# Patient Record
Sex: Male | Born: 1960 | Race: Black or African American | Hispanic: No | Marital: Single | Smoking: Never smoker
Health system: Southern US, Community
[De-identification: ages and names within clinical notes are randomized; demographics above are authoritative.]

## PROBLEM LIST (undated history)

## (undated) DIAGNOSIS — M545 Low back pain, unspecified: Secondary | ICD-10-CM

## (undated) DIAGNOSIS — I219 Acute myocardial infarction, unspecified: Secondary | ICD-10-CM

## (undated) DIAGNOSIS — R059 Cough, unspecified: Secondary | ICD-10-CM

## (undated) DIAGNOSIS — E785 Hyperlipidemia, unspecified: Secondary | ICD-10-CM

## (undated) DIAGNOSIS — K259 Gastric ulcer, unspecified as acute or chronic, without hemorrhage or perforation: Secondary | ICD-10-CM

## (undated) DIAGNOSIS — F32A Depression, unspecified: Secondary | ICD-10-CM

## (undated) DIAGNOSIS — R05 Cough: Secondary | ICD-10-CM

## (undated) DIAGNOSIS — E119 Type 2 diabetes mellitus without complications: Secondary | ICD-10-CM

## (undated) DIAGNOSIS — Z87898 Personal history of other specified conditions: Secondary | ICD-10-CM

## (undated) DIAGNOSIS — I509 Heart failure, unspecified: Secondary | ICD-10-CM

## (undated) DIAGNOSIS — Z973 Presence of spectacles and contact lenses: Secondary | ICD-10-CM

## (undated) DIAGNOSIS — I10 Essential (primary) hypertension: Secondary | ICD-10-CM

## (undated) DIAGNOSIS — I5030 Unspecified diastolic (congestive) heart failure: Secondary | ICD-10-CM

## (undated) DIAGNOSIS — I82409 Acute embolism and thrombosis of unspecified deep veins of unspecified lower extremity: Secondary | ICD-10-CM

## (undated) DIAGNOSIS — J45909 Unspecified asthma, uncomplicated: Secondary | ICD-10-CM

## (undated) DIAGNOSIS — F419 Anxiety disorder, unspecified: Secondary | ICD-10-CM

## (undated) DIAGNOSIS — E7800 Pure hypercholesterolemia, unspecified: Secondary | ICD-10-CM

## (undated) DIAGNOSIS — M069 Rheumatoid arthritis, unspecified: Secondary | ICD-10-CM

## (undated) DIAGNOSIS — G629 Polyneuropathy, unspecified: Secondary | ICD-10-CM

## (undated) DIAGNOSIS — T39395A Adverse effect of other nonsteroidal anti-inflammatory drugs [NSAID], initial encounter: Secondary | ICD-10-CM

## (undated) DIAGNOSIS — K279 Peptic ulcer, site unspecified, unspecified as acute or chronic, without hemorrhage or perforation: Secondary | ICD-10-CM

## (undated) DIAGNOSIS — G40409 Other generalized epilepsy and epileptic syndromes, not intractable, without status epilepticus: Secondary | ICD-10-CM

## (undated) DIAGNOSIS — K219 Gastro-esophageal reflux disease without esophagitis: Secondary | ICD-10-CM

## (undated) DIAGNOSIS — D649 Anemia, unspecified: Secondary | ICD-10-CM

## (undated) DIAGNOSIS — J449 Chronic obstructive pulmonary disease, unspecified: Secondary | ICD-10-CM

## (undated) DIAGNOSIS — M549 Dorsalgia, unspecified: Secondary | ICD-10-CM

## (undated) DIAGNOSIS — J3501 Chronic tonsillitis: Secondary | ICD-10-CM

## (undated) DIAGNOSIS — G8929 Other chronic pain: Secondary | ICD-10-CM

## (undated) DIAGNOSIS — G709 Myoneural disorder, unspecified: Secondary | ICD-10-CM

## (undated) DIAGNOSIS — R51 Headache: Secondary | ICD-10-CM

## (undated) DIAGNOSIS — M5020 Other cervical disc displacement, unspecified cervical region: Secondary | ICD-10-CM

## (undated) DIAGNOSIS — R350 Frequency of micturition: Secondary | ICD-10-CM

## (undated) DIAGNOSIS — F329 Major depressive disorder, single episode, unspecified: Secondary | ICD-10-CM

## (undated) DIAGNOSIS — S61411A Laceration without foreign body of right hand, initial encounter: Secondary | ICD-10-CM

## (undated) DIAGNOSIS — S61519A Laceration without foreign body of unspecified wrist, initial encounter: Secondary | ICD-10-CM

## (undated) DIAGNOSIS — R0602 Shortness of breath: Secondary | ICD-10-CM

## (undated) DIAGNOSIS — F319 Bipolar disorder, unspecified: Secondary | ICD-10-CM

## (undated) DIAGNOSIS — I2510 Atherosclerotic heart disease of native coronary artery without angina pectoris: Secondary | ICD-10-CM

## (undated) HISTORY — DX: Acute myocardial infarction, unspecified: I21.9

## (undated) HISTORY — DX: Laceration without foreign body of right hand, initial encounter: S61.411A

## (undated) HISTORY — PX: CARPAL TUNNEL RELEASE: SHX101

## (undated) HISTORY — DX: Laceration without foreign body of unspecified wrist, initial encounter: S61.519A

## (undated) HISTORY — DX: Peptic ulcer, site unspecified, unspecified as acute or chronic, without hemorrhage or perforation: K27.9

## (undated) HISTORY — DX: Other chronic pain: G89.29

## (undated) HISTORY — PX: MULTIPLE TOOTH EXTRACTIONS: SHX2053

## (undated) HISTORY — PX: FRACTURE SURGERY: SHX138

## (undated) HISTORY — PX: BACK SURGERY: SHX140

## (undated) HISTORY — PX: LUMBAR DISC SURGERY: SHX700

## (undated) HISTORY — DX: Dorsalgia, unspecified: M54.9

## (undated) HISTORY — DX: Chronic tonsillitis: J35.01

## (undated) HISTORY — PX: CARDIAC CATHETERIZATION: SHX172

---
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
## 1997-07-25 ENCOUNTER — Ambulatory Visit (HOSPITAL_COMMUNITY): Admission: RE | Admit: 1997-07-25 | Discharge: 1997-07-25 | Payer: Self-pay | Admitting: Neurosurgery

## 1997-08-08 ENCOUNTER — Emergency Department (HOSPITAL_COMMUNITY): Admission: EM | Admit: 1997-08-08 | Discharge: 1997-08-08 | Payer: Self-pay | Admitting: Emergency Medicine

## 1997-08-08 ENCOUNTER — Encounter: Admission: RE | Admit: 1997-08-08 | Discharge: 1997-11-06 | Payer: Self-pay | Admitting: Anesthesiology

## 1997-08-16 ENCOUNTER — Emergency Department (HOSPITAL_COMMUNITY): Admission: EM | Admit: 1997-08-16 | Discharge: 1997-08-16 | Payer: Self-pay | Admitting: Internal Medicine

## 1997-08-23 ENCOUNTER — Encounter (HOSPITAL_COMMUNITY): Admission: RE | Admit: 1997-08-23 | Discharge: 1997-08-23 | Payer: Self-pay | Admitting: Internal Medicine

## 2000-02-23 DIAGNOSIS — I2510 Atherosclerotic heart disease of native coronary artery without angina pectoris: Secondary | ICD-10-CM

## 2000-02-23 HISTORY — PX: CORONARY ARTERY BYPASS GRAFT: SHX141

## 2000-02-23 HISTORY — DX: Atherosclerotic heart disease of native coronary artery without angina pectoris: I25.10

## 2000-08-22 ENCOUNTER — Emergency Department (HOSPITAL_COMMUNITY): Admission: EM | Admit: 2000-08-22 | Discharge: 2000-08-23 | Payer: Self-pay | Admitting: *Deleted

## 2001-08-16 ENCOUNTER — Encounter: Payer: Self-pay | Admitting: Emergency Medicine

## 2001-08-16 ENCOUNTER — Emergency Department (HOSPITAL_COMMUNITY): Admission: EM | Admit: 2001-08-16 | Discharge: 2001-08-16 | Payer: Self-pay | Admitting: Emergency Medicine

## 2001-08-18 ENCOUNTER — Emergency Department (HOSPITAL_COMMUNITY): Admission: EM | Admit: 2001-08-18 | Discharge: 2001-08-19 | Payer: Self-pay | Admitting: Emergency Medicine

## 2001-08-27 ENCOUNTER — Inpatient Hospital Stay (HOSPITAL_COMMUNITY): Admission: EM | Admit: 2001-08-27 | Discharge: 2001-09-11 | Payer: Self-pay | Admitting: Emergency Medicine

## 2001-08-27 ENCOUNTER — Encounter: Payer: Self-pay | Admitting: Emergency Medicine

## 2001-08-28 ENCOUNTER — Encounter: Payer: Self-pay | Admitting: *Deleted

## 2001-08-31 ENCOUNTER — Encounter: Payer: Self-pay | Admitting: Cardiology

## 2001-09-01 ENCOUNTER — Encounter: Payer: Self-pay | Admitting: *Deleted

## 2001-09-06 ENCOUNTER — Encounter: Payer: Self-pay | Admitting: Thoracic Surgery (Cardiothoracic Vascular Surgery)

## 2001-09-07 ENCOUNTER — Encounter: Payer: Self-pay | Admitting: Thoracic Surgery (Cardiothoracic Vascular Surgery)

## 2001-09-08 ENCOUNTER — Encounter: Payer: Self-pay | Admitting: Thoracic Surgery (Cardiothoracic Vascular Surgery)

## 2001-09-22 ENCOUNTER — Encounter: Admission: RE | Admit: 2001-09-22 | Discharge: 2001-11-21 | Payer: Self-pay

## 2001-10-26 ENCOUNTER — Encounter: Payer: Self-pay | Admitting: Emergency Medicine

## 2001-10-26 ENCOUNTER — Inpatient Hospital Stay (HOSPITAL_COMMUNITY): Admission: EM | Admit: 2001-10-26 | Discharge: 2001-10-27 | Payer: Self-pay | Admitting: Emergency Medicine

## 2001-10-27 ENCOUNTER — Inpatient Hospital Stay (HOSPITAL_COMMUNITY): Admission: EM | Admit: 2001-10-27 | Discharge: 2001-11-14 | Payer: Self-pay | Admitting: Psychiatry

## 2001-10-31 ENCOUNTER — Encounter: Payer: Self-pay | Admitting: Emergency Medicine

## 2001-10-31 ENCOUNTER — Emergency Department (HOSPITAL_COMMUNITY): Admission: EM | Admit: 2001-10-31 | Discharge: 2001-10-31 | Payer: Self-pay | Admitting: Emergency Medicine

## 2001-11-22 ENCOUNTER — Encounter: Admission: RE | Admit: 2001-11-22 | Discharge: 2002-02-20 | Payer: Self-pay

## 2002-01-15 ENCOUNTER — Inpatient Hospital Stay (HOSPITAL_COMMUNITY): Admission: EM | Admit: 2002-01-15 | Discharge: 2002-01-16 | Payer: Self-pay | Admitting: Cardiology

## 2002-01-16 ENCOUNTER — Encounter: Payer: Self-pay | Admitting: Cardiology

## 2002-02-09 ENCOUNTER — Ambulatory Visit (HOSPITAL_COMMUNITY): Admission: RE | Admit: 2002-02-09 | Discharge: 2002-02-09 | Payer: Self-pay | Admitting: Neurosurgery

## 2002-02-09 ENCOUNTER — Encounter: Payer: Self-pay | Admitting: Neurosurgery

## 2002-04-11 ENCOUNTER — Encounter: Payer: Self-pay | Admitting: Neurosurgery

## 2002-04-16 ENCOUNTER — Encounter: Payer: Self-pay | Admitting: Neurosurgery

## 2002-04-16 ENCOUNTER — Inpatient Hospital Stay (HOSPITAL_COMMUNITY): Admission: RE | Admit: 2002-04-16 | Discharge: 2002-04-19 | Payer: Self-pay | Admitting: Neurosurgery

## 2002-05-02 ENCOUNTER — Emergency Department (HOSPITAL_COMMUNITY): Admission: EM | Admit: 2002-05-02 | Discharge: 2002-05-03 | Payer: Self-pay | Admitting: Emergency Medicine

## 2002-05-02 ENCOUNTER — Encounter: Payer: Self-pay | Admitting: Emergency Medicine

## 2002-05-02 ENCOUNTER — Emergency Department (HOSPITAL_COMMUNITY): Admission: EM | Admit: 2002-05-02 | Discharge: 2002-05-02 | Payer: Self-pay | Admitting: Emergency Medicine

## 2002-09-26 ENCOUNTER — Encounter: Payer: Self-pay | Admitting: Emergency Medicine

## 2002-09-26 ENCOUNTER — Emergency Department (HOSPITAL_COMMUNITY): Admission: EM | Admit: 2002-09-26 | Discharge: 2002-09-27 | Payer: Self-pay | Admitting: Emergency Medicine

## 2002-09-26 ENCOUNTER — Emergency Department (HOSPITAL_COMMUNITY): Admission: EM | Admit: 2002-09-26 | Discharge: 2002-09-26 | Payer: Self-pay | Admitting: Emergency Medicine

## 2002-09-28 ENCOUNTER — Inpatient Hospital Stay (HOSPITAL_COMMUNITY): Admission: EM | Admit: 2002-09-28 | Discharge: 2002-10-10 | Payer: Self-pay | Admitting: Psychiatry

## 2002-10-11 ENCOUNTER — Emergency Department (HOSPITAL_COMMUNITY): Admission: EM | Admit: 2002-10-11 | Discharge: 2002-10-11 | Payer: Self-pay | Admitting: Emergency Medicine

## 2002-10-13 ENCOUNTER — Emergency Department (HOSPITAL_COMMUNITY): Admission: EM | Admit: 2002-10-13 | Discharge: 2002-10-13 | Payer: Self-pay | Admitting: Emergency Medicine

## 2002-11-11 ENCOUNTER — Encounter: Payer: Self-pay | Admitting: Emergency Medicine

## 2002-11-12 ENCOUNTER — Inpatient Hospital Stay (HOSPITAL_COMMUNITY): Admission: EM | Admit: 2002-11-12 | Discharge: 2002-11-22 | Payer: Self-pay | Admitting: Psychiatry

## 2002-11-24 ENCOUNTER — Emergency Department (HOSPITAL_COMMUNITY): Admission: EM | Admit: 2002-11-24 | Discharge: 2002-11-25 | Payer: Self-pay | Admitting: Emergency Medicine

## 2002-11-25 ENCOUNTER — Encounter: Payer: Self-pay | Admitting: *Deleted

## 2002-11-25 ENCOUNTER — Emergency Department (HOSPITAL_COMMUNITY): Admission: EM | Admit: 2002-11-25 | Discharge: 2002-11-25 | Payer: Self-pay | Admitting: Emergency Medicine

## 2003-05-31 ENCOUNTER — Emergency Department (HOSPITAL_COMMUNITY): Admission: EM | Admit: 2003-05-31 | Discharge: 2003-05-31 | Payer: Self-pay | Admitting: Emergency Medicine

## 2003-06-22 ENCOUNTER — Emergency Department (HOSPITAL_COMMUNITY): Admission: EM | Admit: 2003-06-22 | Discharge: 2003-06-22 | Payer: Self-pay | Admitting: *Deleted

## 2003-06-27 ENCOUNTER — Ambulatory Visit (HOSPITAL_COMMUNITY): Admission: RE | Admit: 2003-06-27 | Discharge: 2003-06-27 | Payer: Self-pay | Admitting: Orthopaedic Surgery

## 2003-07-22 ENCOUNTER — Encounter (HOSPITAL_COMMUNITY): Admission: RE | Admit: 2003-07-22 | Discharge: 2003-08-21 | Payer: Self-pay | Admitting: Cardiology

## 2003-08-28 ENCOUNTER — Emergency Department (HOSPITAL_COMMUNITY): Admission: EM | Admit: 2003-08-28 | Discharge: 2003-08-28 | Payer: Self-pay | Admitting: *Deleted

## 2003-09-04 ENCOUNTER — Ambulatory Visit (HOSPITAL_COMMUNITY): Admission: RE | Admit: 2003-09-04 | Discharge: 2003-09-04 | Payer: Self-pay | Admitting: Cardiology

## 2004-01-04 ENCOUNTER — Inpatient Hospital Stay (HOSPITAL_COMMUNITY): Admission: EM | Admit: 2004-01-04 | Discharge: 2004-01-07 | Payer: Self-pay | Admitting: Emergency Medicine

## 2004-01-09 ENCOUNTER — Ambulatory Visit: Payer: Self-pay | Admitting: *Deleted

## 2004-01-09 ENCOUNTER — Inpatient Hospital Stay (HOSPITAL_COMMUNITY): Admission: EM | Admit: 2004-01-09 | Discharge: 2004-01-13 | Payer: Self-pay | Admitting: Emergency Medicine

## 2004-01-25 ENCOUNTER — Inpatient Hospital Stay (HOSPITAL_COMMUNITY): Admission: EM | Admit: 2004-01-25 | Discharge: 2004-01-27 | Payer: Self-pay | Admitting: Emergency Medicine

## 2004-02-02 ENCOUNTER — Emergency Department (HOSPITAL_COMMUNITY): Admission: EM | Admit: 2004-02-02 | Discharge: 2004-02-02 | Payer: Self-pay | Admitting: Emergency Medicine

## 2004-02-02 ENCOUNTER — Inpatient Hospital Stay (HOSPITAL_COMMUNITY): Admission: EM | Admit: 2004-02-02 | Discharge: 2004-02-05 | Payer: Self-pay | Admitting: Emergency Medicine

## 2004-02-02 ENCOUNTER — Ambulatory Visit: Payer: Self-pay | Admitting: Internal Medicine

## 2004-02-08 ENCOUNTER — Emergency Department (HOSPITAL_COMMUNITY): Admission: EM | Admit: 2004-02-08 | Discharge: 2004-02-08 | Payer: Self-pay | Admitting: Emergency Medicine

## 2004-02-11 ENCOUNTER — Encounter: Payer: Self-pay | Admitting: *Deleted

## 2004-02-11 ENCOUNTER — Emergency Department (HOSPITAL_COMMUNITY): Admission: EM | Admit: 2004-02-11 | Discharge: 2004-02-11 | Payer: Self-pay | Admitting: Emergency Medicine

## 2004-02-12 ENCOUNTER — Inpatient Hospital Stay (HOSPITAL_COMMUNITY): Admission: EM | Admit: 2004-02-12 | Discharge: 2004-02-15 | Payer: Self-pay | Admitting: Emergency Medicine

## 2004-02-14 ENCOUNTER — Ambulatory Visit: Payer: Self-pay | Admitting: Physical Medicine & Rehabilitation

## 2005-01-28 ENCOUNTER — Emergency Department (HOSPITAL_COMMUNITY): Admission: EM | Admit: 2005-01-28 | Discharge: 2005-01-28 | Payer: Self-pay | Admitting: Emergency Medicine

## 2005-09-17 ENCOUNTER — Inpatient Hospital Stay (HOSPITAL_COMMUNITY): Admission: EM | Admit: 2005-09-17 | Discharge: 2005-10-12 | Payer: Self-pay | Admitting: Psychiatry

## 2005-09-17 ENCOUNTER — Encounter: Payer: Self-pay | Admitting: Emergency Medicine

## 2005-09-18 ENCOUNTER — Ambulatory Visit: Payer: Self-pay | Admitting: Psychiatry

## 2005-09-30 ENCOUNTER — Ambulatory Visit: Payer: Self-pay | Admitting: Internal Medicine

## 2005-09-30 ENCOUNTER — Inpatient Hospital Stay (HOSPITAL_COMMUNITY): Admission: EM | Admit: 2005-09-30 | Discharge: 2005-10-02 | Payer: Self-pay | Admitting: Emergency Medicine

## 2005-10-18 ENCOUNTER — Emergency Department (HOSPITAL_COMMUNITY): Admission: EM | Admit: 2005-10-18 | Discharge: 2005-10-18 | Payer: Self-pay | Admitting: Emergency Medicine

## 2006-06-03 ENCOUNTER — Ambulatory Visit (HOSPITAL_COMMUNITY): Admission: RE | Admit: 2006-06-03 | Discharge: 2006-06-03 | Payer: Self-pay | Admitting: Family Medicine

## 2006-12-28 ENCOUNTER — Ambulatory Visit: Payer: Self-pay | Admitting: Cardiology

## 2007-01-25 ENCOUNTER — Inpatient Hospital Stay: Payer: Self-pay | Admitting: Unknown Physician Specialty

## 2007-02-03 ENCOUNTER — Other Ambulatory Visit: Payer: Self-pay

## 2007-02-08 ENCOUNTER — Other Ambulatory Visit: Payer: Self-pay

## 2007-03-02 ENCOUNTER — Ambulatory Visit: Payer: Self-pay | Admitting: Unknown Physician Specialty

## 2007-03-28 ENCOUNTER — Other Ambulatory Visit: Payer: Self-pay

## 2007-03-28 ENCOUNTER — Inpatient Hospital Stay: Payer: Self-pay | Admitting: Unknown Physician Specialty

## 2007-07-30 ENCOUNTER — Emergency Department: Payer: Self-pay | Admitting: Emergency Medicine

## 2007-08-25 ENCOUNTER — Other Ambulatory Visit: Payer: Self-pay

## 2007-08-25 ENCOUNTER — Inpatient Hospital Stay: Payer: Self-pay | Admitting: Psychiatry

## 2007-09-06 ENCOUNTER — Encounter: Payer: Self-pay | Admitting: Emergency Medicine

## 2007-09-06 ENCOUNTER — Ambulatory Visit: Payer: Self-pay | Admitting: Psychiatry

## 2007-09-07 ENCOUNTER — Inpatient Hospital Stay (HOSPITAL_COMMUNITY): Admission: RE | Admit: 2007-09-07 | Discharge: 2007-09-25 | Payer: Self-pay | Admitting: Psychiatry

## 2007-09-26 ENCOUNTER — Inpatient Hospital Stay (HOSPITAL_COMMUNITY): Admission: EM | Admit: 2007-09-26 | Discharge: 2007-09-27 | Payer: Self-pay | Admitting: Emergency Medicine

## 2007-09-26 ENCOUNTER — Ambulatory Visit: Payer: Self-pay | Admitting: Cardiology

## 2007-10-29 ENCOUNTER — Ambulatory Visit: Payer: Self-pay | Admitting: Psychiatry

## 2007-10-29 ENCOUNTER — Other Ambulatory Visit: Payer: Self-pay

## 2007-10-29 ENCOUNTER — Inpatient Hospital Stay (HOSPITAL_COMMUNITY): Admission: EM | Admit: 2007-10-29 | Discharge: 2007-11-23 | Payer: Self-pay | Admitting: Psychiatry

## 2007-11-24 ENCOUNTER — Observation Stay (HOSPITAL_COMMUNITY): Admission: EM | Admit: 2007-11-24 | Discharge: 2007-11-27 | Payer: Self-pay | Admitting: Emergency Medicine

## 2007-11-27 ENCOUNTER — Inpatient Hospital Stay (HOSPITAL_COMMUNITY): Admission: EM | Admit: 2007-11-27 | Discharge: 2007-12-13 | Payer: Self-pay | Admitting: *Deleted

## 2007-11-27 ENCOUNTER — Ambulatory Visit: Payer: Self-pay | Admitting: *Deleted

## 2007-12-01 ENCOUNTER — Encounter: Payer: Self-pay | Admitting: *Deleted

## 2007-12-27 ENCOUNTER — Ambulatory Visit: Payer: Self-pay | Admitting: Family Medicine

## 2007-12-27 ENCOUNTER — Inpatient Hospital Stay (HOSPITAL_COMMUNITY): Admission: EM | Admit: 2007-12-27 | Discharge: 2007-12-28 | Payer: Self-pay | Admitting: Emergency Medicine

## 2007-12-28 ENCOUNTER — Ambulatory Visit: Payer: Self-pay | Admitting: Psychiatry

## 2007-12-28 ENCOUNTER — Inpatient Hospital Stay (HOSPITAL_COMMUNITY): Admission: RE | Admit: 2007-12-28 | Discharge: 2008-01-03 | Payer: Self-pay | Admitting: Psychiatry

## 2008-06-20 ENCOUNTER — Ambulatory Visit (HOSPITAL_COMMUNITY): Admission: RE | Admit: 2008-06-20 | Discharge: 2008-06-20 | Payer: Self-pay | Admitting: Family Medicine

## 2008-09-23 ENCOUNTER — Ambulatory Visit (HOSPITAL_COMMUNITY): Admission: RE | Admit: 2008-09-23 | Discharge: 2008-09-23 | Payer: Self-pay | Admitting: Family Medicine

## 2008-10-23 ENCOUNTER — Ambulatory Visit: Payer: Self-pay | Admitting: Cardiology

## 2008-10-23 ENCOUNTER — Inpatient Hospital Stay (HOSPITAL_COMMUNITY): Admission: AD | Admit: 2008-10-23 | Discharge: 2008-10-25 | Payer: Self-pay | Admitting: Cardiology

## 2008-11-08 ENCOUNTER — Other Ambulatory Visit: Payer: Self-pay | Admitting: Emergency Medicine

## 2008-11-08 ENCOUNTER — Ambulatory Visit: Payer: Self-pay | Admitting: Psychiatry

## 2008-11-09 ENCOUNTER — Inpatient Hospital Stay (HOSPITAL_COMMUNITY): Admission: RE | Admit: 2008-11-09 | Discharge: 2008-12-02 | Payer: Self-pay | Admitting: Psychiatry

## 2009-02-28 ENCOUNTER — Encounter: Payer: Self-pay | Admitting: Orthopedic Surgery

## 2009-02-28 ENCOUNTER — Ambulatory Visit (HOSPITAL_COMMUNITY): Admission: RE | Admit: 2009-02-28 | Discharge: 2009-02-28 | Payer: Self-pay | Admitting: Family Medicine

## 2009-05-05 ENCOUNTER — Ambulatory Visit: Payer: Self-pay | Admitting: Orthopedic Surgery

## 2009-05-05 DIAGNOSIS — M7580 Other shoulder lesions, unspecified shoulder: Secondary | ICD-10-CM

## 2009-05-14 ENCOUNTER — Encounter: Payer: Self-pay | Admitting: Orthopedic Surgery

## 2009-05-14 ENCOUNTER — Encounter (HOSPITAL_COMMUNITY): Admission: RE | Admit: 2009-05-14 | Discharge: 2009-06-20 | Payer: Self-pay | Admitting: Orthopedic Surgery

## 2009-06-16 ENCOUNTER — Encounter: Payer: Self-pay | Admitting: Orthopedic Surgery

## 2009-06-23 ENCOUNTER — Encounter (HOSPITAL_COMMUNITY): Admission: RE | Admit: 2009-06-23 | Discharge: 2009-07-23 | Payer: Self-pay | Admitting: Orthopedic Surgery

## 2009-07-14 ENCOUNTER — Ambulatory Visit: Payer: Self-pay | Admitting: Orthopedic Surgery

## 2009-07-14 DIAGNOSIS — G5600 Carpal tunnel syndrome, unspecified upper limb: Secondary | ICD-10-CM

## 2009-07-16 ENCOUNTER — Telehealth: Payer: Self-pay | Admitting: Orthopedic Surgery

## 2009-07-16 ENCOUNTER — Ambulatory Visit (HOSPITAL_COMMUNITY): Admission: RE | Admit: 2009-07-16 | Discharge: 2009-07-16 | Payer: Self-pay | Admitting: Orthopedic Surgery

## 2009-07-23 ENCOUNTER — Telehealth: Payer: Self-pay | Admitting: Orthopedic Surgery

## 2009-08-04 ENCOUNTER — Encounter: Payer: Self-pay | Admitting: Orthopedic Surgery

## 2009-08-06 ENCOUNTER — Telehealth: Payer: Self-pay | Admitting: Orthopedic Surgery

## 2009-08-12 ENCOUNTER — Encounter: Payer: Self-pay | Admitting: Orthopedic Surgery

## 2009-08-28 ENCOUNTER — Ambulatory Visit: Payer: Self-pay | Admitting: Orthopedic Surgery

## 2009-11-24 ENCOUNTER — Ambulatory Visit: Payer: Self-pay | Admitting: Orthopedic Surgery

## 2009-12-03 ENCOUNTER — Encounter (HOSPITAL_COMMUNITY)
Admission: RE | Admit: 2009-12-03 | Discharge: 2010-01-02 | Payer: Self-pay | Source: Home / Self Care | Admitting: Orthopedic Surgery

## 2009-12-10 ENCOUNTER — Encounter: Payer: Self-pay | Admitting: Orthopedic Surgery

## 2009-12-25 ENCOUNTER — Telehealth: Payer: Self-pay | Admitting: Orthopedic Surgery

## 2009-12-29 ENCOUNTER — Telehealth: Payer: Self-pay | Admitting: Orthopedic Surgery

## 2009-12-30 ENCOUNTER — Encounter: Payer: Self-pay | Admitting: Orthopedic Surgery

## 2010-01-01 ENCOUNTER — Telehealth: Payer: Self-pay | Admitting: Orthopedic Surgery

## 2010-01-02 ENCOUNTER — Ambulatory Visit (HOSPITAL_COMMUNITY)
Admission: RE | Admit: 2010-01-02 | Discharge: 2010-01-02 | Payer: Self-pay | Admitting: Physical Medicine and Rehabilitation

## 2010-02-09 ENCOUNTER — Telehealth: Payer: Self-pay | Admitting: Orthopedic Surgery

## 2010-03-26 NOTE — Progress Notes (Signed)
Summary: wants referral to pain management clinic  Phone Note Call from Patient   Summary of Call: Dalton [name] (02/26/2060) left a message that he is in rehab for his shoulder, but ask if you will refer him to a pain management clinic. His # [phone] Initial call taken by: Jacklynn Ganong,  December 25, 2009 2:47 PM  Follow-up for Phone Call        refer to pain management Dr Eduard Clos Follow-up by: Fuller Canada MD,  December 29, 2009 7:42 AM  Additional Follow-up for Phone Call Additional follow up Details #1::        Routed to [facility] for referral Additional Follow-up by: Jacklynn Ganong,  December 29, 2009 9:17 AM

## 2010-03-26 NOTE — Progress Notes (Signed)
Summary: getting Narcs from 2 different dr's  Phone Note Other Incoming Call back at [employer]   Summary of Call: pt getting Hydrocodone 7.5 from [place], getting Hydrocodone 7.5/500 from [facility] at the same time, also has Norco 5 rx open also  just FYI Initial call taken by: Ether Griffins,  August 06, 2009 10:57 AM  Follow-up for Phone Call        when he gets the ncs I will see him in follow up;  no meds or appts until then    Follow-up by: Fuller Canada MD,  August 06, 2009 11:10 AM  Additional Follow-up for Phone Call Additional follow up Details #1::        ok Additional Follow-up by: Ether Griffins,  August 06, 2009 11:18 AM    Additional Follow-up for Phone Call Additional follow up Details #2::    ok, thank you. Follow-up by: Cammie Sickle,  August 06, 2009 12:19 PM

## 2010-03-26 NOTE — Assessment & Plan Note (Signed)
Summary: NCS RESULTS   Visit Type:  Follow-up Referring Provider:  Dr. Janna Arch Primary Provider:  Dr. Janna Arch  CC:  ncs results uppers.  History of Present Illness: I saw Dalton [name] in the office today for a 2 month followup visit.  He is a 50 years old man with the complaint of:  left shoulder pain   Recheck after PT and injection.  could not do therapy   IMAGING: XRAYS NO ABNORMALITIES   Meds: HCTZ, Protonix, Lipitor, Ventolin inhaler, Amlodipine, Metoprolol, Leoetiracetams, Isosorbide, Plavix, Norco 7.5 for pain, helped some, not much for the pain.  NCS results for review from 08/04/09 by Dr. Gerilyn Pilgrim.  07/06/09 MRI left shoulder  IMPRESSION:   1.  No significant findings for bony impingement. 2.  Mild to moderate rotator cuff tendinopathy/tendinosis.  Shallow articular surface tear at the attachment region of the infraspinatus tendon without full-thickness tear    Allergies: 1)  ! Ibuprofen 2)  ! Neurontin 3)  ! * Ambien 4)  ! Naprosyn  Past History:  Past medical, surgical, family and social histories (including risk factors) reviewed, and no changes noted (except as noted below).  Past Medical History: Reviewed history from 05/05/2009 and no changes required. htn reflux cholesterol asthma congestive heart arthritis  Past Surgical History: Reviewed history from 05/05/2009 and no changes required. heart l spine knee hand  Family History: Reviewed history from 05/05/2009 and no changes required. Family History of Diabetes Family History Coronary Heart Disease male < 29 Family History of Arthritis  Social History: Reviewed history from 05/05/2009 and no changes required. Patient is single.  disabled no smoking no alcohol no caffeine   Impression & Recommendations:  Problem # 1:  CARPAL TUNNEL SYNDROME (ICD-354.0) Assessment New  The patient has no surgical lesion at this time he has some mild carpal tunnel findings on the nerve  conduction study and a sensory neuropathy which is best handled medically.  Currently recommend gabapentin titrated up to 400 mg t.i.d. if needed  His LEFT shoulder has tendinopathy and tendinitis with no surgical tear recommend oral pain management  Patient is returned to your care for further management.  Certainly if he injured his shoulder or his pain becomes worse would recommend reevaluation  Orders: Est. 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 Patient Level III (98119)  Medications Added to Medication List This Visit: 1)  Neurontin 100 Mg Caps (Gabapentin) .[name] Kitchen.. 1 by mouth three times a day 2)  Norco 7.5-325 Mg Tabs (Hydrocodone-acetaminophen) .[name] Kitchen.. 1 by mouth q 4 prn pain  Patient Instructions: 1)  You have 2 problems  2)  1. nerve pincehed left arm and  3)  2. Tendonitis in left shoulder  4)  Neither problem needs surgery  5)  Start gabapentin 100 mg three times a day  6)  norco 7.5 mg ev 4 hrs for pain in your shoulder after this prescription Dr Delbert Harness will write the rest  7)  Please schedule a follow-up appointment as needed. Prescriptions: NORCO 7.5-325 MG TABS (HYDROCODONE-ACETAMINOPHEN) 1 by mouth q 4 prn pain  #84 x 5   Entered and Authorized by:   Fuller Canada MD   Signed by:   Fuller Canada MD on 08/28/2009   Method used:   Print then Give to Patient   RxID:   [id number] NEURONTIN 100 MG CAPS (GABAPENTIN) 1 by mouth three times a day  #42 x 5   Entered  and Authorized by:   Fuller Canada MD   Signed by:   Fuller Canada MD on 08/28/2009   Method used:   Print then Give to Patient   RxID:   [id number]

## 2010-03-26 NOTE — Miscellaneous (Signed)
Summary: OT clinical evaluation  OT clinical evaluation   Imported By: Jacklynn Ganong 12/10/2009 13:43:05  _____________________________________________________________________  External Attachment:    Type:   Image     Comment:   External Document

## 2010-03-26 NOTE — Progress Notes (Signed)
Summary: patient requests refill on pain medication  Phone Note Call from Patient   Caller: Patient Summary of Call: Patient stopped in to request refill on Norco/Hydocodone 7.5, said Rx "runs out today."  States has seen Dr Scot Jun, as per Dr Mort Sawyers referral and due back there later in December. (We have not received Dr Ozzie Hoyle report from November consultation - called to request copy.)  Patient's pharmacy is [facility] in [place].  His ph#'s are Home [phone] or Cell [phone] Initial call taken by: Cammie Sickle,  February 09, 2010 9:29 AM  Follow-up for Phone Call        Dr. Eduard Clos will have to fill rx, since he is in with their office for pain management. Follow-up by: Ether Griffins,  February 09, 2010 11:55 AM  Additional Follow-up for Phone Call Additional follow up Details #1::        Advised patient.    Additional Follow-up by: Cammie Sickle,  February 09, 2010 1:10 PM

## 2010-03-26 NOTE — Progress Notes (Signed)
Summary: Referral to Dr. Eduard Clos for pain management.  Phone Note Outgoing Call   Call placed by: Waldon Reining,  December 29, 2009 10:45 AM Call placed to: Specialist Action Taken: Information Sent Summary of Call: I faxed a referral for this patient to Dr. Eduard Clos for pain management, as requested by the patient.

## 2010-03-26 NOTE — Medication Information (Signed)
Summary: Tax adviser   Imported By: Cammie Sickle 12/13/2009 19:12:31  _____________________________________________________________________  External Attachment:    Type:   Image     Comment:   External Document

## 2010-03-26 NOTE — Assessment & Plan Note (Signed)
Summary: left shoulder still hurting/mcr/mcd/dondiego/bsf   Visit Type:  Follow-up Referring Provider:  Dr. Janna Arch Primary Provider:  Dr. Janna Arch  CC:  left shoulder pain.  History of Present Illness: I saw Dalton [name] in the office today for a 2 month followup visit.  He is a 50 years old man with the complaint of:  left shoulder pain   he was previously treated with physical therapy and a subacromial injection although he didn't go to therapy because of increased pain His plain film showed no abnormalities he eventually had an MRI on May 15 of this year which showed no findings of bony impingement, mild to moderate rotator cuff tendinopathy with shallow articular surface tear in the region of the infraspinatus tendon.  We gave him some gabapentin 100 mg t.i.d. and Lorcet 7.5 mg which he ran out of 10 and his primary care physician gave him 10 mg of Percocet which he says helps him.  Meds: HCTZ, Protonix, Lipitor, Ventolin inhaler, Amlodipine, Metoprolol, Leoetiracetams, Isosorbide, Plavix, Norco 7.5 for pain, helped some, Percocet 10 given from PCP helped, ran out.  NCS results for review from 08/04/09 by Dr. Gerilyn Pilgrim  Has pain with popping sensation, no new injuries.     Allergies: 1)  ! Ibuprofen 2)  ! Neurontin 3)  ! * Ambien 4)  ! Naprosyn  Past History:  Past Medical History: Last updated: 05/05/2009 htn reflux cholesterol asthma congestive heart arthritis  Past Surgical History: Last updated: 05/05/2009 heart l spine knee hand  Family History: Last updated: 05/05/2009 Family History of Diabetes Family History Coronary Heart Disease male < 20 Family History of Arthritis  Social History: Last updated: 05/05/2009 Patient is single.  disabled no smoking no alcohol no caffeine  Review of Systems       musculoskeletal he complains of weakness in the LEFT upper extremity.  He had some numbness in his upper extremity had a nerve conduction  study which showed some mild carpal tunnel syndrome symptoms but that doesn't seem to be bothering him  Physical Exam  Additional Exam:  exam his overall appearance is normal although he appears older than his stated age  Has normal radial and ulnar pulse and no peripheral edema in his LEFT upper extremity are no axillary lymph nodes or cervical lymph nodes on the LEFT side  His skin is clean dry and intact no rash or ulceration in the LEFT upper extremity or neck area  Sensory-wise is normal soft touch sensation around the LEFT shoulder in a C5 distribution.  He is awake alert and oriented x3 his mood and affect is normal  As his shoulder goes he has normal passive range of motion with some crepitance.  There is no palpable tenderness over the a.c. joint posterior subacromial space or anterolateral acromion or deltoid.  He's has some weakness in the supraspinatus tendon.  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  He doesn't have a bone spur.  He doesn't have a full-thickness rotator cuff tear.  He is also not completed physical therapy.  It is unlikely that surgery will improve his situation.  He needs to rehabilitation the shoulder.  I took him through range of motion which he should be able to tolerate physical therapy.  Recommend continue Norco for pain do not take  Percocet 10 mg as a long-term solution for his shoulder pain.  I have gone over this with him at length and told him there is no surgical lesion for me to treat so he has to rehabilitation.  Orders: Physical Therapy Referral (PT) Est. Patient Level III (81191)  Patient Instructions: 1)  I have asked him to start physical therapy again.  He should come back as needed.  He can get Percocet 10 mg for Dr. Janna Arch if he wants but I've  advised him to take Norco 7.5 and gave him a prescription for one tablet q.4 #84 RIGHT 5 refills which should last him 3 months

## 2010-03-26 NOTE — Letter (Signed)
Summary: History form  History form   Imported By: Jacklynn Ganong 05/07/2009 08:21:09  _____________________________________________________________________  External Attachment:    Type:   Image     Comment:   External Document

## 2010-03-26 NOTE — Miscellaneous (Signed)
Summary: Rehab discharge  Rehab discharge   Imported By: Jacklynn Ganong 08/19/2009 11:51:58  _____________________________________________________________________  External Attachment:    Type:   Image     Comment:   External Document

## 2010-03-26 NOTE — Miscellaneous (Signed)
Summary: OT Progress note  OT Progress note   Imported By: Jacklynn Ganong 06/24/2009 12:06:12  _____________________________________________________________________  External Attachment:    Type:   Image     Comment:   External Document

## 2010-03-26 NOTE — Assessment & Plan Note (Signed)
Summary: LT SHOULDER/HAD XR [facility]/REF [place]/MEDICARE,MEDICAI/CAF   Vital Signs:  Patient profile:   50 year old male Height:      71 inches Weight:      277 pounds Pulse rate:   78 / minute Resp:     18 per minute  Vitals Entered By: Fuller Canada MD (May 05, 2009 4:27 PM)  Visit Type:  Initial Consult Referring Provider:  Dr. Janna Arch Primary Provider:  Dr. Janna Arch  CC:  left shoulder pain.  History of Present Illness: 50 year old male with LEFT shoulder pain for one year with no injury.  He reports he does not get relief with Relafen.  He has taken some Vicodin which has seemed to help.  He describes his pain as a 10 out of 10, sharp dull throbbing stabbing burning constant morning night and after exercise.  His symptoms started gradually is better with rest and worse with lifting.  He reports some bruising some numbness some swelling some locking some tingling in the LEFT upper arm.  Symptoms are primarily over the shoulder chest and upper part of his arm above the elbow.  He was referred to [place] by Dr. Delbert Harness  Xrays [facility] 02/28/09 left shoulder. review of these x-rays show that there is no obvious abnormality.  He reports indicate no acute abnormalities.  Meds: HCTZ, Protonix, Lipitor, Ventolin inhaler, Amlodipine, Metoprolol, Leoetiracetams, Relafen, Isosorbide, Plavix.    Allergies (verified): 1)  ! Ibuprofen 2)  ! Neurontin 3)  ! * Ambien 4)  ! Naprosyn  Past History:  Past Medical History: htn reflux cholesterol asthma congestive heart arthritis  Past Surgical History: heart l spine knee hand  Family History: Family History of Diabetes Family History Coronary Heart Disease male < 32 Family History of Arthritis  Social History: Patient is single.  disabled no smoking no alcohol no caffeine  Review of Systems Constitutional:  Complains of weight gain, fever, and chills; denies weight loss and fatigue. Cardiovascular:  Complains of chest  pain; denies palpitations, fainting, and murmurs. Respiratory:  Complains of short of breath, wheezing, couch, and tightness; denies pain on inspiration and snoring . Gastrointestinal:  Complains of heartburn; denies vomiting, diarrhea, constipation, and blood in your stools. Genitourinary:  Denies frequency, urgency, difficulty urinating, painful urination, flank pain, and bleeding in urine. Neurologic:  Complains of numbness, tingling, dizziness, and seizure; denies unsteady gait and tremors. Musculoskeletal:  Complains of joint pain, swelling, and muscle pain; denies instability, stiffness, redness, and heat. Endocrine:  Denies excessive thirst, exessive urination, and heat or cold intolerance. Psychiatric:  Complains of nervousness, depression, and anxiety; denies hallucinations. Skin:  Complains of itching; denies changes in the skin, poor healing, rash, and redness. HEENT:  Complains of blurred or double vision, eye pain, and redness; denies watering; headache. Immunology:  Complains of seasonal allergies; denies sinus problems and allergic to bee stings. Hemoatologic:  Denies easy bleeding and brusing.  Physical Exam  Cervical Nodes:  no significant adenopathy Axillary Nodes:  no significant adenopathy Psych:  alert and cooperative; normal mood and affect; normal attention span and concentration   Shoulder/Elbow Exam  General:    Well-developed, well-nourished, normal body habitus; no deformities, normal grooming.    Vital signs are as recorded  Skin:    skin of his LEFT arm shoulder chest back cervical and thoracic spine is normal  Vascular:    The pulses and perfusion were normal with normal color, temperature  and no swelling in the upper extremities  Sensory:  Gross sensation intact in the upper extremities.    Motor:    he does have some weakness in the supraspinatus tendon of the internal and external rotators the LEFT shoulder normal, his shoulder is otherwise  stable  His RIGHT upper extremity is normal in terms of inspection no swelling, range of motion full, motor exam normal, shoulder joint is stable.  Reflexes:    Normal reflexes in the upper extremities.    Shoulder Exam:    he does have a positive impingement sign with Hawkins maneuver and Neer test.     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 There were no complications  Orders: Physical Therapy Referral (PT) New Patient Level IV (16109) Depo- Medrol 40mg  (J1030) Joint Aspirate / Injection, Large (20610)  Patient Instructions: 1)  You have received an injection of cortisone today. You may experience increased pain at the injection site. Apply ice pack to the area for 20 minutes every 2 hours and take 2 xtra strength tylenol every 8 hours. This increased pain will usually resolve in 24 hours. The injection will take effect in 3-10 days.  2)  Please schedule a follow-up appointment in 2 months.

## 2010-03-26 NOTE — Progress Notes (Signed)
Summary: Referral to Dr. Gerilyn Pilgrim for a NCS.  Phone Note Outgoing Call   Call placed by: Waldon Reining,  Jul 16, 2009 4:29 PM Call placed to: Specialist Action Taken: Information Sent Summary of Call: I faxed a referral for this patient to Dr. Gerilyn Pilgrim for a NCS of upper extremities for CTS.

## 2010-03-26 NOTE — Letter (Signed)
Summary: *Orthopedic Consult Note  [facility]  [address] [place] [address]  [place], [place] [address]   Phone: [phone]  Fax: [phone]    Re:    WHITNEY BINGAMAN DOB:    [birth year]   Dear: Dr don Cecelia Byars    Thank you for requesting that we see the above patient for consultation.  A copy of the detailed office note will be sent under separate cover, for your review.  We reevaluated the patient today and looked at his MRI again which shows tendinitis and tendinopathy without full-thickness tear, in fact there is a small shallow articular surface defect which may be consistent with a partial tear.  This should be treated with physical therapy first.  I am of the opinion that surgery would not help this problem especially in the setting of his disability, severe pain despite minimal MRI findings and imaging studies which do not show a full-thickness tear or even a severe partial thickness tear.  We did read prescribed his Norco 7.5 mg for pain I would not go higher than this.  We re ordered his physical therapy.      Thank you for this opportunity to look after your patient.  Sincerely,   Terrance Mass. MD.

## 2010-03-26 NOTE — Medication Information (Signed)
Summary: Tax adviser   Imported By: Cammie Sickle 06/14/2009 11:02:45  _____________________________________________________________________  External Attachment:    Type:   Image     Comment:   External Document

## 2010-03-26 NOTE — Progress Notes (Signed)
Summary: Appointment with Dr. Eduard Clos.  Phone Note From Other Clinic   Caller: Referral Coordinator Reason for Call: Schedule Patient Appt Summary of Call: Patient has an appointment with Dr. Eduard Clos on 12-31-09 at 9:00. Patient is aware of this appointment. Initial call taken by: Waldon Reining,  January 01, 2010 12:33 PM

## 2010-03-26 NOTE — Assessment & Plan Note (Signed)
Summary: 2 M RE-CK LT SHOULDER FOL'G PT/MEDICARE,MEDICAID/CAF   Visit Type:  Follow-up Referring Provider:  Dr. Janna Arch Primary Provider:  Dr. Janna Arch  CC:  left shoulder pain.  History of Present Illness: I saw Dalton [name] in the office today for a 2 month followup visit.  He is a 50 years old man with the complaint of:  left shoulder.  Recheck after PT and injection.  could not do therapy   IMAGING: XRAYS NO ABNORMALITIES   Meds: HCTZ, Protonix, Lipitor, Ventolin inhaler, Amlodipine, Metoprolol, Leoetiracetams, Relafen, Isosorbide, Plavix.  The patient has not been able to tolerate physical therapy he says when he places his arm in extension he has pain in the front of his chest wall near the coracoid also has pain with forward elevation and weakness  Recommend MRI and nerve conduction study to address his symptoms of weakness in his LEFT hand and inability to hold onto small objects  Allergies: 1)  ! Ibuprofen 2)  ! Neurontin 3)  ! * Ambien 4)  ! 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 Patient Level II (16109)  Problem # 2:  CARPAL TUNNEL SYNDROME (ICD-354.0) Assessment: New  Orders: Est. Patient Level II (60454)  Medications Added to Medication List This Visit: 1)  Norco 7.5-325 Mg Tabs (Hydrocodone-acetaminophen) .[name] Kitchen.. 1 q 4 as needed pain  Patient Instructions: 1)  MRI LT SHOULDER  2)  NCS DONE ON UPPER EXTREMITIES FOR CTS  Prescriptions: NORCO 7.5-325 MG TABS (HYDROCODONE-ACETAMINOPHEN) 1 q 4 as needed pain  #60 x 2   Entered and Authorized by:   Fuller Canada MD   Signed by:   Fuller Canada MD on 07/14/2009   Method used:   Print then Give to Patient   RxID:   [id number]

## 2010-03-26 NOTE — Progress Notes (Signed)
Summary: no show for ncs  Phone Note Other Incoming   Summary of Call: Dr. Ronal Fear office left a message that Dalton [name] was a no show for Nerve Conduction Study scheduled for today. Initial call taken by: Jacklynn Ganong,  July 23, 2009 10:51 AM

## 2010-03-26 NOTE — Miscellaneous (Signed)
Summary: OT discharge summary  OT discharge summary   Imported By: Jacklynn Ganong 12/30/2009 16:16:04  _____________________________________________________________________  External Attachment:    Type:   Image     Comment:   External Document

## 2010-03-26 NOTE — Miscellaneous (Signed)
Summary: OT Clinical evaluation  OT Clinical evaluation   Imported By: Jacklynn Ganong 05/29/2009 08:15:34  _____________________________________________________________________  External Attachment:    Type:   Image     Comment:   External Document

## 2010-04-07 ENCOUNTER — Other Ambulatory Visit: Payer: Self-pay | Admitting: Neurosurgery

## 2010-04-07 DIAGNOSIS — M545 Low back pain, unspecified: Secondary | ICD-10-CM

## 2010-04-15 ENCOUNTER — Ambulatory Visit
Admission: RE | Admit: 2010-04-15 | Discharge: 2010-04-15 | Disposition: A | Discharge status: Home / Self Care | Payer: Medicare Other | Source: Ambulatory Visit | Attending: Neurosurgery | Admitting: Neurosurgery

## 2010-04-15 DIAGNOSIS — M545 Low back pain, unspecified: Secondary | ICD-10-CM

## 2010-04-17 ENCOUNTER — Emergency Department (HOSPITAL_COMMUNITY): Payer: Medicare Other

## 2010-04-17 ENCOUNTER — Inpatient Hospital Stay (HOSPITAL_COMMUNITY)
Admission: EM | Admit: 2010-04-17 | Discharge: 2010-04-18 | DRG: 683 | Disposition: A | Discharge status: Home / Self Care | Payer: Medicare Other | Attending: Family Medicine | Admitting: Family Medicine

## 2010-04-17 DIAGNOSIS — G40909 Epilepsy, unspecified, not intractable, without status epilepticus: Secondary | ICD-10-CM | POA: Diagnosis present

## 2010-04-17 DIAGNOSIS — I5022 Chronic systolic (congestive) heart failure: Secondary | ICD-10-CM | POA: Diagnosis present

## 2010-04-17 DIAGNOSIS — R0789 Other chest pain: Secondary | ICD-10-CM | POA: Diagnosis present

## 2010-04-17 DIAGNOSIS — N179 Acute kidney failure, unspecified: Principal | ICD-10-CM | POA: Diagnosis present

## 2010-04-17 DIAGNOSIS — I509 Heart failure, unspecified: Secondary | ICD-10-CM | POA: Diagnosis present

## 2010-04-17 DIAGNOSIS — E860 Dehydration: Secondary | ICD-10-CM | POA: Diagnosis present

## 2010-04-17 DIAGNOSIS — A088 Other specified intestinal infections: Secondary | ICD-10-CM | POA: Diagnosis present

## 2010-04-17 DIAGNOSIS — F1410 Cocaine abuse, uncomplicated: Secondary | ICD-10-CM | POA: Diagnosis present

## 2010-04-17 DIAGNOSIS — I2510 Atherosclerotic heart disease of native coronary artery without angina pectoris: Secondary | ICD-10-CM | POA: Diagnosis present

## 2010-04-17 DIAGNOSIS — Z951 Presence of aortocoronary bypass graft: Secondary | ICD-10-CM

## 2010-04-17 LAB — URINALYSIS, ROUTINE W REFLEX MICROSCOPIC
Bilirubin Urine: NEGATIVE
Hgb urine dipstick: NEGATIVE
Ketones, ur: NEGATIVE mg/dL
Protein, ur: NEGATIVE mg/dL
Specific Gravity, Urine: 1.01 (ref 1.005–1.030)
Urobilinogen, UA: 0.2 mg/dL (ref 0.0–1.0)

## 2010-04-17 LAB — BASIC METABOLIC PANEL
BUN: 42 mg/dL — ABNORMAL HIGH (ref 6–23)
CO2: 26 mEq/L (ref 19–32)
Calcium: 9.9 mg/dL (ref 8.4–10.5)
Chloride: 98 mEq/L (ref 96–112)
Creatinine, Ser: 3.17 mg/dL — ABNORMAL HIGH (ref 0.4–1.5)
Glucose, Bld: 123 mg/dL — ABNORMAL HIGH (ref 70–99)

## 2010-04-17 LAB — DIFFERENTIAL
Basophils Relative: 0 % (ref 0–1)
Lymphs Abs: 1.9 10*3/uL (ref 0.7–4.0)
Monocytes Absolute: 0.6 10*3/uL (ref 0.1–1.0)
Monocytes Relative: 5 % (ref 3–12)
Neutro Abs: 10.2 10*3/uL — ABNORMAL HIGH (ref 1.7–7.7)
Neutrophils Relative %: 80 % — ABNORMAL HIGH (ref 43–77)

## 2010-04-17 LAB — CBC
Hemoglobin: 16.3 g/dL (ref 13.0–17.0)
MCH: 30.5 pg (ref 26.0–34.0)
MCHC: 36 g/dL (ref 30.0–36.0)
MCV: 84.8 fL (ref 78.0–100.0)
RBC: 5.34 MIL/uL (ref 4.22–5.81)

## 2010-04-17 LAB — TROPONIN I: Troponin I: 0.04 ng/mL (ref 0.00–0.06)

## 2010-04-17 LAB — LIPID PANEL
HDL: 44 mg/dL (ref >39)
VLDL: 15 mg/dL (ref 0–40)

## 2010-04-17 LAB — CARDIAC PANEL(CRET KIN+CKTOT+MB+TROPI)
Total CK: 447 U/L — ABNORMAL HIGH (ref 7–232)
Total CK: 457 U/L — ABNORMAL HIGH (ref 7–232)
Troponin I: 0.02 ng/mL (ref 0.00–0.06)

## 2010-04-17 LAB — HEPATIC FUNCTION PANEL
Albumin: 4.6 g/dL (ref 3.5–5.2)
Alkaline Phosphatase: 72 U/L (ref 39–117)
Bilirubin, Direct: 0.2 mg/dL (ref 0.0–0.3)
Total Bilirubin: 1.5 mg/dL — ABNORMAL HIGH (ref 0.3–1.2)

## 2010-04-17 LAB — CREATININE, SERUM
Creatinine, Ser: 2.55 mg/dL — ABNORMAL HIGH (ref 0.4–1.5)
GFR calc non Af Amer: 27 mL/min — ABNORMAL LOW (ref >60)

## 2010-04-17 LAB — POCT CARDIAC MARKERS
CKMB, poc: 5.2 ng/mL (ref 1.0–8.0)
Myoglobin, poc: >500 ng/mL (ref 12–200)
Myoglobin, poc: >500 ng/mL (ref 12–200)
Troponin i, poc: <0.05 ng/mL (ref 0.00–0.09)

## 2010-04-17 LAB — LIPASE, BLOOD: Lipase: 47 U/L (ref 11–59)

## 2010-04-17 LAB — RAPID URINE DRUG SCREEN, HOSP PERFORMED
Amphetamines: NOT DETECTED
Tetrahydrocannabinol: NOT DETECTED

## 2010-04-17 LAB — TSH: TSH: 1.569 u[IU]/mL (ref 0.350–4.500)

## 2010-04-17 LAB — SODIUM: Sodium: 138 mEq/L (ref 135–145)

## 2010-04-18 LAB — CBC
HCT: 37.2 % — ABNORMAL LOW (ref 39.0–52.0)
Hemoglobin: 12.9 g/dL — ABNORMAL LOW (ref 13.0–17.0)
MCH: 29.9 pg (ref 26.0–34.0)
MCHC: 34.7 g/dL (ref 30.0–36.0)
MCV: 86.1 fL (ref 78.0–100.0)
RBC: 4.32 MIL/uL (ref 4.22–5.81)

## 2010-04-18 LAB — COMPREHENSIVE METABOLIC PANEL
AST: 24 U/L (ref 0–37)
CO2: 28 mEq/L (ref 19–32)
Calcium: 8.6 mg/dL (ref 8.4–10.5)
Chloride: 105 mEq/L (ref 96–112)
Creatinine, Ser: 1.32 mg/dL (ref 0.4–1.5)
GFR calc non Af Amer: 58 mL/min — ABNORMAL LOW (ref >60)
Glucose, Bld: 118 mg/dL — ABNORMAL HIGH (ref 70–99)
Total Bilirubin: 0.5 mg/dL (ref 0.3–1.2)

## 2010-04-18 LAB — DIFFERENTIAL
Lymphocytes Relative: 49 % — ABNORMAL HIGH (ref 12–46)
Lymphs Abs: 2.3 10*3/uL (ref 0.7–4.0)
Monocytes Absolute: 0.3 10*3/uL (ref 0.1–1.0)
Monocytes Relative: 7 % (ref 3–12)
Neutro Abs: 2 10*3/uL (ref 1.7–7.7)
Neutrophils Relative %: 41 % — ABNORMAL LOW (ref 43–77)

## 2010-04-18 LAB — MAGNESIUM: Magnesium: 2.2 mg/dL (ref 1.5–2.5)

## 2010-04-20 NOTE — Discharge Summary (Signed)
  NAME:  Dalton [name], Dalton [name] NO.:  [id number]  MEDICAL RECORD NO.:  [id number]           PATIENT TYPE:  I  LOCATION:  A211                          FACILITY:  [facility]  PHYSICIAN:  Tarry Kos, MD       DATE OF BIRTH:  01-30-1961  DATE OF ADMISSION:  04/17/2010 DATE OF DISCHARGE:  02/25/2012LH                              DISCHARGE SUMMARY   DISCHARGE DIAGNOSES: 1. Atypical chest pain. 2. Acute gastroenteritis. 3. Acute renal failure, resolved. 4. Cocaine abuse. 5. History of seizure disorder.  SUMMARY OF HOSPITAL COURSE:  Dalton [name] is a 50 year old male who presented to the emergency room with atypical chest pain, some diarrhea, was found to be very dehydrated and in renal failure.  His admission BUN and creatinine were 42 and 3.1.  He was admitted, had serial cardiac enzymes which were all negative.  He had EKG with no specific ST segment changes.  He was cocaine positive.  He does admit to trying cocaine that night.  He also had been having a lot of diarrhea which probably also contributed to his dehydration and renal failure.  He was given IV fluids and his renal failure resolved.  He was chest pain-free throughout his hospitalization.  His chest x-ray was negative.  He had a CT of his head which also was negative.  His BUN and creatinine today is 15 and 1.3.  He is being discharged home to follow up with his primary care physician 1 week at which point he will need further outpatient cardiac stress testing.  I have instructed Dalton [name] the dangers of cocaine abuse.  He understands this and says that he is going to do again.  Apparently, he had quit doing cocaine years ago and this was the first time he had done in years.  The patient being discharged home, again follow up with primary care physician for further outpatient cardiac stress testing.  PHYSICAL EXAMINATION:  VITAL SIGNS;  He has been afebrile.  Vital signs stable. GENERAL:  Alert and oriented  x4.  No apparent distress, cooperative and friendly. COR:  Regular rate and rhythm without murmur or gallops. CHEST:  Clear to auscultation bilaterally.  No wheeze, rhonchi, or rales. ABDOMEN:  Soft, nontender, and nondistended.  Positive bowel sounds.  No hepatosplenomegaly. EXTREMITIES:  No clubbing, cyanosis, or edema. PSYCHIATRIC:  Normal mood and affect. NEUROLOGIC:  No focal neurologic deficits. SKIN:  No rashes.  Please see discharge med rec sheet for full details.  He is to resume his home medications, and there have been no changes.                                           ______________________________ Tarry Kos, MD     RD/MEDQ  D:  04/18/2010  T:  04/19/2010  Job:  [id number]  Electronically Signed by Eldridge Dace MD on 04/20/2010 11:55:55 AM

## 2010-04-20 NOTE — H&P (Signed)
NAME:  Dalton [name], Dalton [name] NO.:  [id number]  MEDICAL RECORD NO.:  [id number]           PATIENT TYPE:  E  LOCATION:  APED                          FACILITY:  [facility]  PHYSICIAN:  Eduard Clos, MDDATE OF BIRTH:  1960-11-11  DATE OF ADMISSION:  04/17/2010 DATE OF DISCHARGE:  LH                             HISTORY & PHYSICAL   PRIMARY CARE PHYSICIAN:  The patient's primary care physician used to be Dr. Janna Arch, which has been changed now.  The patient goes to [facility], the name has to be confirmed.  CHIEF COMPLAINT:  Chest pain.  HISTORY OF PRESENT ILLNESS:  A 50 year old male with known history of CAD, status post CABG, status post stenting, history of systolic left ventricular dysfunction with last EF measured in 07-26-08 showed an EF of 55% through a cardiac cath, history of seizure disorder, and polysubstance abuse, has experienced some chest pain yesterday.  The patient states that for the last 2 days he has been having multiple episodes of diarrhea, no abdominal pain and he went to his friend's home wherein he had some alcohol, cigarette smoking, and cocaine.  He went back home.  On the way back home, he started developing discomfort underneath the chest, radiating to the left arm, and he sat on a bench and he passed out.  He did not have any tongue bite or incontinence of urine, after which, he called EMS and he was brought to the ER.  After placing one nitroglycerin patch, the patient's chest pain has resolved. At this time, the patient is admitted for further workup.  At this time, the patient had mild nausea and also brief episode of abdominal pain which has resolved now.  Denies any headache or visual symptoms or focal deficit.  Denies any dysuria or discharges.  In addition, the patient is found to be in acute renal failure.  The patient says that he has been having severe diarrhea for the last 2 days.  PAST MEDICAL HISTORY:  CAD, status  post CABG, status post stenting in 2001/07/26 in [place]; history of left systolic heart failure, last EF measured in July 26, 2008 was 55%; seizure disorder; polysubstance abuse; COPD; gastric ulcer; and chronic pain.  PAST SURGICAL HISTORY:  CABG, low-back surgery, left knee surgery, and carpal tunnel surgery.  MEDICATIONS PRIOR TO ADMISSION:  The patient needs to verify, it includes: 1. Lisinopril/HCTZ. 2. Plavix 75 mg p.o. daily. 3. Aspirin 81 mg p.o. daily. 4. Keppra 500 mg p.o. b.i.d.  ALLERGIES:  GABAPENTIN, IBUPROFEN, ZOLPIDEM, and HYDROMORPHONE.  FAMILY HISTORY:  Significant for premature coronary disease.  Father died due to complication of MI at age 50, his dad also had hypertension. Sister died of MI in Jul 26, 2005.  SOCIAL HISTORY:  The patient lives with his son.  He states he does not smoke or drink.  He abuses cocaine very often, but yesterday he had used all of them after a long time.  He does have previous history of using them.  REVIEW OF SYSTEMS:  As per the history of present illness, nothing else significant.  PHYSICAL EXAMINATION:  GENERAL:  The patient was examined  at bedside, not in acute distress. VITAL SIGNS:  Blood pressure is 122/80, pulse 88 per minute, temperature 98.9, respirations 18 per minute, and O2 sat 99%. HEENT:  Anicteric.  No pallor.  No discharge from ears, eyes, nose, or mouth. CHEST:  Bilateral air entry present.  No rhonchi.  No crepitation. HEART:  S1 and S2 heard. ABDOMEN:  Soft and nontender.  Bowel sounds heard. CNS:  The patient is alert, awake, and oriented to time, place, and person.  Moves upper and lower extremities 5/5. EXTREMITIES:  Peripheral pulses felt.  No edema.  LABORATORY DATA:  EKG shows normal sinus rhythm with sinus tachycardia, beats around 104 beats per minute with nonspecific ST changes.  Chest x- ray shows low lung volumes and minimal bibasilar atelectasis.  CBC:  WBC 12.7, hemoglobin 16.3, hematocrit is 45.3, platelets  224, and neutrophils 80%.  Basic metabolic panel:  Sodium 138, potassium 3.4, chloride 98, carbon dioxide 26, glucose 123, BUN 42, creatinine 3.1, calcium 9.9.  CK-MB 5.2, troponin-I less than 0.05, myoglobin more than 500.  Drug screen positive for cocaine.  ASSESSMENT: 1. Chest pain with history of coronary artery disease status post     coronary artery bypass graft, history of stenting at this time to     rule out acute coronary syndrome. 2. Polysubstance abuse with cocaine use this night. 3. Acute renal failure. 4. Diarrhea. 5. History of left ventricular dysfunction, last EF measured was 55%     in 2010. 6. Seizure disorder. 7. Chronic back pain. 8. Hyperlipidemia.  PLAN: 1. At this time, we will admit the patient to telemetry as the patient     is chest pain-free at this time. 2. For his chest pain, we will be cycling cardiac markers.  The     patient will be on aspirin and Plavix.  If CT of head is negative     for any bleed, we will be getting 2-D echo as well as a cardiology     consult.  The patient will be continued on nitroglycerin paste. 3. Syncope.  At this time, we are going to get a CT of head. 4. Acute renal failure probably from severe diarrhea he has been     having last 2 days and added on that, the patient is also taking     ACE inhibitors and HCTZ.  At this time, we are going to hold     lisinopril and HCTZ, hydrate the patient, and careful not to     overload.  We will get urine sodium and creatinine.  If the serum     creatinine does not improve, we need to get his renal sonogram.  At     this time, I think it is from dehydration or probably severe     diarrhea. 5. Severe diarrhea probably from the viral etiology.  At this time, we     are going to check stool studies for C. diff and culture and     sensitivity, hydrate the patient, and also going to check LFTs and     lipase. 6. The patient will need substance abuse counseling. 7. For his seizure, he  has been seizure-free for last 7 months.  The     last time he had seizure was when he did not     take his medication.  We will continue his Keppra. 8. Most importantly, we need to verify his home medications. 9. Further recommendation as clinical course evolves.  Eduard Clos, MD     ANK/MEDQ  D:  04/17/2010  T:  04/17/2010  Job:  [id number]  Electronically Signed by Midge Minium MD on 04/20/2010 05:06:37 PM

## 2010-05-28 LAB — BASIC METABOLIC PANEL
BUN: 3 mg/dL — ABNORMAL LOW (ref 6–23)
CO2: 26 mEq/L (ref 19–32)
CO2: 27 mEq/L (ref 19–32)
Calcium: 9.4 mg/dL (ref 8.4–10.5)
Calcium: 9.5 mg/dL (ref 8.4–10.5)
Creatinine, Ser: 0.97 mg/dL (ref 0.4–1.5)
GFR calc Af Amer: >60 mL/min (ref >60)
GFR calc non Af Amer: 51 mL/min — ABNORMAL LOW (ref >60)
Glucose, Bld: 121 mg/dL — ABNORMAL HIGH (ref 70–99)
Glucose, Bld: 131 mg/dL — ABNORMAL HIGH (ref 70–99)
Potassium: 3.7 mEq/L (ref 3.5–5.1)
Sodium: 139 mEq/L (ref 135–145)

## 2010-05-28 LAB — DIFFERENTIAL
Eosinophils Relative: 1 % (ref 0–5)
Lymphocytes Relative: 19 % (ref 12–46)
Monocytes Absolute: 0.6 10*3/uL (ref 0.1–1.0)
Monocytes Relative: 7 % (ref 3–12)
Neutro Abs: 6.4 10*3/uL (ref 1.7–7.7)

## 2010-05-28 LAB — RAPID URINE DRUG SCREEN, HOSP PERFORMED
Amphetamines: NOT DETECTED
Barbiturates: NOT DETECTED
Opiates: POSITIVE — AB
Tetrahydrocannabinol: NOT DETECTED

## 2010-05-28 LAB — CBC
HCT: 34.9 % — ABNORMAL LOW (ref 39.0–52.0)
Hemoglobin: 12.1 g/dL — ABNORMAL LOW (ref 13.0–17.0)
RBC: 3.87 MIL/uL — ABNORMAL LOW (ref 4.22–5.81)
RDW: 13.6 % (ref 11.5–15.5)

## 2010-05-28 LAB — POCT CARDIAC MARKERS: CKMB, poc: 1.4 ng/mL (ref 1.0–8.0)

## 2010-05-29 LAB — DIFFERENTIAL
Basophils Absolute: 0 10*3/uL (ref 0.0–0.1)
Basophils Absolute: 0 10*3/uL (ref 0.0–0.1)
Basophils Absolute: 0 10*3/uL (ref 0.0–0.1)
Basophils Relative: 0 % (ref 0–1)
Eosinophils Absolute: 0.2 10*3/uL (ref 0.0–0.7)
Eosinophils Relative: 1 % (ref 0–5)
Eosinophils Relative: 0 % (ref 0–5)
Eosinophils Relative: 3 % (ref 0–5)
Lymphocytes Relative: 25 % (ref 12–46)
Lymphocytes Relative: 33 % (ref 12–46)
Lymphocytes Relative: 39 % (ref 12–46)
Lymphs Abs: 1.9 10*3/uL (ref 0.7–4.0)
Monocytes Absolute: 0.2 10*3/uL (ref 0.1–1.0)
Monocytes Absolute: 0.2 10*3/uL (ref 0.1–1.0)
Monocytes Relative: 4 % (ref 3–12)
Monocytes Relative: 3 % (ref 3–12)
Monocytes Relative: 6 % (ref 3–12)
Neutro Abs: 1.9 10*3/uL (ref 1.7–7.7)
Neutro Abs: 6.9 10*3/uL (ref 1.7–7.7)
Neutrophils Relative %: 49 % (ref 43–77)

## 2010-05-29 LAB — COMPREHENSIVE METABOLIC PANEL
ALT: 17 U/L (ref 0–53)
AST: 24 U/L (ref 0–37)
AST: 24 U/L (ref 0–37)
Albumin: 4.6 g/dL (ref 3.5–5.2)
Albumin: 4.3 g/dL (ref 3.5–5.2)
Alkaline Phosphatase: 50 U/L (ref 39–117)
BUN: 10 mg/dL (ref 6–23)
CO2: 28 mEq/L (ref 19–32)
Calcium: 10 mg/dL (ref 8.4–10.5)
Chloride: 107 mEq/L (ref 96–112)
Creatinine, Ser: 1.46 mg/dL (ref 0.4–1.5)
GFR calc Af Amer: >60 mL/min (ref >60)
GFR calc Af Amer: 56 mL/min — ABNORMAL LOW (ref >60)
GFR calc non Af Amer: 52 mL/min — ABNORMAL LOW (ref >60)
Potassium: 2.8 mEq/L — ABNORMAL LOW (ref 3.5–5.1)
Sodium: 138 mEq/L (ref 135–145)
Total Bilirubin: 1.5 mg/dL — ABNORMAL HIGH (ref 0.3–1.2)
Total Protein: 8 g/dL (ref 6.0–8.3)

## 2010-05-29 LAB — POCT CARDIAC MARKERS
CKMB, poc: 1.4 ng/mL (ref 1.0–8.0)
CKMB, poc: 1.8 ng/mL (ref 1.0–8.0)
Myoglobin, poc: 375 ng/mL (ref 12–200)
Troponin i, poc: <0.05 ng/mL (ref 0.00–0.09)
Troponin i, poc: <0.05 ng/mL (ref 0.00–0.09)
Troponin i, poc: <0.05 ng/mL (ref 0.00–0.09)

## 2010-05-29 LAB — BASIC METABOLIC PANEL
Calcium: 8 mg/dL — ABNORMAL LOW (ref 8.4–10.5)
Calcium: 8.7 mg/dL (ref 8.4–10.5)
GFR calc Af Amer: >60 mL/min (ref >60)
GFR calc Af Amer: >60 mL/min (ref >60)
GFR calc non Af Amer: >60 mL/min (ref >60)
GFR calc non Af Amer: >60 mL/min (ref >60)
Glucose, Bld: 108 mg/dL — ABNORMAL HIGH (ref 70–99)
Sodium: 134 mEq/L — ABNORMAL LOW (ref 135–145)
Sodium: 140 mEq/L (ref 135–145)

## 2010-05-29 LAB — BRAIN NATRIURETIC PEPTIDE: Pro B Natriuretic peptide (BNP): <30 pg/mL (ref 0.0–100.0)

## 2010-05-29 LAB — CBC
HCT: 35.5 % — ABNORMAL LOW (ref 39.0–52.0)
Hemoglobin: 11.2 g/dL — ABNORMAL LOW (ref 13.0–17.0)
Hemoglobin: 11.6 g/dL — ABNORMAL LOW (ref 13.0–17.0)
MCHC: 34.7 g/dL (ref 30.0–36.0)
MCV: 91.6 fL (ref 78.0–100.0)
Platelets: 231 10*3/uL (ref 150–400)
Platelets: 217 10*3/uL (ref 150–400)
RBC: 3.45 MIL/uL — ABNORMAL LOW (ref 4.22–5.81)
RDW: 15.1 % (ref 11.5–15.5)
RDW: 15.2 % (ref 11.5–15.5)
WBC: 5.7 10*3/uL (ref 4.0–10.5)
WBC: 8.3 10*3/uL (ref 4.0–10.5)

## 2010-05-29 LAB — CK TOTAL AND CKMB (NOT AT ARMC)
CK, MB: 4.1 ng/mL — ABNORMAL HIGH (ref 0.3–4.0)
CK, MB: 4.6 ng/mL — ABNORMAL HIGH (ref 0.3–4.0)
Relative Index: 0.6 (ref 0.0–2.5)
Relative Index: 0.7 (ref 0.0–2.5)
Relative Index: 0.8 (ref 0.0–2.5)
Total CK: 334 U/L — ABNORMAL HIGH (ref 7–232)
Total CK: 567 U/L — ABNORMAL HIGH (ref 7–232)
Total CK: 584 U/L — ABNORMAL HIGH (ref 7–232)

## 2010-05-29 LAB — RAPID URINE DRUG SCREEN, HOSP PERFORMED
Barbiturates: NOT DETECTED
Benzodiazepines: NOT DETECTED
Benzodiazepines: NOT DETECTED
Cocaine: POSITIVE — AB
Opiates: POSITIVE — AB
Tetrahydrocannabinol: NOT DETECTED

## 2010-05-29 LAB — ACETAMINOPHEN LEVEL: Acetaminophen (Tylenol), Serum: <10 ug/mL — ABNORMAL LOW (ref 10–30)

## 2010-05-29 LAB — GLUCOSE, CAPILLARY
Glucose-Capillary: 103 mg/dL — ABNORMAL HIGH (ref 70–99)
Glucose-Capillary: 106 mg/dL — ABNORMAL HIGH (ref 70–99)

## 2010-05-29 LAB — LIPID PANEL
LDL Cholesterol: 116 mg/dL — ABNORMAL HIGH (ref 0–99)
VLDL: 23 mg/dL (ref 0–40)

## 2010-05-29 LAB — TSH: TSH: 2.546 u[IU]/mL (ref 0.350–4.500)

## 2010-05-29 LAB — TROPONIN I
Troponin I: 0.03 ng/mL (ref 0.00–0.06)
Troponin I: 0.03 ng/mL (ref 0.00–0.06)

## 2010-05-29 LAB — HEPARIN LEVEL (UNFRACTIONATED): Heparin Unfractionated: 0.14 IU/mL — ABNORMAL LOW (ref 0.30–0.70)

## 2010-05-29 LAB — MAGNESIUM: Magnesium: 2.4 mg/dL (ref 1.5–2.5)

## 2010-05-29 LAB — D-DIMER, QUANTITATIVE: D-Dimer, Quant: 0.34 ug/mL-FEU (ref 0.00–0.48)

## 2010-05-29 LAB — HEMOGLOBIN A1C: Hgb A1c MFr Bld: 5.9 % (ref 4.6–6.1)

## 2010-06-05 ENCOUNTER — Emergency Department (HOSPITAL_COMMUNITY)
Admission: EM | Admit: 2010-06-05 | Discharge: 2010-06-05 | Disposition: A | Discharge status: Home / Self Care | Payer: Medicare Other | Attending: Emergency Medicine | Admitting: Emergency Medicine

## 2010-06-05 DIAGNOSIS — I10 Essential (primary) hypertension: Secondary | ICD-10-CM | POA: Insufficient documentation

## 2010-06-05 DIAGNOSIS — M545 Low back pain, unspecified: Secondary | ICD-10-CM | POA: Insufficient documentation

## 2010-06-05 DIAGNOSIS — M5430 Sciatica, unspecified side: Secondary | ICD-10-CM | POA: Insufficient documentation

## 2010-06-05 DIAGNOSIS — M79609 Pain in unspecified limb: Secondary | ICD-10-CM | POA: Insufficient documentation

## 2010-06-05 DIAGNOSIS — I252 Old myocardial infarction: Secondary | ICD-10-CM | POA: Insufficient documentation

## 2010-06-05 DIAGNOSIS — Z951 Presence of aortocoronary bypass graft: Secondary | ICD-10-CM | POA: Insufficient documentation

## 2010-06-05 DIAGNOSIS — R209 Unspecified disturbances of skin sensation: Secondary | ICD-10-CM | POA: Insufficient documentation

## 2010-06-05 DIAGNOSIS — Z79899 Other long term (current) drug therapy: Secondary | ICD-10-CM | POA: Insufficient documentation

## 2010-06-05 DIAGNOSIS — G40909 Epilepsy, unspecified, not intractable, without status epilepticus: Secondary | ICD-10-CM | POA: Insufficient documentation

## 2010-07-07 NOTE — H&P (Signed)
NAME:  Dalton [name], Dalton [name] NO.:  [id number]   MEDICAL RECORD NO.:  [id number]          PATIENT TYPE:  OBV   LOCATION:  A308                          FACILITY:  [facility]   PHYSICIAN:  Osvaldo Shipper, MD     DATE OF BIRTH:  02-Dec-1960   DATE OF ADMISSION:  09/26/2007  DATE OF DISCHARGE:  LH                              HISTORY & PHYSICAL   PRIMARY MEDICAL DOCTOR:  Unassigned.  He has been seen by [facility] in the past.   ADMISSION DIAGNOSES:  1. Chest pain with nonspecific EKG changes.  2. History of coronary artery disease status post CABG.  3. Cocaine abuse.  4. Suicidal ideation.  5. History of psychosis.   CHIEF COMPLAINT:  Chest pain.   HISTORY OF PRESENT ILLNESS:  The patient is a 50 year old African  American male who has a history of coronary artery disease status post  CABG in the year 2002 who actually [facility] today after he was admitted for suicidal ideation.  The patient  went to his son's home, started feeling depressed again.  Again, had  suicidal ideation and so he subsequently went on to smoke some crack  cocaine.  He smoked from 2:00 p.m. through the evening and night and  then started developing chest pain.  The chest pain was located on the  left side described as a sharp pain, 10/10 in intensity, radiating to  the left arm.  He also admits to having some shortness of breath and  palpitations.  Denies any dizziness or lightheadedness.  He admits to  having some lower extremity edema for a long time.  Denies any nausea or  vomiting, abdominal pain.  Denies any cough, fever or chills.  Precipitating factor is cocaine abuse.  No other aggravating or  alleviating factors identified.   HOME MEDICATIONS:  Unfortunately, the patient has not brought in his  medication list, but he tells me he is on:  1. Aspirin 81 mg daily.  2. Plavix 75 mg daily.  3. Cymbalta, probably 60 mg daily.  4. Haldol unknown  dose.  5. Trazodone unknown dose.  6. Vicodin unknown dose.  7. Xanax unknown dose.  8. Dilantin t.i.d., either 100 or 200 mg tablet, unknown dose again.   I have asked him to call his son to bring in all of his medication  bottles so that we can resume his medications in the hospital.   ALLERGIES:  IBUPROFEN, NEURONTIN, AMBIEN, NAPROXEN.   PAST MEDICAL HISTORY:  1. Coronary artery disease status post CABG in the year 2003.      Subsequently had cardiac catheterization in July 2003 as well which      showed 50% stenosis in the left anterior descending.  Subsequent to      that, I do not see any further cardiac catheterization reports in      the computer.  He did have a stress test in the year 2007 which did      not show any evidence of ischemia.  EF was 47%.  2. Seizure disorder.  3.  Polysubstance drug abuse.  4. Depression.  5. Psychosis.   PAST SURGICAL HISTORY:  1. Knee surgeries.  2. Back surgeries.   SOCIAL HISTORY:  Usually lives in the [address] with [employer]  in [place], but he is homeless.  Today he was visiting his son in  [place] after his discharge from [facility].  Denies smoking  cigarettes.  Denies alcohol use.  He only smokes cocaine.   FAMILY HISTORY:  Significant for coronary artery disease and diabetes.   REVIEW OF SYSTEMS:  GENERAL:  Positive for weakness, malaise.  HEENT:  Unremarkable.  CARDIOVASCULAR:  As in HPI.  RESPIRATORY:  As in HPI.  GI:  Unremarkable.  GU:  Unremarkable.  Other systems unremarkable.   PHYSICAL EXAMINATION:  VITAL SIGNS:  Temperature 98.3, blood pressure  135/80, heart rate in the 90s, respiratory rate 20, saturation 97% on  room air.  GENERAL:  This is an overweight male in no distress.  Has a flat affect.  HEENT:  There is no pallor.  No icterus.  Oral mucosa membranes are  moist.  No oral lesions are noted.  NECK:  Soft and supple.  No thyromegaly is appreciated.  LUNGS:  Clear to auscultation  bilaterally.  No wheezes, rales or  rhonchi.  CARDIOVASCULAR:  S1, S2 normal, regular.  No murmurs appreciated.  No  rubs, no bruits, no JVD is noted.  ABDOMEN:  Soft, nontender, nondistended.  Bowel sounds are present.  No  mass or organomegaly is appreciated.  EXTREMITIES:  Mild edema bilaterally.  No peripheral pulses are  palpable.  NEUROLOGICAL:  He is alert and oriented x3.  No focal neurological  deficits are present.   LABORATORY DATA:  CBC is unremarkable.  B-met is unremarkable.  Cardiac  markers are negative.  Urine drug screen positive for benzodiazepines  and cocaine.  UA was unremarkable.  Alcohol level less than 5.  Chest x-  ray showed low volume chest.  No other acute findings.  EKG showed sinus  rhythm with a normal axis.  Intervals appear to be in the normal range.  He had P wave inversion in lead 3 and nonspecific P wave changes in  lateral leads including V4, V5 and V6 compared to a previous EKG from  July 2009.  These changes are new.   ASSESSMENT:  This is a 50 year old African American male who presents  with chest pain.  He has numerous medical issues as discussed above  including a history of CAD status post CABG in 2003.  He does have some  EKG changes, likely secondary to ischemia or secondary to vasospasm from  cocaine abuse.  The patient, however, merits observation in the  hospital.   PLAN:  1. Chest pain secondary to cocaine use with EKG abnormalities.  Will      repeat EKG on the floor.  We will rule him out for acute coronary      syndrome.  Will also consult [facility] to take a look at      him.  Continue with aspirin and Plavix.  No beta blockers at this      time.  Fasting lipid profile will be checked.  2. History of psych disorder.  He does have active suicidal ideation.      Sitter will be utilized while he is inpatient.  ACT team will be      consulted when he is medically stable and cleared by Cardiology.      We will await  his  medication bottles from home before restarting      his home medications.  3. History of seizure disorder.  I will start him on 100 mg t.i.d. of      Dilantin until we obtain medication bottles from home, then we can      change the dosage as needed.  4. I will put him on full dose Lovenox for now.  This can be changed      over to DVT prophylactic dose if he rules out.  5. This will be an observation.  Hopefully, he should be able to      transfer to [facility] when his acute medical needs have      been dealt with.      Osvaldo Shipper, MD  Electronically Signed     GK/MEDQ  D:  09/26/2007  T:  09/26/2007  Job:  [id number]   cc:   [facility]

## 2010-07-07 NOTE — Discharge Summary (Signed)
NAME:  Dalton [name], Dalton [name] NO.:  [id number]   MEDICAL RECORD NO.:  [id number]          PATIENT TYPE:  IPS   LOCATION:  0507                          FACILITY:  [facility]   PHYSICIAN:  Geoffery Lyons, M.D.      DATE OF BIRTH:  1961-01-07   DATE OF ADMISSION:  12/28/2007  DATE OF DISCHARGE:  01/03/2008                               DISCHARGE SUMMARY   CHIEF COMPLAINT AND PRESENT ILLNESS:  This was 1 of multiple extensive  admissions to [facility] for this 50 year old male with  cocaine dependence and a mood disorder.  He was discharged from our unit  a couple of weeks prior to this admission and returned again after  having relapsed on cocaine following his receipt of his 1st of the month  check.  He has been involved in this pattern of behavior for the last  several months, had been consistently on our unit every month since July  with a similar pattern.  Initially presents to cardiology where he had  chest pain, voiced suicidal thoughts and referred back to inpatient  psychiatry where he would continue to allege suicidal thoughts with no  place to go, no outpatient followup, as he is being admitted over and  over again.   PAST PSYCHIATRIC HISTORY:  As already stated, multiple admissions.  This  is the 5th admission since July.  Multiple medications.  We had  recommended a prolonged rehab program but he had refused and now it is  being suggested again.   MEDICAL HISTORY:  1. Coronary artery disease, triple bypass 2002.  2. Seizure disorder.   MEDICATIONS:  1. Aspirin 81 mg per day.  2. Colace 100 mg per day.  3. MiraLax 17 grams per day.  4. Keppra 500 twice a day.  5. Vytorin 10-20 mg daily.  6. Geodon 60 mg twice a day.  7. Remeron 45 mg per day.   PHYSICAL EXAMINATION:  Failed to show any acute findings.   LABORATORY WORKUP:  Not available in the chart.   MENTAL STATUS EXAM:  Reveals alert cooperative male.  Pretty dysphoric,  depressed.  Affect  broad.  Endorses he feels awful. Thought process  logical, coherent and relevant.  Some positive suicidal thoughts.  No  homicidal ideas.  No delusions.  No hallucinations.  Cognition well-  preserved.   ADMISSION DIAGNOSES:  Axis I:  Major depressive disorder.  Cocaine  dependence.  Axis II:  No diagnosis.  Axis III:  Chest pain secondary to cocaine abuse.  Seizure disorder.  Coronary artery disease.  Axis IV:  Moderate.  Axis V:  GAF on admission 35-40.  Highest GAF in the last year 60.   COURSE IN THE HOSPITAL:  He was admitted, placed back on his  medications.  We pursued further work on getting him to [facility] this  time around.  He was agreeable.  He was accepted, so we discharged to  [facility] program, where he should stay for 90 days to  work on his chronic relapsing pattern.   DISCHARGE DIAGNOSES:  Axis I:  Major depressive disorder.  Cocaine  dependence, mood disorder, not otherwise specified.  Axis II:  No diagnosis.  Axis III:  Chest pain secondary to cocaine.  Seizure disorder.  Coronary  artery disease.  Axis IV:  Moderate.  Axis V:  GAF upon discharge 50.   DISCHARGE MEDICATIONS:  1. Aspirin 81 mg per day.  2. Colace 100 mg per day.  3. Keppra 500 mg twice a day.  4. Geodon 60 mg twice a day.  5. Remeron 45 mg at bedtime.  6. Zetia 10 mg per day.  7. Zocor 20 mg per day.  8. Haldol 10 mg 3 times a day.  9. Cogentin 1 mg twice a day.   FOLLOW UP:  [facility] in [place], [place].      Geoffery Lyons, M.D.  Electronically Signed     IL/MEDQ  D:  01/30/2008  T:  01/31/2008  Job:  [id number]

## 2010-07-07 NOTE — Consult Note (Signed)
NAME:  Dalton [name], Dalton [name] NO.:  [id number]   MEDICAL RECORD NO.:  [id number]          PATIENT TYPE:  OBV   LOCATION:  A308                          FACILITY:  [facility]   PHYSICIAN:  Maisie Fus C. Wall, MD, FACCDATE OF BIRTH:  September 17, 1960   DATE OF CONSULTATION:  09/26/2007  DATE OF DISCHARGE:                                 CONSULTATION   We were asked by the [facility], specifically Dr. Rito Ehrlich, to  consult and evaluate Willeen Cass with chest pain and known coronary  artery disease.   HISTORY OF PRESENT ILLNESS:  Mr. Riggi is a 50 year old African  American male.  He has had coronary artery bypass grafting in 2002.  He  was discharged from [facility] yesterday after being admitted for  suicidal ideation.  He subsequently smoked crack cocaine yesterday  afternoon.  He came to the emergency room with chest discomfort.   He describes this as a pressure and tightness.  He has some shortness of  breath and palpitations.  He also has chronic back pain.   Denied any nausea, vomiting, or abdominal pain.   His EKG shows some nonspecific ST-segment changes in the lateral leads.  He was admitted for observation and rule out MI.   He is currently suppose to be on aspirin 81 mg a day, Plavix 75 mg a  day, Cymbalta 60 mg a day, Haldol unknown dose, trazodone unknown dose,  Vicodin unknown dose, Xanax unknown dose, and Dilantin 100 or 200 mg  t.i.d. is not clear.   He is intolerant of IBUPROFEN, NEURONTIN, AMBIEN, and NAPROXEN.   PAST SURGICAL HISTORY:  Knee surgeries and back surgery.   Other diagnoses include depression, psychosis, seizure disorder, and  polysubstance abuse.   His last objective assessment is coronary artery disease, as best I can  tell is in 2007, with a nuclear imaging study showing no ischemia; EF  47%.   His review of systems other than the HPI is negative.  Please refer to  the H&P by Dr. Rito Ehrlich as well.   PHYSICAL EXAMINATION:   VITAL SIGNS:  His blood pressure was 135/80, his  pulse is 90 in sinus rhythm, his respiratory rate is 20, and sats 97% on  room air.  His temperature is 98.3.  GENERAL APPEARANCE:  He is an overweight male in no acute distress.  He  has a very flat affect.  He is alert and oriented x3.  He is not sure  how he got here, however.  HEENT:  Normocephalic and atraumatic.  PERRLA.  Extraocular movements  are intact.  Sclerae clear.  NECK:  Thick bull neck, difficult to assess JVD.  Carotids upstrokes are  equal bilaterally without bruits.  Thyroid is not grossly enlarged.  LUNGS:  Clear to auscultation anteriorly.  PMI is poorly appreciated.  He has normal S1and S2.  No murmurs.  ABDOMEN:  Soft, good bowel sounds.  He is obese.  Organomegaly could not  be adequately assessed.  EXTREMITIES:  No cyanosis, clubbing, or edema.  Pulses are present.  NEUROLOGIC:  Grossly intact.  SKIN:  Unremarkable.   His  chest x-ray shows low volumes; no airspace disease; status post  median sternotomy; no abnormal cardiomediastinal contours.   His first point-of-care markers are negative.  His urine drug screen is  positive for cocaine.   ASSESSMENT:  Chest pressure in the setting of cocaine use with known  coronary artery disease.  He has nonspecific ST-segment changes, which  could be from vasospasm.   RECOMMENDATIONS:  1. Continue home meds.  2. Check cardiac markers.  If they are negative x3, he can be      discharged without further cardiac evaluation.  Call with any      questions.  3. No beta-blockers in the setting of cocaine use or in the future      because of high probability of recurrent use.   Thank you very much for the consultation.      Thomas C. Daleen Squibb, MD, [facility]  Electronically Signed     TCW/MEDQ  D:  09/26/2007  T:  09/26/2007  Job:  [id number]

## 2010-07-07 NOTE — Discharge Summary (Signed)
NAME:  Dalton [name], HASLER NO.:  [id number]   MEDICAL RECORD NO.:  [id number]          PATIENT TYPE:  IPS   LOCATION:  0303                          [facility]ACILITY:  [facility]   PHYSICIAN:  Anselm Jungling, MD  DATE OF BIRTH:  [birth year]   DATE OF ADMISSION:  09/06/2007  DATE OF DISCHARGE:  09/25/2007                               DISCHARGE SUMMARY   IDENTIFYING DATA AND REASON FOR ADMISSION:  The patient is a 50 year old  single African-American male, homeless, with a history of psychosis and  multiple medical issues.  He was brought to the hospital by his son.  He  had been having increasing suicidal ideation and psychotic symptoms.  He  had been off his psychotropic medications for several weeks.  He had  been abusing cocaine as recently as the day prior to admission.  Please  refer to the admission note for further details pertaining to the  symptoms, circumstances and history that led to his hospitalization.  He  was given an initial Axis I diagnosis of psychosis NOS and cocaine  abuse.   MEDICAL AND LABORATORY:  The patient had a history of myocardial  infarction and seizure disorder.  He was medically and physically  assessed by the psychiatric nurse practitioner.  He was continued on a  regimen that included Plavix, Robaxin, Ultram, Remeron, metoprolol, and  aspirin.  At the time of discharge, he was referred to our family  practice for ongoing medical care.  In addition, he was referred to  [facility] orthopedics for follow-up care.   HOSPITAL COURSE:  The patient was admitted to the adult inpatient  psychiatric service.  He presented as a well-nourished, normally-  developed male who was tired, dejected, but awake and fully oriented.  There were no signs or symptoms of psychosis, thought disorder or  delusionality in the initial interview.   He was started on a regimen of Haldol to address underlying psychosis.  This was well tolerated and adjusted throughout his  stay.   The patient was also treated with Dilantin for seizure disorder, and had  no seizures during his inpatient stay.  He participated in various  therapeutic groups and activities; he was a reasonably good participant  in treatment program.  He worked closely with the case manager towards  finding a long-term rehabilitation program.  They worked towards his  being placed in the progressive program in the state of [place].   Ultimately, the patient was discharged to his family.  At the time of  discharge, he was absent of suicidal ideation, and his mental status  appeared to be stable with respect to psychotic symptoms.  He was to  follow up for medical care with [facility] clinics, and later with our  family practice.   AFTERCARE:  The patient was to follow up with the [facility] program;  should begin there on 09/27/2007.  He was also to follow up at the  [facility].   DISCHARGE MEDICATIONS:  1. Aspirin 81 mg daily.  2. Cogentin 1 mg at bedtime.  3. Dilantin 200 mg t.i.d.  4. Plavix 75 mg daily.  5. Haldol 15 mg q.h.s.  6. Robaxin 750 mg q.h.s.  7. Metoprolol 12.5 mg b.i.d.  8. Remeron 45 mg q.h.s.  9. Protonix 40 mg daily.  10.Symmetrel 100 mg b.i.d.  11.Ultram 1-2 tablets up to three times daily for pain.   DISCHARGE DIAGNOSES:  AXIS I:  Depressive disorder NOS, history of  polysubstance abuse.  AXIS II:  Deferred.  AXIS III:  History of myocardial infarction, atherosclerotic vascular  disease, chronic pain, hypertension.  AXIS IV:  Stressors severe.  AXIS V:  GAF on discharge 55.      Anselm Jungling, MD  Electronically Signed     SPB/MEDQ  D:  10/10/2007  T:  10/10/2007  Job:  [phone]

## 2010-07-07 NOTE — Discharge Summary (Signed)
NAME:  Dalton [name], Dalton [name] NO.:  [id number]   MEDICAL RECORD NO.:  [id number]          PATIENT TYPE:  INP   LOCATION:  A308                          FACILITY:  [facility]   PHYSICIAN:  Osvaldo Shipper, MD     DATE OF BIRTH:  12/03/1960   DATE OF ADMISSION:  09/26/2007  DATE OF DISCHARGE:  08/05/2009LH                               DISCHARGE SUMMARY   The patient does not have a PMD.   DISCHARGE DIAGNOSES:  1. Cocaine induced chest pain, improved.  2. History of coronary artery disease, status post coronary artery      bypass grafting.  3. Suicidal ideation.  4. History of psychosis.   BRIEF HOSPITAL COURSE:  Briefly, this is a 50 year old African American  male who was actually at [facility] in [place] until the day  before yesterday when he was allowed to go home.  He was being managed  for his suicidal ideation and depression.  The patient went home,  started getting suicidal thoughts again and then he decided to use  cocaine.  This was followed by onset of left-sided chest pain.  The  patient had nonspecific EKG changes which prompted admission to the  hospital.  The patient was seen by Dr. Daleen Squibb from [facility], and  he did not think that the patient needed additional testing.  The  patient has ruled out for acute coronary syndrome with serial cardiac  enzymes.  The rest of his blood work was unremarkable.  His urine did  test positive for cocaine and benzodiazepine.   So, I explained to the patient that he needs to stop using cocaine,  although there is a very high likelihood that he will continue to do so.  All the possible complications of cocaine use have been explained to the  patient.   His other medical issues include coronary artery disease, status post  CABG, which is stable as discussed above.  He also has history of  seizure disorder for which he is on Dilantin.  He has a history of psych  disorders, including psychosis and depression  for which he is on  psychotropic medications.  I had told him yesterday to have his son  bring his medication list as the patient was not sure what he was taking  at home.  Unfortunately, this is still not available.   On the day of discharge, his chest pain has almost resolved.  No other  symptoms at present.  Vital signs were all stable.  Examination  unremarkable.   We are awaiting ACT to evaluate him for possible transfer to a  psychiatric facility.  He is medically stable at this time.   DISCHARGE MEDICATIONS:  He may continue all of his home medications.  No  new medications have been added.  Since his son did not bring in the  list, I am not sure what exactly he takes at home, but apparently is on  the following   1. Aspirin 81 mg daily.  2. Plavix 75 mg daily.  3. Cymbalta, unknown dose.  4. Haldol, unknown.  5. Trazodone, unknown  dose  6. Vicodin, unknown dose.  7. Xanax, unknown dose.  8. Dilantin t.i.d. 100 or 200 mg.   DIET:  Heart-healthy.   PHYSICAL ACTIVITY:  No restrictions   He had a chest x-ray which did not show any acute cardiopulmonary  process.   So, he is pending ACT evaluation after which his disposition will be  decided.  Total time of this discharge encounter was 40 minutes.      Osvaldo Shipper, MD  Electronically Signed     GK/MEDQ  D:  09/27/2007  T:  09/27/2007  Job:  [id number]

## 2010-07-07 NOTE — Discharge Summary (Signed)
NAME:  TARRIN, LEBOW NO.:  [id number]   MEDICAL RECORD NO.:  [id number]          PATIENT TYPE:  INP   LOCATION:  4732                         FACILITY:  [facility]   PHYSICIAN:  Pearlean Brownie, M.D.DATE OF BIRTH:  Jan 11, 1961   DATE OF ADMISSION:  12/26/2007  DATE OF DISCHARGE:  12/28/2007                               DISCHARGE SUMMARY   He does not have a primary care Kennis Wissmann, but he used to go to [facility]  Cardiology and was seen there several years ago.   DISCHARGE DIAGNOSES:  1. Chest pain with cocaine involvement.  2. Coronary artery disease.  3. Major depression with suicidal ideation.  4. Seizure disorder.   DISCHARGE MEDICATIONS:  1. Aspirin 81 mg p.o. daily.  2. Colace 100 mg p.o. daily.  3. MiraLax 17 g of powder and 8 ounces of water daily.  4. Keppra 500 mg p.o. b.i.d.  5. Vytorin 10/20 p.o. daily.  6. Geodon 60 mg p.o. b.i.d.  7. Remeron 45 mg p.o. nightly.   CONSULTS:  Psychiatry was consulted and Dr. Jeanie Sewer came to do the  consultation on December 28, 2007.  The patient on admission complained  of chest pain and did have an EKG that showed sinus tachycardia with a  rate of 116.  No ST depression or elevation.  The patient also had a  chest x-ray that showed low lung volumes with a possible component of  interstitial edema.   SIGNIFICANT LABS DURING THIS HOSPITALIZATION:  Urine drug screen that  was positive for cocaine.  Initial point of care cardiac enzymes that  was negative.  A CBC that showed mild anemia with hemoglobin of 12.3 and  a basic metabolic panel that showed a creatinine that was elevated at  1.62 with a glucose of 142.  He also had a Dilantin level of less than  2.5 indicating that he was likely not taking this medication as an  outpatient.  He had 3 more sets of cardiac enzymes on November 24, 2007,  November 25, 2007, as well as later in the evening on November 25, 2007, and  another drawn on November 26, 2007, that were all  troponin I normal.  However, he did have an elevated total creatinine kinase that initially  was elevated to 3285 and on discharge had come down to 2663.  The  patient on December 26, 2007, had a B natriuretic peptide of 58 and on  the day prior to discharge on December 27, 2007, had a potassium of 3.2  which was repleted with 40 mEq of potassium.   BRIEF HOSPITAL COURSE:  This is a 50 year old male with coronary artery  disease, cocaine abuse, and major depressive disorder that was admitted  for chest pain and severe depression with suicidal ideation.  1. Chest pain.  The patient's chest pain is likely induced by the      cocaine use.  He did have 3 sets of negative cardiac enzymes but      had an elevated CK and CK-MB that continued to be elevated      throughout the hospital course, although his  relative index was      normal.  The patient is well known to Cardiology and had seen in      [facility] in the past.  Their recommendations had been to      draw cardiac enzymes and do supportive care.  This recommendation      was followed throughout this hospitalization, although they were      not reconsulted.  We had low level of threshold for reconsulting      them if the patient had further chest pain or if it changed in any      way.  2. Coronary artery disease.  The patient is status post triple bypass      surgery in 2002.  He has a history of coronary artery disease.  The      patient is currently taking aspirin and will continue on aspirin      upon discharge.  3. Major depressive disorder.  The patient has had multiple admissions      to the [facility].  He was restarted on his Remeron      and Geodon during this hospitalization.  Psychiatry was consulted.      Dr. Jeanie Sewer came and left his followup recommendations indicating      that the patient had decreased judgment that he had a cocaine abuse      as well as mood disorder with high relapse risk.  He  was positive      risk for suicide.  The patient was admitted to Psychiatry and was      transferred to [facility] through the [facility].  He      was transferred there for inpatient workup in that facility.  4. Seizure disorder.  The patient had a seizure disorder since      childhood, we chose to continue his Keppra which was prescribed      upon discharge from the [facility] the last time      he was discharged, although he had not been taking it in the last 2      weeks prior to this hospitalization.  The patient was continued on      Protonix and heparin as prophylaxis while he was in the hospital.      He was discharged to [facility] on December 28, 2007.  The      patient was in stable medical condition for transfer.      Jamie Brookes, MD  Electronically Signed      Pearlean Brownie, M.D.  Electronically Signed    AS/MEDQ  D:  01/29/2008  T:  01/30/2008  Job:  [id number]

## 2010-07-07 NOTE — Discharge Summary (Signed)
NAME:  Dalton [name], Dalton [name] NO.:  [id number]   MEDICAL RECORD NO.:  [id number]         PATIENT TYPE:  BIPS   LOCATION:                                [facility]ILITY[facility]   PHYSICIAN:  Jasmine Pang, M.D. DATE OF BIRTH:  05-Mar-1960   DATE OF ADMISSION:  11/27/2007  DATE OF DISCHARGE:  12/13/2007                               DISCHARGE SUMMARY   IDENTIFYING INFORMATION:  This is a 50 year old African American male  who was admitted on a voluntary basis on November 26, 2007.   HISTORY OF PRESENT ILLNESS:  The patient has a history of depression  with suicidal ideation.  He states he smoked enough crack to  blow my  heart up.  He was admitted to the medical unit for chest pain.  He was  then transferred to [place] for further psychiatric stabilization.  He  reports conflict with his mother.  He has other stressors including  financial and stressors with his children.  He reports relapsing on  cocaine.   PAST PSYCHIATRIC HISTORY:  There were 3 back-to-back admissions, last  discharge was November 23, 2007, after 3-week stay in the hospital  voluntarily for suicidal ideation, cocaine abuse.   FAMILY HISTORY:  Not available.   ALCOHOL AND DRUG HISTORY:  As above.  He denies IV drug use.   MEDICAL PROBLEMS:  Coronary artery disease, hypertension, seizures.   MEDICATIONS:  1. Lithium 300 mg b.i.d.  2. Cogentin 1 mg at bedtime.  3. Geodon 60 mg b.i.d.  4. Amantadine 100 mg p.o. b.i.d.  5. Remeron.  6. Aspirin 81 mg daily.  7. Diclofenac ?dose.   DRUG ALLERGIES:  1. IBUPROFEN.  2. NEURONTIN.  3. AMBIEN.   PHYSICAL FINDINGS:  Physical exam was reviewed.  It was done in the  hospital prior to his transfer to [place].  There were no acute physical or  medical problems noted.   ADMISSION LABORATORIES:  Triglyceride was 178, cholesterol was 158,  glucose was 101.  Hemoglobin of 11.1, hematocrit of 32.3.  UDS was  positive for cocaine.   HOSPITAL COURSE:  Upon admission, the  patient was started on hydrocodone  APAP 7.5/500 mg p.o. b.i.d. p.r.n. pain.  He was also restarted on  Cogentin 1 mg p.o. q.h.s., Geodon 60 mg p.o. b.i.d., mirtazapine 45 mg  p.o. q.h.s., Doc-Q-Lace 100 mg daily, polyethylene glycol 17 g daily,  aspirin 81 mg daily.  On November 27, 2007, due to severe constipation, he  was given magnesium citrate 1 bottle every other day p.r.n.  constipation.  On November 27, 2007, hydrocodone was discontinued, instead  he was started on ibuprofen 800 mg t.i.d. for back pain for 14 days.  He  was also started on Ultram 50 mg 1-2 p.o. q.6 hours p.r.n. pain.  On  November 28, 2007, ibuprofen was discontinued.  He was also started on  Ensure q.i.d. p.r.n. because he was not eating well.  In individual  sessions with me, the patient was cooperative though he stayed in bed  much of the time.  He did not want to participate in unit therapeutic  groups and activities initially.  This improved as hospitalization  progressed.  He states he gave up and wanted to kill himself.  He states  he smoked about $1300 worth of crack cocaine to blow up my heart.  He  had a seizure and was brought to the ED.  He denied he was currently  suicidal.  He continued to feel depressed and anxious with poor sleep  and poor appetite and positive suicidal ideation.  He was having some  auditory hallucinations, voices telling him to hurt himself.  On November 30, 2007, due to cough, he was started on Robitussin 10 mL p.o. q.6 hours  p.r.n. cough.  On November 30, 2007, he was given nitroglycerin 0.04  sublingual x1 now for chest pain.  He was also given a chest x-ray to  evaluate an infectious process of cough.  His chest x-ray was negative,  but he continued to have a cough.  The patient developed flu-like  symptoms and was quarantined to his room on droplet precautions.  Sleep  was still difficult.  He was becoming less depressed and less anxious.  He wanted to go to [facility] program after  discharge from here, but was  disappointed to hear that there were no openings at this present time.  As hospitalization progressed, his flu-like symptoms resolved.  He was  able to come out of the room again.  He was less depressed, less  anxious.  He talked with his children and they were supportive.  He  wanted them to take over his finances.  On December 07, 2007, the patient  was started on Protonix 40 mg daily for GERD-like symptoms.  He was also  restarted on his Keppra 500 mg p.o. b.i.d., which had not been started  at the beginning of his admission.  For his cold and flu-like symptoms,  he was given DuoNeb t.i.d. and also ordered multivitamin daily.  The  patient continued to have some difficulty sleeping.  His son visited and  he felt support from him.  His flu-like symptoms were resolving.  He was  released from droplet precautions on December 08, 2007.  He was still  wanting long-term rehab.  On December 09, 2007, the patient was less  depressed and less anxious.  He was worried about his son who had a  seizure the night before.  He was not having any auditory or visual  hallucinations and no suicidal ideation.  He was started on trazodone 50  mg p.o. q.h.s. may repeat x1 p.r.n.  On December 09, 2007, trazodone was  increased to 300 mg p.o. q.h.s. since he states he normally takes 600 mg  p.o. q.h.s.  He was also started on Vistaril 50 mg p.o. t.i.d. p.r.n.  anxiety.  The DuoNeb treatments were discontinued and changed to b.i.d.  p.r.n.  On December 11, 2007, mental status had improved markedly from  admission status.  The patient was continuing to have some difficulty  sleeping due to a stuffy nose.  Appetite was improving.  Mood was less  depressed, less anxious.  Affect consistent with mood.  There was no  suicidal or homicidal ideation.  No thoughts of self-injurious behavior.  No auditory or visual hallucinations.  No paranoia or delusions.  Thoughts were logical and  goal-directed, thought content.  No  predominant theme.  Cognitive was grossly intact.  Insight good,  judgment good, impulse control good.  He stated I think I am going to  be all right.  His son's plan to pick him up today.   DISCHARGE DIAGNOSES:  Axis I:  Mood disorder not otherwise specified.  Cocaine dependence.  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  Axis III:  Coronary artery disease, status post coronary artery bypass  and graft.  Axis IV:  Severe (problems with primary support group, problems related  to social environment, financial problems, medical problems, chronic  substance abuse).  Axis V:  Global assessment of functioning was 50 upon discharge.  GAF  was 35 upon admission.  GAF highest past year was 65.   DISCHARGE PLANS:  There was no specific activity level or dietary  restrictions.   POSTHOSPITAL CARE PLANS:  The patient will continue to try to get in to  the [facility] program.  He will call daily from home to attempts to get a  bed.  He will be seen at [facility] for followup med management  until he gets into [facility].   DISCHARGE MEDICATIONS:  1. Benztropine 1 mg at bedtime.  2. Geodon 60 mg b.i.d.  3. Mirtazapine 45 mg p.o. q.h.s.  4. Colace 100 mg daily.  5. MiraLax 17 g daily and 8 ounces of water.  6. Aspirin 81 mg daily.  7. Protonix 40 mg daily.  8. Keppra 500 mg b.i.d.  9. Multivitamin daily.  10.Trazodone 300 mg p.o. q.h.s.  11.Albuterol inhaler b.i.d. p.r.n. shortness of breath.  12.Vistaril 50 mg p.o. t.i.d. p.r.n. anxiety.      Jasmine Pang, M.D.  Electronically Signed     BHS/MEDQ  D:  12/11/2007  T:  12/11/2007  Job:  [id number]

## 2010-07-07 NOTE — Consult Note (Signed)
NAME:  RAYQUAN, AMRHEIN NO.:  [id number]   MEDICAL RECORD NO.:  [id number]          PATIENT TYPE:  INP   LOCATION:  4732                         FACILITY:  [facility]   PHYSICIAN:  Antonietta Breach, M.D.  DATE OF BIRTH:  1960-10-16   DATE OF CONSULTATION:  12/28/2007  DATE OF DISCHARGE:                                 CONSULTATION   REQUESTING PHYSICIAN:  Denny Levy, M.D.   REASON FOR CONSULTATION:  Cocaine relapse, depression, and suicidal  thoughts with planned overdose.   HISTORY OF PRESENT ILLNESS:  Mr. Endrit Gittins is a 50 year old male  admitted to the [facility] on December 26, 2007, with chest pain.   Mr. Delbene does have a history of an MI in 2002.  He has had a triple  bypass and he did present with chest pain.  He has undergone a general  medical workup and is on unit 4700 of the [facility].   Mr. Duque was recently admitted to a psychiatric ward and discharged  on October 23; however he was not able to obtain his psychotropic  medication.  He has continued with over 7 days of depressed mood, poor  energy, difficulty concentrating, anhedonia, suicidal thoughts.  He also  acknowledges that if he is outside of the hospital he will be at high  risk for cocaine relapse due to its anti-acute depressing effects.   In addition to the above the general medical team had noted that he had  been having some racing thoughts during his general medical care and  support.  His mental symptoms above have not improved.  The ongoing  factor involved in his depression includes unsafe housing and the fact  the he cannot visit his children.   PAST PSYCHIATRIC HISTORY:  In review of the past medical record, Mr.  Journey does have a long-term history of psychiatric symptoms.   In August 2009, Mr. Huesman was in a general medical hospitalization  and he was having depression and suicidal ideation at that time.  He had  been smoking crack cocaine in the  context of his coronary artery  disease.   He was on Cymbalta 60 mg daily at that time, Haldol in an unknown dose,  trazodone.  He also had been taking some Xanax.   In a general hospitalization in July 2009, Mr. Desantis did report  depression and suicidal ideation.  He had made a suicide attempt in  trying to overdose on cocaine.  He was experiencing major depression at  that time with some auditory hallucinations.   Mr. Sliter was taking Cymbalta 30 mg daily,  Geodon 80 mg daily, and  160 mg of Geodon nightly.   He has been tried on other psychotropic medications as mentioned above.   He has undergone a number of psychiatric admissions to the [facility] including a psychiatric admission in July as  well as one recently in September.  He was also in the [facility] and just discharged on October 23.   In August 2007, the patient was also experiencing a depression episode  with suicidal ideation involving cocaine use.   FAMILY PSYCHIATRIC HISTORY:  None known.   SOCIAL HISTORY:  Mr. Lemen has been living in housing but he states  that is not safe.  He also is very troubled by the fact that he cannot  visit his children.  He is unemployed and medically disabled, substance  abuse see above.   PAST MEDICAL HISTORY:  1. Hypertension.  2. Chronic back pain.  3. CAD with a history of MI in 2002 and triple bypass surgery.  4. He has had 3 back surgeries.   MEDICATIONS:  MAR is reviewed.  He is on:  1. Remeron 45 mg nightly.  2. Geodon 60 mg b.i.d.   ALLERGIES:  Include:  1. NEURONTIN.  2. IBUPROFEN.  3. AMBIEN.  4. DILAUDID.  5. NAPROXEN.   LABORATORY DATA:  WBC 10.1, hemoglobin 11.7, platelet count 267, sodium  140, BUN 11, creatinine 1.15, glucose 91, SGOT 53, SGPT 19.  EKG, QTc on November 3, 431 milliseconds.  He was in sinus tachycardia  at that time.   REVIEW OF SYSTEMS:  CONSTITUTIONAL, HEAD, EYES, EARS, NOSE, THROAT,   MOUTH, NEUROLOGIC, PSYCHIATRIC, CARDIOVASCULAR, RESPIRATORY,  GASTROINTESTINAL, GENITOURINARY, MUSCULOSKELETAL, HEMATOLOGIC,  LYMPHATIC, ENDOCRINE, METABOLIC all unremarkable.   PHYSICAL EXAMINATION:  VITAL SIGNS:  Temperature 97.7.  Pulse 78.  Respiratory rate 18.  Blood pressure 116/77.  O2 saturation on room air  100%.  GENERAL APPEARANCE:  Mr. Arrants is a middle-aged male sitting up in  his hospital bed with no abnormal involuntary movements.  His facies are  very downcast.  He has intermittent sobbing   MENTAL STATUS EXAM:  Mr. Partch has poor eye contact with downcast  facies as mentioned above.  His attention span is mildly decreased,  concentration mildly decreased.  Affect is very constricted with  intermittent sobbing.  Mood is depressed.  He is oriented to all  spheres.  His memory is intact to immediate recent and remote.  Speech  is soft with no dysarthria.  Thought process is logical, coherent, goal-  directed.  No looseness of associations.  Thought content, he is having  hopelessness and helplessness, suicidal ideation.  There are no  hallucinations or delusions present.  Insight is partially intact.  Judgment is impaired for self-care outside the hospital.   ASSESSMENT:  Axis I:  293.83 mood disorder not otherwise specified  (idiopathic and substance abuse factors as well as some general medical  factors), depressed.  Cocaine dependence.  Axis II:  Deferred.  Axis III:  See past medical history.  Axis IV:  General medical primary support group.  Axis V:  30.   Mr. Gary is at risk to harm himself.  Also he cannot perform  activities of daily living, but he is ambulatory and can function with  redirection as well as having intact memory function.  He is appropriate  for psychiatric ward in a dual diagnosis program.   He also was at high risk for cocaine relapse.   The undersigned reinforced education with the patient.   RECOMMENDATIONS:  1. Would admit  to an inpatient psychiatric unit for a dual diagnosis      track as soon as possible.  2. Will not change his Remeron 45 mg nightly for anti-depression at      this time given that he was not taking his psychotropic      medications.  3. Will not change his Geodon 60 mg b.i.d. given that he does have a  history of psychosis and Geodon can augment Remeron and anti-      depression.  notes medical transcription please at this to the      laboratory data section I will continue with recommendations.   RECOMMENDATIONS:  We will continue to monitor the patient for stiffness  or other extrapyramidal side effects of the Geodon.   Given that his QTc was over 400 milliseconds, if the Geodon is increased  would recheck a QTc to ensure that it is not prolonged.   RECOMMENDATION:  1. Low stimulation ego support with a reinforcement of the 12-step      method as the patient becomes able.  2. Would continue suicide precautions.      Antonietta Breach, M.D.  Electronically Signed     JW/MEDQ  D:  12/28/2007  T:  12/28/2007  Job:  [id number]

## 2010-07-07 NOTE — Discharge Summary (Signed)
NAME:  Dalton [name], Dalton [name] NO.:  [id number]   MEDICAL RECORD NO.:  [id number]          PATIENT TYPE:  IPS   LOCATION:  0305                          [facility]  [facility]   PHYSICIAN:  Jasmine Pang, M.D. DATE OF BIRTH:  05-26-1960   DATE OF ADMISSION:  11/27/2007  DATE OF DISCHARGE:  12/13/2007                               DISCHARGE SUMMARY   ADDENDUM:  The patient was unable to go home on December 11, 2007, due to lack of  transportation.  There was no change in his condition or treatment plan.  He was able to arrange transportation on December 12, 2097, and was  discharged that day instead.  Discharge medications and plans remained  the same.      Jasmine Pang, M.D.  Electronically Signed     BHS/MEDQ  D:  12/13/2007  T:  12/14/2007  Job:  [id number]

## 2010-07-07 NOTE — Cardiovascular Report (Signed)
NAME:  Dalton [name], Dalton [name] NO.:  [id number]   MEDICAL RECORD NO.:  [id number]          PATIENT TYPE:  INP   LOCATION:  2027                         FACILITY:  [facility]   PHYSICIAN:  Bruce R. Juanda Chance, MD, FACCDATE OF BIRTH:  24-May-1960   DATE OF PROCEDURE:  10/24/2008  DATE OF DISCHARGE:                            CARDIAC CATHETERIZATION   CLINICAL HISTORY:  Dalton [name] is a 50 year old and had previous bypass  surgery in 2003 with a single LIMA graft to the ostial LAD lesion.  He  has had multiple admissions to Psych over the past several years, and  has also had a history of cocaine use.  He presented last night to [facility] with chest pain that was concerning for unstable angina.  He did admit to recent cocaine use, and his drug screen was positive.  We made a decision to evaluate him with angiography.   PROCEDURE:  Left heart catheterization was performed percutaneously via  the femoral artery using arterial sheath and 5-French Preformed coronary  catheters.  A front wall arterial puncture was performed, and Omnipaque  contrast was used.  The patient tolerated the procedure well and left  the laboratory in satisfactory condition.   RESULTS:  The left main coronary artery:  The left main coronary artery  was free of significant disease.   The left anterior descending artery:  The left anterior descending  artery gave rise to a moderate-sized diagonal branch, a moderate-sized  septal perforator, and then several smaller diagonal branches and septal  perforators.  There was mild systolic compression of the LAD in the  midvessel with compression to about 40%.  There is also a 30% ostial  lesion in the LAD.   The circumflex artery:  The circumflex artery gave rise to a small  marginal branch, second large marginal branch, and AV branch, which was  terminated in the posterolateral branch.  These vessels were irregular,  but there was no significant  obstruction.   The right coronary artery:  The right coronary artery was a small to  moderate sized vessel that gave rise to a conus branch, right  ventricular branch, small posterior descending, and 2 small  posterolateral branches.  There were some irregularities in this vessel,  but no significant obstruction.   Subclavian injection:  Subclavian injection showed a widely patent stent  in the subclavian artery.   The LIMA graft:  The LIMA graft to the LAD was completely occluded in  its proximal portion.   The left ventriculogram:  The left ventriculogram performed in the RAO  projection showed small area of apical hypokinesis.  The overall wall  motion was good, and the estimated ejection fraction was 55%.   Hemodynamic data:  The aortic pressure was 113/74 with mean of 91 and  left ventricular pressure is 113/24.   CONCLUSION:  1. Coronary artery disease status post prior bypass surgery in 2003.  2. Nonobstructive native coronary artery disease with 30% narrowing in      the ostium of the LAD, 40% systolic compression in the mid LAD,      irregularities  in the circumflex and right coronary artery.  3. Occluded LIMA graft to the LAD.  4. Mild apical wall hypokinesis with overall preserved LV function      with the estimated ejection fraction of 55%.   RECOMMENDATIONS:  The patient has occluded the LIMA graft, but the  native LAD is nonobstructive and the other two vessels have only minimal  irregularities.  In view of these findings, I suspect his recent chest  pain may be related to his cocaine use and possible spasm.  We will  counsel him on this.  We will probably keep him today and plan to  discharge tomorrow.      Bruce Elvera Lennox Juanda Chance, MD, [facility]  Electronically Signed     BRB/MEDQ  D:  10/24/2008  T:  10/25/2008  Job:  [id number]   cc:   Melvyn Novas, MD

## 2010-07-07 NOTE — H&P (Signed)
NAME:  Dalton [name], Dalton [name] NO.:  [id number]   MEDICAL RECORD NO.:  [id number]          PATIENT TYPE:  IPS   LOCATION:  0507                          [facility]ACILITY:  [facility]   PHYSICIAN:  Anselm Jungling, MD  DATE OF BIRTH:  [birth year]   DATE OF ADMISSION:  12/28/2007  DATE OF DISCHARGE:                       PSYCHIATRIC ADMISSION ASSESSMENT   IDENTIFICATION:  A 50 year old African American male.  This is a  voluntary admission.   HISTORY OF PRESENT ILLNESS:  One of multiple admissions for this 47-year-  old with chronic cocaine abuse and dependence and mood disorder, NOS.  He was discharged from our unit a couple of weeks ago and then returns  again after having relapsed on cocaine following receipt of his first of  the month support check.  He has been consistently on our unit every  month since  July of this year with a similar pattern.  Presented  initially, to cardiology where he had chest pain, voiced some suicidal  thoughts and he was referred to inpatient psychiatry.  He reports he did  not keep his mental health appointments did not take medications  prescribed him at his last discharge on October 21.   PAST PSYCHIATRIC HISTORY:  Multiple inpatient psychiatric admissions for  suicidal thoughts and cocaine dependence.  This is his fifth of  admission since July.  Last here October 5 to the 21, 2009.  Discharge  at that time on Geodon 60 mg p.o. b.i.d. to stabilize his mood.  He  reports he has not been taking it.  He does have a history of up to 3  years abstinent from cocaine, but in the course of the past 1 and 1/2  years has been unable to string together any significant abstinence.  Previously referred to [facility] in [place].  He refused to go  there on the last admission even though they had accepted him.  He has  no prior history of suicide attempts.   SOCIAL HISTORY:  Single African American male, lives in [place].  He  has a son and family up  there, but generally is homeless.  Unemployed.  No income.  No known active legal charges.  Basic education.   MEDICAL PROBLEMS/:  1. Chest pain in the setting of cocaine abuse, coronary artery disease      with history of a triple bypass in 2002.  2. History of seizure disorder since childhood.   CURRENT MEDICATIONS:  On transfer from the medical unit are:  1. Aspirin 81 mg daily.  2. Colace 100 mg p.o. daily.  3. MiraLax 17 grams daily.  4. Keppra 500 mg p.o. b.i.d.  5. Vytorin 10/20 mg daily.  6. Geodon 60 mg p.o. b.i.d.  7. Remeron 45 mg daily.   DRUG ALLERGIES:  GABAPENTIN, IBUPROFEN, ZOLPIDEM, HYDROMORPHONE,  NAPROXEN.   He had previously taken Plavix, but that was not restarted on the  medical unit.  He is currently on aspirin daily.   Physical exam was done on the medical unit as noted in the record.   Diagnostic studies were remarkable for negative cardiac enzymes and  urine  drug screen positive for cocaine.   ADMITTING OF MENTAL STATUS EXAM:  Revealed a fully alert gentleman  oriented x4, large build, dysphoric and depressed in mood.  No signs of  internal distraction or homicidal thought, endorsing some passive  suicidal thoughts with no plan for suicide stating that he feels  awful.  His cognition is fully preserved.  He is in no acute distress  today.   AXIS I:  Substance-induced mood disorder, cocaine dependence.  AXIS II:  Deferred.  AXIS III:  1.  Chest pain in the setting of cocaine abuse.  2.  Seizure  disorder.  3.  Coronary artery disease.  AXIS IV:  Severe homelessness.  AXIS V:  Current 48.  Past year not known.   PLAN:  To admit him to our dual diagnosis program.  He has agreed to go  to [facility] and we are going to make those arrangements and we  will continue his routine medications at this time.      Margaret A. Lorin Picket, N.P.      Anselm Jungling, MD  Electronically Signed    MAS/MEDQ  D:  12/29/2007  T:  12/29/2007  Job:   [id number]

## 2010-07-07 NOTE — H&P (Signed)
NAME:  Dalton [name], Dalton [name] NO.:  [id number]   MEDICAL RECORD NO.:  [id number]          PATIENT TYPE:  OBV   LOCATION:  A321                          FACILITY:  [facility]   PHYSICIAN:  Skeet Latch, DO    DATE OF BIRTH:  1960-07-18   DATE OF ADMISSION:  11/24/2007  DATE OF DISCHARGE:  LH                              HISTORY & PHYSICAL   CHIEF COMPLAINT:  Chest pain.   PRIMARY MEDICAL DOCTOR:  The patient is unassigned.   HISTORY OF PRESENT ILLNESS:  This is a 50 year old African American male  who has a history of coronary artery disease, who had a CABG in 2002,  who is being seen by Behavioral Health for psychiatric issues, including  suicidal ideation.  The patient has been feeling depressed.  He has a  history of crack cocaine abuse and states that last evening he was  having some chest pain, felt depressed and decided to do crack cocaine.  He continued to have chest pain and also continued to do crack cocaine.  The patient had a previous episode and was admitted to [facility] back on September 26, 2007, and discharged September 27, 2007, with  similar complaints.  The patient's chest pain was on his left side with  radiation to his shoulder and left side of his neck.  He does have some  shortness of breath, dizziness and light headedness.  The patient denies  any abdominal pain, fever, chills, cough, and states that the pain was  prior to using cocaine, but continued all night and decided to come to  the emergency room today.   HOME MEDICATIONS:  Aspirin daily.  All his meds have no frequency in  dose.  I believe the patient has been out of medications for over 1-  month, and include Cymbalta, Geodon, Plavix, trazodone, Vicodin, Xanax,  haloperidol, dilantin.   PAST MEDICAL HISTORY:  1. Coronary artery disease, status post CABG.  He had a heart cath in      2003, this showed 50% stenosis in the left anterior descending      artery.  The patient did have a  stress test in the year 2000, that      did not show any ischemia, ejection fraction was 47%.  2. History of polysubstance abuse.  3. Depression.  4. Psychosis.  5. Seizure disorder.   DRUG ALLERGIES:  1. IBUPROFEN.  2. NEURONTIN.  3. AMBIEN.  4. NAPROXEN.   PAST SURGICAL HISTORY:  Multiple back surgeries and knee surgeries.   SOCIAL HISTORY:  The patient was living in [facility] with [employer] in [place].  I am not sure if the patient is homeless.  The  patient has been seen previously and admitted to [facility].  The  patient does have a history of cocaine abuse.  Denies alcohol or smoking  cigarettes.   FAMILY HISTORY:  Father with coronary artery disease and diabetes.   REVIEW OF SYSTEMS:  GENERAL:  Weakness.  HEENT:  Unremarkable.  CARDIOVASCULAR:  Positive for chest pain.  No palpitations.  RESPIRATORY:  Unremarkable.  GI/GU:  Unremarkable.  Other systems unremarkable.   PHYSICAL EXAMINATION:  VITAL SIGNS:  Temperature 97.9, respiratory rate  28, pulse 93, blood pressure 116/59.  GENERAL:  He seems to be in no acute distress.  The patient seems  depressed.  He is well-developed and well-nourished, well-hydrated.  HEENT:  Head is atraumatic, normocephalic.  No scleral icterus.  Oral  mucosa moist.  NECK:  Supple.  Positive for some left sided neck pain.  LUNGS:  Clear bilaterally.  No rales, rhonchi or wheezing.  CARDIOVASCULAR:  Regular rate and rhythm.  No murmurs appreciated and no  bruits, rubs or gallops.  ABDOMEN:  Nontender, nondistended.  Positive bowel sounds.  No rigidity  or guarding.  EXTREMITIES:  No clubbing, cyanosis or edema.  NEUROLOGIC:  He is alert and oriented x3.  Cranial nerves II through XII  are grossly intact.   LABORATORY DATA:  CK-MB is 36.1, troponin less than 0.05.  White count  is 8.0, hemoglobin 12.3, hematocrit 34.6, platelet count 209, sodium  138, potassium 4.3, chloride 106, CO2 is 24, glucose 142, BUN 16,   creatinine 1.6, calcium 9.9.  Dilantin level is less than 2.5.  His  urine drug screen was positive for cocaine.  Chest x-ray; limited study  by poor inspiration, status post median sternotomy, bilateral basilar  atelectasis, no acute infiltrate or edema.  EKG showed no specific ST-T  changes, normal axis, no Q-wave changes, normal rhythm, 98 BPMs.   ASSESSMENT:  1. Cocaine induced chest pain.  2. History of polysubstance abuse.  3. Acute renal failure.  4. History of psychiatric disorder.   PLAN:  1. The patient will be admitted to the service of [facility].  2. Observation in telemetry unit.  3. For his cocaine induced chest pain, will get a repeat EKG, will get      a lipid panel profile in the morning.  ER physician has spoken with      [facility].  The patient will be a rule out with no      invasive procedures needed at this time.  The patient will be      placed on his home medications previously.  4. For his history of psychiatric disorder, will place the patient on      his psychiatric medications once we have the doses available.  The      patient does not admit to having suicidal ideation at this time.  I      do not believe he needs a sitter at this time.  Will consult ACT      team at this time.  5. The patient has a history of seizures.  Will start him on dilantin      200 mg three times a day.  6. The patient will be on deep venous thrombosis, as well as      gastrointestinal prophylaxis.  7. The patient is stable, will be on observation.  Rule out for      coronary syndrome seems to be unremarkable, and the patient seen by      the ACT team.  The patient will be discharged.      Skeet Latch, DO  Electronically Signed     SM/MEDQ  D:  11/24/2007  T:  11/24/2007  Job:  [id number]

## 2010-07-07 NOTE — Consult Note (Signed)
NAME:  DENI, Dalton [name] NO.:  [id number]   MEDICAL RECORD NO.:  [id number]          PATIENT TYPE:  EMS   LOCATION:  MAJO                         FACILITY:  [facility]   PHYSICIAN:  Antonietta Breach, M.D.  DATE OF BIRTH:  May 29, 1960   DATE OF CONSULTATION:  09/06/2007  DATE OF DISCHARGE:                                 CONSULTATION   REFERRING PHYSICIAN:  Zadie Rhine, MD.   REASON FOR CONSULTATION:  Depression and suicidal ideation.   HISTORY OF PRESENT ILLNESS:  [name] is a 50 year old male  presenting with severe depression and suicide attempt.  The patient  overdosed on cocaine prior to his presentation in order to kill himself.  He developed severe chest pain and was brought to the emergency room.   The patient has several days of depressed mood, anhedonia, thoughts of  hopelessness and helplessness and suicidal thoughts.  He has also been  having some auditory hallucinations.  He states that he just wants to go  see his father and his father is deceased.   The patient is cooperative with healthcare staff.  He is not combative.  His facies are very downcast.   PAST PSYCHIATRIC HISTORY:  The patient has undergone at least four  psychiatric admissions in the past.  In review of the past medical  record, there are four behavioral health admissions at [facility].   In August 2007, the patient was admitted for depression and suicidal  ideation.  He had also been using cocaine at that time.  He was  diagnosed with psychotic disorder, not otherwise specified and  polysubstance dependence.  He was discharged on Cymbalta 30 mg daily and  Geodon 80 mg in the morning and 160 mg nightly.   The patient's past psychotropic trials include Cymbalta, Geodon, Xanax  and trazodone.   FAMILY PSYCHIATRIC HISTORY:  None known.   SOCIAL HISTORY:  The patient is homeless.  He is from [place], [place].  He has a child.  He is unemployed.  He has  a history of  cocaine abuse.   PAST MEDICAL HISTORY:  1. Coronary artery disease.  2. Hypertension.  3. History of myocardial infarction.  4. Seizure disorder.   MEDICATIONS:  His MAR is reviewed.  Currently, the patient is on an  aspirin daily.   ALLERGIES:  1. NEURONTIN.  2. IBUPROFEN.  3. AMBIEN.  4. HYDROMORPHONE.  5. NAPROXEN.   LABORATORY DATA:  CK-MB normal.  Troponin-[name] less than 0.05.  Alcohol  negative.  Sodium 141, BUN 9, creatinine 1.51, glucose 104, SGOT 26,  SGPT 15.  Urine drug screen positive for cocaine.   WBC 6.8, hemoglobin 14.4, platelet count 239.   REVIEW OF SYSTEMS:  Constitutional, head, eyes, ears, nose and throat,  mouth, neurologic, psychiatric, cardiovascular, respiratory,  gastrointestinal, genitourinary, skin, musculoskeletal, hematologic,  lymphatic, endocrine and metabolic all unremarkable.   PHYSICAL EXAMINATION:  VITAL SIGNS:  Temperature 97.2, pulse 84,  respiratory rate 20, blood pressure 102/54.  O2 saturation on 2 liters  98%.  GENERAL APPEARANCE:  [name] is a middle-aged male lying in  a  supine position in his hospital bed with no abnormal involuntary  movements.   MENTAL STATUS EXAM:  [name] is alert.  His eye contact is good.  He is oriented to all spheres.  His attention span is mildly decreased,  concentration is mildly decreased.  His affect is constricted.  His mood  is depressed.  His facies are very downcast.  His memory is intact to  immediate, recent and remote.  He is oriented to all spheres.  His fund  of knowledge and intelligent or within normal limits.  His speech is  soft with normal rate and prosody.  Thought process logical, coherent  and goal-directed.  No looseness of associations.  Thought content,  please see the history of present illness.  Insight is intact to the  treatment.  Judgment is intact for the need of psychiatric treatment.   ASSESSMENT:  [name]:  1. 293.83, mood disorder, not  otherwise specified, depressed.  2. 293.82, psychotic disorder, not otherwise specified.  3. Cocaine dependence.  AXIS II:  Deferred.  AXIS III:  See past medical history.  AXIS IV:  Global Assessment of Functioning 30.   [name] is at risk for suicide.   RECOMMENDATIONS:  1. Would continue suicide precautions.  2. Would admit to a psychiatric inpatient unit as soon as possible.  3. Psychotropic medication deferred.      Antonietta Breach, M.D.  Electronically Signed     JW/MEDQ  D:  09/06/2007  T:  09/06/2007  Job:  [id number]

## 2010-07-07 NOTE — Discharge Summary (Signed)
NAME:  Dalton [name], Dalton [name] NO.:  [id number]   MEDICAL RECORD NO.:  [id number]           PATIENT TYPE:   LOCATION:                                 FACILITY:   PHYSICIAN:  Pearlean Brownie, M.D.DATE OF BIRTH:  [birth year]   DATE OF ADMISSION:  DATE OF DISCHARGE:                               DISCHARGE SUMMARY   PRIMARY CARE Dalton [name]:  This patient has no primary care Dalton [name].   DISCHARGE DIAGNOSES:  1. Chest pain with three sets of negative cardiac enzymes.  2. Major depressive disorder with psychotic features.  The patient is      currently having suicidal ideation.  3. Coronary artery disease.  The patient has a long history of      coronary artery disease with triple bypass surgery in 2002.  4. Seizure disorder.  He has had seizure disorder since childhood and      has had no seizures during hospitalization.   DISCHARGE MEDICATIONS:  1. Aspirin 81 mg p.o. daily.  2. Colace 100 mg p.o. daily.  3. MiraLax 17 grams in 8 ounces of water p.o. day.  4. Keppra 500 mg p.o. b.i.d.  5. Vytorin 10/20 p.o. daily.  6. Geodon 60 mg p.o. b.i.d.  7. Remeron 45 mg p.o. q.h.s.   CONSULTATIONS:  Dr. Jeanie Sewer was consulted and did come on December 28, 2007.   PROCEDURES:  1. Chest x-ray on December 26, 2007, that showed low lung volumes and      possible component of interstitial edema.  2. EKG on December 26, 2007, that showed sinus tachycardia and      nonspecific ST and T-wave abnormalities.   LABORATORY DATA:  On December 26, 2006, CBC showed a white blood cell  count of 10.1, hemoglobin of 11.7 and platelets of 267 with neutrophils  at 81%.  On December 26, 2007, he had a had a sodium of 141, a potassium  of 3.8, chloride of 105, carbon dioxide of 27, BUN of 13, creatinine of  1.5.  He had a glucose of 110, total bilirubin of 1.3,  an alkaline  phosphatase of 57, AST of 53, ALT of 19, total protein 8.4, albumin in  the blood at 4.2 and calcium of 9.9.  He had a  urinalysis on December 26, 2007, that showed 15 ketones.  The rest of the urinalysis was completely  normal.  He had a urine drug screen on December 26, 2007, that was  positive for cocaine.  He had a point of care cardiac markers that  showed a CK-MB of 12.6, troponin I of less than 0.05, and a myoglobin of  greater than 500.  He had a repeat cardiac panel that showed a  creatinine kinase of 3285 as well as a CK-MB of 8.5 with a relative  index 0.3.  He had troponin I of 0.01.  He had a beta natruretic peptide  that was 58, on November 4 he had a cardiac panel that showed a total  creatinine kinase was 3154, CK-MB of 7.30, relative index of 0.2 and a  troponin I 0.02.  He  had on the day before discharge basic metabolic  panel that was completely normal except for potassium of 3.2 and his  final cardiac panel showed that he had a creatine kinase of 2663 with a  CK-MB of 5.5 and relative index 0.2.  Troponin I of less than 0.01   BRIEF HOSPITAL COURSE:  1. This is a 50 year old male that comes into the hospital complaining      of chest pain for about one day.  The chest pain was left-sided.      It was chest pain that turned into a chest throbbing sending      weakness and pain down his left arm.  The patient also had some      depression and had a very flat affect on admission.  The patient      also said that he had been on none of his medications for the last      2 weeks because he could not afford them, and he ran out of his      samples.  The patient has been previously diagnosed with major      depressive disorder with psychotic features.  He was recently      discharged from the [facility], it looks like on      October 23.  The patient did endorse suicidal thoughts and      ideations and did seem to have a plan to take his old medications      out of the bottles and take them all at one time.  For the chest      pain, the patient had three cardiac enzymes that ruled  out as      negative.  His CK and CK-MB were both elevated during his      hospitalization but the relative index was normal.  2. Had major depressive symptoms.  He has had multiple medical      admissions to the Behavioral Health facilities in this area      including [address], [address] and [place].  Initially we held      the Remeron and Geodon concern of cardiac causes.  However on the      second day of admission, his Remeron and Geodon were restarted, and      we got a psych consult which did diagnose him with major depressive      disorder and felt that he would benefit from a transfer to      [facility].  3. Coronary artery disease.  The patient was continued on aspirin as      he has had a triple bypass surgery in 2002.  4. Seizure disorder.  The patient had had this seizure disorder since      childhood.  He was continued on Keppra which was the medication he      was supposed to go home on from the [facility].  We      continued his prior dose.  5. Prophylaxis.  The patient was kept on Protonix and heparin.   DISPOSITION:  The patient will be discharged in stable medical condition  to the [facility] for follow-up on his psych issues.  The  patient was discharged on November 27, 2007 to [facility] in  stable medical condition.      Jamie Brookes, MD  Electronically Signed      Pearlean Brownie, M.D.  Electronically Signed  AS/MEDQ  D:  12/28/2007  T:  12/28/2007  Job:  [id number]

## 2010-07-07 NOTE — H&P (Signed)
NAME:  ROBERTS, BON NO.:  [id number]   MEDICAL RECORD NO.:  [id number]          PATIENT TYPE:  IPS   LOCATION:  0406                          FACILITY:  [facility]   PHYSICIAN:  Anselm Jungling, MD  DATE OF BIRTH:  02-Apr-1960   DATE OF ADMISSION:  09/06/2007  DATE OF DISCHARGE:                       PSYCHIATRIC ADMISSION ASSESSMENT   TIME:  10:15 a.m.   IDENTIFICATION:  A 50 year old [place] American male, single.  This is a  voluntary admission.   HISTORY OF PRESENT ILLNESS:  This is the seventh [facility] admission for this  50 year old gentleman who presented in the emergency room with acute  chest pain.  At that time, he also complained of some suicidal thoughts,  thinking that he would go ahead and overdose on cocaine.  He denies any  prior history of suicide attempts.  He says that he was abstinent from  alcohol for close to a year before relapsing about 2 months ago.  He has  been using cocaine most every day, was smoking rock and last used on the  night prior to admission.  He has been drinking significance amounts of  alcohol saying he was drinking as much as I could get.  Had thoughts  of either overdosing on cocaine or possibly jumping off of a bridge that  he had identified.  This was compounded by the fact that he was having  financial stressors after having spent his money on drugs and his  housing was at risk.  He also endorses some grief over the death of his  father earlier this year.  Urine drug screen was positive for cocaine.  No homicidal thoughts.  Please note that he had complained of some  auditory hallucinations with some commands that he should just kill  myself by jumping off of a bridge.   [facility]  October 02, [facility] through October 12, 2005.  He has a history of  psychosis, NOS including some auditory hallucinations.  A long history  of cocaine abuse.  History of suicidal gestures  including some wrist  cutting and he is not currently under any outpatient treatment.  This  past period was his longest period ever completely abstinent from  substances.   SOCIAL HISTORY:  Completed high school.  Currently unemployed.  He is  currently homeless after being unable to afford his rent and hopes to  qualify for section 8 housing.  He is on disability for multiple medical  problems including a history of coronary artery disease with previous  bypass.  He has Medicaid and Medicare to help pay for services.  He has  49 year old and 43 year old sons with his eldest son just having  graduated from [employer].   FAMILY HISTORY:  Not available.   MEDICAL HISTORY:  No current outpatient care.  Current medical problems  are chest pain with a negative evaluation in the emergency room.  He has  a history of CAD with previous cardiac bypass.  Previous myocardial  infarct.  History of chronic constipation.  History of seizures with  withdrawal.  Chronic back pain with 3 prior back surgeries.   CURRENT MEDICATIONS:  No medications at all in at least 1 month.  Prior  to that he was taking:  1. Aspirin 81 mg daily.  2. Geodon 60 mg in the morning, 120 mg in the evening.  3. Cymbalta 60 mg daily.  4. Plavix 75 mg p.o. daily.  5. Trazodone 300 mg p.o. q.h.s.  6. Vicodin 10/325 p.o. q.6 h. p.r.n. for back pain.  7. Dilantin 100 mg t.i.d.  8. Xanax 1 mg p.o. 1 in the morning and 1 at bedtime.   ALLERGIES:  1. AMBIEN.  2. NEURONTIN.  3. IBUPROFEN.  4. NAPROXEN.   PHYSICAL EXAMINATION:  GENERAL:  Physical exam was done in the emergency  room as noted in the record.  This is a fully alert gentleman who is in  no acute distress.  Has not actually displayed any withdrawal symptoms  at this point.  Was cooperative in the emergency room.  VITAL SIGNS:  5 feet 10 inches tall, 244 pounds, temperature 97.8, pulse  67, respirations 20, blood pressure 139/91.   LABORATORY  DATA:  Cardiac enzymes revealed a troponin-I  level of less  than 0.01.  Cardiac markers were otherwise unremarkable.  His alcohol  level in the emergency room was less than 5.  Comprehensive metabolic  panel:  Sodium 141, potassium 4.5, chloride 99, carbon dioxide 31, BUN  9, creatinine 1.51 and random glucose 104.  Urine drug screen was  positive for cocaine, negative for all other substances.  CBC:  WBC 6.8,  hemoglobin 14.4, hematocrit 42.4, platelets 239,000, MCV 88.5.  Liver  enzymes:  SGOT 26, SGPT 15, alkaline phosphatase 74 and total bilirubin  is 0.8.   MENTAL STATUS EXAM:  Fully alert African American male, rather  reclusive.  Normal speech, resistant to interview though.  Offers very  little.  Lies in the bed with his face turned into the pillow.  Speech  is normal.  Affect is a little bit irritable.  Mood depressed and  irritable.  He is upset about his relapse.  Was proud of the fact that  he could maintain a lengthy period of abstinence for him.  Regrets the  relapse.  Thought process is logical.  Denying any hallucinations today  or active suicidal thoughts.  Feeling a little bit agitated, but speech  is normal.  Thinking is linear, goal-directed.  No delusional statements  made.  No guarding or paranoia other than the resistance to interview.  Cognitively, he is fully intact.   AXIS I:  Psychosis, no other symptoms.  Alcohol abuse, rule out  dependence.  Cocaine abuse, rule out dependence.  AXIS II:  Deferred.  AXIS III:  History of chronic constipation.  Acute chest pain with  negative evaluation in the ED.  Coronary artery disease with previous  bypass.  Chronic back pain with 3 prior surgeries.  History of seizure  with withdrawal.  AXIS IV:  Moderate.  Chronic financial problems and housing problems,  now homeless.  AXIS V:  Current 42, past year not known.   PLAN:  Voluntarily admit him.  We are going to start him on Symmetrel  100 mg p.o. b.i.d.  Will resume  his aspirin and his trazodone.  We have enrolled in our  intensive care unit for starters and will participate in dual diagnosis  programming.      Margaret A. Lorin Picket, N.P.      Anselm Jungling, MD  Electronically Signed    MAS/MEDQ  D:  09/15/2007  T:  09/15/2007  Job:  [id number]
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
## 2010-07-07 NOTE — H&P (Signed)
NAME:  Dalton [name], BORN NO.:  [id number]   MEDICAL RECORD NO.:  [id number]          PATIENT TYPE:  INP   LOCATION:  4732                         FACILITY:  [facility]   PHYSICIAN:  Nestor Ramp, MD        DATE OF BIRTH:  1960-08-16   DATE OF ADMISSION:  12/26/2007  DATE OF DISCHARGE:                              HISTORY & PHYSICAL   CHIEF COMPLAINT:  Chest pain and suicidal ideation.   HISTORY OF PRESENT ILLNESS:  The patient has been feeling chest pain  since last night.  It has gotten worse, and at about 11 a.m. this  morning it was pretty bad.  The patient says that it was left-sided pain  that was going down his left arm.  He had some shortness of breath and a  little bit of nausea, but no vomiting.  He took some ginger ale to make  it feel better but no medications.  He did smoke some crack cocaine at  1:30 p.m. this afternoon.  Nothing made him feel better.  In fact he  says the cocaine made his chest pain feel worse.  It was a sharp pain  that was on the left side near his shoulder.  It then became a throbbing  pain that went down his left arm.  It began to make his arm weak with  numbness and tingling.  He was sitting in his chair when the pain and  numbness and tingling came on.  He says that the pain is not associated  with exercise.  Does did not make it better or worse.  He called the  EMS.  He got 1 nitroglycerin tablet in the ambulance which did not  relieve his pain, although he says in the past he has taken up to 4  nitroglycerin to relieve his pain.  He has about 2 of these chest pain  type episodes a year per patient.   PAST MEDICAL HISTORY:  Significant for hypertension and chronic back  pain.  He had a myocardial infarction in 07/28/00 status post a triple  bypass.  He has major depressive disorder with psychotic features, and  he also has coronary artery disease.   PAST SURGICAL HISTORY:  The patient has had 3 back surgeries in the past  and a  coronary artery bypass in July 28, 2000.   FAMILY HISTORY:  Significant for a father that died of an MI in 2002-07-29.  He was 49 years old.  He did have hypertension.  Mother that died of an  MI at age 15 in Jul 28, 2001 who also had hypertension.  A sister who also died  of an MI in 2005/07/28.  He could not remember if her age was 90 or 42.  He  also has a brother and some nephews with coronary artery disease.   SOCIAL HISTORY:  The patient denies alcohol, drugs or smoking.  However,  he was positive for cocaine on his urine drug screen.  The patient says  that his housing is not safe, that he cannot visit with his children.  He is depressed.  He  has had multiple admissions to [facility].  He claims that recently he has been having increased weight  loss, that he is not sleeping well, but that he is in the bed more.  He  has increased crying.  His thoughts are just racing.  He does have some  suicidal thoughts.  He considered taking some bottles of his old pills,  but he was able to contract for safety at the time of admission.  The  patient was discharged.  Of note from the [facility] on  October 23rd is supposed to be on multiple medications for which he is  not taking any of his medications at this time.   PHYSICAL EXAMINATION:  The patient had a temperature of 98.2, his pulse  was 91, his blood pressure was 102/61, respirations of 18, and an oxygen  saturation of 100% on 2 liters.  GENERAL:  The patient is obese.  He has a flat affect.  He is depressed-  appearing.  He is in no acute distress.  HEENT:  Showed a normocephalic, atraumatic male with anisocoria on his  pupils.  His right pupil is larger than his left pupil, but patient  states that this is his baseline after a blow to the head during a  boxing match.  Both pupils are reactive to light.  His tympanic  membranes are gray and nonbulging.  NECK:  Supple.  He has no JVD.  He has no bruits.  The patient has no tenderness  to palpation in his chest.  The chest pain  is nonreproducible. LUNGS:  Clear to auscultation bilaterally.  No  wheezes or crackles.  ABDOMEN:  Large.  He is obese.  He is nontender and nondistended.  However, he has positive bowel sounds.  No organomegaly.  EXTREMITIES:  Show no clubbing, cyanosis.  He does have good pulses on  his dorsalis pedis and posterior tibial pulses.  The patient has intact cranial nerves II-XII.  His strength is 5/5 in  the upper extremities and lower extremities.  PSYCHIATRIC:  He has a depressed affect.  He has a rather flat affect,  but he showed no signs of delusions or hallucinations.  The patient did  voice some suicidal tendencies, but was able to contract for safety at  the time.   LABORATORIES:  BMET with sodium of 141, a potassium of 3.8, chloride of  105, bicarb of 27, BUN of 13, and a creatinine of 1.5 with a blood  glucose of 110.  The CBC showed a white blood cell count of 10.1,  hemoglobin of 11.7, hematocrit of 34.4, and platelets of 267.  He had an  81% neutrophil.  A1c of 8.1.  His total protein was 3.4.  The patient's  albumin was 4.2.  His AST was 53, ALT 19.  T bili was 1.3.  His UA was  negative except for 15 ketones.  His urine drug screen was positive for  cocaine.  His point of care enzymes showed a myoglobin of greater than  500, CK-MB of 12.6, his troponin was less than 0.05.  On repeat cardiac  enzymes, his creatinine kinase was 3285, CK-MB was 3.8, his troponins  were 0.01.  His BNP was 58.   His EKG showed sinus tachycardia with no ST depression or elevation.  His chest x-ray showed low lung volumes which is a possible component of  interstitial edema.   ASSESSMENT/PLAN:  This is a 50 year old [place] American male with  coronary artery disease, cocaine abuse, and major depression that has  had multiple admissions for chest pain and for depression.   1. Chest pain.  This is likely related to his depression and cocaine       use as it seems to worsen with his cocaine use.  However, it did      have an onset before he used the cocaine.  His troponins have been      negative x2, but his CK and CK-MB are elevated with a normal ratio.      This is likely due to his cocaine use and some mild dehydration.      The patient did have an EKG with nonspecific ST changes.  He had no      ST-T wave depression or elevation.  Plan is to get cardiac enzymes      x2 on him.  Will provide the patient with nitroglycerin sublingual      tablets as his blood pressure tolerates.  If the nitroglycerin is      ineffective, we will try morphine.  The patient did not receive      morphine in the ED.  He will continue on his oxygen as needed.  The      patient was previously on Plavix, but will be continued in-house on      aspirin 81 mg daily.  The patient will be sent to a telemetry bed,      and will receive an EKG in the morning.  We will avoid beta      blockers as this has been the recommendation by [facility]      in the past, and they have been consulted in his same situation of      cocaine use.  2. Coronary artery disease.  At the time of admission we did restart      his Plavix and statin. However, with further consideration we will      discontinue his Plavix at this time.  His fasting lipid panel was      on October 3, and it showed a total cholesterol of 226,      triglycerides 178, HDL of 32, and LDL of 158.  3. Major depression/suicidal thoughts.  This patient has been to      multiple behavioral health centers at [facility] and [address] and      [place].  His recent discharge from the [facility] on October 23rd stated a similar situation to the one he is      in now.  The patient was able to contract for safety, and did      promise to call the nurse if he had any suicidal thoughts.  At this      time it is not thought that a sitter is needed.  However, a sitter      will be ordered for  the patient if he has any further suicidal      thoughts.  The patient has been admitted to [facility] in      the past.  Will likely need an additional psychiatric consult      during that hospitalization, but one was not ordered yet.  The      patient had previously been discharged from the [facility] on Remeron and Geodon, but these were not restarted at this      time.  We may consider restarting these medications in the morning      after he has been ruled out for a myocardial infarction.  4. Seizure disorder.  The patient claims to have a seizure disorder      since childhood.  He was previously on Dilantin.  He is currently      on Keppra.  We will continue him on Keppra.  5. Prophylaxis.  The patient will be put on Protonix and heparin      during his hospitalization to prevent gastric ulcer and deep venous      thrombosis.  6. Disposition.  This patient will likely go home quickly.  He is      pending cardiac rule out and stabilization of his psychiatric      baseline right now.  Also pending his renal function returning to      baseline at 1.17.  The patient is able to drink fluids so we will      encourage p.o. fluids and get a blood pressure and BMET in the      morning.      Jamie Brookes, MD  Electronically Signed      Nestor Ramp, MD  Electronically Signed    AS/MEDQ  D:  12/27/2007  T:  12/27/2007  Job:  [id number]

## 2010-07-07 NOTE — H&P (Signed)
NAME:  Dalton [name], Dalton [name] NO.:  [id number]   MEDICAL RECORD NO.:  [id number]          PATIENT TYPE:  INP   LOCATION:  2908                         FACILITY:  [facility]   PHYSICIAN:  Darryl D. Prime, MD    DATE OF BIRTH:  1960/07/29   DATE OF ADMISSION:  10/23/2008  DATE OF DISCHARGE:                              HISTORY & PHYSICAL   The patient is a full code.   PRIMARY CARE PHYSICIAN:  Melvyn Novas, M.D., of [place].   He has no cardiologist.   CHIEF COMPLAINT:  Chest pain.   HISTORY OF PRESENT ILLNESS:  Dalton [name] is a 50 year old male with a  history of coronary artery disease status post single-vessel bypass  surgery in 2003 for a LIMA to an LAD for an ostial LAD lesion.  The  patient has a history of cocaine use, chronic with recent use, history  of depression with suicidal attempts in the past, who presents with  chest pain.  The patient notes he was in [place] around October 2009  and had chest pain and apparently underwent percutaneous coronary  intervention at that time.  Placed on Plavix.  The patient recently over  the last week has run out of a lot of his medications, including Plavix  and aspirin for the last 6 days.  The patient notes cocaine, crack use  within the last 24 hours.  The patient notes chest pain only with  exertion every other week or so.  He plays basketball with his son.  It  occurs after walking, particularly on an incline.  The patient notes  over the last week and half, on-and-off chest pain more frequent and  severe.  He describes chest pain today when walking from across town  around 8:30 a.m.  He would stop multiple times before getting home  eventually.  He got home this afternoon.  The pain is described as a  tightness on the left side of the chest with associated shortness,  radiation to bilateral neck, left arm with associated sweats, shortness  of breath, and nausea.  The patient had 10/10 chest pain today  when he  got home.  He tried to relieve the pain by just staying still.  Laying  down did not relieve the pain.  He took an inhaler, which he takes  chronically, but this also did not help the pain or shortness of breath.  He then called 911.  EMS gave him aspirin 324.  He was brought to the ED  at [facility] and given morphine 2 mg IV, nitro paste, then a nitro  drip, Zofran IV, heparin bolus 4000 at 1821, and then a drip.  The  patient was given fentanyl en route here, 50 mg IV, but still had  persistent chest pain at the time of his interview, a 5/10.  He was then  given Ativan IV as well as nitro sublingual which dropped the pain to  3/10.  The patient's cardiac markers have been unremarkable there  initially.  EKG showed no acute changes.   PAST MEDICAL AND SURGICAL HISTORY:  Is as  above.  He has a history of  depression with suicidal thoughts, the last bout in November 2009.  The  patient has a history of coronary artery disease, as above.  He notes  multiple MIs before 20-Jul-2000.  He has an LV systolic dysfunction history.  No echo available here but cath in July, 2003.  His EF was preserved.  There is a mention in his chart of mild LV systolic dysfunction.  He has  a history of psychosis, not otherwise specified, history of seizure  disorder, history of knee surgery on the left, history of  COPD/bronchitis chronically, history of carpal tunnel surgery on the  right, history of a gastric ulcer in Jul 20, 2001 related to ibuprofen use.   ALLERGIES:  He is allergic to GABAPENTIN, IBUPROFEN, as above, ZOLPIDEM,  HYDROMORPHINE.   MEDICATIONS:  1. Vytorin 10/20 daily.  2. Levetiracetam 500 mg twice a day.  3. Isosorbide mononitrate 30 mg ER daily.  4. Hydrocodone/acetaminophen, he is out of this, 5/500 as needed.  5. Nabumetone 750 mg b.i.d. p.r.n.  6. Naprosyn 500 mg p.r.n. b.i.d.  7. Metoprolol 25 mg twice a day.  8. Tizanidine 4 mg every 8 hours as needed.  9. Lyrica 75 mg samples as  needed.  10.Depakote ER 750 mg daily.  He ran out of this here recently.  11.Plavix 75 mg daily.  Out of Plavix for the last 6 days.  12.Aspirin 81 mg.  Out of aspirin for the last 6 days.   FAMILY HISTORY:  Positive for premature coronary artery disease.  He  notes his father died due to complications of a myocardial infarction at  the age of 47 but had coronary artery disease pre-dating  this at least  10 years.  Had a history of hypertension as well.  He notes having a  mother who died due to complications of an MI at the age of 47.  Sister  died of an MI in 07/20/2005.  She was in her late 62s.  He notes a brother  with coronary artery disease and nephew also with coronary artery  disease.   SOCIAL HISTORY:  Is currently living with his son.  Denies any alcohol  or tobacco abuse.  He notes cocaine use recently, as above.   The patient notes his review of systems is positive for suicidal  thoughts with no plans.  No homicidal ideation.  No hallucinations.  Other 14-point review of systems negative unless stated above.   PHYSICAL EXAMINATION:  VITAL SIGNS:  He is afebrile.  His pulse is 87,  respiratory rate is 16.  His blood pressure is 110/72, sats of 98% on  room air.  GENERAL:  He is a male, mildly obese, sitting flat in bed in no acute  distress.  HEENT: Normocephalic, atraumatic.  Pupils equal, round and reactive to  light.  Extraocular movements intact.  The oropharynx reveals no  posterior pharyngeal lesions.  NECK: Supple with no lymphadenopathy or thyromegaly.  No carotid bruits.  No jugular venous distention.  Lungs are clear to auscultation bilaterally.  CARDIOVASCULAR: There is regular rhythm and rate with no murmurs, rubs  or gallops.  Normal S1 and S2.  No S3 or S4.  ABDOMEN:  Obese, soft,  nontender, nondistended with no splenomegaly.  EXTREMITIES:  Show no clubbing, cyanosis.  He has trace lower extremity  edema.  NEUROLOGIC:  He is alert and oriented x4.  Cranial  nerves II-XII are  grossly intact.  Strength and sensation are grossly  intact.  PSYCHIATRIC:  Again, alert and oriented x4.  Mood and affect  appropriate.  SKIN:  Shows no rashes or ulcers.  MUSCULOSKELETAL:  Free range of motion with no evidence of synovitis.   His chest x-ray showed no acute cardiopulmonary disease.  Cardiomegaly  is noted.  Post sternotomy.  No major change compared to November, 2009  chest x-ray, although the technique is different.   EKG showed sinus tachycardia at a vent rate of 106 beats per minute, P-R  interval 152, QRS 82, QT corrected at 491.  No evidence of Q-waves or ST-  T changes.  No major change compared to EKG of November 2009.   LABORATORY DATA:  White count of 8.3 with hemoglobin of 12.4, hematocrit  35.5, platelets 217.  Sodium is 138, potassium 2.8, chloride 107, bicarb  21, BUN 9, creatinine 1.6, which is elevated compared to creatinine of  1.15 in November, 2009.  Glucose elevated at 157.  T bili 1.5, AST 24,  CO2 17, total bilirubin 1.5, total protein 8, albumin 4.3.  Sodium is  138.  He had normal differential on the CBC.  GFR 56.  His cardiac  markers:  Point-of-care at 1830 and at 1625 were unremarkable.  His  myoglobin was greater than 500.  The patient's UDS is positive for  cocaine and opiates.  Fasting lipid panel in October 2009 showed a  cholesterol of 226, triglycerides 178, HDL 32, LDL of 168.   ASSESSMENT/PLAN:  This is a 50 year old male with a history of coronary  artery disease, status post bypass surgery, single-vessel, history of  cocaine, who has had recent cocaine use and now with possible unstable  angina.  He has elevated creatinine concerning for renal failure,  profound hypokalemia with a long QT elevated glucose.  He has been off  aspirin and Plavix.  At this time, we will admit him to the CCU, rule  out PTE, aortic dissection with a D-dimer.  Will give him 300 of Plavix  now and then daily.  He will be on aspirin and  heparin drip,  nitroglycerin for his benzodiazepines as well for his chest pain, given  recent cocaine use.  1. For recent cocaine use, elevated creatinine, will get a urinalysis      and will aggressively hydrate.  Will make sure this is not a      rhabdomyolysis by checking CK and also blood RBCs and urinalysis.  2. For his elevated glucose, will check a hemoglobin A1c.  3. For his elevated creatinine, as above, will hold ACE inhibitors for      now.  4. He has had significant hyperlipidemia in the setting of coronary      artery disease.  Will increase his simvastatin for now.  Check      lipid panel in the morning.  5. For his suicidal ideations, he has no plan and will not need a      sitter for this.  May consider a psychiatry consult during this      hospitalization.  Will advise against the use of cocaine.  6. Hypokalemia.  Will replete.  Will follow closely, given his      elevated creatinine level.  7. Gastrointestinal and deep venous thrombosis prophylaxis will be      ordered.  8. For his possible acute coronary syndrome, may need a cardiac      ischemic evaluation.      Darryl D. Prime, MD  Electronically Signed  DDP/MEDQ  D:  10/23/2008  T:  10/23/2008  Job:  [id number]
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
## 2010-07-10 NOTE — Group Therapy Note (Signed)
NAME:  Dalton, [name] NO.:  [id number]   MEDICAL RECORD NO.:  [id number]          PATIENT TYPE:  INP   LOCATION:  IC04                          FACILITY:  [facility]   PHYSICIAN:  Margaretmary Dys, M.D.DATE OF BIRTH:  02/24/1960   DATE OF PROCEDURE:  01/26/2004  DATE OF DISCHARGE:                                   PROGRESS NOTE   SUBJECTIVE:  Dalton [name] is well known to me from his prior  admission. The patient has had a history of polysubstance abuse and during  his last admission actually tried to commit suicide. Attempts were made to  commit him, but this was not successful as patient left the hospital AMA.   The patient apparently was found down about midnight on January 25, 2004.   The patient has had multiple admissions over the last month.   Overnight, the patient was said to be very agitated and was trying to get  out of bed. It appeared that patient was withdrawing from some of his  substances.   He was sedated with Ativan, and the patient seems to be doing better. He is  very calm. He is not very responsive.   REVIEW OF SYSTEMS:  Not obtainable.   OBJECTIVE:  GENERAL:  The patient is sedated but appears comfortable. He is  easily arousable but does not follow commands.  VITAL SIGNS:  Blood pressure is 102/67, pulse 102, respirations 16,  temperature 100.6 degrees Fahrenheit, oxygen saturations are 100% on room  air.  HEENT:  Normocephalic, atraumatic. Oral mucosa is dry. No exudates were  noted.  NECK:  Supple. No JVD. No lymphadenopathy.  LUNGS:  Reduced air entry bilaterally. There were crackles, wheezing, or  rhonchi heard.  ABDOMEN:  Soft and nontender. Bowel sounds were positive.  HEART:  S1 and S2, regular. No S3, S4, gallops, or rubs.  CENTRAL NERVOUS SYSTEM:  The patient is sedated, poorly responsive, does not  follow commands. Pupils are equal, round, and reactive to light and  accommodation. The patient significantly arouses to  sternal rub.   LABORATORY/DIAGNOSTIC STUDIES:  White blood cell count 7.5, hemoglobin 12.6,  hematocrit 36.5, platelet count 217. No significant left shift. Sodium 137,  potassium 3.5, chloride 107, CO2 24, glucose 108, BUN 2, creatinine 1.1,  calcium 8.5.   ASSESSMENT/PLAN:  Dalton [name] is well known to me from previous  hospitalization with history of polysubstance abuse. The patient continues  to use drugs and was found unresponsive this latest admission. It was  unclear if patient had a seizure.   The patient overnight apparently awoke and became very combative and  agitated. He requested to be discharged. He received Ativan which seem to  calm him down. The patient is currently still sedated but arousable on deep  sternal pressure.   Plan will be to continue to watch him closely in intensive care unit. Will  continue IV fluids at 125 cc an hour with potassium replacement.   We will evaluate with psychiatric consult once patient is awake. Will  continue one on one for now.   The patient currently has a low grade  fever of 100.6. The reason for this is  unclear. Will continue to monitor him closely. Institute aspiration  precautions in him. If fever persists, will request a chest x-ray.   DISPOSITION:  The patient's mental status precludes any medical clearance at  this time.     Ayor   AM/MEDQ  D:  01/26/2004  T:  01/26/2004  Job:  [id number]

## 2010-07-10 NOTE — Op Note (Signed)
NAME:  Dalton [name], Dalton [name] NO.:  [id number]   MEDICAL RECORD NO.:  [id number]                   PATIENT TYPE:  OIB   LOCATION:  3009                                 FACILITY:  [facility]   PHYSICIAN:  Kathaleen Maser. Pool, M.D.                 DATE OF BIRTH:  [birth year]   DATE OF PROCEDURE:  DATE OF DISCHARGE:                                 OPERATIVE REPORT   PREOPERATIVE DIAGNOSIS:  Right L4-L5 herniated nucleus pulposus with  radiculopathy.   POSTOPERATIVE DIAGNOSIS:  Right L4-L5 herniated nucleus pulposus with  radiculopathy.   PROCEDURE NAME:  Right L4-L5 laminotomy with microdiskectomy.   SURGEON:  Kathaleen Maser. Pool, M.D.   ASSISTANT:  Reinaldo Meeker, M.D.   ANESTHESIA:  General endotracheal.   INDICATIONS:  The patient is a 50 year old black male with history of  bilateral lower extremity pain which has been longstanding and failing all  conservative measures, the patient is status post previous left-sided L5-S1  laminotomy and microdiskectomy with some relief of the symptoms.  The  patient presents now with worsening right lower extremity symptoms, most  consistent with a right side L5 radiculopathy.  MRI scanning demonstrates  significant stenosis at the L4-L5 level with a superimposed rightward L4-L5  disk herniation causing compression of the right-sided L5 nerve root.  We  discussed option as well as management including the possibility of  undergoing a right-sided L4-L5 laminotomy and microdiskectomy for help in  improving his symptoms.  The patient understands the risks and benefits and  wishes to proceed.   OPERATIVE NOTE:  The patient was brought to the operating room and placed on  the operating table in the supine position.  After an adequate level of  anesthesia was achieved, the patient was placed prone on the Wilson frame  and appropriately padded.  The patient's lumbar region was prepped and  draped sterilely.  A semicircular skin  incision overlying the L4-L5  interspace was made.  This was carried down sharply in the midline.  Subperiosteal dissection was performed exposing the laminae and facet joints  of L4 and L5 on the right.  Deep self-retaining retractor was placed.  X-  rays were taken.  The laminectomies were performed using the high-speed  drill and Kerrison rongeurs to remove the inferior one third of the lamina  at L4 and the medial edge of the L4-L5 facet joint and superior rim of the  L5 lamina.  The ligamentum flavum was then elevated and dissected in  piecemeal fashion using Kerrison rongeurs.  The thecal sac was identified.  The microscope was brought into the field and using microdissection of the  right-sided L5 nerve root, underlying epidural venous plexus was coagulated  and cut.  Thecal sac and L5 nerve root were mobilized in the midline.  The  disk space was readily apparent as was the overlying disk herniation.  This  was incised  with a #15 blade in a rectangular fashion.  A wide disk space  was then achieved using pituitary rongeurs, __________ rongeurs, and Epstein  curettes.  __________ away from the interspace.  All elements of the disk  herniation were completely recent.  At this point, a very thorough  diskectomy had been performed.  There were no elements of any residual  compression.  The wound was then irrigated with antibiotic solution.  Gelfoam was placed topically, and hemostasis was found to be good.  The  microscope and retractors were removed.  Hemostasis in the  muscle was achieved with electrocautery.  The wound was then closed in  layers with Vicryl sutures.  Steri-Strips and sterile dressing were applied.  There were no apparent complications.  The patient tolerated the procedure  well, and he returns to the recovery room.                                               Henry A. Pool, M.D.    HAP/MEDQ  D:  04/16/2002  T:  04/16/2002  Job:  [id number]

## 2010-09-23 ENCOUNTER — Emergency Department (HOSPITAL_COMMUNITY)
Admission: EM | Admit: 2010-09-23 | Discharge: 2010-09-23 | Disposition: A | Discharge status: Home / Self Care | Payer: Medicare Other | Attending: Emergency Medicine | Admitting: Emergency Medicine

## 2010-09-23 ENCOUNTER — Encounter: Payer: Self-pay | Admitting: *Deleted

## 2010-09-23 DIAGNOSIS — Z7982 Long term (current) use of aspirin: Secondary | ICD-10-CM | POA: Insufficient documentation

## 2010-09-23 DIAGNOSIS — I10 Essential (primary) hypertension: Secondary | ICD-10-CM | POA: Insufficient documentation

## 2010-09-23 DIAGNOSIS — G8929 Other chronic pain: Secondary | ICD-10-CM | POA: Insufficient documentation

## 2010-09-23 DIAGNOSIS — R300 Dysuria: Secondary | ICD-10-CM | POA: Insufficient documentation

## 2010-09-23 DIAGNOSIS — M5430 Sciatica, unspecified side: Secondary | ICD-10-CM

## 2010-09-23 DIAGNOSIS — E7800 Pure hypercholesterolemia, unspecified: Secondary | ICD-10-CM | POA: Insufficient documentation

## 2010-09-23 DIAGNOSIS — R350 Frequency of micturition: Secondary | ICD-10-CM | POA: Insufficient documentation

## 2010-09-23 HISTORY — DX: Essential (primary) hypertension: I10

## 2010-09-23 HISTORY — DX: Pure hypercholesterolemia, unspecified: E78.00

## 2010-09-23 LAB — URINALYSIS, ROUTINE W REFLEX MICROSCOPIC
Glucose, UA: NEGATIVE mg/dL
Hgb urine dipstick: NEGATIVE
Protein, ur: NEGATIVE mg/dL
pH: 6 (ref 5.0–8.0)

## 2010-09-23 MED ORDER — OXYCODONE-ACETAMINOPHEN 5-325 MG PO TABS
1.0000 | ORAL_TABLET | Freq: Once | ORAL | Status: AC
  Administered 2010-09-23: 1 via ORAL

## 2010-09-23 MED ORDER — OXYCODONE-ACETAMINOPHEN 5-325 MG PO TABS: 1.0000 | ORAL_TABLET | ORAL | Status: AC | PRN

## 2010-09-23 MED ORDER — PREDNISONE 20 MG PO TABS
60.0000 mg | ORAL_TABLET | Freq: Once | ORAL | Status: AC
  Administered 2010-09-23: 60 mg via ORAL

## 2010-09-23 MED ORDER — PREDNISONE 20 MG PO TABS: 60.0000 mg | ORAL_TABLET | Freq: Every day | ORAL | Status: AC

## 2010-09-23 NOTE — ED Notes (Signed)
Pt c/o prostate problems. Pt states he was given pain meds and antibiotics from PCP, but the pain is worse. Pt states he has also been doing warm soaks.

## 2010-09-23 NOTE — ED Notes (Signed)
Pt c/o groin pain (prostate problems) lower back, bilateral flank pain radiating down both legs.

## 2010-09-23 NOTE — ED Provider Notes (Signed)
History     Chief Complaint  Patient presents with  . Groin Pain  . Back Pain  . Flank Pain  . Leg Pain   Patient is a 50 y.o. male presenting with back pain. The history is provided by the patient.  Back Pain  This is a chronic (Has history of chronic low back pain, with multiple prior lumbar disk surgeries.  He is also being treated for a prostate infection.  Ran out of oxycodone 2 days ago,  now pain is worse.) problem. The current episode started yesterday. The problem occurs constantly. The problem has not changed since onset.The pain is associated with no known injury. The quality of the pain is described as aching. The pain radiates to the left thigh and right thigh. The pain is at a severity of 10/10. The pain is severe. The symptoms are aggravated by certain positions. The pain is the same all the time. Associated symptoms include dysuria. Pertinent negatives include no chest pain, no fever, no numbness, no headaches, no abdominal pain, no perianal numbness, no paresthesias, no tingling and no weakness. He has tried heat (narcotics,  antibiotics) for the symptoms. The treatment provided mild relief.    Past Medical History  Diagnosis Date  . Hypertension   . High cholesterol     History reviewed. No pertinent past surgical history.  Family History  Problem Relation Age of Onset  . Diabetes Mother   . Hypertension Mother   . Hypertension Father   . Diabetes Father     History  Substance Use Topics  . Smoking status: Never Smoker   . Smokeless tobacco: Not on file  . Alcohol Use: No      Review of Systems  Constitutional: Negative for fever.  HENT: Negative for congestion, sore throat and neck pain.   Eyes: Negative.   Respiratory: Negative for chest tightness and shortness of breath.   Cardiovascular: Negative for chest pain.  Gastrointestinal: Negative for nausea and abdominal pain.  Genitourinary: Positive for dysuria, frequency and decreased urine volume.    Musculoskeletal: Positive for back pain. Negative for joint swelling and arthralgias.  Skin: Negative.  Negative for rash and wound.  Neurological: Negative for dizziness, tingling, weakness, light-headedness, numbness, headaches and paresthesias.  Hematological: Negative.   Psychiatric/Behavioral: Negative.     Physical Exam  BP 125/85  Pulse 81  Temp 97.6 F (36.4 C)  Resp 20  Physical Exam  Constitutional: He appears well-developed and well-nourished. No distress.  HENT:  Head: Normocephalic and atraumatic.  Eyes: Conjunctivae are normal.  Neck: Normal range of motion. Neck supple.  Cardiovascular: Normal rate, regular rhythm, normal heart sounds and intact distal pulses.   Pulmonary/Chest: Effort normal and breath sounds normal. No respiratory distress. He has no wheezes.  Abdominal: Soft. He exhibits no distension. There is no tenderness. There is no rebound and no guarding.  Musculoskeletal:       Lumbar back: He exhibits tenderness and pain. He exhibits no swelling, no edema and no deformity.    ED Course  Procedures  MDM Chronic back pain and recent prostatitis actively being treated with abx.  Ran out of pain meds.  No neuro deficits on exam.  Medical screening examination/treatment/procedure(s) were performed by non-physician practitioner and as supervising physician I was immediately available for consultation/collaboration.     Candis Musa, PA 09/23/10 1307  Shelda Jakes, MD 10/09/10 [phone]

## 2010-10-04 ENCOUNTER — Emergency Department (HOSPITAL_COMMUNITY)
Admission: EM | Admit: 2010-10-04 | Discharge: 2010-10-04 | Disposition: A | Discharge status: Home / Self Care | Payer: Medicare Other | Attending: Emergency Medicine | Admitting: Emergency Medicine

## 2010-10-04 ENCOUNTER — Encounter (HOSPITAL_COMMUNITY): Payer: Self-pay

## 2010-10-04 DIAGNOSIS — I10 Essential (primary) hypertension: Secondary | ICD-10-CM | POA: Insufficient documentation

## 2010-10-04 DIAGNOSIS — G8929 Other chronic pain: Secondary | ICD-10-CM | POA: Insufficient documentation

## 2010-10-04 DIAGNOSIS — Z951 Presence of aortocoronary bypass graft: Secondary | ICD-10-CM | POA: Insufficient documentation

## 2010-10-04 DIAGNOSIS — E7800 Pure hypercholesterolemia, unspecified: Secondary | ICD-10-CM | POA: Insufficient documentation

## 2010-10-04 DIAGNOSIS — X500XXA Overexertion from strenuous movement or load, initial encounter: Secondary | ICD-10-CM | POA: Insufficient documentation

## 2010-10-04 DIAGNOSIS — M549 Dorsalgia, unspecified: Secondary | ICD-10-CM | POA: Insufficient documentation

## 2010-10-04 DIAGNOSIS — I509 Heart failure, unspecified: Secondary | ICD-10-CM | POA: Insufficient documentation

## 2010-10-04 HISTORY — DX: Heart failure, unspecified: I50.9

## 2010-10-04 MED ORDER — METHOCARBAMOL 500 MG PO TABS: ORAL_TABLET | ORAL | Status: DC

## 2010-10-04 MED ORDER — OXYCODONE-ACETAMINOPHEN 5-325 MG PO TABS: 1.0000 | ORAL_TABLET | Freq: Four times a day (QID) | ORAL | Status: AC | PRN

## 2010-10-04 NOTE — ED Provider Notes (Signed)
Medical screening examination/treatment/procedure(s) were performed by non-physician practitioner and as supervising physician I was immediately available for consultation/collaboration.  Glynn Octave, MD 10/04/10 [phone]

## 2010-10-04 NOTE — ED Notes (Signed)
Pt reports a hx of back problems.  Pt states that he was cleaning over the weekend and reports constant lower back and leg pain.  nad noted

## 2010-10-04 NOTE — ED Provider Notes (Signed)
History     CSN: [id number] Arrival date & time: 10/04/2010  8:03 AM  Chief Complaint  Patient presents with  . Back Pain   Patient is a 50 y.o. male presenting with back pain. The history is provided by the patient.  Back Pain  This is a chronic problem. The problem occurs daily. The problem has been gradually worsening. The pain is associated with lifting heavy objects. The pain is present in the lumbar spine. The quality of the pain is described as aching. The pain radiates to the left thigh. The pain is at a severity of 10/10. The pain is severe. The pain is the same all the time. Pertinent negatives include no chest pain, no abdominal pain and no dysuria. He has tried analgesics for the symptoms.    Past Medical History  Diagnosis Date  . Hypertension   . High cholesterol   . Congestive heart failure (CHF)     Past Surgical History  Procedure Date  . Coronary artery bypass graft     Family History  Problem Relation Age of Onset  . Diabetes Mother   . Hypertension Mother   . Hypertension Father   . Diabetes Father     History  Substance Use Topics  . Smoking status: Never Smoker   . Smokeless tobacco: Not on file  . Alcohol Use: No      Review of Systems  Constitutional: Negative for activity change.       All ROS Neg except as noted in HPI  HENT: Negative for nosebleeds and neck pain.   Eyes: Negative for photophobia and discharge.  Respiratory: Negative for cough, shortness of breath and wheezing.   Cardiovascular: Negative for chest pain and palpitations.  Gastrointestinal: Negative for abdominal pain and blood in stool.  Genitourinary: Negative for dysuria, frequency and hematuria.  Musculoskeletal: Positive for back pain. Negative for arthralgias.  Skin: Negative.   Neurological: Negative for dizziness, seizures and speech difficulty.  Psychiatric/Behavioral: Negative for hallucinations and confusion.    Physical Exam  BP 126/87  Pulse 90   Temp(Src) 98.4 F (36.9 C) (Oral)  Resp 19  Ht 5\' 10"  (1.778 m)  Wt 242 lb (109.77 kg)  BMI 34.72 kg/m2  SpO2 100%  Physical Exam  Nursing note and vitals reviewed. Constitutional: He is oriented to person, place, and time. He appears well-developed and well-nourished.  Non-toxic appearance.  HENT:  Head: Normocephalic.  Right Ear: Tympanic membrane and external ear normal.  Left Ear: Tympanic membrane and external ear normal.  Eyes: EOM and lids are normal. Pupils are equal, round, and reactive to light.  Neck: Normal range of motion. Neck supple. Carotid bruit is not present.  Cardiovascular: Normal rate, regular rhythm, normal heart sounds, intact distal pulses and normal pulses.   Pulmonary/Chest: Breath sounds normal. No respiratory distress.  Abdominal: Soft. Bowel sounds are normal. There is no tenderness. There is no guarding.  Musculoskeletal: He exhibits tenderness.       Lumbar back: He exhibits decreased range of motion, tenderness, pain and spasm.  Lymphadenopathy:       Head (right side): No submandibular adenopathy present.       Head (left side): No submandibular adenopathy present.    He has no cervical adenopathy.  Neurological: He is alert and oriented to person, place, and time. He has normal strength. No cranial nerve deficit or sensory deficit.  Skin: Skin is warm and dry.  Psychiatric: He has a normal mood and affect. His  speech is normal.    ED Course  Procedures  MDM I have reviewed nursing notes, vital signs, and all appropriate lab and imaging results for this patient.      Kathie Dike, [place] 10/04/10 [phone]

## 2010-10-28 ENCOUNTER — Ambulatory Visit (HOSPITAL_COMMUNITY): Payer: Medicare Other | Admitting: Physical Therapy

## 2010-11-05 ENCOUNTER — Ambulatory Visit (HOSPITAL_COMMUNITY): Payer: Medicare Other | Admitting: Physical Therapy

## 2010-11-05 DIAGNOSIS — M79609 Pain in unspecified limb: Secondary | ICD-10-CM | POA: Insufficient documentation

## 2010-11-05 DIAGNOSIS — M545 Low back pain, unspecified: Secondary | ICD-10-CM | POA: Insufficient documentation

## 2010-11-05 DIAGNOSIS — IMO0001 Reserved for inherently not codable concepts without codable children: Secondary | ICD-10-CM | POA: Insufficient documentation

## 2010-11-05 DIAGNOSIS — I10 Essential (primary) hypertension: Secondary | ICD-10-CM | POA: Insufficient documentation

## 2010-11-08 ENCOUNTER — Emergency Department (HOSPITAL_COMMUNITY): Payer: Medicare Other

## 2010-11-08 ENCOUNTER — Encounter (HOSPITAL_COMMUNITY): Payer: Self-pay | Admitting: *Deleted

## 2010-11-08 ENCOUNTER — Emergency Department (HOSPITAL_COMMUNITY)
Admission: EM | Admit: 2010-11-08 | Discharge: 2010-11-09 | Disposition: A | Discharge status: Home / Self Care | Payer: Medicare Other | Attending: Emergency Medicine | Admitting: Emergency Medicine

## 2010-11-08 DIAGNOSIS — S61509A Unspecified open wound of unspecified wrist, initial encounter: Secondary | ICD-10-CM | POA: Insufficient documentation

## 2010-11-08 DIAGNOSIS — Z79899 Other long term (current) drug therapy: Secondary | ICD-10-CM | POA: Insufficient documentation

## 2010-11-08 DIAGNOSIS — Z1881 Retained glass fragments: Secondary | ICD-10-CM | POA: Insufficient documentation

## 2010-11-08 DIAGNOSIS — Y92009 Unspecified place in unspecified non-institutional (private) residence as the place of occurrence of the external cause: Secondary | ICD-10-CM | POA: Insufficient documentation

## 2010-11-08 DIAGNOSIS — E7800 Pure hypercholesterolemia, unspecified: Secondary | ICD-10-CM | POA: Insufficient documentation

## 2010-11-08 DIAGNOSIS — W268XXA Contact with other sharp object(s), not elsewhere classified, initial encounter: Secondary | ICD-10-CM | POA: Insufficient documentation

## 2010-11-08 DIAGNOSIS — I10 Essential (primary) hypertension: Secondary | ICD-10-CM | POA: Insufficient documentation

## 2010-11-08 DIAGNOSIS — W01119A Fall on same level from slipping, tripping and stumbling with subsequent striking against unspecified sharp object, initial encounter: Secondary | ICD-10-CM | POA: Insufficient documentation

## 2010-11-08 DIAGNOSIS — T148XXA Other injury of unspecified body region, initial encounter: Secondary | ICD-10-CM

## 2010-11-08 MED ORDER — LIDOCAINE HCL 2 % IJ SOLN
20.0000 mL | Freq: Once | INTRAMUSCULAR | Status: DC
  Filled 2010-11-08: qty 20

## 2010-11-08 MED ORDER — OXYCODONE-ACETAMINOPHEN 5-325 MG PO TABS
2.0000 | ORAL_TABLET | Freq: Once | ORAL | Status: AC
  Administered 2010-11-08: 2 via ORAL
  Filled 2010-11-08: qty 2

## 2010-11-08 MED ORDER — BACITRACIN ZINC 500 UNIT/GM EX OINT
TOPICAL_OINTMENT | CUTANEOUS | Status: AC
  Filled 2010-11-08: qty 3.6

## 2010-11-08 MED ORDER — CEPHALEXIN 500 MG PO CAPS
500.0000 mg | ORAL_CAPSULE | Freq: Once | ORAL | Status: AC
  Administered 2010-11-08: 500 mg via ORAL
  Filled 2010-11-08: qty 1

## 2010-11-08 NOTE — ED Notes (Signed)
Patient states that he fell through a window on Thursday, patient with wound to right hand/wrist

## 2010-11-08 NOTE — ED Provider Notes (Addendum)
Scribed for Dayton Bailiff, MD, the patient was seen in room APA17/APA17 . This chart was scribed by Ellie Lunch. This patient's care was started at 9:55 PM.   CSN: [id number] Arrival date & time: 11/08/2010  8:46 PM   Chief Complaint  Patient presents with  . Wound Check     (Include location/radiation/quality/duration/timing/severity/associated sxs/prior treatment) HPI Dalton [name] is a 50 y.o. male who presents to the Emergency Department complaining of laceration to his right wrist. Pt reports pain at wound location has become progressively worse.   Pt reports he was fixing a window and fell through the window injuring his right wrist three days ago. Pt has been treating wound with peroxide and Vaseline.  Last tetanus 09. HPI ELEMENTS:  Location: right wrist Onset: 3 days ago Timing: constant Quality: throbbing    Context: as above  Associated symptoms: pain   Past Medical History  Diagnosis Date  . Hypertension   . High cholesterol   . Congestive heart failure (CHF)     Past Surgical History  Procedure Date  . Coronary artery bypass graft   . Back surgery     Family History  Problem Relation Age of Onset  . Diabetes Mother   . Hypertension Mother   . Hypertension Father   . Diabetes Father     History  Substance Use Topics  . Smoking status: Never Smoker   . Smokeless tobacco: Not on file  . Alcohol Use: No    Review of Systems 10 Systems reviewed and are negative for acute change except as noted in the HPI.   Allergies  Gabapentin; Ibuprofen; Naproxen; and Zolpidem tartrate  Home Medications   Current Outpatient Rx  Name Route Sig Dispense Refill  . ALBUTEROL SULFATE HFA 108 (90 BASE) MCG/ACT IN AERS Inhalation Inhale 2 puffs into the lungs every 6 (six) hours as needed. Shortness of breath     . ASPIRIN 325 MG PO TBEC Oral Take 325 mg by mouth daily.      . ATORVASTATIN CALCIUM 40 MG PO TABS Oral Take 40 mg by mouth daily.      [name] Kitchen CLOPIDOGREL  BISULFATE 75 MG PO TABS Oral Take 75 mg by mouth daily.      . FUROSEMIDE 40 MG PO TABS Oral Take 40 mg by mouth daily.      [name] Kitchen METHOCARBAMOL 500 MG PO TABS  2 po tid for spam 30 tablet 0  . METOPROLOL TARTRATE 25 MG PO TABS Oral Take 25 mg by mouth 2 (two) times daily.      . OFLOXACIN 400 MG PO TABS Oral Take 400 mg by mouth 2 (two) times daily.      [name] Kitchen OMEPRAZOLE 20 MG PO CPDR Oral Take 20 mg by mouth daily.        Physical Exam    BP 105/66  Pulse 80  Temp(Src) 98.6 F (37 C) (Oral)  Resp 16  Ht 5\' 10"  (1.778 m)  Wt 252 lb (114.306 kg)  BMI 36.16 kg/m2  SpO2 99%  Physical Exam  Nursing note and vitals reviewed. Constitutional: He is oriented to person, place, and time. He appears well-developed and well-nourished.  HENT:  Head: Normocephalic and atraumatic.  Eyes: Conjunctivae and EOM are normal. Pupils are equal, round, and reactive to light.  Neck: Normal range of motion. Neck supple.  Cardiovascular: Normal rate, regular rhythm and normal heart sounds.   Pulmonary/Chest: Effort normal and breath sounds normal.  Abdominal: Soft. There is no  tenderness.  Musculoskeletal: Normal range of motion.  Neurological: He is alert and oriented to person, place, and time.  Skin: Skin is warm and dry.       Large avulsion of right dorsal proximal thumb and distal wrist. Flap of skin stuck down to central portion of the wound not able to mobilize. Small punctate lesion to right middle finger. Full ROM all digits right hand . No sign of infections.   Psychiatric: He has a normal mood and affect.   LACERATION REPAIR Date/Time: 11/09/2010 12:11 AM Performed by: Dayton Bailiff Authorized by: Dayton Bailiff Consent: Verbal consent obtained. Risks and benefits: risks, benefits and alternatives were discussed Consent given by: patient Patient understanding: patient states understanding of the procedure being performed Patient identity confirmed: verbally with patient Time out: Immediately  prior to procedure a "time out" was called to verify the correct patient, procedure, equipment, support staff and site/side marked as required. Body area: upper extremity Location details: right hand Contamination: The wound is contaminated. Foreign bodies: glass Tendon involvement: none Nerve involvement: none Vascular damage: no Anesthesia: local infiltration Local anesthetic: lidocaine 2% without epinephrine Anesthetic total: 8 ml Patient sedated: no Preparation: Patient was prepped and draped in the usual sterile fashion. Irrigation solution: saline Irrigation method: syringe Amount of cleaning: extensive Debridement: none Skin closure: 4-0 Prolene Number of sutures: 3 Technique: simple Approximation: close Approximation difficulty: simple Dressing: 4x4 sterile gauze, antibiotic ointment and non-adhesive packing strip Patient tolerance: Patient tolerated the procedure well with no immediate complications. Comments: This was a skin avulsion rather than a laceration. A skin flap was stretched out and 3 sutures were placed to tack it down. There was still a significant amount of the wound left exposed.    OTHER DATA REVIEWED: Nursing notes, vital signs, and past medical records reviewed.  DIAGNOSTIC STUDIES: Oxygen Saturation is 99% on room air, normal by my interpretation.    LABS / RADIOLOGY: Imaging showed a small linear FB   ED COURSE / COORDINATION OF CARE: 21:55 Discussed with Pt plan to treat with abx. Not able suture at this point. Discussed need for f/u with PCP. No stitches in wound after 24 hours because of high risk of infection.   22:02 EDP at pt bedside to clean wound. Xray hand for glass. Treat with abx. EDP ordered Orders Placed This Encounter  Procedures  . DG Hand Complete Right   Medications  bacitracin 500 UNIT/GM ointment   lidocaine (XYLOCAINE) 2 % (with pres) injection 400 mg   oxyCODONE-acetaminophen (PERCOCET) 10-325 MG per tablet   cephALEXin  (KEFLEX) capsule 500 mg (500 mg Oral Given 11/08/10 2215)  oxyCODONE-acetaminophen (PERCOCET) 5-325 MG per tablet 2 tablet (2 tablet Oral Given 11/08/10 2216)   20:08 Consult with Dr. Patria Mane for wound treatment options.   23:27 EDP at PT bedside to repair and dress wound.  Skin avulsion  MDM: The patient is 2 days status post injury. There is high risk for infection at this time. There is not much remaining skin which can be utilized to cover the wound. I did anesthetize the area and stretch out the only skin flap available which covered approximately 1/5 of the wound. A sterile dressing was applied and the patient will be sent home with some extra supplies. His tetanus shot was up-to-date. I explained to the patient that this wound has a high risk for infection and local wound care is extremely important. I explained that he will require followup frequently to ensure wound healing. I  will place him on antibiotics to cover for the possibility of infection. He received his first dose numerous department will be discharged home with clindamycin. Patient expresses understanding. I instructed him to call his primary care physician to establish followup this week. He is provided signs and symptoms for which to return to the emergency department. The linear foreign body identified on the imaging was identified and removed with irrigation during the procedure. I also provided a referral for wound care  SCRIBE ATTESTATION: I personally performed the services described in this documentation, which was scribed in my presence. The recorded information has been reviewed and considered.   The laceration was a large avulsion injury with part of the skin flap missing and part annealed to surface.  The wound in its entirety was approx 10x4 cm      Dayton Bailiff, MD 11/09/10 1610  Dayton Bailiff, MD 11/09/10 [id number]  Dayton Bailiff, MD 04/07/11 1758  Dayton Bailiff, MD 04/07/11 [phone]

## 2010-11-09 MED ORDER — CLINDAMYCIN HCL 150 MG PO CAPS: 300.0000 mg | ORAL_CAPSULE | Freq: Four times a day (QID) | ORAL | Status: AC

## 2010-11-09 MED ORDER — OXYCODONE-ACETAMINOPHEN 5-325 MG PO TABS: 2.0000 | ORAL_TABLET | ORAL | Status: AC | PRN

## 2010-11-17 ENCOUNTER — Ambulatory Visit (HOSPITAL_COMMUNITY)
Admission: RE | Admit: 2010-11-17 | Discharge: 2010-11-17 | Disposition: A | Discharge status: Home / Self Care | Payer: Medicare Other | Source: Ambulatory Visit | Attending: Anesthesiology | Admitting: Anesthesiology

## 2010-11-17 DIAGNOSIS — M545 Low back pain, unspecified: Secondary | ICD-10-CM | POA: Insufficient documentation

## 2010-11-17 NOTE — Patient Instructions (Signed)
HEp 
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
## 2010-11-20 ENCOUNTER — Ambulatory Visit (HOSPITAL_COMMUNITY)
Admission: RE | Admit: 2010-11-20 | Discharge: 2010-11-20 | Disposition: A | Discharge status: Home / Self Care | Payer: Medicare Other | Source: Ambulatory Visit | Attending: Physical Therapy | Admitting: Physical Therapy

## 2010-11-20 DIAGNOSIS — M79605 Pain in left leg: Secondary | ICD-10-CM

## 2010-11-20 LAB — DIFFERENTIAL
Basophils Absolute: 0
Basophils Absolute: 0
Basophils Relative: 0
Eosinophils Absolute: 0.1
Eosinophils Absolute: 0
Eosinophils Relative: 1
Lymphocytes Relative: 13
Monocytes Absolute: 0.4
Neutro Abs: 4.3
Neutrophils Relative %: 63

## 2010-11-20 LAB — BASIC METABOLIC PANEL
BUN: 11
CO2: 31
CO2: 30
CO2: 34 — ABNORMAL HIGH
Chloride: 104
Chloride: 97
Chloride: 99
Creatinine, Ser: 0.91
Creatinine, Ser: 0.88
GFR calc Af Amer: >60
GFR calc Af Amer: >60
GFR calc non Af Amer: >60
Glucose, Bld: 114 — ABNORMAL HIGH
Potassium: 4
Potassium: 4.4
Sodium: 137

## 2010-11-20 LAB — CBC
HCT: 42.4
HCT: 40.7
Hemoglobin: 14.4
Hemoglobin: 13.8
MCV: 88
Platelets: 224
RBC: 4.79
RDW: 14.3
WBC: 6.8

## 2010-11-20 LAB — COMPREHENSIVE METABOLIC PANEL
ALT: 15
Alkaline Phosphatase: 74
BUN: 9
CO2: 31
Chloride: 99
Glucose, Bld: 104 — ABNORMAL HIGH
Potassium: 4.5
Total Bilirubin: 0.8

## 2010-11-20 LAB — CARDIAC PANEL(CRET KIN+CKTOT+MB+TROPI)
CK, MB: 0.8
CK, MB: 1
Relative Index: 0.7
Relative Index: 0.7
Relative Index: 0.7
Total CK: 122
Troponin I: 0.01

## 2010-11-20 LAB — RAPID URINE DRUG SCREEN, HOSP PERFORMED
Amphetamines: NOT DETECTED
Barbiturates: NOT DETECTED
Benzodiazepines: POSITIVE — AB
Cocaine: POSITIVE — AB
Opiates: NOT DETECTED

## 2010-11-20 LAB — PROTIME-INR
INR: 1
Prothrombin Time: 13.4

## 2010-11-20 LAB — HEPATIC FUNCTION PANEL
ALT: 21
Albumin: 3.7
Alkaline Phosphatase: 106
Total Bilirubin: 0.5
Total Protein: 7.4

## 2010-11-20 LAB — LIPID PANEL
Cholesterol: 306 — ABNORMAL HIGH
LDL Cholesterol: UNDETERMINED
Triglycerides: 411 — ABNORMAL HIGH

## 2010-11-20 LAB — POCT CARDIAC MARKERS
CKMB, poc: 1.5
CKMB, poc: 1.9
Operator id: 277751
Operator id: 277751
Troponin i, poc: 0.18 — ABNORMAL HIGH
Troponin i, poc: <0.05
Troponin i, poc: <0.05

## 2010-11-20 LAB — ETHANOL: Alcohol, Ethyl (B): <5

## 2010-11-20 LAB — URINALYSIS, ROUTINE W REFLEX MICROSCOPIC
Bilirubin Urine: NEGATIVE
Glucose, UA: NEGATIVE
Hgb urine dipstick: NEGATIVE
Ketones, ur: NEGATIVE
Protein, ur: NEGATIVE
pH: 6

## 2010-11-20 LAB — PHENYTOIN LEVEL, TOTAL: Phenytoin Lvl: 4.8 — ABNORMAL LOW

## 2010-11-20 NOTE — Patient Instructions (Signed)
HEp 

## 2010-11-20 NOTE — Progress Notes (Signed)
Physical Therapy Treatment Patient Details  Name: CATO LIBURD MRN: [id number] Date of Birth: 08/01/1960  Today's Date: 11/20/2010 Time: 1191-4782 Time Calculation (min): 42 min Visit#: 2  of 8   Re-eval: 12/17/10  charges:  There ex 42  Subjective: Symptoms/Limitations Symptoms: I tried to do a coulple of exercises. Pain Assessment Currently in Pain?: Yes Pain Score:   9 Pain Location: Back Pain Orientation: Right;Left Pain Type: Chronic pain   Exercise/Treatments Stretches Active Hamstring Stretch: 3 reps;30 seconds Single Knee to Chest Stretch: 3 reps;30 seconds Lumbar Exercises   Stability: Supine: Clam: 15 reps Bridge: 15 reps Bent Knee Raise: 10 reps Isometric Hip Flexion: 10 reps Toe Tap: 10 reps Straight Leg Raise: 10 reps;Supine Prone: Heel Squeeze: 15 reps Single leg lift B x10 reps Machine Exercises Tread Mill: 1.39mph x 5'     Physical Therapy Assessment and Plan PT Assessment and Plan Clinical Impression Statement: decreased stability; pt did well with new exercises. Rehab Potential: Good Clinical Impairments Affecting Rehab Potential: weak core mm PT Plan: Pt to begin SL abduction, Prone SAR/opposite arm/leg raise next treatment.    Goals    Problem List Patient Active Problem List  Diagnoses  . CARPAL TUNNEL SYNDROME  . SHOULDER IMPINGEMENT SYNDROME, LEFT  . Lumbar pain with radiation down left leg    PT - End of Session Activity Tolerance: Patient tolerated treatment well General Behavior During Session: [facility] for tasks performed  RUSSELL,CINDY 11/20/2010, 4:01 PM

## 2010-11-23 LAB — B-NATRIURETIC PEPTIDE (CONVERTED LAB): Pro B Natriuretic peptide (BNP): <30

## 2010-11-23 LAB — BASIC METABOLIC PANEL
BUN: 11
BUN: 16
CO2: 30
Chloride: 107
Creatinine, Ser: 1.17
Creatinine, Ser: 1.62 — ABNORMAL HIGH
GFR calc non Af Amer: 46 — ABNORMAL LOW
Potassium: 4.3

## 2010-11-23 LAB — RAPID URINE DRUG SCREEN, HOSP PERFORMED
Amphetamines: NOT DETECTED
Opiates: NOT DETECTED
Tetrahydrocannabinol: NOT DETECTED

## 2010-11-23 LAB — CBC
HCT: 32.3 — ABNORMAL LOW
HCT: 34.6 — ABNORMAL LOW
MCV: 89.3
Platelets: 209
RBC: 3.62 — ABNORMAL LOW
WBC: 3.7 — ABNORMAL LOW
WBC: 8

## 2010-11-23 LAB — DIFFERENTIAL
Basophils Relative: 0
Eosinophils Relative: 6 — ABNORMAL HIGH
Lymphocytes Relative: 14
Lymphs Abs: 1.1
Monocytes Absolute: 0.4
Monocytes Relative: 10
Neutro Abs: 6.3
Neutrophils Relative %: 53
Neutrophils Relative %: 80 — ABNORMAL HIGH

## 2010-11-23 LAB — PHENYTOIN LEVEL, TOTAL: Phenytoin Lvl: <2.5 — ABNORMAL LOW

## 2010-11-23 LAB — POCT CARDIAC MARKERS
CKMB, poc: 36.1
Myoglobin, poc: >500
Troponin i, poc: <0.05

## 2010-11-23 LAB — LIPID PANEL
Cholesterol: 226 — ABNORMAL HIGH
LDL Cholesterol: 158 — ABNORMAL HIGH
Total CHOL/HDL Ratio: 7.1

## 2010-11-23 LAB — CARDIAC PANEL(CRET KIN+CKTOT+MB+TROPI)
Relative Index: 0.6
Troponin I: 0.02

## 2010-11-23 LAB — PROTIME-INR: INR: 1.1

## 2010-11-24 ENCOUNTER — Ambulatory Visit (HOSPITAL_COMMUNITY): Payer: Medicare Other | Admitting: *Deleted

## 2010-11-24 ENCOUNTER — Telehealth (HOSPITAL_COMMUNITY): Payer: Self-pay

## 2010-11-24 LAB — BASIC METABOLIC PANEL
Calcium: 8.8
GFR calc Af Amer: >60
GFR calc non Af Amer: >60
Glucose, Bld: 91
Potassium: 3.2 — ABNORMAL LOW
Sodium: 140

## 2010-11-24 LAB — CBC
HCT: 34.4 — ABNORMAL LOW
Hemoglobin: 11.7 — ABNORMAL LOW
MCHC: 34
MCV: 89.1
RBC: 3.87 — ABNORMAL LOW
RDW: 14.9

## 2010-11-24 LAB — URINALYSIS, ROUTINE W REFLEX MICROSCOPIC
Ketones, ur: 15 — AB
Nitrite: NEGATIVE
Specific Gravity, Urine: 1.017
Urobilinogen, UA: 0.2
pH: 5.5

## 2010-11-24 LAB — COMPREHENSIVE METABOLIC PANEL
BUN: 13
CO2: 27
Calcium: 9.9
Creatinine, Ser: 1.5
GFR calc non Af Amer: 50 — ABNORMAL LOW
Glucose, Bld: 110 — ABNORMAL HIGH
Sodium: 141
Total Protein: 8.4 — ABNORMAL HIGH

## 2010-11-24 LAB — DIFFERENTIAL
Eosinophils Absolute: 0
Lymphocytes Relative: 15
Lymphs Abs: 1.5
Monocytes Relative: 4
Neutro Abs: 8.1 — ABNORMAL HIGH
Neutrophils Relative %: 81 — ABNORMAL HIGH

## 2010-11-24 LAB — RAPID URINE DRUG SCREEN, HOSP PERFORMED
Amphetamines: NOT DETECTED
Tetrahydrocannabinol: NOT DETECTED

## 2010-11-24 LAB — POCT CARDIAC MARKERS
CKMB, poc: 12.6
Troponin i, poc: <0.05

## 2010-11-24 LAB — CK TOTAL AND CKMB (NOT AT ARMC): Relative Index: 0.3

## 2010-11-24 LAB — CARDIAC PANEL(CRET KIN+CKTOT+MB+TROPI)
CK, MB: 5.5 — ABNORMAL HIGH
CK, MB: 7.3 — ABNORMAL HIGH
Relative Index: 0.2
Total CK: 3124 — ABNORMAL HIGH

## 2010-11-25 LAB — ETHANOL: Alcohol, Ethyl (B): <5

## 2010-11-25 LAB — BASIC METABOLIC PANEL
BUN: 4 — ABNORMAL LOW
Calcium: 9
Creatinine, Ser: 1.02
GFR calc non Af Amer: >60
Glucose, Bld: 93
Potassium: 3.3 — ABNORMAL LOW

## 2010-11-25 LAB — HEPATIC FUNCTION PANEL
Albumin: 3.1 — ABNORMAL LOW
Alkaline Phosphatase: 69
Indirect Bilirubin: 0.5
Total Protein: 6.9

## 2010-11-25 LAB — TSH: TSH: 2.37

## 2010-11-25 LAB — URINALYSIS, ROUTINE W REFLEX MICROSCOPIC
Glucose, UA: NEGATIVE
Ketones, ur: NEGATIVE
Nitrite: NEGATIVE
Protein, ur: NEGATIVE
pH: 6

## 2010-11-25 LAB — CBC
Hemoglobin: 11.7 — ABNORMAL LOW
MCHC: 34
Platelets: 223
RDW: 15.5

## 2010-11-25 LAB — RAPID URINE DRUG SCREEN, HOSP PERFORMED
Amphetamines: NOT DETECTED
Benzodiazepines: NOT DETECTED
Cocaine: POSITIVE — AB
Opiates: NOT DETECTED
Tetrahydrocannabinol: NOT DETECTED

## 2010-11-25 LAB — PHENYTOIN LEVEL, TOTAL: Phenytoin Lvl: <2.5 — ABNORMAL LOW

## 2010-11-26 ENCOUNTER — Inpatient Hospital Stay (HOSPITAL_COMMUNITY): Admission: RE | Admit: 2010-11-26 | Payer: Medicare Other | Source: Ambulatory Visit | Admitting: Physical Therapy

## 2010-11-27 DIAGNOSIS — S61411A Laceration without foreign body of right hand, initial encounter: Secondary | ICD-10-CM

## 2010-11-27 HISTORY — DX: Laceration without foreign body of right hand, initial encounter: S61.411A

## 2010-12-01 ENCOUNTER — Ambulatory Visit (HOSPITAL_COMMUNITY): Payer: Medicare Other | Admitting: Physical Therapy

## 2010-12-01 DIAGNOSIS — I10 Essential (primary) hypertension: Secondary | ICD-10-CM | POA: Insufficient documentation

## 2010-12-01 DIAGNOSIS — M545 Low back pain, unspecified: Secondary | ICD-10-CM | POA: Insufficient documentation

## 2010-12-01 DIAGNOSIS — IMO0001 Reserved for inherently not codable concepts without codable children: Secondary | ICD-10-CM | POA: Insufficient documentation

## 2010-12-01 DIAGNOSIS — M79609 Pain in unspecified limb: Secondary | ICD-10-CM | POA: Insufficient documentation

## 2010-12-03 ENCOUNTER — Ambulatory Visit (HOSPITAL_COMMUNITY)
Admission: RE | Admit: 2010-12-03 | Discharge: 2010-12-03 | Disposition: A | Discharge status: Home / Self Care | Payer: Medicare Other | Source: Ambulatory Visit | Attending: Anesthesiology | Admitting: Anesthesiology

## 2010-12-03 NOTE — Progress Notes (Signed)
Physical Therapy Treatment Patient Details  Name: Dalton [name] MRN: [id number] Date of Birth: [birth year]  Today's Date: 12/03/2010 Time: 8119-1478 Time Calculation (min): 40 min Visit#: 3  of 8   Re-eval: 12/17/10 Charges:  therex 36'  Subjective: Symptoms/Limitations Symptoms: Pt.  has not been at therapy (since 11/20/10) due to an infection in his R hand; pt cut his hand on glass and is now infected; informed pt that we offer woundcare here at our facility.  Pt. reports he has 10/10 back pain but denies need to visit ED. Pain Assessment Pain Score: 10-Worst pain ever Pain Location: Back Pain Orientation: Lower;Mid Pain Radiating Towards: L foot   Exercise/Treatments Stretches Active Hamstring Stretch:  (time) Single Knee to Chest Stretch:  (time) Stability Clam: 15 reps Bridge: 15 reps Bent Knee Raise: 10 reps Isometric Hip Flexion: 10 reps;5 seconds Straight Leg Raise: 10 reps Hip Abduction: Side-lying;10 reps;Other (comment) (bilateral) Heel Squeeze: Prone;15 reps;5 seconds Single Arm Raise: Prone;5 reps Leg Raise: Prone;10 reps Opposite Arm/Leg Raise: Prone;5 seconds Machine Exercises Tread Mill: 1.4mph x 5'     Physical Therapy Assessment and Plan PT Assessment and Plan Clinical Impression Statement: Pt. able to complete all therex without complaints.  Required VC's to stab and correct form with therex.  Added prone therex without difficulty.   PT Treatment/Interventions: Therapeutic exercise PT Plan: Continue to progress reps to increase stabilization of back.  Pt to inquire regarding woundcare.     Problem List Patient Active Problem List  Diagnoses  . CARPAL TUNNEL SYNDROME  . SHOULDER IMPINGEMENT SYNDROME, LEFT  . Lumbar pain with radiation down left leg    PT - End of Session Activity Tolerance: Patient tolerated treatment well General Behavior During Session: [facility] for tasks performed Cognition: [facility] for tasks performed  Dalton [name]  B 12/03/2010, 3:10 PM

## 2010-12-07 ENCOUNTER — Encounter: Payer: Self-pay | Admitting: Gastroenterology

## 2010-12-07 ENCOUNTER — Ambulatory Visit (INDEPENDENT_AMBULATORY_CARE_PROVIDER_SITE_OTHER): Payer: Medicare Other | Admitting: Gastroenterology

## 2010-12-07 VITALS — BP 127/78 | HR 70 | Temp 97.4°F | Ht 70.0 in | Wt 250.2 lb

## 2010-12-07 DIAGNOSIS — R1310 Dysphagia, unspecified: Secondary | ICD-10-CM

## 2010-12-07 DIAGNOSIS — K219 Gastro-esophageal reflux disease without esophagitis: Secondary | ICD-10-CM | POA: Insufficient documentation

## 2010-12-07 DIAGNOSIS — J387 Other diseases of larynx: Secondary | ICD-10-CM

## 2010-12-07 DIAGNOSIS — K5900 Constipation, unspecified: Secondary | ICD-10-CM

## 2010-12-07 DIAGNOSIS — D649 Anemia, unspecified: Secondary | ICD-10-CM | POA: Insufficient documentation

## 2010-12-07 DIAGNOSIS — R1314 Dysphagia, pharyngoesophageal phase: Secondary | ICD-10-CM

## 2010-12-07 NOTE — Assessment & Plan Note (Signed)
No prior TCS. 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 TCS in near future with deep sedation in OR due to chronic narcotics.  I have discussed the risks, alternatives, benefits with regards to but not limited to the risk of reaction to medication, bleeding, infection, perforation and the patient is agreeable to proceed. Written consent to be obtained.

## 2010-12-07 NOTE — Assessment & Plan Note (Signed)
UGI symptoms consist of typical heartburn well-controlled on omeprazole but chronic dry cough, hoarsenss/soreness, dysphagia to solids. ?esophageal web/ring/stricture/esophagitis. ?LPR. EGD+/-ED in near future. Procedure to be done with deep sedation in OR given h/o chronic narcotic use. Patient denies ongoing cocaine use. Questioned multiple times, adamantly denies.

## 2010-12-07 NOTE — Patient Instructions (Signed)
We have scheduled you for an upper endoscopy and colonoscopy. See separate instructions. Please have your blood work done.  Diet for GERD or PUD Nutrition therapy can help ease the discomfort of gastroesophageal reflux disease (GERD) and peptic ulcer disease (PUD).  HOME CARE INSTRUCTIONS  Eat your meals slowly, in a relaxed setting.   Eat 5 to 6 small meals per day.   If a food causes distress, stop eating it for a period of time.  FOODS TO AVOID:  Coffee, regular or decaffeinated.  Cola beverages, regular or low calorie.   Tea, regular or decaffeinated.   Pepper.   Cocoa.   High fat foods including meats.   Butter, margarine, hydrogenated oil (trans fats).  Peppermint or spearmint (if you have GERD).   Fruits and vegetables as tolerated.   Alcoholic beverages.   Nicotine (smoking or chewing). This is one of the most potent stimulants to acid production in the gastrointestinal tract.   Any food that seems to aggravate your condition.   If you have questions regarding your diet, call your caregiver's office or a registered dietitian. OTHER TIPS IF YOU HAVE GERD:  Lying flat may make symptoms worse. Keep the head of your bed raised 6 to 9 inches by using a foam wedge or blocks under the legs of the bed.   Do not lay down until 3 hours after eating a meal.   Daily physical activity may help reduce symptoms.  MAKE SURE YOU:   Understand these instructions.   Will watch your condition.   Will get help right away if you are not doing well or get worse.  Document Released: 02/08/2005 Document Re-Released: 06/27/2008 [facility] Patient Information 2011 [place], [place].

## 2010-12-07 NOTE — Assessment & Plan Note (Signed)
Recheck CBC. 

## 2010-12-07 NOTE — Progress Notes (Signed)
Primary Care Physician:  Reynolds Bowl, MD  Primary Gastroenterologist:  Jonette Eva, MD   Chief Complaint  Patient presents with  . Gastrophageal Reflux    HPI:  Dalton [name] is a 50 y.o. male here for further evaluation of gerd symptoms/?LPR. C/O dry cough, throat irritation. Wakes up in middle of night with it. Going on for several months. Happening during night and day. Try to drink hot liquid or cold liquid may help at times. Throat clearing with meals. No heartburn on omeprazole. Never tried PPI BID. No abdominal pain. Epigastric region feels real tight, happens with and without meal. No n/v. BM, some intermittent constipation. Sometimes has to use mag citrate, takes 3-4 times per month. Symptoms worse with pain medications. Used to use Miralax but not as effective as mag citrate. Appetite poor. No significant weight loss. No melena, brbpr. Difficulty swallowing solid foods.   Mild anemia during hospitalization earlier this year. Recent laceration of his hand.   Current Outpatient Prescriptions  Medication Sig Dispense Refill  . albuterol (PROVENTIL HFA;VENTOLIN HFA) 108 (90 BASE) MCG/ACT inhaler Inhale 2 puffs into the lungs every 6 (six) hours as needed. Shortness of breath       . aspirin 325 MG EC tablet Take 325 mg by mouth daily.        [name] Kitchen atorvastatin (LIPITOR) 40 MG tablet Take 40 mg by mouth daily.        . cetirizine (ZYRTEC) 10 MG tablet Take 10 mg by mouth daily.        . clindamycin (CLEOCIN) 150 MG capsule Take 150 mg by mouth 3 (three) times daily.        . clopidogrel (PLAVIX) 75 MG tablet Take 75 mg by mouth daily.        . furosemide (LASIX) 40 MG tablet Take 40 mg by mouth daily.        [name] Kitchen lisinopril (PRINIVIL,ZESTRIL) 10 MG tablet Take 10 mg by mouth daily.        . metoprolol tartrate (LOPRESSOR) 25 MG tablet Take 25 mg by mouth 2 (two) times daily.        . mometasone (NASONEX) 50 MCG/ACT nasal spray Place 2 sprays into the nose daily.        [name] Kitchen omeprazole  (PRILOSEC) 20 MG capsule Take 20 mg by mouth daily.        [name] Kitchen oxyCODONE-acetaminophen (PERCOCET) 10-325 MG per tablet Take 1 tablet by mouth every 4 (four) hours as needed. pain       . Tamsulosin HCl (FLOMAX) 0.4 MG CAPS Take by mouth.        [name] Kitchen UNKNOWN TO PATIENT Medication for seizures.       . methocarbamol (ROBAXIN) 500 MG tablet 2 po tid for spam  30 tablet  0  . ofloxacin (FLOXIN) 400 MG tablet Take 400 mg by mouth 2 (two) times daily.          Allergies as of 12/07/2010 - Review Complete 12/07/2010  Allergen Reaction Noted  . Gabapentin Hives   . Ibuprofen Other (See Comments)   . Zolpidem tartrate    . Naproxen Rash     Past Medical History  Diagnosis Date  . Hypertension   . High cholesterol   . Congestive heart failure (CHF)   . Arthritis   . MI (myocardial infarction)     8, last one was in 2011  . Seizures     entire life, last seizure in 2011  . PUD (peptic ulcer  disease)     in 1990s, secondary to medication  . Laceration of right hand 11/27/10  . Chronic back pain     Pain Clinic in [place]  . Chronic bronchitis     Past Surgical History  Procedure Date  . Coronary artery bypass graft 2002  . Back surgery     3  . Carpal tunnel release     bilateral  . Knee surgery     left    Family History  Problem Relation Age of Onset  . Diabetes Mother   . Hypertension Mother   . Hypertension Father   . Diabetes Father   . Heart attack      mother, father, brother, sister all deceased due to MI  . Colon cancer Neg Hx   . Liver disease Neg Hx     History   Social History  . Marital Status: Single    Spouse Name: N/A    Number of Children: 2  . Years of Education: N/A   Occupational History  .     Social History Main Topics  . Smoking status: Never Smoker   . Smokeless tobacco: Not on file  . Alcohol Use: No  . Drug Use: No     history of cocaine, etoh, marijuana but denies any the last several years  . Sexually Active: Not on file    Other Topics Concern  . Not on file   Social History Narrative  . No narrative on file      ROS:  General: Negative for anorexia, weight loss, fever, chills, fatigue, weakness. Eyes: Negative for vision changes.  ENT: Negative for hoarseness, difficulty swallowing. Treatment for sinusitis did not help cough. CV: Negative for chest pain, angina, palpitations, dyspnea on exertion, peripheral edema.  Respiratory: Negative for dyspnea at rest, dyspnea on exertion, cough, sputum, wheezing.  GI: See history of present illness. GU:  Negative for dysuria, hematuria, urinary incontinence, urinary frequency, nocturnal urination.  MS: Negative for joint pai. Chronic low back pain.  Derm: Negative for rash or itching.  Neuro: Negative for weakness, abnormal sensation, seizure, frequent headaches, memory loss, confusion.  Psych: Negative for anxiety, depression, suicidal ideation, hallucinations.  Endo: Negative for unusual weight change.  Heme: Negative for bruising or bleeding. Allergy: Negative for rash or hives.    Physical Examination:  BP 127/78  Pulse 70  Temp(Src) 97.4 F (36.3 C) (Temporal)  Ht 5\' 10"  (1.778 m)  Wt 250 lb 3.2 oz (113.49 kg)  BMI 35.90 kg/m2   General: Well-nourished, well-developed in no acute distress.  Head: Normocephalic, atraumatic.   Eyes: Conjunctiva pink, no icterus. Mouth: Oropharyngeal mucosa moist and pink , no lesions erythema or exudate. Neck: Supple without thyromegaly, masses, or lymphadenopathy.  Lungs: Clear to auscultation bilaterally.  Heart: Regular rate and rhythm, no murmurs rubs or gallops.  Abdomen: Bowel sounds are normal, nontender, nondistended, no hepatosplenomegaly or masses, no abdominal bruits or    hernia , no rebound or guarding.   Rectal: Defer to time of TCS.  Extremities: No lower extremity edema. No clubbing or deformities. Right hand bandaged. Neuro: Alert and oriented x 4 , grossly normal neurologically.  Skin: Warm  and dry, no rash or jaundice.   Psych: Alert and cooperative, normal mood and affect.  Labs: Lab Results  Component Value Date   WBC 4.7 04/18/2010   HGB 12.9* 04/18/2010   HCT 37.2* 04/18/2010   MCV 86.1 04/18/2010   PLT 165 04/18/2010   No  results found for this basename: IRON,  TIBC,  FERRITIN     Imaging Studies: Dg Hand Complete Right  11-11-10  *RADIOLOGY REPORT*  Clinical Data: Laceration to hand.  RIGHT HAND - COMPLETE 3+ VIEW  Comparison: None  Findings: There is no evidence of fracture, subluxation or dislocation. A tiny linear foreign body is identified overlying the soft tissues lateral to the first metacarpal. No bony abnormalities are present.  IMPRESSION: Tiny linear foreign body overlying the soft tissues lateral to the first metacarpal  Original Report Authenticated By: Rosendo Gros, M.D.

## 2010-12-08 ENCOUNTER — Ambulatory Visit (HOSPITAL_COMMUNITY)
Admission: RE | Admit: 2010-12-08 | Discharge: 2010-12-08 | Disposition: A | Discharge status: Home / Self Care | Payer: Medicare Other | Source: Ambulatory Visit | Attending: *Deleted | Admitting: *Deleted

## 2010-12-08 NOTE — Progress Notes (Signed)
REVIEWED. AGREE. TCS/EGD/?ED on Plavix w/ MAC.

## 2010-12-08 NOTE — Progress Notes (Signed)
Physical Therapy Treatment Patient Details  Name: Dalton [name] MRN: [id number] Date of Birth: [birth year]  Today's Date: 12/08/2010 Time: 4098-1191 Time Calculation (min): 44 min Visit#: 4  of 8   Re-eval: 12/17/10 Charges: Therex x 40'  Subjective: Symptoms/Limitations Symptoms: Pt reports he is having 10/10 pain in his back but denies need for visit to ED. Pt reports he is going to MD about the wound on his hand tomorrow. Pain Assessment Currently in Pain?: Yes Pain Score: 10-Worst pain ever Pain Location: Back Pain Orientation: Lower;Mid   Exercise/Treatments Stretches Active Hamstring Stretch: 3 reps;30 seconds Single Knee to Chest Stretch: 3 reps;30 seconds Lumbar Exercises Stability Clam: 20 reps;Supine Bridge: 20 reps Bent Knee Raise: 15 reps Isometric Hip Flexion: 15 reps;5 seconds Straight Leg Raise: 10 reps Hip Abduction: 15 reps;Side-lying;10 reps (15 on R; 10 on L) Heel Squeeze: Prone;15 reps;5 seconds Leg Raise: 10 reps;Right;Left;Prone Opposite Arm/Leg Raise: 5 reps;Prone;Right arm/Left leg;Left arm/Right leg  Physical Therapy Assessment and Plan PT Assessment and Plan Clinical Impression Statement: Pt displays decreased quad strength with active ham stretch. TM held today secondary to 10/10 pain. Pt completes therex with minimal need for cueing. Pt reports no change in pain at the end of session. PT Treatment/Interventions: Therapeutic exercise PT Plan: Progress clams to SL next session.     Problem List Patient Active Problem List  Diagnoses  . CARPAL TUNNEL SYNDROME  . SHOULDER IMPINGEMENT SYNDROME, LEFT  . Lumbar pain with radiation down left leg  . GERD (gastroesophageal reflux disease)  . Esophageal dysphagia  . Constipation  . Laryngopharyngeal reflux (LPR)  . Anemia    PT - End of Session Activity Tolerance: Patient tolerated treatment well General Behavior During Session: [facility] for tasks performed Cognition: [facility] for tasks  performed  [name] 12/08/2010, 2:24 PM

## 2010-12-09 NOTE — Progress Notes (Signed)
Cc to PCP 

## 2010-12-10 ENCOUNTER — Inpatient Hospital Stay (HOSPITAL_COMMUNITY): Admission: RE | Admit: 2010-12-10 | Payer: Medicare Other | Source: Ambulatory Visit | Admitting: Physical Therapy

## 2010-12-15 ENCOUNTER — Telehealth (HOSPITAL_COMMUNITY): Payer: Self-pay

## 2010-12-15 ENCOUNTER — Inpatient Hospital Stay (HOSPITAL_COMMUNITY): Admission: RE | Admit: 2010-12-15 | Payer: Medicare Other | Source: Ambulatory Visit | Admitting: Physical Therapy

## 2010-12-17 ENCOUNTER — Ambulatory Visit (HOSPITAL_COMMUNITY)
Admission: RE | Admit: 2010-12-17 | Discharge: 2010-12-17 | Disposition: A | Discharge status: Home / Self Care | Payer: Medicare Other | Source: Ambulatory Visit | Attending: Family Medicine | Admitting: Family Medicine

## 2010-12-17 NOTE — Progress Notes (Signed)
Physical Therapy Treatment Patient Details  Name: QUENTION MCNEILL MRN: [id number] Date of Birth: 1960/04/07  Today's Date: 12/17/2010 Time: 1102-1150 Time Calculation (min): 48 min Visit#: 5  of 8   Re-eval: 12/29/10  Charge: therex 30 min MMT 1 unit ROM Measurement 1 unit  Subjective: Symptoms/Limitations Symptoms: Pt reports that he is having 9/10 pain.  During reassessment questions pt stated he noticed a decrease in pain scale by 2 points, and able to more with increased ease. Pain Assessment Currently in Pain?: Yes Pain Score:   9 Pain Location: Back Pain Orientation: Lower  Objective: B LE strength 5/5 WNL SB bilateral WNL Rotation bilateral decrease 10% Lumbar flexion decreased with 16 in from finger tips to floor  Exercise/Treatments Stretches Active Hamstring Stretch: 3 reps;30 seconds Single Knee to Chest Stretch: 3 reps;30 seconds Stability Clam: Side-lying;15 reps Bridge: 20 reps Bent Knee Raise: 15 reps Ab Set: 10 reps;5 seconds Heel Squeeze: 10 reps;5 reps;Prone Single Arm Raise: Prone;15 reps Leg Raise: Prone;15 reps Opposite Arm/Leg Raise: Prone;10 reps  Physical Therapy Assessment and Plan PT Assessment and Plan Clinical Impression Statement: Reassessment complete per 30 day protocol.  Pt presented with increased strength, all WNL.  For ROM sidebending bilateral WNL, rotation decreased 10% bilateral, lumbar flexion decreased with 16 in from finger tips to floor.  Pt stated he noticed a decrease in pain scale by 2 points, and able to more with increased ease.  Following discussion with PT found appropriate to continue for 3 more session to address stability and pain reduction.  Pt able to complete all therex with minimal cueing required for stability. PT Plan: Continue PT for 3 more sessions.    Goals PT Short Term Goals Time to Complete Short Term Goals: 2 weeks PT Short Term Goal 1: Pt to have full ROM to be able to pick items off the floor PT  Short Term Goal 1 - Progress: Progressing toward goal PT Short Term Goal 2: Pt Pain level to be decreased by 2 PT Short Term Goal 2 - Progress: Met PT Short Term Goal 3: Pt to have pain only to knee level  PT Short Term Goal 3 - Progress: Not met PT Long Term Goals Time to Complete Long Term Goals: 4 weeks PT Long Term Goal 1: Pt pain decreased by 5 PT Long Term Goal 1 - Progress: Met PT Long Term Goal 2: Pt not to have radicular pain for 1 week PT Long Term Goal 2 - Progress: Not met Long Term Goal 3: Pt to begin working again. Long Term Goal 3 Progress: Not met  Problem List Patient Active Problem List  Diagnoses  . CARPAL TUNNEL SYNDROME  . SHOULDER IMPINGEMENT SYNDROME, LEFT  . Lumbar pain with radiation down left leg  . GERD (gastroesophageal reflux disease)  . Esophageal dysphagia  . Constipation  . Laryngopharyngeal reflux (LPR)  . Anemia    PT - End of Session Activity Tolerance: Patient tolerated treatment well General Behavior During Session: [facility] for tasks performed Cognition: [facility] for tasks performed  Juel Burrow 12/17/2010, 12:14 PM

## 2010-12-22 ENCOUNTER — Telehealth (HOSPITAL_COMMUNITY): Payer: Self-pay

## 2010-12-22 ENCOUNTER — Encounter (HOSPITAL_COMMUNITY)
Admission: RE | Admit: 2010-12-22 | Discharge: 2010-12-22 | Disposition: A | Discharge status: Home / Self Care | Payer: Medicare Other | Source: Ambulatory Visit | Attending: Gastroenterology | Admitting: Gastroenterology

## 2010-12-22 ENCOUNTER — Encounter (HOSPITAL_COMMUNITY): Payer: Self-pay

## 2010-12-22 ENCOUNTER — Other Ambulatory Visit: Payer: Self-pay

## 2010-12-22 ENCOUNTER — Ambulatory Visit (HOSPITAL_COMMUNITY): Payer: Medicare Other | Admitting: *Deleted

## 2010-12-22 HISTORY — DX: Heart failure, unspecified: I50.9

## 2010-12-22 HISTORY — DX: Gastro-esophageal reflux disease without esophagitis: K21.9

## 2010-12-22 HISTORY — DX: Anemia, unspecified: D64.9

## 2010-12-22 HISTORY — DX: Frequency of micturition: R35.0

## 2010-12-22 LAB — BASIC METABOLIC PANEL
BUN: 5 mg/dL — ABNORMAL LOW (ref 6–23)
CO2: 29 mEq/L (ref 19–32)
Chloride: 106 mEq/L (ref 96–112)
GFR calc Af Amer: 87 mL/min — ABNORMAL LOW (ref >90)
Potassium: 3.4 mEq/L — ABNORMAL LOW (ref 3.5–5.1)

## 2010-12-22 LAB — CBC
HCT: 38.8 % — ABNORMAL LOW (ref 39.0–52.0)
Hemoglobin: 13.5 g/dL (ref 13.0–17.0)
RBC: 4.48 MIL/uL (ref 4.22–5.81)
RDW: 13.1 % (ref 11.5–15.5)
WBC: 4.3 10*3/uL (ref 4.0–10.5)

## 2010-12-22 NOTE — Patient Instructions (Addendum)
[address]  12/22/2010   Your procedure is scheduled on:  12/28/2010  Report to [facility] at  700  AM.  Call this number if you have problems the morning of surgery: [phone]   Remember:   Do not eat food:After Midnight.  Do not drink clear liquids: After Midnight.  Take these medicines the morning of surgery with A SIP OF WATER: flomax,zyrtec,keppra,lisinopril,lopressor,prilosec,percocet. Take albuterol and nasonex before you come.   Do not wear jewelry, make-up or nail polish.  Do not wear lotions, powders, or perfumes. You may wear deodorant.  Do not shave 48 hours prior to surgery.  Do not bring valuables to the hospital.  Contacts, dentures or bridgework may not be worn into surgery.  Leave suitcase in the car. After surgery it may be brought to your room.  For patients admitted to the hospital, checkout time is 11:00 AM the day of discharge.   Patients discharged the day of surgery will not be allowed to drive home.  Name and phone number of your driver: family  Special Instructions: N/A   Please read over the following fact sheets that you were given: Pain Booklet, Surgical Site Infection Prevention, Anesthesia Post-op Instructions and Care and Recovery After Surgery Colonoscopy A colonoscopy is an exam to evaluate your entire colon. In this exam, your colon is cleansed. A long fiberoptic tube is inserted through your rectum and into your colon. The fiberoptic scope (endoscope) is a long bundle of enclosed and very flexible fibers. These fibers transmit light to the area examined and send images from that area to your caregiver. Discomfort is usually minimal. You may be given a drug to help you sleep (sedative) during or prior to the procedure. This exam helps to detect lumps (tumors), polyps, inflammation, and areas of bleeding. Your caregiver may also take a small piece of tissue (biopsy) that will be examined under a microscope. LET YOUR CAREGIVER KNOW ABOUT:    Allergies to food or medicine.   Medicines taken, including vitamins, herbs, eyedrops, over-the-counter medicines, and creams.   Use of steroids (by mouth or creams).   Previous problems with anesthetics or numbing medicines.   History of bleeding problems or blood clots.   Previous surgery.   Other health problems, including diabetes and kidney problems.   Possibility of pregnancy, if this applies.  BEFORE THE PROCEDURE   A clear liquid diet may be required for 2 days before the exam.   Ask your caregiver about changing or stopping your regular medications.   Liquid injections (enemas) or laxatives may be required.   A large amount of electrolyte solution may be given to you to drink over a short period of time. This solution is used to clean out your colon.   You should be present 60 minutes prior to your procedure or as directed by your caregiver.  AFTER THE PROCEDURE   If you received a sedative or pain relieving medication, you will need to arrange for someone to drive you home.   Occasionally, there is a little blood passed with the first bowel movement. Do not be concerned.  FINDING OUT THE RESULTS OF YOUR TEST Not all test results are available during your visit. If your test results are not back during the visit, make an appointment with your caregiver to find out the results. Do not assume everything is normal if you have not heard from your caregiver or the medical facility. It is important for you to follow up on  all of your test results. HOME CARE INSTRUCTIONS   It is not unusual to pass moderate amounts of gas and experience mild abdominal cramping following the procedure. This is due to air being used to inflate your colon during the exam. Walking or a warm pack on your belly (abdomen) may help.   You may resume all normal meals and activities after sedatives and medicines have worn off.   Only take over-the-counter or prescription medicines for pain,  discomfort, or fever as directed by your caregiver. Do not use aspirin or blood thinners if a biopsy was taken. Consult your caregiver for medicine usage if biopsies were taken.  SEEK IMMEDIATE MEDICAL CARE IF:   You have a fever.   You pass large blood clots or fill a toilet with blood following the procedure. This may also occur 10 to 14 days following the procedure. This is more likely if a biopsy was taken.   You develop abdominal pain that keeps getting worse and cannot be relieved with medicine.  Document Released: 02/06/2000 Document Revised: 10/21/2010 Document Reviewed: 09/21/2007 [facility] Patient Information 2012 [place], [place].Esophagogastroduodenoscopy This is an endoscopic procedure (a procedure that uses a device like a flexible telescope) that allows your caregiver to view the upper stomach and small bowel. This test allows your caregiver to look at the esophagus. The esophagus carries food from your mouth to your stomach. They can also look at your duodenum. This is the first part of the small intestine that attaches to the stomach. This test is used to detect problems in the bowel such as ulcers and inflammation. PREPARATION FOR TEST Nothing to eat after midnight the day before the test. NORMAL FINDINGS Normal esophagus, stomach, and duodenum. Ranges for normal findings may vary among different laboratories and hospitals. You should always check with your doctor after having lab work or other tests done to discuss the meaning of your test results and whether your values are considered within normal limits. MEANING OF TEST  Your caregiver will go over the test results with you and discuss the importance and meaning of your results, as well as treatment options and the need for additional tests if necessary. OBTAINING THE TEST RESULTS It is your responsibility to obtain your test results. Ask the lab or department performing the test when and how you will get your  results. Document Released: 06/11/2004 Document Revised: 10/21/2010 Document Reviewed: 01/19/2008 [facility] Patient Information 2012 [place], [place].PATIENT INSTRUCTIONS POST-ANESTHESIA  IMMEDIATELY FOLLOWING SURGERY:  Do not drive or operate machinery for the first twenty four hours after surgery.  Do not make any important decisions for twenty four hours after surgery or while taking narcotic pain medications or sedatives.  If you develop intractable nausea and vomiting or a severe headache please notify your doctor immediately.  FOLLOW-UP:  Please make an appointment with your surgeon as instructed. You do not need to follow up with anesthesia unless specifically instructed to do so.  WOUND CARE INSTRUCTIONS (if applicable):  Keep a dry clean dressing on the anesthesia/puncture wound site if there is drainage.  Once the wound has quit draining you may leave it open to air.  Generally you should leave the bandage intact for twenty four hours unless there is drainage.  If the epidural site drains for more than 36-48 hours please call the anesthesia department.  QUESTIONS?:  Please feel free to call your physician or the hospital operator if you have any questions, and they will be happy to assist you.  [facility] Anesthesia Department [address] [place] [phone]

## 2010-12-23 LAB — CBC WITH DIFFERENTIAL/PLATELET
Basophils Absolute: 0 10*3/uL (ref 0.0–0.1)
Basophils Relative: 0 % (ref 0–1)
Hemoglobin: 13.7 g/dL (ref 13.0–17.0)
Lymphocytes Relative: 43 % (ref 12–46)
MCHC: 34.3 g/dL (ref 30.0–36.0)
Neutro Abs: 2.1 10*3/uL (ref 1.7–7.7)
Neutrophils Relative %: 48 % (ref 43–77)
RDW: 13.3 % (ref 11.5–15.5)
WBC: 4.4 10*3/uL (ref 4.0–10.5)

## 2010-12-23 NOTE — Progress Notes (Signed)
Quick Note:  Unable to reach pt at either number. Mailing a letter. ______

## 2010-12-23 NOTE — Progress Notes (Signed)
Quick Note:  Pt called and was informed. I did not have to mail a letter. ______

## 2010-12-23 NOTE — Progress Notes (Signed)
Quick Note:  CBC good. Procedures as planned. ______

## 2010-12-26 NOTE — H&P (Signed)
Reason for Visit     Gastrophageal Reflux        Vitals - Last Recorded       BP Pulse Temp(Src) Ht Wt BMI    127/78  70  97.4 F (36.3 C) (Temporal)  5\' 10"  (1.778 m)  250 lb 3.2 oz (113.49 kg)  35.90 kg/m2       Progress Notes     Tana Coast, PA  12/07/2010  9:17 PM  Signed Primary Care Physician:  Reynolds Bowl, MD   Primary Gastroenterologist:  Jonette Eva, MD      Chief Complaint   Patient presents with   .  Gastrophageal Reflux      HPI:  Dalton [name] is a 50 y.o. male here for further evaluation of gerd symptoms/?LPR. C/O dry cough, throat irritation. Wakes up in middle of night with it. Going on for several months. Happening during night and day. Try to drink hot liquid or cold liquid may help at times. Throat clearing with meals. No heartburn on omeprazole. Never tried PPI BID. No abdominal pain. Epigastric region feels real tight, happens with and without meal. No n/v. BM, some intermittent constipation. Sometimes has to use mag citrate, takes 3-4 times per month. Symptoms worse with pain medications. Used to use Miralax but not as effective as mag citrate. Appetite poor. No significant weight loss. No melena, brbpr. Difficulty swallowing solid foods.    Mild anemia during hospitalization earlier this year. Recent laceration of his hand.     Current Outpatient Prescriptions   Medication  Sig  Dispense  Refill   .  albuterol (PROVENTIL HFA;VENTOLIN HFA) 108 (90 BASE) MCG/ACT inhaler  Inhale 2 puffs into the lungs every 6 (six) hours as needed. Shortness of breath          .  aspirin 325 MG EC tablet  Take 325 mg by mouth daily.           [name] Kitchen  atorvastatin (LIPITOR) 40 MG tablet  Take 40 mg by mouth daily.           .  cetirizine (ZYRTEC) 10 MG tablet  Take 10 mg by mouth daily.           .  clindamycin (CLEOCIN) 150 MG capsule  Take 150 mg by mouth 3 (three) times daily.           .  clopidogrel (PLAVIX) 75 MG tablet  Take 75 mg by mouth daily.            .  furosemide (LASIX) 40 MG tablet  Take 40 mg by mouth daily.           [name] Kitchen  lisinopril (PRINIVIL,ZESTRIL) 10 MG tablet  Take 10 mg by mouth daily.           .  metoprolol tartrate (LOPRESSOR) 25 MG tablet  Take 25 mg by mouth 2 (two) times daily.           .  mometasone (NASONEX) 50 MCG/ACT nasal spray  Place 2 sprays into the nose daily.           [name] Kitchen  omeprazole (PRILOSEC) 20 MG capsule  Take 20 mg by mouth daily.           [name] Kitchen  oxyCODONE-acetaminophen (PERCOCET) 10-325 MG per tablet  Take 1 tablet by mouth every 4 (four) hours as needed. pain          .  Tamsulosin HCl (  FLOMAX) 0.4 MG CAPS  Take by mouth.           [name] Kitchen  UNKNOWN TO PATIENT  Medication for seizures.          .  methocarbamol (ROBAXIN) 500 MG tablet  2 po tid for spam   30 tablet   0   .  ofloxacin (FLOXIN) 400 MG tablet  Take 400 mg by mouth 2 (two) times daily.               Allergies as of 12/07/2010 - Review Complete 12/07/2010   Allergen  Reaction  Noted   .  Gabapentin  Hives     .  Ibuprofen  Other (See Comments)     .  Zolpidem tartrate       .  Naproxen  Rash         Past Medical History   Diagnosis  Date   .  Hypertension     .  High cholesterol     .  Congestive heart failure (CHF)     .  Arthritis     .  MI (myocardial infarction)         7, last one was in 2011   .  Seizures         entire life, last seizure in 2011   .  PUD (peptic ulcer disease)         in 1990s, secondary to medication   .  Laceration of right hand  11/27/10   .  Chronic back pain         [facility] in [place]   .  Chronic bronchitis         Past Surgical History   Procedure  Date   .  Coronary artery bypass graft  2002   .  Back surgery         4   .  Carpal tunnel release         bilateral   .  Knee surgery         left       Family History   Problem  Relation  Age of Onset   .  Diabetes  Mother     .  Hypertension  Mother     .  Hypertension  Father     .  Diabetes  Father     .  Heart attack            mother, father, brother, sister all deceased due to MI   .  Colon cancer  Neg Hx     .  Liver disease  Neg Hx         History       Social History   .  Marital Status:  Single       Spouse Name:  N/A       Number of Children:  2   .  Years of Education:  N/A       Occupational History   .           Social History Main Topics   .  Smoking status:  Never Smoker    .  Smokeless tobacco:  Not on file   .  Alcohol Use:  No   .  Drug Use:  No         history of cocaine, etoh, marijuana but denies any the last several years   .  Sexually Active:  Not  on file       Other Topics  Concern   .  Not on file       Social History Narrative   .  No narrative on file        ROS:   General: Negative for anorexia, weight loss, fever, chills, fatigue, weakness. Eyes: Negative for vision changes.   ENT: Negative for hoarseness, difficulty swallowing. Treatment for sinusitis did not help cough. CV: Negative for chest pain, angina, palpitations, dyspnea on exertion, peripheral edema.   Respiratory: Negative for dyspnea at rest, dyspnea on exertion, cough, sputum, wheezing.   GI: See history of present illness. GU:  Negative for dysuria, hematuria, urinary incontinence, urinary frequency, nocturnal urination.   MS: Negative for joint pai. Chronic low back pain.   Derm: Negative for rash or itching.   Neuro: Negative for weakness, abnormal sensation, seizure, frequent headaches, memory loss, confusion.   Psych: Negative for anxiety, depression, suicidal ideation, hallucinations.   Endo: Negative for unusual weight change.   Heme: Negative for bruising or bleeding. Allergy: Negative for rash or hives.     Physical Examination:   BP 127/78  Pulse 70  Temp(Src) 97.4 F (36.3 C) (Temporal)  Ht 5\' 10"  (1.778 m)  Wt 250 lb 3.2 oz (113.49 kg)  BMI 35.90 kg/m2    General: Well-nourished, well-developed in no acute distress.   Head: Normocephalic, atraumatic.    Eyes:  Conjunctiva pink, no icterus. Mouth: Oropharyngeal mucosa moist and pink , no lesions erythema or exudate. Neck: Supple without thyromegaly, masses, or lymphadenopathy.   Lungs: Clear to auscultation bilaterally.   Heart: Regular rate and rhythm, no murmurs rubs or gallops.   Abdomen: Bowel sounds are normal, nontender, nondistended, no hepatosplenomegaly or masses, no abdominal bruits or    hernia , no rebound or guarding.    Rectal: Defer to time of TCS.   Extremities: No lower extremity edema. No clubbing or deformities. Right hand bandaged. Neuro: Alert and oriented x 4 , grossly normal neurologically.   Skin: Warm and dry, no rash or jaundice.    Psych: Alert and cooperative, normal mood and affect.   Labs: Lab Results   Component  Value  Date     WBC  4.7  04/18/2010     HGB  12.9*  04/18/2010     HCT  37.2*  04/18/2010     MCV  86.1  04/18/2010     PLT  165  04/18/2010    No results found for this basename: IRON,  TIBC,  FERRITIN        Imaging Studies: Dg Hand Complete Right   11/08/2010  *RADIOLOGY REPORT*  Clinical Data: Laceration to hand.  RIGHT HAND - COMPLETE 3+ VIEW  Comparison: None  Findings: There is no evidence of fracture, subluxation or dislocation. A tiny linear foreign body is identified overlying the soft tissues lateral to the first metacarpal. No bony abnormalities are present.  IMPRESSION: Tiny linear foreign body overlying the soft tissues lateral to the first metacarpal  Original Report Authenticated By: Rosendo Gros, M.D.            Jonette Eva, MD  12/08/2010  9:28 AM  Signed REVIEWED. AGREE. TCS/EGD/?ED on Plavix w/ MAC.  Glendora Score  12/09/2010  3:42 PM  Signed Cc to PCP  Tana Coast, PA  12/23/2010  8:04 AM  Signed Quick Note:   CBC good. Procedures as planned. ______  Cloria Spring, LPN, LPN  16/11/9602 10:36  AM  Signed Quick Note:   Unable to reach pt at either number. Mailing a letter. ______  Cloria Spring, LPN, LPN   21/30/8657 10:39 AM  Signed Quick Note:   Pt called and was informed. I did not have to mail a letter. ______     Esophageal dysphagia - Tana Coast, PA  12/07/2010  9:11 PM  Signed UGI symptoms consist of typical heartburn well-controlled on omeprazole but chronic dry cough, hoarsenss/soreness, dysphagia to solids. ?esophageal web/ring/stricture/esophagitis. ?LPR. EGD+/-ED in near future. Procedure to be done with deep sedation in OR given h/o chronic narcotic use. Patient denies ongoing cocaine use. Questioned multiple times, adamantly denies.    Constipation - Tana Coast, PA  12/07/2010  9:12 PM  Signed No prior TCS. 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 TCS in near future with deep sedation in OR due to chronic narcotics.  I have discussed the risks, alternatives, benefits with regards to but not limited to the risk of reaction to medication, bleeding, infection, perforation and the patient is agreeable to proceed. Written consent to be obtained.     Anemia - Tana Coast, PA  12/07/2010  9:13 PM  Signed Recheck CBC.

## 2010-12-28 ENCOUNTER — Other Ambulatory Visit: Payer: Self-pay | Admitting: Gastroenterology

## 2010-12-28 ENCOUNTER — Ambulatory Visit (HOSPITAL_COMMUNITY)
Admission: RE | Admit: 2010-12-28 | Discharge: 2010-12-28 | Disposition: A | Discharge status: Home / Self Care | Payer: Medicare Other | Source: Ambulatory Visit | Attending: Gastroenterology | Admitting: Gastroenterology

## 2010-12-28 ENCOUNTER — Ambulatory Visit (HOSPITAL_COMMUNITY): Payer: Medicare Other | Admitting: Anesthesiology

## 2010-12-28 ENCOUNTER — Encounter (HOSPITAL_COMMUNITY): Payer: Self-pay | Admitting: Anesthesiology

## 2010-12-28 ENCOUNTER — Encounter (HOSPITAL_COMMUNITY)
Admission: RE | Disposition: A | Discharge status: Home / Self Care | Payer: Self-pay | Source: Ambulatory Visit | Attending: Gastroenterology

## 2010-12-28 ENCOUNTER — Encounter (HOSPITAL_COMMUNITY): Payer: Self-pay

## 2010-12-28 DIAGNOSIS — R1310 Dysphagia, unspecified: Secondary | ICD-10-CM | POA: Insufficient documentation

## 2010-12-28 DIAGNOSIS — K648 Other hemorrhoids: Secondary | ICD-10-CM | POA: Insufficient documentation

## 2010-12-28 DIAGNOSIS — Q391 Atresia of esophagus with tracheo-esophageal fistula: Secondary | ICD-10-CM | POA: Insufficient documentation

## 2010-12-28 DIAGNOSIS — R933 Abnormal findings on diagnostic imaging of other parts of digestive tract: Secondary | ICD-10-CM

## 2010-12-28 DIAGNOSIS — Z1211 Encounter for screening for malignant neoplasm of colon: Secondary | ICD-10-CM

## 2010-12-28 DIAGNOSIS — D126 Benign neoplasm of colon, unspecified: Secondary | ICD-10-CM

## 2010-12-28 DIAGNOSIS — Z7982 Long term (current) use of aspirin: Secondary | ICD-10-CM | POA: Insufficient documentation

## 2010-12-28 DIAGNOSIS — Z01810 Encounter for preprocedural cardiovascular examination: Secondary | ICD-10-CM | POA: Insufficient documentation

## 2010-12-28 DIAGNOSIS — Z01812 Encounter for preprocedural laboratory examination: Secondary | ICD-10-CM | POA: Insufficient documentation

## 2010-12-28 DIAGNOSIS — K5909 Other constipation: Secondary | ICD-10-CM | POA: Insufficient documentation

## 2010-12-28 DIAGNOSIS — R1013 Epigastric pain: Secondary | ICD-10-CM | POA: Insufficient documentation

## 2010-12-28 DIAGNOSIS — K219 Gastro-esophageal reflux disease without esophagitis: Secondary | ICD-10-CM

## 2010-12-28 DIAGNOSIS — I10 Essential (primary) hypertension: Secondary | ICD-10-CM | POA: Insufficient documentation

## 2010-12-28 DIAGNOSIS — Q393 Congenital stenosis and stricture of esophagus: Secondary | ICD-10-CM | POA: Insufficient documentation

## 2010-12-28 HISTORY — PX: SAVORY DILATION: SHX5439

## 2010-12-28 HISTORY — PX: COLONOSCOPY: SHX174

## 2010-12-28 SURGERY — COLONOSCOPY WITH PROPOFOL
Anesthesia: Monitor Anesthesia Care | Site: Throat

## 2010-12-28 MED ORDER — LACTATED RINGERS IV SOLN
INTRAVENOUS | Status: DC
  Administered 2010-12-28: 09:00:00 via INTRAVENOUS

## 2010-12-28 MED ORDER — STERILE WATER FOR IRRIGATION IR SOLN
Status: DC | PRN
  Administered 2010-12-28: 09:00:00

## 2010-12-28 MED ORDER — GLYCOPYRROLATE 0.2 MG/ML IJ SOLN
0.2000 mg | Freq: Once | INTRAMUSCULAR | Status: AC
  Administered 2010-12-28: 0.2 mg via INTRAVENOUS

## 2010-12-28 MED ORDER — BUTAMBEN-TETRACAINE-BENZOCAINE 2-2-14 % EX AERO
INHALATION_SPRAY | CUTANEOUS | Status: DC | PRN
  Administered 2010-12-28: 1 via TOPICAL

## 2010-12-28 MED ORDER — LIDOCAINE HCL (PF) 1 % IJ SOLN
INTRAMUSCULAR | Status: AC
  Filled 2010-12-28: qty 5

## 2010-12-28 MED ORDER — ONDANSETRON HCL 4 MG/2ML IJ SOLN
INTRAMUSCULAR | Status: AC
  Administered 2010-12-28: 4 mg via INTRAVENOUS
  Filled 2010-12-28: qty 2

## 2010-12-28 MED ORDER — PROPOFOL 10 MG/ML IV EMUL
INTRAVENOUS | Status: AC
  Filled 2010-12-28: qty 20

## 2010-12-28 MED ORDER — FENTANYL CITRATE 0.05 MG/ML IJ SOLN
INTRAMUSCULAR | Status: AC
  Filled 2010-12-28: qty 2

## 2010-12-28 MED ORDER — GLYCOPYRROLATE 0.2 MG/ML IJ SOLN
INTRAMUSCULAR | Status: AC
  Administered 2010-12-28: 0.2 mg via INTRAVENOUS
  Filled 2010-12-28: qty 1

## 2010-12-28 MED ORDER — OMEPRAZOLE 20 MG PO CPDR: DELAYED_RELEASE_CAPSULE | ORAL | Status: DC

## 2010-12-28 MED ORDER — BUTAMBEN-TETRACAINE-BENZOCAINE 2-2-14 % EX AERO
1.0000 | INHALATION_SPRAY | Freq: Once | CUTANEOUS | Status: DC
  Filled 2010-12-28: qty 56

## 2010-12-28 MED ORDER — FENTANYL CITRATE 0.05 MG/ML IJ SOLN
INTRAMUSCULAR | Status: DC | PRN
  Administered 2010-12-28: 50 ug via INTRAVENOUS
  Administered 2010-12-28 (×2): 25 ug via INTRAVENOUS

## 2010-12-28 MED ORDER — WATER FOR IRRIGATION, STERILE IR SOLN
Status: DC | PRN
  Administered 2010-12-28: 50 mL

## 2010-12-28 MED ORDER — PROPOFOL 10 MG/ML IV EMUL
INTRAVENOUS | Status: DC | PRN
  Administered 2010-12-28: 100 ug/kg/min via INTRAVENOUS
  Administered 2010-12-28: 09:00:00 via INTRAVENOUS

## 2010-12-28 MED ORDER — MIDAZOLAM HCL 2 MG/2ML IJ SOLN
INTRAMUSCULAR | Status: AC
  Administered 2010-12-28: 2 mg via INTRAVENOUS
  Filled 2010-12-28: qty 2

## 2010-12-28 MED ORDER — MIDAZOLAM HCL 2 MG/2ML IJ SOLN
1.0000 mg | INTRAMUSCULAR | Status: DC | PRN
  Administered 2010-12-28: 2 mg via INTRAVENOUS

## 2010-12-28 MED ORDER — ONDANSETRON HCL 4 MG/2ML IJ SOLN
4.0000 mg | Freq: Once | INTRAMUSCULAR | Status: AC
  Administered 2010-12-28: 4 mg via INTRAVENOUS

## 2010-12-28 MED ORDER — LIDOCAINE HCL 1 % IJ SOLN
INTRAMUSCULAR | Status: DC | PRN
  Administered 2010-12-28: 10 mg via INTRADERMAL

## 2010-12-28 SURGICAL SUPPLY — 26 items
BLOCK BITE 60FR ADLT L/F BLUE (MISCELLANEOUS) ×4 IMPLANT
ELECT REM PT RETURN 9FT ADLT (ELECTROSURGICAL)
ELECTRODE REM PT RTRN 9FT ADLT (ELECTROSURGICAL) IMPLANT
FCP BXJMBJMB 240X2.8X (CUTTING FORCEPS)
FLOOR PAD 36X40 (MISCELLANEOUS) ×4
FORCEP RJ3 GP 1.8X160 W-NEEDLE (CUTTING FORCEPS) IMPLANT
FORCEPS BIOP RAD 4 LRG CAP 4 (CUTTING FORCEPS) ×2 IMPLANT
FORCEPS BIOP RJ4 240 W/NDL (CUTTING FORCEPS)
FORCEPS BXJMBJMB 240X2.8X (CUTTING FORCEPS) IMPLANT
INJECTOR/SNARE I SNARE (MISCELLANEOUS) IMPLANT
LUBRICANT JELLY 4.5OZ STERILE (MISCELLANEOUS) ×2 IMPLANT
MANIFOLD NEPTUNE II (INSTRUMENTS) ×2 IMPLANT
NDL SCLEROTHERAPY 25GX240 (NEEDLE) IMPLANT
NEEDLE SCLEROTHERAPY 25GX240 (NEEDLE) IMPLANT
PAD FLOOR 36X40 (MISCELLANEOUS) ×3 IMPLANT
PROBE APC STR FIRE (PROBE) IMPLANT
PROBE INJECTION GOLD (MISCELLANEOUS)
PROBE INJECTION GOLD 7FR (MISCELLANEOUS) IMPLANT
SENSATION SHORT THROW ×2 IMPLANT
SNARE ROTATE MED OVAL 20MM (MISCELLANEOUS) IMPLANT
SNARE SHORT THROW 13M SML OVAL (MISCELLANEOUS) IMPLANT
SYR 50ML LL SCALE MARK (SYRINGE) ×2 IMPLANT
TRAP SPECIMEN MUCOUS 40CC (MISCELLANEOUS) IMPLANT
TUBING ENDO SMARTCAP PENTAX (MISCELLANEOUS) ×8 IMPLANT
TUBING IRRIGATION ENDOGATOR (MISCELLANEOUS) ×4 IMPLANT
WATER STERILE IRR 1000ML POUR (IV SOLUTION) ×2 IMPLANT

## 2010-12-28 NOTE — Anesthesia Postprocedure Evaluation (Signed)
Anesthesia Post Note  Patient: Dalton [name]  Procedure(s) Performed:  COLONOSCOPY WITH PROPOFOL - in cecum @ 0900   withdrawal time .; ESOPHAGOGASTRODUODENOSCOPY (EGD) WITH PROPOFOL; SAVORY DILATION - dilated with 16mm  Anesthesia type: MAC  Patient location: PACU  Post pain: Pain level controlled  Post assessment: Post-op Vital signs reviewed, Patient's Cardiovascular Status Stable, Respiratory Function Stable, Patent Airway, No signs of Nausea or vomiting and Pain level controlled  Last Vitals:  Filed Vitals:   12/28/10 0946  BP: 98/65  Pulse:   Temp: 36.4 C  Resp: 20    Post vital signs: Reviewed and stable  Level of consciousness: awake and alert   Complications: No apparent anesthesia complications

## 2010-12-28 NOTE — Interval H&P Note (Signed)
History and Physical Interval Note:   12/28/2010   8:29 AM   Dalton [name]  has presented today for surgery, with the diagnosis of constipation,dysphagia, ANEMIA  The various methods of treatment have been discussed with the patient and family. After consideration of risks, benefits and other options for treatment, the patient has consented to  Procedure(s): COLONOSCOPY WITH PROPOFOL ESOPHAGOGASTRODUODENOSCOPY (EGD) WITH PROPOFOL SAVORY DILATION MALONEY DILATION as a surgical intervention .  The patients' history has been reviewed, patient examined, no change in status, stable for surgery.  I have reviewed the patients' chart and labs.  Questions were answered to the patient's satisfaction.     Jonette Eva  MD   THE PATIENT WAS EXAMINED AND THERE IS NO CHANGE IN THE PATIENT'S CONDITION SINCE THE ORIGINAL H&P WAS COMPLETED.

## 2010-12-28 NOTE — Anesthesia Procedure Notes (Signed)
Procedure Name: MAC Date/Time: 12/28/2010 8:45 AM Performed by: Minerva Areola Pre-anesthesia Checklist: Suction available, Timeout performed, Emergency Drugs available and Patient being monitored Patient Re-evaluated:Patient Re-evaluated prior to inductionOxygen Delivery Method: Simple face mask

## 2010-12-28 NOTE — Anesthesia Preprocedure Evaluation (Addendum)
Anesthesia Evaluation  Patient identified by MRN, date of birth, ID band Patient awake    Reviewed: Allergy & Precautions, H&P , NPO status , Patient's Chart, lab work & pertinent test results, reviewed documented beta blocker date and time   History of Anesthesia Complications Negative for: history of anesthetic complications  Airway Mallampati: III  Neck ROM: Full    Dental  (+) Edentulous Upper and Edentulous Lower   Pulmonary neg pulmonary ROS,    Pulmonary exam normal       Cardiovascular hypertension, Pt. on medications - angina+ CAD (no angina recently), + Past MI, + Cardiac Stents, + CABG and +CHF Regular Normal    Neuro/Psych    GI/Hepatic PUD, GERD-  Medicated and Controlled,  Endo/Other    Renal/GU      Musculoskeletal  (+) Arthritis - (chronic LBP),   Abdominal   Peds  Hematology   Anesthesia Other Findings   Reproductive/Obstetrics                           Anesthesia Physical Anesthesia Plan  ASA: III  Anesthesia Plan: MAC   Post-op Pain Management:    Induction:   Airway Management Planned: Simple Face Mask  Additional Equipment:   Intra-op Plan:   Post-operative Plan:   Informed Consent: I have reviewed the patients History and Physical, chart, labs and discussed the procedure including the risks, benefits and alternatives for the proposed anesthesia with the patient or authorized representative who has indicated his/her understanding and acceptance.     Plan Discussed with:   Anesthesia Plan Comments:         Anesthesia Quick Evaluation

## 2010-12-28 NOTE — Transfer of Care (Signed)
Immediate Anesthesia Transfer of Care Note  Patient: Dalton [name]  Procedure(s) Performed:  COLONOSCOPY WITH PROPOFOL - in cecum @ 0900   withdrawal time .; ESOPHAGOGASTRODUODENOSCOPY (EGD) WITH PROPOFOL; SAVORY DILATION - dilated with 16mm  Patient Location: PACU  Anesthesia Type: MAC  Level of Consciousness: awake  Airway & Oxygen Therapy: Patient Spontanous Breathing. Nasal cannula  Post-op Assessment: Report given to PACU RN, Post -op Vital signs reviewed and stable and Patient moving all extremities  Post vital signs: Reviewed and stable  Complications: No apparent anesthesia complications

## 2011-01-01 ENCOUNTER — Encounter (HOSPITAL_COMMUNITY): Payer: Self-pay | Admitting: Gastroenterology

## 2011-01-01 ENCOUNTER — Telehealth: Payer: Self-pay | Admitting: Gastroenterology

## 2011-01-01 NOTE — Telephone Encounter (Signed)
Please call pt. 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 His esophagus biopsies are normal. TCS in 10 years. High fiber diet.

## 2011-01-04 NOTE — Telephone Encounter (Signed)
Tried to call pt. Home number of [phone] was not a working number. I called the number [phone] and got a younger Nick Stults, who said the one I was trying to reach was his Dad. He said that he does not know where he lives or how to get in touch with him but he will try to find out and have him call me.

## 2011-01-04 NOTE — Telephone Encounter (Signed)
Reminder in epic to have tcs in 10 years 

## 2011-01-04 NOTE — Telephone Encounter (Signed)
Pt left VM for me to call him at the number [phone].  I called and had to leave VM to call.

## 2011-01-05 NOTE — Telephone Encounter (Signed)
Results Cc to PCP  

## 2011-01-11 NOTE — Telephone Encounter (Signed)
Letter of results mailed to pt. 

## 2011-03-18 ENCOUNTER — Ambulatory Visit (INDEPENDENT_AMBULATORY_CARE_PROVIDER_SITE_OTHER): Payer: Medicare Other | Admitting: Gastroenterology

## 2011-03-18 ENCOUNTER — Encounter: Payer: Self-pay | Admitting: Gastroenterology

## 2011-03-18 ENCOUNTER — Other Ambulatory Visit: Payer: Self-pay | Admitting: Gastroenterology

## 2011-03-18 VITALS — BP 127/80 | HR 88 | Temp 98.2°F | Ht 70.0 in | Wt 247.4 lb

## 2011-03-18 DIAGNOSIS — R1314 Dysphagia, pharyngoesophageal phase: Secondary | ICD-10-CM

## 2011-03-18 DIAGNOSIS — R1310 Dysphagia, unspecified: Secondary | ICD-10-CM

## 2011-03-18 DIAGNOSIS — K219 Gastro-esophageal reflux disease without esophagitis: Secondary | ICD-10-CM

## 2011-03-18 NOTE — Assessment & Plan Note (Signed)
Sx improved.  TAKE PRILOSEC 30 MINUTES PRIOR TO BREAKFAST AND SUPPER. LOSE 10 LBS. FOLLOW A LOW FAT DIET. SEE INFO BELOW. FOLLOW UP IN 4 MOS.

## 2011-03-18 NOTE — Progress Notes (Signed)
Reminder in epic to follow up in 4 months °

## 2011-03-18 NOTE — Progress Notes (Signed)
Subjective:    Patient ID: Dalton [name], male    DOB: 1960/09/04, 51 y.o.   MRN: [id number]  PCP: [facility]  HPI LAST SEEN nov 2012 FOR DYSPHAGIA. EMPIRIC DILATION PERFORMED. Pt really did not get any relief. Swallowing problems: every day, solids > liquids. Heartburn: a little. prilosec BID has helped heartburn. No nausea or vomiting. Appetite: varies. Bms:  Use regular but may need Mg Citrate 1 or 2 times a week. Lost 3 lbs. No etOH or cigs, snuff or tobacco. DisAbled from heart & back problems. Using Nasonex and Zyrtec daily. Has posterior nasal discharge: 3-4 a week. In the Am dry cough, blows nose but it's dry. Has sinus pressure qod. DOESN'T LIVE WITH A SMOKER.  Past Medical History  Diagnosis Date  . Hypertension   . High cholesterol   . Congestive heart failure (CHF)   . Arthritis   . MI (myocardial infarction)     7, last one was in 2011  . PUD (peptic ulcer disease)     in 1990s, secondary to medication  . Laceration of right hand 11/27/10  . Chronic back pain     [facility] [place]  . Chronic bronchitis   . CHF (congestive heart failure)   . Bronchitis, chronic   . Anemia   . GERD (gastroesophageal reflux disease)   . Frequency of urination   . Seizures     entire life, last seizure in 2011;unknown etiology-pt sts heriditary    Past Surgical History  Procedure Date  . Carpal tunnel release     bilateral  . Knee surgery     left-plate to left knee cap from accident  . Coronary artery bypass graft 2002    3 vessels  . Back surgery     3-  . Savory dilation 12/28/2010    Procedure: SAVORY DILATION;  Surgeon: Arlyce Harman, MD;  Location: [facility];  Service: Endoscopy;  Laterality: N/A;  dilated with 16mm   Allergies  Allergen Reactions  . Gabapentin Hives  . Ibuprofen Other (See Comments)    Stomach upset  . Naproxen     unknown  . Zolpidem Tartrate     Hallucinations     Current Outpatient Prescriptions  Medication Sig Dispense Refill  .  albuterol (PROVENTIL HFA;VENTOLIN HFA) 108 (90 BASE) MCG/ACT inhaler Inhale 2 puffs into the lungs every 6 (six) hours as needed. Shortness of breath       . aspirin 325 MG EC tablet Take 325 mg by mouth daily.        [name] Kitchen atorvastatin (LIPITOR) 10 MG tablet Take 10 mg by mouth daily.        . cetirizine (ZYRTEC) 10 MG tablet Take 10 mg by mouth daily.       . clopidogrel (PLAVIX) 75 MG tablet Take 75 mg by mouth daily.        . furosemide (LASIX) 40 MG tablet Take 40 mg by mouth daily.        [name] Kitchen levETIRAcetam (KEPPRA) 500 MG tablet Take 500 mg by mouth every 12 (twelve) hours.        [name] Kitchen lisinopril (PRINIVIL,ZESTRIL) 10 MG tablet Take 5 mg by mouth daily. Take half of a tablet    . methocarbamol (ROBAXIN) 500 MG tablet 2 po tid for spam    . metoprolol tartrate (LOPRESSOR) 25 MG tablet Take 25 mg by mouth 2 (two) times daily.      . mometasone (NASONEX) 50 MCG/ACT nasal spray Place  2 sprays into the nose daily as needed. For congestion    . omeprazole (PRILOSEC) 20 MG capsule 1 po 30 minutes prior to meals twice a day     . oxyCODONE-acetaminophen (PERCOCET) 10-325 MG per tablet Take 1 tablet by mouth 4 (four) times daily as needed. pain      . Tamsulosin HCl (FLOMAX) 0.4 MG CAPS Take 0.4 mg by mouth at bedtime.        Family History  Problem Relation Age of Onset  . Diabetes Mother   . Hypertension Mother   . Hypertension Father   . Diabetes Father   . Heart attack      mother, father, brother, sister all deceased due to MI  . Colon cancer Neg Hx   . Liver disease Neg Hx   . Anesthesia problems Neg Hx   . Hypotension Neg Hx   . Malignant hyperthermia Neg Hx   . Pseudochol deficiency Neg Hx        Review of Systems     Objective:   Physical Exam  Vitals reviewed. Constitutional: He is oriented to person, place, and time. He appears well-developed. No distress.  HENT:  Head: Normocephalic and atraumatic.  Mouth/Throat: Oropharynx is clear and moist. No oropharyngeal exudate.    Eyes: Pupils are equal, round, and reactive to light. No scleral icterus.  Neck: Normal range of motion. Neck supple.  Cardiovascular: Normal rate, regular rhythm and normal heart sounds.   Pulmonary/Chest: Effort normal and breath sounds normal. No respiratory distress.  Abdominal: Soft. Bowel sounds are normal. He exhibits no distension. There is no tenderness. There is no rebound and no guarding.  Musculoskeletal: He exhibits no edema.  Lymphadenopathy:    He has no cervical adenopathy.  Neurological: He is alert and oriented to person, place, and time.       NO FOCAL DEFICITS   Psychiatric:       FLAT AFFECT          Assessment & Plan:

## 2011-03-18 NOTE — Assessment & Plan Note (Addendum)
MOST LIKELY DUE TO eso motlity disorder,  less likely atypical  GERD, or non-ulcer dyspepsia.  TAKE PRILOSEC 30 MINUTES PRIOR TO BREAKFAST AND SUPPER. COMPLETE SWALLOWING STUDY. If no eso motility d/o, consider repeat egd w/ bravo. LOSE 10 LBS. FOLLOW A LOW FAT DIET. SEE INFO BELOW. Continue NASONEX & ZYRTEC. FOLLOW UP IN 4 MOS.

## 2011-03-18 NOTE — Patient Instructions (Addendum)
TAKE PRILOSEC 30 MINUTES PRIOR TO BREAKFAST AND SUPPER. COMPLETE YOUR SWALLOWING STUDY. LOSE 10 LBS. FOLLOW A LOW FAT DIET. SEE INFO BELOW. FOLLOW UP IN 4 MOS.  Low-Fat Diet BREADS, CEREALS, PASTA, RICE, DRIED PEAS, AND BEANS These products are high in carbohydrates and most are low in fat. Therefore, they can be increased in the diet as substitutes for fatty foods. They too, however, contain calories and should not be eaten in excess. Cereals can be eaten for snacks as well as for breakfast.  Include foods that contain fiber (fruits, vegetables, whole grains, and legumes). Research shows that fiber may lower blood cholesterol levels, especially the water-soluble fiber found in fruits, vegetables, oat products, and legumes. FRUITS AND VEGETABLES It is good to eat fruits and vegetables. Besides being sources of fiber, both are rich in vitamins and some minerals. They help you get the daily allowances of these nutrients. Fruits and vegetables can be used for snacks and desserts. MEATS Limit lean meat, chicken, [place], and fish to no more than 6 ounces per day. Beef, Pork, and Lamb Use lean cuts of beef, pork, and lamb. Lean cuts include:  Extra-lean ground beef.  Arm roast.  Sirloin tip.  Center-cut ham.  Round steak.  Loin chops.  Rump roast.  Tenderloin.  Trim all fat off the outside of meats before cooking. It is not necessary to severely decrease the intake of red meat, but lean choices should be made. Lean meat is rich in protein and contains a highly absorbable form of iron. Premenopausal women, in particular, should avoid reducing lean red meat because this could increase the risk for low red blood cells (iron-deficiency anemia).  Chicken and [place] These are good sources of protein. The fat of poultry can be reduced by removing the skin and underlying fat layers before cooking. Chicken and [place] can be substituted for lean red meat in the diet. Poultry should not be fried or covered  with high-fat sauces. Fish and Shellfish Fish is a good source of protein. Shellfish contain cholesterol, but they usually are low in saturated fatty acids. The preparation of fish is important. Like chicken and [place], they should not be fried or covered with high-fat sauces. EGGS Egg whites contain no fat or cholesterol. They can be eaten often. Try 1 to 2 egg whites instead of whole eggs in recipes or use egg substitutes that do not contain yolk.  MILK AND DAIRY PRODUCTS Use skim or 1% milk instead of 2% or whole milk. Decrease whole milk, natural, and processed cheeses. Use nonfat or low-fat (2%) cottage cheese or low-fat cheeses made from vegetable oils. Choose nonfat or low-fat (1 to 2%) yogurt. Experiment with evaporated skim milk in recipes that call for heavy cream. Substitute low-fat yogurt or low-fat cottage cheese for sour cream in dips and salad dressings. Have at least 2 servings of low-fat dairy products, such as 2 glasses of skim (or 1%) milk each day to help get your daily calcium intake.  FATS AND OILS Butterfat, lard, and beef fats are high in saturated fat and cholesterol. These should be avoided.Vegetable fats do not contain cholesterol. AVOID coconut oil, palm oil, and palm kernel oil, WHICH are very high in saturated fats. These should be limited. These fats are often used in bakery goods, processed foods, popcorn, oils, and nondairy creamers. Vegetable shortenings and some peanut butters contain hydrogenated oils, which are also saturated fats. Read the labels on these foods and check for saturated vegetable oils.  Desirable  liquid vegetable oils are corn oil, cottonseed oil, olive oil, canola oil, safflower oil, soybean oil, and sunflower oil. Peanut oil is not as good, but small amounts are acceptable. Buy a heart-healthy tub margarine that has no partially hydrogenated oils in the ingredients. AVOID Mayonnaise and salad dressings often are made from unsaturated fats.  OTHER  EATING TIPS Snacks  Most sweets should be limited as snacks. They tend to be rich in calories and fats, and their caloric content outweighs their nutritional value. Some good choices in snacks are graham crackers, melba toast, soda crackers, bagels (no egg), English muffins, fruits, and vegetables. These snacks are preferable to snack crackers, [place] fries, and chips. Popcorn should be air-popped or cooked in small amounts of liquid vegetable oil.  Desserts Eat fruit, low-fat yogurt, and fruit ices instead of pastries, cake, and cookies. Sherbet, angel food cake, gelatin dessert, frozen low-fat yogurt, or other frozen products that do not contain saturated fat (pure fruit juice bars, frozen ice pops) are also acceptable.   COOKING METHODS Choose those methods that use little or no fat. They include: Poaching.  Braising.  Steaming.  Grilling.  Baking.  Stir-frying.  Broiling.  Microwaving.  Foods can be cooked in a nonstick pan without added fat, or use a nonfat cooking spray in regular cookware. Limit fried foods and avoid frying in saturated fat. Add moisture to lean meats by using water, broth, cooking wines, and other nonfat or low-fat sauces along with the cooking methods mentioned above. Soups and stews should be chilled after cooking. The fat that forms on top after a few hours in the refrigerator should be skimmed off. When preparing meals, avoid using excess salt. Salt can contribute to raising blood pressure in some people.  EATING AWAY FROM HOME Order entres, potatoes, and vegetables without sauces or butter. When meat exceeds the size of a deck of cards (3 to 4 ounces), the rest can be taken home for another meal. Choose vegetable or fruit salads and ask for low-calorie salad dressings to be served on the side. Use dressings sparingly. Limit high-fat toppings, such as bacon, crumbled eggs, cheese, sunflower seeds, and olives. Ask for heart-healthy tub margarine instead of butter.

## 2011-03-18 NOTE — Progress Notes (Signed)
Faxed to PCP

## 2011-03-22 ENCOUNTER — Ambulatory Visit (HOSPITAL_COMMUNITY)
Admission: RE | Admit: 2011-03-22 | Discharge: 2011-03-22 | Disposition: A | Discharge status: Home / Self Care | Payer: Medicare Other | Source: Ambulatory Visit | Attending: Gastroenterology | Admitting: Gastroenterology

## 2011-03-22 ENCOUNTER — Telehealth: Payer: Self-pay | Admitting: Gastroenterology

## 2011-03-22 DIAGNOSIS — R1310 Dysphagia, unspecified: Secondary | ICD-10-CM | POA: Insufficient documentation

## 2011-03-22 DIAGNOSIS — K219 Gastro-esophageal reflux disease without esophagitis: Secondary | ICD-10-CM | POA: Insufficient documentation

## 2011-03-22 NOTE — Telephone Encounter (Signed)
PLEASE CALL PT. HIS SWALLOWING STUDY SHOWS HE HAS REFLUX & HIS ESOPHAGUS MOVES NORMALLY. IF HIS REFLUX IS NOT WELL CONTROLLED, HE WILL HAVE DIFFICULTY SWALLOWING. HE NEEDS TO LOSE 10 LBS BY HIS NEXT VISIT & STRICTLY FOLLOW A LOW FAT DIET. OPV IN 4 MOS.

## 2011-03-22 NOTE — Telephone Encounter (Signed)
Pt is aware of OV on 2/5 at 0900 with [facility] and has reminder in epic to follow up in 4 months

## 2011-03-22 NOTE — Telephone Encounter (Signed)
Pt informed

## 2011-03-30 ENCOUNTER — Encounter: Payer: Self-pay | Admitting: Gastroenterology

## 2011-03-30 ENCOUNTER — Ambulatory Visit (INDEPENDENT_AMBULATORY_CARE_PROVIDER_SITE_OTHER): Payer: Medicare Other | Admitting: Gastroenterology

## 2011-03-30 DIAGNOSIS — K219 Gastro-esophageal reflux disease without esophagitis: Secondary | ICD-10-CM

## 2011-03-30 DIAGNOSIS — R1314 Dysphagia, pharyngoesophageal phase: Secondary | ICD-10-CM

## 2011-03-30 NOTE — Progress Notes (Signed)
Patient presented to office today for inadvertent duplicate appointment. Last seen 03/18/11 by Dr. [name]. Discussed with patient. Needs to continue management of reflux per recommendations from 03/22/11. OPV 4 months as planned or sooner if symptoms change.

## 2011-03-31 NOTE — Progress Notes (Signed)
REVIEWED.  

## 2011-04-05 ENCOUNTER — Telehealth: Payer: Self-pay | Admitting: Gastroenterology

## 2011-04-05 NOTE — Telephone Encounter (Signed)
Pt called asking about referral to an ENT. He can be reached at [phone]

## 2011-04-12 NOTE — Telephone Encounter (Signed)
Sent to Dr [name] for further instructions- could not find anything in pts chart about referral

## 2011-04-21 NOTE — Telephone Encounter (Signed)
REFER PT TO ENT FOR PND & SINUS PROBLEMS.

## 2011-05-18 ENCOUNTER — Emergency Department (HOSPITAL_COMMUNITY): Payer: Medicare Other

## 2011-05-18 ENCOUNTER — Encounter (HOSPITAL_COMMUNITY): Payer: Self-pay | Admitting: *Deleted

## 2011-05-18 ENCOUNTER — Other Ambulatory Visit: Payer: Self-pay

## 2011-05-18 ENCOUNTER — Emergency Department (HOSPITAL_COMMUNITY)
Admission: EM | Admit: 2011-05-18 | Discharge: 2011-05-18 | Disposition: A | Discharge status: Home / Self Care | Payer: Medicare Other | Attending: Emergency Medicine | Admitting: Emergency Medicine

## 2011-05-18 DIAGNOSIS — I10 Essential (primary) hypertension: Secondary | ICD-10-CM | POA: Insufficient documentation

## 2011-05-18 DIAGNOSIS — I509 Heart failure, unspecified: Secondary | ICD-10-CM | POA: Insufficient documentation

## 2011-05-18 DIAGNOSIS — M549 Dorsalgia, unspecified: Secondary | ICD-10-CM | POA: Insufficient documentation

## 2011-05-18 DIAGNOSIS — Z8711 Personal history of peptic ulcer disease: Secondary | ICD-10-CM | POA: Insufficient documentation

## 2011-05-18 DIAGNOSIS — R0789 Other chest pain: Secondary | ICD-10-CM | POA: Insufficient documentation

## 2011-05-18 DIAGNOSIS — G8929 Other chronic pain: Secondary | ICD-10-CM | POA: Insufficient documentation

## 2011-05-18 DIAGNOSIS — Z79899 Other long term (current) drug therapy: Secondary | ICD-10-CM | POA: Insufficient documentation

## 2011-05-18 DIAGNOSIS — F10929 Alcohol use, unspecified with intoxication, unspecified: Secondary | ICD-10-CM

## 2011-05-18 DIAGNOSIS — R1110 Vomiting, unspecified: Secondary | ICD-10-CM

## 2011-05-18 DIAGNOSIS — I252 Old myocardial infarction: Secondary | ICD-10-CM | POA: Insufficient documentation

## 2011-05-18 DIAGNOSIS — E860 Dehydration: Secondary | ICD-10-CM

## 2011-05-18 DIAGNOSIS — G40909 Epilepsy, unspecified, not intractable, without status epilepticus: Secondary | ICD-10-CM | POA: Insufficient documentation

## 2011-05-18 DIAGNOSIS — F1010 Alcohol abuse, uncomplicated: Secondary | ICD-10-CM | POA: Insufficient documentation

## 2011-05-18 DIAGNOSIS — E7800 Pure hypercholesterolemia, unspecified: Secondary | ICD-10-CM | POA: Insufficient documentation

## 2011-05-18 DIAGNOSIS — R109 Unspecified abdominal pain: Secondary | ICD-10-CM | POA: Insufficient documentation

## 2011-05-18 DIAGNOSIS — Z7982 Long term (current) use of aspirin: Secondary | ICD-10-CM | POA: Insufficient documentation

## 2011-05-18 DIAGNOSIS — E876 Hypokalemia: Secondary | ICD-10-CM

## 2011-05-18 DIAGNOSIS — M129 Arthropathy, unspecified: Secondary | ICD-10-CM | POA: Insufficient documentation

## 2011-05-18 DIAGNOSIS — M25519 Pain in unspecified shoulder: Secondary | ICD-10-CM | POA: Insufficient documentation

## 2011-05-18 DIAGNOSIS — R112 Nausea with vomiting, unspecified: Secondary | ICD-10-CM | POA: Insufficient documentation

## 2011-05-18 DIAGNOSIS — K219 Gastro-esophageal reflux disease without esophagitis: Secondary | ICD-10-CM | POA: Insufficient documentation

## 2011-05-18 LAB — DIFFERENTIAL
Basophils Absolute: 0 10*3/uL (ref 0.0–0.1)
Basophils Relative: 0 % (ref 0–1)
Eosinophils Absolute: 0 10*3/uL (ref 0.0–0.7)
Neutrophils Relative %: 73 % (ref 43–77)

## 2011-05-18 LAB — COMPREHENSIVE METABOLIC PANEL
ALT: 23 U/L (ref 0–53)
Albumin: 4.3 g/dL (ref 3.5–5.2)
Alkaline Phosphatase: 73 U/L (ref 39–117)
Potassium: 2.7 mEq/L — CL (ref 3.5–5.1)
Sodium: 143 mEq/L (ref 135–145)
Total Protein: 8.2 g/dL (ref 6.0–8.3)

## 2011-05-18 LAB — RAPID URINE DRUG SCREEN, HOSP PERFORMED
Barbiturates: NOT DETECTED
Benzodiazepines: NOT DETECTED
Cocaine: NOT DETECTED
Tetrahydrocannabinol: NOT DETECTED

## 2011-05-18 LAB — CBC
MCH: 29.3 pg (ref 26.0–34.0)
MCHC: 34.9 g/dL (ref 30.0–36.0)
Platelets: 253 10*3/uL (ref 150–400)
RBC: 4.68 MIL/uL (ref 4.22–5.81)

## 2011-05-18 MED ORDER — OXYCODONE-ACETAMINOPHEN 5-325 MG PO TABS
2.0000 | ORAL_TABLET | Freq: Once | ORAL | Status: AC
  Administered 2011-05-18: 2 via ORAL
  Filled 2011-05-18: qty 2

## 2011-05-18 MED ORDER — POTASSIUM CHLORIDE 10 MEQ/100ML IV SOLN
10.0000 meq | INTRAVENOUS | Status: AC
  Administered 2011-05-18 (×2): 10 meq via INTRAVENOUS
  Filled 2011-05-18 (×2): qty 100

## 2011-05-18 MED ORDER — SODIUM CHLORIDE 0.9 % IV BOLUS (SEPSIS)
1000.0000 mL | Freq: Once | INTRAVENOUS | Status: AC
  Administered 2011-05-18: 1000 mL via INTRAVENOUS

## 2011-05-18 MED ORDER — POTASSIUM CHLORIDE 20 MEQ PO PACK: 20.0000 meq | PACK | Freq: Two times a day (BID) | ORAL | Status: DC

## 2011-05-18 MED ORDER — FAMOTIDINE IN NACL 20-0.9 MG/50ML-% IV SOLN
20.0000 mg | Freq: Once | INTRAVENOUS | Status: AC
  Administered 2011-05-18: 20 mg via INTRAVENOUS
  Filled 2011-05-18: qty 50

## 2011-05-18 MED ORDER — SODIUM CHLORIDE 0.9 % IV SOLN
INTRAVENOUS | Status: DC
  Administered 2011-05-18: 07:00:00 via INTRAVENOUS

## 2011-05-18 MED ORDER — PROMETHAZINE HCL 25 MG RE SUPP: 25.0000 mg | Freq: Four times a day (QID) | RECTAL | Status: DC | PRN

## 2011-05-18 MED ORDER — POTASSIUM CHLORIDE 20 MEQ/15ML (10%) PO LIQD
40.0000 meq | Freq: Once | ORAL | Status: AC
  Administered 2011-05-18: 40 meq via ORAL
  Filled 2011-05-18: qty 30

## 2011-05-18 MED ORDER — ONDANSETRON HCL 4 MG/2ML IJ SOLN
4.0000 mg | INTRAMUSCULAR | Status: DC | PRN
  Administered 2011-05-18: 4 mg via INTRAVENOUS
  Filled 2011-05-18: qty 2

## 2011-05-18 MED ORDER — POTASSIUM CHLORIDE CRYS ER 20 MEQ PO TBCR: 40.0000 meq | EXTENDED_RELEASE_TABLET | Freq: Once | ORAL | Status: DC

## 2011-05-18 NOTE — ED Notes (Addendum)
Pt arrived to department via EMS.  Per report, pt did have some CP beginning about 9 pm. Pt also reports he drank "a fifth and a pint" last night.  Pt denies any nausea or SOB at present time, although reports he did vomit several times last night.  Per EMS, pt was given 325 mg ASA prior to arrival

## 2011-05-18 NOTE — ED Provider Notes (Signed)
History   This chart was scribed for Ward Givens, MD by Brooks Sailors. The patient was seen in room APA14/AP[facility]14. Patient's care was started at [phone].   CSN: [id number]  Arrival date & time 05/18/11  1191   None     Chief Complaint  Patient presents with  . Chest Pain    (Consider location/radiation/quality/duration/timing/severity/associated sxs/prior treatment) HPI  Dalton [name] is a 51 y.o. male who presents to the Emergency Department complaining of constant moderate chest pain onset last evening about 6:30 pm and currently improving with associated nausea, vomiting x 4 , but no diarrhea. Patient describes the chest pain as achy and radiating to the left shoulder. Pain started after drinking brandy last night. Patient had been off alcohol for the past 10 years before episode. Was given 324mg  Asprin by EMS during travel to ER. Relates he still has nausea and his chest pain is better but still present. Nothing makes it worse or better.    Denies swelling of the lower extremities and diarrhea. H/o of CABG, HTN, high cholesterol, CHF, MI, PUD, anemia, GERD.  [facility] Cardiologist Dr Shon Baton   Past Medical History  Diagnosis Date  . Hypertension   . High cholesterol   . Congestive heart failure (CHF)   . Arthritis   . MI (myocardial infarction)     7, last one was in 2011  . PUD (peptic ulcer disease)     in 1990s, secondary to medication  . Laceration of right hand 11/27/10  . Chronic back pain     [facility] in [place]  . Chronic bronchitis   . CHF (congestive heart failure)   . Bronchitis, chronic   . Anemia   . GERD (gastroesophageal reflux disease)   . Frequency of urination   . Seizures     entire life, last seizure in 2011;unknown etiology-pt sts heriditary    Past Surgical History  Procedure Date  . Carpal tunnel release     bilateral  . Knee surgery     left-plate to left knee cap from accident  . Coronary artery bypass graft 2002      3 vessels  . Back surgery     3-  . Savory dilation 12/28/2010    Procedure: SAVORY DILATION;  Surgeon: Arlyce Harman, MD;  Location: [facility];  Service: Endoscopy;  Laterality: N/A;  dilated with 16mm    Family History  Problem Relation Age of Onset  . Diabetes Mother   . Hypertension Mother   . Hypertension Father   . Diabetes Father   . Heart attack      mother, father, brother, sister all deceased due to MI  . Colon cancer Neg Hx   . Liver disease Neg Hx   . Anesthesia problems Neg Hx   . Hypotension Neg Hx   . Malignant hyperthermia Neg Hx   . Pseudochol deficiency Neg Hx     History  Substance Use Topics  . Smoking status: Never Smoker   . Smokeless tobacco: Not on file  . Alcohol Use: Yes  disability Denies cocaine use for years    Review of Systems 10 Systems reviewed and are negative for acute change except as noted in the HPI.  Allergies  Gabapentin; Ibuprofen; Naproxen; and Zolpidem tartrate  Home Medications   Current Outpatient Rx  Name Route Sig Dispense Refill  . HYDROCHLOROTHIAZIDE 25 MG PO TABS Oral Take 25 mg by mouth daily.    [name] Kitchen  ALBUTEROL SULFATE HFA 108 (90 BASE) MCG/ACT IN AERS Inhalation Inhale 2 puffs into the lungs every 6 (six) hours as needed. Shortness of breath     . ASPIRIN 325 MG PO TBEC Oral Take 325 mg by mouth daily.      . ATORVASTATIN CALCIUM 10 MG PO TABS Oral Take 10 mg by mouth daily.      [name] Kitchen CETIRIZINE HCL 10 MG PO TABS Oral Take 10 mg by mouth daily.     [name] Kitchen CLOPIDOGREL BISULFATE 75 MG PO TABS Oral Take 75 mg by mouth daily.      . FUROSEMIDE 40 MG PO TABS Oral Take 40 mg by mouth daily.      [name] Kitchen LEVETIRACETAM 500 MG PO TABS Oral Take 500 mg by mouth every 12 (twelve) hours.      [name] Kitchen LISINOPRIL 10 MG PO TABS Oral Take 5 mg by mouth daily. Take half of a tablet    . METHOCARBAMOL 500 MG PO TABS  2 po tid for spam 30 tablet 0  . METOPROLOL TARTRATE 25 MG PO TABS Oral Take 25 mg by mouth 2 (two) times daily.      . MOMETASONE  FUROATE 50 MCG/ACT NA SUSP Nasal Place 2 sprays into the nose daily as needed. For congestion    . OMEPRAZOLE 20 MG PO CPDR  1 po 30 minutes prior to meals twice a day for 3 mos 60 capsule 11  . OXYCODONE-ACETAMINOPHEN 10-325 MG PO TABS Oral Take 1 tablet by mouth 4 (four) times daily as needed. pain    . TAMSULOSIN HCL 0.4 MG PO CAPS Oral Take 0.4 mg by mouth at bedtime.       BP 118/71  Pulse 76  Temp(Src) 97.7 F (36.5 C) (Oral)  Resp 16  Ht 5\' 9"  (1.753 m)  Wt 254 lb (115.214 kg)  BMI 37.51 kg/m2  SpO2 89%  Vital signs normal, repeat pulse ox was 100%, the 89 may be an error in documentation   Physical Exam  Nursing note and vitals reviewed. Constitutional: He is oriented to person, place, and time. He appears well-developed and well-nourished. No distress.        Alcohol on breath, speaks in a whisper, hard to understand  HENT:  Head: Normocephalic and atraumatic.  Right Ear: External ear normal.  Left Ear: External ear normal.  Nose: Nose normal.       Tongue dry with dry cracked lips  Eyes: Conjunctivae and EOM are normal. Pupils are equal, round, and reactive to light.  Neck: Normal range of motion. Neck supple. No tracheal deviation present.  Cardiovascular: Normal rate, regular rhythm, normal heart sounds and intact distal pulses.   Pulmonary/Chest: Effort normal and breath sounds normal. No respiratory distress.       Tenderness in left upper lateral chest   Abdominal: Soft. Bowel sounds are normal. He exhibits no distension and no mass. There is tenderness. There is no rebound and no guarding.       Mild diffuse abdominal tenderness  Musculoskeletal: Normal range of motion. He exhibits no edema.  Neurological: He is alert and oriented to person, place, and time. No sensory deficit.  Skin: Skin is warm and dry.  Psychiatric: His behavior is normal.       Flat affect    ED Course  Procedures (including critical care time)   Medications  0.9 %  sodium chloride  infusion (  Intravenous New Bag/Given 05/18/11 0723)  ondansetron (ZOFRAN) injection 4 mg (  4 mg Intravenous Given 05/18/11 0824)  sodium chloride 0.9 % bolus 1,000 mL (1000 mL Intravenous Given 05/18/11 0723)  famotidine (PEPCID) IVPB 20 mg (0 mg Intravenous Stopped 05/18/11 0901)  potassium chloride 10 mEq in 100 mL IVPB (10 mEq Intravenous New Bag/Given 05/18/11 0918) two runs  potassium chloride 20 MEQ/15ML (10%) liquid 40 mEq (40 mEq Oral Given 05/18/11 0824)  oxyCODONE-acetaminophen (PERCOCET) 5-325 MG per tablet 2 tablet (2 tablet Oral Given 05/18/11 0918)    DIAGNOSTIC STUDIES: COORDINATION OF CARE:   7:00 AM Patient informed of current plan for treatment and evaluation and agrees with plan at this time.  8:48 AM-  Informed patient of low potassium. Patient is starting to sip liquids.  10:30 AM Informed patient of labs and imaging. Pt drinking fluids and is feeling better. Feels ready to go home.   Results for orders placed during the hospital encounter of 05/18/11  CBC      Component Value Range   WBC 10.0  4.0 - 10.5 (K/uL)   RBC 4.68  4.22 - 5.81 (MIL/uL)   Hemoglobin 13.7  13.0 - 17.0 (g/dL)   HCT 64.3  32.9 - 51.8 (%)   MCV 84.0  78.0 - 100.0 (fL)   MCH 29.3  26.0 - 34.0 (pg)   MCHC 34.9  30.0 - 36.0 (g/dL)   RDW 84.1  66.0 - 63.0 (%)   Platelets 253  150 - 400 (K/uL)  DIFFERENTIAL      Component Value Range   Neutrophils Relative 73  43 - 77 (%)   Neutro Abs 7.4  1.7 - 7.7 (K/uL)   Lymphocytes Relative 21  12 - 46 (%)   Lymphs Abs 2.1  0.7 - 4.0 (K/uL)   Monocytes Relative 5  3 - 12 (%)   Monocytes Absolute 0.5  0.1 - 1.0 (K/uL)   Eosinophils Relative 0  0 - 5 (%)   Eosinophils Absolute 0.0  0.0 - 0.7 (K/uL)   Basophils Relative 0  0 - 1 (%)   Basophils Absolute 0.0  0.0 - 0.1 (K/uL)  COMPREHENSIVE METABOLIC PANEL      Component Value Range   Sodium 143  135 - 145 (mEq/L)   Potassium 2.7 (*) 3.5 - 5.1 (mEq/L)   Chloride 99  96 - 112 (mEq/L)   CO2 23  19 - 32  (mEq/L)   Glucose, Bld 95  70 - 99 (mg/dL)   BUN 12  6 - 23 (mg/dL)   Creatinine, Ser 1.60  0.50 - 1.35 (mg/dL)   Calcium 9.9  8.4 - 10.9 (mg/dL)   Total Protein 8.2  6.0 - 8.3 (g/dL)   Albumin 4.3  3.5 - 5.2 (g/dL)   AST 20  0 - 37 (U/L)   ALT 23  0 - 53 (U/L)   Alkaline Phosphatase 73  39 - 117 (U/L)   Total Bilirubin 1.1  0.3 - 1.2 (mg/dL)   GFR calc non Af Amer 65 (*) >90 (mL/min)   GFR calc Af Amer 75 (*) >90 (mL/min)  LIPASE, BLOOD      Component Value Range   Lipase 37  11 - 59 (U/L)  ETHANOL      Component Value Range   Alcohol, Ethyl (B) 138 (*) 0 - 11 (mg/dL)  URINE RAPID DRUG SCREEN (HOSP PERFORMED)      Component Value Range   Opiates NONE DETECTED  NONE DETECTED    Cocaine NONE DETECTED  NONE DETECTED    Benzodiazepines NONE  DETECTED  NONE DETECTED    Amphetamines NONE DETECTED  NONE DETECTED    Tetrahydrocannabinol NONE DETECTED  NONE DETECTED    Barbiturates NONE DETECTED  NONE DETECTED    Laboratory interpretation all normal except hypokalemia, all intoxication   Dg Chest Port 1 View  05/18/2011  *RADIOLOGY REPORT*  Clinical Data: Chest pain, shortness of breath  PORTABLE CHEST - 1 VIEW  Comparison: 04/17/2010  Findings: Lungs are essentially clear.  Mild right perihilar opacity is presumed to reflect pulmonary vascular congestion.  No pleural effusion or pneumothorax.  The heart is normal in size.  Postsurgical changes related to prior CABG.  Vascular stent overlying the left upper mediastinum.  IMPRESSION: No evidence of acute cardiopulmonary disease.  Original Report Authenticated By: Charline Bills, M.D.     Date: 05/18/2011  Rate: 69  Rhythm: normal sinus rhythm  QRS Axis: normal  Intervals: normal  ST/T Wave abnormalities: nonspecific ST changes  Conduction Disutrbances:none  Narrative Interpretation:   Old EKG Reviewed: none available    1. Dehydration   2. Vomiting   3. Chest pain, atypical   4. Hypokalemia   5. Alcohol intoxication      New Prescriptions   POTASSIUM CHLORIDE (KLOR-CON) 20 MEQ PACKET    Take 20 mEq by mouth 2 (two) times daily.   PROMETHAZINE (PHENERGAN) 25 MG SUPPOSITORY    Place 1 suppository (25 mg total) rectally every 6 (six) hours as needed for nausea.  Omeptrazole twice a day for 2 weeks   Plan discharge Devoria Albe, MD, FACEP   MDM   I personally performed the services described in this documentation, which was scribed in my presence. The recorded information has been reviewed and considered. Devoria Albe, MD, FACEP         Ward Givens, MD 05/18/11 1049

## 2011-05-18 NOTE — ED Notes (Signed)
CRITICAL VALUE ALERT  Critical value received: Potassium 2.7  Date of notification: 05/18/2011  Time of notification:  0758  Critical value read back:yes  Nurse who received alert: L. Elmer Picker RN  MD notified (1st page):  Dr. Lynelle Doctor  Time of first page:  [phone] MD notified (2nd page):  Time of second page:  Responding MD: Dr. Lynelle Doctor  Time MD responded:  [phone]

## 2011-05-18 NOTE — Discharge Instructions (Signed)
Drink plenty of fluids (clear liquids) the next 12-24 hours then start the BRAT diet. . Use the phenergan for nausea or vomiting. Take the potassium until gone. Take your prilosec twice a day for the next 2 weeks. Keep your appointment next week with Dr Francesco Runner.  Recheck if you get worse.

## 2011-07-27 ENCOUNTER — Encounter (HOSPITAL_COMMUNITY): Admission: RE | Payer: Self-pay | Source: Ambulatory Visit

## 2011-07-27 ENCOUNTER — Ambulatory Visit (HOSPITAL_COMMUNITY): Admission: RE | Admit: 2011-07-27 | Payer: Medicare Other | Source: Ambulatory Visit | Admitting: Cardiology

## 2011-07-27 SURGERY — LEFT HEART CATHETERIZATION WITH CORONARY ANGIOGRAM
Anesthesia: LOCAL

## 2011-08-31 ENCOUNTER — Ambulatory Visit (HOSPITAL_COMMUNITY)
Admission: RE | Admit: 2011-08-31 | Discharge: 2011-08-31 | Disposition: A | Discharge status: Home / Self Care | Payer: Medicare Other | Source: Ambulatory Visit | Attending: Cardiology | Admitting: Cardiology

## 2011-08-31 ENCOUNTER — Encounter (HOSPITAL_COMMUNITY)
Admission: RE | Disposition: A | Discharge status: Home / Self Care | Payer: Self-pay | Source: Ambulatory Visit | Attending: Cardiology

## 2011-08-31 ENCOUNTER — Encounter (HOSPITAL_COMMUNITY): Payer: Self-pay | Admitting: Cardiology

## 2011-08-31 DIAGNOSIS — R0989 Other specified symptoms and signs involving the circulatory and respiratory systems: Secondary | ICD-10-CM | POA: Insufficient documentation

## 2011-08-31 DIAGNOSIS — E7800 Pure hypercholesterolemia, unspecified: Secondary | ICD-10-CM | POA: Diagnosis present

## 2011-08-31 DIAGNOSIS — I739 Peripheral vascular disease, unspecified: Secondary | ICD-10-CM | POA: Diagnosis present

## 2011-08-31 DIAGNOSIS — I10 Essential (primary) hypertension: Secondary | ICD-10-CM | POA: Diagnosis present

## 2011-08-31 DIAGNOSIS — R0609 Other forms of dyspnea: Secondary | ICD-10-CM | POA: Insufficient documentation

## 2011-08-31 DIAGNOSIS — Z9861 Coronary angioplasty status: Secondary | ICD-10-CM | POA: Insufficient documentation

## 2011-08-31 DIAGNOSIS — I2510 Atherosclerotic heart disease of native coronary artery without angina pectoris: Secondary | ICD-10-CM | POA: Diagnosis present

## 2011-08-31 DIAGNOSIS — Z951 Presence of aortocoronary bypass graft: Secondary | ICD-10-CM

## 2011-08-31 DIAGNOSIS — I252 Old myocardial infarction: Secondary | ICD-10-CM | POA: Insufficient documentation

## 2011-08-31 DIAGNOSIS — R079 Chest pain, unspecified: Secondary | ICD-10-CM | POA: Insufficient documentation

## 2011-08-31 DIAGNOSIS — I509 Heart failure, unspecified: Secondary | ICD-10-CM | POA: Insufficient documentation

## 2011-08-31 HISTORY — PX: LEFT HEART CATHETERIZATION WITH CORONARY ANGIOGRAM: SHX5451

## 2011-08-31 LAB — CBC
Hemoglobin: 13 g/dL (ref 13.0–17.0)
MCH: 30 pg (ref 26.0–34.0)
MCV: 85.7 fL (ref 78.0–100.0)
Platelets: 186 10*3/uL (ref 150–400)
RBC: 4.33 MIL/uL (ref 4.22–5.81)
WBC: 5.5 10*3/uL (ref 4.0–10.5)

## 2011-08-31 LAB — BASIC METABOLIC PANEL
CO2: 31 mEq/L (ref 19–32)
Calcium: 9.3 mg/dL (ref 8.4–10.5)
Chloride: 101 mEq/L (ref 96–112)
Creatinine, Ser: 1 mg/dL (ref 0.50–1.35)
Glucose, Bld: 97 mg/dL (ref 70–99)
Sodium: 140 mEq/L (ref 135–145)

## 2011-08-31 LAB — PROTIME-INR
INR: 0.97 (ref 0.00–1.49)
Prothrombin Time: 13.1 seconds (ref 11.6–15.2)

## 2011-08-31 SURGERY — LEFT HEART CATHETERIZATION WITH CORONARY ANGIOGRAM
Anesthesia: LOCAL

## 2011-08-31 MED ORDER — MIDAZOLAM HCL 2 MG/2ML IJ SOLN
INTRAMUSCULAR | Status: AC
  Filled 2011-08-31: qty 2

## 2011-08-31 MED ORDER — SODIUM CHLORIDE 0.9 % IV SOLN
INTRAVENOUS | Status: DC
  Administered 2011-08-31: 06:00:00 via INTRAVENOUS

## 2011-08-31 MED ORDER — SODIUM CHLORIDE 0.9 % IV SOLN: INTRAVENOUS | Status: AC

## 2011-08-31 MED ORDER — ONDANSETRON HCL 4 MG/2ML IJ SOLN: 4.0000 mg | Freq: Four times a day (QID) | INTRAMUSCULAR | Status: DC | PRN

## 2011-08-31 MED ORDER — SODIUM CHLORIDE 0.9 % IV SOLN: 250.0000 mL | INTRAVENOUS | Status: DC | PRN

## 2011-08-31 MED ORDER — LIDOCAINE HCL (PF) 1 % IJ SOLN
INTRAMUSCULAR | Status: AC
  Filled 2011-08-31: qty 30

## 2011-08-31 MED ORDER — SODIUM CHLORIDE 0.9 % IJ SOLN: 3.0000 mL | Freq: Two times a day (BID) | INTRAMUSCULAR | Status: DC

## 2011-08-31 MED ORDER — ASPIRIN 81 MG PO CHEW
324.0000 mg | CHEWABLE_TABLET | ORAL | Status: AC
  Administered 2011-08-31: 324 mg via ORAL
  Filled 2011-08-31: qty 4

## 2011-08-31 MED ORDER — FENTANYL CITRATE 0.05 MG/ML IJ SOLN
INTRAMUSCULAR | Status: AC
  Filled 2011-08-31: qty 2

## 2011-08-31 MED ORDER — NITROGLYCERIN 0.2 MG/ML ON CALL CATH LAB
INTRAVENOUS | Status: AC
  Filled 2011-08-31: qty 1

## 2011-08-31 MED ORDER — HEPARIN (PORCINE) IN NACL 2-0.9 UNIT/ML-% IJ SOLN
INTRAMUSCULAR | Status: AC
  Filled 2011-08-31: qty 2000

## 2011-08-31 MED ORDER — VERAPAMIL HCL 2.5 MG/ML IV SOLN
INTRAVENOUS | Status: AC
  Filled 2011-08-31: qty 2

## 2011-08-31 MED ORDER — HYDROMORPHONE HCL PF 2 MG/ML IJ SOLN
INTRAMUSCULAR | Status: AC
  Filled 2011-08-31: qty 1

## 2011-08-31 MED ORDER — ACETAMINOPHEN 325 MG PO TABS: 650.0000 mg | ORAL_TABLET | ORAL | Status: DC | PRN

## 2011-08-31 MED ORDER — SODIUM CHLORIDE 0.9 % IJ SOLN: 3.0000 mL | INTRAMUSCULAR | Status: DC | PRN

## 2011-08-31 NOTE — Interval H&P Note (Signed)
History and Physical Interval Note:  08/31/2011 7:48 AM  Dalton [name]  has presented today for surgery, with the diagnosis of Chest pain  The various methods of treatment have been discussed with the patient and family. After consideration of risks, benefits and other options for treatment, the patient has consented to  Procedure(s) (LRB): LEFT HEART CATHETERIZATION WITH CORONARY ANGIOGRAM (N/A) and possible angioplasty as a surgical intervention .  The patient's history has been reviewed, patient examined, no change in status, stable for surgery.  I have reviewed the patients' chart and labs.  Questions were answered to the patient's satisfaction.     [name]

## 2011-08-31 NOTE — CV Procedure (Signed)
Procedures performed: Femoral access Left heart catheterization including left ventriculography, selective right and left coronary arteriography. Left subclavian arteriogram Right femoral arteriogram and closure of the right femoral arterial access with Perclose.  Indication: Chest pain , dyspnea , HTN and PAD with H/O Left subclavian stent in the remote past.  HEMODYNAMIC DATA: The left ventricle pressure was 130/3 with  EDP19. Aortic pressure was  123/73 with a mean of 96 mmHg.  There was no pressure gradient across the aortic valve   Right coronary artery: Right coronary is a large caliber vessel and a  dominant vessel. It is tortuous. It is smooth and normal.   Left main coronary artery: Left main coronary artery is a large caliber  vessel, which is smooth and normal.   Circumflex: Circumflex is a moderate caliber vessel, giving origin to a  small obtuse marginal 1. Smooth and normal.   Ramus intermediate: NA.   LAD: LAD is a large caliber vessel, giving origin to several small  diagonals. It is smooth and normal.   Left ventriculogram: Left ventriculography revealed ejection fraction  of 55-60%. There was no wall motion abnormality.   Left subclavian arteriogram: The left subdural artery stent is widely patent. Left internal mammary artery is atretic.  IMPRESSIONS:  1. Normal coronary arteries, right dominant circulation. Patient states that he's had coronary stent in the past. I suspect this is probably a subclavian stent. LIMA to LAD is atretic. There is no significant coronary artery disease. LVEF: Normal 2. Patent left subclavian artery stent.  TECHNICAL PROCEDURE: Under sterile precautions using a 6-French right femoral access, a 6 [place] multipurpose B2 catheter was advanced via right femoral arterial access. The catheter was advanced into the left ventricle and left ventriculography was performed in the RAO projection. Prior to performing left ventriculogram, the left  subdural artery was selectively cannulated and angiography was performed.  The catheter was then pulled out of the body. A 5 French Judkins left 4 and a 6 French Judkins right 4 diagnostic catheters were utilized to engage left and right coronary arteries respectively and angiography was performed. The catheter was then pulled out of the body over J-wire.  Right femoral arteriogram was performed to evaluate the arterial access and arterial access was closed with Perclose with good hemostasis. Because of persistent ooze manual pressure for 10 minutes was held. There was no hematoma post procedure.  Right radial access was initially obtained, but because of inability to cross the radial artery to place the sheath due to significant spasm, femoral access obtained.  No immediate complications.

## 2011-08-31 NOTE — H&P (Signed)
Dalton [name] is an 51 y.o. male.   Chief Complaint: Chest pain  HOPI:  Patient presents for f/u ofchest pain. Chest pain started 2-3 month ago.  It occurs with exertion or when stressed.  Chest discomfort lasts 1-3 hours.  It is described as sharp. It is located in the left chest region. It does radiates to the left arm and neck.  Chest pain is present with activity and also with sleeping certain ways.  Chest discomfort is  associated with dyspnea, nausea, vomiting, diaphoresis, palpitations,  dizziness, occasional near syncope.   There is a nocternal component.  Currently having 1-3 episodes a week.  Feels similar to his heart attack pain, but does feel similar to way chest pain symptoms began years ago.  Last time he saw a cardiologist was 1 1/2 years ago, his son is  Dalton [name], [name]. who is my patient. Has had prior stenting some years ago in [place]. CABG 2003 x 1 vessels at [facility].    Past Medical History  Diagnosis Date  . Hypertension   . High cholesterol   . Congestive heart failure (CHF)   . Arthritis   . MI (myocardial infarction)     7, last one was in 2011  . PUD (peptic ulcer disease)     in 1990s, secondary to medication  . Laceration of right hand 11/27/10  . Chronic back pain     Pain Clinic in [place]  . Chronic bronchitis   . CHF (congestive heart failure)   . Bronchitis, chronic   . Anemia   . GERD (gastroesophageal reflux disease)   . Frequency of urination   . Seizures     entire life, last seizure in 2011;unknown etiology-pt sts heriditary    Past Surgical History  Procedure Date  . Carpal tunnel release     bilateral  . Knee surgery     left-plate to left knee cap from accident  . Coronary artery bypass graft 2002    3 vessels  . Back surgery     3-  . Savory dilation 12/28/2010    Procedure: SAVORY DILATION;  Surgeon: Arlyce Harman, MD;  Location: [facility];  Service: Endoscopy;  Laterality: N/A;  dilated with 16mm    Family  History  Problem Relation Age of Onset  . Diabetes Mother   . Hypertension Mother   . Hypertension Father   . Diabetes Father   . Heart attack      mother, father, brother, sister all deceased due to MI  . Colon cancer Neg Hx   . Liver disease Neg Hx   . Anesthesia problems Neg Hx   . Hypotension Neg Hx   . Malignant hyperthermia Neg Hx   . Pseudochol deficiency Neg Hx    Social History:  reports that he has never smoked. He does not have any smokeless tobacco history on file. He reports that he drinks alcohol. He reports that he does not use illicit drugs.  Allergies:  Allergies  Allergen Reactions  . Gabapentin Hives  . Ibuprofen Other (See Comments)    Stomach upset  . Naproxen     unknown  . Zolpidem Tartrate     Hallucinations     Medications Prior to Admission  Medication Sig Dispense Refill  . albuterol (PROVENTIL HFA;VENTOLIN HFA) 108 (90 BASE) MCG/ACT inhaler Inhale 2 puffs into the lungs every 6 (six) hours as needed. Shortness of breath       . alprazolam Prudy Feeler)  2 MG tablet Take 2 mg by mouth Twice daily.      . ANDROGEL PUMP 1.25 GM/ACT (1%) GEL Apply 1 application topically daily.      [name] Kitchen aspirin 325 MG EC tablet Take 325 mg by mouth daily.        [name] Kitchen atorvastatin (LIPITOR) 10 MG tablet Take 10 mg by mouth daily.        . cetirizine (ZYRTEC) 10 MG tablet Take 10 mg by mouth daily.       . clopidogrel (PLAVIX) 75 MG tablet Take 75 mg by mouth daily.        . furosemide (LASIX) 40 MG tablet Take 40 mg by mouth daily.        . hydrochlorothiazide (HYDRODIURIL) 25 MG tablet Take 25 mg by mouth daily.      [name] Kitchen levETIRAcetam (KEPPRA) 500 MG tablet Take 500 mg by mouth every 12 (twelve) hours.        [name] Kitchen lisinopril (PRINIVIL,ZESTRIL) 10 MG tablet Take 5 mg by mouth daily. Take half of a tablet      . metoprolol tartrate (LOPRESSOR) 25 MG tablet Take 25 mg by mouth 2 (two) times daily.        . mometasone (NASONEX) 50 MCG/ACT nasal spray Place 2 sprays into the nose daily  as needed. For congestion      . omeprazole (PRILOSEC) 20 MG capsule 1 po 30 minutes prior to meals twice a day for 3 mos  60 capsule  11  . oxyCODONE-acetaminophen (PERCOCET) 10-325 MG per tablet Take 1 tablet by mouth 4 (four) times daily as needed. pain      . potassium chloride (KLOR-CON) 20 MEQ packet Take 20 mEq by mouth 2 (two) times daily.  8 packet  0  . sertraline (ZOLOFT) 100 MG tablet Take 100 mg by mouth Every morning.      . Tamsulosin HCl (FLOMAX) 0.4 MG CAPS Take 0.4 mg by mouth at bedtime.       . methocarbamol (ROBAXIN) 500 MG tablet 2 po tid for spam  30 tablet  0  . SUMAtriptan (IMITREX) 50 MG tablet Take 50 mg by mouth daily as needed. migraine        Results for orders placed during the hospital encounter of 08/31/11 (from the past 48 hour(s))  CBC     Status: Abnormal   Collection Time   08/31/11  6:22 AM      Component Value Range Comment   WBC 5.5  4.0 - 10.5 K/uL    RBC 4.33  4.22 - 5.81 MIL/uL    Hemoglobin 13.0  13.0 - 17.0 g/dL    HCT 16.1 (*) 09.6 - 52.0 %    MCV 85.7  78.0 - 100.0 fL    MCH 30.0  26.0 - 34.0 pg    MCHC 35.0  30.0 - 36.0 g/dL    RDW 04.5  40.9 - 81.1 %    Platelets 186  150 - 400 K/uL   BASIC METABOLIC PANEL     Status: Abnormal   Collection Time   08/31/11  6:22 AM      Component Value Range Comment   Sodium 140  135 - 145 mEq/L    Potassium 3.3 (*) 3.5 - 5.1 mEq/L    Chloride 101  96 - 112 mEq/L    CO2 31  19 - 32 mEq/L    Glucose, Bld 97  70 - 99 mg/dL    BUN 7  6 -  23 mg/dL    Creatinine, Ser 1.61  0.50 - 1.35 mg/dL    Calcium 9.3  8.4 - 09.6 mg/dL    GFR calc non Af Amer 86 (*) >90 mL/min    GFR calc Af Amer >90  >90 mL/min   PROTIME-INR     Status: Normal   Collection Time   08/31/11  6:22 AM      Component Value Range Comment   Prothrombin Time 13.1  11.6 - 15.2 seconds    INR 0.97  0.00 - 1.49     ROS: Arthritis-yes, legs and hands, life-style limiting-somewhat;  Cramping of the legs and feet at night or with activity-yes,  several times a month; Diabetes-no;  Hypothyroidism-no;  Previous Stroke-no, Previous GI Bleed-h/o stomach ulcers in 2005 ,  Recent weight change-no, but he's trying to lose weight, Symptoms to suggest sleep apnea like loud snoring, daytime sleepiness-negative for sleep apnea in 2012; doesn't sleep well at night; was told he snores sometimes, Other systems negative.    Blood pressure 134/83, pulse 53, temperature 97.1 F (36.2 C), temperature source Oral, resp. rate 18, height 5\' 10"  (1.778 m), weight 110.678 kg (244 lb), SpO2 99.00%.  GENERAL: Moderately built and obese  body habitus.  Alert and oriented  x 3,  in no acute distress.   Appears stated age.  There is no cyanosis.    HEENT: normal limits.    NECK:  Supple.  No JVD noted.    CARDIAC EXAM: S1 S2 normal, no gallop or murmur.    CHEST EXAM: No tenderness of chest wall.  No obvious deformity.  Healed sternal thoracotomy scar.  ABDOMEN: No hepatosplenomegaly.  BS normal in all 4 quadrants. Abdomen is non-tender.  Obese.  MUSCULOSKELETAL EXAM: Intact with full range of motion in all 4 extremities.    NEUROLOGIC EXAM: Grossly intact without any focal deficits. Alert O x 3.     VASCULAR EXAM: Carotids  are normal without bruit.  Radial, femoral, popliteal, and pedal pulse normal and without bruit.  No prominent pulse felt in the abdomen.  No renal bruits auscultated.  No varicose veins.  No skin breakdown.  Assessment/Plan 1. Atypical chest pain cannot completely exclude angina pectoris.   ECG 07/09/11 SB@56 , normal intervals, no ischemia. 2. CAD/ASHD  H/O Heart cath 10/24/08: LIMA (CABG LIMA to LAD in 2007) occluded. Ostial LAD 30%, Mid LAD bridging, mild disease. Apical hypo. EF 55: No angina, dyspnea or CHF on exam. He also states he has a coronary stent and MI in [place] late in 2010.  3. Hyperlipidemia.   4. Obesity moderate,  BMI 35. No obstructive sleep apnea symptoms . Negative sleep study in the past. 5. Polysubstance  abuse in the past clean for the last 3 years. Now compliant with medical care. 6. Hypertension at goal.  Plan:  Patient symptoms are very similar to his prior angina and they're getting worse over the last one month.  Patient also states that he's been clean from use of cocaine and crack.  Last use was greater than 3 years ago.  Schedule for cardiac catheterization. We discussed regarding risks, benefits, alternatives to this including stress testing, CTA and continued medical therapy. Patient wants to proceed. Understands <1-2% risk of death, stroke, MI, urgent CABG, bleeding, infection, renal failure but not limited to these.     Patient is in pain clinic, and his urine is always tested for any illicit use of drugs.  He has been clean and has  not used it for the last greater than 3 years.  [name], MD 08/31/2011, 7:42 AM

## 2011-09-25 ENCOUNTER — Emergency Department (HOSPITAL_COMMUNITY): Payer: Medicare Other

## 2011-09-25 ENCOUNTER — Emergency Department (HOSPITAL_COMMUNITY)
Admission: EM | Admit: 2011-09-25 | Discharge: 2011-09-25 | Disposition: A | Discharge status: Home / Self Care | Payer: Medicare Other | Attending: Emergency Medicine | Admitting: Emergency Medicine

## 2011-09-25 ENCOUNTER — Encounter (HOSPITAL_COMMUNITY): Payer: Self-pay | Admitting: *Deleted

## 2011-09-25 DIAGNOSIS — Z951 Presence of aortocoronary bypass graft: Secondary | ICD-10-CM | POA: Insufficient documentation

## 2011-09-25 DIAGNOSIS — Z79899 Other long term (current) drug therapy: Secondary | ICD-10-CM | POA: Insufficient documentation

## 2011-09-25 DIAGNOSIS — K219 Gastro-esophageal reflux disease without esophagitis: Secondary | ICD-10-CM | POA: Insufficient documentation

## 2011-09-25 DIAGNOSIS — M129 Arthropathy, unspecified: Secondary | ICD-10-CM | POA: Insufficient documentation

## 2011-09-25 DIAGNOSIS — R350 Frequency of micturition: Secondary | ICD-10-CM | POA: Insufficient documentation

## 2011-09-25 DIAGNOSIS — E785 Hyperlipidemia, unspecified: Secondary | ICD-10-CM | POA: Insufficient documentation

## 2011-09-25 DIAGNOSIS — I10 Essential (primary) hypertension: Secondary | ICD-10-CM | POA: Insufficient documentation

## 2011-09-25 DIAGNOSIS — I509 Heart failure, unspecified: Secondary | ICD-10-CM | POA: Insufficient documentation

## 2011-09-25 DIAGNOSIS — J020 Streptococcal pharyngitis: Secondary | ICD-10-CM | POA: Insufficient documentation

## 2011-09-25 LAB — URINALYSIS, ROUTINE W REFLEX MICROSCOPIC
Bilirubin Urine: NEGATIVE
Glucose, UA: NEGATIVE mg/dL
Hgb urine dipstick: NEGATIVE
Ketones, ur: NEGATIVE mg/dL
Leukocytes, UA: NEGATIVE
pH: 6.5 (ref 5.0–8.0)

## 2011-09-25 MED ORDER — PENICILLIN G BENZATHINE 1200000 UNIT/2ML IM SUSP
1.2000 10*6.[IU] | Freq: Once | INTRAMUSCULAR | Status: AC
  Administered 2011-09-25: 1.2 10*6.[IU] via INTRAMUSCULAR
  Filled 2011-09-25: qty 2

## 2011-09-25 MED ORDER — ACETAMINOPHEN 500 MG PO TABS
1000.0000 mg | ORAL_TABLET | Freq: Once | ORAL | Status: AC
  Administered 2011-09-25: 1000 mg via ORAL
  Filled 2011-09-25: qty 2

## 2011-09-25 NOTE — ED Provider Notes (Signed)
Medical screening examination/treatment/procedure(s) were performed by non-physician practitioner and as supervising physician I was immediately available for consultation/collaboration.  Iker Nuttall R. Flavius Repsher, MD 09/25/11 1811 

## 2011-09-25 NOTE — ED Notes (Signed)
Pt c/o weakness and aching all over for a couple days. Pt states that he passed out this am x 2. Pt alert and oriented x 3.

## 2011-09-25 NOTE — ED Notes (Signed)
Lab has not drawn blood yet and patient states that he doesn't want to wait. States that he has an appointment with his PCP on Tuesday and will get his potassium checked. Leona Singleton, PA notified and order cancelled.

## 2011-09-25 NOTE — ED Notes (Signed)
Pt c/o aching all over, sore throat, fever and feeling weak for a couple days. Pt states that he passed out x 2 this am. Alert and oriented x 3. Skin warm and dry. Color pink. Pt showing NSR on cardiac monitor.

## 2011-09-25 NOTE — ED Provider Notes (Signed)
History     CSN: [id number]  Arrival date & time 09/25/11  1431   First MD Initiated Contact with Patient 09/25/11 1448      Chief Complaint  Patient presents with  . Fatigue    (Consider location/radiation/quality/duration/timing/severity/associated sxs/prior treatment) HPI Comments: Dalton [name] presents with a 2 day history of generalized fatigue, aching all over and sore throat.  He reports feeling lightheaded when he stands too quickly and actually passed out twice late last night when standing up. He denies chest pain and shortness of breath,  But has had a nonproductive cough.  His throat is very sore,  Worse with attempts to swallow.  He has taken nothing for his throat pain.  He has tried to maintain increased fluid intake.  He denies nausea, vomiting and diarrhea, his appetite has been somewhat reduced.  He does report painful urination,  But denies hematuria and increased frequency of urination.  The history is provided by the patient.    Past Medical History  Diagnosis Date  . Hypertension   . High cholesterol   . Congestive heart failure (CHF)   . Arthritis   . MI (myocardial infarction)     7, last one was in 2011  . PUD (peptic ulcer disease)     in 1990s, secondary to medication  . Laceration of right hand 11/27/10  . Chronic back pain     Pain Clinic in [place]  . Chronic bronchitis   . CHF (congestive heart failure)   . Bronchitis, chronic   . Anemia   . GERD (gastroesophageal reflux disease)   . Frequency of urination   . Seizures     entire life, last seizure in 2011;unknown etiology-pt sts heriditary    Past Surgical History  Procedure Date  . Carpal tunnel release     bilateral  . Knee surgery     left-plate to left knee cap from accident  . Coronary artery bypass graft 2002    3 vessels  . Back surgery     3-  . Savory dilation 12/28/2010    Procedure: SAVORY DILATION;  Surgeon: Arlyce Harman, MD;  Location: [facility];  Service:  Endoscopy;  Laterality: N/A;  dilated with 16mm    Family History  Problem Relation Age of Onset  . Diabetes Mother   . Hypertension Mother   . Hypertension Father   . Diabetes Father   . Heart attack      mother, father, brother, sister all deceased due to MI  . Colon cancer Neg Hx   . Liver disease Neg Hx   . Anesthesia problems Neg Hx   . Hypotension Neg Hx   . Malignant hyperthermia Neg Hx   . Pseudochol deficiency Neg Hx     History  Substance Use Topics  . Smoking status: Never Smoker   . Smokeless tobacco: Not on file  . Alcohol Use: No      Review of Systems  Constitutional: Positive for appetite change and fatigue. Negative for fever and diaphoresis.  HENT: Positive for sore throat. Negative for congestion, trouble swallowing, neck pain and neck stiffness.   Eyes: Negative.   Respiratory: Positive for cough. Negative for chest tightness and shortness of breath.   Cardiovascular: Negative for chest pain.  Gastrointestinal: Negative for nausea, vomiting, abdominal pain, diarrhea and constipation.  Genitourinary: Positive for dysuria. Negative for frequency, hematuria, flank pain and decreased urine volume.  Musculoskeletal: Positive for myalgias. Negative for joint swelling and arthralgias.  Skin: Negative.  Negative for rash and wound.  Neurological: Negative for dizziness, weakness, light-headedness, numbness and headaches.  Hematological: Negative.   Psychiatric/Behavioral: Negative.     Allergies  Gabapentin; Ibuprofen; Naproxen; and Zolpidem tartrate  Home Medications   Current Outpatient Rx  Name Route Sig Dispense Refill  . ALBUTEROL SULFATE HFA 108 (90 BASE) MCG/ACT IN AERS Inhalation Inhale 2 puffs into the lungs every 6 (six) hours as needed. Shortness of breath     . ALPRAZOLAM 2 MG PO TABS Oral Take 2 mg by mouth Twice daily.    . ANDROGEL PUMP 1.25 GM/ACT (1%) TD GEL Topical Apply 4 application topically daily. *4 pumps applied to either  shoulder daily*      has blood work done every month  . ASPIRIN 325 MG PO TBEC Oral Take 325 mg by mouth daily.      . ATORVASTATIN CALCIUM 10 MG PO TABS Oral Take 10 mg by mouth daily.      [name] Kitchen CETIRIZINE HCL 10 MG PO TABS Oral Take 10 mg by mouth daily as needed.     . FUROSEMIDE 40 MG PO TABS Oral Take 40 mg by mouth daily.      [name] Kitchen HYDROCHLOROTHIAZIDE 25 MG PO TABS Oral Take 25 mg by mouth daily.    [name] Kitchen LEVETIRACETAM 500 MG PO TABS Oral Take 500 mg by mouth every 12 (twelve) hours.      [name] Kitchen LISINOPRIL 10 MG PO TABS Oral Take 5 mg by mouth daily. Take half of a tablet    . METOPROLOL TARTRATE 25 MG PO TABS Oral Take 25 mg by mouth 2 (two) times daily.      . MOMETASONE FUROATE 50 MCG/ACT NA SUSP Nasal Place 2 sprays into the nose daily as needed. For congestion    . OMEPRAZOLE 20 MG PO CPDR  1 po 30 minutes prior to meals twice a day for 3 mos 60 capsule 11  . OXYCODONE-ACETAMINOPHEN 10-325 MG PO TABS Oral Take 1 tablet by mouth 4 (four) times daily as needed. pain    . POTASSIUM CHLORIDE 20 MEQ PO PACK Oral Take 20 mEq by mouth 2 (two) times daily. 8 packet 0  . SERTRALINE HCL 100 MG PO TABS Oral Take 100 mg by mouth Every morning.    [name] Kitchen TAMSULOSIN HCL 0.4 MG PO CAPS Oral Take 0.4 mg by mouth at bedtime.       BP 154/95  Pulse 92  Temp 101.4 F (38.6 C) (Oral)  Resp 18  Ht 5\' 10"  (1.778 m)  Wt 242 lb (109.77 kg)  BMI 34.72 kg/m2  SpO2 98%  Physical Exam  Nursing note and vitals reviewed. Constitutional: He appears well-developed and well-nourished.  HENT:  Head: Normocephalic and atraumatic.  Right Ear: Tympanic membrane and external ear normal.  Left Ear: Tympanic membrane normal.  Nose: Nose normal.  Mouth/Throat: Posterior oropharyngeal erythema present. No oropharyngeal exudate, posterior oropharyngeal edema or tonsillar abscesses.  Eyes: Conjunctivae are normal.  Neck: Normal range of motion.  Cardiovascular: Normal rate, regular rhythm, normal heart sounds and intact distal  pulses.   Pulmonary/Chest: Effort normal and breath sounds normal. He has no decreased breath sounds. He has no wheezes. He has no rales.  Abdominal: Soft. Bowel sounds are normal. There is no hepatosplenomegaly. There is tenderness in the suprapubic area. There is no rigidity, no rebound, no guarding and no CVA tenderness.       Slight suprapubic discomfort with deep palpation.  Musculoskeletal: Normal range  of motion.  Neurological: He is alert.  Skin: Skin is warm and dry.  Psychiatric: He has a normal mood and affect.    ED Course  Procedures (including critical care time)  Labs Reviewed  RAPID STREP SCREEN - Abnormal; Notable for the following:    Streptococcus, Group A Screen (Direct) POSITIVE (*)     All other components within normal limits  URINALYSIS, ROUTINE W REFLEX MICROSCOPIC   Dg Chest 2 View  09/25/2011  *RADIOLOGY REPORT*  Clinical Data: Fever and cough.  Weakness.  CHEST - 2 VIEW  Comparison: Chest x-ray 05/18/2011.  Findings: Lung volumes are low.  No acute consolidative airspace disease.  Linear opacity at the left base may represent an area of scarring or subsegmental atelectasis.  No pleural effusions.  No pneumothorax.  Pulmonary vasculature is normal.  Heart size is normal.  Mediastinal contours are unremarkable.  Vascular stent in place projecting in the expected location of the left common carotid artery or proximal left subclavian artery.  Status post median sternotomy.  IMPRESSION: 1.  No radiographic evidence of acute cardiopulmonary disease. 2.  The appearance of the chest is essentially unchanged compared to prior study 05/18/2011, as above.  Original Report Authenticated By: Florencia Reasons, M.D.     1. Strep throat    Bicillin LA 1.2 mil units IM given prior to dc home.     MDM  Strep pharyngitis.  Pt with no other source of fever found,  Labs,  xrays reviewed.  Fever improved with tylenol given.  Orthostatic VS reviewed and normal.      Date:  09/25/2011  Rate: 91  Rhythm: normal sinus rhythm  QRS Axis: normal  Intervals: normal  ST/T Wave abnormalities: normal  Conduction Disutrbances:none  Narrative Interpretation:   Old EKG Reviewed: unchanged     Burgess Amor, PA 09/25/11 1641

## 2012-01-13 ENCOUNTER — Other Ambulatory Visit: Payer: Self-pay | Admitting: Pain Medicine

## 2012-01-13 DIAGNOSIS — M545 Low back pain: Secondary | ICD-10-CM

## 2012-01-31 ENCOUNTER — Ambulatory Visit
Admission: RE | Admit: 2012-01-31 | Discharge: 2012-01-31 | Disposition: A | Discharge status: Home / Self Care | Payer: Medicare Other | Source: Ambulatory Visit | Attending: Pain Medicine | Admitting: Pain Medicine

## 2012-01-31 DIAGNOSIS — M545 Low back pain: Secondary | ICD-10-CM

## 2012-01-31 MED ORDER — GADOBENATE DIMEGLUMINE 529 MG/ML IV SOLN
20.0000 mL | Freq: Once | INTRAVENOUS | Status: AC | PRN
  Administered 2012-01-31: 20 mL via INTRAVENOUS

## 2012-04-03 ENCOUNTER — Emergency Department (HOSPITAL_COMMUNITY)
Admission: EM | Admit: 2012-04-03 | Discharge: 2012-04-03 | Disposition: A | Discharge status: Home / Self Care | Payer: PRIVATE HEALTH INSURANCE | Attending: Emergency Medicine | Admitting: Emergency Medicine

## 2012-04-03 ENCOUNTER — Encounter (HOSPITAL_COMMUNITY): Payer: Self-pay | Admitting: *Deleted

## 2012-04-03 ENCOUNTER — Emergency Department (HOSPITAL_COMMUNITY): Payer: PRIVATE HEALTH INSURANCE

## 2012-04-03 DIAGNOSIS — S0990XA Unspecified injury of head, initial encounter: Secondary | ICD-10-CM | POA: Insufficient documentation

## 2012-04-03 DIAGNOSIS — E7800 Pure hypercholesterolemia, unspecified: Secondary | ICD-10-CM | POA: Insufficient documentation

## 2012-04-03 DIAGNOSIS — Z7982 Long term (current) use of aspirin: Secondary | ICD-10-CM | POA: Insufficient documentation

## 2012-04-03 DIAGNOSIS — R05 Cough: Secondary | ICD-10-CM | POA: Insufficient documentation

## 2012-04-03 DIAGNOSIS — J40 Bronchitis, not specified as acute or chronic: Secondary | ICD-10-CM

## 2012-04-03 DIAGNOSIS — R0789 Other chest pain: Secondary | ICD-10-CM | POA: Insufficient documentation

## 2012-04-03 DIAGNOSIS — R059 Cough, unspecified: Secondary | ICD-10-CM | POA: Insufficient documentation

## 2012-04-03 DIAGNOSIS — Y929 Unspecified place or not applicable: Secondary | ICD-10-CM | POA: Insufficient documentation

## 2012-04-03 DIAGNOSIS — I252 Old myocardial infarction: Secondary | ICD-10-CM | POA: Insufficient documentation

## 2012-04-03 DIAGNOSIS — I509 Heart failure, unspecified: Secondary | ICD-10-CM | POA: Insufficient documentation

## 2012-04-03 DIAGNOSIS — Z79899 Other long term (current) drug therapy: Secondary | ICD-10-CM | POA: Insufficient documentation

## 2012-04-03 DIAGNOSIS — I10 Essential (primary) hypertension: Secondary | ICD-10-CM | POA: Insufficient documentation

## 2012-04-03 DIAGNOSIS — W1809XA Striking against other object with subsequent fall, initial encounter: Secondary | ICD-10-CM | POA: Insufficient documentation

## 2012-04-03 DIAGNOSIS — Z862 Personal history of diseases of the blood and blood-forming organs and certain disorders involving the immune mechanism: Secondary | ICD-10-CM | POA: Insufficient documentation

## 2012-04-03 DIAGNOSIS — R0602 Shortness of breath: Secondary | ICD-10-CM | POA: Insufficient documentation

## 2012-04-03 DIAGNOSIS — Z87828 Personal history of other (healed) physical injury and trauma: Secondary | ICD-10-CM | POA: Insufficient documentation

## 2012-04-03 DIAGNOSIS — IMO0002 Reserved for concepts with insufficient information to code with codable children: Secondary | ICD-10-CM | POA: Insufficient documentation

## 2012-04-03 DIAGNOSIS — G40909 Epilepsy, unspecified, not intractable, without status epilepticus: Secondary | ICD-10-CM | POA: Insufficient documentation

## 2012-04-03 DIAGNOSIS — R51 Headache: Secondary | ICD-10-CM

## 2012-04-03 DIAGNOSIS — J209 Acute bronchitis, unspecified: Secondary | ICD-10-CM | POA: Insufficient documentation

## 2012-04-03 DIAGNOSIS — Z8739 Personal history of other diseases of the musculoskeletal system and connective tissue: Secondary | ICD-10-CM | POA: Insufficient documentation

## 2012-04-03 DIAGNOSIS — K219 Gastro-esophageal reflux disease without esophagitis: Secondary | ICD-10-CM | POA: Insufficient documentation

## 2012-04-03 DIAGNOSIS — Y939 Activity, unspecified: Secondary | ICD-10-CM | POA: Insufficient documentation

## 2012-04-03 DIAGNOSIS — Z8711 Personal history of peptic ulcer disease: Secondary | ICD-10-CM | POA: Insufficient documentation

## 2012-04-03 LAB — CBC WITH DIFFERENTIAL/PLATELET
Eosinophils Absolute: 0.1 10*3/uL (ref 0.0–0.7)
HCT: 37.5 % — ABNORMAL LOW (ref 39.0–52.0)
Hemoglobin: 13.2 g/dL (ref 13.0–17.0)
Lymphs Abs: 2.4 10*3/uL (ref 0.7–4.0)
MCH: 30.7 pg (ref 26.0–34.0)
Monocytes Absolute: 0.5 10*3/uL (ref 0.1–1.0)
Monocytes Relative: 6 % (ref 3–12)
Neutrophils Relative %: 65 % (ref 43–77)
RBC: 4.3 MIL/uL (ref 4.22–5.81)

## 2012-04-03 LAB — BASIC METABOLIC PANEL
BUN: 9 mg/dL (ref 6–23)
Chloride: 100 mEq/L (ref 96–112)
GFR calc non Af Amer: >90 mL/min (ref >90)
Glucose, Bld: 100 mg/dL — ABNORMAL HIGH (ref 70–99)
Potassium: 3.3 mEq/L — ABNORMAL LOW (ref 3.5–5.1)

## 2012-04-03 MED ORDER — PREDNISONE 20 MG PO TABS: ORAL_TABLET | ORAL | Status: DC

## 2012-04-03 MED ORDER — DIPHENHYDRAMINE HCL 50 MG/ML IJ SOLN
25.0000 mg | Freq: Once | INTRAMUSCULAR | Status: AC
  Administered 2012-04-03: 21:00:00 via INTRAVENOUS
  Filled 2012-04-03: qty 1

## 2012-04-03 MED ORDER — METOCLOPRAMIDE HCL 5 MG/ML IJ SOLN
10.0000 mg | Freq: Once | INTRAMUSCULAR | Status: AC
  Administered 2012-04-03: 10 mg via INTRAVENOUS
  Filled 2012-04-03: qty 2

## 2012-04-03 MED ORDER — AZITHROMYCIN 250 MG PO TABS: ORAL_TABLET | ORAL | Status: DC

## 2012-04-03 NOTE — ED Notes (Signed)
Pt in CT.

## 2012-04-03 NOTE — ED Provider Notes (Signed)
History     CSN: [id number]  Arrival date & time 04/03/12  1722   First MD Initiated Contact with Patient 04/03/12 1801      Chief Complaint  Patient presents with  . Headache    (Consider location/radiation/quality/duration/timing/severity/associated sxs/prior treatment) HPI Comments: Patient presents to the ER for evaluation of headache. Patient reports that he has had a headache for 2 weeks since the fall. Patient reports that he received an epidural injection in his back at his pain clinic and afterwards had a syncopal episode. He fell backwards and hit the back of his head on the ground. He has not been seen since the fall. Patient reports he has had persistent headache, causing him difficulty sleeping at night. His Percocet is not helping. Patient has not had any change in his back pain.  Patient also reports shortness of breath. He has a history of bronchitis. He uses a nebulizer at home but has not been getting relief. He has had increased tightness in his chest, difficulty breathing with dry nonproductive cough. He has not had a fever.  Patient is a 52 y.o. male presenting with headaches.  Headache Associated symptoms: back pain and cough     Past Medical History  Diagnosis Date  . Hypertension   . High cholesterol   . Congestive heart failure (CHF)   . Arthritis   . MI (myocardial infarction)     7, last one was in 2011  . PUD (peptic ulcer disease)     in 1990s, secondary to medication  . Laceration of right hand 11/27/10  . Chronic back pain     Pain Clinic in [place]  . Chronic bronchitis   . CHF (congestive heart failure)   . Bronchitis, chronic   . Anemia   . GERD (gastroesophageal reflux disease)   . Frequency of urination   . Seizures     entire life, last seizure in 2011;unknown etiology-pt sts heriditary    Past Surgical History  Procedure Laterality Date  . Carpal tunnel release      bilateral  . Knee surgery      left-plate to left knee cap  from accident  . Coronary artery bypass graft  2002    3 vessels  . Back surgery      3-  . Savory dilation  12/28/2010    Procedure: SAVORY DILATION;  Surgeon: Arlyce Harman, MD;  Location: [facility];  Service: Endoscopy;  Laterality: N/A;  dilated with 16mm    Family History  Problem Relation Age of Onset  . Diabetes Mother   . Hypertension Mother   . Hypertension Father   . Diabetes Father   . Heart attack      mother, father, brother, sister all deceased due to MI  . Colon cancer Neg Hx   . Liver disease Neg Hx   . Anesthesia problems Neg Hx   . Hypotension Neg Hx   . Malignant hyperthermia Neg Hx   . Pseudochol deficiency Neg Hx     History  Substance Use Topics  . Smoking status: Never Smoker   . Smokeless tobacco: Not on file  . Alcohol Use: No      Review of Systems  Respiratory: Positive for cough, chest tightness and shortness of breath.   Musculoskeletal: Positive for back pain.  Neurological: Positive for headaches.  All other systems reviewed and are negative.    Allergies  Gabapentin; Ibuprofen; Naproxen; and Zolpidem tartrate  Home Medications   Current  Outpatient Rx  Name  Route  Sig  Dispense  Refill  . albuterol (PROVENTIL HFA;VENTOLIN HFA) 108 (90 BASE) MCG/ACT inhaler   Inhalation   Inhale 2 puffs into the lungs every 6 (six) hours as needed. Shortness of breath          . alprazolam (XANAX) 2 MG tablet   Oral   Take 2 mg by mouth Twice daily.         . ANDROGEL PUMP 1.25 GM/ACT (1%) GEL   Topical   Apply 4 application topically daily. *4 pumps applied to either shoulder daily*           has blood work done every month   . aspirin 325 MG EC tablet   Oral   Take 325 mg by mouth daily.           [name] Kitchen atorvastatin (LIPITOR) 10 MG tablet   Oral   Take 10 mg by mouth daily.           . cetirizine (ZYRTEC) 10 MG tablet   Oral   Take 10 mg by mouth daily as needed.          . furosemide (LASIX) 40 MG tablet   Oral   Take  40 mg by mouth daily.           . hydrochlorothiazide (HYDRODIURIL) 25 MG tablet   Oral   Take 25 mg by mouth daily.         [name] Kitchen levETIRAcetam (KEPPRA) 500 MG tablet   Oral   Take 500 mg by mouth every 12 (twelve) hours.           [name] Kitchen lisinopril (PRINIVIL,ZESTRIL) 10 MG tablet   Oral   Take 5 mg by mouth daily. Take half of a tablet         . metoprolol tartrate (LOPRESSOR) 25 MG tablet   Oral   Take 25 mg by mouth 2 (two) times daily.           . mometasone (NASONEX) 50 MCG/ACT nasal spray   Nasal   Place 2 sprays into the nose daily as needed. For congestion         . omeprazole (PRILOSEC) 20 MG capsule      1 po 30 minutes prior to meals twice a day for 3 mos   60 capsule   11   . oxyCODONE-acetaminophen (PERCOCET) 10-325 MG per tablet   Oral   Take 1 tablet by mouth 4 (four) times daily as needed. pain         . potassium chloride (KLOR-CON) 20 MEQ packet   Oral   Take 20 mEq by mouth 2 (two) times daily.   8 packet   0   . sertraline (ZOLOFT) 100 MG tablet   Oral   Take 100 mg by mouth Every morning.         . Tamsulosin HCl (FLOMAX) 0.4 MG CAPS   Oral   Take 0.4 mg by mouth at bedtime.            BP 119/74  Pulse 60  Temp(Src) 98.2 F (36.8 C) (Oral)  Resp 20  Ht 5\' 10"  (1.778 m)  Wt 250 lb (113.399 kg)  BMI 35.87 kg/m2  SpO2 100%  Physical Exam  Constitutional: He is oriented to person, place, and time. He appears well-developed and well-nourished. No distress.  HENT:  Head: Normocephalic and atraumatic.  Right Ear: Hearing normal.  Nose: Nose normal.  Mouth/Throat:  Oropharynx is clear and moist and mucous membranes are normal.  Eyes: Conjunctivae and EOM are normal. Pupils are equal, round, and reactive to light.  Neck: Normal range of motion. Neck supple.  Cardiovascular: Normal rate, regular rhythm, S1 normal and S2 normal.  Exam reveals no gallop and no friction rub.   No murmur heard. Pulmonary/Chest: Effort normal and  breath sounds normal. No respiratory distress. He exhibits no tenderness.  Abdominal: Soft. Normal appearance and bowel sounds are normal. There is no hepatosplenomegaly. There is no tenderness. There is no rebound, no guarding, no tenderness at McBurney's point and negative Murphy's sign. No hernia.  Musculoskeletal: Normal range of motion.  Neurological: He is alert and oriented to person, place, and time. He has normal strength. No cranial nerve deficit or sensory deficit. Coordination normal. GCS eye subscore is 4. GCS verbal subscore is 5. GCS motor subscore is 6.  Skin: Skin is warm, dry and intact. No rash noted. No cyanosis.  Psychiatric: He has a normal mood and affect. His speech is normal and behavior is normal. Thought content normal.    ED Course  Procedures (including critical care time)   Date: 04/03/2012  Rate: 58  Rhythm: normal sinus rhythm  QRS Axis: normal  Intervals: normal  ST/T Wave abnormalities: normal  Conduction Disutrbances:none  Narrative Interpretation:   Old EKG Reviewed: unchanged    Labs Reviewed  CBC WITH DIFFERENTIAL - Abnormal; Notable for the following:    HCT 37.5 (*)    All other components within normal limits  BASIC METABOLIC PANEL - Abnormal; Notable for the following:    Potassium 3.3 (*)    Glucose, Bld 100 (*)    All other components within normal limits  TROPONIN I  PRO B NATRIURETIC PEPTIDE   Dg Chest 2 View  04/03/2012  *RADIOLOGY REPORT*  Clinical Data: Loss of consciousness.  Shortness of breath.  CHEST - 2 VIEW  Comparison: 09/25/2011  Findings: There is been previous median sternotomy and CABG. Left subclavian stent is present.  Heart size is at the upper limits of normal.  The vascularity is normal.  Lungs are clear.  No effusions.  No acute bony findings.  IMPRESSION: Previous CABG.  Left subclavian stent.  No active disease evident.   Original Report Authenticated By: Paulina Fusi, M.D.    Ct Head Wo Contrast  04/03/2012   *RADIOLOGY REPORT*  Clinical Data: Fall, headache.  CT HEAD WITHOUT CONTRAST  Technique:  Contiguous axial images were obtained from the base of the skull through the vertex without contrast.  Comparison: 04/17/2010  Findings: No acute intracranial abnormality.  Specifically, no hemorrhage, hydrocephalus, mass lesion, acute infarction, or significant intracranial injury.  No acute calvarial abnormality. Visualized paranasal sinuses and mastoids clear.  Orbital soft tissues unremarkable.  Dural calcifications noted along the falx.  IMPRESSION: No acute intracranial abnormality.   Original Report Authenticated By: Charlett Nose, M.D.      Diagnosis: 1. Headache 2. Bronchitis    MDM  Patient presents to ER with complaints of headache. Patient reports that he fell 2 weeks ago. He reportedly had a syncopal episode at his pain clinic. Patient had just received an epidural injection prior to the syncopal episode. This is most likely a vasovagal episode. Patient did not have any cardiac symptoms. Patient has had persistent headaches since the fall, however. CT of head was unremarkable, no evidence of injury.  Patient also complaining of shortness of breath. Patient reports history of chronic bronchitis, does use  albuterol intermittently. Has been using it once or twice a day. There has not been any relief. Patient does report a previous history of this heart failure. Workup today, however, is negative for any sign of congestive heart failure. Chest x-ray did not show any evidence of pneumonia. Patient's oxygenation is normal. Patient will have prednisone and Zithromax added to his regimen. Increase bronchodilator usage.        Gilda Crease, MD 04/03/12 2034

## 2012-04-03 NOTE — ED Notes (Signed)
Headache for 2 weeks,  Says he "passed out", 2 weeks ago at pain clinic and struck back of his head.  Also having problem with his bronchitis, having to take neb treatments.

## 2012-04-19 ENCOUNTER — Encounter: Payer: Self-pay | Admitting: Gastroenterology

## 2012-04-20 ENCOUNTER — Encounter: Payer: Self-pay | Admitting: Urgent Care

## 2012-04-20 ENCOUNTER — Ambulatory Visit (INDEPENDENT_AMBULATORY_CARE_PROVIDER_SITE_OTHER): Payer: Medicare Other | Admitting: Urgent Care

## 2012-04-20 VITALS — BP 131/80 | HR 71 | Temp 97.2°F | Ht 70.0 in | Wt 245.4 lb

## 2012-04-20 DIAGNOSIS — K219 Gastro-esophageal reflux disease without esophagitis: Secondary | ICD-10-CM

## 2012-04-20 NOTE — Patient Instructions (Addendum)
Try Nexium 40 mg before breakfast and dinner Stop omeprazole Please call me in 10 days with a progress report If you are no better, you will need an EGD (upper endoscopy) with Bravo study with Dr. [name] Diet for Gastroesophageal Reflux Disease, Adult Reflux (acid reflux) is when acid from your stomach flows up into the esophagus. When acid comes in contact with the esophagus, the acid causes irritation and soreness (inflammation) in the esophagus. When reflux happens often or so severely that it causes damage to the esophagus, it is called gastroesophageal reflux disease (GERD). Nutrition therapy can help ease the discomfort of GERD. FOODS OR DRINKS TO AVOID OR LIMIT  Smoking or chewing tobacco. Nicotine is one of the most potent stimulants to acid production in the gastrointestinal tract.  Caffeinated and decaffeinated coffee and black tea.  Regular or low-calorie carbonated beverages or energy drinks (caffeine-free carbonated beverages are allowed).   Strong spices, such as black pepper, white pepper, red pepper, cayenne, curry powder, and chili powder.  Peppermint or spearmint.  Chocolate.  High-fat foods, including meats and fried foods. Extra added fats including oils, butter, salad dressings, and nuts. Limit these to less than 8 tsp per day.  Fruits and vegetables if they are not tolerated, such as citrus fruits or tomatoes.  Alcohol.  Any food that seems to aggravate your condition. If you have questions regarding your diet, call your caregiver or a registered dietitian. OTHER THINGS THAT MAY HELP GERD INCLUDE:   Eating your meals slowly, in a relaxed setting.  Eating 5 to 6 small meals per day instead of 3 large meals.  Eliminating food for a period of time if it causes distress.  Not lying down until 3 hours after eating a meal.  Keeping the head of your bed raised 6 to 9 inches (15 to 23 cm) by using a foam wedge or blocks under the legs of the bed. Lying flat may  make symptoms worse.  Being physically active. Weight loss may be helpful in reducing reflux in overweight or obese adults.  Wear loose fitting clothing EXAMPLE MEAL PLAN This meal plan is approximately 2,000 calories based on https://www.[facility].org/ meal planning guidelines. Breakfast   cup cooked oatmeal.  1 cup strawberries.  1 cup low-fat milk.  1 oz almonds. Snack  1 cup cucumber slices.  6 oz yogurt (made from low-fat or fat-free milk). Lunch  2 slice whole-wheat bread.  2 oz sliced [place].  2 tsp mayonnaise.  1 cup blueberries.  1 cup snap peas. Snack  6 whole-wheat crackers.  1 oz string cheese. Dinner   cup brown rice.  1 cup mixed veggies.  1 tsp olive oil.  3 oz grilled fish. Document Released: 02/08/2005 Document Revised: 05/03/2011 Document Reviewed: 12/25/2010 [facility] Patient Information 2013 [place], [place].

## 2012-04-20 NOTE — Progress Notes (Signed)
Referring Provider: Reynolds Bowl, MD Primary Care Physician:  Dorrene German, MD Primary Gastroenterologist:  Dr. Jonette Eva  Chief Complaint  Patient presents with  . Gastrophageal Reflux    HPI:  Dalton [name] is a 52 y.o. male here for follow up for refractory GERD.  Last seen 03/30/11 by Tana Coast, PAC.  On 03/18/11 he had a BPE-Small amount of gastroesophageal reflux. Otherwise unremarkable study.  He has been on omeprazole 20mg  BID.  He has nocturnal refractory symptoms.  His heartburn is chronic & he has chronic bronchitis w/ nebs BID.  Uses Inhaler PRN.  Hx dysphagia that has improved s/p last dilation in 2012.    Denies melena or rectal bleeding.  Admits to no weight loss as recommended by Dr [name].  Vitals - 1 value per visit 04/20/2012 04/03/2012 09/25/2011 08/31/2011 05/18/2011  Weight (lb) 245.4 250 242 244 254   Vitals - 1 value per visit 03/30/2011  Weight (lb) 242    Past Medical History  Diagnosis Date  . Hypertension   . High cholesterol   . Congestive heart failure (CHF)   . Arthritis   . MI (myocardial infarction)     7, last one was in 2011  . PUD (peptic ulcer disease)     in 1990s, secondary to medication  . Laceration of right hand 11/27/10  . Chronic back pain     Pain Clinic in [place]  . Chronic bronchitis   . CHF (congestive heart failure)   . Bronchitis, chronic   . Anemia   . GERD (gastroesophageal reflux disease)   . Frequency of urination   . Seizures     entire life, last seizure in 2011;unknown etiology-pt sts heriditary  . Tonsillitis, chronic     Dr. [name] in [place]    Past Surgical History  Procedure Laterality Date  . Carpal tunnel release      bilateral  . Knee surgery      left-plate to left knee cap from accident  . Coronary artery bypass graft  2002    3 vessels  . Back surgery      3-  . Savory dilation  12/28/2010    SLF:([facility])J-shaped stomach/nodular mocosa in the distal esophagus/empiric dilation 16mm  .  Colonoscopy  12/28/10    SLF: ([facility])Internal hemorrhoids/four small colon polyps    Current Outpatient Prescriptions  Medication Sig Dispense Refill  . albuterol (PROAIR HFA) 108 (90 BASE) MCG/ACT inhaler Inhale 2 puffs into the lungs every 6 (six) hours as needed for wheezing (for shortness of breath).      [name] Kitchen albuterol (PROVENTIL) (2.5 MG/3ML) 0.083% nebulizer solution Take 2.5 mg by nebulization 2 (two) times daily.      [name] Kitchen alprazolam (XANAX) 2 MG tablet Take 2 mg by mouth Twice daily.      . ANDROGEL PUMP 1.25 GM/ACT (1%) GEL Apply 4 application topically daily. *4 pumps applied to either shoulder daily*      . aspirin 325 MG EC tablet Take 325 mg by mouth daily.        [name] Kitchen atorvastatin (LIPITOR) 10 MG tablet Take 10 mg by mouth every morning.       . beclomethasone (QVAR) 80 MCG/ACT inhaler Inhale 2 puffs into the lungs 2 (two) times daily.      . cetirizine (ZYRTEC) 10 MG tablet Take 10 mg by mouth daily as needed.       . furosemide (LASIX) 40 MG tablet Take 40 mg by mouth daily.        [name] Kitchen  hydrochlorothiazide (HYDRODIURIL) 25 MG tablet Take 25 mg by mouth daily.      [name] Kitchen ipratropium (ATROVENT) 0.02 % nebulizer solution Take 500 mcg by nebulization 2 (two) times daily.      [name] Kitchen levETIRAcetam (KEPPRA) 500 MG tablet Take 500 mg by mouth every 12 (twelve) hours.        [name] Kitchen lisinopril (PRINIVIL,ZESTRIL) 10 MG tablet Take 5 mg by mouth daily. Take half of a tablet      . metoprolol tartrate (LOPRESSOR) 25 MG tablet Take 25 mg by mouth 2 (two) times daily.        . mometasone (NASONEX) 50 MCG/ACT nasal spray Place 2 sprays into the nose daily as needed. For congestion      . omeprazole (PRILOSEC) 20 MG capsule Take 20 mg by mouth 2 (two) times daily. 1 po 30 minutes prior to meals twice a day for 3 mos      . oxyCODONE-acetaminophen (PERCOCET) 10-325 MG per tablet Take 1 tablet by mouth 4 (four) times daily as needed. pain      . potassium chloride (KLOR-CON) 20 MEQ packet Take 20 mEq by mouth 2 (two)  times daily.  8 packet  0  . sertraline (ZOLOFT) 100 MG tablet Take 100 mg by mouth Every morning.      . Tamsulosin HCl (FLOMAX) 0.4 MG CAPS Take 0.4 mg by mouth at bedtime.        No current facility-administered medications for this visit.    Allergies as of 04/20/2012 - Review Complete 04/20/2012  Allergen Reaction Noted  . Gabapentin Hives   . Ibuprofen Other (See Comments)   . Naproxen    . Zolpidem tartrate      Review of Systems: Gen: Denies any fever, chills, sweats, anorexia, fatigue, weakness, malaise, weight loss, and sleep disorder CV: Denies chest pain, angina, palpitations, syncope, orthopnea, PND, peripheral edema, and claudication. Resp: Denies dyspnea at rest, dyspnea with exercise, cough, sputum, wheezing, coughing up blood, and pleurisy. GI: Denies vomiting blood, jaundice, and fecal incontinence.  Derm: Denies rash, itching, dry skin, hives, moles, warts, or unhealing ulcers.  Psych: Denies depression, anxiety, memory loss, suicidal ideation, hallucinations, paranoia, and confusion. Heme: Denies bruising, bleeding, and enlarged lymph nodes.  Physical Exam: BP 131/80  Pulse 71  Temp(Src) 97.2 F (36.2 C) (Oral)  Ht 5\' 10"  (1.778 m)  Wt 245 lb 6.4 oz (111.313 kg)  BMI 35.21 kg/m2 General:   Alert,  Well-developed, obese, pleasant and cooperative in NAD Eyes:  Sclera clear, no icterus.   Conjunctiva pink. Mouth:  No deformity or lesions, oropharynx pink and moist. Neck:  Supple; no masses or thyromegaly. Heart:  Regular rate and rhythm; no murmurs, clicks, rubs,  or gallops. Abdomen:  Protuberant.  Normal bowel sounds.  No bruits.  Soft, non-tender and non-distended without masses, hepatosplenomegaly or hernias noted.  No guarding or rebound tenderness.  Exam limited given body habitus. Rectal:  Deferred.  Msk:  Symmetrical without gross deformities.  Pulses:  Normal pulses noted. Extremities:  No clubbing or edema. Neurologic:  Alert and oriented x4;   grossly normal neurologically. Skin:  Intact without significant lesions or rashes.

## 2012-04-22 ENCOUNTER — Encounter: Payer: Self-pay | Admitting: Urgent Care

## 2012-04-22 NOTE — Assessment & Plan Note (Signed)
Refractory GERD despite BID omeprazole 20mg .  Nocturnal symptoms are worst.  Lack of dietary discretion, motivation to lose weight & lifestyle modifications contributing to symptoms.  Trial different PPI, if no relief, he will need EGD with Bravo study with Dr [name].  Try Nexium 40 mg before breakfast and dinner Stop omeprazole Please call me in 10 days with a progress report

## 2012-04-24 NOTE — Progress Notes (Signed)
Faxed to PCP and referring Suhey Radford 

## 2012-05-11 ENCOUNTER — Telehealth: Payer: Self-pay | Admitting: *Deleted

## 2012-05-11 NOTE — Telephone Encounter (Signed)
Ok, let's proceed with EGD with possible BRAVO with Dr [name] in OR w/ propofol given multiple psychoactive meds, refractory GERD. Thanks

## 2012-05-11 NOTE — Telephone Encounter (Signed)
Dalton [name] called today with an update for Dalton [name]. Please return his call. Thank you.

## 2012-05-11 NOTE — Telephone Encounter (Signed)
Called and spoke to pt. He said the Nexium bid is helping some, but not a lot. He saw the ENT doctor about his tonsils and said good news was no cancer. He is ready to proceed with EGD/ next step . ( He said that the ENT doctor in [place] was going to send some reports here.   He uses [facility] in [place]. Wants to know should he continue the Nexium or wait til after procedure.  He only got a few and will be running out.   Please advise!

## 2012-05-12 ENCOUNTER — Other Ambulatory Visit: Payer: Self-pay | Admitting: Gastroenterology

## 2012-05-12 NOTE — Telephone Encounter (Signed)
Patient is scheduled for EGD w/Poss BRAVO w/SLF in the OR on 04/08 at 8:15 and I have mailed him the instructions

## 2012-05-15 ENCOUNTER — Encounter (HOSPITAL_COMMUNITY): Payer: Self-pay | Admitting: Pharmacy Technician

## 2012-05-15 MED ORDER — ESOMEPRAZOLE MAGNESIUM 40 MG PO CPDR: 40.0000 mg | DELAYED_RELEASE_CAPSULE | Freq: Every day | ORAL | Status: DC

## 2012-05-15 NOTE — Addendum Note (Signed)
Addended by: Nira Retort on: 05/15/2012 04:38 PM   Modules accepted: Orders

## 2012-05-15 NOTE — Telephone Encounter (Signed)
Pt is aware of the EGD. Dalton [name] he will get the instructions in the mail. He would like Rx sent to [facility] for the Nexium.

## 2012-05-15 NOTE — Telephone Encounter (Signed)
Nexium BID sent to pharmacy.

## 2012-05-16 NOTE — Telephone Encounter (Signed)
Tried to call pt to let know Rx was sent to the pharmacy for Nexium. Could not leave a message on either phone.

## 2012-05-16 NOTE — Telephone Encounter (Signed)
Called and informed pt.  

## 2012-05-23 ENCOUNTER — Encounter (HOSPITAL_COMMUNITY): Payer: Self-pay

## 2012-05-23 ENCOUNTER — Encounter (HOSPITAL_COMMUNITY)
Admission: RE | Admit: 2012-05-23 | Discharge: 2012-05-23 | Disposition: A | Discharge status: Home / Self Care | Payer: PRIVATE HEALTH INSURANCE | Source: Ambulatory Visit | Attending: Gastroenterology | Admitting: Gastroenterology

## 2012-05-23 HISTORY — DX: Shortness of breath: R06.02

## 2012-05-23 HISTORY — DX: Depression, unspecified: F32.A

## 2012-05-23 HISTORY — DX: Major depressive disorder, single episode, unspecified: F32.9

## 2012-05-23 HISTORY — DX: Headache: R51

## 2012-05-23 LAB — BASIC METABOLIC PANEL
BUN: 11 mg/dL (ref 6–23)
Creatinine, Ser: 1.32 mg/dL (ref 0.50–1.35)
GFR calc non Af Amer: 61 mL/min — ABNORMAL LOW (ref >90)
Glucose, Bld: 78 mg/dL (ref 70–99)
Potassium: 4.7 mEq/L (ref 3.5–5.1)

## 2012-05-23 NOTE — Patient Instructions (Addendum)
Dalton [name]  05/23/2012   Your procedure is scheduled on:   05/30/2012  Report to [facility] at  700  AM.  Call this number if you have problems the morning of surgery: [phone]   Remember:   Do not eat food or drink liquids after midnight.   Take these medicines the morning of surgery with A SIP OF WATER: flomax,xanax,nexium,hydrodiuril,keppra,lisinopril,metoprolol,prilosec,oxycodone,zoloft. Take proair and proventil inhalers before you come and atrovent nebulizer before you come.   Do not wear jewelry, make-up or nail polish.  Do not wear lotions, powders, or perfumes.   Do not shave 48 hours prior to surgery. Men may shave face and neck.  Do not bring valuables to the hospital.  Contacts, dentures or bridgework may not be worn into surgery.  Leave suitcase in the car. After surgery it may be brought to your room.  For patients admitted to the hospital, checkout time is 11:00 AM the day of discharge.   Patients discharged the day of surgery will not be allowed to drive  home.  Name and phone number of your driver: family  Special Instructions: N/A   Please read over the following fact sheets that you were given: Pain Booklet, Coughing and Deep Breathing, Surgical Site Infection Prevention, Anesthesia Post-op Instructions and Care and Recovery After Surgery Esophagogastroduodenoscopy This is an endoscopic procedure (a procedure that uses a device like a flexible telescope) that allows your caregiver to view the upper stomach and small bowel. This test allows your caregiver to look at the esophagus. The esophagus carries food from your mouth to your stomach. They can also look at your duodenum. This is the first part of the small intestine that attaches to the stomach. This test is used to detect problems in the bowel such as ulcers and inflammation. PREPARATION FOR TEST Nothing to eat after midnight the day before the test. NORMAL FINDINGS Normal esophagus, stomach, and  duodenum. Ranges for normal findings may vary among different laboratories and hospitals. You should always check with your doctor after having lab work or other tests done to discuss the meaning of your test results and whether your values are considered within normal limits. MEANING OF TEST  Your caregiver will go over the test results with you and discuss the importance and meaning of your results, as well as treatment options and the need for additional tests if necessary. OBTAINING THE TEST RESULTS It is your responsibility to obtain your test results. Ask the lab or department performing the test when and how you will get your results. Document Released: 06/11/2004 Document Revised: 05/03/2011 Document Reviewed: 01/19/2008 [facility] Patient Information 2013 [place], [place]. PATIENT INSTRUCTIONS POST-ANESTHESIA  IMMEDIATELY FOLLOWING SURGERY:  Do not drive or operate machinery for the first twenty four hours after surgery.  Do not make any important decisions for twenty four hours after surgery or while taking narcotic pain medications or sedatives.  If you develop intractable nausea and vomiting or a severe headache please notify your doctor immediately.  FOLLOW-UP:  Please make an appointment with your surgeon as instructed. You do not need to follow up with anesthesia unless specifically instructed to do so.  WOUND CARE INSTRUCTIONS (if applicable):  Keep a dry clean dressing on the anesthesia/puncture wound site if there is drainage.  Once the wound has quit draining you may leave it open to air.  Generally you should leave the bandage intact for twenty four hours unless there is drainage.  If the epidural  site drains for more than 36-48 hours please call the anesthesia department.  QUESTIONS?:  Please feel free to call your physician or the hospital operator if you have any questions, and they will be happy to assist you.

## 2012-05-30 ENCOUNTER — Ambulatory Visit (HOSPITAL_COMMUNITY)
Admission: RE | Admit: 2012-05-30 | Discharge: 2012-05-30 | Disposition: A | Discharge status: Home / Self Care | Payer: PRIVATE HEALTH INSURANCE | Source: Ambulatory Visit | Attending: Gastroenterology | Admitting: Gastroenterology

## 2012-05-30 ENCOUNTER — Encounter (HOSPITAL_COMMUNITY): Payer: Self-pay | Admitting: Anesthesiology

## 2012-05-30 ENCOUNTER — Encounter (HOSPITAL_COMMUNITY)
Admission: RE | Disposition: A | Discharge status: Home / Self Care | Payer: Self-pay | Source: Ambulatory Visit | Attending: Gastroenterology

## 2012-05-30 ENCOUNTER — Ambulatory Visit (HOSPITAL_COMMUNITY): Payer: PRIVATE HEALTH INSURANCE | Admitting: Anesthesiology

## 2012-05-30 ENCOUNTER — Encounter (HOSPITAL_COMMUNITY): Payer: Self-pay | Admitting: *Deleted

## 2012-05-30 DIAGNOSIS — K2990 Gastroduodenitis, unspecified, without bleeding: Secondary | ICD-10-CM

## 2012-05-30 DIAGNOSIS — K2970 Gastritis, unspecified, without bleeding: Secondary | ICD-10-CM

## 2012-05-30 DIAGNOSIS — K2940 Chronic atrophic gastritis without bleeding: Secondary | ICD-10-CM | POA: Insufficient documentation

## 2012-05-30 DIAGNOSIS — I10 Essential (primary) hypertension: Secondary | ICD-10-CM | POA: Insufficient documentation

## 2012-05-30 DIAGNOSIS — A048 Other specified bacterial intestinal infections: Secondary | ICD-10-CM | POA: Insufficient documentation

## 2012-05-30 DIAGNOSIS — K219 Gastro-esophageal reflux disease without esophagitis: Secondary | ICD-10-CM | POA: Insufficient documentation

## 2012-05-30 HISTORY — PX: BIOPSY: SHX5522

## 2012-05-30 HISTORY — PX: ESOPHAGOGASTRODUODENOSCOPY: SHX5428

## 2012-05-30 SURGERY — EGD (ESOPHAGOGASTRODUODENOSCOPY)
Anesthesia: Monitor Anesthesia Care

## 2012-05-30 MED ORDER — MIDAZOLAM HCL 2 MG/2ML IJ SOLN
INTRAMUSCULAR | Status: AC
  Filled 2012-05-30: qty 2

## 2012-05-30 MED ORDER — ESOMEPRAZOLE MAGNESIUM 40 MG PO CPDR: DELAYED_RELEASE_CAPSULE | ORAL | Status: DC

## 2012-05-30 MED ORDER — PROPOFOL INFUSION 10 MG/ML OPTIME
INTRAVENOUS | Status: DC | PRN
  Administered 2012-05-30: 100 ug/kg/min via INTRAVENOUS

## 2012-05-30 MED ORDER — PROPOFOL 10 MG/ML IV EMUL
INTRAVENOUS | Status: AC
  Filled 2012-05-30: qty 20

## 2012-05-30 MED ORDER — FENTANYL CITRATE 0.05 MG/ML IJ SOLN: 25.0000 ug | INTRAMUSCULAR | Status: DC | PRN

## 2012-05-30 MED ORDER — STERILE WATER FOR IRRIGATION IR SOLN
Status: DC | PRN
  Administered 2012-05-30: 08:00:00

## 2012-05-30 MED ORDER — FENTANYL CITRATE 0.05 MG/ML IJ SOLN
INTRAMUSCULAR | Status: DC | PRN
  Administered 2012-05-30 (×3): 25 ug via INTRAVENOUS

## 2012-05-30 MED ORDER — FENTANYL CITRATE 0.05 MG/ML IJ SOLN
25.0000 ug | INTRAMUSCULAR | Status: DC | PRN
  Administered 2012-05-30: 25 ug via INTRAVENOUS

## 2012-05-30 MED ORDER — ONDANSETRON HCL 4 MG/2ML IJ SOLN: 4.0000 mg | Freq: Once | INTRAMUSCULAR | Status: DC | PRN

## 2012-05-30 MED ORDER — LACTATED RINGERS IV SOLN
INTRAVENOUS | Status: DC
  Administered 2012-05-30: 07:00:00 via INTRAVENOUS

## 2012-05-30 MED ORDER — LIDOCAINE HCL (PF) 1 % IJ SOLN
INTRAMUSCULAR | Status: AC
  Filled 2012-05-30: qty 5

## 2012-05-30 MED ORDER — FENTANYL CITRATE 0.05 MG/ML IJ SOLN
INTRAMUSCULAR | Status: AC
  Filled 2012-05-30: qty 2

## 2012-05-30 MED ORDER — MIDAZOLAM HCL 2 MG/2ML IJ SOLN
1.0000 mg | INTRAMUSCULAR | Status: DC | PRN
  Administered 2012-05-30: 2 mg via INTRAVENOUS

## 2012-05-30 MED ORDER — STERILE WATER FOR IRRIGATION IR SOLN
Status: DC | PRN
  Administered 2012-05-30: 1000 mL

## 2012-05-30 SURGICAL SUPPLY — 17 items
BLOCK BITE 60FR ADLT L/F BLUE (MISCELLANEOUS) ×2 IMPLANT
ELECT REM PT RETURN 9FT ADLT (ELECTROSURGICAL)
ELECTRODE REM PT RTRN 9FT ADLT (ELECTROSURGICAL) IMPLANT
FLOOR PAD 36X40 (MISCELLANEOUS) ×2
FORCEP RJ3 GP 1.8X160 W-NEEDLE (CUTTING FORCEPS) IMPLANT
FORCEPS BIOP RAD 4 LRG CAP 4 (CUTTING FORCEPS) ×1 IMPLANT
NDL SCLEROTHERAPY 25GX240 (NEEDLE) IMPLANT
NEEDLE SCLEROTHERAPY 25GX240 (NEEDLE) ×2 IMPLANT
PAD FLOOR 36X40 (MISCELLANEOUS) ×1 IMPLANT
PROBE APC STR FIRE (PROBE) IMPLANT
PROBE INJECTION GOLD (MISCELLANEOUS)
PROBE INJECTION GOLD 7FR (MISCELLANEOUS) IMPLANT
SNARE SHORT THROW 13M SML OVAL (MISCELLANEOUS) IMPLANT
SYR 50ML LL SCALE MARK (SYRINGE) ×1 IMPLANT
TUBING ENDO SMARTCAP PENTAX (MISCELLANEOUS) ×4 IMPLANT
TUBING IRRIGATION ENDOGATOR (MISCELLANEOUS) ×2 IMPLANT
WATER STERILE IRR 1000ML POUR (IV SOLUTION) ×2 IMPLANT
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
## 2012-05-30 NOTE — Anesthesia Postprocedure Evaluation (Signed)
Anesthesia Post Note  Patient: Dalton [name]  Procedure(s) Performed: Procedure(s) (LRB): ESOPHAGOGASTRODUODENOSCOPY (EGD) (N/A) BRAVO PH STUDY (N/A) BIOPSY (N/A)  Anesthesia type: MAC  Patient location: PACU  Post pain: Pain level controlled  Post assessment: Post-op Vital signs reviewed, Patient's Cardiovascular Status Stable, Respiratory Function Stable, Patent Airway, No signs of Nausea or vomiting and Pain level controlled  Last Vitals:  Filed Vitals:   05/30/12 0807  BP: 95/57  Pulse: 66  Temp: 36.4 C  Resp: 20    Post vital signs: Reviewed and stable  Level of consciousness: awake and alert   Complications: No apparent anesthesia complications

## 2012-05-30 NOTE — Transfer of Care (Signed)
Immediate Anesthesia Transfer of Care Note  Patient: Dalton [name]  Procedure(s) Performed: Procedure(s) (LRB): ESOPHAGOGASTRODUODENOSCOPY (EGD) (N/A) BRAVO PH STUDY (N/A) BIOPSY (N/A)  Patient Location: PACU  Anesthesia Type: MAC  Level of Consciousness: awake  Airway & Oxygen Therapy: Patient Spontanous Breathing.   Post-op Assessment: Report given to PACU RN, Post -op Vital signs reviewed and stable and Patient moving all extremities  Post vital signs: Reviewed and stable  Complications: No apparent anesthesia complications

## 2012-05-30 NOTE — Anesthesia Procedure Notes (Signed)
Procedure Name: MAC Date/Time: 05/30/2012 7:45 AM Performed by: Franco Nones Pre-anesthesia Checklist: Patient identified, Emergency Drugs available, Suction available, Timeout performed and Patient being monitored Patient Re-evaluated:Patient Re-evaluated prior to inductionOxygen Delivery Method: Non-rebreather mask

## 2012-05-30 NOTE — Anesthesia Preprocedure Evaluation (Signed)
Anesthesia Evaluation  Patient identified by MRN, date of birth, ID band Patient awake    Reviewed: Allergy & Precautions, H&P , NPO status , Patient's Chart, lab work & pertinent test results, reviewed documented beta blocker date and time   History of Anesthesia Complications Negative for: history of anesthetic complications  Airway Mallampati: III  Neck ROM: Full    Dental  (+) Edentulous Upper and Edentulous Lower   Pulmonary neg pulmonary ROS, shortness of breath,    Pulmonary exam normal       Cardiovascular hypertension, Pt. on medications - angina+ CAD (no angina recently), + Past MI, + Cardiac Stents, + CABG and +CHF Rhythm:Regular Rate:Normal     Neuro/Psych  Headaches, Seizures -, Well Controlled,  PSYCHIATRIC DISORDERS Depression    GI/Hepatic PUD, GERD-  Medicated and Controlled,  Endo/Other    Renal/GU      Musculoskeletal  (+) Arthritis - (chronic LBP),   Abdominal   Peds  Hematology   Anesthesia Other Findings   Reproductive/Obstetrics                           Anesthesia Physical Anesthesia Plan  ASA: III  Anesthesia Plan: MAC   Post-op Pain Management:    Induction: Intravenous  Airway Management Planned: Simple Face Mask  Additional Equipment:   Intra-op Plan:   Post-operative Plan:   Informed Consent: I have reviewed the patients History and Physical, chart, labs and discussed the procedure including the risks, benefits and alternatives for the proposed anesthesia with the patient or authorized representative who has indicated his/her understanding and acceptance.     Plan Discussed with:   Anesthesia Plan Comments:         Anesthesia Quick Evaluation
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
## 2012-06-01 ENCOUNTER — Encounter (HOSPITAL_COMMUNITY): Payer: Self-pay | Admitting: Gastroenterology

## 2012-06-01 ENCOUNTER — Telehealth: Payer: Self-pay | Admitting: Gastroenterology

## 2012-06-12 MED ORDER — AMOXICILLIN 500 MG PO TABS: ORAL_TABLET | ORAL | Status: DC

## 2012-06-12 MED ORDER — CLARITHROMYCIN 500 MG PO TABS: ORAL_TABLET | ORAL | Status: DC

## 2012-06-12 NOTE — Telephone Encounter (Signed)
DISCUSSED WITH PT. WHEN HE WAS HAVING URINARY PROBLEMS, BEEN ON IT FOR A WHILE BUT FEELS HE DOESN'T HAVE TO USE IT. NOW USING AS NEEDED.  Called patient TO DISCUSS RESULTS. HIS stomach Bx showed H. Pylori infection. He needs AMOXICILLIN 500 mg 2 po BID for 10 days and Biaxin 500 mg po bid for 10 days.. CONTINUE NEXIUM BID DAILY. Med side effects include NVD, abd pain, and metallic taste. DO NOT TAKE ABX WITH LIPITOR OR FLOMAX. TAKE 1/2 DOSE OF ZOLOFT. PT VOICED UNDERSTANDING. OPV JUL 2014 E 30 DYSPEPSIA.

## 2012-06-12 NOTE — Telephone Encounter (Signed)
REVIEWED MED INTERACTIONS WITH PHARMACY([facility]). BIAXIN MAJOR-FLOMAX RISK OF ADVERSE, ZOLOFT-MAY INCREASE LEVELS.

## 2012-06-12 NOTE — Telephone Encounter (Signed)
Cc PCP 

## 2012-06-14 NOTE — Telephone Encounter (Signed)
Reminder in epic °

## 2012-08-12 NOTE — Progress Notes (Signed)
REVIEWED.  EGD APR 2014 H PYLORI-ABN BID FOR 10 DAYS

## 2012-08-12 NOTE — Progress Notes (Signed)
REVIEWED.  EGD APR 2014 H PYLORI-ABO BID FOR 10 DAYS

## 2012-09-07 ENCOUNTER — Emergency Department (HOSPITAL_COMMUNITY)
Admission: EM | Admit: 2012-09-07 | Discharge: 2012-09-07 | Disposition: A | Discharge status: Home / Self Care | Payer: PRIVATE HEALTH INSURANCE | Attending: Emergency Medicine | Admitting: Emergency Medicine

## 2012-09-07 ENCOUNTER — Emergency Department (HOSPITAL_COMMUNITY): Payer: PRIVATE HEALTH INSURANCE

## 2012-09-07 ENCOUNTER — Encounter (HOSPITAL_COMMUNITY): Payer: Self-pay

## 2012-09-07 DIAGNOSIS — K219 Gastro-esophageal reflux disease without esophagitis: Secondary | ICD-10-CM | POA: Insufficient documentation

## 2012-09-07 DIAGNOSIS — Z8711 Personal history of peptic ulcer disease: Secondary | ICD-10-CM | POA: Insufficient documentation

## 2012-09-07 DIAGNOSIS — E7800 Pure hypercholesterolemia, unspecified: Secondary | ICD-10-CM | POA: Insufficient documentation

## 2012-09-07 DIAGNOSIS — R5383 Other fatigue: Secondary | ICD-10-CM | POA: Insufficient documentation

## 2012-09-07 DIAGNOSIS — I252 Old myocardial infarction: Secondary | ICD-10-CM | POA: Insufficient documentation

## 2012-09-07 DIAGNOSIS — Z79899 Other long term (current) drug therapy: Secondary | ICD-10-CM | POA: Insufficient documentation

## 2012-09-07 DIAGNOSIS — Z7982 Long term (current) use of aspirin: Secondary | ICD-10-CM | POA: Insufficient documentation

## 2012-09-07 DIAGNOSIS — R5381 Other malaise: Secondary | ICD-10-CM | POA: Insufficient documentation

## 2012-09-07 DIAGNOSIS — F3289 Other specified depressive episodes: Secondary | ICD-10-CM | POA: Insufficient documentation

## 2012-09-07 DIAGNOSIS — Z87448 Personal history of other diseases of urinary system: Secondary | ICD-10-CM | POA: Insufficient documentation

## 2012-09-07 DIAGNOSIS — Z87828 Personal history of other (healed) physical injury and trauma: Secondary | ICD-10-CM | POA: Insufficient documentation

## 2012-09-07 DIAGNOSIS — G40909 Epilepsy, unspecified, not intractable, without status epilepticus: Secondary | ICD-10-CM | POA: Insufficient documentation

## 2012-09-07 DIAGNOSIS — E876 Hypokalemia: Secondary | ICD-10-CM | POA: Insufficient documentation

## 2012-09-07 DIAGNOSIS — I509 Heart failure, unspecified: Secondary | ICD-10-CM | POA: Insufficient documentation

## 2012-09-07 DIAGNOSIS — Z8709 Personal history of other diseases of the respiratory system: Secondary | ICD-10-CM | POA: Insufficient documentation

## 2012-09-07 DIAGNOSIS — I10 Essential (primary) hypertension: Secondary | ICD-10-CM | POA: Insufficient documentation

## 2012-09-07 DIAGNOSIS — Z951 Presence of aortocoronary bypass graft: Secondary | ICD-10-CM | POA: Insufficient documentation

## 2012-09-07 DIAGNOSIS — M791 Myalgia, unspecified site: Secondary | ICD-10-CM

## 2012-09-07 DIAGNOSIS — Z862 Personal history of diseases of the blood and blood-forming organs and certain disorders involving the immune mechanism: Secondary | ICD-10-CM | POA: Insufficient documentation

## 2012-09-07 DIAGNOSIS — R51 Headache: Secondary | ICD-10-CM | POA: Insufficient documentation

## 2012-09-07 DIAGNOSIS — IMO0002 Reserved for concepts with insufficient information to code with codable children: Secondary | ICD-10-CM | POA: Insufficient documentation

## 2012-09-07 DIAGNOSIS — R059 Cough, unspecified: Secondary | ICD-10-CM | POA: Insufficient documentation

## 2012-09-07 DIAGNOSIS — G8929 Other chronic pain: Secondary | ICD-10-CM | POA: Insufficient documentation

## 2012-09-07 DIAGNOSIS — M129 Arthropathy, unspecified: Secondary | ICD-10-CM | POA: Insufficient documentation

## 2012-09-07 DIAGNOSIS — R05 Cough: Secondary | ICD-10-CM | POA: Insufficient documentation

## 2012-09-07 DIAGNOSIS — IMO0001 Reserved for inherently not codable concepts without codable children: Secondary | ICD-10-CM | POA: Insufficient documentation

## 2012-09-07 DIAGNOSIS — F329 Major depressive disorder, single episode, unspecified: Secondary | ICD-10-CM | POA: Insufficient documentation

## 2012-09-07 LAB — COMPREHENSIVE METABOLIC PANEL
Alkaline Phosphatase: 70 U/L (ref 39–117)
BUN: 11 mg/dL (ref 6–23)
Calcium: 9.8 mg/dL (ref 8.4–10.5)
GFR calc Af Amer: 77 mL/min — ABNORMAL LOW (ref >90)
Glucose, Bld: 90 mg/dL (ref 70–99)
Potassium: 3 mEq/L — ABNORMAL LOW (ref 3.5–5.1)
Total Protein: 8.3 g/dL (ref 6.0–8.3)

## 2012-09-07 LAB — CBC WITH DIFFERENTIAL/PLATELET
Basophils Absolute: 0 10*3/uL (ref 0.0–0.1)
HCT: 41.7 % (ref 39.0–52.0)
Lymphocytes Relative: 29 % (ref 12–46)
Monocytes Absolute: 0.4 10*3/uL (ref 0.1–1.0)
Neutro Abs: 4.3 10*3/uL (ref 1.7–7.7)
Neutrophils Relative %: 64 % (ref 43–77)
RDW: 13.2 % (ref 11.5–15.5)
WBC: 6.7 10*3/uL (ref 4.0–10.5)

## 2012-09-07 LAB — TROPONIN I: Troponin I: <0.3 ng/mL (ref <0.30)

## 2012-09-07 MED ORDER — OXYCODONE-ACETAMINOPHEN 5-325 MG PO TABS: 2.0000 | ORAL_TABLET | ORAL | Status: DC | PRN

## 2012-09-07 MED ORDER — POTASSIUM CHLORIDE CRYS ER 20 MEQ PO TBCR
40.0000 meq | EXTENDED_RELEASE_TABLET | Freq: Once | ORAL | Status: AC
  Administered 2012-09-07: 40 meq via ORAL
  Filled 2012-09-07: qty 2

## 2012-09-07 MED ORDER — KETOROLAC TROMETHAMINE 30 MG/ML IJ SOLN
30.0000 mg | Freq: Once | INTRAMUSCULAR | Status: AC
  Administered 2012-09-07: 30 mg via INTRAVENOUS
  Filled 2012-09-07: qty 1

## 2012-09-07 MED ORDER — POTASSIUM CHLORIDE ER 10 MEQ PO TBCR: 20.0000 meq | EXTENDED_RELEASE_TABLET | Freq: Every day | ORAL | Status: DC

## 2012-09-07 NOTE — ED Notes (Signed)
Pt alert & oriented x4, stable gait. Patient given discharge instructions, paperwork & prescription(s). Patient  instructed to stop at the registration desk to finish any additional paperwork. Patient verbalized understanding. Pt left department w/ no further questions. 

## 2012-09-07 NOTE — ED Provider Notes (Signed)
History    This chart was scribed for Vida Roller, MD by Quintella Reichert, ED scribe.  This patient was seen in room [facility] and the patient's care was started at 8:50 PM.  CSN: [id number]  Arrival date & time 09/07/12  2010    Chief Complaint  Patient presents with  . Chest Pain    The history is provided by the patient. No language interpreter was used.    HPI Comments: Dalton [name] is a 52 y.o. male with h/o CHF, MI, chronic bronchitis, HTN, and high cholesterol who presents to the Emergency Department complaining of a near-syncopal episode that occurred this morning.  Pt also complains of 2 days of left-sided CP, cough, generalized body aches, and fatigue.  He reports that when he got out of bed to use the bathroom at 2:30 AM this morning he suddenly became shaky and lightheaded and felt as if he were going to faint.  He did not lose consciousness and this sensation subsided when he lay back in bed.  He denies urinary or bowel symptoms.  Pt has h/o coronary artery bypass.  He states he has been taking all of his medications as instructed.  He describes having diffuse myalgias over the course of the day, states that he hurts all over including a headache, this is persistent, nothing makes this better or worse, states that he has not been taking his potassium supplementation. He has not felt lightheaded or near syncopal since early this morning when this happened.    Past Medical History  Diagnosis Date  . Hypertension   . High cholesterol   . Congestive heart failure (CHF)   . Arthritis   . MI (myocardial infarction)     7, last one was in 2011  . PUD (peptic ulcer disease)     in 1990s, secondary to medication  . Laceration of right hand 11/27/10  . Chronic back pain     Pain Clinic in [place]  . Chronic bronchitis   . CHF (congestive heart failure)   . Bronchitis, chronic   . Anemia   . GERD (gastroesophageal reflux disease)   . Frequency of urination   .  Seizures     entire life, last seizure in 2011;unknown etiology-pt sts heriditary  . Tonsillitis, chronic     Dr. [name] in [place]  . Shortness of breath     with exertion  . Depression   . NWGNFAOZ(308.6)     Past Surgical History  Procedure Laterality Date  . Carpal tunnel release      bilateral  . Knee surgery      left-plate to left knee cap from accident  . Coronary artery bypass graft  2002    3 vessels  . Back surgery      3-  . Savory dilation  12/28/2010    SLF:(MAC)J-shaped stomach/nodular mocosa in the distal esophagus/empiric dilation 16mm  . Colonoscopy  12/28/10    SLF: (MAC)Internal hemorrhoids/four small colon polyps  . Esophagogastroduodenoscopy N/A 05/30/2012    Procedure: ESOPHAGOGASTRODUODENOSCOPY (EGD);  Surgeon: West Bali, MD;  Location: [facility];  Service: Endoscopy;  Laterality: N/A;  7:30  . Esophageal biopsy N/A 05/30/2012    Procedure: BIOPSY;  Surgeon: West Bali, MD;  Location: [facility];  Service: Endoscopy;  Laterality: N/A;    Family History  Problem Relation Age of Onset  . Diabetes Mother   . Hypertension Mother   . Hypertension Father   . Diabetes Father   .  Heart attack      mother, father, brother, sister all deceased due to MI  . Colon cancer Neg Hx   . Liver disease Neg Hx   . Anesthesia problems Neg Hx   . Hypotension Neg Hx   . Malignant hyperthermia Neg Hx   . Pseudochol deficiency Neg Hx     History  Substance Use Topics  . Smoking status: Never Smoker   . Smokeless tobacco: Not on file  . Alcohol Use: No     Review of Systems A complete 10 system review of systems was obtained and all systems are negative except as noted in the HPI and PMH.     Allergies  Gabapentin; Ibuprofen; Naproxen; and Zolpidem tartrate  Home Medications   Current Outpatient Rx  Name  Route  Sig  Dispense  Refill  . albuterol (PROAIR HFA) 108 (90 BASE) MCG/ACT inhaler   Inhalation   Inhale 2 puffs into the lungs every 6 (six)  hours as needed for wheezing (for shortness of breath).         [name] Kitchen albuterol (PROVENTIL) (2.5 MG/3ML) 0.083% nebulizer solution   Nebulization   Take 2.5 mg by nebulization 2 (two) times daily.         [name] Kitchen alprazolam (XANAX) 2 MG tablet   Oral   Take 2 mg by mouth Twice daily.         . ANDROGEL PUMP 1.25 GM/ACT (1%) GEL   Topical   Apply 4 application topically daily. *4 pumps applied to either shoulder daily*           has blood work done every month   . aspirin 325 MG EC tablet   Oral   Take 325 mg by mouth daily.           [name] Kitchen atorvastatin (LIPITOR) 10 MG tablet   Oral   Take 10 mg by mouth every morning.          . beclomethasone (QVAR) 80 MCG/ACT inhaler   Inhalation   Inhale 2 puffs into the lungs 2 (two) times daily.         . cetirizine (ZYRTEC) 10 MG tablet   Oral   Take 10 mg by mouth daily as needed for allergies or rhinitis.          [name] Kitchen esomeprazole (NEXIUM) 40 MG capsule   Oral   Take 40 mg by mouth 2 (two) times daily before lunch and supper.         . furosemide (LASIX) 40 MG tablet   Oral   Take 40 mg by mouth daily.           . hydrochlorothiazide (HYDRODIURIL) 25 MG tablet   Oral   Take 25 mg by mouth daily.         [name] Kitchen ipratropium (ATROVENT) 0.02 % nebulizer solution   Nebulization   Take 500 mcg by nebulization 2 (two) times daily.         [name] Kitchen levETIRAcetam (KEPPRA) 500 MG tablet   Oral   Take 500 mg by mouth every 12 (twelve) hours.           [name] Kitchen lisinopril (PRINIVIL,ZESTRIL) 10 MG tablet   Oral   Take 5 mg by mouth daily. Take half of a tablet         . metoprolol tartrate (LOPRESSOR) 25 MG tablet   Oral   Take 25 mg by mouth 2 (two) times daily.           [name] Kitchen  oxyCODONE-acetaminophen (PERCOCET) 10-325 MG per tablet   Oral   Take 1 tablet by mouth 4 (four) times daily as needed. pain         . sertraline (ZOLOFT) 100 MG tablet   Oral   Take 100 mg by mouth Every morning.         . Tamsulosin HCl (FLOMAX) 0.4  MG CAPS   Oral   Take 0.4 mg by mouth at bedtime.          [name] Kitchen oxyCODONE-acetaminophen (PERCOCET/ROXICET) 5-325 MG per tablet   Oral   Take 2 tablets by mouth every 4 (four) hours as needed for pain.   10 tablet   0   . potassium chloride (K-DUR) 10 MEQ tablet   Oral   Take 2 tablets (20 mEq total) by mouth daily.   5 tablet   0   . EXPIRED: potassium chloride (KLOR-CON) 20 MEQ packet   Oral   Take 20 mEq by mouth 2 (two) times daily.   8 packet   0    BP 133/84  Pulse 70  Temp(Src) 98.1 F (36.7 C) (Oral)  Resp 20  Ht 5\' 10"  (1.778 m)  Wt 250 lb (113.399 kg)  BMI 35.87 kg/m2  SpO2 98%  Physical Exam  Nursing note and vitals reviewed. Constitutional: He appears well-developed and well-nourished. No distress.  HENT:  Head: Normocephalic and atraumatic.  Mouth/Throat: Oropharynx is clear and moist. No oropharyngeal exudate.  Eyes: Conjunctivae and EOM are normal. Pupils are equal, round, and reactive to light. Right eye exhibits no discharge. Left eye exhibits no discharge. No scleral icterus.  Neck: Normal range of motion. Neck supple. No JVD present. No thyromegaly present.  Cardiovascular: Normal rate, regular rhythm, normal heart sounds and intact distal pulses.  Exam reveals no gallop and no friction rub.   No murmur heard. Pulmonary/Chest: Effort normal and breath sounds normal. No respiratory distress. He has no wheezes. He has no rales.  Abdominal: Soft. Bowel sounds are normal. He exhibits no distension and no mass. There is no tenderness.  Musculoskeletal: Normal range of motion. He exhibits no edema and no tenderness.  Lymphadenopathy:    He has no cervical adenopathy.  Neurological: He is alert. Coordination normal.  Skin: Skin is warm and dry. No rash noted. No erythema.  Psychiatric: He has a normal mood and affect. His behavior is normal.    ED Course  Procedures (including critical care time)  DIAGNOSTIC STUDIES: Oxygen Saturation is 98% on room  air, normal by my interpretation.    COORDINATION OF CARE: 8:56 PM-Discussed treatment plan which includes pain medication, CXR, EKG and labs with pt at bedside and pt agreed to plan.     Labs Reviewed  COMPREHENSIVE METABOLIC PANEL - Abnormal; Notable for the following:    Potassium 3.0 (*)    Chloride 95 (*)    GFR calc non Af Amer 66 (*)    GFR calc Af Amer 77 (*)    All other components within normal limits  CBC WITH DIFFERENTIAL  TROPONIN I    Dg Chest 2 View  09/07/2012   *RADIOLOGY REPORT*  Clinical Data: Chest pain, shortness of breath  CHEST - 2 VIEW  Comparison: April 03, 2012.  Findings: Sternotomy wires are noted.  Cardiomediastinal silhouette appears normal.  No acute pulmonary disease is noted.  Bony thorax is intact.  No pleural effusion or pneumothorax is noted.  IMPRESSION: No acute cardiopulmonary abnormality seen.   Original Report Authenticated  By: Lupita Raider.,  M.D.    1. Hypokalemia   2. Myalgia      MDM  The patient is having diffuse bodyaches, headache and really has no focal abnormalities on his exam. His vitals are normal, his EKG is normal and shows no signs of ischemia.   Will obtain a chest x-ray, laboratory workup, Toradol, reevaluate.  Pt is improved after toradol - he has no acute findings on his lab w/u and his CXR - ECG is non ischemic.  VS normal, pt stable for d/c. - now states not using his K, out of his Percocet 10mg  meds.    He appears stable for d/c.   ED ECG REPORT  I personally interpreted this EKG   Date: 09/07/2012   Rate: 75  Rhythm: normal sinus rhythm  QRS Axis: normal  Intervals: normal  ST/T Wave abnormalities: normal  Conduction Disutrbances:none  Narrative Interpretation:   Old EKG Reviewed: Compared with 04/03/2012, no significant changes   Meds given in ED:  Medications  ketorolac (TORADOL) 30 MG/ML injection 30 mg (30 mg Intravenous Given 09/07/12 2112)  potassium chloride SA (K-DUR,KLOR-CON) CR tablet 40  mEq (40 mEq Oral Given 09/07/12 2315)    New Prescriptions   OXYCODONE-ACETAMINOPHEN (PERCOCET/ROXICET) 5-325 MG PER TABLET    Take 2 tablets by mouth every 4 (four) hours as needed for pain.   POTASSIUM CHLORIDE (K-DUR) 10 MEQ TABLET    Take 2 tablets (20 mEq total) by mouth daily.      I personally performed the services described in this documentation, which was scribed in my presence. The recorded information has been reviewed and is accurate.        Vida Roller, MD 09/07/12 [phone]

## 2012-09-07 NOTE — ED Notes (Signed)
Left sided chest pain onset 2 days ago. Now still has left CP and also total body aches

## 2012-10-04 ENCOUNTER — Encounter: Payer: Self-pay | Admitting: Gastroenterology

## 2012-10-05 ENCOUNTER — Ambulatory Visit (INDEPENDENT_AMBULATORY_CARE_PROVIDER_SITE_OTHER): Payer: PRIVATE HEALTH INSURANCE | Admitting: Gastroenterology

## 2012-10-05 ENCOUNTER — Encounter: Payer: Self-pay | Admitting: Gastroenterology

## 2012-10-05 VITALS — BP 131/89 | HR 92 | Temp 97.4°F | Ht 70.0 in | Wt 247.0 lb

## 2012-10-05 DIAGNOSIS — K219 Gastro-esophageal reflux disease without esophagitis: Secondary | ICD-10-CM

## 2012-10-05 DIAGNOSIS — R1314 Dysphagia, pharyngoesophageal phase: Secondary | ICD-10-CM

## 2012-10-05 DIAGNOSIS — A048 Other specified bacterial intestinal infections: Secondary | ICD-10-CM

## 2012-10-05 DIAGNOSIS — K2970 Gastritis, unspecified, without bleeding: Secondary | ICD-10-CM

## 2012-10-05 DIAGNOSIS — R1310 Dysphagia, unspecified: Secondary | ICD-10-CM

## 2012-10-05 NOTE — Progress Notes (Signed)
CC'd to PCP 

## 2012-10-05 NOTE — Assessment & Plan Note (Signed)
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  LOSE 20 LBS LOW FAT DIET NEXIUM BID OPV IN 6 MO. CONSIDER BARIUM PILL ESOPHAGRAM.

## 2012-10-05 NOTE — Assessment & Plan Note (Signed)
SX NOT IDEALLY CONTROLLED. BMI REMAINS > 30.  LOSE 20 LBS LOW FAT DIET NEXIUM BID OPV IN 6 MO.

## 2012-10-05 NOTE — Progress Notes (Signed)
Subjective:    Patient ID: Dalton [name], male    DOB: 11/14/1960, 52 y.o.   MRN: [id number]  Dorrene German, MD  HPI SWALLOWING IS BETTER. MAY FEEL LIKE HE HAS A LUMP IN HIS THROAT. IF HE DRINKS SLOW, FEELS LIKE A LUMP BUILDS UP. HEARTBURN CONTROLLED MOST OF THE TIME. SX: NEXIUM BID-IN BETWEEN GETS KIND OF HEARTBURN-1-3X/WEEK. GAINED 2 LBS SINCE LAST VISIT BUT NOW HAS ON 2 ORTHO BOOTS. CAN'T GET REAL HOT AND START SWEATING. BEEN HAVING TROUBLE WITH KCL. BMs: USU. ONCE A DAY, BUT CAN HAVE CONSTIPATION. USE MG CITRATE. DRINKS GRAPEFRUIT, CRANBERRY, AND OJ.   PT DENIES FEVER, CHILLS, BRBPR, nausea, vomiting, melena, diarrhea, abd pain, OR problems with sedation.  Past Medical History  Diagnosis Date  . Hypertension   . High cholesterol   . Congestive heart failure (CHF)   . Arthritis   . MI (myocardial infarction)     7, last one was in 2011  . PUD (peptic ulcer disease)     in 1990s, secondary to medication  . Laceration of right hand 11/27/10  . Chronic back pain     [facility] in [place]  . Chronic bronchitis   . CHF (congestive heart failure)   . Bronchitis, chronic   . Anemia   . GERD (gastroesophageal reflux disease)   . Frequency of urination   . Seizures     entire life, last seizure in 2011;unknown etiology-pt sts heriditary  . Tonsillitis, chronic     Dr. [name] in [place]  . Shortness of breath     with exertion  . Depression   . [id number](956.2)    Past Surgical History  Procedure Laterality Date  . Carpal tunnel release      bilateral  . Knee surgery      left-plate to left knee cap from accident  . Coronary artery bypass graft  2002    3 vessels  . Back surgery      3-  . Savory dilation  12/28/2010    SLF:(MAC)J-shaped stomach/nodular mocosa in the distal esophagus/empiric dilation 16mm  . Colonoscopy  12/28/10    SLF: (MAC)Internal hemorrhoids/four small colon polyps  . Esophagogastroduodenoscopy N/A 05/30/2012    SLF: UNCONTROLLED GERD  DUE TO LIFESTYLE CHOICE/WEIGHT GAIN/MILD Non-erosive gastritis  . Esophageal biopsy N/A 05/30/2012    Procedure: BIOPSY;  Surgeon: West Bali, MD;  Location: [facility];  Service: Endoscopy;  Laterality: N/A;   Allergies  Allergen Reactions  . Gabapentin Hives  . Ibuprofen Other (See Comments)    REACTION:Stomach  upset  . Naproxen     REACTION: unknown  . Zolpidem Tartrate     REACTION: Hallucinations     Current Outpatient Prescriptions  Medication Sig Dispense Refill  . albuterol (PROAIR HFA) 108 (90 BASE) MCG/ACT inhaler Inhale 2 puffs into the lungs every 6 (six) hours as needed for wheezing (for shortness of breath).      [name] Kitchen albuterol (PROVENTIL) (2.5 MG/3ML) 0.083% nebulizer solution Take 2.5 mg by nebulization 2 (two) times daily.      [name] Kitchen alprazolam (XANAX) 2 MG tablet Take 2 mg by mouth Twice daily.      . ANDROGEL PUMP 1.25 GM/ACT (1%) GEL Apply 4 application topically daily. *4 pumps applied to either shoulder daily*      . aspirin 325 MG EC tablet Take 325 mg by mouth daily.        [name] Kitchen atorvastatin (LIPITOR) 10 MG tablet Take 10 mg by mouth every  morning.       . beclomethasone (QVAR) 80 MCG/ACT inhaler Inhale 2 puffs into the lungs 2 (two) times daily.      . cetirizine (ZYRTEC) 10 MG tablet Take 10 mg by mouth daily as needed for allergies or rhinitis.       [name] Kitchen esomeprazole (NEXIUM) 40 MG capsule Take 40 mg by mouth 2 (two) times daily before lunch and supper.      . furosemide (LASIX) 40 MG tablet Take 40 mg by mouth daily.        . hydrochlorothiazide (HYDRODIURIL) 25 MG tablet Take 25 mg by mouth daily.      [name] Kitchen ipratropium (ATROVENT) 0.02 % nebulizer solution Take 500 mcg by nebulization 2 (two) times daily.      [name] Kitchen levETIRAcetam (KEPPRA) 500 MG tablet Take 500 mg by mouth every 12 (twelve) hours.        [name] Kitchen lisinopril (PRINIVIL,ZESTRIL) 10 MG tablet Take 5 mg by mouth daily. Take half of a tablet      . metoprolol tartrate (LOPRESSOR) 25 MG tablet Take 25 mg by mouth 2 (two)  times daily.        [name] Kitchen oxyCODONE-acetaminophen (PERCOCET) 10-325 MG per tablet Take 1 tablet by mouth 4 (four) times daily as needed. pain      . potassium chloride (K-DUR) 10 MEQ tablet Take 2 tablets (20 mEq total) by mouth daily.    . sertraline (ZOLOFT) 100 MG tablet Take 100 mg by mouth Every morning.    . Tamsulosin HCl (FLOMAX) 0.4 MG CAPS Take 0.4 mg by mouth at bedtime.     .           Review of Systems     Objective:   Physical Exam  Vitals reviewed. Constitutional: He is oriented to person, place, and time. He appears well-nourished. No distress.  HENT:  Head: Normocephalic and atraumatic.  Mouth/Throat: Oropharynx is clear and moist. No oropharyngeal exudate.  Eyes: Pupils are equal, round, and reactive to light. No scleral icterus.  Neck: Normal range of motion. Neck supple.  Cardiovascular: Normal rate, regular rhythm and normal heart sounds.   Pulmonary/Chest: Effort normal and breath sounds normal. No respiratory distress.  Abdominal: Soft. Bowel sounds are normal. He exhibits no distension. There is no tenderness.  Musculoskeletal:  BILATERAL ORTHO BOOTS IN PLACE  Lymphadenopathy:    He has no cervical adenopathy.  Neurological: He is alert and oriented to person, place, and time.  NO  NEW FOCAL DEFICITS   Psychiatric:  FLAT AFFECT, NL MOOD           Assessment & Plan:

## 2012-10-05 NOTE — Assessment & Plan Note (Signed)
ABX COMPLETE.  NEXIUM BID OPV IN 6 MOS

## 2012-10-05 NOTE — Patient Instructions (Signed)
CONTINUE YOUR WEIGHT LOSS EFFORTS. YOU STILL NEED TO LOSE 20 LBS. DO NOT DRINK LIQUID WITH CALORIES.  FOLLOW A HIGH FIBER/LOW FAT DIET. SEE INFO BELOW.  USE FIBER POWDER OR 1 PACKET ONCE DAILY FOR 3 DAYS THEN TWICE DAILY FOR 3 DAYS THEN THREE TIMES A DAY. AVOID HIGHER DOSES IF IT CAUSES BLOATING & GAS.  CONTINUE NEXIUM. TAKE 30 MINUTES BEFORE MEALS TWICE DAILY.  DRINK WATER TO KEEP YOUR URINE LIGHT YELLOW.  FOLLOW UP IN 6 MOS. MERRY CHRISTMAS AND HAPPY NEW YEAR!  Low-Fat Diet BREADS, CEREALS, PASTA, RICE, DRIED PEAS, AND BEANS These products are high in carbohydrates and most are low in fat. Therefore, they can be increased in the diet as substitutes for fatty foods. They too, however, contain calories and should not be eaten in excess. Cereals can be eaten for snacks as well as for breakfast.   FRUITS AND VEGETABLES It is good to eat fruits and vegetables. Besides being sources of fiber, both are rich in vitamins and some minerals. They help you get the daily allowances of these nutrients. Fruits and vegetables can be used for snacks and desserts.  MEATS Limit lean meat, chicken, [place], and fish to no more than 6 ounces per day. Beef, Pork, and Lamb Use lean cuts of beef, pork, and lamb. Lean cuts include:  Extra-lean ground beef.  Arm roast.  Sirloin tip.  Center-cut ham.  Round steak.  Loin chops.  Rump roast.  Tenderloin.  Trim all fat off the outside of meats before cooking. It is not necessary to severely decrease the intake of red meat, but lean choices should be made. Lean meat is rich in protein and contains a highly absorbable form of iron. Premenopausal women, in particular, should avoid reducing lean red meat because this could increase the risk for low red blood cells (iron-deficiency anemia).  Chicken and [place] These are good sources of protein. The fat of poultry can be reduced by removing the skin and underlying fat layers before cooking. Chicken and [place] can be  substituted for lean red meat in the diet. Poultry should not be fried or covered with high-fat sauces. Fish and Shellfish Fish is a good source of protein. Shellfish contain cholesterol, but they usually are low in saturated fatty acids. The preparation of fish is important. Like chicken and [place], they should not be fried or covered with high-fat sauces. EGGS Egg whites contain no fat or cholesterol. They can be eaten often. Try 1 to 2 egg whites instead of whole eggs in recipes or use egg substitutes that do not contain yolk. MILK AND DAIRY PRODUCTS Use skim or 1% milk instead of 2% or whole milk. Decrease whole milk, natural, and processed cheeses. Use nonfat or low-fat (2%) cottage cheese or low-fat cheeses made from vegetable oils. Choose nonfat or low-fat (1 to 2%) yogurt. Experiment with evaporated skim milk in recipes that call for heavy cream. Substitute low-fat yogurt or low-fat cottage cheese for sour cream in dips and salad dressings. Have at least 2 servings of low-fat dairy products, such as 2 glasses of skim (or 1%) milk each day to help get your daily calcium intake. FATS AND OILS Reduce the total intake of fats, especially saturated fat. Butterfat, lard, and beef fats are high in saturated fat and cholesterol. These should be avoided as much as possible. Vegetable fats do not contain cholesterol, but certain vegetable fats, such as coconut oil, palm oil, and palm kernel oil are very high in saturated  fats. These should be limited. These fats are often used in bakery goods, processed foods, popcorn, oils, and nondairy creamers. Vegetable shortenings and some peanut butters contain hydrogenated oils, which are also saturated fats. Read the labels on these foods and check for saturated vegetable oils. Unsaturated vegetable oils and fats do not raise blood cholesterol. However, they should be limited because they are fats and are high in calories. Total fat should still be limited to 30% of  your daily caloric intake. Desirable liquid vegetable oils are corn oil, cottonseed oil, olive oil, canola oil, safflower oil, soybean oil, and sunflower oil. Peanut oil is not as good, but small amounts are acceptable. Buy a heart-healthy tub margarine that has no partially hydrogenated oils in the ingredients. Mayonnaise and salad dressings often are made from unsaturated fats, but they should also be limited because of their high calorie and fat content. Seeds, nuts, peanut butter, olives, and avocados are high in fat, but the fat is mainly the unsaturated type. These foods should be limited mainly to avoid excess calories and fat. OTHER EATING TIPS Snacks  Most sweets should be limited as snacks. They tend to be rich in calories and fats, and their caloric content outweighs their nutritional value. Some good choices in snacks are graham crackers, melba toast, soda crackers, bagels (no egg), English muffins, fruits, and vegetables. These snacks are preferable to snack crackers, [place] fries, TORTILLA CHIPS, and POTATO chips. Popcorn should be air-popped or cooked in small amounts of liquid vegetable oil. Desserts Eat fruit, low-fat yogurt, and fruit ices instead of pastries, cake, and cookies. Sherbet, angel food cake, gelatin dessert, frozen low-fat yogurt, or other frozen products that do not contain saturated fat (pure fruit juice bars, frozen ice pops) are also acceptable.  COOKING METHODS Choose those methods that use little or no fat. They include: Poaching.  Braising.  Steaming.  Grilling.  Baking.  Stir-frying.  Broiling.  Microwaving.  Foods can be cooked in a nonstick pan without added fat, or use a nonfat cooking spray in regular cookware. Limit fried foods and avoid frying in saturated fat. Add moisture to lean meats by using water, broth, cooking wines, and other nonfat or low-fat sauces along with the cooking methods mentioned above. Soups and stews should be chilled after cooking.  The fat that forms on top after a few hours in the refrigerator should be skimmed off. When preparing meals, avoid using excess salt. Salt can contribute to raising blood pressure in some people.  EATING AWAY FROM HOME Order entres, potatoes, and vegetables without sauces or butter. When meat exceeds the size of a deck of cards (3 to 4 ounces), the rest can be taken home for another meal. Choose vegetable or fruit salads and ask for low-calorie salad dressings to be served on the side. Use dressings sparingly. Limit high-fat toppings, such as bacon, crumbled eggs, cheese, sunflower seeds, and olives. Ask for heart-healthy tub margarine instead of butter.  High-Fiber Diet A high-fiber diet changes your normal diet to include more whole grains, legumes, fruits, and vegetables. Changes in the diet involve replacing refined carbohydrates with unrefined foods. The calorie level of the diet is essentially unchanged. The Dietary Reference Intake (recommended amount) for adult males is 38 grams per day. For adult females, it is 25 grams per day. Pregnant and lactating women should consume 28 grams of fiber per day. Fiber is the intact part of a plant that is not broken down during digestion. Functional fiber is fiber  that has been isolated from the plant to provide a beneficial effect in the body. PURPOSE  Increase stool bulk.   Ease and regulate bowel movements.   Lower cholesterol.  INDICATIONS THAT YOU NEED MORE FIBER  Constipation and hemorrhoids.   Uncomplicated diverticulosis (intestine condition) and irritable bowel syndrome.   Weight management.   As a protective measure against hardening of the arteries (atherosclerosis), diabetes, and cancer.   GUIDELINES FOR INCREASING FIBER IN THE DIET  Start adding fiber to the diet slowly. A gradual increase of about 5 more grams (2 slices of whole-wheat bread, 2 servings of most fruits or vegetables, or 1 bowl of high-fiber cereal) per day is  best. Too rapid an increase in fiber may result in constipation, flatulence, and bloating.   Drink enough water and fluids to keep your urine clear or pale yellow. Water, juice, or caffeine-free drinks are recommended. Not drinking enough fluid may cause constipation.   Eat a variety of high-fiber foods rather than one type of fiber.   Try to increase your intake of fiber through using high-fiber foods rather than fiber pills or supplements that contain small amounts of fiber.   The goal is to change the types of food eaten. Do not supplement your present diet with high-fiber foods, but replace foods in your present diet.  INCLUDE A VARIETY OF FIBER SOURCES  Replace refined and processed grains with whole grains, canned fruits with fresh fruits, and incorporate other fiber sources. White rice, white breads, and most bakery goods contain little or no fiber.   Brown whole-grain rice, buckwheat oats, and many fruits and vegetables are all good sources of fiber. These include: broccoli, Brussels sprouts, cabbage, cauliflower, beets, sweet potatoes, white potatoes (skin on), carrots, tomatoes, eggplant, squash, berries, fresh fruits, and dried fruits.   Cereals appear to be the richest source of fiber. Cereal fiber is found in whole grains and bran. Bran is the fiber-rich outer coat of cereal grain, which is largely removed in refining. In whole-grain cereals, the bran remains. In breakfast cereals, the largest amount of fiber is found in those with "bran" in their names. The fiber content is sometimes indicated on the label.   You may need to include additional fruits and vegetables each day.   In baking, for 1 cup white flour, you may use the following substitutions:   1 cup whole-wheat flour minus 2 tablespoons.   1/2 cup white flour plus 1/2 cup whole-wheat flour.

## 2012-10-05 NOTE — Progress Notes (Signed)
Reminder in epic °

## 2013-02-06 ENCOUNTER — Emergency Department (HOSPITAL_COMMUNITY)
Admission: EM | Admit: 2013-02-06 | Discharge: 2013-02-06 | Discharge status: Against Medical Advice | Payer: PRIVATE HEALTH INSURANCE | Attending: Emergency Medicine | Admitting: Emergency Medicine

## 2013-02-06 ENCOUNTER — Encounter (HOSPITAL_COMMUNITY): Payer: Self-pay | Admitting: Emergency Medicine

## 2013-02-06 DIAGNOSIS — R52 Pain, unspecified: Secondary | ICD-10-CM | POA: Insufficient documentation

## 2013-02-06 DIAGNOSIS — I10 Essential (primary) hypertension: Secondary | ICD-10-CM | POA: Insufficient documentation

## 2013-02-06 DIAGNOSIS — R51 Headache: Secondary | ICD-10-CM | POA: Insufficient documentation

## 2013-02-06 DIAGNOSIS — R1110 Vomiting, unspecified: Secondary | ICD-10-CM | POA: Insufficient documentation

## 2013-02-06 DIAGNOSIS — I509 Heart failure, unspecified: Secondary | ICD-10-CM | POA: Insufficient documentation

## 2013-02-06 DIAGNOSIS — R109 Unspecified abdominal pain: Secondary | ICD-10-CM | POA: Insufficient documentation

## 2013-02-06 NOTE — ED Notes (Signed)
Pt c/o abd pain/n/v/cough/body aches x 3 days.

## 2013-05-05 ENCOUNTER — Other Ambulatory Visit: Payer: Self-pay | Admitting: Gastroenterology

## 2013-07-19 ENCOUNTER — Other Ambulatory Visit: Payer: Self-pay | Admitting: Internal Medicine

## 2013-07-19 DIAGNOSIS — R51 Headache: Secondary | ICD-10-CM

## 2013-07-19 DIAGNOSIS — R0609 Other forms of dyspnea: Secondary | ICD-10-CM

## 2013-07-19 DIAGNOSIS — R0989 Other specified symptoms and signs involving the circulatory and respiratory systems: Secondary | ICD-10-CM

## 2013-07-26 ENCOUNTER — Ambulatory Visit
Admission: RE | Admit: 2013-07-26 | Discharge: 2013-07-26 | Disposition: A | Discharge status: Home / Self Care | Payer: PRIVATE HEALTH INSURANCE | Source: Ambulatory Visit | Attending: Internal Medicine | Admitting: Internal Medicine

## 2013-07-26 DIAGNOSIS — R0989 Other specified symptoms and signs involving the circulatory and respiratory systems: Secondary | ICD-10-CM

## 2013-07-26 DIAGNOSIS — R0609 Other forms of dyspnea: Secondary | ICD-10-CM

## 2013-07-26 DIAGNOSIS — R51 Headache: Secondary | ICD-10-CM

## 2013-07-26 MED ORDER — IOHEXOL 300 MG/ML  SOLN
75.0000 mL | Freq: Once | INTRAMUSCULAR | Status: AC | PRN
  Administered 2013-07-26: 75 mL via INTRAVENOUS

## 2013-08-02 ENCOUNTER — Telehealth: Payer: Self-pay

## 2013-08-02 NOTE — Telephone Encounter (Signed)
Pt called- he would like an office visit scheduled. He is having some problems with his swallowing again. Per slf last ov note pt was to return in 6 months. I dont see where he was scheduled for that, looks like he was sent a letter but never responded.   Manuela Schwartz, please schedule.

## 2013-08-05 ENCOUNTER — Encounter: Payer: Self-pay | Admitting: Gastroenterology

## 2013-08-05 NOTE — Telephone Encounter (Signed)
Pt is aware of OV with [facility] on 7/23 at 9am and appt card mailed

## 2013-08-10 ENCOUNTER — Emergency Department (HOSPITAL_COMMUNITY)
Admission: EM | Admit: 2013-08-10 | Discharge: 2013-08-10 | Disposition: A | Discharge status: Home / Self Care | Payer: PRIVATE HEALTH INSURANCE | Attending: Emergency Medicine | Admitting: Emergency Medicine

## 2013-08-10 ENCOUNTER — Emergency Department (HOSPITAL_COMMUNITY): Payer: PRIVATE HEALTH INSURANCE

## 2013-08-10 ENCOUNTER — Encounter (HOSPITAL_COMMUNITY): Payer: Self-pay | Admitting: Emergency Medicine

## 2013-08-10 DIAGNOSIS — M129 Arthropathy, unspecified: Secondary | ICD-10-CM | POA: Diagnosis not present

## 2013-08-10 DIAGNOSIS — Z8709 Personal history of other diseases of the respiratory system: Secondary | ICD-10-CM | POA: Diagnosis not present

## 2013-08-10 DIAGNOSIS — Z87828 Personal history of other (healed) physical injury and trauma: Secondary | ICD-10-CM | POA: Insufficient documentation

## 2013-08-10 DIAGNOSIS — Z79899 Other long term (current) drug therapy: Secondary | ICD-10-CM | POA: Insufficient documentation

## 2013-08-10 DIAGNOSIS — F329 Major depressive disorder, single episode, unspecified: Secondary | ICD-10-CM | POA: Insufficient documentation

## 2013-08-10 DIAGNOSIS — I252 Old myocardial infarction: Secondary | ICD-10-CM | POA: Diagnosis not present

## 2013-08-10 DIAGNOSIS — Z7982 Long term (current) use of aspirin: Secondary | ICD-10-CM | POA: Diagnosis not present

## 2013-08-10 DIAGNOSIS — L03039 Cellulitis of unspecified toe: Principal | ICD-10-CM | POA: Insufficient documentation

## 2013-08-10 DIAGNOSIS — K219 Gastro-esophageal reflux disease without esophagitis: Secondary | ICD-10-CM | POA: Diagnosis not present

## 2013-08-10 DIAGNOSIS — I10 Essential (primary) hypertension: Secondary | ICD-10-CM | POA: Insufficient documentation

## 2013-08-10 DIAGNOSIS — G8929 Other chronic pain: Secondary | ICD-10-CM | POA: Diagnosis not present

## 2013-08-10 DIAGNOSIS — IMO0002 Reserved for concepts with insufficient information to code with codable children: Secondary | ICD-10-CM | POA: Diagnosis not present

## 2013-08-10 DIAGNOSIS — G40909 Epilepsy, unspecified, not intractable, without status epilepticus: Secondary | ICD-10-CM | POA: Diagnosis not present

## 2013-08-10 DIAGNOSIS — L089 Local infection of the skin and subcutaneous tissue, unspecified: Secondary | ICD-10-CM

## 2013-08-10 DIAGNOSIS — I509 Heart failure, unspecified: Secondary | ICD-10-CM | POA: Insufficient documentation

## 2013-08-10 DIAGNOSIS — Z862 Personal history of diseases of the blood and blood-forming organs and certain disorders involving the immune mechanism: Secondary | ICD-10-CM | POA: Insufficient documentation

## 2013-08-10 DIAGNOSIS — Z951 Presence of aortocoronary bypass graft: Secondary | ICD-10-CM | POA: Insufficient documentation

## 2013-08-10 DIAGNOSIS — L02619 Cutaneous abscess of unspecified foot: Secondary | ICD-10-CM | POA: Insufficient documentation

## 2013-08-10 DIAGNOSIS — F3289 Other specified depressive episodes: Secondary | ICD-10-CM | POA: Insufficient documentation

## 2013-08-10 DIAGNOSIS — Z87448 Personal history of other diseases of urinary system: Secondary | ICD-10-CM | POA: Diagnosis not present

## 2013-08-10 DIAGNOSIS — Z4800 Encounter for change or removal of nonsurgical wound dressing: Secondary | ICD-10-CM | POA: Diagnosis present

## 2013-08-10 MED ORDER — OXYCODONE-ACETAMINOPHEN 5-325 MG PO TABS: 1.0000 | ORAL_TABLET | ORAL | Status: DC | PRN

## 2013-08-10 MED ORDER — OXYCODONE-ACETAMINOPHEN 5-325 MG PO TABS
1.0000 | ORAL_TABLET | Freq: Once | ORAL | Status: AC
  Administered 2013-08-10: 1 via ORAL
  Filled 2013-08-10: qty 1

## 2013-08-10 MED ORDER — CEPHALEXIN 500 MG PO CAPS: 500.0000 mg | ORAL_CAPSULE | Freq: Four times a day (QID) | ORAL | Status: DC

## 2013-08-10 NOTE — ED Notes (Signed)
Pt reports had toenail removed last Wednesday, pt reports thinks may be infected.

## 2013-08-10 NOTE — Discharge Instructions (Signed)
Take the medication as directed. Do not take the narcotic if you are driving as it will make you sleepy.

## 2013-08-10 NOTE — ED Notes (Signed)
Rt great toenail removed 9 days ago, by MD in [place]. Area is painful and swollen, Pt is concerned about possible infection.

## 2013-08-10 NOTE — ED Provider Notes (Signed)
CSN: [id number]     Arrival date & time 08/10/13  1351 History   First MD Initiated Contact with Patient 08/10/13 1418     Chief Complaint  Patient presents with  . Wound Check     (Consider location/radiation/quality/duration/timing/severity/associated sxs/prior Treatment) Patient is a 53 y.o. male presenting with toe pain. The history is provided by the patient.  Toe Pain This is a new problem. The current episode started in the past 7 days. The problem occurs constantly. The problem has been gradually worsening. The symptoms are aggravated by walking. He has tried nothing for the symptoms.   Dalton [name] is a 53 y.o. male who presents to the ED with right great toe pain. He had his toe nail removed by the "foot doctor" in [place] 9 days ago. He was doing well until a few days ago when he noted swelling, pain and redness. The symptoms have gotten worse. He is scheduled to see his doctor for follow up next week.   Past Medical History  Diagnosis Date  . Hypertension   . High cholesterol   . Congestive heart failure (CHF)   . Arthritis   . MI (myocardial infarction)     7, last one was in 2011  . PUD (peptic ulcer disease)     in 1990s, secondary to medication  . Laceration of right hand 11/27/10  . Chronic back pain     [facility] in [place]  . Chronic bronchitis   . CHF (congestive heart failure)   . Bronchitis, chronic   . Anemia   . GERD (gastroesophageal reflux disease)   . Frequency of urination   . Seizures     entire life, last seizure in 2011;unknown etiology-pt sts heriditary  . Tonsillitis, chronic     Dr. Vicki Mallet in [place]  . Shortness of breath     with exertion  . Depression   . Headache(784.0)   . Laceration of wrist 2007 BIL FOREARMS   Past Surgical History  Procedure Laterality Date  . Carpal tunnel release      bilateral  . Knee surgery      left-plate to left knee cap from accident  . Coronary artery bypass graft  2002    3  vessels  . Back surgery      3-  . Savory dilation  12/28/2010    SLF:(MAC)J-shaped stomach/nodular mocosa in the distal esophagus/empiric dilation 72mm  . Colonoscopy  12/28/10    SLF: (MAC)Internal hemorrhoids/four small colon polyps  . Esophagogastroduodenoscopy N/A 05/30/2012    SLF: UNCONTROLLED GERD DUE TO LIFESTYLE CHOICE/WEIGHT GAIN/MILD Non-erosive gastritis  . Esophageal biopsy N/A 05/30/2012    Procedure: BIOPSY;  Surgeon: Danie Binder, MD;  Location: [facility];  Service: Endoscopy;  Laterality: N/A;   Family History  Problem Relation Age of Onset  . Diabetes Mother   . Hypertension Mother   . Hypertension Father   . Diabetes Father   . Heart attack      mother, father, brother, sister all deceased due to MI  . Colon cancer Neg Hx   . Liver disease Neg Hx   . Anesthesia problems Neg Hx   . Hypotension Neg Hx   . Malignant hyperthermia Neg Hx   . Pseudochol deficiency Neg Hx   . Colon polyps Neg Hx    History  Substance Use Topics  . Smoking status: Never Smoker   . Smokeless tobacco: Not on file  . Alcohol Use: No  Review of Systems Negative except as stated in HPI   Allergies  Gabapentin; Ibuprofen; Naproxen; and Zolpidem tartrate  Home Medications   Prior to Admission medications   Medication Sig Start Date End Date Taking? Authorizing Provider  albuterol (PROAIR HFA) 108 (90 BASE) MCG/ACT inhaler Inhale 2 puffs into the lungs every 6 (six) hours as needed for wheezing (for shortness of breath).    Historical Provider, MD  albuterol (PROVENTIL) (2.5 MG/3ML) 0.083% nebulizer solution Take 2.5 mg by nebulization 2 (two) times daily.    Historical Provider, MD  alprazolam Duanne Moron) 2 MG tablet Take 2 mg by mouth Twice daily. 03/15/11   Historical Provider, MD  ANDROGEL PUMP 1.25 GM/ACT (1%) GEL Apply 4 application topically daily. *4 pumps applied to either shoulder daily* 03/24/11   Historical Provider, MD  aspirin 325 MG EC tablet Take 325 mg by mouth daily.       Historical Provider, MD  atorvastatin (LIPITOR) 10 MG tablet Take 10 mg by mouth every morning.     Historical Provider, MD  beclomethasone (QVAR) 80 MCG/ACT inhaler Inhale 2 puffs into the lungs 2 (two) times daily.    Historical Provider, MD  cetirizine (ZYRTEC) 10 MG tablet Take 10 mg by mouth daily as needed for allergies or rhinitis.     Historical Provider, MD  furosemide (LASIX) 40 MG tablet Take 40 mg by mouth daily.      Historical Provider, MD  hydrochlorothiazide (HYDRODIURIL) 25 MG tablet Take 25 mg by mouth daily.    Historical Provider, MD  ipratropium (ATROVENT) 0.02 % nebulizer solution Take 500 mcg by nebulization 2 (two) times daily.    Historical Provider, MD  levETIRAcetam (KEPPRA) 500 MG tablet Take 500 mg by mouth every 12 (twelve) hours.      Historical Provider, MD  lisinopril (PRINIVIL,ZESTRIL) 10 MG tablet Take 5 mg by mouth daily. Take half of a tablet    Historical Provider, MD  metoprolol tartrate (LOPRESSOR) 25 MG tablet Take 25 mg by mouth 2 (two) times daily.      Historical Provider, MD  NEXIUM 40 MG capsule take 1 capsule 30 MINS PRIOR TO BREAKFAST AND SUPPER.    Orvil Feil, NP  oxyCODONE-acetaminophen (PERCOCET) 10-325 MG per tablet Take 1 tablet by mouth 4 (four) times daily as needed. pain    Historical Provider, MD  potassium chloride (K-DUR) 10 MEQ tablet Take 2 tablets (20 mEq total) by mouth daily. 09/07/12   Johnna Acosta, MD  potassium chloride (KLOR-CON) 20 MEQ packet Take 20 mEq by mouth 2 (two) times daily. 05/18/11 05/17/12  Janice Norrie, MD  sertraline (ZOLOFT) 100 MG tablet Take 100 mg by mouth Every morning. 03/15/11   Historical Provider, MD  Tamsulosin HCl (FLOMAX) 0.4 MG CAPS Take 0.4 mg by mouth at bedtime.     Historical Provider, MD   BP 147/93  Pulse 67  Temp(Src) 98 F (36.7 C) (Oral)  Resp 20  Ht 5\' 10"  (1.778 m)  Wt 225 lb (102.059 kg)  BMI 32.28 kg/m2  SpO2 98% Physical Exam  Nursing note and vitals reviewed. Constitutional: He is  oriented to person, place, and time. He appears well-developed and well-nourished. No distress.  HENT:  Head: Normocephalic.  Eyes: EOM are normal.  Neck: Neck supple.  Cardiovascular: Normal rate.   Pulmonary/Chest: Effort normal.  Abdominal: Soft.  Musculoskeletal: Normal range of motion.       Right foot: He exhibits tenderness and swelling.  Feet:  Swelling, erythema and tenderness of the right great toe. Nail has been surgically removed.   Neurological: He is alert and oriented to person, place, and time. No cranial nerve deficit.  Skin: Skin is warm and dry.  Psychiatric: He has a normal mood and affect. His behavior is normal.    ED Course: Dr. Lacinda Axon in to examine the patient.   Procedures Dg Toe Great Right  08/10/2013   CLINICAL DATA:  Inflammatory changes at the site of the great toe nail bed; history of diabetes  EXAM: RIGHT GREAT TOE  COMPARISON:  None.  FINDINGS: The bones of the great toe or adequately mineralized. There is no lytic or blastic lesion. There is faint radiodensity within the soft tissues of the medial aspect of the distal phalanx which may be related to the nail bed. There is no soft tissue gas.  IMPRESSION: There is no evidence of osteomyelitis. There is mild soft tissue swelling.   Electronically Signed   By: David  [place]   On: 08/10/2013 14:57    MDM  53 y.o. male with swelling and pain to the right great toe s/p surgical removal of the toe nail 9 days ago. Will treat for infection and pain. Patient to follow up with his surgeon early next week or return here as needed for worsening symptoms. Discussed with the patient and all questioned fully answered.   Medication List    TAKE these medications       cephALEXin 500 MG capsule  Commonly known as:  KEFLEX  Take 1 capsule (500 mg total) by mouth 4 (four) times daily.      ASK your doctor about these medications       alprazolam 2 MG tablet  Commonly known as:  XANAX  Take 2 mg by mouth Twice  daily.     ANDROGEL PUMP 12.5 MG/ACT (1%) Gel  Generic drug:  Testosterone  Apply 4 application topically daily. *4 pumps applied to either shoulder daily*     aspirin 325 MG EC tablet  Take 325 mg by mouth daily.     atorvastatin 10 MG tablet  Commonly known as:  LIPITOR  Take 10 mg by mouth every morning.     beclomethasone 80 MCG/ACT inhaler  Commonly known as:  QVAR  Inhale 2 puffs into the lungs 2 (two) times daily.     cetirizine 10 MG tablet  Commonly known as:  ZYRTEC  Take 10 mg by mouth daily as needed for allergies or rhinitis.     furosemide 40 MG tablet  Commonly known as:  LASIX  Take 40 mg by mouth daily.     hydrochlorothiazide 25 MG tablet  Commonly known as:  HYDRODIURIL  Take 25 mg by mouth daily.     ipratropium 0.02 % nebulizer solution  Commonly known as:  ATROVENT  Take 500 mcg by nebulization 2 (two) times daily.     levETIRAcetam 500 MG tablet  Commonly known as:  KEPPRA  Take 500 mg by mouth every 12 (twelve) hours.     lisinopril 10 MG tablet  Commonly known as:  PRINIVIL,ZESTRIL  Take 5 mg by mouth daily. Take half of a tablet     metoprolol tartrate 25 MG tablet  Commonly known as:  LOPRESSOR  Take 25 mg by mouth 2 (two) times daily.     NEXIUM 40 MG capsule  Generic drug:  esomeprazole  take 1 capsule 30 MINS PRIOR TO BREAKFAST AND SUPPER.  oxyCODONE-acetaminophen 10-325 MG per tablet  Commonly known as:  PERCOCET  Take 1 tablet by mouth 4 (four) times daily as needed. pain  Ask about: Which instructions should I use?     oxyCODONE-acetaminophen 5-325 MG per tablet  Commonly known as:  ROXICET  Take 1 tablet by mouth every 4 (four) hours as needed for severe pain.  Ask about: Which instructions should I use?     potassium chloride 20 MEQ packet  Commonly known as:  KLOR-CON  Take 20 mEq by mouth 2 (two) times daily.     potassium chloride 10 MEQ tablet  Commonly known as:  K-DUR  Take 2 tablets (20 mEq total) by mouth  daily.     PROAIR HFA 108 (90 BASE) MCG/ACT inhaler  Generic drug:  albuterol  Inhale 2 puffs into the lungs every 6 (six) hours as needed for wheezing (for shortness of breath).     albuterol (2.5 MG/3ML) 0.083% nebulizer solution  Commonly known as:  PROVENTIL  Take 2.5 mg by nebulization 2 (two) times daily.     sertraline 100 MG tablet  Commonly known as:  ZOLOFT  Take 100 mg by mouth Every morning.     tamsulosin 0.4 MG Caps capsule  Commonly known as:  FLOMAX  Take 0.4 mg by mouth at bedtime.          [place], [place] 08/10/13 [phone]

## 2013-08-11 NOTE — ED Provider Notes (Signed)
Medical screening examination/treatment/procedure(s) were conducted as a shared visit with non-physician practitioner(s) and myself.  I personally evaluated the patient during the encounter.   EKG Interpretation None     Status post nail removal right great toe recently. Now with minor cellulitis surrounding the surgical site. X-ray shows no osteomyelitis.  Rx Keflex  Nat Christen, MD 08/11/13 [phone]

## 2013-09-03 ENCOUNTER — Emergency Department (HOSPITAL_COMMUNITY): Payer: PRIVATE HEALTH INSURANCE

## 2013-09-03 ENCOUNTER — Emergency Department (HOSPITAL_COMMUNITY)
Admission: EM | Admit: 2013-09-03 | Discharge: 2013-09-03 | Disposition: A | Discharge status: Home / Self Care | Payer: PRIVATE HEALTH INSURANCE | Attending: Emergency Medicine | Admitting: Emergency Medicine

## 2013-09-03 ENCOUNTER — Encounter (HOSPITAL_COMMUNITY): Payer: Self-pay | Admitting: Emergency Medicine

## 2013-09-03 DIAGNOSIS — G40909 Epilepsy, unspecified, not intractable, without status epilepticus: Secondary | ICD-10-CM | POA: Diagnosis not present

## 2013-09-03 DIAGNOSIS — R600 Localized edema: Secondary | ICD-10-CM

## 2013-09-03 DIAGNOSIS — I509 Heart failure, unspecified: Secondary | ICD-10-CM | POA: Insufficient documentation

## 2013-09-03 DIAGNOSIS — M129 Arthropathy, unspecified: Secondary | ICD-10-CM | POA: Diagnosis not present

## 2013-09-03 DIAGNOSIS — Z7982 Long term (current) use of aspirin: Secondary | ICD-10-CM | POA: Insufficient documentation

## 2013-09-03 DIAGNOSIS — Z951 Presence of aortocoronary bypass graft: Secondary | ICD-10-CM | POA: Insufficient documentation

## 2013-09-03 DIAGNOSIS — IMO0002 Reserved for concepts with insufficient information to code with codable children: Secondary | ICD-10-CM | POA: Diagnosis not present

## 2013-09-03 DIAGNOSIS — R609 Edema, unspecified: Secondary | ICD-10-CM | POA: Insufficient documentation

## 2013-09-03 DIAGNOSIS — Z79899 Other long term (current) drug therapy: Secondary | ICD-10-CM | POA: Diagnosis not present

## 2013-09-03 DIAGNOSIS — I10 Essential (primary) hypertension: Secondary | ICD-10-CM | POA: Diagnosis not present

## 2013-09-03 DIAGNOSIS — I252 Old myocardial infarction: Secondary | ICD-10-CM | POA: Insufficient documentation

## 2013-09-03 DIAGNOSIS — G8929 Other chronic pain: Secondary | ICD-10-CM | POA: Diagnosis not present

## 2013-09-03 DIAGNOSIS — Z862 Personal history of diseases of the blood and blood-forming organs and certain disorders involving the immune mechanism: Secondary | ICD-10-CM | POA: Diagnosis not present

## 2013-09-03 DIAGNOSIS — Z8719 Personal history of other diseases of the digestive system: Secondary | ICD-10-CM | POA: Diagnosis not present

## 2013-09-03 DIAGNOSIS — Z8709 Personal history of other diseases of the respiratory system: Secondary | ICD-10-CM | POA: Insufficient documentation

## 2013-09-03 DIAGNOSIS — I2510 Atherosclerotic heart disease of native coronary artery without angina pectoris: Secondary | ICD-10-CM | POA: Insufficient documentation

## 2013-09-03 HISTORY — DX: Atherosclerotic heart disease of native coronary artery without angina pectoris: I25.10

## 2013-09-03 LAB — CBC WITH DIFFERENTIAL/PLATELET
BASOS ABS: 0 10*3/uL (ref 0.0–0.1)
BASOS PCT: 0 % (ref 0–1)
EOS ABS: 0.1 10*3/uL (ref 0.0–0.7)
EOS PCT: 1 % (ref 0–5)
HEMATOCRIT: 37.8 % — AB (ref 39.0–52.0)
Hemoglobin: 13.1 g/dL (ref 13.0–17.0)
Lymphocytes Relative: 34 % (ref 12–46)
Lymphs Abs: 2.1 10*3/uL (ref 0.7–4.0)
MCH: 30.7 pg (ref 26.0–34.0)
MCHC: 34.7 g/dL (ref 30.0–36.0)
MCV: 88.5 fL (ref 78.0–100.0)
MONO ABS: 0.5 10*3/uL (ref 0.1–1.0)
Monocytes Relative: 8 % (ref 3–12)
Neutro Abs: 3.6 10*3/uL (ref 1.7–7.7)
Neutrophils Relative %: 57 % (ref 43–77)
PLATELETS: 182 10*3/uL (ref 150–400)
RBC: 4.27 MIL/uL (ref 4.22–5.81)
RDW: 14.4 % (ref 11.5–15.5)
WBC: 6.3 10*3/uL (ref 4.0–10.5)

## 2013-09-03 LAB — COMPREHENSIVE METABOLIC PANEL
ALBUMIN: 3.8 g/dL (ref 3.5–5.2)
ALT: 25 U/L (ref 0–53)
AST: 23 U/L (ref 0–37)
Alkaline Phosphatase: 60 U/L (ref 39–117)
Anion gap: 10 (ref 5–15)
BUN: 8 mg/dL (ref 6–23)
CALCIUM: 9.2 mg/dL (ref 8.4–10.5)
CO2: 29 mEq/L (ref 19–32)
CREATININE: 1.03 mg/dL (ref 0.50–1.35)
Chloride: 105 mEq/L (ref 96–112)
GFR calc Af Amer: >90 mL/min (ref >90)
GFR, EST NON AFRICAN AMERICAN: 82 mL/min — AB (ref >90)
Glucose, Bld: 86 mg/dL (ref 70–99)
Potassium: 3.9 mEq/L (ref 3.7–5.3)
Sodium: 144 mEq/L (ref 137–147)
TOTAL PROTEIN: 7.3 g/dL (ref 6.0–8.3)
Total Bilirubin: 1 mg/dL (ref 0.3–1.2)

## 2013-09-03 MED ORDER — CLINDAMYCIN HCL 150 MG PO CAPS: ORAL_CAPSULE | ORAL | Status: DC

## 2013-09-03 NOTE — Discharge Instructions (Signed)
°Emergency Department Resource Guide °1) Find a Doctor and Pay Out of Pocket °Although you won't have to find out who is covered by your insurance plan, it is a good idea to ask around and get recommendations. You will then need to call the office and see if the doctor you have chosen will accept you as a new patient and what types of options they offer for patients who are self-pay. Some doctors offer discounts or will set up payment plans for their patients who do not have insurance, but you will need to ask so you aren't surprised when you get to your appointment. ° °2) Contact Your Local Health Department °Not all health departments have doctors that can see patients for sick visits, but many do, so it is worth a call to see if yours does. If you don't know where your local health department is, you can check in your phone book. The CDC also has a tool to help you locate your state's health department, and many state websites also have listings of all of their local health departments. ° °3) Find a Walk-in Clinic °If your illness is not likely to be very severe or complicated, you may want to try a walk in clinic. These are popping up all over the country in pharmacies, drugstores, and shopping centers. They're usually staffed by nurse practitioners or physician assistants that have been trained to treat common illnesses and complaints. They're usually fairly quick and inexpensive. However, if you have serious medical issues or chronic medical problems, these are probably not your best option. ° °No Primary Care Doctor: °- Call [facility] at  832-8000 - they can help you locate a primary care doctor that  accepts your insurance, provides certain services, etc. °- Physician Referral Service- 1-800-533-3463 ° °Chronic Pain Problems: °Organization         Address  Phone   Notes  °[facility]  (336) 297-2271 Patients need to be referred by their primary care doctor.  ° °Medication  Assistance: °Organization         Address  Phone   Notes  °[place] [facility] [address] °[place], [place] [address] (336) 641-8030 --Must be a resident of [place] °-- Must have NO insurance coverage whatsoever (no Medicaid/ Medicare, etc.) °-- The pt. MUST have a primary care doctor that directs their care regularly and follows them in the community °  °[facility]  (866) 331-1348   °[employer]  (888) 892-1162   ° °Agencies that provide inexpensive medical care: °Organization         Address  Phone   Notes  °[facility]  (336) 832-8035   °[facility]    (336) 832-7272   °[facility] [address] °[place], [place] [address] (336) 832-4777   °[facility] [address], °[place] (336) 271-4999   °[facility]    (336) 373-0678   °[facility]    (336) 272-1050   °[facility] ° [address], [place] Phone:  (336) 832-4444, Fax:  (336) 832-4440 Hours of Operation:  9 am - 6 pm, M-F.  Also accepts Medicaid/Medicare and self-pay.  °[facility] ° [address], [place] Phone: (336) 832-3150, Fax: (336) 832-3151. Hours of Operation:  8:30 am - 5:30 pm, M-F.  Also accepts Medicaid and self-pay.  °[facility] [place] [address], [place] Phone: (336) 878-6027   °[facility] [address], [place], [place] (336)723-1848, Ext. 123 Mondays & Thursdays: 7-9 AM.  First 15 patients are seen on a first come, first serve basis. °  ° °Medicaid-accepting [place] Providers: ° °Organization         Address  Phone   Notes  °[facility] [address], [place] (336) 641-2100 Also accepts self-pay patients.  °[facility] [address], [place] ° (336) 856-9996   °[facility] [address], [place]  (336) 288-8857   °[facility] [address] [place] [address], [place] (336) 299-7000   °Veita Bland [address], [place]  ° (336) 373-1557 Only accepts [place] Access Medicaid patients after they have their name applied to their card.  ° °Self-Pay (no insurance) in [place]: ° °Organization         Address  Phone   Notes  °Sickle Cell Patients, [facility] [address], [place] (336) 832-1970   °[facility] [address], [place] (336) 832-4400   ° [facility] ° [address], [place] (336) 992-4800   °[facility]/Dr. Osei-Bonsu ° [address] [place] [address], [place] or [address], [place] (336) 841-8500 Phone number for both [place] and [place] locations is the same.  °[facility] [address], [place] (336) 299-0000   °[facility] [address] [place] [address], [place] or [address] (336) 852-7530 °(336) 878-2260   °[facility] [address], [place] (336) 350-1642, phone; (336) 294-5005, fax Sees patients 1st and 3rd Saturday of every month.  Must not qualify for public or private insurance (i.e. Medicaid, Medicare, [place] Health Choice, Veterans' Benefits) • Household income should be no more than 200% of the poverty level •The clinic cannot treat you if you are pregnant or think you are pregnant • Sexually transmitted diseases are not treated at the clinic.  ° ° °Dental Care: °Organization         Address  Phone  Notes  °[place] [facility] [address], [place] (336) 641-6152 Accepts children up to age 21 who are enrolled in Medicaid or [place] Health Choice; pregnant women with a Medicaid card; and children who have applied for Medicaid or [place] Health Choice, but were declined, whose parents can pay a reduced fee at time of service.  °[place]  [facility] [place]  [address], [place] (336) 641-7733 Accepts children up to age 21 who are enrolled in Medicaid or [place] Health Choice; pregnant women with a Medicaid card; and children who have applied for Medicaid or [place] Health Choice, but were declined, whose parents can pay a reduced fee at time of service.  °[facility] ° [address],  (336) 641-4533 Patients are seen by appointment only. Walk-ins are not accepted. [facility] will see patients 18 years of age and older. °Monday - Tuesday (8am-5pm) °Most Wednesdays (8:30-5pm) °$30 per visit, cash only  °[facility] ° [address], [place] (336) 641-4533 Patients are seen by appointment only. Walk-ins are not accepted. [facility] will see patients 18 years of age and older. °One   Wednesday Evening (Monthly: Volunteer Based).  $30 per visit, cash only  °[facility]  (919) 537-3737 for adults; Children under age 4, call Graduate Pediatric Dentistry at ([phone]) [phone]. Children aged 4-14, please call ([phone]) [phone] to request a pediatric application. ° Dental services are provided in all areas of dental care including fillings, crowns and bridges, complete and partial dentures, implants, gum treatment, root canals, and extractions. Preventive care is also provided. Treatment is provided to both adults and children. °Patients are selected via a lottery and there is often a waiting list. °  °[facility] [address], °[place] ° (336) 763-8833 [email or web address] °  °[facility] [address], [place], [place] (336)723-1848, Ext. 123 Second and Fourth Thursday of each month, opens at 6:30 AM; Clinic ends at 9 AM.  Patients are seen on a first-come first-served basis, and a limited number are seen during each clinic.  ° °[facility] ° [address], [place], [place] (336) 723-7904    Eligibility Requirements °You must have lived in [place], or [place] counties for at least the last three months. °  You cannot be eligible for state or federal sponsored healthcare insurance, including Veterans Administration, Medicaid, or Medicare. °  You generally cannot be eligible for healthcare insurance through your employer.  °  How to apply: °Eligibility screenings are held every Tuesday and Wednesday afternoon from 1:00 pm until 4:00 pm. You do not need an appointment for the interview!  °[facility] [address], [place], [place] 336-631-2330   °[facility]  336-342-8273   °[facility]  336-703-3100   °[facility]  336-570-6415   ° °Behavioral Health Resources in the Community: °Intensive Outpatient Programs °Organization         Address  Phone  Notes  °[facility] [address], [place], [place] 336-878-6098   °[facility] [address], [place], [place] 336-832-9800   °[facility] [address], [place], [place] ° 336-882-2125   °[facility] [address],  °[place], [place] 1-800-853-5163 or 336-641-4981   °Substance Abuse Resources °Organization         Address  Phone  Notes  °[facility]  336-882-2125   °[facility]  336-784-9470   °[facility]  336-285-9073   °[facility]  336-845-3988   °Residential & Outpatient Substance Abuse Program  1-800-659-3381   °Psychological Services °Organization         Address  Phone  Notes  °[facility]  336- 832-9600   °[facility]  336- 378-7881   °[facility] [address], [place] 1-800-853-5163 or 336-641-4981   ° °Mobile Crisis Teams °Organization         Address  Phone  Notes  °[facility], Mobile Crisis Care Unit  1-877-626-1772   °Assertive °[facility] ° [address]  [place], [place] 336-834-9664   °Sharon DeEsch [address] °[place] 336-554-5454   ° °Self-Help/Support Groups °Organization         Address  Phone             Notes  °[facility] [place] - variety of support groups  336- 373-1402 Call for more information  °Narcotics Anonymous (NA), [facility] [address], °[place]  2 meetings at this location  ° °  Residential Treatment Programs Organization         Address  Phone  Notes  [facility] [address],    [place]  1-613-606-8042   [facility]  [address] [place] [address], [place], [place]   [place], [place] 620-455-4669 Admissions: 8am-3pm M-F  [facility] [place] [address],    [place], [place] 062-376-2831   [facility] [address] [place], [place], [place]   [facility] [address],  [place], [place]   [facility] - Intensive [address]patient [address], [place], [place]   [facility] [place]  [place], [place] 1-657-087-0732 or 956-663-2938   [facility] [address], [place], [place] Accepts Medicaid  [facility] [address]  [place] 1-(408)696-6257 Substance Abuse/Addiction Treatment   [facility]         Address  Phone  Notes  [facility]  6141956933   Domenic Schwab, PhD [address] [place], [place]   337-481-2839 or 909-234-9327   [facility] [place], [place] (437) 730-0903   [facility] [address] [place], [place] 918-624-9392 Insurance/Medicaid/sponsorship through [facility] and Families [address]                                    [place], [place] 504-494-4438 [place] [address] [place], [place] (616) [phone]    Dr. Adele Schilder  (217) [phone]   [facility] Dept. 1) [address], [place] 2) [place] 3)  [address], [place] [phone] (215) [phone]  856-068-7145   [place] 586-642-6639 or [phone] (After Hours)       Take the prescription as directed. Elevate your right foot as much as possible. Call your regular Podiatrist tomorrow morning to schedule a follow up appointment within the next 2 days.  Return to the Emergency Department immediately sooner if worsening.

## 2013-09-03 NOTE — ED Notes (Signed)
Pain rt foot with swelling, Had toenail removed by Md in [place], seen here since then for infection.  Today, foot is swollen .  Lt foot swollen also but less than rt.

## 2013-09-03 NOTE — ED Provider Notes (Signed)
CSN: [id number]     Arrival date & time 09/03/13  1418 History   First MD Initiated Contact with Patient 09/03/13 1645     Chief Complaint  Patient presents with  . Foot Swelling      HPI Pt was seen at 1650.  Per pt, c/o gradual onset and persistence of constant right foot "swelling" for the past 3 weeks. Pt states the swelling began shortly after he had his right great toenail removed 4 weeks ago. Pt was seen in the ED 3 weeks ago for same symptoms, dx cellulitis, rx abx, and instructed to f/u with his Podiatrist. Pt did not f/u with his Podiatrist. Pt states his right foot continues to "be swollen." Pt states 2 weeks ago he "stepped on glass" on his medial right foot. Endorses he "pulled it all out" immediately afterwards. Denies fevers, no rash, no focal motor weakness, no tingling/numbness in extremities, no back pain, no abd pain, no CP/SOB.    Past Medical History  Diagnosis Date  . Hypertension   . High cholesterol   . Congestive heart failure (CHF)   . Arthritis   . MI (myocardial infarction)     7, last one was in 2011  . PUD (peptic ulcer disease)     in 1990s, secondary to medication  . Laceration of right hand 11/27/10  . Chronic back pain     Pain Clinic in [place]  . Chronic bronchitis   . CHF (congestive heart failure)   . Bronchitis, chronic   . Anemia   . GERD (gastroesophageal reflux disease)   . Frequency of urination   . Seizures     entire life, last seizure in 2011;unknown etiology-pt sts heriditary  . Tonsillitis, chronic     Dr. Vicki Mallet in [place]  . Shortness of breath     with exertion  . Depression   . Headache(784.0)   . Laceration of wrist 2007 BIL FOREARMS  . Coronary artery disease    Past Surgical History  Procedure Laterality Date  . Carpal tunnel release      bilateral  . Knee surgery      left-plate to left knee cap from accident  . Coronary artery bypass graft  2002    3 vessels  . Back surgery      3-  . [place]  dilation  12/28/2010    SLF:([facility])J-shaped stomach/nodular mocosa in the distal esophagus/empiric dilation 50mm  . Colonoscopy  12/28/10    SLF: (MAC)Internal hemorrhoids/four small colon polyps  . Esophagogastroduodenoscopy N/A 05/30/2012    SLF: UNCONTROLLED GERD DUE TO LIFESTYLE CHOICE/WEIGHT GAIN/MILD Non-erosive gastritis  . Esophageal biopsy N/A 05/30/2012    Procedure: BIOPSY;  Surgeon: Danie Binder, MD;  Location: [facility];  Service: Endoscopy;  Laterality: N/A;   Family History  Problem Relation Age of Onset  . Diabetes Mother   . Hypertension Mother   . Hypertension Father   . Diabetes Father   . Heart attack      mother, father, brother, sister all deceased due to MI  . Colon cancer Neg Hx   . Liver disease Neg Hx   . Anesthesia problems Neg Hx   . Hypotension Neg Hx   . Malignant hyperthermia Neg Hx   . Pseudochol deficiency Neg Hx   . Colon polyps Neg Hx    History  Substance Use Topics  . Smoking status: Never Smoker   . Smokeless tobacco: Not on file  . Alcohol Use: No  Review of Systems ROS: Statement: All systems negative except as marked or noted in the HPI; Constitutional: Negative for fever and chills. ; ; Eyes: Negative for eye pain, redness and discharge. ; ; ENMT: Negative for ear pain, hoarseness, nasal congestion, sinus pressure and sore throat. ; ; Cardiovascular: Negative for chest pain, palpitations, diaphoresis, dyspnea and peripheral edema. ; ; Respiratory: Negative for cough, wheezing and stridor. ; ; Gastrointestinal: Negative for nausea, vomiting, diarrhea, abdominal pain, blood in stool, hematemesis, jaundice and rectal bleeding. . ; ; Genitourinary: Negative for dysuria, flank pain and hematuria. ; ; Musculoskeletal: +right foot swelling. Negative for back pain and neck pain. Negative for deformity.; ; Skin: Negative for pruritus, rash, abrasions, blisters, bruising and skin lesion.; ; Neuro: Negative for headache, lightheadedness and neck stiffness.  Negative for weakness, altered level of consciousness , altered mental status, extremity weakness, paresthesias, involuntary movement, seizure and syncope.      Allergies  Gabapentin; Ibuprofen; Naproxen; and Zolpidem tartrate  Home Medications   Prior to Admission medications   Medication Sig Start Date End Date Taking? Authorizing Provider  albuterol (PROAIR HFA) 108 (90 BASE) MCG/ACT inhaler Inhale 2 puffs into the lungs every 6 (six) hours as needed for wheezing (for shortness of breath).   Yes Historical Provider, MD  albuterol (PROVENTIL) (2.5 MG/3ML) 0.083% nebulizer solution Take 2.5 mg by nebulization 2 (two) times daily.   Yes Historical Provider, MD  alprazolam Duanne Moron) 2 MG tablet Take 2 mg by mouth Twice daily. 03/15/11  Yes Historical Provider, MD  aspirin 325 MG EC tablet Take 325 mg by mouth daily.     Yes Historical Provider, MD  atorvastatin (LIPITOR) 10 MG tablet Take 10 mg by mouth every morning.    Yes Historical Provider, MD  beclomethasone (QVAR) 80 MCG/ACT inhaler Inhale 2 puffs into the lungs 2 (two) times daily.   Yes Historical Provider, MD  cephALEXin (KEFLEX) 500 MG capsule Take 500 mg by mouth 4 (four) times daily.   Yes Historical Provider, MD  cetirizine (ZYRTEC) 10 MG tablet Take 10 mg by mouth daily as needed for allergies or rhinitis.    Yes Historical Provider, MD  furosemide (LASIX) 40 MG tablet Take 40 mg by mouth daily.     Yes Historical Provider, MD  hydrochlorothiazide (HYDRODIURIL) 25 MG tablet Take 25 mg by mouth daily.   Yes Historical Provider, MD  ipratropium (ATROVENT) 0.02 % nebulizer solution Take 500 mcg by nebulization 2 (two) times daily.   Yes Historical Provider, MD  levETIRAcetam (KEPPRA) 500 MG tablet Take 500 mg by mouth every 12 (twelve) hours.     Yes Historical Provider, MD  lisinopril (PRINIVIL,ZESTRIL) 10 MG tablet Take 5 mg by mouth daily. Take half of a tablet   Yes Historical Provider, MD  metoprolol tartrate (LOPRESSOR) 25 MG  tablet Take 25 mg by mouth 2 (two) times daily.     Yes Historical Provider, MD  NEXIUM 40 MG capsule take 1 capsule 30 MINS PRIOR TO BREAKFAST AND SUPPER.   Yes Orvil Feil, NP  oxyCODONE-acetaminophen (PERCOCET) 10-325 MG per tablet Take 1 tablet by mouth 4 (four) times daily as needed. pain   Yes Historical Provider, MD  potassium chloride (K-DUR) 10 MEQ tablet Take 2 tablets (20 mEq total) by mouth daily. 09/07/12  Yes Johnna Acosta, MD  sertraline (ZOLOFT) 100 MG tablet Take 100 mg by mouth Every morning. 03/15/11  Yes Historical Provider, MD  Tamsulosin HCl (FLOMAX) 0.4 MG CAPS Take 0.4  mg by mouth daily after breakfast.    Yes Historical Provider, MD  clindamycin (CLEOCIN) 150 MG capsule 3 tabs PO TID x 10 days 09/03/13   Alfonzo Feller, DO  potassium chloride (KLOR-CON) 20 MEQ packet Take 20 mEq by mouth 2 (two) times daily. 05/18/11 05/17/12  Janice Norrie, MD   BP 129/74  Pulse 58  Temp(Src) 98 F (36.7 C) (Oral)  Resp 18  Ht 5\' 10"  (1.778 m)  Wt 232 lb (105.235 kg)  BMI 33.29 kg/m2  SpO2 100% Physical Exam 1655: Physical examination:  Nursing notes reviewed; Vital signs and O2 SAT reviewed;  Constitutional: Well developed, Well nourished, Well hydrated, In no acute distress; Head:  Normocephalic, atraumatic; Eyes: EOMI, PERRL, No scleral icterus; ENMT: Mouth and pharynx normal, Mucous membranes moist; Neck: Supple, Full range of motion, No lymphadenopathy; Cardiovascular: Regular rate and rhythm, No gallop; Respiratory: Breath sounds clear & equal bilaterally, No rales, rhonchi, wheezes.  Speaking full sentences with ease, Normal respiratory effort/excursion; Chest: Nontender, Movement normal; Abdomen: Soft, Nontender, Nondistended, Normal bowel sounds; Genitourinary: No CVA tenderness; Extremities: Pulses normal, No tenderness, +right foot edema without erythema or ecchymosis. Healing wound right great toe nailbed without erythema, ecchymosis, warmth fluctuance, drainage. Healing wound  right medial plantar foot without erythema, ecchymosis, warmth, drainage, fluctuance. No palp FB. No calf edema or asymmetry.; Neuro: AA&Ox3, Major CN grossly intact.  Speech clear. No gross focal motor or sensory deficits in extremities.; Skin: Color normal, Warm, Dry.    ED Course  Procedures     MDM  MDM Reviewed: previous chart, nursing note and vitals Reviewed previous: labs Interpretation: x-ray, ultrasound, MRI and labs   Results for orders placed during the hospital encounter of 09/03/13  CBC WITH DIFFERENTIAL      Result Value Ref Range   WBC 6.3  4.0 - 10.5 K/uL   RBC 4.27  4.22 - 5.81 MIL/uL   Hemoglobin 13.1  13.0 - 17.0 g/dL   HCT 37.8 (*) 39.0 - 52.0 %   MCV 88.5  78.0 - 100.0 fL   MCH 30.7  26.0 - 34.0 pg   MCHC 34.7  30.0 - 36.0 g/dL   RDW 14.4  11.5 - 15.5 %   Platelets 182  150 - 400 K/uL   Neutrophils Relative % 57  43 - 77 %   Neutro Abs 3.6  1.7 - 7.7 K/uL   Lymphocytes Relative 34  12 - 46 %   Lymphs Abs 2.1  0.7 - 4.0 K/uL   Monocytes Relative 8  3 - 12 %   Monocytes Absolute 0.5  0.1 - 1.0 K/uL   Eosinophils Relative 1  0 - 5 %   Eosinophils Absolute 0.1  0.0 - 0.7 K/uL   Basophils Relative 0  0 - 1 %   Basophils Absolute 0.0  0.0 - 0.1 K/uL  COMPREHENSIVE METABOLIC PANEL      Result Value Ref Range   Sodium 144  137 - 147 mEq/L   Potassium 3.9  3.7 - 5.3 mEq/L   Chloride 105  96 - 112 mEq/L   CO2 29  19 - 32 mEq/L   Glucose, Bld 86  70 - 99 mg/dL   BUN 8  6 - 23 mg/dL   Creatinine, Ser 1.03  0.50 - 1.35 mg/dL   Calcium 9.2  8.4 - 10.5 mg/dL   Total Protein 7.3  6.0 - 8.3 g/dL   Albumin 3.8  3.5 - 5.2 g/dL  AST 23  0 - 37 U/L   ALT 25  0 - 53 U/L   Alkaline Phosphatase 60  39 - 117 U/L   Total Bilirubin 1.0  0.3 - 1.2 mg/dL   GFR calc non Af Amer 82 (*) >90 mL/min   GFR calc Af Amer >90  >90 mL/min   Anion gap 10  5 - 15   Mr Foot Right Wo Contrast 09/03/2013   CLINICAL DATA:  Pain and swelling, tone and new from first digit, wound at  the first metatarsal  EXAM: MRI OF THE RIGHT FOREFOOT WITHOUT CONTRAST  TECHNIQUE: Multiplanar, multisequence MR imaging was performed. No intravenous contrast was administered.  COMPARISON:  None.  FINDINGS: There is no focal marrow signal abnormality. There is mild soft tissue edema along the dorsal aspect of the midfoot. There is no focal fluid collection. There is no hematoma.  There is no acute fracture or dislocation. There is mild osteoarthritis of the first MTP joint. There is no joint effusion. The visualized flexor and extensor tendons are intact. The visualized peroneus brevis tendon is intact.  IMPRESSION: 1. No MRI evidence of osteomyelitis of the right foot. 2. Mild soft tissue edema along the dorsal aspect of the midfoot which may be reactive versus secondary to cellulitis. No drainable fluid collections.   Electronically Signed   By: Kathreen Devoid   On: 09/03/2013 18:13   US Venous Img Lower Unilateral Right 09/03/2013   CLINICAL DATA:  Lower extremity edema  EXAM: RIGHT LOWER EXTREMITY VENOUS DUPLEX ULTRASOUND  TECHNIQUE: Gray-scale sonography with graded compression, as well as color Doppler and duplex ultrasound were performed to evaluate the lower extremity deep venous systems from the level of the common femoral vein and including the common femoral, femoral, profunda femoral, popliteal and calf veins including the posterior tibial, peroneal and gastrocnemius veins when visible. The superficial great saphenous vein was also interrogated. Spectral Doppler was utilized to evaluate flow at rest and with distal augmentation maneuvers in the common femoral, femoral and popliteal veins.  COMPARISON:  None.  FINDINGS: Flow in the venous structures of the right lower extremity shows spontaneous and phasic flow in all venous segments. There is normal compression and augmentation in the venous structures of the right lower extremity. Venous Doppler signal is normal in all regions. There is no thrombus in  the deep or visualized superficial venous structures on the right. There is no deep venous incompetence on the right. There is lower extremity soft tissue edema.  IMPRESSION: No evidence of right lower extremity deep venous thrombosis. There is soft tissue edema distally.   Electronically Signed   By: Lowella Grip M.D.   On: 09/03/2013 19:03   Dg Foot Complete Right 09/03/2013   CLINICAL DATA:  Right foot pain and swelling.  EXAM: RIGHT FOOT COMPLETE - 3+ VIEW  COMPARISON:  None.  FINDINGS: Lucency noted along the medial sesamoid of the right great toe. This may represent a bipartite sesamoid. Medial right great toe sesamoid fracture cannot be excluded. No acute bony abnormality otherwise noted. No radiopaque foreign body  IMPRESSION: Lucency is noted along the medial sesamoid right great toe. This is most likely a bipartite sesamoid. Sesamoid fracture could present in this fashion. Clinical correlation suggested. No other abnormality identified. No radiopaque foreign body.   Electronically Signed   By: Marcello Moores  Register   On: 09/03/2013 17:19    1945:  Workup reassuring. Will tx with abx for possible cellulitis on MRI. Pt strongly  encourage to f/u with his Podiatrist. Dx and testing d/w pt and family.  Questions answered.  Verb understanding, agreeable to d/c home with outpt f/u.       Alfonzo Feller, DO 09/06/13 [phone]

## 2013-09-03 NOTE — ED Notes (Signed)
Pt left without receiving discharge papers and prescriptions.

## 2013-09-13 ENCOUNTER — Telehealth: Payer: Self-pay | Admitting: Gastroenterology

## 2013-09-13 ENCOUNTER — Encounter (INDEPENDENT_AMBULATORY_CARE_PROVIDER_SITE_OTHER): Payer: PRIVATE HEALTH INSURANCE | Admitting: Gastroenterology

## 2013-09-13 NOTE — Telephone Encounter (Signed)
Pt was a no show

## 2013-09-13 NOTE — Progress Notes (Signed)
   Subjective:    Patient ID: Dalton [name], male    DOB: [birth year], 53 y.o.   MRN: [id number]  HPI  Past Medical History  Diagnosis Date  . Hypertension   . High cholesterol   . Congestive heart failure (CHF)   . Arthritis   . MI (myocardial infarction)     7, last one was in 2011  . PUD (peptic ulcer disease)     in 1990s, secondary to medication  . Laceration of right hand 11/27/10  . Chronic back pain     Pain Clinic in [place]  . Chronic bronchitis   . CHF (congestive heart failure)   . Bronchitis, chronic   . Anemia   . GERD (gastroesophageal reflux disease)   . Frequency of urination   . Seizures     entire life, last seizure in 2011;unknown etiology-pt sts heriditary  . Tonsillitis, chronic     Dr. Vicki Mallet in [place]  . Shortness of breath     with exertion  . Depression   . Headache(784.0)   . Laceration of wrist 2007 BIL FOREARMS  . Coronary artery disease      Review of Systems     Objective:   Physical Exam        Assessment & Plan:

## 2013-09-17 ENCOUNTER — Other Ambulatory Visit: Payer: Self-pay | Admitting: Gastroenterology

## 2013-10-07 ENCOUNTER — Ambulatory Visit (HOSPITAL_BASED_OUTPATIENT_CLINIC_OR_DEPARTMENT_OTHER): Payer: PRIVATE HEALTH INSURANCE

## 2013-11-30 ENCOUNTER — Ambulatory Visit (HOSPITAL_BASED_OUTPATIENT_CLINIC_OR_DEPARTMENT_OTHER): Payer: PRIVATE HEALTH INSURANCE | Attending: Internal Medicine

## 2013-11-30 VITALS — Ht 70.0 in | Wt 250.0 lb

## 2013-11-30 DIAGNOSIS — G4730 Sleep apnea, unspecified: Secondary | ICD-10-CM | POA: Diagnosis present

## 2013-11-30 DIAGNOSIS — G4733 Obstructive sleep apnea (adult) (pediatric): Secondary | ICD-10-CM | POA: Diagnosis not present

## 2013-12-08 DIAGNOSIS — G4733 Obstructive sleep apnea (adult) (pediatric): Secondary | ICD-10-CM

## 2013-12-08 NOTE — Sleep Study (Signed)
   NAME: Dalton [name] DATE OF BIRTH:  [birth year] MEDICAL RECORD NUMBER [id number]  LOCATION: [facility]  PHYSICIAN: YOUNG,CLINTON D  DATE OF STUDY: 11/30/2013  SLEEP STUDY TYPE: Nocturnal Polysomnogram               REFERRING PHYSICIAN: Philis Fendt, MD  INDICATION FOR STUDY: Insomnia with sleep apnea  EPWORTH SLEEPINESS SCORE:   6/24 HEIGHT: 5\' 10"  (177.8 cm)  WEIGHT: 250 lb (113.399 kg)    Body mass index is 35.87 kg/(m^2).  NECK SIZE: 16 in.  MEDICATIONS: Charted for review  SLEEP ARCHITECTURE: Total sleep time 331.5 minutes with sleep efficiency 91.4%. Stage I was 5%, stage II 56.9%, stage III absent, REM 38.2% of total sleep time. Sleep latency 6 minutes, REM latency 48 minutes, awake after sleep onset 25 minutes, arousal index 1.6, bedtime medication: None  RESPIRATORY DATA: Apnea hypopneas index (AHI) 15.9 per hour. 88 total events scored including 19 obstructive apneas, 2 central apneas, 67 hypopneas. Events were more common while supine. REM AHI 26.1 per hour. There were not enough early events to meet protocol requirements for split CPAP titration on this study.  OXYGEN DATA: Loud snoring with oxygen desaturation to a nadir of 85% and mean saturation 94.7% on room air.  CARDIAC DATA: Sinus rhythm with PVCs  MOVEMENT/PARASOMNIA: No significant motor disturbance, bathroom x1  IMPRESSION/ RECOMMENDATION:   1) Moderate obstructive sleep apnea/hypopneas syndrome, AHI 15.9 per hour. Events more common while supine. REM AHI 26.1 per hour. Loud snoring with oxygen desaturation to a nadir of 85% and mean saturation 94.7% on room air. 2) There were not enough early events to permit application of split CPAP titration. This patient can return for a dedicated CPAP titration study if appropriate.   Deneise Lever Diplomate, American Board of Sleep Medicine  ELECTRONICALLY SIGNED ON:  12/08/2013, 9:13 AM [place] PH: [phone]  [phone]   FX: [phone] [place]

## 2013-12-24 NOTE — Telephone Encounter (Signed)
REVIEWED-NO ADDITIONAL RECOMMENDATIONS. 

## 2014-01-22 ENCOUNTER — Other Ambulatory Visit: Payer: Self-pay | Admitting: Gastroenterology

## 2014-01-31 ENCOUNTER — Encounter (HOSPITAL_COMMUNITY): Payer: Self-pay | Admitting: Cardiology

## 2014-02-22 DIAGNOSIS — G894 Chronic pain syndrome: Secondary | ICD-10-CM | POA: Diagnosis not present

## 2014-02-23 DIAGNOSIS — G894 Chronic pain syndrome: Secondary | ICD-10-CM | POA: Diagnosis not present

## 2014-02-24 DIAGNOSIS — G894 Chronic pain syndrome: Secondary | ICD-10-CM | POA: Diagnosis not present

## 2014-02-25 DIAGNOSIS — G894 Chronic pain syndrome: Secondary | ICD-10-CM | POA: Diagnosis not present

## 2014-02-26 DIAGNOSIS — G894 Chronic pain syndrome: Secondary | ICD-10-CM | POA: Diagnosis not present

## 2014-02-27 DIAGNOSIS — G894 Chronic pain syndrome: Secondary | ICD-10-CM | POA: Diagnosis not present

## 2014-02-28 DIAGNOSIS — G894 Chronic pain syndrome: Secondary | ICD-10-CM | POA: Diagnosis not present

## 2014-03-01 DIAGNOSIS — G894 Chronic pain syndrome: Secondary | ICD-10-CM | POA: Diagnosis not present

## 2014-03-02 DIAGNOSIS — G894 Chronic pain syndrome: Secondary | ICD-10-CM | POA: Diagnosis not present

## 2014-03-03 DIAGNOSIS — G894 Chronic pain syndrome: Secondary | ICD-10-CM | POA: Diagnosis not present

## 2014-03-04 DIAGNOSIS — J410 Simple chronic bronchitis: Secondary | ICD-10-CM | POA: Diagnosis not present

## 2014-03-04 DIAGNOSIS — G894 Chronic pain syndrome: Secondary | ICD-10-CM | POA: Diagnosis not present

## 2014-03-05 DIAGNOSIS — G894 Chronic pain syndrome: Secondary | ICD-10-CM | POA: Diagnosis not present

## 2014-03-06 DIAGNOSIS — G894 Chronic pain syndrome: Secondary | ICD-10-CM | POA: Diagnosis not present

## 2014-03-07 DIAGNOSIS — G894 Chronic pain syndrome: Secondary | ICD-10-CM | POA: Diagnosis not present

## 2014-03-08 DIAGNOSIS — G894 Chronic pain syndrome: Secondary | ICD-10-CM | POA: Diagnosis not present

## 2014-03-09 DIAGNOSIS — G894 Chronic pain syndrome: Secondary | ICD-10-CM | POA: Diagnosis not present

## 2014-03-10 DIAGNOSIS — G894 Chronic pain syndrome: Secondary | ICD-10-CM | POA: Diagnosis not present

## 2014-03-11 DIAGNOSIS — G894 Chronic pain syndrome: Secondary | ICD-10-CM | POA: Diagnosis not present

## 2014-03-12 DIAGNOSIS — G894 Chronic pain syndrome: Secondary | ICD-10-CM | POA: Diagnosis not present

## 2014-03-13 DIAGNOSIS — G894 Chronic pain syndrome: Secondary | ICD-10-CM | POA: Diagnosis not present

## 2014-03-14 DIAGNOSIS — G894 Chronic pain syndrome: Secondary | ICD-10-CM | POA: Diagnosis not present

## 2014-03-15 DIAGNOSIS — G894 Chronic pain syndrome: Secondary | ICD-10-CM | POA: Diagnosis not present

## 2014-03-16 DIAGNOSIS — G894 Chronic pain syndrome: Secondary | ICD-10-CM | POA: Diagnosis not present

## 2014-03-17 DIAGNOSIS — G894 Chronic pain syndrome: Secondary | ICD-10-CM | POA: Diagnosis not present

## 2014-03-18 DIAGNOSIS — G894 Chronic pain syndrome: Secondary | ICD-10-CM | POA: Diagnosis not present

## 2014-03-19 DIAGNOSIS — G894 Chronic pain syndrome: Secondary | ICD-10-CM | POA: Diagnosis not present

## 2014-03-20 DIAGNOSIS — G894 Chronic pain syndrome: Secondary | ICD-10-CM | POA: Diagnosis not present

## 2014-03-21 DIAGNOSIS — G894 Chronic pain syndrome: Secondary | ICD-10-CM | POA: Diagnosis not present

## 2014-03-25 DIAGNOSIS — G894 Chronic pain syndrome: Secondary | ICD-10-CM | POA: Diagnosis not present

## 2014-03-26 DIAGNOSIS — G894 Chronic pain syndrome: Secondary | ICD-10-CM | POA: Diagnosis not present

## 2014-03-27 DIAGNOSIS — G894 Chronic pain syndrome: Secondary | ICD-10-CM | POA: Diagnosis not present

## 2014-03-28 DIAGNOSIS — G894 Chronic pain syndrome: Secondary | ICD-10-CM | POA: Diagnosis not present

## 2014-03-29 DIAGNOSIS — G894 Chronic pain syndrome: Secondary | ICD-10-CM | POA: Diagnosis not present

## 2014-03-30 DIAGNOSIS — G894 Chronic pain syndrome: Secondary | ICD-10-CM | POA: Diagnosis not present

## 2014-03-31 DIAGNOSIS — G894 Chronic pain syndrome: Secondary | ICD-10-CM | POA: Diagnosis not present

## 2014-04-01 ENCOUNTER — Ambulatory Visit (HOSPITAL_BASED_OUTPATIENT_CLINIC_OR_DEPARTMENT_OTHER): Payer: Medicaid Other | Attending: Internal Medicine

## 2014-04-01 DIAGNOSIS — G894 Chronic pain syndrome: Secondary | ICD-10-CM | POA: Diagnosis not present

## 2014-04-02 DIAGNOSIS — G894 Chronic pain syndrome: Secondary | ICD-10-CM | POA: Diagnosis not present

## 2014-04-03 DIAGNOSIS — G894 Chronic pain syndrome: Secondary | ICD-10-CM | POA: Diagnosis not present

## 2014-04-04 DIAGNOSIS — G894 Chronic pain syndrome: Secondary | ICD-10-CM | POA: Diagnosis not present

## 2014-04-05 DIAGNOSIS — G894 Chronic pain syndrome: Secondary | ICD-10-CM | POA: Diagnosis not present

## 2014-04-06 DIAGNOSIS — G894 Chronic pain syndrome: Secondary | ICD-10-CM | POA: Diagnosis not present

## 2014-04-07 DIAGNOSIS — G894 Chronic pain syndrome: Secondary | ICD-10-CM | POA: Diagnosis not present

## 2014-04-08 DIAGNOSIS — G894 Chronic pain syndrome: Secondary | ICD-10-CM | POA: Diagnosis not present

## 2014-04-09 DIAGNOSIS — G894 Chronic pain syndrome: Secondary | ICD-10-CM | POA: Diagnosis not present

## 2014-04-10 DIAGNOSIS — G894 Chronic pain syndrome: Secondary | ICD-10-CM | POA: Diagnosis not present

## 2014-04-11 DIAGNOSIS — G894 Chronic pain syndrome: Secondary | ICD-10-CM | POA: Diagnosis not present

## 2014-04-11 DIAGNOSIS — E1140 Type 2 diabetes mellitus with diabetic neuropathy, unspecified: Secondary | ICD-10-CM | POA: Diagnosis not present

## 2014-04-11 DIAGNOSIS — J4520 Mild intermittent asthma, uncomplicated: Secondary | ICD-10-CM | POA: Diagnosis not present

## 2014-04-11 DIAGNOSIS — E784 Other hyperlipidemia: Secondary | ICD-10-CM | POA: Diagnosis not present

## 2014-04-11 DIAGNOSIS — I10 Essential (primary) hypertension: Secondary | ICD-10-CM | POA: Diagnosis not present

## 2014-04-12 DIAGNOSIS — G894 Chronic pain syndrome: Secondary | ICD-10-CM | POA: Diagnosis not present

## 2014-04-13 DIAGNOSIS — G894 Chronic pain syndrome: Secondary | ICD-10-CM | POA: Diagnosis not present

## 2014-04-14 DIAGNOSIS — G894 Chronic pain syndrome: Secondary | ICD-10-CM | POA: Diagnosis not present

## 2014-04-15 DIAGNOSIS — G894 Chronic pain syndrome: Secondary | ICD-10-CM | POA: Diagnosis not present

## 2014-04-16 DIAGNOSIS — G894 Chronic pain syndrome: Secondary | ICD-10-CM | POA: Diagnosis not present

## 2014-04-17 DIAGNOSIS — G894 Chronic pain syndrome: Secondary | ICD-10-CM | POA: Diagnosis not present

## 2014-04-18 DIAGNOSIS — M545 Low back pain: Secondary | ICD-10-CM | POA: Diagnosis not present

## 2014-04-18 DIAGNOSIS — G894 Chronic pain syndrome: Secondary | ICD-10-CM | POA: Diagnosis not present

## 2014-04-18 DIAGNOSIS — M5417 Radiculopathy, lumbosacral region: Secondary | ICD-10-CM | POA: Diagnosis not present

## 2014-04-18 DIAGNOSIS — E0842 Diabetes mellitus due to underlying condition with diabetic polyneuropathy: Secondary | ICD-10-CM | POA: Diagnosis not present

## 2014-04-18 DIAGNOSIS — M62838 Other muscle spasm: Secondary | ICD-10-CM | POA: Diagnosis not present

## 2014-04-19 DIAGNOSIS — J410 Simple chronic bronchitis: Secondary | ICD-10-CM | POA: Diagnosis not present

## 2014-04-19 DIAGNOSIS — G894 Chronic pain syndrome: Secondary | ICD-10-CM | POA: Diagnosis not present

## 2014-04-20 DIAGNOSIS — G894 Chronic pain syndrome: Secondary | ICD-10-CM | POA: Diagnosis not present

## 2014-04-21 DIAGNOSIS — G894 Chronic pain syndrome: Secondary | ICD-10-CM | POA: Diagnosis not present

## 2014-04-22 DIAGNOSIS — G894 Chronic pain syndrome: Secondary | ICD-10-CM | POA: Diagnosis not present

## 2014-04-23 DIAGNOSIS — G894 Chronic pain syndrome: Secondary | ICD-10-CM | POA: Diagnosis not present

## 2014-04-23 DIAGNOSIS — Z5181 Encounter for therapeutic drug level monitoring: Secondary | ICD-10-CM | POA: Diagnosis not present

## 2014-04-23 DIAGNOSIS — F1920 Other psychoactive substance dependence, uncomplicated: Secondary | ICD-10-CM | POA: Diagnosis not present

## 2014-04-24 DIAGNOSIS — G894 Chronic pain syndrome: Secondary | ICD-10-CM | POA: Diagnosis not present

## 2014-04-25 DIAGNOSIS — G894 Chronic pain syndrome: Secondary | ICD-10-CM | POA: Diagnosis not present

## 2014-04-26 DIAGNOSIS — G894 Chronic pain syndrome: Secondary | ICD-10-CM | POA: Diagnosis not present

## 2014-04-27 DIAGNOSIS — G894 Chronic pain syndrome: Secondary | ICD-10-CM | POA: Diagnosis not present

## 2014-04-28 DIAGNOSIS — G894 Chronic pain syndrome: Secondary | ICD-10-CM | POA: Diagnosis not present

## 2014-04-29 DIAGNOSIS — G894 Chronic pain syndrome: Secondary | ICD-10-CM | POA: Diagnosis not present

## 2014-04-30 DIAGNOSIS — G894 Chronic pain syndrome: Secondary | ICD-10-CM | POA: Diagnosis not present

## 2014-05-01 DIAGNOSIS — G894 Chronic pain syndrome: Secondary | ICD-10-CM | POA: Diagnosis not present

## 2014-05-02 DIAGNOSIS — G894 Chronic pain syndrome: Secondary | ICD-10-CM | POA: Diagnosis not present

## 2014-05-03 DIAGNOSIS — G894 Chronic pain syndrome: Secondary | ICD-10-CM | POA: Diagnosis not present

## 2014-05-04 DIAGNOSIS — G894 Chronic pain syndrome: Secondary | ICD-10-CM | POA: Diagnosis not present

## 2014-05-05 DIAGNOSIS — G894 Chronic pain syndrome: Secondary | ICD-10-CM | POA: Diagnosis not present

## 2014-05-06 DIAGNOSIS — G894 Chronic pain syndrome: Secondary | ICD-10-CM | POA: Diagnosis not present

## 2014-05-07 DIAGNOSIS — G894 Chronic pain syndrome: Secondary | ICD-10-CM | POA: Diagnosis not present

## 2014-05-08 DIAGNOSIS — E291 Testicular hypofunction: Secondary | ICD-10-CM | POA: Diagnosis not present

## 2014-05-08 DIAGNOSIS — I2510 Atherosclerotic heart disease of native coronary artery without angina pectoris: Secondary | ICD-10-CM | POA: Diagnosis not present

## 2014-05-08 DIAGNOSIS — Z7902 Long term (current) use of antithrombotics/antiplatelets: Secondary | ICD-10-CM | POA: Diagnosis not present

## 2014-05-08 DIAGNOSIS — M1990 Unspecified osteoarthritis, unspecified site: Secondary | ICD-10-CM | POA: Diagnosis not present

## 2014-05-08 DIAGNOSIS — M5416 Radiculopathy, lumbar region: Secondary | ICD-10-CM | POA: Diagnosis not present

## 2014-05-08 DIAGNOSIS — G894 Chronic pain syndrome: Secondary | ICD-10-CM | POA: Diagnosis not present

## 2014-05-08 DIAGNOSIS — E1142 Type 2 diabetes mellitus with diabetic polyneuropathy: Secondary | ICD-10-CM | POA: Diagnosis not present

## 2014-05-08 DIAGNOSIS — J45909 Unspecified asthma, uncomplicated: Secondary | ICD-10-CM | POA: Diagnosis not present

## 2014-05-08 DIAGNOSIS — Z951 Presence of aortocoronary bypass graft: Secondary | ICD-10-CM | POA: Diagnosis not present

## 2014-05-08 DIAGNOSIS — M5417 Radiculopathy, lumbosacral region: Secondary | ICD-10-CM | POA: Diagnosis not present

## 2014-05-08 DIAGNOSIS — Z79899 Other long term (current) drug therapy: Secondary | ICD-10-CM | POA: Diagnosis not present

## 2014-05-08 DIAGNOSIS — I10 Essential (primary) hypertension: Secondary | ICD-10-CM | POA: Diagnosis not present

## 2014-05-08 DIAGNOSIS — Z7951 Long term (current) use of inhaled steroids: Secondary | ICD-10-CM | POA: Diagnosis not present

## 2014-05-08 DIAGNOSIS — Z888 Allergy status to other drugs, medicaments and biological substances status: Secondary | ICD-10-CM | POA: Diagnosis not present

## 2014-05-09 DIAGNOSIS — G894 Chronic pain syndrome: Secondary | ICD-10-CM | POA: Diagnosis not present

## 2014-05-10 DIAGNOSIS — G894 Chronic pain syndrome: Secondary | ICD-10-CM | POA: Diagnosis not present

## 2014-05-11 DIAGNOSIS — G894 Chronic pain syndrome: Secondary | ICD-10-CM | POA: Diagnosis not present

## 2014-05-12 DIAGNOSIS — G894 Chronic pain syndrome: Secondary | ICD-10-CM | POA: Diagnosis not present

## 2014-05-13 DIAGNOSIS — G894 Chronic pain syndrome: Secondary | ICD-10-CM | POA: Diagnosis not present

## 2014-05-14 DIAGNOSIS — J410 Simple chronic bronchitis: Secondary | ICD-10-CM | POA: Diagnosis not present

## 2014-05-14 DIAGNOSIS — G894 Chronic pain syndrome: Secondary | ICD-10-CM | POA: Diagnosis not present

## 2014-05-15 DIAGNOSIS — G894 Chronic pain syndrome: Secondary | ICD-10-CM | POA: Diagnosis not present

## 2014-05-16 DIAGNOSIS — G894 Chronic pain syndrome: Secondary | ICD-10-CM | POA: Diagnosis not present

## 2014-05-17 DIAGNOSIS — G894 Chronic pain syndrome: Secondary | ICD-10-CM | POA: Diagnosis not present

## 2014-05-18 DIAGNOSIS — G894 Chronic pain syndrome: Secondary | ICD-10-CM | POA: Diagnosis not present

## 2014-05-19 DIAGNOSIS — G894 Chronic pain syndrome: Secondary | ICD-10-CM | POA: Diagnosis not present

## 2014-05-20 DIAGNOSIS — G894 Chronic pain syndrome: Secondary | ICD-10-CM | POA: Diagnosis not present

## 2014-05-21 DIAGNOSIS — G894 Chronic pain syndrome: Secondary | ICD-10-CM | POA: Diagnosis not present

## 2014-05-22 DIAGNOSIS — G894 Chronic pain syndrome: Secondary | ICD-10-CM | POA: Diagnosis not present

## 2014-05-24 DIAGNOSIS — G894 Chronic pain syndrome: Secondary | ICD-10-CM | POA: Diagnosis not present

## 2014-05-25 DIAGNOSIS — G894 Chronic pain syndrome: Secondary | ICD-10-CM | POA: Diagnosis not present

## 2014-05-26 DIAGNOSIS — G894 Chronic pain syndrome: Secondary | ICD-10-CM | POA: Diagnosis not present

## 2014-05-27 DIAGNOSIS — G894 Chronic pain syndrome: Secondary | ICD-10-CM | POA: Diagnosis not present

## 2014-05-28 DIAGNOSIS — G894 Chronic pain syndrome: Secondary | ICD-10-CM | POA: Diagnosis not present

## 2014-05-29 DIAGNOSIS — G894 Chronic pain syndrome: Secondary | ICD-10-CM | POA: Diagnosis not present

## 2014-05-30 DIAGNOSIS — G894 Chronic pain syndrome: Secondary | ICD-10-CM | POA: Diagnosis not present

## 2014-05-31 DIAGNOSIS — G894 Chronic pain syndrome: Secondary | ICD-10-CM | POA: Diagnosis not present

## 2014-06-01 DIAGNOSIS — G894 Chronic pain syndrome: Secondary | ICD-10-CM | POA: Diagnosis not present

## 2014-06-02 DIAGNOSIS — G894 Chronic pain syndrome: Secondary | ICD-10-CM | POA: Diagnosis not present

## 2014-06-03 DIAGNOSIS — G894 Chronic pain syndrome: Secondary | ICD-10-CM | POA: Diagnosis not present

## 2014-06-04 ENCOUNTER — Other Ambulatory Visit: Payer: Self-pay | Admitting: Gastroenterology

## 2014-06-04 DIAGNOSIS — G894 Chronic pain syndrome: Secondary | ICD-10-CM | POA: Diagnosis not present

## 2014-06-04 NOTE — Telephone Encounter (Signed)
Needs ov for further refills. Refill X 1.

## 2014-06-05 DIAGNOSIS — G894 Chronic pain syndrome: Secondary | ICD-10-CM | POA: Diagnosis not present

## 2014-06-06 DIAGNOSIS — G894 Chronic pain syndrome: Secondary | ICD-10-CM | POA: Diagnosis not present

## 2014-06-07 DIAGNOSIS — G894 Chronic pain syndrome: Secondary | ICD-10-CM | POA: Diagnosis not present

## 2014-06-07 DIAGNOSIS — J410 Simple chronic bronchitis: Secondary | ICD-10-CM | POA: Diagnosis not present

## 2014-06-08 DIAGNOSIS — G894 Chronic pain syndrome: Secondary | ICD-10-CM | POA: Diagnosis not present

## 2014-06-09 DIAGNOSIS — G894 Chronic pain syndrome: Secondary | ICD-10-CM | POA: Diagnosis not present

## 2014-06-10 DIAGNOSIS — H2511 Age-related nuclear cataract, right eye: Secondary | ICD-10-CM | POA: Diagnosis not present

## 2014-06-10 DIAGNOSIS — H2512 Age-related nuclear cataract, left eye: Secondary | ICD-10-CM | POA: Diagnosis not present

## 2014-06-10 DIAGNOSIS — G894 Chronic pain syndrome: Secondary | ICD-10-CM | POA: Diagnosis not present

## 2014-06-10 DIAGNOSIS — E119 Type 2 diabetes mellitus without complications: Secondary | ICD-10-CM | POA: Diagnosis not present

## 2014-06-11 DIAGNOSIS — G894 Chronic pain syndrome: Secondary | ICD-10-CM | POA: Diagnosis not present

## 2014-06-11 DIAGNOSIS — M545 Low back pain: Secondary | ICD-10-CM | POA: Diagnosis not present

## 2014-06-12 DIAGNOSIS — G894 Chronic pain syndrome: Secondary | ICD-10-CM | POA: Diagnosis not present

## 2014-06-13 DIAGNOSIS — G894 Chronic pain syndrome: Secondary | ICD-10-CM | POA: Diagnosis not present

## 2014-06-14 DIAGNOSIS — G894 Chronic pain syndrome: Secondary | ICD-10-CM | POA: Diagnosis not present

## 2014-06-15 DIAGNOSIS — G894 Chronic pain syndrome: Secondary | ICD-10-CM | POA: Diagnosis not present

## 2014-06-16 DIAGNOSIS — G894 Chronic pain syndrome: Secondary | ICD-10-CM | POA: Diagnosis not present

## 2014-06-17 ENCOUNTER — Other Ambulatory Visit: Payer: Self-pay | Admitting: Adult Health Nurse Practitioner

## 2014-06-17 DIAGNOSIS — G894 Chronic pain syndrome: Secondary | ICD-10-CM | POA: Diagnosis not present

## 2014-06-17 DIAGNOSIS — M545 Low back pain: Secondary | ICD-10-CM

## 2014-06-18 DIAGNOSIS — G894 Chronic pain syndrome: Secondary | ICD-10-CM | POA: Diagnosis not present

## 2014-06-19 DIAGNOSIS — G894 Chronic pain syndrome: Secondary | ICD-10-CM | POA: Diagnosis not present

## 2014-06-20 ENCOUNTER — Ambulatory Visit: Payer: Medicaid Other | Admitting: Nurse Practitioner

## 2014-06-20 DIAGNOSIS — G894 Chronic pain syndrome: Secondary | ICD-10-CM | POA: Diagnosis not present

## 2014-06-21 DIAGNOSIS — G894 Chronic pain syndrome: Secondary | ICD-10-CM | POA: Diagnosis not present

## 2014-06-22 DIAGNOSIS — G894 Chronic pain syndrome: Secondary | ICD-10-CM | POA: Diagnosis not present

## 2014-06-23 DIAGNOSIS — G894 Chronic pain syndrome: Secondary | ICD-10-CM | POA: Diagnosis not present

## 2014-06-24 DIAGNOSIS — G894 Chronic pain syndrome: Secondary | ICD-10-CM | POA: Diagnosis not present

## 2014-06-25 DIAGNOSIS — G894 Chronic pain syndrome: Secondary | ICD-10-CM | POA: Diagnosis not present

## 2014-06-26 DIAGNOSIS — G894 Chronic pain syndrome: Secondary | ICD-10-CM | POA: Diagnosis not present

## 2014-06-27 DIAGNOSIS — G894 Chronic pain syndrome: Secondary | ICD-10-CM | POA: Diagnosis not present

## 2014-06-28 DIAGNOSIS — G894 Chronic pain syndrome: Secondary | ICD-10-CM | POA: Diagnosis not present

## 2014-06-29 DIAGNOSIS — G894 Chronic pain syndrome: Secondary | ICD-10-CM | POA: Diagnosis not present

## 2014-06-30 DIAGNOSIS — G894 Chronic pain syndrome: Secondary | ICD-10-CM | POA: Diagnosis not present

## 2014-07-01 DIAGNOSIS — G894 Chronic pain syndrome: Secondary | ICD-10-CM | POA: Diagnosis not present

## 2014-07-02 DIAGNOSIS — G894 Chronic pain syndrome: Secondary | ICD-10-CM | POA: Diagnosis not present

## 2014-07-03 DIAGNOSIS — G894 Chronic pain syndrome: Secondary | ICD-10-CM | POA: Diagnosis not present

## 2014-07-04 DIAGNOSIS — G894 Chronic pain syndrome: Secondary | ICD-10-CM | POA: Diagnosis not present

## 2014-07-05 DIAGNOSIS — G894 Chronic pain syndrome: Secondary | ICD-10-CM | POA: Diagnosis not present

## 2014-07-06 DIAGNOSIS — G894 Chronic pain syndrome: Secondary | ICD-10-CM | POA: Diagnosis not present

## 2014-07-07 DIAGNOSIS — G894 Chronic pain syndrome: Secondary | ICD-10-CM | POA: Diagnosis not present

## 2014-07-08 DIAGNOSIS — G894 Chronic pain syndrome: Secondary | ICD-10-CM | POA: Diagnosis not present

## 2014-07-08 DIAGNOSIS — J410 Simple chronic bronchitis: Secondary | ICD-10-CM | POA: Diagnosis not present

## 2014-07-09 DIAGNOSIS — G894 Chronic pain syndrome: Secondary | ICD-10-CM | POA: Diagnosis not present

## 2014-07-10 DIAGNOSIS — M25562 Pain in left knee: Secondary | ICD-10-CM | POA: Diagnosis not present

## 2014-07-10 DIAGNOSIS — M150 Primary generalized (osteo)arthritis: Secondary | ICD-10-CM | POA: Diagnosis not present

## 2014-07-10 DIAGNOSIS — G8929 Other chronic pain: Secondary | ICD-10-CM | POA: Diagnosis not present

## 2014-07-10 DIAGNOSIS — Z79899 Other long term (current) drug therapy: Secondary | ICD-10-CM | POA: Diagnosis not present

## 2014-07-10 DIAGNOSIS — G894 Chronic pain syndrome: Secondary | ICD-10-CM | POA: Diagnosis not present

## 2014-07-10 DIAGNOSIS — Z5181 Encounter for therapeutic drug level monitoring: Secondary | ICD-10-CM | POA: Diagnosis not present

## 2014-07-11 DIAGNOSIS — G894 Chronic pain syndrome: Secondary | ICD-10-CM | POA: Diagnosis not present

## 2014-07-12 DIAGNOSIS — G894 Chronic pain syndrome: Secondary | ICD-10-CM | POA: Diagnosis not present

## 2014-07-13 DIAGNOSIS — G894 Chronic pain syndrome: Secondary | ICD-10-CM | POA: Diagnosis not present

## 2014-07-14 DIAGNOSIS — G894 Chronic pain syndrome: Secondary | ICD-10-CM | POA: Diagnosis not present

## 2014-07-15 DIAGNOSIS — G894 Chronic pain syndrome: Secondary | ICD-10-CM | POA: Diagnosis not present

## 2014-07-16 DIAGNOSIS — G894 Chronic pain syndrome: Secondary | ICD-10-CM | POA: Diagnosis not present

## 2014-07-17 DIAGNOSIS — G894 Chronic pain syndrome: Secondary | ICD-10-CM | POA: Diagnosis not present

## 2014-07-18 DIAGNOSIS — G894 Chronic pain syndrome: Secondary | ICD-10-CM | POA: Diagnosis not present

## 2014-07-19 ENCOUNTER — Inpatient Hospital Stay: Admission: RE | Admit: 2014-07-19 | Payer: Medicaid Other | Source: Ambulatory Visit

## 2014-07-19 DIAGNOSIS — G894 Chronic pain syndrome: Secondary | ICD-10-CM | POA: Diagnosis not present

## 2014-07-20 DIAGNOSIS — G894 Chronic pain syndrome: Secondary | ICD-10-CM | POA: Diagnosis not present

## 2014-07-21 DIAGNOSIS — G894 Chronic pain syndrome: Secondary | ICD-10-CM | POA: Diagnosis not present

## 2014-07-22 DIAGNOSIS — G894 Chronic pain syndrome: Secondary | ICD-10-CM | POA: Diagnosis not present

## 2014-07-23 DIAGNOSIS — G894 Chronic pain syndrome: Secondary | ICD-10-CM | POA: Diagnosis not present

## 2014-07-24 DIAGNOSIS — G894 Chronic pain syndrome: Secondary | ICD-10-CM | POA: Diagnosis not present

## 2014-07-25 DIAGNOSIS — G894 Chronic pain syndrome: Secondary | ICD-10-CM | POA: Diagnosis not present

## 2014-07-26 DIAGNOSIS — M47817 Spondylosis without myelopathy or radiculopathy, lumbosacral region: Secondary | ICD-10-CM | POA: Diagnosis not present

## 2014-07-26 DIAGNOSIS — M47816 Spondylosis without myelopathy or radiculopathy, lumbar region: Secondary | ICD-10-CM | POA: Diagnosis not present

## 2014-07-26 DIAGNOSIS — G894 Chronic pain syndrome: Secondary | ICD-10-CM | POA: Diagnosis not present

## 2014-07-26 DIAGNOSIS — M4806 Spinal stenosis, lumbar region: Secondary | ICD-10-CM | POA: Diagnosis not present

## 2014-07-27 DIAGNOSIS — G894 Chronic pain syndrome: Secondary | ICD-10-CM | POA: Diagnosis not present

## 2014-07-28 DIAGNOSIS — G894 Chronic pain syndrome: Secondary | ICD-10-CM | POA: Diagnosis not present

## 2014-07-29 DIAGNOSIS — G894 Chronic pain syndrome: Secondary | ICD-10-CM | POA: Diagnosis not present

## 2014-07-30 DIAGNOSIS — G894 Chronic pain syndrome: Secondary | ICD-10-CM | POA: Diagnosis not present

## 2014-07-31 DIAGNOSIS — G894 Chronic pain syndrome: Secondary | ICD-10-CM | POA: Diagnosis not present

## 2014-08-01 DIAGNOSIS — G894 Chronic pain syndrome: Secondary | ICD-10-CM | POA: Diagnosis not present

## 2014-08-02 DIAGNOSIS — G894 Chronic pain syndrome: Secondary | ICD-10-CM | POA: Diagnosis not present

## 2014-08-03 DIAGNOSIS — G894 Chronic pain syndrome: Secondary | ICD-10-CM | POA: Diagnosis not present

## 2014-08-04 DIAGNOSIS — G894 Chronic pain syndrome: Secondary | ICD-10-CM | POA: Diagnosis not present

## 2014-08-05 DIAGNOSIS — G894 Chronic pain syndrome: Secondary | ICD-10-CM | POA: Diagnosis not present

## 2014-08-06 DIAGNOSIS — J410 Simple chronic bronchitis: Secondary | ICD-10-CM | POA: Diagnosis not present

## 2014-08-06 DIAGNOSIS — G894 Chronic pain syndrome: Secondary | ICD-10-CM | POA: Diagnosis not present

## 2014-08-07 DIAGNOSIS — G894 Chronic pain syndrome: Secondary | ICD-10-CM | POA: Diagnosis not present

## 2014-08-08 DIAGNOSIS — G894 Chronic pain syndrome: Secondary | ICD-10-CM | POA: Diagnosis not present

## 2014-08-09 DIAGNOSIS — G894 Chronic pain syndrome: Secondary | ICD-10-CM | POA: Diagnosis not present

## 2014-08-10 DIAGNOSIS — G894 Chronic pain syndrome: Secondary | ICD-10-CM | POA: Diagnosis not present

## 2014-08-11 DIAGNOSIS — G894 Chronic pain syndrome: Secondary | ICD-10-CM | POA: Diagnosis not present

## 2014-08-19 DIAGNOSIS — G894 Chronic pain syndrome: Secondary | ICD-10-CM | POA: Diagnosis not present

## 2014-08-20 DIAGNOSIS — G894 Chronic pain syndrome: Secondary | ICD-10-CM | POA: Diagnosis not present

## 2014-08-21 DIAGNOSIS — G894 Chronic pain syndrome: Secondary | ICD-10-CM | POA: Diagnosis not present

## 2014-08-22 DIAGNOSIS — G894 Chronic pain syndrome: Secondary | ICD-10-CM | POA: Diagnosis not present

## 2014-08-23 DIAGNOSIS — G894 Chronic pain syndrome: Secondary | ICD-10-CM | POA: Diagnosis not present

## 2014-08-24 DIAGNOSIS — G894 Chronic pain syndrome: Secondary | ICD-10-CM | POA: Diagnosis not present

## 2014-08-25 DIAGNOSIS — G894 Chronic pain syndrome: Secondary | ICD-10-CM | POA: Diagnosis not present

## 2014-08-26 DIAGNOSIS — G894 Chronic pain syndrome: Secondary | ICD-10-CM | POA: Diagnosis not present

## 2014-08-27 DIAGNOSIS — G894 Chronic pain syndrome: Secondary | ICD-10-CM | POA: Diagnosis not present

## 2014-08-28 DIAGNOSIS — G894 Chronic pain syndrome: Secondary | ICD-10-CM | POA: Diagnosis not present

## 2014-08-29 DIAGNOSIS — G894 Chronic pain syndrome: Secondary | ICD-10-CM | POA: Diagnosis not present

## 2014-08-30 DIAGNOSIS — G894 Chronic pain syndrome: Secondary | ICD-10-CM | POA: Diagnosis not present

## 2014-08-31 DIAGNOSIS — G894 Chronic pain syndrome: Secondary | ICD-10-CM | POA: Diagnosis not present

## 2014-09-01 DIAGNOSIS — G894 Chronic pain syndrome: Secondary | ICD-10-CM | POA: Diagnosis not present

## 2014-09-02 DIAGNOSIS — G894 Chronic pain syndrome: Secondary | ICD-10-CM | POA: Diagnosis not present

## 2014-09-02 DIAGNOSIS — J410 Simple chronic bronchitis: Secondary | ICD-10-CM | POA: Diagnosis not present

## 2014-09-03 DIAGNOSIS — G894 Chronic pain syndrome: Secondary | ICD-10-CM | POA: Diagnosis not present

## 2014-09-04 DIAGNOSIS — G894 Chronic pain syndrome: Secondary | ICD-10-CM | POA: Diagnosis not present

## 2014-09-05 DIAGNOSIS — G894 Chronic pain syndrome: Secondary | ICD-10-CM | POA: Diagnosis not present

## 2014-09-06 DIAGNOSIS — G894 Chronic pain syndrome: Secondary | ICD-10-CM | POA: Diagnosis not present

## 2014-09-07 DIAGNOSIS — G894 Chronic pain syndrome: Secondary | ICD-10-CM | POA: Diagnosis not present

## 2014-09-08 DIAGNOSIS — G894 Chronic pain syndrome: Secondary | ICD-10-CM | POA: Diagnosis not present

## 2014-09-09 DIAGNOSIS — M545 Low back pain: Secondary | ICD-10-CM | POA: Diagnosis not present

## 2014-09-09 DIAGNOSIS — M150 Primary generalized (osteo)arthritis: Secondary | ICD-10-CM | POA: Diagnosis not present

## 2014-09-09 DIAGNOSIS — M4696 Unspecified inflammatory spondylopathy, lumbar region: Secondary | ICD-10-CM | POA: Diagnosis not present

## 2014-09-09 DIAGNOSIS — G894 Chronic pain syndrome: Secondary | ICD-10-CM | POA: Diagnosis not present

## 2014-09-09 DIAGNOSIS — Z5181 Encounter for therapeutic drug level monitoring: Secondary | ICD-10-CM | POA: Diagnosis not present

## 2014-09-09 DIAGNOSIS — Z79899 Other long term (current) drug therapy: Secondary | ICD-10-CM | POA: Diagnosis not present

## 2014-09-10 DIAGNOSIS — G894 Chronic pain syndrome: Secondary | ICD-10-CM | POA: Diagnosis not present

## 2014-09-11 DIAGNOSIS — G894 Chronic pain syndrome: Secondary | ICD-10-CM | POA: Diagnosis not present

## 2014-09-12 DIAGNOSIS — G894 Chronic pain syndrome: Secondary | ICD-10-CM | POA: Diagnosis not present

## 2014-09-13 DIAGNOSIS — G894 Chronic pain syndrome: Secondary | ICD-10-CM | POA: Diagnosis not present

## 2014-09-14 DIAGNOSIS — G894 Chronic pain syndrome: Secondary | ICD-10-CM | POA: Diagnosis not present

## 2014-09-15 DIAGNOSIS — G894 Chronic pain syndrome: Secondary | ICD-10-CM | POA: Diagnosis not present

## 2014-09-16 DIAGNOSIS — G894 Chronic pain syndrome: Secondary | ICD-10-CM | POA: Diagnosis not present

## 2014-09-17 DIAGNOSIS — G894 Chronic pain syndrome: Secondary | ICD-10-CM | POA: Diagnosis not present

## 2014-09-18 DIAGNOSIS — G894 Chronic pain syndrome: Secondary | ICD-10-CM | POA: Diagnosis not present

## 2014-09-19 DIAGNOSIS — G894 Chronic pain syndrome: Secondary | ICD-10-CM | POA: Diagnosis not present

## 2014-09-20 DIAGNOSIS — G894 Chronic pain syndrome: Secondary | ICD-10-CM | POA: Diagnosis not present

## 2014-09-21 DIAGNOSIS — G894 Chronic pain syndrome: Secondary | ICD-10-CM | POA: Diagnosis not present

## 2014-09-22 DIAGNOSIS — G894 Chronic pain syndrome: Secondary | ICD-10-CM | POA: Diagnosis not present

## 2014-09-23 DIAGNOSIS — G894 Chronic pain syndrome: Secondary | ICD-10-CM | POA: Diagnosis not present

## 2014-09-24 DIAGNOSIS — G894 Chronic pain syndrome: Secondary | ICD-10-CM | POA: Diagnosis not present

## 2014-09-25 DIAGNOSIS — G894 Chronic pain syndrome: Secondary | ICD-10-CM | POA: Diagnosis not present

## 2014-09-26 DIAGNOSIS — G894 Chronic pain syndrome: Secondary | ICD-10-CM | POA: Diagnosis not present

## 2014-09-27 DIAGNOSIS — G894 Chronic pain syndrome: Secondary | ICD-10-CM | POA: Diagnosis not present

## 2014-09-27 DIAGNOSIS — J410 Simple chronic bronchitis: Secondary | ICD-10-CM | POA: Diagnosis not present

## 2014-09-28 DIAGNOSIS — G894 Chronic pain syndrome: Secondary | ICD-10-CM | POA: Diagnosis not present

## 2014-09-29 DIAGNOSIS — G894 Chronic pain syndrome: Secondary | ICD-10-CM | POA: Diagnosis not present

## 2014-09-30 DIAGNOSIS — G894 Chronic pain syndrome: Secondary | ICD-10-CM | POA: Diagnosis not present

## 2014-10-01 ENCOUNTER — Encounter: Payer: Self-pay | Admitting: Nurse Practitioner

## 2014-10-01 ENCOUNTER — Ambulatory Visit (INDEPENDENT_AMBULATORY_CARE_PROVIDER_SITE_OTHER): Payer: Medicare Other | Admitting: Nurse Practitioner

## 2014-10-01 VITALS — BP 125/82 | HR 57 | Temp 98.5°F | Ht 70.0 in | Wt 246.6 lb

## 2014-10-01 DIAGNOSIS — K5900 Constipation, unspecified: Secondary | ICD-10-CM | POA: Diagnosis not present

## 2014-10-01 DIAGNOSIS — K219 Gastro-esophageal reflux disease without esophagitis: Secondary | ICD-10-CM

## 2014-10-01 DIAGNOSIS — G894 Chronic pain syndrome: Secondary | ICD-10-CM | POA: Diagnosis not present

## 2014-10-01 MED ORDER — ESOMEPRAZOLE MAGNESIUM 40 MG PO CPDR: 40.0000 mg | DELAYED_RELEASE_CAPSULE | Freq: Two times a day (BID) | ORAL | Status: DC

## 2014-10-01 NOTE — Patient Instructions (Signed)
1. I send in a refill of Nexium and to your pharmacy. 2. I am giving you samples of MiraLAX. This will help with her constipation. He can take it once a day. 3. Return for follow-up in 3 months.

## 2014-10-01 NOTE — Progress Notes (Signed)
Referring Provider: Nolene Ebbs, MD Primary Care Physician:  Philis Fendt, MD Primary GI:  Dr. Oneida Alar  Chief Complaint  Patient presents with  . Dysphagia    Been out of Nexium    HPI:   54 year old male presents for office visit for medication refills for Nexium. Patient has a history of peptic ulcer disease with last EGD on 05/30/2012 which found mild nonerosive gastritis and biopsy showing positive H. pylori. He was successfully treated post procedure.  Today he states he's completely out of Nexium, ran out about 3 months ago,. He subsequently has had esogoageal burning and tightness. Denies N/V, hematochezia, and melena. Also has occasional epigastric to mid abdominal pain. Has a bowel movement about every 2 days which are usually formed/soft, occasionally hard stools. Was using Miralax which worked for a while but he ran out. Denies dyspnea, dizziness, lightheadedness, syncope, near syncope. Denies any other upper or lower GI symptoms.   Past Medical History  Diagnosis Date  . Hypertension   . High cholesterol   . Congestive heart failure (CHF)   . Arthritis   . MI (myocardial infarction)     7, last one was in 2011  . PUD (peptic ulcer disease)     in 1990s, secondary to medication  . Laceration of right hand 11/27/10  . Chronic back pain     Pain Clinic in [place]  . Chronic bronchitis   . CHF (congestive heart failure)   . Bronchitis, chronic   . Anemia   . GERD (gastroesophageal reflux disease)   . Frequency of urination   . Seizures     entire life, last seizure in 2011;unknown etiology-pt sts heriditary  . Tonsillitis, chronic     Dr. Vicki Mallet in [place]  . Shortness of breath     with exertion  . Depression   . Headache(784.0)   . Laceration of wrist 2007 BIL FOREARMS  . Coronary artery disease     Past Surgical History  Procedure Laterality Date  . Carpal tunnel release      bilateral  . Knee surgery      left-plate to left knee cap  from accident  . Coronary artery bypass graft  2002    3 vessels  . Back surgery      3-  . Savory dilation  12/28/2010    SLF:(MAC)J-shaped stomach/nodular mocosa in the distal esophagus/empiric dilation 80mm  . Colonoscopy  12/28/10    SLF: (MAC)Internal hemorrhoids/four small colon polyps  . Esophagogastroduodenoscopy N/A 05/30/2012    SLF: UNCONTROLLED GERD DUE TO LIFESTYLE CHOICE/WEIGHT GAIN/MILD Non-erosive gastritis  . Esophageal biopsy N/A 05/30/2012    Procedure: BIOPSY;  Surgeon: Danie Binder, MD;  Location: [facility];  Service: Endoscopy;  Laterality: N/A;  . Left heart catheterization with coronary angiogram N/A 08/31/2011    Procedure: LEFT HEART CATHETERIZATION WITH CORONARY ANGIOGRAM;  Surgeon: Laverda Page, MD;  Location: [facility] CATH LAB;  Service: Cardiovascular;  Laterality: N/A;    Current Outpatient Prescriptions  Medication Sig Dispense Refill  . albuterol (PROAIR HFA) 108 (90 BASE) MCG/ACT inhaler Inhale 2 puffs into the lungs every 6 (six) hours as needed for wheezing (for shortness of breath).    [name] Kitchen albuterol (PROVENTIL) (2.5 MG/3ML) 0.083% nebulizer solution Take 2.5 mg by nebulization 2 (two) times daily.    [name] Kitchen alprazolam (XANAX) 2 MG tablet Take 2 mg by mouth Twice daily.    [name] Kitchen aspirin 325 MG EC tablet Take 325 mg by mouth daily.      [name] Kitchen  atorvastatin (LIPITOR) 10 MG tablet Take 10 mg by mouth every morning.     . beclomethasone (QVAR) 80 MCG/ACT inhaler Inhale 2 puffs into the lungs 2 (two) times daily.    . cetirizine (ZYRTEC) 10 MG tablet Take 10 mg by mouth daily as needed for allergies or rhinitis.     [name] Kitchen diclofenac sodium (VOLTAREN) 1 % GEL Apply topically 4 (four) times daily. As directed    . furosemide (LASIX) 40 MG tablet Take 40 mg by mouth daily.      . hydrochlorothiazide (HYDRODIURIL) 25 MG tablet Take 25 mg by mouth daily.    [name] Kitchen ipratropium (ATROVENT) 0.02 % nebulizer solution Take 500 mcg by nebulization 2 (two) times daily.    [name] Kitchen lamoTRIgine (LAMICTAL) 200 MG  tablet Take 200 mg by mouth daily.    [name] Kitchen levETIRAcetam (KEPPRA) 500 MG tablet Take 500 mg by mouth every 12 (twelve) hours.      [name] Kitchen lisinopril (PRINIVIL,ZESTRIL) 10 MG tablet Take 5 mg by mouth daily. Take half of a tablet    . metFORMIN (GLUCOPHAGE) 1000 MG tablet Take 1,000 mg by mouth daily with breakfast.    . metoprolol tartrate (LOPRESSOR) 25 MG tablet Take 25 mg by mouth 2 (two) times daily.      [name] Kitchen oxyCODONE-acetaminophen (PERCOCET) 10-325 MG per tablet Take 1 tablet by mouth 4 (four) times daily as needed. pain    . pregabalin (LYRICA) 100 MG capsule Take 100 mg by mouth 2 (two) times daily.    . sertraline (ZOLOFT) 100 MG tablet Take 100 mg by mouth Every morning.    . Tamsulosin HCl (FLOMAX) 0.4 MG CAPS Take 0.4 mg by mouth daily after breakfast.     . cephALEXin (KEFLEX) 500 MG capsule Take 500 mg by mouth 4 (four) times daily.    . clindamycin (CLEOCIN) 150 MG capsule 3 tabs PO TID x 10 days (Patient not taking: Reported on 10/01/2014) 90 capsule 0  . NEXIUM 40 MG capsule take 1 capsule by mouth twice a day (Patient not taking: Reported on 10/01/2014) 62 capsule 0  . potassium chloride (K-DUR) 10 MEQ tablet Take 2 tablets (20 mEq total) by mouth daily. (Patient not taking: Reported on 10/01/2014) 5 tablet 0  . potassium chloride (KLOR-CON) 20 MEQ packet Take 20 mEq by mouth 2 (two) times daily. 8 packet 0   No current facility-administered medications for this visit.    Allergies as of 10/01/2014 - Review Complete 10/01/2014  Allergen Reaction Noted  . Gabapentin Hives   . Ibuprofen Other (See Comments)   . Naproxen    . Zolpidem tartrate      Family History  Problem Relation Age of Onset  . Diabetes Mother   . Hypertension Mother   . Hypertension Father   . Diabetes Father   . Heart attack      mother, father, brother, sister all deceased due to MI  . Colon cancer Neg Hx   . Liver disease Neg Hx   . Anesthesia problems Neg Hx   . Hypotension Neg Hx   . Malignant  hyperthermia Neg Hx   . Pseudochol deficiency Neg Hx   . Colon polyps Neg Hx     History   Social History  . Marital Status: Single    Spouse Name: N/A  . Number of Children: 2  . Years of Education: N/A   Occupational History  . disabled    Social History Main Topics  . Smoking status: Never Smoker   .  Smokeless tobacco: Not on file     Comment: Never Smoked  . Alcohol Use: No  . Drug Use: No     Comment: history of cocaine, etoh, marijuana but denies any the last several years-12 yrs ago  . Sexual Activity: Not on file   Other Topics Concern  . None   Social History Narrative   Lives w/ son-23/22    Review of Systems: 10 point ROS negative except as per HPI.   Physical Exam: BP 125/82 mmHg  Pulse 57  Temp(Src) 98.5 F (36.9 C) (Oral)  Ht 5\' 10"  (1.778 m)  Wt 246 lb 9.6 oz (111.857 kg)  BMI 35.38 kg/m2 General:   Alert and oriented. Pleasant and cooperative. Well-nourished and well-developed.  Head:  Normocephalic and atraumatic. Eyes:  Without icterus, sclera clear and conjunctiva pink.  Cardiovascular:  S1, S2 present without murmurs appreciated. Normal pulses noted. Extremities without clubbing or edema. Respiratory:  Clear to auscultation bilaterally. No wheezes, rales, or rhonchi. No distress.  Gastrointestinal:  +BS, rounded, soft, non-tender and non-distended. No HSM noted. No guarding or rebound. No masses appreciated.  Rectal:  Deferred  Skin:  Intact without significant lesions or rashes. Neurologic:  Alert and oriented x4;  grossly normal neurologically. Psych:  Alert and cooperative. Normal mood and affect.    10/01/2014 8:27 AM

## 2014-10-01 NOTE — Assessment & Plan Note (Signed)
Patient complains of intermittent constipation which is generally responsive to MiraLAX. However he has been unable to get some because "I fallen on hard times." Constipation is likely due to pain medications related to chronic pain and orthopedic issues. I will provide him with samples of MiraLAX to last him until he can obtain additional supply.

## 2014-10-01 NOTE — Assessment & Plan Note (Signed)
Patient with a history of GERD and peptic ulcer disease. Currently his symptoms are not well controlled as he ran out of his Nexium 3 months ago and was unable to get into our office for a refill. I will provide a refill on his PPI and have him return in 3 months to evaluate his symptoms on therapy.

## 2014-10-01 NOTE — Progress Notes (Signed)
cc'ed to pcp °

## 2014-10-02 DIAGNOSIS — G894 Chronic pain syndrome: Secondary | ICD-10-CM | POA: Diagnosis not present

## 2014-10-03 DIAGNOSIS — G894 Chronic pain syndrome: Secondary | ICD-10-CM | POA: Diagnosis not present

## 2014-10-04 DIAGNOSIS — G894 Chronic pain syndrome: Secondary | ICD-10-CM | POA: Diagnosis not present

## 2014-10-05 DIAGNOSIS — G894 Chronic pain syndrome: Secondary | ICD-10-CM | POA: Diagnosis not present

## 2014-10-06 DIAGNOSIS — G894 Chronic pain syndrome: Secondary | ICD-10-CM | POA: Diagnosis not present

## 2014-10-07 DIAGNOSIS — G894 Chronic pain syndrome: Secondary | ICD-10-CM | POA: Diagnosis not present

## 2014-10-08 DIAGNOSIS — M545 Low back pain: Secondary | ICD-10-CM | POA: Diagnosis not present

## 2014-10-08 DIAGNOSIS — M5417 Radiculopathy, lumbosacral region: Secondary | ICD-10-CM | POA: Diagnosis not present

## 2014-10-08 DIAGNOSIS — G894 Chronic pain syndrome: Secondary | ICD-10-CM | POA: Diagnosis not present

## 2014-10-08 DIAGNOSIS — Z5181 Encounter for therapeutic drug level monitoring: Secondary | ICD-10-CM | POA: Diagnosis not present

## 2014-10-08 DIAGNOSIS — Z79899 Other long term (current) drug therapy: Secondary | ICD-10-CM | POA: Diagnosis not present

## 2014-10-08 DIAGNOSIS — M150 Primary generalized (osteo)arthritis: Secondary | ICD-10-CM | POA: Diagnosis not present

## 2014-10-09 DIAGNOSIS — I10 Essential (primary) hypertension: Secondary | ICD-10-CM | POA: Diagnosis not present

## 2014-10-09 DIAGNOSIS — Z951 Presence of aortocoronary bypass graft: Secondary | ICD-10-CM | POA: Diagnosis not present

## 2014-10-09 DIAGNOSIS — M5417 Radiculopathy, lumbosacral region: Secondary | ICD-10-CM | POA: Diagnosis not present

## 2014-10-09 DIAGNOSIS — I25810 Atherosclerosis of coronary artery bypass graft(s) without angina pectoris: Secondary | ICD-10-CM | POA: Diagnosis not present

## 2014-10-09 DIAGNOSIS — Z888 Allergy status to other drugs, medicaments and biological substances status: Secondary | ICD-10-CM | POA: Diagnosis not present

## 2014-10-09 DIAGNOSIS — G894 Chronic pain syndrome: Secondary | ICD-10-CM | POA: Diagnosis not present

## 2014-10-09 DIAGNOSIS — M1990 Unspecified osteoarthritis, unspecified site: Secondary | ICD-10-CM | POA: Diagnosis not present

## 2014-10-09 DIAGNOSIS — Z886 Allergy status to analgesic agent status: Secondary | ICD-10-CM | POA: Diagnosis not present

## 2014-10-09 DIAGNOSIS — E1140 Type 2 diabetes mellitus with diabetic neuropathy, unspecified: Secondary | ICD-10-CM | POA: Diagnosis not present

## 2014-10-10 DIAGNOSIS — I2510 Atherosclerotic heart disease of native coronary artery without angina pectoris: Secondary | ICD-10-CM | POA: Diagnosis not present

## 2014-10-10 DIAGNOSIS — E784 Other hyperlipidemia: Secondary | ICD-10-CM | POA: Diagnosis not present

## 2014-10-10 DIAGNOSIS — I10 Essential (primary) hypertension: Secondary | ICD-10-CM | POA: Diagnosis not present

## 2014-10-10 DIAGNOSIS — G894 Chronic pain syndrome: Secondary | ICD-10-CM | POA: Diagnosis not present

## 2014-10-10 DIAGNOSIS — J209 Acute bronchitis, unspecified: Secondary | ICD-10-CM | POA: Diagnosis not present

## 2014-10-10 DIAGNOSIS — E1140 Type 2 diabetes mellitus with diabetic neuropathy, unspecified: Secondary | ICD-10-CM | POA: Diagnosis not present

## 2014-10-10 DIAGNOSIS — Z125 Encounter for screening for malignant neoplasm of prostate: Secondary | ICD-10-CM | POA: Diagnosis not present

## 2014-10-11 DIAGNOSIS — G894 Chronic pain syndrome: Secondary | ICD-10-CM | POA: Diagnosis not present

## 2014-10-12 DIAGNOSIS — G894 Chronic pain syndrome: Secondary | ICD-10-CM | POA: Diagnosis not present

## 2014-10-13 DIAGNOSIS — G894 Chronic pain syndrome: Secondary | ICD-10-CM | POA: Diagnosis not present

## 2014-10-14 DIAGNOSIS — G894 Chronic pain syndrome: Secondary | ICD-10-CM | POA: Diagnosis not present

## 2014-10-15 DIAGNOSIS — G894 Chronic pain syndrome: Secondary | ICD-10-CM | POA: Diagnosis not present

## 2014-10-16 DIAGNOSIS — G894 Chronic pain syndrome: Secondary | ICD-10-CM | POA: Diagnosis not present

## 2014-10-17 DIAGNOSIS — G894 Chronic pain syndrome: Secondary | ICD-10-CM | POA: Diagnosis not present

## 2014-10-18 DIAGNOSIS — G894 Chronic pain syndrome: Secondary | ICD-10-CM | POA: Diagnosis not present

## 2014-10-19 DIAGNOSIS — G894 Chronic pain syndrome: Secondary | ICD-10-CM | POA: Diagnosis not present

## 2014-10-20 DIAGNOSIS — G894 Chronic pain syndrome: Secondary | ICD-10-CM | POA: Diagnosis not present

## 2014-10-21 DIAGNOSIS — G894 Chronic pain syndrome: Secondary | ICD-10-CM | POA: Diagnosis not present

## 2014-10-22 DIAGNOSIS — G894 Chronic pain syndrome: Secondary | ICD-10-CM | POA: Diagnosis not present

## 2014-10-23 DIAGNOSIS — G894 Chronic pain syndrome: Secondary | ICD-10-CM | POA: Diagnosis not present

## 2014-10-24 DIAGNOSIS — G894 Chronic pain syndrome: Secondary | ICD-10-CM | POA: Diagnosis not present

## 2014-10-25 DIAGNOSIS — G894 Chronic pain syndrome: Secondary | ICD-10-CM | POA: Diagnosis not present

## 2014-10-26 DIAGNOSIS — G894 Chronic pain syndrome: Secondary | ICD-10-CM | POA: Diagnosis not present

## 2014-10-27 DIAGNOSIS — G894 Chronic pain syndrome: Secondary | ICD-10-CM | POA: Diagnosis not present

## 2014-10-28 DIAGNOSIS — G894 Chronic pain syndrome: Secondary | ICD-10-CM | POA: Diagnosis not present

## 2014-10-29 DIAGNOSIS — G894 Chronic pain syndrome: Secondary | ICD-10-CM | POA: Diagnosis not present

## 2014-10-30 DIAGNOSIS — G894 Chronic pain syndrome: Secondary | ICD-10-CM | POA: Diagnosis not present

## 2014-10-31 ENCOUNTER — Encounter: Payer: Self-pay | Admitting: Nurse Practitioner

## 2014-10-31 DIAGNOSIS — G894 Chronic pain syndrome: Secondary | ICD-10-CM | POA: Diagnosis not present

## 2014-11-01 DIAGNOSIS — G894 Chronic pain syndrome: Secondary | ICD-10-CM | POA: Diagnosis not present

## 2014-11-02 DIAGNOSIS — G894 Chronic pain syndrome: Secondary | ICD-10-CM | POA: Diagnosis not present

## 2014-11-03 DIAGNOSIS — G894 Chronic pain syndrome: Secondary | ICD-10-CM | POA: Diagnosis not present

## 2014-11-04 DIAGNOSIS — Z5181 Encounter for therapeutic drug level monitoring: Secondary | ICD-10-CM | POA: Diagnosis not present

## 2014-11-04 DIAGNOSIS — M5417 Radiculopathy, lumbosacral region: Secondary | ICD-10-CM | POA: Diagnosis not present

## 2014-11-04 DIAGNOSIS — Z79899 Other long term (current) drug therapy: Secondary | ICD-10-CM | POA: Diagnosis not present

## 2014-11-04 DIAGNOSIS — M150 Primary generalized (osteo)arthritis: Secondary | ICD-10-CM | POA: Diagnosis not present

## 2014-11-04 DIAGNOSIS — G894 Chronic pain syndrome: Secondary | ICD-10-CM | POA: Diagnosis not present

## 2014-11-04 DIAGNOSIS — M545 Low back pain: Secondary | ICD-10-CM | POA: Diagnosis not present

## 2014-11-05 DIAGNOSIS — G894 Chronic pain syndrome: Secondary | ICD-10-CM | POA: Diagnosis not present

## 2014-11-06 DIAGNOSIS — G894 Chronic pain syndrome: Secondary | ICD-10-CM | POA: Diagnosis not present

## 2014-11-07 DIAGNOSIS — G894 Chronic pain syndrome: Secondary | ICD-10-CM | POA: Diagnosis not present

## 2014-11-08 DIAGNOSIS — G894 Chronic pain syndrome: Secondary | ICD-10-CM | POA: Diagnosis not present

## 2014-11-09 DIAGNOSIS — G894 Chronic pain syndrome: Secondary | ICD-10-CM | POA: Diagnosis not present

## 2014-11-10 DIAGNOSIS — G894 Chronic pain syndrome: Secondary | ICD-10-CM | POA: Diagnosis not present

## 2014-11-11 DIAGNOSIS — G894 Chronic pain syndrome: Secondary | ICD-10-CM | POA: Diagnosis not present

## 2014-11-11 DIAGNOSIS — J410 Simple chronic bronchitis: Secondary | ICD-10-CM | POA: Diagnosis not present

## 2014-11-12 DIAGNOSIS — G894 Chronic pain syndrome: Secondary | ICD-10-CM | POA: Diagnosis not present

## 2014-11-13 DIAGNOSIS — G894 Chronic pain syndrome: Secondary | ICD-10-CM | POA: Diagnosis not present

## 2014-11-14 DIAGNOSIS — G894 Chronic pain syndrome: Secondary | ICD-10-CM | POA: Diagnosis not present

## 2014-11-15 DIAGNOSIS — G894 Chronic pain syndrome: Secondary | ICD-10-CM | POA: Diagnosis not present

## 2014-11-16 DIAGNOSIS — G894 Chronic pain syndrome: Secondary | ICD-10-CM | POA: Diagnosis not present

## 2014-11-17 DIAGNOSIS — G894 Chronic pain syndrome: Secondary | ICD-10-CM | POA: Diagnosis not present

## 2014-11-18 DIAGNOSIS — G894 Chronic pain syndrome: Secondary | ICD-10-CM | POA: Diagnosis not present

## 2014-11-19 DIAGNOSIS — G894 Chronic pain syndrome: Secondary | ICD-10-CM | POA: Diagnosis not present

## 2014-11-20 DIAGNOSIS — G894 Chronic pain syndrome: Secondary | ICD-10-CM | POA: Diagnosis not present

## 2014-11-21 DIAGNOSIS — G894 Chronic pain syndrome: Secondary | ICD-10-CM | POA: Diagnosis not present

## 2014-11-22 DIAGNOSIS — G894 Chronic pain syndrome: Secondary | ICD-10-CM | POA: Diagnosis not present

## 2014-11-23 DIAGNOSIS — G894 Chronic pain syndrome: Secondary | ICD-10-CM | POA: Diagnosis not present

## 2014-11-24 DIAGNOSIS — G894 Chronic pain syndrome: Secondary | ICD-10-CM | POA: Diagnosis not present

## 2014-11-25 DIAGNOSIS — G894 Chronic pain syndrome: Secondary | ICD-10-CM | POA: Diagnosis not present

## 2014-11-26 DIAGNOSIS — G894 Chronic pain syndrome: Secondary | ICD-10-CM | POA: Diagnosis not present

## 2014-11-27 DIAGNOSIS — M5417 Radiculopathy, lumbosacral region: Secondary | ICD-10-CM | POA: Diagnosis not present

## 2014-11-27 DIAGNOSIS — G894 Chronic pain syndrome: Secondary | ICD-10-CM | POA: Diagnosis not present

## 2014-11-27 DIAGNOSIS — Z951 Presence of aortocoronary bypass graft: Secondary | ICD-10-CM | POA: Diagnosis not present

## 2014-11-27 DIAGNOSIS — Z886 Allergy status to analgesic agent status: Secondary | ICD-10-CM | POA: Diagnosis not present

## 2014-11-27 DIAGNOSIS — M1990 Unspecified osteoarthritis, unspecified site: Secondary | ICD-10-CM | POA: Diagnosis not present

## 2014-11-27 DIAGNOSIS — I25810 Atherosclerosis of coronary artery bypass graft(s) without angina pectoris: Secondary | ICD-10-CM | POA: Diagnosis not present

## 2014-11-27 DIAGNOSIS — Z888 Allergy status to other drugs, medicaments and biological substances status: Secondary | ICD-10-CM | POA: Diagnosis not present

## 2014-11-27 DIAGNOSIS — E119 Type 2 diabetes mellitus without complications: Secondary | ICD-10-CM | POA: Diagnosis not present

## 2014-11-27 DIAGNOSIS — I10 Essential (primary) hypertension: Secondary | ICD-10-CM | POA: Diagnosis not present

## 2014-11-28 DIAGNOSIS — G894 Chronic pain syndrome: Secondary | ICD-10-CM | POA: Diagnosis not present

## 2014-11-29 DIAGNOSIS — G894 Chronic pain syndrome: Secondary | ICD-10-CM | POA: Diagnosis not present

## 2014-11-30 DIAGNOSIS — G894 Chronic pain syndrome: Secondary | ICD-10-CM | POA: Diagnosis not present

## 2014-12-01 DIAGNOSIS — G894 Chronic pain syndrome: Secondary | ICD-10-CM | POA: Diagnosis not present

## 2014-12-02 DIAGNOSIS — G894 Chronic pain syndrome: Secondary | ICD-10-CM | POA: Diagnosis not present

## 2014-12-03 DIAGNOSIS — G894 Chronic pain syndrome: Secondary | ICD-10-CM | POA: Diagnosis not present

## 2014-12-04 DIAGNOSIS — G894 Chronic pain syndrome: Secondary | ICD-10-CM | POA: Diagnosis not present

## 2014-12-05 DIAGNOSIS — G894 Chronic pain syndrome: Secondary | ICD-10-CM | POA: Diagnosis not present

## 2014-12-06 DIAGNOSIS — J410 Simple chronic bronchitis: Secondary | ICD-10-CM | POA: Diagnosis not present

## 2014-12-06 DIAGNOSIS — G894 Chronic pain syndrome: Secondary | ICD-10-CM | POA: Diagnosis not present

## 2014-12-07 DIAGNOSIS — G894 Chronic pain syndrome: Secondary | ICD-10-CM | POA: Diagnosis not present

## 2014-12-08 DIAGNOSIS — G894 Chronic pain syndrome: Secondary | ICD-10-CM | POA: Diagnosis not present

## 2014-12-09 DIAGNOSIS — G894 Chronic pain syndrome: Secondary | ICD-10-CM | POA: Diagnosis not present

## 2014-12-10 DIAGNOSIS — G894 Chronic pain syndrome: Secondary | ICD-10-CM | POA: Diagnosis not present

## 2014-12-11 DIAGNOSIS — G894 Chronic pain syndrome: Secondary | ICD-10-CM | POA: Diagnosis not present

## 2014-12-12 DIAGNOSIS — G894 Chronic pain syndrome: Secondary | ICD-10-CM | POA: Diagnosis not present

## 2014-12-13 DIAGNOSIS — G894 Chronic pain syndrome: Secondary | ICD-10-CM | POA: Diagnosis not present

## 2014-12-14 DIAGNOSIS — G894 Chronic pain syndrome: Secondary | ICD-10-CM | POA: Diagnosis not present

## 2014-12-15 DIAGNOSIS — G894 Chronic pain syndrome: Secondary | ICD-10-CM | POA: Diagnosis not present

## 2014-12-16 DIAGNOSIS — G894 Chronic pain syndrome: Secondary | ICD-10-CM | POA: Diagnosis not present

## 2014-12-17 DIAGNOSIS — G894 Chronic pain syndrome: Secondary | ICD-10-CM | POA: Diagnosis not present

## 2014-12-18 DIAGNOSIS — G894 Chronic pain syndrome: Secondary | ICD-10-CM | POA: Diagnosis not present

## 2014-12-19 DIAGNOSIS — G894 Chronic pain syndrome: Secondary | ICD-10-CM | POA: Diagnosis not present

## 2014-12-20 DIAGNOSIS — Z5181 Encounter for therapeutic drug level monitoring: Secondary | ICD-10-CM | POA: Diagnosis not present

## 2014-12-20 DIAGNOSIS — Z9114 Patient's other noncompliance with medication regimen: Secondary | ICD-10-CM | POA: Diagnosis not present

## 2014-12-20 DIAGNOSIS — Z79899 Other long term (current) drug therapy: Secondary | ICD-10-CM | POA: Diagnosis not present

## 2014-12-20 DIAGNOSIS — M5417 Radiculopathy, lumbosacral region: Secondary | ICD-10-CM | POA: Diagnosis not present

## 2014-12-20 DIAGNOSIS — M545 Low back pain: Secondary | ICD-10-CM | POA: Diagnosis not present

## 2014-12-20 DIAGNOSIS — G894 Chronic pain syndrome: Secondary | ICD-10-CM | POA: Diagnosis not present

## 2014-12-21 DIAGNOSIS — G894 Chronic pain syndrome: Secondary | ICD-10-CM | POA: Diagnosis not present

## 2014-12-22 DIAGNOSIS — G894 Chronic pain syndrome: Secondary | ICD-10-CM | POA: Diagnosis not present

## 2014-12-23 DIAGNOSIS — G894 Chronic pain syndrome: Secondary | ICD-10-CM | POA: Diagnosis not present

## 2014-12-24 DIAGNOSIS — G894 Chronic pain syndrome: Secondary | ICD-10-CM | POA: Diagnosis not present

## 2014-12-25 DIAGNOSIS — G894 Chronic pain syndrome: Secondary | ICD-10-CM | POA: Diagnosis not present

## 2014-12-26 DIAGNOSIS — G894 Chronic pain syndrome: Secondary | ICD-10-CM | POA: Diagnosis not present

## 2014-12-27 DIAGNOSIS — G894 Chronic pain syndrome: Secondary | ICD-10-CM | POA: Diagnosis not present

## 2014-12-28 DIAGNOSIS — G894 Chronic pain syndrome: Secondary | ICD-10-CM | POA: Diagnosis not present

## 2014-12-29 DIAGNOSIS — G894 Chronic pain syndrome: Secondary | ICD-10-CM | POA: Diagnosis not present

## 2014-12-30 DIAGNOSIS — G894 Chronic pain syndrome: Secondary | ICD-10-CM | POA: Diagnosis not present

## 2014-12-31 DIAGNOSIS — G894 Chronic pain syndrome: Secondary | ICD-10-CM | POA: Diagnosis not present

## 2015-01-01 ENCOUNTER — Ambulatory Visit: Payer: Medicare Other | Admitting: Nurse Practitioner

## 2015-01-01 DIAGNOSIS — G894 Chronic pain syndrome: Secondary | ICD-10-CM | POA: Diagnosis not present

## 2015-01-01 DIAGNOSIS — J410 Simple chronic bronchitis: Secondary | ICD-10-CM | POA: Diagnosis not present

## 2015-01-02 DIAGNOSIS — G894 Chronic pain syndrome: Secondary | ICD-10-CM | POA: Diagnosis not present

## 2015-01-03 DIAGNOSIS — G894 Chronic pain syndrome: Secondary | ICD-10-CM | POA: Diagnosis not present

## 2015-01-04 DIAGNOSIS — G894 Chronic pain syndrome: Secondary | ICD-10-CM | POA: Diagnosis not present

## 2015-01-05 DIAGNOSIS — G894 Chronic pain syndrome: Secondary | ICD-10-CM | POA: Diagnosis not present

## 2015-01-06 DIAGNOSIS — G894 Chronic pain syndrome: Secondary | ICD-10-CM | POA: Diagnosis not present

## 2015-01-07 DIAGNOSIS — G894 Chronic pain syndrome: Secondary | ICD-10-CM | POA: Diagnosis not present

## 2015-01-08 DIAGNOSIS — G894 Chronic pain syndrome: Secondary | ICD-10-CM | POA: Diagnosis not present

## 2015-01-09 DIAGNOSIS — G894 Chronic pain syndrome: Secondary | ICD-10-CM | POA: Diagnosis not present

## 2015-01-09 DIAGNOSIS — K219 Gastro-esophageal reflux disease without esophagitis: Secondary | ICD-10-CM | POA: Diagnosis not present

## 2015-01-09 DIAGNOSIS — G5610 Other lesions of median nerve, unspecified upper limb: Secondary | ICD-10-CM | POA: Diagnosis not present

## 2015-01-09 DIAGNOSIS — E784 Other hyperlipidemia: Secondary | ICD-10-CM | POA: Diagnosis not present

## 2015-01-09 DIAGNOSIS — E1140 Type 2 diabetes mellitus with diabetic neuropathy, unspecified: Secondary | ICD-10-CM | POA: Diagnosis not present

## 2015-01-09 DIAGNOSIS — I10 Essential (primary) hypertension: Secondary | ICD-10-CM | POA: Diagnosis not present

## 2015-01-10 DIAGNOSIS — G894 Chronic pain syndrome: Secondary | ICD-10-CM | POA: Diagnosis not present

## 2015-01-11 DIAGNOSIS — G894 Chronic pain syndrome: Secondary | ICD-10-CM | POA: Diagnosis not present

## 2015-01-12 DIAGNOSIS — G894 Chronic pain syndrome: Secondary | ICD-10-CM | POA: Diagnosis not present

## 2015-01-21 DIAGNOSIS — M5417 Radiculopathy, lumbosacral region: Secondary | ICD-10-CM | POA: Diagnosis not present

## 2015-01-21 DIAGNOSIS — M545 Low back pain: Secondary | ICD-10-CM | POA: Diagnosis not present

## 2015-01-21 DIAGNOSIS — Z9114 Patient's other noncompliance with medication regimen: Secondary | ICD-10-CM | POA: Diagnosis not present

## 2015-01-21 DIAGNOSIS — G894 Chronic pain syndrome: Secondary | ICD-10-CM | POA: Diagnosis not present

## 2015-01-24 ENCOUNTER — Inpatient Hospital Stay (HOSPITAL_COMMUNITY)
Admission: EM | Admit: 2015-01-24 | Discharge: 2015-01-25 | DRG: 918 | Disposition: A | Discharge status: Home / Self Care | Payer: Medicare Other | Attending: Cardiology | Admitting: Cardiology

## 2015-01-24 ENCOUNTER — Telehealth: Payer: Self-pay | Admitting: Gastroenterology

## 2015-01-24 ENCOUNTER — Encounter: Payer: Self-pay | Admitting: Gastroenterology

## 2015-01-24 ENCOUNTER — Encounter (HOSPITAL_COMMUNITY): Payer: Self-pay | Admitting: Emergency Medicine

## 2015-01-24 ENCOUNTER — Ambulatory Visit: Payer: Medicare Other | Admitting: Gastroenterology

## 2015-01-24 ENCOUNTER — Emergency Department (HOSPITAL_COMMUNITY): Payer: Medicare Other

## 2015-01-24 DIAGNOSIS — F329 Major depressive disorder, single episode, unspecified: Secondary | ICD-10-CM | POA: Diagnosis present

## 2015-01-24 DIAGNOSIS — R0789 Other chest pain: Secondary | ICD-10-CM | POA: Diagnosis present

## 2015-01-24 DIAGNOSIS — Z7982 Long term (current) use of aspirin: Secondary | ICD-10-CM

## 2015-01-24 DIAGNOSIS — Z951 Presence of aortocoronary bypass graft: Secondary | ICD-10-CM

## 2015-01-24 DIAGNOSIS — E7800 Pure hypercholesterolemia, unspecified: Secondary | ICD-10-CM | POA: Diagnosis not present

## 2015-01-24 DIAGNOSIS — I200 Unstable angina: Secondary | ICD-10-CM

## 2015-01-24 DIAGNOSIS — K219 Gastro-esophageal reflux disease without esophagitis: Secondary | ICD-10-CM | POA: Diagnosis not present

## 2015-01-24 DIAGNOSIS — I110 Hypertensive heart disease with heart failure: Secondary | ICD-10-CM | POA: Diagnosis present

## 2015-01-24 DIAGNOSIS — I2510 Atherosclerotic heart disease of native coronary artery without angina pectoris: Secondary | ICD-10-CM | POA: Diagnosis not present

## 2015-01-24 DIAGNOSIS — M1990 Unspecified osteoarthritis, unspecified site: Secondary | ICD-10-CM | POA: Diagnosis not present

## 2015-01-24 DIAGNOSIS — M549 Dorsalgia, unspecified: Secondary | ICD-10-CM | POA: Diagnosis present

## 2015-01-24 DIAGNOSIS — Z79899 Other long term (current) drug therapy: Secondary | ICD-10-CM

## 2015-01-24 DIAGNOSIS — I509 Heart failure, unspecified: Secondary | ICD-10-CM | POA: Diagnosis present

## 2015-01-24 DIAGNOSIS — I252 Old myocardial infarction: Secondary | ICD-10-CM | POA: Diagnosis not present

## 2015-01-24 DIAGNOSIS — F1410 Cocaine abuse, uncomplicated: Secondary | ICD-10-CM | POA: Diagnosis not present

## 2015-01-24 DIAGNOSIS — I471 Supraventricular tachycardia: Secondary | ICD-10-CM | POA: Diagnosis not present

## 2015-01-24 DIAGNOSIS — T405X1A Poisoning by cocaine, accidental (unintentional), initial encounter: Principal | ICD-10-CM | POA: Diagnosis present

## 2015-01-24 DIAGNOSIS — G8929 Other chronic pain: Secondary | ICD-10-CM | POA: Diagnosis present

## 2015-01-24 DIAGNOSIS — Z7984 Long term (current) use of oral hypoglycemic drugs: Secondary | ICD-10-CM | POA: Diagnosis not present

## 2015-01-24 DIAGNOSIS — E119 Type 2 diabetes mellitus without complications: Secondary | ICD-10-CM | POA: Diagnosis not present

## 2015-01-24 DIAGNOSIS — Z9114 Patient's other noncompliance with medication regimen: Secondary | ICD-10-CM | POA: Diagnosis not present

## 2015-01-24 DIAGNOSIS — E876 Hypokalemia: Secondary | ICD-10-CM | POA: Diagnosis not present

## 2015-01-24 DIAGNOSIS — R079 Chest pain, unspecified: Secondary | ICD-10-CM | POA: Diagnosis not present

## 2015-01-24 LAB — I-STAT TROPONIN, ED: Troponin i, poc: 0 ng/mL (ref 0.00–0.08)

## 2015-01-24 LAB — I-STAT CHEM 8, ED
BUN: 15 mg/dL (ref 6–20)
CHLORIDE: 102 mmol/L (ref 101–111)
Calcium, Ion: 1.17 mmol/L (ref 1.12–1.23)
Creatinine, Ser: 1.3 mg/dL — ABNORMAL HIGH (ref 0.61–1.24)
GLUCOSE: 89 mg/dL (ref 65–99)
HCT: 40 % (ref 39.0–52.0)
Hemoglobin: 13.6 g/dL (ref 13.0–17.0)
Potassium: 3.3 mmol/L — ABNORMAL LOW (ref 3.5–5.1)
Sodium: 144 mmol/L (ref 135–145)
TCO2: 27 mmol/L (ref 0–100)

## 2015-01-24 LAB — PROTIME-INR
INR: 1.14 (ref 0.00–1.49)
Prothrombin Time: 14.8 seconds (ref 11.6–15.2)

## 2015-01-24 LAB — CBC WITH DIFFERENTIAL/PLATELET
Basophils Absolute: 0 10*3/uL (ref 0.0–0.1)
Basophils Relative: 0 %
Eosinophils Absolute: 0.1 10*3/uL (ref 0.0–0.7)
Eosinophils Relative: 1 %
HCT: 37.4 % — ABNORMAL LOW (ref 39.0–52.0)
Hemoglobin: 12.8 g/dL — ABNORMAL LOW (ref 13.0–17.0)
LYMPHS PCT: 30 %
Lymphs Abs: 2.2 10*3/uL (ref 0.7–4.0)
MCH: 29.6 pg (ref 26.0–34.0)
MCHC: 34.2 g/dL (ref 30.0–36.0)
MCV: 86.6 fL (ref 78.0–100.0)
MONO ABS: 0.3 10*3/uL (ref 0.1–1.0)
Monocytes Relative: 5 %
Neutro Abs: 4.9 10*3/uL (ref 1.7–7.7)
Neutrophils Relative %: 64 %
PLATELETS: 192 10*3/uL (ref 150–400)
RBC: 4.32 MIL/uL (ref 4.22–5.81)
RDW: 13.9 % (ref 11.5–15.5)
WBC: 7.5 10*3/uL (ref 4.0–10.5)

## 2015-01-24 MED ORDER — NITROGLYCERIN 0.4 MG SL SUBL
0.4000 mg | SUBLINGUAL_TABLET | SUBLINGUAL | Status: AC | PRN
  Administered 2015-01-24 (×3): 0.4 mg via SUBLINGUAL
  Filled 2015-01-24: qty 1

## 2015-01-24 NOTE — ED Notes (Signed)
Pt states acute onset of left sided chest pain that started about five days ago. Pt admits to cocaine use yesterday.

## 2015-01-24 NOTE — Telephone Encounter (Signed)
PATIENT WAS A NO SHOW AND LETTER SENT  °

## 2015-01-24 NOTE — ED Provider Notes (Signed)
CSN: [id number]     Arrival date & time 01/24/15  2042 History   First MD Initiated Contact with Patient 01/24/15 2221     Chief Complaint  Patient presents with  . Chest Pain     (Consider location/radiation/quality/duration/timing/severity/associated sxs/prior Treatment) HPI Complete of anterior chest pain radiated to his left arm typical heart pain which he has had for the past 5 or 6 days pain waxes and wanes. Associated symptoms include shortness of breath. He was treated with 4 baby aspirins prior to coming here. No other associated symptoms. Pain is presently 7 on a scale 1-10. Patient admits to smoking cocaine yesterday. Past Medical History  Diagnosis Date  . Hypertension   . High cholesterol   . Congestive heart failure (CHF) ([place])   . Arthritis   . MI (myocardial infarction) ([place])     7, last one was in 2011  . PUD (peptic ulcer disease)     in 1990s, secondary to medication  . Laceration of right hand 11/27/10  . Chronic back pain     Pain Clinic in [place]  . Chronic bronchitis   . CHF (congestive heart failure) ([place])   . Bronchitis, chronic ([place])   . Anemia   . GERD (gastroesophageal reflux disease)   . Frequency of urination   . Seizures ([place])     entire life, last seizure in 2011;unknown etiology-pt sts heriditary  . Tonsillitis, chronic     Dr. Vicki Mallet in [place]  . Shortness of breath     with exertion  . Depression   . Headache(784.0)   . Laceration of wrist 2007 BIL FOREARMS  . Coronary artery disease    Past Surgical History  Procedure Laterality Date  . Carpal tunnel release      bilateral  . Knee surgery      left-plate to left knee cap from accident  . Coronary artery bypass graft  2002    3 vessels  . Back surgery      3-  . Savory dilation  12/28/2010    SLF:(MAC)J-shaped stomach/nodular mocosa in the distal esophagus/empiric dilation 48mm  . Colonoscopy  12/28/10    SLF: (MAC)Internal hemorrhoids/four small colon polyps  .  Esophagogastroduodenoscopy N/A 05/30/2012    SLF: UNCONTROLLED GERD DUE TO LIFESTYLE CHOICE/WEIGHT GAIN/MILD Non-erosive gastritis  . Esophageal biopsy N/A 05/30/2012    Procedure: BIOPSY;  Surgeon: Danie Binder, MD;  Location: [facility];  Service: Endoscopy;  Laterality: N/A;  . Left heart catheterization with coronary angiogram N/A 08/31/2011    Procedure: LEFT HEART CATHETERIZATION WITH CORONARY ANGIOGRAM;  Surgeon: Laverda Page, MD;  Location: [facility] CATH LAB;  Service: Cardiovascular;  Laterality: N/A;   Family History  Problem Relation Age of Onset  . Diabetes Mother   . Hypertension Mother   . Hypertension Father   . Diabetes Father   . Heart attack      mother, father, brother, sister all deceased due to MI  . Colon cancer Neg Hx   . Liver disease Neg Hx   . Anesthesia problems Neg Hx   . Hypotension Neg Hx   . Malignant hyperthermia Neg Hx   . Pseudochol deficiency Neg Hx   . Colon polyps Neg Hx    Social History  Substance Use Topics  . Smoking status: Never Smoker   . Smokeless tobacco: None     Comment: Never Smoked  . Alcohol Use: No    Review of Systems  Constitutional: Negative.  HENT: Negative.   Respiratory: Positive for shortness of breath.   Cardiovascular: Positive for chest pain.  Gastrointestinal: Negative.   Musculoskeletal: Negative.   Skin: Negative.   Allergic/Immunologic: Positive for immunocompromised state.       Diabetic  Neurological: Negative.   Psychiatric/Behavioral: Negative.   All other systems reviewed and are negative.     Allergies  Gabapentin; Ibuprofen; Naproxen; and Zolpidem tartrate  Home Medications   Prior to Admission medications   Medication Sig Start Date End Date Taking? Authorizing Provider  albuterol (PROAIR HFA) 108 (90 BASE) MCG/ACT inhaler Inhale 2 puffs into the lungs every 6 (six) hours as needed for wheezing (for shortness of breath).   Yes Historical Provider, MD  albuterol (PROVENTIL) (2.5 MG/3ML) 0.083%  nebulizer solution Take 2.5 mg by nebulization 2 (two) times daily.   Yes Historical Provider, MD  alprazolam Duanne Moron) 2 MG tablet Take 2 mg by mouth Twice daily. 03/15/11  Yes Historical Provider, MD  aspirin 325 MG EC tablet Take 325 mg by mouth daily.     Yes Historical Provider, MD  atorvastatin (LIPITOR) 10 MG tablet Take 10 mg by mouth every morning.    Yes Historical Provider, MD  beclomethasone (QVAR) 80 MCG/ACT inhaler Inhale 2 puffs into the lungs 2 (two) times daily.   Yes Historical Provider, MD  cetirizine (ZYRTEC) 10 MG tablet Take 10 mg by mouth daily as needed for allergies or rhinitis.    Yes Historical Provider, MD  esomeprazole (NEXIUM) 40 MG capsule Take 1 capsule (40 mg total) by mouth 2 (two) times daily. 10/01/14  Yes Carlis Stable, NP  furosemide (LASIX) 40 MG tablet Take 40 mg by mouth daily.     Yes Historical Provider, MD  hydrochlorothiazide (HYDRODIURIL) 25 MG tablet Take 25 mg by mouth daily.   Yes Historical Provider, MD  ipratropium (ATROVENT) 0.02 % nebulizer solution Take 500 mcg by nebulization 2 (two) times daily.   Yes Historical Provider, MD  lamoTRIgine (LAMICTAL) 200 MG tablet Take 200 mg by mouth daily.   Yes Historical Provider, MD  levETIRAcetam (KEPPRA) 500 MG tablet Take 500 mg by mouth every 12 (twelve) hours.     Yes Historical Provider, MD  lisinopril (PRINIVIL,ZESTRIL) 10 MG tablet Take 5 mg by mouth daily. Take half of a tablet   Yes Historical Provider, MD  metFORMIN (GLUCOPHAGE) 1000 MG tablet Take 1,000 mg by mouth daily with breakfast.   Yes Historical Provider, MD  metoprolol tartrate (LOPRESSOR) 25 MG tablet Take 25 mg by mouth 2 (two) times daily.     Yes Historical Provider, MD  oxyCODONE-acetaminophen (PERCOCET) 10-325 MG per tablet Take 1 tablet by mouth 4 (four) times daily as needed. pain   Yes Historical Provider, MD  potassium chloride (KLOR-CON) 20 MEQ packet Take 20 mEq by mouth 2 (two) times daily. 05/18/11 01/24/15 Yes Rolland Porter, MD   pregabalin (LYRICA) 100 MG capsule Take 100 mg by mouth 2 (two) times daily.   Yes Historical Provider, MD  sertraline (ZOLOFT) 100 MG tablet Take 100 mg by mouth Every morning. 03/15/11  Yes Historical Provider, MD  Tamsulosin HCl (FLOMAX) 0.4 MG CAPS Take 0.4 mg by mouth daily after breakfast.    Yes Historical Provider, MD  potassium chloride (K-DUR) 10 MEQ tablet Take 2 tablets (20 mEq total) by mouth daily. Patient not taking: Reported on 10/01/2014 09/07/12   Noemi Chapel, MD   BP 116/94 mmHg  Pulse 80  Resp 23  SpO2 99% Physical Exam  Constitutional: He is oriented to person, place, and time. He appears well-developed and well-nourished. He appears distressed.  Appears mildly uncomfortable  HENT:  Head: Normocephalic and atraumatic.  Eyes: Conjunctivae are normal. Pupils are equal, round, and reactive to light.  Neck: Neck supple. No tracheal deviation present. No thyromegaly present.  Cardiovascular: Normal rate.   No murmur heard. Irregularly irregular  Pulmonary/Chest: Effort normal and breath sounds normal.  Abdominal: Soft. Bowel sounds are normal. He exhibits no distension. There is no tenderness.  Obese  Musculoskeletal: Normal range of motion. He exhibits no edema or tenderness.  Neurological: He is alert and oriented to person, place, and time. No cranial nerve deficit. Coordination normal.  Skin: Skin is warm and dry. No rash noted.  Psychiatric: He has a normal mood and affect.  Nursing note and vitals reviewed.   ED Course  Procedures (including critical care time) Labs Review Labs Reviewed  CBC WITH DIFFERENTIAL/PLATELET  PROTIME-INR  I-STAT TROPOININ, ED  I-STAT CHEM 8, ED    Imaging Review No results found. I have personally reviewed and evaluated these images and lab results as part of my medical decision-making.   EKG Interpretation   Date/Time:  Friday January 24 2015 21:56:50 EST Ventricular Rate:  135 PR Interval:    QRS Duration: 84 QT  Interval:  360 QTC Calculation: 540 R Axis:   74 Text Interpretation:  Atrial flutter/fibrillation Borderline T wave  abnormalities Confirmed by Winfred Leeds  MD, Charo Philipp ([id number]) on 01/24/2015  10:44:06 PM     12 midnight pain is improved from 9 on a scale of 1-10.5 on scale 1-10 after treatment with 370 nitroglycerin. Intravenous nitroglycerin drip ordered. Chest xray viewed by me. Results for orders placed or performed during the hospital encounter of 01/24/15  CBC with Differential/Platelet  Result Value Ref Range   WBC 7.5 4.0 - 10.5 K/uL   RBC 4.32 4.22 - 5.81 MIL/uL   Hemoglobin 12.8 (L) 13.0 - 17.0 g/dL   HCT 37.4 (L) 39.0 - 52.0 %   MCV 86.6 78.0 - 100.0 fL   MCH 29.6 26.0 - 34.0 pg   MCHC 34.2 30.0 - 36.0 g/dL   RDW 13.9 11.5 - 15.5 %   Platelets 192 150 - 400 K/uL   Neutrophils Relative % 64 %   Neutro Abs 4.9 1.7 - 7.7 K/uL   Lymphocytes Relative 30 %   Lymphs Abs 2.2 0.7 - 4.0 K/uL   Monocytes Relative 5 %   Monocytes Absolute 0.3 0.1 - 1.0 K/uL   Eosinophils Relative 1 %   Eosinophils Absolute 0.1 0.0 - 0.7 K/uL   Basophils Relative 0 %   Basophils Absolute 0.0 0.0 - 0.1 K/uL  Protime-INR  Result Value Ref Range   Prothrombin Time 14.8 11.6 - 15.2 seconds   INR 1.14 0.00 - 1.49  I-stat troponin, ED  Result Value Ref Range   Troponin i, poc 0.00 0.00 - 0.08 ng/mL   Comment 3          I-stat chem 8, ed  Result Value Ref Range   Sodium 144 135 - 145 mmol/L   Potassium 3.3 (L) 3.5 - 5.1 mmol/L   Chloride 102 101 - 111 mmol/L   BUN 15 6 - 20 mg/dL   Creatinine, Ser 1.30 (H) 0.61 - 1.24 mg/dL   Glucose, Bld 89 65 - 99 mg/dL   Calcium, Ion 1.17 1.12 - 1.23 mmol/L   TCO2 27 0 - 100 mmol/L   Hemoglobin 13.6 13.0 -  17.0 g/dL   HCT 40.0 39.0 - 52.0 %   Dg Chest Port 1 View  01/24/2015  CLINICAL DATA:  Left chest pain radiating into the left arm for 1 week. EXAM: PORTABLE CHEST 1 VIEW COMPARISON:  09/07/2012 FINDINGS: A single AP portable view of the chest demonstrates  no focal airspace consolidation or alveolar edema. The lungs are grossly clear. There is no large effusion or pneumothorax. Cardiac and mediastinal contours appear unremarkable. IMPRESSION: No active disease. Electronically Signed   By: Andreas Newport M.D.   On: 01/24/2015 23:19   1235 am pain improving on iv nitroglycerin drip MDM  Symptoms are consistent with unstable angina. I consulted with Dr. Nadyne Coombes request Lovenox per pharmacy protocol. Dr. Nadyne Coombes will see pt on the floor. Note pt reports he is in warfarin and may be missing doseds Final diagnoses:  None   plan admit to stepdown unit Dx #1 unstable angina #2 hypokalemia #3 cocaine abuse #4 renal insufficiency #5 medication non cvompliance CRITICAL CARE Performed by: Orlie Dakin Total critical care time: 50 minutes Critical care time was exclusive of separately billable procedures and treating other patients. Critical care was necessary to treat or prevent imminent or life-threatening deterioration. Critical care was time spent personally by me on the following activities: development of treatment plan with patient and/or surrogate as well as nursing, discussions with consultants, evaluation of patient's response to treatment, examination of patient, obtaining history from patient or surrogate, ordering and performing treatments and interventions, ordering and review of laboratory studies, ordering and review of radiographic studies, pulse oximetry and re-evaluation of patient's condition.    Orlie Dakin, MD 01/25/15 [phone]

## 2015-01-25 ENCOUNTER — Emergency Department (HOSPITAL_COMMUNITY)
Admission: EM | Admit: 2015-01-25 | Discharge: 2015-01-26 | Disposition: A | Discharge status: Other Acute Inpatient Hospital | Payer: Medicare Other | Attending: Emergency Medicine | Admitting: Emergency Medicine

## 2015-01-25 ENCOUNTER — Other Ambulatory Visit: Payer: Self-pay

## 2015-01-25 ENCOUNTER — Encounter (HOSPITAL_COMMUNITY): Payer: Self-pay | Admitting: Emergency Medicine

## 2015-01-25 DIAGNOSIS — Z862 Personal history of diseases of the blood and blood-forming organs and certain disorders involving the immune mechanism: Secondary | ICD-10-CM | POA: Diagnosis not present

## 2015-01-25 DIAGNOSIS — I2510 Atherosclerotic heart disease of native coronary artery without angina pectoris: Secondary | ICD-10-CM | POA: Diagnosis not present

## 2015-01-25 DIAGNOSIS — Z951 Presence of aortocoronary bypass graft: Secondary | ICD-10-CM | POA: Insufficient documentation

## 2015-01-25 DIAGNOSIS — R0789 Other chest pain: Secondary | ICD-10-CM | POA: Diagnosis not present

## 2015-01-25 DIAGNOSIS — I110 Hypertensive heart disease with heart failure: Secondary | ICD-10-CM | POA: Diagnosis not present

## 2015-01-25 DIAGNOSIS — Z7951 Long term (current) use of inhaled steroids: Secondary | ICD-10-CM | POA: Diagnosis not present

## 2015-01-25 DIAGNOSIS — I200 Unstable angina: Secondary | ICD-10-CM | POA: Diagnosis present

## 2015-01-25 DIAGNOSIS — E7800 Pure hypercholesterolemia, unspecified: Secondary | ICD-10-CM | POA: Insufficient documentation

## 2015-01-25 DIAGNOSIS — F1414 Cocaine abuse with cocaine-induced mood disorder: Secondary | ICD-10-CM | POA: Insufficient documentation

## 2015-01-25 DIAGNOSIS — M1990 Unspecified osteoarthritis, unspecified site: Secondary | ICD-10-CM | POA: Insufficient documentation

## 2015-01-25 DIAGNOSIS — F1494 Cocaine use, unspecified with cocaine-induced mood disorder: Secondary | ICD-10-CM | POA: Diagnosis not present

## 2015-01-25 DIAGNOSIS — Z79899 Other long term (current) drug therapy: Secondary | ICD-10-CM | POA: Insufficient documentation

## 2015-01-25 DIAGNOSIS — I509 Heart failure, unspecified: Secondary | ICD-10-CM | POA: Diagnosis not present

## 2015-01-25 DIAGNOSIS — I10 Essential (primary) hypertension: Secondary | ICD-10-CM | POA: Insufficient documentation

## 2015-01-25 DIAGNOSIS — T405X1A Poisoning by cocaine, accidental (unintentional), initial encounter: Secondary | ICD-10-CM | POA: Diagnosis not present

## 2015-01-25 DIAGNOSIS — Z8711 Personal history of peptic ulcer disease: Secondary | ICD-10-CM | POA: Diagnosis not present

## 2015-01-25 DIAGNOSIS — Z9889 Other specified postprocedural states: Secondary | ICD-10-CM | POA: Diagnosis not present

## 2015-01-25 DIAGNOSIS — G8929 Other chronic pain: Secondary | ICD-10-CM | POA: Diagnosis not present

## 2015-01-25 DIAGNOSIS — F1410 Cocaine abuse, uncomplicated: Secondary | ICD-10-CM | POA: Diagnosis present

## 2015-01-25 DIAGNOSIS — K219 Gastro-esophageal reflux disease without esophagitis: Secondary | ICD-10-CM | POA: Diagnosis not present

## 2015-01-25 DIAGNOSIS — I252 Old myocardial infarction: Secondary | ICD-10-CM | POA: Diagnosis not present

## 2015-01-25 DIAGNOSIS — R079 Chest pain, unspecified: Secondary | ICD-10-CM | POA: Diagnosis present

## 2015-01-25 DIAGNOSIS — R45851 Suicidal ideations: Secondary | ICD-10-CM | POA: Diagnosis not present

## 2015-01-25 DIAGNOSIS — F329 Major depressive disorder, single episode, unspecified: Secondary | ICD-10-CM | POA: Diagnosis not present

## 2015-01-25 DIAGNOSIS — Z87828 Personal history of other (healed) physical injury and trauma: Secondary | ICD-10-CM | POA: Insufficient documentation

## 2015-01-25 DIAGNOSIS — I471 Supraventricular tachycardia: Secondary | ICD-10-CM | POA: Diagnosis not present

## 2015-01-25 DIAGNOSIS — M549 Dorsalgia, unspecified: Secondary | ICD-10-CM | POA: Diagnosis present

## 2015-01-25 DIAGNOSIS — Z008 Encounter for other general examination: Secondary | ICD-10-CM | POA: Diagnosis present

## 2015-01-25 DIAGNOSIS — E119 Type 2 diabetes mellitus without complications: Secondary | ICD-10-CM | POA: Diagnosis not present

## 2015-01-25 DIAGNOSIS — Z7982 Long term (current) use of aspirin: Secondary | ICD-10-CM | POA: Insufficient documentation

## 2015-01-25 DIAGNOSIS — Z7984 Long term (current) use of oral hypoglycemic drugs: Secondary | ICD-10-CM | POA: Diagnosis not present

## 2015-01-25 LAB — CBC
HEMATOCRIT: 38.1 % — AB (ref 39.0–52.0)
HEMOGLOBIN: 12.9 g/dL — AB (ref 13.0–17.0)
MCH: 29.9 pg (ref 26.0–34.0)
MCHC: 33.9 g/dL (ref 30.0–36.0)
MCV: 88.2 fL (ref 78.0–100.0)
Platelets: 215 10*3/uL (ref 150–400)
RBC: 4.32 MIL/uL (ref 4.22–5.81)
RDW: 13.7 % (ref 11.5–15.5)
WBC: 5.4 10*3/uL (ref 4.0–10.5)

## 2015-01-25 LAB — COMPREHENSIVE METABOLIC PANEL
ALBUMIN: 4.2 g/dL (ref 3.5–5.0)
ALT: 26 U/L (ref 17–63)
ANION GAP: 9 (ref 5–15)
AST: 49 U/L — ABNORMAL HIGH (ref 15–41)
Alkaline Phosphatase: 64 U/L (ref 38–126)
BUN: 15 mg/dL (ref 6–20)
CHLORIDE: 103 mmol/L (ref 101–111)
CO2: 28 mmol/L (ref 22–32)
Calcium: 9.5 mg/dL (ref 8.9–10.3)
Creatinine, Ser: 1.21 mg/dL (ref 0.61–1.24)
GFR calc Af Amer: >60 mL/min (ref >60)
GFR calc non Af Amer: >60 mL/min (ref >60)
GLUCOSE: 89 mg/dL (ref 65–99)
Potassium: 3.8 mmol/L (ref 3.5–5.1)
SODIUM: 140 mmol/L (ref 135–145)
TOTAL PROTEIN: 7.8 g/dL (ref 6.5–8.1)
Total Bilirubin: 0.9 mg/dL (ref 0.3–1.2)

## 2015-01-25 LAB — GLUCOSE, CAPILLARY
GLUCOSE-CAPILLARY: 86 mg/dL (ref 65–99)
GLUCOSE-CAPILLARY: 85 mg/dL (ref 65–99)
Glucose-Capillary: 105 mg/dL — ABNORMAL HIGH (ref 65–99)

## 2015-01-25 LAB — TROPONIN I: Troponin I: <0.03 ng/mL (ref <0.031)

## 2015-01-25 LAB — ACETAMINOPHEN LEVEL

## 2015-01-25 LAB — MRSA PCR SCREENING: MRSA by PCR: NEGATIVE

## 2015-01-25 LAB — SALICYLATE LEVEL: Salicylate Lvl: <4 mg/dL (ref 2.8–30.0)

## 2015-01-25 LAB — RAPID URINE DRUG SCREEN, HOSP PERFORMED
Amphetamines: NOT DETECTED
BARBITURATES: NOT DETECTED
BENZODIAZEPINES: NOT DETECTED
COCAINE: POSITIVE — AB
Opiates: NOT DETECTED
Tetrahydrocannabinol: NOT DETECTED

## 2015-01-25 LAB — ETHANOL: Alcohol, Ethyl (B): <5 mg/dL (ref <5)

## 2015-01-25 MED ORDER — METFORMIN HCL 500 MG PO TABS
1000.0000 mg | ORAL_TABLET | Freq: Every day | ORAL | Status: DC
  Administered 2015-01-26: 1000 mg via ORAL
  Filled 2015-01-25 (×2): qty 2

## 2015-01-25 MED ORDER — PANTOPRAZOLE SODIUM 40 MG PO TBEC
40.0000 mg | DELAYED_RELEASE_TABLET | Freq: Every day | ORAL | Status: DC
  Administered 2015-01-25 – 2015-01-26 (×2): 40 mg via ORAL
  Filled 2015-01-25 (×2): qty 1

## 2015-01-25 MED ORDER — NITROGLYCERIN IN D5W 200-5 MCG/ML-% IV SOLN
0.0000 ug/min | INTRAVENOUS | Status: DC
  Administered 2015-01-25: 20 ug/min via INTRAVENOUS

## 2015-01-25 MED ORDER — ENOXAPARIN SODIUM 120 MG/0.8ML ~~LOC~~ SOLN: 110.0000 mg | Freq: Two times a day (BID) | SUBCUTANEOUS | Status: DC

## 2015-01-25 MED ORDER — POTASSIUM CHLORIDE CRYS ER 20 MEQ PO TBCR
40.0000 meq | EXTENDED_RELEASE_TABLET | Freq: Once | ORAL | Status: AC
  Administered 2015-01-25: 40 meq via ORAL
  Filled 2015-01-25: qty 2

## 2015-01-25 MED ORDER — BECLOMETHASONE DIPROPIONATE 40 MCG/ACT IN AERS
4.0000 | INHALATION_SPRAY | Freq: Two times a day (BID) | RESPIRATORY_TRACT | Status: DC
  Administered 2015-01-26: 4 via RESPIRATORY_TRACT
  Filled 2015-01-25: qty 8.7

## 2015-01-25 MED ORDER — ENOXAPARIN SODIUM 120 MG/0.8ML ~~LOC~~ SOLN
110.0000 mg | SUBCUTANEOUS | Status: AC
  Administered 2015-01-25: 110 mg via SUBCUTANEOUS
  Filled 2015-01-25: qty 0.8

## 2015-01-25 MED ORDER — FUROSEMIDE 40 MG PO TABS
40.0000 mg | ORAL_TABLET | Freq: Every day | ORAL | Status: DC
  Administered 2015-01-25 – 2015-01-26 (×2): 40 mg via ORAL
  Filled 2015-01-25 (×2): qty 1

## 2015-01-25 MED ORDER — FENTANYL CITRATE (PF) 100 MCG/2ML IJ SOLN: 50.0000 ug | Freq: Four times a day (QID) | INTRAMUSCULAR | Status: DC | PRN

## 2015-01-25 MED ORDER — TAMSULOSIN HCL 0.4 MG PO CAPS
0.4000 mg | ORAL_CAPSULE | Freq: Every day | ORAL | Status: DC
  Administered 2015-01-26: 0.4 mg via ORAL
  Filled 2015-01-25 (×2): qty 1

## 2015-01-25 MED ORDER — LISINOPRIL 5 MG PO TABS
5.0000 mg | ORAL_TABLET | Freq: Every day | ORAL | Status: DC
  Administered 2015-01-25 – 2015-01-26 (×2): 5 mg via ORAL
  Filled 2015-01-25 (×2): qty 1

## 2015-01-25 MED ORDER — ONDANSETRON HCL 4 MG PO TABS: 4.0000 mg | ORAL_TABLET | Freq: Three times a day (TID) | ORAL | Status: DC | PRN

## 2015-01-25 MED ORDER — METOPROLOL TARTRATE 25 MG PO TABS
25.0000 mg | ORAL_TABLET | Freq: Two times a day (BID) | ORAL | Status: DC
  Administered 2015-01-25 – 2015-01-26 (×2): 25 mg via ORAL
  Filled 2015-01-25 (×2): qty 1

## 2015-01-25 MED ORDER — SERTRALINE HCL 50 MG PO TABS
100.0000 mg | ORAL_TABLET | Freq: Every day | ORAL | Status: DC
  Administered 2015-01-25 – 2015-01-26 (×2): 100 mg via ORAL
  Filled 2015-01-25 (×2): qty 2

## 2015-01-25 MED ORDER — NITROGLYCERIN IN D5W 200-5 MCG/ML-% IV SOLN
5.0000 ug/min | INTRAVENOUS | Status: DC
  Administered 2015-01-25: 5 ug/min via INTRAVENOUS
  Filled 2015-01-25: qty 250

## 2015-01-25 MED ORDER — IPRATROPIUM BROMIDE 0.02 % IN SOLN: 500.0000 ug | Freq: Two times a day (BID) | RESPIRATORY_TRACT | Status: DC

## 2015-01-25 MED ORDER — ATORVASTATIN CALCIUM 10 MG PO TABS
10.0000 mg | ORAL_TABLET | Freq: Every day | ORAL | Status: DC
  Administered 2015-01-26: 10 mg via ORAL
  Filled 2015-01-25: qty 1

## 2015-01-25 MED ORDER — HYDROCHLOROTHIAZIDE 25 MG PO TABS
25.0000 mg | ORAL_TABLET | Freq: Every day | ORAL | Status: DC
  Administered 2015-01-25 – 2015-01-26 (×2): 25 mg via ORAL
  Filled 2015-01-25 (×2): qty 1

## 2015-01-25 MED ORDER — LEVETIRACETAM 500 MG PO TABS
500.0000 mg | ORAL_TABLET | Freq: Two times a day (BID) | ORAL | Status: DC
  Administered 2015-01-25 – 2015-01-26 (×2): 500 mg via ORAL
  Filled 2015-01-25 (×3): qty 1

## 2015-01-25 MED ORDER — LAMOTRIGINE 200 MG PO TABS
200.0000 mg | ORAL_TABLET | Freq: Every day | ORAL | Status: DC
  Administered 2015-01-25 – 2015-01-26 (×2): 200 mg via ORAL
  Filled 2015-01-25 (×2): qty 1

## 2015-01-25 MED ORDER — PREGABALIN 50 MG PO CAPS
100.0000 mg | ORAL_CAPSULE | Freq: Two times a day (BID) | ORAL | Status: DC
  Administered 2015-01-25 – 2015-01-26 (×2): 100 mg via ORAL
  Filled 2015-01-25 (×2): qty 2

## 2015-01-25 MED ORDER — NICOTINE 21 MG/24HR TD PT24: 21.0000 mg | MEDICATED_PATCH | Freq: Every day | TRANSDERMAL | Status: DC

## 2015-01-25 MED ORDER — ASPIRIN EC 325 MG PO TBEC
325.0000 mg | DELAYED_RELEASE_TABLET | Freq: Every day | ORAL | Status: DC
  Administered 2015-01-25 – 2015-01-26 (×2): 325 mg via ORAL
  Filled 2015-01-25 (×2): qty 1

## 2015-01-25 NOTE — Progress Notes (Signed)
Pt arrived from ed. [facility] admissions and Dr. Einar Gip paged. Dr. Einar Gip made aware pt was still having 5/10 cp, BP 97/68, Hr in the 80's SR. New order received to increase pt's nitro drip to 57mcg/min and IV fentanyl prn. Will cont to monitor pt.

## 2015-01-25 NOTE — ED Notes (Signed)
Pt c/o suicidal ideations and request to detox from cocaine. He reports he was discharged from [facility] this a.m. For cardiology due to using cocaine. He reports  He hasn't used cocaine in "a while".  He reports he  has been really stressed over multiple things  (losing house and health).  He denies any certain plan to harm self.  He denies wanting to harm self.

## 2015-01-25 NOTE — ED Notes (Signed)
Patient arrived to room; no s/s of distress noted on assessment. Pt denies SI or plans to harm himself at this time. Pt given sandwich and drink, and blankets as well. No complaints voiced.

## 2015-01-25 NOTE — ED Provider Notes (Signed)
CSN: [id number]     Arrival date & time 01/25/15  1923 History   First MD Initiated Contact with Patient 01/25/15 2101     Chief Complaint  Patient presents with  . Medical Clearance     (Consider location/radiation/quality/duration/timing/severity/associated sxs/prior Treatment) HPI Comments: Patient here complaining of suicidal ideations which began yesterday. Was just discharged from hospital today after an admission for cocaine-induced chest pain. After he went home, he became more suicidal. He does have a history of suicide attempt in the past. States that he would ingest medications as his method to harm himself. Denies any command hallucinations. No auditory or visual halluicinations. Denies any current chest pain. Symptoms persistent and nothing makes them better. No treatment for this use prior to arrival  The history is provided by the patient.    Past Medical History  Diagnosis Date  . Hypertension   . High cholesterol   . Congestive heart failure (CHF) ([place])   . Arthritis   . MI (myocardial infarction) ([place])     7, last one was in 2011  . PUD (peptic ulcer disease)     in 1990s, secondary to medication  . Laceration of right hand 11/27/10  . Chronic back pain     Pain Clinic in [place]  . Chronic bronchitis   . CHF (congestive heart failure) ([place])   . Bronchitis, chronic ([place])   . Anemia   . GERD (gastroesophageal reflux disease)   . Frequency of urination   . Seizures ([place])     entire life, last seizure in 2011;unknown etiology-pt sts heriditary  . Tonsillitis, chronic     Dr. Vicki Mallet in [place]  . Shortness of breath     with exertion  . Depression   . Headache(784.0)   . Laceration of wrist 2007 BIL FOREARMS  . Coronary artery disease    Past Surgical History  Procedure Laterality Date  . Carpal tunnel release      bilateral  . Knee surgery      left-plate to left knee cap from accident  . Coronary artery bypass graft  2002    3 vessels  .  Back surgery      3-  . Savory dilation  12/28/2010    SLF:(MAC)J-shaped stomach/nodular mocosa in the distal esophagus/empiric dilation 63mm  . Colonoscopy  12/28/10    SLF: (MAC)Internal hemorrhoids/four small colon polyps  . Esophagogastroduodenoscopy N/A 05/30/2012    SLF: UNCONTROLLED GERD DUE TO LIFESTYLE CHOICE/WEIGHT GAIN/MILD Non-erosive gastritis  . Esophageal biopsy N/A 05/30/2012    Procedure: BIOPSY;  Surgeon: Danie Binder, MD;  Location: [facility];  Service: Endoscopy;  Laterality: N/A;  . Left heart catheterization with coronary angiogram N/A 08/31/2011    Procedure: LEFT HEART CATHETERIZATION WITH CORONARY ANGIOGRAM;  Surgeon: Laverda Page, MD;  Location: [facility] CATH LAB;  Service: Cardiovascular;  Laterality: N/A;   Family History  Problem Relation Age of Onset  . Diabetes Mother   . Hypertension Mother   . Hypertension Father   . Diabetes Father   . Heart attack      mother, father, brother, sister all deceased due to MI  . Colon cancer Neg Hx   . Liver disease Neg Hx   . Anesthesia problems Neg Hx   . Hypotension Neg Hx   . Malignant hyperthermia Neg Hx   . Pseudochol deficiency Neg Hx   . Colon polyps Neg Hx    Social History  Substance Use Topics  . Smoking status:  Never Smoker   . Smokeless tobacco: None     Comment: Never Smoked  . Alcohol Use: No    Review of Systems  All other systems reviewed and are negative.     Allergies  Gabapentin; Ibuprofen; Naproxen; and Zolpidem tartrate  Home Medications   Prior to Admission medications   Medication Sig Start Date End Date Taking? Authorizing Provider  albuterol (PROAIR HFA) 108 (90 BASE) MCG/ACT inhaler Inhale 2 puffs into the lungs every 6 (six) hours as needed for wheezing (for shortness of breath).   Yes Historical Provider, MD  albuterol (PROVENTIL) (2.5 MG/3ML) 0.083% nebulizer solution Take 2.5 mg by nebulization 2 (two) times daily.   Yes Historical Provider, MD  alprazolam Duanne Moron) 2 MG tablet  Take 2 mg by mouth Twice daily. 03/15/11  Yes Historical Provider, MD  aspirin 325 MG EC tablet Take 325 mg by mouth daily.     Yes Historical Provider, MD  atorvastatin (LIPITOR) 10 MG tablet Take 10 mg by mouth every morning.    Yes Historical Provider, MD  beclomethasone (QVAR) 80 MCG/ACT inhaler Inhale 2 puffs into the lungs 2 (two) times daily.   Yes Historical Provider, MD  cetirizine (ZYRTEC) 10 MG tablet Take 10 mg by mouth daily as needed for allergies or rhinitis.    Yes Historical Provider, MD  esomeprazole (NEXIUM) 40 MG capsule Take 1 capsule (40 mg total) by mouth 2 (two) times daily. 10/01/14  Yes Carlis Stable, NP  furosemide (LASIX) 40 MG tablet Take 40 mg by mouth daily.     Yes Historical Provider, MD  hydrochlorothiazide (HYDRODIURIL) 25 MG tablet Take 25 mg by mouth daily.   Yes Historical Provider, MD  ipratropium (ATROVENT) 0.02 % nebulizer solution Take 500 mcg by nebulization 2 (two) times daily.   Yes Historical Provider, MD  lamoTRIgine (LAMICTAL) 200 MG tablet Take 200 mg by mouth daily.   Yes Historical Provider, MD  levETIRAcetam (KEPPRA) 500 MG tablet Take 500 mg by mouth every 12 (twelve) hours.     Yes Historical Provider, MD  lisinopril (PRINIVIL,ZESTRIL) 10 MG tablet Take 5 mg by mouth daily. Take half of a tablet   Yes Historical Provider, MD  metFORMIN (GLUCOPHAGE) 1000 MG tablet Take 1,000 mg by mouth daily with breakfast.   Yes Historical Provider, MD  metoprolol tartrate (LOPRESSOR) 25 MG tablet Take 25 mg by mouth 2 (two) times daily.     Yes Historical Provider, MD  oxyCODONE-acetaminophen (PERCOCET) 10-325 MG per tablet Take 1 tablet by mouth 4 (four) times daily as needed. pain   Yes Historical Provider, MD  pregabalin (LYRICA) 100 MG capsule Take 100 mg by mouth 2 (two) times daily.   Yes Historical Provider, MD  sertraline (ZOLOFT) 100 MG tablet Take 100 mg by mouth Every morning. 03/15/11  Yes Historical Provider, MD  Tamsulosin HCl (FLOMAX) 0.4 MG CAPS Take  0.4 mg by mouth daily after breakfast.    Yes Historical Provider, MD   BP 113/85 mmHg  Pulse 77  Temp(Src) 98.4 F (36.9 C) (Oral)  Resp 16  SpO2 96% Physical Exam  Constitutional: He is oriented to person, place, and time. He appears well-developed and well-nourished.  Non-toxic appearance. No distress.  HENT:  Head: Normocephalic and atraumatic.  Eyes: Conjunctivae, EOM and lids are normal. Pupils are equal, round, and reactive to light.  Neck: Normal range of motion. Neck supple. No tracheal deviation present. No thyroid mass present.  Cardiovascular: Normal rate, regular rhythm and normal  heart sounds.  Exam reveals no gallop.   No murmur heard. Pulmonary/Chest: Effort normal and breath sounds normal. No stridor. No respiratory distress. He has no decreased breath sounds. He has no wheezes. He has no rhonchi. He has no rales.  Abdominal: Soft. Normal appearance and bowel sounds are normal. He exhibits no distension. There is no tenderness. There is no rebound and no CVA tenderness.  Musculoskeletal: Normal range of motion. He exhibits no edema or tenderness.  Neurological: He is alert and oriented to person, place, and time. He has normal strength. No cranial nerve deficit or sensory deficit. GCS eye subscore is 4. GCS verbal subscore is 5. GCS motor subscore is 6.  Skin: Skin is warm and dry. No abrasion and no rash noted.  Psychiatric: His affect is blunt. His speech is delayed. He is slowed.  Nursing note and vitals reviewed.   ED Course  Procedures (including critical care time) Labs Review Labs Reviewed  COMPREHENSIVE METABOLIC PANEL - Abnormal; Notable for the following:    AST 49 (*)    All other components within normal limits  CBC - Abnormal; Notable for the following:    Hemoglobin 12.9 (*)    HCT 38.1 (*)    All other components within normal limits  URINE RAPID DRUG SCREEN, HOSP PERFORMED - Abnormal; Notable for the following:    Cocaine POSITIVE (*)    All  other components within normal limits  ETHANOL  SALICYLATE LEVEL  ACETAMINOPHEN LEVEL    Imaging Review Dg Chest Port 1 View  01/24/2015  CLINICAL DATA:  Left chest pain radiating into the left arm for 1 week. EXAM: PORTABLE CHEST 1 VIEW COMPARISON:  09/07/2012 FINDINGS: A single AP portable view of the chest demonstrates no focal airspace consolidation or alveolar edema. The lungs are grossly clear. There is no large effusion or pneumothorax. Cardiac and mediastinal contours appear unremarkable. IMPRESSION: No active disease. Electronically Signed   By: Andreas Newport M.D.   On: 01/24/2015 23:19   I have personally reviewed and evaluated these images and lab results as part of my medical decision-making.   EKG Interpretation None      MDM   Final diagnoses:  None    Patient will be medically cleared and then evaluated by the psychiatry service    Lacretia Leigh, MD 01/25/15 2132

## 2015-01-25 NOTE — Discharge Summary (Signed)
Please see admission note for discharge summary, patient admitted with pleuritic chest pain after cocaine use. Patient will follow up with his PCP.    Medication List    TAKE these medications        alprazolam 2 MG tablet  Commonly known as:  XANAX  Take 2 mg by mouth Twice daily.     aspirin 325 MG EC tablet  Take 325 mg by mouth daily.     atorvastatin 10 MG tablet  Commonly known as:  LIPITOR  Take 10 mg by mouth every morning.     beclomethasone 80 MCG/ACT inhaler  Commonly known as:  QVAR  Inhale 2 puffs into the lungs 2 (two) times daily.     cetirizine 10 MG tablet  Commonly known as:  ZYRTEC  Take 10 mg by mouth daily as needed for allergies or rhinitis.     esomeprazole 40 MG capsule  Commonly known as:  NEXIUM  Take 1 capsule (40 mg total) by mouth 2 (two) times daily.     furosemide 40 MG tablet  Commonly known as:  LASIX  Take 40 mg by mouth daily.     hydrochlorothiazide 25 MG tablet  Commonly known as:  HYDRODIURIL  Take 25 mg by mouth daily.     ipratropium 0.02 % nebulizer solution  Commonly known as:  ATROVENT  Take 500 mcg by nebulization 2 (two) times daily.     lamoTRIgine 200 MG tablet  Commonly known as:  LAMICTAL  Take 200 mg by mouth daily.     levETIRAcetam 500 MG tablet  Commonly known as:  KEPPRA  Take 500 mg by mouth every 12 (twelve) hours.     lisinopril 10 MG tablet  Commonly known as:  PRINIVIL,ZESTRIL  Take 5 mg by mouth daily. Take half of a tablet     metFORMIN 1000 MG tablet  Commonly known as:  GLUCOPHAGE  Take 1,000 mg by mouth daily with breakfast.     metoprolol tartrate 25 MG tablet  Commonly known as:  LOPRESSOR  Take 25 mg by mouth 2 (two) times daily.     oxyCODONE-acetaminophen 10-325 MG tablet  Commonly known as:  PERCOCET  Take 1 tablet by mouth 4 (four) times daily as needed. pain     potassium chloride 20 MEQ packet  Commonly known as:  KLOR-CON  Take 20 mEq by mouth 2 (two) times daily.     pregabalin 100 MG capsule  Commonly known as:  LYRICA  Take 100 mg by mouth 2 (two) times daily.     PROAIR HFA 108 (90 BASE) MCG/ACT inhaler  Generic drug:  albuterol  Inhale 2 puffs into the lungs every 6 (six) hours as needed for wheezing (for shortness of breath).     albuterol (2.5 MG/3ML) 0.083% nebulizer solution  Commonly known as:  PROVENTIL  Take 2.5 mg by nebulization 2 (two) times daily.     sertraline 100 MG tablet  Commonly known as:  ZOLOFT  Take 100 mg by mouth Every morning.     tamsulosin 0.4 MG Caps capsule  Commonly known as:  FLOMAX  Take 0.4 mg by mouth daily after breakfast.

## 2015-01-25 NOTE — ED Notes (Signed)
Pt wanded by security and belongings searched by same.

## 2015-01-25 NOTE — Progress Notes (Signed)
Pt d/c home in no acute distress.  States family member will pick him up.  Advised to f/u with PCP. D/C paperwork reviewed.  Pt in agreement.

## 2015-01-25 NOTE — H&P (Signed)
Dalton [name] is an 54 y.o. male.   Chief Complaint: Chest pain and palpitations HPI: Dalton [name]  is a 54 y.o. male  With study of polysubstance abuse in the past, last cocaine use one year ago, again used cocaine 2 days ago, stayed up all night and all day yesterday, in the evening started to have sharp chest pain and palpitations and presented to the emergency room. He has history of CABG with LIMA to LAD, but coronary angiography in 2013 had revealed occluded LIMA to LAD, native LAD had a 30% stenosis. He has a left subclavian artery stent that was widely patent.  This morning he feels well, has no recurrence of chest pain. Still feels tired and fatigued. He complains of back pain that is chronic. Denies any symptoms of neurologic deficits. No nausea or vomiting. No headache. I had not seen him since 2014.  Past Medical History  Diagnosis Date  . Hypertension   . High cholesterol   . Congestive heart failure (CHF) ([place])   . Arthritis   . MI (myocardial infarction) ([place])     7, last one was in 2011  . PUD (peptic ulcer disease)     in 1990s, secondary to medication  . Laceration of right hand 11/27/10  . Chronic back pain     Pain Clinic in [place]  . Chronic bronchitis   . CHF (congestive heart failure) ([place])   . Bronchitis, chronic ([place])   . Anemia   . GERD (gastroesophageal reflux disease)   . Frequency of urination   . Seizures ([place])     entire life, last seizure in 2011;unknown etiology-pt sts heriditary  . Tonsillitis, chronic     Dr. Vicki Mallet in [place]  . Shortness of breath     with exertion  . Depression   . Headache(784.0)   . Laceration of wrist 2007 BIL FOREARMS  . Coronary artery disease     Past Surgical History  Procedure Laterality Date  . Carpal tunnel release      bilateral  . Knee surgery      left-plate to left knee cap from accident  . Coronary artery bypass graft  2002    3 vessels  . Back surgery      3-  . Savory dilation   12/28/2010    SLF:(MAC)J-shaped stomach/nodular mocosa in the distal esophagus/empiric dilation 33mm  . Colonoscopy  12/28/10    SLF: (MAC)Internal hemorrhoids/four small colon polyps  . Esophagogastroduodenoscopy N/A 05/30/2012    SLF: UNCONTROLLED GERD DUE TO LIFESTYLE CHOICE/WEIGHT GAIN/MILD Non-erosive gastritis  . Esophageal biopsy N/A 05/30/2012    Procedure: BIOPSY;  Surgeon: Danie Binder, MD;  Location: [facility];  Service: Endoscopy;  Laterality: N/A;  . Left heart catheterization with coronary angiogram N/A 08/31/2011    Procedure: LEFT HEART CATHETERIZATION WITH CORONARY ANGIOGRAM;  Surgeon: Laverda Page, MD;  Location: [facility] CATH LAB;  Service: Cardiovascular;  Laterality: N/A;    Family History  Problem Relation Age of Onset  . Diabetes Mother   . Hypertension Mother   . Hypertension Father   . Diabetes Father   . Heart attack      mother, father, brother, sister all deceased due to MI  . Colon cancer Neg Hx   . Liver disease Neg Hx   . Anesthesia problems Neg Hx   . Hypotension Neg Hx   . Malignant hyperthermia Neg Hx   . Pseudochol deficiency Neg Hx   . Colon polyps  Neg Hx    Social History:  reports that he has never smoked. He does not have any smokeless tobacco history on file. He reports that he does not drink alcohol or use illicit drugs.  Allergies:  Allergies  Allergen Reactions  . Gabapentin Hives  . Ibuprofen Other (See Comments)    REACTION:Stomach  upset  . Naproxen     REACTION: unknown  . Zolpidem Tartrate     REACTION: Hallucinations     Review of Systems - Negative except Chronic back pain, mild dyspnea on exertion that is chronic.  Blood pressure 104/67, pulse 69, temperature 98.4 F (36.9 C), temperature source Oral, resp. rate 17, height 5\' 10"  (1.778 m), weight 110.088 kg (242 lb 11.2 oz), SpO2 97 %. General appearance: alert, cooperative, appears stated age, no distress and mildly obese Eyes: negative findings: lids and lashes normal Neck:  no adenopathy, no carotid bruit, no JVD, supple, symmetrical, trachea midline and thyroid not enlarged, symmetric, no tenderness/mass/nodules Neck: JVP - normal, carotids 2+= without bruits Resp: clear to auscultation bilaterally Chest wall: no tenderness Cardio: regular rate and rhythm, S1, S2 normal, no murmur, click, rub or gallop GI: soft, non-tender; bowel sounds normal; no masses,  no organomegaly and Obese. Extremities: extremities normal, atraumatic, no cyanosis or edema Pulses: 2+ and symmetric Skin: Skin color, texture, turgor normal. No rashes or lesions Neurologic: Grossly normal  Results for orders placed or performed during the hospital encounter of 01/24/15 (from the past 48 hour(s))  CBC with Differential/Platelet     Status: Abnormal   Collection Time: 01/24/15 10:46 PM  Result Value Ref Range   WBC 7.5 4.0 - 10.5 K/uL   RBC 4.32 4.22 - 5.81 MIL/uL   Hemoglobin 12.8 (L) 13.0 - 17.0 g/dL   HCT 37.4 (L) 39.0 - 52.0 %   MCV 86.6 78.0 - 100.0 fL   MCH 29.6 26.0 - 34.0 pg   MCHC 34.2 30.0 - 36.0 g/dL   RDW 13.9 11.5 - 15.5 %   Platelets 192 150 - 400 K/uL   Neutrophils Relative % 64 %   Neutro Abs 4.9 1.7 - 7.7 K/uL   Lymphocytes Relative 30 %   Lymphs Abs 2.2 0.7 - 4.0 K/uL   Monocytes Relative 5 %   Monocytes Absolute 0.3 0.1 - 1.0 K/uL   Eosinophils Relative 1 %   Eosinophils Absolute 0.1 0.0 - 0.7 K/uL   Basophils Relative 0 %   Basophils Absolute 0.0 0.0 - 0.1 K/uL  Protime-INR     Status: None   Collection Time: 01/24/15 10:46 PM  Result Value Ref Range   Prothrombin Time 14.8 11.6 - 15.2 seconds   INR 1.14 0.00 - 1.49  I-stat troponin, ED     Status: None   Collection Time: 01/24/15 10:51 PM  Result Value Ref Range   Troponin i, poc 0.00 0.00 - 0.08 ng/mL   Comment 3            Comment: Due to the release kinetics of cTnI, a negative result within the first hours of the onset of symptoms does not rule out myocardial infarction with certainty. If  myocardial infarction is still suspected, repeat the test at appropriate intervals.   I-stat chem 8, ed     Status: Abnormal   Collection Time: 01/24/15 10:52 PM  Result Value Ref Range   Sodium 144 135 - 145 mmol/L   Potassium 3.3 (L) 3.5 - 5.1 mmol/L   Chloride 102 101 - 111  mmol/L   BUN 15 6 - 20 mg/dL   Creatinine, Ser 1.30 (H) 0.61 - 1.24 mg/dL   Glucose, Bld 89 65 - 99 mg/dL   Calcium, Ion 1.17 1.12 - 1.23 mmol/L   TCO2 27 0 - 100 mmol/L   Hemoglobin 13.6 13.0 - 17.0 g/dL   HCT 40.0 39.0 - 52.0 %  Glucose, capillary     Status: Abnormal   Collection Time: 01/25/15  1:54 AM  Result Value Ref Range   Glucose-Capillary 105 (H) 65 - 99 mg/dL  MRSA PCR Screening     Status: None   Collection Time: 01/25/15  1:59 AM  Result Value Ref Range   MRSA by PCR NEGATIVE NEGATIVE    Comment:        The GeneXpert MRSA Assay (FDA approved for NASAL specimens only), is one component of a comprehensive MRSA colonization surveillance program. It is not intended to diagnose MRSA infection nor to guide or monitor treatment for MRSA infections.   Troponin I     Status: None   Collection Time: 01/25/15  3:03 AM  Result Value Ref Range   Troponin I <0.03 <0.031 ng/mL    Comment:        NO INDICATION OF MYOCARDIAL INJURY.   Troponin I     Status: None   Collection Time: 01/25/15  8:08 AM  Result Value Ref Range   Troponin I <0.03 <0.031 ng/mL    Comment:        NO INDICATION OF MYOCARDIAL INJURY.   Glucose, capillary     Status: None   Collection Time: 01/25/15  8:09 AM  Result Value Ref Range   Glucose-Capillary 86 65 - 99 mg/dL   Dg Chest Port 1 View  01/24/2015  CLINICAL DATA:  Left chest pain radiating into the left arm for 1 week. EXAM: PORTABLE CHEST 1 VIEW COMPARISON:  09/07/2012 FINDINGS: A single AP portable view of the chest demonstrates no focal airspace consolidation or alveolar edema. The lungs are grossly clear. There is no large effusion or pneumothorax. Cardiac  and mediastinal contours appear unremarkable. IMPRESSION: No active disease. Electronically Signed   By: Andreas Newport M.D.   On: 01/24/2015 23:19    Labs:   Lab Results  Component Value Date   WBC 7.5 01/24/2015   HGB 13.6 01/24/2015   HCT 40.0 01/24/2015   MCV 86.6 01/24/2015   PLT 192 01/24/2015    Recent Labs Lab 01/24/15 2252  NA 144  K 3.3*  CL 102  BUN 15  CREATININE 1.30*  GLUCOSE 89   HEMOGLOBIN A1C Lab Results  Component Value Date   HGBA1C  10/24/2008    5.9 (NOTE) The ADA recommends the following therapeutic goal for glycemic control related to Hgb A1c measurement: Goal of therapy: <6.5 Hgb A1c  Reference: American Diabetes Association: Clinical Practice Recommendations 2010, Diabetes Care, 2010, 33: (Suppl  1).   MPG 123 10/24/2008    Cardiac Panel (last 3 results)  Recent Labs  01/25/15 0303 01/25/15 0808  TROPONINI <0.03 <0.03    Lab Results  Component Value Date   CKTOTAL 457* 04/17/2010   CKMB 4.4* 04/17/2010   TROPONINI <0.03 01/25/2015     TSH No results for input(s): TSH in the last 8760 hours.  EKG: 01/24/2015: Atrial tachycardia, nonspecific T abnormality. Telemetry 01/25/2015: Normal sinus rhythm. EKG was/04/2014: Normal sinus rhythm without evidence of ischemia.  Medications Prior to Admission  Medication Sig Dispense Refill  . albuterol (PROAIR HFA)  108 (90 BASE) MCG/ACT inhaler Inhale 2 puffs into the lungs every 6 (six) hours as needed for wheezing (for shortness of breath).    [name] Kitchen albuterol (PROVENTIL) (2.5 MG/3ML) 0.083% nebulizer solution Take 2.5 mg by nebulization 2 (two) times daily.    [name] Kitchen alprazolam (XANAX) 2 MG tablet Take 2 mg by mouth Twice daily.    [name] Kitchen aspirin 325 MG EC tablet Take 325 mg by mouth daily.      [name] Kitchen atorvastatin (LIPITOR) 10 MG tablet Take 10 mg by mouth every morning.     . beclomethasone (QVAR) 80 MCG/ACT inhaler Inhale 2 puffs into the lungs 2 (two) times daily.    . cetirizine (ZYRTEC) 10 MG tablet Take  10 mg by mouth daily as needed for allergies or rhinitis.     [name] Kitchen esomeprazole (NEXIUM) 40 MG capsule Take 1 capsule (40 mg total) by mouth 2 (two) times daily. 62 capsule 5  . furosemide (LASIX) 40 MG tablet Take 40 mg by mouth daily.      . hydrochlorothiazide (HYDRODIURIL) 25 MG tablet Take 25 mg by mouth daily.    [name] Kitchen ipratropium (ATROVENT) 0.02 % nebulizer solution Take 500 mcg by nebulization 2 (two) times daily.    [name] Kitchen lamoTRIgine (LAMICTAL) 200 MG tablet Take 200 mg by mouth daily.    [name] Kitchen levETIRAcetam (KEPPRA) 500 MG tablet Take 500 mg by mouth every 12 (twelve) hours.      [name] Kitchen lisinopril (PRINIVIL,ZESTRIL) 10 MG tablet Take 5 mg by mouth daily. Take half of a tablet    . metFORMIN (GLUCOPHAGE) 1000 MG tablet Take 1,000 mg by mouth daily with breakfast.    . metoprolol tartrate (LOPRESSOR) 25 MG tablet Take 25 mg by mouth 2 (two) times daily.      [name] Kitchen oxyCODONE-acetaminophen (PERCOCET) 10-325 MG per tablet Take 1 tablet by mouth 4 (four) times daily as needed. pain    . potassium chloride (KLOR-CON) 20 MEQ packet Take 20 mEq by mouth 2 (two) times daily. 8 packet 0  . pregabalin (LYRICA) 100 MG capsule Take 100 mg by mouth 2 (two) times daily.    . sertraline (ZOLOFT) 100 MG tablet Take 100 mg by mouth Every morning.    . Tamsulosin HCl (FLOMAX) 0.4 MG CAPS Take 0.4 mg by mouth daily after breakfast.     . potassium chloride (K-DUR) 10 MEQ tablet Take 2 tablets (20 mEq total) by mouth daily. (Patient not taking: Reported on 10/01/2014) 5 tablet 0     Current facility-administered medications:  .  enoxaparin (LOVENOX) injection 110 mg, 110 mg, Subcutaneous, Q12H, Franky Macho, RPH .  fentaNYL (SUBLIMAZE) injection 50 mcg, 50 mcg, Intravenous, QID PRN, Adrian Prows, MD  Assessment/Plan 1. Musculoskeletal chest pain following cocaine use, cardiac markers negative for myocardial injury 2. Palpitations, episode of atrial tachycardia, probably induced by cocaine. 3. History of CABG, no significant  coronary artery disease by coronary angiography in 2013, occluded LIMA to LAD, LAD has a 30% proximal stenosis. Left subclavian artery stent. 4. Hypertension 5. Hyperlipidemia 6. Diabetes mellitus type 2  Recommendation: Patient can be discharged home today with outpatient follow-up with his PCP. I have had a long discussion with the patient regarding complete abstinence from cocaine use. From cardiac standpoint he remains stable.  Adrian Prows, MD 01/25/2015, 10:17 AM [facility]. [place] Pager: [phone] Office: [phone] If no answer: Cell:  [phone]

## 2015-01-25 NOTE — BH Assessment (Signed)
[facility] at capacity. Faxed clinical information to the following facilities for placement:  [facility], [facility], [facility], [facility] Triage Specialist [phone]

## 2015-01-25 NOTE — Progress Notes (Signed)
ANTICOAGULATION CONSULT NOTE - Initial Consult  Pharmacy Consult for Lovenox Indication: chest pain/ACS  Allergies  Allergen Reactions  . Gabapentin Hives  . Ibuprofen Other (See Comments)    REACTION:Stomach  upset  . Naproxen     REACTION: unknown  . Zolpidem Tartrate     REACTION: Hallucinations     Patient Measurements:    Ht: 70 in   Wt: ~110 kg  Vital Signs: Temp: 97.7 F (36.5 C) (12/02 2247) Temp Source: Oral (12/02 2247) BP: 92/69 mmHg (12/03 0045) Pulse Rate: 193 (12/03 0045)  Labs:  Recent Labs  01/24/15 2246 01/24/15 2252  HGB 12.8* 13.6  HCT 37.4* 40.0  PLT 192  --   LABPROT 14.8  --   INR 1.14  --   CREATININE  --  1.30*    CrCl cannot be calculated (Unknown ideal weight.).   Medical History: Past Medical History  Diagnosis Date  . Hypertension   . High cholesterol   . Congestive heart failure (CHF) ([place])   . Arthritis   . MI (myocardial infarction) ([place])     7, last one was in 2011  . PUD (peptic ulcer disease)     in 1990s, secondary to medication  . Laceration of right hand 11/27/10  . Chronic back pain     Pain Clinic in [place]  . Chronic bronchitis   . CHF (congestive heart failure) ([place])   . Bronchitis, chronic ([place])   . Anemia   . GERD (gastroesophageal reflux disease)   . Frequency of urination   . Seizures ([place])     entire life, last seizure in 2011;unknown etiology-pt sts heriditary  . Tonsillitis, chronic     Dr. Vicki Mallet in [place]  . Shortness of breath     with exertion  . Depression   . Headache(784.0)   . Laceration of wrist 2007 BIL FOREARMS  . Coronary artery disease     Medications:  See electronic med rec  Assessment: 54 y.o. M presents with CP. To begin Lovenox for r/o ACS. CBC stable on admission. SCr 1.3, est CrCl 68 ml/min.  Goal of Therapy:  Anti-Xa level 0.6-1 units/ml 4hrs after LMWH dose given Monitor platelets by anticoagulation protocol: Yes   Plan:  Lovenox 110mg  SQ now then  every q12h CBC q72h while on Lovenox  Sherlon Handing, PharmD, BCPS Clinical pharmacist, pager [phone] 01/25/2015,1:01 AM
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
## 2015-01-25 NOTE — ED Notes (Signed)
Pt wanded by security. 

## 2015-01-26 ENCOUNTER — Encounter (HOSPITAL_COMMUNITY): Payer: Self-pay | Admitting: *Deleted

## 2015-01-26 ENCOUNTER — Observation Stay (HOSPITAL_COMMUNITY)
Admission: AD | Admit: 2015-01-26 | Discharge: 2015-01-27 | Disposition: A | Discharge status: Home / Self Care | Payer: Medicare Other | Source: Intra-hospital | Attending: Psychiatry | Admitting: Psychiatry

## 2015-01-26 DIAGNOSIS — E7800 Pure hypercholesterolemia, unspecified: Secondary | ICD-10-CM | POA: Diagnosis not present

## 2015-01-26 DIAGNOSIS — I252 Old myocardial infarction: Secondary | ICD-10-CM | POA: Insufficient documentation

## 2015-01-26 DIAGNOSIS — F1414 Cocaine abuse with cocaine-induced mood disorder: Principal | ICD-10-CM | POA: Insufficient documentation

## 2015-01-26 DIAGNOSIS — F1410 Cocaine abuse, uncomplicated: Secondary | ICD-10-CM | POA: Diagnosis present

## 2015-01-26 DIAGNOSIS — F329 Major depressive disorder, single episode, unspecified: Secondary | ICD-10-CM | POA: Insufficient documentation

## 2015-01-26 DIAGNOSIS — Z951 Presence of aortocoronary bypass graft: Secondary | ICD-10-CM | POA: Diagnosis not present

## 2015-01-26 DIAGNOSIS — Z888 Allergy status to other drugs, medicaments and biological substances status: Secondary | ICD-10-CM | POA: Insufficient documentation

## 2015-01-26 DIAGNOSIS — M549 Dorsalgia, unspecified: Secondary | ICD-10-CM | POA: Insufficient documentation

## 2015-01-26 DIAGNOSIS — I739 Peripheral vascular disease, unspecified: Secondary | ICD-10-CM | POA: Insufficient documentation

## 2015-01-26 DIAGNOSIS — K219 Gastro-esophageal reflux disease without esophagitis: Secondary | ICD-10-CM | POA: Diagnosis not present

## 2015-01-26 DIAGNOSIS — E119 Type 2 diabetes mellitus without complications: Secondary | ICD-10-CM | POA: Diagnosis not present

## 2015-01-26 DIAGNOSIS — J42 Unspecified chronic bronchitis: Secondary | ICD-10-CM | POA: Insufficient documentation

## 2015-01-26 DIAGNOSIS — F1494 Cocaine use, unspecified with cocaine-induced mood disorder: Secondary | ICD-10-CM | POA: Diagnosis present

## 2015-01-26 DIAGNOSIS — G8929 Other chronic pain: Secondary | ICD-10-CM | POA: Diagnosis not present

## 2015-01-26 DIAGNOSIS — I5020 Unspecified systolic (congestive) heart failure: Secondary | ICD-10-CM | POA: Diagnosis not present

## 2015-01-26 DIAGNOSIS — I2510 Atherosclerotic heart disease of native coronary artery without angina pectoris: Secondary | ICD-10-CM | POA: Insufficient documentation

## 2015-01-26 DIAGNOSIS — Z79899 Other long term (current) drug therapy: Secondary | ICD-10-CM | POA: Diagnosis not present

## 2015-01-26 DIAGNOSIS — R45851 Suicidal ideations: Secondary | ICD-10-CM | POA: Insufficient documentation

## 2015-01-26 DIAGNOSIS — I110 Hypertensive heart disease with heart failure: Secondary | ICD-10-CM | POA: Insufficient documentation

## 2015-01-26 DIAGNOSIS — Z8711 Personal history of peptic ulcer disease: Secondary | ICD-10-CM | POA: Diagnosis not present

## 2015-01-26 DIAGNOSIS — Z886 Allergy status to analgesic agent status: Secondary | ICD-10-CM | POA: Insufficient documentation

## 2015-01-26 MED ORDER — METOPROLOL TARTRATE 25 MG PO TABS
25.0000 mg | ORAL_TABLET | Freq: Two times a day (BID) | ORAL | Status: DC
  Administered 2015-01-26: 25 mg via ORAL
  Filled 2015-01-26 (×2): qty 1

## 2015-01-26 MED ORDER — METFORMIN HCL 500 MG PO TABS
1000.0000 mg | ORAL_TABLET | Freq: Every day | ORAL | Status: DC
  Filled 2015-01-26: qty 2

## 2015-01-26 MED ORDER — LISINOPRIL 5 MG PO TABS
5.0000 mg | ORAL_TABLET | Freq: Every day | ORAL | Status: DC
  Filled 2015-01-26: qty 1

## 2015-01-26 MED ORDER — HYDROCHLOROTHIAZIDE 25 MG PO TABS
25.0000 mg | ORAL_TABLET | Freq: Every day | ORAL | Status: DC
  Filled 2015-01-26: qty 1

## 2015-01-26 MED ORDER — ATORVASTATIN CALCIUM 10 MG PO TABS: 10.0000 mg | ORAL_TABLET | Freq: Every day | ORAL | Status: DC

## 2015-01-26 MED ORDER — FUROSEMIDE 20 MG PO TABS
40.0000 mg | ORAL_TABLET | Freq: Every day | ORAL | Status: DC
  Filled 2015-01-26: qty 2

## 2015-01-26 MED ORDER — SERTRALINE HCL 100 MG PO TABS
100.0000 mg | ORAL_TABLET | Freq: Every day | ORAL | Status: DC
  Filled 2015-01-26: qty 1

## 2015-01-26 MED ORDER — ALUM & MAG HYDROXIDE-SIMETH 200-200-20 MG/5ML PO SUSP: 30.0000 mL | ORAL | Status: DC | PRN

## 2015-01-26 MED ORDER — IPRATROPIUM BROMIDE 0.02 % IN SOLN: 500.0000 ug | Freq: Two times a day (BID) | RESPIRATORY_TRACT | Status: DC

## 2015-01-26 MED ORDER — LEVETIRACETAM 500 MG PO TABS
500.0000 mg | ORAL_TABLET | Freq: Two times a day (BID) | ORAL | Status: DC
  Administered 2015-01-26: 500 mg via ORAL
  Filled 2015-01-26 (×2): qty 1

## 2015-01-26 MED ORDER — ACETAMINOPHEN 325 MG PO TABS
650.0000 mg | ORAL_TABLET | Freq: Four times a day (QID) | ORAL | Status: DC | PRN
  Administered 2015-01-26: 650 mg via ORAL
  Filled 2015-01-26: qty 2

## 2015-01-26 MED ORDER — LAMOTRIGINE 100 MG PO TABS
200.0000 mg | ORAL_TABLET | Freq: Every day | ORAL | Status: DC
  Filled 2015-01-26: qty 2

## 2015-01-26 MED ORDER — MAGNESIUM HYDROXIDE 400 MG/5ML PO SUSP: 30.0000 mL | Freq: Every day | ORAL | Status: DC | PRN

## 2015-01-26 MED ORDER — BECLOMETHASONE DIPROPIONATE 40 MCG/ACT IN AERS
4.0000 | INHALATION_SPRAY | Freq: Two times a day (BID) | RESPIRATORY_TRACT | Status: DC
  Filled 2015-01-26: qty 8.7

## 2015-01-26 MED ORDER — TAMSULOSIN HCL 0.4 MG PO CAPS
0.4000 mg | ORAL_CAPSULE | Freq: Every day | ORAL | Status: DC
  Filled 2015-01-26 (×3): qty 1

## 2015-01-26 MED ORDER — IPRATROPIUM BROMIDE 0.02 % IN SOLN
0.5000 mg | Freq: Two times a day (BID) | RESPIRATORY_TRACT | Status: DC
  Administered 2015-01-26: 0.5 mg via RESPIRATORY_TRACT
  Filled 2015-01-26 (×2): qty 2.5

## 2015-01-26 MED ORDER — ONDANSETRON HCL 4 MG PO TABS: 4.0000 mg | ORAL_TABLET | Freq: Three times a day (TID) | ORAL | Status: DC | PRN

## 2015-01-26 MED ORDER — PANTOPRAZOLE SODIUM 40 MG PO TBEC
40.0000 mg | DELAYED_RELEASE_TABLET | Freq: Every day | ORAL | Status: DC
  Filled 2015-01-26: qty 1

## 2015-01-26 NOTE — ED Notes (Signed)
Patient to be transferred to OBS unit after 730.
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
## 2015-01-26 NOTE — Progress Notes (Signed)
[place] INPATIENT:  Family/Significant Other Suicide Prevention Education  Suicide Prevention Education:  Patient Refusal for Family/Significant Other Suicide Prevention Education: The patient Dalton [name] has refused to provide written consent for family/significant other to be provided Family/Significant Other Suicide Prevention Education during admission and/or prior to discharge.  Physician notified.  Loyal Gambler B 01/26/2015, 10:23 PM

## 2015-01-27 DIAGNOSIS — F1494 Cocaine use, unspecified with cocaine-induced mood disorder: Secondary | ICD-10-CM | POA: Diagnosis not present

## 2015-01-27 DIAGNOSIS — I739 Peripheral vascular disease, unspecified: Secondary | ICD-10-CM | POA: Diagnosis not present

## 2015-01-27 DIAGNOSIS — I110 Hypertensive heart disease with heart failure: Secondary | ICD-10-CM | POA: Diagnosis not present

## 2015-01-27 DIAGNOSIS — Z888 Allergy status to other drugs, medicaments and biological substances status: Secondary | ICD-10-CM | POA: Diagnosis not present

## 2015-01-27 DIAGNOSIS — M549 Dorsalgia, unspecified: Secondary | ICD-10-CM | POA: Diagnosis not present

## 2015-01-27 DIAGNOSIS — F1414 Cocaine abuse with cocaine-induced mood disorder: Secondary | ICD-10-CM | POA: Diagnosis not present

## 2015-01-27 DIAGNOSIS — Z8711 Personal history of peptic ulcer disease: Secondary | ICD-10-CM | POA: Diagnosis not present

## 2015-01-27 DIAGNOSIS — G8929 Other chronic pain: Secondary | ICD-10-CM | POA: Diagnosis not present

## 2015-01-27 DIAGNOSIS — R45851 Suicidal ideations: Secondary | ICD-10-CM

## 2015-01-27 DIAGNOSIS — Z79899 Other long term (current) drug therapy: Secondary | ICD-10-CM | POA: Diagnosis not present

## 2015-01-27 DIAGNOSIS — E7800 Pure hypercholesterolemia, unspecified: Secondary | ICD-10-CM | POA: Diagnosis not present

## 2015-01-27 DIAGNOSIS — I5020 Unspecified systolic (congestive) heart failure: Secondary | ICD-10-CM | POA: Diagnosis not present

## 2015-01-27 DIAGNOSIS — Z951 Presence of aortocoronary bypass graft: Secondary | ICD-10-CM | POA: Diagnosis not present

## 2015-01-27 DIAGNOSIS — I2510 Atherosclerotic heart disease of native coronary artery without angina pectoris: Secondary | ICD-10-CM | POA: Diagnosis not present

## 2015-01-27 DIAGNOSIS — J42 Unspecified chronic bronchitis: Secondary | ICD-10-CM | POA: Diagnosis not present

## 2015-01-27 DIAGNOSIS — E119 Type 2 diabetes mellitus without complications: Secondary | ICD-10-CM | POA: Diagnosis not present

## 2015-01-27 DIAGNOSIS — F329 Major depressive disorder, single episode, unspecified: Secondary | ICD-10-CM | POA: Diagnosis not present

## 2015-01-27 DIAGNOSIS — Z886 Allergy status to analgesic agent status: Secondary | ICD-10-CM | POA: Diagnosis not present

## 2015-01-27 DIAGNOSIS — I252 Old myocardial infarction: Secondary | ICD-10-CM | POA: Diagnosis not present

## 2015-01-27 DIAGNOSIS — K219 Gastro-esophageal reflux disease without esophagitis: Secondary | ICD-10-CM | POA: Diagnosis not present

## 2015-01-27 MED ORDER — ATORVASTATIN CALCIUM 10 MG PO TABS: 10.0000 mg | ORAL_TABLET | Freq: Every morning | ORAL | Status: DC

## 2015-01-27 MED ORDER — LISINOPRIL 10 MG PO TABS: 5.0000 mg | ORAL_TABLET | Freq: Every day | ORAL | Status: DC

## 2015-01-27 MED ORDER — METFORMIN HCL 1000 MG PO TABS: 1000.0000 mg | ORAL_TABLET | Freq: Every day | ORAL | Status: DC

## 2015-01-27 MED ORDER — LEVETIRACETAM 500 MG PO TABS: 500.0000 mg | ORAL_TABLET | Freq: Two times a day (BID) | ORAL | Status: DC

## 2015-01-27 MED ORDER — SERTRALINE HCL 100 MG PO TABS: 100.0000 mg | ORAL_TABLET | Freq: Every day | ORAL | Status: DC

## 2015-01-27 MED ORDER — METOPROLOL TARTRATE 25 MG PO TABS: 25.0000 mg | ORAL_TABLET | Freq: Two times a day (BID) | ORAL | Status: DC

## 2015-01-27 MED ORDER — HYDROCHLOROTHIAZIDE 25 MG PO TABS: 25.0000 mg | ORAL_TABLET | Freq: Every day | ORAL | Status: DC

## 2015-01-27 MED ORDER — LAMOTRIGINE 200 MG PO TABS: 200.0000 mg | ORAL_TABLET | Freq: Every day | ORAL | Status: DC

## 2015-01-27 MED ORDER — BECLOMETHASONE DIPROPIONATE 80 MCG/ACT IN AERS: 2.0000 | INHALATION_SPRAY | Freq: Two times a day (BID) | RESPIRATORY_TRACT | Status: DC

## 2015-01-27 MED ORDER — ALBUTEROL SULFATE (2.5 MG/3ML) 0.083% IN NEBU: 2.5000 mg | INHALATION_SOLUTION | Freq: Two times a day (BID) | RESPIRATORY_TRACT | Status: DC

## 2015-01-27 MED ORDER — FUROSEMIDE 40 MG PO TABS: 40.0000 mg | ORAL_TABLET | Freq: Every day | ORAL | Status: DC

## 2015-01-27 MED ORDER — TAMSULOSIN HCL 0.4 MG PO CAPS: 0.4000 mg | ORAL_CAPSULE | Freq: Every day | ORAL | Status: DC

## 2015-01-27 MED ORDER — ESOMEPRAZOLE MAGNESIUM 40 MG PO CPDR
40.0000 mg | DELAYED_RELEASE_CAPSULE | Freq: Two times a day (BID) | ORAL | Status: DC
Start: 2015-01-27 — End: 2015-12-09
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
## 2015-01-27 NOTE — Progress Notes (Signed)
Patient ID: Dalton [name], male   DOB: 05-24-1960, 54 y.o.   MRN: [id number] Patient refused medications and breathing treatment today. Patient has slept most of this AM.  Eating well. Making multiple phone calls. Denies SI and states he is ready for discharge.

## 2015-01-27 NOTE — BH Assessment (Signed)
[place] Assessment Progress Note  Patient will be discharged this date and follow up with his primary care physician at [facility] who informed patient that they will assist him with a  referral  to a Pain Management Center. This Probation officer discussed at length with patient in reference to pain management  and what is available in the immediate area to assist patient with alternative pain management resources to include therapy and address substance abuse issues. Patient will also follow up with his therapist Duwaine Maxin for individual therapy to address Mental Health issues and explore medication management issues. Patient and this Probation officer discussed relapse prevention skills and patient was given SA information to prevent relapse.

## 2015-01-27 NOTE — Discharge Instructions (Signed)
Patient will be discharged this date and follow up with his primary care physician at [facility] who informed patient that they will assist him with a  referral  to a Pain Management Center. This Probation officer discussed at length with patient in reference to pain management  and what is available in the immediate area to assist patient with alternative pain management resources to include therapy and address substance abuse issues. Patient will also follow up with his therapist Duwaine Maxin for individual therapy to address Mental Health issues and explore medication management issues. Patient and this Probation officer discussed relapse prevention skills and patient was given SA information to prevent relapse. At the time of discharge [facility] returned this writer's call and stated they would make a referral to [facility] in [facility] [phone].

## 2015-01-27 NOTE — Plan of Care (Signed)
[place] Observation Crisis Plan  Reason for Crisis Plan:    Plan of Care:  Referral for Inpatient Hospitalization  Family Support:   SISTERS   Current Living Environment:   Insurance:   Hospital Account    Name Acct ID Class Status Primary Coverage   Dalton [name], Dalton [name] [id number] [place]        Guarantor Account (for Hospital Account [id number])    Name Relation to Pt Service Area Active? Acct Type   Dalton [name] Self [facility] Yes [facility]   Address Phone       [place], [place] [address] [phone](H)          Coverage Information (for Hospital Account [id number])    F/O Payor/Plan Precert #   [place] #   Dalton [name], Dalton [name] [id number]   Address Phone   [place] [address] [phone]      Legal Guardian:   self  Primary Care Provider:  Philis Fendt, MD  Current Outpatient Providers:  Dalton [name]  Psychiatrist:   none  Counselor/Therapist:   none   Compliant with Medications:  Yes  Additional Information:   Dalton [name] 12/5/20162:13 AM

## 2015-01-27 NOTE — Progress Notes (Signed)
Patient ID: Dalton [name], male   DOB: [birth year], 54 y.o.   MRN: [id number] Patient discharged per MD orders. Patient given education regarding follow-up appointments and medications. Patient denies any questions or concerns about these instructions. Patient was escorted to locker and given belongings before discharge to hospital lobby. Patient currently denies SI/HI and auditory and visual hallucinations on discharge.
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
## 2015-02-06 DIAGNOSIS — J410 Simple chronic bronchitis: Secondary | ICD-10-CM | POA: Diagnosis not present

## 2015-04-16 ENCOUNTER — Emergency Department (HOSPITAL_COMMUNITY): Payer: Medicare Other

## 2015-04-16 ENCOUNTER — Encounter (HOSPITAL_COMMUNITY): Payer: Self-pay | Admitting: Cardiology

## 2015-04-16 ENCOUNTER — Emergency Department (HOSPITAL_COMMUNITY)
Admission: EM | Admit: 2015-04-16 | Discharge: 2015-04-16 | Disposition: A | Discharge status: Home / Self Care | Payer: Medicare Other | Attending: Emergency Medicine | Admitting: Emergency Medicine

## 2015-04-16 DIAGNOSIS — M1990 Unspecified osteoarthritis, unspecified site: Secondary | ICD-10-CM | POA: Diagnosis not present

## 2015-04-16 DIAGNOSIS — F329 Major depressive disorder, single episode, unspecified: Secondary | ICD-10-CM | POA: Insufficient documentation

## 2015-04-16 DIAGNOSIS — Z7982 Long term (current) use of aspirin: Secondary | ICD-10-CM | POA: Diagnosis not present

## 2015-04-16 DIAGNOSIS — R05 Cough: Secondary | ICD-10-CM | POA: Diagnosis present

## 2015-04-16 DIAGNOSIS — Z8711 Personal history of peptic ulcer disease: Secondary | ICD-10-CM | POA: Diagnosis not present

## 2015-04-16 DIAGNOSIS — I2510 Atherosclerotic heart disease of native coronary artery without angina pectoris: Secondary | ICD-10-CM | POA: Insufficient documentation

## 2015-04-16 DIAGNOSIS — I252 Old myocardial infarction: Secondary | ICD-10-CM | POA: Diagnosis not present

## 2015-04-16 DIAGNOSIS — G8929 Other chronic pain: Secondary | ICD-10-CM | POA: Diagnosis not present

## 2015-04-16 DIAGNOSIS — Z951 Presence of aortocoronary bypass graft: Secondary | ICD-10-CM | POA: Diagnosis not present

## 2015-04-16 DIAGNOSIS — I509 Heart failure, unspecified: Secondary | ICD-10-CM | POA: Insufficient documentation

## 2015-04-16 DIAGNOSIS — Z79899 Other long term (current) drug therapy: Secondary | ICD-10-CM | POA: Insufficient documentation

## 2015-04-16 DIAGNOSIS — Z9889 Other specified postprocedural states: Secondary | ICD-10-CM | POA: Insufficient documentation

## 2015-04-16 DIAGNOSIS — I10 Essential (primary) hypertension: Secondary | ICD-10-CM | POA: Insufficient documentation

## 2015-04-16 DIAGNOSIS — Z862 Personal history of diseases of the blood and blood-forming organs and certain disorders involving the immune mechanism: Secondary | ICD-10-CM | POA: Diagnosis not present

## 2015-04-16 DIAGNOSIS — Z7951 Long term (current) use of inhaled steroids: Secondary | ICD-10-CM | POA: Diagnosis not present

## 2015-04-16 DIAGNOSIS — E7800 Pure hypercholesterolemia, unspecified: Secondary | ICD-10-CM | POA: Diagnosis not present

## 2015-04-16 DIAGNOSIS — J069 Acute upper respiratory infection, unspecified: Secondary | ICD-10-CM | POA: Diagnosis not present

## 2015-04-16 DIAGNOSIS — Z7984 Long term (current) use of oral hypoglycemic drugs: Secondary | ICD-10-CM | POA: Insufficient documentation

## 2015-04-16 DIAGNOSIS — Z87828 Personal history of other (healed) physical injury and trauma: Secondary | ICD-10-CM | POA: Diagnosis not present

## 2015-04-16 DIAGNOSIS — Z7902 Long term (current) use of antithrombotics/antiplatelets: Secondary | ICD-10-CM | POA: Insufficient documentation

## 2015-04-16 DIAGNOSIS — K219 Gastro-esophageal reflux disease without esophagitis: Secondary | ICD-10-CM | POA: Insufficient documentation

## 2015-04-16 LAB — COMPREHENSIVE METABOLIC PANEL
ALT: 19 U/L (ref 17–63)
AST: 24 U/L (ref 15–41)
Albumin: 3.8 g/dL (ref 3.5–5.0)
Alkaline Phosphatase: 86 U/L (ref 38–126)
Anion gap: 8 (ref 5–15)
BUN: 8 mg/dL (ref 6–20)
CHLORIDE: 106 mmol/L (ref 101–111)
CO2: 27 mmol/L (ref 22–32)
CREATININE: 1 mg/dL (ref 0.61–1.24)
Calcium: 9.2 mg/dL (ref 8.9–10.3)
GFR calc Af Amer: >60 mL/min (ref >60)
Glucose, Bld: 101 mg/dL — ABNORMAL HIGH (ref 65–99)
Potassium: 3.5 mmol/L (ref 3.5–5.1)
SODIUM: 141 mmol/L (ref 135–145)
Total Bilirubin: 0.6 mg/dL (ref 0.3–1.2)
Total Protein: 7.1 g/dL (ref 6.5–8.1)

## 2015-04-16 LAB — URINALYSIS, ROUTINE W REFLEX MICROSCOPIC
BILIRUBIN URINE: NEGATIVE
Glucose, UA: NEGATIVE mg/dL
HGB URINE DIPSTICK: NEGATIVE
KETONES UR: NEGATIVE mg/dL
Leukocytes, UA: NEGATIVE
Nitrite: NEGATIVE
PROTEIN: NEGATIVE mg/dL
Specific Gravity, Urine: 1.015 (ref 1.005–1.030)
pH: 5.5 (ref 5.0–8.0)

## 2015-04-16 LAB — CBC
HCT: 38.3 % — ABNORMAL LOW (ref 39.0–52.0)
Hemoglobin: 13.1 g/dL (ref 13.0–17.0)
MCH: 28.9 pg (ref 26.0–34.0)
MCHC: 34.2 g/dL (ref 30.0–36.0)
MCV: 84.4 fL (ref 78.0–100.0)
PLATELETS: 148 10*3/uL — AB (ref 150–400)
RBC: 4.54 MIL/uL (ref 4.22–5.81)
RDW: 13.5 % (ref 11.5–15.5)
WBC: 4.5 10*3/uL (ref 4.0–10.5)

## 2015-04-16 LAB — LIPASE, BLOOD: LIPASE: 34 U/L (ref 11–51)

## 2015-04-16 LAB — CBG MONITORING, ED: GLUCOSE-CAPILLARY: 80 mg/dL (ref 65–99)

## 2015-04-16 MED ORDER — HYDROXYZINE HCL 25 MG PO TABS: 25.0000 mg | ORAL_TABLET | Freq: Four times a day (QID) | ORAL | Status: DC

## 2015-04-16 MED ORDER — OXYCODONE-ACETAMINOPHEN 5-325 MG PO TABS
2.0000 | ORAL_TABLET | Freq: Once | ORAL | Status: AC
  Administered 2015-04-16: 2 via ORAL
  Filled 2015-04-16: qty 2

## 2015-04-16 NOTE — ED Notes (Signed)
Pt CBG, 81. Nurse was notified.

## 2015-04-16 NOTE — ED Notes (Signed)
Warm blanket given to pt. Pt. Son given sprite and a bagged lunch

## 2015-04-16 NOTE — ED Provider Notes (Signed)
CSN: [id number]     Arrival date & time 04/16/15  1201 History   First MD Initiated Contact with Patient 04/16/15 1808     Chief Complaint  Patient presents with  . Muscle Pain  . Emesis     (Consider location/radiation/quality/duration/timing/severity/associated sxs/prior Treatment) Patient is a 55 y.o. male presenting with musculoskeletal pain and vomiting. The history is provided by the patient. No language interpreter was used.  Muscle Pain This is a chronic problem. The current episode started more than 1 year ago. The problem occurs constantly. The problem has been unchanged. Associated symptoms include vomiting. Nothing aggravates the symptoms. He has tried nothing for the symptoms. The treatment provided moderate relief.  Emesis Pt vomitted once yesterday.  Pt has chronic pain from back injurys.  Pt reports he has not been taking anything for pain for 2 months. Pt is trying to get into pain management.  Pt complains of a cough and congestion.  Pt thinks abdominal pain is from coughing  Past Medical History  Diagnosis Date  . Hypertension   . High cholesterol   . Congestive heart failure (CHF) ([place])   . Arthritis   . MI (myocardial infarction) ([place])     7, last one was in 2011  . PUD (peptic ulcer disease)     in 1990s, secondary to medication  . Laceration of right hand 11/27/10  . Chronic back pain     [facility] in [place]  . Chronic bronchitis   . CHF (congestive heart failure) ([place])   . Bronchitis, chronic ([place])   . Anemia   . GERD (gastroesophageal reflux disease)   . Frequency of urination   . Seizures ([place])     entire life, last seizure in 2011;unknown etiology-pt sts heriditary  . Tonsillitis, chronic     Dr. Vicki Mallet in [place]  . Shortness of breath     with exertion  . Depression   . Headache(784.0)   . Laceration of wrist 2007 BIL FOREARMS  . Coronary artery disease    Past Surgical History  Procedure Laterality Date  . Carpal tunnel  release      bilateral  . Knee surgery      left-plate to left knee cap from accident  . Coronary artery bypass graft  2002    3 vessels  . Back surgery      3-  . Savory dilation  12/28/2010    SLF:([facility])J-shaped stomach/nodular mocosa in the distal esophagus/empiric dilation 65mm  . Colonoscopy  12/28/10    SLF: ([facility])Internal hemorrhoids/four small colon polyps  . Esophagogastroduodenoscopy N/A 05/30/2012    SLF: UNCONTROLLED GERD DUE TO LIFESTYLE CHOICE/WEIGHT GAIN/MILD Non-erosive gastritis  . Biopsy N/A 05/30/2012    Procedure: BIOPSY;  Surgeon: Danie Binder, MD;  Location: [facility];  Service: Endoscopy;  Laterality: N/A;  . Left heart catheterization with coronary angiogram N/A 08/31/2011    Procedure: LEFT HEART CATHETERIZATION WITH CORONARY ANGIOGRAM;  Surgeon: Laverda Page, MD;  Location: [facility] CATH LAB;  Service: Cardiovascular;  Laterality: N/A;   Family History  Problem Relation Age of Onset  . Diabetes Mother   . Hypertension Mother   . Hypertension Father   . Diabetes Father   . Heart attack      mother, father, brother, sister all deceased due to MI  . Colon cancer Neg Hx   . Liver disease Neg Hx   . Anesthesia problems Neg Hx   . Hypotension Neg Hx   . Malignant  hyperthermia Neg Hx   . Pseudochol deficiency Neg Hx   . Colon polyps Neg Hx    Social History  Substance Use Topics  . Smoking status: Never Smoker   . Smokeless tobacco: None     Comment: Never Smoked  . Alcohol Use: No    Review of Systems  Gastrointestinal: Positive for vomiting.  All other systems reviewed and are negative.     Allergies  Gabapentin; Ibuprofen; Zolpidem tartrate; and Naproxen  Home Medications   Prior to Admission medications   Medication Sig Start Date End Date Taking? Authorizing Provider  albuterol (PROAIR HFA) 108 (90 BASE) MCG/ACT inhaler Inhale 2 puffs into the lungs every 6 (six) hours as needed for wheezing (for shortness of breath).   Yes Historical Provider, MD   albuterol (PROVENTIL) (2.5 MG/3ML) 0.083% nebulizer solution Take 3 mLs (2.5 mg total) by nebulization 2 (two) times daily. 01/27/15  Yes Benjamine Mola, FNP  alprazolam Duanne Moron) 2 MG tablet Take 2 mg by mouth 2 (two) times daily.   Yes Historical Provider, MD  aspirin 325 MG tablet Take 325 mg by mouth daily.   Yes Historical Provider, MD  atorvastatin (LIPITOR) 10 MG tablet Take 1 tablet (10 mg total) by mouth every morning. 01/27/15  Yes Benjamine Mola, FNP  beclomethasone (QVAR) 80 MCG/ACT inhaler Inhale 2 puffs into the lungs 2 (two) times daily. 01/27/15  Yes Benjamine Mola, FNP  cetirizine (ZYRTEC) 10 MG tablet Take 10 mg by mouth daily as needed for allergies or rhinitis. Reported on 04/16/2015   Yes Historical Provider, MD  clopidogrel (PLAVIX) 75 MG tablet Take 75 mg by mouth daily before breakfast. 04/07/15  Yes Historical Provider, MD  diclofenac sodium (VOLTAREN) 1 % GEL Place 1 application onto the skin 4 (four) times daily as needed. 10/08/14  Yes Historical Provider, MD  esomeprazole (NEXIUM) 40 MG capsule Take 1 capsule (40 mg total) by mouth 2 (two) times daily. 01/27/15  Yes Benjamine Mola, FNP  fluticasone (FLONASE) 50 MCG/ACT nasal spray Place 2 sprays into both nostrils daily as needed. 03/17/15  Yes Historical Provider, MD  furosemide (LASIX) 40 MG tablet Take 1 tablet (40 mg total) by mouth daily. 01/27/15  Yes Benjamine Mola, FNP  hydrochlorothiazide (HYDRODIURIL) 25 MG tablet Take 1 tablet (25 mg total) by mouth daily. 01/27/15  Yes Benjamine Mola, FNP  ipratropium (ATROVENT) 0.02 % nebulizer solution Take 0.5 mg by nebulization every 6 (six) hours as needed for wheezing or shortness of breath.   Yes Historical Provider, MD  lamoTRIgine (LAMICTAL) 200 MG tablet Take 1 tablet (200 mg total) by mouth daily. 01/27/15  Yes Benjamine Mola, FNP  levETIRAcetam (KEPPRA) 500 MG tablet Take 1 tablet (500 mg total) by mouth every 12 (twelve) hours. 01/27/15  Yes Benjamine Mola, FNP  lisinopril  (PRINIVIL,ZESTRIL) 10 MG tablet Take 0.5 tablets (5 mg total) by mouth daily. Take half of a tablet 01/27/15  Yes Benjamine Mola, FNP  metFORMIN (GLUCOPHAGE) 1000 MG tablet Take 1 tablet (1,000 mg total) by mouth daily with breakfast. 01/27/15  Yes Benjamine Mola, FNP  metoprolol tartrate (LOPRESSOR) 25 MG tablet Take 1 tablet (25 mg total) by mouth 2 (two) times daily. 01/27/15  Yes Benjamine Mola, FNP  potassium chloride SA (K-DUR,KLOR-CON) 20 MEQ tablet Take 20 mEq by mouth 2 (two) times daily.   Yes Historical Provider, MD  pregabalin (LYRICA) 100 MG capsule Take 100-200 mg by mouth 4 (four)  times daily - after meals and at bedtime. Takes 1 cap 3 times daily and 2 caps at bedtime 12/20/14  Yes Historical Provider, MD  sertraline (ZOLOFT) 100 MG tablet Take 1 tablet (100 mg total) by mouth daily. 01/27/15  Yes Benjamine Mola, FNP  tamsulosin (FLOMAX) 0.4 MG CAPS capsule Take 1 capsule (0.4 mg total) by mouth daily after breakfast. 01/27/15  Yes Elyse Jarvis Withrow, FNP   BP 128/78 mmHg  Pulse 64  Temp(Src) 98.6 F (37 C) (Oral)  Resp 15  SpO2 100% Physical Exam  Constitutional: He is oriented to person, place, and time. He appears well-developed and well-nourished.  HENT:  Head: Normocephalic and atraumatic.  Right Ear: External ear normal.  Left Ear: External ear normal.  Nose: Nose normal.  Mouth/Throat: Oropharynx is clear and moist.  Eyes: Conjunctivae and EOM are normal. Pupils are equal, round, and reactive to light.  Neck: Normal range of motion.  Cardiovascular: Normal rate and normal heart sounds.   Pulmonary/Chest: Effort normal.  Abdominal: Soft. He exhibits no distension.  Musculoskeletal: Normal range of motion.  Neurological: He is alert and oriented to person, place, and time.  Skin: Skin is warm.  Psychiatric: He has a normal mood and affect.  Nursing note and vitals reviewed.   ED Course  Procedures (including critical care time) Labs Review Labs Reviewed   COMPREHENSIVE METABOLIC PANEL - Abnormal; Notable for the following:    Glucose, Bld 101 (*)    All other components within normal limits  CBC - Abnormal; Notable for the following:    HCT 38.3 (*)    Platelets 148 (*)    All other components within normal limits  LIPASE, BLOOD  URINALYSIS, ROUTINE W REFLEX MICROSCOPIC (NOT AT [facility])  CBG MONITORING, ED    Imaging Review Dg Chest 2 View  04/16/2015  CLINICAL DATA:  Nausea vomiting and productive cough for 6 days. Fever and slight shortness of breath. EXAM: CHEST  2 VIEW COMPARISON:  01/24/2015 FINDINGS: The lungs are clear wiithout focal pneumonia, edema, pneumothorax or pleural effusion. Low volume film. The cardio pericardial silhouette is enlarged. Patient is status post median sternotomy. The visualized bony structures of the thorax are intact. IMPRESSION: Low volume film with mild enlargement of the cardiopericardial silhouette but no acute cardiopulmonary findings. Electronically Signed   By: Misty Stanley M.D.   On: 04/16/2015 18:38   I have personally reviewed and evaluated these images and lab results as part of my medical decision-making.   EKG Interpretation None      MDM   Final diagnoses:  URI (upper respiratory infection)  Chronic pain    An After Visit Summary was printed and given to the patient.  Meds ordered this encounter  Medications  . alprazolam (XANAX) 2 MG tablet    Sig: Take 2 mg by mouth 2 (two) times daily.  . potassium chloride SA (K-DUR,KLOR-CON) 20 MEQ tablet    Sig: Take 20 mEq by mouth 2 (two) times daily.  [name] Kitchen ipratropium (ATROVENT) 0.02 % nebulizer solution    Sig: Take 0.5 mg by nebulization every 6 (six) hours as needed for wheezing or shortness of breath.  . DISCONTD: albuterol (PROVENTIL) (2.5 MG/3ML) 0.083% nebulizer solution    Sig: Take 2.5 mg by nebulization every 6 (six) hours as needed for wheezing or shortness of breath. Reported on 04/16/2015  . aspirin 325 MG tablet    Sig: Take  325 mg by mouth daily.  . pregabalin (LYRICA) 100 MG  capsule    Sig: Take 100-200 mg by mouth 4 (four) times daily - after meals and at bedtime. Takes 1 cap 3 times daily and 2 caps at bedtime  . clopidogrel (PLAVIX) 75 MG tablet    Sig: Take 75 mg by mouth daily before breakfast.  . diclofenac sodium (VOLTAREN) 1 % GEL    Sig: Place 1 application onto the skin 4 (four) times daily as needed.  . fluticasone (FLONASE) 50 MCG/ACT nasal spray    Sig: Place 2 sprays into both nostrils daily as needed.  [name] Kitchen oxyCODONE-acetaminophen (PERCOCET/ROXICET) 5-325 MG per tablet 2 tablet    Sig:   . hydrOXYzine (ATARAX/VISTARIL) 25 MG tablet    Sig: Take 1 tablet (25 mg total) by mouth every 6 (six) hours.    Dispense:  12 tablet    Refill:  0    Order Specific Question:  Supervising Provider    Answer:  Noemi [place], PA-C 04/16/15 2258  Varney Biles, MD 04/18/15 0040

## 2015-04-16 NOTE — ED Notes (Signed)
Pt reports generalized abd pain for the past couple of days. States he has been unable to keep anything down.

## 2015-04-16 NOTE — ED Notes (Signed)
Patient left at this time with all belongings. 

## 2015-04-16 NOTE — ED Notes (Signed)
Pt. Reports having a sore throat, neck pain, back pain, body aches , chills and sweats.  Pt. Vomited X1 early this am. Has not since then

## 2015-04-16 NOTE — Discharge Instructions (Signed)
Chronic Back Pain  When back pain lasts longer than 3 months, it is called chronic back pain.People with chronic back pain often go through certain periods that are more intense (flare-ups).  CAUSES Chronic back pain can be caused by wear and tear (degeneration) on different structures in your back. These structures include:  The bones of your spine (vertebrae) and the joints surrounding your spinal cord and nerve roots (facets).  The strong, fibrous tissues that connect your vertebrae (ligaments). Degeneration of these structures may result in pressure on your nerves. This can lead to constant pain. HOME CARE INSTRUCTIONS  Avoid bending, heavy lifting, prolonged sitting, and activities which make the problem worse.  Take brief periods of rest throughout the day to reduce your pain. Lying down or standing usually is better than sitting while you are resting.  Take over-the-counter or prescription medicines only as directed by your caregiver. SEEK IMMEDIATE MEDICAL CARE IF:   You have weakness or numbness in one of your legs or feet.  You have trouble controlling your bladder or bowels.  You have nausea, vomiting, abdominal pain, shortness of breath, or fainting.   This information is not intended to replace advice given to you by your health care provider. Make sure you discuss any questions you have with your health care provider.   Document Released: 03/18/2004 Document Revised: 05/03/2011 Document Reviewed: 07/29/2014 Elsevier Interactive Patient Education 2016 Else[employer] Upper Respiratory Infection, Adult Most upper respiratory infections (URIs) are a viral infection of the air passages leading to the lungs. A URI affects the nose, throat, and upper air passages. The most common type of URI is nasopharyngitis and is typically referred to as "the common cold." URIs run their course and usually go away on their own. Most of the time, a URI does not require medical attention, but  sometimes a bacterial infection in the upper airways can follow a viral infection. This is called a secondary infection. Sinus and middle ear infections are common types of secondary upper respiratory infections. Bacterial pneumonia can also complicate a URI. A URI can worsen asthma and chronic obstructive pulmonary disease (COPD). Sometimes, these complications can require emergency medical care and may be life threatening.  CAUSES Almost all URIs are caused by viruses. A virus is a type of germ and can spread from one person to another.  RISKS FACTORS You may be at risk for a URI if:   You smoke.   You have chronic heart or lung disease.  You have a weakened defense (immune) system.   You are very young or very old.   You have nasal allergies or asthma.  You work in crowded or poorly ventilated areas.  You work in health care facilities or schools. SIGNS AND SYMPTOMS  Symptoms typically develop 2-3 days after you come in contact with a cold virus. Most viral URIs last 7-10 days. However, viral URIs from the influenza virus (flu virus) can last 14-18 days and are typically more severe. Symptoms may include:   Runny or stuffy (congested) nose.   Sneezing.   Cough.   Sore throat.   Headache.   Fatigue.   Fever.   Loss of appetite.   Pain in your forehead, behind your eyes, and over your cheekbones (sinus pain).  Muscle aches.  DIAGNOSIS  Your health care provider may diagnose a URI by:  Physical exam.  Tests to check that your symptoms are not due to another condition such as:  Strep throat.  Sinusitis.  Pneumonia.  Asthma. TREATMENT  A URI goes away on its own with time. It cannot be cured with medicines, but medicines may be prescribed or recommended to relieve symptoms. Medicines may help:  Reduce your fever.  Reduce your cough.  Relieve nasal congestion. HOME CARE INSTRUCTIONS   Take medicines only as directed by your health care  provider.   Gargle warm saltwater or take cough drops to comfort your throat as directed by your health care provider.  Use a warm mist humidifier or inhale steam from a shower to increase air moisture. This may make it easier to breathe.  Drink enough fluid to keep your urine clear or pale yellow.   Eat soups and other clear broths and maintain good nutrition.   Rest as needed.   Return to work when your temperature has returned to normal or as your health care provider advises. You may need to stay home longer to avoid infecting others. You can also use a face mask and careful hand washing to prevent spread of the virus.  Increase the usage of your inhaler if you have asthma.   Do not use any tobacco products, including cigarettes, chewing tobacco, or electronic cigarettes. If you need help quitting, ask your health care provider. PREVENTION  The best way to protect yourself from getting a cold is to practice good hygiene.   Avoid oral or hand contact with people with cold symptoms.   Wash your hands often if contact occurs.  There is no clear evidence that vitamin C, vitamin E, echinacea, or exercise reduces the chance of developing a cold. However, it is always recommended to get plenty of rest, exercise, and practice good nutrition.  SEEK MEDICAL CARE IF:   You are getting worse rather than better.   Your symptoms are not controlled by medicine.   You have chills.  You have worsening shortness of breath.  You have brown or red mucus.  You have yellow or brown nasal discharge.  You have pain in your face, especially when you bend forward.  You have a fever.  You have swollen neck glands.  You have pain while swallowing.  You have white areas in the back of your throat. SEEK IMMEDIATE MEDICAL CARE IF:   You have severe or persistent:  Headache.  Ear pain.  Sinus pain.  Chest pain.  You have chronic lung disease and any of the  following:  Wheezing.  Prolonged cough.  Coughing up blood.  A change in your usual mucus.  You have a stiff neck.  You have changes in your:  Vision.  Hearing.  Thinking.  Mood. MAKE SURE YOU:   Understand these instructions.  Will watch your condition.  Will get help right away if you are not doing well or get worse.   This information is not intended to replace advice given to you by your health care provider. Make sure you discuss any questions you have with your health care provider.   Document Released: 08/04/2000 Document Revised: 06/25/2014 Document Reviewed: 05/16/2013 Elsevier Interactive Patient Education [employer].

## 2015-04-16 NOTE — ED Notes (Signed)
Pt unable to void at this time. 

## 2015-04-30 ENCOUNTER — Other Ambulatory Visit: Payer: Self-pay | Admitting: Neurosurgery

## 2015-04-30 DIAGNOSIS — M5416 Radiculopathy, lumbar region: Secondary | ICD-10-CM

## 2015-05-08 ENCOUNTER — Ambulatory Visit: Payer: Self-pay | Admitting: Gastroenterology

## 2015-05-08 ENCOUNTER — Telehealth: Payer: Self-pay | Admitting: Gastroenterology

## 2015-05-08 ENCOUNTER — Encounter: Payer: Self-pay | Admitting: Gastroenterology

## 2015-05-08 NOTE — Telephone Encounter (Signed)
PATIENT WAS A NO SHOW AND LETTER SENT  °

## 2015-05-09 ENCOUNTER — Ambulatory Visit
Admission: RE | Admit: 2015-05-09 | Discharge: 2015-05-09 | Disposition: A | Discharge status: Home / Self Care | Payer: Medicare Other | Source: Ambulatory Visit | Attending: Neurosurgery | Admitting: Neurosurgery

## 2015-05-09 DIAGNOSIS — M5416 Radiculopathy, lumbar region: Secondary | ICD-10-CM

## 2015-05-09 MED ORDER — GADOBENATE DIMEGLUMINE 529 MG/ML IV SOLN
20.0000 mL | Freq: Once | INTRAVENOUS | Status: AC | PRN
  Administered 2015-05-09: 20 mL via INTRAVENOUS

## 2015-05-27 ENCOUNTER — Encounter: Payer: Self-pay | Admitting: Gastroenterology

## 2015-05-27 ENCOUNTER — Ambulatory Visit: Payer: Self-pay | Admitting: Gastroenterology

## 2015-05-27 ENCOUNTER — Telehealth: Payer: Self-pay | Admitting: Gastroenterology

## 2015-05-27 NOTE — Telephone Encounter (Signed)
Pt was a no show

## 2015-05-27 NOTE — Telephone Encounter (Signed)
Second no-show. Please send letter regarding no show policy.

## 2015-05-27 NOTE — Telephone Encounter (Signed)
Letter mailed

## 2015-06-23 ENCOUNTER — Telehealth: Payer: Self-pay | Admitting: Gastroenterology

## 2015-06-23 ENCOUNTER — Ambulatory Visit: Payer: Self-pay | Admitting: Gastroenterology

## 2015-06-23 ENCOUNTER — Encounter: Payer: Self-pay | Admitting: Gastroenterology

## 2015-06-23 NOTE — Telephone Encounter (Signed)
PATIENT WAS A NO SHOW AND LETTER SENT  °

## 2015-07-24 NOTE — Progress Notes (Signed)
No show

## 2015-07-25 ENCOUNTER — Ambulatory Visit (INDEPENDENT_AMBULATORY_CARE_PROVIDER_SITE_OTHER): Payer: Self-pay | Admitting: Interventional Cardiology

## 2015-07-25 DIAGNOSIS — Z951 Presence of aortocoronary bypass graft: Secondary | ICD-10-CM

## 2015-07-25 DIAGNOSIS — I10 Essential (primary) hypertension: Secondary | ICD-10-CM

## 2015-07-25 DIAGNOSIS — I739 Peripheral vascular disease, unspecified: Secondary | ICD-10-CM

## 2015-07-30 ENCOUNTER — Encounter: Payer: Self-pay | Admitting: Interventional Cardiology

## 2015-08-07 ENCOUNTER — Encounter: Payer: Self-pay | Admitting: Interventional Cardiology

## 2015-10-10 ENCOUNTER — Emergency Department (HOSPITAL_COMMUNITY): Payer: Medicare Other

## 2015-10-10 ENCOUNTER — Emergency Department (HOSPITAL_COMMUNITY)
Admission: EM | Admit: 2015-10-10 | Discharge: 2015-10-10 | Disposition: A | Discharge status: Home / Self Care | Payer: Medicare Other | Attending: Emergency Medicine | Admitting: Emergency Medicine

## 2015-10-10 ENCOUNTER — Encounter (HOSPITAL_COMMUNITY): Payer: Self-pay

## 2015-10-10 DIAGNOSIS — M542 Cervicalgia: Secondary | ICD-10-CM | POA: Insufficient documentation

## 2015-10-10 DIAGNOSIS — Z79899 Other long term (current) drug therapy: Secondary | ICD-10-CM | POA: Insufficient documentation

## 2015-10-10 DIAGNOSIS — R358 Other polyuria: Secondary | ICD-10-CM | POA: Insufficient documentation

## 2015-10-10 DIAGNOSIS — I2510 Atherosclerotic heart disease of native coronary artery without angina pectoris: Secondary | ICD-10-CM | POA: Diagnosis not present

## 2015-10-10 DIAGNOSIS — R51 Headache: Secondary | ICD-10-CM | POA: Insufficient documentation

## 2015-10-10 DIAGNOSIS — Z7982 Long term (current) use of aspirin: Secondary | ICD-10-CM | POA: Diagnosis not present

## 2015-10-10 DIAGNOSIS — R3589 Other polyuria: Secondary | ICD-10-CM

## 2015-10-10 DIAGNOSIS — M7989 Other specified soft tissue disorders: Secondary | ICD-10-CM | POA: Diagnosis not present

## 2015-10-10 DIAGNOSIS — I509 Heart failure, unspecified: Secondary | ICD-10-CM | POA: Diagnosis not present

## 2015-10-10 DIAGNOSIS — I110 Hypertensive heart disease with heart failure: Secondary | ICD-10-CM | POA: Diagnosis not present

## 2015-10-10 DIAGNOSIS — R0602 Shortness of breath: Secondary | ICD-10-CM | POA: Diagnosis not present

## 2015-10-10 DIAGNOSIS — R0789 Other chest pain: Secondary | ICD-10-CM | POA: Diagnosis not present

## 2015-10-10 DIAGNOSIS — Z7984 Long term (current) use of oral hypoglycemic drugs: Secondary | ICD-10-CM | POA: Diagnosis not present

## 2015-10-10 DIAGNOSIS — R509 Fever, unspecified: Secondary | ICD-10-CM | POA: Diagnosis not present

## 2015-10-10 DIAGNOSIS — R197 Diarrhea, unspecified: Secondary | ICD-10-CM | POA: Diagnosis not present

## 2015-10-10 LAB — URINALYSIS, ROUTINE W REFLEX MICROSCOPIC
BILIRUBIN URINE: NEGATIVE
GLUCOSE, UA: NEGATIVE mg/dL
HGB URINE DIPSTICK: NEGATIVE
Ketones, ur: NEGATIVE mg/dL
Leukocytes, UA: NEGATIVE
Nitrite: NEGATIVE
PH: 6 (ref 5.0–8.0)
Protein, ur: NEGATIVE mg/dL

## 2015-10-10 LAB — I-STAT TROPONIN, ED: TROPONIN I, POC: 0 ng/mL (ref 0.00–0.08)

## 2015-10-10 LAB — CBC
HCT: 37.7 % — ABNORMAL LOW (ref 39.0–52.0)
HEMOGLOBIN: 13 g/dL (ref 13.0–17.0)
MCH: 29.5 pg (ref 26.0–34.0)
MCHC: 34.5 g/dL (ref 30.0–36.0)
MCV: 85.7 fL (ref 78.0–100.0)
PLATELETS: 169 10*3/uL (ref 150–400)
RBC: 4.4 MIL/uL (ref 4.22–5.81)
RDW: 13.9 % (ref 11.5–15.5)
WBC: 4.6 10*3/uL (ref 4.0–10.5)

## 2015-10-10 LAB — BASIC METABOLIC PANEL
ANION GAP: 3 — AB (ref 5–15)
BUN: 6 mg/dL (ref 6–20)
CALCIUM: 8.6 mg/dL — AB (ref 8.9–10.3)
CO2: 27 mmol/L (ref 22–32)
CREATININE: 1.05 mg/dL (ref 0.61–1.24)
Chloride: 104 mmol/L (ref 101–111)
GFR calc Af Amer: >60 mL/min (ref >60)
GLUCOSE: 90 mg/dL (ref 65–99)
Potassium: 2.9 mmol/L — ABNORMAL LOW (ref 3.5–5.1)
Sodium: 134 mmol/L — ABNORMAL LOW (ref 135–145)

## 2015-10-10 LAB — SEDIMENTATION RATE: SED RATE: 25 mm/h — AB (ref 0–16)

## 2015-10-10 LAB — I-STAT CG4 LACTIC ACID, ED: Lactic Acid, Venous: 0.74 mmol/L (ref 0.5–1.9)

## 2015-10-10 MED ORDER — NITROGLYCERIN 0.4 MG SL SUBL
SUBLINGUAL_TABLET | SUBLINGUAL | Status: AC
  Filled 2015-10-10: qty 1

## 2015-10-10 MED ORDER — POTASSIUM CHLORIDE CRYS ER 20 MEQ PO TBCR
60.0000 meq | EXTENDED_RELEASE_TABLET | Freq: Once | ORAL | Status: AC
  Administered 2015-10-10: 60 meq via ORAL
  Filled 2015-10-10: qty 3

## 2015-10-10 MED ORDER — NITROGLYCERIN 0.4 MG SL SUBL
0.4000 mg | SUBLINGUAL_TABLET | Freq: Once | SUBLINGUAL | Status: DC
  Filled 2015-10-10: qty 1

## 2015-10-10 MED ORDER — ACETAMINOPHEN 500 MG PO TABS
1000.0000 mg | ORAL_TABLET | Freq: Once | ORAL | Status: AC
  Administered 2015-10-10: 1000 mg via ORAL
  Filled 2015-10-10: qty 2

## 2015-10-10 MED ORDER — SODIUM CHLORIDE 0.9 % IV SOLN
1000.0000 mL | Freq: Once | INTRAVENOUS | Status: AC
  Administered 2015-10-10: 1000 mL via INTRAVENOUS

## 2015-10-10 MED ORDER — KETOROLAC TROMETHAMINE 30 MG/ML IJ SOLN
15.0000 mg | Freq: Once | INTRAMUSCULAR | Status: AC
  Administered 2015-10-10: 15 mg via INTRAVENOUS
  Filled 2015-10-10: qty 1

## 2015-10-10 MED ORDER — DIPHENHYDRAMINE HCL 50 MG/ML IJ SOLN
25.0000 mg | Freq: Once | INTRAMUSCULAR | Status: AC
  Administered 2015-10-10: 25 mg via INTRAVENOUS
  Filled 2015-10-10: qty 1

## 2015-10-10 NOTE — ED Notes (Signed)
New draw for green top for lactic acid order (last over 30 minutes old)

## 2015-10-10 NOTE — ED Notes (Addendum)
Lactic acid result 0.74 for blood drawn at 2130

## 2015-10-10 NOTE — ED Notes (Signed)
Pt blanketed by Dr Inda Castle -

## 2015-10-10 NOTE — ED Notes (Signed)
Physician eval complete, pt to radiology via stretcher

## 2015-10-10 NOTE — ED Notes (Signed)
Repeat EKG after discussion with Dr Jeanell Sparrow regarding pt pulse going from 84-88 to 150-156- Strips provided for Dr Jeanell Sparrow

## 2015-10-10 NOTE — ED Notes (Signed)
Call to lab- per Roselyn Reef, 10 more minutes for urine results

## 2015-10-10 NOTE — ED Triage Notes (Signed)
Started having chest pain a couple of days ago, feeling dizzy and weak, having nausea.  My head has been pounding for the past several days.

## 2015-10-10 NOTE — Discharge Instructions (Signed)
If your headache or fever worsens, or you develop neck pain or stiffness, or confusion, return to the ER.  You can take Tylenol and ibuprofen/naproxen for your headache.  Do not take ibuprofen or naproxen for long since you have have ulcers in the past, but for 2-3 days it is ok.  If your chest pain worsens or you have shortness of breath please return to the ER.

## 2015-10-10 NOTE — ED Notes (Signed)
Ginger ale for son who is with pt

## 2015-10-10 NOTE — ED Provider Notes (Signed)
[facility] DEPT Provider Note   CSN: [id number] Arrival date & time: 10/10/15  1940     History   Chief Complaint Chief Complaint  Patient presents with  . Chest Pain    HPI Dalton [name] is a 55 y.o. male with history of CAD (s/p remote CABG), HL, and HTN who presents with 3 days of headache and chest pain.  He first noticed the headache, which is pounding, frontal, and 9/10 in intensity at its worst.  No photo/phonophobia.  He has also had sharp, non-exertional chest pain dissimilar to his prior cardiac pain.  Radiates to both arms, not positional.  Worsening blurry vision over the last couple of days, along with bilateral shoulder pain and weakness. No jaw claudication.  Borderline low-grade fevers at home.  Remote personal history of migraines.  Polyuria without dysuria or hematuria in last few days.  HPI  Past Medical History:  Diagnosis Date  . Anemia   . Arthritis   . Bronchitis, chronic ([place])   . CHF (congestive heart failure) ([place])   . Chronic back pain    [facility] in [place]  . Chronic bronchitis   . Congestive heart failure (CHF) ([place])   . Coronary artery disease   . Depression   . Frequency of urination   . GERD (gastroesophageal reflux disease)   . Headache(784.0)   . High cholesterol   . Hypertension   . Laceration of right hand 11/27/10  . Laceration of wrist 2007 BIL FOREARMS  . MI (myocardial infarction) ([place])    7, last one was in 2011  . PUD (peptic ulcer disease)    in 1990s, secondary to medication  . Seizures ([place])    entire life, last seizure in 2011;unknown etiology-pt sts heriditary  . Shortness of breath    with exertion  . Tonsillitis, chronic    Dr. Vicki Mallet in [place]    Patient Active Problem List   Diagnosis Date Noted  . Cocaine abuse 01/26/2015  . 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 Helicobacter pylori gastritis 05/30/2012  . Pure hypercholesterolemia 08/31/2011  . Essential hypertension,  benign 08/31/2011  . Postsurgical aortocoronary bypass status 08/31/2011  . PAD (peripheral artery disease) ([place]) 08/31/2011  . GERD (gastroesophageal reflux disease) 12/07/2010  . Esophageal dysphagia 12/07/2010  . Constipation 12/07/2010  . Laryngopharyngeal reflux (LPR) 12/07/2010  . Lumbar pain with radiation down left leg 11/17/2010  . CARPAL TUNNEL SYNDROME 07/14/2009  . SHOULDER IMPINGEMENT SYNDROME, LEFT 05/05/2009    Past Surgical History:  Procedure Laterality Date  . BACK SURGERY     3-  . BIOPSY N/A 05/30/2012   Procedure: BIOPSY;  Surgeon: Danie Binder, MD;  Location: [facility];  Service: Endoscopy;  Laterality: N/A;  . CARPAL TUNNEL RELEASE     bilateral  . COLONOSCOPY  12/28/10   SLF: (MAC)Internal hemorrhoids/four small colon polyps  . CORONARY ARTERY BYPASS GRAFT  2002   3 vessels  . ESOPHAGOGASTRODUODENOSCOPY N/A 05/30/2012   SLF: UNCONTROLLED GERD DUE TO LIFESTYLE CHOICE/WEIGHT GAIN/MILD Non-erosive gastritis  . KNEE SURGERY     left-plate to left knee cap from accident  . LEFT HEART CATHETERIZATION WITH CORONARY ANGIOGRAM N/A 08/31/2011   Procedure: LEFT HEART CATHETERIZATION WITH CORONARY ANGIOGRAM;  Surgeon: Laverda Page, MD;  Location: [facility] CATH LAB;  Service: Cardiovascular;  Laterality: N/A;  . SAVORY DILATION  12/28/2010   SLF:(MAC)J-shaped stomach/nodular mocosa in the distal esophagus/empiric dilation 1mm       Home Medications  Prior to Admission medications   Medication Sig Start Date End Date Taking? Authorizing Provider  albuterol (PROAIR HFA) 108 (90 BASE) MCG/ACT inhaler Inhale 2 puffs into the lungs every 6 (six) hours as needed for wheezing (for shortness of breath).    Historical Provider, MD  albuterol (PROVENTIL) (2.5 MG/3ML) 0.083% nebulizer solution Take 3 mLs (2.5 mg total) by nebulization 2 (two) times daily. 01/27/15   Benjamine Mola, FNP  alprazolam Duanne Moron) 2 MG tablet Take 2 mg by mouth 2 (two) times daily.    Historical Provider,  MD  aspirin 325 MG tablet Take 325 mg by mouth daily.    Historical Provider, MD  atorvastatin (LIPITOR) 10 MG tablet Take 1 tablet (10 mg total) by mouth every morning. 01/27/15   Benjamine Mola, FNP  beclomethasone (QVAR) 80 MCG/ACT inhaler Inhale 2 puffs into the lungs 2 (two) times daily. 01/27/15   Benjamine Mola, FNP  cetirizine (ZYRTEC) 10 MG tablet Take 10 mg by mouth daily as needed for allergies or rhinitis. Reported on 04/16/2015    Historical Provider, MD  clopidogrel (PLAVIX) 75 MG tablet Take 75 mg by mouth daily before breakfast. 04/07/15   Historical Provider, MD  diclofenac sodium (VOLTAREN) 1 % GEL Place 1 application onto the skin 4 (four) times daily as needed. 10/08/14   Historical Provider, MD  esomeprazole (NEXIUM) 40 MG capsule Take 1 capsule (40 mg total) by mouth 2 (two) times daily. 01/27/15   Benjamine Mola, FNP  fluticasone (FLONASE) 50 MCG/ACT nasal spray Place 2 sprays into both nostrils daily as needed. 03/17/15   Historical Provider, MD  furosemide (LASIX) 40 MG tablet Take 1 tablet (40 mg total) by mouth daily. 01/27/15   Benjamine Mola, FNP  hydrochlorothiazide (HYDRODIURIL) 25 MG tablet Take 1 tablet (25 mg total) by mouth daily. 01/27/15   Benjamine Mola, FNP  hydrOXYzine (ATARAX/VISTARIL) 25 MG tablet Take 1 tablet (25 mg total) by mouth every 6 (six) hours. 04/16/15   Fransico Meadow, PA-C  ipratropium (ATROVENT) 0.02 % nebulizer solution Take 0.5 mg by nebulization every 6 (six) hours as needed for wheezing or shortness of breath.    Historical Provider, MD  lamoTRIgine (LAMICTAL) 200 MG tablet Take 1 tablet (200 mg total) by mouth daily. 01/27/15   Benjamine Mola, FNP  levETIRAcetam (KEPPRA) 500 MG tablet Take 1 tablet (500 mg total) by mouth every 12 (twelve) hours. 01/27/15   Benjamine Mola, FNP  lisinopril (PRINIVIL,ZESTRIL) 10 MG tablet Take 0.5 tablets (5 mg total) by mouth daily. Take half of a tablet 01/27/15   Benjamine Mola, FNP  metFORMIN (GLUCOPHAGE) 1000 MG  tablet Take 1 tablet (1,000 mg total) by mouth daily with breakfast. 01/27/15   Benjamine Mola, FNP  metoprolol tartrate (LOPRESSOR) 25 MG tablet Take 1 tablet (25 mg total) by mouth 2 (two) times daily. 01/27/15   Benjamine Mola, FNP  potassium chloride SA (K-DUR,KLOR-CON) 20 MEQ tablet Take 20 mEq by mouth 2 (two) times daily.    Historical Provider, MD  pregabalin (LYRICA) 100 MG capsule Take 100-200 mg by mouth 4 (four) times daily - after meals and at bedtime. Takes 1 cap 3 times daily and 2 caps at bedtime 12/20/14   Historical Provider, MD  sertraline (ZOLOFT) 100 MG tablet Take 1 tablet (100 mg total) by mouth daily. 01/27/15   Benjamine Mola, FNP  tamsulosin (FLOMAX) 0.4 MG CAPS capsule Take 1 capsule (0.4 mg total) by  mouth daily after breakfast. 01/27/15   Benjamine Mola, FNP    Family History Family History  Problem Relation Age of Onset  . Diabetes Mother   . Hypertension Mother   . Heart attack Mother   . Hypertension Father   . Diabetes Father   . Heart attack Father   . Heart attack      mother, father, brother, sister all deceased due to MI  . Heart attack Sister   . Heart attack Brother   . Seizures Brother   . Heart failure Other   . Colon cancer Neg Hx   . Liver disease Neg Hx   . Anesthesia problems Neg Hx   . Hypotension Neg Hx   . Malignant hyperthermia Neg Hx   . Pseudochol deficiency Neg Hx   . Colon polyps Neg Hx     Social History Social History  Substance Use Topics  . Smoking status: Never Smoker  . Smokeless tobacco: Never Used     Comment: Never Smoked  . Alcohol use No     Allergies   Gabapentin; Ibuprofen; Zolpidem tartrate; and Naproxen   Review of Systems Review of Systems  Constitutional: Positive for fatigue and fever.  HENT: Negative for congestion, hearing loss and trouble swallowing.   Eyes: Negative for photophobia, pain and visual disturbance.       Worsening blurry vision  Respiratory: Positive for shortness of breath.  Negative for cough, choking and chest tightness.   Cardiovascular: Positive for chest pain and leg swelling. Negative for palpitations.  Gastrointestinal: Positive for diarrhea. Negative for abdominal distention, abdominal pain, blood in stool, nausea and vomiting.  Endocrine: Positive for polyuria. Negative for polyphagia.  Genitourinary: Positive for frequency. Negative for difficulty urinating and hematuria.  Musculoskeletal: Positive for neck pain and neck stiffness.       Bilateral shoulder pain, stiffness  Neurological: Positive for headaches. Negative for numbness.       Bilateral arm weakness     Physical Exam Updated Vital Signs BP 122/74 (BP Location: Left Arm)   Pulse 96   Temp 101.8 F (38.8 C) (Oral)   Resp 18   Ht 5\' 10"  (1.778 m)   Wt 106.6 kg   SpO2 96%   BMI 33.72 kg/m   Physical Exam  Constitutional: He is oriented to person, place, and time.  HENT:  Head: Normocephalic and atraumatic.  Mouth/Throat: Oropharynx is clear and moist. No oropharyngeal exudate.  Tenderness to palpation over R temporal scalp  Eyes:  Pupils sluggish and minimally reactive OU. Anisocoria (R 4 mm, L 1 mm) in both in light and dark. Moderate conjunctival injection OU  Neck: Normal range of motion. Neck supple.  No meningismus  Cardiovascular: Normal rate, regular rhythm, normal heart sounds and intact distal pulses.   No murmur heard. Pulmonary/Chest: Effort normal and breath sounds normal. No respiratory distress. He has no wheezes.  Abdominal: Soft. He exhibits no distension. There is no tenderness.  Genitourinary: Prostate normal.  Musculoskeletal: He exhibits no edema.  Tenderness to palpation over bilateral shoulders  Neurological: He is alert and oriented to person, place, and time. No cranial nerve deficit.  Strength 5/5 symmetric in UEs and LEs  Skin: Skin is warm and dry. No rash noted.  Psychiatric: He has a normal mood and affect. His behavior is normal.     ED  Treatments / Results  Labs (all labs ordered are listed, but only abnormal results are displayed) Labs Reviewed  BASIC METABOLIC PANEL -  Abnormal; Notable for the following:       Result Value   Sodium 134 (*)    Potassium 2.9 (*)    Calcium 8.6 (*)    Anion gap 3 (*)    All other components within normal limits  CBC - Abnormal; Notable for the following:    HCT 37.7 (*)    All other components within normal limits  SEDIMENTATION RATE  URINALYSIS, ROUTINE W REFLEX MICROSCOPIC (NOT AT [facility])  I-STAT TROPOININ, ED    EKG  EKG Interpretation None       Radiology No results found.  Procedures Procedures (including critical care time)  Medications Ordered in ED Medications  nitroGLYCERIN (NITROSTAT) SL tablet 0.4 mg (not administered)  diphenhydrAMINE (BENADRYL) injection 25 mg (not administered)  acetaminophen (TYLENOL) tablet 1,000 mg (not administered)  0.9 %  sodium chloride infusion (not administered)     Initial Impression / Assessment and Plan / ED Course  I have reviewed the triage vital signs and the nursing notes.  Pertinent labs & imaging results that were available during my care of the patient were reviewed by me and considered in my medical decision making (see chart for details).  Patient with multisystemic complaints including headache, chest pain, shoulder and scalp pain, visual change, and dysuria.  Wide   His chest pain and history of CAD are concerning for ACS, though his pain is atypical (non-exertional, sharp, no dyspnea).  His headache, fever, and shoulder pain are concerning for PMR/GCA.  Headache could represent migraine, though with his fever meningitis is also a remote consideration.  Alternatively, his diffuse pain/tenderness and headache could be secondary to his fever.  With his polyuria, UTI or prostatitis could be source for infection.  Clinical Course  Comment By Time  Troponin undetectable, EKG with no acute ischemic changes. Minus Liberty, MD 08/18 2101  Headache continuing after tylenol and benadryl.  Add toradol. Minus Liberty, MD 08/18 2135  UA clean and no prostatic tenderness to suggest GU source for fever. Minus Liberty, MD 08/18 2234     Final Clinical Impressions(s) / ED Diagnoses   Final diagnoses:  Fever, unspecified fever cause  Atypical chest pain  Polyuria   Patient is stable and symptoms improved.  Discussed option of performing LP with possibility of meningitis with headache and fever.  With stable, 3 day course, no menigismus, no encephalopathy, and response to headache cocktail, suspcion of acute bacterial meningitis is low and will defer LP.  Patient stay with son close to hospital with good access to care, and understands importance of returning to ED if condition changes.  New Prescriptions New Prescriptions   No medications on file     Minus Liberty, MD 10/10/15 2252    Elnora Morrison, MD 10/18/15 [phone]

## 2015-10-10 NOTE — ED Notes (Signed)
Dr Inda Castle at the bedside

## 2015-10-10 NOTE — ED Notes (Signed)
Pt reports 3 day history of intermittent CP, fever and SOB- He is on disabillity for CHF- his PCP is in [place]. He also states he has lost 40 pounds without trying recently

## 2015-10-10 NOTE — ED Notes (Signed)
Repeated EKG, Dr Jeanell Sparrow in to reassess and speak with pt- pt to be discharged

## 2015-10-10 NOTE — ED Notes (Signed)
Dr Inda Castle in to reassess

## 2015-10-13 LAB — URINE CULTURE: Culture: NO GROWTH

## 2015-10-30 DIAGNOSIS — M961 Postlaminectomy syndrome, not elsewhere classified: Secondary | ICD-10-CM | POA: Insufficient documentation

## 2015-11-12 ENCOUNTER — Observation Stay (HOSPITAL_COMMUNITY)
Admission: EM | Admit: 2015-11-12 | Discharge: 2015-11-20 | Disposition: A | Discharge status: Home / Self Care | Payer: Medicare Other | Attending: Internal Medicine | Admitting: Internal Medicine

## 2015-11-12 ENCOUNTER — Encounter (HOSPITAL_COMMUNITY): Payer: Self-pay | Admitting: *Deleted

## 2015-11-12 ENCOUNTER — Emergency Department (HOSPITAL_COMMUNITY): Payer: Medicare Other

## 2015-11-12 DIAGNOSIS — I2510 Atherosclerotic heart disease of native coronary artery without angina pectoris: Secondary | ICD-10-CM | POA: Diagnosis not present

## 2015-11-12 DIAGNOSIS — I252 Old myocardial infarction: Secondary | ICD-10-CM | POA: Diagnosis not present

## 2015-11-12 DIAGNOSIS — I110 Hypertensive heart disease with heart failure: Secondary | ICD-10-CM | POA: Diagnosis not present

## 2015-11-12 DIAGNOSIS — I509 Heart failure, unspecified: Secondary | ICD-10-CM | POA: Insufficient documentation

## 2015-11-12 DIAGNOSIS — R509 Fever, unspecified: Secondary | ICD-10-CM | POA: Diagnosis not present

## 2015-11-12 DIAGNOSIS — Z87898 Personal history of other specified conditions: Secondary | ICD-10-CM

## 2015-11-12 DIAGNOSIS — R45851 Suicidal ideations: Secondary | ICD-10-CM | POA: Insufficient documentation

## 2015-11-12 DIAGNOSIS — F329 Major depressive disorder, single episode, unspecified: Secondary | ICD-10-CM | POA: Diagnosis not present

## 2015-11-12 DIAGNOSIS — M1990 Unspecified osteoarthritis, unspecified site: Secondary | ICD-10-CM | POA: Diagnosis not present

## 2015-11-12 DIAGNOSIS — F1414 Cocaine abuse with cocaine-induced mood disorder: Secondary | ICD-10-CM | POA: Diagnosis not present

## 2015-11-12 DIAGNOSIS — Z8711 Personal history of peptic ulcer disease: Secondary | ICD-10-CM | POA: Insufficient documentation

## 2015-11-12 DIAGNOSIS — R569 Unspecified convulsions: Secondary | ICD-10-CM | POA: Insufficient documentation

## 2015-11-12 DIAGNOSIS — Z886 Allergy status to analgesic agent status: Secondary | ICD-10-CM | POA: Diagnosis not present

## 2015-11-12 DIAGNOSIS — K219 Gastro-esophageal reflux disease without esophagitis: Secondary | ICD-10-CM | POA: Diagnosis not present

## 2015-11-12 DIAGNOSIS — E7800 Pure hypercholesterolemia, unspecified: Secondary | ICD-10-CM | POA: Diagnosis not present

## 2015-11-12 DIAGNOSIS — F1494 Cocaine use, unspecified with cocaine-induced mood disorder: Secondary | ICD-10-CM | POA: Diagnosis present

## 2015-11-12 DIAGNOSIS — F1410 Cocaine abuse, uncomplicated: Secondary | ICD-10-CM | POA: Diagnosis not present

## 2015-11-12 DIAGNOSIS — Z7902 Long term (current) use of antithrombotics/antiplatelets: Secondary | ICD-10-CM | POA: Insufficient documentation

## 2015-11-12 DIAGNOSIS — D649 Anemia, unspecified: Secondary | ICD-10-CM | POA: Insufficient documentation

## 2015-11-12 DIAGNOSIS — R0602 Shortness of breath: Secondary | ICD-10-CM | POA: Diagnosis not present

## 2015-11-12 DIAGNOSIS — Z7984 Long term (current) use of oral hypoglycemic drugs: Secondary | ICD-10-CM | POA: Diagnosis not present

## 2015-11-12 DIAGNOSIS — Z888 Allergy status to other drugs, medicaments and biological substances status: Secondary | ICD-10-CM | POA: Insufficient documentation

## 2015-11-12 DIAGNOSIS — R000 Tachycardia, unspecified: Secondary | ICD-10-CM | POA: Diagnosis not present

## 2015-11-12 DIAGNOSIS — E1159 Type 2 diabetes mellitus with other circulatory complications: Secondary | ICD-10-CM | POA: Diagnosis present

## 2015-11-12 DIAGNOSIS — N179 Acute kidney failure, unspecified: Secondary | ICD-10-CM | POA: Insufficient documentation

## 2015-11-12 DIAGNOSIS — E1151 Type 2 diabetes mellitus with diabetic peripheral angiopathy without gangrene: Secondary | ICD-10-CM | POA: Insufficient documentation

## 2015-11-12 DIAGNOSIS — I309 Acute pericarditis, unspecified: Secondary | ICD-10-CM

## 2015-11-12 DIAGNOSIS — Z7982 Long term (current) use of aspirin: Secondary | ICD-10-CM | POA: Diagnosis not present

## 2015-11-12 DIAGNOSIS — R079 Chest pain, unspecified: Principal | ICD-10-CM | POA: Diagnosis present

## 2015-11-12 DIAGNOSIS — I088 Other rheumatic multiple valve diseases: Secondary | ICD-10-CM | POA: Insufficient documentation

## 2015-11-12 DIAGNOSIS — Z951 Presence of aortocoronary bypass graft: Secondary | ICD-10-CM | POA: Diagnosis not present

## 2015-11-12 DIAGNOSIS — I10 Essential (primary) hypertension: Secondary | ICD-10-CM | POA: Diagnosis not present

## 2015-11-12 DIAGNOSIS — E785 Hyperlipidemia, unspecified: Secondary | ICD-10-CM

## 2015-11-12 DIAGNOSIS — R0789 Other chest pain: Secondary | ICD-10-CM

## 2015-11-12 DIAGNOSIS — Z8249 Family history of ischemic heart disease and other diseases of the circulatory system: Secondary | ICD-10-CM | POA: Insufficient documentation

## 2015-11-12 LAB — CBC WITH DIFFERENTIAL/PLATELET
BASOS ABS: 0 10*3/uL (ref 0.0–0.1)
BASOS PCT: 0 %
EOS ABS: 0 10*3/uL (ref 0.0–0.7)
Eosinophils Relative: 0 %
HEMATOCRIT: 37.2 % — AB (ref 39.0–52.0)
HEMOGLOBIN: 12.4 g/dL — AB (ref 13.0–17.0)
Lymphocytes Relative: 13 %
Lymphs Abs: 1.5 10*3/uL (ref 0.7–4.0)
MCH: 29 pg (ref 26.0–34.0)
MCHC: 33.3 g/dL (ref 30.0–36.0)
MCV: 87.1 fL (ref 78.0–100.0)
MONO ABS: 0.5 10*3/uL (ref 0.1–1.0)
MONOS PCT: 5 %
NEUTROS ABS: 8.8 10*3/uL — AB (ref 1.7–7.7)
NEUTROS PCT: 82 %
Platelets: 197 10*3/uL (ref 150–400)
RBC: 4.27 MIL/uL (ref 4.22–5.81)
RDW: 15 % (ref 11.5–15.5)
WBC: 10.8 10*3/uL — ABNORMAL HIGH (ref 4.0–10.5)

## 2015-11-12 LAB — RAPID URINE DRUG SCREEN, HOSP PERFORMED
Amphetamines: NOT DETECTED
Barbiturates: NOT DETECTED
Benzodiazepines: NOT DETECTED
Cocaine: POSITIVE — AB
OPIATES: POSITIVE — AB
Tetrahydrocannabinol: NOT DETECTED

## 2015-11-12 LAB — BASIC METABOLIC PANEL
ANION GAP: 10 (ref 5–15)
BUN: 14 mg/dL (ref 6–20)
CALCIUM: 10.2 mg/dL (ref 8.9–10.3)
CO2: 25 mmol/L (ref 22–32)
CREATININE: 1.46 mg/dL — AB (ref 0.61–1.24)
Chloride: 110 mmol/L (ref 101–111)
GFR calc non Af Amer: 53 mL/min — ABNORMAL LOW (ref >60)
Glucose, Bld: 109 mg/dL — ABNORMAL HIGH (ref 65–99)
Potassium: 3.8 mmol/L (ref 3.5–5.1)
SODIUM: 145 mmol/L (ref 135–145)

## 2015-11-12 LAB — I-STAT TROPONIN, ED: TROPONIN I, POC: 0.03 ng/mL (ref 0.00–0.08)

## 2015-11-12 MED ORDER — PANTOPRAZOLE SODIUM 40 MG PO TBEC
40.0000 mg | DELAYED_RELEASE_TABLET | Freq: Every day | ORAL | Status: DC
  Administered 2015-11-13 – 2015-11-20 (×8): 40 mg via ORAL
  Filled 2015-11-12 (×8): qty 1

## 2015-11-12 MED ORDER — SERTRALINE HCL 50 MG PO TABS
100.0000 mg | ORAL_TABLET | Freq: Every day | ORAL | Status: DC
  Administered 2015-11-13 – 2015-11-20 (×8): 100 mg via ORAL
  Filled 2015-11-12: qty 4
  Filled 2015-11-12 (×7): qty 2

## 2015-11-12 MED ORDER — FLUTICASONE PROPIONATE 50 MCG/ACT NA SUSP: 2.0000 | Freq: Every day | NASAL | Status: DC | PRN

## 2015-11-12 MED ORDER — MORPHINE SULFATE (PF) 4 MG/ML IV SOLN
4.0000 mg | Freq: Once | INTRAVENOUS | Status: AC
  Administered 2015-11-12: 4 mg via INTRAVENOUS
  Filled 2015-11-12: qty 1

## 2015-11-12 MED ORDER — BUDESONIDE 0.5 MG/2ML IN SUSP
0.5000 mg | Freq: Two times a day (BID) | RESPIRATORY_TRACT | Status: DC
  Administered 2015-11-13 – 2015-11-20 (×16): 0.5 mg via RESPIRATORY_TRACT
  Filled 2015-11-12 (×16): qty 2

## 2015-11-12 MED ORDER — ONDANSETRON HCL 4 MG PO TABS: 4.0000 mg | ORAL_TABLET | Freq: Four times a day (QID) | ORAL | Status: DC | PRN

## 2015-11-12 MED ORDER — METOPROLOL TARTRATE 25 MG PO TABS
25.0000 mg | ORAL_TABLET | Freq: Two times a day (BID) | ORAL | Status: DC
  Administered 2015-11-12 – 2015-11-16 (×9): 25 mg via ORAL
  Filled 2015-11-12 (×9): qty 1

## 2015-11-12 MED ORDER — OXYCODONE-ACETAMINOPHEN 5-325 MG PO TABS
1.0000 | ORAL_TABLET | Freq: Four times a day (QID) | ORAL | Status: DC | PRN
  Administered 2015-11-12 – 2015-11-20 (×10): 1 via ORAL
  Filled 2015-11-12 (×10): qty 1

## 2015-11-12 MED ORDER — ALBUTEROL SULFATE (2.5 MG/3ML) 0.083% IN NEBU
2.5000 mg | INHALATION_SOLUTION | Freq: Three times a day (TID) | RESPIRATORY_TRACT | Status: DC
  Administered 2015-11-13 – 2015-11-15 (×8): 2.5 mg via RESPIRATORY_TRACT
  Filled 2015-11-12 (×8): qty 3

## 2015-11-12 MED ORDER — OXYCODONE HCL 5 MG PO TABS
5.0000 mg | ORAL_TABLET | Freq: Four times a day (QID) | ORAL | Status: DC | PRN
  Administered 2015-11-17: 5 mg via ORAL
  Filled 2015-11-12: qty 1

## 2015-11-12 MED ORDER — LORATADINE 10 MG PO TABS
10.0000 mg | ORAL_TABLET | Freq: Every day | ORAL | Status: DC
  Administered 2015-11-13 – 2015-11-20 (×8): 10 mg via ORAL
  Filled 2015-11-12 (×8): qty 1

## 2015-11-12 MED ORDER — ALBUTEROL SULFATE (2.5 MG/3ML) 0.083% IN NEBU: 2.5000 mg | INHALATION_SOLUTION | Freq: Four times a day (QID) | RESPIRATORY_TRACT | Status: DC | PRN

## 2015-11-12 MED ORDER — LISINOPRIL 10 MG PO TABS
5.0000 mg | ORAL_TABLET | Freq: Every day | ORAL | Status: DC
  Administered 2015-11-13 – 2015-11-20 (×8): 5 mg via ORAL
  Filled 2015-11-12 (×8): qty 1

## 2015-11-12 MED ORDER — ACETAMINOPHEN 650 MG RE SUPP: 650.0000 mg | Freq: Four times a day (QID) | RECTAL | Status: DC | PRN

## 2015-11-12 MED ORDER — ACETAMINOPHEN 325 MG PO TABS: 650.0000 mg | ORAL_TABLET | Freq: Four times a day (QID) | ORAL | Status: DC | PRN

## 2015-11-12 MED ORDER — ATORVASTATIN CALCIUM 10 MG PO TABS
10.0000 mg | ORAL_TABLET | Freq: Every morning | ORAL | Status: DC
  Administered 2015-11-13 – 2015-11-20 (×8): 10 mg via ORAL
  Filled 2015-11-12 (×8): qty 1

## 2015-11-12 MED ORDER — ONDANSETRON HCL 4 MG/2ML IJ SOLN: 4.0000 mg | Freq: Four times a day (QID) | INTRAMUSCULAR | Status: DC | PRN

## 2015-11-12 MED ORDER — SODIUM CHLORIDE 0.9 % IV SOLN
INTRAVENOUS | Status: AC
  Administered 2015-11-12 – 2015-11-13 (×2): via INTRAVENOUS

## 2015-11-12 MED ORDER — INSULIN ASPART 100 UNIT/ML ~~LOC~~ SOLN
0.0000 [IU] | Freq: Three times a day (TID) | SUBCUTANEOUS | Status: DC
  Administered 2015-11-13 – 2015-11-16 (×2): 1 [IU] via SUBCUTANEOUS

## 2015-11-12 MED ORDER — LEVETIRACETAM 500 MG PO TABS
500.0000 mg | ORAL_TABLET | Freq: Two times a day (BID) | ORAL | Status: DC
  Administered 2015-11-12 – 2015-11-20 (×16): 500 mg via ORAL
  Filled 2015-11-12 (×16): qty 1

## 2015-11-12 MED ORDER — ALPRAZOLAM 0.5 MG PO TABS
2.0000 mg | ORAL_TABLET | Freq: Two times a day (BID) | ORAL | Status: DC
  Administered 2015-11-12 – 2015-11-20 (×16): 2 mg via ORAL
  Filled 2015-11-12 (×16): qty 4

## 2015-11-12 MED ORDER — CLOPIDOGREL BISULFATE 75 MG PO TABS
75.0000 mg | ORAL_TABLET | Freq: Every day | ORAL | Status: DC
  Administered 2015-11-13 – 2015-11-20 (×8): 75 mg via ORAL
  Filled 2015-11-12 (×8): qty 1

## 2015-11-12 MED ORDER — IPRATROPIUM BROMIDE 0.02 % IN SOLN: 0.5000 mg | Freq: Four times a day (QID) | RESPIRATORY_TRACT | Status: DC | PRN

## 2015-11-12 MED ORDER — PREGABALIN 50 MG PO CAPS: 100.0000 mg | ORAL_CAPSULE | Freq: Three times a day (TID) | ORAL | Status: DC | PRN

## 2015-11-12 MED ORDER — ASPIRIN 325 MG PO TABS
325.0000 mg | ORAL_TABLET | Freq: Every day | ORAL | Status: DC
  Administered 2015-11-13 – 2015-11-20 (×8): 325 mg via ORAL
  Filled 2015-11-12 (×8): qty 1

## 2015-11-12 MED ORDER — COLCHICINE 0.6 MG PO TABS
0.6000 mg | ORAL_TABLET | Freq: Two times a day (BID) | ORAL | Status: DC
  Administered 2015-11-12 – 2015-11-16 (×8): 0.6 mg via ORAL
  Filled 2015-11-12 (×8): qty 1

## 2015-11-12 MED ORDER — HYDROXYZINE HCL 25 MG PO TABS
25.0000 mg | ORAL_TABLET | Freq: Four times a day (QID) | ORAL | Status: DC
  Administered 2015-11-12 – 2015-11-20 (×30): 25 mg via ORAL
  Filled 2015-11-12 (×29): qty 1

## 2015-11-12 MED ORDER — TAMSULOSIN HCL 0.4 MG PO CAPS
0.4000 mg | ORAL_CAPSULE | Freq: Every day | ORAL | Status: DC
  Administered 2015-11-13 – 2015-11-20 (×8): 0.4 mg via ORAL
  Filled 2015-11-12 (×8): qty 1

## 2015-11-12 MED ORDER — LAMOTRIGINE 100 MG PO TABS
200.0000 mg | ORAL_TABLET | Freq: Every day | ORAL | Status: DC
  Administered 2015-11-13 – 2015-11-20 (×8): 200 mg via ORAL
  Filled 2015-11-12 (×8): qty 2

## 2015-11-12 MED ORDER — OXYCODONE-ACETAMINOPHEN 10-325 MG PO TABS: 1.0000 | ORAL_TABLET | Freq: Four times a day (QID) | ORAL | Status: DC | PRN

## 2015-11-12 MED ORDER — IBUPROFEN 200 MG PO TABS
200.0000 mg | ORAL_TABLET | Freq: Three times a day (TID) | ORAL | Status: DC
  Administered 2015-11-13: 200 mg via ORAL
  Filled 2015-11-12 (×6): qty 1

## 2015-11-12 NOTE — ED Triage Notes (Signed)
After assessment PT reported  He walked to [facility] to but an AX to kill himself. Pt reports He has been sick for a long time and just wants to stop feeling ad.

## 2015-11-12 NOTE — ED Notes (Signed)
Dr. Kakrakandy at bedside. 

## 2015-11-12 NOTE — ED Notes (Signed)
Attempted report 

## 2015-11-12 NOTE — Consult Note (Signed)
Patient ID: Dalton [name] MRN: [id number], DOB/AGE: [birth year]   Admit date: 11/12/2015   Primary Physician: Philis Fendt, MD Primary Cardiologist: previously Dr Einar Gip, now new patient to [place]  Pt. Profile:  CABG in 2002, new chest pain  Problem List  Past Medical History:  Diagnosis Date  . Anemia   . Arthritis   . Bronchitis, chronic ([place])   . CHF (congestive heart failure) ([place])   . Chronic back pain    Pain Clinic in [place]  . Chronic bronchitis   . Congestive heart failure (CHF) ([place])   . Coronary artery disease   . Depression   . Frequency of urination   . GERD (gastroesophageal reflux disease)   . Headache(784.0)   . High cholesterol   . Hypertension   . Laceration of right hand 11/27/10  . Laceration of wrist 2007 BIL FOREARMS  . MI (myocardial infarction) ([place])    7, last one was in 2011  . PUD (peptic ulcer disease)    in 1990s, secondary to medication  . Seizures ([place])    entire life, last seizure in 2011;unknown etiology-pt sts heriditary  . Shortness of breath    with exertion  . Tonsillitis, chronic    Dr. Vicki Mallet in [place]    Past Surgical History:  Procedure Laterality Date  . BACK SURGERY     3-  . BIOPSY N/A 05/30/2012   Procedure: BIOPSY;  Surgeon: Danie Binder, MD;  Location: [facility];  Service: Endoscopy;  Laterality: N/A;  . CARPAL TUNNEL RELEASE     bilateral  . COLONOSCOPY  12/28/10   SLF: (MAC)Internal hemorrhoids/four small colon polyps  . CORONARY ARTERY BYPASS GRAFT  2002   3 vessels  . ESOPHAGOGASTRODUODENOSCOPY N/A 05/30/2012   SLF: UNCONTROLLED GERD DUE TO LIFESTYLE CHOICE/WEIGHT GAIN/MILD Non-erosive gastritis  . KNEE SURGERY     left-plate to left knee cap from accident  . LEFT HEART CATHETERIZATION WITH CORONARY ANGIOGRAM N/A 08/31/2011   Procedure: LEFT HEART CATHETERIZATION WITH CORONARY ANGIOGRAM;  Surgeon: Laverda Page, MD;  Location: [facility] CATH LAB;  Service: Cardiovascular;  Laterality: N/A;  .  SAVORY DILATION  12/28/2010   SLF:(MAC)J-shaped stomach/nodular mocosa in the distal esophagus/empiric dilation 69mm    Allergies  Allergies  Allergen Reactions  . Gabapentin Hives  . Ibuprofen Other (See Comments)    REACTION:Stomach  upset  . Zolpidem Tartrate Other (See Comments)    REACTION: Hallucinations   . Naproxen Other (See Comments)    REACTION: unknown   HPI  Patient is a 55 y.o. male with a PMHx of polysubstance abuse, frequent cocaine use, the last admission sec to cocaine use in 01/2015,  H/o CAD, s/p CABG with LIMA to LAD, but coronary angiography in 2013 had revealed occluded LIMA to LAD, native LAD had a 30% stenosis. He has a left subclavian artery stent that was widely patent. He used to follow with Dr. Einar Gip in the past but he states that he got in fight with him and doesn't want him to be his cardiologist anymore. He had an appointment scheduled with Dr. Daneen Schick in June 2017 but he didn't show up for his appointment. When reviewing his records seems that he comes to the ER frequently after he uses cocaine. It is unclear from oversedation with him if he is fully compliant with his medical regimen. The patient states that about a month ago he was having high fevers at 1-2 different ERs and was told that  this might be related to a tick bite or potentially meningitis but no further workup was performed. In the last 2 weeks he has noticed 2 different kind of chest pain one is exertional and feels like significant heaviness on his chest associated with shortness of breath. The other pain is positional and worse when he lays in bed he is unable to sleep, it improves when he sits up and again is worse when he moves or takes a deep breath. He is currently afebrile and chest pain-free. He denies any lower extremity edema no palpitations or recent syncope.  He states that his family history is significantly positive for premature coronary artery disease, his father and 4 uncles  died suddenly of myocardial infarction so did his sister and his 44s and his brother in his 64s.  Home Medications  Prior to Admission medications   Medication Sig Start Date End Date Taking? Authorizing Provider  albuterol (PROAIR HFA) 108 (90 BASE) MCG/ACT inhaler Inhale 2 puffs into the lungs every 6 (six) hours as needed for wheezing (for shortness of breath).    Historical Provider, MD  albuterol (PROVENTIL) (2.5 MG/3ML) 0.083% nebulizer solution Take 3 mLs (2.5 mg total) by nebulization 2 (two) times daily. 01/27/15   Benjamine Mola, FNP  alprazolam Duanne Moron) 2 MG tablet Take 2 mg by mouth 2 (two) times daily.    Historical Provider, MD  aspirin 325 MG tablet Take 325 mg by mouth daily.    Historical Provider, MD  atorvastatin (LIPITOR) 10 MG tablet Take 1 tablet (10 mg total) by mouth every morning. 01/27/15   Benjamine Mola, FNP  beclomethasone (QVAR) 80 MCG/ACT inhaler Inhale 2 puffs into the lungs 2 (two) times daily. 01/27/15   Benjamine Mola, FNP  cetirizine (ZYRTEC) 10 MG tablet Take 10 mg by mouth daily as needed for allergies or rhinitis. Reported on 04/16/2015    Historical Provider, MD  clopidogrel (PLAVIX) 75 MG tablet Take 75 mg by mouth daily before breakfast. 04/07/15   Historical Provider, MD  diclofenac sodium (VOLTAREN) 1 % GEL Place 1 application onto the skin 4 (four) times daily as needed. 10/08/14   Historical Provider, MD  esomeprazole (NEXIUM) 40 MG capsule Take 1 capsule (40 mg total) by mouth 2 (two) times daily. 01/27/15   Benjamine Mola, FNP  fluticasone (FLONASE) 50 MCG/ACT nasal spray Place 2 sprays into both nostrils daily as needed. 03/17/15   Historical Provider, MD  furosemide (LASIX) 40 MG tablet Take 1 tablet (40 mg total) by mouth daily. 01/27/15   Benjamine Mola, FNP  hydrochlorothiazide (HYDRODIURIL) 25 MG tablet Take 1 tablet (25 mg total) by mouth daily. 01/27/15   Benjamine Mola, FNP  hydrOXYzine (ATARAX/VISTARIL) 25 MG tablet Take 1 tablet (25 mg total) by  mouth every 6 (six) hours. 04/16/15   Fransico Meadow, PA-C  ipratropium (ATROVENT) 0.02 % nebulizer solution Take 0.5 mg by nebulization every 6 (six) hours as needed for wheezing or shortness of breath.    Historical Provider, MD  lamoTRIgine (LAMICTAL) 200 MG tablet Take 1 tablet (200 mg total) by mouth daily. 01/27/15   Benjamine Mola, FNP  levETIRAcetam (KEPPRA) 500 MG tablet Take 1 tablet (500 mg total) by mouth every 12 (twelve) hours. 01/27/15   Benjamine Mola, FNP  lisinopril (PRINIVIL,ZESTRIL) 10 MG tablet Take 0.5 tablets (5 mg total) by mouth daily. Take half of a tablet 01/27/15   Benjamine Mola, FNP  metFORMIN (GLUCOPHAGE) 1000 MG  tablet Take 1 tablet (1,000 mg total) by mouth daily with breakfast. 01/27/15   Benjamine Mola, FNP  metoprolol tartrate (LOPRESSOR) 25 MG tablet Take 1 tablet (25 mg total) by mouth 2 (two) times daily. 01/27/15   Benjamine Mola, FNP  potassium chloride SA (K-DUR,KLOR-CON) 20 MEQ tablet Take 20 mEq by mouth 2 (two) times daily.    Historical Provider, MD  pregabalin (LYRICA) 100 MG capsule Take 100-200 mg by mouth 4 (four) times daily - after meals and at bedtime. Takes 1 cap 3 times daily and 2 caps at bedtime 12/20/14   Historical Provider, MD  sertraline (ZOLOFT) 100 MG tablet Take 1 tablet (100 mg total) by mouth daily. 01/27/15   Benjamine Mola, FNP  tamsulosin (FLOMAX) 0.4 MG CAPS capsule Take 1 capsule (0.4 mg total) by mouth daily after breakfast. 01/27/15   Benjamine Mola, FNP   Family History  Family History  Problem Relation Age of Onset  . Diabetes Mother   . Hypertension Mother   . Heart attack Mother   . Hypertension Father   . Diabetes Father   . Heart attack Father   . Heart attack      mother, father, brother, sister all deceased due to MI  . Heart attack Sister   . Heart attack Brother   . Seizures Brother   . Heart failure Other   . Colon cancer Neg Hx   . Liver disease Neg Hx   . Anesthesia problems Neg Hx   . Hypotension Neg Hx   .  Malignant hyperthermia Neg Hx   . Pseudochol deficiency Neg Hx   . Colon polyps Neg Hx    Social History  Social History   Social History  . Marital status: Single    Spouse name: N/A  . Number of children: 2  . Years of education: N/A   Occupational History  . disabled Not Employed   Social History Main Topics  . Smoking status: Never Smoker  . Smokeless tobacco: Never Used     Comment: Never Smoked  . Alcohol use No  . Drug use: No     Comment: history of cocaine, etoh, marijuana but denies any the last several years-12 yrs ago  . Sexual activity: Not on file   Other Topics Concern  . Not on file   Social History Narrative   Lives w/ son-23/22    Review of Systems General:  No chills, fever, night sweats or weight changes.  Cardiovascular:  No chest pain, dyspnea on exertion, edema, orthopnea, palpitations, paroxysmal nocturnal dyspnea. Dermatological: No rash, lesions/masses Respiratory: No cough, dyspnea Urologic: No hematuria, dysuria Abdominal:   No nausea, vomiting, diarrhea, bright red blood per rectum, melena, or hematemesis Neurologic:  No visual changes, wkns, changes in mental status. All other systems reviewed and are otherwise negative except as noted above.  Physical Exam  Blood pressure 126/76, pulse 104, temperature 99.1 F (37.3 C), temperature source Oral, resp. rate 18, SpO2 98 %.  General: Pleasant, NAD Psych: Normal affect. Neuro: Alert and oriented X 3. Moves all extremities spontaneously. HEENT: Normal  Neck: Supple without bruits or JVD. Lungs:  Resp regular and unlabored, CTA. Heart: RRR no s3, s4, or murmurs. Abdomen: Soft, non-tender, non-distended, BS + x 4.  Extremities: No clubbing, cyanosis or edema. DP/PT/Radials 2+ and equal bilaterally.  Labs  No results for input(s): CKTOTAL, CKMB, TROPONINI in the last 72 hours. Lab Results  Component Value Date   WBC 10.8 (  H) 11/12/2015   HGB 12.4 (L) 11/12/2015   HCT 37.2 (L)  11/12/2015   MCV 87.1 11/12/2015   PLT 197 11/12/2015    Recent Labs Lab 11/12/15 1809  NA 145  K 3.8  CL 110  CO2 25  BUN 14  CREATININE 1.46*  CALCIUM 10.2  GLUCOSE 109*   Lab Results  Component Value Date   CHOL  04/17/2010    185        ATP III CLASSIFICATION:  <200     mg/dL   Desirable  200-239  mg/dL   Borderline High  >=240    mg/dL   High          HDL 44 04/17/2010   LDLCALC (H) 04/17/2010    126        Total Cholesterol/HDL:CHD Risk Coronary Heart Disease Risk Table                     Men   Women  1/2 Average Risk   3.4   3.3  Average Risk       5.0   4.4  2 X Average Risk   9.6   7.1  3 X Average Risk  23.4   11.0        Use the calculated Patient Ratio above and the CHD Risk Table to determine the patient's CHD Risk.        ATP III CLASSIFICATION (LDL):  <100     mg/dL   Optimal  100-129  mg/dL   Near or Above                    Optimal  130-159  mg/dL   Borderline  160-189  mg/dL   High  >190     mg/dL   Very High   TRIG 75 04/17/2010    Radiology/Studies  Dg Chest 2 View  Result Date: 11/12/2015 CLINICAL DATA:  Chest pain and shortness of breath for several weeks. History of hypertension, CHF. EXAM: CHEST  2 VIEW COMPARISON:  Chest radiograph October 17, 2015 FINDINGS: Cardiomediastinal silhouette is normal. Status post median sternotomy for CABG. Fractured most caudal median sternotomy wire. LEFT subclavian stent. No pleural effusions or focal consolidations. Trachea projects midline and there is no pneumothorax. Soft tissue planes and included osseous structures are non-suspicious. IMPRESSION: No acute cardiopulmonary process. Electronically Signed   By: Elon Alas M.D.   On: 11/12/2015 18:48   Echocardiogram - none  ECG: Sinus tachycardia otherwise normal EKG    ASSESSMENT AND PLAN  1. Chest pain wit some typical - exertional symptoms and some atypical symptoms - suspicious for a possible pericarditis, especially in the settings  of a recent febrile illness. I will start empiric treatment for pericarditis with Colchicine and NSAIDS.  I would start with ordering echo evaluating his pericardium, LVEF and wall motion abnormalities. I have also order urine drug screen to rule out cocaine. Considering patient regularly use cocaine and has frequent visits to the ER for that I would not rush into stress testing or cardiac catheterization unless significantly elevated troponin or ongoing exertional chest pain if cocaine is negative.  2. CAD, s/p CABG with LIMA to LAD, cath in 2013 showed occluded LIMA to LAD, native LAD had a 30% stenosis. For now continue aspirin, Plavix, atorvastatin and metoprolol.  3. Essential hypertension - well controlled on current regimen  4. Hyperlipidemia on atorvastatin.  5. Psychiatric problems, psychiatric consult called by ER physician.  Signed, Ena Dawley, MD, [facility] 11/12/2015, 8:46 PM

## 2015-11-12 NOTE — ED Provider Notes (Signed)
[facility] DEPT Provider Note   CSN: [id number] Arrival date & time: 11/12/15  1741     History   Chief Complaint Chief Complaint  Patient presents with  . Chest Pain    HPI Dalton [name] is a 55 y.o. male.  Patient with a history of CABG and MI presents today with chest pain.  He reports that the pain has been occurring intermittently daily over the past month.  He states that the pain worsened today when walking to [place].  Pain located left anterior chest and radiating down the left arm.  He was given 324 ASA and 2 NG SL en route to the ED, which helped somewhat.  However, he is continuing to have pain.  He reports associated diaphoresis and SOB.  He denies nausea, vomiting, dizziness, or syncope.  Denies cough or fever.  He does have a history of Cocaine use, but denies using in the past week.      Past Medical History:  Diagnosis Date  . Anemia   . Arthritis   . Bronchitis, chronic ([place])   . CHF (congestive heart failure) ([place])   . Chronic back pain    Pain Clinic in [place]  . Chronic bronchitis   . Congestive heart failure (CHF) ([place])   . Coronary artery disease   . Depression   . Frequency of urination   . GERD (gastroesophageal reflux disease)   . Headache(784.0)   . High cholesterol   . Hypertension   . Laceration of right hand 11/27/10  . Laceration of wrist 2007 BIL FOREARMS  . MI (myocardial infarction) ([place])    7, last one was in 2011  . PUD (peptic ulcer disease)    in 1990s, secondary to medication  . Seizures ([place])    entire life, last seizure in 2011;unknown etiology-pt sts heriditary  . Shortness of breath    with exertion  . Tonsillitis, chronic    Dr. Vicki Mallet in [place]    Patient Active Problem List   Diagnosis Date Noted  . Cocaine abuse 01/26/2015  . Cocaine-induced mood disorder ([place]) 01/26/2015  . Helicobacter pylori gastritis 05/30/2012  . Pure hypercholesterolemia 08/31/2011  . Essential hypertension, benign  08/31/2011  . Postsurgical aortocoronary bypass status 08/31/2011  . PAD (peripheral artery disease) ([place]) 08/31/2011  . GERD (gastroesophageal reflux disease) 12/07/2010  . Esophageal dysphagia 12/07/2010  . Constipation 12/07/2010  . Laryngopharyngeal reflux (LPR) 12/07/2010  . Lumbar pain with radiation down left leg 11/17/2010  . CARPAL TUNNEL SYNDROME 07/14/2009  . SHOULDER IMPINGEMENT SYNDROME, LEFT 05/05/2009    Past Surgical History:  Procedure Laterality Date  . BACK SURGERY     3-  . BIOPSY N/A 05/30/2012   Procedure: BIOPSY;  Surgeon: Danie Binder, MD;  Location: [facility];  Service: Endoscopy;  Laterality: N/A;  . CARPAL TUNNEL RELEASE     bilateral  . COLONOSCOPY  12/28/10   SLF: (MAC)Internal hemorrhoids/four small colon polyps  . CORONARY ARTERY BYPASS GRAFT  2002   3 vessels  . ESOPHAGOGASTRODUODENOSCOPY N/A 05/30/2012   SLF: UNCONTROLLED GERD DUE TO LIFESTYLE CHOICE/WEIGHT GAIN/MILD Non-erosive gastritis  . KNEE SURGERY     left-plate to left knee cap from accident  . LEFT HEART CATHETERIZATION WITH CORONARY ANGIOGRAM N/A 08/31/2011   Procedure: LEFT HEART CATHETERIZATION WITH CORONARY ANGIOGRAM;  Surgeon: Laverda Page, MD;  Location: [facility] CATH LAB;  Service: Cardiovascular;  Laterality: N/A;  . SAVORY DILATION  12/28/2010   SLF:(MAC)J-shaped stomach/nodular mocosa in the distal  esophagus/empiric dilation 61mm       Home Medications    Prior to Admission medications   Medication Sig Start Date End Date Taking? Authorizing Provider  albuterol (PROAIR HFA) 108 (90 BASE) MCG/ACT inhaler Inhale 2 puffs into the lungs every 6 (six) hours as needed for wheezing (for shortness of breath).    Historical Provider, MD  albuterol (PROVENTIL) (2.5 MG/3ML) 0.083% nebulizer solution Take 3 mLs (2.5 mg total) by nebulization 2 (two) times daily. 01/27/15   Benjamine Mola, FNP  alprazolam Duanne Moron) 2 MG tablet Take 2 mg by mouth 2 (two) times daily.    Historical Provider, MD    aspirin 325 MG tablet Take 325 mg by mouth daily.    Historical Provider, MD  atorvastatin (LIPITOR) 10 MG tablet Take 1 tablet (10 mg total) by mouth every morning. 01/27/15   Benjamine Mola, FNP  beclomethasone (QVAR) 80 MCG/ACT inhaler Inhale 2 puffs into the lungs 2 (two) times daily. 01/27/15   Benjamine Mola, FNP  cetirizine (ZYRTEC) 10 MG tablet Take 10 mg by mouth daily as needed for allergies or rhinitis. Reported on 04/16/2015    Historical Provider, MD  clopidogrel (PLAVIX) 75 MG tablet Take 75 mg by mouth daily before breakfast. 04/07/15   Historical Provider, MD  diclofenac sodium (VOLTAREN) 1 % GEL Place 1 application onto the skin 4 (four) times daily as needed. 10/08/14   Historical Provider, MD  esomeprazole (NEXIUM) 40 MG capsule Take 1 capsule (40 mg total) by mouth 2 (two) times daily. 01/27/15   Benjamine Mola, FNP  fluticasone (FLONASE) 50 MCG/ACT nasal spray Place 2 sprays into both nostrils daily as needed. 03/17/15   Historical Provider, MD  furosemide (LASIX) 40 MG tablet Take 1 tablet (40 mg total) by mouth daily. 01/27/15   Benjamine Mola, FNP  hydrochlorothiazide (HYDRODIURIL) 25 MG tablet Take 1 tablet (25 mg total) by mouth daily. 01/27/15   Benjamine Mola, FNP  hydrOXYzine (ATARAX/VISTARIL) 25 MG tablet Take 1 tablet (25 mg total) by mouth every 6 (six) hours. 04/16/15   Fransico Meadow, PA-C  ipratropium (ATROVENT) 0.02 % nebulizer solution Take 0.5 mg by nebulization every 6 (six) hours as needed for wheezing or shortness of breath.    Historical Provider, MD  lamoTRIgine (LAMICTAL) 200 MG tablet Take 1 tablet (200 mg total) by mouth daily. 01/27/15   Benjamine Mola, FNP  levETIRAcetam (KEPPRA) 500 MG tablet Take 1 tablet (500 mg total) by mouth every 12 (twelve) hours. 01/27/15   Benjamine Mola, FNP  lisinopril (PRINIVIL,ZESTRIL) 10 MG tablet Take 0.5 tablets (5 mg total) by mouth daily. Take half of a tablet 01/27/15   Benjamine Mola, FNP  metFORMIN (GLUCOPHAGE) 1000 MG tablet  Take 1 tablet (1,000 mg total) by mouth daily with breakfast. 01/27/15   Benjamine Mola, FNP  metoprolol tartrate (LOPRESSOR) 25 MG tablet Take 1 tablet (25 mg total) by mouth 2 (two) times daily. 01/27/15   Benjamine Mola, FNP  potassium chloride SA (K-DUR,KLOR-CON) 20 MEQ tablet Take 20 mEq by mouth 2 (two) times daily.    Historical Provider, MD  pregabalin (LYRICA) 100 MG capsule Take 100-200 mg by mouth 4 (four) times daily - after meals and at bedtime. Takes 1 cap 3 times daily and 2 caps at bedtime 12/20/14   Historical Provider, MD  sertraline (ZOLOFT) 100 MG tablet Take 1 tablet (100 mg total) by mouth daily. 01/27/15   Benjamine Mola,  FNP  tamsulosin (FLOMAX) 0.4 MG CAPS capsule Take 1 capsule (0.4 mg total) by mouth daily after breakfast. 01/27/15   Benjamine Mola, FNP    Family History Family History  Problem Relation Age of Onset  . Diabetes Mother   . Hypertension Mother   . Heart attack Mother   . Hypertension Father   . Diabetes Father   . Heart attack Father   . Heart attack      mother, father, brother, sister all deceased due to MI  . Heart attack Sister   . Heart attack Brother   . Seizures Brother   . Heart failure Other   . Colon cancer Neg Hx   . Liver disease Neg Hx   . Anesthesia problems Neg Hx   . Hypotension Neg Hx   . Malignant hyperthermia Neg Hx   . Pseudochol deficiency Neg Hx   . Colon polyps Neg Hx     Social History Social History  Substance Use Topics  . Smoking status: Never Smoker  . Smokeless tobacco: Never Used     Comment: Never Smoked  . Alcohol use No     Allergies   Gabapentin; Ibuprofen; Zolpidem tartrate; and Naproxen   Review of Systems Review of Systems  All other systems reviewed and are negative.    Physical Exam Updated Vital Signs Pulse 104   Temp 99.1 F (37.3 C) (Oral)   Resp 18   SpO2 98%   Physical Exam  Constitutional: He appears well-developed and well-nourished.  HENT:  Head: Normocephalic and  atraumatic.  Neck: Normal range of motion. Neck supple.  Cardiovascular: Normal rate, regular rhythm and normal heart sounds.   Pulses:      Dorsalis pedis pulses are 2+ on the right side, and 2+ on the left side.  Pulmonary/Chest: Effort normal and breath sounds normal.  Musculoskeletal: Normal range of motion.  No LE edema or erythema  Neurological: He is alert.  Skin: Skin is warm and dry.  Psychiatric: He has a normal mood and affect.  Nursing note and vitals reviewed.    ED Treatments / Results  Labs (all labs ordered are listed, but only abnormal results are displayed) Labs Reviewed - No data to display  EKG  EKG Interpretation  Date/Time:  Wednesday November 12 2015 17:46:23 EDT Ventricular Rate:  104 PR Interval:    QRS Duration: 79 QT Interval:  341 QTC Calculation: 449 R Axis:   80 Text Interpretation:  Fast sinus arrhythmia Baseline wander in lead(s) V1 Since previous tracing rate faster, arrythmia more evident Confirmed by [name]  MD, MARTHA [phone]) on 11/12/2015 7:20:15 PM       Radiology No results found.  Procedures Procedures (including critical care time)  Medications Ordered in ED Medications - No data to display   Initial Impression / Assessment and Plan / ED Course  I have reviewed the triage vital signs and the nursing notes.  Pertinent labs & imaging results that were available during my care of the patient were reviewed by me and considered in my medical decision making (see chart for details).  Clinical Course   7:44 PM Discussed with Cardiology.  She reports that she will come see the patient in the ED.  Final Clinical Impressions(s) / ED Diagnoses   Final diagnoses:  None   Patient with a history of CABG and MI presents today with chest pain.  He reports that the pain has been occurring intermittently over the past month, but worsened  today when he was walking to [place].  Pain associated with SOB and diaphoresis.  No ischemic  changes on EKG.  Initial troponin is negative.  He has a history of Cocaine abuse, but denies any recent use.  After evaluation or the chest pain, patient mentioned to RN that he was suicidal and that he was heading to [place] to buy an ax to kill himself with.  Patient did not mention this during my initial evaluation.  Psych consult made.  Cardiology consulted regarding the chest pain and agreed to see the patient in the ED.  Plan is for the patient to be admitted by either Cardiology or hospitalist.     New Prescriptions New Prescriptions   No medications on file     Hyman Bible, PA-C 11/15/15 [place], MD 11/18/15 [phone]

## 2015-11-12 NOTE — H&P (Signed)
History and Physical    TRACEY WITTEMAN [id number] DOB: Jan 06, 1961 DOA: 11/12/2015  PCP: Philis Fendt, MD  Patient coming from: Home.  Chief Complaint: Chest pain.  HPI: Dalton [name] is a 55 y.o. male with CAD status post CABG, diabetes mellitus type 2, history of seizures, previous history of drug abuse presents to the ER because of chest pain. Patient states he has been experiencing subjective feeling of fever chills last one month. He has been to in ER recently and was prescribed doxycycline for possible tick-related disease which patient is yet to start taking presents to the ER because of chest pain. Patient has been experiencing chest pain last 2 weeks which increases on lying down. Denies any associated shortness of breath nausea vomiting diarrhea. The patient is mildly febrile. Chest x-ray and EKG unremarkable. On-call cardiologist was consulted and felt that patient may be having pericarditis and admitted for further observation and management.   ED Course: Patient was started on ibuprofen and colchicine for possible pericarditis.  Review of Systems: As per HPI, rest all negative.   Past Medical History:  Diagnosis Date  . Anemia   . Arthritis   . Bronchitis, chronic ([place])   . CHF (congestive heart failure) ([place])   . Chronic back pain    [facility] in [place]  . Chronic bronchitis   . Congestive heart failure (CHF) ([place])   . Coronary artery disease   . Depression   . Frequency of urination   . GERD (gastroesophageal reflux disease)   . Headache(784.0)   . High cholesterol   . Hypertension   . Laceration of right hand 11/27/10  . Laceration of wrist 2007 BIL FOREARMS  . MI (myocardial infarction) ([place])    7, last one was in 2011  . PUD (peptic ulcer disease)    in 1990s, secondary to medication  . Seizures ([place])    entire life, last seizure in 2011;unknown etiology-pt sts heriditary  . Shortness of breath    with exertion  . Tonsillitis, chronic     Dr. Vicki Mallet in [place]    Past Surgical History:  Procedure Laterality Date  . BACK SURGERY     3-  . BIOPSY N/A 05/30/2012   Procedure: BIOPSY;  Surgeon: Danie Binder, MD;  Location: [facility];  Service: Endoscopy;  Laterality: N/A;  . CARPAL TUNNEL RELEASE     bilateral  . COLONOSCOPY  12/28/10   SLF: (MAC)Internal hemorrhoids/four small colon polyps  . CORONARY ARTERY BYPASS GRAFT  2002   3 vessels  . ESOPHAGOGASTRODUODENOSCOPY N/A 05/30/2012   SLF: UNCONTROLLED GERD DUE TO LIFESTYLE CHOICE/WEIGHT GAIN/MILD Non-erosive gastritis  . KNEE SURGERY     left-plate to left knee cap from accident  . LEFT HEART CATHETERIZATION WITH CORONARY ANGIOGRAM N/A 08/31/2011   Procedure: LEFT HEART CATHETERIZATION WITH CORONARY ANGIOGRAM;  Surgeon: Laverda Page, MD;  Location: [facility] CATH LAB;  Service: Cardiovascular;  Laterality: N/A;  . SAVORY DILATION  12/28/2010   SLF:(MAC)J-shaped stomach/nodular mocosa in the distal esophagus/empiric dilation 75mm     reports that he has never smoked. He has never used smokeless tobacco. He reports that he drinks alcohol. He reports that he does not use drugs.  Allergies  Allergen Reactions  . Gabapentin Hives  . Ibuprofen Other (See Comments)    REACTION:Stomach  upset  . Zolpidem Tartrate Other (See Comments)    REACTION: Hallucinations   . Naproxen Other (See Comments)    REACTION: unknown  Family History  Problem Relation Age of Onset  . Diabetes Mother   . Hypertension Mother   . Heart attack Mother   . Hypertension Father   . Diabetes Father   . Heart attack Father   . Heart attack      mother, father, brother, sister all deceased due to MI  . Heart attack Sister   . Heart attack Brother   . Seizures Brother   . Heart failure Other   . Colon cancer Neg Hx   . Liver disease Neg Hx   . Anesthesia problems Neg Hx   . Hypotension Neg Hx   . Malignant hyperthermia Neg Hx   . Pseudochol deficiency Neg Hx   . Colon polyps Neg Hx       Prior to Admission medications   Medication Sig Start Date End Date Taking? Authorizing Provider  albuterol (PROAIR HFA) 108 (90 BASE) MCG/ACT inhaler Inhale 2 puffs into the lungs every 6 (six) hours as needed for wheezing (for shortness of breath).   Yes Historical Provider, MD  albuterol (PROVENTIL) (2.5 MG/3ML) 0.083% nebulizer solution Take 3 mLs (2.5 mg total) by nebulization 2 (two) times daily. Patient taking differently: Take 2.5 mg by nebulization 3 (three) times daily.  01/27/15  Yes Benjamine Mola, FNP  alprazolam Duanne Moron) 2 MG tablet Take 2 mg by mouth 2 (two) times daily.   Yes Historical Provider, MD  aspirin 325 MG tablet Take 325 mg by mouth daily.   Yes Historical Provider, MD  atorvastatin (LIPITOR) 10 MG tablet Take 1 tablet (10 mg total) by mouth every morning. 01/27/15  Yes Benjamine Mola, FNP  beclomethasone (QVAR) 80 MCG/ACT inhaler Inhale 2 puffs into the lungs 2 (two) times daily. Patient taking differently: Inhale 2 puffs into the lungs 2 (two) times daily as needed.  01/27/15  Yes Benjamine Mola, FNP  cetirizine (ZYRTEC) 10 MG tablet Take 10 mg by mouth daily as needed for allergies or rhinitis. Reported on 04/16/2015   Yes Historical Provider, MD  clopidogrel (PLAVIX) 75 MG tablet Take 75 mg by mouth daily before breakfast. 04/07/15  Yes Historical Provider, MD  diclofenac sodium (VOLTAREN) 1 % GEL Place 1 application onto the skin 4 (four) times daily as needed. 10/08/14  Yes Historical Provider, MD  esomeprazole (NEXIUM) 40 MG capsule Take 1 capsule (40 mg total) by mouth 2 (two) times daily. 01/27/15  Yes Benjamine Mola, FNP  fluticasone (FLONASE) 50 MCG/ACT nasal spray Place 2 sprays into both nostrils daily as needed for allergies.  03/17/15  Yes Historical Provider, MD  furosemide (LASIX) 40 MG tablet Take 1 tablet (40 mg total) by mouth daily. 01/27/15  Yes Benjamine Mola, FNP  hydrochlorothiazide (HYDRODIURIL) 25 MG tablet Take 1 tablet (25 mg total) by mouth daily.  01/27/15  Yes Benjamine Mola, FNP  hydrOXYzine (ATARAX/VISTARIL) 25 MG tablet Take 1 tablet (25 mg total) by mouth every 6 (six) hours. 04/16/15  Yes Hollace Kinnier Sofia, PA-C  ipratropium (ATROVENT) 0.02 % nebulizer solution Take 0.5 mg by nebulization every 6 (six) hours as needed for wheezing or shortness of breath.   Yes Historical Provider, MD  lamoTRIgine (LAMICTAL) 200 MG tablet Take 1 tablet (200 mg total) by mouth daily. 01/27/15  Yes Benjamine Mola, FNP  levETIRAcetam (KEPPRA) 500 MG tablet Take 1 tablet (500 mg total) by mouth every 12 (twelve) hours. 01/27/15  Yes Benjamine Mola, FNP  lisinopril (PRINIVIL,ZESTRIL) 10 MG tablet Take 0.5 tablets (  5 mg total) by mouth daily. Take half of a tablet Patient taking differently: Take 5-10 mg by mouth daily.  01/27/15  Yes Benjamine Mola, FNP  metFORMIN (GLUCOPHAGE) 1000 MG tablet Take 1 tablet (1,000 mg total) by mouth daily with breakfast. 01/27/15  Yes Benjamine Mola, FNP  metoprolol tartrate (LOPRESSOR) 25 MG tablet Take 1 tablet (25 mg total) by mouth 2 (two) times daily. 01/27/15  Yes Benjamine Mola, FNP  oxyCODONE-acetaminophen (PERCOCET) 10-325 MG tablet Take 1 tablet by mouth every 6 (six) hours as needed for pain.   Yes Historical Provider, MD  potassium chloride SA (K-DUR,KLOR-CON) 20 MEQ tablet Take 20 mEq by mouth 2 (two) times daily.   Yes Historical Provider, MD  pregabalin (LYRICA) 100 MG capsule Take 100-200 mg by mouth 3 (three) times daily as needed (for shooting pains in feet).  12/20/14  Yes Historical Provider, MD  sertraline (ZOLOFT) 100 MG tablet Take 1 tablet (100 mg total) by mouth daily. 01/27/15  Yes Benjamine Mola, FNP  tamsulosin (FLOMAX) 0.4 MG CAPS capsule Take 1 capsule (0.4 mg total) by mouth daily after breakfast. 01/27/15  Yes Benjamine Mola, FNP    Physical Exam: Vitals:   11/12/15 1753 11/12/15 1800 11/12/15 2000  BP:  126/76 127/72  Pulse: 104  104  Resp: 18  18  Temp: 99.1 F (37.3 C)    TempSrc: Oral    SpO2:  98%  98%      Constitutional: Not in distress. Vitals:   11/12/15 1753 11/12/15 1800 11/12/15 2000  BP:  126/76 127/72  Pulse: 104  104  Resp: 18  18  Temp: 99.1 F (37.3 C)    TempSrc: Oral    SpO2: 98%  98%   Eyes: Anicteric. No pallor. ENMT: No discharge from the ears eyes nose or mouth. Neck: No JVD appreciated no mass felt. Respiratory: No rhonchi or crepitations. Cardiovascular: S1 and S2 heard. Abdomen: Soft nontender bowel sounds present. No guarding or rigidity. Musculoskeletal: No edema. Skin: No rash. Neurologic: Alert awake oriented to time place and person. Moves all extremities. Psychiatric: Depression.   Labs on Admission: I have personally reviewed following labs and imaging studies  CBC:  Recent Labs Lab 11/12/15 1809  WBC 10.8*  NEUTROABS 8.8*  HGB 12.4*  HCT 37.2*  MCV 87.1  PLT [id number]   Basic Metabolic Panel:  Recent Labs Lab 11/12/15 1809  NA 145  K 3.8  CL 110  CO2 25  GLUCOSE 109*  BUN 14  CREATININE 1.46*  CALCIUM 10.2   GFR: CrCl cannot be calculated (Unknown ideal weight.). Liver Function Tests: No results for input(s): AST, ALT, ALKPHOS, BILITOT, PROT, ALBUMIN in the last 168 hours. No results for input(s): LIPASE, AMYLASE in the last 168 hours. No results for input(s): AMMONIA in the last 168 hours. Coagulation Profile: No results for input(s): INR, PROTIME in the last 168 hours. Cardiac Enzymes: No results for input(s): CKTOTAL, CKMB, CKMBINDEX, TROPONINI in the last 168 hours. BNP (last 3 results) No results for input(s): PROBNP in the last 8760 hours. HbA1C: No results for input(s): HGBA1C in the last 72 hours. CBG: No results for input(s): GLUCAP in the last 168 hours. Lipid Profile: No results for input(s): CHOL, HDL, LDLCALC, TRIG, CHOLHDL, LDLDIRECT in the last 72 hours. Thyroid Function Tests: No results for input(s): TSH, T4TOTAL, FREET4, T3FREE, THYROIDAB in the last 72 hours. Anemia Panel: No results for  input(s): VITAMINB12, FOLATE, FERRITIN, TIBC, IRON, RETICCTPCT in  the last 72 hours. Urine analysis:    Component Value Date/Time   COLORURINE YELLOW 10/10/2015 2140   APPEARANCEUR CLEAR 10/10/2015 2140   LABSPEC <1.005 (L) 10/10/2015 2140   PHURINE 6.0 10/10/2015 2140   GLUCOSEU NEGATIVE 10/10/2015 2140   HGBUR NEGATIVE 10/10/2015 2140   BILIRUBINUR NEGATIVE 10/10/2015 2140   KETONESUR NEGATIVE 10/10/2015 2140   PROTEINUR NEGATIVE 10/10/2015 2140   UROBILINOGEN 1.0 09/25/2011 1513   NITRITE NEGATIVE 10/10/2015 2140   LEUKOCYTESUR NEGATIVE 10/10/2015 2140   Sepsis Labs: @LABRCNTIP (procalcitonin:4,lacticidven:4) )No results found for this or any previous visit (from the past 240 hour(s)).   Radiological Exams on Admission: Dg Chest 2 View  Result Date: 11/12/2015 CLINICAL DATA:  Chest pain and shortness of breath for several weeks. History of hypertension, CHF. EXAM: CHEST  2 VIEW COMPARISON:  Chest radiograph October 17, 2015 FINDINGS: Cardiomediastinal silhouette is normal. Status post median sternotomy for CABG. Fractured most caudal median sternotomy wire. LEFT subclavian stent. No pleural effusions or focal consolidations. Trachea projects midline and there is no pneumothorax. Soft tissue planes and included osseous structures are non-suspicious. IMPRESSION: No acute cardiopulmonary process. Electronically Signed   By: Elon Alas M.D.   On: 11/12/2015 18:48    EKG: Independently reviewed. Normal sinus rhythm.  Assessment/Plan Principal Problem:   Chest pain Active Problems:   Essential hypertension, benign   Type 2 diabetes mellitus with vascular disease ([place])   History of seizure    1. Chest pain - patient'S chest pain increases on lying down concerning for possible pericarditis given that patient also recently had febrile illness. Appreciate cardiology consult and patient is on colchicine and NSAIDs. Check 2-D echo, cycle cardiac markers. Check urine drug screen.  Patient is on statins, metoprolol and aspirin and Plavix. 2. Febrile illness - patient has been experiencing fever and chills for last 1 month. Check blood cultures, RMSF titer and Lyme titer since patient was told he may be having tick born disease. For now I have placed patient on doxycycline. 3. History of seizures on Lamictal and Keppra. Has not had any seizures recently. 4. History of polysubstance abuse - patient states he has not taken cocaine for many months. Advised to quit smoking. 5. Diabetes mellitus type 2 - will keep patient on sliding scale coverage. 6. Anemia - follow CBC. 7. Hypertension - We will hold lisinopril due to mildly elevated creatinine. When necessary IV hydralazine for systolic blood pressure more than 160.  8. Depression - patient had endorsed suicidal ideas on arrival to the ER. Consult psychiatry in a.m.   DVT prophylaxis: SCDs. Code Status: Full code.  Family Communication: Discussed with patient.  Disposition Plan: Home.  Consults called: Cardiology.  Admission status: Observation.    Rise Patience MD [facility] Pager [phone].  If 7PM-7AM, please contact night-coverage [email or web address] Password [email or web address]  11/12/2015, 10:45 PM

## 2015-11-12 NOTE — ED Triage Notes (Signed)
Information provided by EMS . 13  Was called because Pt was having CP while  at [facility] today . Pt reported he had Lt sided CP that radiated into Lt arm. Pt was given 324 of ASA and 2 NGT SL Pt CP went from 8/10 to 6/10 . Pt also reports an ongoing fever. Temp 99.1 when vitals were checked on arrival to room. B/P 134/77, Pulse 113, R 18, O2 % on RA 94%. CBG 110. Pt A/O on arrival to Room.

## 2015-11-13 DIAGNOSIS — R45851 Suicidal ideations: Secondary | ICD-10-CM

## 2015-11-13 DIAGNOSIS — Z79899 Other long term (current) drug therapy: Secondary | ICD-10-CM | POA: Diagnosis not present

## 2015-11-13 DIAGNOSIS — Z8489 Family history of other specified conditions: Secondary | ICD-10-CM

## 2015-11-13 DIAGNOSIS — F332 Major depressive disorder, recurrent severe without psychotic features: Secondary | ICD-10-CM

## 2015-11-13 DIAGNOSIS — Z808 Family history of malignant neoplasm of other organs or systems: Secondary | ICD-10-CM

## 2015-11-13 DIAGNOSIS — R079 Chest pain, unspecified: Secondary | ICD-10-CM | POA: Diagnosis not present

## 2015-11-13 DIAGNOSIS — F1410 Cocaine abuse, uncomplicated: Secondary | ICD-10-CM | POA: Diagnosis not present

## 2015-11-13 DIAGNOSIS — F1494 Cocaine use, unspecified with cocaine-induced mood disorder: Secondary | ICD-10-CM | POA: Diagnosis not present

## 2015-11-13 LAB — COMPREHENSIVE METABOLIC PANEL
ALBUMIN: 3.2 g/dL — AB (ref 3.5–5.0)
ALT: 13 U/L — ABNORMAL LOW (ref 17–63)
ANION GAP: 6 (ref 5–15)
AST: 26 U/L (ref 15–41)
Alkaline Phosphatase: 41 U/L (ref 38–126)
BILIRUBIN TOTAL: 1.3 mg/dL — AB (ref 0.3–1.2)
BUN: 10 mg/dL (ref 6–20)
CO2: 27 mmol/L (ref 22–32)
Calcium: 8.7 mg/dL — ABNORMAL LOW (ref 8.9–10.3)
Chloride: 107 mmol/L (ref 101–111)
Creatinine, Ser: 1.15 mg/dL (ref 0.61–1.24)
GFR calc non Af Amer: >60 mL/min (ref >60)
GLUCOSE: 93 mg/dL (ref 65–99)
POTASSIUM: 3.1 mmol/L — AB (ref 3.5–5.1)
SODIUM: 140 mmol/L (ref 135–145)
TOTAL PROTEIN: 6 g/dL — AB (ref 6.5–8.1)

## 2015-11-13 LAB — CBC WITH DIFFERENTIAL/PLATELET
Basophils Absolute: 0 10*3/uL (ref 0.0–0.1)
Basophils Relative: 0 %
EOS ABS: 0.2 10*3/uL (ref 0.0–0.7)
EOS PCT: 3 %
HCT: 32.2 % — ABNORMAL LOW (ref 39.0–52.0)
Hemoglobin: 10.6 g/dL — ABNORMAL LOW (ref 13.0–17.0)
LYMPHS ABS: 2.2 10*3/uL (ref 0.7–4.0)
LYMPHS PCT: 35 %
MCH: 28.6 pg (ref 26.0–34.0)
MCHC: 32.9 g/dL (ref 30.0–36.0)
MCV: 87 fL (ref 78.0–100.0)
MONOS PCT: 7 %
Monocytes Absolute: 0.5 10*3/uL (ref 0.1–1.0)
NEUTROS PCT: 55 %
Neutro Abs: 3.4 10*3/uL (ref 1.7–7.7)
PLATELETS: 145 10*3/uL — AB (ref 150–400)
RBC: 3.7 MIL/uL — AB (ref 4.22–5.81)
RDW: 15 % (ref 11.5–15.5)
WBC: 6.2 10*3/uL (ref 4.0–10.5)

## 2015-11-13 LAB — GLUCOSE, CAPILLARY
GLUCOSE-CAPILLARY: 91 mg/dL (ref 65–99)
GLUCOSE-CAPILLARY: 107 mg/dL — AB (ref 65–99)
Glucose-Capillary: 129 mg/dL — ABNORMAL HIGH (ref 65–99)
Glucose-Capillary: 110 mg/dL — ABNORMAL HIGH (ref 65–99)

## 2015-11-13 LAB — TROPONIN I
TROPONIN I: 0.04 ng/mL — AB (ref <0.03)
TROPONIN I: 0.03 ng/mL — AB (ref <0.03)
Troponin I: <0.03 ng/mL (ref <0.03)

## 2015-11-13 MED ORDER — POTASSIUM CHLORIDE CRYS ER 20 MEQ PO TBCR
40.0000 meq | EXTENDED_RELEASE_TABLET | Freq: Once | ORAL | Status: AC
  Administered 2015-11-13: 40 meq via ORAL
  Filled 2015-11-13: qty 2

## 2015-11-13 MED ORDER — DOXYCYCLINE HYCLATE 100 MG IV SOLR
100.0000 mg | Freq: Two times a day (BID) | INTRAVENOUS | Status: DC
  Administered 2015-11-13 – 2015-11-16 (×7): 100 mg via INTRAVENOUS
  Filled 2015-11-13 (×8): qty 100

## 2015-11-13 NOTE — Care Management Obs Status (Signed)
[facility] NOTIFICATION   Patient Details  Name: SHADON TOUPIN MRN: [id number] Date of Birth: 1960/12/11   Medicare Observation Status Notification Given:  Yes    Dawayne Patricia, RN 11/13/2015, 4:01 PM

## 2015-11-13 NOTE — Consult Note (Signed)
[facility] Consult   Reason for Consult:  Depression, suicide ideation and also has cocaine abuse Referring Physician:  Dr. Erlinda Hong Patient Identification: CLAIBORNE STROBLE MRN:  [id number] Principal Diagnosis: Cocaine-induced mood disorder [facility]) Diagnosis:   Patient Active Problem List   Diagnosis Date Noted  . Chest pain [R07.9] 11/12/2015  . Type 2 diabetes mellitus with vascular disease ([place]) [E11.59] 11/12/2015  . History of seizure [Z86.69] 11/12/2015  . Cocaine abuse [F14.10] 01/26/2015  . Cocaine-induced mood disorder ([place]) [F14.94] 01/26/2015  . Helicobacter pylori gastritis [B96.81] 05/30/2012  . Pure hypercholesterolemia [E78.00] 08/31/2011  . Essential hypertension, benign [I10] 08/31/2011  . 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 PAD (peripheral artery disease) ([place]) [I73.9] 08/31/2011  . GERD (gastroesophageal reflux disease) [K21.9] 12/07/2010  . Esophageal dysphagia [R13.14] 12/07/2010  . Constipation [K59.00] 12/07/2010  . Laryngopharyngeal reflux (LPR) [J38.7] 12/07/2010  . Lumbar pain with radiation down left leg [M54.5] 11/17/2010  . CARPAL TUNNEL SYNDROME [G56.00] 07/14/2009  . SHOULDER IMPINGEMENT SYNDROME, LEFT [M75.80] 05/05/2009    Total Time spent with patient: 1 hour  Subjective:   Dalton [name] is a 55 y.o. male patient admitted with chest pain.  HPI:  Dalton [name] is a 55 y.o. male seen, chart reviewed for this face-to-face psychiatry consultation and evaluation of suicidal ideation. Patient stated that he [place] get hunting knife to cut himself but he could not go to the [place] because of shortness of breath and chest pain. Patient endorses symptoms of depression, sadness, unhappy, feeling lonely, inadequate resources and see the world being dark and stated he has no will to live on this world. Patient also reported multiple family members died and his children were defined to him and does  not care for him. Patient also depressed about not able to see his grandchildren. Patient stated that he does not have any family members that care for him and is not having enough financial resources to leave his life and take care of his chronic multiple medical problems. Patient was pretty much upset and angry when discussed about drug of abuse especially cocaine. Patient reported he uses cocaine twice a month to have sexual activity and denied abuse or dependence. Patient endorses ongoing suicidal ideation, intention or plans of cutting himself or drinking chemicals to kill himself. Patient also reportedly has a multiple unsuccessfulsuicidal attempts in the past. Patients seems to be confrontational  and repeatedly asked to put him sleep by giving a cocktail of injections or injecting a bubble in his blood vessels. When informed to his physician is here to help him control his emotional problems and patient stated no one can help him and is not going to change his mind from chronic ongoing recurrent suicidal thoughts. Patient urine drug screen is positive for opiates and cocaine reportedly patient has been prescribed for opiates for his multiple chronic pain syndrome including 3 back surgeries, left knee pain and shoulder pain.  Medical history: Patient with CAD status post CABG, diabetes mellitus type 2, history of seizures, previous history of drug abuse presents to the ER because of chest pain. Patient states he has been experiencing subjective feeling of fever chills last one month. He has been to in ER recently and was prescribed doxycycline for possible tick-related disease which patient is yet to start taking presents to the ER because of chest pain. Patient has been experiencing chest pain last 2 weeks which increases on lying down. Denies any associated shortness  of breath nausea vomiting diarrhea. The patient is mildly febrile. Chest x-ray and EKG unremarkable. On-call cardiologist was consulted and  felt that patient may be having pericarditis and admitted for further observation and management. UDS is positive for opioids and cocaine.  Past Psychiatric History: Significant for cocaine abuse and substance induced mood disorder. Patient has a multiple acute psychiatric hospitalization at behavioral health [facility] since 2003 and his recent hospitalization was in December 2016, at behavioral health [facility] observation unit.  Risk to Self: Is patient at risk for suicide?: No Risk to Others:   Prior Inpatient Therapy:   Prior Outpatient Therapy:    Past Medical History:  Past Medical History:  Diagnosis Date  . Anemia   . Arthritis   . Bronchitis, chronic ([place])   . CHF (congestive heart failure) ([place])   . Chronic back pain    [facility] Clinic in [place]  . Chronic bronchitis   . Congestive heart failure (CHF) ([place])   . Coronary artery disease   . Depression   . Frequency of urination   . GERD (gastroesophageal reflux disease)   . Headache(784.0)   . High cholesterol   . Hypertension   . Laceration of right hand 11/27/10  . Laceration of wrist 2007 BIL FOREARMS  . MI (myocardial infarction) ([place])    7, last one was in 2011  . PUD (peptic ulcer disease)    in 1990s, secondary to medication  . Seizures ([place])    entire life, last seizure in 2011;unknown etiology-pt sts heriditary  . Shortness of breath    with exertion  . Tonsillitis, chronic    Dr. Vicki Mallet in [place]    Past Surgical History:  Procedure Laterality Date  . BACK SURGERY     3-  . BIOPSY N/A 05/30/2012   Procedure: BIOPSY;  Surgeon: Danie Binder, MD;  Location: [facility];  Service: Endoscopy;  Laterality: N/A;  . CARPAL TUNNEL RELEASE     bilateral  . COLONOSCOPY  12/28/10   SLF: ([facility])Internal hemorrhoids/four small colon polyps  . CORONARY ARTERY BYPASS GRAFT  2002   3 vessels  . ESOPHAGOGASTRODUODENOSCOPY N/A 05/30/2012   SLF: UNCONTROLLED GERD DUE TO LIFESTYLE CHOICE/WEIGHT GAIN/MILD Non-erosive  gastritis  . KNEE SURGERY     left-plate to left knee cap from accident  . LEFT HEART CATHETERIZATION WITH CORONARY ANGIOGRAM N/A 08/31/2011   Procedure: LEFT HEART CATHETERIZATION WITH CORONARY ANGIOGRAM;  Surgeon: Laverda Page, MD;  Location: [facility] CATH LAB;  Service: Cardiovascular;  Laterality: N/A;  . SAVORY DILATION  12/28/2010   SLF:(MAC)J-shaped stomach/nodular mocosa in the distal esophagus/empiric dilation 81m   Family History:  Family History  Problem Relation Age of Onset  . Diabetes Mother   . Hypertension Mother   . Heart attack Mother   . Hypertension Father   . Diabetes Father   . Heart attack Father   . Heart attack      mother, father, brother, sister all deceased due to MI  . Heart attack Sister   . Heart attack Brother   . Seizures Brother   . Heart failure Other   . Colon cancer Neg Hx   . Liver disease Neg Hx   . Anesthesia problems Neg Hx   . Hypotension Neg Hx   . Malignant hyperthermia Neg Hx   . Pseudochol deficiency Neg Hx   . Colon polyps Neg Hx    Family Psychiatric  History: He has disabled son. Social History:  History  Alcohol Use  .  0.0 oz/week    Comment: Occasions.     History  Drug Use No    Comment: history of cocaine, etoh, marijuana but denies any the last several years-12 yrs ago    Social History   Social History  . Marital status: Single    Spouse name: N/A  . Number of children: 2  . Years of education: N/A   Occupational History  . disabled Not Employed   Social History Main Topics  . Smoking status: Never Smoker  . Smokeless tobacco: Never Used     Comment: Never Smoked  . Alcohol use 0.0 oz/week     Comment: Occasions.  . Drug use: No     Comment: history of cocaine, etoh, marijuana but denies any the last several years-12 yrs ago  . Sexual activity: Not Asked   Other Topics Concern  . None   Social History Narrative   Lives w/ son-23/22   Additional Social History:    Allergies:   Allergies   Allergen Reactions  . Gabapentin Hives  . Ibuprofen Other (See Comments)    REACTION:Stomach  upset  . Zolpidem Tartrate Other (See Comments)    REACTION: Hallucinations   . Naproxen Other (See Comments)    REACTION: unknown    Labs:  Results for orders placed or performed during the hospital encounter of 11/12/15 (from the past 48 hour(s))  CBC with Differential/Platelet     Status: Abnormal   Collection Time: 11/12/15  6:09 PM  Result Value Ref Range   WBC 10.8 (H) 4.0 - 10.5 K/uL   RBC 4.27 4.22 - 5.81 MIL/uL   Hemoglobin 12.4 (L) 13.0 - 17.0 g/dL   HCT 86.9 (L) 57.9 - 23.8 %   MCV 87.1 78.0 - 100.0 fL   MCH 29.0 26.0 - 34.0 pg   MCHC 33.3 30.0 - 36.0 g/dL   RDW 25.0 92.8 - 80.1 %   Platelets 197 150 - 400 K/uL   Neutrophils Relative % 82 %   Neutro Abs 8.8 (H) 1.7 - 7.7 K/uL   Lymphocytes Relative 13 %   Lymphs Abs 1.5 0.7 - 4.0 K/uL   Monocytes Relative 5 %   Monocytes Absolute 0.5 0.1 - 1.0 K/uL   Eosinophils Relative 0 %   Eosinophils Absolute 0.0 0.0 - 0.7 K/uL   Basophils Relative 0 %   Basophils Absolute 0.0 0.0 - 0.1 K/uL  Basic metabolic panel     Status: Abnormal   Collection Time: 11/12/15  6:09 PM  Result Value Ref Range   Sodium 145 135 - 145 mmol/L   Potassium 3.8 3.5 - 5.1 mmol/L   Chloride 110 101 - 111 mmol/L   CO2 25 22 - 32 mmol/L   Glucose, Bld 109 (H) 65 - 99 mg/dL   BUN 14 6 - 20 mg/dL   Creatinine, Ser 3.27 (H) 0.61 - 1.24 mg/dL   Calcium 20.5 8.9 - 08.4 mg/dL   GFR calc non Af Amer 53 (L) >60 mL/min   GFR calc Af Amer >60 >60 mL/min    Comment: (NOTE) The eGFR has been calculated using the CKD EPI equation. This calculation has not been validated in all clinical situations. eGFR's persistently <60 mL/min signify possible Chronic Kidney Disease.    Anion gap 10 5 - 15  I-stat troponin, ED     Status: None   Collection Time: 11/12/15  6:25 PM  Result Value Ref Range   Troponin i, poc 0.03 0.00 - 0.08 ng/mL  Comment 3             Comment: Due to the release kinetics of cTnI, a negative result within the first hours of the onset of symptoms does not rule out myocardial infarction with certainty. If myocardial infarction is still suspected, repeat the test at appropriate intervals.   Urine rapid drug screen (hosp performed)     Status: Abnormal   Collection Time: 11/12/15  9:02 PM  Result Value Ref Range   Opiates POSITIVE (A) NONE DETECTED   Cocaine POSITIVE (A) NONE DETECTED   Benzodiazepines NONE DETECTED NONE DETECTED   Amphetamines NONE DETECTED NONE DETECTED   Tetrahydrocannabinol NONE DETECTED NONE DETECTED   Barbiturates NONE DETECTED NONE DETECTED    Comment:        DRUG SCREEN FOR MEDICAL PURPOSES ONLY.  IF CONFIRMATION IS NEEDED FOR ANY PURPOSE, NOTIFY LAB WITHIN 5 DAYS.        LOWEST DETECTABLE LIMITS FOR URINE DRUG SCREEN Drug Class       Cutoff (ng/mL) Amphetamine      1000 Barbiturate      200 Benzodiazepine   397 Tricyclics       673 Opiates          300 Cocaine          300 THC              50   Troponin I (q 6hr x 3)     Status: Abnormal   Collection Time: 11/12/15 10:41 PM  Result Value Ref Range   Troponin I 0.04 (HH) <0.03 ng/mL    Comment: CRITICAL RESULT CALLED TO, READ BACK BY AND VERIFIED WITH: OSAGIE,I RN 11/13/2015 0027 JORDANS   Troponin I (q 6hr x 3)     Status: Abnormal   Collection Time: 11/13/15  4:25 AM  Result Value Ref Range   Troponin I 0.03 (HH) <0.03 ng/mL    Comment: CRITICAL VALUE NOTED.  VALUE IS CONSISTENT WITH PREVIOUSLY REPORTED AND CALLED VALUE.  Comprehensive metabolic panel     Status: Abnormal   Collection Time: 11/13/15  4:25 AM  Result Value Ref Range   Sodium 140 135 - 145 mmol/L   Potassium 3.1 (L) 3.5 - 5.1 mmol/L    Comment: DELTA CHECK NOTED   Chloride 107 101 - 111 mmol/L   CO2 27 22 - 32 mmol/L   Glucose, Bld 93 65 - 99 mg/dL   BUN 10 6 - 20 mg/dL   Creatinine, Ser 1.15 0.61 - 1.24 mg/dL   Calcium 8.7 (L) 8.9 - 10.3 mg/dL   Total  Protein 6.0 (L) 6.5 - 8.1 g/dL   Albumin 3.2 (L) 3.5 - 5.0 g/dL   AST 26 15 - 41 U/L   ALT 13 (L) 17 - 63 U/L   Alkaline Phosphatase 41 38 - 126 U/L   Total Bilirubin 1.3 (H) 0.3 - 1.2 mg/dL   GFR calc non Af Amer >60 >60 mL/min   GFR calc Af Amer >60 >60 mL/min    Comment: (NOTE) The eGFR has been calculated using the CKD EPI equation. This calculation has not been validated in all clinical situations. eGFR's persistently <60 mL/min signify possible Chronic Kidney Disease.    Anion gap 6 5 - 15  CBC WITH DIFFERENTIAL     Status: Abnormal   Collection Time: 11/13/15  4:25 AM  Result Value Ref Range   WBC 6.2 4.0 - 10.5 K/uL   RBC 3.70 (L) 4.22 - 5.81  MIL/uL   Hemoglobin 10.6 (L) 13.0 - 17.0 g/dL   HCT 32.2 (L) 39.0 - 52.0 %   MCV 87.0 78.0 - 100.0 fL   MCH 28.6 26.0 - 34.0 pg   MCHC 32.9 30.0 - 36.0 g/dL   RDW 15.0 11.5 - 15.5 %   Platelets 145 (L) 150 - 400 K/uL   Neutrophils Relative % 55 %   Neutro Abs 3.4 1.7 - 7.7 K/uL   Lymphocytes Relative 35 %   Lymphs Abs 2.2 0.7 - 4.0 K/uL   Monocytes Relative 7 %   Monocytes Absolute 0.5 0.1 - 1.0 K/uL   Eosinophils Relative 3 %   Eosinophils Absolute 0.2 0.0 - 0.7 K/uL   Basophils Relative 0 %   Basophils Absolute 0.0 0.0 - 0.1 K/uL  Glucose, capillary     Status: None   Collection Time: 11/13/15  6:21 AM  Result Value Ref Range   Glucose-Capillary 91 65 - 99 mg/dL   Comment 1 Notify RN   Troponin I (q 6hr x 3)     Status: None   Collection Time: 11/13/15  9:13 AM  Result Value Ref Range   Troponin I <0.03 <0.03 ng/mL  Glucose, capillary     Status: Abnormal   Collection Time: 11/13/15 11:04 AM  Result Value Ref Range   Glucose-Capillary 129 (H) 65 - 99 mg/dL   Comment 1 Notify RN    Comment 2 Document in Chart     Current Facility-Administered Medications  Medication Dose Route Frequency Provider Last Rate Last Dose  . 0.9 %  sodium chloride infusion   Intravenous Continuous Rise Patience, MD 100 mL/hr at  11/12/15 2321    . acetaminophen (TYLENOL) tablet 650 mg  650 mg Oral Q6H PRN Rise Patience, MD       Or  . acetaminophen (TYLENOL) suppository 650 mg  650 mg Rectal Q6H PRN Rise Patience, MD      . albuterol (PROVENTIL) (2.5 MG/3ML) 0.083% nebulizer solution 2.5 mg  2.5 mg Nebulization TID Rise Patience, MD   2.5 mg at 11/13/15 0849  . albuterol (PROVENTIL) (2.5 MG/3ML) 0.083% nebulizer solution 2.5 mg  2.5 mg Inhalation Q6H PRN Rise Patience, MD      . ALPRAZolam Duanne Moron) tablet 2 mg  2 mg Oral BID Rise Patience, MD   2 mg at 11/13/15 0939  . aspirin tablet 325 mg  325 mg Oral Daily Rise Patience, MD   325 mg at 11/13/15 0935  . atorvastatin (LIPITOR) tablet 10 mg  10 mg Oral q morning - 10a Rise Patience, MD   10 mg at 11/13/15 0937  . budesonide (PULMICORT) nebulizer solution 0.5 mg  0.5 mg Inhalation BID Rise Patience, MD   0.5 mg at 11/13/15 0849  . clopidogrel (PLAVIX) tablet 75 mg  75 mg Oral QAC breakfast Rise Patience, MD   75 mg at 11/13/15 0824  . colchicine tablet 0.6 mg  0.6 mg Oral BID Dorothy Spark, MD   0.6 mg at 11/13/15 0940  . doxycycline (VIBRAMYCIN) 100 mg in dextrose 5 % 250 mL IVPB  100 mg Intravenous BID Rise Patience, MD   100 mg at 11/13/15 0940  . fluticasone (FLONASE) 50 MCG/ACT nasal spray 2 spray  2 spray Each Nare Daily PRN Rise Patience, MD      . hydrOXYzine (ATARAX/VISTARIL) tablet 25 mg  25 mg Oral Q6H Rise Patience, MD  25 mg at 11/13/15 1228  . ibuprofen (ADVIL,MOTRIN) tablet 200 mg  200 mg Oral TID Dorothy Spark, MD   200 mg at 11/13/15 0936  . insulin aspart (novoLOG) injection 0-9 Units  0-9 Units Subcutaneous TID WC Rise Patience, MD   1 Units at 11/13/15 1228  . ipratropium (ATROVENT) nebulizer solution 0.5 mg  0.5 mg Nebulization Q6H PRN Rise Patience, MD      . lamoTRIgine (LAMICTAL) tablet 200 mg  200 mg Oral Daily Rise Patience, MD   200 mg at 11/13/15  0939  . levETIRAcetam (KEPPRA) tablet 500 mg  500 mg Oral Q12H Rise Patience, MD   500 mg at 11/13/15 5643  . lisinopril (PRINIVIL,ZESTRIL) tablet 5 mg  5 mg Oral Daily Rise Patience, MD   5 mg at 11/13/15 0940  . loratadine (CLARITIN) tablet 10 mg  10 mg Oral Daily Rise Patience, MD   10 mg at 11/13/15 0936  . metoprolol tartrate (LOPRESSOR) tablet 25 mg  25 mg Oral BID Rise Patience, MD   25 mg at 11/13/15 3295  . ondansetron (ZOFRAN) tablet 4 mg  4 mg Oral Q6H PRN Rise Patience, MD       Or  . ondansetron [facility]) injection 4 mg  4 mg Intravenous Q6H PRN Rise Patience, MD      . oxyCODONE-acetaminophen (PERCOCET/ROXICET) 5-325 MG per tablet 1 tablet  1 tablet Oral Q6H PRN Rise Patience, MD   1 tablet at 11/12/15 2340   And  . oxyCODONE (Oxy IR/ROXICODONE) immediate release tablet 5 mg  5 mg Oral Q6H PRN Rise Patience, MD      . pantoprazole (PROTONIX) EC tablet 40 mg  40 mg Oral Daily Rise Patience, MD   40 mg at 11/13/15 0936  . pregabalin (LYRICA) capsule 100-200 mg  100-200 mg Oral TID PRN Rise Patience, MD      . sertraline (ZOLOFT) tablet 100 mg  100 mg Oral Daily Rise Patience, MD   100 mg at 11/13/15 0939  . tamsulosin (FLOMAX) capsule 0.4 mg  0.4 mg Oral QPC breakfast Rise Patience, MD   0.4 mg at 11/13/15 1884    Musculoskeletal: Strength & Muscle Tone: decreased Gait & Station: unable to stand Patient leans: N/A  Psychiatric Specialty Exam: Physical Exam as per history and physical  ROS patient complaining of generalized weakness,depression, lack of energy and ongoing suicidal thoughts, recent cocaine intoxication and chest pain along with shortness of breath. Patient denied current chest pain and shortness of breath No Fever-chills, No Headache, No changes with Vision or hearing, reports vertigo No problems swallowing food or Liquids, No Chest pain, Cough or Shortness of Breath, No Abdominal pain, No  Nausea or Vommitting, Bowel movements are regular, No Blood in stool or Urine, No dysuria, No new skin rashes or bruises, No new joints pains-aches,  No new weakness, tingling, numbness in any extremity, No recent weight gain or loss, No polyuria, polydypsia or polyphagia,  A full 10 point Review of Systems was done, except as stated above, all other Review of Systems were negative.  Blood pressure (!) 93/57, pulse 62, temperature 97.4 F (36.3 C), temperature source Oral, resp. rate 16, height '5\' 10"'$  (1.778 m), weight 103.5 kg (228 lb 2.8 oz), SpO2 98 %.Body mass index is 32.74 kg/m.  General Appearance: Guarded  Eye Contact:  Good  Speech:  Clear and Coherent  Volume:  Normal  Mood:  Depressed, Hopeless, Irritable and Worthless  Affect:  Depressed  Thought Process:  Coherent and Goal Directed  Orientation:  Full (Time, Place, and Person)  Thought Content:  Rumination  Suicidal Thoughts:  Yes.  with intent/plan  Homicidal Thoughts:  No  Memory:  Immediate;   Good Recent;   Fair Remote;   Fair  Judgement:  Impaired  Insight:  Fair  Psychomotor Activity:  Decreased  Concentration:  Concentration: Fair and Attention Span: Fair  Recall:  Good  Fund of Knowledge:  Good  Language:  Good  Akathisia:  Negative  Handed:  Right  AIMS (if indicated):     Assets:  Communication Skills Desire for Improvement Financial Resources/Insurance Housing Leisure Time Resilience  ADL's:  Impaired  Cognition:  WNL  Sleep:        Treatment Plan Summary: This is a 55 years old single male, disabled with multiple medical problems including chronic pain syndrome, substance abuse and depression admitted to the hospital with chest pain secondary to cocaine intoxication. Patient endorses current symptoms of depression, suicidal ideation, intention or plan.  Safety concerns: [employer]  Continue sertraline 100 mg daily for depression Patient was offered substance abuse treatment  which he denies saying using cocaine is not problem to him Patient meets criteria for acute psychiatric hospitilization when medically stable Referred to the unit social service to contact behavioral [facility] for appropriate psychiatric placement  Appreciate psychiatric consultation and follow up as clinically required Please contact [phone] or [phone] if needs further assistance  Disposition: Recommend psychiatric Inpatient admission when medically cleared. Supportive therapy provided about ongoing stressors.  Ambrose Finland, MD 11/13/2015 1:47 PM

## 2015-11-13 NOTE — Progress Notes (Signed)
PROGRESS NOTE  Dalton [name] [id number] DOB: May 22, 1960 DOA: 11/12/2015 PCP: Philis Fendt, MD  HPI/Recap of past 24 hours:  Continued suicidal ideation, sitter in room  Assessment/Plan: Principal Problem:   Chest pain Active Problems:   Essential hypertension, benign   Type 2 diabetes mellitus with vascular disease ([place])   History of seizure  1. Chest pain - patient'S chest pain increases on lying down concerning for possible pericarditis given that patient also recently had febrile illness. Appreciate cardiology consult and patient is on colchicine and NSAIDs. Check 2-D echo, cardiac markers negative. UDS + cocaine. Patient is on statins, metoprolol and aspirin and Plavix. 2. Febrile illness - patient has been experiencing fever and chills for last 1 month. Check blood cultures, RMSF titer and Lyme titer since patient was told he may be having tick born disease. Doxycycline started on admission 3. History of seizures on Lamictal and Keppra. Has not had any seizures recently. 4. History of polysubstance abuse - patient states he has not taken cocaine for many months. Advised to quit smoking. 5. Diabetes mellitus type 2 - will keep patient on sliding scale coverage. 6. Anemia - follow CBC. 7. Hypertension - We will hold lisinopril due to mildly elevated creatinine. When necessary IV hydralazine for systolic blood pressure more than 160.  8. Depression - patient had endorsed suicidal ideas on arrival to the ER. psychiatry consulted   DVT prophylaxis: SCDs. Code Status: Full code.  Family Communication: Discussed with patient.  Disposition Plan: possible inpatient psych Consults called: Cardiology. psych    Procedures:  none  Antibiotics:  doxycycline   Objective: BP (!) 96/47 (BP Location: Right Arm)   Pulse 63   Temp 97.5 F (36.4 C) (Oral)   Resp 17   Ht 5\' 10"  (1.778 m)   Wt 103.5 kg (228 lb 2.8 oz)   SpO2 100%   BMI 32.74 kg/m   Intake/Output  Summary (Last 24 hours) at 11/13/15 0841 Last data filed at 11/13/15 0803  Gross per 24 hour  Intake              720 ml  Output              400 ml  Net              320 ml   Filed Weights   11/12/15 2305  Weight: 103.5 kg (228 lb 2.8 oz)    Exam:   General:  NAD  Cardiovascular: RRR  Respiratory: CTABL  Abdomen: Soft/ND/NT, positive BS  Musculoskeletal: No Edema  Neuro: aaox3  Data Reviewed: Basic Metabolic Panel:  Recent Labs Lab 11/12/15 1809 11/13/15 0425  NA 145 140  K 3.8 3.1*  CL 110 107  CO2 25 27  GLUCOSE 109* 93  BUN 14 10  CREATININE 1.46* 1.15  CALCIUM 10.2 8.7*   Liver Function Tests:  Recent Labs Lab 11/13/15 0425  AST 26  ALT 13*  ALKPHOS 41  BILITOT 1.3*  PROT 6.0*  ALBUMIN 3.2*   No results for input(s): LIPASE, AMYLASE in the last 168 hours. No results for input(s): AMMONIA in the last 168 hours. CBC:  Recent Labs Lab 11/12/15 1809 11/13/15 0425  WBC 10.8* 6.2  NEUTROABS 8.8* 3.4  HGB 12.4* 10.6*  HCT 37.2* 32.2*  MCV 87.1 87.0  PLT 197 145*   Cardiac Enzymes:    Recent Labs Lab 11/12/15 2241 11/13/15 0425  TROPONINI 0.04* 0.03*   BNP (last 3 results) No results  for input(s): BNP in the last 8760 hours.  ProBNP (last 3 results) No results for input(s): PROBNP in the last 8760 hours.  CBG:  Recent Labs Lab 11/13/15 0621  GLUCAP 91    No results found for this or any previous visit (from the past 240 hour(s)).   Studies: Dg Chest 2 View  Result Date: 11/12/2015 CLINICAL DATA:  Chest pain and shortness of breath for several weeks. History of hypertension, CHF. EXAM: CHEST  2 VIEW COMPARISON:  Chest radiograph October 17, 2015 FINDINGS: Cardiomediastinal silhouette is normal. Status post median sternotomy for CABG. Fractured most caudal median sternotomy wire. LEFT subclavian stent. No pleural effusions or focal consolidations. Trachea projects midline and there is no pneumothorax. Soft tissue planes and  included osseous structures are non-suspicious. IMPRESSION: No acute cardiopulmonary process. Electronically Signed   By: Elon Alas M.D.   On: 11/12/2015 18:48    Scheduled Meds: . albuterol  2.5 mg Nebulization TID  . alprazolam  2 mg Oral BID  . aspirin  325 mg Oral Daily  . atorvastatin  10 mg Oral q morning - 10a  . budesonide  0.5 mg Inhalation BID  . clopidogrel  75 mg Oral QAC breakfast  . colchicine  0.6 mg Oral BID  . doxycycline (VIBRAMYCIN) IV  100 mg Intravenous BID  . hydrOXYzine  25 mg Oral Q6H  . ibuprofen  200 mg Oral TID  . insulin aspart  0-9 Units Subcutaneous TID WC  . lamoTRIgine  200 mg Oral Daily  . levETIRAcetam  500 mg Oral Q12H  . lisinopril  5 mg Oral Daily  . loratadine  10 mg Oral Daily  . metoprolol tartrate  25 mg Oral BID  . pantoprazole  40 mg Oral Daily  . potassium chloride  40 mEq Oral Once  . sertraline  100 mg Oral Daily  . tamsulosin  0.4 mg Oral QPC breakfast    Continuous Infusions: . sodium chloride 100 mL/hr at 11/12/15 2321     Time spent: 28mins  Chrystle Murillo MD, PhD  [facility] Pager [phone]. If 7PM-7AM, please contact night-coverage at [email or web address], password [facility] 11/13/2015, 8:41 AM  LOS: 0 days

## 2015-11-14 ENCOUNTER — Observation Stay (HOSPITAL_BASED_OUTPATIENT_CLINIC_OR_DEPARTMENT_OTHER): Payer: Medicare Other

## 2015-11-14 ENCOUNTER — Other Ambulatory Visit (HOSPITAL_COMMUNITY): Payer: Self-pay

## 2015-11-14 DIAGNOSIS — R079 Chest pain, unspecified: Secondary | ICD-10-CM | POA: Diagnosis not present

## 2015-11-14 DIAGNOSIS — F1494 Cocaine use, unspecified with cocaine-induced mood disorder: Secondary | ICD-10-CM | POA: Diagnosis not present

## 2015-11-14 LAB — CBC
HCT: 33.8 % — ABNORMAL LOW (ref 39.0–52.0)
Hemoglobin: 11 g/dL — ABNORMAL LOW (ref 13.0–17.0)
MCH: 28.6 pg (ref 26.0–34.0)
MCHC: 32.5 g/dL (ref 30.0–36.0)
MCV: 88 fL (ref 78.0–100.0)
PLATELETS: 136 10*3/uL — AB (ref 150–400)
RBC: 3.84 MIL/uL — ABNORMAL LOW (ref 4.22–5.81)
RDW: 15 % (ref 11.5–15.5)
WBC: 3.7 10*3/uL — ABNORMAL LOW (ref 4.0–10.5)

## 2015-11-14 LAB — COMPREHENSIVE METABOLIC PANEL
ALBUMIN: 3.2 g/dL — AB (ref 3.5–5.0)
ALT: 14 U/L — ABNORMAL LOW (ref 17–63)
ANION GAP: 6 (ref 5–15)
AST: 27 U/L (ref 15–41)
Alkaline Phosphatase: 55 U/L (ref 38–126)
BUN: 9 mg/dL (ref 6–20)
CO2: 25 mmol/L (ref 22–32)
Calcium: 8.4 mg/dL — ABNORMAL LOW (ref 8.9–10.3)
Chloride: 107 mmol/L (ref 101–111)
Creatinine, Ser: 1.01 mg/dL (ref 0.61–1.24)
GFR calc non Af Amer: >60 mL/min (ref >60)
GLUCOSE: 98 mg/dL (ref 65–99)
POTASSIUM: 4 mmol/L (ref 3.5–5.1)
SODIUM: 138 mmol/L (ref 135–145)
Total Bilirubin: 0.6 mg/dL (ref 0.3–1.2)
Total Protein: 5.7 g/dL — ABNORMAL LOW (ref 6.5–8.1)

## 2015-11-14 LAB — GLUCOSE, CAPILLARY
GLUCOSE-CAPILLARY: 105 mg/dL — AB (ref 65–99)
GLUCOSE-CAPILLARY: 102 mg/dL — AB (ref 65–99)
GLUCOSE-CAPILLARY: 119 mg/dL — AB (ref 65–99)
Glucose-Capillary: 93 mg/dL (ref 65–99)

## 2015-11-14 LAB — MAGNESIUM: Magnesium: 1.9 mg/dL (ref 1.7–2.4)

## 2015-11-14 LAB — B. BURGDORFI ANTIBODIES

## 2015-11-14 LAB — HIV ANTIBODY (ROUTINE TESTING W REFLEX): HIV SCREEN 4TH GENERATION: NONREACTIVE

## 2015-11-14 NOTE — Progress Notes (Signed)
PROGRESS NOTE  Dalton [name] [id number] DOB: 06/04/1960 DOA: 11/12/2015 PCP: Philis Fendt, MD  HPI/Recap of past 24 hours:  Continued suicidal ideation, sitter in room  Patient states he did not remember seeing me yesterday, he did not remember echocardiogram was being done this morning, otherwise NAD  Assessment/Plan: Principal Problem:   Cocaine-induced mood disorder ([place]) Active Problems:   Essential hypertension, benign   Cocaine abuse   Chest pain   Type 2 diabetes mellitus with vascular disease ([place])   History of seizure  1. Chest pain - patient'S chest pain increases on lying down concerning for possible pericarditis given that patient also recently had febrile illness. Appreciate cardiology consult and patient is on colchicine and NSAIDs. 2-D echo result pending cardiac markers negative. UDS + cocaine. Patient is on statins, metoprolol and aspirin and Plavix. 2. Febrile illness - patient has been experiencing fever and chills for last 1 month. Check blood cultures, RMSF titer and Lyme titer since patient was told he may be having tick born disease. Doxycycline started on admission 3. History of seizures on Lamictal and Keppra. Has not had any seizures recently. 4. History of polysubstance abuse - patient states he has not taken cocaine for many months. Advised to quit smoking. 5. Diabetes mellitus type 2 - a1c pending, will keep patient on sliding scale coverage. 6. Anemia - follow CBC. 7. Hypertension - continue lopressor and lisinopril.  8. Depression - patient had endorsed suicidal ideas on arrival to the ER. psychiatry consulted 9. AKI; cr normalized   DVT prophylaxis: SCDs. Code Status: Full code.  Family Communication: Discussed with patient.  Disposition Plan:  inpatient psych  Consults called: Cardiology. psych    Procedures:  none  Antibiotics:  doxycycline   Objective: BP (!) 106/55   Pulse 78   Temp 97.4 F (36.3 C) (Oral)   Resp  18   Ht 5\' 10"  (1.778 m)   Wt 103.5 kg (228 lb 2.8 oz)   SpO2 97%   BMI 32.74 kg/m   Intake/Output Summary (Last 24 hours) at 11/14/15 1339 Last data filed at 11/14/15 0900  Gross per 24 hour  Intake              480 ml  Output              475 ml  Net                5 ml   Filed Weights   11/12/15 2305  Weight: 103.5 kg (228 lb 2.8 oz)    Exam:   General:  NAD  Cardiovascular: RRR  Respiratory: CTABL  Abdomen: Soft/ND/NT, positive BS  Musculoskeletal: No Edema  Neuro: aaox3  Data Reviewed: Basic Metabolic Panel:  Recent Labs Lab 11/12/15 1809 11/13/15 0425 11/14/15 0231  NA 145 140 138  K 3.8 3.1* 4.0  CL 110 107 107  CO2 25 27 25   GLUCOSE 109* 93 98  BUN 14 10 9   CREATININE 1.46* 1.15 1.01  CALCIUM 10.2 8.7* 8.4*  MG  --   --  1.9   Liver Function Tests:  Recent Labs Lab 11/13/15 0425 11/14/15 0231  AST 26 27  ALT 13* 14*  ALKPHOS 41 55  BILITOT 1.3* 0.6  PROT 6.0* 5.7*  ALBUMIN 3.2* 3.2*   No results for input(s): LIPASE, AMYLASE in the last 168 hours. No results for input(s): AMMONIA in the last 168 hours. CBC:  Recent Labs Lab 11/12/15 1809 11/13/15 0425 11/14/15  0231  WBC 10.8* 6.2 3.7*  NEUTROABS 8.8* 3.4  --   HGB 12.4* 10.6* 11.0*  HCT 37.2* 32.2* 33.8*  MCV 87.1 87.0 88.0  PLT 197 145* 136*   Cardiac Enzymes:    Recent Labs Lab 11/12/15 2241 11/13/15 0425 11/13/15 0913  TROPONINI 0.04* 0.03* <0.03   BNP (last 3 results) No results for input(s): BNP in the last 8760 hours.  ProBNP (last 3 results) No results for input(s): PROBNP in the last 8760 hours.  CBG:  Recent Labs Lab 11/13/15 1104 11/13/15 1702 11/13/15 2100 11/14/15 0622 11/14/15 1106  GLUCAP 129* 110* 107* 93 105*    Recent Results (from the past 240 hour(s))  Culture, blood (routine x 2)     Status: None (Preliminary result)   Collection Time: 11/12/15 10:15 PM  Result Value Ref Range Status   Specimen Description BLOOD RIGHT ARM  Final    Special Requests BOTTLES DRAWN AEROBIC AND ANAEROBIC 5CC  Final   Culture NO GROWTH < 24 HOURS  Final   Report Status PENDING  Incomplete  Culture, blood (routine x 2)     Status: None (Preliminary result)   Collection Time: 11/12/15 10:28 PM  Result Value Ref Range Status   Specimen Description BLOOD LEFT ARM  Final   Special Requests IN PEDIATRIC BOTTLE 4CC  Final   Culture NO GROWTH < 24 HOURS  Final   Report Status PENDING  Incomplete     Studies: No results found.  Scheduled Meds: . albuterol  2.5 mg Nebulization TID  . alprazolam  2 mg Oral BID  . aspirin  325 mg Oral Daily  . atorvastatin  10 mg Oral q morning - 10a  . budesonide  0.5 mg Inhalation BID  . clopidogrel  75 mg Oral QAC breakfast  . colchicine  0.6 mg Oral BID  . doxycycline (VIBRAMYCIN) IV  100 mg Intravenous BID  . hydrOXYzine  25 mg Oral Q6H  . ibuprofen  200 mg Oral TID  . insulin aspart  0-9 Units Subcutaneous TID WC  . lamoTRIgine  200 mg Oral Daily  . levETIRAcetam  500 mg Oral Q12H  . lisinopril  5 mg Oral Daily  . loratadine  10 mg Oral Daily  . metoprolol tartrate  25 mg Oral BID  . pantoprazole  40 mg Oral Daily  . sertraline  100 mg Oral Daily  . tamsulosin  0.4 mg Oral QPC breakfast    Continuous Infusions:     Time spent: 24mins  Vue Pavon MD, PhD  [facility] Pager [phone]. If 7PM-7AM, please contact night-coverage at [email or web address], password [facility] 11/14/2015, 1:39 PM  LOS: 0 days

## 2015-11-14 NOTE — Progress Notes (Signed)
  Echocardiogram 2D Echocardiogram has been performed.  Donata Clay 11/14/2015, 12:36 PM

## 2015-11-15 DIAGNOSIS — F1494 Cocaine use, unspecified with cocaine-induced mood disorder: Secondary | ICD-10-CM | POA: Diagnosis not present

## 2015-11-15 DIAGNOSIS — R079 Chest pain, unspecified: Secondary | ICD-10-CM | POA: Diagnosis not present

## 2015-11-15 LAB — GLUCOSE, CAPILLARY
GLUCOSE-CAPILLARY: 95 mg/dL (ref 65–99)
GLUCOSE-CAPILLARY: 92 mg/dL (ref 65–99)
Glucose-Capillary: 98 mg/dL (ref 65–99)

## 2015-11-15 LAB — CBC
HCT: 35.5 % — ABNORMAL LOW (ref 39.0–52.0)
Hemoglobin: 11.5 g/dL — ABNORMAL LOW (ref 13.0–17.0)
MCH: 28.6 pg (ref 26.0–34.0)
MCHC: 32.4 g/dL (ref 30.0–36.0)
MCV: 88.3 fL (ref 78.0–100.0)
PLATELETS: 151 10*3/uL (ref 150–400)
RBC: 4.02 MIL/uL — ABNORMAL LOW (ref 4.22–5.81)
RDW: 14.7 % (ref 11.5–15.5)
WBC: 4 10*3/uL (ref 4.0–10.5)

## 2015-11-15 LAB — BASIC METABOLIC PANEL
ANION GAP: 5 (ref 5–15)
BUN: 7 mg/dL (ref 6–20)
CALCIUM: 8.8 mg/dL — AB (ref 8.9–10.3)
CO2: 26 mmol/L (ref 22–32)
CREATININE: 0.91 mg/dL (ref 0.61–1.24)
Chloride: 107 mmol/L (ref 101–111)
Glucose, Bld: 99 mg/dL (ref 65–99)
Potassium: 3.5 mmol/L (ref 3.5–5.1)
SODIUM: 138 mmol/L (ref 135–145)

## 2015-11-15 LAB — HEPATITIS PANEL, ACUTE
HEP A IGM: NEGATIVE
HEP B S AG: NEGATIVE
Hep B C IgM: NEGATIVE

## 2015-11-15 LAB — HEMOGLOBIN A1C
HEMOGLOBIN A1C: 5.3 % (ref 4.8–5.6)
Mean Plasma Glucose: 105 mg/dL

## 2015-11-15 LAB — ECHOCARDIOGRAM COMPLETE
Height: 70 in
Weight: 3650.82 oz

## 2015-11-15 MED ORDER — POTASSIUM CHLORIDE CRYS ER 20 MEQ PO TBCR
40.0000 meq | EXTENDED_RELEASE_TABLET | Freq: Once | ORAL | Status: AC
  Administered 2015-11-15: 40 meq via ORAL
  Filled 2015-11-15: qty 2

## 2015-11-15 NOTE — Progress Notes (Signed)
PROGRESS NOTE  Dalton [name] [id number] DOB: 12-Mar-1960 DOA: 11/12/2015 PCP: Philis Fendt, MD  HPI/Recap of past 24 hours:  Flat affect, does not want to talk much, NAD, sitter in room    Assessment/Plan: Principal Problem:   Cocaine-induced mood disorder ([place]) Active Problems:   Essential hypertension, benign   Cocaine abuse   Chest pain   Type 2 diabetes mellitus with vascular disease ([place])   History of seizure  1. Chest pain - patient'S chest pain increases on lying down concerning for possible pericarditis given that patient also recently had febrile illness. Appreciate cardiology consult and patient is on colchicine and NSAIDs. 2-D echo result pending cardiac markers negative. UDS + cocaine. Patient is on statins, metoprolol and aspirin and Plavix. 2. Febrile illness - patient has been experiencing fever and chills for last 1 month.  blood cultures no growth RMSF titer  Pending, Lyme titer negative,  Doxycycline started on admission 3. History of seizures on Lamictal and Keppra. Has not had any seizures recently. 4. History of polysubstance abuse - patient states he has not taken cocaine for many months. Though uds + cocaine,  5. noninsulin dependent Diabetes mellitus type 2 - a1c 5.3, home meds metformin held, will keep patient on sliding scale coverage. 6. Anemia - follow CBC. Mild, hgb stable at baseline around 11. 7. Hypertension - continue lopressor and lisinopril.  8. Depression - patient had endorsed suicidal ideas on arrival to the ER. psychiatry consulted 9. AKI; cr normalized   DVT prophylaxis: SCDs. Code Status: Full code.  Family Communication: Discussed with patient.  Disposition Plan:  inpatient psych  Consults called: Cardiology. psych    Procedures:  none  Antibiotics:  doxycycline   Objective: BP 115/70 (BP Location: Left Arm)   Pulse (!) 52   Temp 98 F (36.7 C) (Oral)   Resp 16   Ht 5\' 10"  (1.778 m)   Wt 103.5 kg (228 lb 2.8  oz)   SpO2 96%   BMI 32.74 kg/m   Intake/Output Summary (Last 24 hours) at 11/15/15 0845 Last data filed at 11/15/15 0757  Gross per 24 hour  Intake             1920 ml  Output                0 ml  Net             1920 ml   Filed Weights   11/12/15 2305  Weight: 103.5 kg (228 lb 2.8 oz)    Exam:   General:  NAD, flat affect  Cardiovascular: RRR  Respiratory: CTABL  Abdomen: Soft/ND/NT, positive BS  Musculoskeletal: No Edema  Neuro: aaox3  Data Reviewed: Basic Metabolic Panel:  Recent Labs Lab 11/12/15 1809 11/13/15 0425 11/14/15 0231 11/15/15 0311  NA 145 140 138 138  K 3.8 3.1* 4.0 3.5  CL 110 107 107 107  CO2 25 27 25 26   GLUCOSE 109* 93 98 99  BUN 14 10 9 7   CREATININE 1.46* 1.15 1.01 0.91  CALCIUM 10.2 8.7* 8.4* 8.8*  MG  --   --  1.9  --    Liver Function Tests:  Recent Labs Lab 11/13/15 0425 11/14/15 0231  AST 26 27  ALT 13* 14*  ALKPHOS 41 55  BILITOT 1.3* 0.6  PROT 6.0* 5.7*  ALBUMIN 3.2* 3.2*   No results for input(s): LIPASE, AMYLASE in the last 168 hours. No results for input(s): AMMONIA in the last 168  hours. CBC:  Recent Labs Lab 11/12/15 1809 11/13/15 0425 11/14/15 0231 11/15/15 0311  WBC 10.8* 6.2 3.7* 4.0  NEUTROABS 8.8* 3.4  --   --   HGB 12.4* 10.6* 11.0* 11.5*  HCT 37.2* 32.2* 33.8* 35.5*  MCV 87.1 87.0 88.0 88.3  PLT 197 145* 136* 151   Cardiac Enzymes:    Recent Labs Lab 11/12/15 2241 11/13/15 0425 11/13/15 0913  TROPONINI 0.04* 0.03* <0.03   BNP (last 3 results) No results for input(s): BNP in the last 8760 hours.  ProBNP (last 3 results) No results for input(s): PROBNP in the last 8760 hours.  CBG:  Recent Labs Lab 11/14/15 0622 11/14/15 1106 11/14/15 1715 11/14/15 2059 11/15/15 0610  GLUCAP 93 105* 102* 119* 98    Recent Results (from the past 240 hour(s))  Culture, blood (routine x 2)     Status: None (Preliminary result)   Collection Time: 11/12/15 10:15 PM  Result Value Ref Range  Status   Specimen Description BLOOD RIGHT ARM  Final   Special Requests BOTTLES DRAWN AEROBIC AND ANAEROBIC 5CC  Final   Culture NO GROWTH 2 DAYS  Final   Report Status PENDING  Incomplete  Culture, blood (routine x 2)     Status: None (Preliminary result)   Collection Time: 11/12/15 10:28 PM  Result Value Ref Range Status   Specimen Description BLOOD LEFT ARM  Final   Special Requests IN PEDIATRIC BOTTLE 4CC  Final   Culture NO GROWTH 2 DAYS  Final   Report Status PENDING  Incomplete     Studies: No results found.  Scheduled Meds: . albuterol  2.5 mg Nebulization TID  . alprazolam  2 mg Oral BID  . aspirin  325 mg Oral Daily  . atorvastatin  10 mg Oral q morning - 10a  . budesonide  0.5 mg Inhalation BID  . clopidogrel  75 mg Oral QAC breakfast  . colchicine  0.6 mg Oral BID  . doxycycline (VIBRAMYCIN) IV  100 mg Intravenous BID  . hydrOXYzine  25 mg Oral Q6H  . ibuprofen  200 mg Oral TID  . insulin aspart  0-9 Units Subcutaneous TID WC  . lamoTRIgine  200 mg Oral Daily  . levETIRAcetam  500 mg Oral Q12H  . lisinopril  5 mg Oral Daily  . loratadine  10 mg Oral Daily  . metoprolol tartrate  25 mg Oral BID  . pantoprazole  40 mg Oral Daily  . potassium chloride  40 mEq Oral Once  . sertraline  100 mg Oral Daily  . tamsulosin  0.4 mg Oral QPC breakfast    Continuous Infusions:     Time spent: 81mins  Travaughn Vue MD, PhD  [facility] Pager [phone]. If 7PM-7AM, please contact night-coverage at [email or web address], password [facility] 11/15/2015, 8:45 AM  LOS: 0 days

## 2015-11-16 DIAGNOSIS — F1494 Cocaine use, unspecified with cocaine-induced mood disorder: Secondary | ICD-10-CM | POA: Diagnosis not present

## 2015-11-16 DIAGNOSIS — R079 Chest pain, unspecified: Secondary | ICD-10-CM | POA: Diagnosis not present

## 2015-11-16 LAB — BASIC METABOLIC PANEL
Anion gap: 7 (ref 5–15)
BUN: 9 mg/dL (ref 6–20)
CALCIUM: 9.1 mg/dL (ref 8.9–10.3)
CO2: 28 mmol/L (ref 22–32)
CREATININE: 0.97 mg/dL (ref 0.61–1.24)
Chloride: 104 mmol/L (ref 101–111)
GFR calc non Af Amer: >60 mL/min (ref >60)
Glucose, Bld: 86 mg/dL (ref 65–99)
Potassium: 4.1 mmol/L (ref 3.5–5.1)
SODIUM: 139 mmol/L (ref 135–145)

## 2015-11-16 LAB — TSH: TSH: 1.539 u[IU]/mL (ref 0.350–4.500)

## 2015-11-16 LAB — CBC
HCT: 35.9 % — ABNORMAL LOW (ref 39.0–52.0)
Hemoglobin: 11.9 g/dL — ABNORMAL LOW (ref 13.0–17.0)
MCH: 28.6 pg (ref 26.0–34.0)
MCHC: 33.1 g/dL (ref 30.0–36.0)
MCV: 86.3 fL (ref 78.0–100.0)
Platelets: 181 10*3/uL (ref 150–400)
RBC: 4.16 MIL/uL — ABNORMAL LOW (ref 4.22–5.81)
RDW: 14.4 % (ref 11.5–15.5)
WBC: 3.3 10*3/uL — ABNORMAL LOW (ref 4.0–10.5)

## 2015-11-16 LAB — GLUCOSE, CAPILLARY
GLUCOSE-CAPILLARY: 99 mg/dL (ref 65–99)
GLUCOSE-CAPILLARY: 140 mg/dL — AB (ref 65–99)
GLUCOSE-CAPILLARY: 123 mg/dL — AB (ref 65–99)
Glucose-Capillary: 106 mg/dL — ABNORMAL HIGH (ref 65–99)

## 2015-11-16 LAB — RPR: RPR Ser Ql: NONREACTIVE

## 2015-11-16 LAB — URIC ACID: Uric Acid, Serum: 4.2 mg/dL — ABNORMAL LOW (ref 4.4–7.6)

## 2015-11-16 LAB — MAGNESIUM: MAGNESIUM: 1.8 mg/dL (ref 1.7–2.4)

## 2015-11-16 MED ORDER — METOPROLOL TARTRATE 12.5 MG HALF TABLET
12.5000 mg | ORAL_TABLET | Freq: Two times a day (BID) | ORAL | Status: DC
  Administered 2015-11-17 – 2015-11-20 (×4): 12.5 mg via ORAL
  Filled 2015-11-16 (×6): qty 1

## 2015-11-16 MED ORDER — DOXYCYCLINE HYCLATE 100 MG PO TABS
100.0000 mg | ORAL_TABLET | Freq: Two times a day (BID) | ORAL | Status: DC
  Administered 2015-11-16 – 2015-11-20 (×8): 100 mg via ORAL
  Filled 2015-11-16 (×8): qty 1

## 2015-11-16 NOTE — Progress Notes (Signed)
PROGRESS NOTE  Dalton [name] [id number] DOB: 01-02-1961 DOA: 11/12/2015 PCP: Philis Fendt, MD  HPI/Recap of past 24 hours:  Flat affect, does not want to talk much, NAD, sitter in room    Assessment/Plan: Principal Problem:   Cocaine-induced mood disorder ([place]) Active Problems:   Essential hypertension, benign   Cocaine abuse   Chest pain   Type 2 diabetes mellitus with vascular disease ([place])   History of seizure  1. Chest pain - patient'S chest pain increases on lying down concerning for possible pericarditis given that patient also recently had febrile illness. Appreciate cardiology consult and patient is on colchicine and NSAIDs from admission which is stopped on 9/24 given echo pericardium unremarkable. 2-D echo lvef 55-60%, no wall motion abnormality, +grade II dysfucnition, pericardium unremarkable. cardiac markers negative. UDS + cocaine. Patient is on statins, metoprolol and aspirin and Plavix. 2. Febrile illness - patient has been experiencing fever and chills for last 1 month.  blood cultures no growth RMSF titer  Pending, Lyme titer negative, IV Doxycycline started on admission, no fever since being admitted, will change to oral doxycycline 3. History of seizures on Lamictal and Keppra. Has not had any seizures recently. 4. History of polysubstance abuse - patient states he has not taken cocaine for many months. Though uds + cocaine,  5. noninsulin dependent Diabetes mellitus type 2 - a1c 5.3, home meds metformin held, will keep patient on sliding scale coverage. 6. Anemia - follow CBC. Mild, hgb stable at baseline around 11. 7. Hypertension - continue lopressor and lisinopril.  8. Depression - patient had endorsed suicidal ideas on arrival to the ER. psychiatry consulted 9. AKI; cr normalized  patient is medically cleared to be discharged to inpatient psych   DVT prophylaxis: SCDs. Code Status: Full code.  Family Communication: Discussed with patient.    Disposition Plan:  inpatient psych  Consults called: Cardiology. psych    Procedures:  none  Antibiotics:  doxycycline   Objective: BP 114/66   Pulse 69   Temp 98 F (36.7 C) (Oral)   Resp 18   Ht 5\' 10"  (1.778 m)   Wt 103.5 kg (228 lb 2.8 oz)   SpO2 98%   BMI 32.74 kg/m   Intake/Output Summary (Last 24 hours) at 11/16/15 1203 Last data filed at 11/16/15 1008  Gross per 24 hour  Intake             1080 ml  Output                0 ml  Net             1080 ml   Filed Weights   11/12/15 2305  Weight: 103.5 kg (228 lb 2.8 oz)    Exam:   General:  NAD, flat affect  Cardiovascular: RRR  Respiratory: CTABL  Abdomen: Soft/ND/NT, positive BS  Musculoskeletal: No Edema  Neuro: aaox3  Data Reviewed: Basic Metabolic Panel:  Recent Labs Lab 11/12/15 1809 11/13/15 0425 11/14/15 0231 11/15/15 0311 11/16/15 0316  NA 145 140 138 138 139  K 3.8 3.1* 4.0 3.5 4.1  CL 110 107 107 107 104  CO2 25 27 25 26 28   GLUCOSE 109* 93 98 99 86  BUN 14 10 9 7 9   CREATININE 1.46* 1.15 1.01 0.91 0.97  CALCIUM 10.2 8.7* 8.4* 8.8* 9.1  MG  --   --  1.9  --  1.8   Liver Function Tests:  Recent Labs Lab 11/13/15 0425  11/14/15 0231  AST 26 27  ALT 13* 14*  ALKPHOS 41 55  BILITOT 1.3* 0.6  PROT 6.0* 5.7*  ALBUMIN 3.2* 3.2*   No results for input(s): LIPASE, AMYLASE in the last 168 hours. No results for input(s): AMMONIA in the last 168 hours. CBC:  Recent Labs Lab 11/12/15 1809 11/13/15 0425 11/14/15 0231 11/15/15 0311 11/16/15 0316  WBC 10.8* 6.2 3.7* 4.0 3.3*  NEUTROABS 8.8* 3.4  --   --   --   HGB 12.4* 10.6* 11.0* 11.5* 11.9*  HCT 37.2* 32.2* 33.8* 35.5* 35.9*  MCV 87.1 87.0 88.0 88.3 86.3  PLT 197 145* 136* 151 181   Cardiac Enzymes:    Recent Labs Lab 11/12/15 2241 11/13/15 0425 11/13/15 0913  TROPONINI 0.04* 0.03* <0.03   BNP (last 3 results) No results for input(s): BNP in the last 8760 hours.  ProBNP (last 3 results) No results for  input(s): PROBNP in the last 8760 hours.  CBG:  Recent Labs Lab 11/15/15 0610 11/15/15 1121 11/15/15 2039 11/16/15 0635 11/16/15 1135  GLUCAP 98 95 92 99 140*    Recent Results (from the past 240 hour(s))  Culture, blood (routine x 2)     Status: None (Preliminary result)   Collection Time: 11/12/15 10:15 PM  Result Value Ref Range Status   Specimen Description BLOOD RIGHT ARM  Final   Special Requests BOTTLES DRAWN AEROBIC AND ANAEROBIC 5CC  Final   Culture NO GROWTH 3 DAYS  Final   Report Status PENDING  Incomplete  Culture, blood (routine x 2)     Status: None (Preliminary result)   Collection Time: 11/12/15 10:28 PM  Result Value Ref Range Status   Specimen Description BLOOD LEFT ARM  Final   Special Requests IN PEDIATRIC BOTTLE 4CC  Final   Culture NO GROWTH 3 DAYS  Final   Report Status PENDING  Incomplete     Studies: No results found.  Scheduled Meds: . alprazolam  2 mg Oral BID  . aspirin  325 mg Oral Daily  . atorvastatin  10 mg Oral q morning - 10a  . budesonide  0.5 mg Inhalation BID  . clopidogrel  75 mg Oral QAC breakfast  . doxycycline  100 mg Oral Q12H  . hydrOXYzine  25 mg Oral Q6H  . insulin aspart  0-9 Units Subcutaneous TID WC  . lamoTRIgine  200 mg Oral Daily  . levETIRAcetam  500 mg Oral Q12H  . lisinopril  5 mg Oral Daily  . loratadine  10 mg Oral Daily  . metoprolol tartrate  25 mg Oral BID  . pantoprazole  40 mg Oral Daily  . sertraline  100 mg Oral Daily  . tamsulosin  0.4 mg Oral QPC breakfast    Continuous Infusions:     Time spent: 14mins  Jabori Henegar MD, PhD  [facility] Pager [phone]. If 7PM-7AM, please contact night-coverage at [email or web address], password [facility] 11/16/2015, 12:03 PM  LOS: 0 days

## 2015-11-16 NOTE — [facility] Counselor (Signed)
Per notes, Pt is now medically cleared.  Advised RN [facility] who will check for bed availability at [facility] on 11/17/15.

## 2015-11-17 DIAGNOSIS — F1494 Cocaine use, unspecified with cocaine-induced mood disorder: Secondary | ICD-10-CM | POA: Diagnosis not present

## 2015-11-17 DIAGNOSIS — R079 Chest pain, unspecified: Secondary | ICD-10-CM | POA: Diagnosis not present

## 2015-11-17 LAB — CBC
HCT: 35.8 % — ABNORMAL LOW (ref 39.0–52.0)
HEMOGLOBIN: 11.7 g/dL — AB (ref 13.0–17.0)
MCH: 28.6 pg (ref 26.0–34.0)
MCHC: 32.7 g/dL (ref 30.0–36.0)
MCV: 87.5 fL (ref 78.0–100.0)
PLATELETS: 182 10*3/uL (ref 150–400)
RBC: 4.09 MIL/uL — ABNORMAL LOW (ref 4.22–5.81)
RDW: 14.3 % (ref 11.5–15.5)
WBC: 4 10*3/uL (ref 4.0–10.5)

## 2015-11-17 LAB — BASIC METABOLIC PANEL
Anion gap: 5 (ref 5–15)
BUN: 14 mg/dL (ref 6–20)
CALCIUM: 8.8 mg/dL — AB (ref 8.9–10.3)
CHLORIDE: 104 mmol/L (ref 101–111)
CO2: 29 mmol/L (ref 22–32)
CREATININE: 1.12 mg/dL (ref 0.61–1.24)
Glucose, Bld: 121 mg/dL — ABNORMAL HIGH (ref 65–99)
Potassium: 3.4 mmol/L — ABNORMAL LOW (ref 3.5–5.1)
SODIUM: 138 mmol/L (ref 135–145)

## 2015-11-17 LAB — CULTURE, BLOOD (ROUTINE X 2)
Culture: NO GROWTH
Culture: NO GROWTH

## 2015-11-17 LAB — GLUCOSE, CAPILLARY
GLUCOSE-CAPILLARY: 81 mg/dL (ref 65–99)
GLUCOSE-CAPILLARY: 82 mg/dL (ref 65–99)
GLUCOSE-CAPILLARY: 105 mg/dL — AB (ref 65–99)
Glucose-Capillary: 89 mg/dL (ref 65–99)
Glucose-Capillary: 76 mg/dL (ref 65–99)

## 2015-11-17 LAB — RMSF, IGG, IFA: RMSF, IGG, IFA: <1:64 {titer}

## 2015-11-17 LAB — ROCKY MTN SPOTTED FVR ABS PNL(IGG+IGM)
RMSF IGG: POSITIVE — AB
RMSF IGM: 0.67 {index} (ref 0.00–0.89)

## 2015-11-17 MED ORDER — POTASSIUM CHLORIDE CRYS ER 20 MEQ PO TBCR
40.0000 meq | EXTENDED_RELEASE_TABLET | Freq: Once | ORAL | Status: AC
  Administered 2015-11-17: 40 meq via ORAL
  Filled 2015-11-17: qty 2

## 2015-11-17 MED ORDER — FUROSEMIDE 40 MG PO TABS: 40.0000 mg | ORAL_TABLET | Freq: Every day | ORAL | 0 refills | Status: DC | PRN

## 2015-11-17 MED ORDER — DOXYCYCLINE HYCLATE 100 MG PO TABS: 100.0000 mg | ORAL_TABLET | Freq: Two times a day (BID) | ORAL | 0 refills | Status: DC

## 2015-11-17 MED ORDER — METFORMIN HCL 1000 MG PO TABS: 500.0000 mg | ORAL_TABLET | Freq: Every day | ORAL | Status: DC

## 2015-11-17 MED ORDER — POTASSIUM CHLORIDE CRYS ER 20 MEQ PO TBCR: 20.0000 meq | EXTENDED_RELEASE_TABLET | Freq: Every day | ORAL | 0 refills | Status: DC

## 2015-11-17 MED ORDER — METOPROLOL TARTRATE 25 MG PO TABS: 12.5000 mg | ORAL_TABLET | Freq: Two times a day (BID) | ORAL | Status: DC

## 2015-11-17 MED ORDER — ASPIRIN EC 81 MG PO TBEC: 81.0000 mg | DELAYED_RELEASE_TABLET | Freq: Every day | ORAL | 0 refills | Status: DC

## 2015-11-17 MED ORDER — LISINOPRIL 2.5 MG PO TABS: 2.5000 mg | ORAL_TABLET | Freq: Every day | ORAL | 0 refills | Status: DC

## 2015-11-17 NOTE — Discharge Summary (Signed)
Discharge Summary  Dalton [name] [id number] DOB: 08/23/1960  PCP: Dalton Fendt, MD  Admit date: 11/12/2015 Discharge date: 11/17/2015  Time spent: <31mins   Patient is to discharged to inpatient psych facility  Recommendations for Outpatient Follow-up:  1. F/u with PMD within a week  for hospital discharge follow up, repeat cbc/bmp at follow up 2. F/u with cardiology, s/o CABG in 2002  Discharge Diagnoses:  Active Hospital Problems   Diagnosis Date Noted  . Cocaine-induced mood disorder ([place]) 01/26/2015  . Chest pain 11/12/2015  . Type 2 diabetes mellitus with vascular disease ([place]) 11/12/2015  . History of seizure 11/12/2015  . Cocaine abuse 01/26/2015  . Essential hypertension, benign 08/31/2011    Resolved Hospital Problems   Diagnosis Date Noted Date Resolved  No resolved problems to display.    Discharge Condition: stable  Diet recommendation: heart healthy/carb modified  Filed Weights   11/12/15 2305  Weight: 103.5 kg (228 lb 2.8 oz)    History of present illness:  Chief Complaint: Chest pain.  HPI: Dalton [name] is a 55 y.o. male with CAD status post CABG, diabetes mellitus type 2, history of seizures, previous history of drug abuse presents to the ER because of chest pain. Patient states he has been experiencing subjective feeling of fever chills last one month. He has been to in ER recently and was prescribed doxycycline for possible tick-related disease which patient is yet to start taking presents to the ER because of chest pain. Patient has been experiencing chest pain last 2 weeks which increases on lying down. Denies any associated shortness of breath nausea vomiting diarrhea. The patient is mildly febrile. Chest x-ray and EKG unremarkable. On-call cardiologist was consulted and felt that patient may be having pericarditis and admitted for further observation and management.   ED Course: Patient was started on ibuprofen and colchicine for  possible pericarditis.  Hospital Course:  Principal Problem:   Cocaine-induced mood disorder ([place]) Active Problems:   Essential hypertension, benign   Cocaine abuse   Chest pain   Type 2 diabetes mellitus with vascular disease ([place])   History of seizure  1. Chest pain- patient'Schest pain increases on lying down initially concerning for possible pericarditis given that patient also recently had febrile illness. Appreciate cardiology consult and patient was started on colchicine and NSAIDs from admission which is stopped on 9/24 given echo pericardium unremarkable. 2-D echo lvef 55-60%, no wall motion abnormality, +grade II dysfucnition, pericardium unremarkable. cardiac markers negative. UDS + cocaine. Patient is on statins, metoprolol and aspirin and Plavix. 2. Febrile illness- patient reports has been experiencing fever and chills for last 1 month.  blood cultures no growth RMSFtiter  Pending, Lyme titer negative, IV Doxycycline started on admission, no fever since being admitted, will change to oral doxycycline to finish total 14days treatment for presumed tick born disease. Patient does not have fever in the hospital 3. History of seizureson Lamictal and Keppra. Has not had any seizures recently. 4. History of polysubstance abuse- patient states he has not taken cocaine for many months. Though uds + cocaine,  5. noninsulin dependent Diabetes mellitus type 2 -a1c 5.3, home meds metformin held in the hospital, restarted at a lower dose at discharge. 6. Anemia- follow CBC. Mild, hgb stable at baseline around 11. 7. Hypertension-  lopressor and lisinopril dose reduced, lasix changed to prn for edema, no edema at discharge. 8. Depression- patient hadendorsed suicidal ideas on arrival to the ER. psychiatry consulted who recommended inpatient psych  placement 9. AKI; cr normalized 10. CAD s/p CABG in 2002, continue follow up with cardiology  patient is medically cleared to be  discharged to inpatient psych   DVT prophylaxis:SCDs. Code Status:Full code. Family Communication:Discussed with patient. Disposition Plan: inpatient psych  Consults called:Cardiology. psych   Procedures:  none  Antibiotics:  doxycycline   Discharge Exam: BP 110/66 (BP Location: Right Arm)   Pulse (!) 55   Temp 98 F (36.7 C) (Oral)   Resp (!) 21   Ht 5\' 10"  (1.778 m)   Wt 103.5 kg (228 lb 2.8 oz)   SpO2 100%   BMI 32.74 kg/m     General:  NAD, flat affect  Cardiovascular: RRR  Respiratory: CTABL  Abdomen: Soft/ND/NT, positive BS  Musculoskeletal: No Edema  Neuro: aaox3   Discharge Instructions You were cared for by a hospitalist during your hospital stay. If you have any questions about your discharge medications or the care you received while you were in the hospital after you are discharged, you can call the unit and asked to speak with the hospitalist on call if the hospitalist that took care of you is not available. Once you are discharged, your primary care physician will handle any further medical issues. Please note that NO REFILLS for any discharge medications will be authorized once you are discharged, as it is imperative that you return to your primary care physician (or establish a relationship with a primary care physician if you do not have one) for your aftercare needs so that they can reassess your need for medications and monitor your lab values.  Discharge Instructions    Diet - low sodium heart healthy    Complete by:  As directed    Carb modified   Increase activity slowly    Complete by:  As directed        Medication List    STOP taking these medications   aspirin 325 MG tablet Replaced by:  aspirin EC 81 MG tablet   hydrochlorothiazide 25 MG tablet Commonly known as:  HYDRODIURIL     TAKE these medications   alprazolam 2 MG tablet Commonly known as:  XANAX Take 2 mg by mouth 2 (two) times daily.   aspirin EC  81 MG tablet Take 1 tablet (81 mg total) by mouth daily. Replaces:  aspirin 325 MG tablet   atorvastatin 10 MG tablet Commonly known as:  LIPITOR Take 1 tablet (10 mg total) by mouth every morning.   beclomethasone 80 MCG/ACT inhaler Commonly known as:  QVAR Inhale 2 puffs into the lungs 2 (two) times daily. What changed:  when to take this  reasons to take this   cetirizine 10 MG tablet Commonly known as:  ZYRTEC Take 10 mg by mouth daily as needed for allergies or rhinitis. Reported on 04/16/2015   clopidogrel 75 MG tablet Commonly known as:  PLAVIX Take 75 mg by mouth daily before breakfast.   diclofenac sodium 1 % Gel Commonly known as:  VOLTAREN Place 1 application onto the skin 4 (four) times daily as needed.   doxycycline 100 MG tablet Commonly known as:  VIBRA-TABS Take 1 tablet (100 mg total) by mouth every 12 (twelve) hours.   esomeprazole 40 MG capsule Commonly known as:  NEXIUM Take 1 capsule (40 mg total) by mouth 2 (two) times daily.   fluticasone 50 MCG/ACT nasal spray Commonly known as:  FLONASE Place 2 sprays into both nostrils daily as needed for allergies.   furosemide 40  MG tablet Commonly known as:  LASIX Take 1 tablet (40 mg total) by mouth daily as needed for fluid or edema. What changed:  when to take this  reasons to take this   hydrOXYzine 25 MG tablet Commonly known as:  ATARAX/VISTARIL Take 1 tablet (25 mg total) by mouth every 6 (six) hours.   ipratropium 0.02 % nebulizer solution Commonly known as:  ATROVENT Take 0.5 mg by nebulization every 6 (six) hours as needed for wheezing or shortness of breath.   lamoTRIgine 200 MG tablet Commonly known as:  LAMICTAL Take 1 tablet (200 mg total) by mouth daily.   levETIRAcetam 500 MG tablet Commonly known as:  KEPPRA Take 1 tablet (500 mg total) by mouth every 12 (twelve) hours.   lisinopril 2.5 MG tablet Commonly known as:  ZESTRIL Take 1 tablet (2.5 mg total) by mouth  daily. What changed:  medication strength  how much to take  additional instructions   LYRICA 100 MG capsule Generic drug:  pregabalin Take 100-200 mg by mouth 3 (three) times daily as needed (for shooting pains in feet).   metFORMIN 1000 MG tablet Commonly known as:  GLUCOPHAGE Take 0.5 tablets (500 mg total) by mouth daily with breakfast. What changed:  how much to take   metoprolol tartrate 25 MG tablet Commonly known as:  LOPRESSOR Take 0.5 tablets (12.5 mg total) by mouth 2 (two) times daily. What changed:  how much to take   oxyCODONE-acetaminophen 10-325 MG tablet Commonly known as:  PERCOCET Take 1 tablet by mouth every 6 (six) hours as needed for pain.   potassium chloride SA 20 MEQ tablet Commonly known as:  K-DUR,KLOR-CON Take 1 tablet (20 mEq total) by mouth daily. What changed:  when to take this   albuterol (2.5 MG/3ML) 0.083% nebulizer solution Commonly known as:  PROVENTIL Take 3 mLs (2.5 mg total) by nebulization 2 (two) times daily. What changed:  when to take this   PROAIR HFA 108 (90 Base) MCG/ACT inhaler Generic drug:  albuterol Inhale 2 puffs into the lungs every 6 (six) hours as needed for wheezing (for shortness of breath). What changed:  Another medication with the same name was changed. Make sure you understand how and when to take each.   sertraline 100 MG tablet Commonly known as:  ZOLOFT Take 1 tablet (100 mg total) by mouth daily.   tamsulosin 0.4 MG Caps capsule Commonly known as:  FLOMAX Take 1 capsule (0.4 mg total) by mouth daily after breakfast.      Allergies  Allergen Reactions  . Gabapentin Hives  . Ibuprofen Other (See Comments)    REACTION:Stomach  upset  . Zolpidem Tartrate Other (See Comments)    REACTION: Hallucinations   . Naproxen Other (See Comments)    REACTION: unknown   Follow-up Information    Dalton Fendt, MD Follow up in 1 month(s).   Specialty:  Internal Medicine Contact information: [place] [address] [place] III, MD Follow up in 1 month(s).   Specialty:  Cardiology Why:  h/o CABG Contact information: [id number] [address] [place] [address] [phone]            The results of significant diagnostics from this hospitalization (including imaging, microbiology, ancillary and laboratory) are listed below for reference.    Significant Diagnostic Studies: Dg Chest 2 View  Result Date: 11/12/2015 CLINICAL DATA:  Chest pain and shortness of  breath for several weeks. History of hypertension, CHF. EXAM: CHEST  2 VIEW COMPARISON:  Chest radiograph October 17, 2015 FINDINGS: Cardiomediastinal silhouette is normal. Status post median sternotomy for CABG. Fractured most caudal median sternotomy wire. LEFT subclavian stent. No pleural effusions or focal consolidations. Trachea projects midline and there is no pneumothorax. Soft tissue planes and included osseous structures are non-suspicious. IMPRESSION: No acute cardiopulmonary process. Electronically Signed   By: Elon Alas M.D.   On: 11/12/2015 18:48    Microbiology: Recent Results (from the past 240 hour(s))  Culture, blood (routine x 2)     Status: None (Preliminary result)   Collection Time: 11/12/15 10:15 PM  Result Value Ref Range Status   Specimen Description BLOOD RIGHT ARM  Final   Special Requests BOTTLES DRAWN AEROBIC AND ANAEROBIC 5CC  Final   Culture NO GROWTH 4 DAYS  Final   Report Status PENDING  Incomplete  Culture, blood (routine x 2)     Status: None (Preliminary result)   Collection Time: 11/12/15 10:28 PM  Result Value Ref Range Status   Specimen Description BLOOD LEFT ARM  Final   Special Requests IN PEDIATRIC BOTTLE 4CC  Final   Culture NO GROWTH 4 DAYS  Final   Report Status PENDING  Incomplete     Labs: Basic Metabolic Panel:  Recent Labs Lab 11/13/15 0425 11/14/15 0231 11/15/15 0311 11/16/15 0316 11/17/15 0241   NA 140 138 138 139 138  K 3.1* 4.0 3.5 4.1 3.4*  CL 107 107 107 104 104  CO2 27 25 26 28 29   GLUCOSE 93 98 99 86 121*  BUN 10 9 7 9 14   CREATININE 1.15 1.01 0.91 0.97 1.12  CALCIUM 8.7* 8.4* 8.8* 9.1 8.8*  MG  --  1.9  --  1.8  --    Liver Function Tests:  Recent Labs Lab 11/13/15 0425 11/14/15 0231  AST 26 27  ALT 13* 14*  ALKPHOS 41 55  BILITOT 1.3* 0.6  PROT 6.0* 5.7*  ALBUMIN 3.2* 3.2*   No results for input(s): LIPASE, AMYLASE in the last 168 hours. No results for input(s): AMMONIA in the last 168 hours. CBC:  Recent Labs Lab 11/12/15 1809 11/13/15 0425 11/14/15 0231 11/15/15 0311 11/16/15 0316 11/17/15 0241  WBC 10.8* 6.2 3.7* 4.0 3.3* 4.0  NEUTROABS 8.8* 3.4  --   --   --   --   HGB 12.4* 10.6* 11.0* 11.5* 11.9* 11.7*  HCT 37.2* 32.2* 33.8* 35.5* 35.9* 35.8*  MCV 87.1 87.0 88.0 88.3 86.3 87.5  PLT 197 145* 136* 151 181 182   Cardiac Enzymes:  Recent Labs Lab 11/12/15 2241 11/13/15 0425 11/13/15 0913  TROPONINI 0.04* 0.03* <0.03   BNP: BNP (last 3 results) No results for input(s): BNP in the last 8760 hours.  ProBNP (last 3 results) No results for input(s): PROBNP in the last 8760 hours.  CBG:  Recent Labs Lab 11/16/15 1135 11/16/15 1615 11/16/15 2113 11/17/15 0656 11/17/15 1108  GLUCAP 140* 106* 123* 82 89       Signed:  Alexzander Dolinger MD, PhD  [facility] 11/17/2015, 12:03 PM

## 2015-11-17 NOTE — Progress Notes (Signed)
PROGRESS NOTE  Dalton [name] [id number] DOB: Sep 22, 1960 DOA: 11/12/2015 PCP: Philis Fendt, MD  HPI/Recap of past 24 hours:  Flat affect, does not want to talk much, NAD, sitter in room Medically cleared to be discharged to psych facility, awaiting for bed    Assessment/Plan: Principal Problem:   Cocaine-induced mood disorder ([place]) Active Problems:   Essential hypertension, benign   Cocaine abuse   Chest pain   Type 2 diabetes mellitus with vascular disease ([place])   History of seizure   1. Chest pain- patient'Schest pain increases on lying down initially concerning for possible pericarditis given that patient also recently had febrile illness. Appreciate cardiology consult and patient was started on colchicine and NSAIDs from admission which is stopped on 9/24 given echo pericardium unremarkable. 2-D echo lvef 55-60%, no wall motion abnormality, +grade II dysfucnition, pericardium unremarkable. cardiac markers negative. UDS + cocaine. Patient is on statins, metoprolol and aspirin and Plavix. 2. Febrile illness- patient reports has been experiencing fever and chills for last 1 month. blood cultures no growth RMSFtiter Pending, Lyme titer negative, IVDoxycycline started on admission, no fever since being admitted, will change to oral doxycycline to finish total 14days treatment for presumed tick born disease. Patient does not have fever in the hospital 3. History of seizureson Lamictal and Keppra. Has not had any seizures recently. 4. History of polysubstance abuse- patient states he has not taken cocaine for many months. Though uds + cocaine,  5. noninsulin dependent Diabetes mellitus type 2 -a1c 5.3, home meds metformin held in the hospital, plan to restarted at a lower dose at discharge. 6. Anemia- follow CBC. Mild, hgb stable at baseline around 11. 7. Hypertension-  lopressor and lisinopril dose reduced, lasix changed to prn for edema, no edema at  discharge. 8. Depression- patient hadendorsed suicidal ideas on arrival to the ER. psychiatry consulted who recommended inpatient psych placement 9. AKI; cr normalized                                10.  CAD s/p CABG in 2002, continue follow up with cardiology  patient is medically cleared to be discharged to inpatient psych   DVT prophylaxis: SCDs. Code Status: Full code.  Family Communication: Discussed with patient.  Disposition Plan:  inpatient psych  Consults called: Cardiology. psych    Procedures:  none  Antibiotics:  doxycycline   Objective: BP 112/62 (BP Location: Right Arm)   Pulse (!) 54   Temp 97.4 F (36.3 C) (Oral)   Resp 17   Ht 5\' 10"  (1.778 m)   Wt 103.5 kg (228 lb 2.8 oz)   SpO2 97%   BMI 32.74 kg/m   Intake/Output Summary (Last 24 hours) at 11/17/15 1749 Last data filed at 11/17/15 0800  Gross per 24 hour  Intake              120 ml  Output              300 ml  Net             -180 ml   Filed Weights   11/12/15 2305  Weight: 103.5 kg (228 lb 2.8 oz)    Exam:   General:  NAD, flat affect  Cardiovascular: RRR  Respiratory: CTABL  Abdomen: Soft/ND/NT, positive BS  Musculoskeletal: No Edema  Neuro: aaox3  Data Reviewed: Basic Metabolic Panel:  Recent Labs Lab 11/13/15 0425 11/14/15 0231  11/15/15 0311 11/16/15 0316 11/17/15 0241  NA 140 138 138 139 138  K 3.1* 4.0 3.5 4.1 3.4*  CL 107 107 107 104 104  CO2 27 25 26 28 29   GLUCOSE 93 98 99 86 121*  BUN 10 9 7 9 14   CREATININE 1.15 1.01 0.91 0.97 1.12  CALCIUM 8.7* 8.4* 8.8* 9.1 8.8*  MG  --  1.9  --  1.8  --    Liver Function Tests:  Recent Labs Lab 11/13/15 0425 11/14/15 0231  AST 26 27  ALT 13* 14*  ALKPHOS 41 55  BILITOT 1.3* 0.6  PROT 6.0* 5.7*  ALBUMIN 3.2* 3.2*   No results for input(s): LIPASE, AMYLASE in the last 168 hours. No results for input(s): AMMONIA in the last 168 hours. CBC:  Recent Labs Lab 11/12/15 1809 11/13/15 0425 11/14/15 0231  11/15/15 0311 11/16/15 0316 11/17/15 0241  WBC 10.8* 6.2 3.7* 4.0 3.3* 4.0  NEUTROABS 8.8* 3.4  --   --   --   --   HGB 12.4* 10.6* 11.0* 11.5* 11.9* 11.7*  HCT 37.2* 32.2* 33.8* 35.5* 35.9* 35.8*  MCV 87.1 87.0 88.0 88.3 86.3 87.5  PLT 197 145* 136* 151 181 182   Cardiac Enzymes:    Recent Labs Lab 11/12/15 2241 11/13/15 0425 11/13/15 0913  TROPONINI 0.04* 0.03* <0.03   BNP (last 3 results) No results for input(s): BNP in the last 8760 hours.  ProBNP (last 3 results) No results for input(s): PROBNP in the last 8760 hours.  CBG:  Recent Labs Lab 11/16/15 1615 11/16/15 2113 11/17/15 0656 11/17/15 1108 11/17/15 1647  GLUCAP 106* 123* 82 89 76    Recent Results (from the past 240 hour(s))  Culture, blood (routine x 2)     Status: None   Collection Time: 11/12/15 10:15 PM  Result Value Ref Range Status   Specimen Description BLOOD RIGHT ARM  Final   Special Requests BOTTLES DRAWN AEROBIC AND ANAEROBIC 5CC  Final   Culture NO GROWTH 5 DAYS  Final   Report Status 11/17/2015 FINAL  Final  Culture, blood (routine x 2)     Status: None   Collection Time: 11/12/15 10:28 PM  Result Value Ref Range Status   Specimen Description BLOOD LEFT ARM  Final   Special Requests IN PEDIATRIC BOTTLE 4CC  Final   Culture NO GROWTH 5 DAYS  Final   Report Status 11/17/2015 FINAL  Final     Studies: No results found.  Scheduled Meds: . alprazolam  2 mg Oral BID  . aspirin  325 mg Oral Daily  . atorvastatin  10 mg Oral q morning - 10a  . budesonide  0.5 mg Inhalation BID  . clopidogrel  75 mg Oral QAC breakfast  . doxycycline  100 mg Oral Q12H  . hydrOXYzine  25 mg Oral Q6H  . insulin aspart  0-9 Units Subcutaneous TID WC  . lamoTRIgine  200 mg Oral Daily  . levETIRAcetam  500 mg Oral Q12H  . lisinopril  5 mg Oral Daily  . loratadine  10 mg Oral Daily  . metoprolol tartrate  12.5 mg Oral BID  . pantoprazole  40 mg Oral Daily  . sertraline  100 mg Oral Daily  . tamsulosin   0.4 mg Oral QPC breakfast    Continuous Infusions:     Time spent: 40mins  Vernal Hritz MD, PhD  [facility] Pager [phone]. If 7PM-7AM, please contact night-coverage at [email or web address], password [facility] 11/17/2015, 5:49 PM  LOS:  0 days

## 2015-11-18 ENCOUNTER — Encounter (HOSPITAL_COMMUNITY): Payer: Self-pay | Admitting: General Practice

## 2015-11-18 DIAGNOSIS — R079 Chest pain, unspecified: Secondary | ICD-10-CM | POA: Diagnosis not present

## 2015-11-18 LAB — GLUCOSE, CAPILLARY
GLUCOSE-CAPILLARY: 80 mg/dL (ref 65–99)
GLUCOSE-CAPILLARY: 76 mg/dL (ref 65–99)
Glucose-Capillary: 101 mg/dL — ABNORMAL HIGH (ref 65–99)
Glucose-Capillary: 98 mg/dL (ref 65–99)

## 2015-11-18 NOTE — Clinical Social Work Note (Signed)
CSW called [facility] and [facility] for inpatient psychiatric placement. CSW also faxed [facility]. CSW continuing to follow for discharge needs.   [employer], LCSW [phone]

## 2015-11-18 NOTE — Progress Notes (Signed)
Patient is medically cleared to inpatient psych units, orders in , d/c summary done, pended in incomplete note session.

## 2015-11-19 DIAGNOSIS — F1494 Cocaine use, unspecified with cocaine-induced mood disorder: Secondary | ICD-10-CM | POA: Diagnosis not present

## 2015-11-19 DIAGNOSIS — R079 Chest pain, unspecified: Secondary | ICD-10-CM | POA: Diagnosis not present

## 2015-11-19 LAB — GLUCOSE, CAPILLARY
GLUCOSE-CAPILLARY: 86 mg/dL (ref 65–99)
GLUCOSE-CAPILLARY: 109 mg/dL — AB (ref 65–99)
GLUCOSE-CAPILLARY: 110 mg/dL — AB (ref 65–99)
GLUCOSE-CAPILLARY: 126 mg/dL — AB (ref 65–99)

## 2015-11-19 MED ORDER — ENSURE ENLIVE PO LIQD
237.0000 mL | Freq: Two times a day (BID) | ORAL | Status: DC
  Administered 2015-11-20: 237 mL via ORAL

## 2015-11-19 NOTE — Clinical Social Work Note (Signed)
Patient has been recommended for inpatient psychiatric hospitalization.  Patient has been referred out to facilities with no success at this time.  CSW spoke with [place] at [facility] who states she will review patient for appropriateness.  CSW also requested re-consult.  Per chart review, patient was last seen by psychiatry on 11/13/2015 and will need a new evaluation prior to placement.  Re-consult for determination of continued need for inpatient psychiatric hospitalization.    Nonnie Done, MSW, LCSW  ([id number]) [phone]  Licensed Clinical Social Worker

## 2015-11-19 NOTE — Progress Notes (Signed)
PROGRESS NOTE  Dalton [name] [id number] DOB: 20-Nov-1960 DOA: 11/12/2015 PCP: Philis Fendt, MD  HPI/Recap of past 24 hours: No new complaints. Report having BM.      Assessment/Plan: Principal Problem:   Cocaine-induced mood disorder (HCC) Active Problems:   Essential hypertension, benign   Cocaine abuse   Chest pain   Type 2 diabetes mellitus with vascular disease (HCC)   History of seizure   Chest pain- patient'Schest pain increases on lying down initially concerning for possible pericarditis given that patient also recently had febrile illness. Appreciate cardiology consult and patient was started on colchicine and NSAIDs from admission which is stopped on 9/24 given echo pericardium unremarkable. 2-D echo lvef 55-60%, no wall motion abnormality, +grade II dysfucnition, pericardium unremarkable. cardiac markers negative. UDS + cocaine. Patient is on statins, metoprolol and aspirin and Plavix.  Febrile illness- patient reports has been experiencing fever and chills for last 1 month. blood cultures no growth  Lyme titer negative, RMSF IgG positive , titer less 1.64.   IVDoxycycline started on admission, no fever since being admitted,  change to oral doxycycline to finish total 14days treatment for presumed tick born disease. Patient does not have fever in the hospital  History of seizureson Lamictal and Keppra. Has not had any seizures recently.  History of polysubstance abuse- patient states he has not taken cocaine for many months. Though uds + cocaine,   noninsulin dependent Diabetes mellitus type 2 -a1c 5.3, home meds metformin held in the hospital, plan to restarted at a lower dose at discharge.  Anemia- follow CBC. Mild, hgb stable at baseline around 11.   Hypertension-  lopressor and lisinopril dose reduced, lasix changed to prn for edema, no edema at discharge.  Depression- patient hadendorsed suicidal ideas on arrival to the ER. psychiatry  consulted who recommended inpatient psych placement Psych to re-evaluate 9-28. Awaiting placement.   AKI; cr normalized 10.  CAD s/p CABG in 2002, continue follow up with cardiology  patient is medically cleared to be discharged to inpatient psych   DVT prophylaxis: SCDs. Code Status: Full code.  Family Communication: Discussed with patient.  Disposition Plan:  inpatient psych  Consults called: Cardiology. psych    Procedures:  none  Antibiotics:  doxycycline   Objective: BP 115/70 (BP Location: Left Arm)   Pulse (!) 55   Temp 97.6 F (36.4 C) (Oral)   Resp 18   Ht 5\' 10"  (1.778 m)   Wt 103.5 kg (228 lb 2.8 oz)   SpO2 98%   BMI 32.74 kg/m   Intake/Output Summary (Last 24 hours) at 11/19/15 1522 Last data filed at 11/19/15 1500  Gross per 24 hour  Intake              960 ml  Output                0 ml  Net              960 ml   Filed Weights   11/12/15 2305  Weight: 103.5 kg (228 lb 2.8 oz)    Exam:   General:  NAD, flat affect  Cardiovascular: RRR  Respiratory: CTABL  Abdomen: Soft/ND/NT, positive BS  Musculoskeletal: No Edema  Neuro: aaox3  Data Reviewed: Basic Metabolic Panel:  Recent Labs Lab 11/13/15 0425 11/14/15 0231 11/15/15 0311 11/16/15 0316 11/17/15 0241  NA 140 138 138 139 138  K 3.1* 4.0 3.5 4.1 3.4*  CL 107 107 107 104 104  CO2  27 25 26 28 29   GLUCOSE 93 98 99 86 121*  BUN 10 9 7 9 14   CREATININE 1.15 1.01 0.91 0.97 1.12  CALCIUM 8.7* 8.4* 8.8* 9.1 8.8*  MG  --  1.9  --  1.8  --    Liver Function Tests:  Recent Labs Lab 11/13/15 0425 11/14/15 0231  AST 26 27  ALT 13* 14*  ALKPHOS 41 55  BILITOT 1.3* 0.6  PROT 6.0* 5.7*  ALBUMIN 3.2* 3.2*   No results for input(s): LIPASE, AMYLASE in the last 168 hours. No results for input(s): AMMONIA in the last 168 hours. CBC:  Recent Labs Lab 11/12/15 1809 11/13/15 0425 11/14/15 0231 11/15/15 0311 11/16/15 0316 11/17/15 0241  WBC 10.8* 6.2 3.7* 4.0 3.3* 4.0    NEUTROABS 8.8* 3.4  --   --   --   --   HGB 12.4* 10.6* 11.0* 11.5* 11.9* 11.7*  HCT 37.2* 32.2* 33.8* 35.5* 35.9* 35.8*  MCV 87.1 87.0 88.0 88.3 86.3 87.5  PLT 197 145* 136* 151 181 182   Cardiac Enzymes:    Recent Labs Lab 11/12/15 2241 11/13/15 0425 11/13/15 0913  TROPONINI 0.04* 0.03* <0.03   BNP (last 3 results) No results for input(s): BNP in the last 8760 hours.  ProBNP (last 3 results) No results for input(s): PROBNP in the last 8760 hours.  CBG:  Recent Labs Lab 11/18/15 1107 11/18/15 1634 11/18/15 2118 11/19/15 0638 11/19/15 1132  GLUCAP 80 76 98 86 109*    Recent Results (from the past 240 hour(s))  Culture, blood (routine x 2)     Status: None   Collection Time: 11/12/15 10:15 PM  Result Value Ref Range Status   Specimen Description BLOOD RIGHT ARM  Final   Special Requests BOTTLES DRAWN AEROBIC AND ANAEROBIC 5CC  Final   Culture NO GROWTH 5 DAYS  Final   Report Status 11/17/2015 FINAL  Final  Culture, blood (routine x 2)     Status: None   Collection Time: 11/12/15 10:28 PM  Result Value Ref Range Status   Specimen Description BLOOD LEFT ARM  Final   Special Requests IN PEDIATRIC BOTTLE 4CC  Final   Culture NO GROWTH 5 DAYS  Final   Report Status 11/17/2015 FINAL  Final     Studies: No results found.  Scheduled Meds: . alprazolam  2 mg Oral BID  . aspirin  325 mg Oral Daily  . atorvastatin  10 mg Oral q morning - 10a  . budesonide  0.5 mg Inhalation BID  . clopidogrel  75 mg Oral QAC breakfast  . doxycycline  100 mg Oral Q12H  . hydrOXYzine  25 mg Oral Q6H  . insulin aspart  0-9 Units Subcutaneous TID WC  . lamoTRIgine  200 mg Oral Daily  . levETIRAcetam  500 mg Oral Q12H  . lisinopril  5 mg Oral Daily  . loratadine  10 mg Oral Daily  . metoprolol tartrate  12.5 mg Oral BID  . pantoprazole  40 mg Oral Daily  . sertraline  100 mg Oral Daily  . tamsulosin  0.4 mg Oral QPC breakfast    Continuous Infusions:     Time spent:  74mins  Elmarie Shiley MD [facility] Pager [phone]. If 7PM-7AM, please contact night-coverage at [email or web address], password [email or web address] 11/19/2015, 3:22 PM  LOS: 0 days

## 2015-11-19 NOTE — Progress Notes (Signed)
Patient States continues to have suicidal thoughts with a plan. States no one will care if he is gone. Patient would not disclose plan at this time, but assures he has one. SI Sitter at bedside. Will continue to monitor.  Domingo Dimes RN

## 2015-11-20 DIAGNOSIS — Z8371 Family history of colonic polyps: Secondary | ICD-10-CM

## 2015-11-20 DIAGNOSIS — Z833 Family history of diabetes mellitus: Secondary | ICD-10-CM

## 2015-11-20 DIAGNOSIS — Z8249 Family history of ischemic heart disease and other diseases of the circulatory system: Secondary | ICD-10-CM | POA: Diagnosis not present

## 2015-11-20 DIAGNOSIS — Z79899 Other long term (current) drug therapy: Secondary | ICD-10-CM

## 2015-11-20 DIAGNOSIS — F1494 Cocaine use, unspecified with cocaine-induced mood disorder: Secondary | ICD-10-CM

## 2015-11-20 DIAGNOSIS — R079 Chest pain, unspecified: Secondary | ICD-10-CM | POA: Diagnosis not present

## 2015-11-20 DIAGNOSIS — Z8379 Family history of other diseases of the digestive system: Secondary | ICD-10-CM

## 2015-11-20 DIAGNOSIS — Z791 Long term (current) use of non-steroidal anti-inflammatories (NSAID): Secondary | ICD-10-CM

## 2015-11-20 DIAGNOSIS — Z800 Family history of malignant neoplasm of digestive organs: Secondary | ICD-10-CM

## 2015-11-20 LAB — GLUCOSE, CAPILLARY
GLUCOSE-CAPILLARY: 92 mg/dL (ref 65–99)
GLUCOSE-CAPILLARY: 115 mg/dL — AB (ref 65–99)

## 2015-11-20 NOTE — Discharge Summary (Signed)
Physician Discharge Summary  Dalton [name] [id number] DOB: Oct 24, 1960 DOA: 11/12/2015  PCP: Philis Fendt, MD  Admit date: 11/12/2015 Discharge date: 11/20/2015  Admitted From: Home  Disposition: home  Recommendations for Outpatient Follow-up:  1. Follow up with PCP in 1-2 weeks 2. Please obtain BMP/CBC in one week 3. Please follow up on the following pending results:    Discharge Condition: Stable.  CODE STATUS: Full code.  Diet recommendation: Heart Healthy   Brief/Interim Summary: Chest pain- patient'Schest pain increases on lying down initially concerning for possible pericarditis given that patient also recently had febrile illness. Appreciate cardiology consult and patient was started on colchicine and NSAIDs from admission which is stopped on 9/24 given echo pericardium unremarkable. 2-D echo lvef 55-60%, no wall motion abnormality, +grade II dysfucnition, pericardium unremarkable. cardiac markers negative. UDS + cocaine. Patient is on statins, metoprolol and aspirin and Plavix.  Febrile illness- patient reports has been experiencing fever and chills for last 1 month. blood cultures no growth  Lyme titer negative, RMSF IgG positive , titer less 1.64.   IVDoxycycline started on admission, no fever since being admitted,  change to oral doxycycline to finish total 14days treatment for presumed tick born disease. Patient does not have fever in the hospital   History of seizureson Lamictal and Keppra. Has not had any seizures recently.  History of polysubstance abuse- patient states he has not taken cocaine for many months. Though uds + cocaine,   noninsulin dependent Diabetes mellitus type 2 -a1c 5.3, home meds metformin held in the hospital, plan to restarted at a lower dose at discharge.  Anemia- follow CBC. Mild, hgb stable at baseline around 11.   Hypertension- lopressor and lisinopril dose reduced, lasix changed to prn for edema, no edema at  discharge.  Depression- patient hadendorsed suicidal ideas on arrival to the ER. psychiatry consulted who recommended inpatient psych placement Psych to re-evaluate 9-28. Patient was clear by psychiatrist. Patient can be discharge home with outpatient follow up with Psych.    AKI; cr normalized 10.  CAD s/p CABG in 2002, continue follow up with cardiology   Principal Problem:   Cocaine-induced mood disorder ([place]) Active Problems:   Essential hypertension, benign   Cocaine abuse   Chest pain   Type 2 diabetes mellitus with vascular disease ([place])   History of seizure    Discharge Instructions  Discharge Instructions    Diet - low sodium heart healthy    Complete by:  As directed    Carb modified   Diet - low sodium heart healthy    Complete by:  As directed    Increase activity slowly    Complete by:  As directed    Increase activity slowly    Complete by:  As directed        Medication List    STOP taking these medications   aspirin 325 MG tablet Replaced by:  aspirin EC 81 MG tablet   hydrochlorothiazide 25 MG tablet Commonly known as:  HYDRODIURIL     TAKE these medications   alprazolam 2 MG tablet Commonly known as:  XANAX Take 2 mg by mouth 2 (two) times daily.   aspirin EC 81 MG tablet Take 1 tablet (81 mg total) by mouth daily. Replaces:  aspirin 325 MG tablet   atorvastatin 10 MG tablet Commonly known as:  LIPITOR Take 1 tablet (10 mg total) by mouth every morning.   beclomethasone 80 MCG/ACT inhaler Commonly known as:  QVAR Inhale 2  puffs into the lungs 2 (two) times daily. What changed:  when to take this  reasons to take this   cetirizine 10 MG tablet Commonly known as:  ZYRTEC Take 10 mg by mouth daily as needed for allergies or rhinitis. Reported on 04/16/2015   clopidogrel 75 MG tablet Commonly known as:  PLAVIX Take 75 mg by mouth daily before breakfast.   diclofenac sodium 1 % Gel Commonly known as:  VOLTAREN Place 1  application onto the skin 4 (four) times daily as needed.   doxycycline 100 MG tablet Commonly known as:  VIBRA-TABS Take 1 tablet (100 mg total) by mouth every 12 (twelve) hours.   esomeprazole 40 MG capsule Commonly known as:  NEXIUM Take 1 capsule (40 mg total) by mouth 2 (two) times daily.   fluticasone 50 MCG/ACT nasal spray Commonly known as:  FLONASE Place 2 sprays into both nostrils daily as needed for allergies.   furosemide 40 MG tablet Commonly known as:  LASIX Take 1 tablet (40 mg total) by mouth daily as needed for fluid or edema. What changed:  when to take this  reasons to take this   hydrOXYzine 25 MG tablet Commonly known as:  ATARAX/VISTARIL Take 1 tablet (25 mg total) by mouth every 6 (six) hours.   ipratropium 0.02 % nebulizer solution Commonly known as:  ATROVENT Take 0.5 mg by nebulization every 6 (six) hours as needed for wheezing or shortness of breath.   lamoTRIgine 200 MG tablet Commonly known as:  LAMICTAL Take 1 tablet (200 mg total) by mouth daily.   levETIRAcetam 500 MG tablet Commonly known as:  KEPPRA Take 1 tablet (500 mg total) by mouth every 12 (twelve) hours.   lisinopril 2.5 MG tablet Commonly known as:  ZESTRIL Take 1 tablet (2.5 mg total) by mouth daily. What changed:  medication strength  how much to take  additional instructions   LYRICA 100 MG capsule Generic drug:  pregabalin Take 100-200 mg by mouth 3 (three) times daily as needed (for shooting pains in feet).   metFORMIN 1000 MG tablet Commonly known as:  GLUCOPHAGE Take 0.5 tablets (500 mg total) by mouth daily with breakfast. What changed:  how much to take   metoprolol tartrate 25 MG tablet Commonly known as:  LOPRESSOR Take 0.5 tablets (12.5 mg total) by mouth 2 (two) times daily. What changed:  how much to take   oxyCODONE-acetaminophen 10-325 MG tablet Commonly known as:  PERCOCET Take 1 tablet by mouth every 6 (six) hours as needed for pain.    potassium chloride SA 20 MEQ tablet Commonly known as:  K-DUR,KLOR-CON Take 1 tablet (20 mEq total) by mouth daily. What changed:  when to take this   albuterol (2.5 MG/3ML) 0.083% nebulizer solution Commonly known as:  PROVENTIL Take 3 mLs (2.5 mg total) by nebulization 2 (two) times daily. What changed:  when to take this   PROAIR HFA 108 (90 Base) MCG/ACT inhaler Generic drug:  albuterol Inhale 2 puffs into the lungs every 6 (six) hours as needed for wheezing (for shortness of breath). What changed:  Another medication with the same name was changed. Make sure you understand how and when to take each.   sertraline 100 MG tablet Commonly known as:  ZOLOFT Take 1 tablet (100 mg total) by mouth daily.   tamsulosin 0.4 MG Caps capsule Commonly known as:  FLOMAX Take 1 capsule (0.4 mg total) by mouth daily after breakfast.      Follow-up Information  Philis Fendt, MD Follow up in 1 month(s).   Specialty:  Internal Medicine Contact information: [place] [address] Tyler III, MD Follow up in 1 month(s).   Specialty:  Cardiology Why:  h/o CABG Contact information: [id number]  [place] [address] [phone]          Allergies  Allergen Reactions  . Gabapentin Hives  . Ibuprofen Other (See Comments)    REACTION:Stomach  upset  . Zolpidem Tartrate Other (See Comments)    REACTION: Hallucinations   . Naproxen Other (See Comments)    REACTION: unknown    Consultations:  Psych    Procedures/Studies: Dg Chest 2 View  Result Date: 11/12/2015 CLINICAL DATA:  Chest pain and shortness of breath for several weeks. History of hypertension, CHF. EXAM: CHEST  2 VIEW COMPARISON:  Chest radiograph October 17, 2015 FINDINGS: Cardiomediastinal silhouette is normal. Status post median sternotomy for CABG. Fractured most caudal median sternotomy wire. LEFT subclavian stent. No pleural effusions or  focal consolidations. Trachea projects midline and there is no pneumothorax. Soft tissue planes and included osseous structures are non-suspicious. IMPRESSION: No acute cardiopulmonary process. Electronically Signed   By: Elon Alas M.D.   On: 11/12/2015 18:48       Subjective: No new complaints.   Discharge Exam: Vitals:   11/20/15 1327 11/20/15 1331  BP: 114/66   Pulse: 60 (!) 58  Resp: 18   Temp: 98.2 F (36.8 C)    Vitals:   11/19/15 2104 11/20/15 0451 11/20/15 1327 11/20/15 1331  BP: (!) 111/52 (!) 137/47 114/66   Pulse: 60 67 60 (!) 58  Resp: 18 18 18    Temp: 97.3 F (36.3 C) 98.2 F (36.8 C) 98.2 F (36.8 C)   TempSrc: Oral Oral Oral   SpO2: 98% 96% 100%   Weight:      Height:        General: Pt is alert, awake, not in acute distress Cardiovascular: RRR, S1/S2 +, no rubs, no gallops Respiratory: CTA bilaterally, no wheezing, no rhonchi Abdominal: Soft, NT, ND, bowel sounds + Extremities: no edema, no cyanosis    The results of significant diagnostics from this hospitalization (including imaging, microbiology, ancillary and laboratory) are listed below for reference.     Microbiology: Recent Results (from the past 240 hour(s))  Culture, blood (routine x 2)     Status: None   Collection Time: 11/12/15 10:15 PM  Result Value Ref Range Status   Specimen Description BLOOD RIGHT ARM  Final   Special Requests BOTTLES DRAWN AEROBIC AND ANAEROBIC 5CC  Final   Culture NO GROWTH 5 DAYS  Final   Report Status 11/17/2015 FINAL  Final  Culture, blood (routine x 2)     Status: None   Collection Time: 11/12/15 10:28 PM  Result Value Ref Range Status   Specimen Description BLOOD LEFT ARM  Final   Special Requests IN PEDIATRIC BOTTLE 4CC  Final   Culture NO GROWTH 5 DAYS  Final   Report Status 11/17/2015 FINAL  Final     Labs: BNP (last 3 results) No results for input(s): BNP in the last 8760 hours. Basic Metabolic Panel:  Recent Labs Lab 11/14/15 0231  11/15/15 0311 11/16/15 0316 11/17/15 0241  NA 138 138 139 138  K 4.0 3.5 4.1 3.4*  CL 107 107 104 104  CO2 25 26 28 29   GLUCOSE 98 99 86 121*  BUN 9 7 9 14   CREATININE 1.01 0.91 0.97 1.12  CALCIUM 8.4* 8.8* 9.1 8.8*  MG 1.9  --  1.8  --    Liver Function Tests:  Recent Labs Lab 11/14/15 0231  AST 27  ALT 14*  ALKPHOS 55  BILITOT 0.6  PROT 5.7*  ALBUMIN 3.2*   No results for input(s): LIPASE, AMYLASE in the last 168 hours. No results for input(s): AMMONIA in the last 168 hours. CBC:  Recent Labs Lab 11/14/15 0231 11/15/15 0311 11/16/15 0316 11/17/15 0241  WBC 3.7* 4.0 3.3* 4.0  HGB 11.0* 11.5* 11.9* 11.7*  HCT 33.8* 35.5* 35.9* 35.8*  MCV 88.0 88.3 86.3 87.5  PLT 136* 151 181 182   Cardiac Enzymes: No results for input(s): CKTOTAL, CKMB, CKMBINDEX, TROPONINI in the last 168 hours. BNP: Invalid input(s): POCBNP CBG:  Recent Labs Lab 11/19/15 1132 11/19/15 1605 11/19/15 2100 11/20/15 0626 11/20/15 1111  GLUCAP 109* 110* 126* 92 115*   D-Dimer No results for input(s): DDIMER in the last 72 hours. Hgb A1c No results for input(s): HGBA1C in the last 72 hours. Lipid Profile No results for input(s): CHOL, HDL, LDLCALC, TRIG, CHOLHDL, LDLDIRECT in the last 72 hours. Thyroid function studies No results for input(s): TSH, T4TOTAL, T3FREE, THYROIDAB in the last 72 hours.  Invalid input(s): FREET3 Anemia work up No results for input(s): VITAMINB12, FOLATE, FERRITIN, TIBC, IRON, RETICCTPCT in the last 72 hours. Urinalysis    Component Value Date/Time   COLORURINE YELLOW 10/10/2015 2140   APPEARANCEUR CLEAR 10/10/2015 2140   LABSPEC <1.005 (L) 10/10/2015 2140   PHURINE 6.0 10/10/2015 2140   GLUCOSEU NEGATIVE 10/10/2015 2140   HGBUR NEGATIVE 10/10/2015 2140   BILIRUBINUR NEGATIVE 10/10/2015 2140   KETONESUR NEGATIVE 10/10/2015 2140   PROTEINUR NEGATIVE 10/10/2015 2140   UROBILINOGEN 1.0 09/25/2011 1513   NITRITE NEGATIVE 10/10/2015 2140    LEUKOCYTESUR NEGATIVE 10/10/2015 2140   Sepsis Labs Invalid input(s): PROCALCITONIN,  WBC,  LACTICIDVEN Microbiology Recent Results (from the past 240 hour(s))  Culture, blood (routine x 2)     Status: None   Collection Time: 11/12/15 10:15 PM  Result Value Ref Range Status   Specimen Description BLOOD RIGHT ARM  Final   Special Requests BOTTLES DRAWN AEROBIC AND ANAEROBIC 5CC  Final   Culture NO GROWTH 5 DAYS  Final   Report Status 11/17/2015 FINAL  Final  Culture, blood (routine x 2)     Status: None   Collection Time: 11/12/15 10:28 PM  Result Value Ref Range Status   Specimen Description BLOOD LEFT ARM  Final   Special Requests IN PEDIATRIC BOTTLE 4CC  Final   Culture NO GROWTH 5 DAYS  Final   Report Status 11/17/2015 FINAL  Final     Time coordinating discharge: Over 30 minutes  SIGNED:   Elmarie Shiley, MD  [facility] 11/20/2015, 1:33 PM Pager [phone]  If 7PM-7AM, please contact night-coverage [email or web address] Password [email or web address]

## 2015-11-20 NOTE — Progress Notes (Signed)
Pt belongings held in safe at security returned, pt signed off on receiving.  Discharge instructions reviewed with pt.  He states understanding and states that he is able to obtain ordered medications.  Discharged off unit with three bus passes via w/c, transported by [employer] crew.

## 2015-11-20 NOTE — Progress Notes (Signed)
Per pt request, called his home care service, [facility], to notify his nurse, Theotis Barrio, that he has been inpatient with [place] since 9/20.  Donaciano Eva, RN took message and stated she will let Ms. Henderson know.  Provided my direct number in case they need to contact me for any reason.  [employer] [phone]

## 2015-11-20 NOTE — Progress Notes (Signed)
Patient cleared by psych to discharge to home.  Orders to discharge received.  Social work has provided pt with three bus passes, as he states he does not have any other form of transportation to home.  IV and tele removed, CCMD notified.  Pt follow up appointment with Dr. Reece Levy, MD (his regular out patient psychiatrist).  Pt getting dressed and ready for discharge.

## 2015-11-20 NOTE — Consult Note (Addendum)
Dalton [name] Psychiatry Consult   Reason for Consult:  Follow up  Referring Physician:   Dr. Tyrell Antonio Patient Identification: Dalton [name] MRN:  [id number] Principal Diagnosis: Cocaine-induced mood disorder [facility]) Diagnosis:   Patient Active Problem List   Diagnosis Date Noted  . Chest pain [R07.9] 11/12/2015  . Type 2 diabetes mellitus with vascular disease ([place]) [E11.59] 11/12/2015  . History of seizure [Z86.69] 11/12/2015  . Cocaine abuse [F14.10] 01/26/2015  . Cocaine-induced mood disorder ([place]) [F14.94] 01/26/2015  . Helicobacter pylori gastritis [B96.81] 05/30/2012  . Pure hypercholesterolemia [E78.00] 08/31/2011  . Essential hypertension, benign [I10] 08/31/2011  . 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 PAD (peripheral artery disease) ([place]) [I73.9] 08/31/2011  . GERD (gastroesophageal reflux disease) [K21.9] 12/07/2010  . Esophageal dysphagia [R13.14] 12/07/2010  . Constipation [K59.00] 12/07/2010  . Laryngopharyngeal reflux (LPR) [J38.7] 12/07/2010  . Lumbar pain with radiation down left leg [M54.5] 11/17/2010  . CARPAL TUNNEL SYNDROME [G56.00] 07/14/2009  . SHOULDER IMPINGEMENT SYNDROME, LEFT [M75.80] 05/05/2009    Total Time spent with patient: 30 minutes   Subjective:   Dalton [name] is a 55 y.o. male patient admitted with chest pain .  HPI: Please refer to initial psychiatric consultation by Dr. Louretta Shorten, dated 9/21. Patient is a 55 year old male, who has a history of  CAD, MIs in the past. Presented with chest pain - pericarditis is being considered, based on presentation and recent febrile illness . Patient has a history of depression and a history of substance abuse- cocaine is identified as substance of choice . When seen by Dr. Louretta Shorten on 9/21 patient endorsed depression and suicidal ideations .  At this time patient reports some ongoing depression but states he is feeling better, with improving mood and improving  energy level. He states he has had no further suicidal ideations . He admits he had been considering suicide prior to admission, but that he " thought about it" and has decided " I want to live", and identifies family, particularly grandchildren as protective factor against suicide. He states he has no intention of further illicit drug use, and expresses awareness of the negative impact cocaine could have on his health. He states he has not used cocaine in a period of several weeks to months , but admission  UDS was positive for cocaine and for opiates ( and negative for BZD , which he was being prescribed )  He does continue to present with a constricted affect, and endorses some ongoing depression, but states he does feel Zoloft is helping . At this time he is future oriented, and states he plans to follow up with Dr. Reece Levy , his outpatient psychiatrist, for ongoing treatment, and that he needs a referral to cardiologist, because he no longer wants to follow up with the specialist he had been seeing prior to admission . 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    Past Psychiatric History: history of depression, no history of mania or hypomania endorsed, no history of psychosis, history of suicide attempt about 10 years ago by cutting forearms. (+) history of substance abuse ( cocaine primarily )   Risk to Self: Is patient at risk for suicide?: No Risk to Others:   Prior Inpatient Therapy:   Prior Outpatient Therapy:    Past Medical History:  Past Medical History:  Diagnosis Date  .  Anemia   . Arthritis   . Bronchitis, chronic ([place])   . CHF (congestive heart failure) ([place])   . Chronic back pain    Pain Clinic in [place]  . Chronic bronchitis   . Congestive heart failure (CHF) ([place])   . Coronary artery disease   . Depression   . Diabetes mellitus without complication ([place])     TYPE 2  . Frequency of urination   . GERD (gastroesophageal reflux disease)   . Headache(784.0)   . High cholesterol   . Hypertension   . Laceration of right hand 11/27/10  . Laceration of wrist 2007 BIL FOREARMS  . MI (myocardial infarction) ([place])    7, last one was in 2011  . PUD (peptic ulcer disease)    in 1990s, secondary to medication  . Seizures ([place])    entire life, last seizure in 2011;unknown etiology-pt sts heriditary  . Shortness of breath    with exertion  . Tonsillitis, chronic    Dr. Vicki Mallet in [place]    Past Surgical History:  Procedure Laterality Date  . BACK SURGERY     3-  . BIOPSY N/A 05/30/2012   Procedure: BIOPSY;  Surgeon: Danie Binder, MD;  Location: [facility];  Service: Endoscopy;  Laterality: N/A;  . CARPAL TUNNEL RELEASE     bilateral  . COLONOSCOPY  12/28/10   SLF: (MAC)Internal hemorrhoids/four small colon polyps  . CORONARY ARTERY BYPASS GRAFT  2002   3 vessels  . ESOPHAGOGASTRODUODENOSCOPY N/A 05/30/2012   SLF: UNCONTROLLED GERD DUE TO LIFESTYLE CHOICE/WEIGHT GAIN/MILD Non-erosive gastritis  . KNEE SURGERY     left-plate to left knee cap from accident  . LEFT HEART CATHETERIZATION WITH CORONARY ANGIOGRAM N/A 08/31/2011   Procedure: LEFT HEART CATHETERIZATION WITH CORONARY ANGIOGRAM;  Surgeon: Laverda Page, MD;  Location: [facility] CATH LAB;  Service: Cardiovascular;  Laterality: N/A;  . SAVORY DILATION  12/28/2010   SLF:(MAC)J-shaped stomach/nodular mocosa in the distal esophagus/empiric dilation 2mm   Family History:  Family History  Problem Relation Age of Onset  . Diabetes Mother   . Hypertension Mother   . Heart attack Mother   . Hypertension Father   . Diabetes Father   . Heart attack Father   . Heart attack      mother, father, brother, sister all deceased due to MI  . Heart attack Sister   . Heart attack Brother   . Seizures Brother   . Heart failure Other   . Colon cancer Neg Hx   . Liver disease Neg Hx   . Anesthesia  problems Neg Hx   . Hypotension Neg Hx   . Malignant hyperthermia Neg Hx   . Pseudochol deficiency Neg Hx   . Colon polyps Neg Hx    Family Psychiatric  History: non contributory  Social History:  History  Alcohol Use  . 0.0 oz/week    Comment: Occasions.     History  Drug Use  . Types: "Crack" cocaine    Comment: history of cocaine, etoh, marijuana but denies any the last several years-12 yrs ago    Social History   Social History  . Marital status: Single    Spouse name: N/A  . Number of children: 2  . Years of education: N/A   Occupational History  . disabled Not Employed   Social History Main Topics  . Smoking status: Never Smoker  . Smokeless tobacco: Never Used     Comment: Never Smoked  . Alcohol  use 0.0 oz/week     Comment: Occasions.  . Drug use:     Types: "Crack" cocaine     Comment: history of cocaine, etoh, marijuana but denies any the last several years-12 yrs ago  . Sexual activity: Not Asked   Other Topics Concern  . None   Social History Narrative   Lives w/ son-23/22   Additional Social History:    Allergies:   Allergies  Allergen Reactions  . Gabapentin Hives  . Ibuprofen Other (See Comments)    REACTION:Stomach  upset  . Zolpidem Tartrate Other (See Comments)    REACTION: Hallucinations   . Naproxen Other (See Comments)    REACTION: unknown    Labs:  Results for orders placed or performed during the hospital encounter of 11/12/15 (from the past 48 hour(s))  Glucose, capillary     Status: None   Collection Time: 11/18/15  4:34 PM  Result Value Ref Range   Glucose-Capillary 76 65 - 99 mg/dL   Comment 1 Notify RN    Comment 2 Document in Chart   Glucose, capillary     Status: None   Collection Time: 11/18/15  9:18 PM  Result Value Ref Range   Glucose-Capillary 98 65 - 99 mg/dL  Glucose, capillary     Status: None   Collection Time: 11/19/15  6:38 AM  Result Value Ref Range   Glucose-Capillary 86 65 - 99 mg/dL  Glucose,  capillary     Status: Abnormal   Collection Time: 11/19/15 11:32 AM  Result Value Ref Range   Glucose-Capillary 109 (H) 65 - 99 mg/dL  Glucose, capillary     Status: Abnormal   Collection Time: 11/19/15  4:05 PM  Result Value Ref Range   Glucose-Capillary 110 (H) 65 - 99 mg/dL   Comment 1 Notify RN    Comment 2 Document in Chart   Glucose, capillary     Status: Abnormal   Collection Time: 11/19/15  9:00 PM  Result Value Ref Range   Glucose-Capillary 126 (H) 65 - 99 mg/dL  Glucose, capillary     Status: None   Collection Time: 11/20/15  6:26 AM  Result Value Ref Range   Glucose-Capillary 92 65 - 99 mg/dL  Glucose, capillary     Status: Abnormal   Collection Time: 11/20/15 11:11 AM  Result Value Ref Range   Glucose-Capillary 115 (H) 65 - 99 mg/dL    Current Facility-Administered Medications  Medication Dose Route Frequency Provider Last Rate Last Dose  . acetaminophen (TYLENOL) tablet 650 mg  650 mg Oral Q6H PRN Rise Patience, MD       Or  . acetaminophen (TYLENOL) suppository 650 mg  650 mg Rectal Q6H PRN Rise Patience, MD      . albuterol (PROVENTIL) (2.5 MG/3ML) 0.083% nebulizer solution 2.5 mg  2.5 mg Inhalation Q6H PRN Rise Patience, MD      . ALPRAZolam Duanne Moron) tablet 2 mg  2 mg Oral BID Rise Patience, MD   2 mg at 11/20/15 1051  . aspirin tablet 325 mg  325 mg Oral Daily Rise Patience, MD   325 mg at 11/20/15 1050  . atorvastatin (LIPITOR) tablet 10 mg  10 mg Oral q morning - 10a Rise Patience, MD   10 mg at 11/20/15 1051  . budesonide (PULMICORT) nebulizer solution 0.5 mg  0.5 mg Inhalation BID Rise Patience, MD   0.5 mg at 11/20/15 0925  . clopidogrel (PLAVIX) tablet 75  mg  75 mg Oral QAC breakfast Rise Patience, MD   75 mg at 11/20/15 1050  . doxycycline (VIBRA-TABS) tablet 100 mg  100 mg Oral Q12H Florencia Reasons, MD   100 mg at 11/20/15 1051  . feeding supplement (ENSURE ENLIVE) (ENSURE ENLIVE) liquid 237 mL  237 mL Oral BID BM  Belkys A Regalado, MD   237 mL at 11/20/15 1050  . fluticasone (FLONASE) 50 MCG/ACT nasal spray 2 spray  2 spray Each Nare Daily PRN Rise Patience, MD      . hydrOXYzine (ATARAX/VISTARIL) tablet 25 mg  25 mg Oral Q6H Rise Patience, MD   25 mg at 11/20/15 1151  . insulin aspart (novoLOG) injection 0-9 Units  0-9 Units Subcutaneous TID WC Rise Patience, MD   1 Units at 11/16/15 1218  . ipratropium (ATROVENT) nebulizer solution 0.5 mg  0.5 mg Nebulization Q6H PRN Rise Patience, MD      . lamoTRIgine (LAMICTAL) tablet 200 mg  200 mg Oral Daily Rise Patience, MD   200 mg at 11/20/15 1050  . levETIRAcetam (KEPPRA) tablet 500 mg  500 mg Oral Q12H Rise Patience, MD   500 mg at 11/20/15 1050  . lisinopril (PRINIVIL,ZESTRIL) tablet 5 mg  5 mg Oral Daily Rise Patience, MD   5 mg at 11/20/15 1051  . loratadine (CLARITIN) tablet 10 mg  10 mg Oral Daily Rise Patience, MD   10 mg at 11/20/15 1050  . metoprolol tartrate (LOPRESSOR) tablet 12.5 mg  12.5 mg Oral BID Florencia Reasons, MD   12.5 mg at 11/20/15 1050  . ondansetron (ZOFRAN) tablet 4 mg  4 mg Oral Q6H PRN Rise Patience, MD       Or  . ondansetron [facility]) injection 4 mg  4 mg Intravenous Q6H PRN Rise Patience, MD      . oxyCODONE-acetaminophen (PERCOCET/ROXICET) 5-325 MG per tablet 1 tablet  1 tablet Oral Q6H PRN Rise Patience, MD   1 tablet at 11/20/15 0357   And  . oxyCODONE (Oxy IR/ROXICODONE) immediate release tablet 5 mg  5 mg Oral Q6H PRN Rise Patience, MD   5 mg at 11/17/15 1004  . pantoprazole (PROTONIX) EC tablet 40 mg  40 mg Oral Daily Rise Patience, MD   40 mg at 11/20/15 1051  . pregabalin (LYRICA) capsule 100-200 mg  100-200 mg Oral TID PRN Rise Patience, MD      . sertraline (ZOLOFT) tablet 100 mg  100 mg Oral Daily Rise Patience, MD   100 mg at 11/20/15 1051  . tamsulosin (FLOMAX) capsule 0.4 mg  0.4 mg Oral QPC breakfast Rise Patience, MD   0.4 mg at  11/20/15 1051    Musculoskeletal: Strength & Muscle Tone: within normal limits Gait & Station: normal Patient leans: N/A  Psychiatric Specialty Exam: Physical Exam  ROS at this time denies chest pain, denies shortness of breath, no vomiting  Blood pressure (!) 137/47, pulse 67, temperature 98.2 F (36.8 C), temperature source Oral, resp. rate 18, height 5\' 10"  (1.778 m), weight 228 lb 2.8 oz (103.5 kg), SpO2 96 %.Body mass index is 32.74 kg/m.  General Appearance: Fairly Groomed  Eye Contact:  Good  Speech:  Normal Rate  Volume:  Decreased  Mood:  Depressed, but states he is feeling better than on admission  Affect:  blunted, restricted   Thought Process:  Linear  Orientation:  Full (Time, Place, and  Person)  Thought Content:  denies hallucinations, no delusions, not internally preoccupied   Suicidal Thoughts:  No- at this time denies current self injurious or  suicidal ideations, denies any homicidal or violent ideations   Homicidal Thoughts:  No  Memory:  recent and remote grossly intact   Judgement:  Fair  Insight:  improving   Psychomotor Activity:  Decreased  Concentration:  Concentration: Good and Attention Span: Good  Recall:  Good  Fund of Knowledge:  Good  Language:  Good  Akathisia:  Negative  Handed:  Right  AIMS (if indicated):     Assets:  Desire for Improvement Resilience  ADL's:  Improving   Cognition:  WNL  Sleep:       Assessment -  patient remains depressed, constricted in affect,  but describes some gradual improvement of mood, feels better than on admission, and denies any ongoing suicidal plans or intention. States he did have some SI on admission but that at this time  has decided against suicide and has decided that  he " wants to live ". Presents future oriented, focusing on outpatient psychiatric and cardiology follow ups , and identifies family, grandchildren as a protective factor against suicide.  He does feel Zoloft is helping . Denies side  effects.  He expresses awareness of negative impact of substance abuse on his health and a desire to maintain sobriety on discharge.    Treatment Plan Summary: as below   Disposition: No evidence of imminent risk to self or others at present.   Based on presentation and assessment as above ,there are no current grounds for involuntary commitment or current  involuntary admission to a psychiatric unit .  I have encouraged him to consider a voluntary inpatient psychiatric admission to optimize antidepressant medication further, but he declines, states he would rather continue outpatient treatment and plans to  follow up with Dr. Reece Levy .  Have reviewed importance of sobriety, abstinence as part of treatment plan, patient acknowledges - 12 step meeting attendance and  participation encouraged.   Would continue Zoloft at  current dose , consider recommending gradual BZD taper, based on history of substance abuse .  Please include referral for outpatient psychiatric and psychotherapy services in discharge instructions- patient follows up with Dr. Reece Levy .     Neita Garnet, MD 11/20/2015 1:25 PM

## 2015-11-20 NOTE — Clinical Social Work Note (Signed)
Patient cleared by psychiatrist. Patient will discharge home. CSW signing off.  [employer], LCSW [phone]

## 2015-11-21 ENCOUNTER — Encounter (HOSPITAL_COMMUNITY): Payer: Self-pay | Admitting: Nurse Practitioner

## 2015-11-21 ENCOUNTER — Emergency Department (HOSPITAL_COMMUNITY)
Admission: EM | Admit: 2015-11-21 | Discharge: 2015-11-22 | Disposition: A | Discharge status: Other Acute Inpatient Hospital | Payer: Medicare Other | Attending: Emergency Medicine | Admitting: Emergency Medicine

## 2015-11-21 DIAGNOSIS — Z7982 Long term (current) use of aspirin: Secondary | ICD-10-CM | POA: Insufficient documentation

## 2015-11-21 DIAGNOSIS — I2510 Atherosclerotic heart disease of native coronary artery without angina pectoris: Secondary | ICD-10-CM | POA: Diagnosis not present

## 2015-11-21 DIAGNOSIS — I110 Hypertensive heart disease with heart failure: Secondary | ICD-10-CM | POA: Insufficient documentation

## 2015-11-21 DIAGNOSIS — I252 Old myocardial infarction: Secondary | ICD-10-CM | POA: Insufficient documentation

## 2015-11-21 DIAGNOSIS — I509 Heart failure, unspecified: Secondary | ICD-10-CM | POA: Insufficient documentation

## 2015-11-21 DIAGNOSIS — R45851 Suicidal ideations: Secondary | ICD-10-CM | POA: Insufficient documentation

## 2015-11-21 DIAGNOSIS — Z955 Presence of coronary angioplasty implant and graft: Secondary | ICD-10-CM | POA: Diagnosis not present

## 2015-11-21 DIAGNOSIS — E119 Type 2 diabetes mellitus without complications: Secondary | ICD-10-CM | POA: Diagnosis not present

## 2015-11-21 DIAGNOSIS — Z951 Presence of aortocoronary bypass graft: Secondary | ICD-10-CM | POA: Diagnosis not present

## 2015-11-21 DIAGNOSIS — Z7984 Long term (current) use of oral hypoglycemic drugs: Secondary | ICD-10-CM | POA: Diagnosis not present

## 2015-11-21 DIAGNOSIS — Z79899 Other long term (current) drug therapy: Secondary | ICD-10-CM | POA: Insufficient documentation

## 2015-11-21 DIAGNOSIS — F329 Major depressive disorder, single episode, unspecified: Secondary | ICD-10-CM | POA: Insufficient documentation

## 2015-11-21 LAB — URINALYSIS, ROUTINE W REFLEX MICROSCOPIC
Bilirubin Urine: NEGATIVE
GLUCOSE, UA: NEGATIVE mg/dL
HGB URINE DIPSTICK: NEGATIVE
Ketones, ur: NEGATIVE mg/dL
Leukocytes, UA: NEGATIVE
NITRITE: NEGATIVE
Protein, ur: NEGATIVE mg/dL
Specific Gravity, Urine: 1.019 (ref 1.005–1.030)
pH: 7 (ref 5.0–8.0)

## 2015-11-21 LAB — RAPID URINE DRUG SCREEN, HOSP PERFORMED
Amphetamines: NOT DETECTED
Barbiturates: NOT DETECTED
Benzodiazepines: POSITIVE — AB
Cocaine: POSITIVE — AB
Opiates: NOT DETECTED
Tetrahydrocannabinol: NOT DETECTED

## 2015-11-21 LAB — CBC WITH DIFFERENTIAL/PLATELET
BASOS PCT: 0 %
Basophils Absolute: 0 10*3/uL (ref 0.0–0.1)
EOS ABS: 0.2 10*3/uL (ref 0.0–0.7)
Eosinophils Relative: 4 %
HEMATOCRIT: 38.6 % — AB (ref 39.0–52.0)
HEMOGLOBIN: 13.1 g/dL (ref 13.0–17.0)
Lymphocytes Relative: 38 %
Lymphs Abs: 2.1 10*3/uL (ref 0.7–4.0)
MCH: 29.3 pg (ref 26.0–34.0)
MCHC: 33.9 g/dL (ref 30.0–36.0)
MCV: 86.4 fL (ref 78.0–100.0)
Monocytes Absolute: 0.5 10*3/uL (ref 0.1–1.0)
Monocytes Relative: 9 %
NEUTROS ABS: 2.8 10*3/uL (ref 1.7–7.7)
NEUTROS PCT: 49 %
Platelets: 217 10*3/uL (ref 150–400)
RBC: 4.47 MIL/uL (ref 4.22–5.81)
RDW: 14.3 % (ref 11.5–15.5)
WBC: 5.7 10*3/uL (ref 4.0–10.5)

## 2015-11-21 LAB — ETHANOL

## 2015-11-21 LAB — SALICYLATE LEVEL: Salicylate Lvl: <4 mg/dL (ref 2.8–30.0)

## 2015-11-21 LAB — BASIC METABOLIC PANEL
Anion gap: 7 (ref 5–15)
BUN: 8 mg/dL (ref 6–20)
CHLORIDE: 105 mmol/L (ref 101–111)
CO2: 26 mmol/L (ref 22–32)
CREATININE: 1.04 mg/dL (ref 0.61–1.24)
Calcium: 9.3 mg/dL (ref 8.9–10.3)
GFR calc non Af Amer: >60 mL/min (ref >60)
Glucose, Bld: 86 mg/dL (ref 65–99)
Potassium: 3.8 mmol/L (ref 3.5–5.1)
SODIUM: 138 mmol/L (ref 135–145)

## 2015-11-21 LAB — ACETAMINOPHEN LEVEL

## 2015-11-21 MED ORDER — BUDESONIDE 0.5 MG/2ML IN SUSP
0.2500 mg | Freq: Two times a day (BID) | RESPIRATORY_TRACT | Status: DC
  Administered 2015-11-21: 0.25 mg via RESPIRATORY_TRACT
  Filled 2015-11-21: qty 2

## 2015-11-21 MED ORDER — DOXYCYCLINE HYCLATE 100 MG PO TABS
100.0000 mg | ORAL_TABLET | Freq: Two times a day (BID) | ORAL | Status: DC
  Administered 2015-11-21: 100 mg via ORAL
  Filled 2015-11-21: qty 1

## 2015-11-21 MED ORDER — ALPRAZOLAM 0.5 MG PO TABS
2.0000 mg | ORAL_TABLET | Freq: Two times a day (BID) | ORAL | Status: DC
  Administered 2015-11-21: 2 mg via ORAL
  Filled 2015-11-21: qty 4

## 2015-11-21 MED ORDER — LISINOPRIL 2.5 MG PO TABS
2.5000 mg | ORAL_TABLET | Freq: Every day | ORAL | Status: DC
  Filled 2015-11-21: qty 1

## 2015-11-21 MED ORDER — ACETAMINOPHEN 500 MG PO TABS
500.0000 mg | ORAL_TABLET | Freq: Once | ORAL | Status: AC
  Administered 2015-11-21: 500 mg via ORAL
  Filled 2015-11-21: qty 1

## 2015-11-21 MED ORDER — LAMOTRIGINE 100 MG PO TABS
200.0000 mg | ORAL_TABLET | Freq: Every day | ORAL | Status: DC
  Administered 2015-11-21: 200 mg via ORAL
  Filled 2015-11-21: qty 2

## 2015-11-21 MED ORDER — DOXYCYCLINE HYCLATE 100 MG PO TABS: 100.0000 mg | ORAL_TABLET | Freq: Two times a day (BID) | ORAL | Status: DC

## 2015-11-21 MED ORDER — LEVETIRACETAM 500 MG PO TABS
500.0000 mg | ORAL_TABLET | Freq: Two times a day (BID) | ORAL | Status: DC
  Administered 2015-11-21: 500 mg via ORAL
  Filled 2015-11-21: qty 1

## 2015-11-21 MED ORDER — METOPROLOL TARTRATE 25 MG PO TABS
12.5000 mg | ORAL_TABLET | Freq: Two times a day (BID) | ORAL | Status: DC
  Administered 2015-11-21: 12.5 mg via ORAL
  Filled 2015-11-21: qty 1

## 2015-11-21 MED ORDER — BECLOMETHASONE DIPROPIONATE 80 MCG/ACT IN AERS
2.0000 | INHALATION_SPRAY | Freq: Two times a day (BID) | RESPIRATORY_TRACT | Status: DC
  Filled 2015-11-21: qty 8.7

## 2015-11-21 MED ORDER — CLOPIDOGREL BISULFATE 75 MG PO TABS: 75.0000 mg | ORAL_TABLET | Freq: Every day | ORAL | Status: DC

## 2015-11-21 MED ORDER — SERTRALINE HCL 50 MG PO TABS
100.0000 mg | ORAL_TABLET | Freq: Every day | ORAL | Status: DC
  Administered 2015-11-21: 100 mg via ORAL
  Filled 2015-11-21: qty 2

## 2015-11-21 NOTE — ED Notes (Signed)
Security has been contacted and informed that pt needs to be wanded.

## 2015-11-21 NOTE — ED Notes (Signed)
Pt has been wanded by security, sitter at bedside.

## 2015-11-21 NOTE — ED Provider Notes (Signed)
[facility] DEPT Provider Note   CSN: [id number] Arrival date & time: 11/21/15  1158     History   Chief Complaint Chief Complaint  Patient presents with  . Depression    HPI Dalton [name] is a 55 y.o. male.  The history is provided by the patient.  Mental Health Problem  Presenting symptoms: depression and suicidal thoughts   Degree of incapacity (severity):  Moderate Onset quality:  Gradual Duration:  1 week Timing:  Constant Progression:  Worsening Chronicity:  Recurrent Context: drug abuse and stressful life event (admission to hospital, family trouble)   Treatment compliance:  All of the time Relieved by:  Nothing Worsened by:  Nothing Ineffective treatments:  None tried Risk factors: hx of mental illness     Past Medical History:  Diagnosis Date  . Anemia   . Arthritis   . Bronchitis, chronic ([place])   . CHF (congestive heart failure) ([place])   . Chronic back pain    Pain Clinic in [place]  . Chronic bronchitis   . Congestive heart failure (CHF) ([place])   . Coronary artery disease   . Depression   . Diabetes mellitus without complication ([place])    TYPE 2  . Frequency of urination   . GERD (gastroesophageal reflux disease)   . Headache(784.0)   . High cholesterol   . Hypertension   . Laceration of right hand 11/27/10  . Laceration of wrist 2007 BIL FOREARMS  . MI (myocardial infarction) ([place])    7, last one was in 2011  . PUD (peptic ulcer disease)    in 1990s, secondary to medication  . Seizures ([place])    entire life, last seizure in 2011;unknown etiology-pt sts heriditary  . Shortness of breath    with exertion  . Tonsillitis, chronic    Dr. Vicki Mallet in [place]    Patient Active Problem List   Diagnosis Date Noted  . Chest pain 11/12/2015  . Type 2 diabetes mellitus with vascular disease ([place]) 11/12/2015  . History of seizure 11/12/2015  . Cocaine abuse 01/26/2015  . Cocaine-induced mood disorder ([place]) 01/26/2015  . Helicobacter  pylori gastritis 05/30/2012  . Pure hypercholesterolemia 08/31/2011  . Essential hypertension, benign 08/31/2011  . Postsurgical aortocoronary bypass status 08/31/2011  . PAD (peripheral artery disease) ([place]) 08/31/2011  . GERD (gastroesophageal reflux disease) 12/07/2010  . Esophageal dysphagia 12/07/2010  . Constipation 12/07/2010  . Laryngopharyngeal reflux (LPR) 12/07/2010  . Lumbar pain with radiation down left leg 11/17/2010  . CARPAL TUNNEL SYNDROME 07/14/2009  . SHOULDER IMPINGEMENT SYNDROME, LEFT 05/05/2009    Past Surgical History:  Procedure Laterality Date  . BACK SURGERY     3-  . BIOPSY N/A 05/30/2012   Procedure: BIOPSY;  Surgeon: Danie Binder, MD;  Location: [facility];  Service: Endoscopy;  Laterality: N/A;  . CARPAL TUNNEL RELEASE     bilateral  . COLONOSCOPY  12/28/10   SLF: (MAC)Internal hemorrhoids/four small colon polyps  . CORONARY ARTERY BYPASS GRAFT  2002   3 vessels  . ESOPHAGOGASTRODUODENOSCOPY N/A 05/30/2012   SLF: UNCONTROLLED GERD DUE TO LIFESTYLE CHOICE/WEIGHT GAIN/MILD Non-erosive gastritis  . KNEE SURGERY     left-plate to left knee cap from accident  . LEFT HEART CATHETERIZATION WITH CORONARY ANGIOGRAM N/A 08/31/2011   Procedure: LEFT HEART CATHETERIZATION WITH CORONARY ANGIOGRAM;  Surgeon: Laverda Page, MD;  Location: [facility] CATH LAB;  Service: Cardiovascular;  Laterality: N/A;  . SAVORY DILATION  12/28/2010   SLF:(MAC)J-shaped stomach/nodular mocosa in the  distal esophagus/empiric dilation 13mm       Home Medications    Prior to Admission medications   Medication Sig Start Date End Date Taking? Authorizing Provider  albuterol (PROAIR HFA) 108 (90 BASE) MCG/ACT inhaler Inhale 2 puffs into the lungs every 6 (six) hours as needed for wheezing (for shortness of breath).    Historical Provider, MD  albuterol (PROVENTIL) (2.5 MG/3ML) 0.083% nebulizer solution Take 3 mLs (2.5 mg total) by nebulization 2 (two) times daily. Patient taking differently:  Take 2.5 mg by nebulization 3 (three) times daily.  01/27/15   Benjamine Mola, FNP  alprazolam Duanne Moron) 2 MG tablet Take 2 mg by mouth 2 (two) times daily.    Historical Provider, MD  aspirin EC 81 MG tablet Take 1 tablet (81 mg total) by mouth daily. 11/17/15   Florencia Reasons, MD  atorvastatin (LIPITOR) 10 MG tablet Take 1 tablet (10 mg total) by mouth every morning. 01/27/15   Benjamine Mola, FNP  beclomethasone (QVAR) 80 MCG/ACT inhaler Inhale 2 puffs into the lungs 2 (two) times daily. Patient taking differently: Inhale 2 puffs into the lungs 2 (two) times daily as needed.  01/27/15   Benjamine Mola, FNP  cetirizine (ZYRTEC) 10 MG tablet Take 10 mg by mouth daily as needed for allergies or rhinitis. Reported on 04/16/2015    Historical Provider, MD  clopidogrel (PLAVIX) 75 MG tablet Take 75 mg by mouth daily before breakfast. 04/07/15   Historical Provider, MD  diclofenac sodium (VOLTAREN) 1 % GEL Place 1 application onto the skin 4 (four) times daily as needed. 10/08/14   Historical Provider, MD  doxycycline (VIBRA-TABS) 100 MG tablet Take 1 tablet (100 mg total) by mouth every 12 (twelve) hours. 11/17/15   Florencia Reasons, MD  esomeprazole (NEXIUM) 40 MG capsule Take 1 capsule (40 mg total) by mouth 2 (two) times daily. 01/27/15   Benjamine Mola, FNP  fluticasone (FLONASE) 50 MCG/ACT nasal spray Place 2 sprays into both nostrils daily as needed for allergies.  03/17/15   Historical Provider, MD  furosemide (LASIX) 40 MG tablet Take 1 tablet (40 mg total) by mouth daily as needed for fluid or edema. 11/17/15   Florencia Reasons, MD  hydrOXYzine (ATARAX/VISTARIL) 25 MG tablet Take 1 tablet (25 mg total) by mouth every 6 (six) hours. 04/16/15   Fransico Meadow, PA-C  ipratropium (ATROVENT) 0.02 % nebulizer solution Take 0.5 mg by nebulization every 6 (six) hours as needed for wheezing or shortness of breath.    Historical Provider, MD  lamoTRIgine (LAMICTAL) 200 MG tablet Take 1 tablet (200 mg total) by mouth daily. 01/27/15   Benjamine Mola, FNP  levETIRAcetam (KEPPRA) 500 MG tablet Take 1 tablet (500 mg total) by mouth every 12 (twelve) hours. 01/27/15   Benjamine Mola, FNP  lisinopril (ZESTRIL) 2.5 MG tablet Take 1 tablet (2.5 mg total) by mouth daily. 11/17/15   Florencia Reasons, MD  metFORMIN (GLUCOPHAGE) 1000 MG tablet Take 0.5 tablets (500 mg total) by mouth daily with breakfast. 11/17/15   Florencia Reasons, MD  metoprolol tartrate (LOPRESSOR) 25 MG tablet Take 0.5 tablets (12.5 mg total) by mouth 2 (two) times daily. 11/17/15   Florencia Reasons, MD  oxyCODONE-acetaminophen (PERCOCET) 10-325 MG tablet Take 1 tablet by mouth every 6 (six) hours as needed for pain.    Historical Provider, MD  potassium chloride SA (K-DUR,KLOR-CON) 20 MEQ tablet Take 1 tablet (20 mEq total) by mouth daily. 11/17/15   Florencia Reasons,  MD  pregabalin (LYRICA) 100 MG capsule Take 100-200 mg by mouth 3 (three) times daily as needed (for shooting pains in feet).  12/20/14   Historical Provider, MD  sertraline (ZOLOFT) 100 MG tablet Take 1 tablet (100 mg total) by mouth daily. 01/27/15   Benjamine Mola, FNP  tamsulosin (FLOMAX) 0.4 MG CAPS capsule Take 1 capsule (0.4 mg total) by mouth daily after breakfast. 01/27/15   Benjamine Mola, FNP    Family History Family History  Problem Relation Age of Onset  . Diabetes Mother   . Hypertension Mother   . Heart attack Mother   . Hypertension Father   . Diabetes Father   . Heart attack Father   . Heart attack      mother, father, brother, sister all deceased due to MI  . Heart attack Sister   . Heart attack Brother   . Seizures Brother   . Heart failure Other   . Colon cancer Neg Hx   . Liver disease Neg Hx   . Anesthesia problems Neg Hx   . Hypotension Neg Hx   . Malignant hyperthermia Neg Hx   . Pseudochol deficiency Neg Hx   . Colon polyps Neg Hx     Social History Social History  Substance Use Topics  . Smoking status: Never Smoker  . Smokeless tobacco: Never Used     Comment: Never Smoked  . Alcohol use No      Comment: Occasions.     Allergies   Gabapentin; Ibuprofen; Zolpidem tartrate; and Naproxen   Review of Systems Review of Systems  Psychiatric/Behavioral: Positive for suicidal ideas.  All other systems reviewed and are negative.    Physical Exam Updated Vital Signs BP 132/92 (BP Location: Right Arm)   Pulse 82   Temp 98 F (36.7 C) (Oral)   Resp 18   SpO2 98%   Physical Exam  Constitutional: He is oriented to person, place, and time. He appears well-developed and well-nourished. No distress.  HENT:  Head: Normocephalic and atraumatic.  Eyes: Conjunctivae are normal.  Neck: Neck supple. No tracheal deviation present.  Cardiovascular: Normal rate, regular rhythm and normal heart sounds.   Pulmonary/Chest: Effort normal and breath sounds normal. No respiratory distress.  Abdominal: Soft. He exhibits no distension.  Neurological: He is alert and oriented to person, place, and time.  Skin: Skin is warm and dry.  Psychiatric: He is slowed. He exhibits a depressed mood. He expresses suicidal ideation. He expresses no suicidal plans.  Vitals reviewed.    ED Treatments / Results  Labs (all labs ordered are listed, but only abnormal results are displayed) Labs Reviewed - No data to display  EKG  EKG Interpretation None       Radiology No results found.  Procedures Procedures (including critical care time)  Medications Ordered in ED Medications  ALPRAZolam (XANAX) tablet 2 mg (not administered)  beclomethasone (QVAR) 80 MCG/ACT inhaler 2 puff (not administered)  clopidogrel (PLAVIX) tablet 75 mg (not administered)  lamoTRIgine (LAMICTAL) tablet 200 mg (not administered)  levETIRAcetam (KEPPRA) tablet 500 mg (not administered)  metoprolol tartrate (LOPRESSOR) tablet 12.5 mg (not administered)  sertraline (ZOLOFT) tablet 100 mg (not administered)  lisinopril (PRINIVIL,ZESTRIL) tablet 2.5 mg (not administered)  doxycycline (VIBRA-TABS) tablet 100 mg (not  administered)     Initial Impression / Assessment and Plan / ED Course  I have reviewed the triage vital signs and the nursing notes.  Pertinent labs & imaging results that were available during  my care of the patient were reviewed by me and considered in my medical decision making (see chart for details).  Clinical Course    55 y.o. male presents with ongoing depression and feeling like he wants to go to sleep and not ever wake up again. He was discharged from the hospital yesterday from internal medicine so he has been medically cleared for psychiatric evaluation to determine safety. TTS consulted, home meds ordered.  MEDICALLY CLEAR FOR TRANSFER OR PSYCHIATRIC ADMISSION.   Final Clinical Impressions(s) / ED Diagnoses   Final diagnoses:  Suicidal thoughts    New Prescriptions New Prescriptions   No medications on file     Leo Grosser, MD 11/21/15 1614

## 2015-11-21 NOTE — ED Notes (Signed)
Patient refused to eat lunch.

## 2015-11-21 NOTE — BH Assessment (Signed)
Tele Assessment Note   Dalton [name] is a  55 y.o. male with a history of depressive symptoms who presented voluntarily to [facility] with complaint of suicidal ideation.  Pt was discharged from [facility] on 11/20/15 after reporting an improved mood.  Pt reported as follows:  After being discharged from the hospital on 11/20/15, he noted an increase in suicidal ideation and developed a plan to jump off a bridge.  He also reported  Access to weapons (knife and pistol) that his roommate has since secured.  Pt endorsed the following symptoms: Suicidal ideation with plan and intent (jump off bridge to die); persistent and unremitting sadness; insomnia; feeling worthless and hopeless.  He also endorsed weekly use of cocaine (of varying amounts).  Last use was 11/20/15.  Pt said he decided to come in because he was scared for himself after experiencing suicidal ideation.    Pt reported that he lives with a friend.  He is on disability and receives SSI.  Pt stated that he has outpatient psychiatric care, and that he has been treated numerous times inpatient.  During assessment, Pt presented as alert and oriented.  He had fair eye contact and was cooperative.  Pt's demeanor was subdued.  Pt endorsed suicidal ideation and other depressive symptoms (see above).  He denied homicidal ideation, auditory/visual hallucination, and self-injury.  He endorsed a history of cutting (most recent episode was about a year ago).  Pt endorsed ongoing cocaine use.  Pt's speech was soft, but otherwise normal in rate and rhythm.  Pt's memory and concentration were intact.  Pt's thought processes were within normal range; thought content indicated suicidal ideation.  Insight and judgment were poor.  Impulse control was fair.  Consulted with Dr. Parke Poisson, who recommended inpatient treatment.  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 Anemia   . Arthritis   . Bronchitis, chronic (Dalton [name])   . CHF (congestive heart failure) (Dalton [name])   . Chronic back pain    Pain Clinic in [place]  . Chronic bronchitis   . Congestive heart failure (CHF) ([place])   . Coronary artery disease   . Depression   . Diabetes mellitus without complication (Dalton [name])    TYPE 2  . Frequency of urination   . GERD (gastroesophageal reflux disease)   . Headache(784.0)   . High cholesterol   . Hypertension   . Laceration of right hand 11/27/10  . Laceration of wrist 2007 BIL FOREARMS  . MI (myocardial infarction) ([place])    7, last one was in 2011  . PUD (peptic ulcer disease)    in 1990s, secondary to medication  . Seizures ([place]alton [name])    entire life, last seizure in 2011;unknown etiology-pt sts heriditary  . Shortness of breath    with exertion  . Tonsillitis, chronic    Dr. Vicki Mallet in [place]    Past Surgical History:  Procedure Laterality Date  . BACK SURGERY     3-  . BIOPSY N/A 05/30/2012   Procedure: BIOPSY;  Surgeon: Danie Binder, MD;  Location: [facility];  Service: Endoscopy;  Laterality: N/A;  . CARPAL TUNNEL RELEASE     bilateral  . COLONOSCOPY  12/28/10   SLF: (MAC)Internal hemorrhoids/four small colon polyps  . CORONARY ARTERY BYPASS GRAFT  2002   3 vessels  . ESOPHAGOGASTRODUODENOSCOPY N/A 05/30/2012   SLF: UNCONTROLLED GERD DUE TO LIFESTYLE CHOICE/WEIGHT GAIN/MILD Non-erosive  gastritis  . KNEE SURGERY     left-plate to left knee cap from accident  . LEFT HEART CATHETERIZATION WITH CORONARY ANGIOGRAM N/A 08/31/2011   Procedure: LEFT HEART CATHETERIZATION WITH CORONARY ANGIOGRAM;  Surgeon: Laverda Page, MD;  Location: [facility] CATH LAB;  Service: Cardiovascular;  Laterality: N/A;  . SAVORY DILATION  12/28/2010   SLF:(MAC)J-shaped stomach/nodular mocosa in the distal esophagus/empiric dilation 16mm    Family History:  Family History  Problem Relation Age of Onset  . Diabetes Mother   . Hypertension Mother   .  Heart attack Mother   . Hypertension Father   . Diabetes Father   . Heart attack Father   . Heart attack      mother, father, brother, sister all deceased due to MI  . Heart attack Sister   . Heart attack Brother   . Seizures Brother   . Heart failure Other   . Colon cancer Neg Hx   . Liver disease Neg Hx   . Anesthesia problems Neg Hx   . Hypotension Neg Hx   . Malignant hyperthermia Neg Hx   . Pseudochol deficiency Neg Hx   . Colon polyps Neg Hx     Social History:  reports that he has never smoked. He has never used smokeless tobacco. He reports that he uses drugs, including "Crack" cocaine and Cocaine, about 1 time per week. He reports that he does not drink alcohol.  Additional Social History:  Alcohol / Drug Use Pain Medications: See PTA Prescriptions: See PTA Over the Counter: See PTA History of alcohol / drug use?: Yes Substance #1 Name of Substance 1: Cocaine 1 - Age of First Use: 36 1 - Amount (size/oz): Varied 1 - Frequency: Weekly 1 - Duration: Ongoing 1 - Last Use / Amount: 11/20/15  CIWA: CIWA-Ar BP: 132/92 Pulse Rate: 82 COWS:    PATIENT STRENGTHS: (choose at least two) Average or above average intelligence Capable of independent living  Allergies:  Allergies  Allergen Reactions  . Gabapentin Hives  . Ibuprofen Other (See Comments)    REACTION:Stomach  upset  . Zolpidem Tartrate Other (See Comments)    REACTION: Hallucinations   . Naproxen Other (See Comments)    REACTION: unknown    Home Medications:  (Not in a hospital admission)  OB/GYN Status:  No LMP for male patient.  General Assessment Data Location of Assessment: [facility] ED TTS Assessment: In system Is this a Tele or Face-to-Face Assessment?: Tele Assessment Is this an Initial Assessment or a Re-assessment for this encounter?: Initial Assessment Marital status: Single Is patient pregnant?: No Pregnancy Status: No Living Arrangements: Non-relatives/Friends (Currently living with  plumber friend) Can pt return to current living arrangement?: Yes Admission Status: Voluntary Is patient capable of signing voluntary admission?: Yes Referral Source: Self/Family/Friend Insurance type: Medicare  Medical Screening Exam ([place]) Medical Exam completed: Yes  Crisis Care Plan Living Arrangements: Non-relatives/Friends (Currently living with plumber friend) Name of Psychiatrist: Dr. Reece Levy Name of Therapist: NA  Education Status Is patient currently in school?: No  Risk to self with the past 6 months Suicidal Ideation: Yes-Currently Present Has patient been a risk to self within the past 6 months prior to admission? : Yes Suicidal Intent: Yes-Currently Present Has patient had any suicidal intent within the past 6 months prior to admission? : Yes Is patient at risk for suicide?: Yes Suicidal Plan?: Yes-Currently Present Has patient had any suicidal plan within the past 6 months prior to admission? : No  Specify Current Suicidal Plan: Jump off bridge Access to Means: Yes What has been your use of drugs/alcohol within the last 12 months?: Cocaine, crack, marijuana Previous Attempts/Gestures: Yes How many times?: 1 (At least one reported, but 'I can't remember') Other Self Harm Risks: Hx of self-cutting Triggers for Past Attempts: Other (Comment) ("I can't remember") Intentional Self Injurious Behavior: Cutting Comment - Self Injurious Behavior: Hx of cutting, last incident about a year ago Family Suicide History: Unknown Recent stressful life event(s): Recent negative physical changes Persecutory voices/beliefs?: No Depression: Yes Depression Symptoms: Despondent, Insomnia, Loss of interest in usual pleasures, Feeling worthless/self pity, Fatigue, Isolating Substance abuse history and/or treatment for substance abuse?: Yes Suicide prevention information given to non-admitted patients: Not applicable  Risk to Others within the past 6 months Homicidal  Ideation: No Does patient have any lifetime risk of violence toward others beyond the six months prior to admission? : No Thoughts of Harm to Others: No Current Homicidal Intent: No Current Homicidal Plan: No Access to Homicidal Means: No History of harm to others?: No Assessment of Violence: None Noted Does patient have access to weapons?: No Criminal Charges Pending?: No Does patient have a court date: No Is patient on probation?: Unknown  Psychosis Hallucinations: None noted Delusions: None noted  Mental Status Report Appearance/Hygiene: In scrubs Eye Contact: Fair Motor Activity: Unremarkable Speech: Unremarkable Level of Consciousness: Alert Mood: Depressed Affect: Appropriate to circumstance Anxiety Level: None Thought Processes: Coherent, Relevant Judgement: Impaired Orientation: Person, Place, Time, Situation Obsessive Compulsive Thoughts/Behaviors: None  Cognitive Functioning Concentration: Normal Memory: Remote Intact, Recent Intact IQ: Average Insight: Poor Impulse Control: Fair Appetite: Good Sleep: Decreased Vegetative Symptoms: None  ADLScreening [facility] Assessment Services) Patient's cognitive ability adequate to safely complete daily activities?: Yes Patient able to express need for assistance with ADLs?: Yes Independently performs ADLs?: Yes (appropriate for developmental age)  Prior Inpatient Therapy Prior Inpatient Therapy: Yes Prior Therapy Dates: Numerous Prior Therapy Facilty/Provider(s): [place], [place], [facility] and others Reason for Treatment: Depression  Prior Outpatient Therapy Prior Outpatient Therapy: Yes Prior Therapy Dates: Ongoing Prior Therapy Facilty/Provider(s): Dr. Lin Landsman Reason for Treatment: Depression Does patient have an ACCT team?: No Does patient have Intensive In-House Services?  : No Does patient have [facility] services? : No Does patient have P4CC services?: No  ADL Screening (condition at time of  admission) Patient's cognitive ability adequate to safely complete daily activities?: Yes Is the patient deaf or have difficulty hearing?: No Does the patient have difficulty seeing, even when wearing glasses/contacts?: No Does the patient have difficulty concentrating, remembering, or making decisions?: No Patient able to express need for assistance with ADLs?: Yes Does the patient have difficulty dressing or bathing?: No Independently performs ADLs?: Yes (appropriate for developmental age) Does the patient have difficulty walking or climbing stairs?: No Weakness of Legs: None Weakness of Arms/Hands: None  Home Assistive Devices/Equipment Home Assistive Devices/Equipment: None  Therapy Consults (therapy consults require a physician order) PT Evaluation Needed: No OT Evalulation Needed: No SLP Evaluation Needed: No Abuse/Neglect Assessment (Assessment to be complete while patient is alone) Physical Abuse: Denies Verbal Abuse: Denies Sexual Abuse: Denies Exploitation of patient/patient's resources: Denies Self-Neglect: Yes, present (Comment) (Continues to use drugs) Values / Beliefs Cultural Requests During Hospitalization: None Spiritual Requests During Hospitalization: None Consults Spiritual Care Consult Needed: No Social Work Consult Needed: No Regulatory affairs officer (For Healthcare) Does patient have an advance directive?: No Would patient like information on creating an advanced directive?: No - patient declined information  Additional Information 1:1 In Past 12 Months?: No CIRT Risk: No Elopement Risk: No Does patient have medical clearance?: Yes     Disposition:  Disposition Initial Assessment Completed for this Encounter: Yes Disposition of Patient: Inpatient treatment program (Per Dr. Parke Poisson, Pt meets inpt criteria)  Laurena Slimmer An[place]ula Severt 11/21/2015 4:52 PM
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
## 2015-11-21 NOTE — Progress Notes (Signed)
Patient was recommended inpatient treatment by Dr. Parke Poisson on 11/21/15.  Referrals have been sent to the following hospitals: [facility]  CSW will continue to seek placement. Writer spoke with [facility] and patient is now under review at [facility]. Per [facility] patient needs his CMAT, CBC, UA, UDS updated. Spoke with RN Rod Holler and inquired if these can be ordered. PEr RN Rod Holler labs will be ordered by MD this evening.  Awaiting CMAT, CBC, UA, UDS as pt is being considered for placement at [facility].  Verlon Setting, [place] Disposition staff 11/21/2015 9:07 PM

## 2015-11-22 ENCOUNTER — Inpatient Hospital Stay (HOSPITAL_COMMUNITY)
Admission: AD | Admit: 2015-11-22 | Discharge: 2015-12-09 | DRG: 885 | Disposition: A | Discharge status: Home / Self Care | Payer: Medicare Other | Source: Intra-hospital | Attending: Psychiatry | Admitting: Psychiatry

## 2015-11-22 ENCOUNTER — Encounter (HOSPITAL_COMMUNITY): Payer: Self-pay

## 2015-11-22 DIAGNOSIS — F329 Major depressive disorder, single episode, unspecified: Secondary | ICD-10-CM | POA: Diagnosis present

## 2015-11-22 DIAGNOSIS — Z79899 Other long term (current) drug therapy: Secondary | ICD-10-CM

## 2015-11-22 DIAGNOSIS — F1414 Cocaine abuse with cocaine-induced mood disorder: Secondary | ICD-10-CM

## 2015-11-22 DIAGNOSIS — R569 Unspecified convulsions: Secondary | ICD-10-CM | POA: Diagnosis present

## 2015-11-22 DIAGNOSIS — F1494 Cocaine use, unspecified with cocaine-induced mood disorder: Secondary | ICD-10-CM

## 2015-11-22 DIAGNOSIS — F411 Generalized anxiety disorder: Secondary | ICD-10-CM | POA: Diagnosis present

## 2015-11-22 DIAGNOSIS — Z886 Allergy status to analgesic agent status: Secondary | ICD-10-CM

## 2015-11-22 DIAGNOSIS — I2510 Atherosclerotic heart disease of native coronary artery without angina pectoris: Secondary | ICD-10-CM | POA: Diagnosis present

## 2015-11-22 DIAGNOSIS — E7800 Pure hypercholesterolemia, unspecified: Secondary | ICD-10-CM | POA: Diagnosis present

## 2015-11-22 DIAGNOSIS — F331 Major depressive disorder, recurrent, moderate: Secondary | ICD-10-CM | POA: Diagnosis not present

## 2015-11-22 DIAGNOSIS — I10 Essential (primary) hypertension: Secondary | ICD-10-CM | POA: Diagnosis present

## 2015-11-22 DIAGNOSIS — E119 Type 2 diabetes mellitus without complications: Secondary | ICD-10-CM | POA: Diagnosis present

## 2015-11-22 DIAGNOSIS — Z833 Family history of diabetes mellitus: Secondary | ICD-10-CM

## 2015-11-22 DIAGNOSIS — Z590 Homelessness: Secondary | ICD-10-CM

## 2015-11-22 DIAGNOSIS — Z8249 Family history of ischemic heart disease and other diseases of the circulatory system: Secondary | ICD-10-CM | POA: Diagnosis not present

## 2015-11-22 DIAGNOSIS — Z951 Presence of aortocoronary bypass graft: Secondary | ICD-10-CM | POA: Diagnosis not present

## 2015-11-22 DIAGNOSIS — F1420 Cocaine dependence, uncomplicated: Secondary | ICD-10-CM | POA: Diagnosis present

## 2015-11-22 DIAGNOSIS — I739 Peripheral vascular disease, unspecified: Secondary | ICD-10-CM | POA: Diagnosis present

## 2015-11-22 DIAGNOSIS — K219 Gastro-esophageal reflux disease without esophagitis: Secondary | ICD-10-CM | POA: Diagnosis present

## 2015-11-22 DIAGNOSIS — G4700 Insomnia, unspecified: Secondary | ICD-10-CM | POA: Diagnosis present

## 2015-11-22 DIAGNOSIS — M549 Dorsalgia, unspecified: Secondary | ICD-10-CM | POA: Diagnosis present

## 2015-11-22 DIAGNOSIS — R45851 Suicidal ideations: Secondary | ICD-10-CM | POA: Diagnosis present

## 2015-11-22 DIAGNOSIS — Z915 Personal history of self-harm: Secondary | ICD-10-CM | POA: Diagnosis not present

## 2015-11-22 DIAGNOSIS — Z8371 Family history of colonic polyps: Secondary | ICD-10-CM | POA: Diagnosis not present

## 2015-11-22 DIAGNOSIS — Z7951 Long term (current) use of inhaled steroids: Secondary | ICD-10-CM

## 2015-11-22 DIAGNOSIS — I252 Old myocardial infarction: Secondary | ICD-10-CM

## 2015-11-22 DIAGNOSIS — F339 Major depressive disorder, recurrent, unspecified: Principal | ICD-10-CM | POA: Diagnosis present

## 2015-11-22 DIAGNOSIS — Z8711 Personal history of peptic ulcer disease: Secondary | ICD-10-CM | POA: Diagnosis not present

## 2015-11-22 DIAGNOSIS — G8929 Other chronic pain: Secondary | ICD-10-CM | POA: Diagnosis present

## 2015-11-22 DIAGNOSIS — Z7902 Long term (current) use of antithrombotics/antiplatelets: Secondary | ICD-10-CM

## 2015-11-22 DIAGNOSIS — Z888 Allergy status to other drugs, medicaments and biological substances status: Secondary | ICD-10-CM

## 2015-11-22 DIAGNOSIS — Z7982 Long term (current) use of aspirin: Secondary | ICD-10-CM

## 2015-11-22 DIAGNOSIS — Z818 Family history of other mental and behavioral disorders: Secondary | ICD-10-CM

## 2015-11-22 DIAGNOSIS — Z7984 Long term (current) use of oral hypoglycemic drugs: Secondary | ICD-10-CM

## 2015-11-22 MED ORDER — IPRATROPIUM BROMIDE 0.02 % IN SOLN
0.5000 mg | Freq: Four times a day (QID) | RESPIRATORY_TRACT | Status: DC | PRN
  Administered 2015-11-22 – 2015-11-23 (×2): 0.5 mg via RESPIRATORY_TRACT
  Filled 2015-11-22 (×3): qty 2.5

## 2015-11-22 MED ORDER — ALBUTEROL SULFATE (2.5 MG/3ML) 0.083% IN NEBU
2.5000 mg | INHALATION_SOLUTION | Freq: Four times a day (QID) | RESPIRATORY_TRACT | Status: DC | PRN
Start: 2015-11-22 — End: 2015-11-22

## 2015-11-22 MED ORDER — HYDROXYZINE HCL 25 MG PO TABS: 25.0000 mg | ORAL_TABLET | Freq: Four times a day (QID) | ORAL | Status: DC

## 2015-11-22 MED ORDER — BUDESONIDE 0.25 MG/2ML IN SUSP
0.2500 mg | Freq: Two times a day (BID) | RESPIRATORY_TRACT | Status: DC
  Filled 2015-11-22 (×3): qty 2

## 2015-11-22 MED ORDER — CLOPIDOGREL BISULFATE 75 MG PO TABS
75.0000 mg | ORAL_TABLET | Freq: Every day | ORAL | Status: DC
Start: 2015-11-22 — End: 2015-12-09
  Administered 2015-11-22 – 2015-12-09 (×17): 75 mg via ORAL
  Filled 2015-11-22 (×21): qty 1

## 2015-11-22 MED ORDER — SERTRALINE HCL 50 MG PO TABS
150.0000 mg | ORAL_TABLET | Freq: Every day | ORAL | Status: DC
Start: 2015-11-23 — End: 2015-12-09
  Administered 2015-11-23 – 2015-12-04 (×11): 150 mg via ORAL
  Administered 2015-12-05: 50 mg via ORAL
  Administered 2015-12-05: 100 mg via ORAL
  Administered 2015-12-06 – 2015-12-09 (×4): 150 mg via ORAL
  Filled 2015-11-22 (×18): qty 3

## 2015-11-22 MED ORDER — HYDROCHLOROTHIAZIDE 25 MG PO TABS
25.0000 mg | ORAL_TABLET | Freq: Every day | ORAL | Status: DC
  Administered 2015-11-22 – 2015-12-09 (×18): 25 mg via ORAL
  Filled 2015-11-22 (×20): qty 1

## 2015-11-22 MED ORDER — FLUTICASONE PROPIONATE 50 MCG/ACT NA SUSP: 2.0000 | Freq: Every day | NASAL | Status: DC | PRN

## 2015-11-22 MED ORDER — LAMOTRIGINE 200 MG PO TABS
200.0000 mg | ORAL_TABLET | Freq: Every day | ORAL | Status: DC
  Filled 2015-11-22: qty 1

## 2015-11-22 MED ORDER — FUROSEMIDE 20 MG PO TABS: 20.0000 mg | ORAL_TABLET | Freq: Every day | ORAL | Status: DC

## 2015-11-22 MED ORDER — ALBUTEROL SULFATE HFA 108 (90 BASE) MCG/ACT IN AERS
2.0000 | INHALATION_SPRAY | Freq: Four times a day (QID) | RESPIRATORY_TRACT | Status: DC | PRN
  Administered 2015-11-24 – 2015-12-07 (×6): 2 via RESPIRATORY_TRACT
  Filled 2015-11-22: qty 6.7

## 2015-11-22 MED ORDER — METFORMIN HCL 500 MG PO TABS
500.0000 mg | ORAL_TABLET | Freq: Two times a day (BID) | ORAL | Status: DC
  Administered 2015-11-22 – 2015-12-09 (×32): 500 mg via ORAL
  Filled 2015-11-22 (×38): qty 1

## 2015-11-22 MED ORDER — ALBUTEROL SULFATE (2.5 MG/3ML) 0.083% IN NEBU: 2.5000 mg | INHALATION_SOLUTION | Freq: Three times a day (TID) | RESPIRATORY_TRACT | Status: DC

## 2015-11-22 MED ORDER — IPRATROPIUM BROMIDE 0.02 % IN SOLN: 0.5000 mg | Freq: Four times a day (QID) | RESPIRATORY_TRACT | Status: DC | PRN

## 2015-11-22 MED ORDER — ALBUTEROL SULFATE HFA 108 (90 BASE) MCG/ACT IN AERS: 2.0000 | INHALATION_SPRAY | Freq: Four times a day (QID) | RESPIRATORY_TRACT | Status: DC | PRN

## 2015-11-22 MED ORDER — TAMSULOSIN HCL 0.4 MG PO CAPS
0.4000 mg | ORAL_CAPSULE | Freq: Every day | ORAL | Status: DC
  Administered 2015-11-22 – 2015-12-09 (×17): 0.4 mg via ORAL
  Filled 2015-11-22 (×19): qty 1

## 2015-11-22 MED ORDER — LORATADINE 10 MG PO TABS
10.0000 mg | ORAL_TABLET | Freq: Every day | ORAL | Status: DC
  Administered 2015-11-22 – 2015-12-09 (×17): 10 mg via ORAL
  Filled 2015-11-22 (×19): qty 1

## 2015-11-22 MED ORDER — FUROSEMIDE 40 MG PO TABS: 40.0000 mg | ORAL_TABLET | Freq: Every day | ORAL | Status: DC | PRN

## 2015-11-22 MED ORDER — LEVETIRACETAM 500 MG PO TABS
500.0000 mg | ORAL_TABLET | Freq: Two times a day (BID) | ORAL | Status: DC
Start: 2015-11-22 — End: 2015-12-09
  Administered 2015-11-22 – 2015-12-09 (×32): 500 mg via ORAL
  Filled 2015-11-22 (×38): qty 1

## 2015-11-22 MED ORDER — LISINOPRIL 2.5 MG PO TABS
2.5000 mg | ORAL_TABLET | Freq: Every day | ORAL | Status: DC
  Administered 2015-11-22 – 2015-12-09 (×17): 2.5 mg via ORAL
  Filled 2015-11-22 (×19): qty 1

## 2015-11-22 MED ORDER — BECLOMETHASONE DIPROPIONATE 80 MCG/ACT IN AERS: 2.0000 | INHALATION_SPRAY | Freq: Two times a day (BID) | RESPIRATORY_TRACT | Status: DC

## 2015-11-22 MED ORDER — TRAZODONE HCL 50 MG PO TABS
50.0000 mg | ORAL_TABLET | Freq: Every evening | ORAL | Status: DC | PRN
  Administered 2015-11-22 – 2015-11-23 (×3): 50 mg via ORAL
  Filled 2015-11-22 (×9): qty 1

## 2015-11-22 MED ORDER — HYDROXYZINE HCL 25 MG PO TABS
25.0000 mg | ORAL_TABLET | Freq: Four times a day (QID) | ORAL | Status: DC | PRN
  Administered 2015-11-24 – 2015-12-07 (×10): 25 mg via ORAL
  Filled 2015-11-22 (×10): qty 1

## 2015-11-22 MED ORDER — METOPROLOL TARTRATE 12.5 MG HALF TABLET
12.5000 mg | ORAL_TABLET | Freq: Two times a day (BID) | ORAL | Status: DC
  Administered 2015-11-22 – 2015-12-03 (×20): 12.5 mg via ORAL
  Administered 2015-12-04 (×2): via ORAL
  Administered 2015-12-05: 12.5 mg via ORAL
  Administered 2015-12-05: 18:00:00 via ORAL
  Administered 2015-12-06 – 2015-12-09 (×7): 12.5 mg via ORAL
  Filled 2015-11-22 (×37): qty 1

## 2015-11-22 MED ORDER — ALUM & MAG HYDROXIDE-SIMETH 200-200-20 MG/5ML PO SUSP: 30.0000 mL | ORAL | Status: DC | PRN

## 2015-11-22 MED ORDER — ACETAMINOPHEN 325 MG PO TABS
650.0000 mg | ORAL_TABLET | Freq: Four times a day (QID) | ORAL | Status: DC | PRN
  Administered 2015-11-23 – 2015-12-09 (×11): 650 mg via ORAL
  Filled 2015-11-22 (×11): qty 2

## 2015-11-22 MED ORDER — PREGABALIN 100 MG PO CAPS
100.0000 mg | ORAL_CAPSULE | Freq: Three times a day (TID) | ORAL | Status: DC
  Administered 2015-11-22 – 2015-12-09 (×48): 100 mg via ORAL
  Filled 2015-11-22 (×48): qty 1

## 2015-11-22 MED ORDER — FLUTICASONE PROPIONATE HFA 44 MCG/ACT IN AERO
2.0000 | INHALATION_SPRAY | Freq: Two times a day (BID) | RESPIRATORY_TRACT | Status: DC
  Administered 2015-11-22 – 2015-12-09 (×30): 2 via RESPIRATORY_TRACT
  Filled 2015-11-22: qty 10.6

## 2015-11-22 MED ORDER — LIDOCAINE 5 % EX PTCH
1.0000 | MEDICATED_PATCH | Freq: Every day | CUTANEOUS | Status: DC
  Administered 2015-11-22 – 2015-12-09 (×17): 1 via TRANSDERMAL
  Filled 2015-11-22 (×19): qty 1

## 2015-11-22 MED ORDER — ATORVASTATIN CALCIUM 10 MG PO TABS
10.0000 mg | ORAL_TABLET | Freq: Every morning | ORAL | Status: DC
  Administered 2015-11-22 – 2015-12-09 (×17): 10 mg via ORAL
  Filled 2015-11-22 (×23): qty 1

## 2015-11-22 MED ORDER — SERTRALINE HCL 100 MG PO TABS
100.0000 mg | ORAL_TABLET | Freq: Every day | ORAL | Status: DC
  Administered 2015-11-22: 100 mg via ORAL
  Filled 2015-11-22 (×2): qty 1

## 2015-11-22 MED ORDER — PANTOPRAZOLE SODIUM 40 MG PO TBEC
40.0000 mg | DELAYED_RELEASE_TABLET | Freq: Every day | ORAL | Status: DC
  Administered 2015-11-22 – 2015-12-09 (×17): 40 mg via ORAL
  Filled 2015-11-22 (×19): qty 1

## 2015-11-22 MED ORDER — MAGNESIUM HYDROXIDE 400 MG/5ML PO SUSP: 30.0000 mL | Freq: Every day | ORAL | Status: DC | PRN

## 2015-11-22 MED ORDER — POTASSIUM CHLORIDE CRYS ER 20 MEQ PO TBCR
20.0000 meq | EXTENDED_RELEASE_TABLET | Freq: Every day | ORAL | Status: DC
  Administered 2015-11-22 – 2015-12-09 (×17): 20 meq via ORAL
  Filled 2015-11-22 (×20): qty 1

## 2015-11-22 NOTE — [facility] Group Notes (Signed)
[facility] Group Notes:  (Nursing/MHT/Case Management/Adjunct)  Date:  11/22/2015  Time:  10:33 AM  Type of Therapy:  Psychoeducational Skills  Participation Level:  Did Not Attend  Participation Quality:  Did Not Attend  Affect:  Did Not Attend  Cognitive:  Did Not Attend  Insight:  None  Engagement in Group:  Did Not Attend  Modes of Intervention:  Did Not Attend  Summary of Progress/Problems: Pt did not attend patient self inventory group.   Dalton [name] Shanta 11/22/2015, 10:33 AM

## 2015-11-22 NOTE — [facility] Suicide Risk Assessment (Addendum)
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 Chest pain [R07.9] 11/12/2015  . Type 2 diabetes mellitus with vascular disease ([place]) [E11.59] 11/12/2015  . History of seizure [Z86.69] 11/12/2015  . Cocaine abuse [F14.10] 01/26/2015  . Cocaine-induced mood disorder ([place]) [F14.94] 01/26/2015  . Helicobacter pylori gastritis [B96.81] 05/30/2012  . Pure hypercholesterolemia [E78.00] 08/31/2011  . Essential hypertension, benign [I10] 08/31/2011  . 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 PAD (peripheral artery disease) ([place]) [I73.9] 08/31/2011  . GERD (gastroesophageal reflux disease) [K21.9] 12/07/2010  . Esophageal dysphagia [R13.14] 12/07/2010  . Constipation [K59.00] 12/07/2010  . Laryngopharyngeal reflux (LPR) [J38.7] 12/07/2010  . Lumbar pain with radiation down left leg [M54.5] 11/17/2010  . CARPAL TUNNEL SYNDROME [G56.00] 07/14/2009  . SHOULDER IMPINGEMENT SYNDROME, LEFT [M75.80] 05/05/2009   Subjective Data:   55 yo M with depression, CAD, hypertension, DM, who was transferred to [facility] for SI after admission for medical evaluation of chest pain in the context of cocaine use.   Patient states that he has had SI with plan to blow his head off for a month. He lives with his friend and this friend took out a Chiropractor. He reports no significant change in his SI,  although he is able to contract safety in the unit. He feels that he wants to "sleep and not wake up." He feels that those thoughts are getting worse since the last month. He has been overwhelmed by his cardiac issues and his chronic pain. He has seen his psychiatrist a couple of months ago to "get straight" and has been taking sertraline.   He endorses insomnia. He denies HI. He denies AH/VH. He uses cocaine "every now and then" ; last use a few days ago. He denies any recreational use, but he did it when he was with "wrong people." He denies alcohol use.   Continued Clinical Symptoms:    The "Alcohol Use Disorders Identification Test", Guidelines for Use in Primary Care, Second Edition.  World Pharmacologist [facility]). Score between 0-7:  no or low risk or alcohol related problems. Score between 8-15:  moderate risk of alcohol related problems. Score between 16-19:  high risk of alcohol related problems. Score 20 or above:  warrants further diagnostic evaluation for alcohol dependence and treatment.   CLINICAL FACTORS:   Depression:   Anhedonia Comorbid alcohol abuse/dependence Hopelessness Impulsivity Insomnia   Musculoskeletal: Strength & Muscle Tone: within normal limits Gait & Station: normal Patient leans: N/A  Psychiatric Specialty Exam: Physical Exam  Nursing note and vitals reviewed. Constitutional: He is oriented to person, place, and time. He appears well-developed and well-nourished.  Neurological: He is alert and oriented to person, place, and time.  No tremors  Skin: Skin is warm and dry.    Review of Systems  Constitutional: Positive for malaise/fatigue.  Cardiovascular: Positive for chest pain and palpitations.  Neurological: Positive for dizziness. Negative for tremors.  Psychiatric/Behavioral: Positive for depression, substance abuse and  suicidal ideas. Negative for hallucinations. The patient has insomnia. The patient is not nervous/anxious.     Blood  pressure 100/69, pulse 60, temperature 98.3 F (36.8 C), temperature source Oral, resp. rate 18, height 5\' 10"  (1.778 m), weight 228 lb 2.8 oz (103.5 kg), SpO2 100 %.Body mass index is 32.74 kg/m.  General Appearance: Disheveled  Eye Contact:  Minimal  Speech:  Clear and Coherent  Volume:  Normal  Mood:  Depressed  Affect:  Blunt  Thought Process:  Coherent and Goal Directed  Orientation:  Full (Time, Place, and Person)  Thought Content:  Logical  Suicidal Thoughts:  Yes.  without intent/plan  Homicidal Thoughts:  No  Memory:  Immediate;   Fair Recent;   Fair Remote;   Fair  Judgement:  Fair  Insight:  Present  Psychomotor Activity:  Normal  Concentration:  Concentration: Fair and Attention Span: Fair  Recall:  [employer] of Knowledge:  Fair  Language:  Fair  Akathisia:  No  Handed:  Right  AIMS (if indicated):     Assets:  Communication Skills Desire for Improvement  ADL's:  Intact  Cognition:  WNL  Sleep:  Number of Hours: 2.75      COGNITIVE FEATURES THAT CONTRIBUTE TO RISK:  Closed-mindedness    SUICIDE RISK:   Moderate:  Frequent suicidal ideation with limited intensity, and duration, some specificity in terms of plans, no associated intent, good self-control, limited dysphoria/symptomatology, some risk factors present, and identifiable protective factors, including available and accessible social support.   PLAN OF CARE: Patient will be admitted to inpatient psychiatric unit for stabilization and safety. Will provide and encourage milieu participation. Provide medication management and maked adjustments as needed.  Will follow daily.   I certify that inpatient services furnished can reasonably be expected to improve the patient's condition.  Norman Clay, MD 11/22/2015, 10:39 AM

## 2015-11-22 NOTE — Plan of Care (Signed)
Problem: Safety: Goal: Periods of time without injury will increase Outcome: Progressing Pt currently remains injury free on the unit

## 2015-11-22 NOTE — ED Provider Notes (Signed)
1:04 AM   Nursing informed this examiner that the patient was accepted for transfer to behavioral health. Patient was reassessed shortly before transfer and had no new complaints or concerns.  Patient's lungs were clear and abdomen was nontender.   Patient informed me that he was feeling ready to get his help. Patient had no other questions or concerns and patient transferred in stable condition.   Gwenyth Allegra Tegeler, MD 11/22/15 [phone]

## 2015-11-22 NOTE — [facility]HH Group Notes (Signed)
[facility] Group Notes: (Clinical Social Work)   11/22/2015      Type of Therapy:  Group Therapy   Participation Level:  Did Not Attend despite MHT prompting   Selmer Dominion, LCSW 11/22/2015, 12:48 PM

## 2015-11-22 NOTE — Progress Notes (Signed)
Nursing Progress Note: 7p-7a D: Pt currently presents with a sad affect and appropriate behavior. Pt reports to writer that their goal is to "not be in anymore pain." Pt reports "okay" sleep with current medication regimen and early admission time. Rates his day a 4 out of 10 due to late admission. Nebulizing treatment administered to pt. Will report to am nurse.  A: Pt provided with medications per providers orders. Pt's labs and vitals were monitored throughout the night. Pt supported emotionally and encouraged to express concerns and questions. Pt educated on medications.  R: Pt's safety ensured with 15 minute and environmental checks. Pt currently denies SI/HI/Self Harm and A/V hallucinations. Pt verbally agrees to seek staff if SI/HI or A/VH occurs and to consult with staff before acting on any harmful thoughts. Will continue POC.

## 2015-11-22 NOTE — H&P (Signed)
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 Chest pain [R07.9] 11/12/2015  . Type 2 diabetes mellitus with vascular disease ([name]) [E11.59] 11/12/2015  . History of seizure [Z86.69] 11/12/2015  . Cocaine abuse [F14.10] 01/26/2015  . Cocaine-induced mood disorder ([place]) [F14.94] 01/26/2015  . Helicobacter pylori gastritis [B96.81] 05/30/2012  . Pure hypercholesterolemia [E78.00] 08/31/2011  . Essential hypertension, benign [I10] 08/31/2011  . 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 PAD (peripheral artery disease) ([place]) [I73.9] 08/31/2011  . GERD (gastroesophageal reflux disease) [K21.9] 12/07/2010  . Esophageal dysphagia [R13.14] 12/07/2010  . Constipation [K59.00] 12/07/2010  . Laryngopharyngeal reflux (LPR) [J38.7] 12/07/2010  . Lumbar pain with radiation down left leg [M54.5] 11/17/2010  . CARPAL TUNNEL SYNDROME [G56.00] 07/14/2009  . SHOULDER IMPINGEMENT SYNDROME, LEFT [M75.80] 05/05/2009   History of Present Illness:  55 yo M with depression, CAD, hypertension, DM, who was transferred to [facility] for SI after admission for medical evaluation of chest pain in the context of cocaine use.   Patient states that he has had SI with plan to blow his head off for a month. He lives with his friend and this friend took out a Chiropractor. He reports no significant change in his SI, although he is able to contract safety in the unit. He feels that he wants to "sleep and not wake up." He feels that those thoughts are getting worse since the last month. He has been overwhelmed by his cardiac issues and his chronic pain. He has seen his  psychiatrist a couple of months ago to "get straight" and has been taking sertraline.   He endorses insomnia. He denies HI. He denies AH/VH. He uses cocaine "every now and then" ; last use a few days ago. He denies any recreational use, but he did it when he was with "wrong people." He denies alcohol use.   Associated Signs/Symptoms: Depression Symptoms:  depressed mood, anhedonia, insomnia, fatigue, hopelessness, suicidal thoughts without plan, (Hypo) Manic Symptoms:  denies Anxiety Symptoms:  denies Psychotic Symptoms:  denies PTSD Symptoms: denies Total Time spent with patient: 45 minutes  Past Psychiatric History:  Dx: depression, cocaine abuse and substance induced mood disorder.  Last suicidal attempt 2007 by cutting his forearms.  Patient has a multiple acute psychiatric hospitalization at behavioral health Hospital since 2003 and his recent hospitalization was in December 2016, at behavioral health [facility] observation unit.  Is the patient at risk to self? Yes.    Has the patient been a risk to self in the past 6 months? Yes.    Has the patient been a risk to self within the distant past? Yes.    Is the patient a risk to others? No.  Has the patient been a risk to others in the past 6 months? No.  Has the patient been a risk to others within the distant past? No.   Prior Inpatient Therapy:   Prior Outpatient Therapy:    Alcohol Screening: Patient refused Alcohol Screening Tool: Yes Substance Abuse History in the last 12 months:  Yes.   Consequences of Substance Abuse: worsening mood symptoms Previous Psychotropic Medications: Yes  Psychological Evaluations: Yes  Past Medical History:  Past Medical History:  Diagnosis Date  . Anemia   .  Arthritis   . Bronchitis, chronic ([place])   . CHF (congestive heart failure) ([place])   . Chronic back pain    Pain Clinic in [place]  . Chronic bronchitis   . Congestive heart failure (CHF) ([place])   . Coronary artery disease    . Depression   . Diabetes mellitus without complication ([place])    TYPE 2  . Frequency of urination   . GERD (gastroesophageal reflux disease)   . Headache(784.0)   . High cholesterol   . Hypertension   . Laceration of right hand 11/27/10  . Laceration of wrist 2007 BIL FOREARMS  . MI (myocardial infarction) ([place])    7, last one was in 2011  . PUD (peptic ulcer disease)    in 1990s, secondary to medication  . Seizures ([place])    entire life, last seizure in 2011;unknown etiology-pt sts heriditary  . Shortness of breath    with exertion  . Tonsillitis, chronic    Dr. Vicki Mallet in [place]    Past Surgical History:  Procedure Laterality Date  . BACK SURGERY     3-  . BIOPSY N/A 05/30/2012   Procedure: BIOPSY;  Surgeon: Danie Binder, MD;  Location: [facility];  Service: Endoscopy;  Laterality: N/A;  . CARPAL TUNNEL RELEASE     bilateral  . COLONOSCOPY  12/28/10   SLF: (MAC)Internal hemorrhoids/four small colon polyps  . CORONARY ARTERY BYPASS GRAFT  2002   3 vessels  . ESOPHAGOGASTRODUODENOSCOPY N/A 05/30/2012   SLF: UNCONTROLLED GERD DUE TO LIFESTYLE CHOICE/WEIGHT GAIN/MILD Non-erosive gastritis  . KNEE SURGERY     left-plate to left knee cap from accident  . LEFT HEART CATHETERIZATION WITH CORONARY ANGIOGRAM N/A 08/31/2011   Procedure: LEFT HEART CATHETERIZATION WITH CORONARY ANGIOGRAM;  Surgeon: Laverda Page, MD;  Location: [facility] CATH LAB;  Service: Cardiovascular;  Laterality: N/A;  . SAVORY DILATION  12/28/2010   SLF:(MAC)J-shaped stomach/nodular mocosa in the distal esophagus/empiric dilation 8m   Family History:  Family History  Problem Relation Age of Onset  . Diabetes Mother   . Hypertension Mother   . Heart attack Mother   . Hypertension Father   . Diabetes Father   . Heart attack Father   . Heart attack      mother, father, brother, sister all deceased due to MI  . Heart attack Sister   . Heart attack Brother   . Seizures Brother   . Heart failure Other   .  Colon cancer Neg Hx   . Liver disease Neg Hx   . Anesthesia problems Neg Hx   . Hypotension Neg Hx   . Malignant hyperthermia Neg Hx   . Pseudochol deficiency Neg Hx   . Colon polyps Neg Hx    Family Psychiatric  History: denies Tobacco Screening: Have you used any form of tobacco in the last 30 days? (Cigarettes, Smokeless Tobacco, Cigars, and/or Pipes): No Social History:  History  Alcohol Use No    Comment: Occasions.     History  Drug Use  . Frequency: 1.0 time per week  . Types: "Crack" cocaine, Cocaine    Comment: history of cocaine, etoh, marijuana but denies any the last several years-12 yrs ago    Additional Social History:  homeless, stayed at a friend's house Never married, has two children On disability for 20 years    Pain Medications: See PTA Prescriptions: See PTA Over the Counter: denies History of alcohol / drug use?: Yes Longest period of sobriety (when/how  long): about 10-15 years Negative Consequences of Use: Personal relationships, Financial Name of Substance 1: Cocaine 1 - Age of First Use: 36 1 - Amount (size/oz): Varied 1 - Frequency: Weekly 1 - Duration: Ongoing 1 - Last Use / Amount: 11/20/15                  Allergies:   Allergies  Allergen Reactions  . Gabapentin Hives  . Ibuprofen Other (See Comments)    REACTION:Stomach  upset  . Zolpidem Tartrate Other (See Comments)    REACTION: Hallucinations   . Naproxen Other (See Comments)    REACTION: unknown   Lab Results:  Results for orders placed or performed during the hospital encounter of 11/21/15 (from the past 48 hour(s))  CBC with Differential     Status: Abnormal   Collection Time: 11/21/15  8:50 PM  Result Value Ref Range   WBC 5.7 4.0 - 10.5 K/uL   RBC 4.47 4.22 - 5.81 MIL/uL   Hemoglobin 13.1 13.0 - 17.0 g/dL   HCT 38.6 (L) 39.0 - 52.0 %   MCV 86.4 78.0 - 100.0 fL   MCH 29.3 26.0 - 34.0 pg   MCHC 33.9 30.0 - 36.0 g/dL   RDW 14.3 11.5 - 15.5 %   Platelets 217 150  - 400 K/uL   Neutrophils Relative % 49 %   Neutro Abs 2.8 1.7 - 7.7 K/uL   Lymphocytes Relative 38 %   Lymphs Abs 2.1 0.7 - 4.0 K/uL   Monocytes Relative 9 %   Monocytes Absolute 0.5 0.1 - 1.0 K/uL   Eosinophils Relative 4 %   Eosinophils Absolute 0.2 0.0 - 0.7 K/uL   Basophils Relative 0 %   Basophils Absolute 0.0 0.0 - 0.1 K/uL  Basic metabolic panel     Status: None   Collection Time: 11/21/15  8:50 PM  Result Value Ref Range   Sodium 138 135 - 145 mmol/L   Potassium 3.8 3.5 - 5.1 mmol/L   Chloride 105 101 - 111 mmol/L   CO2 26 22 - 32 mmol/L   Glucose, Bld 86 65 - 99 mg/dL   BUN 8 6 - 20 mg/dL   Creatinine, Ser 1.04 0.61 - 1.24 mg/dL   Calcium 9.3 8.9 - 10.3 mg/dL   GFR calc non Af Amer >60 >60 mL/min   GFR calc Af Amer >60 >60 mL/min    Comment: (NOTE) The eGFR has been calculated using the CKD EPI equation. This calculation has not been validated in all clinical situations. eGFR's persistently <60 mL/min signify possible Chronic Kidney Disease.    Anion gap 7 5 - 15  Acetaminophen level     Status: Abnormal   Collection Time: 11/21/15  8:50 PM  Result Value Ref Range   Acetaminophen (Tylenol), Serum <10 (L) 10 - 30 ug/mL    Comment:        THERAPEUTIC CONCENTRATIONS VARY SIGNIFICANTLY. A RANGE OF 10-30 ug/mL MAY BE AN EFFECTIVE CONCENTRATION FOR MANY PATIENTS. HOWEVER, SOME ARE BEST TREATED AT CONCENTRATIONS OUTSIDE THIS RANGE. ACETAMINOPHEN CONCENTRATIONS >150 ug/mL AT 4 HOURS AFTER INGESTION AND >50 ug/mL AT 12 HOURS AFTER INGESTION ARE OFTEN ASSOCIATED WITH TOXIC REACTIONS.   Ethanol     Status: None   Collection Time: 11/21/15  8:50 PM  Result Value Ref Range   Alcohol, Ethyl (B) <5 <5 mg/dL    Comment:        LOWEST DETECTABLE LIMIT FOR SERUM ALCOHOL IS 5 mg/dL FOR MEDICAL PURPOSES  ONLY   Salicylate level     Status: None   Collection Time: 11/21/15  8:50 PM  Result Value Ref Range   Salicylate Lvl <4.0 2.8 - 30.0 mg/dL  Urinalysis, Routine w  reflex microscopic (not at [facility])     Status: None   Collection Time: 11/21/15 11:01 PM  Result Value Ref Range   Color, Urine YELLOW YELLOW   APPearance CLEAR CLEAR   Specific Gravity, Urine 1.019 1.005 - 1.030   pH 7.0 5.0 - 8.0   Glucose, UA NEGATIVE NEGATIVE mg/dL   Hgb urine dipstick NEGATIVE NEGATIVE   Bilirubin Urine NEGATIVE NEGATIVE   Ketones, ur NEGATIVE NEGATIVE mg/dL   Protein, ur NEGATIVE NEGATIVE mg/dL   Nitrite NEGATIVE NEGATIVE   Leukocytes, UA NEGATIVE NEGATIVE    Comment: MICROSCOPIC NOT DONE ON URINES WITH NEGATIVE PROTEIN, BLOOD, LEUKOCYTES, NITRITE, OR GLUCOSE <1000 mg/dL.  Rapid urine drug screen (hospital performed)     Status: Abnormal   Collection Time: 11/21/15 11:01 PM  Result Value Ref Range   Opiates NONE DETECTED NONE DETECTED   Cocaine POSITIVE (A) NONE DETECTED   Benzodiazepines POSITIVE (A) NONE DETECTED   Amphetamines NONE DETECTED NONE DETECTED   Tetrahydrocannabinol NONE DETECTED NONE DETECTED   Barbiturates NONE DETECTED NONE DETECTED    Comment:        DRUG SCREEN FOR MEDICAL PURPOSES ONLY.  IF CONFIRMATION IS NEEDED FOR ANY PURPOSE, NOTIFY LAB WITHIN 5 DAYS.        LOWEST DETECTABLE LIMITS FOR URINE DRUG SCREEN Drug Class       Cutoff (ng/mL) Amphetamine      1000 Barbiturate      200 Benzodiazepine   981 Tricyclics       191 Opiates          300 Cocaine          300 THC              50     Blood Alcohol level:  Lab Results  Component Value Date   ETH <5 11/21/2015   ETH <5 47/82/9562    Metabolic Disorder Labs:  Lab Results  Component Value Date   HGBA1C 5.3 11/14/2015   MPG 105 11/14/2015   MPG 123 10/24/2008   No results found for: PROLACTIN Lab Results  Component Value Date   CHOL  04/17/2010    185        ATP III CLASSIFICATION:  <200     mg/dL   Desirable  200-239  mg/dL   Borderline High  >=240    mg/dL   High          TRIG 75 04/17/2010   HDL 44 04/17/2010   CHOLHDL 4.2 04/17/2010   VLDL 15 04/17/2010    LDLCALC (H) 04/17/2010    126        Total Cholesterol/HDL:CHD Risk Coronary Heart Disease Risk Table                     Men   Women  1/2 Average Risk   3.4   3.3  Average Risk       5.0   4.4  2 X Average Risk   9.6   7.1  3 X Average Risk  23.4   11.0        Use the calculated Patient Ratio above and the CHD Risk Table to determine the patient's CHD Risk.        ATP III  CLASSIFICATION (LDL):  <100     mg/dL   Optimal  100-129  mg/dL   Near or Above                    Optimal  130-159  mg/dL   Borderline  160-189  mg/dL   High  >190     mg/dL   Very High   LDLCALC (H) 10/24/2008    116        Total Cholesterol/HDL:CHD Risk Coronary Heart Disease Risk Table                     Men   Women  1/2 Average Risk   3.4   3.3  Average Risk       5.0   4.4  2 X Average Risk   9.6   7.1  3 X Average Risk  23.4   11.0        Use the calculated Patient Ratio above and the CHD Risk Table to determine the patient's CHD Risk.        ATP III CLASSIFICATION (LDL):  <100     mg/dL   Optimal  100-129  mg/dL   Near or Above                    Optimal  130-159  mg/dL   Borderline  160-189  mg/dL   High  >190     mg/dL   Very High    Current Medications: Current Facility-Administered Medications  Medication Dose Route Frequency Provider Last Rate Last Dose  . acetaminophen (TYLENOL) tablet 650 mg  650 mg Oral Q6H PRN Laverle Hobby, PA-C      . albuterol (PROVENTIL) (2.5 MG/3ML) 0.083% nebulizer solution 2.5 mg  2.5 mg Nebulization Q6H PRN Laverle Hobby, PA-C      . alum & mag hydroxide-simeth (MAALOX/MYLANTA) 200-200-20 MG/5ML suspension 30 mL  30 mL Oral Q4H PRN Laverle Hobby, PA-C      . atorvastatin (LIPITOR) tablet 10 mg  10 mg Oral q morning - 10a Spencer E Simon, PA-C      . clopidogrel (PLAVIX) tablet 75 mg  75 mg Oral QAC breakfast Laverle Hobby, PA-C   75 mg at 11/22/15 0650  . fluticasone (FLONASE) 50 MCG/ACT nasal spray 2 spray  2 spray Each Nare Daily PRN Laverle Hobby, PA-C      . fluticasone (FLOVENT HFA) 44 MCG/ACT inhaler 2 puff  2 puff Inhalation BID Jenne Campus, MD   2 puff at 11/22/15 6789  . hydrochlorothiazide (HYDRODIURIL) tablet 25 mg  25 mg Oral Daily Laverle Hobby, PA-C   25 mg at 11/22/15 3810  . hydrOXYzine (ATARAX/VISTARIL) tablet 25 mg  25 mg Oral Q6H PRN Laverle Hobby, PA-C      . ipratropium (ATROVENT) nebulizer solution 0.5 mg  0.5 mg Nebulization Q6H PRN Laverle Hobby, PA-C      . levETIRAcetam (KEPPRA) tablet 500 mg  500 mg Oral Q12H Laverle Hobby, PA-C   500 mg at 11/22/15 1751  . lidocaine (LIDODERM) 5 % 1 patch  1 patch Transdermal Daily Norman Clay, MD      . lisinopril (PRINIVIL,ZESTRIL) tablet 2.5 mg  2.5 mg Oral Daily Laverle Hobby, PA-C   2.5 mg at 11/22/15 0258  . loratadine (CLARITIN) tablet 10 mg  10 mg Oral Daily Laverle Hobby, PA-C   10 mg at 11/22/15  0823  . magnesium hydroxide (MILK OF MAGNESIA) suspension 30 mL  30 mL Oral Daily PRN Laverle Hobby, PA-C      . metFORMIN (GLUCOPHAGE) tablet 500 mg  500 mg Oral BID WC Laverle Hobby, PA-C   500 mg at 11/22/15 0824  . metoprolol tartrate (LOPRESSOR) tablet 12.5 mg  12.5 mg Oral BID Laverle Hobby, PA-C   12.5 mg at 11/22/15 7824  . pantoprazole (PROTONIX) EC tablet 40 mg  40 mg Oral Daily Laverle Hobby, PA-C   40 mg at 11/22/15 2353  . potassium chloride SA (K-DUR,KLOR-CON) CR tablet 20 mEq  20 mEq Oral Daily Laverle Hobby, PA-C   20 mEq at 11/22/15 6144  . pregabalin (LYRICA) capsule 100 mg  100 mg Oral TID Laverle Hobby, PA-C   100 mg at 11/22/15 3154  . [START ON 11/23/2015] sertraline (ZOLOFT) tablet 150 mg  150 mg Oral Daily Norman Clay, MD      . tamsulosin (FLOMAX) capsule 0.4 mg  0.4 mg Oral QPC breakfast Laverle Hobby, PA-C      . traZODone (DESYREL) tablet 50 mg  50 mg Oral QHS,MR X 1 Laverle Hobby, PA-C       PTA Medications: Prescriptions Prior to Admission  Medication Sig Dispense Refill Last Dose  . albuterol (PROAIR HFA)  108 (90 BASE) MCG/ACT inhaler Inhale 2 puffs into the lungs every 6 (six) hours as needed for wheezing (for shortness of breath).   Past Week at Unknown time  . albuterol (PROVENTIL) (2.5 MG/3ML) 0.083% nebulizer solution Take 3 mLs (2.5 mg total) by nebulization 2 (two) times daily. (Patient taking differently: Take 2.5 mg by nebulization 3 (three) times daily. ) 75 mL  11/21/2015 at am  . alprazolam (XANAX) 2 MG tablet Take 2 mg by mouth 2 (two) times daily.   11/21/2015 at am  . aspirin EC 81 MG tablet Take 1 tablet (81 mg total) by mouth daily. 30 tablet 0 11/21/2015 at am  . atorvastatin (LIPITOR) 10 MG tablet Take 1 tablet (10 mg total) by mouth every morning.   11/21/2015 at am  . beclomethasone (QVAR) 80 MCG/ACT inhaler Inhale 2 puffs into the lungs 2 (two) times daily. (Patient taking differently: Inhale 2 puffs into the lungs 2 (two) times daily as needed. ) 1 Inhaler 12 Past Week at Unknown time  . cetirizine (ZYRTEC) 10 MG tablet Take 10 mg by mouth daily as needed for allergies or rhinitis. Reported on 04/16/2015   [facility] at [facility]  . clopidogrel (PLAVIX) 75 MG tablet Take 75 mg by mouth daily before breakfast.   11/21/2015 at am  . diclofenac sodium (VOLTAREN) 1 % GEL Place 1 application onto the skin 4 (four) times daily as needed.   Past Month at Unknown time  . doxycycline (VIBRA-TABS) 100 MG tablet Take 1 tablet (100 mg total) by mouth every 12 (twelve) hours. 20 tablet 0 11/21/2015 at Unknown time  . esomeprazole (NEXIUM) 40 MG capsule Take 1 capsule (40 mg total) by mouth 2 (two) times daily. (Patient not taking: Reported on 11/21/2015) 1 capsule 0 Not Taking at Unknown time  . fluticasone (FLONASE) 50 MCG/ACT nasal spray Place 2 sprays into both nostrils daily as needed for allergies.    Past Week at Unknown time  . furosemide (LASIX) 40 MG tablet Take 1 tablet (40 mg total) by mouth daily as needed for fluid or edema. 30 tablet 0 Past Week at Unknown time  .  hydrochlorothiazide (HYDRODIURIL) 25  MG tablet Take 25 mg by mouth daily.  0 11/21/2015 at am  . hydrOXYzine (ATARAX/VISTARIL) 25 MG tablet Take 1 tablet (25 mg total) by mouth every 6 (six) hours. 12 tablet 0 Past Week at Unknown time  . ipratropium (ATROVENT) 0.02 % nebulizer solution Take 0.5 mg by nebulization every 6 (six) hours as needed for wheezing or shortness of breath.   Past Week at Unknown time  . lamoTRIgine (LAMICTAL) 200 MG tablet Take 1 tablet (200 mg total) by mouth daily. 30 tablet 0 11/21/2015 at am  . levETIRAcetam (KEPPRA) 500 MG tablet Take 1 tablet (500 mg total) by mouth every 12 (twelve) hours.   11/21/2015 at am  . lisinopril (ZESTRIL) 2.5 MG tablet Take 1 tablet (2.5 mg total) by mouth daily. 30 tablet 0 11/21/2015 at am  . metFORMIN (GLUCOPHAGE) 1000 MG tablet Take 0.5 tablets (500 mg total) by mouth daily with breakfast. (Patient taking differently: Take 500 mg by mouth 2 (two) times daily with a meal. )   11/21/2015 at am  . metoprolol tartrate (LOPRESSOR) 25 MG tablet Take 0.5 tablets (12.5 mg total) by mouth 2 (two) times daily.   11/21/2015 at 1400  . omeprazole (PRILOSEC) 20 MG capsule Take 20 mg by mouth 2 (two) times daily.  0 11/21/2015 at am  . oxyCODONE-acetaminophen (PERCOCET) 10-325 MG tablet Take 1 tablet by mouth every 6 (six) hours as needed for pain.   11/12/2015 at am  . potassium chloride SA (K-DUR,KLOR-CON) 20 MEQ tablet Take 1 tablet (20 mEq total) by mouth daily. 30 tablet 0 11/21/2015 at am  . pregabalin (LYRICA) 100 MG capsule Take 100-200 mg by mouth 3 (three) times daily as needed (for shooting pains in feet).    11/21/2015 at am  . sertraline (ZOLOFT) 100 MG tablet Take 1 tablet (100 mg total) by mouth daily. 1 tablet 0 11/21/2015 at am  . tamsulosin (FLOMAX) 0.4 MG CAPS capsule Take 1 capsule (0.4 mg total) by mouth daily after breakfast. 30 capsule  11/21/2015 at Unknown time    Musculoskeletal: Strength & Muscle Tone: within normal limits Gait & Station: normal Patient leans:  N/A  Psychiatric Specialty Exam: Physical Exam  Nursing note and vitals reviewed. Constitutional: He is oriented to person, place, and time. He appears well-developed and well-nourished.  Neurological: He is alert and oriented to person, place, and time.  No tremors  Skin: Skin is warm and dry.    Review of Systems  Constitutional: Positive for malaise/fatigue.  Cardiovascular: Positive for chest pain and palpitations.  Musculoskeletal: Positive for back pain and joint pain.  Neurological: Positive for dizziness and headaches. Negative for tremors.  Psychiatric/Behavioral: Positive for depression, substance abuse and suicidal ideas. Negative for hallucinations. The patient has insomnia. The patient is not nervous/anxious.     Blood pressure 100/69, pulse 60, temperature 98.3 F (36.8 C), temperature source Oral, resp. rate 18, height 5' 10" (1.778 m), weight 228 lb 2.8 oz (103.5 kg), SpO2 100 %.Body mass index is 32.74 kg/m.  General Appearance: Disheveled  Eye Contact:  Minimal  Speech:  Clear and Coherent  Volume:  Normal  Mood:  Depressed  Affect:  Blunt  Thought Process:  Coherent and Goal Directed  Orientation:  Full (Time, Place, and Person)  Thought Content:  Logical  Suicidal Thoughts:  Yes.  without intent/plan  Homicidal Thoughts:  No  Memory:  Immediate;   Fair Recent;   Fair Remote;   Fair  Judgement:  Fair  Insight:  Present  Psychomotor Activity:  Normal  Concentration:  Concentration: Fair and Attention Span: Fair  Recall:  [employer] of Knowledge:  Fair  Language:  Fair  Akathisia:  NA  Handed:  Right  AIMS (if indicated):     Assets:  Communication Skills Desire for Improvement  ADL's:  Intact  Cognition:  WNL  Sleep:  Number of Hours: 2.75   Assessment 55 yo M with cocaine use disorder, depression, CAD, hypertension, DM, chronic back pain, seizure per report, who was transferred to [facility] for SI after admission for medical evaluation of chest pain in  the context of cocaine use.   # Substance induced mood disorder # r/o MDD Patient endorses neurovegetative symptoms including passive SI in the context of cocaine use and being overwhelmed with his cardiac condition. Will increase sertraline to optimize its effect.   # Cocaine use disorder Patient lacks insight into his use, although he is motivated for sobriety. Will continue motivational interview. Will have continued discussion regarding his disposition.   Plan - Increase sertraline 150 mg daily - Continue Trazodone 50 mg qhsprn for insomnia - Continue hydroxyzine prn for anxiety - Continue management for his medical condition including Keppra 500 mg BID - lidocaine patch for his back pain - Admit for crisis management and stabilization. - Medication management to reduce current symptoms to base line and improve the patient's overall level of functioning. - Monitor for the adverse effect of the medications and anger outbursts - Continue 15 minutes observation for safety concerns - Encouraged to participate in milieu therapy and group therapy counseling sessions and also work with coping skills -  Develop treatment plan to decrease risk of relapse upon discharge and to reduce the need for readmission. -  Psycho-social education regarding relapse prevention and self care. - Health care follow up as needed for medical problems. - Restart home medications where appropriate.  Treatment Plan Summary: Plan as above  Observation Level/Precautions:  15 minute checks  Laboratory:  as needed  Psychotherapy:  Individual and group  Medications:  As above  Consultations:  As needed  Discharge Concerns:  -  Estimated LOS:5-7 days  Other:     Physician Treatment Plan for Primary Diagnosis: Psychoactive substance-induced mood disorder ([place]) Long Term Goal(s): Improvement in symptoms so as ready for discharge  Short Term Goals: Ability to identify changes in lifestyle to reduce recurrence of  condition will improve and Ability to demonstrate self-control will improve  Physician Treatment Plan for Secondary Diagnosis: Principal Problem:   Psychoactive substance-induced mood disorder ([place])  Long Term Goal(s): Improvement in symptoms so as ready for discharge  Short Term Goals: Ability to identify changes in lifestyle to reduce recurrence of condition will improve and Ability to identify and develop effective coping behaviors will improve  I certify that inpatient services furnished can reasonably be expected to improve the patient's condition.    Norman Clay, MD 9/30/201710:25 AM

## 2015-11-22 NOTE — Progress Notes (Signed)
[facility] Notes:  (Nursing/MHT/Case Management/Adjunct)  Date:  11/22/2015  Time:  2045 Type of Therapy:  wrap up group  Participation Level:  Active  Participation Quality:  Appropriate, Attentive, Sharing and Supportive  Affect:  Appropriate  Cognitive:  Appropriate  Insight:  Improving  Engagement in Group:  Engaged  Modes of Intervention:  Clarification, Education and Support  Summary of Progress/Problems:  Dalton [name] 11/22/2015, 10:33 PM

## 2015-11-22 NOTE — Progress Notes (Signed)
Patient ID: Dalton [name], male   DOB: 01/24/1961, 55 y.o.   MRN: [id number]   55 year old male presents from [facility] ED with thoughts of self harm and complaints of substance abuse. Admission completed by chart review and interpersonal communication. Pt affect sullen, forwards little to Probation officer. Pt presented to [facility] ED four days ago with complaints of chest pain. Pt seen by MD, pericarditis diagnosis. Pt then discharged from ED, returned later. Pt then reports that he attempted to stab himself but was stopped by a friend. Pt reported passive SI plan to jump off a bridge. Pt reports history of 2 suicide attempts and cocaine abuse. Pt has an extensive medical history, see history. Pt currently denies SI/HI and AVH. Reports none "right now." Pt currently in no distress, gait steady but reports history of falls. Pt moderate fall risk due to history of seizures, falls and that he currently is on an anticoagulant. Reports use of cane at home and home care services. Consents signed, skin/belongings search completed and pt oriented to unit. Pt given the opportunity to express concerns and ask questions. Pt given toiletries. Will continue to monitor. Pt can no longer return to previous living arrangements post discharge, wishes to find another housing option. Will continue POC.

## 2015-11-22 NOTE — Tx Team (Signed)
Initial Treatment Plan 11/22/2015 2:49 AM Dalton [name] [id number]    PATIENT STRESSORS: Financial difficulties Loss of support; moving locations recently Substance abuse   PATIENT STRENGTHS: Ability for insight Average or above average intelligence Communication skills General fund of knowledge   PATIENT IDENTIFIED PROBLEMS: Substance abuse  Suicide Risk   "things aren't going right, I've given up" - depression                 DISCHARGE CRITERIA:  Ability to meet basic life and health needs Improved stabilization in mood, thinking, and/or behavior Withdrawal symptoms are absent or subacute and managed without 24-hour nursing intervention  PRELIMINARY DISCHARGE PLAN: Attend aftercare/continuing care group Placement in alternative living arrangements  PATIENT/FAMILY INVOLVEMENT: This treatment plan has been presented to and reviewed with the patient, Dalton [name]. The patient and family have been given the opportunity to ask questions and make suggestions.  Elenore Rota, RN 11/22/2015, 2:49 AM

## 2015-11-23 LAB — GLUCOSE, CAPILLARY: Glucose-Capillary: 112 mg/dL — ABNORMAL HIGH (ref 65–99)

## 2015-11-23 NOTE — Progress Notes (Signed)
[facility] MD Progress Note  11/23/2015 3:58 PM Dalton [name]  MRN:  [id number] Subjective:   Patient seen, chart reviewed and case discussed with nursing staff.   Patient states that he feels better after coming here. He continues to have pain and would like to be prescribed on Xanax for his anxiety. He denies any SI here, but states it might come back again if he were to be in a same situation again. He is concerned about his son who uses drug. He denies  craving for cocaine and is interested in residential treatment. He denies AH/VH.   Per [place] controlled substance 10/24/2015 OXYCODONE- ACETAMINOPHEN 10- 325, 90 tabs, for 30 days 10/22/2015  HYDROCODON- ACETAMINOPHEN 5- 325 10 tabs for 2 days 10/22/2015 ALPRAZOLAM 2 MG TABLET 120 tab for 30 days     Principal Problem: Psychoactive substance-induced mood disorder ([place]) Diagnosis:   Patient Active Problem List   Diagnosis Date Noted  . Psychoactive substance-induced mood disorder ([place]) [id number], F06.30] 11/22/2015  . Chest pain [R07.9] 11/12/2015  . Type 2 diabetes mellitus with vascular disease ([place]) [E11.59] 11/12/2015  . History of seizure [Z86.69] 11/12/2015  . Cocaine abuse [F14.10] 01/26/2015  . Cocaine-induced mood disorder ([place]) [F14.94] 01/26/2015  . Helicobacter pylori gastritis [K29.70, B96.81] 05/30/2012  . Pure hypercholesterolemia [E78.00] 08/31/2011  . Essential hypertension, benign [I10] 08/31/2011  . 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 PAD (peripheral artery disease) ([place]) [I73.9] 08/31/2011  . GERD (gastroesophageal reflux disease) [K21.9] 12/07/2010  . Esophageal dysphagia [R13.10] 12/07/2010  . Constipation [K59.00] 12/07/2010  . Laryngopharyngeal reflux (LPR) [K21.9] 12/07/2010  . Lumbar pain with radiation down left leg [M54.5] 11/17/2010  . CARPAL TUNNEL SYNDROME [G56.00] 07/14/2009  . SHOULDER IMPINGEMENT SYNDROME, LEFT [M75.80] 05/05/2009   Total Time spent with patient: 20  minutes  Past Psychiatric History: see HPI  Past Medical History:  Past Medical History:  Diagnosis Date  . Anemia   . Arthritis   . Bronchitis, chronic ([place])   . CHF (congestive heart failure) ([place])   . Chronic back pain    Pain Clinic in [place]  . Chronic bronchitis   . Congestive heart failure (CHF) ([place])   . Coronary artery disease   . Depression   . Diabetes mellitus without complication ([place])    TYPE 2  . Frequency of urination   . GERD (gastroesophageal reflux disease)   . Headache(784.0)   . High cholesterol   . Hypertension   . Laceration of right hand 11/27/10  . Laceration of wrist 2007 BIL FOREARMS  . MI (myocardial infarction)    7, last one was in 2011  . PUD (peptic ulcer disease)    in 1990s, secondary to medication  . Seizures ([place])    entire life, last seizure in 2011;unknown etiology-pt sts heriditary  . Shortness of breath    with exertion  . Tonsillitis, chronic    Dr. Vicki Mallet in [place]    Past Surgical History:  Procedure Laterality Date  . BACK SURGERY     3-  . BIOPSY N/A 05/30/2012   Procedure: BIOPSY;  Surgeon: Danie Binder, MD;  Location: [facility];  Service: Endoscopy;  Laterality: N/A;  . CARPAL TUNNEL RELEASE     bilateral  . COLONOSCOPY  12/28/10   SLF: (MAC)Internal hemorrhoids/four small colon polyps  . CORONARY ARTERY BYPASS GRAFT  2002   3 vessels  . ESOPHAGOGASTRODUODENOSCOPY N/A 05/30/2012   SLF: UNCONTROLLED GERD DUE TO LIFESTYLE CHOICE/WEIGHT GAIN/MILD Non-erosive gastritis  .  KNEE SURGERY     left-plate to left knee cap from accident  . LEFT HEART CATHETERIZATION WITH CORONARY ANGIOGRAM N/A 08/31/2011   Procedure: LEFT HEART CATHETERIZATION WITH CORONARY ANGIOGRAM;  Surgeon: Laverda Page, MD;  Location: [facility] CATH LAB;  Service: Cardiovascular;  Laterality: N/A;  . SAVORY DILATION  12/28/2010   SLF:(MAC)J-shaped stomach/nodular mocosa in the distal esophagus/empiric dilation 79mm   Family History:  Family History   Problem Relation Age of Onset  . Diabetes Mother   . Hypertension Mother   . Heart attack Mother   . Hypertension Father   . Diabetes Father   . Heart attack Father   . Heart attack      mother, father, brother, sister all deceased due to MI  . Heart attack Sister   . Heart attack Brother   . Seizures Brother   . Heart failure Other   . Colon cancer Neg Hx   . Liver disease Neg Hx   . Anesthesia problems Neg Hx   . Hypotension Neg Hx   . Malignant hyperthermia Neg Hx   . Pseudochol deficiency Neg Hx   . Colon polyps Neg Hx    Family Psychiatric  History: see HPI Social History:  History  Alcohol Use No    Comment: Occasions.     History  Drug Use  . Frequency: 1.0 time per week  . Types: "Crack" cocaine, Cocaine    Comment: history of cocaine, etoh, marijuana but denies any the last several years-12 yrs ago    Social History   Social History  . Marital status: Single    Spouse name: N/A  . Number of children: 2  . Years of education: N/A   Occupational History  . disabled Not Employed   Social History Main Topics  . Smoking status: Never Smoker  . Smokeless tobacco: Never Used     Comment: Never Smoked  . Alcohol use No     Comment: Occasions.  . Drug use:     Frequency: 1.0 time per week    Types: "Crack" cocaine, Cocaine     Comment: history of cocaine, etoh, marijuana but denies any the last several years-12 yrs ago  . Sexual activity: No   Other Topics Concern  . None   Social History Narrative   Lives w/ son-23/22   Additional Social History:    Pain Medications: See PTA Prescriptions: See PTA Over the Counter: denies History of alcohol / drug use?: Yes Longest period of sobriety (when/how long): about 10-15 years Negative Consequences of Use: Personal relationships, Financial Name of Substance 1: Cocaine 1 - Age of First Use: 36 1 - Amount (size/oz): Varied 1 - Frequency: Weekly 1 - Duration: Ongoing 1 - Last Use / Amount: 11/20/15                   Sleep: Poor  Appetite:  Fair  Current Medications: Current Facility-Administered Medications  Medication Dose Route Frequency Provider Last Rate Last Dose  . acetaminophen (TYLENOL) tablet 650 mg  650 mg Oral Q6H PRN Laverle Hobby, PA-C   650 mg at 11/23/15 [id number]  . albuterol (PROVENTIL HFA;VENTOLIN HFA) 108 (90 Base) MCG/ACT inhaler 2 puff  2 puff Inhalation Q6H PRN Jenne Campus, MD      . alum & mag hydroxide-simeth (MAALOX/MYLANTA) 200-200-20 MG/5ML suspension 30 mL  30 mL Oral Q4H PRN Laverle Hobby, PA-C      . atorvastatin (LIPITOR) tablet 10 mg  10  mg Oral q morning - 10a Laverle Hobby, PA-C   10 mg at 11/23/15 1143  . clopidogrel (PLAVIX) tablet 75 mg  75 mg Oral QAC breakfast Laverle Hobby, PA-C   75 mg at 11/23/15 [id number]  . fluticasone (FLONASE) 50 MCG/ACT nasal spray 2 spray  2 spray Each Nare Daily PRN Laverle Hobby, PA-C      . fluticasone (FLOVENT HFA) 44 MCG/ACT inhaler 2 puff  2 puff Inhalation BID Jenne Campus, MD   2 puff at 11/23/15 0903  . hydrochlorothiazide (HYDRODIURIL) tablet 25 mg  25 mg Oral Daily Laverle Hobby, PA-C   25 mg at 11/23/15 0853  . hydrOXYzine (ATARAX/VISTARIL) tablet 25 mg  25 mg Oral Q6H PRN Laverle Hobby, PA-C      . ipratropium (ATROVENT) nebulizer solution 0.5 mg  0.5 mg Nebulization Q6H PRN Laverle Hobby, PA-C   0.5 mg at 11/22/15 2132  . levETIRAcetam (KEPPRA) tablet 500 mg  500 mg Oral Q12H Laverle Hobby, PA-C   500 mg at 11/23/15 0853  . lidocaine (LIDODERM) 5 % 1 patch  1 patch Transdermal Daily Norman Clay, MD   1 patch at 11/23/15 0903  . lisinopril (PRINIVIL,ZESTRIL) tablet 2.5 mg  2.5 mg Oral Daily Laverle Hobby, PA-C   2.5 mg at 11/23/15 0853  . loratadine (CLARITIN) tablet 10 mg  10 mg Oral Daily Laverle Hobby, PA-C   10 mg at 11/23/15 0853  . magnesium hydroxide (MILK OF MAGNESIA) suspension 30 mL  30 mL Oral Daily PRN Laverle Hobby, PA-C      . metFORMIN (GLUCOPHAGE) tablet 500 mg  500  mg Oral BID WC Laverle Hobby, PA-C   500 mg at 11/23/15 0855  . metoprolol tartrate (LOPRESSOR) tablet 12.5 mg  12.5 mg Oral BID Laverle Hobby, PA-C   12.5 mg at 11/23/15 0858  . pantoprazole (PROTONIX) EC tablet 40 mg  40 mg Oral Daily Laverle Hobby, PA-C   40 mg at 11/23/15 0858  . potassium chloride SA (K-DUR,KLOR-CON) CR tablet 20 mEq  20 mEq Oral Daily Laverle Hobby, PA-C   20 mEq at 11/23/15 0856  . pregabalin (LYRICA) capsule 100 mg  100 mg Oral TID Laverle Hobby, PA-C   100 mg at 11/23/15 0901  . sertraline (ZOLOFT) tablet 150 mg  150 mg Oral Daily Norman Clay, MD   150 mg at 11/23/15 0857  . tamsulosin (FLOMAX) capsule 0.4 mg  0.4 mg Oral QPC breakfast Laverle Hobby, PA-C   0.4 mg at 11/23/15 0858  . traZODone (DESYREL) tablet 50 mg  50 mg Oral QHS,MR X 1 Laverle Hobby, PA-C   50 mg at 11/22/15 2222    Lab Results:  Results for orders placed or performed during the hospital encounter of 11/22/15 (from the past 48 hour(s))  Glucose, capillary     Status: Abnormal   Collection Time: 11/23/15  6:15 AM  Result Value Ref Range   Glucose-Capillary 112 (H) 65 - 99 mg/dL    Blood Alcohol level:  Lab Results  Component Value Date   ETH <5 11/21/2015   ETH <5 [id number]    Metabolic Disorder Labs: Lab Results  Component Value Date   HGBA1C 5.3 11/14/2015   MPG 105 11/14/2015   MPG 123 10/24/2008   No results found for: PROLACTIN Lab Results  Component Value Date   CHOL  04/17/2010    185  ATP III CLASSIFICATION:  <200     mg/dL   Desirable  200-239  mg/dL   Borderline High  >=240    mg/dL   High          TRIG 75 04/17/2010   HDL 44 04/17/2010   CHOLHDL 4.2 04/17/2010   VLDL 15 04/17/2010   LDLCALC (H) 04/17/2010    126        Total Cholesterol/HDL:CHD Risk Coronary Heart Disease Risk Table                     Men   Women  1/2 Average Risk   3.4   3.3  Average Risk       5.0   4.4  2 X Average Risk   9.6   7.1  3 X Average Risk  23.4   11.0         Use the calculated Patient Ratio above and the CHD Risk Table to determine the patient's CHD Risk.        ATP III CLASSIFICATION (LDL):  <100     mg/dL   Optimal  100-129  mg/dL   Near or Above                    Optimal  130-159  mg/dL   Borderline  160-189  mg/dL   High  >190     mg/dL   Very High   LDLCALC (H) 10/24/2008    116        Total Cholesterol/HDL:CHD Risk Coronary Heart Disease Risk Table                     Men   Women  1/2 Average Risk   3.4   3.3  Average Risk       5.0   4.4  2 X Average Risk   9.6   7.1  3 X Average Risk  23.4   11.0        Use the calculated Patient Ratio above and the CHD Risk Table to determine the patient's CHD Risk.        ATP III CLASSIFICATION (LDL):  <100     mg/dL   Optimal  100-129  mg/dL   Near or Above                    Optimal  130-159  mg/dL   Borderline  160-189  mg/dL   High  >190     mg/dL   Very High    Physical Findings: AIMS: Facial and Oral Movements Muscles of Facial Expression: None, normal Lips and Perioral Area: None, normal Jaw: None, normal Tongue: None, normal,Extremity Movements Upper (arms, wrists, hands, fingers): None, normal Lower (legs, knees, ankles, toes): None, normal, Trunk Movements Neck, shoulders, hips: None, normal, Overall Severity Severity of abnormal movements (highest score from questions above): None, normal Incapacitation due to abnormal movements: None, normal Patient's awareness of abnormal movements (rate only patient's report): No Awareness, Dental Status Current problems with teeth and/or dentures?: No Does patient usually wear dentures?: No  CIWA:    COWS:     Musculoskeletal: Strength & Muscle Tone: within normal limits Gait & Station: normal Patient leans: N/A  Psychiatric Specialty Exam: Physical Exam  Review of Systems  Gastrointestinal: Negative for nausea and vomiting.  Musculoskeletal: Positive for back pain.  Neurological: Negative for tremors.   Psychiatric/Behavioral: Positive for depression and substance abuse. Negative for  hallucinations and suicidal ideas. The patient is nervous/anxious and has insomnia.   All other systems reviewed and are negative.   Blood pressure 115/69, pulse 69, temperature 97.9 F (36.6 C), temperature source Oral, resp. rate 16, height 5\' 10"  (1.778 m), weight 228 lb 2.8 oz (103.5 kg), SpO2 100 %.Body mass index is 32.74 kg/m.  General Appearance: Disheveled  Eye Contact:  Fair  Speech:  Clear and Coherent  Volume:  Normal  Mood:  Depressed  Affect:  Depressed  Thought Process:  Coherent  Orientation:  Full (Time, Place, and Person)  Thought Content:  Logical  Suicidal Thoughts:  No  Homicidal Thoughts:  No  Memory:  Immediate;   Fair Recent;   Fair Remote;   Fair  Judgement:  Fair  Insight:  Present  Psychomotor Activity:  Normal  Concentration:  Concentration: Fair and Attention Span: Fair  Recall:  [place] of Knowledge:  Good  Language:  Good  Akathisia:  No  Handed:  Right  AIMS (if indicated):     Assets:  Communication Skills Desire for Improvement  ADL's:  Intact  Cognition:  WNL  Sleep:  Number of Hours: 5.5   Assessment 55 yo M with cocaine use disorder, depression, CAD, hypertension, DM, chronic back pain, seizure per report, who was transferred to [facility] for SI  after admission for medical evaluation of chest pain in the context of cocaine use. Psychosocial stressors including his physical condition of cardiac disease.   # Substance induced mood disorder # r/o MDD There has been some improvement in his neurovegetative symptoms since admission. Will continue sertraline.   # Cocaine use disorder Patient lacks insight into his use, although he is motivated for sobriety and is interested in residential treatment.  Will continue motivational interview.   Plan - Continue sertraline 150 mg daily - Continue Trazodone 50 mg qhsprn for insomnia - Continue hydroxyzine prn for  anxiety - Continue management for his medical condition including Keppra 500 mg BID - lidocaine patch for his back pain - Admit for crisis management and stabilization. - Medication management to reduce current symptoms to base line and improve the patient's overall level of functioning. - Monitor for the adverse effect of the medications and anger outbursts - Continue 15 minutes observation for safety concerns - Encouraged to participate in milieu therapy and group therapy counseling sessions and also work with coping skills -  Develop treatment plan to decrease risk of relapse upon discharge and to reduce the need for readmission. -  Psycho-social education regarding relapse prevention and self care. - Health care follow up as needed for medical problems. - Restart home medications where appropriate.  Treatment Plan Summary: Daily contact with patient to assess and evaluate symptoms and progress in treatment  Norman Clay, MD 11/23/2015, 3:58 PM

## 2015-11-23 NOTE — [facility] Group Notes (Signed)
Adult Therapy Group Note  Date: 11/23/2015 Time:  10:00-11:00AM  Group Topic/Focus: Healthy Air cabin crew Esteem:   The focus of this group was to assist patients in identifying current healthy supports, as well as how to widen their support systems.  Examples given by various patients were used to emphasize the importance of expanding supports, with an emphasis on the use of AA/NA, problem-specific support groups, doctors, counselors, and self.  Most patients also chose to share the reasons for their current hospitalization, and received much encouragement and support from their fellow patients as they did this.   Participation Level:  Active  Participation Quality:  Attentive and Sharing  Affect:  Blunted and Depressed  Cognitive:  Appropriate  Insight: Good  Engagement in Group:  Engaged  Modes of Intervention:  Discussion, Exploration and Support  Additional Comments:  The patient expressed that healthy supports currently active include his two sons, although he had just shared that he had relapsed on crack cocaine because one of his sons sells it and had left some behind.  He stated that his sons do not understand that he cannot be around drugs.  They smoke marijuana, and he sees that as "the same thing" and cannot be around it.  He stated that his 1yo grandson is a big encouragement to him to be sober.  He did share that he is in the hospital for a crack cocaine relapse, and said he has had 7 heart attacks with triple bypass surgery and 3 back surgeries.   Maretta Los, LCSW 11/23/2015  4:15PM

## 2015-11-23 NOTE — Progress Notes (Signed)
Patient did attend the evening speaker AA meeting.  

## 2015-11-23 NOTE — Progress Notes (Signed)
D) Pt. Pleasant and cooperative. Reported throbbing pain in lower legs.  Pt. Reports regrets that he is missing time with his grandchildren.  Pt. Reports feeling motivated for treatment.  A) Support offered.  Comfort measures offered. R) pt. Receptive and continues to contract for safety.

## 2015-11-23 NOTE — Progress Notes (Signed)
Patient ID: KOEHN HARROW, male   DOB: 1960-12-04, 55 y.o.   MRN: [id number]    Pt currently presents with a blunted affect and anxious behavior. Pt reports to Probation officer that their goal is to "work on getting back to my grandchildren." Pt states "I would like to let my sons know that I am here and getting treatment, I don't think they will answer if I call." Pt reports good sleep with current medication regimen.   Pt provided with medications per providers orders. Pt's labs and vitals were monitored throughout the night. Pt supported emotionally and encouraged to express concerns and questions. Pt educated on medications.   Pt's safety ensured with 15 minute and environmental checks. Pt currently endorses passive SI related to guilt over not being there for his grandchildren. No specific plan reported. Pt currently denies HI and A/V hallucinations. Pt verbally agrees to seek staff if HI or A/VH occurs and to consult with staff before acting on any harmful thoughts. Attempted to reach patients sons with signed permission, no answer. Will pass on number to oncoming nurse.  Will continue POC.

## 2015-11-24 LAB — GLUCOSE, CAPILLARY
Glucose-Capillary: 89 mg/dL (ref 65–99)
Glucose-Capillary: 110 mg/dL — ABNORMAL HIGH (ref 65–99)

## 2015-11-24 MED ORDER — HYDROXYZINE HCL 25 MG PO TABS
25.0000 mg | ORAL_TABLET | Freq: Every evening | ORAL | Status: DC | PRN
  Administered 2015-11-24 – 2015-11-26 (×2): 25 mg via ORAL
  Filled 2015-11-24 (×2): qty 1

## 2015-11-24 NOTE — Tx Team (Signed)
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 acetaminophen (TYLENOL) tablet 650 mg  650 mg Oral Q6H PRN Laverle Hobby, PA-C   650 mg at 11/23/15 2115  . albuterol (PROVENTIL HFA;VENTOLIN HFA) 108 (90 Base) MCG/ACT inhaler 2 puff  2 puff Inhalation Q6H PRN Jenne Campus, MD      . alum & mag hydroxide-simeth (MAALOX/MYLANTA) 200-200-20 MG/5ML suspension 30 mL  30 mL Oral Q4H PRN Laverle Hobby, PA-C      . atorvastatin (LIPITOR) tablet 10 mg  10 mg Oral q morning - 10a Laverle Hobby, PA-C   10 mg at 11/23/15 1143  . clopidogrel (PLAVIX) tablet 75 mg  75 mg Oral QAC breakfast Laverle Hobby, PA-C   75 mg at 11/24/15 [id number]  . fluticasone (FLONASE) 50 MCG/ACT nasal spray 2 spray  2 spray Each Nare Daily PRN Laverle Hobby, PA-C      . fluticasone (FLOVENT HFA) 44 MCG/ACT inhaler 2 puff  2 puff Inhalation BID Jenne Campus, MD   2 puff at 11/23/15 1953  . hydrochlorothiazide (HYDRODIURIL) tablet 25 mg  25 mg Oral Daily Laverle Hobby, PA-C   25 mg at 11/23/15 0853  . hydrOXYzine (ATARAX/VISTARIL) tablet 25 mg  25 mg Oral Q6H PRN Laverle Hobby, PA-C      . ipratropium (ATROVENT) nebulizer solution 0.5 mg  0.5 mg Nebulization Q6H PRN Laverle Hobby, PA-C   0.5 mg at 11/23/15 1558  . levETIRAcetam (KEPPRA) tablet 500 mg  500 mg Oral Q12H Laverle Hobby, PA-C   500 mg at 11/23/15 1954  . lidocaine (LIDODERM) 5 % 1 patch  1 patch Transdermal Daily Norman Clay, MD   1 patch at 11/23/15 0903  . lisinopril (PRINIVIL,ZESTRIL) tablet 2.5 mg  2.5 mg Oral Daily Laverle Hobby, PA-C   2.5 mg at 11/23/15 0853  .  loratadine (CLARITIN) tablet 10 mg  10 mg Oral Daily Laverle Hobby, PA-C   10 mg at 11/23/15 0853  . magnesium hydroxide (MILK OF MAGNESIA) suspension 30 mL  30 mL Oral Daily PRN Laverle Hobby, PA-C      . metFORMIN (GLUCOPHAGE) tablet 500 mg  500 mg Oral BID WC Laverle Hobby, PA-C   500 mg at 11/23/15 1705  . metoprolol tartrate (LOPRESSOR) tablet 12.5 mg  12.5 mg Oral BID Laverle Hobby, PA-C   12.5 mg at 11/23/15 1703  . pantoprazole (PROTONIX) EC tablet 40 mg  40 mg Oral Daily Laverle Hobby, PA-C   40 mg at 11/23/15 0858  . potassium chloride SA (K-DUR,KLOR-CON) CR tablet 20 mEq  20 mEq Oral Daily Laverle Hobby, PA-C   20 mEq at 11/23/15 0856  . pregabalin (LYRICA) capsule 100 mg  100 mg Oral TID Laverle Hobby, PA-C   100 mg at 11/23/15 1955  . sertraline (ZOLOFT) tablet 150 mg  150 mg Oral Daily Norman Clay, MD   150 mg at 11/23/15 0857  . tamsulosin (FLOMAX) capsule 0.4 mg  0.4 mg Oral QPC breakfast Laverle Hobby, PA-C   0.4 mg at 11/23/15 0858  . traZODone (DESYREL) tablet 50 mg  50 mg Oral QHS,MR X 1  Laverle Hobby, PA-C   50 mg at 11/23/15 2245   PTA Medications: Prescriptions Prior to Admission  Medication Sig Dispense Refill Last Dose  . albuterol (PROAIR HFA) 108 (90 BASE) MCG/ACT inhaler Inhale 2 puffs into the lungs every 6 (six) hours as needed for wheezing (for shortness of breath).   Past Week at Unknown time  . albuterol (PROVENTIL) (2.5 MG/3ML) 0.083% nebulizer solution Take 3 mLs (2.5 mg total) by nebulization 2 (two) times daily. (Patient taking differently: Take 2.5 mg by nebulization 3 (three) times daily. ) 75 mL  11/21/2015 at am  . alprazolam (XANAX) 2 MG tablet Take 2 mg by mouth 2 (two) times daily.   11/21/2015 at am  . aspirin EC 81 MG tablet Take 1 tablet (81 mg total) by mouth daily. 30 tablet 0 11/21/2015 at am  . atorvastatin (LIPITOR) 10 MG tablet Take 1 tablet (10 mg total) by mouth every morning.   11/21/2015 at am  . beclomethasone (QVAR) 80  MCG/ACT inhaler Inhale 2 puffs into the lungs 2 (two) times daily. (Patient taking differently: Inhale 2 puffs into the lungs 2 (two) times daily as needed. ) 1 Inhaler 12 Past Week at Unknown time  . cetirizine (ZYRTEC) 10 MG tablet Take 10 mg by mouth daily as needed for allergies or rhinitis. Reported on 04/16/2015   [facility] at [facility]  . clopidogrel (PLAVIX) 75 MG tablet Take 75 mg by mouth daily before breakfast.   11/21/2015 at am  . diclofenac sodium (VOLTAREN) 1 % GEL Place 1 application onto the skin 4 (four) times daily as needed.   Past Month at Unknown time  . doxycycline (VIBRA-TABS) 100 MG tablet Take 1 tablet (100 mg total) by mouth every 12 (twelve) hours. 20 tablet 0 11/21/2015 at Unknown time  . esomeprazole (NEXIUM) 40 MG capsule Take 1 capsule (40 mg total) by mouth 2 (two) times daily. (Patient not taking: Reported on 11/21/2015) 1 capsule 0 Not Taking at Unknown time  . fluticasone (FLONASE) 50 MCG/ACT nasal spray Place 2 sprays into both nostrils daily as needed for allergies.    Past Week at Unknown time  . furosemide (LASIX) 40 MG tablet Take 1 tablet (40 mg total) by mouth daily as needed for fluid or edema. 30 tablet 0 Past Week at Unknown time  . hydrochlorothiazide (HYDRODIURIL) 25 MG tablet Take 25 mg by mouth daily.  0 11/21/2015 at am  . hydrOXYzine (ATARAX/VISTARIL) 25 MG tablet Take 1 tablet (25 mg total) by mouth every 6 (six) hours. 12 tablet 0 Past Week at Unknown time  . ipratropium (ATROVENT) 0.02 % nebulizer solution Take 0.5 mg by nebulization every 6 (six) hours as needed for wheezing or shortness of breath.   Past Week at Unknown time  . lamoTRIgine (LAMICTAL) 200 MG tablet Take 1 tablet (200 mg total) by mouth daily. 30 tablet 0 11/21/2015 at am  . levETIRAcetam (KEPPRA) 500 MG tablet Take 1 tablet (500 mg total) by mouth every 12 (twelve) hours.   11/21/2015 at am  . lisinopril (ZESTRIL) 2.5 MG tablet Take 1 tablet (2.5 mg total) by mouth daily. 30 tablet 0 11/21/2015 at am   . metFORMIN (GLUCOPHAGE) 1000 MG tablet Take 0.5 tablets (500 mg total) by mouth daily with breakfast. (Patient taking differently: Take 500 mg by mouth 2 (two) times daily with a meal. )   11/21/2015 at am  . metoprolol tartrate (LOPRESSOR) 25 MG tablet Take 0.5 tablets (12.5 mg total) by mouth 2 (  two) times daily.   11/21/2015 at 1400  . omeprazole (PRILOSEC) 20 MG capsule Take 20 mg by mouth 2 (two) times daily.  0 11/21/2015 at am  . oxyCODONE-acetaminophen (PERCOCET) 10-325 MG tablet Take 1 tablet by mouth every 6 (six) hours as needed for pain.   11/12/2015 at am  . potassium chloride SA (K-DUR,KLOR-CON) 20 MEQ tablet Take 1 tablet (20 mEq total) by mouth daily. 30 tablet 0 11/21/2015 at am  . pregabalin (LYRICA) 100 MG capsule Take 100-200 mg by mouth 3 (three) times daily as needed (for shooting pains in feet).    11/21/2015 at am  . sertraline (ZOLOFT) 100 MG tablet Take 1 tablet (100 mg total) by mouth daily. 1 tablet 0 11/21/2015 at am  . tamsulosin (FLOMAX) 0.4 MG CAPS capsule Take 1 capsule (0.4 mg total) by mouth daily after breakfast. 30 capsule  11/21/2015 at Unknown time    Patient Stressors: Financial difficulties Loss of support; moving locations recently Substance abuse  Patient Strengths: Ability for insight Average or above average intelligence Communication skills General fund of knowledge  Treatment Modalities: Medication Management, Group therapy, Case management,  1 to 1 session with clinician, Psychoeducation, Recreational therapy.   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  Therapeutic Interventions: 1 to 1 sessions, Unit Group sessions and Medication administration.  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  Therapeutic Interventions: 1 to 1 sessions, Unit Group sessions and Medication administration.  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 RN will report any adverse and/or side effects to prescribing provider.  Therapeutic Interventions: 1 on 1 counseling sessions, Psychoeducation, Medication  administration, Evaluate responses to treatment, Monitor vital signs and CBGs as ordered, Perform/monitor CIWA, COWS, AIMS and Fall Risk screenings as ordered, Perform wound care treatments as ordered.  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  Short Term Goals: Engage patient in aftercare planning with referrals and resources, Increase social support, Increase emotional regulation, Identify triggers associated with mental health/substance abuse issues and Increase skills for wellness and recovery  Therapeutic Interventions: Assess for all discharge needs, 1 to 1 time with Social worker, Explore available resources and support systems, Assess for adequacy in community support network, Educate family and significant other(s) on suicide prevention, Complete Psychosocial Assessment, Interpersonal group therapy.  Evaluation of Outcomes: Progressing   Progress in Treatment :  Attending groups: Intermittently  Participating in groups: Yes, when he attends  Taking medication as prescribed: Yes, MD continuing to assess for appropriate medication regimen  Toleration medication: Yes  Family/Significant other contact made: Treatment team assessing for appropriate contacts  Patient understands diagnosis: Yes  Discussing patient identified problems/goals with staff: Yes  Medical problems stabilized or resolved: Yes  Denies suicidal/homicidal ideation: Treatment team continuing to asses  Issues/concerns per patient self-inventory: None reported  Other: N/A  New problem(s) identified: None reported at this time    New Short Term/Long Term Goal(s): None at this time    Discharge Plan or Barriers: Patient is homeless and denies any social supports.  He is interested in residential treatment.     Reason for Continuation of Hospitalization: Anxiety Depression Medication stabilization Suicidal Ideations Withdrawal symptoms  Estimated Length of Stay: 2-3 days    Attendees:  Patient:  Physician: Dr. Sharolyn Douglas, MD 10/2/20179:30am  Nursing: Marnee Guarneri, Loletta Specter, RN10/2/20179:30am  RN Care Manager: Lars Pinks, CM 10/2/20179:30am  Social Workers: Tilden Fossa, LCSW 10/2/20179:30am  Nurse Pratictioners: Samuel Jester, NP, Ricky Ala, NP 10/2/20179:30am  Other:10/2/20179:30am   Scribe for Treatment Team: Tilden Fossa, LCSW Clinical Social Worker [place]

## 2015-11-24 NOTE — Progress Notes (Signed)
[facility] MD Progress Note  11/24/2015 12:12 PM  Patient Active Problem List   Diagnosis Date Noted  . 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 Chest pain 11/12/2015  . Type 2 diabetes mellitus with vascular disease ([place]) 11/12/2015  . History of seizure 11/12/2015  . Cocaine abuse 01/26/2015  . Cocaine-induced mood disorder ([place]) 01/26/2015  . Helicobacter pylori gastritis 05/30/2012  . Pure hypercholesterolemia 08/31/2011  . Essential hypertension, benign 08/31/2011  . Postsurgical aortocoronary bypass status 08/31/2011  . PAD (peripheral artery disease) ([place]) 08/31/2011  . GERD (gastroesophageal reflux disease) 12/07/2010  . Esophageal dysphagia 12/07/2010  . Constipation 12/07/2010  . Laryngopharyngeal reflux (LPR) 12/07/2010  . Lumbar pain with radiation down left leg 11/17/2010  . CARPAL TUNNEL SYNDROME 07/14/2009  . SHOULDER IMPINGEMENT SYNDROME, LEFT 05/05/2009    Diagnosis: Cocaine use disorder severe  Subjective: Dalton [name] reports that "things was great," for the past year or so when he was living with a son in [place]. He reports he recently moved to [facility] and that one of his relatives sells drugs and he ended up relapsing on cocaine. He reports prior to that he did have about a 10 year period of sobriety. He reports that there is recent conflict with his family and it appears that they are not currently contacting him. He reports a medical history of "7 heart attacks" and "3 back surgeries" he states he has had a sleep study but does not what know what the results are. He also has seizure disorder's diabetes GERD and increased cholesterol.  Mr. Haizlip has a number of superficial scars on the left inside of his arm and some on the right and he reports that he use to cut. But has not done so for 10 or 15 years and has no desire to cut now. He denies acute suicidal ideation, plan or intent. He denies psychotic symptoms. He denies any homicidal ideation,  plan or intent. He reports that he would be interested in possibly some treatment for his cocaine use disorder.  Objective: Well-developed well-nourished man in no apparent distress pleasant and appropriate grooming is good, with good eye contact. Speech and motor within normal limits. He does have scarring noted on the insides of both arms left greater than right. Thought processes are linear and goal directed thought content denies current acute suicidal or homicidal ideation, plan or intent to current psychotic symptoms. Alert and oriented 3. IQ appears within average range insight and judgment are fair.   Current Facility-Administered Medications (Endocrine & Metabolic):  .  metFORMIN (GLUCOPHAGE) tablet 500 mg   Current Facility-Administered Medications (Cardiovascular):  .  atorvastatin (LIPITOR) tablet 10 mg .  hydrochlorothiazide (HYDRODIURIL) tablet 25 mg .  lisinopril (PRINIVIL,ZESTRIL) tablet 2.5 mg .  metoprolol tartrate (LOPRESSOR) tablet 12.5 mg   Current Facility-Administered Medications (Respiratory):  .  albuterol (PROVENTIL HFA;VENTOLIN HFA) 108 (90 Base) MCG/ACT inhaler 2 puff .  fluticasone (FLONASE) 50 MCG/ACT nasal spray 2 spray .  fluticasone (FLOVENT HFA) 44 MCG/ACT inhaler 2 puff .  ipratropium (ATROVENT) nebulizer solution 0.5 mg .  loratadine (CLARITIN) tablet 10 mg   Current Facility-Administered Medications (Analgesics):  .  acetaminophen (TYLENOL) tablet 650 mg   Current Facility-Administered Medications (Hematological):  .  clopidogrel (PLAVIX) tablet 75 mg   Current Facility-Administered Medications (Other):  .  alum & mag hydroxide-simeth (MAALOX/MYLANTA) 200-200-20 MG/5ML suspension 30 mL .  hydrOXYzine (ATARAX/VISTARIL) tablet 25 mg .  levETIRAcetam (KEPPRA) tablet 500 mg .  lidocaine (LIDODERM) 5 %  1 patch .  magnesium hydroxide (MILK OF MAGNESIA) suspension 30 mL .  pantoprazole (PROTONIX) EC tablet 40 mg .  potassium chloride SA  (K-DUR,KLOR-CON) CR tablet 20 mEq .  pregabalin (LYRICA) capsule 100 mg .  sertraline (ZOLOFT) tablet 150 mg .  tamsulosin (FLOMAX) capsule 0.4 mg .  traZODone (DESYREL) tablet 50 mg  No current outpatient prescriptions on file.  Vital Signs:Blood pressure 112/65, pulse 71, temperature 97.9 F (36.6 C), temperature source Oral, resp. rate 20, height 5\' 10"  (1.778 m), weight 103.5 kg (228 lb 2.8 oz), SpO2 100 %.    Lab Results:  Results for orders placed or performed during the hospital encounter of 11/22/15 (from the past 48 hour(s))  Glucose, capillary     Status: Abnormal   Collection Time: 11/23/15  6:15 AM  Result Value Ref Range   Glucose-Capillary 112 (H) 65 - 99 mg/dL  Glucose, capillary     Status: Abnormal   Collection Time: 11/24/15  6:15 AM  Result Value Ref Range   Glucose-Capillary 110 (H) 65 - 99 mg/dL    Physical Findings: AIMS: Facial and Oral Movements Muscles of Facial Expression: None, normal Lips and Perioral Area: None, normal Jaw: None, normal Tongue: None, normal,Extremity Movements Upper (arms, wrists, hands, fingers): None, normal Lower (legs, knees, ankles, toes): None, normal, Trunk Movements Neck, shoulders, hips: None, normal, Overall Severity Severity of abnormal movements (highest score from questions above): None, normal Incapacitation due to abnormal movements: None, normal Patient's awareness of abnormal movements (rate only patient's report): No Awareness, Dental Status Current problems with teeth and/or dentures?: No Does patient usually wear dentures?: No  CIWA:    COWS:      Assessment/Plan: Mr. Fechter was recently admitted for substance use disorder and suicidal ideation secondary to relapsing and family conflict. We will continue to monitor him for withdrawal and safety. For now we will continue the Zoloft that he is currently prescribed and continue to monitor response. Social worker will work with Mr. Spiegler to identify  resources to address his substance abuse once released.  Linard Millers, MD 11/24/2015, 12:12 PM

## 2015-11-24 NOTE — Progress Notes (Signed)
The patient attended this evening's A.A.meeting and was appropriate.  

## 2015-11-24 NOTE — Progress Notes (Signed)
Recreation Therapy Notes  Date: 11/24/15  Time: 0930 Location: [address] Dayroom  Group Topic: Stress Management  Goal Area(s) Addresses:  Patient will verbalize importance of using healthy stress management.  Patient will identify positive emotions associated with healthy stress management.   Behavioral Response: Engaged  Intervention: Guided Imagery  Activity :  Engineering geologist.  LRT introduced to the technique of guided imagery to patients.  LRT read a script so patients could participate in the technique.  Patients were to follow along as LRT read script.  Education:  Stress Management, Discharge Planning.   Education Outcome: Acknowledges edcuation/In group clarification offered/Needs additional education  Clinical Observations/Feedback: Pt attended group.  Pt stated his peaceful place was being with his grandchildren.   Victorino Sparrow, LRT/CTRS    Victorino Sparrow A 11/24/2015 11:38 AM

## 2015-11-24 NOTE — Progress Notes (Signed)
Nursing Note: 0700-1900  D:  Pt presents with depressed mood, "I was doing good until the devil took over."  Rates depression 5/10, hopelessness 5/10 and anxiety 5/10.  States that pain is 10/10 on inventory- placed lidoderm patch on lower back as ordered.  "Trying to work on me," "Help me, I need my family." He is interactive with others in milieu.  A: Sat 1:1 with pt couple times throughout shift,  encouraged to verbalize needs and concerns, active listening and support provided.  Continued Q 15 minute safety checks.    R:  Pt. is cooperative and pleasant, denies A/V hallucinations and is able to verbally contract for safety.

## 2015-11-25 LAB — GLUCOSE, CAPILLARY: GLUCOSE-CAPILLARY: 90 mg/dL (ref 65–99)

## 2015-11-25 NOTE — Plan of Care (Signed)
Problem: Safety: Goal: Ability to remain free from injury will improve Outcome: Progressing Pt is safe and free from injury this shift

## 2015-11-25 NOTE — Progress Notes (Signed)
D:  Patient visible in dayroom; interacts appropriately with staff and peers; denies suicidal and homicidal ideation and AVH; no self-injurious behaviors noted or reported A:  Medications given as ordered; emotional support provided; encouraged him to seek assistance with needs/concerns. R:  Remains on q 15 minute safety checks; safety maintained on unit

## 2015-11-25 NOTE — Progress Notes (Signed)
Recreation Therapy Notes  Animal-Assisted Activity (AAA) Program Checklist/Progress Notes Patient Eligibility Criteria Checklist & Daily Group note for Rec TxIntervention  Date: 10.03.2017 Time: 2:45pm Location: [address]    AAA/T Program Assumption of Risk Form signed by Patient/ or Parent Legal Guardian NO. Patient refused at admission.   Behavioral Response: Did not attend.   Dalton [name] Dalton [name], LRT/CTRS  Dalton [name] Dalton [name] 11/25/2015 3:01 PM

## 2015-11-25 NOTE — Progress Notes (Signed)
Patient requesting residential treatment. CSW has contacted [facility], [facility], [facility], [facility], and [facility]- none of which can accept him due to his insurance.   Tilden Fossa, LCSW Clinical Social Worker [facility] [phone]

## 2015-11-25 NOTE — Progress Notes (Addendum)
Chaplain provided follow up support with Dalton [name].  Dalton [name] reported speaking with his son.  Stated he found this conversation productive and was able to tell son "I am hearing you" as well as voice his concern with his son's actions that perpetuate Dalton [name]'s temptation to use substances.   Dalton [name] is hopeful for rebuilding relationship with family.  Provided support around a patient from [address] being transported to [facility].

## 2015-11-25 NOTE — [facility] Group Notes (Signed)
Patient did not attend group.

## 2015-11-25 NOTE — Plan of Care (Signed)
Problem: Activity: Goal: Interest or engagement in activities will improve Outcome: Progressing Visible in dayroom  Problem: Safety: Goal: Periods of time without injury will increase Outcome: Progressing Safety maintained on unit  Problem: Medication: Goal: Compliance with prescribed medication regimen will improve Outcome: Progressing Compliant with medication regimen  Problem: Self-Concept: Goal: Ability to disclose and discuss suicidal ideas will improve Outcome: Progressing Denies suicidal ideation

## 2015-11-25 NOTE — Progress Notes (Signed)
Patient ID: KELLUM ADONA, male   DOB: December 01, 1960, 55 y.o.   MRN: [id number]  D: Patient in dayroom interacting with peers. Pt reports goal is to get into an inpatient rehab treatment because he wants to rebuild the relationships with his sons and grandchildren. Pt reports tolerating medication well. Pt denies SI/HI/AVH and pain. Cooperative with assessment.   A: Medications administered as prescribed. Support and encouragement provided. Pt encouraged to discuss feelings and come to staff with any question or concerns.   R: Patient remains safe and complaint with medications. Pt attended and participated in evening wrap up group.

## 2015-11-25 NOTE — BHH Group Notes (Signed)
Pt attended spiritual care group on grief and loss facilitated by chaplain Jerene Pitch   Group opened with brief discussion and psycho-social ed around grief and loss in relationships and in relation to self - identifying life patterns, circumstances, changes that cause losses. Established group norm of speaking from own life experience. Group goal of establishing open and affirming space for members to share loss and experience with grief, normalize grief experience and provide psycho social education and grief support.  Group drew on Narrative, Adlerian, and brief CBT modalities.   Dalton [name] was present throughout group.  Alert and oriented x4.   Dalton [name] connected with other group members around loss of relationship with his children.  Is hopeful that he is able to reconnect with his sons, whom he describes as creating boundaries due to Dalton [name]'s substance use.

## 2015-11-25 NOTE — Progress Notes (Signed)
Social worker talked with patient this afternoon and was notified that they had been unable to find residential treatment for him that accepted his insurance.  He was provided information on [facility].  He became upset and reportedly told her "I guess I'll have to go back to my first choice - death.  I went and talked with patient and he was upset that a program had not been found for him. He voiced no suicidal ideation and verbally contracted for safety.  He did, however, refuse to take his 1700 medications. Encouraged him to take his medications but he said that he did not want them.
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
## 2015-11-25 NOTE — Progress Notes (Signed)
CSW informed patient that residential treatment is unlikely as CSW has been unable to to find residential treatment program that accepts [facility] & Medicaid. Patient also unlikely to meet criteria for residential treatment as he only endorses occasional cocaine use to CSW. CSW provided patient with information on recovery houses such as [facility], [facility], and [facility] (patient not eligible at this time as he does not have an ID). Patient not engaged in discussion, appeared agitated and depressed. He states "I guess I'll have to go back to my first choice- death" and walked back into his room. CSW informed MHT and RN of conversation with patient and his statement.   Tilden Fossa, LCSW Clinical Social Worker [facility] [phone]

## 2015-11-25 NOTE — Progress Notes (Signed)
[facility] MD Progress Note  11/25/2015 1:39 PM  Patient Active Problem List   Diagnosis Date Noted  . 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 Chest pain 11/12/2015  . Type 2 diabetes mellitus with vascular disease ([place]) 11/12/2015  . History of seizure 11/12/2015  . Cocaine abuse 01/26/2015  . Cocaine-induced mood disorder ([place]) 01/26/2015  . Helicobacter pylori gastritis 05/30/2012  . Pure hypercholesterolemia 08/31/2011  . Essential hypertension, benign 08/31/2011  . Postsurgical aortocoronary bypass status 08/31/2011  . PAD (peripheral artery disease) ([place]) 08/31/2011  . GERD (gastroesophageal reflux disease) 12/07/2010  . Esophageal dysphagia 12/07/2010  . Constipation 12/07/2010  . Laryngopharyngeal reflux (LPR) 12/07/2010  . Lumbar pain with radiation down left leg 11/17/2010  . CARPAL TUNNEL SYNDROME 07/14/2009  . SHOULDER IMPINGEMENT SYNDROME, LEFT 05/05/2009    Diagnosis:Cocaine use disorder, severe   Subjective: Mr. Iannucci reports that he has had contact with his family which is encouraging. He is interested in a long-term program for treatment of his substance use disorder and he is working with social work on options. He reports that his sleep remained poor with a trial of Vistaril, but he elects to remain at the same dose for another day to see if it's more helpful. He denies any current suicidal or homicidal ideation, plan or intent.  Objective: Well developed well nourished man in no apparent distress pleasant and cooperative, speech and motor within normal limits, thought processes are linear and goal directed, thought content denies any psychosis or suicidal or homicidal ideation, plan or intent alert and oriented 3 insight and judgment are fair IQ appears an average range   Current Facility-Administered Medications (Endocrine & Metabolic):  .  metFORMIN (GLUCOPHAGE) tablet 500 mg   Current Facility-Administered Medications (Cardiovascular):   .  atorvastatin (LIPITOR) tablet 10 mg .  hydrochlorothiazide (HYDRODIURIL) tablet 25 mg .  lisinopril (PRINIVIL,ZESTRIL) tablet 2.5 mg .  metoprolol tartrate (LOPRESSOR) tablet 12.5 mg   Current Facility-Administered Medications (Respiratory):  .  albuterol (PROVENTIL HFA;VENTOLIN HFA) 108 (90 Base) MCG/ACT inhaler 2 puff .  fluticasone (FLONASE) 50 MCG/ACT nasal spray 2 spray .  fluticasone (FLOVENT HFA) 44 MCG/ACT inhaler 2 puff .  ipratropium (ATROVENT) nebulizer solution 0.5 mg .  loratadine (CLARITIN) tablet 10 mg   Current Facility-Administered Medications (Analgesics):  .  acetaminophen (TYLENOL) tablet 650 mg   Current Facility-Administered Medications (Hematological):  .  clopidogrel (PLAVIX) tablet 75 mg   Current Facility-Administered Medications (Other):  .  alum & mag hydroxide-simeth (MAALOX/MYLANTA) 200-200-20 MG/5ML suspension 30 mL .  hydrOXYzine (ATARAX/VISTARIL) tablet 25 mg .  hydrOXYzine (ATARAX/VISTARIL) tablet 25 mg .  levETIRAcetam (KEPPRA) tablet 500 mg .  lidocaine (LIDODERM) 5 % 1 patch .  magnesium hydroxide (MILK OF MAGNESIA) suspension 30 mL .  pantoprazole (PROTONIX) EC tablet 40 mg .  potassium chloride SA (K-DUR,KLOR-CON) CR tablet 20 mEq .  pregabalin (LYRICA) capsule 100 mg .  sertraline (ZOLOFT) tablet 150 mg .  tamsulosin (FLOMAX) capsule 0.4 mg  No current outpatient prescriptions on file.  Vital Signs:Blood pressure 122/71, pulse 72, temperature 97.9 F (36.6 C), temperature source Oral, resp. rate 16, height 5\' 10"  (1.778 m), weight 103.5 kg (228 lb 2.8 oz), SpO2 100 %.    Lab Results:  Results for orders placed or performed during the hospital encounter of 11/22/15 (from the past 48 hour(s))  Glucose, capillary     Status: Abnormal   Collection Time: 11/24/15  6:15 AM  Result Value Ref Range   Glucose-Capillary  110 (H) 65 - 99 mg/dL  Glucose, capillary     Status: None   Collection Time: 11/25/15  6:10 AM  Result Value Ref  Range   Glucose-Capillary 90 65 - 99 mg/dL    Physical Findings: AIMS: Facial and Oral Movements Muscles of Facial Expression: None, normal Lips and Perioral Area: None, normal Jaw: None, normal Tongue: None, normal,Extremity Movements Upper (arms, wrists, hands, fingers): None, normal Lower (legs, knees, ankles, toes): None, normal, Trunk Movements Neck, shoulders, hips: None, normal, Overall Severity Severity of abnormal movements (highest score from questions above): None, normal Incapacitation due to abnormal movements: None, normal Patient's awareness of abnormal movements (rate only patient's report): No Awareness, Dental Status Current problems with teeth and/or dentures?: No Does patient usually wear dentures?: No  CIWA:    COWS:      Assessment/Plan:Patient appears to be tolerating current medications well. He reports that his sleep is still poor with the Vistaril but elects to try it again tonight to see if there is improvement. He is working with social work for longer term program placement for his substance use issues.  Linard Millers, MD 11/25/2015, 1:39 PM

## 2015-11-25 NOTE — BHH Group Notes (Signed)
[place] LCSW Group Therapy 11/25/2015 1:15 PM Type of Therapy: Group Therapy Participation Level: Active  Participation Quality: Attentive  Affect: Depressed and agitated  Cognitive: Alert and Oriented  Insight: Developing/Improving and Engaged  Engagement in Therapy: Developing/Improving and Engaged  Modes of Intervention: Activity, Clarification, Confrontation, Discussion, Education, Exploration, Limit-setting, Orientation, Problem-solving, Rapport Building, Art therapist, Socialization and Support  Summary of Progress/Problems: Patient was attentive and engaged with speaker from [place]. Patient was attentive to speaker while they shared their story of dealing with mental health and overcoming it. Patient expressed interest in their programs and services and received information on their agency. Patient processed ways they can relate to the speaker. Patient discussed feeling frustrated because he is unable to get into a residential program at this time. He stated "It makes me feel like there was no point in even coming in here for help." Guest speaker provided patient with support and encouragement.   Tilden Fossa, LCSW Clinical Social Worker [facility] [phone]

## 2015-11-26 NOTE — Progress Notes (Signed)
D:  Patient sullen; refused to take morning medications; reportedly he is still upset because they have been unable to find a treatment facility that accepts his insurance. He reports passive suicidal ideation with no plan and he verbally contracts for safety; he denies homicidal ideation and AVH; no self-injurious behaviors noted or reported A:  Emotional support provided; encouraged him to seek assistance with needs/concerns. R:  Remains on q 15 minute safety checks; safety maintained on unit

## 2015-11-26 NOTE — Progress Notes (Signed)
  Writer was informed that the pt was sitting on the floor in his room. Pt stated, he'll just wait until he gets out tomorrow to do. Stated that [facility] couldn't help him, so he knows "there's nothing we can do". Stated, "he doesn't know why he came into the hosp in the first place."   Writer gave pt prn vistaril and encouraged him to get off the floor. Pt moved to the bench.

## 2015-11-26 NOTE — Progress Notes (Signed)
[facility] MD Progress Note  11/26/2015 12:53 PM  Patient Active Problem List   Diagnosis Date Noted  . Psychoactive substance-induced mood disorder ([place]) 11/22/2015  . Chest pain 11/12/2015  . Type 2 diabetes mellitus with vascular disease ([place]) 11/12/2015  . History of seizure 11/12/2015  . Cocaine abuse 01/26/2015  . Cocaine-induced mood disorder ([place]) 01/26/2015  . Helicobacter pylori gastritis 05/30/2012  . Pure hypercholesterolemia 08/31/2011  . Essential hypertension, benign 08/31/2011  . Postsurgical aortocoronary bypass status 08/31/2011  . PAD (peripheral artery disease) ([place]) 08/31/2011  . GERD (gastroesophageal reflux disease) 12/07/2010  . Esophageal dysphagia 12/07/2010  . Constipation 12/07/2010  . Laryngopharyngeal reflux (LPR) 12/07/2010  . Lumbar pain with radiation down left leg 11/17/2010  . CARPAL TUNNEL SYNDROME 07/14/2009  . SHOULDER IMPINGEMENT SYNDROME, LEFT 05/05/2009    Diagnosis: Cocaine use disorder, severe  Subjective: Dalton [name] reports that he had a very difficult day and night. He reports that he refused to take his medication and feels very frustrated. The problem is that he has insurance that is not accepted by any of the usual residential treatment programs for substances and this is been extremely demoralizing. "If I walk out the door I'm dead." He denies any homicidal ideation, but reports that he is having positive passive suicidal ideation with thoughts to end his life. He reports he didn't sleep at all last night tossing and turning thinking about this issue of not being able to get into a program.  Objective: Well developed well nourished man in no apparent distress who is pleasant and appropriate but somewhat irritable and dysphoric over his situation today. Speech and motor are within normal limits. Mood is described as "it's not going" affect is dysphoric and irritable thought processes are linear and goal directed but somewhat fixated thought  content endorses some suicidal ideation and denies current plan to act immediately however denies homicidal ideation he is alert and oriented insight and judgment are fair IQ appears an average range   Current Facility-Administered Medications (Endocrine & Metabolic):  .  metFORMIN (GLUCOPHAGE) tablet 500 mg   Current Facility-Administered Medications (Cardiovascular):  .  atorvastatin (LIPITOR) tablet 10 mg .  hydrochlorothiazide (HYDRODIURIL) tablet 25 mg .  lisinopril (PRINIVIL,ZESTRIL) tablet 2.5 mg .  metoprolol tartrate (LOPRESSOR) tablet 12.5 mg   Current Facility-Administered Medications (Respiratory):  .  albuterol (PROVENTIL HFA;VENTOLIN HFA) 108 (90 Base) MCG/ACT inhaler 2 puff .  fluticasone (FLONASE) 50 MCG/ACT nasal spray 2 spray .  fluticasone (FLOVENT HFA) 44 MCG/ACT inhaler 2 puff .  ipratropium (ATROVENT) nebulizer solution 0.5 mg .  loratadine (CLARITIN) tablet 10 mg   Current Facility-Administered Medications (Analgesics):  .  acetaminophen (TYLENOL) tablet 650 mg   Current Facility-Administered Medications (Hematological):  .  clopidogrel (PLAVIX) tablet 75 mg   Current Facility-Administered Medications (Other):  .  alum & mag hydroxide-simeth (MAALOX/MYLANTA) 200-200-20 MG/5ML suspension 30 mL .  hydrOXYzine (ATARAX/VISTARIL) tablet 25 mg .  hydrOXYzine (ATARAX/VISTARIL) tablet 25 mg .  levETIRAcetam (KEPPRA) tablet 500 mg .  lidocaine (LIDODERM) 5 % 1 patch .  magnesium hydroxide (MILK OF MAGNESIA) suspension 30 mL .  pantoprazole (PROTONIX) EC tablet 40 mg .  potassium chloride SA (K-DUR,KLOR-CON) CR tablet 20 mEq .  pregabalin (LYRICA) capsule 100 mg .  sertraline (ZOLOFT) tablet 150 mg .  tamsulosin (FLOMAX) capsule 0.4 mg  No current outpatient prescriptions on file.  Vital Signs:Blood pressure 105/86, pulse 96, temperature 98.6 F (37 C), temperature source Oral, resp. rate 16, height 5\' 10"  (  1.778 m), weight 103.5 kg (228 lb 2.8 oz), SpO2 100  %.    Lab Results:  Results for orders placed or performed during the hospital encounter of 11/22/15 (from the past 48 hour(s))  Glucose, capillary     Status: None   Collection Time: 11/25/15  6:10 AM  Result Value Ref Range   Glucose-Capillary 90 65 - 99 mg/dL    Physical Findings: AIMS: Facial and Oral Movements Muscles of Facial Expression: None, normal Lips and Perioral Area: None, normal Jaw: None, normal Tongue: None, normal,Extremity Movements Upper (arms, wrists, hands, fingers): None, normal Lower (legs, knees, ankles, toes): None, normal, Trunk Movements Neck, shoulders, hips: None, normal, Overall Severity Severity of abnormal movements (highest score from questions above): None, normal Incapacitation due to abnormal movements: None, normal Patient's awareness of abnormal movements (rate only patient's report): No Awareness, Dental Status Current problems with teeth and/or dentures?: No Does patient usually wear dentures?: No  CIWA:    COWS:      Assessment/Plan: The patient had something of a supple setback when he found out that he could not get into his preferred program secondary to his insurance. He has had some recurrence of suicidal ideation. He does not feel safe to be released and we also plan to continue to work with him to develop new alternatives for treatment. We will continue to assess his mood and safety as well.  Linard Millers, MD 11/26/2015, 12:53 PM

## 2015-11-26 NOTE — BHH Group Notes (Signed)
Patient attend group NA

## 2015-11-26 NOTE — Progress Notes (Signed)
   D: Pt was laying down in his room during the assessment. When asked about his day began to tell the writer about his experience with his treatment team. Stated, "she's a liar". Stated, "she told me they wouldn't accept my insurance, but I've been to [facility] twice and they accepted my insurance". Pt stated the person is "trying to get him to give money to a man that she knows". Stated, "those types of places don't work for me". Pt states he wants a 30 day or more program.   A:  Support and encouragement was offered. 15 min checks continued for safety.  R: Pt remains safe.

## 2015-11-26 NOTE — Progress Notes (Signed)
Patient took his 12 noon dose of Lyrica

## 2015-11-26 NOTE — BHH Group Notes (Signed)
[place] LCSW Group Therapy  11/26/2015 3:29 PM  Type of Therapy:  Group Therapy  Participation Level:  Active  Participation Quality:  Attentive  Affect:  Appropriate  Cognitive:  Alert, Appropriate and Oriented  Insight:  Developing/Improving and Improving  Engagement in Therapy:  Engaged  Modes of Intervention:  Discussion, Education and Exploration  Summary of Progress/Problems:   Group consisted of a discussion around emotional regulation.  Discussion consisted of understanding and defining 2 aspects of the emotional life:  Emotional experience (what you feel like on the inside) and the emotional expression (how you show your emotions in how you talk, behave, or gesture).    Dalton [name] did more active listening than talking in the group.  He engaged in the beginning when LCSW started discussion with a questionnaire.  He defined his way of regulating emotions as keeping them to himself and no showing emotion.  He reports he learned this at a very young age when he was 55 years old and he had a problem and was told to pray about it.  Dalton [name] was able to relate to the topic and understand how it affects his current adult life.  Although he was quiet and less talkative, he shows improvement in understanding how his emotional play part in his recovery by processing past experiences affecting him today.  Dalton [name] was awake and engaged throughout session.  Dalton [name] 11/26/2015, 3:29 PM

## 2015-11-26 NOTE — Progress Notes (Signed)
Recreation Therapy Notes  Date: 11/26/15 Time: 0930 Location: [address] Group Room  Group Topic: Stress Management  Goal Area(s) Addresses:  Patient will verbalize importance of using healthy stress management.  Patient will identify positive emotions associated with healthy stress management.   Intervention: Stress Management  Activity :  Progressive Muscle Relaxation.  LRT introduced the technique of progressive muscle relaxation to the group.  LRT read script to engage patients in the technique.  Patients were to follow along as LRT read a script to participate in activity.  Education:  Stress Management, Discharge Planning.   Education Outcome: Acknowledges edcuation/In group clarification offered/Needs additional education  Clinical Observations/Feedback: Pt did not attend group.     Victorino Sparrow, LRT/CTRS         Victorino Sparrow A 11/26/2015 11:51 AM

## 2015-11-26 NOTE — Progress Notes (Signed)
Patient refused CBG this am

## 2015-11-26 NOTE — Plan of Care (Signed)
Problem: Safety: Goal: Periods of time without injury will increase Outcome: Progressing Safety maintained on unit

## 2015-11-27 DIAGNOSIS — F1420 Cocaine dependence, uncomplicated: Secondary | ICD-10-CM | POA: Diagnosis present

## 2015-11-27 LAB — GLUCOSE, CAPILLARY: GLUCOSE-CAPILLARY: 92 mg/dL (ref 65–99)

## 2015-11-27 MED ORDER — LEVETIRACETAM 500 MG PO TABS: 500.0000 mg | ORAL_TABLET | Freq: Two times a day (BID) | ORAL | 0 refills | Status: DC

## 2015-11-27 MED ORDER — PREGABALIN 100 MG PO CAPS: 100.0000 mg | ORAL_CAPSULE | Freq: Three times a day (TID) | ORAL | 0 refills | Status: DC

## 2015-11-27 MED ORDER — TAMSULOSIN HCL 0.4 MG PO CAPS: 0.4000 mg | ORAL_CAPSULE | Freq: Every day | ORAL | 0 refills | Status: DC

## 2015-11-27 MED ORDER — SERTRALINE HCL 50 MG PO TABS: 150.0000 mg | ORAL_TABLET | Freq: Every day | ORAL | 0 refills | Status: DC

## 2015-11-27 MED ORDER — PANTOPRAZOLE SODIUM 40 MG PO TBEC: 40.0000 mg | DELAYED_RELEASE_TABLET | Freq: Every day | ORAL | 0 refills | Status: DC

## 2015-11-27 MED ORDER — METFORMIN HCL 500 MG PO TABS: 500.0000 mg | ORAL_TABLET | Freq: Two times a day (BID) | ORAL | 0 refills | Status: DC

## 2015-11-27 MED ORDER — HYDROCHLOROTHIAZIDE 25 MG PO TABS: 25.0000 mg | ORAL_TABLET | Freq: Every day | ORAL | 0 refills | Status: DC

## 2015-11-27 MED ORDER — LISINOPRIL 2.5 MG PO TABS: 2.5000 mg | ORAL_TABLET | Freq: Every day | ORAL | 0 refills | Status: DC

## 2015-11-27 MED ORDER — HYDROXYZINE HCL 25 MG PO TABS: 25.0000 mg | ORAL_TABLET | Freq: Four times a day (QID) | ORAL | 0 refills | Status: DC | PRN

## 2015-11-27 MED ORDER — ATORVASTATIN CALCIUM 10 MG PO TABS: 10.0000 mg | ORAL_TABLET | Freq: Every morning | ORAL | 0 refills | Status: DC

## 2015-11-27 MED ORDER — POTASSIUM CHLORIDE CRYS ER 20 MEQ PO TBCR: 20.0000 meq | EXTENDED_RELEASE_TABLET | Freq: Every day | ORAL | 0 refills | Status: DC

## 2015-11-27 MED ORDER — HYDROXYZINE HCL 50 MG PO TABS
50.0000 mg | ORAL_TABLET | Freq: Every evening | ORAL | Status: AC | PRN
  Administered 2015-11-27 – 2015-12-01 (×5): 50 mg via ORAL
  Filled 2015-11-27 (×5): qty 1

## 2015-11-27 MED ORDER — METOPROLOL TARTRATE 25 MG PO TABS: 12.5000 mg | ORAL_TABLET | Freq: Two times a day (BID) | ORAL | 0 refills | Status: DC

## 2015-11-27 MED ORDER — CLOPIDOGREL BISULFATE 75 MG PO TABS: 75.0000 mg | ORAL_TABLET | Freq: Every day | ORAL | 0 refills | Status: DC

## 2015-11-27 NOTE — Progress Notes (Signed)
D    Pt was complaining about where he would be sent upon discharge   He said it is drug infested and he doesn't feel safe   He wants to explore other options     He is sad and depressed and irritable     He interacts appropriately but minimally with others A   Verbal support given   Left voicemail for social worker regarding pt complaint   Medications administered and effectiveness monitored   Q 15 min checks R    Pt is safe at present

## 2015-11-27 NOTE — Clinical Social Work Note (Signed)
[facility] - has bed for patient. Can admit tomorrow.   Edwyna Shell, LCSW Lead Clinical Social Worker Phone:  [phone]

## 2015-11-27 NOTE — Clinical Social Work Note (Signed)
CSW spoke w Admissions at [facility] - no beds available.  Pt must call directly [phone]) to discuss admissions process.  Facility states that pt can call on Monday to see if there are open beds and discuss possible admission.  Will not take referral from Deputy, only patient can ask for admission.  Cannot be on any narcotic drugs in this program.   Edwyna Shell, [place] Worker Phone:  [phone]

## 2015-11-27 NOTE — Progress Notes (Signed)
   D: Pt was observed in the dayroom laughing and interacting appropriately with his peers. When asked about his day pt stated, "I made it better myself. I prayed about it". Pt states he plans to go to [facility] after discharge. States his twin sister lives their, and the shelters have good programs. Pt has no questions or concerns.    A:  Support and encouragement was offered. 15 min checks continued for safety.  R: Pt remains safe.

## 2015-11-27 NOTE — Progress Notes (Signed)
Patient became increasingly depressed in the evening and reported thoughts of slitting his throat if he had access to a knife.  Patient stated feels like his drug addiction is going to overtake him and he really desires to be clean.  Patient denies AVH.   Continue to assess patient for safety, offer medications as prescribed, engage patient in 1:1 staff talks.   Continue to monitor as prescribed.

## 2015-11-27 NOTE — BHH Group Notes (Signed)
[facility] LCSW Group Therapy  11/27/2015 3:17 PM  Type of Therapy:  Group Therapy  Participation Level:  Active  Participation Quality:  Attentive  Affect:  Appropriate  Cognitive:  Alert  Insight:  Improving  Engagement in Therapy:  Improving  Modes of Intervention:  Discussion, Education, Socialization and Support  Summary of Progress/Problems: Balance in life: Patients will discuss the concept of balance and how it looks and feels to be unbalanced. Pt will identify areas in their life that is unbalanced and ways to become more balanced. Pt identified family as trigger for relapse. He states his son sells drugs which is a trigger for him. He believes moving away from his family will be more beneficial for his mental health.   [place] MSW, LCSWA  11/27/2015, 3:17 PM

## 2015-11-27 NOTE — Progress Notes (Signed)
[facility] MD Progress Note  11/27/2015 10:39 AM  Patient Active Problem List   Diagnosis Date Noted  . Psychoactive substance-induced mood disorder ([place]) 11/22/2015  . Chest pain 11/12/2015  . Type 2 diabetes mellitus with vascular disease ([place]) 11/12/2015  . History of seizure 11/12/2015  . Cocaine abuse 01/26/2015  . Cocaine-induced mood disorder ([place]) 01/26/2015  . Helicobacter pylori gastritis 05/30/2012  . Pure hypercholesterolemia 08/31/2011  . Essential hypertension, benign 08/31/2011  . Postsurgical aortocoronary bypass status 08/31/2011  . PAD (peripheral artery disease) ([place]) 08/31/2011  . GERD (gastroesophageal reflux disease) 12/07/2010  . Esophageal dysphagia 12/07/2010  . Constipation 12/07/2010  . Laryngopharyngeal reflux (LPR) 12/07/2010  . Lumbar pain with radiation down left leg 11/17/2010  . CARPAL TUNNEL SYNDROME 07/14/2009  . SHOULDER IMPINGEMENT SYNDROME, LEFT 05/05/2009    Diagnosis: Cocaine use disorder severe  Subjective: Mr. Dutcher reports he is feeling better today. He states he is "tired of crying" and "is giving it to God." He states in terms of leaving and going to more treatment "I just want to get started" and he states he is ready to go as soon as the program is available. He denies any current suicidal or homicidal ideation, plan or intent and expresses positive motivation for recovery.  Objective: Well developed well nourished man in no apparent distress pleasant and cooperative speech and motor appear within normal limits mood is "better" affect is euthymic, thought processes linear and goal-directed thought content denies any psychotic symptoms, suicidal or homicidal ideation, plan or intent. Alert and oriented 3 insight and judgment are fair IQ appears an average range   Current Facility-Administered Medications (Endocrine & Metabolic):  .  metFORMIN (GLUCOPHAGE) tablet 500 mg   Current Facility-Administered Medications (Cardiovascular):  .   atorvastatin (LIPITOR) tablet 10 mg .  hydrochlorothiazide (HYDRODIURIL) tablet 25 mg .  lisinopril (PRINIVIL,ZESTRIL) tablet 2.5 mg .  metoprolol tartrate (LOPRESSOR) tablet 12.5 mg   Current Facility-Administered Medications (Respiratory):  .  albuterol (PROVENTIL HFA;VENTOLIN HFA) 108 (90 Base) MCG/ACT inhaler 2 puff .  fluticasone (FLONASE) 50 MCG/ACT nasal spray 2 spray .  fluticasone (FLOVENT HFA) 44 MCG/ACT inhaler 2 puff .  ipratropium (ATROVENT) nebulizer solution 0.5 mg .  loratadine (CLARITIN) tablet 10 mg   Current Facility-Administered Medications (Analgesics):  .  acetaminophen (TYLENOL) tablet 650 mg   Current Facility-Administered Medications (Hematological):  .  clopidogrel (PLAVIX) tablet 75 mg   Current Facility-Administered Medications (Other):  .  alum & mag hydroxide-simeth (MAALOX/MYLANTA) 200-200-20 MG/5ML suspension 30 mL .  hydrOXYzine (ATARAX/VISTARIL) tablet 25 mg .  hydrOXYzine (ATARAX/VISTARIL) tablet 50 mg .  levETIRAcetam (KEPPRA) tablet 500 mg .  lidocaine (LIDODERM) 5 % 1 patch .  magnesium hydroxide (MILK OF MAGNESIA) suspension 30 mL .  pantoprazole (PROTONIX) EC tablet 40 mg .  potassium chloride SA (K-DUR,KLOR-CON) CR tablet 20 mEq .  pregabalin (LYRICA) capsule 100 mg .  sertraline (ZOLOFT) tablet 150 mg .  tamsulosin (FLOMAX) capsule 0.4 mg  No current outpatient prescriptions on file.  Vital Signs:Blood pressure 113/83, pulse 75, temperature 97.8 F (36.6 C), temperature source Oral, resp. rate 18, height 5\' 10"  (1.778 m), weight 103.5 kg (228 lb 2.8 oz), SpO2 100 %.    Lab Results:  Results for orders placed or performed during the hospital encounter of 11/22/15 (from the past 48 hour(s))  Glucose, capillary     Status: None   Collection Time: 11/27/15  6:00 AM  Result Value Ref Range   Glucose-Capillary 92 65 -  99 mg/dL    Physical Findings: AIMS: Facial and Oral Movements Muscles of Facial Expression: None, normal Lips  and Perioral Area: None, normal Jaw: None, normal Tongue: None, normal,Extremity Movements Upper (arms, wrists, hands, fingers): None, normal Lower (legs, knees, ankles, toes): None, normal, Trunk Movements Neck, shoulders, hips: None, normal, Overall Severity Severity of abnormal movements (highest score from questions above): None, normal Incapacitation due to abnormal movements: None, normal Patient's awareness of abnormal movements (rate only patient's report): No Awareness, Dental Status Current problems with teeth and/or dentures?: No Does patient usually wear dentures?: No  CIWA:    COWS:      Assessment/Plan: Mr. Bernales has some barriers to obtaining a rehabilitation placement particularly his insurance. However the social worker today suggesting the [facility] and she will address that with him as being more readily available than programs in [place] which are sometimes hard to reach. The plan is to discharge Mr. Alioto as soon as appropriate follow-up has been arranged if he continues on his current course.  Linard Millers, MD 11/27/2015, 10:39 AM

## 2015-11-27 NOTE — BHH Suicide Risk Assessment (Signed)
Dalton [name] INPATIENT:  Family/Significant Other Suicide Prevention Education  Suicide Prevention Education:  Education Completed; Dalton [name], son, [phone],  (name of family member/significant other) has been identified by the patient as the family member/significant other with whom the patient will be residing, and identified as the person(s) who will aid the patient in the event of a mental health crisis (suicidal ideations/suicide attempt).  With written consent from the patient, the family member/significant other has been provided the following suicide prevention education, prior to the and/or following the discharge of the patient.  The suicide prevention education provided includes the following:  Suicide risk factors  Suicide prevention and interventions  National Suicide Hotline telephone number  [facility] assessment telephone number  [facility] Emergency Assistance [place] and/or Residential Mobile Crisis Unit telephone number  Request made of family/significant other to:  Remove weapons (e.g., guns, rifles, knives), all items previously/currently identified as safety concern.    Remove drugs/medications (over-the-counter, prescriptions, illicit drugs), all items previously/currently identified as a safety concern.  The family member/significant other verbalizes understanding of the suicide prevention education information provided.  The family member/significant other agrees to remove the items of safety concern listed above.  Son has spoken to father, states he is "kinda busy."  "He is a pain, just not really there."  Feels father has been making incorrect statements to son who is "now going to school" and "I cant be focused on my dad acting up and stuff."  Son feels pt needs residential substance abuse program at this point.  Has been concerned about father's suicidality in the past - "he starts talking crazy, doing stupid stuff."  Son lives  in another city, does not have much contact w father at this point.  Feels that he is unable to be of much assistance to father at this point, "we are all grown and we need to be an adult at this time."  Beverely Pace 11/27/2015, 11:40 AM

## 2015-11-27 NOTE — BHH Suicide Risk Assessment (Signed)
[facility] Discharge Suicide Risk Assessment   Principal Problem: Cocaine dependence, continuous [facility]) Discharge Diagnoses:  Patient Active Problem List   Diagnosis Date Noted  . Cocaine dependence, continuous ([place]) [F14.20] 11/27/2015  . Psychoactive substance-induced mood disorder ([place]) [id number], F06.30] 11/22/2015  . Chest pain [R07.9] 11/12/2015  . Type 2 diabetes mellitus with vascular disease ([place]) [E11.59] 11/12/2015  . History of seizure [Z87.898] 11/12/2015  . Cocaine abuse [F14.10] 01/26/2015  . Cocaine-induced mood disorder ([place]) [F14.94] 01/26/2015  . Helicobacter pylori gastritis [K29.70, B96.81] 05/30/2012  . Pure hypercholesterolemia [E78.00] 08/31/2011  . Essential hypertension, benign [I10] 08/31/2011  . 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 PAD (peripheral artery disease) ([place]) [I73.9] 08/31/2011  . GERD (gastroesophageal reflux disease) [K21.9] 12/07/2010  . Esophageal dysphagia [R13.10] 12/07/2010  . Constipation [K59.00] 12/07/2010  . Laryngopharyngeal reflux (LPR) [K21.9] 12/07/2010  . Lumbar pain with radiation down left leg [M54.5] 11/17/2010  . CARPAL TUNNEL SYNDROME [G56.00] 07/14/2009  . SHOULDER IMPINGEMENT SYNDROME, LEFT [M75.80] 05/05/2009    Total Time spent with patient: 20 minutes  Musculoskeletal: Strength & Muscle Tone: within normal limits Gait & Station: normal Patient leans: N/A  Psychiatric Specialty Exam: Review of Systems  All other systems reviewed and are negative.   Blood pressure 113/83, pulse 75, temperature 97.8 F (36.6 C), temperature source Oral, resp. rate 18, height 5\' 10"  (1.778 m), weight 103.5 kg (228 lb 2.8 oz), SpO2 100 %.Body mass index is 32.74 kg/m.  General Appearance: Fairly Groomed  Engineer, water::  Good  Speech:  Clear and Coherent409  Volume:  Normal  Mood:  Euthymic  Affect:  Congruent  Thought Process:  Coherent and Goal Directed  Orientation:  Full (Time, Place, and Person)  Thought  Content:  Negative  Suicidal Thoughts:  No  Homicidal Thoughts:  No  Memory:  Negative  Judgement:  Fair  Insight:  Fair  Psychomotor Activity:  Normal  Concentration:  Good  Recall:  Good  Fund of Knowledge:Good  Language: Good  Akathisia:  No  Handed:  Right  AIMS (if indicated):     Assets:  Desire for Improvement Resilience  Sleep:  Number of Hours: 6.75  Cognition: WNL  ADL's:  Intact   Mental Status Per Nursing Assessment::   On Admission:     Demographic Factors:  Low socioeconomic status and Unemployed  Loss Factors: Loss of significant relationship  Historical Factors: Family history of mental illness or substance abuse  Risk Reduction Factors:   Sense of responsibility to family and Positive coping skills or problem solving skills  Continued Clinical Symptoms:  Alcohol/Substance Abuse/Dependencies  Cognitive Features That Contribute To Risk:  None    Suicide Risk:  Mild:  Suicidal ideation of limited frequency, intensity, duration, and specificity.  There are no identifiable plans, no associated intent, mild dysphoria and related symptoms, good self-control (both objective and subjective assessment), few other risk factors, and identifiable protective factors, including available and accessible social support.  Follow-up Information    [facility] .   Why:  Appointment for initial assessment and entrance into services.   Contact information: [address] [place], [place] [address]  Phone: [phone]  Fax: [phone]            Plan Of Care/Follow-up recommendations:  Other:  The [facility] substance use disorders. He will have a bed available tomorrow and will be discharged tomorrow present course continues. Currently he does deny  any suicidal or homicidal ideation, plan or intent has been looking forward to beginning programming.  Linard Millers,  MD 11/27/2015, 3:13 PM

## 2015-11-28 LAB — GLUCOSE, CAPILLARY: GLUCOSE-CAPILLARY: 101 mg/dL — AB (ref 65–99)

## 2015-11-28 NOTE — BHH Group Notes (Signed)
[facility] LCSW Group Therapy  11/28/2015 3:18 PM  Type of Therapy:  Group Therapy  Participation Level:  Minimal  Participation Quality:  Attentive  Affect:  Flat  Cognitive:  Alert  Insight:  Limited  Engagement in Therapy:  Limited  Modes of Intervention:  Discussion, Education, Socialization and Support  Summary of Progress/Problems:Feelings around Relapse. Group members discussed the meaning of relapse and shared personal stories of relapse, how it affected them and others, and how they perceived themselves during this time. Group members were encouraged to identify triggers, warning signs and coping skills used when facing the possibility of relapse. Social supports were discussed and explored in detail. Pt attended group and stayed the entire time. Pt sat quietly and listened to other group members share.    [place] MSW, LCSWA  11/28/2015, 3:18 PM

## 2015-11-28 NOTE — Progress Notes (Signed)
[facility] MD Progress Note  11/28/2015 10:11 AM  Patient Active Problem List   Diagnosis Date Noted  . Cocaine dependence, continuous ([place]) 11/27/2015  . Psychoactive substance-induced mood disorder ([place]) 11/22/2015  . Chest pain 11/12/2015  . Type 2 diabetes mellitus with vascular disease ([place]) 11/12/2015  . History of seizure 11/12/2015  . Cocaine abuse 01/26/2015  . Cocaine-induced mood disorder ([place]) 01/26/2015  . Helicobacter pylori gastritis 05/30/2012  . Pure hypercholesterolemia 08/31/2011  . Essential hypertension, benign 08/31/2011  . Postsurgical aortocoronary bypass status 08/31/2011  . PAD (peripheral artery disease) ([place]) 08/31/2011  . GERD (gastroesophageal reflux disease) 12/07/2010  . Esophageal dysphagia 12/07/2010  . Constipation 12/07/2010  . Laryngopharyngeal reflux (LPR) 12/07/2010  . Lumbar pain with radiation down left leg 11/17/2010  . CARPAL TUNNEL SYNDROME 07/14/2009  . SHOULDER IMPINGEMENT SYNDROME, LEFT 05/05/2009    Diagnosis: Cocaine use disorder severe  Subjective: The patient was scheduled to be discharged today to attend a residential substance treatment program. However he does not wish to go to this program but would report prefer 1 in [place] and today reports that if he is released now he will not go to the program but will stand in front of our facility and cut his throat.  Objective: Well-developed well-nourished mildly irritable and anxious man in no physical distress. Speech and motor within normal limits. He describes his mood as unhappy and agitated, and affect is congruent. Thought processes are perseverative on not wanting to go to this treatment program and thought content he expresses feelings of rejection by [place] and dissatisfaction with his placement that are somewhat perseverative he endorses suicidal ideation with plan to cut his throat. No homicidal ideation or psychotic symptoms endorsed. Alert and oriented 3 insight and judgment are  limited IQ appears an average range   Current Facility-Administered Medications (Endocrine & Metabolic):  .  metFORMIN (GLUCOPHAGE) tablet 500 mg  Current Outpatient Prescriptions (Endocrine & Metabolic):  .  metFORMIN (GLUCOPHAGE) 500 MG tablet, Take 1 tablet (500 mg total) by mouth 2 (two) times daily with a meal.  Current Facility-Administered Medications (Cardiovascular):  .  atorvastatin (LIPITOR) tablet 10 mg .  hydrochlorothiazide (HYDRODIURIL) tablet 25 mg .  lisinopril (PRINIVIL,ZESTRIL) tablet 2.5 mg .  metoprolol tartrate (LOPRESSOR) tablet 12.5 mg  Current Outpatient Prescriptions (Cardiovascular):  .  atorvastatin (LIPITOR) 10 MG tablet, Take 1 tablet (10 mg total) by mouth every morning. .  hydrochlorothiazide (HYDRODIURIL) 25 MG tablet, Take 1 tablet (25 mg total) by mouth daily. [name] Kitchen  lisinopril (ZESTRIL) 2.5 MG tablet, Take 1 tablet (2.5 mg total) by mouth daily. .  metoprolol tartrate (LOPRESSOR) 25 MG tablet, Take 0.5 tablets (12.5 mg total) by mouth 2 (two) times daily.  Current Facility-Administered Medications (Respiratory):  .  albuterol (PROVENTIL HFA;VENTOLIN HFA) 108 (90 Base) MCG/ACT inhaler 2 puff .  fluticasone (FLONASE) 50 MCG/ACT nasal spray 2 spray .  fluticasone (FLOVENT HFA) 44 MCG/ACT inhaler 2 puff .  ipratropium (ATROVENT) nebulizer solution 0.5 mg .  loratadine (CLARITIN) tablet 10 mg   Current Facility-Administered Medications (Analgesics):  .  acetaminophen (TYLENOL) tablet 650 mg   Current Facility-Administered Medications (Hematological):  .  clopidogrel (PLAVIX) tablet 75 mg  Current Outpatient Prescriptions (Hematological):  .  clopidogrel (PLAVIX) 75 MG tablet, Take 1 tablet (75 mg total) by mouth daily before breakfast.  Current Facility-Administered Medications (Other):  .  alum & mag hydroxide-simeth (MAALOX/MYLANTA) 200-200-20 MG/5ML suspension 30 mL .  hydrOXYzine (ATARAX/VISTARIL) tablet 25 mg .  hydrOXYzine (ATARAX/VISTARIL)  tablet 50 mg .  levETIRAcetam (KEPPRA) tablet 500 mg .  lidocaine (LIDODERM) 5 % 1 patch .  magnesium hydroxide (MILK OF MAGNESIA) suspension 30 mL .  pantoprazole (PROTONIX) EC tablet 40 mg .  potassium chloride SA (K-DUR,KLOR-CON) CR tablet 20 mEq .  pregabalin (LYRICA) capsule 100 mg .  sertraline (ZOLOFT) tablet 150 mg .  tamsulosin (FLOMAX) capsule 0.4 mg  Current Outpatient Prescriptions (Other):  .  hydrOXYzine (ATARAX/VISTARIL) 25 MG tablet, Take 1 tablet (25 mg total) by mouth every 6 (six) hours as needed for anxiety. .  levETIRAcetam (KEPPRA) 500 MG tablet, Take 1 tablet (500 mg total) by mouth every 12 (twelve) hours. .  pantoprazole (PROTONIX) 40 MG tablet, Take 1 tablet (40 mg total) by mouth daily. .  potassium chloride SA (K-DUR,KLOR-CON) 20 MEQ tablet, Take 1 tablet (20 mEq total) by mouth daily. .  pregabalin (LYRICA) 100 MG capsule, Take 1 capsule (100 mg total) by mouth 3 (three) times daily. .  sertraline (ZOLOFT) 50 MG tablet, Take 3 tablets (150 mg total) by mouth daily. .  tamsulosin (FLOMAX) 0.4 MG CAPS capsule, Take 1 capsule (0.4 mg total) by mouth daily after breakfast.  Vital Signs:Blood pressure (!) 126/98, pulse 95, temperature 98.1 F (36.7 C), resp. rate 18, height 5\' 10"  (1.778 m), weight 103.5 kg (228 lb 2.8 oz), SpO2 100 %.    Lab Results:  Results for orders placed or performed during the hospital encounter of 11/22/15 (from the past 48 hour(s))  Glucose, capillary     Status: None   Collection Time: 11/27/15  6:00 AM  Result Value Ref Range   Glucose-Capillary 92 65 - 99 mg/dL  Glucose, capillary     Status: Abnormal   Collection Time: 11/28/15  6:02 AM  Result Value Ref Range   Glucose-Capillary 101 (H) 65 - 99 mg/dL   Comment 1 Notify RN    Comment 2 Document in Chart     Physical Findings: AIMS: Facial and Oral Movements Muscles of Facial Expression: None, normal Lips and Perioral Area: None, normal Jaw: None, normal Tongue: None,  normal,Extremity Movements Upper (arms, wrists, hands, fingers): None, normal Lower (legs, knees, ankles, toes): None, normal, Trunk Movements Neck, shoulders, hips: None, normal, Overall Severity Severity of abnormal movements (highest score from questions above): None, normal Incapacitation due to abnormal movements: None, normal Patient's awareness of abnormal movements (rate only patient's report): No Awareness, Dental Status Current problems with teeth and/or dentures?: No Does patient usually wear dentures?: No  CIWA:    COWS:      Assessment/Plan: This patient has a history of self-injurious behavior with numerous scars visible on his arms from previous cutting attempts. He is having a hard time with separation and interprets being discharged with being rejected which is precipitated an emotional crisis for him today. He is giving [place] information honestly about his emotional state and would be at risk for acting out discharged therefore we will postpone his discharge pending further evaluation. If he does come to terms with needing to move on to a new phase of treatment over the weekend and is able to contract for safety he could be discharged to the program. We will continue to monitor the patient's behavior and mental state.  Linard Millers, MD 11/28/2015, 10:11 AM

## 2015-11-28 NOTE — Progress Notes (Signed)
Attended group 

## 2015-11-28 NOTE — Progress Notes (Signed)
Recreation Therapy Notes  Date: 11/28/15 Time: 0930 Location: [address] Dayroom  Group Topic: Stress Management  Goal Area(s) Addresses:  Patient will verbalize importance of using healthy stress management.  Patient will identify positive emotions associated with healthy stress management.   Intervention: Stress Management  Activity :  [employer].  LRT introduced the technique of guided imagery.  LRT read script allowing patients to participate and engage in the technique.  Patients were to follow along as LRT read script.  Education:  Stress Management, Discharge Planning.   Education Outcome: Acknowledges edcuation/In group clarification offered/Needs additional education  Clinical Observations/Feedback: Pt did not attend group.     Victorino Sparrow, LRT/CTRS         Victorino Sparrow A 11/28/2015 12:02 PM

## 2015-11-29 DIAGNOSIS — Z833 Family history of diabetes mellitus: Secondary | ICD-10-CM

## 2015-11-29 DIAGNOSIS — Z8249 Family history of ischemic heart disease and other diseases of the circulatory system: Secondary | ICD-10-CM

## 2015-11-29 DIAGNOSIS — Z8371 Family history of colonic polyps: Secondary | ICD-10-CM

## 2015-11-29 DIAGNOSIS — Z800 Family history of malignant neoplasm of digestive organs: Secondary | ICD-10-CM

## 2015-11-29 DIAGNOSIS — Z79899 Other long term (current) drug therapy: Secondary | ICD-10-CM

## 2015-11-29 DIAGNOSIS — F1420 Cocaine dependence, uncomplicated: Secondary | ICD-10-CM

## 2015-11-29 DIAGNOSIS — R45851 Suicidal ideations: Secondary | ICD-10-CM

## 2015-11-29 LAB — GLUCOSE, CAPILLARY: Glucose-Capillary: 101 mg/dL — ABNORMAL HIGH (ref 65–99)

## 2015-11-29 MED ORDER — ARIPIPRAZOLE 2 MG PO TABS
2.0000 mg | ORAL_TABLET | Freq: Every day | ORAL | Status: DC
  Administered 2015-11-29 – 2015-12-02 (×4): 2 mg via ORAL
  Filled 2015-11-29 (×5): qty 1

## 2015-11-29 MED ORDER — ARIPIPRAZOLE 2 MG PO TABS
ORAL_TABLET | ORAL | Status: AC
  Filled 2015-11-29: qty 1

## 2015-11-29 NOTE — Progress Notes (Signed)
Patient reports passive SI.  Patient stated that he is able to contract for safety, but he still has chronic suicidal thoughts. Patient has been compliant with medications and attends groups.    Assess patient for safety, offer medications as prescribed, engage patient in 1:1 staff talks.   Continue to monitor as prescribed

## 2015-11-29 NOTE — Progress Notes (Signed)
Pt attended the evening AA speaker meeting. Pt was engaged and appropriate. 

## 2015-11-29 NOTE — Progress Notes (Signed)
D    Pt continues to endorse some suicidal thoughts and fears being discharged and what he might do if he was discharged     He contracts for safety at [facility]    He attends and participates in groups  A   Verbal support given   Medications administered and effectiveness monitored   Q 15 min checks R   Pt is safe at present

## 2015-11-29 NOTE — Progress Notes (Signed)
D    Pt continues to endorse some suicidal thoughts and fears being discharged and what he might do if he was discharged     He contracts for safety at [facility]    He attends and participates in groups  A   Verbal support given   Medications administered and effectiveness monitored   Q 15 min checks R   Pt is safe at present

## 2015-11-29 NOTE — [facility] Group Notes (Addendum)
Adult Therapy Group Note  Date: 11/29/2015 Time:  10:00-11:00AM  Group Topic/Focus: Unhealthy Coping Skills versus Healthy Coping Skills  Building Self Esteem:   The Focus of this group was to assist patients in becoming aware of the differences between healthy and unhealthy coping techniques, as well as how to determine which type they are using.   Reasons for choosing the unhealthy techniques were explored, which helped patients to provide support to each other and to determine that, in fact, they are not alone.  Participation Level:  Active  Participation Quality:  Attentive  Affect:  Depressed and Flat  Cognitive:  Oriented  Insight: Limited  Engagement in Group:  Limited  Modes of Intervention:  Discussion, Exploration and Support  Additional Comments:  The patient expressed that a healthy coping technique he uses is listening to music, while he will use crack cocaine sometimes to deal with problems, and that is very unhealthy and has devastated his life.  After the discussion had proceeded about 10 minutes, he then asked CSW and other patients "What if there is really no reason left for a person to live, no chance that things are going to change."  He conveyed repeatedly that he has made the decision to die, and nothing is going to change that.  He could not think of any reasons to live, even after listening to other group members listing their reasons to live.  CSW met with him 1:1 after group, and he did state that his grandchildren are very important to him and could be a reason to live.  Berlin Hun Grossman-Orr 11/22/2015, 12:35 PM

## 2015-11-29 NOTE — Progress Notes (Signed)
[facility] MD Progress Note  11/29/2015 5:01 PM Dalton [name]  MRN:  [id number] Subjective: Patient reports " I don't feel like nothing." states he is still experiencing suicidal thoughts  Objective:Kilian E Ringstaff is awake, alert and oriented X4. Seen resting in bedroom.  Reports suicidal ideation. However is hopeful for residential treatment.  Denies homicidal ideation. Denies auditory or visual hallucination and does not appear to be responding to internal stimuli. Patient reports his family has disowned him so " I have nothing to live for.'" Patient reports he is medication compliant without mediation side effects.   States he is always depressed 8/10 Reports good appetite and reports resting well. Support, encouragement and reassurance was provided.   Principal Problem: Cocaine dependence, continuous ([place]) Diagnosis:   Patient Active Problem List   Diagnosis Date Noted  . Cocaine dependence, continuous ([place]) [F14.20] 11/27/2015  . Psychoactive substance-induced mood disorder ([place]) [id number], F06.30] 11/22/2015  . Chest pain [R07.9] 11/12/2015  . Type 2 diabetes mellitus with vascular disease ([place]) [E11.59] 11/12/2015  . History of seizure [Z87.898] 11/12/2015  . Cocaine abuse [F14.10] 01/26/2015  . Cocaine-induced mood disorder ([place]) [F14.94] 01/26/2015  . Helicobacter pylori gastritis [K29.70, B96.81] 05/30/2012  . Pure hypercholesterolemia [E78.00] 08/31/2011  . Essential hypertension, benign [I10] 08/31/2011  . 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 PAD (peripheral artery disease) ([place]) [I73.9] 08/31/2011  . GERD (gastroesophageal reflux disease) [K21.9] 12/07/2010  . Esophageal dysphagia [R13.10] 12/07/2010  . Constipation [K59.00] 12/07/2010  . Laryngopharyngeal reflux (LPR) [K21.9] 12/07/2010  . Lumbar pain with radiation down left leg [M54.5] 11/17/2010  . CARPAL TUNNEL SYNDROME [G56.00] 07/14/2009  . SHOULDER IMPINGEMENT SYNDROME, LEFT [M75.80]  05/05/2009   Total Time spent with patient: 30 minutes  Past Psychiatric History:  Past Medical History:  Past Medical History:  Diagnosis Date  . Anemia   . Arthritis   . Bronchitis, chronic ([place])   . CHF (congestive heart failure) ([place])   . Chronic back pain    Pain Clinic in [place]  . Chronic bronchitis   . Congestive heart failure (CHF) ([place])   . Coronary artery disease   . Depression   . Diabetes mellitus without complication ([place])    TYPE 2  . Frequency of urination   . GERD (gastroesophageal reflux disease)   . Headache(784.0)   . High cholesterol   . Hypertension   . Laceration of right hand 11/27/10  . Laceration of wrist 2007 BIL FOREARMS  . MI (myocardial infarction)    7, last one was in 2011  . PUD (peptic ulcer disease)    in 1990s, secondary to medication  . Seizures ([place])    entire life, last seizure in 2011;unknown etiology-pt sts heriditary  . Shortness of breath    with exertion  . Tonsillitis, chronic    Dr. Vicki Mallet in [place]    Past Surgical History:  Procedure Laterality Date  . BACK SURGERY     3-  . BIOPSY N/A 05/30/2012   Procedure: BIOPSY;  Surgeon: Danie Binder, MD;  Location: [facility];  Service: Endoscopy;  Laterality: N/A;  . CARPAL TUNNEL RELEASE     bilateral  . COLONOSCOPY  12/28/10   SLF: (MAC)Internal hemorrhoids/four small colon polyps  . CORONARY ARTERY BYPASS GRAFT  2002   3 vessels  . ESOPHAGOGASTRODUODENOSCOPY N/A 05/30/2012   SLF: UNCONTROLLED GERD DUE TO LIFESTYLE CHOICE/WEIGHT GAIN/MILD Non-erosive gastritis  . KNEE SURGERY     left-plate to left knee cap from accident  .  LEFT HEART CATHETERIZATION WITH CORONARY ANGIOGRAM N/A 08/31/2011   Procedure: LEFT HEART CATHETERIZATION WITH CORONARY ANGIOGRAM;  Surgeon: Laverda Page, MD;  Location: [facility] CATH LAB;  Service: Cardiovascular;  Laterality: N/A;  . SAVORY DILATION  12/28/2010   SLF:(MAC)J-shaped stomach/nodular mocosa in the distal esophagus/empiric dilation  42mm   Family History:  Family History  Problem Relation Age of Onset  . Diabetes Mother   . Hypertension Mother   . Heart attack Mother   . Hypertension Father   . Diabetes Father   . Heart attack Father   . Heart attack      mother, father, brother, sister all deceased due to MI  . Heart attack Sister   . Heart attack Brother   . Seizures Brother   . Heart failure Other   . Colon cancer Neg Hx   . Liver disease Neg Hx   . Anesthesia problems Neg Hx   . Hypotension Neg Hx   . Malignant hyperthermia Neg Hx   . Pseudochol deficiency Neg Hx   . Colon polyps Neg Hx    Family Psychiatric  History: See above Social History:  History  Alcohol Use No    Comment: Occasions.     History  Drug Use  . Frequency: 1.0 time per week  . Types: "Crack" cocaine, Cocaine    Comment: history of cocaine, etoh, marijuana but denies any the last several years-12 yrs ago    Social History   Social History  . Marital status: Single    Spouse name: N/A  . Number of children: 2  . Years of education: N/A   Occupational History  . disabled Not Employed   Social History Main Topics  . Smoking status: Never Smoker  . Smokeless tobacco: Never Used     Comment: Never Smoked  . Alcohol use No     Comment: Occasions.  . Drug use:     Frequency: 1.0 time per week    Types: "Crack" cocaine, Cocaine     Comment: history of cocaine, etoh, marijuana but denies any the last several years-12 yrs ago  . Sexual activity: No   Other Topics Concern  . None   Social History Narrative   Lives w/ son-23/22   Additional Social History:    Pain Medications: See PTA Prescriptions: See PTA Over the Counter: denies History of alcohol / drug use?: Yes Longest period of sobriety (when/how long): about 10-15 years Negative Consequences of Use: Personal relationships, Financial Name of Substance 1: Cocaine 1 - Age of First Use: 36 1 - Amount (size/oz): Varied 1 - Frequency: Weekly 1 - Duration:  Ongoing 1 - Last Use / Amount: 11/20/15                  Sleep: Fair  Appetite:  Good  Current Medications: Current Facility-Administered Medications  Medication Dose Route Frequency Provider Last Rate Last Dose  . acetaminophen (TYLENOL) tablet 650 mg  650 mg Oral Q6H PRN Laverle Hobby, PA-C   650 mg at 11/28/15 2247  . albuterol (PROVENTIL HFA;VENTOLIN HFA) 108 (90 Base) MCG/ACT inhaler 2 puff  2 puff Inhalation Q6H PRN Jenne Campus, MD   2 puff at 11/24/15 1323  . alum & mag hydroxide-simeth (MAALOX/MYLANTA) 200-200-20 MG/5ML suspension 30 mL  30 mL Oral Q4H PRN Laverle Hobby, PA-C      . atorvastatin (LIPITOR) tablet 10 mg  10 mg Oral q morning - 10a Laverle Hobby, PA-C  10 mg at 11/29/15 1250  . clopidogrel (PLAVIX) tablet 75 mg  75 mg Oral QAC breakfast Laverle Hobby, PA-C   75 mg at 11/29/15 0820  . fluticasone (FLONASE) 50 MCG/ACT nasal spray 2 spray  2 spray Each Nare Daily PRN Laverle Hobby, PA-C      . fluticasone (FLOVENT HFA) 44 MCG/ACT inhaler 2 puff  2 puff Inhalation BID Jenne Campus, MD   2 puff at 11/29/15 0819  . hydrochlorothiazide (HYDRODIURIL) tablet 25 mg  25 mg Oral Daily Laverle Hobby, PA-C   25 mg at 11/29/15 [id number]  . hydrOXYzine (ATARAX/VISTARIL) tablet 25 mg  25 mg Oral Q6H PRN Laverle Hobby, PA-C   25 mg at 11/28/15 1340  . hydrOXYzine (ATARAX/VISTARIL) tablet 50 mg  50 mg Oral QHS PRN Linard Millers, MD   50 mg at 11/28/15 2246  . ipratropium (ATROVENT) nebulizer solution 0.5 mg  0.5 mg Nebulization Q6H PRN Laverle Hobby, PA-C   0.5 mg at 11/23/15 1558  . levETIRAcetam (KEPPRA) tablet 500 mg  500 mg Oral Q12H Laverle Hobby, PA-C   500 mg at 11/29/15 [id number]  . lidocaine (LIDODERM) 5 % 1 patch  1 patch Transdermal Daily Norman Clay, MD   1 patch at 11/29/15 0826  . lisinopril (PRINIVIL,ZESTRIL) tablet 2.5 mg  2.5 mg Oral Daily Laverle Hobby, PA-C   2.5 mg at 11/29/15 [id number]  . loratadine (CLARITIN) tablet 10 mg  10 mg Oral  Daily Laverle Hobby, PA-C   10 mg at 11/29/15 [id number]  . magnesium hydroxide (MILK OF MAGNESIA) suspension 30 mL  30 mL Oral Daily PRN Laverle Hobby, PA-C      . metFORMIN (GLUCOPHAGE) tablet 500 mg  500 mg Oral BID WC Laverle Hobby, PA-C   500 mg at 11/29/15 0820  . metoprolol tartrate (LOPRESSOR) tablet 12.5 mg  12.5 mg Oral BID Laverle Hobby, PA-C   12.5 mg at 11/29/15 0820  . pantoprazole (PROTONIX) EC tablet 40 mg  40 mg Oral Daily Laverle Hobby, PA-C   40 mg at 11/29/15 [id number]  . potassium chloride SA (K-DUR,KLOR-CON) CR tablet 20 mEq  20 mEq Oral Daily Laverle Hobby, PA-C   20 mEq at 11/29/15 0820  . pregabalin (LYRICA) capsule 100 mg  100 mg Oral TID Laverle Hobby, PA-C   100 mg at 11/29/15 1250  . sertraline (ZOLOFT) tablet 150 mg  150 mg Oral Daily Norman Clay, MD   150 mg at 11/29/15 0820  . tamsulosin (FLOMAX) capsule 0.4 mg  0.4 mg Oral QPC breakfast Laverle Hobby, PA-C   0.4 mg at 11/29/15 [id number]    Lab Results:  Results for orders placed or performed during the hospital encounter of 11/22/15 (from the past 48 hour(s))  Glucose, capillary     Status: Abnormal   Collection Time: 11/28/15  6:02 AM  Result Value Ref Range   Glucose-Capillary 101 (H) 65 - 99 mg/dL   Comment 1 Notify RN    Comment 2 Document in Chart   Glucose, capillary     Status: Abnormal   Collection Time: 11/29/15  6:06 AM  Result Value Ref Range   Glucose-Capillary 101 (H) 65 - 99 mg/dL    Blood Alcohol level:  Lab Results  Component Value Date   ETH <5 11/21/2015   ETH <5 [id number]    Metabolic Disorder Labs: Lab Results  Component Value Date   HGBA1C 5.3 11/14/2015  MPG 105 11/14/2015   MPG 123 10/24/2008   No results found for: PROLACTIN Lab Results  Component Value Date   CHOL  04/17/2010    185        ATP III CLASSIFICATION:  <200     mg/dL   Desirable  200-239  mg/dL   Borderline High  >=240    mg/dL   High          TRIG 75 04/17/2010   HDL 44 04/17/2010   CHOLHDL  4.2 04/17/2010   VLDL 15 04/17/2010   LDLCALC (H) 04/17/2010    126        Total Cholesterol/HDL:CHD Risk Coronary Heart Disease Risk Table                     Men   Women  1/2 Average Risk   3.4   3.3  Average Risk       5.0   4.4  2 X Average Risk   9.6   7.1  3 X Average Risk  23.4   11.0        Use the calculated Patient Ratio above and the CHD Risk Table to determine the patient's CHD Risk.        ATP III CLASSIFICATION (LDL):  <100     mg/dL   Optimal  100-129  mg/dL   Near or Above                    Optimal  130-159  mg/dL   Borderline  160-189  mg/dL   High  >190     mg/dL   Very High   LDLCALC (H) 10/24/2008    116        Total Cholesterol/HDL:CHD Risk Coronary Heart Disease Risk Table                     Men   Women  1/2 Average Risk   3.4   3.3  Average Risk       5.0   4.4  2 X Average Risk   9.6   7.1  3 X Average Risk  23.4   11.0        Use the calculated Patient Ratio above and the CHD Risk Table to determine the patient's CHD Risk.        ATP III CLASSIFICATION (LDL):  <100     mg/dL   Optimal  100-129  mg/dL   Near or Above                    Optimal  130-159  mg/dL   Borderline  160-189  mg/dL   High  >190     mg/dL   Very High    Physical Findings: AIMS: Facial and Oral Movements Muscles of Facial Expression: None, normal Lips and Perioral Area: None, normal Jaw: None, normal Tongue: None, normal,Extremity Movements Upper (arms, wrists, hands, fingers): None, normal Lower (legs, knees, ankles, toes): None, normal, Trunk Movements Neck, shoulders, hips: None, normal, Overall Severity Severity of abnormal movements (highest score from questions above): None, normal Incapacitation due to abnormal movements: None, normal Patient's awareness of abnormal movements (rate only patient's report): No Awareness, Dental Status Current problems with teeth and/or dentures?: No Does patient usually wear dentures?: No  CIWA:    COWS:      Musculoskeletal: Strength & Muscle Tone: within normal limits Gait & Station: normal Patient leans: Right  Psychiatric Specialty Exam: Physical Exam  Nursing note and vitals reviewed. Constitutional: He is oriented to person, place, and time. He appears well-developed.  Cardiovascular: Normal rate.   Neurological: He is alert and oriented to person, place, and time.  Psychiatric: He has a normal mood and affect. His behavior is normal.    Review of Systems  Psychiatric/Behavioral: Positive for depression, substance abuse and suicidal ideas. The patient has insomnia.     Blood pressure 118/72, pulse 70, temperature 97.6 F (36.4 C), resp. rate 18, height 5\' 10"  (1.778 m), weight 103.5 kg (228 lb 2.8 oz), SpO2 100 %.Body mass index is 32.74 kg/m.  General Appearance: Casual  Eye Contact:  Good  Speech:  Clear and Coherent  Volume:  Normal  Mood:  Depressed  Affect:  Congruent  Thought Process:  Coherent  Orientation:  Full (Time, Place, and Person)  Thought Content:  Hallucinations: None and Rumination  Suicidal Thoughts:  Yes.  with intent/plan patient is able to contract for safety  Homicidal Thoughts:  No  Memory:  Immediate;   Fair Recent;   Fair Remote;   Fair  Judgement:  Intact  Insight:  Lacking  Psychomotor Activity:  Restlessness  Concentration:  Concentration: Fair  Recall:  [place] of Knowledge:  Good  Language:  Good  Akathisia:  Yes  Handed:  Right  AIMS (if indicated):     Assets:  Communication Skills Desire for Improvement Social Support Transportation  ADL's:  Intact  Cognition:  WNL  Sleep:  Number of Hours: 6     I agree with current treatment plan on 11/29/2015, Patient seen face-to-face for psychiatric evaluation follow-up, chart reviewed. Reviewed the information documented and agree with the treatment plan.  Treatment Plan Summary: Daily contact with patient to assess and evaluate symptoms and progress in treatment and Medication  management   Start Abilify 2 mg PO with tirtration Continue with Zoloft 150, Lyric 100mg s for mood stabilization. Will continue to monitor vitals ,medication compliance and treatment side effects while patient is here.  CSW will start working on disposition.  Patient to participate in therapeutic milieu  Derrill [place], NP 11/29/2015, 5:01 PM   Agree with NP progress note as above

## 2015-11-30 DIAGNOSIS — F331 Major depressive disorder, recurrent, moderate: Secondary | ICD-10-CM | POA: Diagnosis present

## 2015-11-30 NOTE — Progress Notes (Signed)
[facility] MD Progress Note  11/30/2015 5:45 PM Dalton [name]  MRN:  [id number] Subjective: Patient reports " I feel a little bit better today overall. Better than the past few days. I feel just a mild headache though."  Objective: Pt seen and chart reviewed. Pt is alert/oriented x4, calm, cooperative, and appropriate to situation. Pt denies homicidal ideation and psychosis and does not appear to be responding to internal stimuli. Pt states that he has had some fleeting suicidal thoughts but that he has no plan or intent at this time.  Principal Problem: Cocaine dependence, continuous ([place]) Diagnosis:   Patient Active Problem List   Diagnosis Date Noted  . MDD (major depressive disorder), recurrent episode, moderate ([place]) [F33.1] 11/30/2015    Priority: High  . Cocaine dependence, continuous ([place]) [F14.20] 11/27/2015    Priority: High  . Psychoactive substance-induced mood disorder ([place]) [id number], F06.30] 11/22/2015    Priority: High  . Cocaine-induced mood disorder [facility]) [F14.94] 01/26/2015    Priority: High  . Chest pain [R07.9] 11/12/2015  . Type 2 diabetes mellitus with vascular disease ([place]) [E11.59] 11/12/2015  . History of seizure [Z87.898] 11/12/2015  . Cocaine abuse [F14.10] 01/26/2015  . Helicobacter pylori gastritis [K29.70, B96.81] 05/30/2012  . Pure hypercholesterolemia [E78.00] 08/31/2011  . Essential hypertension, benign [I10] 08/31/2011  . 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 PAD (peripheral artery disease) ([place]) [I73.9] 08/31/2011  . GERD (gastroesophageal reflux disease) [K21.9] 12/07/2010  . Esophageal dysphagia [R13.10] 12/07/2010  . Constipation [K59.00] 12/07/2010  . Laryngopharyngeal reflux (LPR) [K21.9] 12/07/2010  . Lumbar pain with radiation down left leg [M54.5] 11/17/2010  . CARPAL TUNNEL SYNDROME [G56.00] 07/14/2009  . SHOULDER IMPINGEMENT SYNDROME, LEFT [M75.80] 05/05/2009   Total Time spent with patient: 30 minutes  Past  Psychiatric History:  Past Medical History:  Past Medical History:  Diagnosis Date  . Anemia   . Arthritis   . Bronchitis, chronic ([place])   . CHF (congestive heart failure) ([place])   . Chronic back pain    Pain Clinic in [place]  . Chronic bronchitis   . Congestive heart failure (CHF) ([place])   . Coronary artery disease   . Depression   . Diabetes mellitus without complication ([place])    TYPE 2  . Frequency of urination   . GERD (gastroesophageal reflux disease)   . Headache(784.0)   . High cholesterol   . Hypertension   . Laceration of right hand 11/27/10  . Laceration of wrist 2007 BIL FOREARMS  . MI (myocardial infarction)    7, last one was in 2011  . PUD (peptic ulcer disease)    in 1990s, secondary to medication  . Seizures ([place])    entire life, last seizure in 2011;unknown etiology-pt sts heriditary  . Shortness of breath    with exertion  . Tonsillitis, chronic    Dr. Vicki Mallet in [place]    Past Surgical History:  Procedure Laterality Date  . BACK SURGERY     3-  . BIOPSY N/A 05/30/2012   Procedure: BIOPSY;  Surgeon: Danie Binder, MD;  Location: [facility];  Service: Endoscopy;  Laterality: N/A;  . CARPAL TUNNEL RELEASE     bilateral  . COLONOSCOPY  12/28/10   SLF: ([facility])Internal hemorrhoids/four small colon polyps  . CORONARY ARTERY BYPASS GRAFT  2002   3 vessels  . ESOPHAGOGASTRODUODENOSCOPY N/A 05/30/2012   SLF: UNCONTROLLED GERD DUE TO LIFESTYLE CHOICE/WEIGHT GAIN/MILD Non-erosive gastritis  . KNEE SURGERY     left-plate to left  knee cap from accident  . LEFT HEART CATHETERIZATION WITH CORONARY ANGIOGRAM N/A 08/31/2011   Procedure: LEFT HEART CATHETERIZATION WITH CORONARY ANGIOGRAM;  Surgeon: Laverda Page, MD;  Location: [facility] CATH LAB;  Service: Cardiovascular;  Laterality: N/A;  . SAVORY DILATION  12/28/2010   SLF:([facility])J-shaped stomach/nodular mocosa in the distal esophagus/empiric dilation 19mm   Family History:  Family History  Problem Relation Age of  Onset  . Diabetes Mother   . Hypertension Mother   . Heart attack Mother   . Hypertension Father   . Diabetes Father   . Heart attack Father   . Heart attack      mother, father, brother, sister all deceased due to MI  . Heart attack Sister   . Heart attack Brother   . Seizures Brother   . Heart failure Other   . Colon cancer Neg Hx   . Liver disease Neg Hx   . Anesthesia problems Neg Hx   . Hypotension Neg Hx   . Malignant hyperthermia Neg Hx   . Pseudochol deficiency Neg Hx   . Colon polyps Neg Hx    Family Psychiatric  History: See above Social History:  History  Alcohol Use No    Comment: Occasions.     History  Drug Use  . Frequency: 1.0 time per week  . Types: "Crack" cocaine, Cocaine    Comment: history of cocaine, etoh, marijuana but denies any the last several years-12 yrs ago    Social History   Social History  . Marital status: Single    Spouse name: N/A  . Number of children: 2  . Years of education: N/A   Occupational History  . disabled Not Employed   Social History Main Topics  . Smoking status: Never Smoker  . Smokeless tobacco: Never Used     Comment: Never Smoked  . Alcohol use No     Comment: Occasions.  . Drug use:     Frequency: 1.0 time per week    Types: "Crack" cocaine, Cocaine     Comment: history of cocaine, etoh, marijuana but denies any the last several years-12 yrs ago  . Sexual activity: No   Other Topics Concern  . None   Social History Narrative   Lives w/ son-23/22   Additional Social History:    Pain Medications: See PTA Prescriptions: See PTA Over the Counter: denies History of alcohol / drug use?: Yes Longest period of sobriety (when/how long): about 10-15 years Negative Consequences of Use: Personal relationships, Financial Name of Substance 1: Cocaine 1 - Age of First Use: 36 1 - Amount (size/oz): Varied 1 - Frequency: Weekly 1 - Duration: Ongoing 1 - Last Use / Amount: 11/20/15                   Sleep: Fair  Appetite:  Good  Current Medications: Current Facility-Administered Medications  Medication Dose Route Frequency Provider Last Rate Last Dose  . acetaminophen (TYLENOL) tablet 650 mg  650 mg Oral Q6H PRN Laverle Hobby, PA-C   650 mg at 11/30/15 0859  . albuterol (PROVENTIL HFA;VENTOLIN HFA) 108 (90 Base) MCG/ACT inhaler 2 puff  2 puff Inhalation Q6H PRN Jenne Campus, MD   2 puff at 11/30/15 0809  . alum & mag hydroxide-simeth (MAALOX/MYLANTA) 200-200-20 MG/5ML suspension 30 mL  30 mL Oral Q4H PRN Laverle Hobby, PA-C      . ARIPiprazole (ABILIFY) tablet 2 mg  2 mg Oral Daily Derrill Center,  NP   2 mg at 11/30/15 0808  . atorvastatin (LIPITOR) tablet 10 mg  10 mg Oral q morning - 10a Laverle Hobby, PA-C   10 mg at 11/30/15 1134  . clopidogrel (PLAVIX) tablet 75 mg  75 mg Oral QAC breakfast Laverle Hobby, PA-C   75 mg at 11/30/15 [id number]  . fluticasone (FLONASE) 50 MCG/ACT nasal spray 2 spray  2 spray Each Nare Daily PRN Laverle Hobby, PA-C      . fluticasone (FLOVENT HFA) 44 MCG/ACT inhaler 2 puff  2 puff Inhalation BID Jenne Campus, MD   2 puff at 11/30/15 0809  . hydrochlorothiazide (HYDRODIURIL) tablet 25 mg  25 mg Oral Daily Laverle Hobby, PA-C   25 mg at 11/30/15 0810  . hydrOXYzine (ATARAX/VISTARIL) tablet 25 mg  25 mg Oral Q6H PRN Laverle Hobby, PA-C   25 mg at 11/30/15 0859  . hydrOXYzine (ATARAX/VISTARIL) tablet 50 mg  50 mg Oral QHS PRN Linard Millers, MD   50 mg at 11/29/15 2146  . ipratropium (ATROVENT) nebulizer solution 0.5 mg  0.5 mg Nebulization Q6H PRN Laverle Hobby, PA-C   0.5 mg at 11/23/15 1558  . levETIRAcetam (KEPPRA) tablet 500 mg  500 mg Oral Q12H Maurine Minister Simon, PA-C   500 mg at 11/30/15 0818  . lidocaine (LIDODERM) 5 % 1 patch  1 patch Transdermal Daily Norman Clay, MD   1 patch at 11/30/15 0810  . lisinopril (PRINIVIL,ZESTRIL) tablet 2.5 mg  2.5 mg Oral Daily Laverle Hobby, PA-C   2.5 mg at 11/30/15 [id number]  . loratadine  (CLARITIN) tablet 10 mg  10 mg Oral Daily Laverle Hobby, PA-C   10 mg at 11/30/15 [id number]  . magnesium hydroxide (MILK OF MAGNESIA) suspension 30 mL  30 mL Oral Daily PRN Laverle Hobby, PA-C      . metFORMIN (GLUCOPHAGE) tablet 500 mg  500 mg Oral BID WC Laverle Hobby, PA-C   500 mg at 11/30/15 1649  . metoprolol tartrate (LOPRESSOR) tablet 12.5 mg  12.5 mg Oral BID Laverle Hobby, PA-C   12.5 mg at 11/30/15 1650  . pantoprazole (PROTONIX) EC tablet 40 mg  40 mg Oral Daily Laverle Hobby, PA-C   40 mg at 11/30/15 [id number]  . potassium chloride SA (K-DUR,KLOR-CON) CR tablet 20 mEq  20 mEq Oral Daily Laverle Hobby, PA-C   20 mEq at 11/30/15 [id number]  . pregabalin (LYRICA) capsule 100 mg  100 mg Oral TID Laverle Hobby, PA-C   100 mg at 11/30/15 1649  . sertraline (ZOLOFT) tablet 150 mg  150 mg Oral Daily Norman Clay, MD   150 mg at 11/30/15 0813  . tamsulosin (FLOMAX) capsule 0.4 mg  0.4 mg Oral QPC breakfast Laverle Hobby, PA-C   0.4 mg at 11/30/15 [id number]    Lab Results:  Results for orders placed or performed during the hospital encounter of 11/22/15 (from the past 48 hour(s))  Glucose, capillary     Status: Abnormal   Collection Time: 11/29/15  6:06 AM  Result Value Ref Range   Glucose-Capillary 101 (H) 65 - 99 mg/dL    Blood Alcohol level:  Lab Results  Component Value Date   ETH <5 11/21/2015   ETH <5 [id number]    Metabolic Disorder Labs: Lab Results  Component Value Date   HGBA1C 5.3 11/14/2015   MPG 105 11/14/2015   MPG 123 10/24/2008   No results found for: PROLACTIN  Lab Results  Component Value Date   CHOL  04/17/2010    185        ATP III CLASSIFICATION:  <200     mg/dL   Desirable  200-239  mg/dL   Borderline High  >=240    mg/dL   High          TRIG 75 04/17/2010   HDL 44 04/17/2010   CHOLHDL 4.2 04/17/2010   VLDL 15 04/17/2010   LDLCALC (H) 04/17/2010    126        Total Cholesterol/HDL:CHD Risk Coronary Heart Disease Risk Table                     Men    Women  1/2 Average Risk   3.4   3.3  Average Risk       5.0   4.4  2 X Average Risk   9.6   7.1  3 X Average Risk  23.4   11.0        Use the calculated Patient Ratio above and the CHD Risk Table to determine the patient's CHD Risk.        ATP III CLASSIFICATION (LDL):  <100     mg/dL   Optimal  100-129  mg/dL   Near or Above                    Optimal  130-159  mg/dL   Borderline  160-189  mg/dL   High  >190     mg/dL   Very High   LDLCALC (H) 10/24/2008    116        Total Cholesterol/HDL:CHD Risk Coronary Heart Disease Risk Table                     Men   Women  1/2 Average Risk   3.4   3.3  Average Risk       5.0   4.4  2 X Average Risk   9.6   7.1  3 X Average Risk  23.4   11.0        Use the calculated Patient Ratio above and the CHD Risk Table to determine the patient's CHD Risk.        ATP III CLASSIFICATION (LDL):  <100     mg/dL   Optimal  100-129  mg/dL   Near or Above                    Optimal  130-159  mg/dL   Borderline  160-189  mg/dL   High  >190     mg/dL   Very High    Physical Findings: AIMS: Facial and Oral Movements Muscles of Facial Expression: None, normal Lips and Perioral Area: None, normal Jaw: None, normal Tongue: None, normal,Extremity Movements Upper (arms, wrists, hands, fingers): None, normal Lower (legs, knees, ankles, toes): None, normal, Trunk Movements Neck, shoulders, hips: None, normal, Overall Severity Severity of abnormal movements (highest score from questions above): None, normal Incapacitation due to abnormal movements: None, normal Patient's awareness of abnormal movements (rate only patient's report): No Awareness, Dental Status Current problems with teeth and/or dentures?: No Does patient usually wear dentures?: No  CIWA:    COWS:     Musculoskeletal: Strength & Muscle Tone: within normal limits Gait & Station: normal Patient leans: Right  Psychiatric Specialty Exam: Physical Exam  Nursing note and vitals  reviewed. Constitutional: He is oriented  to person, place, and time. He appears well-developed.  Cardiovascular: Normal rate.   Neurological: He is alert and oriented to person, place, and time.  Psychiatric: He has a normal mood and affect. His behavior is normal.  n   Review of Systems  Psychiatric/Behavioral: Positive for depression, substance abuse and suicidal ideas. The patient has insomnia.     Blood pressure 118/72, pulse 79, temperature 97.6 F (36.4 C), resp. rate 18, height 5\' 10"  (1.778 m), weight 103.5 kg (228 lb 2.8 oz), SpO2 100 %.Body mass index is 32.74 kg/m.  General Appearance: Casual and fairly groomed  Eye Contact:  Good  Speech:  Clear and Coherent  Volume:  Normal  Mood:  Depressed yet improving  Affect:  Congruent  Thought Process:  Coherent  Orientation:  Full (Time, Place, and Person)  Thought Content:  Headache, group meetings  Suicidal Thoughts:  Yes, without intent or plan patient is able to contract for safety  Homicidal Thoughts:  No  Memory:  Immediate;   Fair Recent;   Fair Remote;   Fair  Judgement:  Intact  Insight:  Lacking  Psychomotor Activity:  Restlessness  Concentration:  Concentration: Fair  Recall:  [place] of Knowledge:  Good  Language:  Good  Akathisia:  Yes  Handed:  Right  AIMS (if indicated):     Assets:  Communication Skills Desire for Improvement Social Support Transportation  ADL's:  Intact  Cognition:  WNL  Sleep:  Number of Hours: 5   Treatment Plan Summary: Cocaine dependence, continuous ([place]) with MDD, recurrent, moderate, managed as below:  I have reviewed and concur with treatment plan on 11/29/15 and will continue without changes as below:   -Continue Abilify 2 mg po for racing thoughts -Continue Zoloft 150mg  po daily for MDD150 -Continue Lyrica 100mg s PO tid for chronic pain -Continue home meds as on chart on 11/30/15 for non-psych concerns  Will continue to monitor vitals ,medication compliance and  treatment side effects while patient is here.  CSW will start working on disposition.  Patient to participate in therapeutic milieu  Benjamine Mola, FNP 11/30/2015, 5:45 PM   Agree with NP progress note as above

## 2015-11-30 NOTE — [facility] Group Notes (Addendum)

## 2015-11-30 NOTE — [facility] Group Notes (Signed)
Adult Therapy Group Note  Date: 11/30/2015 Time:  10:00-11:00AM  Group Topic/Focus: Healthy Air cabin crew Esteem:   The focus of this group was to assist patients in identifying current healthy supports, as well as how to widen their support systems.  Examples given by various patients were used to emphasize the importance of expanding supports, with an emphasis on the use of AA/NA including a sponsor and working the 12 steps, problem-specific support groups, doctors, counselors, and self.  There was also a discussion about how to reduce unhealthy supports, with several patients stating they were unwilling to cut certain people out of their lives, even if it means they will relapse.  A role play was performed to illustrate how unhealthy this is, and CSW pointed out that if patients have not yet decided to make a change in their life, that would impact their decision on whether to remove unhealthy sources of support or add healthy sources.   Participation Level:  None  Participation Quality:  Resistant  Affect:  Depressed  Cognitive:  N/A  Insight: Poor  Engagement in Group:  None  Modes of Intervention:  Discussion, Exploration and Support  Additional Comments:  When asked to name a  healthy support needed in order to live the life he wants, the patient stated "Pass" and very soon thereafter got up and walked out of group, not to return.  Selmer Dominion, LCSW 11/30/2015, 12:31 PM

## 2015-11-30 NOTE — Progress Notes (Signed)
D:  Patient's self inventory sheet, patient has poor sleep, sleep medication is not helpful.  Poor appetite, low energy level, poor concentration.  Rated depression, hopeless and anxiety 8.  Withdrawals, cramping.  SI, some, contracts for safety.  Physical problems, lightheaded, pain, dizzy, headaches, blurred vision.  Pain, lower back pain, no pain medication .  Goal is not trying to kill myself.  "Why can this end"  No discharge plans. A:  Medications administered per MD orders.  Emotional support and encouragement given patient. R:  SI, no plan, contracts for safety.  Denied HI.  Denied A/V hallucinations.  Safety maintained with 15 minute checks.

## 2015-11-30 NOTE — Progress Notes (Signed)
Patient did attend the evening speaker AA meeting.  

## 2015-11-30 NOTE — Plan of Care (Signed)
Problem: Education: Goal: Knowledge of disease or condition will improve Discussed coping skills related to avoiding relapse. Pt listened but offered no verbal indication of understanding.

## 2015-11-30 NOTE — Progress Notes (Signed)
Patient ID: Dalton [name], male   DOB: 01-25-1961, 55 y.o.   MRN: [id number]  D: Patient observed watching TV and interacting well with peers. Pt when talking writer appeared depressed and flat. Pt reports worried about post discharge plans. Pt stated if not placed into a good treatment facility he "will rather cut his neck off". Denies  SI/HI/AVH and pain and contracted to seek staff if thoughts gets worse.No behavioral issues noted.  A: Support and encouragement offered as needed. Medications administered as prescribed.  R: Patient cooperative and safe on unit. Pt attended and engage in evening AA group. Will continue to monitor patient for safety and stability.

## 2015-12-01 LAB — GLUCOSE, CAPILLARY
GLUCOSE-CAPILLARY: 99 mg/dL (ref 65–99)
GLUCOSE-CAPILLARY: 106 mg/dL — AB (ref 65–99)

## 2015-12-01 NOTE — Tx Team (Signed)
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 acetaminophen (TYLENOL) tablet 650 mg  650 mg Oral Q6H PRN Laverle Hobby, PA-C   650 mg at 11/30/15 0859  . albuterol (PROVENTIL HFA;VENTOLIN HFA) 108 (90 Base) MCG/ACT inhaler 2 puff  2 puff Inhalation Q6H PRN Jenne Campus, MD   2 puff at 11/30/15 0809  . alum & mag hydroxide-simeth (MAALOX/MYLANTA) 200-200-20 MG/5ML suspension 30 mL  30 mL Oral Q4H PRN Laverle Hobby, PA-C      . ARIPiprazole (ABILIFY) tablet 2 mg  2 mg Oral Daily Derrill Center, NP   2 mg at 12/01/15 0820  . atorvastatin (LIPITOR) tablet 10 mg  10 mg Oral q morning - 10a Laverle Hobby, PA-C   10 mg at 12/01/15 [id number]  . clopidogrel (PLAVIX) tablet 75 mg  75 mg Oral QAC breakfast Laverle Hobby, PA-C   75 mg at 12/01/15 [id number]  . fluticasone (FLONASE) 50 MCG/ACT nasal spray 2 spray  2 spray Each Nare Daily PRN Laverle Hobby, PA-C      . fluticasone (FLOVENT HFA) 44 MCG/ACT inhaler 2 puff  2 puff Inhalation BID Jenne Campus, MD   2 puff at 12/01/15 0820  . hydrochlorothiazide (HYDRODIURIL) tablet 25 mg  25 mg Oral Daily Laverle Hobby, PA-C   25 mg at 12/01/15 0820  . hydrOXYzine (ATARAX/VISTARIL) tablet 25 mg  25 mg Oral Q6H PRN Laverle Hobby, PA-C   25 mg at 11/30/15 0859  . hydrOXYzine (ATARAX/VISTARIL) tablet 50 mg  50 mg Oral QHS PRN Linard Millers, MD   50 mg at 11/30/15 2115  . ipratropium (ATROVENT) nebulizer solution 0.5 mg  0.5 mg Nebulization Q6H PRN Laverle Hobby,  PA-C   0.5 mg at 11/23/15 1558  . levETIRAcetam (KEPPRA) tablet 500 mg  500 mg Oral Q12H Laverle Hobby, PA-C   500 mg at 12/01/15 0820  . lidocaine (LIDODERM) 5 % 1 patch  1 patch Transdermal Daily Norman Clay, MD   1 patch at 12/01/15 [phone]  . lisinopril (PRINIVIL,ZESTRIL) tablet 2.5 mg  2.5 mg Oral Daily Laverle Hobby, PA-C   2.5 mg at 12/01/15 0820  . loratadine (CLARITIN) tablet 10 mg  10 mg Oral Daily Laverle Hobby, PA-C   10 mg at 12/01/15 0820  . magnesium hydroxide (MILK OF MAGNESIA) suspension 30 mL  30 mL Oral Daily PRN Laverle Hobby, PA-C      . metFORMIN (GLUCOPHAGE) tablet 500 mg  500 mg Oral BID WC Laverle Hobby, PA-C   500 mg at 12/01/15 0820  . metoprolol tartrate (LOPRESSOR) tablet 12.5 mg  12.5 mg Oral BID Laverle Hobby, PA-C   12.5 mg at 12/01/15 0820  . pantoprazole (PROTONIX) EC tablet 40 mg  40 mg Oral Daily Laverle Hobby, PA-C   40 mg at 12/01/15 0820  . potassium chloride SA (K-DUR,KLOR-CON) CR tablet 20 mEq  20 mEq Oral Daily Laverle Hobby, PA-C   20 mEq at 12/01/15 0820  . pregabalin (LYRICA) capsule 100 mg  100 mg Oral TID Frederico Hamman  E Simon, PA-C   100 mg at 12/01/15 [id number]  . sertraline (ZOLOFT) tablet 150 mg  150 mg Oral Daily Norman Clay, MD   150 mg at 12/01/15 0820  . tamsulosin (FLOMAX) capsule 0.4 mg  0.4 mg Oral QPC breakfast Laverle Hobby, PA-C   0.4 mg at 12/01/15 0820   PTA Medications: Prescriptions Prior to Admission  Medication Sig Dispense Refill Last Dose  . albuterol (PROAIR HFA) 108 (90 BASE) MCG/ACT inhaler Inhale 2 puffs into the lungs every 6 (six) hours as needed for wheezing (for shortness of breath).   Past Week at Unknown time  . albuterol (PROVENTIL) (2.5 MG/3ML) 0.083% nebulizer solution Take 3 mLs (2.5 mg total) by nebulization 2 (two) times daily. (Patient taking differently: Take 2.5 mg by nebulization 3 (three) times daily. ) 75 mL  11/21/2015 at am  . alprazolam (XANAX) 2 MG tablet Take 2 mg by mouth 2 (two) times daily.    11/21/2015 at am  . aspirin EC 81 MG tablet Take 1 tablet (81 mg total) by mouth daily. 30 tablet 0 11/21/2015 at am  . atorvastatin (LIPITOR) 10 MG tablet Take 1 tablet (10 mg total) by mouth every morning.   11/21/2015 at am  . beclomethasone (QVAR) 80 MCG/ACT inhaler Inhale 2 puffs into the lungs 2 (two) times daily. (Patient taking differently: Inhale 2 puffs into the lungs 2 (two) times daily as needed. ) 1 Inhaler 12 Past Week at Unknown time  . cetirizine (ZYRTEC) 10 MG tablet Take 10 mg by mouth daily as needed for allergies or rhinitis. Reported on 04/16/2015   UNK at [facility]  . clopidogrel (PLAVIX) 75 MG tablet Take 75 mg by mouth daily before breakfast.   11/21/2015 at am  . diclofenac sodium (VOLTAREN) 1 % GEL Place 1 application onto the skin 4 (four) times daily as needed.   Past Month at Unknown time  . doxycycline (VIBRA-TABS) 100 MG tablet Take 1 tablet (100 mg total) by mouth every 12 (twelve) hours. 20 tablet 0 11/21/2015 at Unknown time  . esomeprazole (NEXIUM) 40 MG capsule Take 1 capsule (40 mg total) by mouth 2 (two) times daily. (Patient not taking: Reported on 11/21/2015) 1 capsule 0 Not Taking at Unknown time  . fluticasone (FLONASE) 50 MCG/ACT nasal spray Place 2 sprays into both nostrils daily as needed for allergies.    Past Week at Unknown time  . furosemide (LASIX) 40 MG tablet Take 1 tablet (40 mg total) by mouth daily as needed for fluid or edema. 30 tablet 0 Past Week at Unknown time  . hydrochlorothiazide (HYDRODIURIL) 25 MG tablet Take 25 mg by mouth daily.  0 11/21/2015 at am  . hydrOXYzine (ATARAX/VISTARIL) 25 MG tablet Take 1 tablet (25 mg total) by mouth every 6 (six) hours. 12 tablet 0 Past Week at Unknown time  . ipratropium (ATROVENT) 0.02 % nebulizer solution Take 0.5 mg by nebulization every 6 (six) hours as needed for wheezing or shortness of breath.   Past Week at Unknown time  . lamoTRIgine (LAMICTAL) 200 MG tablet Take 1 tablet (200 mg total) by mouth daily. 30  tablet 0 11/21/2015 at am  . levETIRAcetam (KEPPRA) 500 MG tablet Take 1 tablet (500 mg total) by mouth every 12 (twelve) hours.   11/21/2015 at am  . metFORMIN (GLUCOPHAGE) 1000 MG tablet Take 0.5 tablets (500 mg total) by mouth daily with breakfast. (Patient taking differently: Take 500 mg by mouth 2 (two) times daily with a meal. )  11/21/2015 at am  . omeprazole (PRILOSEC) 20 MG capsule Take 20 mg by mouth 2 (two) times daily.  0 11/21/2015 at am  . oxyCODONE-acetaminophen (PERCOCET) 10-325 MG tablet Take 1 tablet by mouth every 6 (six) hours as needed for pain.   11/12/2015 at am  . potassium chloride SA (K-DUR,KLOR-CON) 20 MEQ tablet Take 1 tablet (20 mEq total) by mouth daily. 30 tablet 0 11/21/2015 at am  . pregabalin (LYRICA) 100 MG capsule Take 100-200 mg by mouth 3 (three) times daily as needed (for shooting pains in feet).    11/21/2015 at am  . sertraline (ZOLOFT) 100 MG tablet Take 1 tablet (100 mg total) by mouth daily. 1 tablet 0 11/21/2015 at am  . tamsulosin (FLOMAX) 0.4 MG CAPS capsule Take 1 capsule (0.4 mg total) by mouth daily after breakfast. 30 capsule  11/21/2015 at Unknown time  . [DISCONTINUED] lisinopril (ZESTRIL) 2.5 MG tablet Take 1 tablet (2.5 mg total) by mouth daily. 30 tablet 0 11/21/2015 at am  . [DISCONTINUED] metoprolol tartrate (LOPRESSOR) 25 MG tablet Take 0.5 tablets (12.5 mg total) by mouth 2 (two) times daily.   11/21/2015 at 1400    Patient Stressors: Financial difficulties Loss of support; moving locations recently Substance abuse  Patient Strengths: Ability for insight Average or above average intelligence Communication skills General fund of knowledge  Treatment Modalities: Medication Management, Group therapy, Case management,  1 to 1 session with clinician, Psychoeducation, Recreational therapy.   Physician Treatment Plan for Primary Diagnosis: Cocaine dependence, continuous ([place]) Long Term Goal(s): Improvement in symptoms so as ready for  discharge Improvement in symptoms so as ready for discharge   Short Term Goals: Ability to identify changes in lifestyle to reduce recurrence of condition will improve Ability to demonstrate self-control will improve Ability to identify changes in lifestyle to reduce recurrence of condition will improve Ability to identify and develop effective coping behaviors will improve  Medication Management: Evaluate patient's response, side effects, and tolerance of medication regimen.  Therapeutic Interventions: 1 to 1 sessions, Unit Group sessions and Medication administration.  Evaluation of Outcomes: Progressing  Physician Treatment Plan for Secondary Diagnosis: Principal Problem:   Cocaine dependence, continuous ([place]) Active Problems:   Psychoactive substance-induced mood disorder (HCC)   MDD (major depressive disorder), recurrent episode, moderate ([place])  Long Term Goal(s): Improvement in symptoms so as ready for discharge Improvement in symptoms so as ready for discharge   Short Term Goals: Ability to identify changes in lifestyle to reduce recurrence of condition will improve Ability to demonstrate self-control will improve Ability to identify changes in lifestyle to reduce recurrence of condition will improve Ability to identify and develop effective coping behaviors will improve     Medication Management: Evaluate patient's response, side effects, and tolerance of medication regimen.  Therapeutic Interventions: 1 to 1 sessions, Unit Group sessions and Medication administration.  Evaluation of Outcomes: Progressing   RN Treatment Plan for Primary Diagnosis: Cocaine dependence, continuous ([place]) Long Term Goal(s): Knowledge of disease and therapeutic regimen to maintain health will improve  Short Term Goals: Ability to remain free from injury will improve, Ability to disclose and discuss suicidal ideas, Ability to identify and develop effective coping behaviors will improve and  Compliance with prescribed medications will improve  Medication Management: RN will administer medications as ordered by provider, will assess and evaluate patient's response and provide education to patient for prescribed medication. RN will report any adverse and/or side effects to prescribing provider.  Therapeutic Interventions: 1 on 1  counseling sessions, Psychoeducation, Medication administration, Evaluate responses to treatment, Monitor vital signs and CBGs as ordered, Perform/monitor CIWA, COWS, AIMS and Fall Risk screenings as ordered, Perform wound care treatments as ordered.  Evaluation of Outcomes: Progressing   LCSW Treatment Plan for Primary Diagnosis: Cocaine dependence, continuous ([place]) Long Term Goal(s): Safe transition to appropriate next level of care at discharge, Engage patient in therapeutic group addressing interpersonal concerns.  Short Term Goals: Engage patient in aftercare planning with referrals and resources, Increase social support, Increase emotional regulation, Identify triggers associated with mental health/substance abuse issues and Increase skills for wellness and recovery  Therapeutic Interventions: Assess for all discharge needs, 1 to 1 time with Social worker, Explore available resources and support systems, Assess for adequacy in community support network, Educate family and significant other(s) on suicide prevention, Complete Psychosocial Assessment, Interpersonal group therapy.  Evaluation of Outcomes: Progressing   Progress in Treatment :  Attending groups: Intermittently  Participating in groups: Yes, when he attends  Taking medication as prescribed: Yes,   Toleration medication: Yes  Family/Significant other contact made: CSW spoke w son who is not supportive  Patient understands diagnosis: Yes  Discussing patient identified problems/goals with staff: Yes  Medical problems stabilized or resolved: Yes  Denies suicidal/homicidal ideation:  Treatment team continuing to asses  Issues/concerns per patient self-inventory: None reported  Other: N/A  New problem(s) identified: None reported at this time    New Short Term/Long Term Goal(s): None at this time    Discharge Plan or Barriers: Patient is homeless and denies any social supports. He is interested in residential treatment. 10/9:  Has been referred to multiple providers, accepted at [facility] in [place], pt declined this referral. Wants to go to [facility] which has no beds.  States he does not want to go to [facility], has been declined at various treatment facilities per [employer].  Requested referral to [facility].  REferral will be made today.     Reason for Continuation of Hospitalization: Anxiety Depression Medication stabilization Suicidal Ideations Withdrawal symptoms  Estimated Length of Stay: 2-3 days    Attendees:  Patient:  Physician: Dr. Sharolyn Douglas, MD 10/9/20179:30am  Nursing: Marnee Guarneri, Loletta Specter, RN; Gaylan Gerold RN10/9/20179:30am  RN Care Manager:   Social Workers: Eusebio Me, LCSW 10/9/20179:30am  Nurse Pratictioners: Samuel Jester, NP, Ricky Ala, NP 10/9/20179:30am  Other:10/2/20179:30am   Scribe for Treatment Team: Edwyna Shell, [place] Social Worker Phone:  [phone]

## 2015-12-01 NOTE — Progress Notes (Signed)
[facility] MD Progress Note  12/01/2015 2:25 PM  Patient Active Problem List   Diagnosis Date Noted  . MDD (major depressive disorder), recurrent episode, moderate ([place]) 11/30/2015  . Cocaine dependence, continuous ([place]) 11/27/2015  . 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 Chest pain 11/12/2015  . Type 2 diabetes mellitus with vascular disease ([place]) 11/12/2015  . History of seizure 11/12/2015  . Cocaine abuse 01/26/2015  . Cocaine-induced mood disorder ([place]) 01/26/2015  . Helicobacter pylori gastritis 05/30/2012  . Pure hypercholesterolemia 08/31/2011  . Essential hypertension, benign 08/31/2011  . Postsurgical aortocoronary bypass status 08/31/2011  . PAD (peripheral artery disease) ([place]) 08/31/2011  . GERD (gastroesophageal reflux disease) 12/07/2010  . Esophageal dysphagia 12/07/2010  . Constipation 12/07/2010  . Laryngopharyngeal reflux (LPR) 12/07/2010  . Lumbar pain with radiation down left leg 11/17/2010  . CARPAL TUNNEL SYNDROME 07/14/2009  . SHOULDER IMPINGEMENT SYNDROME, LEFT 05/05/2009    Diagnosis: Cocaine use disorder, severe  Subjective: [name] still endorses that he would not feel safe if released he reports that he spoke with the doctor over the weekend and "he give me something" but states so far he doesn't feel it's helping and it's given him a headache. He reports at times he is "really strong suicidal ideation" and "thinks of ways" that he could harm himself he does not endorse currently planning to harm himself or others at this moment. He still expresses disappointment with the options for treatment that are available to him and states he still feels that if he presents with enough suicidal ideation we will be able to provide him with a program that he feels meets his needs.  Objective: Well developed well nourished man in no apparent distress who is pleasant and appropriate. Presents with a somewhat irritable and anxious affect, and  describes his mood as being only so-so. He endorses that he does have intermittent strong suicidal ideation with Friday of thoughts about how he would harm himself although he denies any immediate intent to act. Denies homicidal ideation or psychotic symptoms. Alert and oriented 3 insight is limited and judgment is fair IQ appears an average range   Current Facility-Administered Medications (Endocrine & Metabolic):  .  metFORMIN (GLUCOPHAGE) tablet 500 mg  Current Outpatient Prescriptions (Endocrine & Metabolic):  .  metFORMIN (GLUCOPHAGE) 500 MG tablet, Take 1 tablet (500 mg total) by mouth 2 (two) times daily with a meal.  Current Facility-Administered Medications (Cardiovascular):  .  atorvastatin (LIPITOR) tablet 10 mg .  hydrochlorothiazide (HYDRODIURIL) tablet 25 mg .  lisinopril (PRINIVIL,ZESTRIL) tablet 2.5 mg .  metoprolol tartrate (LOPRESSOR) tablet 12.5 mg  Current Outpatient Prescriptions (Cardiovascular):  .  atorvastatin (LIPITOR) 10 MG tablet, Take 1 tablet (10 mg total) by mouth every morning. .  hydrochlorothiazide (HYDRODIURIL) 25 MG tablet, Take 1 tablet (25 mg total) by mouth daily. [name] Kitchen  lisinopril (ZESTRIL) 2.5 MG tablet, Take 1 tablet (2.5 mg total) by mouth daily. .  metoprolol tartrate (LOPRESSOR) 25 MG tablet, Take 0.5 tablets (12.5 mg total) by mouth 2 (two) times daily.  Current Facility-Administered Medications (Respiratory):  .  albuterol (PROVENTIL HFA;VENTOLIN HFA) 108 (90 Base) MCG/ACT inhaler 2 puff .  fluticasone (FLONASE) 50 MCG/ACT nasal spray 2 spray .  fluticasone (FLOVENT HFA) 44 MCG/ACT inhaler 2 puff .  ipratropium (ATROVENT) nebulizer solution 0.5 mg .  loratadine (CLARITIN) tablet 10 mg   Current Facility-Administered Medications (Analgesics):  .  acetaminophen (TYLENOL) tablet 650 mg   Current Facility-Administered Medications (Hematological):  [name] Kitchen  clopidogrel (PLAVIX) tablet 75 mg  Current Outpatient Prescriptions (Hematological):  .   clopidogrel (PLAVIX) 75 MG tablet, Take 1 tablet (75 mg total) by mouth daily before breakfast.  Current Facility-Administered Medications (Other):  .  alum & mag hydroxide-simeth (MAALOX/MYLANTA) 200-200-20 MG/5ML suspension 30 mL .  ARIPiprazole (ABILIFY) tablet 2 mg .  hydrOXYzine (ATARAX/VISTARIL) tablet 25 mg .  hydrOXYzine (ATARAX/VISTARIL) tablet 50 mg .  levETIRAcetam (KEPPRA) tablet 500 mg .  lidocaine (LIDODERM) 5 % 1 patch .  magnesium hydroxide (MILK OF MAGNESIA) suspension 30 mL .  pantoprazole (PROTONIX) EC tablet 40 mg .  potassium chloride SA (K-DUR,KLOR-CON) CR tablet 20 mEq .  pregabalin (LYRICA) capsule 100 mg .  sertraline (ZOLOFT) tablet 150 mg .  tamsulosin (FLOMAX) capsule 0.4 mg  Current Outpatient Prescriptions (Other):  .  hydrOXYzine (ATARAX/VISTARIL) 25 MG tablet, Take 1 tablet (25 mg total) by mouth every 6 (six) hours as needed for anxiety. .  levETIRAcetam (KEPPRA) 500 MG tablet, Take 1 tablet (500 mg total) by mouth every 12 (twelve) hours. .  pantoprazole (PROTONIX) 40 MG tablet, Take 1 tablet (40 mg total) by mouth daily. .  potassium chloride SA (K-DUR,KLOR-CON) 20 MEQ tablet, Take 1 tablet (20 mEq total) by mouth daily. .  pregabalin (LYRICA) 100 MG capsule, Take 1 capsule (100 mg total) by mouth 3 (three) times daily. .  sertraline (ZOLOFT) 50 MG tablet, Take 3 tablets (150 mg total) by mouth daily. .  tamsulosin (FLOMAX) 0.4 MG CAPS capsule, Take 1 capsule (0.4 mg total) by mouth daily after breakfast.  Vital Signs:Blood pressure 106/72, pulse 89, temperature 98.5 F (36.9 C), temperature source Oral, resp. rate 20, height 5\' 10"  (1.778 m), weight 103.5 kg (228 lb 2.8 oz), SpO2 100 %.    Lab Results:  Results for orders placed or performed during the hospital encounter of 11/22/15 (from the past 48 hour(s))  Glucose, capillary     Status: None   Collection Time: 11/30/15  5:56 AM  Result Value Ref Range   Glucose-Capillary 99 65 - 99 mg/dL    Comment 1 Notify RN   Glucose, capillary     Status: Abnormal   Collection Time: 12/01/15  6:23 AM  Result Value Ref Range   Glucose-Capillary 106 (H) 65 - 99 mg/dL    Physical Findings: AIMS: Facial and Oral Movements Muscles of Facial Expression: None, normal Lips and Perioral Area: None, normal Jaw: None, normal Tongue: None, normal,Extremity Movements Upper (arms, wrists, hands, fingers): None, normal Lower (legs, knees, ankles, toes): None, normal, Trunk Movements Neck, shoulders, hips: None, normal, Overall Severity Severity of abnormal movements (highest score from questions above): None, normal Incapacitation due to abnormal movements: None, normal Patient's awareness of abnormal movements (rate only patient's report): No Awareness, Dental Status Current problems with teeth and/or dentures?: No Does patient usually wear dentures?: No  CIWA:  CIWA-Ar Total: 1 COWS:  COWS Total Score: 1   Assessment/Plan: The patient continues to present with limited tolerance for feelings of rejection. It appears that this may be working with his anxiety about recovery to produce some strong emotions around placement. 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 We will continue to work with patient to try to improve his coping skills and his acceptance of what is available for him in the community.  He was started on small dose of aripiprazole over the weekend by the covering M.D. and currently he does not find it helpful but we will continue to monitor this for a few more days before making any more changes.  Linard Millers, MD 12/01/2015, 2:25 PM

## 2015-12-01 NOTE — Progress Notes (Signed)
[facility] Group Notes:  (Nursing/MHT/Case Management/Adjunct)  Date:  12/01/2015  Time:  11:35 PM  Type of Therapy:  Psychoeducational Skills  Participation Level:  Minimal  Participation Quality:  Resistant  Affect:  Flat  Cognitive:  Appropriate  Insight:  Lacking  Engagement in Group:  Resistant  Modes of Intervention:  Education  Summary of Progress/Problems: Patient described his day as having been "decent". The patient provided few details except to say that he is still waiting on placement and is unhappy at this time. In terms of the theme for the day, his wellness strategy will be to take his medication as prescribed.   Dalton [name] 12/01/2015, 11:35 PM

## 2015-12-01 NOTE — Plan of Care (Signed)
Problem: Safety: Goal: Ability to remain free from injury will improve Outcome: Progressing Pt is safe and free from injury this shift

## 2015-12-01 NOTE — [facility] Group Notes (Signed)
[facility] LCSW Aftercare Discharge Planning Group Note   12/01/2015 9:24 AM  Participation Quality:  lmited  Plan for Discharge/Comments:  Turned down [facility] placement on 10/6; now wants [facility], declined [facility].  States he is trying to call [employer] but "that phone doesn't let me."  Transportation Means: unknown  Supports: may have friends in community, but unclear level of [place]

## 2015-12-01 NOTE — Progress Notes (Signed)
Patient ID: Dalton [name], male   DOB: 1960-10-10, 55 y.o.   MRN: [id number]  DAR: Pt. Denies HI and A/V Hallucinations. He reports passive SI but is able to contract for safety.  He reports sleep is fair, appetite is fair, energy level is normal, and concentration is poor. He rates depression 8/10, hopelessness 9/10, and anxiety 9/10. Patient received PRN Vistaril for anxiety which provided some relief. Patient is irritable this morning but does appear more approachable as the day goes on. Patient asked writer if a pill was available to end someone's suffering, help them die and it would not be breaking a law would Probation officer administer it. Patient appears to ruminate on negative feelings and thoughts, stating, "nothing will help my pain." Support and encouragement provided to the patient. Scheduled medications administered to patient per physician's orders. Patient is minimal with Probation officer but does interact with peers. It is possible that patient was receiving medication from another patient whom has since discharged. He was seen approaching this patient in the dayroom after this patient received pain medication and said, "do you have it?"  MD Sharolyn Douglas was notified of this. Q15 minute checks are maintained for safety.

## 2015-12-01 NOTE — Progress Notes (Signed)
Recreation Therapy Notes  Date: 12/01/15 Time: 0930 Location: [address] Group Room  Group Topic: Stress Management  Goal Area(s) Addresses:  Patient will verbalize importance of using healthy stress management.  Patient will identify positive emotions associated with healthy stress management.   Behavioral Response: Engaged  Intervention: Guided Imagery  Activity :  Depression Imagery.  LRT introduced the technique of guided imagery to the patients.  LRT read a script allowing patients to participate in the activity.  Patients were to follow along as LRT read script.  Education:  Stress Management, Discharge Planning.   Education Outcome: Acknowledges edcuation/In group clarification offered/Needs additional education  Clinical Observations/Feedback: Pt attended group.   Victorino Sparrow, LRT/CTRS         Victorino Sparrow A 12/01/2015 11:45 AM

## 2015-12-02 LAB — GLUCOSE, CAPILLARY: Glucose-Capillary: 95 mg/dL (ref 65–99)

## 2015-12-02 MED ORDER — HYDROXYZINE HCL 50 MG PO TABS
50.0000 mg | ORAL_TABLET | Freq: Every evening | ORAL | Status: AC | PRN
  Administered 2015-12-02 – 2015-12-06 (×5): 50 mg via ORAL
  Filled 2015-12-02 (×5): qty 1

## 2015-12-02 NOTE — Progress Notes (Addendum)
[facility] MD Progress Note  12/02/2015 11:13 AM  Patient Active Problem List   Diagnosis Date Noted  . MDD (major depressive disorder), recurrent episode, moderate ([place]) 11/30/2015  . Cocaine dependence, continuous ([place]) 11/27/2015  . Psychoactive substance-induced mood disorder ([place]) 11/22/2015  . Chest pain 11/12/2015  . Type 2 diabetes mellitus with vascular disease ([place]) 11/12/2015  . History of seizure 11/12/2015  . Cocaine abuse 01/26/2015  . Cocaine-induced mood disorder ([place]) 01/26/2015  . Helicobacter pylori gastritis 05/30/2012  . Pure hypercholesterolemia 08/31/2011  . Essential hypertension, benign 08/31/2011  . Postsurgical aortocoronary bypass status 08/31/2011  . PAD (peripheral artery disease) ([place]) 08/31/2011  . GERD (gastroesophageal reflux disease) 12/07/2010  . Esophageal dysphagia 12/07/2010  . Constipation 12/07/2010  . Laryngopharyngeal reflux (LPR) 12/07/2010  . Lumbar pain with radiation down left leg 11/17/2010  . CARPAL TUNNEL SYNDROME 07/14/2009  . SHOULDER IMPINGEMENT SYNDROME, LEFT 05/05/2009    Diagnosis: Cocaine use disorder severe, depression  Subjective: Mr. Trude reports that he does not find the aripiprazole he was tried on for "thoughts" this weekend to be helpful and it is giving him a headache. He has been attempting to contact the [facility] rescue mission but has not had any luck in getting through. He states that if he is unable to get anything else "I'll try that lady again" i.e. give ready for change a chance. His mood is only so-so but he does not endorse any acute plans to harm himself or others at this point.  Objective: Well developed well nourished man in no apparent distress pleasant and appropriate but a bit irritable and dysphoric in affect and mood is congruent speech and motor appeared within normal limits thought processes are linear and goal-directed thought content denies acute suicidal or homicidal ideation, plan or intent alert  and oriented 3 insight and judgment are fair IQ appears an average range   Current Facility-Administered Medications (Endocrine & Metabolic):  .  metFORMIN (GLUCOPHAGE) tablet 500 mg  Current Outpatient Prescriptions (Endocrine & Metabolic):  .  metFORMIN (GLUCOPHAGE) 500 MG tablet, Take 1 tablet (500 mg total) by mouth 2 (two) times daily with a meal.  Current Facility-Administered Medications (Cardiovascular):  .  atorvastatin (LIPITOR) tablet 10 mg .  hydrochlorothiazide (HYDRODIURIL) tablet 25 mg .  lisinopril (PRINIVIL,ZESTRIL) tablet 2.5 mg .  metoprolol tartrate (LOPRESSOR) tablet 12.5 mg  Current Outpatient Prescriptions (Cardiovascular):  .  atorvastatin (LIPITOR) 10 MG tablet, Take 1 tablet (10 mg total) by mouth every morning. .  hydrochlorothiazide (HYDRODIURIL) 25 MG tablet, Take 1 tablet (25 mg total) by mouth daily. [name] Kitchen  lisinopril (ZESTRIL) 2.5 MG tablet, Take 1 tablet (2.5 mg total) by mouth daily. .  metoprolol tartrate (LOPRESSOR) 25 MG tablet, Take 0.5 tablets (12.5 mg total) by mouth 2 (two) times daily.  Current Facility-Administered Medications (Respiratory):  .  albuterol (PROVENTIL HFA;VENTOLIN HFA) 108 (90 Base) MCG/ACT inhaler 2 puff .  fluticasone (FLONASE) 50 MCG/ACT nasal spray 2 spray .  fluticasone (FLOVENT HFA) 44 MCG/ACT inhaler 2 puff .  ipratropium (ATROVENT) nebulizer solution 0.5 mg .  loratadine (CLARITIN) tablet 10 mg   Current Facility-Administered Medications (Analgesics):  .  acetaminophen (TYLENOL) tablet 650 mg   Current Facility-Administered Medications (Hematological):  .  clopidogrel (PLAVIX) tablet 75 mg  Current Outpatient Prescriptions (Hematological):  .  clopidogrel (PLAVIX) 75 MG tablet, Take 1 tablet (75 mg total) by mouth daily before breakfast.  Current Facility-Administered Medications (Other):  .  alum & mag hydroxide-simeth (MAALOX/MYLANTA) 200-200-20 MG/5ML suspension  30 mL .  hydrOXYzine (ATARAX/VISTARIL) tablet 25  mg .  hydrOXYzine (ATARAX/VISTARIL) tablet 50 mg .  levETIRAcetam (KEPPRA) tablet 500 mg .  lidocaine (LIDODERM) 5 % 1 patch .  magnesium hydroxide (MILK OF MAGNESIA) suspension 30 mL .  pantoprazole (PROTONIX) EC tablet 40 mg .  potassium chloride SA (K-DUR,KLOR-CON) CR tablet 20 mEq .  pregabalin (LYRICA) capsule 100 mg .  sertraline (ZOLOFT) tablet 150 mg .  tamsulosin (FLOMAX) capsule 0.4 mg  Current Outpatient Prescriptions (Other):  .  hydrOXYzine (ATARAX/VISTARIL) 25 MG tablet, Take 1 tablet (25 mg total) by mouth every 6 (six) hours as needed for anxiety. .  levETIRAcetam (KEPPRA) 500 MG tablet, Take 1 tablet (500 mg total) by mouth every 12 (twelve) hours. .  pantoprazole (PROTONIX) 40 MG tablet, Take 1 tablet (40 mg total) by mouth daily. .  potassium chloride SA (K-DUR,KLOR-CON) 20 MEQ tablet, Take 1 tablet (20 mEq total) by mouth daily. .  pregabalin (LYRICA) 100 MG capsule, Take 1 capsule (100 mg total) by mouth 3 (three) times daily. .  sertraline (ZOLOFT) 50 MG tablet, Take 3 tablets (150 mg total) by mouth daily. .  tamsulosin (FLOMAX) 0.4 MG CAPS capsule, Take 1 capsule (0.4 mg total) by mouth daily after breakfast.  Vital Signs:Blood pressure 113/65, pulse 76, temperature 98.5 F (36.9 C), temperature source Oral, resp. rate 18, height 5\' 10"  (1.778 m), weight 103.5 kg (228 lb 2.8 oz), SpO2 100 %.    Lab Results:  Results for orders placed or performed during the hospital encounter of 11/22/15 (from the past 48 hour(s))  Glucose, capillary     Status: Abnormal   Collection Time: 12/01/15  6:23 AM  Result Value Ref Range   Glucose-Capillary 106 (H) 65 - 99 mg/dL  Glucose, capillary     Status: None   Collection Time: 12/02/15  6:15 AM  Result Value Ref Range   Glucose-Capillary 95 65 - 99 mg/dL    Physical Findings: AIMS: Facial and Oral Movements Muscles of Facial Expression: None, normal Lips and Perioral Area: None, normal Jaw: None, normal Tongue: None,  normal,Extremity Movements Upper (arms, wrists, hands, fingers): None, normal Lower (legs, knees, ankles, toes): None, normal, Trunk Movements Neck, shoulders, hips: None, normal, Overall Severity Severity of abnormal movements (highest score from questions above): None, normal Incapacitation due to abnormal movements: None, normal Patient's awareness of abnormal movements (rate only patient's report): No Awareness, Dental Status Current problems with teeth and/or dentures?: No Does patient usually wear dentures?: No  CIWA:  CIWA-Ar Total: 1 COWS:  COWS Total Score: 1   Assessment/Plan: Patient reports aripiprazole unhelpful and giving him headaches and will be discontinued. Mr. Ruda appears to be accepting more the realities of his placement issues and does mention possibly being willing to go to a program that previously he decided not suit him. Today he makes no mention of wanting to harm himself if released. Discussed his case with social worker and the plan is to have them sit down together to contact some agencies such as [facility] or the [employer] in [place] and then go forward from there with plans for placement.  Linard Millers, MD 12/02/2015, 11:13 AM

## 2015-12-02 NOTE — BHH Group Notes (Signed)
[facility] Group Notes:  (Nursing/MHT/Case Management/Adjunct)  Date:  12/02/2015  Time:  9:48 AM Type of Therapy:  Psychoeducational Skills  Participation Level:  Did Not Attend  Participation Quality:  DID NOT ATTEND  Affect:  DID NOT ATTEND  Cognitive:  DID NOT ATTEND  Insight:  None  Engagement in Group:  DID NOT ATTEND  Modes of Intervention:  DID NOT ATTEND  Summary of Progress/Problems: Pt did not attend patient self inventory group.   Wolfgang Phoenix 12/02/2015, 9:48 AM

## 2015-12-02 NOTE — Progress Notes (Signed)
D    Pt continues to be depressed and sad over his living situation and the fact that he wants to be in a safe place away from drugs   Pt reports not finding anything he is receptive to A    Verbal support given   Medications administered and effectiveness monitored    Q 15 min checks R   Pt is safe at present and receptive to verbal support

## 2015-12-02 NOTE — Progress Notes (Signed)
Recreation Therapy Notes  Animal-Assisted Activity (AAA) Program Checklist/Progress Notes Patient Eligibility Criteria Checklist & Daily Group note for Rec TxIntervention  Date: 10.10.2017 Time: 3:00pm Location: [address]    AAA/T Program Assumption of Risk Form signed by Patient/ or Parent Legal Guardian Yes  Patient is free of allergies or sever asthma Yes  Patient reports no fear of animals Yes  Patient reports no history of cruelty to animals Yes  Patient understands his/her participation is voluntary Yes  Patient washes hands before animal contact Yes  Patient washes hands after animal contact Yes  Behavioral Response: Engaged, Appropriate   Education:Hand Washing, Appropriate Animal Interaction   Education Outcome: Acknowledges education.   Clinical Observations/Feedback: Patient attended session and interacted appropriately with therapy dog and peers.    Laureen Ochs Daeron Carreno, LRT/CTRS        Dannetta Lekas L 12/02/2015 3:05 PM

## 2015-12-02 NOTE — Progress Notes (Signed)
Patient attended AA group meeting tonight.  

## 2015-12-02 NOTE — Progress Notes (Signed)
LCSW is actively working on step down program for patient.   LCSW presented options:  [facility]House   Patient agreeable to the above and LCSW is actively working on referrals.  Lane Hacker, MSW Clinical Social Work: [employer] Coverage for :  [employer]

## 2015-12-02 NOTE — Progress Notes (Signed)
D: Pt continues to be very flat and depressed on the unit today. Pt also continues to be very isolative and has been in bed most of the day except for medications nad meals. Pt reported that his depression was a 8, his hopelessness was a 7, and that his anxiety was a 8. Pt reported being negative HI, but positive for passive SI with no plan, no AH/VH noted and verbally contracted for safety. A: 15 min checks continued for patient safety. R: Pts safety maintained.

## 2015-12-02 NOTE — BHH Group Notes (Signed)
Type of Therapy: Mental Health Association Presentation  Participation Level: Active  Participation Quality: Attentive  Affect: Appropriate  Cognitive: Oriented  Insight: Developing/Improving  Engagement in Therapy: Engaged  Modes of Intervention: Discussion, Education and Socialization  Summary of Progress/Problems: Mental Health Association (MHA) Speaker came to talk about his personal journey with substance abuse and addiction. The pt processed ways by which to relate to the speaker. MHA speaker provided handouts and educational information pertaining to groups and services offered by the MHA. Pt was engaged in speaker's presentation and was receptive to resources provided.  Haris Baack LCSW, MSW Clinical Social Work: [employer]  

## 2015-12-02 NOTE — Progress Notes (Signed)
D: Pt is alert an oriented x4. Pt at the time of assessment endorsed moderate anxiety, depression and passive SI; states, "I have tried to get into a long term treatment but they keep rejecting my insurance; I am tired of all these and will just be easier to just go" Pt contracts for safety. Pt also endorse moderate to severe chronic back pain. Pt is flat and withdrawn to self even while at the dayroom. Pt remained calm and cooperative. A: Medications offered as prescribed.  Support, encouragement, and safe environment provided.  15-minute safety checks continue. R: Pt was med compliant.  Pt attended wrap-up group. Safety checks continue.

## 2015-12-03 LAB — GLUCOSE, CAPILLARY: Glucose-Capillary: 106 mg/dL — ABNORMAL HIGH (ref 65–99)

## 2015-12-03 MED ORDER — TUBERCULIN PPD 5 UNIT/0.1ML ID SOLN
5.0000 [IU] | Freq: Once | INTRADERMAL | Status: AC
  Administered 2015-12-03: 5 [IU] via INTRADERMAL

## 2015-12-03 MED ORDER — RISPERIDONE 2 MG PO TBDP
2.0000 mg | ORAL_TABLET | Freq: Every day | ORAL | Status: DC
  Administered 2015-12-04: 2 mg via ORAL
  Filled 2015-12-03 (×3): qty 1

## 2015-12-03 NOTE — BHH Group Notes (Signed)
[facility] LCSW Group Therapy  12/03/2015 1:47 PM  Type of Therapy:  Group Therapy  Participation Level:  Did Not Attend  Patient in room, sleeping in bed, covers over his head.  Declined to attend.    Lilly Cove 12/03/2015, 1:47 PM

## 2015-12-03 NOTE — Progress Notes (Signed)
[facility] Group Notes:  (Nursing/MHT/Case Management/Adjunct)  Date:  12/03/2015  Time:  10:55 AM  Type of Therapy:  Nurse Education  Participation Level:  Active  Participation Quality:  Appropriate  Affect:  Appropriate  Cognitive:  Alert and Oriented  Insight:  Appropriate  Engagement in Group:  Engaged  Modes of Intervention:  Activity, Clarification, Discussion, Education, Socialization and Support  Summary of Progress/Problems:The pupose of this group is to educate and introduce patients to the benefits of aromatherapy. Assessment completed prior to group. Discussed aromatherapy treatment to enhance and support patient's treatment goals. Pt requested headache, sleep and peace blend.  Mosie Lukes 12/03/2015, 10:55 AM

## 2015-12-03 NOTE — Progress Notes (Signed)
Patient did not attend NA group meeting tonight.  

## 2015-12-03 NOTE — Progress Notes (Signed)
Pt assessed and given cotton balls with a drop of peace blend and lavender to use for sleep tonight.
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
## 2015-12-03 NOTE — Progress Notes (Signed)
DAR NOTE: Pt present with flat affect and depressed mood in the unit. Pt has been isolating himself and has been bed most of the time. Pt denies physical pain, took all his meds as scheduled. As per self inventory, pt had a good night sleep, fair appetite, normal energy, and good concentration. Pt rate depression at 7, hopeless ness at 7, and anxiety at 7. Pt's safety ensured with 15 minute and environmental checks. Pt currently denies SI/HI and A/V hallucinations. Pt verbally agrees to seek staff if SI/HI or A/VH occurs and to consult with staff before acting on these thoughts. Will continue POC.
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
## 2015-12-03 NOTE — Progress Notes (Signed)
Patient has a planned face to face tomorrow with social worker from [facility] at 10:00 am.  Patient will meet with SW on unit at [facility].   Delana Meyer can be reached at [facility] will review with patient the facility and information regarding expectations.  Lead SW: Webb Silversmith was made aware of plan. Need for TB test is also required.  LCSW will discuss with treatment team to complete TB screen.  Will follow up once face to face is complete.  Lane Hacker, MSW Clinical Social Work: [employer]

## 2015-12-03 NOTE — Progress Notes (Signed)
TB test placed on the left arm on 12/03/15 at 1708. Need to read in 48h rs. Pt tolerated well.

## 2015-12-03 NOTE — Progress Notes (Signed)
D   Pt refused bedtime medications and did not attend group    He said he did not want to talk with staff and we were not helping him any    According to staff pt is upset over his pending discharge in the morning   He has isolated to his room A    Verbal support given    Medications offered    Q 15 min checks     R   Pt safe at present

## 2015-12-03 NOTE — Progress Notes (Signed)
Recreation Therapy Notes  Date: 12/03/15 Time: 0930 Location: [address] Dayroom  Group Topic: Stress Management  Goal Area(s) Addresses:  Patient will verbalize importance of using healthy stress management.  Patient will identify positive emotions associated with healthy stress management.   Intervention: Stress Management  Activity :  Progressive Muscle Relaxation.  LRT introduced the technique of progressive muscle relaxation.  LRT explained that the technique allows patients to relax muscle groups one at a time.  LRT read a script to guide the patients through the technique.  Pt were to follow along as LRT read script to engage in technique.  Education:  Stress Management, Discharge Planning.   Education Outcome: Acknowledges edcuation/In group clarification offered/Needs additional education  Clinical Observations/Feedback: Pt did not attend group.    Victorino Sparrow, LRT/CTRS         Victorino Sparrow A 12/03/2015 12:57 PM

## 2015-12-03 NOTE — Progress Notes (Signed)
Pt refused his 1700 medications, pt stated he is taking too much medications which is not helping him.

## 2015-12-03 NOTE — Plan of Care (Signed)
Problem: Activity: Goal: Sleeping patterns will improve Outcome: Progressing Pt sleeping atleast 6 hours per night  Problem: Safety: Goal: Periods of time without injury will increase Outcome: Progressing Pt has had no self injurious behaviors

## 2015-12-04 LAB — GLUCOSE, CAPILLARY: GLUCOSE-CAPILLARY: 102 mg/dL — AB (ref 65–99)

## 2015-12-04 MED ORDER — LORAZEPAM 1 MG PO TABS
2.0000 mg | ORAL_TABLET | Freq: Once | ORAL | Status: AC | PRN
  Administered 2015-12-09: 2 mg via ORAL
  Filled 2015-12-04: qty 2
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
## 2015-12-04 NOTE — Clinical Social Work Note (Signed)
Patient had interview w [facility] says patient must have Tb test read prior to facility making admission decision, also states that decision maker is not at facility today and will review when she returns.  Edwyna Shell, LCSW Lead Clinical Social Worker Phone:  [phone]

## 2015-12-04 NOTE — Progress Notes (Signed)
Data. Patient denies HI/AVH. He continues to endorse SI and states,"What ever will happen, will happen". Patient does contract for safety while on the unit and tocome to staff befor acting on any SI toughts/feelings.Patient was very angry/irritated at the beginning of the shift, and refused his AM medications and to answer any questions, except to say he was suicidal. By 10:30 am patient was able to take his medications without difficulty. Patient interacting well with staff and other patients, for most of shift. Patient met with the housing coordinator for, "[facility]". Patient reports 10/10 for anxiety, depression and hopelessness on his self assessment. His goal for today is: "I wont to kill myself".  Action. Emotional support and encouragement offered. Education provided on medication, indications and side effect. Q 15 minute checks done for safety. Response. Safety on the unit maintained through 15 minute checks.  Medications taken as prescribed. Attended groups. Remained calm and appropriate through out shift.

## 2015-12-04 NOTE — Progress Notes (Signed)
D    Pt is pleasant and reports feeling better now that he has something to look forward to when he is discharged   He said I was trying to do the right thing for myself because I dont want to go back to the enviroment of drugs that I left   Pt interacts appropriately with others and has been compliant with treatment A    Verbal support given   Medications administered and effectiveness monitored   Encouraged patient in his efforts to make his life better   Q 15 min checks R    Pt safe at present and receptive to verbal support

## 2015-12-04 NOTE — Clinical Social Work Note (Signed)
Call from [facility], [place] [phone]), states that she will now come approx 1 PM.  States she is not certain facility has a bed for patient today.  Edwyna Shell, LCSW Lead Clinical Social Worker Phone:  [phone]

## 2015-12-04 NOTE — BHH Group Notes (Signed)
[facility] LCSW Group Therapy  12/04/2015 2:19 PM  Type of Therapy:  Group Therapy  Participation Level:  Did Not Attend  Beverely Pace 12/04/2015, 2:19 PM

## 2015-12-04 NOTE — BHH Group Notes (Signed)
[place] Group Notes:  (Nursing/MHT/Case Management/Adjunct)  Date:  12/04/2015  Time:  5:32 PM  Type of Therapy:  Nurse Education  Participation Level:  Minimal  Participation Quality:  Inattentive  Affect:  Flat and Irritable  Cognitive:  Alert  Insight:  Lacking  Engagement in Group:  Lacking  Modes of Intervention:  Education  Summary of Progress/Problems: Patient refused to give a goal today, stating, "Whatever happens, happens". No insight and minimal participation.  Cheri Kearns 12/04/2015, 5:32 PM

## 2015-12-04 NOTE — Plan of Care (Signed)
Problem: Medication: Goal: Compliance with prescribed medication regimen will improve Outcome: Progressing Patient refused to take his medications early this AM, but did agree to take then later in the morning and did so with no difficulty.

## 2015-12-05 LAB — GLUCOSE, CAPILLARY: GLUCOSE-CAPILLARY: 142 mg/dL — AB (ref 65–99)

## 2015-12-05 MED ORDER — RISPERIDONE 1 MG PO TBDP
1.0000 mg | ORAL_TABLET | Freq: Every day | ORAL | Status: DC
  Administered 2015-12-05 – 2015-12-07 (×3): 1 mg via ORAL
  Filled 2015-12-05 (×4): qty 1

## 2015-12-05 NOTE — Progress Notes (Signed)
[facility] MD Progress Note  12/05/2015 12:38 PM  Patient Active Problem List   Diagnosis Date Noted  . MDD (major depressive disorder), recurrent episode, moderate ([place]) 11/30/2015  . Cocaine dependence, continuous ([place]) 11/27/2015  . Psychoactive substance-induced mood disorder ([place]) 11/22/2015  . Chest pain 11/12/2015  . Type 2 diabetes mellitus with vascular disease ([place]) 11/12/2015  . History of seizure 11/12/2015  . Cocaine abuse 01/26/2015  . Cocaine-induced mood disorder ([place]) 01/26/2015  . Helicobacter pylori gastritis 05/30/2012  . Pure hypercholesterolemia 08/31/2011  . Essential hypertension, benign 08/31/2011  . Postsurgical aortocoronary bypass status 08/31/2011  . PAD (peripheral artery disease) ([place]) 08/31/2011  . GERD (gastroesophageal reflux disease) 12/07/2010  . Esophageal dysphagia 12/07/2010  . Constipation 12/07/2010  . Laryngopharyngeal reflux (LPR) 12/07/2010  . Lumbar pain with radiation down left leg 11/17/2010  . CARPAL TUNNEL SYNDROME 07/14/2009  . SHOULDER IMPINGEMENT SYNDROME, LEFT 05/05/2009    Diagnosis: Cocaine use disorder severe  Subjective: Patient did try the risperidone 2 mg by mouth daily at bedtime yesterday night but he reports it may been too strong. His arm hurts because he slept on it too soundly and he feels groggy today. He also reports he feels he had some various unusual dreams. We are still awaiting word back from a possible placement. He denies any acute suicidal or homicidal plan or intent.  Objective: Well-developed well-nourished man who does appear somewhat tired and groggy this morning pleasant and appropriate thought processes linear and goal-directed thought content no acute suicidal or homicidal ideation, plan or intent he does endorse some anxious ruminations about his future.   Current Facility-Administered Medications (Endocrine & Metabolic):  .  metFORMIN (GLUCOPHAGE) tablet 500 mg  Current Outpatient Prescriptions  (Endocrine & Metabolic):  .  metFORMIN (GLUCOPHAGE) 500 MG tablet, Take 1 tablet (500 mg total) by mouth 2 (two) times daily with a meal.  Current Facility-Administered Medications (Cardiovascular):  .  atorvastatin (LIPITOR) tablet 10 mg .  hydrochlorothiazide (HYDRODIURIL) tablet 25 mg .  lisinopril (PRINIVIL,ZESTRIL) tablet 2.5 mg .  metoprolol tartrate (LOPRESSOR) tablet 12.5 mg  Current Outpatient Prescriptions (Cardiovascular):  .  atorvastatin (LIPITOR) 10 MG tablet, Take 1 tablet (10 mg total) by mouth every morning. .  hydrochlorothiazide (HYDRODIURIL) 25 MG tablet, Take 1 tablet (25 mg total) by mouth daily. [name] Kitchen  lisinopril (ZESTRIL) 2.5 MG tablet, Take 1 tablet (2.5 mg total) by mouth daily. .  metoprolol tartrate (LOPRESSOR) 25 MG tablet, Take 0.5 tablets (12.5 mg total) by mouth 2 (two) times daily.  Current Facility-Administered Medications (Respiratory):  .  albuterol (PROVENTIL HFA;VENTOLIN HFA) 108 (90 Base) MCG/ACT inhaler 2 puff .  fluticasone (FLONASE) 50 MCG/ACT nasal spray 2 spray .  fluticasone (FLOVENT HFA) 44 MCG/ACT inhaler 2 puff .  ipratropium (ATROVENT) nebulizer solution 0.5 mg .  loratadine (CLARITIN) tablet 10 mg   Current Facility-Administered Medications (Analgesics):  .  acetaminophen (TYLENOL) tablet 650 mg   Current Facility-Administered Medications (Hematological):  .  clopidogrel (PLAVIX) tablet 75 mg  Current Outpatient Prescriptions (Hematological):  .  clopidogrel (PLAVIX) 75 MG tablet, Take 1 tablet (75 mg total) by mouth daily before breakfast.  Current Facility-Administered Medications (Other):  .  alum & mag hydroxide-simeth (MAALOX/MYLANTA) 200-200-20 MG/5ML suspension 30 mL .  hydrOXYzine (ATARAX/VISTARIL) tablet 25 mg .  hydrOXYzine (ATARAX/VISTARIL) tablet 50 mg .  levETIRAcetam (KEPPRA) tablet 500 mg .  lidocaine (LIDODERM) 5 % 1 patch .  LORazepam (ATIVAN) tablet 2 mg .  magnesium hydroxide (MILK OF MAGNESIA) suspension  30  mL .  pantoprazole (PROTONIX) EC tablet 40 mg .  potassium chloride SA (K-DUR,KLOR-CON) CR tablet 20 mEq .  pregabalin (LYRICA) capsule 100 mg .  risperiDONE (RISPERDAL M-TABS) disintegrating tablet 1 mg .  sertraline (ZOLOFT) tablet 150 mg .  tamsulosin (FLOMAX) capsule 0.4 mg .  tuberculin injection 5 Units  Current Outpatient Prescriptions (Other):  .  hydrOXYzine (ATARAX/VISTARIL) 25 MG tablet, Take 1 tablet (25 mg total) by mouth every 6 (six) hours as needed for anxiety. .  levETIRAcetam (KEPPRA) 500 MG tablet, Take 1 tablet (500 mg total) by mouth every 12 (twelve) hours. .  pantoprazole (PROTONIX) 40 MG tablet, Take 1 tablet (40 mg total) by mouth daily. .  potassium chloride SA (K-DUR,KLOR-CON) 20 MEQ tablet, Take 1 tablet (20 mEq total) by mouth daily. .  pregabalin (LYRICA) 100 MG capsule, Take 1 capsule (100 mg total) by mouth 3 (three) times daily. .  sertraline (ZOLOFT) 50 MG tablet, Take 3 tablets (150 mg total) by mouth daily. .  tamsulosin (FLOMAX) 0.4 MG CAPS capsule, Take 1 capsule (0.4 mg total) by mouth daily after breakfast.  Vital Signs:Blood pressure 109/72, pulse 93, temperature 97.8 F (36.6 C), temperature source Oral, resp. rate 18, height 5\' 10"  (1.778 m), weight 103.5 kg (228 lb 2.8 oz), SpO2 100 %.    Lab Results:  Results for orders placed or performed during the hospital encounter of 11/22/15 (from the past 48 hour(s))  Glucose, capillary     Status: Abnormal   Collection Time: 12/04/15  6:11 AM  Result Value Ref Range   Glucose-Capillary 102 (H) 65 - 99 mg/dL  Glucose, capillary     Status: Abnormal   Collection Time: 12/05/15  6:07 AM  Result Value Ref Range   Glucose-Capillary 142 (H) 65 - 99 mg/dL   Comment 1 Notify RN    Comment 2 Document in Chart     Physical Findings: AIMS: Facial and Oral Movements Muscles of Facial Expression: None, normal Lips and Perioral Area: None, normal Jaw: None, normal Tongue: None, normal,Extremity  Movements Upper (arms, wrists, hands, fingers): None, normal Lower (legs, knees, ankles, toes): None, normal, Trunk Movements Neck, shoulders, hips: None, normal, Overall Severity Severity of abnormal movements (highest score from questions above): None, normal Incapacitation due to abnormal movements: None, normal Patient's awareness of abnormal movements (rate only patient's report): No Awareness, Dental Status Current problems with teeth and/or dentures?: No Does patient usually wear dentures?: No  CIWA:  CIWA-Ar Total: 1 COWS:  COWS Total Score: 1   Assessment/Plan: Patient is awaiting possible placement and discharge should include a security escort and transportation off campus directly to his placement to avoid any negative interactions per staff and patient. He also has an order for when necessary Ativan to be given prior to release if necessary. He may be discharged over the weekend if an appropriate placement is found. At this time we will reduce the risperidone M tabs to 1 mg by mouth every at bedtime as 2 mg appears to been rather strong for the patient. Linard Millers, MD 12/05/2015, 12:38 PM

## 2015-12-05 NOTE — Progress Notes (Signed)
Recreation Therapy Notes  Date: 12/05/15 Time: 0930 Location: [address] Dayroom  Group Topic: Stress Management  Goal Area(s) Addresses:  Patient will verbalize importance of using healthy stress management.  Patient will identify positive emotions associated with healthy stress management.   Intervention: Guided Imagery Script  Activity :  Forest Visualization.  LRT introduced the stress management technique of guided imagery.  LRT read a script that allowed patients to participate in the activity.  Patients were to follow along as LRT read script.    Education:  Stress Management, Discharge Planning.   Education Outcome: Acknowledges edcuation/In group clarification offered/Needs additional education  Clinical Observations/Feedback: Pt did not attend group.     Lulie Hurd, LRT/CTRS         Shella Lahman A 12/05/2015 12:39 PM 

## 2015-12-05 NOTE — Plan of Care (Signed)
Problem: Education: Goal: Knowledge of the prescribed therapeutic regimen will improve Outcome: Not Progressing Patient did not get up this morning to take his AM medications until almost noon. Took his AM medications at that time.

## 2015-12-05 NOTE — Progress Notes (Signed)
Data. Patient denies HI/AVH. Patient continues to endorse SI, without a plan and is able to verbally contract for safety on the unit and to come to staff before acting on any self harm thoughts/feelings. Patient refused to get up for his morning meds until almost noon, at which time he did take them without difficulty. Patient spent most of this shift resting in his room/bed. On his self assessment patient reports, 7/10 for all areas. His goal for today is: "Back hurts/Get into placement".  Action. Emotional support and encouragement offered. Education provided on medication, indications and side effect. Q 15 minute checks done for safety. Response. Safety on the unit maintained through 15 minute checks.  Medications taken as prescribed. Attended groups. Remained calm and appropriate through out shift.  Addendum: Patient's TB skin test, read @ 5pm, shows no induration, redness or swelling at the test sight.

## 2015-12-05 NOTE — Progress Notes (Addendum)
Second message left for Neoma Laming regarding status of patient's referral.  Still pending at this time.  Needs TB test documented and read today.  Message left for Trinidad Curet with regards to referral for patient : [facility].  Will follow up once call returned.  Lane Hacker, MSW Clinical Social Work: [employer]

## 2015-12-05 NOTE — Progress Notes (Signed)
The patient attended this evening's A. A. Meeting and was appropriate.  

## 2015-12-05 NOTE — BHH Group Notes (Signed)
[facility] LCSW Group Therapy  12/05/2015 2:52 PM  Type of Therapy:  Group Therapy  Participation Level:  Did Not Attend    Lilly Cove 12/05/2015, 2:52 PM

## 2015-12-06 LAB — GLUCOSE, CAPILLARY: Glucose-Capillary: 100 mg/dL — ABNORMAL HIGH (ref 65–99)
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
## 2015-12-06 NOTE — Progress Notes (Signed)
D.  Pt pleasant on approach, denies complaints at this time.  Positive for evening wrap up group, see group notes.  Pt observed interacting appropriately with peers in dayroom.  Pt denies SI/HI/hallucinations at this time.  A.  Support and encouragement offered, medication given as ordered R.  PT remains safe on the unit, will continue to monitor.

## 2015-12-06 NOTE — Progress Notes (Signed)
Data. Patient denies HI/AVH. Patient continues to endorse suicidal thoughts, with no set plan at this time. Patient's affects is alternately sullen and bright.   Patient interacting well with staff and other patients. On hs self assessment he reports 8/10 for depression, anxiety and hopelessness. His goal for today is: "That I will get into the program". Action. Emotional support and encouragement offered. Education provided on medication, indications and side effect. Q 15 minute checks done for safety. Response. Safety on the unit maintained through 15 minute checks.  Medications taken as prescribed. Attended groups. Remained calm and appropriate through out shift.

## 2015-12-06 NOTE — BHH Group Notes (Signed)
  [facility] LCSW Group Therapy Note   12/06/2015  and  10:00 AM  Type of Therapy and Topic:  Group Therapy: Avoiding Self-Sabotaging and Enabling Behaviors  Participation Level:  Active  Participation Quality:  Appropriate and Sharing  Affect:  Appropriate and Irritable  Cognitive:  Appropriate  Insight:  Developing/Improving  Engagement in Therapy:  Distracting   Therapeutic models used: Cognitive Behavioral Therapy,  Person-Centered Therapy and Motivational Interviewing  Modes of Intervention:  Discussion, Exploration, Orientation, Rapport Building, Socialization and Support   Summary of Progress/Problems:  The main focus of today's process group was for the patient to identify ways in which they have in the past sabotaged their own recovery. Motivational Interviewing was utilized to identify motivation they may have for wanting to change. The Stages of Change were explained using a handout, and patients identified where they currently are with regard to stages of change. Patient states he is in preparation stage "to do this all over again; get clean, maintain and then relapse." Pt processed how two grandsons are triggers to get clean as he is proud of both and feels they share unconditional love in comparison to relationship with one of his sons. With son p[atient processed the judgement they both have for one another.    Sheilah Pigeon, LCSW

## 2015-12-06 NOTE — Progress Notes (Signed)
[facility] MD Progress Note  12/06/2015 1:08 PM  Patient Active Problem List   Diagnosis Date Noted  . MDD (major depressive disorder), recurrent episode, moderate ([place]) 11/30/2015  . Cocaine dependence, continuous ([place]) 11/27/2015  . Psychoactive substance-induced mood disorder ([place]) 11/22/2015  . Chest pain 11/12/2015  . Type 2 diabetes mellitus with vascular disease ([place]) 11/12/2015  . History of seizure 11/12/2015  . Cocaine abuse 01/26/2015  . Cocaine-induced mood disorder ([place]) 01/26/2015  . Helicobacter pylori gastritis 05/30/2012  . Pure hypercholesterolemia 08/31/2011  . Essential hypertension, benign 08/31/2011  . Postsurgical aortocoronary bypass status 08/31/2011  . PAD (peripheral artery disease) ([place]) 08/31/2011  . GERD (gastroesophageal reflux disease) 12/07/2010  . Esophageal dysphagia 12/07/2010  . Constipation 12/07/2010  . Laryngopharyngeal reflux (LPR) 12/07/2010  . Lumbar pain with radiation down left leg 11/17/2010  . CARPAL TUNNEL SYNDROME 07/14/2009  . SHOULDER IMPINGEMENT SYNDROME, LEFT 05/05/2009    Diagnosis: Cocaine use disorder severe and substance abuse mood disorder  Subjective: Patient has been complaining about increased symptoms of depression and suicidal ideation and also endorses cocaine abuse but does not believe cocaine has an impact on his depressionandSuicidal thoughts. Patient reportedly wants to go to the rehabilitation and also had a TB testing which required for placement. We are still awaiting word back from a possible placement. He denies any acute suicidal or homicidal plan or intent.  Objective: Patient seen participating in milieu therapy and group therapies. Patient reported he has been fairly doing well without significant behavioral or emotional problems. Patient endorses passive suicidal ideations but denies any intention or plans. Patient endorses depression 5 out of 1010 being the worst. Well-developed well-nourished man who does appear  somewhat tired and groggy this morning pleasant and appropriate thought processes linear and goal-directed thought content, he does endorse some anxious ruminations about his future.   Current Facility-Administered Medications (Endocrine & Metabolic):  .  metFORMIN (GLUCOPHAGE) tablet 500 mg  Current Outpatient Prescriptions (Endocrine & Metabolic):  .  metFORMIN (GLUCOPHAGE) 500 MG tablet, Take 1 tablet (500 mg total) by mouth 2 (two) times daily with a meal.  Current Facility-Administered Medications (Cardiovascular):  .  atorvastatin (LIPITOR) tablet 10 mg .  hydrochlorothiazide (HYDRODIURIL) tablet 25 mg .  lisinopril (PRINIVIL,ZESTRIL) tablet 2.5 mg .  metoprolol tartrate (LOPRESSOR) tablet 12.5 mg  Current Outpatient Prescriptions (Cardiovascular):  .  atorvastatin (LIPITOR) 10 MG tablet, Take 1 tablet (10 mg total) by mouth every morning. .  hydrochlorothiazide (HYDRODIURIL) 25 MG tablet, Take 1 tablet (25 mg total) by mouth daily. [name] Kitchen  lisinopril (ZESTRIL) 2.5 MG tablet, Take 1 tablet (2.5 mg total) by mouth daily. .  metoprolol tartrate (LOPRESSOR) 25 MG tablet, Take 0.5 tablets (12.5 mg total) by mouth 2 (two) times daily.  Current Facility-Administered Medications (Respiratory):  .  albuterol (PROVENTIL HFA;VENTOLIN HFA) 108 (90 Base) MCG/ACT inhaler 2 puff .  fluticasone (FLONASE) 50 MCG/ACT nasal spray 2 spray .  fluticasone (FLOVENT HFA) 44 MCG/ACT inhaler 2 puff .  ipratropium (ATROVENT) nebulizer solution 0.5 mg .  loratadine (CLARITIN) tablet 10 mg   Current Facility-Administered Medications (Analgesics):  .  acetaminophen (TYLENOL) tablet 650 mg   Current Facility-Administered Medications (Hematological):  .  clopidogrel (PLAVIX) tablet 75 mg  Current Outpatient Prescriptions (Hematological):  .  clopidogrel (PLAVIX) 75 MG tablet, Take 1 tablet (75 mg total) by mouth daily before breakfast.  Current Facility-Administered Medications (Other):  .  alum & mag  hydroxide-simeth (MAALOX/MYLANTA) 200-200-20 MG/5ML suspension 30 mL .  hydrOXYzine (ATARAX/VISTARIL)  tablet 25 mg .  hydrOXYzine (ATARAX/VISTARIL) tablet 50 mg .  levETIRAcetam (KEPPRA) tablet 500 mg .  lidocaine (LIDODERM) 5 % 1 patch .  LORazepam (ATIVAN) tablet 2 mg .  magnesium hydroxide (MILK OF MAGNESIA) suspension 30 mL .  pantoprazole (PROTONIX) EC tablet 40 mg .  potassium chloride SA (K-DUR,KLOR-CON) CR tablet 20 mEq .  pregabalin (LYRICA) capsule 100 mg .  risperiDONE (RISPERDAL M-TABS) disintegrating tablet 1 mg .  sertraline (ZOLOFT) tablet 150 mg .  tamsulosin (FLOMAX) capsule 0.4 mg  Current Outpatient Prescriptions (Other):  .  hydrOXYzine (ATARAX/VISTARIL) 25 MG tablet, Take 1 tablet (25 mg total) by mouth every 6 (six) hours as needed for anxiety. .  levETIRAcetam (KEPPRA) 500 MG tablet, Take 1 tablet (500 mg total) by mouth every 12 (twelve) hours. .  pantoprazole (PROTONIX) 40 MG tablet, Take 1 tablet (40 mg total) by mouth daily. .  potassium chloride SA (K-DUR,KLOR-CON) 20 MEQ tablet, Take 1 tablet (20 mEq total) by mouth daily. .  pregabalin (LYRICA) 100 MG capsule, Take 1 capsule (100 mg total) by mouth 3 (three) times daily. .  sertraline (ZOLOFT) 50 MG tablet, Take 3 tablets (150 mg total) by mouth daily. .  tamsulosin (FLOMAX) 0.4 MG CAPS capsule, Take 1 capsule (0.4 mg total) by mouth daily after breakfast.  Vital Signs:Blood pressure 105/67, pulse 81, temperature 97.9 F (36.6 C), resp. rate 16, height 5\' 10"  (1.778 m), weight 103.5 kg (228 lb 2.8 oz), SpO2 100 %.    Lab Results:  Results for orders placed or performed during the hospital encounter of 11/22/15 (from the past 48 hour(s))  Glucose, capillary     Status: Abnormal   Collection Time: 12/05/15  6:07 AM  Result Value Ref Range   Glucose-Capillary 142 (H) 65 - 99 mg/dL   Comment 1 Notify RN    Comment 2 Document in Chart   Glucose, capillary     Status: Abnormal   Collection Time: 12/06/15   5:53 AM  Result Value Ref Range   Glucose-Capillary 100 (H) 65 - 99 mg/dL    Physical Findings: AIMS: Facial and Oral Movements Muscles of Facial Expression: None, normal Lips and Perioral Area: None, normal Jaw: None, normal Tongue: None, normal,Extremity Movements Upper (arms, wrists, hands, fingers): None, normal Lower (legs, knees, ankles, toes): None, normal, Trunk Movements Neck, shoulders, hips: None, normal, Overall Severity Severity of abnormal movements (highest score from questions above): None, normal Incapacitation due to abnormal movements: None, normal Patient's awareness of abnormal movements (rate only patient's report): No Awareness, Dental Status Current problems with teeth and/or dentures?: No Does patient usually wear dentures?: No  CIWA:  CIWA-Ar Total: 1 COWS:  COWS Total Score: 1   Assessment/Plan:  Reviewed current treatment plan and medication management Patient has no current withdrawal symptoms of drugs Patient is slowly improving his mental status and asking for the rehabilitation treatment Patient is awaiting possible placement and discharge should include a security escort and transportation off campus directly to his placement to avoid any negative interactions per staff and patient.  He also has an order for when necessary Ativan to be given prior to release if necessary.  He may be discharged over the weekend if an appropriate placement is found.  At this time we will reduce the risperidone M tabs to 1 mg by mouth every at bedtime as 2 mg appears to been rather strong for the patient.  Ambrose Finland, MD 12/06/2015, 1:08 PM

## 2015-12-06 NOTE — Plan of Care (Signed)
Problem: Activity: Goal: Interest or engagement in leisure activities will improve Outcome: Progressing Patient has been in the milieu more today. He has attended groups and given responsive answers to questions.

## 2015-12-06 NOTE — Progress Notes (Signed)
D Pt. Reports he always has passive SI but contracts for safety.  Pt. Is labile, cooperative this pm.  Pt. Denies AVH, and HI.NO complaints of pain or discomfort noted  A Writer offered support and encouragement, discussed pt.'s day.  R Pt. Remains safe on the unit, is fixated on going to a long term treatment center.Pt. Rated his day a 6, his anxiety n 8 and his depression  A 10.

## 2015-12-06 NOTE — BHH Group Notes (Signed)
[facility] Group Notes:  (Nursing/MHT/Case Management/Adjunct)  Date:  12/06/2015  Time:  11:29 AM  Type of Therapy:  Nurse Education  Participation Level:  Active  Participation Quality:  Appropriate  Affect:  Appropriate  Cognitive:  Appropriate  Insight:  Appropriate  Engagement in Group:  Developing/Improving  Modes of Intervention:  Discussion  Summary of Progress/Problems: attended, stated goal was "to hear some good news about placement".  Royston Cowper Shacara Cozine 12/06/2015, 11:29 AM

## 2015-12-07 LAB — GLUCOSE, CAPILLARY: GLUCOSE-CAPILLARY: 106 mg/dL — AB (ref 65–99)

## 2015-12-07 MED ORDER — LORAZEPAM 1 MG PO TABS
2.0000 mg | ORAL_TABLET | Freq: Once | ORAL | Status: AC
  Administered 2015-12-07: 2 mg via ORAL

## 2015-12-07 MED ORDER — LORAZEPAM 1 MG PO TABS
ORAL_TABLET | ORAL | Status: AC
  Filled 2015-12-07: qty 2

## 2015-12-07 NOTE — Progress Notes (Signed)
Patient ID: Dalton [name], male   DOB: Dec 06, 1960, 55 y.o.   MRN: [id number]    D: Pt has been appropriate on the unit today, he reported that he felt better today because he may have a placement at [facility]. Pt reported that people think he is a butthole and that was not the case. Pt reported that he knows what's best for him and that was to go to a place where he would be safety and away from drugs. Pt reported that his depression was a 6, his hopelessness was a 6, and his anxiety was a 6. Pt reported that he remained positive SI, but was able to contract for safety. Pt reported being negative HI, no AH/VH noted. Pt reported that his goal was to stay focus and pray that his God works everything out for him. Pt did get into a verbal altercation with another patient over the TV, patient was very agitated and started communicating threats. This writer was able to remove patient from room however patient remained very agitated. Doctor made aware of situation, new orders noted to give Ativan 2mg  x1 now. Patient was given medications, no other issues or concerns noted.  A: 15 min checks continued for patient safety. R: Pt safety maintained.

## 2015-12-07 NOTE — Progress Notes (Signed)
D.  Pt pleasant on approach, denies complaints at this time.  Positive for evening AA group with appropriate participation.  Pt observed interacting in dayroom appropriately with peers on the unit.  Pt denies SI/HI/hallucinations at this time, but does report intermittent passive SI at times.  A.  Support and encouragement offered, medication given as ordered  R.  Pt remains safe on unit, will continue to monitor.

## 2015-12-07 NOTE — BHH Group Notes (Signed)
[facility] LCSW Group Therapy  12/07/2015 10:05 to 10:55 AM  Type of Therapy:  Group Therapy  Participation Level:  Active  Participation Quality:  Appropriate  Affect:  Appropriate  Cognitive:  Alert and Oriented  Insight:  Developing/Improving  Engagement in Therapy:  Engaged  Modes of Intervention:  Activity, Exploration, Socialization and Support  Summary of Progress/Problems: Topic for today was thoughts and feelings regarding discharge. We discussed fears of upcoming changes including judgements, expectations and stigma of mental health issues. Patient's shared their current attitude toward the future; patient choosing the word "optomistic." Patient engaged easily and offered support to others.   Patient chose a visual to represent decompensation as "angry family members" and improvement as "helping others."  Sheilah Pigeon, LCSW

## 2015-12-07 NOTE — Progress Notes (Signed)
[facility] MD Progress Note  12/07/2015 1:57 PM  Patient Active Problem List   Diagnosis Date Noted  . MDD (major depressive disorder), recurrent episode, moderate ([place]) 11/30/2015  . Cocaine dependence, continuous ([place]) 11/27/2015  . Psychoactive substance-induced mood disorder ([place]) 11/22/2015  . Chest pain 11/12/2015  . Type 2 diabetes mellitus with vascular disease ([place]) 11/12/2015  . History of seizure 11/12/2015  . Cocaine abuse 01/26/2015  . Cocaine-induced mood disorder ([place]) 01/26/2015  . Helicobacter pylori gastritis 05/30/2012  . Pure hypercholesterolemia 08/31/2011  . Essential hypertension, benign 08/31/2011  . Postsurgical aortocoronary bypass status 08/31/2011  . PAD (peripheral artery disease) ([place]) 08/31/2011  . GERD (gastroesophageal reflux disease) 12/07/2010  . Esophageal dysphagia 12/07/2010  . Constipation 12/07/2010  . Laryngopharyngeal reflux (LPR) 12/07/2010  . Lumbar pain with radiation down left leg 11/17/2010  . CARPAL TUNNEL SYNDROME 07/14/2009  . SHOULDER IMPINGEMENT SYNDROME, LEFT 05/05/2009    Diagnosis: Cocaine use disorder severe and substance abuse mood disorder  Subjective: Patient stated that ""I'm still alive, I cannot stay here too long, hopefully I have some good news tomorrow regarding my disposition plan/ placement"  Patient reportedly wants to go to the rehabilitation and also had a TB testing which Negative ,required for placement. [facility] He denies acute suicidal or homicidal plan or intent.  Objective: Patient seen today face-to-face for this evaluation and observation participating in milieu therapy and group therapies. Staff reported patient has a normal verbal altercation with the peer and required when necessary medication. Patient has been fairly doing well without significant behavioral or emotional problems. Patient endorses passive suicidal ideations but denies any intention or plans. Patient  endorses depression 5 out of 10. Patient endorses having hard time to sleep last night because of poor read about placement issues  Current Facility-Administered Medications (Endocrine & Metabolic):  .  metFORMIN (GLUCOPHAGE) tablet 500 mg  Current Outpatient Prescriptions (Endocrine & Metabolic):  .  metFORMIN (GLUCOPHAGE) 500 MG tablet, Take 1 tablet (500 mg total) by mouth 2 (two) times daily with a meal.  Current Facility-Administered Medications (Cardiovascular):  .  atorvastatin (LIPITOR) tablet 10 mg .  hydrochlorothiazide (HYDRODIURIL) tablet 25 mg .  lisinopril (PRINIVIL,ZESTRIL) tablet 2.5 mg .  metoprolol tartrate (LOPRESSOR) tablet 12.5 mg  Current Outpatient Prescriptions (Cardiovascular):  .  atorvastatin (LIPITOR) 10 MG tablet, Take 1 tablet (10 mg total) by mouth every morning. .  hydrochlorothiazide (HYDRODIURIL) 25 MG tablet, Take 1 tablet (25 mg total) by mouth daily. [name] Kitchen  lisinopril (ZESTRIL) 2.5 MG tablet, Take 1 tablet (2.5 mg total) by mouth daily. .  metoprolol tartrate (LOPRESSOR) 25 MG tablet, Take 0.5 tablets (12.5 mg total) by mouth 2 (two) times daily.  Current Facility-Administered Medications (Respiratory):  .  albuterol (PROVENTIL HFA;VENTOLIN HFA) 108 (90 Base) MCG/ACT inhaler 2 puff .  fluticasone (FLONASE) 50 MCG/ACT nasal spray 2 spray .  fluticasone (FLOVENT HFA) 44 MCG/ACT inhaler 2 puff .  ipratropium (ATROVENT) nebulizer solution 0.5 mg .  loratadine (CLARITIN) tablet 10 mg   Current Facility-Administered Medications (Analgesics):  .  acetaminophen (TYLENOL) tablet 650 mg   Current Facility-Administered Medications (Hematological):  .  clopidogrel (PLAVIX) tablet 75 mg  Current Outpatient Prescriptions (Hematological):  .  clopidogrel (PLAVIX) 75 MG tablet, Take 1 tablet (75 mg total) by mouth daily before breakfast.  Current Facility-Administered Medications (Other):  .  alum & mag hydroxide-simeth (MAALOX/MYLANTA) 200-200-20 MG/5ML  suspension 30 mL .  hydrOXYzine (ATARAX/VISTARIL) tablet 25 mg .  levETIRAcetam (KEPPRA) tablet 500 mg .  lidocaine (LIDODERM) 5 % 1 patch .  LORazepam (ATIVAN) 1 MG tablet .  LORazepam (ATIVAN) tablet 2 mg .  magnesium hydroxide (MILK OF MAGNESIA) suspension 30 mL .  pantoprazole (PROTONIX) EC tablet 40 mg .  potassium chloride SA (K-DUR,KLOR-CON) CR tablet 20 mEq .  pregabalin (LYRICA) capsule 100 mg .  risperiDONE (RISPERDAL M-TABS) disintegrating tablet 1 mg .  sertraline (ZOLOFT) tablet 150 mg .  tamsulosin (FLOMAX) capsule 0.4 mg  Current Outpatient Prescriptions (Other):  .  hydrOXYzine (ATARAX/VISTARIL) 25 MG tablet, Take 1 tablet (25 mg total) by mouth every 6 (six) hours as needed for anxiety. .  levETIRAcetam (KEPPRA) 500 MG tablet, Take 1 tablet (500 mg total) by mouth every 12 (twelve) hours. .  pantoprazole (PROTONIX) 40 MG tablet, Take 1 tablet (40 mg total) by mouth daily. .  potassium chloride SA (K-DUR,KLOR-CON) 20 MEQ tablet, Take 1 tablet (20 mEq total) by mouth daily. .  pregabalin (LYRICA) 100 MG capsule, Take 1 capsule (100 mg total) by mouth 3 (three) times daily. .  sertraline (ZOLOFT) 50 MG tablet, Take 3 tablets (150 mg total) by mouth daily. .  tamsulosin (FLOMAX) 0.4 MG CAPS capsule, Take 1 capsule (0.4 mg total) by mouth daily after breakfast.  Vital Signs:Blood pressure 131/66, pulse 76, temperature 97.9 F (36.6 C), resp. rate 16, height 5\' 10"  (1.778 m), weight 103.5 kg (228 lb 2.8 oz), SpO2 100 %.    Lab Results:  Results for orders placed or performed during the hospital encounter of 11/22/15 (from the past 48 hour(s))  Glucose, capillary     Status: Abnormal   Collection Time: 12/06/15  5:53 AM  Result Value Ref Range   Glucose-Capillary 100 (H) 65 - 99 mg/dL  Glucose, capillary     Status: Abnormal   Collection Time: 12/07/15  5:51 AM  Result Value Ref Range   Glucose-Capillary 106 (H) 65 - 99 mg/dL   Comment 1 Notify RN    Comment 2  Document in Chart     Physical Findings: AIMS: Facial and Oral Movements Muscles of Facial Expression: None, normal Lips and Perioral Area: None, normal Jaw: None, normal Tongue: None, normal,Extremity Movements Upper (arms, wrists, hands, fingers): None, normal Lower (legs, knees, ankles, toes): None, normal, Trunk Movements Neck, shoulders, hips: None, normal, Overall Severity Severity of abnormal movements (highest score from questions above): None, normal Incapacitation due to abnormal movements: None, normal Patient's awareness of abnormal movements (rate only patient's report): No Awareness, Dental Status Current problems with teeth and/or dentures?: No Does patient usually wear dentures?: No  CIWA:  CIWA-Ar Total: 1 COWS:  COWS Total Score: 1   Assessment/Plan:  Reviewed current treatment plan and medication management Patient has no current withdrawal symptoms of drugs Patient is slowly improving his mental status and asking for the rehabilitation treatment Patient is awaiting possible placement and discharge should include a security escort and transportation off campus directly to his placement to avoid any negative interactions per staff and patient.  He also has an order for when necessary Ativan to be given prior to release if necessary.  He may be discharged over the weekend if an appropriate placement is found.  At this time we will reduce the risperidone M tabs to 1 mg by mouth every at bedtime as 2 mg appears to been rather strong for the patient.  Ambrose Finland, MD 12/07/2015, 1:57 PM

## 2015-12-07 NOTE — Plan of Care (Signed)
Problem: Activity: Goal: Interest or engagement in activities will improve Outcome: Progressing Pt attended evening wrap up group with appropriate participation

## 2015-12-07 NOTE — Progress Notes (Signed)
Patient attended AA group meeting.  

## 2015-12-08 LAB — GLUCOSE, CAPILLARY: Glucose-Capillary: 96 mg/dL (ref 65–99)

## 2015-12-08 MED ORDER — RISPERIDONE 1 MG PO TBDP
1.0000 mg | ORAL_TABLET | Freq: Two times a day (BID) | ORAL | Status: DC
  Administered 2015-12-08 – 2015-12-09 (×3): 1 mg via ORAL
  Filled 2015-12-08 (×5): qty 1

## 2015-12-08 MED ORDER — HYDROXYZINE HCL 50 MG PO TABS
50.0000 mg | ORAL_TABLET | Freq: Every evening | ORAL | Status: DC | PRN
  Administered 2015-12-08: 50 mg via ORAL
  Filled 2015-12-08: qty 6
  Filled 2015-12-08: qty 1

## 2015-12-08 NOTE — Clinical Social Work Note (Signed)
Call from Jerilee Hoh at [facility] [phone]) - CSW returned call and left VM as no answer.    Edwyna Shell, LCSW Lead Clinical Social Worker Phone:  [phone]

## 2015-12-08 NOTE — [facility] Suicide Risk Assessment (Signed)
[facility] Discharge Suicide Risk Assessment   Principal Problem: Cocaine dependence, continuous [facility]) Discharge Diagnoses:  Patient Active Problem List   Diagnosis Date Noted  . MDD (major depressive disorder), recurrent episode, moderate ([place]) [F33.1] 11/30/2015  . Cocaine dependence, continuous ([place]) [F14.20] 11/27/2015  . Psychoactive substance-induced mood disorder ([place]) [id number], F06.30] 11/22/2015  . Chest pain [R07.9] 11/12/2015  . Type 2 diabetes mellitus with vascular disease ([place]) [E11.59] 11/12/2015  . History of seizure [Z87.898] 11/12/2015  . Cocaine abuse [F14.10] 01/26/2015  . Cocaine-induced mood disorder ([place]) [F14.94] 01/26/2015  . Helicobacter pylori gastritis [K29.70, B96.81] 05/30/2012  . Pure hypercholesterolemia [E78.00] 08/31/2011  . Essential hypertension, benign [I10] 08/31/2011  . 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 PAD (peripheral artery disease) ([place]) [I73.9] 08/31/2011  . GERD (gastroesophageal reflux disease) [K21.9] 12/07/2010  . Esophageal dysphagia [R13.10] 12/07/2010  . Constipation [K59.00] 12/07/2010  . Laryngopharyngeal reflux (LPR) [K21.9] 12/07/2010  . Lumbar pain with radiation down left leg [M54.5] 11/17/2010  . CARPAL TUNNEL SYNDROME [G56.00] 07/14/2009  . SHOULDER IMPINGEMENT SYNDROME, LEFT [M75.80] 05/05/2009    Total Time spent with patient: 15 minutes  Musculoskeletal: Strength & Muscle Tone: within normal limits Gait & Station: normal Patient leans: N/A  Psychiatric Specialty Exam: ROS  Blood pressure 124/75, pulse 81, temperature 97.7 F (36.5 C), temperature source Oral, resp. rate 16, height 5\' 10"  (1.778 m), weight 103.5 kg (228 lb 2.8 oz), SpO2 100 %.Body mass index is 32.74 kg/m.  General Appearance: Casual  Eye Contact::  Good  Speech:  Clear and Coherent409  Volume:  Normal  Mood:  Euthymic  Affect:  Congruent  Thought Process:  Coherent  Orientation:  Full (Time, Place, and Person)  Thought  Content:  Negative  Suicidal Thoughts:  No  Homicidal Thoughts:  No  Memory:  Negative  Judgement:  Fair  Insight:  Fair  Psychomotor Activity:  Normal  Concentration:  Good  Recall:  Good  Fund of Knowledge:Good  Language: Good  Akathisia:  No  Handed:  Right  AIMS (if indicated):     Assets:  Resilience  Sleep:  Number of Hours: 5.25  Cognition: WNL  ADL's:  Intact   Mental Status Per Nursing Assessment::   On Admission:     Demographic Factors:  Male and Unemployed  Loss Factors: NA  Historical Factors: Family history of mental illness or substance abuse  Risk Reduction Factors:   Sense of responsibility to family  Continued Clinical Symptoms:  Alcohol/Substance Abuse/Dependencies  Cognitive Features That Contribute To Risk:  None    Suicide Risk:  Mild:  Suicidal ideation of limited frequency, intensity, duration, and specificity.  There are no identifiable plans, no associated intent, mild dysphoria and related symptoms, good self-control (both objective and subjective assessment), few other risk factors, and identifiable protective factors, including available and accessible social support.  Follow-up Information    [facility] Follow up on 12/09/2015.   Why:  Therapy appointment on 10/17 at 10 AM.  Please call to cancel/reschedule if needed.  Bring hospital discharge paperwork to this appointment.   Contact information: [place]  [address] Phone:  [phone] Fax:  [phone]       [facility] Follow up on 12/18/2015.   Why:  Medications management appointment on 10/26 at 2:10 PM.  Please call to cancel/reschedule if needed.  Bring hospital discharge paperwork to this appointment.   Contact information: [place]   Ph:  [phone]  Fax:  [phone]          Plan Of Care/Follow-up recommendations:  Other:  Mr. Naeem has been accepted at a program that he finds  acceptable. Currently he endorses that he is willing to go there and happy with this placement. We will of course provide security to ensure that he makes a successful transition. At present he does deny any suicidal or homicidal ideation, plan or intent.  Linard Millers, MD 12/08/2015, 3:10 PM

## 2015-12-08 NOTE — BHH Group Notes (Signed)
[place] LCSW Group Therapy  12/08/2015 4:06 PM  Type of Therapy:  Group Therapy  Participation Level:  Active  Participation Quality:  Attentive  Affect:  Appropriate  Cognitive:  Alert  Insight:  Improving  Engagement in Therapy:  Improving  Modes of Intervention:  Discussion, Education, Exploration, Limit-setting, Problem-solving, Rapport Building, Socialization and Support  Summary of Progress/Problems: Today's Topic: Overcoming Obstacles. Patients identified one short term goal and potential obstacles in reaching this goal. Patients processed barriers involved in overcoming these obstacles. Patients identified steps necessary for overcoming these obstacles and explored motivation (internal and external) for facing these difficulties head on. Dalton [name] was attentive and engaged during today's processing group. He shared that his biggest obstacle was getting into a safe place. "I just got accepted to the [facility]." He continues to show progress in the group setting.   Dalton Davee N Smart LCSW 12/08/2015, 4:06 PM

## 2015-12-08 NOTE — Tx Team (Addendum)
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 acetaminophen (TYLENOL) tablet 650 mg  650 mg Oral Q6H PRN Laverle Hobby, PA-C   650 mg at 12/07/15 2200  . albuterol (PROVENTIL HFA;VENTOLIN HFA) 108 (90 Base) MCG/ACT inhaler 2 puff  2 puff Inhalation Q6H PRN Jenne Campus, MD   2 puff at 12/07/15 1115  . alum & mag hydroxide-simeth (MAALOX/MYLANTA) 200-200-20 MG/5ML suspension 30 mL  30 mL Oral Q4H PRN Laverle Hobby, PA-C      . atorvastatin (LIPITOR) tablet 10 mg  10 mg Oral q morning - 10a Laverle Hobby, PA-C   10 mg at 12/08/15 0825  . clopidogrel (PLAVIX) tablet 75 mg  75 mg Oral QAC breakfast Laverle Hobby, PA-C   75 mg at 12/08/15 0604  . fluticasone (FLONASE) 50 MCG/ACT nasal spray 2 spray  2 spray Each Nare Daily PRN Laverle Hobby, PA-C      . fluticasone (FLOVENT HFA) 44 MCG/ACT inhaler 2 puff  2 puff Inhalation BID Jenne Campus, MD   2 puff at 12/08/15 0827  . hydrochlorothiazide (HYDRODIURIL) tablet 25 mg  25 mg Oral Daily Laverle Hobby, PA-C   25 mg at 12/08/15 [id number]  . hydrOXYzine (ATARAX/VISTARIL) tablet 25 mg  25 mg Oral Q6H PRN Laverle Hobby, PA-C   25 mg at 12/07/15 2200  . hydrOXYzine (ATARAX/VISTARIL) tablet 50 mg  50 mg Oral QHS PRN,MR X 1 Rozetta Nunnery, NP      . ipratropium (ATROVENT) nebulizer solution 0.5 mg  0.5 mg Nebulization Q6H PRN Laverle Hobby, PA-C   0.5 mg at 11/23/15 1558  . levETIRAcetam (KEPPRA) tablet 500 mg  500 mg Oral Q12H Laverle Hobby, PA-C   500  mg at 12/08/15 0825  . lidocaine (LIDODERM) 5 % 1 patch  1 patch Transdermal Daily Norman Clay, MD   1 patch at 12/08/15 [phone]  . lisinopril (PRINIVIL,ZESTRIL) tablet 2.5 mg  2.5 mg Oral Daily Laverle Hobby, PA-C   2.5 mg at 12/08/15 0825  . loratadine (CLARITIN) tablet 10 mg  10 mg Oral Daily Laverle Hobby, PA-C   10 mg at 12/08/15 [id number]  . LORazepam (ATIVAN) tablet 2 mg  2 mg Oral Once PRN Linard Millers, MD      . magnesium hydroxide (MILK OF MAGNESIA) suspension 30 mL  30 mL Oral Daily PRN Laverle Hobby, PA-C      . metFORMIN (GLUCOPHAGE) tablet 500 mg  500 mg Oral BID WC Laverle Hobby, PA-C   500 mg at 12/08/15 [id number]  . metoprolol tartrate (LOPRESSOR) tablet 12.5 mg  12.5 mg Oral BID Laverle Hobby, PA-C   12.5 mg at 12/08/15 [id number]  . pantoprazole (PROTONIX) EC tablet 40 mg  40 mg Oral Daily Laverle Hobby, PA-C   40 mg at 12/08/15 [id number]  . potassium chloride SA (K-DUR,KLOR-CON) CR tablet 20 mEq  20 mEq Oral Daily Laverle Hobby, PA-C   20 mEq at 12/08/15 [id number]  . pregabalin (LYRICA) capsule 100 mg  100 mg Oral TID Laverle Hobby, PA-C  100 mg at 12/08/15 1159  . risperiDONE (RISPERDAL M-TABS) disintegrating tablet 1 mg  1 mg Oral BID Linard Millers, MD   1 mg at 12/08/15 1159  . sertraline (ZOLOFT) tablet 150 mg  150 mg Oral Daily Norman Clay, MD   150 mg at 12/08/15 0825  . tamsulosin (FLOMAX) capsule 0.4 mg  0.4 mg Oral QPC breakfast Laverle Hobby, PA-C   0.4 mg at 12/08/15 0825   PTA Medications: Prescriptions Prior to Admission  Medication Sig Dispense Refill Last Dose  . albuterol (PROAIR HFA) 108 (90 BASE) MCG/ACT inhaler Inhale 2 puffs into the lungs every 6 (six) hours as needed for wheezing (for shortness of breath).   Past Week at Unknown time  . albuterol (PROVENTIL) (2.5 MG/3ML) 0.083% nebulizer solution Take 3 mLs (2.5 mg total) by nebulization 2 (two) times daily. (Patient taking differently: Take 2.5 mg by nebulization 3 (three) times daily. ) 75 mL   11/21/2015 at am  . alprazolam (XANAX) 2 MG tablet Take 2 mg by mouth 2 (two) times daily.   11/21/2015 at am  . aspirin EC 81 MG tablet Take 1 tablet (81 mg total) by mouth daily. 30 tablet 0 11/21/2015 at am  . atorvastatin (LIPITOR) 10 MG tablet Take 1 tablet (10 mg total) by mouth every morning.   11/21/2015 at am  . beclomethasone (QVAR) 80 MCG/ACT inhaler Inhale 2 puffs into the lungs 2 (two) times daily. (Patient taking differently: Inhale 2 puffs into the lungs 2 (two) times daily as needed. ) 1 Inhaler 12 Past Week at Unknown time  . cetirizine (ZYRTEC) 10 MG tablet Take 10 mg by mouth daily as needed for allergies or rhinitis. Reported on 04/16/2015   UNK at [facility]  . clopidogrel (PLAVIX) 75 MG tablet Take 75 mg by mouth daily before breakfast.   11/21/2015 at am  . diclofenac sodium (VOLTAREN) 1 % GEL Place 1 application onto the skin 4 (four) times daily as needed.   Past Month at Unknown time  . doxycycline (VIBRA-TABS) 100 MG tablet Take 1 tablet (100 mg total) by mouth every 12 (twelve) hours. 20 tablet 0 11/21/2015 at Unknown time  . esomeprazole (NEXIUM) 40 MG capsule Take 1 capsule (40 mg total) by mouth 2 (two) times daily. (Patient not taking: Reported on 11/21/2015) 1 capsule 0 Not Taking at Unknown time  . fluticasone (FLONASE) 50 MCG/ACT nasal spray Place 2 sprays into both nostrils daily as needed for allergies.    Past Week at Unknown time  . furosemide (LASIX) 40 MG tablet Take 1 tablet (40 mg total) by mouth daily as needed for fluid or edema. 30 tablet 0 Past Week at Unknown time  . hydrochlorothiazide (HYDRODIURIL) 25 MG tablet Take 25 mg by mouth daily.  0 11/21/2015 at am  . hydrOXYzine (ATARAX/VISTARIL) 25 MG tablet Take 1 tablet (25 mg total) by mouth every 6 (six) hours. 12 tablet 0 Past Week at Unknown time  . ipratropium (ATROVENT) 0.02 % nebulizer solution Take 0.5 mg by nebulization every 6 (six) hours as needed for wheezing or shortness of breath.   Past Week at Unknown time   . lamoTRIgine (LAMICTAL) 200 MG tablet Take 1 tablet (200 mg total) by mouth daily. 30 tablet 0 11/21/2015 at am  . levETIRAcetam (KEPPRA) 500 MG tablet Take 1 tablet (500 mg total) by mouth every 12 (twelve) hours.   11/21/2015 at am  . metFORMIN (GLUCOPHAGE) 1000 MG tablet Take 0.5 tablets (500 mg total)  by mouth daily with breakfast. (Patient taking differently: Take 500 mg by mouth 2 (two) times daily with a meal. )   11/21/2015 at am  . omeprazole (PRILOSEC) 20 MG capsule Take 20 mg by mouth 2 (two) times daily.  0 11/21/2015 at am  . oxyCODONE-acetaminophen (PERCOCET) 10-325 MG tablet Take 1 tablet by mouth every 6 (six) hours as needed for pain.   11/12/2015 at am  . potassium chloride SA (K-DUR,KLOR-CON) 20 MEQ tablet Take 1 tablet (20 mEq total) by mouth daily. 30 tablet 0 11/21/2015 at am  . pregabalin (LYRICA) 100 MG capsule Take 100-200 mg by mouth 3 (three) times daily as needed (for shooting pains in feet).    11/21/2015 at am  . sertraline (ZOLOFT) 100 MG tablet Take 1 tablet (100 mg total) by mouth daily. 1 tablet 0 11/21/2015 at am  . tamsulosin (FLOMAX) 0.4 MG CAPS capsule Take 1 capsule (0.4 mg total) by mouth daily after breakfast. 30 capsule  11/21/2015 at Unknown time  . [DISCONTINUED] lisinopril (ZESTRIL) 2.5 MG tablet Take 1 tablet (2.5 mg total) by mouth daily. 30 tablet 0 11/21/2015 at am  . [DISCONTINUED] metoprolol tartrate (LOPRESSOR) 25 MG tablet Take 0.5 tablets (12.5 mg total) by mouth 2 (two) times daily.   11/21/2015 at 1400    Patient Stressors: Financial difficulties Loss of support; moving locations recently Substance abuse  Patient Strengths: Ability for insight Average or above average intelligence Communication skills General fund of knowledge  Treatment Modalities: Medication Management, Group therapy, Case management,  1 to 1 session with clinician, Psychoeducation, Recreational therapy.   Physician Treatment Plan for Primary Diagnosis: Cocaine dependence,  continuous ([employer]) Long Term Goal(s): Improvement in symptoms so as ready for discharge Improvement in symptoms so as ready for discharge   Short Term Goals: Ability to identify changes in lifestyle to reduce recurrence of condition will improve Ability to demonstrate self-control will improve Ability to identify changes in lifestyle to reduce recurrence of condition will improve Ability to identify and develop effective coping behaviors will improve  Medication Management: Evaluate patient's response, side effects, and tolerance of medication regimen.  Therapeutic Interventions: 1 to 1 sessions, Unit Group sessions and Medication administration.  Evaluation of Outcomes: Progressing  Physician Treatment Plan for Secondary Diagnosis: Principal Problem:   Cocaine dependence, continuous ([place]) Active Problems:   Psychoactive substance-induced mood disorder (HCC)   MDD (major depressive disorder), recurrent episode, moderate ([place])  Long Term Goal(s): Improvement in symptoms so as ready for discharge Improvement in symptoms so as ready for discharge   Short Term Goals: Ability to identify changes in lifestyle to reduce recurrence of condition will improve Ability to demonstrate self-control will improve Ability to identify changes in lifestyle to reduce recurrence of condition will improve Ability to identify and develop effective coping behaviors will improve     Medication Management: Evaluate patient's response, side effects, and tolerance of medication regimen.  Therapeutic Interventions: 1 to 1 sessions, Unit Group sessions and Medication administration.  Evaluation of Outcomes: Progressing   RN Treatment Plan for Primary Diagnosis: Cocaine dependence, continuous ([place]) Long Term Goal(s): Knowledge of disease and therapeutic regimen to maintain health will improve  Short Term Goals: Ability to remain free from injury will improve, Ability to disclose and discuss suicidal ideas,  Ability to identify and develop effective coping behaviors will improve and Compliance with prescribed medications will improve  Medication Management: RN will administer medications as ordered by provider, will assess and evaluate patient's response and provide  education to patient for prescribed medication. RN will report any adverse and/or side effects to prescribing provider.  Therapeutic Interventions: 1 on 1 counseling sessions, Psychoeducation, Medication administration, Evaluate responses to treatment, Monitor vital signs and CBGs as ordered, Perform/monitor CIWA, COWS, AIMS and Fall Risk screenings as ordered, Perform wound care treatments as ordered.  Evaluation of Outcomes: Progressing   LCSW Treatment Plan for Primary Diagnosis: Cocaine dependence, continuous ([place]) Long Term Goal(s): Safe transition to appropriate next level of care at discharge, Engage patient in therapeutic group addressing interpersonal concerns.  Short Term Goals: Engage patient in aftercare planning with referrals and resources, Increase social support, Increase emotional regulation, Identify triggers associated with mental health/substance abuse issues and Increase skills for wellness and recovery  Therapeutic Interventions: Assess for all discharge needs, 1 to 1 time with Social worker, Explore available resources and support systems, Assess for adequacy in community support network, Educate family and significant other(s) on suicide prevention, Complete Psychosocial Assessment, Interpersonal group therapy.  Evaluation of Outcomes: Progressing   Progress in Treatment :  Attending groups: Intermittently   Participating in groups: Yes, when he attends  Taking medication as prescribed: Yes,   Toleration medication: Yes  Family/Significant other contact made: CSW spoke w son who is not supportive  Patient understands diagnosis: Yes  Discussing patient identified problems/goals with staff:  Yes  Medical problems stabilized or resolved: Yes  Denies suicidal/homicidal ideation: Treatment team continuing to asses  Issues/concerns per patient self-inventory: None reported  Other: N/A  New problem(s) identified: None reported at this time    New Short Term/Long Term Goal(s): None at this time    Discharge Plan or Barriers: Patient is homeless and denies any social supports. He is interested in residential treatment. 10/9:  Has been referred to multiple providers, accepted at [facility] in [place], pt declined this referral. Wants to go to [facility] which has no beds.  States he does not want to go to [facility], has been declined at various treatment facilities per [employer].  Requested referral to [place].  REferral will be made today.     Reason for Continuation of Hospitalization: Anxiety Depression Medication stabilization Suicidal Ideations  Estimated Length of Stay:1-3 days     Attendees:  Patient:  Physician: Dr. Sharolyn Douglas, MD 12/08/2015 12:40 PM   Nursing: Coralyn Mark RN;  Gaylan Gerold RN10/16/2017 12:40 PM   RN Care Manager:   Social Workers: Maxie Better, [place] 12/08/2015 12:40 PM   Nurse Pratictioners: Ricky Ala, NP 12/08/2015 12:40 PM   Other:10/2/20179:30am   Scribe for Treatment Team: Maxie Better, MSW, LCSW Clinical Social Worker 12/08/2015 12:40 PM

## 2015-12-08 NOTE — Progress Notes (Signed)
CSW spoke with Dalton [name] at the [facility] who verified that they are willing to accept patient for tomorrow. Pt notified by CSW and plans to take the bus after lunch.   Maxie Better, MSW, LCSW Clinical Social Worker 12/08/2015 4:10 PM

## 2015-12-08 NOTE — Progress Notes (Signed)
Patient ID: Dalton [name], male   DOB: December 16, 1960, 55 y.o.   MRN: [id number] D: Client visible on the unit, animated, interacting with peers and staff. "day been up and down, planning to go to [facility], transitional housing" "ready to get back to the new me" Client reports "I'm ready for change" "sick and tired of being tired" A: Writer provided emotional support, encouraged client to keep moving forward to sobriety. Medications reviewed and administered as ordered. Staff will monitor q49min for safety. R: Client is safe on the unit, attended group.

## 2015-12-08 NOTE — Progress Notes (Signed)
DAR NOTE: Patient calm and pleasant on approach.  Reports suicidal thoughts on self inventory form but contracts for safety.  Complain of lower back pain and received Lidocaine patch as prescribed.  Denies auditory and visual hallucinations.  Described energy level as normal and concentration as good.  Rates depression at 8, hopelessness at 8, and anxiety at 8.  Maintained on routine safety checks.  Medications given as prescribed.  Support and encouragement offered as needed.  Attended group and participated.  States goal for today is "getting in a program."  Patient observed socializing with peers in the dayroom.  Patient preoccupied with discharge plan and arrangement for placement.

## 2015-12-08 NOTE — Progress Notes (Signed)
[facility] MD Progress Note  12/08/2015 1:58 PM  Patient Active Problem List   Diagnosis Date Noted  . MDD (major depressive disorder), recurrent episode, moderate ([place]) 11/30/2015  . Cocaine dependence, continuous ([place]) 11/27/2015  . 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 Chest pain 11/12/2015  . Type 2 diabetes mellitus with vascular disease ([place]) 11/12/2015  . History of seizure 11/12/2015  . Cocaine abuse 01/26/2015  . Cocaine-induced mood disorder ([place]) 01/26/2015  . Helicobacter pylori gastritis 05/30/2012  . Pure hypercholesterolemia 08/31/2011  . Essential hypertension, benign 08/31/2011  . Postsurgical aortocoronary bypass status 08/31/2011  . PAD (peripheral artery disease) ([place]) 08/31/2011  . GERD (gastroesophageal reflux disease) 12/07/2010  . Esophageal dysphagia 12/07/2010  . Constipation 12/07/2010  . Laryngopharyngeal reflux (LPR) 12/07/2010  . Lumbar pain with radiation down left leg 11/17/2010  . CARPAL TUNNEL SYNDROME 07/14/2009  . SHOULDER IMPINGEMENT SYNDROME, LEFT 05/05/2009    Diagnosis: Cocaine use disorder, severe possible antisocial traits/PD  Subjective: Patient reports he was accepted at the program he was trying to get into and he feels good about it. He denies any acute suicidal or homicidal ideation, plan or intent. He asked for a trial of increasing the risperidone to 1 mg by mouth twice a day.   Objective: Well developed well nourished man in no apparent distress pleasant and appropriate age and motor within normal limits thought processes linear and goal-directed thought content denies acute suicidal or homicidal ideation, plan or intent mood is described as all right and affect is euthymic alert and oriented 3 insight and judgment are limited IQ appears an average range   Current Facility-Administered Medications (Endocrine & Metabolic):  .  metFORMIN (GLUCOPHAGE) tablet 500 mg  Current Outpatient Prescriptions (Endocrine &  Metabolic):  .  metFORMIN (GLUCOPHAGE) 500 MG tablet, Take 1 tablet (500 mg total) by mouth 2 (two) times daily with a meal.  Current Facility-Administered Medications (Cardiovascular):  .  atorvastatin (LIPITOR) tablet 10 mg .  hydrochlorothiazide (HYDRODIURIL) tablet 25 mg .  lisinopril (PRINIVIL,ZESTRIL) tablet 2.5 mg .  metoprolol tartrate (LOPRESSOR) tablet 12.5 mg  Current Outpatient Prescriptions (Cardiovascular):  .  atorvastatin (LIPITOR) 10 MG tablet, Take 1 tablet (10 mg total) by mouth every morning. .  hydrochlorothiazide (HYDRODIURIL) 25 MG tablet, Take 1 tablet (25 mg total) by mouth daily. [name] Kitchen  lisinopril (ZESTRIL) 2.5 MG tablet, Take 1 tablet (2.5 mg total) by mouth daily. .  metoprolol tartrate (LOPRESSOR) 25 MG tablet, Take 0.5 tablets (12.5 mg total) by mouth 2 (two) times daily.  Current Facility-Administered Medications (Respiratory):  .  albuterol (PROVENTIL HFA;VENTOLIN HFA) 108 (90 Base) MCG/ACT inhaler 2 puff .  fluticasone (FLONASE) 50 MCG/ACT nasal spray 2 spray .  fluticasone (FLOVENT HFA) 44 MCG/ACT inhaler 2 puff .  ipratropium (ATROVENT) nebulizer solution 0.5 mg .  loratadine (CLARITIN) tablet 10 mg   Current Facility-Administered Medications (Analgesics):  .  acetaminophen (TYLENOL) tablet 650 mg   Current Facility-Administered Medications (Hematological):  .  clopidogrel (PLAVIX) tablet 75 mg  Current Outpatient Prescriptions (Hematological):  .  clopidogrel (PLAVIX) 75 MG tablet, Take 1 tablet (75 mg total) by mouth daily before breakfast.  Current Facility-Administered Medications (Other):  .  alum & mag hydroxide-simeth (MAALOX/MYLANTA) 200-200-20 MG/5ML suspension 30 mL .  hydrOXYzine (ATARAX/VISTARIL) tablet 25 mg .  hydrOXYzine (ATARAX/VISTARIL) tablet 50 mg .  levETIRAcetam (KEPPRA) tablet 500 mg .  lidocaine (LIDODERM) 5 % 1 patch .  LORazepam (ATIVAN) tablet 2 mg .  magnesium hydroxide (MILK OF  MAGNESIA) suspension 30 mL .   pantoprazole (PROTONIX) EC tablet 40 mg .  potassium chloride SA (K-DUR,KLOR-CON) CR tablet 20 mEq .  pregabalin (LYRICA) capsule 100 mg .  risperiDONE (RISPERDAL M-TABS) disintegrating tablet 1 mg .  sertraline (ZOLOFT) tablet 150 mg .  tamsulosin (FLOMAX) capsule 0.4 mg  Current Outpatient Prescriptions (Other):  .  hydrOXYzine (ATARAX/VISTARIL) 25 MG tablet, Take 1 tablet (25 mg total) by mouth every 6 (six) hours as needed for anxiety. .  levETIRAcetam (KEPPRA) 500 MG tablet, Take 1 tablet (500 mg total) by mouth every 12 (twelve) hours. .  pantoprazole (PROTONIX) 40 MG tablet, Take 1 tablet (40 mg total) by mouth daily. .  potassium chloride SA (K-DUR,KLOR-CON) 20 MEQ tablet, Take 1 tablet (20 mEq total) by mouth daily. .  pregabalin (LYRICA) 100 MG capsule, Take 1 capsule (100 mg total) by mouth 3 (three) times daily. .  sertraline (ZOLOFT) 50 MG tablet, Take 3 tablets (150 mg total) by mouth daily. .  tamsulosin (FLOMAX) 0.4 MG CAPS capsule, Take 1 capsule (0.4 mg total) by mouth daily after breakfast.  Vital Signs:Blood pressure 124/75, pulse 81, temperature 97.7 F (36.5 C), temperature source Oral, resp. rate 16, height 5\' 10"  (1.778 m), weight 103.5 kg (228 lb 2.8 oz), SpO2 100 %.    Lab Results:  Results for orders placed or performed during the hospital encounter of 11/22/15 (from the past 48 hour(s))  Glucose, capillary     Status: Abnormal   Collection Time: 12/07/15  5:51 AM  Result Value Ref Range   Glucose-Capillary 106 (H) 65 - 99 mg/dL   Comment 1 Notify RN    Comment 2 Document in Chart   Glucose, capillary     Status: None   Collection Time: 12/08/15  5:31 AM  Result Value Ref Range   Glucose-Capillary 96 65 - 99 mg/dL    Physical Findings: AIMS: Facial and Oral Movements Muscles of Facial Expression: None, normal Lips and Perioral Area: None, normal Jaw: None, normal Tongue: None, normal,Extremity Movements Upper (arms, wrists, hands, fingers): None,  normal Lower (legs, knees, ankles, toes): None, normal, Trunk Movements Neck, shoulders, hips: None, normal, Overall Severity Severity of abnormal movements (highest score from questions above): None, normal Incapacitation due to abnormal movements: None, normal Patient's awareness of abnormal movements (rate only patient's report): No Awareness, Dental Status Current problems with teeth and/or dentures?: No Does patient usually wear dentures?: No  CIWA:  CIWA-Ar Total: 1 COWS:  COWS Total Score: 1   Assessment/Plan: Patient reports he is pleased with his current disposition. He has been known to have trouble separating from the institution and he should still have transportation off the property, security escort and be offered a when necessary Ativan upon discharge to reduce risk of acting out Dalton Millers, MD 12/08/2015, 1:58 PM

## 2015-12-08 NOTE — BHH Group Notes (Signed)
[facility] LCSW Aftercare Discharge Planning Group Note   12/08/2015 12:38 PM  Participation Quality:  Invited. DID NOT ATTEND.   [employer]

## 2015-12-08 NOTE — Progress Notes (Signed)
DAR NOTE:  PPD skin test result read on 12/05/15 by P. Eulas Post, RN.  Result is zero induration, no redness or swelling at the test site.  Negative result.

## 2015-12-08 NOTE — Progress Notes (Addendum)
PPD results faxed to [facility] at [facility] [phone]) per pt request.  Maxie Better, MSW, LCSW Clinical Social Worker 12/08/2015 12:10 PM   Also, pt was instructed to contact Satira Sark ([employer]) for phone interview 12/08/2015.   Maxie Better, MSW, LCSW Clinical Social Worker 12/08/2015 12:41 PM

## 2015-12-09 LAB — GLUCOSE, CAPILLARY: Glucose-Capillary: 100 mg/dL — ABNORMAL HIGH (ref 65–99)

## 2015-12-09 MED ORDER — METOPROLOL TARTRATE 25 MG PO TABS: 12.5000 mg | ORAL_TABLET | Freq: Two times a day (BID) | ORAL | 0 refills | Status: DC

## 2015-12-09 MED ORDER — PREGABALIN 100 MG PO CAPS: 100.0000 mg | ORAL_CAPSULE | Freq: Three times a day (TID) | ORAL | 0 refills | Status: DC

## 2015-12-09 MED ORDER — CLOPIDOGREL BISULFATE 75 MG PO TABS: 75.0000 mg | ORAL_TABLET | Freq: Every day | ORAL | 0 refills | Status: DC

## 2015-12-09 MED ORDER — RISPERIDONE 1 MG PO TBDP: 1.0000 mg | ORAL_TABLET | Freq: Two times a day (BID) | ORAL | 0 refills | Status: DC

## 2015-12-09 MED ORDER — HYDROXYZINE HCL 50 MG PO TABS: 50.0000 mg | ORAL_TABLET | Freq: Every evening | ORAL | 0 refills | Status: DC | PRN

## 2015-12-09 MED ORDER — SERTRALINE HCL 50 MG PO TABS: 150.0000 mg | ORAL_TABLET | Freq: Every day | ORAL | 0 refills | Status: DC

## 2015-12-09 MED ORDER — HYDROCHLOROTHIAZIDE 25 MG PO TABS: 25.0000 mg | ORAL_TABLET | Freq: Every day | ORAL | 0 refills | Status: DC

## 2015-12-09 MED ORDER — LISINOPRIL 2.5 MG PO TABS: 2.5000 mg | ORAL_TABLET | Freq: Every day | ORAL | 0 refills | Status: DC

## 2015-12-09 MED ORDER — LEVETIRACETAM 500 MG PO TABS: 500.0000 mg | ORAL_TABLET | Freq: Two times a day (BID) | ORAL | 0 refills | Status: DC

## 2015-12-09 NOTE — Progress Notes (Signed)
  [facility] Adult Case Management Discharge Plan :  Will you be returning to the same living situation after discharge:  No. Pt being admitted to [facility].  At discharge, do you have transportation home?: Yes,  bus pass Do you have the ability to pay for your medications: Yes,  managed medicare  Release of information consent forms completed and submitted to medical records by CSW. Patient to Follow up at: Follow-up Information    [facility] Follow up on 12/09/2015.   Why:  Therapy appointment on 10/17 at 10 AM-Social worker left message Tuesday morning in attempt to reschedule. Please call to reschedule if the office does not call back by discharge time. Bring hospital discharge paperwork to appointment. Thank you. Contact information: [place]  [address] Phone:  [phone] Fax:  [phone]       [facility] Follow up on 12/18/2015.   Why:  Medications management appointment on 10/26 at 2:10 PM.  Please call to cancel/reschedule if needed.  Bring hospital discharge paperwork to this appointment.   Contact information: [place]   Ph:  [phone] Fax:  [phone]          Next level of care provider has access to [place] and Suicide Prevention discussed: Yes,  SPE completd with pt's son. SPI pamphlet and Mobile crisis information also provided to pt.  Have you used any form of tobacco in the last 30 days? (Cigarettes, Smokeless Tobacco, Cigars, and/or Pipes): No  Has patient been referred to the Quitline?: Patient refused referral  Patient has been referred for addiction treatment: Yes  Dalton [name] N Smart LCSW 12/09/2015, 9:15 AM

## 2015-12-09 NOTE — Progress Notes (Signed)
CSW left message for pt's care coordinator: Kathlee Nations [facility]) [phone] informing her of pt's contact number and disposition.  Maxie Better, MSW, LCSW Clinical Social Worker 12/09/2015 10:19 AM

## 2015-12-09 NOTE — Discharge Summary (Signed)
Physician Discharge Summary Note  Patient:  Dalton [name] is an 55 y.o., male MRN:  [id number] DOB:  [birth year] Patient phone:  [phone] (home)  Patient address:   [place] [address],  Total Time spent with patient: 45 minutes  Date of Admission:  11/22/2015 Date of Discharge: 12/09/15  Reason for Admission:   55 yo M with depression, CAD, hypertension, DM, who was transferred to [facility] for SI after admission for medical evaluation of chest pain in the context of cocaine use.   Patient states that he has had SI with plan to blow his head off for a month. He lives with his friend and this friend took out a Chiropractor. He reports no significant change in his SI, although he is able to contract safety in the unit. He feels that he wants to "sleep and not wake up." He feels that those thoughts are getting worse since the last month. He has been overwhelmed by his cardiac issues and his chronic pain. He has seen his psychiatrist a couple of months ago to "get straight" and has been taking sertraline.   He endorses insomnia. He denies HI. He denies AH/VH. He uses cocaine "every now and then" ; last use a few days ago. He denies any recreational use, but he did it when he was with "wrong people." He denies alcohol use.   Principal Problem: Cocaine dependence, continuous [facility]) Discharge Diagnoses: Patient Active Problem List   Diagnosis Date Noted  . MDD (major depressive disorder), recurrent episode, moderate ([place]) [F33.1] 11/30/2015    Priority: High  . Cocaine dependence, continuous ([place]) [F14.20] 11/27/2015    Priority: High  . Psychoactive substance-induced mood disorder ([place]) [id number], F06.30] 11/22/2015    Priority: High  . Cocaine-induced mood disorder [facility]) [F14.94] 01/26/2015    Priority: High  . Chest pain [R07.9] 11/12/2015  . Type 2 diabetes mellitus with vascular disease ([place]) [E11.59] 11/12/2015  . History of seizure [Z87.898] 11/12/2015  .  Cocaine abuse [F14.10] 01/26/2015  . Helicobacter pylori gastritis [K29.70, B96.81] 05/30/2012  . Pure hypercholesterolemia [E78.00] 08/31/2011  . Essential hypertension, benign [I10] 08/31/2011  . 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 PAD (peripheral artery disease) ([place]) [I73.9] 08/31/2011  . GERD (gastroesophageal reflux disease) [K21.9] 12/07/2010  . Esophageal dysphagia [R13.10] 12/07/2010  . Constipation [K59.00] 12/07/2010  . Laryngopharyngeal reflux (LPR) [K21.9] 12/07/2010  . Lumbar pain with radiation down left leg [M54.5] 11/17/2010  . CARPAL TUNNEL SYNDROME [G56.00] 07/14/2009  . SHOULDER IMPINGEMENT SYNDROME, LEFT [M75.80] 05/05/2009    Past Psychiatric History: MDD, substance abuse  Past Medical History:  Past Medical History:  Diagnosis Date  . Anemia   . Arthritis   . Bronchitis, chronic ([place])   . CHF (congestive heart failure) ([place])   . Chronic back pain    Pain Clinic in [place]  . Chronic bronchitis   . Congestive heart failure (CHF) ([place])   . Coronary artery disease   . Depression   . Diabetes mellitus without complication ([place])    TYPE 2  . Frequency of urination   . GERD (gastroesophageal reflux disease)   . Headache(784.0)   . High cholesterol   . Hypertension   . Laceration of right hand 11/27/10  . Laceration of wrist 2007 BIL FOREARMS  . MI (myocardial infarction)    7, last one was in 2011  . PUD (peptic ulcer disease)    in 1990s, secondary to medication  . Seizures ([place])  entire life, last seizure in 2011;unknown etiology-pt sts heriditary  . Shortness of breath    with exertion  . Tonsillitis, chronic    Dr. Vicki Mallet in [place]    Past Surgical History:  Procedure Laterality Date  . BACK SURGERY     3-  . BIOPSY N/A 05/30/2012   Procedure: BIOPSY;  Surgeon: Danie Binder, MD;  Location: [facility];  Service: Endoscopy;  Laterality: N/A;  . CARPAL TUNNEL RELEASE     bilateral  . COLONOSCOPY  12/28/10    SLF: (MAC)Internal hemorrhoids/four small colon polyps  . CORONARY ARTERY BYPASS GRAFT  2002   3 vessels  . ESOPHAGOGASTRODUODENOSCOPY N/A 05/30/2012   SLF: UNCONTROLLED GERD DUE TO LIFESTYLE CHOICE/WEIGHT GAIN/MILD Non-erosive gastritis  . KNEE SURGERY     left-plate to left knee cap from accident  . LEFT HEART CATHETERIZATION WITH CORONARY ANGIOGRAM N/A 08/31/2011   Procedure: LEFT HEART CATHETERIZATION WITH CORONARY ANGIOGRAM;  Surgeon: Laverda Page, MD;  [facility] C[facility] LAB;  Service: Cardiovascular;  Laterality: N/A;  . SAVORY DILATION  12/28/2010   SLF:(MAC)J-shaped stomach/nodular mocosa in the distal esophagus/empiric dilation 40mm   Family History:  Family History  Problem Relation Age of Onset  . Diabetes Mother   . Hypertension Mother   . Heart attack Mother   . Hypertension Father   . Diabetes Father   . Heart attack Father   . Heart attack      mother, father, brother, sister all deceased due to MI  . Heart attack Sister   . Heart attack Brother   . Seizures Brother   . Heart failure Other   . Colon cancer Neg Hx   . Liver disease Neg Hx   . Anesthesia problems Neg Hx   . Hypotension Neg Hx   . Malignant hyperthermia Neg Hx   . Pseudochol deficiency Neg Hx   . Colon polyps Neg Hx    Family Psychiatric  History: MDD Social History:  History  Alcohol Use No    Comment: Occasions.     History  Drug Use  . Frequency: 1.0 time per week  . Types: "Crack" cocaine, Cocaine    Comment: history of cocaine, etoh, marijuana but denies any the last several years-12 yrs ago    Social History   Social History  . Marital status: Single    Spouse name: N/A  . Number of children: 2  . Years of education: N/A   Occupational History  . disabled Not Employed   Social History Main Topics  . Smoking status: Never Smoker  . Smokeless tobacco: Never Used     Comment: Never Smoked  . Alcohol use No     Comment: Occasions.  . Drug use:     Frequency: 1.0 time  per week    Types: "Crack" cocaine, Cocaine     Comment: history of cocaine, etoh, marijuana but denies any the last several years-12 yrs ago  . Sexual activity: No   Other Topics Concern  . None   Social History Narrative   Lives w/ son-23/22    Hospital Course:   AYBEL TUMA was admitted for Cocaine dependence, continuous ([place]) , and crisis management.  Pt was treated discharged with the medications listed below under Medication List.  Medical problems were identified and treated as needed.  Home medications were restarted as appropriate.  Improvement was monitored by observation and [name] 's daily report of symptom reduction.  Emotional and mental status  was monitored by daily self-inventory reports completed by [name] and clinical staff.         [name] was evaluated by the treatment team for stability and plans for continued recovery upon discharge. MARCAS BABCOCK 's motivation was an integral factor for scheduling further treatment. Employment, transportation, bed availability, health status, family support, and any pending legal issues were also considered during hospital stay. Pt was offered further treatment options upon discharge including but not limited to Residential, Intensive Outpatient, and Outpatient treatment.  EDLEY OH will follow up with the services as listed below under Follow Up Information.     [name] responded well to treatment with vistaril, keppra, lyrica, risperidone, and zoloft without adverse effects.  Pt demonstrated improvement without reported or observed adverse  effects to the point of stability appropriate for outpatient management. Pertinent labs include: UDS + benzo/cocaine. Reviewed CBC, CMP, BAL, and UDS; all unremarkable aside from noted exceptions.   Per Dr. Sharolyn Douglas upon discharge on 12/09/15: "Mr. Pick has been accepted at a program that he finds acceptable. Currently he endorses that he is  willing to go there and happy with this placement. We will of course provide security to ensure that he makes a successful transition. At present he does deny any suicidal or homicidal ideation, plan or intent."  Physical Findings: AIMS: Facial and Oral Movements Muscles of Facial Expression: None, normal Lips and Perioral Area: None, normal Jaw: None, normal Tongue: None, normal,Extremity Movements Upper (arms, wrists, hands, fingers): None, normal Lower (legs, knees, ankles, toes): None, normal, Trunk Movements Neck, shoulders, hips: None, normal, Overall Severity Severity of abnormal movements (highest score from questions above): None, normal Incapacitation due to abnormal movements: None, normal Patient's awareness of abnormal movements (rate only patient's report): No Awareness, Dental Status Current problems with teeth and/or dentures?: Yes Does patient usually wear dentures?: Yes  CIWA:  CIWA-Ar Total: 1 COWS:  COWS Total Score: 1  Musculoskeletal: Strength & Muscle Tone: within normal limits Gait & Station: normal Patient leans: N/A  Psychiatric Specialty Exam: Physical Exam  Review of Systems  Psychiatric/Behavioral: Positive for depression and substance abuse. Negative for hallucinations and suicidal ideas. The patient is nervous/anxious and has insomnia.   All other systems reviewed and are negative.   Blood pressure 120/70, pulse 72, temperature 98.1 F (36.7 C), temperature source Oral, resp. rate 16, height 5\' 10"  (1.778 m), weight 103.5 kg (228 lb 2.8 oz), SpO2 100 %.Body mass index is 32.74 kg/m.  SEE MD PSE within the [place]   Have you used any form of tobacco in the last 30 days? (Cigarettes, Smokeless Tobacco, Cigars, and/or Pipes): No  Has this patient used any form of tobacco in the last 30 days? (Cigarettes, Smokeless Tobacco, Cigars, and/or Pipes) No  Blood Alcohol level:  Lab Results  Component Value Date   ETH <5 11/21/2015   ETH <5 [id number]     Metabolic Disorder Labs:  Lab Results  Component Value Date   HGBA1C 5.3 11/14/2015   MPG 105 11/14/2015   MPG 123 10/24/2008   No results found for: PROLACTIN Lab Results  Component Value Date   CHOL  04/17/2010    185        ATP III CLASSIFICATION:  <200     mg/dL   Desirable  200-239  mg/dL   Borderline High  >=240    mg/dL   High          TRIG 75  04/17/2010   HDL 44 04/17/2010   CHOLHDL 4.2 04/17/2010   VLDL 15 04/17/2010   LDLCALC (H) 04/17/2010    126        Total Cholesterol/HDL:CHD Risk Coronary Heart Disease Risk Table                     Men   Women  1/2 Average Risk   3.4   3.3  Average Risk       5.0   4.4  2 X Average Risk   9.6   7.1  3 X Average Risk  23.4   11.0        Use the calculated Patient Ratio above and the CHD Risk Table to determine the patient's CHD Risk.        ATP III CLASSIFICATION (LDL):  <100     mg/dL   Optimal  100-129  mg/dL   Near or Above                    Optimal  130-159  mg/dL   Borderline  160-189  mg/dL   High  >190     mg/dL   Very High   LDLCALC (H) 10/24/2008    116        Total Cholesterol/HDL:CHD Risk Coronary Heart Disease Risk Table                     Men   Women  1/2 Average Risk   3.4   3.3  Average Risk       5.0   4.4  2 X Average Risk   9.6   7.1  3 X Average Risk  23.4   11.0        Use the calculated Patient Ratio above and the CHD Risk Table to determine the patient's CHD Risk.        ATP III CLASSIFICATION (LDL):  <100     mg/dL   Optimal  100-129  mg/dL   Near or Above                    Optimal  130-159  mg/dL   Borderline  160-189  mg/dL   High  >190     mg/dL   Very High    See Psychiatric Specialty Exam and Suicide Risk Assessment completed by Attending Physician prior to discharge.  Discharge destination:  Home  Is patient on multiple antipsychotic therapies at discharge:  No   Has Patient had three or more failed trials of antipsychotic monotherapy by history:   No  Recommended Plan for Multiple Antipsychotic Therapies: NA     Medication List    STOP taking these medications   alprazolam 2 MG tablet Commonly known as:  XANAX   beclomethasone 80 MCG/ACT inhaler Commonly known as:  QVAR   diclofenac sodium 1 % Gel Commonly known as:  VOLTAREN   doxycycline 100 MG tablet Commonly known as:  VIBRA-TABS   esomeprazole 40 MG capsule Commonly known as:  NEXIUM   fluticasone 50 MCG/ACT nasal spray Commonly known as:  FLONASE   furosemide 40 MG tablet Commonly known as:  LASIX   ipratropium 0.02 % nebulizer solution Commonly known as:  ATROVENT   lamoTRIgine 200 MG tablet Commonly known as:  LAMICTAL   oxyCODONE-acetaminophen 10-325 MG tablet Commonly known as:  PERCOCET     TAKE these medications     Indication  aspirin EC 81  MG tablet Take 1 tablet (81 mg total) by mouth daily.  Indication:  supplement   atorvastatin 10 MG tablet Commonly known as:  LIPITOR Take 1 tablet (10 mg total) by mouth every morning.  Indication:  Elevation of Both Cholesterol and Triglycerides in Blood   cetirizine 10 MG tablet Commonly known as:  ZYRTEC Take 10 mg by mouth daily as needed for allergies or rhinitis. Reported on 04/16/2015  Indication:  Hayfever   clopidogrel 75 MG tablet Commonly known as:  PLAVIX Take 1 tablet (75 mg total) by mouth daily before breakfast.  Indication:  Disease of the Peripheral Arteries   hydrochlorothiazide 25 MG tablet Commonly known as:  HYDRODIURIL Take 1 tablet (25 mg total) by mouth daily.  Indication:  High Blood Pressure Disorder   hydrOXYzine 50 MG tablet Commonly known as:  ATARAX/VISTARIL Take 1-2 tablets (50-100 mg total) by mouth at bedtime as needed (insomnia). What changed:  medication strength  how much to take  when to take this  reasons to take this  Indication:  insomnia   levETIRAcetam 500 MG tablet Commonly known as:  KEPPRA Take 1 tablet (500 mg total) by mouth every  12 (twelve) hours.  Indication:  seizures   lisinopril 2.5 MG tablet Commonly known as:  ZESTRIL Take 1 tablet (2.5 mg total) by mouth daily.  Indication:  High Blood Pressure Disorder   metFORMIN 1000 MG tablet Commonly known as:  GLUCOPHAGE Take 0.5 tablets (500 mg total) by mouth daily with breakfast. What changed:  when to take this  Indication:  Type 2 Diabetes   metoprolol tartrate 25 MG tablet Commonly known as:  LOPRESSOR Take 0.5 tablets (12.5 mg total) by mouth 2 (two) times daily.  Indication:  High Blood Pressure Disorder   omeprazole 20 MG capsule Commonly known as:  PRILOSEC Take 20 mg by mouth 2 (two) times daily.  Indication:  Gastroesophageal Reflux Disease   potassium chloride SA 20 MEQ tablet Commonly known as:  K-DUR,KLOR-CON Take 1 tablet (20 mEq total) by mouth daily.  Indication:  Low Amount of Potassium in the Blood   pregabalin 100 MG capsule Commonly known as:  LYRICA Take 1 capsule (100 mg total) by mouth 3 (three) times daily. What changed:  how much to take  when to take this  reasons to take this  Indication:  Generalized Anxiety Disorder, Neuropathic Pain   PROAIR HFA 108 (90 Base) MCG/ACT inhaler Generic drug:  albuterol Inhale 2 puffs into the lungs every 6 (six) hours as needed for wheezing (for shortness of breath). What changed:  Another medication with the same name was removed. Continue taking this medication, and follow the directions you see here.  Indication:  Asthma   risperiDONE 1 MG disintegrating tablet Commonly known as:  RISPERDAL M-TABS Take 1 tablet (1 mg total) by mouth 2 (two) times daily.  Indication:  mood stabilization   sertraline 50 MG tablet Commonly known as:  ZOLOFT Take 3 tablets (150 mg total) by mouth daily. What changed:  medication strength  how much to take  Indication:  Major Depressive Disorder   tamsulosin 0.4 MG Caps capsule Commonly known as:  FLOMAX Take 1 capsule (0.4 mg total) by  mouth daily after breakfast.  Indication:  Benign Enlargement of Prostate      Follow-up Information    [facility] Follow up on 12/09/2015.   Why:  Therapy appointment on 10/17 at 10 AM-Social worker left message Tuesday morning in attempt to reschedule.  Please call to reschedule if the office does not call back by discharge time. Bring hospital discharge paperwork to appointment. Thank you. Contact information: [place]  [address] Phone:  [phone] Fax:  [phone]       [facility] Follow up on 12/18/2015.   Why:  Medications management appointment on 10/26 at 2:10 PM.  Please call to cancel/reschedule if needed.  Bring hospital discharge paperwork to this appointment.   Contact information: [place]   Ph:  [phone] Fax:  [phone]          Follow-up recommendations:  Activity:  As tolerated Diet:  Heart healthy with low sodim.  Comments:   Take all medications as prescribed. Keep all follow-up appointments as scheduled.  Do not consume alcohol or use illegal drugs while on prescription medications. Report any adverse effects from your medications to your primary care provider promptly.  In the event of recurrent symptoms or worsening symptoms, call 911, a crisis hotline, or go to the nearest emergency department for evaluation.   Signed: Benjamine Mola, FNP 12/09/2015, 10:09 AM

## 2015-12-09 NOTE — Progress Notes (Signed)
Data. Patient denies SI/HI/AVH. Affect bright on interaction. Patient reports looking forward to discharge today. Mood happy and, "A little nervous. But I am going to stay sober this time. I am going to do it". Patient interacting well with staff and other patients.  Action. Emotional support and encouragement offered. Education provided on medication, indications and side effect. Q 15 minute checks done for safety. Response. Safety on the unit maintained through 15 minute checks.  Medications taken as prescribed. Remained calm and appropriate through out shift.  Security present through out entire discharge process. Patient  discharged to lobby.  Belongings sheet reviewed and signed by patient and all belongings, including scripts, inhalers x 2 and sample medications, sent home. Paperwork reviewed and patient able to verbalize understanding of education. Patient in no current distress and ambulatory.

## 2015-12-09 NOTE — Tx Team (Signed)
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 acetaminophen (TYLENOL) tablet 650 mg  650 mg Oral Q6H PRN Laverle Hobby, PA-C   650 mg at 12/07/15 2200  . albuterol (PROVENTIL HFA;VENTOLIN HFA) 108 (90 Base) MCG/ACT inhaler 2 puff  2 puff Inhalation Q6H PRN Jenne Campus, MD   2 puff at 12/07/15 1115  . alum & mag hydroxide-simeth (MAALOX/MYLANTA) 200-200-20 MG/5ML suspension 30 mL  30 mL Oral Q4H PRN Laverle Hobby, PA-C      . atorvastatin (LIPITOR) tablet 10 mg  10 mg Oral q morning - 10a Laverle Hobby, PA-C   10 mg at 12/08/15 0825  . clopidogrel (PLAVIX) tablet 75 mg  75 mg Oral QAC breakfast Laverle Hobby, PA-C   75 mg at 12/09/15 9528  . fluticasone (FLONASE) 50 MCG/ACT nasal spray 2 spray  2 spray Each Nare Daily PRN Laverle Hobby, PA-C      . fluticasone (FLOVENT HFA) 44 MCG/ACT inhaler 2 puff  2 puff Inhalation BID Jenne Campus, MD   2 puff at 12/09/15 0817  . hydrochlorothiazide (HYDRODIURIL) tablet 25 mg  25 mg Oral Daily Laverle Hobby, PA-C   25 mg at 12/09/15 0818  . hydrOXYzine (ATARAX/VISTARIL) tablet 25 mg  25 mg Oral Q6H PRN Laverle Hobby, PA-C   25 mg at 12/07/15 2200  . hydrOXYzine (ATARAX/VISTARIL) tablet 50 mg  50 mg Oral QHS PRN,MR X 1 Rozetta Nunnery, NP   50 mg at 12/08/15 2222  . ipratropium (ATROVENT) nebulizer solution 0.5 mg  0.5 mg Nebulization Q6H PRN Laverle Hobby, PA-C   0.5 mg at 11/23/15 1558  . levETIRAcetam (KEPPRA) tablet 500 mg  500 mg Oral Q12H Maurine Minister Simon, PA-C   500 mg at 12/09/15 0818  . lidocaine (LIDODERM) 5 % 1 patch  1 patch Transdermal Daily Norman Clay, MD   1 patch at 12/09/15 0818  . lisinopril (PRINIVIL,ZESTRIL) tablet 2.5 mg  2.5 mg Oral Daily Laverle Hobby, PA-C   2.5 mg at 12/09/15 0818  . loratadine (CLARITIN) tablet 10 mg  10 mg Oral Daily Laverle Hobby, PA-C   10 mg at 12/09/15 0820  . LORazepam (ATIVAN) tablet 2 mg  2 mg Oral Once PRN Linard Millers, MD      . magnesium hydroxide (MILK OF MAGNESIA) suspension 30 mL  30 mL Oral Daily PRN Laverle Hobby, PA-C      . metFORMIN (GLUCOPHAGE) tablet 500 mg  500 mg Oral BID WC Laverle Hobby, PA-C   500 mg at 12/09/15 0818  . metoprolol tartrate (LOPRESSOR) tablet 12.5 mg  12.5 mg Oral BID Laverle Hobby, PA-C   12.5 mg at 12/09/15 0820  . pantoprazole (PROTONIX) EC tablet 40 mg  40 mg Oral Daily Laverle Hobby, PA-C   40 mg at 12/09/15 0818  . potassium chloride SA (K-DUR,KLOR-CON) CR tablet 20 mEq  20 mEq Oral Daily Laverle Hobby, PA-C   20 mEq at 12/09/15 0818  . pregabalin (LYRICA) capsule 100 mg  100 mg Oral TID Frederico Hamman  E Simon, PA-C   100 mg at 12/09/15 0818  . risperiDONE (RISPERDAL M-TABS) disintegrating tablet 1 mg  1 mg Oral BID Linard Millers, MD   1 mg at 12/09/15 0818  . sertraline (ZOLOFT) tablet 150 mg  150 mg Oral Daily Norman Clay, MD   150 mg at 12/09/15 0818  . tamsulosin (FLOMAX) capsule 0.4 mg  0.4 mg Oral QPC breakfast Laverle Hobby, PA-C   0.4 mg at 12/09/15 0818   PTA Medications: Prescriptions Prior to Admission  Medication Sig Dispense Refill Last Dose  . albuterol (PROAIR HFA) 108 (90 BASE) MCG/ACT inhaler Inhale 2 puffs into the lungs every 6 (six) hours as needed for wheezing (for shortness of breath).   Past Week at Unknown time  . albuterol (PROVENTIL) (2.5 MG/3ML) 0.083% nebulizer solution Take 3 mLs (2.5 mg total) by nebulization 2 (two) times daily. (Patient taking differently: Take 2.5 mg by nebulization 3 (three)  times daily. ) 75 mL  11/21/2015 at am  . alprazolam (XANAX) 2 MG tablet Take 2 mg by mouth 2 (two) times daily.   11/21/2015 at am  . aspirin EC 81 MG tablet Take 1 tablet (81 mg total) by mouth daily. 30 tablet 0 11/21/2015 at am  . atorvastatin (LIPITOR) 10 MG tablet Take 1 tablet (10 mg total) by mouth every morning.   11/21/2015 at am  . beclomethasone (QVAR) 80 MCG/ACT inhaler Inhale 2 puffs into the lungs 2 (two) times daily. (Patient taking differently: Inhale 2 puffs into the lungs 2 (two) times daily as needed. ) 1 Inhaler 12 Past Week at Unknown time  . cetirizine (ZYRTEC) 10 MG tablet Take 10 mg by mouth daily as needed for allergies or rhinitis. Reported on 04/16/2015   [facility] at [facility]  . clopidogrel (PLAVIX) 75 MG tablet Take 75 mg by mouth daily before breakfast.   11/21/2015 at am  . diclofenac sodium (VOLTAREN) 1 % GEL Place 1 application onto the skin 4 (four) times daily as needed.   Past Month at Unknown time  . doxycycline (VIBRA-TABS) 100 MG tablet Take 1 tablet (100 mg total) by mouth every 12 (twelve) hours. 20 tablet 0 11/21/2015 at Unknown time  . esomeprazole (NEXIUM) 40 MG capsule Take 1 capsule (40 mg total) by mouth 2 (two) times daily. (Patient not taking: Reported on 11/21/2015) 1 capsule 0 Not Taking at Unknown time  . fluticasone (FLONASE) 50 MCG/ACT nasal spray Place 2 sprays into both nostrils daily as needed for allergies.    Past Week at Unknown time  . furosemide (LASIX) 40 MG tablet Take 1 tablet (40 mg total) by mouth daily as needed for fluid or edema. 30 tablet 0 Past Week at Unknown time  . hydrochlorothiazide (HYDRODIURIL) 25 MG tablet Take 25 mg by mouth daily.  0 11/21/2015 at am  . hydrOXYzine (ATARAX/VISTARIL) 25 MG tablet Take 1 tablet (25 mg total) by mouth every 6 (six) hours. 12 tablet 0 Past Week at Unknown time  . ipratropium (ATROVENT) 0.02 % nebulizer solution Take 0.5 mg by nebulization every 6 (six) hours as needed for wheezing or shortness of breath.   Past  Week at Unknown time  . lamoTRIgine (LAMICTAL) 200 MG tablet Take 1 tablet (200 mg total) by mouth daily. 30 tablet 0 11/21/2015 at am  . levETIRAcetam (KEPPRA) 500 MG tablet Take 1 tablet (500 mg total) by mouth every 12 (twelve) hours.   11/21/2015 at am  . metFORMIN (GLUCOPHAGE) 1000 MG tablet Take  0.5 tablets (500 mg total) by mouth daily with breakfast. (Patient taking differently: Take 500 mg by mouth 2 (two) times daily with a meal. )   11/21/2015 at am  . omeprazole (PRILOSEC) 20 MG capsule Take 20 mg by mouth 2 (two) times daily.  0 11/21/2015 at am  . oxyCODONE-acetaminophen (PERCOCET) 10-325 MG tablet Take 1 tablet by mouth every 6 (six) hours as needed for pain.   11/12/2015 at am  . potassium chloride SA (K-DUR,KLOR-CON) 20 MEQ tablet Take 1 tablet (20 mEq total) by mouth daily. 30 tablet 0 11/21/2015 at am  . pregabalin (LYRICA) 100 MG capsule Take 100-200 mg by mouth 3 (three) times daily as needed (for shooting pains in feet).    11/21/2015 at am  . sertraline (ZOLOFT) 100 MG tablet Take 1 tablet (100 mg total) by mouth daily. 1 tablet 0 11/21/2015 at am  . tamsulosin (FLOMAX) 0.4 MG CAPS capsule Take 1 capsule (0.4 mg total) by mouth daily after breakfast. 30 capsule  11/21/2015 at Unknown time  . [DISCONTINUED] lisinopril (ZESTRIL) 2.5 MG tablet Take 1 tablet (2.5 mg total) by mouth daily. 30 tablet 0 11/21/2015 at am  . [DISCONTINUED] metoprolol tartrate (LOPRESSOR) 25 MG tablet Take 0.5 tablets (12.5 mg total) by mouth 2 (two) times daily.   11/21/2015 at 1400    Patient Stressors: Financial difficulties Loss of support; moving locations recently Substance abuse  Patient Strengths: Ability for insight Average or above average intelligence Communication skills General fund of knowledge  Treatment Modalities: Medication Management, Group therapy, Case management,  1 to 1 session with clinician, Psychoeducation, Recreational therapy.   Physician Treatment Plan for Primary Diagnosis:  Cocaine dependence, continuous ([place]) Long Term Goal(s): Improvement in symptoms so as ready for discharge Improvement in symptoms so as ready for discharge   Short Term Goals: Ability to identify changes in lifestyle to reduce recurrence of condition will improve Ability to demonstrate self-control will improve Ability to identify changes in lifestyle to reduce recurrence of condition will improve Ability to identify and develop effective coping behaviors will improve  Medication Management: Evaluate patient's response, side effects, and tolerance of medication regimen.  Therapeutic Interventions: 1 to 1 sessions, Unit Group sessions and Medication administration.  Evaluation of Outcomes: Met  Physician Treatment Plan for Secondary Diagnosis: Principal Problem:   Cocaine dependence, continuous ([place]) Active Problems:   Psychoactive substance-induced mood disorder ([facility])   MDD (major depressive disorder), recurrent episode, moderate ([facility])  Long Term Goal(s): Improvement in symptoms so as ready for discharge Improvement in symptoms so as ready for discharge   Short Term Goals: Ability to identify changes in lifestyle to reduce recurrence of condition will improve Ability to demonstrate self-control will improve Ability to identify changes in lifestyle to reduce recurrence of condition will improve Ability to identify and develop effective coping behaviors will improve     Medication Management: Evaluate patient's response, side effects, and tolerance of medication regimen.  Therapeutic Interventions: 1 to 1 sessions, Unit Group sessions and Medication administration.  Evaluation of Outcomes: Met   RN Treatment Plan for Primary Diagnosis: Cocaine dependence, continuous ([place]) Long Term Goal(s): Knowledge of disease and therapeutic regimen to maintain health will improve  Short Term Goals: Ability to remain free from injury will improve, Ability to disclose and discuss suicidal ideas,  Ability to identify and develop effective coping behaviors will improve and Compliance with prescribed medications will improve  Medication Management: RN will administer medications as ordered by provider, will assess and  evaluate patient's response and provide education to patient for prescribed medication. RN will report any adverse and/or side effects to prescribing provider.  Therapeutic Interventions: 1 on 1 counseling sessions, Psychoeducation, Medication administration, Evaluate responses to treatment, Monitor vital signs and CBGs as ordered, Perform/monitor CIWA, COWS, AIMS and Fall Risk screenings as ordered, Perform wound care treatments as ordered.  Evaluation of Outcomes: Progressing   LCSW Treatment Plan for Primary Diagnosis: Cocaine dependence, continuous ([place]) Long Term Goal(s): Safe transition to appropriate next level of care at discharge, Engage patient in therapeutic group addressing interpersonal concerns.  Short Term Goals: Engage patient in aftercare planning with referrals and resources, Increase social support, Increase emotional regulation, Identify triggers associated with mental health/substance abuse issues and Increase skills for wellness and recovery  Therapeutic Interventions: Assess for all discharge needs, 1 to 1 time with Social worker, Explore available resources and support systems, Assess for adequacy in community support network, Educate family and significant other(s) on suicide prevention, Complete Psychosocial Assessment, Interpersonal group therapy.  Evaluation of Outcomes: Met   Progress in Treatment :  Attending groups:Yes  Participating in groups: Yes  Taking medication as prescribed: Yes,   Toleration medication: Yes  Family/Significant other contact made: CSW spoke w son who is not supportive  Patient understands diagnosis: Yes  Discussing patient identified problems/goals with staff: Yes  Medical problems stabilized or resolved:  Yes  Denies suicidal/homicidal ideation: Treatment team continuing to asses  Issues/concerns per patient self-inventory: None reported  Other: N/A  New problem(s) identified: None reported at this time    New Short Term/Long Term Goal(s): None at this time    Discharge Plan or Barriers: Pt has been accepted to [facility] and has appt with [facility] for medication management. Fred Wilson-therapist: CSW left message 10/17 to reschedule appt. Pt made aware and will followup at discharge if appt not scheduled prior to his discharge.     Reason for Continuation of Hospitalization: None  Estimated Length of Stay: d/c today (bus pass in chart).     Attendees:  Patient:  Physician: Dr. Sharolyn Douglas, MD 12/09/2015 9:17 AM   Nursing: Precious Gilding RN  RN Care Manager: Lars Pinks CM  Social Workers: Maxie Better, LCSW 12/09/2015 9:17 AM   Nurse Pratictioners:   Other:   Scribe for Treatment Team: Maxie Better, MSW, LCSW Clinical Social Worker 12/09/2015 9:17 AM

## 2015-12-10 ENCOUNTER — Emergency Department (HOSPITAL_COMMUNITY): Payer: Medicare Other

## 2015-12-10 ENCOUNTER — Encounter (HOSPITAL_COMMUNITY): Payer: Self-pay | Admitting: Emergency Medicine

## 2015-12-10 ENCOUNTER — Emergency Department (HOSPITAL_COMMUNITY)
Admission: EM | Admit: 2015-12-10 | Discharge: 2015-12-12 | Disposition: A | Discharge status: Other Acute Inpatient Hospital | Payer: Medicare Other | Attending: Otolaryngology | Admitting: Otolaryngology

## 2015-12-10 DIAGNOSIS — R45851 Suicidal ideations: Secondary | ICD-10-CM | POA: Diagnosis not present

## 2015-12-10 DIAGNOSIS — Y999 Unspecified external cause status: Secondary | ICD-10-CM | POA: Diagnosis not present

## 2015-12-10 DIAGNOSIS — Z7984 Long term (current) use of oral hypoglycemic drugs: Secondary | ICD-10-CM | POA: Diagnosis not present

## 2015-12-10 DIAGNOSIS — Z833 Family history of diabetes mellitus: Secondary | ICD-10-CM | POA: Diagnosis not present

## 2015-12-10 DIAGNOSIS — I739 Peripheral vascular disease, unspecified: Secondary | ICD-10-CM | POA: Diagnosis present

## 2015-12-10 DIAGNOSIS — R45850 Homicidal ideations: Secondary | ICD-10-CM | POA: Diagnosis not present

## 2015-12-10 DIAGNOSIS — Y92009 Unspecified place in unspecified non-institutional (private) residence as the place of occurrence of the external cause: Secondary | ICD-10-CM | POA: Insufficient documentation

## 2015-12-10 DIAGNOSIS — I2510 Atherosclerotic heart disease of native coronary artery without angina pectoris: Secondary | ICD-10-CM | POA: Insufficient documentation

## 2015-12-10 DIAGNOSIS — S0993XA Unspecified injury of face, initial encounter: Secondary | ICD-10-CM | POA: Diagnosis present

## 2015-12-10 DIAGNOSIS — F1420 Cocaine dependence, uncomplicated: Secondary | ICD-10-CM | POA: Diagnosis present

## 2015-12-10 DIAGNOSIS — I110 Hypertensive heart disease with heart failure: Secondary | ICD-10-CM | POA: Insufficient documentation

## 2015-12-10 DIAGNOSIS — Y939 Activity, unspecified: Secondary | ICD-10-CM | POA: Diagnosis not present

## 2015-12-10 DIAGNOSIS — I252 Old myocardial infarction: Secondary | ICD-10-CM | POA: Diagnosis not present

## 2015-12-10 DIAGNOSIS — S02642A Fracture of ramus of left mandible, initial encounter for closed fracture: Secondary | ICD-10-CM | POA: Insufficient documentation

## 2015-12-10 DIAGNOSIS — S02672A Fracture of alveolus of left mandible, initial encounter for closed fracture: Secondary | ICD-10-CM

## 2015-12-10 DIAGNOSIS — E119 Type 2 diabetes mellitus without complications: Secondary | ICD-10-CM | POA: Insufficient documentation

## 2015-12-10 DIAGNOSIS — Z79899 Other long term (current) drug therapy: Secondary | ICD-10-CM | POA: Insufficient documentation

## 2015-12-10 DIAGNOSIS — I509 Heart failure, unspecified: Secondary | ICD-10-CM | POA: Diagnosis not present

## 2015-12-10 DIAGNOSIS — F331 Major depressive disorder, recurrent, moderate: Secondary | ICD-10-CM | POA: Diagnosis present

## 2015-12-10 DIAGNOSIS — Z951 Presence of aortocoronary bypass graft: Secondary | ICD-10-CM | POA: Insufficient documentation

## 2015-12-10 DIAGNOSIS — Z7982 Long term (current) use of aspirin: Secondary | ICD-10-CM | POA: Diagnosis not present

## 2015-12-10 DIAGNOSIS — I10 Essential (primary) hypertension: Secondary | ICD-10-CM | POA: Diagnosis present

## 2015-12-10 DIAGNOSIS — R51 Headache: Secondary | ICD-10-CM | POA: Diagnosis not present

## 2015-12-10 DIAGNOSIS — Z87898 Personal history of other specified conditions: Secondary | ICD-10-CM

## 2015-12-10 DIAGNOSIS — Z8249 Family history of ischemic heart disease and other diseases of the circulatory system: Secondary | ICD-10-CM | POA: Diagnosis not present

## 2015-12-10 DIAGNOSIS — E1159 Type 2 diabetes mellitus with other circulatory complications: Secondary | ICD-10-CM | POA: Diagnosis present

## 2015-12-10 DIAGNOSIS — K219 Gastro-esophageal reflux disease without esophagitis: Secondary | ICD-10-CM | POA: Diagnosis present

## 2015-12-10 LAB — CBC WITH DIFFERENTIAL/PLATELET
Basophils Absolute: 0 10*3/uL (ref 0.0–0.1)
Basophils Relative: 0 %
EOS ABS: 0.3 10*3/uL (ref 0.0–0.7)
Eosinophils Relative: 3 %
HCT: 37.5 % — ABNORMAL LOW (ref 39.0–52.0)
HEMOGLOBIN: 13.2 g/dL (ref 13.0–17.0)
LYMPHS ABS: 2.1 10*3/uL (ref 0.7–4.0)
LYMPHS PCT: 29 %
MCH: 28.9 pg (ref 26.0–34.0)
MCHC: 35.2 g/dL (ref 30.0–36.0)
MCV: 82.2 fL (ref 78.0–100.0)
MONOS PCT: 8 %
Monocytes Absolute: 0.6 10*3/uL (ref 0.1–1.0)
NEUTROS PCT: 60 %
Neutro Abs: 4.4 10*3/uL (ref 1.7–7.7)
Platelets: 190 10*3/uL (ref 150–400)
RBC: 4.56 MIL/uL (ref 4.22–5.81)
RDW: 14 % (ref 11.5–15.5)
WBC: 7.4 10*3/uL (ref 4.0–10.5)

## 2015-12-10 LAB — BASIC METABOLIC PANEL
Anion gap: 12 (ref 5–15)
BUN: 12 mg/dL (ref 6–20)
CHLORIDE: 98 mmol/L — AB (ref 101–111)
CO2: 27 mmol/L (ref 22–32)
CREATININE: 1 mg/dL (ref 0.61–1.24)
Calcium: 9.8 mg/dL (ref 8.9–10.3)
GFR calc Af Amer: >60 mL/min (ref >60)
GFR calc non Af Amer: >60 mL/min (ref >60)
GLUCOSE: 99 mg/dL (ref 65–99)
POTASSIUM: 4 mmol/L (ref 3.5–5.1)
SODIUM: 137 mmol/L (ref 135–145)

## 2015-12-10 LAB — ETHANOL: Alcohol, Ethyl (B): <5 mg/dL (ref <5)

## 2015-12-10 LAB — PROTIME-INR
INR: 1.08
PROTHROMBIN TIME: 14 s (ref 11.4–15.2)

## 2015-12-10 LAB — TROPONIN I: Troponin I: <0.03 ng/mL (ref <0.03)

## 2015-12-10 MED ORDER — PENICILLIN V POTASSIUM 500 MG PO TABS: 500.0000 mg | ORAL_TABLET | Freq: Four times a day (QID) | ORAL | 0 refills | Status: DC

## 2015-12-10 MED ORDER — HYDROCHLOROTHIAZIDE 25 MG PO TABS
25.0000 mg | ORAL_TABLET | Freq: Every day | ORAL | Status: DC
  Administered 2015-12-11 – 2015-12-12 (×2): 25 mg via ORAL
  Filled 2015-12-10 (×2): qty 1

## 2015-12-10 MED ORDER — METFORMIN HCL 500 MG PO TABS
500.0000 mg | ORAL_TABLET | Freq: Every day | ORAL | Status: DC
  Administered 2015-12-11 – 2015-12-12 (×2): 500 mg via ORAL
  Filled 2015-12-10 (×2): qty 1

## 2015-12-10 MED ORDER — CLOPIDOGREL BISULFATE 75 MG PO TABS
75.0000 mg | ORAL_TABLET | Freq: Every day | ORAL | Status: DC
  Administered 2015-12-11: 75 mg via ORAL
  Filled 2015-12-10 (×2): qty 1

## 2015-12-10 MED ORDER — LEVETIRACETAM 500 MG PO TABS
500.0000 mg | ORAL_TABLET | Freq: Two times a day (BID) | ORAL | Status: DC
  Administered 2015-12-10 – 2015-12-12 (×4): 500 mg via ORAL
  Filled 2015-12-10 (×4): qty 1

## 2015-12-10 MED ORDER — LISINOPRIL 2.5 MG PO TABS
2.5000 mg | ORAL_TABLET | Freq: Every day | ORAL | Status: DC
  Administered 2015-12-11 – 2015-12-12 (×2): 2.5 mg via ORAL
  Filled 2015-12-10 (×3): qty 1

## 2015-12-10 MED ORDER — HYDROCODONE-ACETAMINOPHEN 5-325 MG PO TABS
1.0000 | ORAL_TABLET | Freq: Once | ORAL | Status: AC
  Administered 2015-12-10: 1 via ORAL
  Filled 2015-12-10: qty 1

## 2015-12-10 MED ORDER — PROPARACAINE HCL 0.5 % OP SOLN
1.0000 [drp] | Freq: Once | OPHTHALMIC | Status: AC
  Administered 2015-12-10: 1 [drp] via OPHTHALMIC
  Filled 2015-12-10: qty 15

## 2015-12-10 MED ORDER — PENICILLIN V POTASSIUM 250 MG PO TABS
500.0000 mg | ORAL_TABLET | Freq: Four times a day (QID) | ORAL | Status: DC
  Administered 2015-12-10 – 2015-12-12 (×7): 500 mg via ORAL
  Filled 2015-12-10 (×8): qty 2

## 2015-12-10 MED ORDER — METOPROLOL TARTRATE 25 MG PO TABS
12.5000 mg | ORAL_TABLET | Freq: Two times a day (BID) | ORAL | Status: DC
  Administered 2015-12-10 – 2015-12-11 (×2): 12.5 mg via ORAL
  Filled 2015-12-10 (×4): qty 1

## 2015-12-10 MED ORDER — FLUORESCEIN SODIUM 1 MG OP STRP
1.0000 | ORAL_STRIP | Freq: Once | OPHTHALMIC | Status: AC
Start: 2015-12-10 — End: 2015-12-10
  Administered 2015-12-10: 1 via OPHTHALMIC
  Filled 2015-12-10: qty 1

## 2015-12-10 MED ORDER — ATORVASTATIN CALCIUM 10 MG PO TABS
10.0000 mg | ORAL_TABLET | Freq: Every morning | ORAL | Status: DC
  Administered 2015-12-11 – 2015-12-12 (×2): 10 mg via ORAL
  Filled 2015-12-10 (×2): qty 1

## 2015-12-10 MED ORDER — ASPIRIN EC 81 MG PO TBEC
81.0000 mg | DELAYED_RELEASE_TABLET | Freq: Every day | ORAL | Status: DC
  Administered 2015-12-11 – 2015-12-12 (×2): 81 mg via ORAL
  Filled 2015-12-10 (×2): qty 1

## 2015-12-10 MED ORDER — RISPERIDONE 1 MG PO TBDP
1.0000 mg | ORAL_TABLET | Freq: Two times a day (BID) | ORAL | Status: DC
  Administered 2015-12-10 – 2015-12-12 (×5): 1 mg via ORAL
  Filled 2015-12-10 (×6): qty 1

## 2015-12-10 NOTE — ED Notes (Signed)
Pt is refusing to get onto the stretcher and allow [place] to monitor his vital signs. Pt stated "when can I leave?"

## 2015-12-10 NOTE — ED Notes (Signed)
Pt waiting for social work, Jody from [facility] aware of consult.  Pt medicated for pain Pt would not allow this nurse to remove chest leads, instead he ripped them forcefully by the cords.  Pt aware of plan of care.
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
## 2015-12-10 NOTE — Progress Notes (Signed)
Patriciaann Clan, PA recommended inpatient criteria on 12/10/15. No appropriate beds at [facility].  Referrals to be sent out to the following hospitals: [name], [facility], [place], [place], [place].  CSW will continue to seek placement in am.  Verlon Setting, [place] Disposition staff 12/10/2015 11:16 PM
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
## 2015-12-10 NOTE — ED Notes (Signed)
Wheeled back to room from X-ray. Pt refused to sit in bed, pt in chair in room.

## 2015-12-10 NOTE — ED Notes (Signed)
SW at bedside.

## 2015-12-10 NOTE — ED Notes (Signed)
Pt at bedside talking to security.

## 2015-12-10 NOTE — Discharge Instructions (Addendum)
Go to the short stay unit at [facility] on Friday , 12/19/2015 at 9 AM for scheduled surgery on your left jaw. Don't eat or drink anything after midnight the night before. Take Tylenol as directed for pain. No Plavix and no aspirin until your surgery. Call any of the resources furnished to you for help with her drug problem
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
## 2015-12-10 NOTE — ED Notes (Signed)
TTS in process 

## 2015-12-11 ENCOUNTER — Encounter (HOSPITAL_COMMUNITY): Payer: Self-pay | Admitting: Family Medicine

## 2015-12-11 ENCOUNTER — Ambulatory Visit: Payer: Self-pay | Admitting: Otolaryngology

## 2015-12-11 DIAGNOSIS — Z833 Family history of diabetes mellitus: Secondary | ICD-10-CM | POA: Diagnosis not present

## 2015-12-11 DIAGNOSIS — Z8249 Family history of ischemic heart disease and other diseases of the circulatory system: Secondary | ICD-10-CM

## 2015-12-11 DIAGNOSIS — F1420 Cocaine dependence, uncomplicated: Secondary | ICD-10-CM

## 2015-12-11 DIAGNOSIS — S02642A Fracture of ramus of left mandible, initial encounter for closed fracture: Secondary | ICD-10-CM | POA: Diagnosis not present

## 2015-12-11 DIAGNOSIS — Z8489 Family history of other specified conditions: Secondary | ICD-10-CM

## 2015-12-11 DIAGNOSIS — Z808 Family history of malignant neoplasm of other organs or systems: Secondary | ICD-10-CM

## 2015-12-11 LAB — RAPID URINE DRUG SCREEN, HOSP PERFORMED
Amphetamines: NOT DETECTED
BENZODIAZEPINES: POSITIVE — AB
Barbiturates: NOT DETECTED
COCAINE: POSITIVE — AB
OPIATES: NOT DETECTED
Tetrahydrocannabinol: NOT DETECTED

## 2015-12-11 LAB — CBG MONITORING, ED: Glucose-Capillary: 93 mg/dL (ref 65–99)

## 2015-12-11 MED ORDER — ACETAMINOPHEN 325 MG PO TABS
650.0000 mg | ORAL_TABLET | ORAL | Status: DC | PRN
  Administered 2015-12-11 – 2015-12-12 (×5): 650 mg via ORAL
  Filled 2015-12-11 (×5): qty 2
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
## 2015-12-11 NOTE — ED Notes (Signed)
Pt eating soup and tolerating well.

## 2015-12-11 NOTE — ED Notes (Signed)
MD at bedside. 
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
## 2015-12-11 NOTE — ED Notes (Signed)
Pt states he has difficulty eating because of facial pain with chewing. Soup orderedfrom dietary.
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
## 2015-12-11 NOTE — [facility] Counselor (Signed)
The pt was referred to the following facilities:   [facility] Alison Stalling, [place], [facility], [facility] Triage Specialist [phone]

## 2015-12-11 NOTE — ED Notes (Signed)
Patient was given a ginger ale. And a regular diet was ordered.
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
## 2015-12-11 NOTE — ED Notes (Signed)
Pt BP 100/63. Metoprolol held.

## 2015-12-11 NOTE — ED Provider Notes (Deleted)
Resting comfortably. Denies complaint.   Orlie Dakin, MD 12/11/15 [phone]

## 2015-12-11 NOTE — ED Notes (Signed)
Pt oob to br with steady gait 

## 2015-12-11 NOTE — ED Notes (Signed)
[employer] was called in for patient.

## 2015-12-11 NOTE — H&P (Signed)
Patient seen and evaluated.  On plavix (just received Plavix this morning).  Called Dr. Constance Holster to inform him that patient on Plavix and just took it this morning. Admission cancelled as patient not to undergo surgery for 1 week.
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
## 2015-12-11 NOTE — ED Notes (Signed)
Pt awaiting TTS.

## 2015-12-11 NOTE — ED Notes (Signed)
Contact made with [facility] who state they do not have place for PT. [facility] contacted to follow up and will wait to hear. Dr. Winfred Leeds aware.

## 2015-12-12 ENCOUNTER — Inpatient Hospital Stay (HOSPITAL_COMMUNITY)
Admission: AD | Admit: 2015-12-12 | Discharge: 2015-12-18 | DRG: 885 | Disposition: A | Discharge status: Home / Self Care | Payer: Medicare Other | Attending: Psychiatry | Admitting: Psychiatry

## 2015-12-12 ENCOUNTER — Encounter (HOSPITAL_COMMUNITY): Payer: Self-pay | Admitting: Emergency Medicine

## 2015-12-12 ENCOUNTER — Encounter (HOSPITAL_COMMUNITY): Payer: Self-pay

## 2015-12-12 DIAGNOSIS — M545 Low back pain: Secondary | ICD-10-CM | POA: Diagnosis present

## 2015-12-12 DIAGNOSIS — Z590 Homelessness: Secondary | ICD-10-CM | POA: Diagnosis not present

## 2015-12-12 DIAGNOSIS — Z886 Allergy status to analgesic agent status: Secondary | ICD-10-CM | POA: Diagnosis not present

## 2015-12-12 DIAGNOSIS — X58XXXA Exposure to other specified factors, initial encounter: Secondary | ICD-10-CM | POA: Diagnosis not present

## 2015-12-12 DIAGNOSIS — G8929 Other chronic pain: Secondary | ICD-10-CM | POA: Diagnosis not present

## 2015-12-12 DIAGNOSIS — R569 Unspecified convulsions: Secondary | ICD-10-CM | POA: Diagnosis present

## 2015-12-12 DIAGNOSIS — Z23 Encounter for immunization: Secondary | ICD-10-CM

## 2015-12-12 DIAGNOSIS — F332 Major depressive disorder, recurrent severe without psychotic features: Principal | ICD-10-CM | POA: Diagnosis present

## 2015-12-12 DIAGNOSIS — K219 Gastro-esophageal reflux disease without esophagitis: Secondary | ICD-10-CM | POA: Diagnosis present

## 2015-12-12 DIAGNOSIS — Z951 Presence of aortocoronary bypass graft: Secondary | ICD-10-CM | POA: Diagnosis not present

## 2015-12-12 DIAGNOSIS — Z888 Allergy status to other drugs, medicaments and biological substances status: Secondary | ICD-10-CM | POA: Diagnosis not present

## 2015-12-12 DIAGNOSIS — Z8249 Family history of ischemic heart disease and other diseases of the circulatory system: Secondary | ICD-10-CM | POA: Diagnosis not present

## 2015-12-12 DIAGNOSIS — Z8711 Personal history of peptic ulcer disease: Secondary | ICD-10-CM

## 2015-12-12 DIAGNOSIS — R45851 Suicidal ideations: Secondary | ICD-10-CM | POA: Diagnosis present

## 2015-12-12 DIAGNOSIS — I110 Hypertensive heart disease with heart failure: Secondary | ICD-10-CM | POA: Diagnosis not present

## 2015-12-12 DIAGNOSIS — I2510 Atherosclerotic heart disease of native coronary artery without angina pectoris: Secondary | ICD-10-CM | POA: Diagnosis present

## 2015-12-12 DIAGNOSIS — F419 Anxiety disorder, unspecified: Secondary | ICD-10-CM | POA: Diagnosis present

## 2015-12-12 DIAGNOSIS — Z7984 Long term (current) use of oral hypoglycemic drugs: Secondary | ICD-10-CM | POA: Diagnosis not present

## 2015-12-12 DIAGNOSIS — F1420 Cocaine dependence, uncomplicated: Secondary | ICD-10-CM | POA: Diagnosis present

## 2015-12-12 DIAGNOSIS — I509 Heart failure, unspecified: Secondary | ICD-10-CM | POA: Diagnosis not present

## 2015-12-12 DIAGNOSIS — I739 Peripheral vascular disease, unspecified: Secondary | ICD-10-CM | POA: Diagnosis present

## 2015-12-12 DIAGNOSIS — Z9889 Other specified postprocedural states: Secondary | ICD-10-CM | POA: Diagnosis not present

## 2015-12-12 DIAGNOSIS — Z7902 Long term (current) use of antithrombotics/antiplatelets: Secondary | ICD-10-CM

## 2015-12-12 DIAGNOSIS — I10 Essential (primary) hypertension: Secondary | ICD-10-CM | POA: Diagnosis present

## 2015-12-12 DIAGNOSIS — Z86010 Personal history of colonic polyps: Secondary | ICD-10-CM | POA: Diagnosis not present

## 2015-12-12 DIAGNOSIS — F1410 Cocaine abuse, uncomplicated: Secondary | ICD-10-CM | POA: Diagnosis present

## 2015-12-12 DIAGNOSIS — Z833 Family history of diabetes mellitus: Secondary | ICD-10-CM

## 2015-12-12 DIAGNOSIS — F1424 Cocaine dependence with cocaine-induced mood disorder: Secondary | ICD-10-CM | POA: Diagnosis not present

## 2015-12-12 DIAGNOSIS — S02642A Fracture of ramus of left mandible, initial encounter for closed fracture: Secondary | ICD-10-CM | POA: Diagnosis not present

## 2015-12-12 DIAGNOSIS — I252 Old myocardial infarction: Secondary | ICD-10-CM | POA: Diagnosis not present

## 2015-12-12 DIAGNOSIS — E119 Type 2 diabetes mellitus without complications: Secondary | ICD-10-CM | POA: Diagnosis present

## 2015-12-12 DIAGNOSIS — Z7982 Long term (current) use of aspirin: Secondary | ICD-10-CM

## 2015-12-12 DIAGNOSIS — F329 Major depressive disorder, single episode, unspecified: Secondary | ICD-10-CM | POA: Insufficient documentation

## 2015-12-12 DIAGNOSIS — E7800 Pure hypercholesterolemia, unspecified: Secondary | ICD-10-CM | POA: Diagnosis present

## 2015-12-12 DIAGNOSIS — I219 Acute myocardial infarction, unspecified: Secondary | ICD-10-CM | POA: Diagnosis present

## 2015-12-12 DIAGNOSIS — S02609A Fracture of mandible, unspecified, initial encounter for closed fracture: Secondary | ICD-10-CM | POA: Diagnosis present

## 2015-12-12 DIAGNOSIS — Z79899 Other long term (current) drug therapy: Secondary | ICD-10-CM

## 2015-12-12 LAB — CBG MONITORING, ED: Glucose-Capillary: 98 mg/dL (ref 65–99)

## 2015-12-12 MED ORDER — METFORMIN HCL 500 MG PO TABS
500.0000 mg | ORAL_TABLET | Freq: Every day | ORAL | Status: DC
  Administered 2015-12-13 – 2015-12-18 (×4): 500 mg via ORAL
  Filled 2015-12-12 (×8): qty 1

## 2015-12-12 MED ORDER — TRAZODONE HCL 50 MG PO TABS
50.0000 mg | ORAL_TABLET | Freq: Every evening | ORAL | Status: DC | PRN
  Administered 2015-12-13 – 2015-12-17 (×4): 50 mg via ORAL
  Filled 2015-12-12 (×2): qty 1
  Filled 2015-12-12: qty 3
  Filled 2015-12-12: qty 1

## 2015-12-12 MED ORDER — LEVETIRACETAM 500 MG PO TABS
500.0000 mg | ORAL_TABLET | Freq: Two times a day (BID) | ORAL | Status: DC
  Administered 2015-12-13 – 2015-12-18 (×9): 500 mg via ORAL
  Filled 2015-12-12 (×3): qty 1
  Filled 2015-12-12: qty 6
  Filled 2015-12-12 (×5): qty 1
  Filled 2015-12-12: qty 6
  Filled 2015-12-12 (×7): qty 1

## 2015-12-12 MED ORDER — LISINOPRIL 2.5 MG PO TABS
2.5000 mg | ORAL_TABLET | Freq: Every day | ORAL | Status: DC
  Administered 2015-12-15 – 2015-12-18 (×3): 2.5 mg via ORAL
  Filled 2015-12-12 (×8): qty 1

## 2015-12-12 MED ORDER — PENICILLIN V POTASSIUM 500 MG PO TABS
500.0000 mg | ORAL_TABLET | Freq: Four times a day (QID) | ORAL | Status: DC
  Administered 2015-12-13 – 2015-12-18 (×21): 500 mg via ORAL
  Filled 2015-12-12 (×7): qty 1
  Filled 2015-12-12: qty 2
  Filled 2015-12-12 (×3): qty 1
  Filled 2015-12-12: qty 2
  Filled 2015-12-12 (×14): qty 1
  Filled 2015-12-12: qty 2
  Filled 2015-12-12 (×6): qty 1

## 2015-12-12 MED ORDER — MAGNESIUM HYDROXIDE 400 MG/5ML PO SUSP: 30.0000 mL | Freq: Every day | ORAL | Status: DC | PRN

## 2015-12-12 MED ORDER — ALUM & MAG HYDROXIDE-SIMETH 200-200-20 MG/5ML PO SUSP: 30.0000 mL | ORAL | Status: DC | PRN

## 2015-12-12 MED ORDER — RISPERIDONE 1 MG PO TBDP
1.0000 mg | ORAL_TABLET | Freq: Two times a day (BID) | ORAL | Status: DC
  Administered 2015-12-13 – 2015-12-18 (×8): 1 mg via ORAL
  Filled 2015-12-12 (×4): qty 1
  Filled 2015-12-12: qty 6
  Filled 2015-12-12 (×7): qty 1
  Filled 2015-12-12: qty 6
  Filled 2015-12-12 (×2): qty 1

## 2015-12-12 MED ORDER — HYDROCHLOROTHIAZIDE 25 MG PO TABS
25.0000 mg | ORAL_TABLET | Freq: Every day | ORAL | Status: DC
  Administered 2015-12-13 – 2015-12-18 (×4): 25 mg via ORAL
  Filled 2015-12-12 (×8): qty 1

## 2015-12-12 MED ORDER — ATORVASTATIN CALCIUM 10 MG PO TABS
10.0000 mg | ORAL_TABLET | Freq: Every morning | ORAL | Status: DC
  Administered 2015-12-13 – 2015-12-18 (×4): 10 mg via ORAL
  Filled 2015-12-12 (×8): qty 1

## 2015-12-12 MED ORDER — OXYCODONE-ACETAMINOPHEN 5-325 MG PO TABS
1.0000 | ORAL_TABLET | Freq: Once | ORAL | Status: AC
  Administered 2015-12-12: 1 via ORAL
  Filled 2015-12-12: qty 1

## 2015-12-12 MED ORDER — ACETAMINOPHEN 325 MG PO TABS
650.0000 mg | ORAL_TABLET | Freq: Four times a day (QID) | ORAL | Status: DC | PRN
  Administered 2015-12-13 – 2015-12-17 (×6): 650 mg via ORAL
  Filled 2015-12-12 (×6): qty 2

## 2015-12-12 MED ORDER — ASPIRIN EC 81 MG PO TBEC
81.0000 mg | DELAYED_RELEASE_TABLET | Freq: Every day | ORAL | Status: DC
  Administered 2015-12-13 – 2015-12-18 (×4): 81 mg via ORAL
  Filled 2015-12-12 (×8): qty 1

## 2015-12-12 MED ORDER — METOPROLOL TARTRATE 12.5 MG HALF TABLET
12.5000 mg | ORAL_TABLET | Freq: Two times a day (BID) | ORAL | Status: DC
  Administered 2015-12-13 – 2015-12-18 (×5): 12.5 mg via ORAL
  Filled 2015-12-12 (×15): qty 1
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
## 2015-12-12 NOTE — ED Notes (Signed)
Magistrate called. Inquired on status of IVC paper. Confirmed with Royce Macadamia that is being processed at this time.

## 2015-12-12 NOTE — ED Provider Notes (Signed)
Patient accepted to [facility]. Dr. Parke Poisson is accepting.   Sherwood Gambler, MD 12/12/15 2102

## 2015-12-12 NOTE — Progress Notes (Signed)
Accepted to [facility] bed 301-1, attending Dr. Parke Poisson. Can arrive 21:00 tonight per [facility] Report can be called at [phone].  Sharren Bridge, MSW, LCSW Clinical Social Work, Disposition  12/12/2015 [phone]

## 2015-12-12 NOTE — ED Notes (Signed)
Pt states "I won't have any pain soon"

## 2015-12-12 NOTE — ED Notes (Signed)
Dr. Constance Holster office contacted about update about patient surgery this morning. RN left message with Dr. Constance Holster assistant. Pt. Still NPO.
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
## 2015-12-13 DIAGNOSIS — Z8249 Family history of ischemic heart disease and other diseases of the circulatory system: Secondary | ICD-10-CM

## 2015-12-13 DIAGNOSIS — Z79899 Other long term (current) drug therapy: Secondary | ICD-10-CM

## 2015-12-13 DIAGNOSIS — Z888 Allergy status to other drugs, medicaments and biological substances status: Secondary | ICD-10-CM

## 2015-12-13 DIAGNOSIS — R45850 Homicidal ideations: Secondary | ICD-10-CM

## 2015-12-13 DIAGNOSIS — Z9889 Other specified postprocedural states: Secondary | ICD-10-CM

## 2015-12-13 DIAGNOSIS — F1424 Cocaine dependence with cocaine-induced mood disorder: Secondary | ICD-10-CM

## 2015-12-13 DIAGNOSIS — Z833 Family history of diabetes mellitus: Secondary | ICD-10-CM

## 2015-12-13 DIAGNOSIS — F332 Major depressive disorder, recurrent severe without psychotic features: Principal | ICD-10-CM

## 2015-12-13 LAB — GLUCOSE, CAPILLARY
GLUCOSE-CAPILLARY: 107 mg/dL — AB (ref 65–99)
Glucose-Capillary: 100 mg/dL — ABNORMAL HIGH (ref 65–99)
Glucose-Capillary: 102 mg/dL — ABNORMAL HIGH (ref 65–99)

## 2015-12-13 MED ORDER — SERTRALINE HCL 100 MG PO TABS
ORAL_TABLET | ORAL | Status: AC
  Administered 2015-12-13: 100 mg
  Filled 2015-12-13: qty 1

## 2015-12-13 MED ORDER — TRAMADOL HCL 50 MG PO TABS
50.0000 mg | ORAL_TABLET | Freq: Once | ORAL | Status: AC
  Administered 2015-12-13: 50 mg via ORAL
  Filled 2015-12-13: qty 1

## 2015-12-13 MED ORDER — GLUCERNA SHAKE PO LIQD
237.0000 mL | Freq: Three times a day (TID) | ORAL | Status: DC
  Administered 2015-12-14 (×2): 237 mL via ORAL

## 2015-12-13 MED ORDER — SERTRALINE HCL 100 MG PO TABS
100.0000 mg | ORAL_TABLET | Freq: Every day | ORAL | Status: DC
  Administered 2015-12-13 – 2015-12-18 (×4): 100 mg via ORAL
  Filled 2015-12-13 (×2): qty 1
  Filled 2015-12-13: qty 3
  Filled 2015-12-13 (×5): qty 1
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
## 2015-12-13 NOTE — [facility] Group Notes (Signed)
Goals Group  Date:  12/13/2015  Time:  0900        Goals Group  Type of Therapy:  Nurse Education  The group focuses on teaching patients how to identify attainable goals that will aid them in their recovery.  Participation Level:  Did Not Attend  Participation Quality:  N/A  Affect:  N/A  Cognitive:  N/A  Insight:  N/A  Engagement in Group:  N/A  Modes of Intervention:  N/A  Summary of Progress/Problems:  Lauralyn Primes 12/13/2015, 11:15 AM
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
## 2015-12-13 NOTE — Progress Notes (Signed)
Patient did not attend the evening speaker AA meeting. Pt was notified that group was beginning but remained in bed.   
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
## 2015-12-14 LAB — GLUCOSE, CAPILLARY
GLUCOSE-CAPILLARY: 123 mg/dL — AB (ref 65–99)
GLUCOSE-CAPILLARY: 130 mg/dL — AB (ref 65–99)

## 2015-12-14 MED ORDER — CELECOXIB 100 MG PO CAPS
ORAL_CAPSULE | ORAL | Status: AC
  Filled 2015-12-14: qty 1

## 2015-12-14 MED ORDER — HYDROCODONE-ACETAMINOPHEN 5-325 MG PO TABS
ORAL_TABLET | ORAL | Status: AC
  Filled 2015-12-14: qty 1

## 2015-12-14 MED ORDER — TRAMADOL HCL 50 MG PO TABS
50.0000 mg | ORAL_TABLET | Freq: Once | ORAL | Status: AC
  Administered 2015-12-14: 50 mg via ORAL
  Filled 2015-12-14: qty 1

## 2015-12-14 MED ORDER — PREGABALIN 100 MG PO CAPS
100.0000 mg | ORAL_CAPSULE | Freq: Three times a day (TID) | ORAL | Status: DC
  Administered 2015-12-14 – 2015-12-18 (×10): 100 mg via ORAL
  Filled 2015-12-14 (×10): qty 1

## 2015-12-14 MED ORDER — PREGABALIN 100 MG PO CAPS
ORAL_CAPSULE | ORAL | Status: AC
  Filled 2015-12-14: qty 1

## 2015-12-14 MED ORDER — HYDROCODONE-ACETAMINOPHEN 5-325 MG PO TABS
1.0000 | ORAL_TABLET | Freq: Four times a day (QID) | ORAL | Status: DC | PRN
  Administered 2015-12-14 – 2015-12-17 (×10): 1 via ORAL
  Filled 2015-12-14 (×10): qty 1

## 2015-12-14 MED ORDER — CELECOXIB 100 MG PO CAPS
100.0000 mg | ORAL_CAPSULE | Freq: Two times a day (BID) | ORAL | Status: DC
  Administered 2015-12-14 – 2015-12-18 (×6): 100 mg via ORAL
  Filled 2015-12-14 (×12): qty 1
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
## 2015-12-14 NOTE — [facility] Group Notes (Signed)
[place] Group Notes:  (Nursing/MHT/Case Management/Adjunct)  Date:  12/14/2015  Time:  10:26 AM  Type of Therapy:  Psychoeducational Skills  Participation Level:  Did Not Attend  Participation Quality:  Did Not Attend  Affect:  Did Not Attend  Cognitive:  Did Not Attend  Insight:  None  Engagement in Group:  Did Not Attend  Modes of Intervention:  Did Not Attend  Summary of Progress/Problems: Pt did not attend patient self inventory group.    Benancio Deeds Shanta 12/14/2015, 10:26 AM
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
## 2015-12-14 NOTE — Progress Notes (Signed)
Patient did not attend the evening speaker AA meeting. Pt was notified that group was beginning but returned to his room.   

## 2015-12-14 NOTE — [facility] Group Notes (Signed)
  [place] LCSW Group Therapy  12/14/2015  At 10:10 until 11:00 AM   Type of Therapy:  Group Therapy  Participation Level: Did not attend; invited to participate yet did not despite overhead announcement and encouragement by staff   Sheilah Pigeon, LCSW
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
## 2015-12-15 NOTE — Progress Notes (Signed)
The patient attended this evening's A.A.meeting and was appropriate. He initially refused to attend since he claimed that N.A. Never came to speak during his previous admission to the hospital.
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
## 2015-12-15 NOTE — Plan of Care (Signed)
Problem: Safety: Goal: Periods of time without injury will increase Outcome: Progressing Patient has not engaged in self harm.  Problem: Education: Goal: Verbalization of understanding the information provided will improve Outcome: Not Progressing Pt does not acknowledge either way whether he understands teaching.

## 2015-12-15 NOTE — Progress Notes (Signed)
[facility] referral faxed attn Juliann Pulse in admissions 12/15/2015 4:07 PM   Maxie Better, MSW, LCSW Clinical Social Worker 12/15/2015 4:07 PM

## 2015-12-15 NOTE — BHH Group Notes (Signed)
[place] LCSW Group Therapy  12/15/2015 2:22 PM  Type of Therapy:  Group Therapy  Participation Level:  Did Not Attend-pt invited. Chose to rest in room.   Summary of Progress/Problems: Today's Topic: Overcoming Obstacles. Patients identified one short term goal and potential obstacles in reaching this goal. Patients processed barriers involved in overcoming these obstacles. Patients identified steps necessary for overcoming these obstacles and explored motivation (internal and external) for facing these difficulties head on.   Oniya Mandarino N Smart LCSW 12/15/2015, 2:22 PM
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
## 2015-12-15 NOTE — BHH Suicide Risk Assessment (Signed)
[facility] INPATIENT:  Family/Significant Other Suicide Prevention Education  Suicide Prevention Education:  Patient Refusal for Family/Significant Other Suicide Prevention Education: The patient Dalton [name] has refused to provide written consent for family/significant other to be provided Family/Significant Other Suicide Prevention Education during admission and/or prior to discharge.  Physician notified.  SPE completed with pt, as pt refused to consent to family contact. SPI pamphlet provided to pt and pt was encouraged to share information with support network, ask questions, and talk about any concerns relating to SPE. Pt denies access to guns/firearms and verbalized understanding of information provided. Mobile Crisis information also provided to pt.   Kimber Relic Smart LCSW 12/15/2015, 12:39 PM
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
## 2015-12-15 NOTE — Progress Notes (Signed)
Recreation Therapy Notes  Date: 12/15/15 Time: 0930 Location: [address] Hall Dayroom  Group Topic: Stress Management  Goal Area(s) Addresses:  Patient will verbalize importance of using healthy stress management.  Patient will identify positive emotions associated with healthy stress management.   Intervention: Stress Management  Activity :  Floating on a Cloud.  LRT introduced the stress management technique of guided imagery.  LRT read a script which allowed patients to participate in the activity.  Patients were to follow along as LRT read script.  Education:  Stress Management, Discharge Planning.   Education Outcome: Acknowledges edcuation/In group clarification offered/Needs additional education  Clinical Observations/Feedback: Pt did not attend group.    Victorino Sparrow, LRT/CTRS         Victorino Sparrow A 12/15/2015 12:05 PM

## 2015-12-16 LAB — LIPID PANEL
CHOLESTEROL: 204 mg/dL — AB (ref 0–200)
HDL: 32 mg/dL — ABNORMAL LOW (ref >40)
LDL Cholesterol: 103 mg/dL — ABNORMAL HIGH (ref 0–99)
Total CHOL/HDL Ratio: 6.4 RATIO
Triglycerides: 343 mg/dL — ABNORMAL HIGH (ref <150)
VLDL: 69 mg/dL — AB (ref 0–40)

## 2015-12-16 LAB — GLUCOSE, CAPILLARY: GLUCOSE-CAPILLARY: 89 mg/dL (ref 65–99)

## 2015-12-16 LAB — TSH: TSH: 2.759 u[IU]/mL (ref 0.350–4.500)

## 2015-12-16 MED ORDER — ALBUTEROL SULFATE HFA 108 (90 BASE) MCG/ACT IN AERS
2.0000 | INHALATION_SPRAY | Freq: Four times a day (QID) | RESPIRATORY_TRACT | Status: DC | PRN
  Administered 2015-12-17 – 2015-12-18 (×3): 2 via RESPIRATORY_TRACT
  Filled 2015-12-16: qty 6.7

## 2015-12-16 NOTE — Progress Notes (Signed)
Recreation Therapy Notes    Animal-Assisted Activity (AAA) Program Checklist/Progress Notes Patient Eligibility Criteria Checklist & Daily Group note for Rec TxIntervention  Date: 10.24.2017 Time: 2:45pm Location: 4 00 Hall Dayroom   AAA/T Program Assumption of Risk Form signed by Patient/ or Parent Legal Guardian Yes  Patient is free of allergies or sever asthma Yes  Patient reports no fear of animals Yes  Patient reports no history of cruelty to animals Yes  Patient understands his/her participation is voluntary Yes  Behavioral Response: Did not attend. Patient declined participation in AAA sessions during admission.   Laureen [place], LRT/CTRS        Emmylou Bieker L 12/16/2015 3:01 PM

## 2015-12-16 NOTE — BHH Group Notes (Signed)
[place] LCSW Group Therapy  12/16/2015 1:22 PM  Type of Therapy:  Group Therapy  Participation Level:  Did Not Attend-pt invited. Chose to remain in bed.  Summary of Progress/Problems: [facility] Speaker came to talk about his personal journey with substance abuse and addiction. The pt processed ways by which to relate to the speaker. [place] speaker provided handouts and educational information pertaining to groups and services offered by the [facility].   Maude Gloor N Smart LCSW 12/16/2015, 1:22 PM
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
## 2015-12-16 NOTE — Progress Notes (Signed)
Patient ID: Dalton [name], male   DOB: Mar 30, 1960, 55 y.o.   MRN: [id number] D: Client in bed this shift, sleeping off and on. Client angry reports he missed dinner, and nothing was brought to him. A: Writer attempted to initiate conversation with client but he declined, also attempted to review and give 2000 medications, client refused "I don't want nothing, but pain medications" Writer told client pain medications weren't due at this time but available after 2200. Client also offered snacks. Staff will monitor q44min for safety. R:Client is safe on the unit, did not attend group.
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
## 2015-12-16 NOTE — Progress Notes (Signed)
Pt refused 1700 vital signs as well as all meds except Vicodin. He would not take anything except his "pain pill." Tried to discuss with him the need to control BP and take penicillin, as well as how other scheduled meds helped with pain, but pt was irritable and not receptive.
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
## 2015-12-16 NOTE — [facility] Group Notes (Signed)
Patient did not attend group.

## 2015-12-17 LAB — HEMOGLOBIN A1C
HEMOGLOBIN A1C: 5.5 % (ref 4.8–5.6)
MEAN PLASMA GLUCOSE: 111 mg/dL

## 2015-12-17 LAB — PROLACTIN: Prolactin: 85 ng/mL — ABNORMAL HIGH (ref 4.0–15.2)

## 2015-12-17 LAB — GLUCOSE, CAPILLARY: Glucose-Capillary: 95 mg/dL (ref 65–99)

## 2015-12-17 MED ORDER — GLUCERNA SHAKE PO LIQD
237.0000 mL | Freq: Three times a day (TID) | ORAL | Status: DC
  Administered 2015-12-17 – 2015-12-18 (×4): 237 mL via ORAL

## 2015-12-17 MED ORDER — HYDROCODONE-ACETAMINOPHEN 5-325 MG PO TABS
1.0000 | ORAL_TABLET | ORAL | Status: AC | PRN
  Administered 2015-12-17 (×2): 1 via ORAL
  Filled 2015-12-17 (×2): qty 1

## 2015-12-17 MED ORDER — HYDROCODONE-ACETAMINOPHEN 5-325 MG PO TABS
1.0000 | ORAL_TABLET | ORAL | Status: AC | PRN
  Administered 2015-12-17 – 2015-12-18 (×2): 1 via ORAL
  Filled 2015-12-17 (×2): qty 1

## 2015-12-17 MED ORDER — INFLUENZA VAC SPLIT QUAD 0.5 ML IM SUSY
0.5000 mL | PREFILLED_SYRINGE | INTRAMUSCULAR | Status: AC
  Administered 2015-12-18: 0.5 mL via INTRAMUSCULAR
  Filled 2015-12-17: qty 0.5

## 2015-12-17 MED ORDER — BENZOCAINE 10 % MT GEL
Freq: Four times a day (QID) | OROMUCOSAL | Status: DC | PRN
  Administered 2015-12-17 (×2): via OROMUCOSAL
  Filled 2015-12-17: qty 9.4
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
## 2015-12-17 NOTE — Progress Notes (Signed)
Recreation Therapy Notes  Date: 12/17/15 Time: 0930 Location: 300 Hall Dayroom  Group Topic: Stress Management  Goal Area(s) Addresses:  Patient will verbalize importance of using healthy stress management.  Patient will identify positive emotions associated with healthy stress management.   Behavioral Response: Engaged  Intervention: Stress Management  Activity :  Financial risk analyst.  LRT introduced the stress management technique of guided imagery.  LRT read script to engage patients in the technique.  Patients were to follow along as LRT read script.     Education:  Stress Management, Discharge Planning.   Education Outcome: Acknowledges edcuation/In group clarification offered/Needs additional education  Clinical Observations/Feedback:  Pt attended group.   Victorino Sparrow, LRT/CTRS         Victorino Sparrow A 12/17/2015 12:17 PM

## 2015-12-17 NOTE — BHH Group Notes (Signed)
[place] LCSW Group Therapy  12/17/2015 3:36 PM  Type of Therapy:  Group Therapy  Participation Level:  Active  Participation Quality:  Attentive  Affect:  Appropriate  Cognitive:  Appropriate  Insight:  Improving  Engagement in Therapy:  Improving  Modes of Intervention:  Confrontation, Discussion, Education, Exploration, Problem-solving, Rapport Building, Socialization and Support  Summary of Progress/Problems: Today's Topic: Overcoming Obstacles. Patients identified one short term goal and potential obstacles in reaching this goal. Patients processed barriers involved in overcoming these obstacles. Patients identified steps necessary for overcoming these obstacles and explored motivation (internal and external) for facing these difficulties head on. Dalton [name] was attentive and engaged during today's processing group. He shared that "other than my jaw hurting" his biggest obstacle involves being unable to return to [facility]. Dalton [name] stated that he is upset that he let that opportunity go and is hoping to talk to someone there to help them change their mind. Dalton [name] was given the number and encouraged to advocate for himself.   Dalton [name] N Smart LCSW 12/17/2015, 3:36 PM

## 2015-12-17 NOTE — Progress Notes (Signed)
CSW contacted [facility] pre[facility]mit dept and bed scheduling dept--noone was able to figure out when pt should be coming to [facility] for surgery. His surgery is scheduled on Friday 12/19/15 at 10:45AM with Dr. Izora Gala MD. CSW called Dr. Janeice Robinson office [phone] and left message requesting time for patient to come to the hospital for admission. He is having surgery on fractured jaw.  Maxie Better, MSW, LCSW Clinical Social Worker 12/17/2015 1:05 PM

## 2015-12-17 NOTE — Progress Notes (Signed)
Dalton [name] was in bed when this Probation officer approached. He would not get out of bed for meds and declined all attempts to take medications, even when this writer offered to bring them to his room. "I'm going to try to get her [MD] to let me go today anyway," he said. Pt was polite but adamant that he was not going to take medications. Will continue to monitor pt for needs/safety.

## 2015-12-18 ENCOUNTER — Other Ambulatory Visit: Payer: Self-pay | Admitting: Otolaryngology

## 2015-12-18 ENCOUNTER — Encounter (HOSPITAL_COMMUNITY): Payer: Self-pay | Admitting: Emergency Medicine

## 2015-12-18 ENCOUNTER — Emergency Department (HOSPITAL_COMMUNITY)
Admission: EM | Admit: 2015-12-18 | Discharge: 2015-12-19 | Disposition: A | Discharge status: Home / Self Care | Payer: Medicare Other | Source: Home / Self Care

## 2015-12-18 ENCOUNTER — Ambulatory Visit: Payer: Self-pay | Admitting: Otolaryngology

## 2015-12-18 DIAGNOSIS — I509 Heart failure, unspecified: Secondary | ICD-10-CM | POA: Insufficient documentation

## 2015-12-18 DIAGNOSIS — E1151 Type 2 diabetes mellitus with diabetic peripheral angiopathy without gangrene: Secondary | ICD-10-CM | POA: Insufficient documentation

## 2015-12-18 DIAGNOSIS — I2510 Atherosclerotic heart disease of native coronary artery without angina pectoris: Secondary | ICD-10-CM | POA: Insufficient documentation

## 2015-12-18 DIAGNOSIS — Z79899 Other long term (current) drug therapy: Secondary | ICD-10-CM

## 2015-12-18 DIAGNOSIS — Z0000 Encounter for general adult medical examination without abnormal findings: Secondary | ICD-10-CM

## 2015-12-18 DIAGNOSIS — I252 Old myocardial infarction: Secondary | ICD-10-CM | POA: Insufficient documentation

## 2015-12-18 DIAGNOSIS — S02609A Fracture of mandible, unspecified, initial encounter for closed fracture: Secondary | ICD-10-CM | POA: Diagnosis not present

## 2015-12-18 DIAGNOSIS — Z139 Encounter for screening, unspecified: Secondary | ICD-10-CM

## 2015-12-18 DIAGNOSIS — Z7984 Long term (current) use of oral hypoglycemic drugs: Secondary | ICD-10-CM | POA: Insufficient documentation

## 2015-12-18 DIAGNOSIS — I110 Hypertensive heart disease with heart failure: Secondary | ICD-10-CM

## 2015-12-18 DIAGNOSIS — Z951 Presence of aortocoronary bypass graft: Secondary | ICD-10-CM

## 2015-12-18 DIAGNOSIS — R6884 Jaw pain: Secondary | ICD-10-CM | POA: Insufficient documentation

## 2015-12-18 LAB — GLUCOSE, CAPILLARY: Glucose-Capillary: 111 mg/dL — ABNORMAL HIGH (ref 65–99)

## 2015-12-18 MED ORDER — PREGABALIN 100 MG PO CAPS: 100.0000 mg | ORAL_CAPSULE | Freq: Three times a day (TID) | ORAL | 0 refills | Status: DC

## 2015-12-18 MED ORDER — SERTRALINE HCL 100 MG PO TABS: 100.0000 mg | ORAL_TABLET | Freq: Every day | ORAL | 0 refills | Status: DC

## 2015-12-18 MED ORDER — LISINOPRIL 2.5 MG PO TABS: 2.5000 mg | ORAL_TABLET | Freq: Every day | ORAL | 0 refills | Status: DC

## 2015-12-18 MED ORDER — RISPERIDONE 1 MG PO TBDP: 1.0000 mg | ORAL_TABLET | Freq: Two times a day (BID) | ORAL | 0 refills | Status: DC

## 2015-12-18 MED ORDER — METFORMIN HCL 500 MG PO TABS: 500.0000 mg | ORAL_TABLET | Freq: Every day | ORAL | 0 refills | Status: DC

## 2015-12-18 MED ORDER — TRAZODONE HCL 50 MG PO TABS: 50.0000 mg | ORAL_TABLET | Freq: Every evening | ORAL | 0 refills | Status: DC | PRN

## 2015-12-18 MED ORDER — HYDROCHLOROTHIAZIDE 25 MG PO TABS: 25.0000 mg | ORAL_TABLET | Freq: Every day | ORAL | 0 refills | Status: DC

## 2015-12-18 MED ORDER — HYDROCODONE-ACETAMINOPHEN 5-325 MG PO TABS: 1.0000 | ORAL_TABLET | ORAL | Status: DC | PRN

## 2015-12-18 MED ORDER — LEVETIRACETAM 500 MG PO TABS: 500.0000 mg | ORAL_TABLET | Freq: Two times a day (BID) | ORAL | 0 refills | Status: DC

## 2015-12-18 MED ORDER — ATORVASTATIN CALCIUM 10 MG PO TABS: 10.0000 mg | ORAL_TABLET | Freq: Every morning | ORAL | 0 refills | Status: DC

## 2015-12-18 MED ORDER — METOPROLOL TARTRATE 25 MG PO TABS: 12.5000 mg | ORAL_TABLET | Freq: Two times a day (BID) | ORAL | 0 refills | Status: DC
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
## 2015-12-18 NOTE — ED Notes (Signed)
Spoke with Camera operator about pt situation and surgery in AM, NP at bedside. Pt will be relocated to [facility]
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
## 2015-12-18 NOTE — Progress Notes (Signed)
Discharge note:  Patient discharged home per MD order.  Patient received all personal belongings from locker and unit.  Patient denies any thoughts of self harm; AVH/HI.  Reviewed AVS/transition record with patient and he indicated understanding.  Patient received samples of medications and prescriptions.  Patient left ambulatory with a bus pass.

## 2015-12-18 NOTE — Progress Notes (Signed)
  [facility] Adult Case Management Discharge Plan :  Will you be returning to the same living situation after discharge:  Yes,  plans to stay at hotel until his surgery tomorrow. At discharge, do you have transportation home?: Yes,  bus pass in chart. Do you have the ability to pay for your medications: Yes,  [facility] Medicare and Medicaid  Release of information consent forms completed and submitted to medical records by CSW. Patient to Follow up at: [facility] Follow up on 12/24/2015.   Specialty:  Behavioral Health Why:  Appt on this date at 11:00AM with Dr. Reece Levy for hospital follow-up/medication management. Thank you.  Contact information: [address] [place] [address] [phone]        [facility] Follow up on 12/24/2015.   Why:  Appt on this date at 4:00PM for counselnig with [facility]. Thank you.  Contact information: [place], [place] [address] PhONe: [phone] Fax: [phone]       [facility] Follow up on 12/19/2015.   Why:  surgery scheduled with Dr. Izora Gala MD for Open Reduction Internal Fixation/Mandibular Fracture. 10:45AM surgery time scheduled. Message left for [facility]-preadmission dept requesting information about time/date that you must be admitted.  Contact information: [place] SSN-[id number] [phone]             Next level of care provider has access to [place] and Suicide Prevention discussed: Yes,  SPE completed with pt; pt declined to consent to family contact. SPI pamphlet and Mobile Crisis information provided.  Have you used any form of tobacco in the last 30 days? (Cigarettes, Smokeless Tobacco, Cigars, and/or Pipes): No  Has patient been referred to the Quitline?: N/A patient is not a smoker  Patient has been referred for addiction treatment: Yes  Kimber Relic Smart  LCSW 12/18/2015, 10:51 AM

## 2015-12-18 NOTE — Progress Notes (Signed)
Pt has been observed in the dayroom most of the evening talking with peers.  He denies any withdrawal symptoms at this time and is looking forward to go to the hospital on Friday for surgery to his jaw.  He says that he will be taken from [facility] to the hospital that morning.  He says that he is not sure where he will be going after the surgery, but mentions that he may be coming back to [facility]  He contracts for safety on the unit.  He denies HI/AVH.  He is quite social with his male peers.  He makes his needs known to staff and has received several prn medications this evening.  Support and encouragement offered.  Discharge plans are in process.  Meds given as ordered.  Safety maintained with q15 minute checks.
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
## 2015-12-18 NOTE — [facility] Group Notes (Signed)
[facility] Group Notes:  (Nursing/MHT/Case Management/Adjunct)  Date:  12/18/2015  Time:  1:40 PM  Type of Therapy:  Nurse Education  Participation Level:  Active  Participation Quality:  Appropriate  Affect:  Appropriate  Cognitive:  Alert and Appropriate  Insight:  Appropriate  Engagement in Group:  Engaged  Modes of Intervention:  Discussion and Problem-solving  Summary of Progress/Problems: Patient attended nurse education group. He was able to report his goal for today as, "Feel better. Get my surgery for my jaw". He was appropriate.   Cheri Kearns 12/18/2015, 1:40 PM

## 2015-12-19 ENCOUNTER — Observation Stay (HOSPITAL_COMMUNITY)
Admission: AD | Admit: 2015-12-19 | Discharge: 2015-12-22 | Disposition: A | Discharge status: Home / Self Care | Payer: Medicare Other | Source: Ambulatory Visit | Attending: Otolaryngology | Admitting: Otolaryngology

## 2015-12-19 ENCOUNTER — Encounter (HOSPITAL_COMMUNITY)
Admission: AD | Disposition: A | Discharge status: Home / Self Care | Payer: Self-pay | Source: Ambulatory Visit | Attending: Otolaryngology

## 2015-12-19 ENCOUNTER — Ambulatory Visit (HOSPITAL_COMMUNITY): Payer: Medicare Other | Admitting: Anesthesiology

## 2015-12-19 ENCOUNTER — Encounter (HOSPITAL_COMMUNITY): Payer: Self-pay | Admitting: *Deleted

## 2015-12-19 DIAGNOSIS — I252 Old myocardial infarction: Secondary | ICD-10-CM | POA: Insufficient documentation

## 2015-12-19 DIAGNOSIS — I509 Heart failure, unspecified: Secondary | ICD-10-CM | POA: Diagnosis not present

## 2015-12-19 DIAGNOSIS — Z8711 Personal history of peptic ulcer disease: Secondary | ICD-10-CM | POA: Insufficient documentation

## 2015-12-19 DIAGNOSIS — I110 Hypertensive heart disease with heart failure: Secondary | ICD-10-CM | POA: Insufficient documentation

## 2015-12-19 DIAGNOSIS — Z590 Homelessness: Secondary | ICD-10-CM | POA: Insufficient documentation

## 2015-12-19 DIAGNOSIS — X58XXXA Exposure to other specified factors, initial encounter: Secondary | ICD-10-CM | POA: Insufficient documentation

## 2015-12-19 DIAGNOSIS — E119 Type 2 diabetes mellitus without complications: Secondary | ICD-10-CM | POA: Insufficient documentation

## 2015-12-19 DIAGNOSIS — S02609A Fracture of mandible, unspecified, initial encounter for closed fracture: Principal | ICD-10-CM | POA: Insufficient documentation

## 2015-12-19 DIAGNOSIS — K219 Gastro-esophageal reflux disease without esophagitis: Secondary | ICD-10-CM | POA: Insufficient documentation

## 2015-12-19 DIAGNOSIS — I2510 Atherosclerotic heart disease of native coronary artery without angina pectoris: Secondary | ICD-10-CM | POA: Insufficient documentation

## 2015-12-19 HISTORY — DX: Anxiety disorder, unspecified: F41.9

## 2015-12-19 HISTORY — PX: ORIF MANDIBULAR FRACTURE: SHX2127

## 2015-12-19 LAB — POCT I-STAT 4, (NA,K, GLUC, HGB,HCT)
Glucose, Bld: 83 mg/dL (ref 65–99)
HCT: 41 % (ref 39.0–52.0)
Hemoglobin: 13.9 g/dL (ref 13.0–17.0)
POTASSIUM: 5.1 mmol/L (ref 3.5–5.1)
SODIUM: 137 mmol/L (ref 135–145)

## 2015-12-19 LAB — GLUCOSE, CAPILLARY
GLUCOSE-CAPILLARY: 101 mg/dL — AB (ref 65–99)
Glucose-Capillary: 126 mg/dL — ABNORMAL HIGH (ref 65–99)

## 2015-12-19 SURGERY — OPEN REDUCTION INTERNAL FIXATION (ORIF) MANDIBULAR FRACTURE
Anesthesia: General | Site: Mouth

## 2015-12-19 MED ORDER — HYDROMORPHONE HCL 2 MG/ML IJ SOLN
INTRAMUSCULAR | Status: AC
  Filled 2015-12-19: qty 1

## 2015-12-19 MED ORDER — RISPERIDONE 1 MG PO TBDP
1.0000 mg | ORAL_TABLET | Freq: Two times a day (BID) | ORAL | Status: DC
  Administered 2015-12-19 – 2015-12-22 (×6): 1 mg via ORAL
  Filled 2015-12-19 (×8): qty 1

## 2015-12-19 MED ORDER — POTASSIUM CHLORIDE 2 MEQ/ML IV SOLN
INTRAVENOUS | Status: DC
  Filled 2015-12-19: qty 1000

## 2015-12-19 MED ORDER — FLUTICASONE PROPIONATE 50 MCG/ACT NA SUSP: 2.0000 | Freq: Every day | NASAL | Status: DC | PRN

## 2015-12-19 MED ORDER — ALBUTEROL SULFATE HFA 108 (90 BASE) MCG/ACT IN AERS: 2.0000 | INHALATION_SPRAY | Freq: Four times a day (QID) | RESPIRATORY_TRACT | Status: DC | PRN

## 2015-12-19 MED ORDER — HYDROCODONE-ACETAMINOPHEN 7.5-325 MG PO TABS: 1.0000 | ORAL_TABLET | Freq: Four times a day (QID) | ORAL | 0 refills | Status: DC | PRN

## 2015-12-19 MED ORDER — HYDROCODONE-ACETAMINOPHEN 5-325 MG PO TABS
1.0000 | ORAL_TABLET | ORAL | Status: DC | PRN
  Administered 2015-12-19 – 2015-12-22 (×7): 2 via ORAL
  Filled 2015-12-19 (×7): qty 2

## 2015-12-19 MED ORDER — PREGABALIN 50 MG PO CAPS
100.0000 mg | ORAL_CAPSULE | Freq: Three times a day (TID) | ORAL | Status: DC
  Administered 2015-12-19 – 2015-12-22 (×8): 100 mg via ORAL
  Filled 2015-12-19 (×8): qty 2

## 2015-12-19 MED ORDER — SERTRALINE HCL 100 MG PO TABS
100.0000 mg | ORAL_TABLET | Freq: Every day | ORAL | Status: DC
  Administered 2015-12-19 – 2015-12-22 (×4): 100 mg via ORAL
  Filled 2015-12-19 (×4): qty 1

## 2015-12-19 MED ORDER — CLINDAMYCIN PALMITATE HCL 75 MG/5ML PO SOLR
300.0000 mg | Freq: Three times a day (TID) | ORAL | Status: DC
  Administered 2015-12-19 – 2015-12-22 (×8): 300 mg via ORAL
  Filled 2015-12-19 (×11): qty 20

## 2015-12-19 MED ORDER — PROPOFOL 10 MG/ML IV BOLUS
INTRAVENOUS | Status: AC
  Filled 2015-12-19: qty 20

## 2015-12-19 MED ORDER — CLINDAMYCIN PHOSPHATE 900 MG/50ML IV SOLN: 900.0000 mg | INTRAVENOUS | Status: DC

## 2015-12-19 MED ORDER — FENTANYL CITRATE (PF) 100 MCG/2ML IJ SOLN
INTRAMUSCULAR | Status: DC | PRN
  Administered 2015-12-19 (×3): 50 ug via INTRAVENOUS

## 2015-12-19 MED ORDER — POTASSIUM CHLORIDE CRYS ER 20 MEQ PO TBCR
20.0000 meq | EXTENDED_RELEASE_TABLET | Freq: Every day | ORAL | Status: DC
  Administered 2015-12-20 – 2015-12-22 (×3): 20 meq via ORAL
  Filled 2015-12-19 (×3): qty 1

## 2015-12-19 MED ORDER — LORATADINE 10 MG PO TABS
10.0000 mg | ORAL_TABLET | Freq: Every day | ORAL | Status: DC
  Administered 2015-12-19 – 2015-12-22 (×4): 10 mg via ORAL
  Filled 2015-12-19 (×4): qty 1

## 2015-12-19 MED ORDER — LIDOCAINE-EPINEPHRINE 1 %-1:100000 IJ SOLN
INTRAMUSCULAR | Status: AC
  Filled 2015-12-19: qty 1

## 2015-12-19 MED ORDER — CLINDAMYCIN HCL 300 MG PO CAPS: 300.0000 mg | ORAL_CAPSULE | Freq: Three times a day (TID) | ORAL | 0 refills | Status: DC

## 2015-12-19 MED ORDER — METFORMIN HCL 500 MG PO TABS
500.0000 mg | ORAL_TABLET | Freq: Every day | ORAL | Status: DC
  Administered 2015-12-20 – 2015-12-22 (×3): 500 mg via ORAL
  Filled 2015-12-19 (×3): qty 1

## 2015-12-19 MED ORDER — PROMETHAZINE HCL 25 MG RE SUPP: 25.0000 mg | Freq: Four times a day (QID) | RECTAL | 1 refills | Status: DC | PRN

## 2015-12-19 MED ORDER — POTASSIUM CHLORIDE CRYS ER 20 MEQ PO TBCR: 20.0000 meq | EXTENDED_RELEASE_TABLET | Freq: Every day | ORAL | Status: DC

## 2015-12-19 MED ORDER — OXYMETAZOLINE HCL 0.05 % NA SOLN
NASAL | Status: AC
  Filled 2015-12-19: qty 15

## 2015-12-19 MED ORDER — HYDROXYZINE HCL 25 MG PO TABS
25.0000 mg | ORAL_TABLET | Freq: Four times a day (QID) | ORAL | Status: DC
  Administered 2015-12-19 – 2015-12-22 (×12): 25 mg via ORAL
  Filled 2015-12-19 (×12): qty 1

## 2015-12-19 MED ORDER — ALPRAZOLAM 0.5 MG PO TABS
2.0000 mg | ORAL_TABLET | Freq: Two times a day (BID) | ORAL | Status: DC | PRN
  Administered 2015-12-19: 2 mg via ORAL
  Filled 2015-12-19: qty 4

## 2015-12-19 MED ORDER — OXYCODONE HCL 5 MG/5ML PO SOLN
5.0000 mg | ORAL | Status: DC | PRN
  Administered 2015-12-19 – 2015-12-22 (×6): 10 mg via ORAL
  Filled 2015-12-19 (×6): qty 10

## 2015-12-19 MED ORDER — SUGAMMADEX SODIUM 200 MG/2ML IV SOLN
INTRAVENOUS | Status: DC | PRN
  Administered 2015-12-19: 200 mg via INTRAVENOUS

## 2015-12-19 MED ORDER — BACITRACIN ZINC 500 UNIT/GM EX OINT
TOPICAL_OINTMENT | CUTANEOUS | Status: AC
  Filled 2015-12-19: qty 28.35

## 2015-12-19 MED ORDER — MIDAZOLAM HCL 5 MG/5ML IJ SOLN
INTRAMUSCULAR | Status: DC | PRN
  Administered 2015-12-19 (×2): 1 mg via INTRAVENOUS

## 2015-12-19 MED ORDER — PROMETHAZINE HCL 25 MG RE SUPP: 25.0000 mg | Freq: Four times a day (QID) | RECTAL | Status: DC | PRN

## 2015-12-19 MED ORDER — METOPROLOL TARTRATE 12.5 MG HALF TABLET
12.5000 mg | ORAL_TABLET | Freq: Once | ORAL | Status: AC
Start: 2015-12-19 — End: 2015-12-19
  Administered 2015-12-19: 12.5 mg via ORAL
  Filled 2015-12-19: qty 1

## 2015-12-19 MED ORDER — FUROSEMIDE 40 MG PO TABS: 40.0000 mg | ORAL_TABLET | Freq: Every day | ORAL | Status: DC | PRN

## 2015-12-19 MED ORDER — HYDROCHLOROTHIAZIDE 25 MG PO TABS
25.0000 mg | ORAL_TABLET | Freq: Every day | ORAL | Status: DC
  Administered 2015-12-20 – 2015-12-22 (×3): 25 mg via ORAL
  Filled 2015-12-19 (×3): qty 1

## 2015-12-19 MED ORDER — BUDESONIDE 0.25 MG/2ML IN SUSP
0.2500 mg | Freq: Two times a day (BID) | RESPIRATORY_TRACT | Status: DC
  Administered 2015-12-19 – 2015-12-22 (×5): 0.25 mg via RESPIRATORY_TRACT
  Filled 2015-12-19 (×6): qty 2

## 2015-12-19 MED ORDER — HYDROMORPHONE HCL 1 MG/ML IJ SOLN
0.2500 mg | INTRAMUSCULAR | Status: DC | PRN
  Administered 2015-12-19 (×4): 0.5 mg via INTRAVENOUS

## 2015-12-19 MED ORDER — LEVETIRACETAM 500 MG PO TABS
500.0000 mg | ORAL_TABLET | Freq: Two times a day (BID) | ORAL | Status: DC
  Administered 2015-12-19 – 2015-12-22 (×6): 500 mg via ORAL
  Filled 2015-12-19 (×6): qty 1

## 2015-12-19 MED ORDER — ONDANSETRON HCL 4 MG/2ML IJ SOLN
INTRAMUSCULAR | Status: DC | PRN
  Administered 2015-12-19: 4 mg via INTRAVENOUS

## 2015-12-19 MED ORDER — TAMSULOSIN HCL 0.4 MG PO CAPS
0.4000 mg | ORAL_CAPSULE | Freq: Every day | ORAL | Status: DC
  Administered 2015-12-20 – 2015-12-22 (×3): 0.4 mg via ORAL
  Filled 2015-12-19 (×3): qty 1

## 2015-12-19 MED ORDER — ROCURONIUM BROMIDE 100 MG/10ML IV SOLN
INTRAVENOUS | Status: DC | PRN
  Administered 2015-12-19: 50 mg via INTRAVENOUS

## 2015-12-19 MED ORDER — METOPROLOL TARTRATE 12.5 MG HALF TABLET
ORAL_TABLET | ORAL | Status: AC
  Administered 2015-12-19: 12.5 mg via ORAL
  Filled 2015-12-19: qty 1

## 2015-12-19 MED ORDER — LACTATED RINGERS IV SOLN
INTRAVENOUS | Status: DC
  Administered 2015-12-19: 17:00:00 via INTRAVENOUS
  Filled 2015-12-19 (×2): qty 1000

## 2015-12-19 MED ORDER — LISINOPRIL 5 MG PO TABS
2.5000 mg | ORAL_TABLET | Freq: Every day | ORAL | Status: DC
  Administered 2015-12-19 – 2015-12-22 (×4): 2.5 mg via ORAL
  Filled 2015-12-19 (×4): qty 1

## 2015-12-19 MED ORDER — PROPOFOL 10 MG/ML IV BOLUS
INTRAVENOUS | Status: DC | PRN
  Administered 2015-12-19: 160 mg via INTRAVENOUS
  Administered 2015-12-19: 20 mg via INTRAVENOUS
  Administered 2015-12-19: 30 mg via INTRAVENOUS

## 2015-12-19 MED ORDER — 0.9 % SODIUM CHLORIDE (POUR BTL) OPTIME
TOPICAL | Status: DC | PRN
  Administered 2015-12-19: 1000 mL

## 2015-12-19 MED ORDER — CLINDAMYCIN PHOSPHATE 900 MG/50ML IV SOLN
900.0000 mg | INTRAVENOUS | Status: AC
  Administered 2015-12-19: 900 mg via INTRAVENOUS
  Filled 2015-12-19: qty 50

## 2015-12-19 MED ORDER — LIDOCAINE HCL (CARDIAC) 20 MG/ML IV SOLN
INTRAVENOUS | Status: DC | PRN
  Administered 2015-12-19: 90 mg via INTRAVENOUS

## 2015-12-19 MED ORDER — METOPROLOL TARTRATE 12.5 MG HALF TABLET
12.5000 mg | ORAL_TABLET | Freq: Two times a day (BID) | ORAL | Status: DC
  Administered 2015-12-19 – 2015-12-22 (×6): 12.5 mg via ORAL
  Filled 2015-12-19 (×6): qty 1

## 2015-12-19 MED ORDER — LACTATED RINGERS IV SOLN
INTRAVENOUS | Status: DC | PRN
  Administered 2015-12-19 (×2): via INTRAVENOUS

## 2015-12-19 MED ORDER — ASPIRIN EC 81 MG PO TBEC
81.0000 mg | DELAYED_RELEASE_TABLET | Freq: Every day | ORAL | Status: DC
  Administered 2015-12-19 – 2015-12-22 (×4): 81 mg via ORAL
  Filled 2015-12-19 (×4): qty 1

## 2015-12-19 MED ORDER — SUCCINYLCHOLINE CHLORIDE 20 MG/ML IJ SOLN
INTRAMUSCULAR | Status: DC | PRN
  Administered 2015-12-19: 100 mg via INTRAVENOUS

## 2015-12-19 MED ORDER — PROMETHAZINE HCL 25 MG PO TABS: 25.0000 mg | ORAL_TABLET | Freq: Four times a day (QID) | ORAL | Status: DC | PRN

## 2015-12-19 MED ORDER — ALBUTEROL SULFATE (2.5 MG/3ML) 0.083% IN NEBU
2.5000 mg | INHALATION_SOLUTION | Freq: Four times a day (QID) | RESPIRATORY_TRACT | Status: DC | PRN
  Administered 2015-12-19 – 2015-12-21 (×2): 2.5 mg via RESPIRATORY_TRACT
  Filled 2015-12-19 (×2): qty 3

## 2015-12-19 MED ORDER — TRAZODONE HCL 50 MG PO TABS: 50.0000 mg | ORAL_TABLET | Freq: Every evening | ORAL | Status: DC | PRN

## 2015-12-19 MED ORDER — MIDAZOLAM HCL 2 MG/2ML IJ SOLN
INTRAMUSCULAR | Status: AC
  Filled 2015-12-19: qty 2

## 2015-12-19 MED ORDER — PANTOPRAZOLE SODIUM 40 MG PO TBEC
40.0000 mg | DELAYED_RELEASE_TABLET | Freq: Every day | ORAL | Status: DC
  Administered 2015-12-19 – 2015-12-22 (×4): 40 mg via ORAL
  Filled 2015-12-19 (×4): qty 1

## 2015-12-19 MED ORDER — PROMETHAZINE HCL 25 MG/ML IJ SOLN: 6.2500 mg | INTRAMUSCULAR | Status: DC | PRN

## 2015-12-19 MED ORDER — FENTANYL CITRATE (PF) 100 MCG/2ML IJ SOLN
INTRAMUSCULAR | Status: AC
  Filled 2015-12-19: qty 2

## 2015-12-19 MED ORDER — LAMOTRIGINE 25 MG PO TABS
200.0000 mg | ORAL_TABLET | Freq: Every day | ORAL | Status: DC
  Administered 2015-12-19 – 2015-12-22 (×4): 200 mg via ORAL
  Filled 2015-12-19 (×4): qty 8

## 2015-12-19 MED ORDER — ATORVASTATIN CALCIUM 10 MG PO TABS
10.0000 mg | ORAL_TABLET | Freq: Every day | ORAL | Status: DC
  Administered 2015-12-19 – 2015-12-21 (×2): 10 mg via ORAL
  Filled 2015-12-19 (×2): qty 1

## 2015-12-19 MED ORDER — IPRATROPIUM BROMIDE 0.02 % IN SOLN: 0.5000 mg | Freq: Four times a day (QID) | RESPIRATORY_TRACT | Status: DC | PRN

## 2015-12-19 SURGICAL SUPPLY — 51 items
BIT DRILL TWIST 1.3X5 (BIT) ×1
BIT DRILL TWIST 1.3X5MM (BIT) IMPLANT
BIT DRILL TWIST 1.6X58MM (BIT) IMPLANT
BLADE SURG 15 STRL LF DISP TIS (BLADE) IMPLANT
BLADE SURG 15 STRL SS (BLADE)
CANISTER SUCTION 2500CC (MISCELLANEOUS) ×3 IMPLANT
CLEANER TIP ELECTROSURG 2X2 (MISCELLANEOUS) ×2 IMPLANT
COVER SURGICAL LIGHT HANDLE (MISCELLANEOUS) ×3 IMPLANT
DRAPE PROXIMA HALF (DRAPES) IMPLANT
DRILL BIT TWIST 1.3X5MM (BIT) ×3
DRILL TWIST 1.6X58MM (BIT) ×3
ELECT COATED BLADE 2.86 ST (ELECTRODE) ×2 IMPLANT
ELECT NDL TIP 2.8 STRL (NEEDLE) IMPLANT
ELECT NEEDLE TIP 2.8 STRL (NEEDLE) ×3 IMPLANT
ELECT REM PT RETURN 9FT ADLT (ELECTROSURGICAL) ×3
ELECTRODE REM PT RTRN 9FT ADLT (ELECTROSURGICAL) IMPLANT
GLOVE BIO SURGEON STRL SZ8 (GLOVE) ×2 IMPLANT
GLOVE BIOGEL PI IND STRL 8 (GLOVE) IMPLANT
GLOVE BIOGEL PI IND STRL 8.5 (GLOVE) IMPLANT
GLOVE BIOGEL PI INDICATOR 8 (GLOVE) ×2
GLOVE BIOGEL PI INDICATOR 8.5 (GLOVE) ×2
GLOVE ECLIPSE 7.5 STRL STRAW (GLOVE) ×3 IMPLANT
GLOVE INDICATOR 7.0 STRL GRN (GLOVE) ×2 IMPLANT
GLOVE SURG SS PI 7.0 STRL IVOR (GLOVE) ×2 IMPLANT
GOWN STRL REUS W/ TWL LRG LVL3 (GOWN DISPOSABLE) ×2 IMPLANT
GOWN STRL REUS W/ TWL XL LVL3 (GOWN DISPOSABLE) IMPLANT
GOWN STRL REUS W/TWL LRG LVL3 (GOWN DISPOSABLE) ×6
GOWN STRL REUS W/TWL XL LVL3 (GOWN DISPOSABLE) ×3
KIT BASIN OR (CUSTOM PROCEDURE TRAY) ×3 IMPLANT
KIT ROOM TURNOVER OR (KITS) ×3 IMPLANT
NDL PRECISIONGLIDE 27X1.5 (NEEDLE) ×1 IMPLANT
NEEDLE PRECISIONGLIDE 27X1.5 (NEEDLE) ×3 IMPLANT
NS IRRIG 1000ML POUR BTL (IV SOLUTION) ×3 IMPLANT
PAD ARMBOARD 7.5X6 YLW CONV (MISCELLANEOUS) ×6 IMPLANT
PENCIL FOOT CONTROL (ELECTRODE) ×2 IMPLANT
PLATE 4 H MINI W/BAR (Plate) ×2 IMPLANT
PLATE MNDBLE MINI 4H (Plate) ×2 IMPLANT
SCISSORS WIRE DISP (INSTRUMENTS) ×1 IMPLANT
SCREW LOCKING 2.3X10MM (Screw) ×2 IMPLANT
SCREW LOCKING 2.3X6MM (Screw) ×4 IMPLANT
SCREW MIDFACE 1.7X4MM SLF TAP (Screw) ×8 IMPLANT
SUT CHROMIC 3 0 PS 2 (SUTURE) ×1 IMPLANT
SUT STEEL 0 (SUTURE)
SUT STEEL 0 18XMFL TIE 17 (SUTURE) IMPLANT
SUT STEEL 2 (SUTURE) IMPLANT
SUT STEEL 4 (SUTURE) IMPLANT
SUT VIC AB 3-0 FS2 27 (SUTURE) ×2 IMPLANT
SUT VICRYL 4-0 PS2 18IN ABS (SUTURE) ×1 IMPLANT
TOWEL OR 17X24 6PK STRL BLUE (TOWEL DISPOSABLE) ×3 IMPLANT
TRAY ENT MC OR (CUSTOM PROCEDURE TRAY) ×3 IMPLANT
WATER STERILE IRR 1000ML POUR (IV SOLUTION) ×3 IMPLANT

## 2015-12-19 NOTE — Progress Notes (Signed)
Pt. Stating he has no where to go when discharged from hospital. States he is homeless and yesterday discharged from [facility]. He will not have a place to go until Monday ([facility]). Notified Caryl Pina, Education officer, museum in ED. Stated she would come up and talk to pt.  OR ready for pt. To go back for surgery. Caryl Pina did not come up. Left note on chart to call her when pt. Gets out of surgery.

## 2015-12-19 NOTE — Interval H&P Note (Signed)
History and Physical Interval Note:  12/19/2015 12:26 PM  Dalton [name]  has presented today for surgery, with the diagnosis of closed mandible fx  The various methods of treatment have been discussed with the patient and family. After consideration of risks, benefits and other options for treatment, the patient has consented to  Procedure(s): OPEN REDUCTION INTERNAL FIXATION (ORIF) MANDIBULAR FRACTURE (N/A) as a surgical intervention .  The patient's history has been reviewed, patient examined, no change in status, stable for surgery.  I have reviewed the patient's chart and labs.  Questions were answered to the patient's satisfaction.     [name]

## 2015-12-19 NOTE — Progress Notes (Signed)
   12/19/15 1004  OBSTRUCTIVE SLEEP APNEA  Have you ever been diagnosed with sleep apnea through a sleep study? No (doesn't know the results)  Do you snore loudly (loud enough to be heard through closed doors)?  0  Do you often feel tired, fatigued, or sleepy during the daytime (such as falling asleep during driving or talking to someone)? 0  Has anyone observed you stop breathing during your sleep? 1  Do you have, or are you being treated for high blood pressure? 1  BMI more than 35 kg/m2? 0  Age > 50 (1-yes) 1  Neck circumference greater than:Male 16 inches or larger, Male 17inches or larger? 1  Male Gender (Yes=1) 1  Obstructive Sleep Apnea Score 5  Score 5 or greater  Results sent to PCP

## 2015-12-19 NOTE — Transfer of Care (Signed)
Immediate Anesthesia Transfer of Care Note  Patient: Dalton [name]  Procedure(s) Performed: Procedure(s): OPEN REDUCTION INTERNAL FIXATION (ORIF) MANDIBULAR FRACTURE (N/A)  Patient Location: PACU  Anesthesia Type:General  Level of Consciousness: awake, alert  and patient cooperative  Airway & Oxygen Therapy: Patient Spontanous Breathing and Patient connected to face mask oxygen  Post-op Assessment: Report given to RN, Post -op Vital signs reviewed and stable and Patient moving all extremities  Post vital signs: Reviewed and stable  Last Vitals:  Vitals:   12/19/15 0907 12/19/15 1243  BP: 118/83   Pulse: 61 76  Resp: 18 (!) 25  Temp: 37 C 36.1 C    Last Pain:  Vitals:   12/19/15 1007  TempSrc:   PainSc: 10-Worst pain ever      Patients Stated Pain Goal:  (pt. states he doesn't know) ([id number])  Complications: No apparent anesthesia complications

## 2015-12-19 NOTE — Anesthesia Procedure Notes (Signed)
Procedure Name: Intubation Date/Time: 12/19/2015 11:25 AM Performed by: Williemae Area B Pre-anesthesia Checklist: Patient identified, Emergency Drugs available, Suction available and Patient being monitored Patient Re-evaluated:Patient Re-evaluated prior to inductionOxygen Delivery Method: Circle system utilized Preoxygenation: Pre-oxygenation with 100% oxygen Intubation Type: IV induction Ventilation: Mask ventilation without difficulty Laryngoscope Size: Mac and 4 Grade View: Grade I Tube type: Oral Tube size: 7.5 mm Number of attempts: 1 Airway Equipment and Method: Stylet Placement Confirmation: ETT inserted through vocal cords under direct vision,  positive ETCO2 and breath sounds checked- equal and bilateral Secured at: 22 (cm at upper gum) cm Tube secured with: Tape Dental Injury: Teeth and Oropharynx as per pre-operative assessment

## 2015-12-19 NOTE — ED Notes (Signed)
Pt. woke up this morning and advised nurse that he is leaving and wait at the waiting area until his scheduled surgery in the morning .

## 2015-12-19 NOTE — Progress Notes (Signed)
12/19/2015 5:50 PM  Dalton [name] [id number]  Post-Op check   Temp:  [97 F (36.1 C)-98.6 F (37 C)] 98.2 F (36.8 C) (10/27 1657) Pulse Rate:  [61-95] 77 (10/27 1657) Resp:  [16-25] 16 (10/27 1657) BP: (106-128)/(75-86) 106/75 (10/27 1657) SpO2:  [95 %-100 %] 99 % (10/27 1657) Weight:  [99.3 kg (219 lb)] 99.3 kg (219 lb) (10/27 0907),     Intake/Output Summary (Last 24 hours) at 12/19/15 1750 Last data filed at 12/19/15 1716  Gross per 24 hour  Intake          1503.75 ml  Output              350 ml  Net          1153.75 ml    Results for orders placed or performed during the hospital encounter of 12/19/15 (from the past 24 hour(s))  Glucose, capillary     Status: Abnormal   Collection Time: 12/19/15  8:59 AM  Result Value Ref Range   Glucose-Capillary 101 (H) 65 - 99 mg/dL  I-STAT 4, (NA,K, GLUC, HGB,HCT)     Status: None   Collection Time: 12/19/15 10:54 AM  Result Value Ref Range   Sodium 137 135 - 145 mmol/L   Potassium 5.1 3.5 - 5.1 mmol/L   Glucose, Bld 83 65 - 99 mg/dL   HCT 41.0 39.0 - 52.0 %   Hemoglobin 13.9 13.0 - 17.0 g/dL  Glucose, capillary     Status: Abnormal   Collection Time: 12/19/15 12:46 PM  Result Value Ref Range   Glucose-Capillary 126 (H) 65 - 99 mg/dL   Comment 1 Notify RN    Comment 2 Document in Chart     SUBJECTIVE:  C/o large pain.  + void.  No SOB  OBJECTIVE:   Distressed.  Breathing well.  IMPRESSION:  Satisfactory check  PLAN:  Will add Oxycodone for pain relief.    Jodi Marble
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
## 2015-12-19 NOTE — Anesthesia Postprocedure Evaluation (Signed)
Anesthesia Post Note  Patient: Dalton [name]  Procedure(s) Performed: Procedure(s) (LRB): OPEN REDUCTION INTERNAL FIXATION (ORIF) MANDIBULAR FRACTURE (N/A)  Patient location during evaluation: PACU Anesthesia Type: General Level of consciousness: awake and alert Pain management: pain level controlled Vital Signs Assessment: post-procedure vital signs reviewed and stable Respiratory status: spontaneous breathing, nonlabored ventilation, respiratory function stable and patient connected to nasal cannula oxygen Cardiovascular status: blood pressure returned to baseline and stable Postop Assessment: no signs of nausea or vomiting Anesthetic complications: no    Last Vitals:  Vitals:   12/19/15 1345 12/19/15 1355  BP:  126/86  Pulse:  76  Resp:  18  Temp: 36.7 C 36.8 C    Last Pain:  Vitals:   12/19/15 1355  TempSrc: Axillary  PainSc:                  Marina Desire,JAMES TERRILL

## 2015-12-19 NOTE — Discharge Instructions (Signed)
Liquids and soft foods only. Take antibiotic medicine 3 times daily for a full 10 days.

## 2015-12-19 NOTE — Op Note (Signed)
OPERATIVE REPORT  DATE OF SURGERY: 12/19/2015  PATIENT:  Dalton [name],  55 y.o. male  PRE-OPERATIVE DIAGNOSIS:  closed mandible fx  POST-OPERATIVE DIAGNOSIS:  closed mandible fx  PROCEDURE:  Procedure(s): OPEN REDUCTION INTERNAL FIXATION (ORIF) MANDIBULAR FRACTURE  SURGEON:  Beckie Salts, MD  ASSISTANTS: none  ANESTHESIA:   General   EBL:  50 ml  DRAINS: none  LOCAL MEDICATIONS USED:  None  SPECIMEN:  none  COUNTS:  Correct  PROCEDURE DETAILS: The patient was taken to the operating room and placed on the operating table in the supine position. Following induction of general endotracheal anesthesia, the left side of the face and mouth was prepped and draped in a standard fashion. Inspection of palpation of the oral cavity and the mandible revealed that the fracture would be approachable from an intraoral approach. A cheek retractor was used throughout the case to facilitate exposure. Electrocautery used to incise the buccal mucosa and the ramus of the mandible was exposed with the fracture line. 2 plates were placed. A mandible compression plate, 4 hole with a spacer was used medially. 3 screws were used, 2 on the inferior portion and one on the superior portion there was good anatomic alignment of the ends of the fracture. A second miniplate was then used with 4 holes, and 4 screws on the anterior edge of the fracture. There was nice reapproximation and stability of the bones. The wound was irrigated with saline. The mucosal incision was reapproximated with a running 3-0 Vicryl. Patient was awakened and transferred to recovery in stable condition.    PATIENT DISPOSITION:  To PACU, stable

## 2015-12-19 NOTE — Anesthesia Preprocedure Evaluation (Addendum)
Anesthesia Evaluation  Patient identified by MRN, date of birth, ID band Patient awake  General Assessment Comment:Poor historian , no pcp, homeless  Reviewed: Allergy & Precautions, NPO status , Patient's Chart, lab work & pertinent test results  Airway Mallampati: III   Neck ROM: Full  Mouth opening: Limited Mouth Opening  Dental  (+) Edentulous Upper, Missing, Dental Advisory Given   Pulmonary shortness of breath, COPD,  Chronic bronchitis   breath sounds clear to auscultation       Cardiovascular hypertension, Pt. on medications and Pt. on home beta blockers + CAD, + Past MI, + Peripheral Vascular Disease and +CHF   Rhythm:Regular Rate:Normal     Neuro/Psych Seizures -, Well Controlled,  Keppra    GI/Hepatic PUD, GERD  ,(+)     substance abuse  cocaine use and marijuana use,   Endo/Other  diabetes  Renal/GU      Musculoskeletal   Abdominal   Peds  Hematology  (+) anemia ,   Anesthesia Other Findings   Reproductive/Obstetrics                            Anesthesia Physical Anesthesia Plan  ASA: IV  Anesthesia Plan: General   Post-op Pain Management:    Induction: Intravenous  Airway Management Planned: Nasal ETT  Additional Equipment:   Intra-op Plan:   Post-operative Plan: Extubation in OR  Informed Consent: I have reviewed the patients History and Physical, chart, labs and discussed the procedure including the risks, benefits and alternatives for the proposed anesthesia with the patient or authorized representative who has indicated his/her understanding and acceptance.   Dental advisory given  Plan Discussed with:   Anesthesia Plan Comments:         Anesthesia Quick Evaluation

## 2015-12-20 LAB — GLUCOSE, CAPILLARY: GLUCOSE-CAPILLARY: 109 mg/dL — AB (ref 65–99)

## 2015-12-20 MED ORDER — CHLORHEXIDINE GLUCONATE 0.12 % MT SOLN
5.0000 mL | Freq: Four times a day (QID) | OROMUCOSAL | Status: DC
  Administered 2015-12-20 – 2015-12-21 (×7): 5 mL via OROMUCOSAL
  Filled 2015-12-20 (×4): qty 15

## 2015-12-20 NOTE — Progress Notes (Signed)
12/20/2015 9:34 AM  Dalton [name] [id number]  Post-Op Day 1    Temp:  [97 F (36.1 C)-100 F (37.8 C)] 99 F (37.2 C) (10/28 0433) Pulse Rate:  [76-86] 77 (10/28 0433) Resp:  [16-25] 19 (10/28 0433) BP: (90-126)/(56-86) 90/56 (10/28 0433) SpO2:  [95 %-100 %] 98 % (10/28 0433),     Intake/Output Summary (Last 24 hours) at 12/20/15 0934 Last data filed at 12/20/15 0500  Gross per 24 hour  Intake          3333.75 ml  Output              783 ml  Net          2550.75 ml    Results for orders placed or performed during the hospital encounter of 12/19/15 (from the past 24 hour(s))  I-STAT 4, (NA,K, GLUC, HGB,HCT)     Status: None   Collection Time: 12/19/15 10:54 AM  Result Value Ref Range   Sodium 137 135 - 145 mmol/L   Potassium 5.1 3.5 - 5.1 mmol/L   Glucose, Bld 83 65 - 99 mg/dL   HCT 41.0 39.0 - 52.0 %   Hemoglobin 13.9 13.0 - 17.0 g/dL  Glucose, capillary     Status: Abnormal   Collection Time: 12/19/15 12:46 PM  Result Value Ref Range   Glucose-Capillary 126 (H) 65 - 99 mg/dL   Comment 1 Notify RN    Comment 2 Document in Chart     SUBJECTIVE:  Pt reports large pain.   Not ambulating well without his cane.  Taking po fluids slowly (nurses report excellent eating and drinking).  OBJECTIVE:  Mod LEFT facial swelling. Voice clear. Breathing well.   IMPRESSION:  Satisfactory check  PLAN:  PT eval for cane.  Social work eval for disposition.  Jodi Marble

## 2015-12-20 NOTE — Progress Notes (Signed)
PT Cancellation Note  Patient Details Name: Dalton [name] MRN: [id number] DOB: [birth year]   Cancelled Treatment:    Reason Eval/Treat Not Completed: Fatigue/lethargy limiting ability to participate. Pt very lethargic and only able to briefly arouse (3x for ~5 seconds). Pt's lethargy limiting his ability to safely participate in physical therapy evaluation at this time. PT will continue to f/u with pt as appropriate.   Clearnce Sorrel Jolene Guyett 12/20/2015, 3:15 PM Sherie Don, [place], DPT [phone]
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
## 2015-12-21 NOTE — Progress Notes (Signed)
12/21/2015 11:35 AM  Dalton [name] [id number]  Post-Op Day 2    Temp:  [98.6 F (37 C)-99.3 F (37.4 C)] 98.7 F (37.1 C) (10/29 0551) Pulse Rate:  [70-88] 88 (10/29 0551) Resp:  [17-18] 17 (10/29 0551) BP: (101-110)/(49-72) 101/53 (10/29 0551) SpO2:  [96 %-98 %] 97 % (10/29 0741) FiO2 (%):  [21 %] 21 % (10/29 0741),     Intake/Output Summary (Last 24 hours) at 12/21/15 1135 Last data filed at 12/21/15 0737  Gross per 24 hour  Intake           4543.5 ml  Output             4100 ml  Net            443.5 ml    Results for orders placed or performed during the hospital encounter of 12/19/15 (from the past 24 hour(s))  Glucose, capillary     Status: Abnormal   Collection Time: 12/20/15 12:45 PM  Result Value Ref Range   Glucose-Capillary 109 (H) 65 - 99 mg/dL    SUBJECTIVE:  Still large pain.  Breathing OK.  Swallowing down RIGHT side of mouth/pharynx.  Pt too sleepy for PT eval yest.  Pt reports disposition planned for tomorrow.  OBJECTIVE:  Less neck swelling.  Still trismus.  Voice cl, breathing well.  IMPRESSION:  Satisfactory check  PLAN:  No change in plans. Anticipate discharge in AM.  Erik Obey, Ileene Hutchinson

## 2015-12-22 ENCOUNTER — Encounter (HOSPITAL_COMMUNITY): Payer: Self-pay | Admitting: Otolaryngology

## 2015-12-22 MED ORDER — WHITE PETROLATUM GEL
Status: AC
  Filled 2015-12-22: qty 1

## 2015-12-22 NOTE — Discharge Summary (Signed)
Physician Discharge Summary  Patient ID: Dalton [name] MRN: [id number] DOB/AGE: [birth year] 54 y.o.  Admit date: 12/19/2015 Discharge date: 12/22/2015  Admission Diagnoses:mandible fracture  Discharge Diagnoses:  Active Problems:   Mandible fracture [facility])   Discharged Condition: fair  Hospital Course: no complications, held in hospital due to being homeless and awaiting placement.  Consults: none  Significant Diagnostic Studies: none  Treatments: surgery: ORIF mandible fracture  Discharge Exam: Blood pressure (!) 110/59, pulse 66, temperature 98.3 F (36.8 C), temperature source Oral, resp. rate 16, height 5\' 10"  (1.778 m), weight 99.3 kg (219 lb), SpO2 100 %. PHYSICAL EXAM: Moderate left cheek swelling, no signs of infection.  Disposition: 01-Home or Self Care  Discharge Instructions    Increase activity slowly    Complete by:  As directed       Follow-up Information    Calandria Mullings, MD. Schedule an appointment as soon as possible for a visit in 1 week.   Specialty:  Otolaryngology Contact information: [address] [place] [address] [phone]           Signed: Izora Gala 12/22/2015, 7:57 AM
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
## 2015-12-22 NOTE — Care Management Note (Signed)
Case Management Note  Patient Details  Name: Dalton [name] MRN: [id number] Date of Birth: 07-20-1960  Subjective/Objective:                    Action/Plan: Consult SW for shelter and transportation  Expected Discharge Date:                  Expected Discharge Plan:  Homeless Shelter  In-House Referral:  Clinical Social Work  Discharge planning Services  CM Consult  Post Acute Care Choice:  Durable Medical Equipment Choice offered to:  Patient  DME Arranged:  Kasandra Knudsen DME Agency:  [place]:    [place] Agency:     Status of Service:  Completed, signed off  If discussed at [employer], dates discussed:    Additional Comments:  Marilu Favre, RN 12/22/2015, 8:26 AM

## 2015-12-22 NOTE — Progress Notes (Signed)
SW was informed by nurse that pt needs resources for homeless shelter.   SW spoke with patient at bedside. Patient was alert and oriented. There was no family present. Patient states that upon discharge he is going to an Organization called "[facility]". He states his case manager Mar Daring has been speaking with him. He states that she will assist him with finding a motel for the night and eventually help him find a permanent place to live. SW provided pt with community resources for shelter and a bus pass. Patient has no questions at this time. SW made nurse aware.   Tilda Burrow, MSW [phone]

## 2015-12-22 NOTE — Care Management Obs Status (Signed)
[facility] NOTIFICATION   Patient Details  Name: AUGUSTE RAPONI MRN: [id number] Date of Birth: 11-Feb-1961   Medicare Observation Status Notification Given:  Yes    Marilu Favre, RN 12/22/2015, 8:25 AM

## 2015-12-24 ENCOUNTER — Emergency Department (HOSPITAL_COMMUNITY): Payer: Medicare Other

## 2015-12-24 ENCOUNTER — Observation Stay (HOSPITAL_COMMUNITY)
Admission: EM | Admit: 2015-12-24 | Discharge: 2015-12-25 | Disposition: A | Discharge status: Home / Self Care | Payer: Medicare Other | Attending: Internal Medicine | Admitting: Internal Medicine

## 2015-12-24 ENCOUNTER — Encounter (HOSPITAL_COMMUNITY): Payer: Self-pay | Admitting: Emergency Medicine

## 2015-12-24 DIAGNOSIS — Z833 Family history of diabetes mellitus: Secondary | ICD-10-CM | POA: Insufficient documentation

## 2015-12-24 DIAGNOSIS — I130 Hypertensive heart and chronic kidney disease with heart failure and stage 1 through stage 4 chronic kidney disease, or unspecified chronic kidney disease: Secondary | ICD-10-CM | POA: Diagnosis not present

## 2015-12-24 DIAGNOSIS — F1190 Opioid use, unspecified, uncomplicated: Secondary | ICD-10-CM | POA: Diagnosis not present

## 2015-12-24 DIAGNOSIS — R079 Chest pain, unspecified: Secondary | ICD-10-CM | POA: Diagnosis not present

## 2015-12-24 DIAGNOSIS — R000 Tachycardia, unspecified: Secondary | ICD-10-CM | POA: Diagnosis not present

## 2015-12-24 DIAGNOSIS — Z8711 Personal history of peptic ulcer disease: Secondary | ICD-10-CM | POA: Insufficient documentation

## 2015-12-24 DIAGNOSIS — N182 Chronic kidney disease, stage 2 (mild): Secondary | ICD-10-CM | POA: Diagnosis not present

## 2015-12-24 DIAGNOSIS — R0789 Other chest pain: Principal | ICD-10-CM | POA: Insufficient documentation

## 2015-12-24 DIAGNOSIS — K219 Gastro-esophageal reflux disease without esophagitis: Secondary | ICD-10-CM | POA: Diagnosis not present

## 2015-12-24 DIAGNOSIS — I10 Essential (primary) hypertension: Secondary | ICD-10-CM

## 2015-12-24 DIAGNOSIS — Z7982 Long term (current) use of aspirin: Secondary | ICD-10-CM | POA: Diagnosis not present

## 2015-12-24 DIAGNOSIS — E1151 Type 2 diabetes mellitus with diabetic peripheral angiopathy without gangrene: Secondary | ICD-10-CM | POA: Insufficient documentation

## 2015-12-24 DIAGNOSIS — I252 Old myocardial infarction: Secondary | ICD-10-CM | POA: Diagnosis not present

## 2015-12-24 DIAGNOSIS — F329 Major depressive disorder, single episode, unspecified: Secondary | ICD-10-CM | POA: Diagnosis not present

## 2015-12-24 DIAGNOSIS — R569 Unspecified convulsions: Secondary | ICD-10-CM | POA: Insufficient documentation

## 2015-12-24 DIAGNOSIS — F419 Anxiety disorder, unspecified: Secondary | ICD-10-CM | POA: Diagnosis not present

## 2015-12-24 DIAGNOSIS — M1990 Unspecified osteoarthritis, unspecified site: Secondary | ICD-10-CM | POA: Insufficient documentation

## 2015-12-24 DIAGNOSIS — I2510 Atherosclerotic heart disease of native coronary artery without angina pectoris: Secondary | ICD-10-CM | POA: Insufficient documentation

## 2015-12-24 DIAGNOSIS — M549 Dorsalgia, unspecified: Secondary | ICD-10-CM | POA: Insufficient documentation

## 2015-12-24 DIAGNOSIS — E7800 Pure hypercholesterolemia, unspecified: Secondary | ICD-10-CM | POA: Diagnosis not present

## 2015-12-24 DIAGNOSIS — I509 Heart failure, unspecified: Secondary | ICD-10-CM | POA: Insufficient documentation

## 2015-12-24 DIAGNOSIS — I493 Ventricular premature depolarization: Secondary | ICD-10-CM | POA: Insufficient documentation

## 2015-12-24 DIAGNOSIS — E1122 Type 2 diabetes mellitus with diabetic chronic kidney disease: Secondary | ICD-10-CM | POA: Diagnosis not present

## 2015-12-24 DIAGNOSIS — Z7902 Long term (current) use of antithrombotics/antiplatelets: Secondary | ICD-10-CM | POA: Insufficient documentation

## 2015-12-24 DIAGNOSIS — Z886 Allergy status to analgesic agent status: Secondary | ICD-10-CM | POA: Insufficient documentation

## 2015-12-24 DIAGNOSIS — E1159 Type 2 diabetes mellitus with other circulatory complications: Secondary | ICD-10-CM | POA: Diagnosis present

## 2015-12-24 DIAGNOSIS — G8929 Other chronic pain: Secondary | ICD-10-CM | POA: Diagnosis not present

## 2015-12-24 DIAGNOSIS — Z888 Allergy status to other drugs, medicaments and biological substances status: Secondary | ICD-10-CM | POA: Insufficient documentation

## 2015-12-24 DIAGNOSIS — F1420 Cocaine dependence, uncomplicated: Secondary | ICD-10-CM | POA: Diagnosis not present

## 2015-12-24 DIAGNOSIS — Z7984 Long term (current) use of oral hypoglycemic drugs: Secondary | ICD-10-CM | POA: Insufficient documentation

## 2015-12-24 DIAGNOSIS — Z590 Homelessness: Secondary | ICD-10-CM | POA: Insufficient documentation

## 2015-12-24 DIAGNOSIS — Z951 Presence of aortocoronary bypass graft: Secondary | ICD-10-CM | POA: Insufficient documentation

## 2015-12-24 DIAGNOSIS — Z8249 Family history of ischemic heart disease and other diseases of the circulatory system: Secondary | ICD-10-CM | POA: Insufficient documentation

## 2015-12-24 HISTORY — DX: Low back pain, unspecified: M54.50

## 2015-12-24 HISTORY — DX: Bipolar disorder, unspecified: F31.9

## 2015-12-24 HISTORY — DX: Adverse effect of other nonsteroidal anti-inflammatory drugs (NSAID), initial encounter: T39.395A

## 2015-12-24 HISTORY — DX: Type 2 diabetes mellitus without complications: E11.9

## 2015-12-24 HISTORY — DX: Gastric ulcer, unspecified as acute or chronic, without hemorrhage or perforation: K25.9

## 2015-12-24 HISTORY — DX: Other generalized epilepsy and epileptic syndromes, not intractable, without status epilepticus: G40.409

## 2015-12-24 HISTORY — DX: Acute embolism and thrombosis of unspecified deep veins of unspecified lower extremity: I82.409

## 2015-12-24 HISTORY — DX: Low back pain: M54.5

## 2015-12-24 HISTORY — DX: Rheumatoid arthritis, unspecified: M06.9

## 2015-12-24 HISTORY — DX: Other chronic pain: G89.29

## 2015-12-24 LAB — RAPID URINE DRUG SCREEN, HOSP PERFORMED
Amphetamines: NOT DETECTED
Barbiturates: NOT DETECTED
Benzodiazepines: NOT DETECTED
Cocaine: POSITIVE — AB
OPIATES: POSITIVE — AB
TETRAHYDROCANNABINOL: NOT DETECTED

## 2015-12-24 LAB — I-STAT TROPONIN, ED: Troponin i, poc: 0 ng/mL (ref 0.00–0.08)

## 2015-12-24 LAB — CBC
HCT: 32.6 % — ABNORMAL LOW (ref 39.0–52.0)
Hemoglobin: 11.5 g/dL — ABNORMAL LOW (ref 13.0–17.0)
MCH: 28.8 pg (ref 26.0–34.0)
MCHC: 35.3 g/dL (ref 30.0–36.0)
MCV: 81.7 fL (ref 78.0–100.0)
PLATELETS: 259 10*3/uL (ref 150–400)
RBC: 3.99 MIL/uL — ABNORMAL LOW (ref 4.22–5.81)
RDW: 13.1 % (ref 11.5–15.5)
WBC: 7.8 10*3/uL (ref 4.0–10.5)

## 2015-12-24 LAB — BASIC METABOLIC PANEL
Anion gap: 12 (ref 5–15)
BUN: 8 mg/dL (ref 6–20)
CO2: 23 mmol/L (ref 22–32)
CREATININE: 1.59 mg/dL — AB (ref 0.61–1.24)
Calcium: 9.5 mg/dL (ref 8.9–10.3)
Chloride: 102 mmol/L (ref 101–111)
GFR calc Af Amer: 55 mL/min — ABNORMAL LOW (ref >60)
GFR, EST NON AFRICAN AMERICAN: 48 mL/min — AB (ref >60)
Glucose, Bld: 121 mg/dL — ABNORMAL HIGH (ref 65–99)
Potassium: 4 mmol/L (ref 3.5–5.1)
SODIUM: 137 mmol/L (ref 135–145)

## 2015-12-24 MED ORDER — ASPIRIN 81 MG PO CHEW
81.0000 mg | CHEWABLE_TABLET | Freq: Every day | ORAL | Status: DC
  Administered 2015-12-25: 81 mg via ORAL
  Filled 2015-12-24: qty 1

## 2015-12-24 MED ORDER — CLOPIDOGREL BISULFATE 75 MG PO TABS
75.0000 mg | ORAL_TABLET | Freq: Every day | ORAL | Status: DC
  Administered 2015-12-25: 75 mg via ORAL
  Filled 2015-12-24: qty 1

## 2015-12-24 MED ORDER — LORAZEPAM 1 MG PO TABS
1.0000 mg | ORAL_TABLET | Freq: Once | ORAL | Status: AC
Start: 2015-12-24 — End: 2015-12-24
  Administered 2015-12-24: 1 mg via ORAL
  Filled 2015-12-24: qty 1

## 2015-12-24 MED ORDER — ONDANSETRON HCL 4 MG/2ML IJ SOLN: 4.0000 mg | Freq: Four times a day (QID) | INTRAMUSCULAR | Status: DC | PRN

## 2015-12-24 MED ORDER — MORPHINE SULFATE (PF) 4 MG/ML IV SOLN
2.0000 mg | INTRAVENOUS | Status: DC | PRN
  Administered 2015-12-25: 2 mg via INTRAVENOUS
  Filled 2015-12-24: qty 1

## 2015-12-24 MED ORDER — LEVETIRACETAM 500 MG PO TABS
500.0000 mg | ORAL_TABLET | Freq: Two times a day (BID) | ORAL | Status: DC
  Administered 2015-12-24 – 2015-12-25 (×2): 500 mg via ORAL
  Filled 2015-12-24 (×2): qty 1

## 2015-12-24 MED ORDER — NITROGLYCERIN 0.4 MG SL SUBL
0.4000 mg | SUBLINGUAL_TABLET | SUBLINGUAL | Status: DC | PRN
  Administered 2015-12-24: 0.4 mg via SUBLINGUAL
  Filled 2015-12-24: qty 1

## 2015-12-24 MED ORDER — ENOXAPARIN SODIUM 40 MG/0.4ML ~~LOC~~ SOLN
40.0000 mg | Freq: Every day | SUBCUTANEOUS | Status: DC
  Administered 2015-12-24: 40 mg via SUBCUTANEOUS
  Filled 2015-12-24: qty 0.4

## 2015-12-24 MED ORDER — METFORMIN HCL 500 MG PO TABS
500.0000 mg | ORAL_TABLET | Freq: Every day | ORAL | Status: DC
  Administered 2015-12-25: 500 mg via ORAL
  Filled 2015-12-24: qty 1

## 2015-12-24 MED ORDER — SODIUM CHLORIDE 0.9 % IV BOLUS (SEPSIS)
500.0000 mL | Freq: Once | INTRAVENOUS | Status: AC
  Administered 2015-12-24: 500 mL via INTRAVENOUS

## 2015-12-24 MED ORDER — ACETAMINOPHEN 325 MG PO TABS
650.0000 mg | ORAL_TABLET | ORAL | Status: DC | PRN
  Administered 2015-12-24: 650 mg via ORAL
  Filled 2015-12-24: qty 2

## 2015-12-24 NOTE — ED Notes (Signed)
Pt returned from x-ray and placed back on monitor.  

## 2015-12-24 NOTE — ED Notes (Signed)
Attempted to call report
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
## 2015-12-24 NOTE — ED Triage Notes (Signed)
Per EMS: Pt did Cocaine and drank some alcohol today around 3.  Pt is c/o chest pain. Pt has a Hx or drug use and heart failure.  Gave 325 of aspirin and nitro twice. Chest pain subsided.  Pt denies and Viagra use today or last night.   BP 118/68 HR 131 SPO2 96 % CBG 154
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
## 2015-12-24 NOTE — ED Notes (Signed)
Pt transported to xray 

## 2015-12-25 ENCOUNTER — Observation Stay (HOSPITAL_COMMUNITY): Payer: Medicare Other

## 2015-12-25 ENCOUNTER — Observation Stay (HOSPITAL_BASED_OUTPATIENT_CLINIC_OR_DEPARTMENT_OTHER): Payer: Medicare Other

## 2015-12-25 DIAGNOSIS — R071 Chest pain on breathing: Secondary | ICD-10-CM

## 2015-12-25 DIAGNOSIS — R079 Chest pain, unspecified: Secondary | ICD-10-CM

## 2015-12-25 DIAGNOSIS — R0789 Other chest pain: Secondary | ICD-10-CM | POA: Diagnosis not present

## 2015-12-25 LAB — TROPONIN I
Troponin I: <0.03 ng/mL (ref <0.03)
Troponin I: <0.03 ng/mL (ref <0.03)

## 2015-12-25 LAB — NM MYOCAR MULTI W/SPECT W/WALL MOTION / EF
CHL CUP MPHR: 166 {beats}/min
CSEPEW: 1 METS
CSEPHR: 62 %
Peak HR: 103 {beats}/min
Rest HR: 83 {beats}/min

## 2015-12-25 LAB — GLUCOSE, CAPILLARY
Glucose-Capillary: 92 mg/dL (ref 65–99)
Glucose-Capillary: 111 mg/dL — ABNORMAL HIGH (ref 65–99)

## 2015-12-25 MED ORDER — REGADENOSON 0.4 MG/5ML IV SOLN
INTRAVENOUS | Status: AC
  Filled 2015-12-25: qty 5

## 2015-12-25 MED ORDER — ENSURE ENLIVE PO LIQD
237.0000 mL | Freq: Two times a day (BID) | ORAL | Status: DC
  Administered 2015-12-25: 237 mL via ORAL

## 2015-12-25 MED ORDER — TECHNETIUM TC 99M TETROFOSMIN IV KIT
30.0000 | PACK | Freq: Once | INTRAVENOUS | Status: AC | PRN
  Administered 2015-12-25: 30 via INTRAVENOUS

## 2015-12-25 MED ORDER — REGADENOSON 0.4 MG/5ML IV SOLN
0.4000 mg | Freq: Once | INTRAVENOUS | Status: AC
  Administered 2015-12-25: 0.4 mg via INTRAVENOUS
  Filled 2015-12-25: qty 5

## 2015-12-25 MED ORDER — TECHNETIUM TC 99M TETROFOSMIN IV KIT
10.0000 | PACK | Freq: Once | INTRAVENOUS | Status: AC | PRN
  Administered 2015-12-25: 10 via INTRAVENOUS

## 2015-12-25 NOTE — Progress Notes (Signed)
Nutrition Brief Note  Patient identified on the Malnutrition Screening Tool (MST) Report.  Per readings below, patient has had a 7% weight loss since August 2017; not significant for time frame.  Wt Readings from Last 15 Encounters:  12/24/15 219 lb (99.3 kg)  12/19/15 219 lb (99.3 kg)  12/18/15 219 lb (99.3 kg)  11/12/15 228 lb 2.8 oz (103.5 kg)  10/10/15 235 lb (106.6 kg)  01/25/15 242 lb 11.2 oz (110.1 kg)  10/01/14 246 lb 9.6 oz (111.9 kg)  11/30/13 250 lb (113.4 kg)  09/03/13 232 lb (105.2 kg)  08/10/13 225 lb (102.1 kg)  02/06/13 230 lb (104.3 kg)  10/05/12 247 lb (112 kg)  09/07/12 250 lb (113.4 kg)  05/30/12 250 lb (113.4 kg)  05/23/12 250 lb 6.4 oz (113.6 kg)    Body mass index is 31.42 kg/m. Patient meets criteria for Obesity Class I based on current BMI.   Current diet order is Heart Healthy, patient is consuming approximately 100% of meals at this time. Labs and medications reviewed.   No nutrition interventions warranted at this time. If nutrition issues arise, please consult RD.   Arthur Holms, RD, LDN Pager #: [phone] After-Hours Pager #: [phone]
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
## 2015-12-25 NOTE — Discharge Instructions (Addendum)
Follow with Philis Fendt, MD in 5-7 days  Please get a complete blood count and chemistry panel checked by your Primary MD at your next visit, and again as instructed by your Primary MD. Please get your medications reviewed and adjusted by your Primary MD.  Please discuss with your primary MD in 1-2 weeks regarding resumption of you Lisinopril  Please request your Primary MD to go over all Hospital Tests and Procedure/Radiological results at the follow up, please get all Hospital records sent to your Prim MD by signing hospital release before you go home.  If you had Pneumonia of Lung problems at the Hospital: Please get a 2 view Chest X ray done in 6-8 weeks after hospital discharge or sooner if instructed by your Primary MD.  If you have Congestive Heart Failure: Please call your Cardiologist or Primary MD anytime you have any of the following symptoms:  1) 3 pound weight gain in 24 hours or 5 pounds in 1 week  2) shortness of breath, with or without a dry hacking cough  3) swelling in the hands, feet or stomach  4) if you have to sleep on extra pillows at night in order to breathe  Follow cardiac low salt diet and 1.5 lit/day fluid restriction.  If you have diabetes Accuchecks 4 times/day, Once in AM empty stomach and then before each meal. Log in all results and show them to your primary doctor at your next visit. If any glucose reading is under 80 or above 300 call your primary MD immediately.  If you have Seizure/Convulsions/Epilepsy: Please do not drive, operate heavy machinery, participate in activities at heights or participate in high speed sports until you have seen by Primary MD or a Neurologist and advised to do so again.  If you had Gastrointestinal Bleeding: Please ask your Primary MD to check a complete blood count within one week of discharge or at your next visit. Your endoscopic/colonoscopic biopsies that are pending at the time of discharge, will also need to  followed by your Primary MD.  Get Medicines reviewed and adjusted. Please take all your medications with you for your next visit with your Primary MD  Please request your Primary MD to go over all hospital tests and procedure/radiological results at the follow up, please ask your Primary MD to get all Hospital records sent to his/her office.  If you experience worsening of your admission symptoms, develop shortness of breath, life threatening emergency, suicidal or homicidal thoughts you must seek medical attention immediately by calling 911 or calling your MD immediately  if symptoms less severe.  You must read complete instructions/literature along with all the possible adverse reactions/side effects for all the Medicines you take and that have been prescribed to you. Take any new Medicines after you have completely understood and accpet all the possible adverse reactions/side effects.   Do not drive or operate heavy machinery when taking Pain medications.   Do not take more than prescribed Pain, Sleep and Anxiety Medications  Special Instructions: If you have smoked or chewed Tobacco  in the last 2 yrs please stop smoking, stop any regular Alcohol  and or any Recreational drug use.  Wear Seat belts while driving.  Please note You were cared for by a hospitalist during your hospital stay. If you have any questions about your discharge medications or the care you received while you were in the hospital after you are discharged, you can call the unit and asked to speak with  the hospitalist on call if the hospitalist that took care of you is not available. Once you are discharged, your primary care physician will handle any further medical issues. Please note that NO REFILLS for any discharge medications will be authorized once you are discharged, as it is imperative that you return to your primary care physician (or establish a relationship with a primary care physician if you do not have one)  for your aftercare needs so that they can reassess your need for medications and monitor your lab values.  You can reach the hospitalist office at phone [phone] or fax [phone]   If you do not have a primary care physician, you can call [phone] for a physician referral.  Activity: As tolerated with Full fall precautions use walker/cane & assistance as needed  Diet: regular  Disposition Home

## 2015-12-25 NOTE — Progress Notes (Signed)
Patient dc'd his own IV. He refused to sign and take his discharge papers. He would not listen to his discharge instructions. He stated that we lost his shoes. I told him we were calling the ED to find them and he refused to wait. The ED stated that they had one of his black shoes and were looking for the other. The patient was hollering and cursing at the main desk and then he left on his own, refusing any help.

## 2015-12-25 NOTE — Progress Notes (Signed)
Patient's nuclear stress test came back low risk without ischemia. I had plans to notify the primary team, but saw that the patient left AMA.

## 2015-12-25 NOTE — Progress Notes (Signed)
The patient was seen in nuclear medicine for a Lexiscan myoview. He tolerated the procedure well. No acute ST or TW changes on ECG.   Jettie Booze, NP  [facility]

## 2015-12-26 ENCOUNTER — Emergency Department (HOSPITAL_COMMUNITY)
Admission: EM | Admit: 2015-12-26 | Discharge: 2015-12-29 | Disposition: A | Discharge status: Other Acute Inpatient Hospital | Payer: Medicare Other | Attending: Emergency Medicine | Admitting: Emergency Medicine

## 2015-12-26 ENCOUNTER — Encounter (HOSPITAL_COMMUNITY): Payer: Self-pay | Admitting: Emergency Medicine

## 2015-12-26 DIAGNOSIS — I2510 Atherosclerotic heart disease of native coronary artery without angina pectoris: Secondary | ICD-10-CM | POA: Diagnosis not present

## 2015-12-26 DIAGNOSIS — I252 Old myocardial infarction: Secondary | ICD-10-CM | POA: Diagnosis not present

## 2015-12-26 DIAGNOSIS — Z79899 Other long term (current) drug therapy: Secondary | ICD-10-CM | POA: Diagnosis not present

## 2015-12-26 DIAGNOSIS — E119 Type 2 diabetes mellitus without complications: Secondary | ICD-10-CM | POA: Diagnosis not present

## 2015-12-26 DIAGNOSIS — Z833 Family history of diabetes mellitus: Secondary | ICD-10-CM | POA: Diagnosis not present

## 2015-12-26 DIAGNOSIS — Z7984 Long term (current) use of oral hypoglycemic drugs: Secondary | ICD-10-CM | POA: Insufficient documentation

## 2015-12-26 DIAGNOSIS — Z888 Allergy status to other drugs, medicaments and biological substances status: Secondary | ICD-10-CM | POA: Diagnosis not present

## 2015-12-26 DIAGNOSIS — F1414 Cocaine abuse with cocaine-induced mood disorder: Secondary | ICD-10-CM | POA: Diagnosis not present

## 2015-12-26 DIAGNOSIS — I110 Hypertensive heart disease with heart failure: Secondary | ICD-10-CM | POA: Diagnosis not present

## 2015-12-26 DIAGNOSIS — F332 Major depressive disorder, recurrent severe without psychotic features: Secondary | ICD-10-CM

## 2015-12-26 DIAGNOSIS — R45851 Suicidal ideations: Secondary | ICD-10-CM | POA: Diagnosis present

## 2015-12-26 DIAGNOSIS — Z7982 Long term (current) use of aspirin: Secondary | ICD-10-CM | POA: Diagnosis not present

## 2015-12-26 DIAGNOSIS — Z8249 Family history of ischemic heart disease and other diseases of the circulatory system: Secondary | ICD-10-CM | POA: Diagnosis not present

## 2015-12-26 DIAGNOSIS — Z955 Presence of coronary angioplasty implant and graft: Secondary | ICD-10-CM | POA: Insufficient documentation

## 2015-12-26 DIAGNOSIS — I509 Heart failure, unspecified: Secondary | ICD-10-CM | POA: Insufficient documentation

## 2015-12-26 LAB — COMPREHENSIVE METABOLIC PANEL
ALK PHOS: 71 U/L (ref 38–126)
ALT: 19 U/L (ref 17–63)
ANION GAP: 11 (ref 5–15)
AST: 31 U/L (ref 15–41)
Albumin: 5.1 g/dL — ABNORMAL HIGH (ref 3.5–5.0)
BUN: 21 mg/dL — ABNORMAL HIGH (ref 6–20)
CALCIUM: 9.9 mg/dL (ref 8.9–10.3)
CO2: 25 mmol/L (ref 22–32)
CREATININE: 1.95 mg/dL — AB (ref 0.61–1.24)
Chloride: 103 mmol/L (ref 101–111)
GFR, EST AFRICAN AMERICAN: 43 mL/min — AB (ref >60)
GFR, EST NON AFRICAN AMERICAN: 37 mL/min — AB (ref >60)
Glucose, Bld: 104 mg/dL — ABNORMAL HIGH (ref 65–99)
Potassium: 3.6 mmol/L (ref 3.5–5.1)
SODIUM: 139 mmol/L (ref 135–145)
TOTAL PROTEIN: 9.1 g/dL — AB (ref 6.5–8.1)
Total Bilirubin: 1.3 mg/dL — ABNORMAL HIGH (ref 0.3–1.2)

## 2015-12-26 LAB — CBC
HCT: 34.4 % — ABNORMAL LOW (ref 39.0–52.0)
HEMOGLOBIN: 12.2 g/dL — AB (ref 13.0–17.0)
MCH: 28.8 pg (ref 26.0–34.0)
MCHC: 35.5 g/dL (ref 30.0–36.0)
MCV: 81.1 fL (ref 78.0–100.0)
PLATELETS: 284 10*3/uL (ref 150–400)
RBC: 4.24 MIL/uL (ref 4.22–5.81)
RDW: 13.2 % (ref 11.5–15.5)
WBC: 8.1 10*3/uL (ref 4.0–10.5)

## 2015-12-26 LAB — SALICYLATE LEVEL

## 2015-12-26 LAB — ETHANOL

## 2015-12-26 LAB — ACETAMINOPHEN LEVEL

## 2015-12-26 NOTE — ED Notes (Signed)
Patient made aware urine sample is needed. Urinal placed at bedside. Patient encouraged to void when able.
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
## 2015-12-26 NOTE — ED Notes (Signed)
MD at bedside. 
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
## 2015-12-26 NOTE — ED Notes (Signed)
Was just at [facility] per patient.

## 2015-12-26 NOTE — ED Notes (Signed)
Bed: WA04 Expected date:  Expected time:  Means of arrival:  Comments: IVC
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
## 2015-12-26 NOTE — ED Triage Notes (Addendum)
Patient is having suicidal ideations. [place] and found by [employer]. IVC paperwork is being taken out on this patient. Denies HI or auditory/visual hallucinations. Patient reports doing cocaine this morning. Denies any other substance use.

## 2015-12-26 NOTE — BH Assessment (Addendum)
Assessment Note  Dalton [name] is an 55 y.o. male that presents this date transported by [facility] under IVC after patient was [place] the middle [place] the [place] ([address]) attempting to run into traffic. Patient is very agitated and is refuses to answer questions associated with assessment although this Probation officer did gather information from patient informing this Probation officer of his current "needs." Information was also gathered from prior admission notes. Patient reports symptoms including continued daily drug use (cocaine for the last two weeks, patient was vague in reference to amount). Patient reports "frustration with the hospital not giving him help" stating he has frequent "crying spells", social withdrawal, loss of interest in usual pleasures, fatigue and drug use that he cannot stop. Patient is time/place oriented but is very angry and speaks loudly to this Probation officer. He states he has not slept "in days" and refuses to take medication "for anything." He reports current suicidal ideation with plan to walk into traffic again or overdose when released. Patient reports he has attempted suicide in the past by cutting his wrist and walking into traffic. Patient has multiple admissions to [facility] with the last one on 11/21/15 for S/I. Patient currently admits to S/I but denies any H/I or AVH. Patient denies auditory or visual hallucinations. Pt reports using an unknown quantity of cocaine yesterday attempting to overdose. Per note review, patient has been psychiatrically hospitalized several times in the past at [facility], [facility] and [facility]. Patient's  mood is depressed and affect is congruent with mood. Thought process is coherent and relevant. Pt stated several times he was having difficulty concentrating, which he attributed to not sleeping for three days. There is no indication patient is currently responding to internal stimuli or experiencing delusional thought content. Case was staffed with Reita Cliche DNP who  recommended patient be re-evaluated in the a.m.     Diagnosis:  Major Depressive Disorder, Recurrent Severe Without Psychotic Features; Cocaine Use Disorder   Past Medical History:  Past Medical History:  Diagnosis Date  . Anemia   . Anxiety   . Bipolar disorder ([place])   . CHF (congestive heart failure) ([place])   . Chronic back pain    Pain Clinic in [place]  . Chronic bronchitis   . Chronic lower back pain   . Congestive heart failure (CHF) ([place])   . Coronary artery disease   . Depression   . DVT (deep venous thrombosis) ([place]) ~ 2005   LLE  . Frequency of urination   . GERD (gastroesophageal reflux disease)   . 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 [id number])    "a few times/week" (12/24/2015)  . High cholesterol   . Hypertension   . Laceration of right hand 11/27/2010  . Laceration of wrist 2007 BIL FOREARMS  . MI (myocardial infarction)    7, last one was in 2011 (12/24/2015)  . NSAID-induced gastric ulcer    "Ibuprofen"  . PUD (peptic ulcer disease)    in 1990s, secondary to medication  . Rheumatoid arthritis ([place])    "all over" (12/24/2015)  . Shortness of breath    with exertion  . Tonsillitis, chronic    Dr. Vicki Mallet in [place]  . Type II diabetes mellitus ([place])     Past Surgical History:  Procedure Laterality Date  . BACK SURGERY    . BIOPSY N/A 05/30/2012   Procedure: BIOPSY;  Surgeon: Danie Binder, MD;  Location: [facility];  Service: Endoscopy;  Laterality: N/A;  . CARDIAC CATHETERIZATION  "several"  . CARPAL TUNNEL RELEASE Bilateral   . COLONOSCOPY  12/28/10   SLF: (MAC)Internal hemorrhoids/four small colon polyps  . CORONARY ARTERY BYPASS GRAFT  2002   3 vessels  . ESOPHAGOGASTRODUODENOSCOPY N/A 05/30/2012   SLF: UNCONTROLLED GERD DUE TO LIFESTYLE CHOICE/WEIGHT GAIN/MILD Non-erosive gastritis  . FRACTURE SURGERY    . LEFT HEART CATHETERIZATION WITH CORONARY ANGIOGRAM  N/A 08/31/2011   Procedure: LEFT HEART CATHETERIZATION WITH CORONARY ANGIOGRAM;  Surgeon: Laverda Page, MD;  Location: [facility] CATH LAB;  Service: Cardiovascular;  Laterality: N/A;  . LUMBAR DISC SURGERY     "L4-5; Dr. Trenton Gammon"  . ORIF MANDIBULAR FRACTURE N/A 12/19/2015   Procedure: OPEN REDUCTION INTERNAL FIXATION (ORIF) MANDIBULAR FRACTURE;  Surgeon: Izora Gala, MD;  Location: [facility];  Service: ENT;  Laterality: N/A;  . PATELLA FRACTURE SURGERY Left 1976   plate to knee cap from accident  . SAVORY DILATION  12/28/2010   SLF:(MAC)J-shaped stomach/nodular mocosa in the distal esophagus/empiric dilation 31mm    Family History:  Family History  Problem Relation Age of Onset  . Diabetes Mother   . Hypertension Mother   . Heart attack Mother   . Hypertension Father   . Diabetes Father   . Heart attack Father   . Heart attack      mother, father, brother, sister all deceased due to MI  . Heart attack Sister   . Heart attack Brother   . Seizures Brother   . Heart failure Other   . Colon cancer Neg Hx   . Liver disease Neg Hx   . Anesthesia problems Neg Hx   . Hypotension Neg Hx   . Malignant hyperthermia Neg Hx   . Pseudochol deficiency Neg Hx   . Colon polyps Neg Hx     Social History:  reports that he has never smoked. He has never used smokeless tobacco. He reports that he drinks alcohol. He reports that he uses drugs, including "Crack" cocaine, Cocaine, and Marijuana.  Additional Social History:  Alcohol / Drug Use Pain Medications: See MAR list Prescriptions: See [facility] list Over the Counter: See PTA list History of alcohol / drug use?: Yes Longest period of sobriety (when/how long): about 10-15 years Negative Consequences of Use: Personal relationships, Financial Withdrawal Symptoms: Agitation, Tremors Substance #1 Name of Substance 1: Crack 1 - Age of First Use: UTA 1 - Amount (size/oz): 1 to 2 grams three to four times a week 1 - Frequency: daily for the last two weeks 1  - Duration: Last two weeks  1 - Last Use / Amount: 11/24/15 1 gram or more  CIWA: CIWA-Ar BP: 143/83 Pulse Rate: 105 COWS:    Allergies:  Allergies  Allergen Reactions  . Gabapentin Hives  . Ibuprofen Other (See Comments)    REACTION:Stomach  Upset and stomach ulcers  . Zolpidem Tartrate Other (See Comments)    REACTION: Hallucinations   . Naproxen Other (See Comments)    HALLUCINATIONS    Home Medications:  (Not in a hospital admission)  OB/GYN Status:  No LMP for male patient.  General Assessment Data Location of Assessment: [facility] ED TTS Assessment: In system Is this a Tele or Face-to-Face Assessment?: Face-to-Face Is this an Initial Assessment or a Re-assessment for this encounter?: Initial Assessment Marital status: Single Maiden name: na Is patient pregnant?: No Pregnancy Status: No Living Arrangements: Alone Can pt return to current living arrangement?: Yes Admission  Status: Involuntary Is patient capable of signing voluntary admission?: Yes Referral Source: Other ([employer]) Insurance type: [employer] Screening Exam ([place]) Medical Exam completed: Yes  Crisis Care Plan Living Arrangements: Alone Legal Guardian:  (na) Name of Psychiatrist: None Name of Therapist: None  Education Status Is patient currently in school?: No Current Grade: na Highest grade of school patient has completed: 10 Name of school: na Contact person: na  Risk to self with the past 6 months Suicidal Ideation: Yes-Currently Present Has patient been a risk to self within the past 6 months prior to admission? : Yes Suicidal Intent: Yes-Currently Present Has patient had any suicidal intent within the past 6 months prior to admission? : Yes Is patient at risk for suicide?: Yes Suicidal Plan?: Yes-Currently Present Has patient had any suicidal plan within the past 6 months prior to admission? : Yes Specify Current Suicidal Plan: run into traffic Access to Means:  Yes Specify Access to Suicidal Means: pt was found in highway What has been your use of drugs/alcohol within the last 12 months?: Current use Previous Attempts/Gestures: Yes How many times?:  (Mutiple) Other Self Harm Risks: none Triggers for Past Attempts: Unknown Intentional Self Injurious Behavior: None Comment - Self Injurious Behavior: none Family Suicide History: No Recent stressful life event(s): Other (Comment) (job loss) Persecutory voices/beliefs?: No Depression: Yes Depression Symptoms: Loss of interest in usual pleasures, Feeling worthless/self pity Substance abuse history and/or treatment for substance abuse?: Yes Suicide prevention information given to non-admitted patients: Not applicable  Risk to Others within the past 6 months Homicidal Ideation: No Does patient have any lifetime risk of violence toward others beyond the six months prior to admission? : Yes (comment) (assaultive behaviors in the past) Thoughts of Harm to Others: No Comment - Thoughts of Harm to Others: na Current Homicidal Intent: No Current Homicidal Plan: No Access to Homicidal Means: No Identified Victim: na History of harm to others?: No Assessment of Violence: None Noted Violent Behavior Description: na Does patient have access to weapons?: No Criminal Charges Pending?: No Does patient have a court date: No Is patient on probation?: No  Psychosis Hallucinations: None noted Delusions: None noted  Mental Status Report Appearance/Hygiene: In scrubs Eye Contact: Fair Motor Activity: Agitation Speech: Aggressive, Abusive Level of Consciousness: Alert Mood: Irritable Affect: Angry Anxiety Level: Moderate Thought Processes: Circumstantial Judgement: Unimpaired Orientation: Person, Place, Time Obsessive Compulsive Thoughts/Behaviors: None  Cognitive Functioning Concentration: Normal Memory: Recent Intact IQ: Average Insight: Poor Impulse Control: Poor Appetite: Fair Weight  Loss: 0 Weight Gain: 0 Sleep: Decreased Total Hours of Sleep: 3 Vegetative Symptoms: None  ADLScreening [facility]) Patient's cognitive ability adequate to safely complete daily activities?: Yes Patient able to express need for assistance with ADLs?: Yes Independently performs ADLs?: Yes (appropriate for developmental age) (pt states he can preform ADL's)  Prior Inpatient Therapy Prior Inpatient Therapy: Yes Prior Therapy Dates: 2017 Prior Therapy Facilty/Provider(s): [facility] Reason for Treatment: SA issues, S/I  Prior Outpatient Therapy Prior Outpatient Therapy: No Prior Therapy Dates: na Prior Therapy Facilty/Provider(s): na Reason for Treatment: na Does patient have an ACCT team?: No Does patient have Intensive In-House Services?  : No Does patient have [facility] services? : No Does patient have P4CC services?: No  ADL Screening (condition at time of admission) Patient's cognitive ability adequate to safely complete daily activities?: Yes Is the patient deaf or have difficulty hearing?: No Does the patient have difficulty seeing, even when wearing glasses/contacts?: No Does  the patient have difficulty concentrating, remembering, or making decisions?: Yes Patient able to express need for assistance with ADLs?: Yes Does the patient have difficulty dressing or bathing?: No Independently performs ADLs?: Yes (appropriate for developmental age) (pt states he can preform ADL's) Does the patient have difficulty walking or climbing stairs?: Yes (pt stated he just gets "stiff") Weakness of Legs: None Weakness of Arms/Hands: None  Home Assistive Devices/Equipment Home Assistive Devices/Equipment:  (prior hx idicates pt uses a cane but pt denies)  Therapy Consults (therapy consults require a physician order) PT Evaluation Needed: No OT Evalulation Needed: No SLP Evaluation Needed: No Abuse/Neglect Assessment (Assessment to be complete while patient is alone) Physical  Abuse: Denies Verbal Abuse: Denies Sexual Abuse: Denies Exploitation of patient/patient's resources: Denies Self-Neglect: Denies Values / Beliefs Cultural Requests During Hospitalization: None Spiritual Requests During Hospitalization: None Consults Spiritual Care Consult Needed: No Social Work Consult Needed: No Regulatory affairs officer (For Healthcare) Does patient have an advance directive?: No Would patient like information on creating an advanced directive?: No - patient declined information    Additional Information 1:1 In Past 12 Months?: No CIRT Risk: No Elopement Risk: No Does patient have medical clearance?: Yes     Disposition: Case was staffed with Reita Cliche DNP who recommended patient be re-evaluated in the a.m.  Disposition Initial Assessment Completed for this Encounter: Yes Disposition of Patient: Inpatient treatment program Type of inpatient treatment program: Adult  On Site Evaluation by:   Reviewed with Physician:    Mamie Nick 12/26/2015 1:41 PM

## 2015-12-26 NOTE — BH Assessment (Signed)
[facility] Assessment Progress Note   Case was staffed with Lord DNP who recommended patient be re-evaluated in the a.m.    

## 2015-12-26 NOTE — ED Notes (Signed)
Nursing Progress Note: 7p-7a D: Pt currently presents with a sad affect and uncooperative/depressed behavior. Pt reports to writer that their goal is to "just die. My time here is done" Pt states "i'll stay here, but I ain't doing anything." Pt reports "ok" sleep with current medication regimen.   A: Pt provided with medications per providers orders. Pt's labs and vitals were monitored throughout the night. Pt supported emotionally and encouraged to express concerns and questions. Pt educated on medications.  R: Pt's safety ensured with 15 minute and environmental checks. Pt currently HI/Self Harm and A/V hallucinations. Pt verbally agrees to seek staff if SI/HI or A/VH occurs and to consult with staff before acting on any harmful thoughts. Will continue POC.

## 2015-12-26 NOTE — ED Notes (Signed)
Bed: WA29 Expected date:  Expected time:  Means of arrival:  Comments: Room 4 

## 2015-12-27 DIAGNOSIS — F332 Major depressive disorder, recurrent severe without psychotic features: Secondary | ICD-10-CM

## 2015-12-27 DIAGNOSIS — Z833 Family history of diabetes mellitus: Secondary | ICD-10-CM | POA: Diagnosis not present

## 2015-12-27 DIAGNOSIS — F1414 Cocaine abuse with cocaine-induced mood disorder: Secondary | ICD-10-CM

## 2015-12-27 DIAGNOSIS — Z79899 Other long term (current) drug therapy: Secondary | ICD-10-CM

## 2015-12-27 DIAGNOSIS — Z8249 Family history of ischemic heart disease and other diseases of the circulatory system: Secondary | ICD-10-CM

## 2015-12-27 DIAGNOSIS — Z888 Allergy status to other drugs, medicaments and biological substances status: Secondary | ICD-10-CM

## 2015-12-27 DIAGNOSIS — R45851 Suicidal ideations: Secondary | ICD-10-CM

## 2015-12-27 MED ORDER — LEVETIRACETAM 500 MG PO TABS
500.0000 mg | ORAL_TABLET | Freq: Two times a day (BID) | ORAL | Status: DC
  Administered 2015-12-28 – 2015-12-29 (×2): 500 mg via ORAL
  Filled 2015-12-27 (×3): qty 1

## 2015-12-27 MED ORDER — TRAZODONE HCL 50 MG PO TABS: 50.0000 mg | ORAL_TABLET | Freq: Every evening | ORAL | Status: DC | PRN

## 2015-12-27 MED ORDER — SODIUM CHLORIDE 0.9 % IV BOLUS (SEPSIS): 1000.0000 mL | Freq: Once | INTRAVENOUS | Status: DC

## 2015-12-27 MED ORDER — TRAZODONE HCL 100 MG PO TABS
100.0000 mg | ORAL_TABLET | Freq: Every day | ORAL | Status: DC
  Administered 2015-12-28: 100 mg via ORAL
  Filled 2015-12-27: qty 1

## 2015-12-27 MED ORDER — METOPROLOL TARTRATE 25 MG PO TABS
12.5000 mg | ORAL_TABLET | Freq: Two times a day (BID) | ORAL | Status: DC
Start: 2015-12-27 — End: 2015-12-29
  Administered 2015-12-28 – 2015-12-29 (×2): 12.5 mg via ORAL
  Filled 2015-12-27 (×3): qty 1

## 2015-12-27 MED ORDER — METFORMIN HCL 500 MG PO TABS
500.0000 mg | ORAL_TABLET | Freq: Every day | ORAL | Status: DC
  Administered 2015-12-29: 500 mg via ORAL
  Filled 2015-12-27: qty 1

## 2015-12-27 MED ORDER — HYDROXYZINE HCL 25 MG PO TABS
25.0000 mg | ORAL_TABLET | Freq: Four times a day (QID) | ORAL | Status: DC
  Administered 2015-12-29 (×2): 25 mg via ORAL
  Filled 2015-12-27 (×3): qty 1

## 2015-12-27 MED ORDER — ALBUTEROL SULFATE HFA 108 (90 BASE) MCG/ACT IN AERS: 2.0000 | INHALATION_SPRAY | Freq: Four times a day (QID) | RESPIRATORY_TRACT | Status: DC | PRN

## 2015-12-27 MED ORDER — ALBUTEROL SULFATE (2.5 MG/3ML) 0.083% IN NEBU: 2.5000 mg | INHALATION_SOLUTION | Freq: Four times a day (QID) | RESPIRATORY_TRACT | Status: DC | PRN

## 2015-12-27 NOTE — ED Notes (Signed)
Up to the bathroom, ambulatory w/o difficulty, pt aware that he needs to collect a urine sample
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
## 2015-12-27 NOTE — ED Notes (Signed)
Pt is not being dc'd pt to be admitted per Dr A.

## 2015-12-27 NOTE — ED Notes (Addendum)
Kristine RN from [facility] pt was noncompliant with bolus. BP retaken results were 115/59. Dr was notified by Minette Headland RN and gave consent to bring pt back to [facility]. Pt back at [facility]. Pt's safety ensured with 15 minute and environmental checks.

## 2015-12-27 NOTE — ED Notes (Addendum)
Dalton [name] (Dr. Leonette [facility]) informed on the pts hypotensive state and med, food, and drink noncompliance. Dalton [name] decided to transport pt to TCU. Report given to [facility]. Transported to TCU without incident.

## 2015-12-27 NOTE — ED Notes (Signed)
Resting quielty, continues to decline po's.  Pt continues to contract for safety but states that "i'm just going to let my body shut down."

## 2015-12-27 NOTE — Progress Notes (Signed)
Disposition CSW completed the following patient referrals to the following inpatient psych facilities:  [facility] will continue to follow patient for placement needs.  [facility] Disposition CSW [phone]

## 2015-12-27 NOTE — ED Notes (Signed)
Patient noted sleeping in room. No complaints, stable, in no acute distress. Q15 minute rounds and monitoring via [employer] Cam[employer] to continue.  

## 2015-12-27 NOTE — ED Notes (Signed)
Pt resting quietly, declined medications, food or drink "I don't want nothing...something to get this over with faster...just want to die..."  Support given.

## 2015-12-27 NOTE — BH Assessment (Signed)
[facility] Assessment Progress Note    12/27/15: Patient continues to meet inpatient criteria as appropriate bed placement is investigated.

## 2015-12-27 NOTE — ED Notes (Signed)
Report to include situation, background, assessment and recommendations from [place]. Patient sleeping, respirations regular and unlabored. Q15 minute rounds and security camera observation to continue.

## 2015-12-27 NOTE — ED Notes (Signed)
Bed: [facility] Expected date:  Expected time:  Means of arrival:  Comments: Hold for 30

## 2015-12-27 NOTE — ED Notes (Signed)
  Pt alert x4 resting TX from SAPU to TCU for low BP's  92/49, 63. Pt has been refusing all med's, and po meals. Order given to start 1000 cc NS bolus, the pt has refused all meds at this time MD Cardama made aware. The Pt's BP is 115/ 59, 76 97% on R/A, MD ok with holding off the bolus transfusion for now  will try to encourage PO fluids and will TX back to SAPU. I will continue to monitor.

## 2015-12-27 NOTE — ED Notes (Signed)
Per mHt pt asked him for a knife.  Dr A is aware of pt's continued suicidal comments and request to [facility].

## 2015-12-27 NOTE — ED Notes (Addendum)
Pt sitting on the bed, flat affect.  Pt again sts that he "don't want nothing, just want to die."  Pt reports that he has been admitted before and that people tell him they are going to help, but when he is discharged that he goes back to the same area and living situation.  Pt reports that his son "doesn't want anything to do with me.. can't see my grandchild..."  Pt states that this was the "last straw.[name] KitchenMarland KitchenI'm done."  Pt sts that he is "not going to eat, take medications, just let my body shut down and die...nothing will change..." Support given.

## 2015-12-27 NOTE — ED Notes (Signed)
Pt declined to eat supper

## 2015-12-28 DIAGNOSIS — F1414 Cocaine abuse with cocaine-induced mood disorder: Secondary | ICD-10-CM | POA: Diagnosis not present

## 2015-12-28 MED ORDER — ACETAMINOPHEN 500 MG PO TABS: 500.0000 mg | ORAL_TABLET | Freq: Two times a day (BID) | ORAL | Status: DC | PRN

## 2015-12-28 MED ORDER — ACETAMINOPHEN 500 MG PO TABS
1000.0000 mg | ORAL_TABLET | Freq: Two times a day (BID) | ORAL | Status: DC | PRN
  Administered 2015-12-28 (×2): 1000 mg via ORAL
  Filled 2015-12-28 (×2): qty 2

## 2015-12-28 NOTE — ED Notes (Signed)
Patient noted in room. No complaints, stable, in no acute distress. Q15 minute rounds and monitoring via Security Cameras to continue.  

## 2015-12-28 NOTE — ED Notes (Signed)
Ate 95% of supper

## 2015-12-28 NOTE — BHH Counselor (Signed)
Writer woke up pt for reassessment. Pt is oriented x 4. He reports SI "everyday until it is done". Pt says he has had "several" suicide attempts. He reports he was "up and down" a lot during the night. He reports poor appetite and sts he hasn't had anything to eat since presenting to [facility]. He denies HI and denies [facility]. When writer asks whether anyone is out to get pt, pt sts, "The whole world is out to get me." He explains that other people are determining whether he is d/c or not.   Arnold Long, [place] [facility]rapeutic Triage Specialist

## 2015-12-28 NOTE — ED Notes (Signed)
Report to include Situation, Background, Assessment, and Recommendations received from [place]. Patient alert and oriented, warm and dry, in no acute distress. Patient denies HI, AVH and pain. Patient still c/o SI with out plan. Patient made aware of Q15 minute rounds and security cameras for their safety. Patient instructed to come to me with needs or concerns.

## 2015-12-28 NOTE — ED Notes (Signed)
Patient noted sleeping in room. No complaints, stable, in no acute distress. Q15 minute rounds and monitoring via [employer] Cam[employer] to continue.  

## 2015-12-28 NOTE — ED Notes (Signed)
PO fluids encouraged.

## 2015-12-28 NOTE — ED Notes (Signed)
Watching tv sitting in chair.  PO fluids encouraged, pt given ice water

## 2015-12-28 NOTE — ED Notes (Signed)
Pt ate 100% of lunch

## 2015-12-28 NOTE — ED Notes (Signed)
Pt sitting quietly on the bed initially declined to allow BP to be taken, but then agreed.  Pt also agreed to drink Gatorade.

## 2015-12-28 NOTE — ED Notes (Signed)
On the phone 

## 2015-12-28 NOTE — ED Notes (Signed)
Patient noted in room. Stable, in no acute distress. Q15 minute rounds and monitoring via [employer] to continue. Snack and beverage given.

## 2015-12-29 ENCOUNTER — Observation Stay (HOSPITAL_COMMUNITY)
Admission: AD | Admit: 2015-12-29 | Discharge: 2015-12-30 | Disposition: A | Discharge status: Home / Self Care | Payer: Medicare Other | Source: Intra-hospital | Attending: Psychiatry | Admitting: Psychiatry

## 2015-12-29 ENCOUNTER — Encounter (HOSPITAL_COMMUNITY): Payer: Self-pay | Admitting: *Deleted

## 2015-12-29 DIAGNOSIS — I2510 Atherosclerotic heart disease of native coronary artery without angina pectoris: Secondary | ICD-10-CM | POA: Insufficient documentation

## 2015-12-29 DIAGNOSIS — Z590 Homelessness: Secondary | ICD-10-CM | POA: Diagnosis not present

## 2015-12-29 DIAGNOSIS — I252 Old myocardial infarction: Secondary | ICD-10-CM | POA: Insufficient documentation

## 2015-12-29 DIAGNOSIS — I110 Hypertensive heart disease with heart failure: Secondary | ICD-10-CM | POA: Insufficient documentation

## 2015-12-29 DIAGNOSIS — R45851 Suicidal ideations: Secondary | ICD-10-CM | POA: Insufficient documentation

## 2015-12-29 DIAGNOSIS — K219 Gastro-esophageal reflux disease without esophagitis: Secondary | ICD-10-CM | POA: Insufficient documentation

## 2015-12-29 DIAGNOSIS — F1414 Cocaine abuse with cocaine-induced mood disorder: Secondary | ICD-10-CM | POA: Diagnosis not present

## 2015-12-29 DIAGNOSIS — Z8249 Family history of ischemic heart disease and other diseases of the circulatory system: Secondary | ICD-10-CM | POA: Diagnosis not present

## 2015-12-29 DIAGNOSIS — G40409 Other generalized epilepsy and epileptic syndromes, not intractable, without status epilepticus: Secondary | ICD-10-CM | POA: Diagnosis not present

## 2015-12-29 DIAGNOSIS — E7800 Pure hypercholesterolemia, unspecified: Secondary | ICD-10-CM | POA: Diagnosis not present

## 2015-12-29 DIAGNOSIS — F332 Major depressive disorder, recurrent severe without psychotic features: Secondary | ICD-10-CM | POA: Diagnosis not present

## 2015-12-29 DIAGNOSIS — E1151 Type 2 diabetes mellitus with diabetic peripheral angiopathy without gangrene: Secondary | ICD-10-CM | POA: Diagnosis not present

## 2015-12-29 DIAGNOSIS — Z951 Presence of aortocoronary bypass graft: Secondary | ICD-10-CM | POA: Diagnosis not present

## 2015-12-29 DIAGNOSIS — M069 Rheumatoid arthritis, unspecified: Secondary | ICD-10-CM | POA: Insufficient documentation

## 2015-12-29 DIAGNOSIS — I509 Heart failure, unspecified: Secondary | ICD-10-CM | POA: Diagnosis not present

## 2015-12-29 DIAGNOSIS — Z79899 Other long term (current) drug therapy: Secondary | ICD-10-CM | POA: Diagnosis not present

## 2015-12-29 DIAGNOSIS — Z833 Family history of diabetes mellitus: Secondary | ICD-10-CM | POA: Diagnosis not present

## 2015-12-29 MED ORDER — CITALOPRAM HYDROBROMIDE 10 MG PO TABS
10.0000 mg | ORAL_TABLET | Freq: Every day | ORAL | Status: DC
  Administered 2015-12-30: 10 mg via ORAL
  Filled 2015-12-29: qty 1

## 2015-12-29 MED ORDER — METOPROLOL TARTRATE 25 MG PO TABS
12.5000 mg | ORAL_TABLET | Freq: Two times a day (BID) | ORAL | Status: DC
  Administered 2015-12-29 – 2015-12-30 (×2): 12.5 mg via ORAL
  Filled 2015-12-29 (×2): qty 1

## 2015-12-29 MED ORDER — CITALOPRAM HYDROBROMIDE 10 MG PO TABS
10.0000 mg | ORAL_TABLET | Freq: Every day | ORAL | Status: DC
  Administered 2015-12-29: 10 mg via ORAL
  Filled 2015-12-29: qty 1

## 2015-12-29 MED ORDER — TRAZODONE HCL 100 MG PO TABS: 100.0000 mg | ORAL_TABLET | Freq: Every day | ORAL | Status: DC

## 2015-12-29 MED ORDER — ALBUTEROL SULFATE HFA 108 (90 BASE) MCG/ACT IN AERS: 2.0000 | INHALATION_SPRAY | Freq: Four times a day (QID) | RESPIRATORY_TRACT | Status: DC | PRN

## 2015-12-29 MED ORDER — POTASSIUM CHLORIDE CRYS ER 10 MEQ PO TBCR
10.0000 meq | EXTENDED_RELEASE_TABLET | Freq: Every day | ORAL | Status: DC
  Administered 2015-12-29: 10 meq via ORAL
  Filled 2015-12-29: qty 1

## 2015-12-29 MED ORDER — CLOPIDOGREL BISULFATE 75 MG PO TABS
75.0000 mg | ORAL_TABLET | Freq: Every day | ORAL | Status: DC
  Administered 2015-12-29: 75 mg via ORAL
  Filled 2015-12-29: qty 1

## 2015-12-29 MED ORDER — RISPERIDONE 1 MG PO TBDP
1.0000 mg | ORAL_TABLET | Freq: Two times a day (BID) | ORAL | Status: DC
  Administered 2015-12-29: 1 mg via ORAL
  Filled 2015-12-29 (×2): qty 1

## 2015-12-29 MED ORDER — POTASSIUM CHLORIDE CRYS ER 10 MEQ PO TBCR
10.0000 meq | EXTENDED_RELEASE_TABLET | Freq: Every day | ORAL | Status: DC
  Administered 2015-12-30: 10 meq via ORAL
  Filled 2015-12-29 (×3): qty 1

## 2015-12-29 MED ORDER — ALUM & MAG HYDROXIDE-SIMETH 200-200-20 MG/5ML PO SUSP: 30.0000 mL | ORAL | Status: DC | PRN

## 2015-12-29 MED ORDER — HYDROXYZINE HCL 25 MG PO TABS
25.0000 mg | ORAL_TABLET | Freq: Four times a day (QID) | ORAL | Status: DC
  Administered 2015-12-29 – 2015-12-30 (×2): 25 mg via ORAL
  Filled 2015-12-29 (×2): qty 1

## 2015-12-29 MED ORDER — CLOPIDOGREL BISULFATE 75 MG PO TABS
75.0000 mg | ORAL_TABLET | Freq: Every day | ORAL | Status: DC
  Administered 2015-12-30: 75 mg via ORAL
  Filled 2015-12-29: qty 1

## 2015-12-29 MED ORDER — ALBUTEROL SULFATE (2.5 MG/3ML) 0.083% IN NEBU: 2.5000 mg | INHALATION_SOLUTION | Freq: Four times a day (QID) | RESPIRATORY_TRACT | Status: DC | PRN

## 2015-12-29 MED ORDER — HYDROCHLOROTHIAZIDE 25 MG PO TABS
25.0000 mg | ORAL_TABLET | Freq: Every day | ORAL | Status: DC
  Administered 2015-12-30: 25 mg via ORAL
  Filled 2015-12-29: qty 1

## 2015-12-29 MED ORDER — METFORMIN HCL 500 MG PO TABS
500.0000 mg | ORAL_TABLET | Freq: Every day | ORAL | Status: DC
  Administered 2015-12-30: 500 mg via ORAL
  Filled 2015-12-29: qty 1

## 2015-12-29 MED ORDER — MAGNESIUM HYDROXIDE 400 MG/5ML PO SUSP: 30.0000 mL | Freq: Every day | ORAL | Status: DC | PRN

## 2015-12-29 MED ORDER — ACETAMINOPHEN 325 MG PO TABS
650.0000 mg | ORAL_TABLET | Freq: Four times a day (QID) | ORAL | Status: DC | PRN
  Administered 2015-12-29: 650 mg via ORAL
  Filled 2015-12-29: qty 2

## 2015-12-29 MED ORDER — ACETAMINOPHEN 500 MG PO TABS
1000.0000 mg | ORAL_TABLET | Freq: Two times a day (BID) | ORAL | Status: DC | PRN
Start: 2015-12-29 — End: 2015-12-29

## 2015-12-29 MED ORDER — LEVETIRACETAM 500 MG PO TABS
500.0000 mg | ORAL_TABLET | Freq: Two times a day (BID) | ORAL | Status: DC
  Administered 2015-12-29 – 2015-12-30 (×2): 500 mg via ORAL
  Filled 2015-12-29 (×2): qty 1

## 2015-12-29 MED ORDER — RISPERIDONE 1 MG PO TBDP
1.0000 mg | ORAL_TABLET | Freq: Two times a day (BID) | ORAL | Status: DC
  Administered 2015-12-29 – 2015-12-30 (×2): 1 mg via ORAL
  Filled 2015-12-29 (×2): qty 1

## 2015-12-29 MED ORDER — HYDROCHLOROTHIAZIDE 25 MG PO TABS
25.0000 mg | ORAL_TABLET | Freq: Every day | ORAL | Status: DC
  Administered 2015-12-29: 25 mg via ORAL
  Filled 2015-12-29: qty 1

## 2015-12-29 NOTE — ED Notes (Signed)
Patient noted sleeping in room. No complaints, stable, in no acute distress. Q15 minute rounds and monitoring via [employer] Cam[employer] to continue.  

## 2015-12-29 NOTE — BH Assessment (Signed)
[facility] Assessment Progress Note   12/29/15: Patient continues to meet inpatient criteria as appropriate bed placement is investigated.

## 2015-12-29 NOTE — Progress Notes (Signed)
12/29/15 1339:  LRT went to pt room, pt was sleep.  Victorino Sparrow, LRT/CTRS

## 2015-12-29 NOTE — Progress Notes (Signed)
Pt in bed 2 at shift change.  Pt verbalizes no complains or pain or discomfort.  Pt favoring his L jaw d/t surgery previous week.  Pt sleep and slow to respond to questions.  Pt does contract for safety.Pt is cooperative and compliant. Pt receives medication and returns to sleep Pt continuously observed for safety except when in the bathroom. Pt remains safe.

## 2015-12-29 NOTE — Progress Notes (Signed)
Admission DAR Note: Pt is a 55 y/o AAM presented voluntarily to [facility] Observation unit for continuation of care related to SI. Pt presents with depressed mood and affect. A & O X4. Endorsed SI, states "I just want to die" refused to voice any plan when asked by writer; stated "What's the purpose then, I can't really do anything in here" however, he verbally contracts for safety. Per nursing report, pt does have a h/o of MDD, DM II and cocaine induced mood disorder. Pt reportedly walked into traffic after using cocaine.  Pt states he used $80 worth of cocaine in the last couple of days. He denies etoh use and smoking when assessed. Pt was recently d/c'd from [facility] hall and stated he had surgery done on his left jaw last Friday and jaw appears swollen. Skin assessment done, multiple old scars noted on sternum, right hand, mid lower back. Belonging search done and items secured in locker. Unit routines discussed with pt, who verbalized understanding. Supper tray and fluids offered to pt and was tolerated well. Support and availability provided. Writer encouraged pt to voice concerns and comply with current treatment regimen. Safety maintained in unit without self harm gestures or outburst to note at this time. Will continue to monitor pt for safety and mood stability.

## 2015-12-29 NOTE — ED Notes (Signed)
Pt discharged ambulatory with [place] driver.  All belongings were sent with pt.  Pt was calm and cooperative.
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
## 2015-12-29 NOTE — BH Assessment (Signed)
[facility] Assessment Progress Note  Per Corena Pilgrim, MD, this pt would benefit from admission to the [facility] Observation Unit at this time.  Ria Comment, RN, [facility] has assigned pt to Obs 1; they will be ready to receive pt at 16:00.  Pt presents under IVC, which Dr Darleene Cleaver has rescinded.  Pt has signed Voluntary Admission and Consent for Treatment, as well as Consent to Release Information to no one, and signed forms have been faxed to [facility].  Pt's nurse, Nena Jordan, has been notified, and agrees to send original paperwork along with pt via [place] [place], and to call report to [phone] or [phone].  Jalene Mullet, [place] Triage Specialist [phone]

## 2015-12-29 NOTE — Progress Notes (Signed)
This Probation officer spoke to the patient upon his arrival about his reason for admission to [facility]. Patient expressed thoughts of committing suicide. Patient stated he needs assistance with finding reasons to live.   5:55 PM 12/29/2015 Ashley Royalty

## 2015-12-29 NOTE — ED Notes (Signed)
This patient has a very flat affect.  He continues to state that he wants to die.  He cannot contract for safety.  He is compliant with his medication.  Denies H/I and AVH.  15 minute checks and video monitoring continue.

## 2015-12-30 ENCOUNTER — Encounter (HOSPITAL_COMMUNITY): Payer: Self-pay | Admitting: *Deleted

## 2015-12-30 ENCOUNTER — Emergency Department (HOSPITAL_COMMUNITY)
Admission: EM | Admit: 2015-12-30 | Discharge: 2015-12-31 | Disposition: A | Discharge status: Other Acute Inpatient Hospital | Payer: Medicare Other | Attending: Emergency Medicine | Admitting: Emergency Medicine

## 2015-12-30 DIAGNOSIS — I509 Heart failure, unspecified: Secondary | ICD-10-CM | POA: Diagnosis not present

## 2015-12-30 DIAGNOSIS — F319 Bipolar disorder, unspecified: Secondary | ICD-10-CM | POA: Diagnosis not present

## 2015-12-30 DIAGNOSIS — Z79899 Other long term (current) drug therapy: Secondary | ICD-10-CM | POA: Diagnosis not present

## 2015-12-30 DIAGNOSIS — I252 Old myocardial infarction: Secondary | ICD-10-CM | POA: Insufficient documentation

## 2015-12-30 DIAGNOSIS — F1414 Cocaine abuse with cocaine-induced mood disorder: Secondary | ICD-10-CM | POA: Diagnosis not present

## 2015-12-30 DIAGNOSIS — Z7984 Long term (current) use of oral hypoglycemic drugs: Secondary | ICD-10-CM | POA: Insufficient documentation

## 2015-12-30 DIAGNOSIS — R45851 Suicidal ideations: Secondary | ICD-10-CM

## 2015-12-30 DIAGNOSIS — Z951 Presence of aortocoronary bypass graft: Secondary | ICD-10-CM | POA: Insufficient documentation

## 2015-12-30 DIAGNOSIS — E1151 Type 2 diabetes mellitus with diabetic peripheral angiopathy without gangrene: Secondary | ICD-10-CM | POA: Insufficient documentation

## 2015-12-30 DIAGNOSIS — F1410 Cocaine abuse, uncomplicated: Secondary | ICD-10-CM | POA: Diagnosis not present

## 2015-12-30 DIAGNOSIS — I2510 Atherosclerotic heart disease of native coronary artery without angina pectoris: Secondary | ICD-10-CM | POA: Insufficient documentation

## 2015-12-30 DIAGNOSIS — I110 Hypertensive heart disease with heart failure: Secondary | ICD-10-CM | POA: Diagnosis not present

## 2015-12-30 LAB — COMPREHENSIVE METABOLIC PANEL
ALT: 15 U/L — ABNORMAL LOW (ref 17–63)
ANION GAP: 9 (ref 5–15)
AST: 19 U/L (ref 15–41)
Albumin: 4.3 g/dL (ref 3.5–5.0)
Alkaline Phosphatase: 77 U/L (ref 38–126)
BUN: 11 mg/dL (ref 6–20)
CHLORIDE: 103 mmol/L (ref 101–111)
CO2: 25 mmol/L (ref 22–32)
Calcium: 9.4 mg/dL (ref 8.9–10.3)
Creatinine, Ser: 0.96 mg/dL (ref 0.61–1.24)
Glucose, Bld: 109 mg/dL — ABNORMAL HIGH (ref 65–99)
POTASSIUM: 3.6 mmol/L (ref 3.5–5.1)
Sodium: 137 mmol/L (ref 135–145)
Total Bilirubin: 0.8 mg/dL (ref 0.3–1.2)
Total Protein: 8.4 g/dL — ABNORMAL HIGH (ref 6.5–8.1)

## 2015-12-30 LAB — CBC
HCT: 36.9 % — ABNORMAL LOW (ref 39.0–52.0)
Hemoglobin: 13.2 g/dL (ref 13.0–17.0)
MCH: 29 pg (ref 26.0–34.0)
MCHC: 35.8 g/dL (ref 30.0–36.0)
MCV: 81.1 fL (ref 78.0–100.0)
PLATELETS: 322 10*3/uL (ref 150–400)
RBC: 4.55 MIL/uL (ref 4.22–5.81)
RDW: 13.2 % (ref 11.5–15.5)
WBC: 4.9 10*3/uL (ref 4.0–10.5)

## 2015-12-30 LAB — RAPID URINE DRUG SCREEN, HOSP PERFORMED
AMPHETAMINES: NOT DETECTED
BENZODIAZEPINES: NOT DETECTED
Barbiturates: NOT DETECTED
COCAINE: POSITIVE — AB
OPIATES: NOT DETECTED
Tetrahydrocannabinol: NOT DETECTED

## 2015-12-30 LAB — ACETAMINOPHEN LEVEL

## 2015-12-30 LAB — ETHANOL

## 2015-12-30 LAB — SALICYLATE LEVEL

## 2015-12-30 MED ORDER — CETIRIZINE HCL 10 MG PO TABS: 10.0000 mg | ORAL_TABLET | Freq: Every day | ORAL | Status: DC

## 2015-12-30 MED ORDER — METOPROLOL TARTRATE 25 MG PO TABS: 12.5000 mg | ORAL_TABLET | Freq: Two times a day (BID) | ORAL | 0 refills | Status: DC

## 2015-12-30 MED ORDER — CITALOPRAM HYDROBROMIDE 10 MG PO TABS: 10.0000 mg | ORAL_TABLET | Freq: Every day | ORAL | Status: DC

## 2015-12-30 MED ORDER — CLINDAMYCIN HCL 300 MG PO CAPS: 300.0000 mg | ORAL_CAPSULE | Freq: Three times a day (TID) | ORAL | 0 refills | Status: DC

## 2015-12-30 MED ORDER — ATORVASTATIN CALCIUM 10 MG PO TABS
10.0000 mg | ORAL_TABLET | Freq: Every morning | ORAL | Status: DC
  Filled 2015-12-30: qty 1

## 2015-12-30 MED ORDER — ALBUTEROL SULFATE HFA 108 (90 BASE) MCG/ACT IN AERS: 2.0000 | INHALATION_SPRAY | Freq: Four times a day (QID) | RESPIRATORY_TRACT | Status: DC | PRN

## 2015-12-30 MED ORDER — CLOPIDOGREL BISULFATE 75 MG PO TABS
75.0000 mg | ORAL_TABLET | Freq: Every day | ORAL | Status: DC
  Filled 2015-12-30: qty 1

## 2015-12-30 MED ORDER — RISPERIDONE 1 MG PO TBDP
1.0000 mg | ORAL_TABLET | Freq: Two times a day (BID) | ORAL | Status: DC
  Administered 2015-12-30: 1 mg via ORAL
  Filled 2015-12-30 (×2): qty 1

## 2015-12-30 MED ORDER — FLUTICASONE PROPIONATE HFA 44 MCG/ACT IN AERO: 2.0000 | INHALATION_SPRAY | Freq: Two times a day (BID) | RESPIRATORY_TRACT | 12 refills | Status: DC

## 2015-12-30 MED ORDER — HYDROCHLOROTHIAZIDE 25 MG PO TABS
25.0000 mg | ORAL_TABLET | Freq: Every day | ORAL | Status: DC
  Filled 2015-12-30: qty 1

## 2015-12-30 MED ORDER — HYDROXYZINE HCL 25 MG PO TABS
25.0000 mg | ORAL_TABLET | Freq: Four times a day (QID) | ORAL | Status: DC
  Administered 2015-12-30 – 2015-12-31 (×2): 25 mg via ORAL
  Filled 2015-12-30 (×2): qty 1

## 2015-12-30 MED ORDER — METOPROLOL TARTRATE 25 MG PO TABS
12.5000 mg | ORAL_TABLET | Freq: Two times a day (BID) | ORAL | Status: DC
  Administered 2015-12-30: 12.5 mg via ORAL
  Filled 2015-12-30: qty 1

## 2015-12-30 MED ORDER — METFORMIN HCL 500 MG PO TABS: 500.0000 mg | ORAL_TABLET | Freq: Every day | ORAL | Status: DC

## 2015-12-30 MED ORDER — TRAZODONE HCL 50 MG PO TABS: 50.0000 mg | ORAL_TABLET | Freq: Every evening | ORAL | Status: DC | PRN

## 2015-12-30 MED ORDER — ACETAMINOPHEN 325 MG PO TABS
650.0000 mg | ORAL_TABLET | Freq: Four times a day (QID) | ORAL | Status: DC | PRN
  Administered 2015-12-30: 650 mg via ORAL
  Filled 2015-12-30: qty 2

## 2015-12-30 MED ORDER — BECLOMETHASONE DIPROPIONATE 80 MCG/ACT IN AERS: 2.0000 | INHALATION_SPRAY | Freq: Two times a day (BID) | RESPIRATORY_TRACT | 12 refills | Status: DC

## 2015-12-30 MED ORDER — LEVETIRACETAM 500 MG PO TABS: 500.0000 mg | ORAL_TABLET | Freq: Two times a day (BID) | ORAL | 0 refills | Status: DC

## 2015-12-30 MED ORDER — HYDROCHLOROTHIAZIDE 25 MG PO TABS: 25.0000 mg | ORAL_TABLET | Freq: Every day | ORAL | 0 refills | Status: DC

## 2015-12-30 MED ORDER — BECLOMETHASONE DIPROPIONATE 80 MCG/ACT IN AERS
2.0000 | INHALATION_SPRAY | Freq: Two times a day (BID) | RESPIRATORY_TRACT | Status: DC
  Filled 2015-12-30: qty 8.7

## 2015-12-30 MED ORDER — IPRATROPIUM BROMIDE 0.02 % IN SOLN: 0.5000 mg | Freq: Four times a day (QID) | RESPIRATORY_TRACT | Status: DC | PRN

## 2015-12-30 MED ORDER — CLOPIDOGREL BISULFATE 75 MG PO TABS: 75.0000 mg | ORAL_TABLET | Freq: Every day | ORAL | Status: DC

## 2015-12-30 MED ORDER — ESOMEPRAZOLE MAGNESIUM 40 MG PO CPDR: 40.0000 mg | DELAYED_RELEASE_CAPSULE | Freq: Two times a day (BID) | ORAL | Status: DC

## 2015-12-30 MED ORDER — FLUTICASONE PROPIONATE HFA 44 MCG/ACT IN AERO
2.0000 | INHALATION_SPRAY | Freq: Two times a day (BID) | RESPIRATORY_TRACT | Status: DC
  Administered 2015-12-30: 2 via RESPIRATORY_TRACT
  Filled 2015-12-30 (×5): qty 10.6

## 2015-12-30 MED ORDER — RISPERIDONE 1 MG PO TBDP: 1.0000 mg | ORAL_TABLET | Freq: Two times a day (BID) | ORAL | 0 refills | Status: DC

## 2015-12-30 MED ORDER — TAMSULOSIN HCL 0.4 MG PO CAPS: 0.4000 mg | ORAL_CAPSULE | Freq: Every day | ORAL | Status: DC

## 2015-12-30 MED ORDER — FUROSEMIDE 40 MG PO TABS
40.0000 mg | ORAL_TABLET | Freq: Every day | ORAL | Status: DC | PRN
  Filled 2015-12-30: qty 1

## 2015-12-30 MED ORDER — CITALOPRAM HYDROBROMIDE 10 MG PO TABS: 10.0000 mg | ORAL_TABLET | Freq: Every day | ORAL | 0 refills | Status: DC

## 2015-12-30 MED ORDER — METFORMIN HCL 500 MG PO TABS: 500.0000 mg | ORAL_TABLET | Freq: Every day | ORAL | 0 refills | Status: DC

## 2015-12-30 MED ORDER — ATORVASTATIN CALCIUM 10 MG PO TABS: 10.0000 mg | ORAL_TABLET | Freq: Every morning | ORAL | 0 refills | Status: DC

## 2015-12-30 MED ORDER — LEVETIRACETAM 500 MG PO TABS
500.0000 mg | ORAL_TABLET | Freq: Two times a day (BID) | ORAL | Status: DC
  Administered 2015-12-30: 500 mg via ORAL
  Filled 2015-12-30: qty 1

## 2015-12-30 MED ORDER — POTASSIUM CHLORIDE CRYS ER 10 MEQ PO TBCR: 10.0000 meq | EXTENDED_RELEASE_TABLET | Freq: Every day | ORAL | Status: DC

## 2015-12-30 NOTE — Progress Notes (Addendum)
Attempted to give patient AVS, discharge instructions, prescriptions and follow up appointment information but patient refused all of the above. Patient insistent upon leaving as soon as possible.  Suicide Prevention information/materials given to patient  All patient belongings returned to patient at time of discharge. Discharged  in stable condition. Ambulatory upon discharge.  Bus pass given  Prescriptions placed in shred box, witnessed by Tiffany MHT.

## 2015-12-30 NOTE — Progress Notes (Signed)
Pt repeatedly stated to observation staff, " I did not want to come here get me out of here". Pt stated to this writer "there is nothing you can do for me or anyone else here, I just want to leave". Pt continued to be very combative and angry with all satf in observation unit. Pt became agitated anytime staff tried to attempt at talking with him or calming him down. This Probation officer gave pt time to calm down due to concern for safety.

## 2015-12-30 NOTE — Progress Notes (Signed)
[place] OBSERVATION UNIT:  Family/Significant Other Suicide Prevention Education  Suicide Prevention Education:  Patient Refusal for Family/Significant Other Suicide Prevention Education: The patient Dalton [name] has refused to provide written consent for family/significant other to be provided Family/Significant Other Suicide Prevention Education during admission and/or prior to discharge.  Physician notified.  Harland German 12/30/2015, 9:41 AM   Harland German, RN 12/30/15  9:41 AM

## 2015-12-30 NOTE — Progress Notes (Signed)
Pt began to become combative with the NP. The NP offered resources as well as residential treatment facilities for the pt but the pt then became more combative and angry with the NP .  Pt became angry with NP and stated "I want to be sent home now"; "send me home now because yall cant do anything for me". NP offered pt shelters in the area and mentioned transportation options as well as assistance from observation unit staff. Pt remained combative and angry and stated "I just want to go, get me out of her".
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
## 2015-12-30 NOTE — ED Notes (Signed)
Patient stating he wants to leave and states that we aren't going to help him.  He does not want to go to [facility] again, stating, "They don't care."  Patient states he wants to go back out in the street and "take care of it."  Patient informed that he cannot leave and decision was made to request IVC since patient is asking to leave and has expressed SI.

## 2015-12-30 NOTE — Progress Notes (Signed)
This Probation officer offered pt resources for suicide treatment and prevention as well as substance abuse treatment facilities that she could help him find. This Probation officer also offered pt resources on programs that will assist him with housing and substance abuse needs as well. Pt refused all resources and stated to observation staff, "I want to go, get me out of here". Pt also stated; "where is the NP because I want to get out of here". Observation staff informed pt that the NP just left out of the room and we well talk to her. Pt stated "good because I did not want to come to this place or any hospital anyway, I just want to die". Pt continued to verbalize that he did not want to come to the hospital to get help. Pt stated " I want to go now so get me out of here now". Pt was very combative and angry. Pt became more angry with staff as well as engaging in negative talk with another pt about hospital staff.

## 2015-12-30 NOTE — ED Triage Notes (Addendum)
Per [employer] - patient was discharged from [facility] earlier today.  [employer] was called because he was sitting in the middle of [place] because he wanted to kill himself.  Patient states he does not believe he can stay off drugs. Patient was supposed to go participate in some outpatient therapy, but said he went there and it was "drug infested" and he left.  Patient states his last drug use was last Friday.

## 2015-12-30 NOTE — ED Notes (Signed)
Pt c/o of jaw pain (10/10) EDP notifed and reports that order will be put in.

## 2015-12-30 NOTE — ED Notes (Signed)
Bed: WA29 Expected date:  Expected time:  Means of arrival:  Comments: Hall B 
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
## 2015-12-30 NOTE — Progress Notes (Signed)
D:  Patient awake and alert; oriented x 4; he reports passive suicidal ideation; when asked if he had thoughts of harming himself he said "always". He denies homicidal ideation and AVH; no self-injurous behaviors noted or reported. A: Patient verbally contracts for safety;  Emotional support provided; encouraged him to seek assistance with needs/concerns. R:  Safety maintained on unit.

## 2015-12-30 NOTE — Discharge Summary (Signed)
[facility] Unit Discharge Summary  Admit date: 12/29/2015 Discharge date: 12/30/2015   Subjective:  Dalton Riedell Thompsonis a 55 y.o.malepatient came to the ED after walking in traffic after using cocaine.  HPI: Dalton [name] is a55 yo male who presented to the [facility] with suicidal ideations with cocaine use and walking in traffic. Today, he is clear but irritable and angry/demanding at times. Dalton [name] wants to leave to go finish "killing myself." He is not homicidal, no hallucinations or withdrawal symptoms. Dalton [name] is well known to this facility and providers. He has had multiple admissions for substance abuse with little to no follow-up on his part after discharge.   Today 12/30/2015, patient continues to demand services yet will often not participate in his care and definitely not out patient. He expresses a desire to go somewhere to for help with substance abuse, homeless with little motivation or investment in his care. Patient became agitated in the Observation Unit today during assessment after details from his recent discharge from [facility] was reviewed. According to recent discharge summary the patient was discharged from [facility] on 12/18/2015. He was scheduled to follow up with [facility] on 12/24/2015 but reports that he did not go because "I was sent to [facility] but it was a dump." Patient was unable to articulate what kind of help he was seeking stating "I'm tired of everyone saying it's the cocaine. I don't really use it that often anyway." When asked if he would like to be referred to another treatment center patient stated "Just let me go. I'm not getting any help here." Dalton [name] proceeded to start trying to remove his patient ID and became verbally aggressive demanding discharge. Patient provided with resources to address his situational stressors to include lack of stable housing and for substance abuse. Patient appears to be feigning mental health symptoms to obtain  inpatient admission. The patient was not forthcoming during assessment to explore reasons for feeling suicidal but instead became agitated. He was provided with resources for ADS and prescriptions for his mental health medications. Patient left [facility] in stable condition in no acute distress. The patient was provided with a bus pass to assist with transportation needs.

## 2015-12-30 NOTE — Progress Notes (Signed)
12/30/15 1349:  Pt was sleep when LRT made rounds.  Victorino Sparrow, LRT/CTRS
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
## 2015-12-30 NOTE — Discharge Planning (Signed)
[facility] Observation Unit Case Management Discharge Plan :  Will you be returning to the same living situation after discharge:  Yes  At discharge, do you have transportation home?: Yes,  [employer]  Do you have the ability to pay for your medications: Yes,  Patient has insurance  Release of information consent forms completed and in the chart;  Patient's signature needed at discharge.  Patient to Follow up at: Follow-up Information    [facility] Go in 1 day(s).   Specialty:  [facility] information: [place] [address] [phone]           Safety Planning and Suicide Prevention discussed: Yes,  Patient verbalized understanding   Harland German 12/30/2015, 9:56 AM

## 2015-12-31 ENCOUNTER — Inpatient Hospital Stay (HOSPITAL_COMMUNITY)
Admission: EM | Admit: 2015-12-31 | Discharge: 2016-01-07 | DRG: 885 | Disposition: A | Discharge status: Home / Self Care | Payer: Medicare Other | Source: Intra-hospital | Attending: Psychiatry | Admitting: Psychiatry

## 2015-12-31 ENCOUNTER — Encounter (HOSPITAL_COMMUNITY): Payer: Self-pay | Admitting: *Deleted

## 2015-12-31 DIAGNOSIS — Z8249 Family history of ischemic heart disease and other diseases of the circulatory system: Secondary | ICD-10-CM

## 2015-12-31 DIAGNOSIS — Z8711 Personal history of peptic ulcer disease: Secondary | ICD-10-CM | POA: Diagnosis not present

## 2015-12-31 DIAGNOSIS — E1151 Type 2 diabetes mellitus with diabetic peripheral angiopathy without gangrene: Secondary | ICD-10-CM | POA: Diagnosis present

## 2015-12-31 DIAGNOSIS — Z886 Allergy status to analgesic agent status: Secondary | ICD-10-CM | POA: Diagnosis not present

## 2015-12-31 DIAGNOSIS — F332 Major depressive disorder, recurrent severe without psychotic features: Secondary | ICD-10-CM | POA: Diagnosis present

## 2015-12-31 DIAGNOSIS — F314 Bipolar disorder, current episode depressed, severe, without psychotic features: Secondary | ICD-10-CM | POA: Diagnosis not present

## 2015-12-31 DIAGNOSIS — E7800 Pure hypercholesterolemia, unspecified: Secondary | ICD-10-CM | POA: Diagnosis present

## 2015-12-31 DIAGNOSIS — Z79899 Other long term (current) drug therapy: Secondary | ICD-10-CM | POA: Diagnosis not present

## 2015-12-31 DIAGNOSIS — Z86010 Personal history of colonic polyps: Secondary | ICD-10-CM | POA: Diagnosis not present

## 2015-12-31 DIAGNOSIS — Z833 Family history of diabetes mellitus: Secondary | ICD-10-CM

## 2015-12-31 DIAGNOSIS — K219 Gastro-esophageal reflux disease without esophagitis: Secondary | ICD-10-CM | POA: Diagnosis present

## 2015-12-31 DIAGNOSIS — I10 Essential (primary) hypertension: Secondary | ICD-10-CM | POA: Diagnosis present

## 2015-12-31 DIAGNOSIS — I2510 Atherosclerotic heart disease of native coronary artery without angina pectoris: Secondary | ICD-10-CM | POA: Diagnosis present

## 2015-12-31 DIAGNOSIS — G40409 Other generalized epilepsy and epileptic syndromes, not intractable, without status epilepticus: Secondary | ICD-10-CM | POA: Diagnosis present

## 2015-12-31 DIAGNOSIS — Z951 Presence of aortocoronary bypass graft: Secondary | ICD-10-CM

## 2015-12-31 DIAGNOSIS — I252 Old myocardial infarction: Secondary | ICD-10-CM | POA: Diagnosis not present

## 2015-12-31 DIAGNOSIS — Z888 Allergy status to other drugs, medicaments and biological substances status: Secondary | ICD-10-CM

## 2015-12-31 DIAGNOSIS — M069 Rheumatoid arthritis, unspecified: Secondary | ICD-10-CM | POA: Diagnosis present

## 2015-12-31 DIAGNOSIS — Z7951 Long term (current) use of inhaled steroids: Secondary | ICD-10-CM

## 2015-12-31 DIAGNOSIS — R45851 Suicidal ideations: Secondary | ICD-10-CM | POA: Diagnosis present

## 2015-12-31 DIAGNOSIS — F419 Anxiety disorder, unspecified: Secondary | ICD-10-CM | POA: Diagnosis present

## 2015-12-31 DIAGNOSIS — F1414 Cocaine abuse with cocaine-induced mood disorder: Secondary | ICD-10-CM | POA: Diagnosis present

## 2015-12-31 DIAGNOSIS — Z9889 Other specified postprocedural states: Secondary | ICD-10-CM

## 2015-12-31 DIAGNOSIS — F319 Bipolar disorder, unspecified: Secondary | ICD-10-CM | POA: Diagnosis not present

## 2015-12-31 DIAGNOSIS — Z7984 Long term (current) use of oral hypoglycemic drugs: Secondary | ICD-10-CM

## 2015-12-31 LAB — GLUCOSE, CAPILLARY: Glucose-Capillary: 121 mg/dL — ABNORMAL HIGH (ref 65–99)

## 2015-12-31 MED ORDER — HYDROCHLOROTHIAZIDE 25 MG PO TABS
25.0000 mg | ORAL_TABLET | Freq: Every day | ORAL | Status: DC
  Administered 2015-12-31 – 2016-01-07 (×7): 25 mg via ORAL
  Filled 2015-12-31 (×12): qty 1

## 2015-12-31 MED ORDER — BECLOMETHASONE DIPROPIONATE 80 MCG/ACT IN AERS
2.0000 | INHALATION_SPRAY | Freq: Two times a day (BID) | RESPIRATORY_TRACT | Status: DC
  Administered 2015-12-31 – 2016-01-07 (×12): 2 via RESPIRATORY_TRACT
  Filled 2015-12-31: qty 8.7

## 2015-12-31 MED ORDER — RISPERIDONE 1 MG PO TBDP
1.0000 mg | ORAL_TABLET | Freq: Two times a day (BID) | ORAL | Status: DC
  Administered 2015-12-31 – 2016-01-07 (×12): 1 mg via ORAL
  Filled 2015-12-31 (×22): qty 1

## 2015-12-31 MED ORDER — CITALOPRAM HYDROBROMIDE 10 MG PO TABS
10.0000 mg | ORAL_TABLET | Freq: Every day | ORAL | Status: DC
  Administered 2015-12-31 – 2016-01-01 (×2): 10 mg via ORAL
  Filled 2015-12-31 (×4): qty 1

## 2015-12-31 MED ORDER — IPRATROPIUM BROMIDE 0.02 % IN SOLN: 0.5000 mg | Freq: Four times a day (QID) | RESPIRATORY_TRACT | Status: DC | PRN

## 2015-12-31 MED ORDER — FLUTICASONE PROPIONATE 50 MCG/ACT NA SUSP: 2.0000 | Freq: Every day | NASAL | Status: DC | PRN

## 2015-12-31 MED ORDER — ALBUTEROL SULFATE HFA 108 (90 BASE) MCG/ACT IN AERS
2.0000 | INHALATION_SPRAY | Freq: Four times a day (QID) | RESPIRATORY_TRACT | Status: DC | PRN
  Administered 2016-01-01 – 2016-01-03 (×3): 2 via RESPIRATORY_TRACT
  Filled 2015-12-31: qty 6.7

## 2015-12-31 MED ORDER — TRAZODONE HCL 50 MG PO TABS
50.0000 mg | ORAL_TABLET | Freq: Every evening | ORAL | Status: DC | PRN
  Administered 2015-12-31 – 2016-01-06 (×8): 50 mg via ORAL
  Filled 2015-12-31 (×20): qty 1

## 2015-12-31 MED ORDER — CLOPIDOGREL BISULFATE 75 MG PO TABS
75.0000 mg | ORAL_TABLET | Freq: Every day | ORAL | Status: DC
  Administered 2015-12-31 – 2016-01-07 (×7): 75 mg via ORAL
  Filled 2015-12-31 (×10): qty 1

## 2015-12-31 MED ORDER — FUROSEMIDE 40 MG PO TABS: 40.0000 mg | ORAL_TABLET | Freq: Every day | ORAL | Status: DC | PRN

## 2015-12-31 MED ORDER — METFORMIN HCL 500 MG PO TABS
500.0000 mg | ORAL_TABLET | Freq: Every day | ORAL | Status: DC
  Administered 2015-12-31 – 2016-01-07 (×7): 500 mg via ORAL
  Filled 2015-12-31 (×11): qty 1

## 2015-12-31 MED ORDER — PANTOPRAZOLE SODIUM 40 MG PO TBEC
40.0000 mg | DELAYED_RELEASE_TABLET | Freq: Every day | ORAL | Status: DC
  Administered 2015-12-31 – 2016-01-07 (×7): 40 mg via ORAL
  Filled 2015-12-31 (×12): qty 1

## 2015-12-31 MED ORDER — ACETAMINOPHEN 325 MG PO TABS
650.0000 mg | ORAL_TABLET | Freq: Four times a day (QID) | ORAL | Status: DC | PRN
  Administered 2015-12-31 – 2016-01-07 (×12): 650 mg via ORAL
  Filled 2015-12-31 (×12): qty 2

## 2015-12-31 MED ORDER — ALUM & MAG HYDROXIDE-SIMETH 200-200-20 MG/5ML PO SUSP
30.0000 mL | ORAL | Status: DC | PRN
  Administered 2016-01-01 – 2016-01-06 (×4): 30 mL via ORAL
  Filled 2015-12-31 (×4): qty 30

## 2015-12-31 MED ORDER — ATORVASTATIN CALCIUM 10 MG PO TABS: 10.0000 mg | ORAL_TABLET | Freq: Every morning | ORAL | Status: DC

## 2015-12-31 MED ORDER — TAMSULOSIN HCL 0.4 MG PO CAPS
0.4000 mg | ORAL_CAPSULE | Freq: Every day | ORAL | Status: DC
Start: 2015-12-31 — End: 2016-01-07
  Administered 2015-12-31 – 2016-01-07 (×7): 0.4 mg via ORAL
  Filled 2015-12-31 (×10): qty 1

## 2015-12-31 MED ORDER — POTASSIUM CHLORIDE CRYS ER 10 MEQ PO TBCR
10.0000 meq | EXTENDED_RELEASE_TABLET | Freq: Every day | ORAL | Status: DC
Start: 2015-12-31 — End: 2016-01-07
  Administered 2015-12-31 – 2016-01-07 (×7): 10 meq via ORAL
  Filled 2015-12-31 (×11): qty 1

## 2015-12-31 MED ORDER — HYDROXYZINE HCL 25 MG PO TABS
25.0000 mg | ORAL_TABLET | Freq: Four times a day (QID) | ORAL | Status: DC
  Administered 2015-12-31 – 2016-01-02 (×12): 25 mg via ORAL
  Filled 2015-12-31 (×21): qty 1

## 2015-12-31 MED ORDER — MAGNESIUM HYDROXIDE 400 MG/5ML PO SUSP
30.0000 mL | Freq: Every day | ORAL | Status: DC | PRN
  Administered 2016-01-02 – 2016-01-04 (×2): 30 mL via ORAL
  Filled 2015-12-31 (×2): qty 30

## 2015-12-31 MED ORDER — ATORVASTATIN CALCIUM 10 MG PO TABS
10.0000 mg | ORAL_TABLET | Freq: Every day | ORAL | Status: DC
  Administered 2015-12-31 – 2016-01-06 (×5): 10 mg via ORAL
  Filled 2015-12-31 (×9): qty 1

## 2015-12-31 MED ORDER — METOPROLOL TARTRATE 12.5 MG HALF TABLET
12.5000 mg | ORAL_TABLET | Freq: Two times a day (BID) | ORAL | Status: DC
  Administered 2015-12-31 – 2016-01-07 (×12): 12.5 mg via ORAL
  Filled 2015-12-31 (×19): qty 1

## 2015-12-31 MED ORDER — FLUTICASONE PROPIONATE HFA 44 MCG/ACT IN AERO: 2.0000 | INHALATION_SPRAY | Freq: Two times a day (BID) | RESPIRATORY_TRACT | Status: DC

## 2015-12-31 MED ORDER — LEVETIRACETAM 500 MG PO TABS
500.0000 mg | ORAL_TABLET | Freq: Two times a day (BID) | ORAL | Status: DC
  Administered 2015-12-31 – 2016-01-07 (×12): 500 mg via ORAL
  Filled 2015-12-31 (×20): qty 1

## 2015-12-31 NOTE — ED Notes (Signed)
Patient reportst SI with plan to walk out in traffic. Patient denies HI and AVH at this time. Plan of care discussed with patient. Patient voices no complaints or concerns at this time. Encouragement and support provided and safety maintain. Q 15 min safety checks remain in place.

## 2015-12-31 NOTE — Progress Notes (Signed)
Patient did not attended AA group meeting.  

## 2015-12-31 NOTE — [facility] Group Notes (Signed)
[place] LCSW Group Therapy 12/31/2015  1:15 PM Type of Therapy: Group Therapy Participation Level: Minimal  Participation Quality: Attentive  Affect: Depressed and Flat  Cognitive: Alert and Oriented  Insight: Developing/Improving and Engaged  Engagement in Therapy: Developing/Improving and Engaged  Modes of Intervention: Clarification, Confrontation, Discussion, Education, Exploration, Limit-setting, Orientation, Problem-solving, Rapport Building, Art therapist, Socialization and Support  Summary of Progress/Problems: The topic for group today was emotional regulation. This group focused on both positive and negative emotion identification and allowed group members to process ways to identify feelings, regulate negative emotions, and find healthy ways to manage internal/external emotions. Group members were asked to reflect on a time when their reaction to an emotion led to a negative outcome and explored how alternative responses using emotion regulation would have benefited them. Group members were also asked to discuss a time when emotion regulation was utilized when a negative emotion was experienced. Patient participated minimally in discussion despite CSW encouragement. He shared that he has difficulty concentrating and having "self-control".  Tilden Fossa, MSW, [place] Clinical Social Worker [facility] [phone]

## 2015-12-31 NOTE — Tx Team (Signed)
Interdisciplinary Treatment and Diagnostic Plan Update  12/31/2015 Time of Session: 9:30am FLOR HOUDESHELL MRN: [id number]  Principal Diagnosis: Cocaine abuse with cocaine-induced mood disorder (HCC)  Secondary Diagnoses: Active Problems:   Bipolar 1 disorder, depressed, severe (HCC)   Current Medications:  Current Facility-Administered Medications  Medication Dose Route Frequency Provider Last Rate Last Dose  . acetaminophen (TYLENOL) tablet 650 mg  650 mg Oral Q6H PRN Kerry Hough, PA-C   650 mg at 12/31/15 9021  . albuterol (PROVENTIL HFA;VENTOLIN HFA) 108 (90 Base) MCG/ACT inhaler 2 puff  2 puff Inhalation Q6H PRN Kerry Hough, PA-C      . alum & mag hydroxide-simeth (MAALOX/MYLANTA) 200-200-20 MG/5ML suspension 30 mL  30 mL Oral Q4H PRN Kerry Hough, PA-C      . atorvastatin (LIPITOR) tablet 10 mg  10 mg Oral q1800 Rockey Situ Cobos, MD      . beclomethasone (QVAR) 80 MCG/ACT inhaler 2 puff  2 puff Inhalation BID Kerry Hough, PA-C   2 puff at 12/31/15 [phone]  . citalopram (CELEXA) tablet 10 mg  10 mg Oral Daily Kerry Hough, PA-C   10 mg at 12/31/15 2080  . clopidogrel (PLAVIX) tablet 75 mg  75 mg Oral Daily Kerry Hough, PA-C   75 mg at 12/31/15 2233  . fluticasone (FLONASE) 50 MCG/ACT nasal spray 2 spray  2 spray Each Nare Daily PRN Kerry Hough, PA-C      . furosemide (LASIX) tablet 40 mg  40 mg Oral Daily PRN Kerry Hough, PA-C      . hydrochlorothiazide (HYDRODIURIL) tablet 25 mg  25 mg Oral Daily Kerry Hough, PA-C   25 mg at 12/31/15 0811  . hydrOXYzine (ATARAX/VISTARIL) tablet 25 mg  25 mg Oral Q6H Spencer E Simon, PA-C   25 mg at 12/31/15 0731  . ipratropium (ATROVENT) nebulizer solution 0.5 mg  0.5 mg Nebulization Q6H PRN Kerry Hough, PA-C      . levETIRAcetam (KEPPRA) tablet 500 mg  500 mg Oral Q12H Kerry Hough, PA-C   500 mg at 12/31/15 6122  . magnesium hydroxide (MILK OF MAGNESIA) suspension 30 mL  30 mL Oral Daily PRN Kerry Hough,  PA-C      . metFORMIN (GLUCOPHAGE) tablet 500 mg  500 mg Oral Q breakfast Kerry Hough, PA-C   500 mg at 12/31/15 4497  . metoprolol tartrate (LOPRESSOR) tablet 12.5 mg  12.5 mg Oral BID Kerry Hough, PA-C   12.5 mg at 12/31/15 5300  . pantoprazole (PROTONIX) EC tablet 40 mg  40 mg Oral Daily Kerry Hough, PA-C   40 mg at 12/31/15 5110  . potassium chloride (K-DUR,KLOR-CON) CR tablet 10 mEq  10 mEq Oral Daily Kerry Hough, PA-C   10 mEq at 12/31/15 2111  . risperiDONE (RISPERDAL M-TABS) disintegrating tablet 1 mg  1 mg Oral BID Kerry Hough, PA-C   1 mg at 12/31/15 7356  . tamsulosin (FLOMAX) capsule 0.4 mg  0.4 mg Oral QPC breakfast Kerry Hough, PA-C   0.4 mg at 12/31/15 7014  . traZODone (DESYREL) tablet 50 mg  50 mg Oral QHS,MR X 1 Kerry Hough, PA-C   50 mg at 12/31/15 0211   PTA Medications: Prescriptions Prior to Admission  Medication Sig Dispense Refill Last Dose  . albuterol (PROVENTIL HFA;VENTOLIN HFA) 108 (90 Base) MCG/ACT inhaler Inhale 2 puffs into the lungs every 6 (six) hours as needed for wheezing or  shortness of breath.   Not Taking at Unknown time  . atorvastatin (LIPITOR) 10 MG tablet Take 1 tablet (10 mg total) by mouth every morning. (Patient not taking: Reported on 12/30/2015) 30 tablet 0 Not Taking at Unknown time  . beclomethasone (QVAR) 80 MCG/ACT inhaler Inhale 2 puffs into the lungs 2 (two) times daily. (Patient not taking: Reported on 12/30/2015) 1 Inhaler 12 Not Taking at Unknown time  . cetirizine (ZYRTEC) 10 MG tablet Take 1 tablet (10 mg total) by mouth daily. (Patient not taking: Reported on 12/30/2015)   Not Taking at Unknown time  . citalopram (CELEXA) 10 MG tablet Take 1 tablet (10 mg total) by mouth daily. (Patient not taking: Reported on 12/30/2015) 14 tablet 0 Not Taking at Unknown time  . clindamycin (CLEOCIN) 300 MG capsule Take 1 capsule (300 mg total) by mouth 3 (three) times daily. (Patient not taking: Reported on 12/30/2015) 30 capsule 0  Not Taking at Unknown time  . clopidogrel (PLAVIX) 75 MG tablet Take 1 tablet (75 mg total) by mouth daily. (Patient not taking: Reported on 12/30/2015)   Not Taking at Unknown time  . esomeprazole (NEXIUM) 40 MG capsule Take 1 capsule (40 mg total) by mouth 2 (two) times daily before a meal. (Patient not taking: Reported on 12/30/2015)   Not Taking at Unknown time  . fluticasone (FLONASE) 50 MCG/ACT nasal spray Place 2 sprays into both nostrils daily as needed for allergies or rhinitis.   Not Taking at Unknown time  . fluticasone (FLOVENT HFA) 44 MCG/ACT inhaler Inhale 2 puffs into the lungs 2 (two) times daily. (Patient not taking: Reported on 12/30/2015) 1 Inhaler 12 Not Taking at Unknown time  . furosemide (LASIX) 40 MG tablet Take 40 mg by mouth daily as needed for fluid.   Not Taking at Unknown time  . hydrochlorothiazide (HYDRODIURIL) 25 MG tablet Take 1 tablet (25 mg total) by mouth daily. (Patient not taking: Reported on 12/30/2015) 30 tablet 0 Not Taking at Unknown time  . hydrOXYzine (ATARAX/VISTARIL) 25 MG tablet Take 25 mg by mouth every 6 (six) hours.   Not Taking at Unknown time  . ipratropium (ATROVENT) 0.02 % nebulizer solution Take 0.5 mg by nebulization every 6 (six) hours as needed for wheezing or shortness of breath.   Not Taking at Unknown time  . levETIRAcetam (KEPPRA) 500 MG tablet Take 1 tablet (500 mg total) by mouth every 12 (twelve) hours. 60 tablet 0 Unknown  . metFORMIN (GLUCOPHAGE) 500 MG tablet Take 1 tablet (500 mg total) by mouth daily with breakfast. 30 tablet 0 Unknown  . metoprolol tartrate (LOPRESSOR) 25 MG tablet Take 0.5 tablets (12.5 mg total) by mouth 2 (two) times daily. (Patient not taking: Reported on 12/30/2015) 60 tablet 0 Not Taking at Unknown time  . potassium chloride (K-DUR,KLOR-CON) 10 MEQ tablet Take 1 tablet (10 mEq total) by mouth daily. (Patient not taking: Reported on 12/30/2015)   Not Taking at Unknown time  . risperiDONE (RISPERDAL M-TABS) 1 MG  disintegrating tablet Take 1 tablet (1 mg total) by mouth 2 (two) times daily. (Patient not taking: Reported on 12/30/2015) 28 tablet 0 Not Taking at Unknown time  . tamsulosin (FLOMAX) 0.4 MG CAPS capsule Take 1 capsule (0.4 mg total) by mouth daily after breakfast. (Patient not taking: Reported on 12/30/2015) 30 capsule  Not Taking at Unknown time  . traZODone (DESYREL) 50 MG tablet Take 1 tablet (50 mg total) by mouth at bedtime as needed for sleep. (Patient not taking:  Reported on 12/30/2015) 30 tablet 0 Not Taking at Unknown time    Patient Stressors: Financial difficulties Health problems Marital or family conflict Medication change or noncompliance Substance abuse  Patient Strengths: Average or above average intelligence Capable of independent living General fund of knowledge  Treatment Modalities: Medication Management, Group therapy, Case management,  1 to 1 session with clinician, Psychoeducation, Recreational therapy.   Physician Treatment Plan for Primary Diagnosis: Cocaine abuse with cocaine-induced mood disorder ([place]) Long Term Goal(s):     Short Term Goals:    Medication Management: Evaluate patient's response, side effects, and tolerance of medication regimen.  Therapeutic Interventions: 1 to 1 sessions, Unit Group sessions and Medication administration.  Evaluation of Outcomes: Not Met  Physician Treatment Plan for Secondary Diagnosis: Active Problems:   Bipolar 1 disorder, depressed, severe ([place])  Long Term Goal(s):     Short Term Goals:       Medication Management: Evaluate patient's response, side effects, and tolerance of medication regimen.  Therapeutic Interventions: 1 to 1 sessions, Unit Group sessions and Medication administration.  Evaluation of Outcomes: Not Met   RN Treatment Plan for Primary Diagnosis: Cocaine abuse with cocaine-induced mood disorder ([place]) Long Term Goal(s): Knowledge of disease and therapeutic regimen to maintain health will  improve  Short Term Goals: Ability to remain free from injury will improve, Ability to disclose and discuss suicidal ideas, Ability to identify and develop effective coping behaviors will improve and Compliance with prescribed medications will improve  Medication Management: RN will administer medications as ordered by provider, will assess and evaluate patient's response and provide education to patient for prescribed medication. RN will report any adverse and/or side effects to prescribing provider.  Therapeutic Interventions: 1 on 1 counseling sessions, Psychoeducation, Medication administration, Evaluate responses to treatment, Monitor vital signs and CBGs as ordered, Perform/monitor CIWA, COWS, AIMS and Fall Risk screenings as ordered, Perform wound care treatments as ordered.  Evaluation of Outcomes: Not Met   LCSW Treatment Plan for Primary Diagnosis: Cocaine abuse with cocaine-induced mood disorder ([place]) Long Term Goal(s): Safe transition to appropriate next level of care at discharge, Engage patient in therapeutic group addressing interpersonal concerns.  Short Term Goals: Engage patient in aftercare planning with referrals and resources, Increase social support, Increase emotional regulation, Identify triggers associated with mental health/substance abuse issues and Increase skills for wellness and recovery  Therapeutic Interventions: Assess for all discharge needs, 1 to 1 time with Social worker, Explore available resources and support systems, Assess for adequacy in community support network, Educate family and significant other(s) on suicide prevention, Complete Psychosocial Assessment, Interpersonal group therapy.  Evaluation of Outcomes: Not Met   Progress in Treatment :  Attending groups: Continuing to assess  Participating in groups: Continuing to assess  Taking medication as prescribed: Yes, MD continuing to assess for appropriate medication regimen  Toleration  medication: Yes  Family/Significant other contact made: No, patient has declined for collateral contact   Patient understands diagnosis: Yes  Discussing patient identified problems/goals with staff: Yes  Medical problems stabilized or resolved: Yes  Denies suicidal/homicidal ideation: Treatment team continuing to asses  Issues/concerns per patient self-inventory: None reported  Other: N/A  New problem(s) identified: None reported at this time    New Short Term/Long Term Goal(s): None at this time    Discharge Plan or Barriers: [facility] referral made 12/31/15.    Reason for Continuation of Hospitalization: Anxiety Depression Medication stabilization Suicidal Ideations Withdrawal symptoms  Estimated Length of Stay: 3-5 days  Attendees:  Patient:   Physician: Dr. Sharolyn Douglas, Dr. Shea Evans, MD  12/31/2015   9:30am  Nursing: Otilio Carpen, Franchot Erichsen, Loletta Specter, RN 12/31/2015 9:30am  RN Care Manager:   Social Workers: Peri Maris, LCSW, Tilden Fossa, LCSW, [place], [place]   12/31/2015 9:30am  Nurse Pratictioners: Samuel Jester, NP, Catalina Pizza, NP 12/31/2015 9:30am  Other:     Scribe for Treatment Team: Tilden Fossa, [place] Worker [facility] [phone]

## 2015-12-31 NOTE — Progress Notes (Signed)
Patient ID: Dalton [name], male   DOB: 05/28/1960, 55 y.o.   MRN: [id number] D: Client seen in dayroom watching TV early evening but retrieved to his room when group started. Client is sullen, passive SI, just responds of life "it doesn't matter" no intent expressed. A: Writer provided emotional support, reviewed medications, administered as ordered. Staff will monitor q40min forsafety. R: Client safe on the unit.

## 2015-12-31 NOTE — ED Provider Notes (Signed)
Accepted to [facility]. Dr. Parke Poisson to admit   Dalton Gambler, MD 12/31/15 [phone]

## 2015-12-31 NOTE — ED Notes (Signed)
Patient discharged via ambulatory with a steady gait. Respirations equal and unlabored, skin warm and dry. NAD. Patient discharge in [facility] custody to [facility]. Patient signed for belongings.
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
## 2015-12-31 NOTE — [facility] Suicide Risk Assessment (Signed)
[place] INPATIENT:  Family/Significant Other Suicide Prevention Education  Suicide Prevention Education:  Patient Refusal for Family/Significant Other Suicide Prevention Education: The patient Dalton [name] has refused to provide written consent for family/significant other to be provided Family/Significant Other Suicide Prevention Education during admission and/or prior to discharge.  Physician notified. SPE reviewed with patient and brochure provided. Patient encouraged to return to hospital if having suicidal thoughts, patient verbalized his/her understanding and has no further questions at this time.   [name] 12/31/2015, 10:47 AM
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
## 2015-12-31 NOTE — Progress Notes (Signed)
D:  Patient awake and alert; oriented x 4;  Patient reports passive suicidal ideation but he denies homicidal ideation and AVH; no self-injurious behaviors noted or reported. A: Verbally contracts for safety; Medications given as scheduled;  Emotional support provided; encouraged him to seek assistance with needs/concerns. R:  Safety maintained on unit.
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
## 2015-12-31 NOTE — Progress Notes (Signed)
CSW left voicemail for [name] at the [facility] to inquire about eligibility and availability. Awaiting return call.  Tilden Fossa, LCSW Clinical Social Worker [facility] [phone]

## 2015-12-31 NOTE — BHH Group Notes (Signed)
Adult Psychoeducational Group Note  Date:  12/31/2015 Time:  1:26 PM  Group Topic/Focus:  Goals Group:   The focus of this group is to help patients establish daily goals to achieve during treatment and discuss how the patient can incorporate goal setting into their daily lives to aide in recovery.   Participation Level:  Active  Participation Quality:  Appropriate  Affect:  Appropriate  Cognitive:  Appropriate  Insight: Appropriate  Engagement in Group:  Engaged  Modes of Intervention:  Discussion  Additional Comments:  Pt stated that his goal for today was to speak with social worker. Pt spoke with social worker proir to attending group. Pt was alert and engaged in group activity. Huel Cote 12/31/2015, 1:26 PM
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
## 2015-12-31 NOTE — Tx Team (Signed)
Initial Treatment Plan 12/31/2015 3:28 AM Dalton [name] [id number]    PATIENT STRESSORS: Financial difficulties Health problems Marital or family conflict Medication change or noncompliance Substance abuse   PATIENT STRENGTHS: Average or above average intelligence Capable of independent living General fund of knowledge   PATIENT IDENTIFIED PROBLEMS: Depression  Substance abuse (cocaine)  Risk for self harm  Homelessness  Chronic pain/multiple health issues    "I need help finding a good place to stay"  "Stop using cocaine"       DISCHARGE CRITERIA:  Ability to meet basic life and health needs Adequate post-discharge living arrangements Improved stabilization in mood, thinking, and/or behavior Motivation to continue treatment in a less acute level of care Need for constant or close observation no longer present Verbal commitment to aftercare and medication compliance Withdrawal symptoms are absent or subacute and managed without 24-hour nursing intervention  PRELIMINARY DISCHARGE PLAN: Attend aftercare/continuing care group Attend 12-step recovery group Outpatient therapy Placement in alternative living arrangements  PATIENT/FAMILY INVOLVEMENT: This treatment plan has been presented to and reviewed with the patient, Dalton [name], and/or family member.  The patient and family have been given the opportunity to ask questions and make suggestions.  Ronney Asters, RN 12/31/2015, 3:28 AM

## 2015-12-31 NOTE — Plan of Care (Signed)
Problem: Medication: Goal: Compliance with prescribed medication regimen will improve Outcome: Progressing Compliant with medication regimen  Problem: Self-Concept: Goal: Ability to disclose and discuss suicidal ideas will improve Outcome: Progressing Verbally contracts for safety

## 2015-12-31 NOTE — Progress Notes (Signed)
Recreation Therapy Notes  Date:  12/31/15 Time: 0930 Location: 300 Hall Dayroom  Group Topic: Stress Management  Goal Area(s) Addresses:  Patient will verbalize importance of using healthy stress management.  Patient will identify positive emotions associated with healthy stress management.   Behavioral Response: Engaged  Intervention: Calm App  Activity :  Forgiveness of Self Imagery.  LRT introduced the concept of guided imagery.  LRT played an imagery meditation from the Calm app so patients could engage and participate in the activity.  Patients were to follow along with the recording to participate in the activity.    Education:  Stress Management, Discharge Planning.   Education Outcome: Acknowledges edcuation/In group clarification offered/Needs additional education  Clinical Observations/Feedback: Pt attended group.    Victorino Sparrow, LRT/CTRS         Victorino Sparrow A 12/31/2015 11:43 AM

## 2016-01-01 LAB — GLUCOSE, CAPILLARY: GLUCOSE-CAPILLARY: 98 mg/dL (ref 65–99)

## 2016-01-01 MED ORDER — CITALOPRAM HYDROBROMIDE 20 MG PO TABS
20.0000 mg | ORAL_TABLET | Freq: Every day | ORAL | Status: DC
  Administered 2016-01-02 – 2016-01-07 (×5): 20 mg via ORAL
  Filled 2016-01-01 (×8): qty 1
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
## 2016-01-01 NOTE — Progress Notes (Signed)
CSW left voicemail for care coordinator Kathee Polite [phone] to inform her of [facility] referral pending.   Tilden Fossa, LCSW Clinical Social Worker [facility] [phone]
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
## 2016-01-01 NOTE — Progress Notes (Signed)
Patient ID: Dalton [name], male   DOB: December 04, 1960, 55 y.o.   MRN: [id number] D: Client in dayroom interacting with peers, playing cards, watching the game. A: Writer provided emotional support, encouraged him to verbalize concerns. Medications reviewed, administered as ordered. Staff will monitor q109min for safety. R: Client is safe on the unit, attended karaoke.

## 2016-01-01 NOTE — Progress Notes (Signed)
Patient ID: Dalton [name], male   DOB: Dec 19, 1960, 55 y.o.   MRN: [id number]  DAR: Pt. Denies SI/HI and A/V Hallucinations but he is able to contract for safety if he begins to feel unsafe. He reports sleep is poor, appetite is fair, energy level is low, and concentration is poor. He rates depression 8/10, hopelessness 8/10, and anxiety 7/10. PRN albuterol was administered to patient for shortness of breath which provided relief. Patient reported jaw pain and PRN medications are administered. Support and encouragement provided to the patient. Scheduled medications administered to patient late this morning due to patient not getting out of bed. Patient was prompted to come to medication window several times but did not although stated he would. Patient is seen in the milieu as the morning progresses into afternoon. He is appropriate with other patients on the hall at this time.Q15 minute checks are maintained for safety.

## 2016-01-01 NOTE — Progress Notes (Signed)
Pt attended karaoke group this evening.  
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
## 2016-01-02 LAB — GLUCOSE, CAPILLARY: GLUCOSE-CAPILLARY: 116 mg/dL — AB (ref 65–99)

## 2016-01-02 MED ORDER — HYDROXYZINE HCL 25 MG PO TABS
25.0000 mg | ORAL_TABLET | Freq: Four times a day (QID) | ORAL | Status: DC | PRN
Start: 2016-01-02 — End: 2016-01-07
  Administered 2016-01-03 (×2): 25 mg via ORAL

## 2016-01-02 NOTE — [facility] Group Notes (Signed)
[place] LCSW Group Therapy 01/02/2016 1:15 PM Type of Therapy: Group Therapy Participation Level: Active  Participation Quality: Attentive, Sharing and Supportive  Affect: Depressed and Flat  Cognitive: Alert and Oriented  Insight: Developing/Improving and Engaged  Engagement in Therapy: Developing/Improving and Engaged  Modes of Intervention: Clarification, Confrontation, Discussion, Education, Exploration, Limit-setting, Orientation, Problem-solving, Rapport Building, Art therapist, Socialization and Support  Summary of Progress/Problems: The topic for today was feelings about relapse. Pt discussed what relapse prevention is to them and identified triggers that they are on the path to relapse. Pt processed their feeling towards relapse and was able to relate to peers. Pt discussed coping skills that can be used for relapse prevention. Patient discussed feeling frustrated and disappointed that his family is not willing to understand addiction or the process of recovery.    Tilden Fossa, MSW, [place] Clinical Social Worker [facility] [phone]

## 2016-01-02 NOTE — Progress Notes (Signed)
D.  Pt pleasant on approach, complaint of constipation.  Pt did attend evening AA group, observed interacting appropriately with peers on the unit.  Pt does endorse intermittent passive SI, denies HI/hallucinations at this time.  A.  Support and encouragement offered, MOM given for constipation.  R.  Pt remains safe on the unit, will continue to monitor.

## 2016-01-02 NOTE — Progress Notes (Signed)
Patient did attend the evening speaker AA meeting.  

## 2016-01-02 NOTE — Progress Notes (Signed)
DAR NOTE: Patient presents with anxious affect and depressed mood. Pt complained diarrhea, stayed in the bed most of the day. Denies auditory and visual hallucinations.  Rates depression at 0, hopelessness at 0, and anxiety at 0.  Maintained on routine safety checks.  Medications given as prescribed.  Support and encouragement offered as needed. Will continue to monitor.
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
## 2016-01-02 NOTE — Progress Notes (Signed)
Recreation Therapy Notes  Date: 01/02/16 Time: 0930 Location: 300 [address] Dayroom  Group Topic: Stress Management  Goal Area(s) Addresses:  Patient will verbalize importance of using healthy stress management.  Patient will identify positive emotions associated with healthy stress management.   Intervention: Stress Management  Activity :  Progressive Muscle Relaxation.  LRT introduced the stress management technique of progressive muscle relaxation.  LRT read a script to guide patients through the technique.  Patients were to follow along as LRT read script.  Education:  Stress Management, Discharge Planning.   Education Outcome: Acknowledges edcuation/In group clarification offered/Needs additional education  Clinical Observations/Feedback: Pt did not attend  group.    Victorino Sparrow, LRT/CTRS         Victorino Sparrow A 01/02/2016 11:44 AM

## 2016-01-03 MED ORDER — MAGNESIUM CITRATE PO SOLN
1.0000 | Freq: Once | ORAL | Status: AC
  Administered 2016-01-03: 1 via ORAL
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
## 2016-01-03 NOTE — [facility] Group Notes (Signed)
Patient attended and actively participated in nurse-led psycho-educational group.

## 2016-01-03 NOTE — Progress Notes (Signed)
D.  Pt pleasant on approach, complaint of mild headache.  Pt positive for evening wrap up group, observed afterwards playing cards and interacting appropriately with peers on the unit.  Pt denies SI/HI/halluciantions at this time but does report intermittent continued passive SI.  A.  Support and encouragement offered, medications given as ordered  R.  Pt remains safe on the unit, will continue to monitor.

## 2016-01-03 NOTE — Progress Notes (Signed)
Patient denies SI HI and AVH.  Patient reported beginning to feel better and feeling guilty about falling back into his addiction.   Patient attended groups and engaged fully. Patient has bright and pleasant affect.   Assess patient for safety, offer medications as prescribed, engage patient in 1:1 staff talks  Continue to monitor

## 2016-01-03 NOTE — Progress Notes (Signed)
[facility] Group Notes:  (Nursing/MHT/Case Management/Adjunct)  Date:  01/03/2016  Time:  2100  Type of Therapy:  wrap up group  Participation Level:  Active  Participation Quality:  Appropriate, Attentive, Sharing and Supportive  Affect:  Appropriate  Cognitive:  Appropriate  Insight:  Improving  Engagement in Group:  Engaged  Modes of Intervention:  Clarification, Education and Support  Summary of Progress/Problems: Pt reports going to [facility] after discharge.    Shellia Cleverly 01/03/2016, 10:43 PM
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
## 2016-01-04 NOTE — Progress Notes (Signed)
DAR NOTE: Pt present with flat affect and depressed mood in the unit. Pt complained of acid reflux and asked his Protonix  to scheduled x3 a day. Pt denies physical pain, took all his meds as scheduled. As per self inventory, pt had fair night sleep, fair appetite, normal energy, and good concentration. Pt rate depression at 6, hopeless ness at 6, and anxiety at 6. Pt's safety ensured with 15 minute and environmental checks. Pt currently denies SI/HI and A/V hallucinations. Pt verbally agrees to seek staff if SI/HI or A/VH occurs and to consult with staff before acting on these thoughts. Will continue POC.

## 2016-01-04 NOTE — Progress Notes (Signed)
I received a call from [facility] form [facility] stating that Dalton [name] had called them reporting that when he was discharged from our care that he was going to kill himself. I immediately notified an Therapist, sports.

## 2016-01-04 NOTE — BHH Group Notes (Signed)
[place] Group Notes:  (Nursing/MHT/Case Management/Adjunct)  Date:  01/04/2016  Time:  5:38 PM Type of Therapy:  Psychoeducational Skills  Participation Level:  Active  Participation Quality:  Appropriate  Affect:  Appropriate  Cognitive:  Appropriate  Insight:  Appropriate  Engagement in Group:  Engaged  Modes of Intervention:  Problem-solving  Summary of Progress/Problems: Group encouraged to surround themselves with positive and healthy group/support system when changing to a healthy life style.      Dalton [name] 01/04/2016, 5:38 PM

## 2016-01-04 NOTE — BHH Group Notes (Signed)
Adult Therapy Group Note  Date: 01/04/2016  Time:  10:00-11:00AM  Group Topic/Focus: Healthy Support Systems   Building Self Esteem:   The focus of this group was to assist patients in identifying their current healthy supports as well as to list and discuss other supports that can be put in place to help with achieving life goals.  These items included supports such as 12-step groups, individual therapy, psychiatrists, children, faith activities, accountability partner, sponsor, group therapy, support groups, classes on mental health, phone apps/YouTube, gym/exercise, and more.  A song was  played at the end of group with a short discussion about using music as an additional support/coping mechanism.    Participation Level:  Active  Participation Quality:  Attentive and Sharing  Affect:  Blunted and Depressed  Cognitive:  Alert and Appropriate  Insight: Good  Engagement in Group:  Engaged  Modes of Intervention:  Activity, Discussion and Support  Additional Comments:  The patient expressed that a current health support is his peers in the hospital, while his family and he himself are unhealthy supports in his life.  Maretta Los, LCSW 01/04/2016  11:35 AM

## 2016-01-04 NOTE — Plan of Care (Signed)
Problem: Activity: Goal: Interest or engagement in activities will improve Outcome: Progressing Pt actively engaged in group participation

## 2016-01-04 NOTE — BHH Counselor (Signed)
Clinical Social Work Note  [facility] informed staff that he would not be able to come there for rehab due to his medical acuity.  CSW informed pt of this, and that they had recommended [facility].  He was calm and accepting about this.  Later, however, CSW was informed that a phone message from pt had been reported by [facility] back to [facility] staff, in which he stated that when he is discharged from [facility] he will kill himself.  RN and CSW attempted to meet with pt to talk about this and to share that the [facility] referral has already been started.  He refused to meet with [place], simply said "I'm done, no, I'm done."    Selmer Dominion, LCSW 01/04/2016, 4:56 PM

## 2016-01-04 NOTE — Progress Notes (Signed)
D: Pt refused evening vital signs and stated "It will all be over tomorrow when I discharge".  A: Nurse notified, encouragement offered from Probation officer.  R: Q15 minute checks will continue for pt safety. Pt is currently safe on the unit.

## 2016-01-04 NOTE — Progress Notes (Signed)
Pt has been waiting to get [facility], but was informed this afternoon by Hortense Ramal SW, that [facility] will not take him. Pt then called [facility] and left a voice message sating  that if the pt get discharged from here, he will go and kill himself. [facility] called back to inform staff. The Probation officer and SW Otilio Connors went to talk to the pt and to inform him about [facility] as an alternative, but pt refuse to talk stating he did not want here it.

## 2016-01-04 NOTE — Progress Notes (Addendum)
D.  Pt very calm but resolute on approach.  Pt states he has had enough.  Pt states that he had "to fight to get back in here" and now he is getting "the same story" about placement.  Pt states he feels so much despair.  He states that he feels like no one really cares.  Pt states that he was told that  [facility] reported that they couldn't take him due to medical reasons and no doctor being present on staff but states that he does not understand why he could not have been told this before they interviewed him and before waiting all this time to learn he can't go.  He feels as if he is "getting the run around constantly from everyone".  Pt states he is calling [facility] when he leaves and will make sure he "finishes it" this time.  Pt states that maybe that will be what it takes to get change.  Pt refusing all medications as well as morning CBG.  Pt also refusing to eat.  A.  Support and encouragement offered, assured Pt that this writer will write detailed note about his situation.  Let Pt know that this writer will be available to meet any need tonight that can possibly be met while he is here.  R.  Pt playing cards with peers. Stated "have you got a .38?" when asked if this writer could get him anything.  Will continue to monitor closely.

## 2016-01-05 LAB — GLUCOSE, CAPILLARY
GLUCOSE-CAPILLARY: 99 mg/dL (ref 65–99)
Glucose-Capillary: 91 mg/dL (ref 65–99)

## 2016-01-05 NOTE — Progress Notes (Signed)
Patient has continued to refused VS, CBG, medications throughout the morning and at lunch.  Patient has been sitting in dayroom, talking and laughing with peers, and continues to refuse all medications, CBG, VS.  MD, charge nurse, SW aware of patient's behavior. Respirations even and unlabored.  No signs/symptoms of pain/distress noted on patient's face/body movements.  Safety maintained with 15 minute checks.

## 2016-01-05 NOTE — Progress Notes (Signed)
Adult Psychoeducational Group Note  Date:  01/05/2016 Time:  10:33 PM  Group Topic/Focus:  Wrap-Up Group:   The focus of this group is to help patients review their daily goal of treatment and discuss progress on daily workbooks.   Participation Level:  Active  Participation Quality:  Appropriate  Affect:  Appropriate  Cognitive:  Alert  Insight: Appropriate  Engagement in Group:  Engaged  Modes of Intervention:  Discussion  Additional Comments:  Patient states, "I had a terrible day".  Dalton [name] 01/05/2016, 10:33 PM

## 2016-01-05 NOTE — Progress Notes (Signed)
Recreation Therapy Notes  Date: 01/05/16 Time: 0930 Location: [address] Dayroom  Group Topic: Stress Management  Goal Area(s) Addresses:  Patient will verbalize importance of using healthy stress management.  Patient will identify positive emotions associated with healthy stress management.   Behavioral Response: Engaged  Intervention: Stress Management  Activity :  Peaceful Waves.  LRT introduced to the stress management technique of guided imagery.  LRT read a script to engage patients in the activity.  Patients were to follow along as LRT read script to participate in activity.  Education:  Stress Management, Discharge Planning.   Education Outcome: Acknowledges edcuation/In group clarification offered/Needs additional education  Clinical Observations/Feedback: Pt attended group.    Victorino Sparrow, LRT/CTRS         Victorino Sparrow A 01/05/2016 12:15 PM

## 2016-01-05 NOTE — Tx Team (Addendum)
Interdisciplinary Treatment and Diagnostic Plan Update  01/05/2016 Time of Session: 9:30am Dalton [name] MRN: [id number]  Principal Diagnosis: Cocaine abuse with cocaine-induced mood disorder [facility])  Current Medications:  Current Facility-Administered Medications  Medication Dose Route Frequency Provider Last Rate Last Dose  . acetaminophen (TYLENOL) tablet 650 mg  650 mg Oral Q6H PRN Laverle Hobby, PA-C   650 mg at 01/03/16 1940  . albuterol (PROVENTIL HFA;VENTOLIN HFA) 108 (90 Base) MCG/ACT inhaler 2 puff  2 puff Inhalation Q6H PRN Laverle Hobby, PA-C   2 puff at 01/03/16 1828  . alum & mag hydroxide-simeth (MAALOX/MYLANTA) 200-200-20 MG/5ML suspension 30 mL  30 mL Oral Q4H PRN Laverle Hobby, PA-C   30 mL at 01/04/16 1420  . atorvastatin (LIPITOR) tablet 10 mg  10 mg Oral q1800 Jenne Campus, MD   10 mg at 01/03/16 1826  . beclomethasone (QVAR) 80 MCG/ACT inhaler 2 puff  2 puff Inhalation BID Laverle Hobby, PA-C   2 puff at 01/04/16 [phone]  . citalopram (CELEXA) tablet 20 mg  20 mg Oral Daily Linard Millers, MD   20 mg at 01/04/16 0813  . clopidogrel (PLAVIX) tablet 75 mg  75 mg Oral Daily Laverle Hobby, PA-C   75 mg at 01/04/16 0813  . fluticasone (FLONASE) 50 MCG/ACT nasal spray 2 spray  2 spray Each Nare Daily PRN Laverle Hobby, PA-C      . furosemide (LASIX) tablet 40 mg  40 mg Oral Daily PRN Laverle Hobby, PA-C      . hydrochlorothiazide (HYDRODIURIL) tablet 25 mg  25 mg Oral Daily Laverle Hobby, PA-C   25 mg at 01/04/16 0814  . hydrOXYzine (ATARAX/VISTARIL) tablet 25 mg  25 mg Oral Q6H PRN Rozetta Nunnery, NP   25 mg at 01/03/16 2202  . ipratropium (ATROVENT) nebulizer solution 0.5 mg  0.5 mg Nebulization Q6H PRN Laverle Hobby, PA-C      . levETIRAcetam (KEPPRA) tablet 500 mg  500 mg Oral Q12H Laverle Hobby, PA-C   500 mg at 01/04/16 [id number]  . magnesium hydroxide (MILK OF MAGNESIA) suspension 30 mL  30 mL Oral Daily PRN Laverle Hobby, PA-C   30 mL at  01/04/16 1420  . metFORMIN (GLUCOPHAGE) tablet 500 mg  500 mg Oral Q breakfast Laverle Hobby, PA-C   500 mg at 01/04/16 0813  . metoprolol tartrate (LOPRESSOR) tablet 12.5 mg  12.5 mg Oral BID Laverle Hobby, PA-C   12.5 mg at 01/04/16 0813  . pantoprazole (PROTONIX) EC tablet 40 mg  40 mg Oral Daily Laverle Hobby, PA-C   40 mg at 01/04/16 [id number]  . potassium chloride (K-DUR,KLOR-CON) CR tablet 10 mEq  10 mEq Oral Daily Laverle Hobby, PA-C   10 mEq at 01/04/16 [id number]  . risperiDONE (RISPERDAL M-TABS) disintegrating tablet 1 mg  1 mg Oral BID Laverle Hobby, PA-C   1 mg at 01/04/16 [id number]  . tamsulosin (FLOMAX) capsule 0.4 mg  0.4 mg Oral QPC breakfast Laverle Hobby, PA-C   0.4 mg at 01/04/16 0813  . traZODone (DESYREL) tablet 50 mg  50 mg Oral QHS,MR X 1 Laverle Hobby, PA-C   50 mg at 01/03/16 2202   PTA Medications: Prescriptions Prior to Admission  Medication Sig Dispense Refill Last Dose  . albuterol (PROVENTIL HFA;VENTOLIN HFA) 108 (90 Base) MCG/ACT inhaler Inhale 2 puffs into the lungs every 6 (six) hours as needed for wheezing or shortness  of breath.   Not Taking at Unknown time  . atorvastatin (LIPITOR) 10 MG tablet Take 1 tablet (10 mg total) by mouth every morning. (Patient not taking: Reported on 12/30/2015) 30 tablet 0 Not Taking at Unknown time  . beclomethasone (QVAR) 80 MCG/ACT inhaler Inhale 2 puffs into the lungs 2 (two) times daily. (Patient not taking: Reported on 12/30/2015) 1 Inhaler 12 Not Taking at Unknown time  . cetirizine (ZYRTEC) 10 MG tablet Take 1 tablet (10 mg total) by mouth daily. (Patient not taking: Reported on 12/30/2015)   Not Taking at Unknown time  . citalopram (CELEXA) 10 MG tablet Take 1 tablet (10 mg total) by mouth daily. (Patient not taking: Reported on 12/30/2015) 14 tablet 0 Not Taking at Unknown time  . clindamycin (CLEOCIN) 300 MG capsule Take 1 capsule (300 mg total) by mouth 3 (three) times daily. (Patient not taking: Reported on 12/30/2015) 30  capsule 0 Not Taking at Unknown time  . clopidogrel (PLAVIX) 75 MG tablet Take 1 tablet (75 mg total) by mouth daily. (Patient not taking: Reported on 12/30/2015)   Not Taking at Unknown time  . esomeprazole (NEXIUM) 40 MG capsule Take 1 capsule (40 mg total) by mouth 2 (two) times daily before a meal. (Patient not taking: Reported on 12/30/2015)   Not Taking at Unknown time  . fluticasone (FLONASE) 50 MCG/ACT nasal spray Place 2 sprays into both nostrils daily as needed for allergies or rhinitis.   Not Taking at Unknown time  . fluticasone (FLOVENT HFA) 44 MCG/ACT inhaler Inhale 2 puffs into the lungs 2 (two) times daily. (Patient not taking: Reported on 12/30/2015) 1 Inhaler 12 Not Taking at Unknown time  . furosemide (LASIX) 40 MG tablet Take 40 mg by mouth daily as needed for fluid.   Not Taking at Unknown time  . hydrochlorothiazide (HYDRODIURIL) 25 MG tablet Take 1 tablet (25 mg total) by mouth daily. (Patient not taking: Reported on 12/30/2015) 30 tablet 0 Not Taking at Unknown time  . hydrOXYzine (ATARAX/VISTARIL) 25 MG tablet Take 25 mg by mouth every 6 (six) hours.   Not Taking at Unknown time  . ipratropium (ATROVENT) 0.02 % nebulizer solution Take 0.5 mg by nebulization every 6 (six) hours as needed for wheezing or shortness of breath.   Not Taking at Unknown time  . levETIRAcetam (KEPPRA) 500 MG tablet Take 1 tablet (500 mg total) by mouth every 12 (twelve) hours. 60 tablet 0 Unknown  . metFORMIN (GLUCOPHAGE) 500 MG tablet Take 1 tablet (500 mg total) by mouth daily with breakfast. 30 tablet 0 Unknown  . metoprolol tartrate (LOPRESSOR) 25 MG tablet Take 0.5 tablets (12.5 mg total) by mouth 2 (two) times daily. (Patient not taking: Reported on 12/30/2015) 60 tablet 0 Not Taking at Unknown time  . potassium chloride (K-DUR,KLOR-CON) 10 MEQ tablet Take 1 tablet (10 mEq total) by mouth daily. (Patient not taking: Reported on 12/30/2015)   Not Taking at Unknown time  . risperiDONE (RISPERDAL M-TABS) 1  MG disintegrating tablet Take 1 tablet (1 mg total) by mouth 2 (two) times daily. (Patient not taking: Reported on 12/30/2015) 28 tablet 0 Not Taking at Unknown time  . tamsulosin (FLOMAX) 0.4 MG CAPS capsule Take 1 capsule (0.4 mg total) by mouth daily after breakfast. (Patient not taking: Reported on 12/30/2015) 30 capsule  Not Taking at Unknown time  . traZODone (DESYREL) 50 MG tablet Take 1 tablet (50 mg total) by mouth at bedtime as needed for sleep. (Patient not taking: Reported  on 12/30/2015) 30 tablet 0 Not Taking at Unknown time    Patient Stressors: Financial difficulties Health problems Marital or family conflict Medication change or noncompliance Substance abuse  Patient Strengths: Average or above average intelligence Capable of independent living General fund of knowledge  Treatment Modalities: Medication Management, Group therapy, Case management,  1 to 1 session with clinician, Psychoeducation, Recreational therapy.   Physician Treatment Plan for Primary Diagnosis: Cocaine abuse with cocaine-induced mood disorder ([place]) Long Term Goal(s): Improvement in symptoms so as ready for discharge Improvement in symptoms so as ready for discharge   Short Term Goals: Ability to disclose and discuss suicidal ideas Ability to demonstrate self-control will improve Ability to identify changes in lifestyle to reduce recurrence of condition will improve Ability to identify triggers associated with substance abuse/mental health issues will improve  Medication Management: Evaluate patient's response, side effects, and tolerance of medication regimen.  Therapeutic Interventions: 1 to 1 sessions, Unit Group sessions and Medication administration.  Evaluation of Outcomes: Adequate for Discharge to [place] per MD  Physician Treatment Plan for Secondary Diagnosis: Principal Problem:   Cocaine abuse with cocaine-induced mood disorder ([place])  Long Term Goal(s): Improvement in symptoms so as ready  for discharge Improvement in symptoms so as ready for discharge   Short Term Goals: Ability to disclose and discuss suicidal ideas Ability to demonstrate self-control will improve Ability to identify changes in lifestyle to reduce recurrence of condition will improve Ability to identify triggers associated with substance abuse/mental health issues will improve     Medication Management: Evaluate patient's response, side effects, and tolerance of medication regimen.  Therapeutic Interventions: 1 to 1 sessions, Unit Group sessions and Medication administration.  Evaluation of Outcomes: Adequate for Discharge to [place] per MD   RN Treatment Plan for Primary Diagnosis: Cocaine abuse with cocaine-induced mood disorder ([place]) Long Term Goal(s): Knowledge of disease and therapeutic regimen to maintain health will improve  Short Term Goals: Ability to remain free from injury will improve, Ability to disclose and discuss suicidal ideas, Ability to identify and develop effective coping behaviors will improve and Compliance with prescribed medications will improve  Medication Management: RN will administer medications as ordered by provider, will assess and evaluate patient's response and provide education to patient for prescribed medication. RN will report any adverse and/or side effects to prescribing provider.  Therapeutic Interventions: 1 on 1 counseling sessions, Psychoeducation, Medication administration, Evaluate responses to treatment, Monitor vital signs and CBGs as ordered, Perform/monitor CIWA, COWS, AIMS and Fall Risk screenings as ordered, Perform wound care treatments as ordered.  Evaluation of Outcomes: Adequate for Discharge to [place] per MD   LCSW Treatment Plan for Primary Diagnosis: Cocaine abuse with cocaine-induced mood disorder ([place]) Long Term Goal(s): Safe transition to appropriate next level of care at discharge, Engage patient in therapeutic group addressing interpersonal  concerns.  Short Term Goals: Engage patient in aftercare planning with referrals and resources, Increase social support, Increase emotional regulation, Identify triggers associated with mental health/substance abuse issues and Increase skills for wellness and recovery  Therapeutic Interventions: Assess for all discharge needs, 1 to 1 time with Social worker, Explore available resources and support systems, Assess for adequacy in community support network, Educate family and significant other(s) on suicide prevention, Complete Psychosocial Assessment, Interpersonal group therapy.  Evaluation of Outcomes: Adequate for Discharge to [place] per MD   Progress in Treatment :  Attending groups:Yes  Participating in groups: Yes  Taking medication as prescribed: Yes, MD continuing to assess for appropriate  medication regimen  Toleration medication: Yes  Family/Significant other contact made: No, patient has declined for collateral contact   Patient understands diagnosis: Yes  Discussing patient identified problems/goals with staff: Yes  Medical problems stabilized or resolved: Yes  Denies suicidal/homicidal ideation: Patient endorses passive SI  Issues/concerns per patient self-inventory: None reported  Other: N/A  New problem(s) identified: None reported at this time    New Short Term/Long Term Goal(s): None at this time    Discharge Plan or Barriers: Patient has been accepted to [facility] for 01/07/16    Reason for Continuation of Hospitalization: Anxiety Depression Medication stabilization Suicidal Ideations Withdrawal symptoms  Estimated Length of Stay: Discharge anticipated for 01/07/16    Attendees:  Patient:  Physician: Dr. Sharolyn Douglas, Dr. Parke Poisson, MD 01/05/2016 9:30am  Nursing: Kerby Nora, [place] Ormond, [place], [place] RN11/13/2017 9:30am  RN Care Manager: Lars Pinks, [place] 01/05/16 9:30am  Social Workers: Peri Maris, LCSW, Erasmo Downer  [place], LCSW, [place], LCSW 01/05/2016 9:30am  Nurse Pratictioners: Ricky Ala, NP, Catalina Pizza, NP 01/05/2016 9:30am  Other:   Scribe for Treatment Team: Tilden Fossa, [place] Worker [facility] [phone]

## 2016-01-05 NOTE — BHH Group Notes (Signed)
[place] LCSW Group Therapy 01/05/2016  1:15 PM   Type of Therapy: Group Therapy  Participation Level: Did Not Participate, he left at the beginning of group and did not return.    Tilden Fossa, MSW, [place] Clinical Social Worker [facility] [phone]

## 2016-01-05 NOTE — Progress Notes (Signed)
CSW spoke with Juliann Pulse at [facility] who reports that referral continues to be under review at this time. CSW will continue to follow.  Tilden Fossa, LCSW Clinical Social Worker [facility] [phone]

## 2016-01-05 NOTE — Progress Notes (Signed)
Psychoeducational Group Note  Date:  01/04/2016 Time:  2100  Group Topic/Focus:  wrap up group  Participation Level: Did Not Attend  Participation Quality:  Not Applicable  Affect:  Not Applicable  Cognitive:  Not Applicable  Insight:  Not Applicable  Engagement in Group: Not Applicable  Additional Comments:  Pt was notified that group was beginning but returned to his room reporting that he had a bad day and wasn't participating in any groups .   Dalton [name] S 01/05/2016, 12:28 AM

## 2016-01-05 NOTE — Plan of Care (Addendum)
Problem: Coping: Goal: Ability to cope will improve Outcome: Not Progressing Nurse tried to discuss anxiety/depression/coping skills with patient.

## 2016-01-05 NOTE — Progress Notes (Signed)
Patient has continued to refuse all VS, CBG's, medications throughout the day.  Staff aware of patient's behavior.
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
## 2016-01-05 NOTE — Plan of Care (Signed)
Problem: Education: Goal: Ability to make informed decisions regarding treatment will improve Outcome: Not Progressing Nurse attempted to talk to patient about suicidal thoughts/coping skills.

## 2016-01-05 NOTE — Progress Notes (Addendum)
CSW was informed that patient had contacted admissions coordinator Shayla at [facility] on Sunday 11/12 and made suicidal threats after being denied admission due to medical acuity.   CSW faxed updated clinicals to [facility] and left message for admissions coordinator Juliann Pulse for update on status of referral. CSW awaiting return call.  Tilden Fossa, LCSW Clinical Social Worker [facility] [phone]

## 2016-01-05 NOTE — Progress Notes (Signed)
CSW updated care coordinator Zeb Comfort [phone] on disposition plans.   Tilden Fossa, LCSW Clinical Social Worker [facility] [phone]

## 2016-01-05 NOTE — Progress Notes (Signed)
D:  Patient wrote on his self inventory sheet, DEATH IS AN OPTION.   Patient refused CBG, VS and all medications this morning.   A:  Emotional support and encouragement given patient. R:  Safety maintained with 15 minute checks. SW informed of patient's behavior and written remark on self inventory sheet.

## 2016-01-06 LAB — GLUCOSE, CAPILLARY: GLUCOSE-CAPILLARY: 116 mg/dL — AB (ref 65–99)

## 2016-01-06 MED ORDER — ATORVASTATIN CALCIUM 10 MG PO TABS: 10.0000 mg | ORAL_TABLET | Freq: Every day | ORAL | 0 refills | Status: DC

## 2016-01-06 MED ORDER — RISPERIDONE 1 MG PO TBDP: 1.0000 mg | ORAL_TABLET | Freq: Two times a day (BID) | ORAL | 0 refills | Status: DC

## 2016-01-06 MED ORDER — POTASSIUM CHLORIDE CRYS ER 10 MEQ PO TBCR: 10.0000 meq | EXTENDED_RELEASE_TABLET | Freq: Every day | ORAL | 0 refills | Status: DC

## 2016-01-06 MED ORDER — TAMSULOSIN HCL 0.4 MG PO CAPS: 0.4000 mg | ORAL_CAPSULE | Freq: Every day | ORAL | 0 refills | Status: AC

## 2016-01-06 MED ORDER — METOPROLOL TARTRATE 25 MG PO TABS: 12.5000 mg | ORAL_TABLET | Freq: Two times a day (BID) | ORAL | 0 refills | Status: DC

## 2016-01-06 MED ORDER — TRAZODONE HCL 50 MG PO TABS: 50.0000 mg | ORAL_TABLET | Freq: Every evening | ORAL | 0 refills | Status: DC | PRN

## 2016-01-06 MED ORDER — METFORMIN HCL 500 MG PO TABS: 500.0000 mg | ORAL_TABLET | Freq: Every day | ORAL | 0 refills | Status: DC

## 2016-01-06 MED ORDER — CLOPIDOGREL BISULFATE 75 MG PO TABS: 75.0000 mg | ORAL_TABLET | Freq: Every day | ORAL | 0 refills | Status: DC

## 2016-01-06 MED ORDER — CITALOPRAM HYDROBROMIDE 20 MG PO TABS: 20.0000 mg | ORAL_TABLET | Freq: Every day | ORAL | 0 refills | Status: DC

## 2016-01-06 MED ORDER — HYDROCHLOROTHIAZIDE 25 MG PO TABS: 25.0000 mg | ORAL_TABLET | Freq: Every day | ORAL | 0 refills | Status: DC

## 2016-01-06 MED ORDER — PANTOPRAZOLE SODIUM 40 MG PO TBEC: 40.0000 mg | DELAYED_RELEASE_TABLET | Freq: Every day | ORAL | 0 refills | Status: DC

## 2016-01-06 MED ORDER — LEVETIRACETAM 500 MG PO TABS: 500.0000 mg | ORAL_TABLET | Freq: Two times a day (BID) | ORAL | 0 refills | Status: DC

## 2016-01-06 NOTE — Progress Notes (Signed)
Recreation Therapy Notes  Animal-Assisted Activity (AAA) Program Checklist/Progress Notes Patient Eligibility Criteria Checklist & Daily Group note for Rec TxIntervention  Date: 11.14.2017 Time: 2:45pm Location: [address]  AAA/T Program Assumption of Risk Form signed by Patient/ or Parent Legal Guardian Yes  Patient is free of allergies or sever asthma Yes  Patient reports no fear of animals Yes  Patient reports no history of cruelty to animals Yes  Patient understands his/her participation is voluntary Yes  Behavioral Response: Did not attend.   Laureen Ochs Dalton [name], LRT/CTRS        Lane Hacker 01/06/2016 2:59 PM

## 2016-01-06 NOTE — BHH Group Notes (Signed)
[place] LCSW Group Therapy  01/06/2016   1:15 PM   Type of Therapy:  Group Therapy  Participation Level:  Active  Participation Quality:  Attentive, Sharing and Supportive  Affect:  Appropriate  Cognitive:  Alert and Oriented  Insight:  Developing/Improving and Engaged  Engagement in Therapy:  Developing/Improving and Engaged  Modes of Intervention:  Clarification, Confrontation, Discussion, Education, Exploration, Limit-setting, Orientation, Problem-solving, Rapport Building, Art therapist, Socialization and Support  Summary of Progress/Problems: The topic for group therapy was feelings about diagnosis.  Pt actively participated in group discussion on their past and current diagnosis and how they feel towards this.  Pt also identified how society and family members judge them, based on their diagnosis as well as stereotypes and stigmas.  Patient engaged in therapeutic writing activity, expressing motivation to overcome his addiction and feeling more hopeful towards his recovery now that he has been accepted to [facility].   Tilden Fossa, MSW, [place] Clinical Social Worker [facility] [phone]
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
## 2016-01-06 NOTE — Progress Notes (Signed)
Patient attended AA group meeting tonight.  
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
## 2016-01-06 NOTE — Progress Notes (Signed)
  [facility] Adult Case Management Discharge Plan :  Will you be returning to the same living situation after discharge:  No. Patient to be transferred to [facility] for continued treatment At discharge, do you have transportation home?: Yes,  sheriff to transport Do you have the ability to pay for your medications: Yes,  patient will be provided prescriptions at discharge  Release of information consent forms completed and in the chart;  Patient's signature needed at discharge.  Patient to Follow up at: Follow-up Information    [facility] Follow up on 01/07/2016.   Why:  Please go on this date for further treatment.  Contact information: [place] [phone]          Next level of care provider has access to [place] and Suicide Prevention discussed: Yes,  with patient  Have you used any form of tobacco in the last 30 days? (Cigarettes, Smokeless Tobacco, Cigars, and/or Pipes): No  Has patient been referred to the Quitline?: N/A patient is not a smoker  Patient has been referred for addiction treatment: Yes  Dalton [name] Dalton [name] 01/06/2016, 3:50 PM
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
## 2016-01-06 NOTE — Progress Notes (Addendum)
Patient's self inventory sheet, patient signed in large letters "I DON'T Dalton [name]'.  Patient refused VS and CBG this morning, also refused all medications.  MD talked with patient who did agree to take his morning meds before lunch.  VS and CBG were taken before lunch.  Patient has attended groups this morning.  Patient has been in better spirits today after he talked with MD. Emotional support and encouragement given patient. Safety maintained with 15 minute checks.

## 2016-01-06 NOTE — Progress Notes (Signed)
Patient agreed to VS this morning.  Patient refused CBG and refused all meds this morning. Patient continues to sit in dayroom, talking, laughing with peers.  Respirations even and unlabored.  No signs/symptoms of pain/distress noted on patient's face/body movements.  Safety maintained with 15 minute checks.  Patient's note on self inventory sheet this morning, wrote in large letters " I DON'T [place]."

## 2016-01-06 NOTE — Progress Notes (Addendum)
Patient accepted to [facility] per Juliann Pulse and Dr. Ileene Patrick.  CSW informed treatment team.  IVC paperwork completed and faxed to Magistrate due to patient's suicidal ideations. Magistrate confirmed receipt of paperwork. Originals placed on patient shadow chart.  CSW contacted Corporal Pinrod at [place]. To schedule transport for early morning on 11/15.   CSW spoke with patient regarding acceptance and IVC. He verbalized his understanding and has no further questions at this time. He is agreeable to further treatment at [facility] at this time. CSW encouraged him to consider further treatment programs or a recovery house at discharge from [facility]. CSW provided patient with listing of recovery houses in [place] area.   Tilden Fossa, LCSW Clinical Social Worker [facility] [phone]

## 2016-01-06 NOTE — Progress Notes (Signed)
D: Pt  Passive SI- contracts for safety denies /HI/AVH. Pt is pleasant and cooperative. Pt stated he was upset that [facility] wouldn't take him. Pt has been refusing to take his medications and get his blood sugars taken. Writer spoke with pt and pt stated he would start back taking his medications tomorrow. Pt said he was upset due to feeling nothing matters anymore.   A: Pt was offered support and encouragement. Pt was given scheduled medications. Pt was encourage to attend groups. Q 15 minute checks were done for safety.   R:Pt attends groups and interacts well with peers and staff. Pt is taking medication. Pt receptive to treatment and safety maintained on unit.

## 2016-01-06 NOTE — Progress Notes (Signed)
D: Pt denies SI/HI/AVH. Pt is pleasant and cooperative. Pt stated he was feeling much better today. Pt presents with much insight into his Tx. Pt plans to go to [facility] on D/C  A: Pt was offered support and encouragement. Pt was given scheduled medications. Pt was encourage to attend groups. Q 15 minute checks were done for safety.    R:Pt attends groups and interacts well with peers and staff. Pt is taking medication. Pt has no complaints.Pt receptive to treatment and safety maintained on unit.

## 2016-01-06 NOTE — [facility] Group Notes (Signed)
The focus of this group is to educate the patient on the purpose and policies of crisis stabilization and provide a format to answer questions about their admission.  The group details unit policies and expectations of patients while admitted.  Patient attended 0900 nurse education orientation group this morning.  Patient listened attentively and had appropriate affect.  Patient was alert.  Patient did not comment on goals or coping skills.  Today patient will work on  3 goals for discharge.

## 2016-01-06 NOTE — Plan of Care (Signed)
Problem: Coping: Goal: Ability to cope will improve Outcome: Progressing Nurse discussed depression/coping skills with patient.

## 2016-01-06 NOTE — Progress Notes (Signed)
Patient's papers delivered by sheriff and put on front of chart.

## 2016-01-07 LAB — GLUCOSE, CAPILLARY
Glucose-Capillary: 137 mg/dL — ABNORMAL HIGH (ref 65–99)
Glucose-Capillary: 117 mg/dL — ABNORMAL HIGH (ref 65–99)

## 2016-01-07 NOTE — Progress Notes (Signed)
Nursing Progress Note: 7a-7p D: Pt currently presents with a animated/happy/upbeat/pleasant behavior. Pt reports to writer that their goal is to "get discharged." Pt states "I am ready to get my life back on a better track than it has ever been." Pt reports fair sleep with current medication regimen.   A: Pt provided with medications per providers orders. Pt's labs and vitals were monitored throughout the day to discharge. Pt supported emotionally and encouraged to express concerns and questions. Pt educated on medications.  R: Pt's safety ensured with 15 minute and environmental checks. Pt currently denies SI/HI/Self Harm and A/V hallucinations. Pt verbally agrees to seek staff if SI/HI or A/VH occurs and to consult with staff before acting on any harmful thoughts. Pt educated on his discharge plan and waits for transportation.

## 2016-01-07 NOTE — Progress Notes (Signed)
Recreation Therapy Notes  Date: 01/07/16 Time: 0930 Location: 300 Hall Dayroom  Group Topic: Stress Management  Goal Area(s) Addresses:  Patient will verbalize importance of using healthy stress management.  Patient will identify positive emotions associated with healthy stress management.   Behavioral Response: Engaged  Intervention: Calm App  Activity :  Resilience Meditation.  LRT introduced the stress management technique of meditation.  LRT played a meditation to help patients improve resilience.  Patients were to follow along with the recording to the best of their ability to engaged in the technique.  Education:  Stress Management, Discharge Planning.   Education Outcome: Acknowledges edcuation/In group clarification offered/Needs additional education  Clinical Observations/Feedback: Pt attended group.    Victorino Sparrow, LRT/CTRS         Victorino Sparrow A 01/07/2016 12:01 PM

## 2016-01-07 NOTE — Progress Notes (Signed)
Patient verbalizes readiness for discharge. Follow up plan explained, Rx's given and all belongings returned. Patient verbalizes understanding. Denies SI/HI and assures this Probation officer he will seek assistance should that change. Patient discharged ambulatory and in stable condition to sheriff deputies who are transporting to [facility].

## 2016-01-28 ENCOUNTER — Emergency Department (HOSPITAL_COMMUNITY): Payer: Medicare Other

## 2016-01-28 ENCOUNTER — Emergency Department (HOSPITAL_COMMUNITY)
Admission: EM | Admit: 2016-01-28 | Discharge: 2016-01-28 | Disposition: A | Discharge status: Home / Self Care | Payer: Medicare Other | Attending: Emergency Medicine | Admitting: Emergency Medicine

## 2016-01-28 ENCOUNTER — Encounter (HOSPITAL_COMMUNITY): Payer: Self-pay | Admitting: Emergency Medicine

## 2016-01-28 DIAGNOSIS — R0602 Shortness of breath: Secondary | ICD-10-CM | POA: Diagnosis present

## 2016-01-28 DIAGNOSIS — I2510 Atherosclerotic heart disease of native coronary artery without angina pectoris: Secondary | ICD-10-CM | POA: Insufficient documentation

## 2016-01-28 DIAGNOSIS — R0789 Other chest pain: Secondary | ICD-10-CM | POA: Diagnosis not present

## 2016-01-28 DIAGNOSIS — I110 Hypertensive heart disease with heart failure: Secondary | ICD-10-CM | POA: Diagnosis not present

## 2016-01-28 DIAGNOSIS — I509 Heart failure, unspecified: Secondary | ICD-10-CM | POA: Insufficient documentation

## 2016-01-28 DIAGNOSIS — E119 Type 2 diabetes mellitus without complications: Secondary | ICD-10-CM | POA: Insufficient documentation

## 2016-01-28 DIAGNOSIS — Z7984 Long term (current) use of oral hypoglycemic drugs: Secondary | ICD-10-CM | POA: Insufficient documentation

## 2016-01-28 LAB — BASIC METABOLIC PANEL
ANION GAP: 8 (ref 5–15)
BUN: 10 mg/dL (ref 6–20)
CHLORIDE: 106 mmol/L (ref 101–111)
CO2: 26 mmol/L (ref 22–32)
CREATININE: 1.09 mg/dL (ref 0.61–1.24)
Calcium: 9.2 mg/dL (ref 8.9–10.3)
GFR calc non Af Amer: >60 mL/min (ref >60)
Glucose, Bld: 79 mg/dL (ref 65–99)
Potassium: 3.4 mmol/L — ABNORMAL LOW (ref 3.5–5.1)
SODIUM: 140 mmol/L (ref 135–145)

## 2016-01-28 LAB — CBC
HCT: 34.9 % — ABNORMAL LOW (ref 39.0–52.0)
Hemoglobin: 11.7 g/dL — ABNORMAL LOW (ref 13.0–17.0)
MCH: 28.7 pg (ref 26.0–34.0)
MCHC: 33.5 g/dL (ref 30.0–36.0)
MCV: 85.7 fL (ref 78.0–100.0)
Platelets: 197 K/uL (ref 150–400)
RBC: 4.07 MIL/uL — ABNORMAL LOW (ref 4.22–5.81)
RDW: 14.2 % (ref 11.5–15.5)
WBC: 7.1 K/uL (ref 4.0–10.5)

## 2016-01-28 LAB — TROPONIN I: Troponin I: <0.03 ng/mL

## 2016-01-28 MED ORDER — NITROGLYCERIN 0.4 MG SL SUBL
SUBLINGUAL_TABLET | SUBLINGUAL | Status: AC
  Administered 2016-01-28: 0.4 mg via SUBLINGUAL
  Filled 2016-01-28: qty 1

## 2016-01-28 MED ORDER — NITROGLYCERIN 0.4 MG SL SUBL
0.4000 mg | SUBLINGUAL_TABLET | Freq: Once | SUBLINGUAL | Status: AC
  Administered 2016-01-28: 0.4 mg via SUBLINGUAL
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
## 2016-01-28 NOTE — ED Triage Notes (Addendum)
Patient brought in by EMS, states he is staying at the [facility] Patient complaining of left sided chest pain radiating down left arm since yesterday. Per EMS, patient was given 324 mg ASA and one nitro with no relief.

## 2016-01-28 NOTE — ED Notes (Signed)
Social worker came from room and stated patient had removed IV from site and had put his clothes on and stated he was leaving. States "He won't answer my questions. I was trying to get him into a shelter but he got mad and said he was leaving." Went into room and patient was completely dressed with his coat on. Patient rummaging through his bags stating he was looking for something. This nurse asked if she could call patient's son to pick him up and patient states "He's at school, he can't do nothing. I'll be alright." Noticed a small amount of blood on patient's pants leg and asked patient if I could assess his IV site and put a bandaid on it so it would not bleed on him." Patient refuses to answer any further questions and refuses to let this nurse assess IV site. IV needle and extension lying on patient's bed at this time. Patient ambulated to exit with no assistance or difficulty. Patient has several bags he took with him that he brought in at arrival.

## 2016-01-28 NOTE — Clinical Social Work Note (Signed)
CSW met with pt following referral for homelessness. Pt alert and oriented and states he has been staying at the [facility], but ran out of money and has nowhere to stay tonight. He said his only family is his son and he is staying with someone so it is not an option for pt to stay there as well. CSW provided shelter list and suggested that pt call surrounding counties and if he found a shelter, then hospital could provide cab. Pt became upset that CSW did not have somewhere for pt to go besides a shelter and immediately starting ripping off his leads and IV. He refused to answer any questions after that besides saying he was told he was discharged and he was leaving. RN notified and also attempted to speak to pt but he refused and walked out.  Benay Pike, [place]

## 2016-01-29 NOTE — ED Notes (Addendum)
Patient in room #1 found a prescription bottle for Dalton [name] for clopidogrel. Called patient's phone number on file and advised patient medication was here at the hospital. Patient states "I'll try to make arrangements to come and get it." Medication given to [facility] to place in pharmacy to hold. This note was completed on 01/29/2016 at 2209.

## 2016-02-25 ENCOUNTER — Ambulatory Visit (INDEPENDENT_AMBULATORY_CARE_PROVIDER_SITE_OTHER): Payer: Medicare Other | Admitting: Gastroenterology

## 2016-02-25 ENCOUNTER — Other Ambulatory Visit: Payer: Self-pay

## 2016-02-25 ENCOUNTER — Encounter: Payer: Self-pay | Admitting: Gastroenterology

## 2016-02-25 VITALS — BP 145/79 | HR 68 | Temp 98.1°F | Ht 70.0 in | Wt 237.8 lb

## 2016-02-25 DIAGNOSIS — K219 Gastro-esophageal reflux disease without esophagitis: Secondary | ICD-10-CM | POA: Diagnosis not present

## 2016-02-25 DIAGNOSIS — R1319 Other dysphagia: Secondary | ICD-10-CM

## 2016-02-25 DIAGNOSIS — R1310 Dysphagia, unspecified: Secondary | ICD-10-CM

## 2016-02-25 NOTE — Patient Instructions (Signed)
1. Samples of Dexilant provided to take once daily 30 minutes before breakfast. THIS IS TO BE USED IF YOU RUN OUT OF PROTONIX (PANTOPRAZOLE). DO NOT USE BOTH. 2. Upper endoscopy with Dr. Oneida Alar as scheduled. Please see separate instructions.

## 2016-02-25 NOTE — Assessment & Plan Note (Signed)
56 year old gentleman with chronic GERD, history of gastritis, peptic ulcer disease. Treated for H. pylori gastritis couple of years ago. Presents with refractory heartburn, solid food dysphagia, significant nocturnal symptoms. Symptoms have failed to respond to increasing pantoprazole to 2-3 times daily. Intermittent mild anemia noted upon review of labs. Colonoscopy in 2012. Plan for EGD with dilation in the near future with deep sedation given polypharmacy.  I have discussed the risks, alternatives, benefits with regards to but not limited to the risk of reaction to medication, bleeding, infection, perforation and the patient is agreeable to proceed. Written consent to be obtained.  If no significant findings, patient may require further evaluation of anemia, consideration of updating colonoscopy. Further rectal relation to follow.

## 2016-02-25 NOTE — Progress Notes (Signed)
cc'ed to pcp °

## 2016-02-25 NOTE — Progress Notes (Addendum)
REVIEWED-NO ADDITIONAL RECOMMENDATIONS.  Primary Care Physician:  Philis Fendt, MD  Primary Gastroenterologist:  Barney Drain, MD   Chief Complaint  Patient presents with  . Dysphagia    feels like everything gets stuck in throat, worse when lying down at night    HPI:  Dalton [name] is a 56 y.o. male here for further evaluation of dysphagia. He was last seen in August 2016. Patient has a history of remote peptic ulcer disease, last EGD April 2014 with mild nonerosive gastritis and biopsy showing positive H. pylori. Patient underwent H. pylori treatment.  Patient states he was doing well on Nexium as far as his reflux symptoms but he had to be switched to pantoprazole due to insurance reasons. He is prescribed one daily. For the past several months he's had difficulty with controlling typical reflux. Now having nocturnal symptoms as well. Several month history of difficulty swallowing solid foods with episodes of impaction requiring vomiting for relief. He increased his Protonix to 2-3 times daily on his own. He has not been able to get any relief from his reflux. Frequent heartburn and regurgitation. Has to sleep sitting up at nighttime. Some epigastric pain. Bowel function normal. No blood in stool or melena. 2-3 protonix daily, heartburn. Dysphagia, solid. No pill dysphagia. Nocturnal. Little abd pain, like need BM. BM regular. No melena, brbpr. No weight loss.     Current Outpatient Prescriptions  Medication Sig Dispense Refill  . atorvastatin (LIPITOR) 10 MG tablet Take 1 tablet (10 mg total) by mouth daily at 6 PM. 30 tablet 0  . cetirizine (ZYRTEC) 10 MG tablet Take 10 mg by mouth daily.  0  . citalopram (CELEXA) 20 MG tablet Take 1 tablet (20 mg total) by mouth daily. 30 tablet 0  . clopidogrel (PLAVIX) 75 MG tablet Take 1 tablet (75 mg total) by mouth daily. 30 tablet 0  . hydrochlorothiazide (HYDRODIURIL) 25 MG tablet Take 1 tablet (25 mg total) by mouth daily. 30 tablet 0   . levETIRAcetam (KEPPRA) 500 MG tablet Take 1 tablet (500 mg total) by mouth every 12 (twelve) hours. 30 tablet 0  . LYRICA 100 MG capsule Take 100 mg by mouth 2 (two) times daily.  0  . metFORMIN (GLUCOPHAGE) 500 MG tablet Take 1 tablet (500 mg total) by mouth daily with breakfast. (Patient taking differently: Take 500 mg by mouth 2 (two) times daily with a meal. ) 30 tablet 0  . metoprolol tartrate (LOPRESSOR) 25 MG tablet Take 0.5 tablets (12.5 mg total) by mouth 2 (two) times daily. 30 tablet 0  . pantoprazole (PROTONIX) 40 MG tablet Take 1 tablet (40 mg total) by mouth daily. (Patient taking differently: Take 40 mg by mouth 2 (two) times daily. ) 30 tablet 0  . potassium chloride (K-DUR,KLOR-CON) 10 MEQ tablet Take 1 tablet (10 mEq total) by mouth daily. 30 tablet 0  . risperiDONE (RISPERDAL M-TABS) 1 MG disintegrating tablet Take 1 tablet (1 mg total) by mouth 2 (two) times daily. 60 tablet 0  . tamsulosin (FLOMAX) 0.4 MG CAPS capsule Take 1 capsule (0.4 mg total) by mouth daily after breakfast. 30 capsule 0  . traZODone (DESYREL) 50 MG tablet Take 1 tablet (50 mg total) by mouth at bedtime and may repeat dose one time if needed. 30 tablet 0   No current facility-administered medications for this visit.     Allergies as of 02/25/2016 - Review Complete 02/25/2016  Allergen Reaction Noted  . Gabapentin Hives   .  Ibuprofen Other (See Comments)   . Zolpidem tartrate Other (See Comments)   . Naproxen Other (See Comments)     Past Medical History:  Diagnosis Date  . Anemia   . Anxiety   . Bipolar disorder ([place])   . CHF (congestive heart failure) ([place])   . Chronic back pain    [facility] in [place]  . Chronic bronchitis   . Chronic lower back pain   . Congestive heart failure (CHF) ([place])   . Coronary artery disease   . Depression   . DVT (deep venous thrombosis) ([place]) ~ 2005   LLE  . Frequency of urination   . GERD (gastroesophageal reflux disease)   . 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 GQQPYPPJ(093.2)    "a few times/week" (12/24/2015)  . High cholesterol   . Hypertension   . Laceration of right hand 11/27/2010  . Laceration of wrist 2007 BIL FOREARMS  . MI (myocardial infarction)    7, last one was in 2011 (12/24/2015)  . NSAID-induced gastric ulcer    "Ibuprofen"  . PUD (peptic ulcer disease)    in 1990s, secondary to medication  . Rheumatoid arthritis ([place])    "all over" (12/24/2015)  . Shortness of breath    with exertion  . Tonsillitis, chronic    Dr. Vicki Mallet in [place]  . Type II diabetes mellitus ([place])     Past Surgical History:  Procedure Laterality Date  . BACK SURGERY    . BIOPSY N/A 05/30/2012   Procedure: BIOPSY;  Surgeon: Danie Binder, MD;  Location: [facility];  Service: Endoscopy;  Laterality: N/A;  . CARDIAC CATHETERIZATION  "several"  . CARPAL TUNNEL RELEASE Bilateral   . COLONOSCOPY  12/28/2010   [facility]: (MAC)Internal hemorrhoids/four small colon polyps tubular adenomas. per [facility]: colonoscopy 2022  . CORONARY ARTERY BYPASS GRAFT  2002   3 vessels  . ESOPHAGOGASTRODUODENOSCOPY N/A 05/30/2012   SLF: UNCONTROLLED GERD DUE TO LIFESTYLE CHOICE/WEIGHT GAIN/MILD Non-erosive gastritis  . FRACTURE SURGERY    . LEFT HEART CATHETERIZATION WITH CORONARY ANGIOGRAM N/A 08/31/2011   Procedure: LEFT HEART CATHETERIZATION WITH CORONARY ANGIOGRAM;  Surgeon: Laverda Page, MD;  Location: [facility] CATH LAB;  Service: Cardiovascular;  Laterality: N/A;  . LUMBAR DISC SURGERY     "L4-5; Dr. Trenton Gammon"  . ORIF MANDIBULAR FRACTURE N/A 12/19/2015   Procedure: OPEN REDUCTION INTERNAL FIXATION (ORIF) MANDIBULAR FRACTURE;  Surgeon: Izora Gala, MD;  Location: [facility];  Service: ENT;  Laterality: N/A;  . PATELLA FRACTURE SURGERY Left 1976   plate to knee cap from accident  . SAVORY DILATION  12/28/2010   SLF:(MAC)J-shaped stomach/nodular mocosa in the distal esophagus/empiric dilation 42m     Family History  Problem Relation Age of Onset  . Diabetes Mother   . Hypertension Mother   . Heart attack Mother   . Hypertension Father   . Diabetes Father   . Heart attack Father   . Heart attack      mother, father, brother, sister all deceased due to MI  . Heart attack Sister   . Heart attack Brother   . Seizures Brother   . Heart failure Other   . Colon cancer Neg Hx   . Liver disease Neg Hx   . Anesthesia problems Neg Hx   . Hypotension Neg Hx   . Malignant hyperthermia Neg Hx   . Pseudochol deficiency Neg Hx   . Colon  polyps Neg Hx     Social History   Social History  . Marital status: Single    Spouse name: N/A  . Number of children: 2  . Years of education: N/A   Occupational History  . disabled Not Employed   Social History Main Topics  . Smoking status: Never Smoker  . Smokeless tobacco: Never Used  . Alcohol use No     Comment: Pt reports he is not drinking now  . 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 Sexual activity: Not Currently   Other Topics Concern  . Not on file   Social History Narrative   Lives w/ son-23/22      ROS:  General: Negative for anorexia, weight loss, fever, chills, fatigue, weakness. Eyes: Negative for vision changes.  ENT: Negative for hoarseness,nasal congestion.See history of present illness CV: Negative for chest pain, angina, palpitations, dyspnea on exertion, peripheral edema.  Respiratory: Negative for dyspnea at rest, dyspnea on exertion, cough, sputum, wheezing.  GI: See history of present illness. GU:  Negative for dysuria, hematuria, urinary incontinence, urinary frequency, nocturnal urination.  MS: Negative for joint pain, low back pain.  Derm: Negative for rash or itching.  Neuro: Negative for weakness, abnormal sensation, seizure, frequent headaches, memory loss, confusion.  Psych: Negative for anxiety, depression, suicidal ideation,  hallucinations.  Endo: Negative for unusual weight change.  Heme: Negative for bruising or bleeding. Allergy: Negative for rash or hives.    Physical Examination:  BP (!) 145/79   Pulse 68   Temp 98.1 F (36.7 C) (Oral)   Ht _0  (1.778 m)   Wt 237 lb 12.8 oz (107.9 kg)   BMI 34.12 kg/m    General: Well-nourished, well-developed in no acute distress.  Head: Normocephalic, atraumatic.   Eyes: Conjunctiva pink, no icterus. Mouth: Oropharyngeal mucosa moist and pink , no lesions erythema or exudate. Neck: Supple without thyromegaly, masses, or lymphadenopathy.  Lungs: Clear to auscultation bilaterally.  Heart: Regular rate and rhythm, no murmurs rubs or gallops.  Abdomen: Bowel sounds are normal, nontender, nondistended, no hepatosplenomegaly or masses, no abdominal bruits or    hernia , no rebound or guarding.   Rectal: Not performed Extremities: No lower extremity edema. No clubbing or deformities.  Neuro: Alert and oriented x 4 , grossly normal neurologically.  Skin: Warm and dry, no rash or jaundice.   Psych: Alert and cooperative, normal mood and affect.  Labs: Lab Results  Component Value Date   CREATININE 1.09 01/28/2016   BUN 10 01/28/2016   NA 140 01/28/2016   K 3.4 (L) 01/28/2016   CL 106 01/28/2016   CO2 26 01/28/2016   Lab Results  Component Value Date   WBC 7.1 01/28/2016   HGB 11.7 (L) 01/28/2016   HCT 34.9 (L) 01/28/2016   MCV 85.7 01/28/2016   PLT 197 01/28/2016     Imaging Studies: Dg Chest 2 View  Result Date: 01/28/2016 CLINICAL DATA:  Chest pain, hypertension EXAM: CHEST  2 VIEW COMPARISON:  Chest x-ray of 12/24/2015 FINDINGS: No active infiltrate or effusion is seen. Mediastinal and hilar contours are unremarkable. Median sternotomy sutures are noted with the more caudal of these sutures broken. The heart is within upper limits of normal. No bony abnormality is seen. IMPRESSION: No active cardiopulmonary disease. Electronically Signed   By:  Ivar Drape M.D.   On: 01/28/2016 12:59

## 2016-02-27 ENCOUNTER — Encounter (HOSPITAL_COMMUNITY)
Admission: RE | Admit: 2016-02-27 | Discharge: 2016-02-27 | Disposition: A | Discharge status: Home / Self Care | Payer: Medicare Other | Source: Ambulatory Visit | Attending: Gastroenterology | Admitting: Gastroenterology

## 2016-03-02 ENCOUNTER — Ambulatory Visit (HOSPITAL_COMMUNITY): Payer: Medicare Other | Admitting: Anesthesiology

## 2016-03-02 ENCOUNTER — Encounter (HOSPITAL_COMMUNITY): Payer: Self-pay | Admitting: Gastroenterology

## 2016-03-02 ENCOUNTER — Ambulatory Visit (HOSPITAL_COMMUNITY)
Admission: RE | Admit: 2016-03-02 | Discharge: 2016-03-02 | Disposition: A | Discharge status: Home / Self Care | Payer: Medicare Other | Source: Ambulatory Visit | Attending: Gastroenterology | Admitting: Gastroenterology

## 2016-03-02 ENCOUNTER — Encounter (HOSPITAL_COMMUNITY)
Admission: RE | Disposition: A | Discharge status: Home / Self Care | Payer: Self-pay | Source: Ambulatory Visit | Attending: Gastroenterology

## 2016-03-02 DIAGNOSIS — B9681 Helicobacter pylori [H. pylori] as the cause of diseases classified elsewhere: Secondary | ICD-10-CM | POA: Insufficient documentation

## 2016-03-02 DIAGNOSIS — Z951 Presence of aortocoronary bypass graft: Secondary | ICD-10-CM | POA: Insufficient documentation

## 2016-03-02 DIAGNOSIS — Z79899 Other long term (current) drug therapy: Secondary | ICD-10-CM | POA: Diagnosis not present

## 2016-03-02 DIAGNOSIS — E7800 Pure hypercholesterolemia, unspecified: Secondary | ICD-10-CM | POA: Insufficient documentation

## 2016-03-02 DIAGNOSIS — Z955 Presence of coronary angioplasty implant and graft: Secondary | ICD-10-CM | POA: Diagnosis not present

## 2016-03-02 DIAGNOSIS — Z7902 Long term (current) use of antithrombotics/antiplatelets: Secondary | ICD-10-CM | POA: Diagnosis not present

## 2016-03-02 DIAGNOSIS — M069 Rheumatoid arthritis, unspecified: Secondary | ICD-10-CM | POA: Diagnosis not present

## 2016-03-02 DIAGNOSIS — G40409 Other generalized epilepsy and epileptic syndromes, not intractable, without status epilepticus: Secondary | ICD-10-CM | POA: Diagnosis not present

## 2016-03-02 DIAGNOSIS — F319 Bipolar disorder, unspecified: Secondary | ICD-10-CM | POA: Insufficient documentation

## 2016-03-02 DIAGNOSIS — K2950 Unspecified chronic gastritis without bleeding: Secondary | ICD-10-CM | POA: Insufficient documentation

## 2016-03-02 DIAGNOSIS — R1310 Dysphagia, unspecified: Secondary | ICD-10-CM | POA: Insufficient documentation

## 2016-03-02 DIAGNOSIS — F419 Anxiety disorder, unspecified: Secondary | ICD-10-CM | POA: Diagnosis not present

## 2016-03-02 DIAGNOSIS — Z86718 Personal history of other venous thrombosis and embolism: Secondary | ICD-10-CM | POA: Diagnosis not present

## 2016-03-02 DIAGNOSIS — I2510 Atherosclerotic heart disease of native coronary artery without angina pectoris: Secondary | ICD-10-CM | POA: Insufficient documentation

## 2016-03-02 DIAGNOSIS — K2970 Gastritis, unspecified, without bleeding: Secondary | ICD-10-CM | POA: Diagnosis not present

## 2016-03-02 DIAGNOSIS — K219 Gastro-esophageal reflux disease without esophagitis: Secondary | ICD-10-CM | POA: Diagnosis not present

## 2016-03-02 DIAGNOSIS — I252 Old myocardial infarction: Secondary | ICD-10-CM | POA: Insufficient documentation

## 2016-03-02 DIAGNOSIS — E119 Type 2 diabetes mellitus without complications: Secondary | ICD-10-CM | POA: Diagnosis not present

## 2016-03-02 DIAGNOSIS — I10 Essential (primary) hypertension: Secondary | ICD-10-CM | POA: Diagnosis not present

## 2016-03-02 DIAGNOSIS — Z7984 Long term (current) use of oral hypoglycemic drugs: Secondary | ICD-10-CM | POA: Insufficient documentation

## 2016-03-02 HISTORY — DX: Cough, unspecified: R05.9

## 2016-03-02 HISTORY — PX: BIOPSY: SHX5522

## 2016-03-02 HISTORY — PX: SAVORY DILATION: SHX5439

## 2016-03-02 HISTORY — PX: ESOPHAGOGASTRODUODENOSCOPY (EGD) WITH PROPOFOL: SHX5813

## 2016-03-02 HISTORY — DX: Cough: R05

## 2016-03-02 LAB — GLUCOSE, CAPILLARY
GLUCOSE-CAPILLARY: 114 mg/dL — AB (ref 65–99)
GLUCOSE-CAPILLARY: 72 mg/dL (ref 65–99)

## 2016-03-02 SURGERY — ESOPHAGOGASTRODUODENOSCOPY (EGD) WITH PROPOFOL
Anesthesia: Monitor Anesthesia Care

## 2016-03-02 MED ORDER — PANTOPRAZOLE SODIUM 40 MG PO TBEC: 40.0000 mg | DELAYED_RELEASE_TABLET | Freq: Two times a day (BID) | ORAL | 11 refills | Status: DC

## 2016-03-02 MED ORDER — FENTANYL CITRATE (PF) 100 MCG/2ML IJ SOLN
25.0000 ug | INTRAMUSCULAR | Status: AC | PRN
Start: 2016-03-02 — End: 2016-03-02
  Administered 2016-03-02 (×2): 25 ug via INTRAVENOUS

## 2016-03-02 MED ORDER — LACTATED RINGERS IV SOLN
INTRAVENOUS | Status: DC
  Administered 2016-03-02: 08:00:00 via INTRAVENOUS

## 2016-03-02 MED ORDER — MIDAZOLAM HCL 2 MG/2ML IJ SOLN
INTRAMUSCULAR | Status: AC
  Filled 2016-03-02: qty 2

## 2016-03-02 MED ORDER — LIDOCAINE VISCOUS 2 % MT SOLN
OROMUCOSAL | Status: AC
  Filled 2016-03-02: qty 15

## 2016-03-02 MED ORDER — MIDAZOLAM HCL 2 MG/2ML IJ SOLN
1.0000 mg | INTRAMUSCULAR | Status: DC | PRN
  Administered 2016-03-02: 2 mg via INTRAVENOUS

## 2016-03-02 MED ORDER — PROPOFOL 10 MG/ML IV BOLUS
INTRAVENOUS | Status: DC | PRN
  Administered 2016-03-02: 20 mg via INTRAVENOUS

## 2016-03-02 MED ORDER — PROPOFOL 500 MG/50ML IV EMUL
INTRAVENOUS | Status: DC | PRN
  Administered 2016-03-02: 125 ug/kg/min via INTRAVENOUS

## 2016-03-02 MED ORDER — LIDOCAINE VISCOUS 2 % MT SOLN: 5.0000 mL | Freq: Two times a day (BID) | OROMUCOSAL | Status: DC

## 2016-03-02 MED ORDER — PROPOFOL 10 MG/ML IV BOLUS
INTRAVENOUS | Status: AC
  Filled 2016-03-02: qty 40

## 2016-03-02 MED ORDER — MINERAL OIL PO OIL
TOPICAL_OIL | ORAL | Status: AC
  Filled 2016-03-02: qty 30

## 2016-03-02 MED ORDER — FENTANYL CITRATE (PF) 100 MCG/2ML IJ SOLN
INTRAMUSCULAR | Status: AC
  Filled 2016-03-02: qty 2

## 2016-03-02 NOTE — Op Note (Signed)
[facility] Patient Name: Dalton [name] Procedure Date: 03/02/2016 8:07 AM MRN: [id number] Date of Birth: Aug 08, 1960 Attending MD: Barney Drain , MD CSN: [id number] Age: 56 Admit Type: Outpatient Procedure:                Upper GI endoscopy WITH COLD FORCEPS                            BIOPSY/ESOPHAGEAL DILATION Indications:              Dysphagia-last EGD 2014: H PYLORI GASTRITIS. TAKING                            DEXILANT SAMPLES OR PROTONIX. 2013: BARIUM                            SWALLOW-GERD, NL ESOPHAGEAL MOTILITY Providers:                Barney Drain, MD, [place] B. Sharon Seller, RN, [place] N[place].                            [place], [employer] Referring MD:             Tarry Kos. Avbuere MD Medicines:                Propofol per Anesthesia Complications:            No immediate complications. Estimated Blood Loss:     Estimated blood loss was minimal. Procedure:                Pre-Anesthesia Assessment:                           - Prior to the procedure, a History and Physical                            was performed, and patient medications and                            allergies were reviewed. The patient's tolerance of                            previous anesthesia was also reviewed. The risks                            and benefits of the procedure and the sedation                            options and risks were discussed with the patient.                            All questions were answered, and informed consent                            was obtained. Prior Anticoagulants: The patient has  taken Plavix (clopidogrel), last dose was 1 day                            prior to procedure. ASA Grade Assessment: II - A                            patient with mild systemic disease. After reviewing                            the risks and benefits, the patient was deemed in                            satisfactory condition to undergo the procedure.                    After obtaining informed consent, the endoscope was                            passed under direct vision. Throughout the                            procedure, the patient's blood pressure, pulse, and                            oxygen saturations were monitored continuously. The                            [id number]) scope was introduced through the                            mouth, and advanced to the second part of duodenum.                            The upper GI endoscopy was accomplished without                            difficulty. The patient tolerated the procedure                            well. Scope In: 8:42:17 AM Scope Out: 8:50:06 AM Total Procedure Duration: 0 hours 7 minutes 49 seconds  Findings:      The examined esophagus was normal. EXAM MAY HAVE MISSED PROXIMAL       ESOPHAGEAL WEB. A guidewire was placed and the scope was withdrawn.       EMPIRIC Dilation was performed with a Savary dilator with no resistance       at 16 mm and 17 mm. Estimated blood loss: none.      Patchy mild inflammation characterized by congestion (edema) and       erythema was found in the entire examined stomach. Biopsies were taken       with a cold forceps for Helicobacter pylori testing.      The examined duodenum was normal. Impression:               - DYSPHAGIA MOST LIKELY DUE TO UNCONTROLLED REFLUX  OR ESOPHAGEAL MOTILITY DISORDER                           - MILD Gastritis. Moderate Sedation:      Per Anesthesia Care Recommendation:           - Low fat diet. LOSE 30 LBS. AVOID REFLUX TRIGGERS.                           - Await pathology results.                           - Return to my office in 3 months. IF PT FAILS                            MEDICAL MANAGEMENT, CONSIDER REFERRAL TO [facility]                            FOR DYSPHAGIA/ESOPHAGEAL MANOMETRY.                           - Patient has a contact number available for                             emergencies. The signs and symptoms of potential                            delayed complications were discussed with the                            patient. Return to normal activities tomorrow.                            Written discharge instructions were provided to the                            patient.                           - Continue present medications. Procedure Code(s):        --- Professional ---                           [phone], Esophagogastroduodenoscopy, flexible,                            transoral; with insertion of guide wire followed by                            passage of dilator(s) through esophagus over guide                            wire                           [id number], Esophagogastroduodenoscopy, flexible,  transoral; with biopsy, single or multiple Diagnosis Code(s):        --- Professional ---                           K29.70, Gastritis, unspecified, without bleeding                           R13.10, Dysphagia, unspecified CPT copyright 2016 [employer]. All rights reserved. The codes documented in this report are preliminary and upon coder review may  be revised to meet current compliance requirements. Barney Drain, MD Barney Drain, MD 03/02/2016 9:07:25 AM This report has been signed electronically. Number of Addenda: 0

## 2016-03-02 NOTE — Discharge Instructions (Signed)
I dilated your esophagus. I DID NOT SEE A DEFINITE NARROWING IN YOUR esophagus. You have MILD Gastritis. I biopsied your stomach.   DRINK WATER TO KEEP YOUR URINE LIGHT YELLOW.  CONTINUE YOUR WEIGHT LOSS EFFORTS. LOSE 20 POUNDS.  FOLLOW A LOW FAT DIET. MEATS SHOULD BE BAKED, BROILED, OR BOILED. AVOID FRIED FOODS.  CONTINUE PROTONIX. TAKE 30 MINUTES PRIOR TO MEALS TWICE DAILY.  YOUR BIOPSY RESULTS WILL BE AVAILABLE IN MY CHART AFTER JAN 12 AND MY OFFICE WILL CONTACT YOU IN 10-14 DAYS WITH YOUR RESULTS.   FOLLOW UP IN 3 MOS.  UPPER ENDOSCOPY AFTER CARE Read the instructions outlined below and refer to this sheet in the next week. These discharge instructions provide you with general information on caring for yourself after you leave the hospital. While your treatment has been planned according to the most current medical practices available, unavoidable complications occasionally occur. If you have any problems or questions after discharge, call DR. FIELDS, [phone].  ACTIVITY  You may resume your regular activity, but move at a slower pace for the next 24 hours.   Take frequent rest periods for the next 24 hours.   Walking will help get rid of the air and reduce the bloated feeling in your belly (abdomen).   No driving for 24 hours (because of the medicine (anesthesia) used during the test).   You may shower.   Do not sign any important legal documents or operate any machinery for 24 hours (because of the anesthesia used during the test).    NUTRITION  Drink plenty of fluids.   You may resume your normal diet as instructed by your doctor.   Begin with a light meal and progress to your normal diet. Heavy or fried foods are harder to digest and may make you feel sick to your stomach (nauseated).   Avoid alcoholic beverages for 24 hours or as instructed.    MEDICATIONS  You may resume your normal medications.   WHAT YOU CAN EXPECT TODAY  Some feelings of bloating  in the abdomen.   Passage of more gas than usual.    IF YOU HAD A BIOPSY TAKEN DURING THE UPPER ENDOSCOPY:  Eat a soft diet IF YOU HAVE NAUSEA, BLOATING, ABDOMINAL PAIN, OR VOMITING.    FINDING OUT THE RESULTS OF YOUR TEST Not all test results are available during your visit. DR. Oneida Alar WILL CALL YOU WITHIN 14 DAYS OF YOUR PROCEDUE WITH YOUR RESULTS. Do not assume everything is normal if you have not heard from DR. FIELDS, CALL HER OFFICE AT [phone].  SEEK IMMEDIATE MEDICAL ATTENTION AND CALL THE OFFICE: [phone] IF:  You have more than a spotting of blood in your stool.   Your belly is swollen (abdominal distention).   You are nauseated or vomiting.   You have a temperature over 101F.   You have abdominal pain or discomfort that is severe or gets worse throughout the day.    Lifestyle and home remedies TO HELP CONTROL HEARTBURN AND REFLUX.  You may eliminate or reduce the frequency of heartburn by making the following lifestyle changes:   Control your weight. Being overweight is a major risk factor for heartburn and GERD. Excess pounds put pressure on your abdomen, pushing up your stomach and causing acid to back up into your esophagus.    Eat smaller meals. 4 TO 6 MEALS A DAY. This reduces pressure on the lower esophageal sphincter, helping to prevent the valve from opening and acid from washing back  into your esophagus.    Loosen your belt. Clothes that fit tightly around your waist put pressure on your abdomen and the lower esophageal sphincter.     Eliminate heartburn triggers. Everyone has specific triggers. Common triggers such as fatty or fried foods, spicy food, tomato sauce, carbonated beverages, alcohol, chocolate, mint, garlic, onion, caffeine and nicotine may make heartburn worse.    Avoid stooping or bending. Tying your shoes is OK. Bending over for longer periods to weed your garden isn't, especially soon after eating.    Don't lie down after a  meal. Wait at least three to four hours after eating before going to bed, and don't lie down right after eating.    PLACE THE HEAD OF YOUR BED ON 6 INCH BLOCKS.  Alternative medicine  Several home remedies exist for treating GERD, but they provide only temporary relief. They include drinking baking soda (sodium bicarbonate) added to water or drinking other fluids such as baking soda mixed with cream of tartar and water.  Although these liquids create temporary relief by neutralizing, washing away or buffering acids, eventually they aggravate the situation by adding gas and fluid to your stomach, increasing pressure and causing more acid reflux. Further, adding more sodium to your diet may increase your blood pressure and add stress to your heart, and excessive bicarbonate ingestion can alter the acid-base balance in your body.   Gastritis  Gastritis is an inflammation (the body's way of reacting to injury and/or infection) of the stomach. It is often caused by viral or bacterial (germ) infections. It can also be caused BY ASPIRIN, BC/GOODY POWDER'S, (IBUPROFEN) MOTRIN, OR ALEVE (NAPROXEN), chemicals (including alcohol), SPICY FOODS, and medications. This illness may be associated with generalized malaise (feeling tired, not well), UPPER ABDOMINAL STOMACH cramps, and fever. One common bacterial cause of gastritis is an organism known as H. Pylori. This can be treated with antibiotics.    DYSPHAGIA DYSPHAGIA can be caused by stomach acid backing up into the tube that carries food from the mouth down to the stomach (lower esophagus).  TREATMENT There are a number of medicines used to treat DYSPHAGIA including: Antacids.  Proton-pump inhibitors: PROTONIX OR DEXILANT ZANTAC OR PEPCID   Heart-Healthy Eating Plan Many factors influence your heart health, including eating and exercise habits. Heart (coronary) risk increases with abnormal blood fat (lipid) levels. Heart-healthy meal planning  includes limiting unhealthy fats, increasing healthy fats, and making other small dietary changes. This includes maintaining a healthy body weight to help keep lipid levels within a normal range. What is my plan? Your health care provider recommends that you:  Get no more than _________% of the total calories in your daily diet from fat.  Limit your intake of saturated fat to less than _________% of your total calories each day.  Limit the amount of cholesterol in your diet to less than _________ mg per day. What types of fat should I choose?  Choose healthy fats more often. Choose monounsaturated and polyunsaturated fats, such as olive oil and canola oil, flaxseeds, walnuts, almonds, and seeds.  Eat more omega-3 fats. Good choices include salmon, mackerel, sardines, tuna, flaxseed oil, and ground flaxseeds. Aim to eat fish at least two times each week.  Limit saturated fats. Saturated fats are primarily found in animal products, such as meats, butter, and cream. Plant sources of saturated fats include palm oil, palm kernel oil, and coconut oil.  Avoid foods with partially hydrogenated oils in them. These contain trans fats. Examples of  foods that contain trans fats are stick margarine, some tub margarines, cookies, crackers, and other baked goods. What general guidelines do I need to follow?  Check food labels carefully to identify foods with trans fats or high amounts of saturated fat.  Fill one half of your plate with vegetables and green salads. Eat 4-5 servings of vegetables per day. A serving of vegetables equals 1 cup of raw leafy vegetables,  cup of raw or cooked cut-up vegetables, or  cup of vegetable juice.  Fill one fourth of your plate with whole grains. Look for the word "whole" as the first word in the ingredient list.  Fill one fourth of your plate with lean protein foods.  Eat 4-5 servings of fruit per day. A serving of fruit equals one medium whole fruit,  cup of dried  fruit,  cup of fresh, frozen, or canned fruit, or  cup of 100% fruit juice.  Eat more foods that contain soluble fiber. Examples of foods that contain this type of fiber are apples, broccoli, carrots, beans, peas, and barley. Aim to get 20-30 g of fiber per day.  Eat more home-cooked food and less restaurant, buffet, and fast food.  Limit or avoid alcohol.  Limit foods that are high in starch and sugar.  Avoid fried foods.  Cook foods by using methods other than frying. Baking, boiling, grilling, and broiling are all great options. Other fat-reducing suggestions include:  Removing the skin from poultry.  Removing all visible fats from meats.  Skimming the fat off of stews, soups, and gravies before serving them.  Steaming vegetables in water or broth.  Lose weight if you are overweight. Losing just 5-10% of your initial body weight can help your overall health and prevent diseases such as diabetes and heart disease.  Increase your consumption of nuts, legumes, and seeds to 4-5 servings per week. One serving of dried beans or legumes equals  cup after being cooked, one serving of nuts equals 1 ounces, and one serving of seeds equals  ounce or 1 tablespoon.  You may need to monitor your salt (sodium) intake, especially if you have high blood pressure. Talk with your health care provider or dietitian to get more information about reducing sodium. What foods can I eat? Grains  Breads, including [place], white, pita, wheat, raisin, rye, oatmeal, and [place]. Tortillas that are neither fried nor made with lard or trans fat. Low-fat rolls, including hotdog and hamburger buns and English muffins. Biscuits. Muffins. Waffles. Pancakes. Light popcorn. Whole-grain cereals. Flatbread. Melba toast. Pretzels. Breadsticks. Rusks. Low-fat snacks and crackers, including oyster, saltine, matzo, graham, animal, and rye. Rice and pasta, including brown rice and those that are made with whole  wheat. Vegetables  All vegetables. Fruits  All fruits, but limit coconut. Meats and Other Protein Sources  Lean, well-trimmed beef, veal, pork, and lamb. Chicken and [place] without skin. All fish and shellfish. Wild duck, rabbit, pheasant, and venison. Egg whites or low-cholesterol egg substitutes. Dried beans, peas, lentils, and tofu.Seeds and most nuts. Dairy  Low-fat or nonfat cheeses, including ricotta, string, and mozzarella. Skim or 1% milk that is liquid, powdered, or evaporated. Buttermilk that is made with low-fat milk. Nonfat or low-fat yogurt. Beverages  Mineral water. Diet carbonated beverages. Sweets and Desserts  Sherbets and fruit ices. Honey, jam, marmalade, jelly, and syrups. Meringues and gelatins. Pure sugar candy, such as hard candy, jelly beans, gumdrops, mints, marshmallows, and small amounts of dark chocolate. [employer] Eat all sweets and  desserts in moderation. Fats and Oils  Nonhydrogenated (trans-free) margarines. Vegetable oils, including soybean, sesame, sunflower, olive, peanut, safflower, corn, canola, and cottonseed. Salad dressings or mayonnaise that are made with a vegetable oil. Limit added fats and oils that you use for cooking, baking, salads, and as spreads. Other  Cocoa powder. Coffee and tea. All seasonings and condiments. The items listed above may not be a complete list of recommended foods or beverages. Contact your dietitian for more options.  What foods are not recommended? Grains  Breads that are made with saturated or trans fats, oils, or whole milk. Croissants. Butter rolls. Cheese breads. Sweet rolls. Donuts. Buttered popcorn. Chow mein noodles. High-fat crackers, such as cheese or butter crackers. Meats and Other Protein Sources  Fatty meats, such as hotdogs, short ribs, sausage, spareribs, bacon, ribeye roast or steak, and mutton. High-fat deli meats, such as salami and bologna. Caviar. Domestic duck and goose. Organ meats, such as  kidney, liver, sweetbreads, brains, gizzard, chitterlings, and heart. Dairy  Cream, sour cream, cream cheese, and creamed cottage cheese. Whole milk cheeses, including blue (bleu), Monterey Jack, [place], [place], American, [place], Swiss, [place], [place], and [place]. Whole or 2% milk that is liquid, evaporated, or condensed. Whole buttermilk. Cream sauce or high-fat cheese sauce. Yogurt that is made from whole milk. Beverages  Regular sodas and drinks with added sugar. Sweets and Desserts  Frosting. Pudding. Cookies. Cakes other than angel food cake. Candy that has milk chocolate or white chocolate, hydrogenated fat, butter, coconut, or unknown ingredients. Buttered syrups. Full-fat ice cream or ice cream drinks. Fats and Oils  Gravy that has suet, meat fat, or shortening. Cocoa butter, hydrogenated oils, palm oil, coconut oil, palm kernel oil. These can often be found in baked products, candy, fried foods, nondairy creamers, and whipped toppings. Solid fats and shortenings, including bacon fat, salt pork, lard, and butter. Nondairy cream substitutes, such as coffee creamers and sour cream substitutes. Salad dressings that are made of unknown oils, cheese, or sour cream. The items listed above may not be a complete list of foods and beverages to avoid. Contact your dietitian for more information.  This information is not intended to replace advice given to you by your health care provider. Make sure you discuss any questions you have with your health care provider. Document Released: 11/18/2007 Document Revised: 08/29/2015 Document Reviewed: 08/02/2013 Elsevier Interactive Patient Education  2017 [employer].

## 2016-03-02 NOTE — Transfer of Care (Signed)
Immediate Anesthesia Transfer of Care Note  Patient: Dalton [name]  Procedure(s) Performed: Procedure(s) with comments: ESOPHAGOGASTRODUODENOSCOPY (EGD) WITH PROPOFOL (N/A) - 815 SAVORY DILATION (N/A) BIOPSY - gastric  Patient Location: PACU  Anesthesia Type:MAC  Level of Consciousness: awake and alert   Airway & Oxygen Therapy: Patient Spontanous Breathing and Patient connected to nasal cannula oxygen  Post-op Assessment: Report given to RN  Post vital signs: Reviewed and stable  Last Vitals:  Vitals:   03/02/16 0825 03/02/16 0830  BP: 106/70   Resp: 17 14    Last Pain:  Vitals:   03/02/16 0834  PainSc: 9          Complications: No apparent anesthesia complications

## 2016-03-02 NOTE — H&P (Signed)
Primary Care Physician:  Philis Fendt, MD Primary Gastroenterologist:  Dr. Oneida Alar  Pre-Procedure History & Physical: HPI:  Dalton [name] is a 56 y.o. male here for [place].  Past Medical History:  Diagnosis Date  . Anemia   . Anxiety   . Bipolar disorder ([place])   . CHF (congestive heart failure) ([place])   . Chronic back pain    Pain Clinic in [place]  . Chronic bronchitis   . Chronic lower back pain   . Congestive heart failure (CHF) ([place])   . Coronary artery disease   . Coughing   . Depression   . DVT (deep venous thrombosis) ([place]) ~ 2005   LLE  . Frequency of urination   . GERD (gastroesophageal reflux disease)   . 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 XAJOINOM(767.2)    "a few times/week" (12/24/2015)  . High cholesterol   . Hypertension   . Laceration of right hand 11/27/2010  . Laceration of wrist 2007 BIL FOREARMS  . MI (myocardial infarction)    7, last one was in 2011 (12/24/2015)  . NSAID-induced gastric ulcer    "Ibuprofen"  . PUD (peptic ulcer disease)    in 1990s, secondary to medication  . Rheumatoid arthritis ([place])    "all over" (12/24/2015)  . Shortness of breath    with exertion  . Tonsillitis, chronic    Dr. Vicki Mallet in [place]  . Type II diabetes mellitus ([place])     Past Surgical History:  Procedure Laterality Date  . BACK SURGERY    . BIOPSY N/A 05/30/2012   Procedure: BIOPSY;  Surgeon: Danie Binder, MD;  Location: [facility];  Service: Endoscopy;  Laterality: N/A;  . CARDIAC CATHETERIZATION  "several"  . CARPAL TUNNEL RELEASE Bilateral   . COLONOSCOPY  12/28/2010   SLF: (MAC)Internal hemorrhoids/four small colon polyps tubular adenomas. per SLF: colonoscopy 2022  . CORONARY ARTERY BYPASS GRAFT  2002   3 vessels  . ESOPHAGOGASTRODUODENOSCOPY N/A 05/30/2012   SLF: UNCONTROLLED GERD DUE TO LIFESTYLE CHOICE/WEIGHT GAIN/MILD Non-erosive gastritis  . FRACTURE  SURGERY    . LEFT HEART CATHETERIZATION WITH CORONARY ANGIOGRAM N/A 08/31/2011   Procedure: LEFT HEART CATHETERIZATION WITH CORONARY ANGIOGRAM;  Surgeon: Laverda Page, MD;  [facility] C[facility] LAB;  Service: Cardiovascular;  Laterality: N/A;  . LUMBAR DISC SURGERY     "L4-5; Dr. Trenton Gammon"  . ORIF MANDIBULAR FRACTURE N/A 12/19/2015   Procedure: OPEN REDUCTION INTERNAL FIXATION (ORIF) MANDIBULAR FRACTURE;  Surgeon: Izora Gala, MD;  Location: [place];  Service: ENT;  Laterality: N/A;  . PATELLA FRACTURE SURGERY Left 1976   plate to knee cap from accident  . SAVORY DILATION  12/28/2010   SLF:(MAC)J-shaped stomach/nodular mocosa in the distal esophagus/empiric dilation 55m    Prior to Admission medications   Medication Sig Start Date End Date Taking? Authorizing Provider  albuterol (PROVENTIL HFA;VENTOLIN HFA) 108 (90 Base) MCG/ACT inhaler Inhale 1-2 puffs into the lungs every 6 (six) hours as needed for wheezing or shortness of breath.   Yes Historical Provider, MD  alprazolam (Duanne Moron 2 MG tablet Take 2 mg by mouth 2 (two) times daily.   Yes Historical Provider, MD  atorvastatin (LIPITOR) 10 MG tablet Take 1 tablet (10 mg total) by mouth daily at 6 PM. 01/06/16  Yes SKerrie Buffalo NP  cetirizine (ZYRTEC) 10 MG tablet Take 10 mg by mouth daily. 01/26/16  Yes Historical Provider, MD  citalopram (CELEXA) 20 MG tablet Take 1 tablet (20 mg total) by mouth daily. 01/07/16  Yes Kerrie Buffalo, NP  clopidogrel (PLAVIX) 75 MG tablet Take 1 tablet (75 mg total) by mouth daily. 01/07/16  Yes Kerrie Buffalo, NP  hydrochlorothiazide (HYDRODIURIL) 25 MG tablet Take 1 tablet (25 mg total) by mouth daily. 01/07/16  Yes Kerrie Buffalo, NP  ipratropium (ATROVENT) 0.02 % nebulizer solution Take 0.5 mg by nebulization 2 (two) times daily.   Yes Historical Provider, MD  ipratropium (ATROVENT) 0.02 % nebulizer solution Take 0.5 mg by nebulization 2 (two) times daily as needed (for congestion).   Yes Historical Provider,  MD  levETIRAcetam (KEPPRA) 500 MG tablet Take 1 tablet (500 mg total) by mouth every 12 (twelve) hours. 01/06/16  Yes Kerrie Buffalo, NP  LYRICA 100 MG capsule Take 100 mg by mouth 2 (two) times daily. 01/05/16  Yes Historical Provider, MD  metFORMIN (GLUCOPHAGE) 500 MG tablet Take 1 tablet (500 mg total) by mouth daily with breakfast. Patient taking differently: Take 500 mg by mouth 2 (two) times daily with a meal.  01/07/16  Yes Kerrie Buffalo, NP  metoprolol tartrate (LOPRESSOR) 25 MG tablet Take 0.5 tablets (12.5 mg total) by mouth 2 (two) times daily. 01/06/16  Yes Kerrie Buffalo, NP  oxyCODONE-acetaminophen (PERCOCET) 10-325 MG tablet Take 1 tablet by mouth 5 (five) times daily as needed for pain.   Yes Historical Provider, MD  pantoprazole (PROTONIX) 40 MG tablet Take 1 tablet (40 mg total) by mouth daily. Patient taking differently: Take 40 mg by mouth 2 (two) times daily.  01/07/16  Yes Kerrie Buffalo, NP  potassium chloride (K-DUR,KLOR-CON) 10 MEQ tablet Take 1 tablet (10 mEq total) by mouth daily. 01/07/16  Yes Kerrie Buffalo, NP  risperiDONE (RISPERDAL M-TABS) 1 MG disintegrating tablet Take 1 tablet (1 mg total) by mouth 2 (two) times daily. 01/06/16  Yes Kerrie Buffalo, NP  tamsulosin (FLOMAX) 0.4 MG CAPS capsule Take 1 capsule (0.4 mg total) by mouth daily after breakfast. 01/07/16  Yes Kerrie Buffalo, NP  traZODone (DESYREL) 50 MG tablet Take 1 tablet (50 mg total) by mouth at bedtime and may repeat dose one time if needed. 01/06/16  Yes Kerrie Buffalo, NP    Allergies as of 02/25/2016 - Review Complete 02/25/2016  Allergen Reaction Noted  . Gabapentin Hives   . Ibuprofen Other (See Comments)   . Zolpidem tartrate Other (See Comments)   . Naproxen Other (See Comments)     Family History  Problem Relation Age of Onset  . Diabetes Mother   . Hypertension Mother   . Heart attack Mother   . Hypertension Father   . Diabetes Father   . Heart attack Father   . Heart attack       mother, father, brother, sister all deceased due to MI  . Heart attack Sister   . Heart attack Brother   . Seizures Brother   . Heart failure Other   . Colon cancer Neg Hx   . Liver disease Neg Hx   . Anesthesia problems Neg Hx   . Hypotension Neg Hx   . Malignant hyperthermia Neg Hx   . Pseudochol deficiency Neg Hx   . Colon polyps Neg Hx     Social History   Social History  . Marital status: Single    Spouse name: N/A  . Number of children: 2  . Years of education: N/A   Occupational History  . disabled Not Employed   Social History  Main Topics  . Smoking status: Never Smoker  . Smokeless tobacco: Never Used  . Alcohol use No     Comment: Pt reports he is not drinking now  . 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 Sexual activity: Not Currently   Other Topics Concern  . Not on file   Social History Narrative   Lives w/ son-23/22    Review of Systems: See HPI, otherwise negative ROS   Physical Exam: There were no vitals taken for this visit. General:   Alert,  pleasant and cooperative in NAD Head:  Normocephalic and atraumatic. Neck:  Supple; Lungs:  Clear throughout to auscultation.    Heart:  Regular rate and rhythm. Abdomen:  Soft, nontender and nondistended. Normal bowel sounds, without guarding, and without rebound.   Neurologic:  Alert and  oriented x4;  grossly normal neurologically.  Impression/Plan:     DYSPHAGIA  PLAN:  EGD/possible DIL TODAY. DISCUSSED PROCEDURE, BENEFITS, & RISKS: < 1% chance of medication reaction, bleeding, or perforation.

## 2016-03-02 NOTE — Anesthesia Postprocedure Evaluation (Signed)
Anesthesia Post Note  Patient: Dalton [name]  Procedure(s) Performed: Procedure(s) (LRB): ESOPHAGOGASTRODUODENOSCOPY (EGD) WITH PROPOFOL (N/A) SAVORY DILATION (N/A) BIOPSY  Patient location during evaluation: PACU Anesthesia Type: MAC Level of consciousness: awake and alert and oriented Pain management: pain level controlled Vital Signs Assessment: post-procedure vital signs reviewed and stable Respiratory status: spontaneous breathing Cardiovascular status: blood pressure returned to baseline Postop Assessment: no signs of nausea or vomiting Anesthetic complications: no     Last Vitals:  Vitals:   03/02/16 0825 03/02/16 0830  BP: 106/70   Resp: 17 14    Last Pain:  Vitals:   03/02/16 0834  PainSc: 9                  Chase Arnall

## 2016-03-02 NOTE — Progress Notes (Signed)
   03/02/16 0816  OBSTRUCTIVE SLEEP APNEA  Have you ever been diagnosed with sleep apnea through a sleep study? No  Do you snore loudly (loud enough to be heard through closed doors)?  0  Do you often feel tired, fatigued, or sleepy during the daytime (such as falling asleep during driving or talking to someone)? 0  Has anyone observed you stop breathing during your sleep? 0  Do you have, or are you being treated for high blood pressure? 1  BMI more than 35 kg/m2? 0  Age > 50 (1-yes) 1  Neck circumference greater than:Male 16 inches or larger, Male 17inches or larger? 1  Male Gender (Yes=1) 1  Obstructive Sleep Apnea Score 4

## 2016-03-02 NOTE — Anesthesia Preprocedure Evaluation (Signed)
Anesthesia Evaluation  Patient identified by MRN, date of birth, ID band Patient awake    Reviewed: Allergy & Precautions, H&P , NPO status , Patient's Chart, lab work & pertinent test results, reviewed documented beta blocker date and time   History of Anesthesia Complications Negative for: history of anesthetic complications  Airway Mallampati: III   Neck ROM: Full    Dental  (+) Edentulous Upper, Edentulous Lower   Pulmonary neg pulmonary ROS, shortness of breath,    Pulmonary exam normal        Cardiovascular hypertension, Pt. on medications (-) angina+ CAD (no angina recently), + Past MI, + Cardiac Stents, + CABG and +CHF   Rhythm:Regular Rate:Normal     Neuro/Psych  Headaches, Seizures -, Well Controlled,  PSYCHIATRIC DISORDERS Anxiety Depression Bipolar Disorder    GI/Hepatic PUD, GERD  Medicated and Controlled,  Endo/Other  diabetes  Renal/GU      Musculoskeletal  (+) Arthritis  (chronic LBP),   Abdominal   Peds  Hematology   Anesthesia Other Findings   Reproductive/Obstetrics                             Anesthesia Physical Anesthesia Plan  ASA: III  Anesthesia Plan: MAC   Post-op Pain Management:    Induction: Intravenous  Airway Management Planned: Simple Face Mask  Additional Equipment:   Intra-op Plan:   Post-operative Plan:   Informed Consent: I have reviewed the patients History and Physical, chart, labs and discussed the procedure including the risks, benefits and alternatives for the proposed anesthesia with the patient or authorized representative who has indicated his/her understanding and acceptance.     Plan Discussed with:   Anesthesia Plan Comments:         Anesthesia Quick Evaluation

## 2016-03-06 ENCOUNTER — Telehealth: Payer: Self-pay | Admitting: Gastroenterology

## 2016-03-06 MED ORDER — AMOXICILLIN 500 MG PO TABS: ORAL_TABLET | ORAL | 0 refills | Status: DC

## 2016-03-06 MED ORDER — BIS SUBCIT-METRONID-TETRACYC 140-125-125 MG PO CAPS: ORAL_CAPSULE | ORAL | 0 refills | Status: DC

## 2016-03-06 NOTE — Telephone Encounter (Signed)
PLEASE CALL PT. He has H. Pylori gastritis. HE needs PYLERA 3 PILLS QID FOR 10 DAYS. CONTINUE DEXILANT DAILY OR YOU MUST TAKE PROTONIX TWICE DAILY WHILE TAKING THE ABX TO GET RID OF THE BACTERIA. TAKE ALL THE ABX AS PRESCRIBED. H PYLORI IF LEFT UNTREATED CAN LEAD TO STOMACH CANCER.  The meds can cause nausea, vomiting, abd cramps, loose stools, black colored stools, and metallic taste in HIS mouth.    DRINK WATER TO KEEP YOUR URINE LIGHT YELLOW. CONTINUE YOUR WEIGHT LOSS EFFORTS. LOSE 20 POUNDS. FOLLOW A LOW FAT DIET. MEATS SHOULD BE BAKED, BROILED, OR BOILED. AVOID FRIED FOODS.  FOLLOW UP IN 3 MOS E30 DYSPHAGIA, GERD, H PYLORI GASTRITIS.

## 2016-03-08 NOTE — Telephone Encounter (Signed)
Is aware. Prev Pak is preferred. The only way insurance will pay for Pylera is if pt is allergic to PCN. PT said he is not allergic to PCN. Dr. Oneida Alar, please advise!

## 2016-03-08 NOTE — Telephone Encounter (Signed)
Julie is working on this.

## 2016-03-08 NOTE — Telephone Encounter (Signed)
PLEASE CALL PT'S INSURANCE. PT CANNOT TAKE BIAXIN DUE TO DRUG INTERACTIONS WITH HIS CURRENT MEDS: CELEXA AND ATORVASTATIN.

## 2016-03-08 NOTE — Telephone Encounter (Signed)
PATIENT SCHEDULE FOR FOLLOW UP

## 2016-03-15 NOTE — Telephone Encounter (Signed)
PT called back and I told him that Almyra Free is still working on the medication. He said his stomach is really bothering him now. I have one sample of the Pylera and I am giving it to him with instructions to take Omeprazole bid for the 10 days. He is aware to get the Omeprazole OTC.

## 2016-03-16 NOTE — Telephone Encounter (Signed)
REVIEWED-NO ADDITIONAL RECOMMENDATIONS. 

## 2016-03-24 ENCOUNTER — Encounter (HOSPITAL_COMMUNITY): Payer: Self-pay | Admitting: Gastroenterology

## 2016-04-07 NOTE — Progress Notes (Addendum)
New Outpatient Visit Date: 04/08/2016  Primary Care Provider: Philis Fendt, MD [address] [place] [address]  Chief Complaint: Shortness of breath and chest pain  HPI:  Mr. Dia is a 56 y.o. year-old male with history of CAD status post single vessel CABG (LIMA to LAD; most recent cath showed normal coronaries with atretic LIMA) and multiple prior MI's, cardiomyopathy, DVT, hypertension, hyperlipidemia, type 2 diabetes mellitus, rheumatoid arthritis, peptic ulcer disese, bipolar disorder, and cocaine use, who has been referred by Dr. Jeanie Cooks for evaluation of chest pain and shortness of breath. He reports a 1 year history of exertional dyspnea when climbing a flight of stairs or walking up an incline. It seems to be progressing gradually. He also notes occsassional PND but no orthopnea. He reports dependent leg edema, which has not been present for the last few days. He has been losing weight (15-20 pounds) through a combination of diet and exercise. He walks most days of the week and is trying to enroll in the [employer] program. However, he is currently living in a shelter and is awaiting a more permanent residence before moving forward with this.  Mr. Buerkle also notes intermittent sharp chest pain. The pain happens at least 3-4 days per week and can occur both with rest and exertion. This has the sensation of being punched chest with a maximal intensity 10/10. It typically subsides on its own over the course of 10-15 minutes and is sometimes associated with shortness of breath. He was admitted for similar chest pain in 12/2015 to [facility]. Workup at that time, including myocardial perfusion stress test and an echocardiogram was unrevealing with the exception of grade 2 diastolic dysfunction. He left against medical advice before final cardiology recommendations could be provided. He notes that his single vessel CABG was performed in 2000 in the setting of severe chest  pain and syncope. He has remained on dual antiplatelet therapy with aspirin and clopidogrel for many years, though it does not appear that he has never had a coronary stent. He had a left subclavian artery stent placed several years ago, which was patent on his last catheterization in 2013. Of note, he recently completed therapy for H. Pylori after mild gastritis was noted by EGD last month.  Patient also notes occasional palpitations, described as brief fluttering in the chest. They do not coinice with the aforementioned chest pain or exertional dyspnea. Most recent EKG in 01/2016 showed PACs.  --------------------------------------------------------------------------------------------------  Cardiovascular History & Procedures: Cardiovascular Problems:  Coronary artery disease s/p single-vessel CABG (9935)  Diastolic heart failure  Risk Factors:  Known CAD, hypertension, hyperlipidemia, obesity, male gender, and history of substance abuse  Cath/PCI:  LHC (08/31/11, Dr. Einar Gip): LMCA normal. LAD normal. LAD normal. RCA tortuous but otherwise normal. LVEF 55-60%. LVEDP 19 mmHg. Patent left subclavian artery stent with atretic LIMA.  LHC (10/24/08): LMCA normal. LAD with midvessel bridge and 40% stenosis in systole. Ostial LAD with 30% stenosis. LCx with mild luminal irregularities. RCA with mild luminal irregularities. Patent left subclavian artery stent. LIMA -> LAD proximally occluded.  CV Surgery:  CABG (2000): LIMA -> LAD  EP Procedures and Devices:  None  Non-Invasive Evaluation(s):  Pharmacologic myocardial perfusion stress test (12/25/15): Low risk study without ischemia or scar. LVEF 54% with normal wall motion.  TTE (11/14/15): Normal LV size and systolic function (EF 70-17%). Normal wall motion. Grade 2 diastolic dysfunction. Aortic sclerosis without stenosis. Normal RV size and function.  Recent CV Pertinent Labs:  Lab Results  Component Value Date   CHOL 204 (H) 12/16/2015    HDL 32 (L) 12/16/2015   LDLCALC 103 (H) 12/16/2015   TRIG 343 (H) 12/16/2015   CHOLHDL 6.4 12/16/2015   INR 1.08 12/10/2015   K 3.4 (L) 01/28/2016   MG 1.8 11/16/2015   BUN 10 01/28/2016   CREATININE 1.09 01/28/2016    --------------------------------------------------------------------------------------------------  Past Medical History:  Diagnosis Date  . Anemia   . Anxiety   . Bipolar disorder ([place])   . CHF (congestive heart failure) ([place])   . Chronic back pain    Pain Clinic in [place]  . Chronic bronchitis   . Chronic lower back pain   . Congestive heart failure (CHF) ([place])   . Coronary artery disease   . Coughing   . Depression   . DVT (deep venous thrombosis) ([place]) ~ 2005   LLE  . Frequency of urination   . GERD (gastroesophageal reflux disease)   . 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 ZTIWPYKD(983.3)    "a few times/week" (12/24/2015)  . High cholesterol   . Hypertension   . Laceration of right hand 11/27/2010  . Laceration of wrist 2007 BIL FOREARMS  . MI (myocardial infarction)    7, last one was in 2011 (12/24/2015)  . NSAID-induced gastric ulcer    "Ibuprofen"  . PUD (peptic ulcer disease)    in 1990s, secondary to medication  . Rheumatoid arthritis ([place])    "all over" (12/24/2015)  . Shortness of breath    with exertion  . Tonsillitis, chronic    Dr. Vicki Mallet in [employer]  . Type II diabetes mellitus ([place])     Past Surgical History:  Procedure Laterality Date  . BACK SURGERY    . BIOPSY N/A 05/30/2012   Procedure: BIOPSY;  Surgeon: Danie Binder, MD;  Location: [facility] ORS;  Service: Endoscopy;  Laterality: N/A;  . BIOPSY  03/02/2016   Procedure: BIOPSY;  Surgeon: Danie Binder, MD;  Location: [facility] ENDO SUITE;  Service: Endoscopy;;  gastric  . CARDIAC CATHETERIZATION  "several"  . CARPAL TUNNEL RELEASE Bilateral   . COLONOSCOPY  12/28/2010   [facility]: (MAC)Internal  hemorrhoids/four small colon polyps tubular adenomas. per [facility]: colonoscopy 2022  . CORONARY ARTERY BYPASS GRAFT  2002   3 vessels  . ESOPHAGOGASTRODUODENOSCOPY N/A 05/30/2012   [facility]: UNCONTROLLED GERD DUE TO LIFESTYLE CHOICE/WEIGHT GAIN/MILD Non-erosive gastritis  . ESOPHAGOGASTRODUODENOSCOPY (EGD) WITH PROPOFOL N/A 03/02/2016   Procedure: ESOPHAGOGASTRODUODENOSCOPY (EGD) WITH PROPOFOL;  Surgeon: Danie Binder, MD;  Location: [facility] END[facility] SU[facility];  Service: Endoscopy;  Laterality: N/A;  815  . FRACTURE SURGERY    . LEFT HEART CATHETERIZATION WITH CORONARY ANGIOGRAM N/A 08/31/2011   Procedure: LEFT HEART CATHETERIZATION WITH CORONARY ANGIOGRAM;  Surgeon: Laverda Page, MD;  Location: [facility] CATH LAB;  Service: Cardiovascular;  Laterality: N/A;  . LUMBAR DISC SURGERY     "L4-5; Dr. Trenton Gammon"  . ORIF MANDIBULAR FRACTURE N/A 12/19/2015   Procedure: OPEN REDUCTION INTERNAL FIXATION (ORIF) MANDIBULAR FRACTURE;  Surgeon: Izora Gala, MD;  Location: [place];  Service: ENT;  Laterality: N/A;  . PATELLA FRACTURE SURGERY Left 1976   plate to knee cap from accident  . SAVORY DILATION  12/28/2010   [facility]:(MAC)J-shaped stomach/nodular mocosa in the distal esophagus/empiric dilation 21m  . SAVORY DILATION N/A 03/02/2016   Procedure: SAVORY DILATION;  Surgeon: SDanie Binder MD;  Location: [facility];  Service: Endoscopy;  Laterality: N/A;    Outpatient Encounter Prescriptions as of 04/08/2016  Medication Sig  . albuterol (PROVENTIL HFA;VENTOLIN HFA) 108 (90 Base) MCG/ACT inhaler Inhale 1-2 puffs into the lungs every 6 (six) hours as needed for wheezing or shortness of breath.  . alprazolam (XANAX) 2 MG tablet Take 2 mg by mouth 2 (two) times daily.  [name] Kitchen amoxicillin (AMOXIL) 500 MG tablet 2 PO BID FOR 10 DAYS  . atorvastatin (LIPITOR) 10 MG tablet Take 1 tablet (10 mg total) by mouth daily at 6 PM.  . bismuth-metronidazole-tetracycline (PYLERA) 140-125-125 MG capsule 3 PO QID FOR 10 DAYS  . cetirizine (ZYRTEC) 10 MG tablet  Take 10 mg by mouth daily.  . citalopram (CELEXA) 20 MG tablet Take 1 tablet (20 mg total) by mouth daily.  . clopidogrel (PLAVIX) 75 MG tablet Take 1 tablet (75 mg total) by mouth daily.  . hydrochlorothiazide (HYDRODIURIL) 25 MG tablet Take 1 tablet (25 mg total) by mouth daily.  [name] Kitchen ipratropium (ATROVENT) 0.02 % nebulizer solution Take 0.5 mg by nebulization 2 (two) times daily.  [name] Kitchen ipratropium (ATROVENT) 0.02 % nebulizer solution Take 0.5 mg by nebulization 2 (two) times daily as needed (for congestion).  [name] Kitchen levETIRAcetam (KEPPRA) 500 MG tablet Take 1 tablet (500 mg total) by mouth every 12 (twelve) hours.  [name] Kitchen LYRICA 100 MG capsule Take 100 mg by mouth 2 (two) times daily.  . metFORMIN (GLUCOPHAGE) 500 MG tablet Take 1 tablet (500 mg total) by mouth daily with breakfast. (Patient taking differently: Take 500 mg by mouth 2 (two) times daily with a meal. )  . metoprolol tartrate (LOPRESSOR) 25 MG tablet Take 0.5 tablets (12.5 mg total) by mouth 2 (two) times daily.  [name] Kitchen oxyCODONE-acetaminophen (PERCOCET) 10-325 MG tablet Take 1 tablet by mouth 5 (five) times daily as needed for pain.  . pantoprazole (PROTONIX) 40 MG tablet Take 1 tablet (40 mg total) by mouth 2 (two) times daily.  . potassium chloride (K-DUR,KLOR-CON) 10 MEQ tablet Take 1 tablet (10 mEq total) by mouth daily.  . risperiDONE (RISPERDAL M-TABS) 1 MG disintegrating tablet Take 1 tablet (1 mg total) by mouth 2 (two) times daily.  . tamsulosin (FLOMAX) 0.4 MG CAPS capsule Take 1 capsule (0.4 mg total) by mouth daily after breakfast.  . traZODone (DESYREL) 50 MG tablet Take 1 tablet (50 mg total) by mouth at bedtime and may repeat dose one time if needed.   No facility-administered encounter medications on file as of 04/08/2016.     Allergies: Gabapentin; Ibuprofen; Zolpidem tartrate; and Naproxen  Social History   Social History  . Marital status: Single    Spouse name: N/A  . Number of children: 2  . Years of education: N/A    Occupational History  . disabled Not Employed   Social History Main Topics  . Smoking status: Never Smoker  . Smokeless tobacco: Never Used  . Alcohol use No     Comment: Pt reports he is not drinking now  . 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 Sexual activity: Not Currently   Other Topics Concern  . Not on file   Social History Narrative   Lives w/ son-23/22    Family History  Problem Relation Age of Onset  . Diabetes Mother   . Hypertension Mother   . Heart attack Mother   . Hypertension Father   . Diabetes Father   .  Heart attack Father   . Heart attack      mother, father, brother, sister all deceased due to MI  . Heart attack Sister   . Heart attack Brother   . Seizures Brother   . Heart failure Other   . Colon cancer Neg Hx   . Liver disease Neg Hx   . Anesthesia problems Neg Hx   . Hypotension Neg Hx   . Malignant hyperthermia Neg Hx   . Pseudochol deficiency Neg Hx   . Colon polyps Neg Hx     Review of Systems: Review of Systems  Constitutional: Positive for chills, diaphoresis and weight loss.  HENT: Positive for hearing loss.   Eyes: Negative.   Respiratory: Positive for cough, shortness of breath and wheezing. Negative for sputum production.   Cardiovascular: Positive for chest pain, palpitations, leg swelling and PND. Negative for orthopnea and claudication.  Gastrointestinal: Positive for abdominal pain.  Genitourinary: Negative.   Musculoskeletal: Positive for back pain, joint pain and myalgias.  Skin: Negative.   Neurological: Positive for dizziness and headaches. Negative for loss of consciousness.  Endo/Heme/Allergies: Negative.   Psychiatric/Behavioral: Positive for depression. Negative for substance abuse (clean x 2 years). The patient is nervous/anxious.    --------------------------------------------------------------------------------------------------  Physical Exam: BP  120/90 (BP Location: Right Arm, Patient Position: Sitting, Cuff Size: Large)   Pulse 62   Ht _0  (1.778 m)   Wt 234 lb 1.9 oz (106.2 kg)   SpO2 98%   BMI 33.59 kg/m   General:  Obese man, seated comfortably in the exam room. HEENT: No conjunctival pallor or scleral icterus.  Moist mucous membranes.  OP clear. Neck: Supple without lymphadenopathy, thyromegaly, JVD, or HJR.  No carotid bruit. Lungs: Normal work of breathing.  Clear to auscultation bilaterally without wheezes or crackles. Heart: Regular rate and rhythm without murmurs, rubs, or gallops.  Non-displaced PMI. Abd: Bowel sounds present.  Soft, NT/ND without hepatosplenomegaly Ext: No lower extremity edema.  Radial, PT, and DP pulses are 2+ bilaterally Skin: warm and dry without rash. Well-healed median sternotomy scar. Neuro: CNIII-XII intact. Ambulates with a cane. Psych: Normal mood and affect.  EKG:  Normal sinus rhythm without significant abnormalities. Compared with prior tracing from 01/28/16, PACs are no longer present.  Lab Results  Component Value Date   WBC 7.1 01/28/2016   HGB 11.7 (L) 01/28/2016   HCT 34.9 (L) 01/28/2016   MCV 85.7 01/28/2016   PLT 197 01/28/2016    Lab Results  Component Value Date   NA 140 01/28/2016   K 3.4 (L) 01/28/2016   CL 106 01/28/2016   CO2 26 01/28/2016   BUN 10 01/28/2016   CREATININE 1.09 01/28/2016   GLUCOSE 79 01/28/2016   ALT 15 (L) 12/30/2015    Lab Results  Component Value Date   CHOL 204 (H) 12/16/2015   HDL 32 (L) 12/16/2015   LDLCALC 103 (H) 12/16/2015   TRIG 343 (H) 12/16/2015   CHOLHDL 6.4 12/16/2015    --------------------------------------------------------------------------------------------------  ASSESSMENT AND PLAN: Chest pain Chest pain is atypical given that it occurs inconsistently with exertion and at rest. The patient has a history of CAD with single vessel CABG, most recent cath in 2013, which I have personally reviewed the images from,  demonstrates no significant CAD. In addition myocardial perfusion stress test 3 months ago without evidence of ischemia. It is possible that he may have some degree of microvascular dysfunction or vasospasm. We have therefore agreed to add isosorbide mononitrate  30 mg daily. I see no strong indication for dual antiplatelet therapy going forward. Given history of gastritis him a we will discontinue clopidodgrel to minimize risk for bleeding. Patient should remain on indefinite low-dose aspirin.  Premature atrial contractions I suspect PACs and short atrial runs are responsible for his palpitations. We will continue with metoprolol.  Dyspnea on exertion Chronic problem for the patient. He appears euvolemic on exam today. Echocardiogram in 12/2015 showed preserved LVEF with grade 2 diastolic dysfunction. We discussed the importance of monitoring of his volume status as well as salt restriction. Addition of isosorbide mononitrate also might help with preload reduction. We will continue antihypertensive regimen. I also encouraged Mr. Tallarico to speak with his PCP regarding a sleep study, as some of his symptoms may be due to undiagnosed sleep apnea.  Hyperlipidemia This recent lipid panel in 11/2015 was notable for low HDL, mildly elevated LDL, and mild hypertriglyceridemia. Given his history of MIs and diabetes mellitus, he should be on a high intensity statin. We have agreed to increase atorvastatin 20 mg daily as a first step and to recheck his lipid panel and ALT shortly before he returns for follow-up in 3 months.   Hypertension Mildly elevated diastolic pressure today. As above, we will add isosorbide mononitrate, which may have some beneficial blood pressure effects. Evaluation for sleep apnea should also be considered, particularly given report of PND. I encouraged Mr. [place] Silos to discuss this with his PCP.  Follow-up: Return to clinic in 3 months.  Nelva Bush, MD 04/07/2016 8:07 PM

## 2016-04-08 ENCOUNTER — Encounter: Payer: Self-pay | Admitting: Internal Medicine

## 2016-04-08 ENCOUNTER — Ambulatory Visit (INDEPENDENT_AMBULATORY_CARE_PROVIDER_SITE_OTHER): Payer: Medicare Other | Admitting: Internal Medicine

## 2016-04-08 ENCOUNTER — Encounter (INDEPENDENT_AMBULATORY_CARE_PROVIDER_SITE_OTHER): Payer: Self-pay

## 2016-04-08 VITALS — BP 120/90 | HR 62 | Ht 70.0 in | Wt 234.1 lb

## 2016-04-08 DIAGNOSIS — E782 Mixed hyperlipidemia: Secondary | ICD-10-CM | POA: Diagnosis not present

## 2016-04-08 DIAGNOSIS — I10 Essential (primary) hypertension: Secondary | ICD-10-CM

## 2016-04-08 DIAGNOSIS — E785 Hyperlipidemia, unspecified: Secondary | ICD-10-CM | POA: Insufficient documentation

## 2016-04-08 DIAGNOSIS — I491 Atrial premature depolarization: Secondary | ICD-10-CM

## 2016-04-08 DIAGNOSIS — R079 Chest pain, unspecified: Secondary | ICD-10-CM

## 2016-04-08 DIAGNOSIS — R0609 Other forms of dyspnea: Secondary | ICD-10-CM

## 2016-04-08 MED ORDER — ATORVASTATIN CALCIUM 20 MG PO TABS: 20.0000 mg | ORAL_TABLET | Freq: Every day | ORAL | 1 refills | Status: DC

## 2016-04-08 MED ORDER — ISOSORBIDE MONONITRATE ER 30 MG PO TB24: 30.0000 mg | ORAL_TABLET | Freq: Every day | ORAL | 1 refills | Status: DC

## 2016-04-08 NOTE — Patient Instructions (Signed)
Medication Instructions:  Stop clopidogrel (Plavix)  Continue taking aspirin 81mg  daily.  Start Imdur (isosorbide mononitrate) 30mg  daily  Increase atorvastatin to 20mg  daily. You can take 2 of your 10mg  tablets daily at the same time and use your current supply.  Labwork: Your physician recommends that you return for a FASTING lipid profile /ALT in about 3 months a few days before your 3 month appointment with Dr End.   Testing/Procedures: none  Follow-Up: Your physician recommends that you schedule a follow-up appointment in: 3 months with Dr End. Get fasting lab a few days before this appointment.         If you need a refill on your cardiac medications before your next appointment, please call your pharmacy.

## 2016-04-15 ENCOUNTER — Other Ambulatory Visit: Payer: Self-pay

## 2016-04-15 ENCOUNTER — Telehealth: Payer: Self-pay | Admitting: Gastroenterology

## 2016-04-15 DIAGNOSIS — R1310 Dysphagia, unspecified: Secondary | ICD-10-CM

## 2016-04-15 NOTE — Telephone Encounter (Signed)
This is not clear who is giving orders. I know this was copy and pasted from EGD report but we need to be more clear about this.   Per [facility] EGD recommendations, patient should have follow up 3 months and then decision to be made about the referral. NO referral needed until patient seen in follow up.

## 2016-04-15 NOTE — Telephone Encounter (Signed)
IF PT FAILS MEDICAL MANAGEMENT, CONSIDER REFERRAL TO [facility] FOR DYSPHAGIA/ESOPHAGEAL MANOMETRY.

## 2016-04-15 NOTE — Telephone Encounter (Signed)
Referral has been faxed.

## 2016-04-15 NOTE — Telephone Encounter (Signed)
Pt has PP follow up in April and then it will be addressed

## 2016-04-16 NOTE — Telephone Encounter (Signed)
I spoke with Dalton [name] at [facility] and cancelled the procedure.

## 2016-04-16 NOTE — Telephone Encounter (Signed)
Referral cancelled. 

## 2016-04-16 NOTE — Telephone Encounter (Signed)
Noted  

## 2016-05-31 ENCOUNTER — Ambulatory Visit: Payer: Medicare Other | Admitting: Gastroenterology

## 2016-06-01 ENCOUNTER — Ambulatory Visit (INDEPENDENT_AMBULATORY_CARE_PROVIDER_SITE_OTHER): Payer: Medicare Other | Admitting: Gastroenterology

## 2016-06-01 ENCOUNTER — Other Ambulatory Visit: Payer: Self-pay

## 2016-06-01 ENCOUNTER — Encounter: Payer: Self-pay | Admitting: Gastroenterology

## 2016-06-01 ENCOUNTER — Telehealth: Payer: Self-pay | Admitting: Gastroenterology

## 2016-06-01 VITALS — BP 128/76 | HR 70 | Temp 98.2°F | Ht 70.0 in | Wt 252.8 lb

## 2016-06-01 DIAGNOSIS — K5900 Constipation, unspecified: Secondary | ICD-10-CM

## 2016-06-01 DIAGNOSIS — K625 Hemorrhage of anus and rectum: Secondary | ICD-10-CM

## 2016-06-01 DIAGNOSIS — R1310 Dysphagia, unspecified: Secondary | ICD-10-CM | POA: Diagnosis not present

## 2016-06-01 DIAGNOSIS — R1319 Other dysphagia: Secondary | ICD-10-CM

## 2016-06-01 DIAGNOSIS — K219 Gastro-esophageal reflux disease without esophagitis: Secondary | ICD-10-CM | POA: Diagnosis not present

## 2016-06-01 MED ORDER — POLYETHYLENE GLYCOL 3350 17 G PO PACK: PACK | ORAL | 5 refills | Status: DC

## 2016-06-01 NOTE — Progress Notes (Signed)
cc'ed to pcp °

## 2016-06-01 NOTE — Patient Instructions (Addendum)
1. We will start referral process to [facility] for your swallowing problems. 2. Please call and let me know what medication you are taking for acid reflux, ?omeprazole or pantoprazole.  3. Take MiraLAX 1 packet twice daily until soft bowel movement, then continue once daily on days you do not have a good bowel movement. Samples and prescription provided. 4. Return office visit here to see Dr. Oneida Alar in 3 months.

## 2016-06-01 NOTE — Progress Notes (Signed)
Primary Care Physician: Dalton Fendt, MD  Primary Gastroenterologist:  Dalton Drain, MD   Chief Complaint  Patient presents with  . Dysphagia    at times  . Blood In Stools    when took Pylera, no blood now    HPI: Dalton [name] is a 56 y.o. male hereFor follow-up. Seen back in January with dysphagia. Previously did well on Nexium but began having symptoms when he was switched to pantoprazole. Was having frequent heartburn at that time as well. Complain of nocturnal symptoms. Recent upper endoscopy revealed normal-appearing esophagus, empiric dilation given history of dysphagia. He had H. pylori gastritis. He was treated with Pylera.  Occasional sharp pain like a knot in the stomach, had frequently while on Pylera. Developed diarrhea with small volume bright red blood per rectum. This is now resolved. He has a history of hemorrhoids. Overall the swallowing is somewhat better. Initially states it was a lot better but the more he talked the more he was complaining of food becoming lodged in the upper esophagus. Describes significant postnasal drip. Symptoms are worse when he first gets up. Notes that he has to try to sit up in bed at night or he feels like his throat gets clogged up. Takes bid ppi, sometimes takes up to 3 daily. Not sure which PPI he is on at this time, possibly omeprazole versus pantoprazole. He will let me know. Typical heartburn a lot better. Trying to avoid spicy foods. Per [name] constipated at this point. Takes magnesium citrate almost weekly.   Current Outpatient Prescriptions  Medication Sig Dispense Refill  . albuterol (PROVENTIL HFA;VENTOLIN HFA) 108 (90 Base) MCG/ACT inhaler Inhale 1-2 puffs into the lungs every 6 (six) hours as needed for wheezing or shortness of breath.    . alprazolam (XANAX) 2 MG tablet Take 2 mg by mouth 2 (two) times daily.    [name] Kitchen aspirin EC 81 MG tablet Take 81 mg by mouth daily.    [name] Kitchen atorvastatin (LIPITOR) 20 MG tablet Take 1  tablet (20 mg total) by mouth daily. 90 tablet 1  . cetirizine (ZYRTEC) 10 MG tablet Take 10 mg by mouth daily.  0  . citalopram (CELEXA) 20 MG tablet Take 1 tablet (20 mg total) by mouth daily. 30 tablet 0  . hydrochlorothiazide (HYDRODIURIL) 25 MG tablet Take 1 tablet (25 mg total) by mouth daily. 30 tablet 0  . ipratropium (ATROVENT) 0.02 % nebulizer solution Take 0.5 mg by nebulization 2 (two) times daily as needed (for congestion).    . isosorbide mononitrate (IMDUR) 30 MG 24 hr tablet Take 1 tablet (30 mg total) by mouth daily. 90 tablet 1  . levETIRAcetam (KEPPRA) 500 MG tablet Take 1 tablet (500 mg total) by mouth every 12 (twelve) hours. 30 tablet 0  . LYRICA 100 MG capsule Take 100 mg by mouth 2 (two) times daily.  0  . metFORMIN (GLUCOPHAGE) 500 MG tablet Take 1 tablet (500 mg total) by mouth daily with breakfast. (Patient taking differently: Take 500 mg by mouth 2 (two) times daily with a meal. ) 30 tablet 0  . metoprolol tartrate (LOPRESSOR) 25 MG tablet Take 0.5 tablets (12.5 mg total) by mouth 2 (two) times daily. 30 tablet 0  . pantoprazole (PROTONIX) 40 MG tablet Take 1 tablet (40 mg total) by mouth 2 (two) times daily. 60 tablet 11  . potassium chloride (K-DUR,KLOR-CON) 10 MEQ tablet Take 1 tablet (10 mEq total) by mouth daily. 30 tablet 0  .  risperiDONE (RISPERDAL M-TABS) 1 MG disintegrating tablet Take 1 tablet (1 mg total) by mouth 2 (two) times daily. 60 tablet 0  . tamsulosin (FLOMAX) 0.4 MG CAPS capsule Take 1 capsule (0.4 mg total) by mouth daily after breakfast. 30 capsule 0   No current facility-administered medications for this visit.     Allergies as of 06/01/2016 - Review Complete 06/01/2016  Allergen Reaction Noted  . Gabapentin Hives   . Ibuprofen Other (See Comments)   . Zolpidem tartrate Other (See Comments)   . Naproxen Other (See Comments)    Past Medical History:  Diagnosis Date  . Anemia   . Anxiety   . Bipolar disorder ([place])   . CHF (congestive  heart failure) ([place])   . Chronic back pain    Pain Clinic in [place]  . Chronic bronchitis   . Chronic lower back pain   . Congestive heart failure (CHF) ([place])   . Coronary artery disease   . Coughing   . Depression   . DVT (deep venous thrombosis) ([place]) ~ 2005   LLE  . Frequency of urination   . GERD (gastroesophageal reflux disease)   . 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 JJKKXFGH(829.9)    "a few times/week" (12/24/2015)  . High cholesterol   . Hypertension   . Laceration of right hand 11/27/2010  . Laceration of wrist 2007 BIL FOREARMS  . MI (myocardial infarction)    8, last one was in 2011 (12/24/2015)  . NSAID-induced gastric ulcer    "Ibuprofen"  . PUD (peptic ulcer disease)    in 1990s, secondary to medication  . Rheumatoid arthritis ([place])    "all over" (12/24/2015)  . Shortness of breath    with exertion  . Tonsillitis, chronic    Dr. Vicki Mallet in [place]  . Type II diabetes mellitus ([place])    Past Surgical History:  Procedure Laterality Date  . BACK SURGERY    . BIOPSY N/A 05/30/2012   Procedure: BIOPSY;  Surgeon: Danie Binder, MD;  Location: [facility] ORS;  Service: Endoscopy;  Laterality: N/A;  . BIOPSY  03/02/2016   Procedure: BIOPSY;  Surgeon: Danie Binder, MD;  Location: [facility] ENDO SUITE;  Service: Endoscopy;;  gastric  . CARDIAC CATHETERIZATION  "several"  . CARPAL TUNNEL RELEASE Bilateral   . COLONOSCOPY  12/28/2010   SLF: (MAC)Internal hemorrhoids/four small colon polyps tubular adenomas. per SLF: colonoscopy 2022  . CORONARY ARTERY BYPASS GRAFT  2002   3 vessels  . ESOPHAGOGASTRODUODENOSCOPY N/A 05/30/2012   SLF: UNCONTROLLED GERD DUE TO LIFESTYLE CHOICE/WEIGHT GAIN/MILD Non-erosive gastritis  . ESOPHAGOGASTRODUODENOSCOPY (EGD) WITH PROPOFOL N/A 03/02/2016   Dr. Oneida Alar: Esophagus appeared normal, impaired dilation performed, patchy inflammation with edema and erythema of the entire  stomach., Biopsy with H pylori, patient completed Pylera.   [name] Kitchen FRACTURE SURGERY    . LEFT HEART CATHETERIZATION WITH CORONARY ANGIOGRAM N/A 08/31/2011   Procedure: LEFT HEART CATHETERIZATION WITH CORONARY ANGIOGRAM;  Surgeon: Laverda Page, MD;  Location: [facility] CATH LAB;  Service: Cardiovascular;  Laterality: N/A;  . LUMBAR DISC SURGERY     "L4-5; Dr. Trenton Gammon"  . ORIF MANDIBULAR FRACTURE N/A 12/19/2015   Procedure: OPEN REDUCTION INTERNAL FIXATION (ORIF) MANDIBULAR FRACTURE;  Surgeon: Izora Gala, MD;  Location: [place];  Service: ENT;  Laterality: N/A;  . PATELLA FRACTURE SURGERY Left 1976   plate to knee cap from accident  . SAVORY DILATION  12/28/2010  SLF:(MAC)J-shaped stomach/nodular mocosa in the distal esophagus/empiric dilation 37m  . SAVORY DILATION N/A 03/02/2016   Procedure: SAVORY DILATION;  Surgeon: SDanie Binder MD;  Location: [facility];  Service: Endoscopy;  Laterality: N/A;   Family History  Problem Relation Age of Onset  . Diabetes Mother   . Hypertension Mother   . Heart attack Mother 662 . Hypertension Father   . Diabetes Father   . Heart attack Father 559 . Heart attack      mother, father, brother, sister all deceased due to MI  . Heart attack Sister   . Heart attack Brother   . Seizures Brother   . Heart failure Other   . Colon cancer Neg Hx   . Liver disease Neg Hx   . Anesthesia problems Neg Hx   . Hypotension Neg Hx   . Malignant hyperthermia Neg Hx   . Pseudochol deficiency Neg Hx   . Colon polyps Neg Hx    Social History   Social History  . Marital status: Single    Spouse name: N/A  . Number of children: 2  . Years of education: N/A   Occupational History  . disabled Not Employed   Social History Main Topics  . Smoking status: Never Smoker  . Smokeless tobacco: Never Used  . Alcohol use No  . 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 Sexual activity: Not Currently   Other Topics Concern  . None   Social History Narrative    Lives w/ son-23/22    ROS:  General: Negative for anorexia, weight loss, fever, chills, fatigue, weakness. ENT: Negative for hoarseness, see hpi CV: Negative for chest pain, angina, palpitations, dyspnea on exertion, peripheral edema.  Respiratory: Negative for dyspnea at rest, dyspnea on exertion, cough, sputum, wheezing.  GI: See history of present illness. GU:  Negative for dysuria, hematuria, urinary incontinence, urinary frequency, nocturnal urination.  Endo: Negative for unusual weight change.    Physical Examination:   BP 128/76   Pulse 70   Temp 98.2 F (36.8 C) (Oral)   Ht 5' 10"  (1.778 m)   Wt 252 lb 12.8 oz (114.7 kg)   BMI 36.27 kg/m   General: Well-nourished, well-developed in no acute distress.  Eyes: No icterus. Mouth: Oropharyngeal mucosa moist and pink , no lesions erythema or exudate. Lungs: Clear to auscultation bilaterally.  Heart: Regular rate and rhythm, no murmurs rubs or gallops.  Abdomen: Bowel sounds are normal, nontender, nondistended, no hepatosplenomegaly or masses, no abdominal bruits or hernia , no rebound or guarding.   Extremities: No lower extremity edema. No clubbing or deformities. Neuro: Alert and oriented x 4   Skin: Warm and dry, no jaundice.   Psych: Alert and cooperative, normal mood and affect.

## 2016-06-01 NOTE — Assessment & Plan Note (Signed)
Normal-appearing esophagus on EGD. Dysphagia possibly related to uncontrolled GERD versus esophageal motility disorder. Per Dr. Oneida Alar plan, refer to [facility] for possible esophageal manometry/dysphagia workup. Next  Patient will call and let me know which PPI he is on currently. We can provide further recommendations once we know what he is taking. Discussed antireflux measures including elevating the head of the bed or getting a pillow wedge to elevate his torso.

## 2016-06-01 NOTE — Assessment & Plan Note (Signed)
MiraLAX 1 packet twice a day until regular bowel movement then once daily to continue having adequate bowel movements. Samples and prescription provided.  Recent bright red blood per rectum in the setting of loose stools on Pylera,  possibly hemorrhoids. Occasional bright red blood per rectum in the setting of constipation. Last colonoscopy 2012, had multiple simple adenomas, next one planned for 2022. We'll update CBC, consider updating colonoscopy if evidence of anemia and/or recurrent rectal bleeding.

## 2016-06-01 NOTE — Telephone Encounter (Signed)
Patient called to let you know that the other medication he is taking is pantoprazole 40mg 

## 2016-06-04 MED ORDER — DEXLANSOPRAZOLE 60 MG PO CPDR: 60.0000 mg | DELAYED_RELEASE_CAPSULE | Freq: Every day | ORAL | 5 refills | Status: DC

## 2016-06-04 NOTE — Addendum Note (Signed)
Addended by: Mahala Menghini on: 06/04/2016 02:27 PM   Modules accepted: Orders

## 2016-06-04 NOTE — Telephone Encounter (Signed)
Patient with poorly controlled gerd/dysphagia taking 2-3 pantoprazole daily.   Please have patient stop pantoprazole. Try Dexilant 60mg  daily (once daily before breakfast). rx sent to [facility].   Needs CBC for rectal bleeding.

## 2016-06-07 NOTE — Telephone Encounter (Signed)
LMOM for a return call. Lab orders entered and he needs to go to [place] also.

## 2016-06-07 NOTE — Progress Notes (Signed)
REVIEWED. AGREE. NO ADDITIONAL RECOMMENDATIONS. 

## 2016-06-08 LAB — CBC WITH DIFFERENTIAL/PLATELET
BASOS ABS: 0 {cells}/uL (ref 0–200)
Basophils Relative: 0 %
Eosinophils Absolute: 192 cells/uL (ref 15–500)
Eosinophils Relative: 4 %
HCT: 38.5 % (ref 38.5–50.0)
Hemoglobin: 13.4 g/dL (ref 13.2–17.1)
LYMPHS ABS: 2400 {cells}/uL (ref 850–3900)
LYMPHS PCT: 50 %
MCH: 29.9 pg (ref 27.0–33.0)
MCHC: 34.8 g/dL (ref 32.0–36.0)
MCV: 85.9 fL (ref 80.0–100.0)
MONOS PCT: 6 %
MPV: 10.7 fL (ref 7.5–12.5)
Monocytes Absolute: 288 cells/uL (ref 200–950)
NEUTROS ABS: 1920 {cells}/uL (ref 1500–7800)
NEUTROS PCT: 40 %
PLATELETS: 221 10*3/uL (ref 140–400)
RBC: 4.48 MIL/uL (ref 4.20–5.80)
RDW: 15 % (ref 11.0–15.0)
WBC: 4.8 10*3/uL (ref 3.8–10.8)

## 2016-06-08 NOTE — Telephone Encounter (Signed)
Pt is aware to stop Pantoprazole and stop Dexilant. He will try to go to the lab today for his CBC.

## 2016-06-09 NOTE — Patient Instructions (Signed)
Your procedure is scheduled on: 06/15/2016  Report to [facility] at  640   AM.  Call this number if you have problems the morning of surgery: [phone]   Do not eat food or drink liquids :After Midnight.      Take these medicines the morning of surgery with A SIP OF WATER: xanax, zyrtec, dexilant, imdur, lisinopril, lyrica, anitvert, metoprolol, prilosec, zoloft, flomax. Use your nebulizer and your inhalers before you come. DO NOT take any medications for diabetes the morning of your surgery.   Do not wear jewelry, make-up or nail polish.  Do not wear lotions, powders, or perfumes. You may wear deodorant.  Do not shave 48 hours prior to surgery.  Do not bring valuables to the hospital.  Contacts, dentures or bridgework may not be worn into surgery.  Leave suitcase in the car. After surgery it may be brought to your room.  For patients admitted to the hospital, checkout time is 11:00 AM the day of discharge.   Patients discharged the day of surgery will not be allowed to drive home.  :     Please read over the following fact sheets that you were given: Coughing and Deep Breathing, Surgical Site Infection Prevention, Anesthesia Post-op Instructions and Care and Recovery After Surgery    Cataract A cataract is a clouding of the lens of the eye. When a lens becomes cloudy, vision is reduced based on the degree and nature of the clouding. Many cataracts reduce vision to some degree. Some cataracts make people more near-sighted as they develop. Other cataracts increase glare. Cataracts that are ignored and become worse can sometimes look white. The white color can be seen through the pupil. CAUSES   Aging. However, cataracts may occur at any age, even in newborns.   Certain drugs.   Trauma to the eye.   Certain diseases such as diabetes.   Specific eye diseases such as chronic inflammation inside the eye or a sudden attack of a rare form of glaucoma.   Inherited or acquired medical  problems.  SYMPTOMS   Gradual, progressive drop in vision in the affected eye.   Severe, rapid visual loss. This most often happens when trauma is the cause.  DIAGNOSIS  To detect a cataract, an eye doctor examines the lens. Cataracts are best diagnosed with an exam of the eyes with the pupils enlarged (dilated) by drops.  TREATMENT  For an early cataract, vision may improve by using different eyeglasses or stronger lighting. If that does not help your vision, surgery is the only effective treatment. A cataract needs to be surgically removed when vision loss interferes with your everyday activities, such as driving, reading, or watching TV. A cataract may also have to be removed if it prevents examination or treatment of another eye problem. Surgery removes the cloudy lens and usually replaces it with a substitute lens (intraocular lens, IOL).  At a time when both you and your doctor agree, the cataract will be surgically removed. If you have cataracts in both eyes, only one is usually removed at a time. This allows the operated eye to heal and be out of danger from any possible problems after surgery (such as infection or poor wound healing). In rare cases, a cataract may be doing damage to your eye. In these cases, your caregiver may advise surgical removal right away. The vast majority of people who have cataract surgery have better vision afterward. HOME CARE INSTRUCTIONS  If you are not planning  surgery, you may be asked to do the following:  Use different eyeglasses.   Use stronger or brighter lighting.   Ask your eye doctor about reducing your medicine dose or changing medicines if it is thought that a medicine caused your cataract. Changing medicines does not make the cataract go away on its own.   Become familiar with your surroundings. Poor vision can lead to injury. Avoid bumping into things on the affected side. You are at a higher risk for tripping or falling.   Exercise extreme  care when driving or operating machinery.   Wear sunglasses if you are sensitive to bright light or experiencing problems with glare.  SEEK IMMEDIATE MEDICAL CARE IF:   You have a worsening or sudden vision loss.   You notice redness, swelling, or increasing pain in the eye.   You have a fever.  Document Released: 02/08/2005 Document Revised: 01/28/2011 Document Reviewed: 10/02/2010 [facility] Patient Information 2012 [place].PATIENT INSTRUCTIONS POST-ANESTHESIA  IMMEDIATELY FOLLOWING SURGERY:  Do not drive or operate machinery for the first twenty four hours after surgery.  Do not make any important decisions for twenty four hours after surgery or while taking narcotic pain medications or sedatives.  If you develop intractable nausea and vomiting or a severe headache please notify your doctor immediately.  FOLLOW-UP:  Please make an appointment with your surgeon as instructed. You do not need to follow up with anesthesia unless specifically instructed to do so.  WOUND CARE INSTRUCTIONS (if applicable):  Keep a dry clean dressing on the anesthesia/puncture wound site if there is drainage.  Once the wound has quit draining you may leave it open to air.  Generally you should leave the bandage intact for twenty four hours unless there is drainage.  If the epidural site drains for more than 36-48 hours please call the anesthesia department.  QUESTIONS?:  Please feel free to call your physician or the hospital operator if you have any questions, and they will be happy to assist you.

## 2016-06-11 ENCOUNTER — Encounter (HOSPITAL_COMMUNITY): Payer: Self-pay

## 2016-06-11 ENCOUNTER — Encounter (HOSPITAL_COMMUNITY)
Admission: RE | Admit: 2016-06-11 | Discharge: 2016-06-11 | Disposition: A | Discharge status: Home / Self Care | Payer: Medicare Other | Source: Ambulatory Visit | Attending: Ophthalmology | Admitting: Ophthalmology

## 2016-06-11 DIAGNOSIS — H2512 Age-related nuclear cataract, left eye: Secondary | ICD-10-CM | POA: Diagnosis not present

## 2016-06-11 DIAGNOSIS — Z01812 Encounter for preprocedural laboratory examination: Secondary | ICD-10-CM | POA: Insufficient documentation

## 2016-06-11 HISTORY — DX: Polyneuropathy, unspecified: G62.9

## 2016-06-11 LAB — CBC WITH DIFFERENTIAL/PLATELET
Basophils Absolute: 0 10*3/uL (ref 0.0–0.1)
Basophils Relative: 0 %
EOS ABS: 0.2 10*3/uL (ref 0.0–0.7)
EOS PCT: 4 %
HCT: 36.8 % — ABNORMAL LOW (ref 39.0–52.0)
Hemoglobin: 12.7 g/dL — ABNORMAL LOW (ref 13.0–17.0)
LYMPHS ABS: 2 10*3/uL (ref 0.7–4.0)
Lymphocytes Relative: 47 %
MCH: 29.7 pg (ref 26.0–34.0)
MCHC: 34.5 g/dL (ref 30.0–36.0)
MCV: 86.2 fL (ref 78.0–100.0)
MONO ABS: 0.3 10*3/uL (ref 0.1–1.0)
MONOS PCT: 7 %
Neutro Abs: 1.8 10*3/uL (ref 1.7–7.7)
Neutrophils Relative %: 42 %
PLATELETS: 204 10*3/uL (ref 150–400)
RBC: 4.27 MIL/uL (ref 4.22–5.81)
RDW: 14.2 % (ref 11.5–15.5)
WBC: 4.2 10*3/uL (ref 4.0–10.5)

## 2016-06-11 LAB — BASIC METABOLIC PANEL
Anion gap: 6 (ref 5–15)
BUN: 10 mg/dL (ref 6–20)
CALCIUM: 9.2 mg/dL (ref 8.9–10.3)
CO2: 30 mmol/L (ref 22–32)
CREATININE: 0.94 mg/dL (ref 0.61–1.24)
Chloride: 105 mmol/L (ref 101–111)
GFR calc Af Amer: >60 mL/min (ref >60)
Glucose, Bld: 88 mg/dL (ref 65–99)
Potassium: 3.5 mmol/L (ref 3.5–5.1)
Sodium: 141 mmol/L (ref 135–145)

## 2016-06-11 NOTE — Progress Notes (Signed)
   06/11/16 1305  OBSTRUCTIVE SLEEP APNEA  Have you ever been diagnosed with sleep apnea through a sleep study? No  Do you snore loudly (loud enough to be heard through closed doors)?  0  Do you often feel tired, fatigued, or sleepy during the daytime (such as falling asleep during driving or talking to someone)? 1  Has anyone observed you stop breathing during your sleep? 1  Do you have, or are you being treated for high blood pressure? 1  BMI more than 35 kg/m2? 1  Age > 50 (1-yes) 1  Neck circumference greater than:Male 16 inches or larger, Male 17inches or larger? 1  Male Gender (Yes=1) 1  Obstructive Sleep Apnea Score 7  Score 5 or greater  Results sent to PCP

## 2016-06-14 ENCOUNTER — Encounter (HOSPITAL_COMMUNITY): Payer: Self-pay | Admitting: *Deleted

## 2016-06-15 ENCOUNTER — Ambulatory Visit (HOSPITAL_COMMUNITY): Payer: Medicare Other | Admitting: Anesthesiology

## 2016-06-15 ENCOUNTER — Encounter (HOSPITAL_COMMUNITY)
Admission: RE | Disposition: A | Discharge status: Home / Self Care | Payer: Self-pay | Source: Ambulatory Visit | Attending: Ophthalmology

## 2016-06-15 ENCOUNTER — Ambulatory Visit (HOSPITAL_COMMUNITY)
Admission: RE | Admit: 2016-06-15 | Discharge: 2016-06-15 | Disposition: A | Discharge status: Home / Self Care | Payer: Medicare Other | Source: Ambulatory Visit | Attending: Ophthalmology | Admitting: Ophthalmology

## 2016-06-15 ENCOUNTER — Encounter (HOSPITAL_COMMUNITY): Payer: Self-pay | Admitting: *Deleted

## 2016-06-15 DIAGNOSIS — Z955 Presence of coronary angioplasty implant and graft: Secondary | ICD-10-CM | POA: Insufficient documentation

## 2016-06-15 DIAGNOSIS — I110 Hypertensive heart disease with heart failure: Secondary | ICD-10-CM | POA: Insufficient documentation

## 2016-06-15 DIAGNOSIS — I252 Old myocardial infarction: Secondary | ICD-10-CM | POA: Diagnosis not present

## 2016-06-15 DIAGNOSIS — Z79899 Other long term (current) drug therapy: Secondary | ICD-10-CM | POA: Diagnosis not present

## 2016-06-15 DIAGNOSIS — F319 Bipolar disorder, unspecified: Secondary | ICD-10-CM | POA: Insufficient documentation

## 2016-06-15 DIAGNOSIS — E1136 Type 2 diabetes mellitus with diabetic cataract: Secondary | ICD-10-CM | POA: Insufficient documentation

## 2016-06-15 DIAGNOSIS — I2510 Atherosclerotic heart disease of native coronary artery without angina pectoris: Secondary | ICD-10-CM | POA: Diagnosis not present

## 2016-06-15 DIAGNOSIS — M1990 Unspecified osteoarthritis, unspecified site: Secondary | ICD-10-CM | POA: Insufficient documentation

## 2016-06-15 DIAGNOSIS — F419 Anxiety disorder, unspecified: Secondary | ICD-10-CM | POA: Insufficient documentation

## 2016-06-15 DIAGNOSIS — R51 Headache: Secondary | ICD-10-CM | POA: Diagnosis not present

## 2016-06-15 DIAGNOSIS — I509 Heart failure, unspecified: Secondary | ICD-10-CM | POA: Insufficient documentation

## 2016-06-15 HISTORY — PX: CATARACT EXTRACTION W/PHACO: SHX586

## 2016-06-15 LAB — GLUCOSE, CAPILLARY: Glucose-Capillary: 87 mg/dL (ref 65–99)

## 2016-06-15 SURGERY — PHACOEMULSIFICATION, CATARACT, WITH IOL INSERTION
Anesthesia: Monitor Anesthesia Care | Site: Eye | Laterality: Right

## 2016-06-15 MED ORDER — KETOROLAC TROMETHAMINE 0.5 % OP SOLN: 1.0000 [drp] | OPHTHALMIC | Status: AC

## 2016-06-15 MED ORDER — LIDOCAINE HCL (PF) 1 % IJ SOLN
INTRAMUSCULAR | Status: DC | PRN
  Administered 2016-06-15: 1 mL

## 2016-06-15 MED ORDER — LACTATED RINGERS IV BOLUS (SEPSIS): 500.0000 mL | Freq: Once | INTRAVENOUS | Status: DC

## 2016-06-15 MED ORDER — CYCLOPENTOLATE-PHENYLEPHRINE 0.2-1 % OP SOLN: 1.0000 [drp] | OPHTHALMIC | Status: AC

## 2016-06-15 MED ORDER — EPINEPHRINE PF 1 MG/ML IJ SOLN
INTRAOCULAR | Status: DC | PRN
  Administered 2016-06-15: 09:00:00

## 2016-06-15 MED ORDER — PHENYLEPHRINE HCL 2.5 % OP SOLN: 1.0000 [drp] | OPHTHALMIC | Status: AC

## 2016-06-15 MED ORDER — FENTANYL CITRATE (PF) 100 MCG/2ML IJ SOLN
25.0000 ug | INTRAMUSCULAR | Status: AC
  Administered 2016-06-15: 25 ug via INTRAVENOUS

## 2016-06-15 MED ORDER — TETRACAINE HCL 0.5 % OP SOLN: 1.0000 [drp] | OPHTHALMIC | Status: AC

## 2016-06-15 MED ORDER — FENTANYL CITRATE (PF) 100 MCG/2ML IJ SOLN
INTRAMUSCULAR | Status: AC
  Filled 2016-06-15: qty 2

## 2016-06-15 MED ORDER — MIDAZOLAM HCL 2 MG/2ML IJ SOLN
INTRAMUSCULAR | Status: AC
  Filled 2016-06-15: qty 2

## 2016-06-15 MED ORDER — KETOROLAC TROMETHAMINE 0.5 % OP SOLN
1.0000 [drp] | OPHTHALMIC | Status: AC
  Administered 2016-06-15 (×3): 1 [drp] via OPHTHALMIC

## 2016-06-15 MED ORDER — LIDOCAINE HCL (PF) 1 % IJ SOLN
INTRAMUSCULAR | Status: AC
  Filled 2016-06-15: qty 2

## 2016-06-15 MED ORDER — CYCLOPENTOLATE-PHENYLEPHRINE 0.2-1 % OP SOLN
1.0000 [drp] | OPHTHALMIC | Status: AC
  Administered 2016-06-15 (×3): 1 [drp] via OPHTHALMIC

## 2016-06-15 MED ORDER — TETRACAINE HCL 0.5 % OP SOLN
1.0000 [drp] | OPHTHALMIC | Status: AC
  Administered 2016-06-15 (×3): 1 [drp] via OPHTHALMIC

## 2016-06-15 MED ORDER — BSS IO SOLN
INTRAOCULAR | Status: DC | PRN
  Administered 2016-06-15: 15 mL via INTRAOCULAR

## 2016-06-15 MED ORDER — MIDAZOLAM HCL 2 MG/2ML IJ SOLN
1.0000 mg | INTRAMUSCULAR | Status: AC
Start: 2016-06-15 — End: 2016-06-15
  Administered 2016-06-15: 2 mg via INTRAVENOUS

## 2016-06-15 MED ORDER — PROVISC 10 MG/ML IO SOLN
INTRAOCULAR | Status: DC | PRN
  Administered 2016-06-15 (×2): 0.85 mL via INTRAOCULAR

## 2016-06-15 MED ORDER — LACTATED RINGERS IV SOLN
INTRAVENOUS | Status: DC
  Administered 2016-06-15: 08:00:00 via INTRAVENOUS

## 2016-06-15 MED ORDER — PHENYLEPHRINE HCL 2.5 % OP SOLN
1.0000 [drp] | OPHTHALMIC | Status: AC
  Administered 2016-06-15 (×3): 1 [drp] via OPHTHALMIC

## 2016-06-15 SURGICAL SUPPLY — 10 items
CLOTH BEACON ORANGE TIMEOUT ST (SAFETY) ×2 IMPLANT
EYE SHIELD UNIVERSAL CLEAR (GAUZE/BANDAGES/DRESSINGS) ×2 IMPLANT
GLOVE EXAM NITRILE MD LF STRL (GLOVE) ×2 IMPLANT
LENS ALC ACRYL/TECN (Ophthalmic Related) ×3 IMPLANT
PAD ARMBOARD 7.5X6 YLW CONV (MISCELLANEOUS) ×2 IMPLANT
RING MALYGIN (MISCELLANEOUS) ×2 IMPLANT
TAPE SURG TRANSPARENT 2IN (GAUZE/BANDAGES/DRESSINGS) IMPLANT
TAPE TRANSPARENT 2IN (GAUZE/BANDAGES/DRESSINGS) ×2
VISCOELASTIC ADDITIONAL (OPHTHALMIC RELATED) ×2 IMPLANT
WATER STERILE IRR 250ML POUR (IV SOLUTION) ×2 IMPLANT

## 2016-06-15 NOTE — Transfer of Care (Signed)
Immediate Anesthesia Transfer of Care Note  Patient: Dalton [name]  Procedure(s) Performed: Procedure(s) with comments: CATARACT EXTRACTION PHACO AND INTRAOCULAR LENS PLACEMENT (IOC) (Right) - CDE: 4.64  Patient Location: PACU  Anesthesia Type:MAC  Level of Consciousness: awake  Airway & Oxygen Therapy: Patient Spontanous Breathing  Post-op Assessment: Report given to RN  Post vital signs: Reviewed  Last Vitals:  Vitals:   06/15/16 0820 06/15/16 0825  BP: (!) 143/86 127/83  Pulse:    Resp: 12 (!) 49  Temp:      Last Pain:  Vitals:   06/15/16 0730  TempSrc: Oral  PainSc: 8       Patients Stated Pain Goal: 8 (94/07/68 0881)  Complications: No apparent anesthesia complications

## 2016-06-15 NOTE — Anesthesia Postprocedure Evaluation (Signed)
Anesthesia Post Note  Patient: Dalton [name]  Procedure(s) Performed: Procedure(s) (LRB): CATARACT EXTRACTION PHACO AND INTRAOCULAR LENS PLACEMENT (IOC) (Right)  Patient location during evaluation: Short Stay Anesthesia Type: MAC Level of consciousness: awake and alert and oriented Pain management: pain level controlled Vital Signs Assessment: post-procedure vital signs reviewed and stable Respiratory status: spontaneous breathing Cardiovascular status: blood pressure returned to baseline Postop Assessment: no signs of nausea or vomiting Anesthetic complications: no     Last Vitals:  Vitals:   06/15/16 0820 06/15/16 0825  BP: (!) 143/86 127/83  Pulse:    Resp: 12 (!) 49  Temp:      Last Pain:  Vitals:   06/15/16 0730  TempSrc: Oral  PainSc: 8                  Thalia Turkington

## 2016-06-15 NOTE — Anesthesia Preprocedure Evaluation (Signed)
Anesthesia Evaluation  Patient identified by MRN, date of birth, ID band Patient awake    Reviewed: Allergy & Precautions, H&P , NPO status , Patient's Chart, lab work & pertinent test results, reviewed documented beta blocker date and time   History of Anesthesia Complications Negative for: history of anesthetic complications  Airway Mallampati: III   Neck ROM: Full    Dental  (+) Edentulous Upper, Edentulous Lower   Pulmonary neg pulmonary ROS, shortness of breath,    Pulmonary exam normal        Cardiovascular hypertension, Pt. on medications (-) angina+ CAD (no angina recently), + Past MI, + Cardiac Stents, + CABG and +CHF   Rhythm:Regular Rate:Normal     Neuro/Psych  Headaches, Seizures -, Well Controlled,  PSYCHIATRIC DISORDERS Anxiety Depression Bipolar Disorder    GI/Hepatic PUD, GERD  Medicated and Controlled,  Endo/Other  diabetes, Type 2  Renal/GU      Musculoskeletal  (+) Arthritis  (chronic LBP),   Abdominal   Peds  Hematology   Anesthesia Other Findings   Reproductive/Obstetrics                             Anesthesia Physical Anesthesia Plan  ASA: III  Anesthesia Plan: MAC   Post-op Pain Management:    Induction: Intravenous  Airway Management Planned: Nasal Cannula  Additional Equipment:   Intra-op Plan:   Post-operative Plan:   Informed Consent: I have reviewed the patients History and Physical, chart, labs and discussed the procedure including the risks, benefits and alternatives for the proposed anesthesia with the patient or authorized representative who has indicated his/her understanding and acceptance.     Plan Discussed with:   Anesthesia Plan Comments:         Anesthesia Quick Evaluation

## 2016-06-15 NOTE — Op Note (Signed)
Patient brought to the operating room and prepped and draped in the usual manner.  Lid speculum inserted in right eye.  Stab incision made at the twelve o'clock position.  Intraocular Xylocaine instilled.  Provisc instilled in the anterior chamber.   A 2.4 mm. Stab incision was made temporally.  Due to a small pupil, a Malugyn Ring was inserted.  An anterior capsulotomy was done with a bent 25 gauge needle.  The nucleus was hydrodissected.  The Phaco tip was inserted in the anterior chamber and the nucleus was emulsified.  CDE was 4.64.  The cortical material was then removed with the I and A tip.  Posterior capsule was the polished.  The anterior chamber was deepened with Provisc.  A 20.5 Diopter Alcon [id number] IOL was then inserted in the capsular bag. The Malugyn Ring was removed.  Provisc was then removed with the I and A tip.  The wound was then hydrated.  Patient sent to the Recovery Room in good condition with follow up in my office.  Preoperative Diagnosis:  Cortical and Nuclear Cataract OD Postoperative Diagnosis:  Same Procedure name: Kelman Phacoemulsification OD with IOL

## 2016-06-15 NOTE — Discharge Instructions (Signed)
PATIENT INSTRUCTIONS POST-ANESTHESIA  IMMEDIATELY FOLLOWING SURGERY:  Do not drive or operate machinery for the first twenty four hours after surgery.  Do not make any important decisions for twenty four hours after surgery or while taking narcotic pain medications or sedatives.  If you develop intractable nausea and vomiting or a severe headache please notify your doctor immediately.  FOLLOW-UP:  Please make an appointment with your surgeon as instructed. You do not need to follow up with anesthesia unless specifically instructed to do so.  WOUND CARE INSTRUCTIONS (if applicable):  Keep a dry clean dressing on the anesthesia/puncture wound site if there is drainage.  Once the wound has quit draining you may leave it open to air.  Generally you should leave the bandage intact for twenty four hours unless there is drainage.  If the epidural site drains for more than 36-48 hours please call the anesthesia department.              [facility] - Inpatient Instructions [place] [address]-[place]      1. Avoid closing eyes tightly. One often closes the eye tightly when laughing, talking, sneezing, coughing or if they feel irritated. At these times, you should be careful not to close your eyes tightly.  2. Instill eye drops as instructed. To instill drops in your eye, open it, look up and have someone gently pull the lower lid down and instill a couple of drops inside the lower lid.  3. Do not touch upper lid.  4. Take Advil or Tylenol for pain.  5. You may use either eye for near work, such as reading or sewing and you may watch television.  6. You may have your hair done at the beauty parlor at any time.  7. Wear dark glasses with or without your own glasses if you are in bright light.  8. Call our office at [phone] or [phone] if you have sharp pain in your eye or unusual symptoms.  9.  FOLLOW UP WITH DR. SHAPIRO TODAY IN HIS [place] OFFICE AT   3:15 pm.    I have received a copy of the above instructions and will follow them.     IF YOU ARE IN IMMEDIATE DANGER CALL 911!  It is important for you to keep your follow-up appointment with your physician after discharge, OR, for you /your caregiver to make a follow-up appointment with your physician / medical provider after discharge.  Show these instructions to the next healthcare provider you see.   QUESTIONS?:  Please feel free to call your physician or the hospital operator if you have any questions, and they will be happy to assist you.      Cataract A cataract is cloudiness on the lens of your eye. The lens is the clear part of your eye that is behind your iris and pupil. The lens focuses light on the retina, which lets you see clearly. When a lens becomes cloudy, vision may become blurry. The clouding can range from a tiny dot to complete cloudiness. As some cataracts develop, they make a person more nearsighted. Other cataracts increase glare. Cataracts can worsen over time, and sometimes the pupil can look white. Cataracts get bigger and they cloud more of the lens, making it difficult to see. Cataracts can affect one eye or both eyes. What are the causes? Most cataracts are associated with age-related eye changes. The eye lens is mostly made up of water and protein. Normally, this protein is  arranged in a way that keeps the lens clear. Cataracts develop when protein begins to clump together over time. This clouds the lens and lets less light pass through to the retina, which causes blurry vision. What increases the risk? This condition is more likely to develop in people who:  Are 14 years of age or older.  Have diabetes.  Have high blood pressure.  Takecertain medicines, such as steroids or hormone replacement therapy.  Have had an eye injury.  Have or have had eye inflammation.  Have a family history of cataracts.  Smoke.  Drink alcohol heavily.  Are  frequently exposed to sun or very strong light without eye protection.  Are obese.  Have been exposed to large amounts of radiation, lead, or other toxic substances.  Have had eye surgery. What are the signs or symptoms? The main symptom of a cataract is blurry vision. Your vision may change or get worse over time. Other symptoms include:  Increased glare.  Seeing a bright ring or halo around light.  Poor night vision.  Double vision in one eye.  Having trouble seeing, even while wearing contact lenses or glasses.  Seeing colors that appear faded.  Trouble telling the difference between blue and purple.  Needing frequent changes to your prescription glasses or contacts. How is this diagnosed? This condition is diagnosed with a medical history and eye exam. You may need to see an eye specialist (optometrist or ophthalmologist). Your health care provider may enlarge (dilate) your pupils with eye drops to see the back of your eye more clearly and look for signs of cataracts or other damage. You may also have tests, including:  A visual acuity test. This uses a chart to determine the smallest letters that you can see from a specific distance.  A slit-lamp exam. This uses a microscope to examine small sections of your eye for abnormalities.  Tonometry. This test measures the pressure of the fluid inside your eye. How is this treated? Treatment depends on the stage of your cataract. For an early cataract, vision may improve by using different eyeglasses or stronger lighting. If that does not help your vision, surgery may be recommended to remove the cataract. If your health care provider thinks your cataract may be linked to any medicines that you are taking, he or she may change your medicines. Follow these instructions at home: Lifestyle   Use stronger or brighter lighting.  Consider using a magnifying glass for reading or other activities.  Become familiar with your  surroundings. Having poor vision can put you at a greater risk for tripping, falling, or bumping into things.  Wear sunglasses and a hat if you are sensitive to bright light or are having problems with glare.  Quit smoking if you smoke. If you need help quitting, talk with your health care provider. General instructions   If you are prescribed new eyeglasses, wear them as told by your health care provider.  Take over-the-counter and prescription medicines only as told by your health care provider. Do not change your medicines unless told by your health care provider.  Do not drive or operate heavy machinery if your vision is blurry, particularly at night.  Keep your blood sugar under control, if you have diabetes.  Keep all follow-up visits as told by your health care provider. This is important. Contact a health care provider if:  Your symptoms get worse.  Your vision affects your ability to perform daily activities.  You have new symptoms.  You have a fever. Get help right away if:  You have sudden vision loss.  You have redness, swelling, or increasing pain in your eye.  You develop a headache and sensitivity to light. This information is not intended to replace advice given to you by your health care provider. Make sure you discuss any questions you have with your health care provider. Document Released: 02/08/2005 Document Revised: 06/19/2015 Document Reviewed: 08/14/2014 Elsevier Interactive Patient Education  2017 [employer].

## 2016-06-15 NOTE — H&P (Signed)
The patient was re examined and there is no change in the patients condition since the original H and P. 

## 2016-06-16 ENCOUNTER — Encounter (HOSPITAL_COMMUNITY): Payer: Self-pay | Admitting: Ophthalmology

## 2016-06-20 NOTE — Telephone Encounter (Signed)
Please let patient know the labs he did for [place] on April 17 showed normal hemoglobin. His labs done in preop on 06/11/2016 showed mildly low hemoglobin of 12.7.  Let's recheck a CBC in 6 weeks. If any evidence of anemia we'll go ahead and pursue colonoscopy.

## 2016-06-21 ENCOUNTER — Other Ambulatory Visit: Payer: Self-pay

## 2016-06-21 DIAGNOSIS — D582 Other hemoglobinopathies: Secondary | ICD-10-CM

## 2016-06-21 NOTE — Telephone Encounter (Signed)
PT is aware and Lab order on file for 08/02/2016.

## 2016-06-25 ENCOUNTER — Encounter (HOSPITAL_COMMUNITY): Payer: Self-pay

## 2016-06-25 ENCOUNTER — Encounter (HOSPITAL_COMMUNITY)
Admission: RE | Admit: 2016-06-25 | Discharge: 2016-06-25 | Disposition: A | Discharge status: Home / Self Care | Payer: Medicare Other | Source: Ambulatory Visit | Attending: Ophthalmology | Admitting: Ophthalmology

## 2016-07-01 ENCOUNTER — Encounter: Payer: Self-pay | Admitting: *Deleted

## 2016-07-05 ENCOUNTER — Other Ambulatory Visit: Payer: Self-pay

## 2016-07-07 ENCOUNTER — Other Ambulatory Visit: Payer: Medicare Other | Admitting: *Deleted

## 2016-07-07 ENCOUNTER — Encounter (HOSPITAL_COMMUNITY)
Admission: RE | Admit: 2016-07-07 | Discharge: 2016-07-07 | Disposition: A | Discharge status: Home / Self Care | Payer: Medicare Other | Source: Ambulatory Visit | Attending: Ophthalmology | Admitting: Ophthalmology

## 2016-07-07 ENCOUNTER — Encounter (HOSPITAL_COMMUNITY): Payer: Self-pay

## 2016-07-07 DIAGNOSIS — I10 Essential (primary) hypertension: Secondary | ICD-10-CM

## 2016-07-07 DIAGNOSIS — R079 Chest pain, unspecified: Secondary | ICD-10-CM

## 2016-07-07 DIAGNOSIS — E782 Mixed hyperlipidemia: Secondary | ICD-10-CM

## 2016-07-07 LAB — LIPID PANEL
CHOL/HDL RATIO: 4.7 ratio (ref 0.0–5.0)
Cholesterol, Total: 196 mg/dL (ref 100–199)
HDL: 42 mg/dL (ref >39)
LDL CALC: 116 mg/dL — AB (ref 0–99)
Triglycerides: 192 mg/dL — ABNORMAL HIGH (ref 0–149)
VLDL Cholesterol Cal: 38 mg/dL (ref 5–40)

## 2016-07-07 LAB — ALT: ALT: 13 IU/L (ref 0–44)

## 2016-07-09 ENCOUNTER — Telehealth: Payer: Self-pay | Admitting: *Deleted

## 2016-07-09 ENCOUNTER — Ambulatory Visit: Payer: Self-pay | Admitting: Internal Medicine

## 2016-07-09 MED ORDER — ATORVASTATIN CALCIUM 40 MG PO TABS: 40.0000 mg | ORAL_TABLET | Freq: Every day | ORAL | 1 refills | Status: DC

## 2016-07-09 NOTE — Telephone Encounter (Signed)
Notes recorded by Nelva Bush, MD on 07/09/2016 at 8:12 AM EDT Please let Dalton [name] noted that his LDL (bad cholesterol) remains elevated at 116 (: Less than 70

## 2016-07-12 ENCOUNTER — Other Ambulatory Visit: Payer: Self-pay

## 2016-07-12 DIAGNOSIS — D582 Other hemoglobinopathies: Secondary | ICD-10-CM

## 2016-07-13 ENCOUNTER — Encounter (HOSPITAL_COMMUNITY): Payer: Self-pay

## 2016-07-13 ENCOUNTER — Ambulatory Visit (HOSPITAL_COMMUNITY): Payer: Medicare Other | Admitting: Anesthesiology

## 2016-07-13 ENCOUNTER — Encounter (HOSPITAL_COMMUNITY)
Admission: RE | Disposition: A | Discharge status: Home / Self Care | Payer: Self-pay | Source: Ambulatory Visit | Attending: Ophthalmology

## 2016-07-13 ENCOUNTER — Ambulatory Visit (HOSPITAL_COMMUNITY)
Admission: RE | Admit: 2016-07-13 | Discharge: 2016-07-13 | Disposition: A | Discharge status: Home / Self Care | Payer: Medicare Other | Source: Ambulatory Visit | Attending: Ophthalmology | Admitting: Ophthalmology

## 2016-07-13 DIAGNOSIS — H25012 Cortical age-related cataract, left eye: Secondary | ICD-10-CM | POA: Diagnosis not present

## 2016-07-13 DIAGNOSIS — K279 Peptic ulcer, site unspecified, unspecified as acute or chronic, without hemorrhage or perforation: Secondary | ICD-10-CM | POA: Insufficient documentation

## 2016-07-13 DIAGNOSIS — R51 Headache: Secondary | ICD-10-CM | POA: Insufficient documentation

## 2016-07-13 DIAGNOSIS — M1990 Unspecified osteoarthritis, unspecified site: Secondary | ICD-10-CM | POA: Diagnosis not present

## 2016-07-13 DIAGNOSIS — Z955 Presence of coronary angioplasty implant and graft: Secondary | ICD-10-CM | POA: Diagnosis not present

## 2016-07-13 DIAGNOSIS — E118 Type 2 diabetes mellitus with unspecified complications: Secondary | ICD-10-CM | POA: Insufficient documentation

## 2016-07-13 DIAGNOSIS — H269 Unspecified cataract: Secondary | ICD-10-CM | POA: Diagnosis present

## 2016-07-13 DIAGNOSIS — K219 Gastro-esophageal reflux disease without esophagitis: Secondary | ICD-10-CM | POA: Diagnosis not present

## 2016-07-13 DIAGNOSIS — I252 Old myocardial infarction: Secondary | ICD-10-CM | POA: Insufficient documentation

## 2016-07-13 DIAGNOSIS — G8929 Other chronic pain: Secondary | ICD-10-CM | POA: Insufficient documentation

## 2016-07-13 DIAGNOSIS — R569 Unspecified convulsions: Secondary | ICD-10-CM | POA: Diagnosis not present

## 2016-07-13 DIAGNOSIS — I110 Hypertensive heart disease with heart failure: Secondary | ICD-10-CM | POA: Insufficient documentation

## 2016-07-13 DIAGNOSIS — I509 Heart failure, unspecified: Secondary | ICD-10-CM | POA: Insufficient documentation

## 2016-07-13 DIAGNOSIS — Z951 Presence of aortocoronary bypass graft: Secondary | ICD-10-CM | POA: Diagnosis not present

## 2016-07-13 DIAGNOSIS — H2512 Age-related nuclear cataract, left eye: Secondary | ICD-10-CM | POA: Insufficient documentation

## 2016-07-13 DIAGNOSIS — F418 Other specified anxiety disorders: Secondary | ICD-10-CM | POA: Insufficient documentation

## 2016-07-13 DIAGNOSIS — Z888 Allergy status to other drugs, medicaments and biological substances status: Secondary | ICD-10-CM | POA: Diagnosis not present

## 2016-07-13 DIAGNOSIS — R0602 Shortness of breath: Secondary | ICD-10-CM | POA: Diagnosis not present

## 2016-07-13 DIAGNOSIS — M545 Low back pain: Secondary | ICD-10-CM | POA: Diagnosis not present

## 2016-07-13 DIAGNOSIS — F319 Bipolar disorder, unspecified: Secondary | ICD-10-CM | POA: Diagnosis not present

## 2016-07-13 HISTORY — PX: CATARACT EXTRACTION W/PHACO: SHX586

## 2016-07-13 LAB — GLUCOSE, CAPILLARY: Glucose-Capillary: 89 mg/dL (ref 65–99)

## 2016-07-13 SURGERY — PHACOEMULSIFICATION, CATARACT, WITH IOL INSERTION
Anesthesia: Monitor Anesthesia Care | Site: Eye | Laterality: Left

## 2016-07-13 MED ORDER — PHENYLEPHRINE HCL 2.5 % OP SOLN
1.0000 [drp] | OPHTHALMIC | Status: AC
  Administered 2016-07-13 (×3): 1 [drp] via OPHTHALMIC

## 2016-07-13 MED ORDER — FENTANYL CITRATE (PF) 100 MCG/2ML IJ SOLN
25.0000 ug | INTRAMUSCULAR | Status: DC | PRN
  Administered 2016-07-13: 25 ug via INTRAVENOUS
  Filled 2016-07-13: qty 2

## 2016-07-13 MED ORDER — CYCLOPENTOLATE-PHENYLEPHRINE 0.2-1 % OP SOLN
1.0000 [drp] | OPHTHALMIC | Status: AC
  Administered 2016-07-13 (×3): 1 [drp] via OPHTHALMIC

## 2016-07-13 MED ORDER — MIDAZOLAM HCL 2 MG/2ML IJ SOLN
1.0000 mg | INTRAMUSCULAR | Status: DC | PRN
  Administered 2016-07-13: 2 mg via INTRAVENOUS
  Filled 2016-07-13: qty 2

## 2016-07-13 MED ORDER — EPINEPHRINE PF 1 MG/ML IJ SOLN
INTRAOCULAR | Status: DC | PRN
  Administered 2016-07-13: 500 mL

## 2016-07-13 MED ORDER — BSS IO SOLN
INTRAOCULAR | Status: DC | PRN
  Administered 2016-07-13: 15 mL via INTRAOCULAR

## 2016-07-13 MED ORDER — LACTATED RINGERS IV SOLN
INTRAVENOUS | Status: DC
  Administered 2016-07-13: 09:00:00 via INTRAVENOUS

## 2016-07-13 MED ORDER — PROVISC 10 MG/ML IO SOLN
INTRAOCULAR | Status: DC | PRN
  Administered 2016-07-13: 0.85 mL via INTRAOCULAR

## 2016-07-13 MED ORDER — TETRACAINE HCL 0.5 % OP SOLN
1.0000 [drp] | OPHTHALMIC | Status: AC
  Administered 2016-07-13 (×3): 1 [drp] via OPHTHALMIC

## 2016-07-13 MED ORDER — KETOROLAC TROMETHAMINE 0.5 % OP SOLN
1.0000 [drp] | OPHTHALMIC | Status: AC
  Administered 2016-07-13 (×3): 1 [drp] via OPHTHALMIC

## 2016-07-13 SURGICAL SUPPLY — 10 items
CLOTH BEACON ORANGE TIMEOUT ST (SAFETY) ×2 IMPLANT
EYE SHIELD UNIVERSAL CLEAR (GAUZE/BANDAGES/DRESSINGS) ×2 IMPLANT
GLOVE BIO SURGEON STRL SZ 6.5 (GLOVE) ×1 IMPLANT
GLOVE BIO SURGEONS STRL SZ 6.5 (GLOVE) ×1
GLOVE BIOGEL PI IND STRL 7.0 (GLOVE) IMPLANT
GLOVE BIOGEL PI INDICATOR 7.0 (GLOVE) ×2
LENS ALC ACRYL/TECN (Ophthalmic Related) ×3 IMPLANT
PAD ARMBOARD 7.5X6 YLW CONV (MISCELLANEOUS) ×2 IMPLANT
TAPE TRANSPARENT 1/2IN (GAUZE/BANDAGES/DRESSINGS) ×2 IMPLANT
WATER STERILE IRR 250ML POUR (IV SOLUTION) ×2 IMPLANT

## 2016-07-13 NOTE — Anesthesia Preprocedure Evaluation (Signed)
Anesthesia Evaluation  Patient identified by MRN, date of birth, ID band Patient awake    Reviewed: Allergy & Precautions, H&P , NPO status , Patient's Chart, lab work & pertinent test results, reviewed documented beta blocker date and time   History of Anesthesia Complications Negative for: history of anesthetic complications  Airway Mallampati: III   Neck ROM: Full    Dental  (+) Edentulous Upper, Edentulous Lower   Pulmonary neg pulmonary ROS, shortness of breath,    Pulmonary exam normal        Cardiovascular hypertension, Pt. on medications (-) angina+ CAD (no angina recently), + Past MI, + Cardiac Stents, + CABG and +CHF   Rhythm:Regular Rate:Normal     Neuro/Psych  Headaches, Seizures -, Well Controlled,  PSYCHIATRIC DISORDERS Anxiety Depression Bipolar Disorder    GI/Hepatic PUD, GERD  Medicated and Controlled,  Endo/Other  diabetes, Type 2  Renal/GU      Musculoskeletal  (+) Arthritis  (chronic LBP),   Abdominal   Peds  Hematology   Anesthesia Other Findings   Reproductive/Obstetrics                             Anesthesia Physical Anesthesia Plan  ASA: III  Anesthesia Plan: MAC   Post-op Pain Management:    Induction: Intravenous  Airway Management Planned: Nasal Cannula  Additional Equipment:   Intra-op Plan:   Post-operative Plan:   Informed Consent: I have reviewed the patients History and Physical, chart, labs and discussed the procedure including the risks, benefits and alternatives for the proposed anesthesia with the patient or authorized representative who has indicated his/her understanding and acceptance.     Plan Discussed with:   Anesthesia Plan Comments:         Anesthesia Quick Evaluation

## 2016-07-13 NOTE — Transfer of Care (Signed)
Immediate Anesthesia Transfer of Care Note  Patient: Dalton [name]  Procedure(s) Performed: Procedure(s) with comments: CATARACT EXTRACTION PHACO AND INTRAOCULAR LENS PLACEMENT (IOC) (Left) - CDE: 3.15  Patient Location: Short Stay  Anesthesia Type:MAC  Level of Consciousness: awake  Airway & Oxygen Therapy: Patient Spontanous Breathing  Post-op Assessment: Report given to RN  Post vital signs: Reviewed  Last Vitals:  Vitals:   07/13/16 0901 07/13/16 0905  BP:  114/73  Pulse:    Resp: 16 13  Temp:      Last Pain:  Vitals:   07/13/16 0829  TempSrc:   PainSc: 10-Worst pain ever      Patients Stated Pain Goal: 7 (84/16/60 6301)  Complications: No apparent anesthesia complications

## 2016-07-13 NOTE — Progress Notes (Signed)
IV taken out of right hand per protocol. Catheter intact and patient tolerated well.

## 2016-07-13 NOTE — Discharge Instructions (Signed)
°  °          [facility] Instructions [place] [address]-      1. Avoid closing eyes tightly. One often closes the eye tightly when laughing, talking, sneezing, coughing or if they feel irritated. At these times, you should be careful not to close your eyes tightly.  2. Instill eye drops as instructed. To instill drops in your eye, open it, look up and have someone gently pull the lower lid down and instill a couple of drops inside the lower lid.  3. Do not touch upper lid.  4. Take Advil or Tylenol for pain.  5. You may use either eye for near work, such as reading or sewing and you may watch television.  6. You may have your hair done at the beauty parlor at any time.  7. Wear dark glasses with or without your own glasses if you are in bright light.  8. Call our office at [phone] or [phone] if you have sharp pain in your eye or unusual symptoms.  9.  FOLLOW UP WITH DR. SHAPIRO TODAY IN HIS [place] OFFICE AT 3:00 pm.    I have received a copy of the above instructions and will follow them.     IF YOU ARE IN IMMEDIATE DANGER CALL 911!  It is important for you to keep your follow-up appointment with your physician after discharge, OR, for you /your caregiver to make a follow-up appointment with your physician / medical provider after discharge.  Show these instructions to the next healthcare provider you see.

## 2016-07-13 NOTE — Anesthesia Postprocedure Evaluation (Signed)
Anesthesia Post Note  Patient: Dalton [name]  Procedure(s) Performed: Procedure(s) (LRB): CATARACT EXTRACTION PHACO AND INTRAOCULAR LENS PLACEMENT (IOC) (Left)  Patient location during evaluation: Short Stay Anesthesia Type: MAC Level of consciousness: awake and alert Pain management: pain level not controlled Vital Signs Assessment: post-procedure vital signs reviewed and stable Respiratory status: spontaneous breathing Cardiovascular status: stable Postop Assessment: no signs of nausea or vomiting Anesthetic complications: no     Last Vitals:  Vitals:   07/13/16 0905 07/13/16 0935  BP: 114/73 115/71  Pulse:  66  Resp: 13 13  Temp:  36.7 C    Last Pain:  Vitals:   07/13/16 0935  TempSrc: Oral  PainSc:                  Tressie Stalker

## 2016-07-13 NOTE — H&P (Signed)
The patient was re examined and there is no change in the patients condition since the original H and P. 

## 2016-07-13 NOTE — Op Note (Signed)
Patient brought to the operating room and prepped and draped in the usual manner.  Lid speculum inserted in left eye.  Stab incision made at the twelve o'clock position.  Provisc instilled in the anterior chamber.   A 2.4 mm. Stab incision was made temporally.  An anterior capsulotomy was done with a bent 25 gauge needle.  The nucleus was hydrodissected.  The Phaco tip was inserted in the anterior chamber and the nucleus was emulsified.  CDE was 3.15.  The cortical material was then removed with the I and A tip.  Posterior capsule was the polished.  The anterior chamber was deepened with Provisc.  A 20.5 Diopter Alcon [id number] IOL was then inserted in the capsular bag.  Provisc was then removed with the I and A tip.  The wound was then hydrated.  Patient sent to the Recovery Room in good condition with follow up in my office.  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

## 2016-07-14 ENCOUNTER — Encounter (HOSPITAL_COMMUNITY): Payer: Self-pay | Admitting: Ophthalmology

## 2016-07-16 ENCOUNTER — Ambulatory Visit: Payer: Self-pay | Admitting: Internal Medicine

## 2016-07-21 ENCOUNTER — Encounter: Payer: Self-pay | Admitting: Internal Medicine

## 2016-07-26 ENCOUNTER — Encounter: Payer: Self-pay | Admitting: Gastroenterology

## 2016-08-05 ENCOUNTER — Telehealth: Payer: Self-pay

## 2016-08-05 NOTE — Telephone Encounter (Signed)
Pt is wanting to know what the next step should be since he when to [facility]. Please advise

## 2016-08-09 ENCOUNTER — Encounter: Payer: Self-pay | Admitting: Gastroenterology

## 2016-08-09 NOTE — Telephone Encounter (Signed)
Patient is due for CBC. Please make OV with [facility], nonurgent.  Reviewed esophageal manometry with impedence, normal study. Report to be scanned.

## 2016-08-09 NOTE — Telephone Encounter (Signed)
OV made with SF on 10/20/2016 at 9am and letter mailed

## 2016-08-09 NOTE — Telephone Encounter (Signed)
Please make an appointment for him with [facility].

## 2016-08-10 NOTE — Telephone Encounter (Signed)
Pt is aware and he will go have the blood work done. Orders has been faxed

## 2016-08-12 LAB — CBC WITH DIFFERENTIAL/PLATELET
BASOS ABS: 0 {cells}/uL (ref 0–200)
Basophils Relative: 0 %
EOS PCT: 3 %
Eosinophils Absolute: 111 cells/uL (ref 15–500)
HCT: 38.8 % (ref 38.5–50.0)
HEMOGLOBIN: 13.1 g/dL — AB (ref 13.2–17.1)
LYMPHS ABS: 1554 {cells}/uL (ref 850–3900)
Lymphocytes Relative: 42 %
MCH: 29.2 pg (ref 27.0–33.0)
MCHC: 33.8 g/dL (ref 32.0–36.0)
MCV: 86.4 fL (ref 80.0–100.0)
MPV: 11.1 fL (ref 7.5–12.5)
Monocytes Absolute: 148 cells/uL — ABNORMAL LOW (ref 200–950)
Monocytes Relative: 4 %
NEUTROS ABS: 1887 {cells}/uL (ref 1500–7800)
Neutrophils Relative %: 51 %
Platelets: 209 10*3/uL (ref 140–400)
RBC: 4.49 MIL/uL (ref 4.20–5.80)
RDW: 14 % (ref 11.0–15.0)
WBC: 3.7 10*3/uL — ABNORMAL LOW (ref 3.8–10.8)

## 2016-08-12 NOTE — Progress Notes (Signed)
Please let patient know his Hgb is very slightly below normal. When he f/u with [facility], would consider updating colonoscopy especially if has recurrent rectal bleeding.

## 2016-08-13 NOTE — Progress Notes (Signed)
LMOM to call.

## 2016-08-16 NOTE — Progress Notes (Signed)
Pt is aware.  

## 2016-08-19 NOTE — Telephone Encounter (Signed)
PT is aware and diet mailed to him.

## 2016-08-19 NOTE — Telephone Encounter (Signed)
Forwarding to [facility] for appt.

## 2016-08-19 NOTE — Telephone Encounter (Addendum)
PLEASE CALL PT. DR. [name] REVIEWED IS STUDY. HIS SWALLOWING STUDY/ESOPHAGUS IS NORMAL. HIS BLOOD COUNT IS ESSENTIALLY NORMAL. IF HIS REFLUX/HEARTBURN IS UNCONTROLLED HE WILL STILL HAVE TROUBLE SWALLOWING. HE NEEDS TO STRICTLY FOLLOW A LOW FAT DIET AND AVOID REFLUX TRIGGERS. HE NEEDS TO LOSE 20 LBS.  NEXT OPV IN AUG 2018.    Lifestyle and home remedies TO MANAGE REFLUX/CHEST PAIN  You may eliminate or reduce the frequency of heartburn by making the following lifestyle changes:  . Control your weight. Being overweight is a major risk factor for heartburn and GERD. Excess pounds put pressure on your abdomen, pushing up your stomach and causing acid to back up into your esophagus.   . Eat smaller meals. 4 TO 6 MEALS A DAY. This reduces pressure on the lower esophageal sphincter, helping to prevent the valve from opening and acid from washing back into your esophagus.   Dalton [name] your belt. Clothes that fit tightly around your waist put pressure on your abdomen and the lower esophageal sphincter.   . Eliminate heartburn triggers. Everyone has specific triggers. Common triggers such as fatty or fried foods, spicy food, tomato sauce, carbonated beverages, alcohol, chocolate, mint, garlic, onion, caffeine and nicotine may make heartburn worse.   [name] Kitchen Avoid stooping or bending. Tying your shoes is OK. Bending over for longer periods to weed your garden isn't, especially soon after eating.   . Don't lie down after a meal. Wait at least three to four hours after eating before going to bed, and don't lie down right after eating.   [name] Kitchen PUT THE HEAD OF YOUR BED ON 6 INCH BLOCKS.   Alternative medicine . Several home remedies exist for treating GERD, but they provide only temporary relief. They include drinking baking soda (sodium bicarbonate) added to water or drinking other fluids such as baking soda mixed with cream of tartar and water.  . Although these liquids create temporary relief by neutralizing,  washing away or buffering acids, eventually they aggravate the situation by adding gas and fluid to your stomach, increasing pressure and causing more acid reflux. Further, adding more sodium to your diet may increase your blood pressure and add stress to your heart, and excessive bicarbonate ingestion can alter the acid-base balance in your body.

## 2016-08-19 NOTE — Telephone Encounter (Signed)
LMOM to call.

## 2016-08-19 NOTE — Telephone Encounter (Signed)
PATIENT SCHEDULED  °

## 2016-10-15 ENCOUNTER — Other Ambulatory Visit: Payer: Self-pay | Admitting: Neurosurgery

## 2016-10-19 ENCOUNTER — Ambulatory Visit: Payer: Self-pay | Admitting: Physician Assistant

## 2016-10-20 ENCOUNTER — Encounter: Payer: Self-pay | Admitting: Gastroenterology

## 2016-10-20 ENCOUNTER — Ambulatory Visit (INDEPENDENT_AMBULATORY_CARE_PROVIDER_SITE_OTHER): Payer: Medicare Other | Admitting: Gastroenterology

## 2016-10-20 DIAGNOSIS — B9681 Helicobacter pylori [H. pylori] as the cause of diseases classified elsewhere: Secondary | ICD-10-CM | POA: Diagnosis not present

## 2016-10-20 DIAGNOSIS — R1310 Dysphagia, unspecified: Secondary | ICD-10-CM | POA: Diagnosis not present

## 2016-10-20 DIAGNOSIS — K219 Gastro-esophageal reflux disease without esophagitis: Secondary | ICD-10-CM | POA: Diagnosis not present

## 2016-10-20 DIAGNOSIS — K2970 Gastritis, unspecified, without bleeding: Secondary | ICD-10-CM | POA: Diagnosis not present

## 2016-10-20 DIAGNOSIS — R1319 Other dysphagia: Secondary | ICD-10-CM

## 2016-10-20 DIAGNOSIS — D126 Benign neoplasm of colon, unspecified: Secondary | ICD-10-CM | POA: Diagnosis not present

## 2016-10-20 MED ORDER — RANITIDINE HCL 300 MG PO TABS: ORAL_TABLET | ORAL | 11 refills | Status: DC

## 2016-10-20 NOTE — Assessment & Plan Note (Signed)
NO WARNING SIGNS/SYMPTOMS. REVIEWED LAST TCS NOV 2012.  OVERDUE FOR TCS. PT WILL CALL AND COMPLETE PRIOR TO END OF THE YEAR. HE IS SCHEDULED FOR NECK SURGERY SEP 2018. WILL NEED TO ADJUST COUMADIN.

## 2016-10-20 NOTE — Assessment & Plan Note (Addendum)
SYMPTOMS NOT IDEALLY CONTROLLED and pt taking > recommended dose.  CONTINUE YOUR WEIGHT LOSS EFFORTS. Add ZANTAC WHEN NEED FOR BREAKTHROUGH SYMPTOMS. AVOID TRIGGERS FOR REFLUX.  HANDOUT GIVEN. DEXILANT DAILY. FOLLOW UP IN 6 MOS.

## 2016-10-20 NOTE — Progress Notes (Signed)
CC'ED TO PCP 

## 2016-10-20 NOTE — Assessment & Plan Note (Signed)
SYMPTOMS FAIRLY WELL CONTROLLED.  CONTINUE TO MONITOR SYMPTOMS. 

## 2016-10-20 NOTE — Progress Notes (Signed)
Subjective:    Patient ID: Dalton [name], male    DOB: 11/01/1960, 56 y.o.   MRN: [id number]  Nolene Ebbs, MD  HPI HAVING NECK SURGERY ON SEP 14. LAST PILL HELPS BUT NOT SURE F MEDICAID WILL PAY FOR IT. MAY TAKE 3 INSTEAD OF TWO. WITH FLARES THEN TAKES DM PILLS AND THEN HAS WATERY STOOL. WAS ABLE TO LOSE WEIGHT AND DOWN TO 220 LBS. NOW BACK UP TO 249 LBS. BMs: HARD STOOL: 2X/WEEK. DIARRHEA: 1-2 X/MO. HEARTBURN: 1-2X A WEEK. SOB: SAME  PT DENIES FEVER, CHILLS, HEMATOCHEZIA, HEMATEMESIS, nausea, vomiting, melena, CHEST PAIN, CHANGE IN BOWEL IN HABITS, abdominal pain, OR problems swallowing.  Past Medical History:  Diagnosis Date  . Anemia   . Anxiety   . Bipolar disorder ([place])   . CHF (congestive heart failure) ([place])   . Chronic back pain    Pain Clinic in [place]  . Chronic bronchitis   . Chronic lower back pain   . Congestive heart failure (CHF) ([place])   . Coronary artery disease   . Coughing   . Depression   . DVT (deep venous thrombosis) ([place]) ~ 2005   LLE  . Frequency of urination   . GERD (gastroesophageal reflux disease)   . 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 EHOZYYQM(250.0)    "a few times/week" (12/24/2015)  . High cholesterol   . Hypertension   . Laceration of right hand 11/27/2010  . Laceration of wrist 2007 BIL FOREARMS  . MI (myocardial infarction) ([place])    7, last one was in 2011 (12/24/2015)  . Neuropathy   . NSAID-induced gastric ulcer    "Ibuprofen"  . PUD (peptic ulcer disease)    in 1990s, secondary to medication  . Rheumatoid arthritis ([place])    "all over" (12/24/2015)  . Shortness of breath    with exertion  . Tonsillitis, chronic    Dr. Vicki Mallet in [place]  . Type II diabetes mellitus ([place])     Past Surgical History:  Procedure Laterality Date  . BACK SURGERY     x3  . BIOPSY N/A 05/30/2012   Procedure: BIOPSY;  Surgeon: Danie Binder, MD;  Location: [facility]  ORS;  Service: Endoscopy;  Laterality: N/A;  . BIOPSY  03/02/2016   Procedure: BIOPSY;  Surgeon: Danie Binder, MD;  Location: [facility] ENDO SUITE;  Service: Endoscopy;;  gastric  . CARDIAC CATHETERIZATION  "several"  . CARPAL TUNNEL RELEASE Bilateral   . CATARACT EXTRACTION W/PHACO Right 06/15/2016   Procedure: CATARACT EXTRACTION PHACO AND INTRAOCULAR LENS PLACEMENT (IOC);  Surgeon: Rutherford Guys, MD;  Location: [facility] ORS;  Service: Ophthalmology;  Laterality: Right;  CDE: 4.64  . CATARACT EXTRACTION W/PHACO Left 07/13/2016   Procedure: CATARACT EXTRACTION PHACO AND INTRAOCULAR LENS PLACEMENT (IOC);  Surgeon: Rutherford Guys, MD;  Location: [facility] ORS;  Service: Ophthalmology;  Laterality: Left;  CDE: 3.15  . COLONOSCOPY  12/28/2010   SLF: (MAC)Internal hemorrhoids/four small colon polyps tubular adenomas. per SLF: colonoscopy 2022  . CORONARY ARTERY BYPASS GRAFT  2002   3 vessels  . ESOPHAGOGASTRODUODENOSCOPY N/A 05/30/2012   SLF: UNCONTROLLED GERD DUE TO LIFESTYLE CHOICE/WEIGHT GAIN/MILD Non-erosive gastritis  . ESOPHAGOGASTRODUODENOSCOPY (EGD) WITH PROPOFOL N/A 03/02/2016   Dr. Oneida Alar: Esophagus appeared normal, impaired dilation performed, patchy inflammation with edema and erythema of the entire stomach., Biopsy with H pylori, patient completed Pylera.   [name] Kitchen FRACTURE SURGERY    . LEFT HEART  CATHETERIZATION WITH CORONARY ANGIOGRAM N/A 08/31/2011   Procedure: LEFT HEART CATHETERIZATION WITH CORONARY ANGIOGRAM;  Surgeon: Laverda Page, MD;  Location[facility] CATH LAB;  Service: Cardiovascular;  Laterality: N/A;  . LUMBAR DISC SURGERY     "L4-5; Dr. Trenton Gammon"  . ORIF MANDIBULAR FRACTURE N/A 12/19/2015   Procedure: OPEN REDUCTION INTERNAL FIXATION (ORIF) MANDIBULAR FRACTURE;  Surgeon: Izora Gala, MD;  Location: [facility];  Service: ENT;  Laterality: N/A;  . PATELLA FRACTURE SURGERY Left 1976   plate to knee cap from accident  . SAVORY DILATION  12/28/2010   SLF:(MAC)J-shaped stomach/nodular mocosa in the distal  esophagus/empiric dilation 86m  . SAVORY DILATION N/A 03/02/2016   Procedure: SAVORY DILATION;  Surgeon: SDanie Binder MD;  Location: [facility] END[facility] SU[facility];  Service: Endoscopy;  Laterality: N/A;    Allergies  Allergen Reactions  . Gabapentin Hives  . Ibuprofen Other (See Comments)    REACTION:Stomach  Upset and stomach ulcers  . Zolpidem Tartrate Other (See Comments)    REACTION: Hallucinations   . Naproxen Other (See Comments)    HALLUCINATIONS    Current Outpatient Prescriptions  Medication Sig Dispense Refill  . albuterol (PROVENTIL HFA;VENTOLIN HFA) 108 (90 Base) MCG/ACT inhaler Inhale 1-2 puffs into the lungs every 6 (six) hours as needed for wheezing or shortness of breath.    . alprazolam (XANAX) 2 MG tablet Take 2 mg by mouth 2 (two) times daily.    .[name] Kitchenaspirin EC 325 MG tablet Take 81 mg by mouth at bedtime.     .[name] Kitchenatorvastatin (LIPITOR) 40 MG tablet Take 1 tablet (40 mg total) by mouth daily. 90 tablet 1  . beclomethasone (QVAR) 80 MCG/ACT inhaler Inhale 2 puffs into the lungs 2 (two) times daily.    . cetirizine (ZYRTEC) 10 MG tablet Take 10 mg by mouth daily as needed for allergies.   0  . clopidogrel (PLAVIX) 75 MG tablet Take 75 mg by mouth daily.    .[name] Kitchendexlansoprazole (DEXILANT) 60 MG capsule Take 1 capsule (60 mg total) by mouth daily. 30 capsule 5  .      . fluticasone (FLONASE) 50 MCG/ACT nasal spray Place 2 sprays into both nostrils daily.    . furosemide (LASIX) 40 MG tablet Take 40 mg by mouth daily as needed.    .[name] KitchenglipiZIDE (GLUCOTROL) 10 MG tablet Take 10 mg by mouth daily before breakfast.    . hydrochlorothiazide (HYDRODIURIL) 25 MG tablet Take 1 tablet (25 mg total) by mouth daily. 30 tablet 0  . ipratropium (ATROVENT) 0.02 % nebulizer solution Take 0.5 mg by nebulization 2 (two) times daily as needed (for congestion).    . isosorbide mononitrate (IMDUR) 30 MG 24 hr tablet Take 1 tablet (30 mg total) by mouth daily. 90 tablet 1  . lisinopril (PRINIVIL,ZESTRIL) 10 MG  tablet Take 10 mg by mouth daily.    .[name] KitchenLYRICA 100 MG capsule Take 100 mg by mouth 2 (two) times daily.  0  . meclizine (ANTIVERT) 25 MG tablet Take 25 mg by mouth 3 (three) times daily as needed for dizziness.    . metFORMIN (GLUCOPHAGE) 500 MG tablet Take 1 tablet (500 mg total) by mouth daily with breakfast. (Patient taking differently: Take 500 mg by mouth 2 (two) times daily with a meal. ) 30 tablet 0  . metoprolol tartrate (LOPRESSOR) 25 MG tablet Take 0.5 tablets (12.5 mg total) by mouth 2 (two) times daily. 30 tablet 0  . nitroGLYCERIN (NITROSTAT) 0.4 MG SL tablet Place  0.4 mg under the tongue every 5 (five) minutes as needed for chest pain.    [name] Kitchen oxyCODONE-acetaminophen (PERCOCET) 10-325 MG tablet Take 1 tablet by mouth every 4 (four) hours as needed for pain.    . potassium chloride (K-DUR,KLOR-CON) 10 MEQ tablet Take 1 tablet (10 mEq total) by mouth daily. 30 tablet 0  . sertraline (ZOLOFT) 100 MG tablet Take 100 mg by mouth daily.    . sitaGLIPtin (JANUVIA) 100 MG tablet Take 100 mg by mouth daily.    . tamsulosin (FLOMAX) 0.4 MG CAPS capsule Take 1 capsule (0.4 mg total) by mouth daily after breakfast. 30 capsule 0  . warfarin (COUMADIN) 1 MG tablet Take 4 mg by mouth daily.     Review of Systems PER HPI OTHERWISE ALL SYSTEMS ARE NEGATIVE.    Objective:   Physical Exam  Constitutional: He is oriented to person, place, and time. He appears well-developed and well-nourished. No distress.  HENT:  Head: Normocephalic and atraumatic.  Mouth/Throat: Oropharynx is clear and moist. No oropharyngeal exudate.  Eyes: Pupils are equal, round, and reactive to light. No scleral icterus.  Neck: Normal range of motion. Neck supple.  Cardiovascular: Normal rate, regular rhythm and normal heart sounds.   Pulmonary/Chest: Effort normal and breath sounds normal. No respiratory distress.  Abdominal: Soft. Bowel sounds are normal. He exhibits no distension. There is no tenderness.  Musculoskeletal:  He exhibits no edema.  Lymphadenopathy:    He has no cervical adenopathy.  Neurological: He is alert and oriented to person, place, and time.  Psychiatric: He has a normal mood and affect.  Vitals reviewed.         Assessment & Plan:

## 2016-10-20 NOTE — Progress Notes (Signed)
ON RECALL  °

## 2016-10-20 NOTE — Patient Instructions (Signed)
CONTINUE YOUR WEIGHT LOSS EFFORTS.  Add ZANTAC WHEN NEED FOR BREAKTHROUGH SYMPTOMS.  AVOID TRIGGERS FOR REFLUX.  SEE INFO BELOW.  CONTINUE DEXILANT ONCE DAILY.  CALL BEFORE THE END OF THE YEAR TO COMPLETE YOUR COLONOSCOPY.  FOLLOW UP IN 6 MOS.    Lifestyle and home remedies TO MANAGE REFLUX/CHEST PAIN  You may eliminate or reduce the frequency of heartburn by making the following lifestyle changes:  . Control your weight. Being overweight is a major risk factor for heartburn and GERD. Excess pounds put pressure on your abdomen, pushing up your stomach and causing acid to back up into your esophagus.   . Eat smaller meals. 4 TO 6 MEALS A DAY. This reduces pressure on the lower esophageal sphincter, helping to prevent the valve from opening and acid from washing back into your esophagus.   Dolphus Jenny your belt. Clothes that fit tightly around your waist put pressure on your abdomen and the lower esophageal sphincter.   . Eliminate heartburn triggers. Everyone has specific triggers. Common triggers such as fatty or fried foods, spicy food, tomato sauce, carbonated beverages, alcohol, chocolate, mint, garlic, onion, caffeine and nicotine may make heartburn worse.   [name] Kitchen Avoid stooping or bending. Tying your shoes is OK. Bending over for longer periods to weed your garden isn't, especially soon after eating.   . Don't lie down after a meal. Wait at least three to four hours after eating before going to bed, and don't lie down right after eating.   [name] Kitchen PUT THE HEAD OF YOUR BED ON 6 INCH BLOCKS.   Alternative medicine . Several home remedies exist for treating GERD, but they provide only temporary relief. They include drinking baking soda (sodium bicarbonate) added to water or drinking other fluids such as baking soda mixed with cream of tartar and water.  . Although these liquids create temporary relief by neutralizing, washing away or buffering acids, eventually they aggravate the situation  by adding gas and fluid to your stomach, increasing pressure and causing more acid reflux. Further, adding more sodium to your diet may increase your blood pressure and add stress to your heart, and excessive bicarbonate ingestion can alter the acid-base balance in your body.

## 2016-10-20 NOTE — Assessment & Plan Note (Signed)
PT NEEDS CONFIRMATION OF ERADICATION.

## 2016-10-23 ENCOUNTER — Other Ambulatory Visit: Payer: Self-pay | Admitting: Gastroenterology

## 2016-10-26 ENCOUNTER — Telehealth: Payer: Self-pay | Admitting: *Deleted

## 2016-10-26 ENCOUNTER — Ambulatory Visit (INDEPENDENT_AMBULATORY_CARE_PROVIDER_SITE_OTHER): Payer: Medicare Other | Admitting: Physician Assistant

## 2016-10-26 ENCOUNTER — Encounter: Payer: Self-pay | Admitting: Physician Assistant

## 2016-10-26 VITALS — BP 120/74 | HR 65 | Ht 70.0 in | Wt 259.0 lb

## 2016-10-26 DIAGNOSIS — I2510 Atherosclerotic heart disease of native coronary artery without angina pectoris: Secondary | ICD-10-CM

## 2016-10-26 DIAGNOSIS — I10 Essential (primary) hypertension: Secondary | ICD-10-CM

## 2016-10-26 DIAGNOSIS — I739 Peripheral vascular disease, unspecified: Secondary | ICD-10-CM | POA: Diagnosis not present

## 2016-10-26 DIAGNOSIS — E782 Mixed hyperlipidemia: Secondary | ICD-10-CM

## 2016-10-26 DIAGNOSIS — Z01810 Encounter for preprocedural cardiovascular examination: Secondary | ICD-10-CM | POA: Diagnosis not present

## 2016-10-26 NOTE — Progress Notes (Addendum)
Cardiology Office Note:    Date:  10/26/2016   ID:  Dalton [name], DOB 1960-04-12, MRN [id number]  PCP:  Dalton Ebbs, MD  Cardiologist:  Dr. Nelva [name]    Referring MD: Dalton Larsson, MD   Chief Complaint  Patient presents with  . Surgical Clearance    C3-C4 ANTERIOR CERVICAL FUSION ON 11/05/16 WITH Dalton [name]    History of Present Illness:    Dalton [name] is a 56 y.o. male with a hx of CAD status post single-vessel CABG (LIMA-LAD), multiple prior myocardial infarctions, cardiomyopathy, prior DVT, HTN, HL, diabetes, rheumatoid arthritis, PUD, bipolar disorder, cocaine abuse who is referred today by Dalton Larsson, MD for surgical clearance.  The patient underwent CABG in 2000.  A Cardiac catheterization in 7/13 demonstrated normal coronary arteries with an atretic LIMA and patent left subclavian stent. Nuclear stress test in 11/17 was low risk with no ischemia or infarction. He was evaluated by Dalton [name] in 2/18 for chest pain and shortness of breath. Given the results of his prior cardiac catheterization in 2013 and negative stress test in 2017, no further testing was recommended. He was placed on nitrates to cover for the possibility of microvascular dysfunction/spasm.  Dalton [name] is here alone today.  There is a lot of confusion regarding his medications today. He had Coumadin listed on his medications. He is not sure why he is on it and it does not sound like anyone is checking his INR.  I searched through his chart and do not see any indications for it.  He is not sure that he is taking it.  Dalton [name] stopped his Plavix at last visit.  He tells me that he has chronic chest pain with exertion.  This is overall improved.  He does not take NTG for it.  He has chronic dyspnea on exertion that is also unchanged.  He denies syncope, paroxysmal nocturnal dyspnea, edema.  He is not smoking and denies any cocaine use.    Prior CV studies:   The following studies were reviewed  today:  Nuclear stress test 12/25/15 IMPRESSION: 1. No reversible ischemia or infarction. 2. Normal left ventricular wall motion. 3. Left ventricular ejection fraction is 54%. 4. Non invasive risk stratification*: Low  Echocardiogram 11/14/15 EF 55-60, no RWMA, Gr 2 DD, trivial AI, mild PI  Cardiac Catheterization 08/31/11 RCA normal.  LM normal.  LCx normal.  LAD normal.  EF 55-60%.  L subclavian arteriogram:  stent is widely patent. Left internal mammary artery is atretic.  Cardiac Catheterization 9/10 LMCA normal. LAD with midvessel bridge and 40% stenosis in systole. Ostial LAD with 30% stenosis. LCx with mild luminal irregularities. RCA with mild luminal irregularities. Patent left subclavian artery stent. LIMA -> LAD proximally occluded.  Past Medical History:  Diagnosis Date  . Anemia   . Anxiety   . Bipolar disorder ([place])   . CHF (congestive heart failure) ([place])   . Chronic back pain    Pain Clinic in [place]  . Chronic bronchitis   . Chronic lower back pain   . Congestive heart failure (CHF) ([place])   . Coronary artery disease   . Coughing   . Depression   . DVT (deep venous thrombosis) ([place]) ~ 2005   LLE  . Frequency of urination   . GERD (gastroesophageal reflux disease)   . 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 Headache(784.0)    "a  few times/week" (12/24/2015)  . High cholesterol   . Hypertension   . Laceration of right hand 11/27/2010  . Laceration of wrist 2007 BIL FOREARMS  . MI (myocardial infarction) ([place])    7, last one was in 2011 (12/24/2015)  . Neuropathy   . NSAID-induced gastric ulcer    "Ibuprofen"  . PUD (peptic ulcer disease)    in 1990s, secondary to medication  . Rheumatoid arthritis ([place])    "all over" (12/24/2015)  . Shortness of breath    with exertion  . Tonsillitis, chronic    Dr. Vicki [name] in [place]  . Type II diabetes mellitus ([place])     Past  Surgical History:  Procedure Laterality Date  . BACK SURGERY     x3  . BIOPSY N/A 05/30/2012   Procedure: BIOPSY;  Surgeon: Dalton Binder, MD;  Location: [facility] ORS;  Service: Endoscopy;  Laterality: N/A;  . BIOPSY  03/02/2016   Procedure: BIOPSY;  Surgeon: Dalton Binder, MD;  Location: [facility] ENDO SUITE;  Service: Endoscopy;;  gastric  . CARDIAC CATHETERIZATION  "several"  . CARPAL TUNNEL RELEASE Bilateral   . CATARACT EXTRACTION W/PHACO Right 06/15/2016   Procedure: CATARACT EXTRACTION PHACO AND INTRAOCULAR LENS PLACEMENT (IOC);  Surgeon: Dalton Guys, MD;  Location: [facility] ORS;  Service: Ophthalmology;  Laterality: Right;  CDE: 4.64  . CATARACT EXTRACTION W/PHACO Left 07/13/2016   Procedure: CATARACT EXTRACTION PHACO AND INTRAOCULAR LENS PLACEMENT (IOC);  Surgeon: Dalton Guys, MD;  Location: [facility] ORS;  Service: Ophthalmology;  Laterality: Left;  CDE: 3.15  . COLONOSCOPY  12/28/2010   SLF: (MAC)Internal hemorrhoids/four small colon polyps tubular adenomas. per SLF: colonoscopy 2022  . CORONARY ARTERY BYPASS GRAFT  2002   3 vessels  . ESOPHAGOGASTRODUODENOSCOPY N/A 05/30/2012   SLF: UNCONTROLLED GERD DUE TO LIFESTYLE CHOICE/WEIGHT GAIN/MILD Non-erosive gastritis  . ESOPHAGOGASTRODUODENOSCOPY (EGD) WITH PROPOFOL N/A 03/02/2016   Dr. Oneida [name]: Esophagus appeared normal, impaired dilation performed, patchy inflammation with edema and erythema of the entire stomach., Biopsy with H pylori, patient completed Pylera.   [name] Kitchen FRACTURE SURGERY    . LEFT HEART CATHETERIZATION WITH CORONARY ANGIOGRAM N/A 08/31/2011   Procedure: LEFT HEART CATHETERIZATION WITH CORONARY ANGIOGRAM;  Surgeon: Dalton Page, MD;  Location: [facility] CATH LAB;  Service: Cardiovascular;  Laterality: N/A;  . LUMBAR DISC SURGERY     "L4-5; Dr. Trenton [name]"  . ORIF MANDIBULAR FRACTURE N/A 12/19/2015   Procedure: OPEN REDUCTION INTERNAL FIXATION (ORIF) MANDIBULAR FRACTURE;  Surgeon: Dalton Gala, MD;  Location: [place];  Service: ENT;  Laterality: N/A;  . PATELLA  FRACTURE SURGERY Left 1976   plate to knee cap from accident  . SAVORY DILATION  12/28/2010   SLF:(MAC)J-shaped stomach/nodular mocosa in the distal esophagus/empiric dilation 10m  . SAVORY DILATION N/A 03/02/2016   Procedure: SAVORY DILATION;  Surgeon: SDanie Binder MD;  [facility];  Service: Endoscopy;  Laterality: N/A;    Current Medications: Current Meds  Medication Sig  . albuterol (PROVENTIL HFA;VENTOLIN HFA) 108 (90 Base) MCG/ACT inhaler Inhale 1-2 puffs into the lungs every 6 (six) hours as needed for wheezing or shortness of breath.  . alprazolam (XANAX) 2 MG tablet Take 2 mg by mouth 2 (two) times daily.  .[name] Kitchenaspirin EC 81 MG tablet Take 81 mg by mouth at bedtime.  .[name] Kitchenatorvastatin (LIPITOR) 40 MG tablet Take 1 tablet (40 mg total) by mouth daily.  . beclomethasone (QVAR) 80 MCG/ACT inhaler Inhale 2 puffs into the lungs 2 (two) times daily.  .[name] Kitchen  cetirizine (ZYRTEC) 10 MG tablet Take 10 mg by mouth daily as needed for allergies.   [name] Kitchen dexlansoprazole (DEXILANT) 60 MG capsule Take 1 capsule (60 mg total) by mouth daily.  . ergocalciferol (VITAMIN D2) 50000 units capsule Take 50,000 Units by mouth every 30 (thirty) days.  . fluticasone (FLONASE) 50 MCG/ACT nasal spray Place 2 sprays into both nostrils daily.  . furosemide (LASIX) 40 MG tablet Take 40 mg by mouth daily as needed.  [name] Kitchen glipiZIDE (GLUCOTROL) 10 MG tablet Take 10 mg by mouth daily before breakfast.  . hydrochlorothiazide (HYDRODIURIL) 25 MG tablet Take 1 tablet (25 mg total) by mouth daily.  [name] Kitchen ipratropium (ATROVENT) 0.02 % nebulizer solution Take 0.5 mg by nebulization 2 (two) times daily as needed (for congestion).  . isosorbide mononitrate (IMDUR) 30 MG 24 hr tablet Take 1 tablet (30 mg total) by mouth daily.  [name] Kitchen lisinopril (PRINIVIL,ZESTRIL) 10 MG tablet Take 10 mg by mouth daily.  [name] Kitchen LYRICA 100 MG capsule Take 100 mg by mouth 2 (two) times daily.  . meclizine (ANTIVERT) 25 MG tablet Take 25 mg by mouth 3 (three)  times daily as needed for dizziness.  . metFORMIN (GLUCOPHAGE) 500 MG tablet Take 1 tablet (500 mg total) by mouth daily with breakfast. (Patient taking differently: Take 500 mg by mouth 2 (two) times daily with a meal. )  . metoprolol tartrate (LOPRESSOR) 25 MG tablet Take 0.5 tablets (12.5 mg total) by mouth 2 (two) times daily.  . nitroGLYCERIN (NITROSTAT) 0.4 MG SL tablet Place 0.4 mg under the tongue every 5 (five) minutes as needed for chest pain.  [name] Kitchen oxyCODONE-acetaminophen (PERCOCET) 10-325 MG tablet Take 1 tablet by mouth every 4 (four) hours as needed for pain.  . potassium chloride (K-DUR,KLOR-CON) 10 MEQ tablet Take 1 tablet (10 mEq total) by mouth daily.  . sertraline (ZOLOFT) 100 MG tablet Take 100 mg by mouth daily.  . sitaGLIPtin (JANUVIA) 100 MG tablet Take 100 mg by mouth daily.  . tamsulosin (FLOMAX) 0.4 MG CAPS capsule Take 1 capsule (0.4 mg total) by mouth daily after breakfast.  . [DISCONTINUED] clopidogrel (PLAVIX) 75 MG tablet Take 75 mg by mouth daily.  . [DISCONTINUED] ranitidine (ZANTAC) 300 MG tablet 1 PO DAILY WHEN NEED TO CONTROL HEARTBURN  . [DISCONTINUED] warfarin (COUMADIN) 1 MG tablet Take 4 mg by mouth daily.     Allergies:   Gabapentin; Ibuprofen; Zolpidem tartrate; and Naproxen   Social History   Social History  . Marital status: Single    Spouse name: N/A  . Number of children: 2  . Years of education: N/A   Occupational History  . disabled Not Employed   Social History Main Topics  . Smoking status: Never Smoker  . Smokeless tobacco: Never Used  . Alcohol use No  . Drug use: No     Comment: clean x6 years  . Sexual activity: Not Currently   Other Topics Concern  . None   Social History Narrative   Lives w/ son-23/22     Family Hx: The patient's family history includes Diabetes in his father and mother; Heart attack in his brother, sister, and unknown relative; Heart attack (age of onset: 40) in his father; Heart attack (age of onset:  45) in his mother; Heart failure in his other; Hypertension in his father and mother; Seizures in his brother. There is no history of Colon cancer, Liver disease, Anesthesia problems, Hypotension, Malignant hyperthermia, Pseudochol deficiency, or Colon polyps.  ROS:   Please see  the history of present illness.    ROS All other systems reviewed and are negative.   EKGs/Labs/Other Test Reviewed:    EKG:  EKG is  ordered today.  The ekg ordered today demonstrates NSR, HR 65, normal axis, QTc 409 ms  Recent Labs: 11/16/2015: Magnesium 1.8 12/16/2015: TSH 2.759 06/11/2016: BUN 10; Creatinine, Ser 0.94; Potassium 3.5; Sodium 141 07/07/2016: ALT 13 08/12/2016: Hemoglobin 13.1; Platelets 209   Recent Lipid Panel Lab Results  Component Value Date/Time   CHOL 196 07/07/2016 11:09 AM   TRIG 192 (H) 07/07/2016 11:09 AM   HDL 42 07/07/2016 11:09 AM   CHOLHDL 4.7 07/07/2016 11:09 AM   CHOLHDL 6.4 12/16/2015 06:30 AM   LDLCALC 116 (H) 07/07/2016 11:09 AM    Physical Exam:    VS:  BP 120/74   Pulse 65   Ht _0  (1.778 m)   Wt 259 lb (117.5 kg)   SpO2 96%   BMI 37.16 kg/m     Wt Readings from Last 3 Encounters:  10/26/16 259 lb (117.5 kg)  10/20/16 249 lb 3.2 oz (113 kg)  07/13/16 240 lb (108.9 kg)     Physical Exam  Constitutional: He is oriented to person, place, and time. He appears well-developed and well-nourished. No distress.  HENT:  Head: Normocephalic and atraumatic.  Eyes: No scleral icterus.  Neck: No JVD present.  Cardiovascular: Normal rate and regular rhythm.   No murmur heard. Pulmonary/Chest: Effort normal. He has no rales.  Abdominal: Soft.  Musculoskeletal: He exhibits no edema.  Neurological: He is alert and oriented to person, place, and time.  Skin: Skin is warm and dry.  Psychiatric: He has a normal mood and affect.    ASSESSMENT:    1. Preoperative cardiovascular examination   2. Coronary artery disease involving native coronary artery of native  heart without angina pectoris   3. Essential hypertension, benign   4. Mixed hyperlipidemia   5. PAD (peripheral artery disease) (HCC)    PLAN:    In order of problems listed above:  1. Preoperative cardiovascular examination He has a hx of CABG in 2000.  However, his Cardiac Catheterization in 2013 demonstrated normal coronary arteries and his Nuclear stress test in 2017 was low risk and neg for ischemia.  He has chronic chest pain with exertion and Dalton [name] placed him on long acting Nitrates at his last visit to cover for microvascular ischemia.  The patient overall feels that his chest pain is improved.  His ECG is normal.  According to the Revised Cardiac Risk Index, his risk of major cardiac complications during surgery is elevated at 6.6%.  However, according to the Duke Activity Status Index, he is able to achieve 4.62 METs.  Therefore, according to ACC/AHA guidelines, he does not require further cardiac testing and may proceed with his non-cardiac surgery at acceptable risk.  Our service is available as necessary.  He may hold his ASA 7 days prior to his surgery and resume post op when safe.    2. Coronary artery disease involving native coronary artery of native heart without angina pectoris  He has stable chest pain.  Continue ASA, statin, nitrates, beta-blocker.  He should be off Plavix now.  I have also asked him to review his meds at home to see if he is taking Coumadin.  If so, he will need to contact that provider and his neurosurgeon.    3. Essential hypertension, benign The patient's blood pressure is controlled on his current  regimen.  Continue current therapy.   4. Mixed hyperlipidemia Continue statin.  5. PAD (peripheral artery disease) ([place]) S/p L subclavian stent.  Patent at Cardiac Catheterization in 2013.   Addendum 10/26/2016 5:05 PM - Patient called back after leaving the office.  He has been taking Plavix and we asked him to stop it.  He is NOT taking Coumadin.    Dispo:  Return in about 6 months (around 04/25/2017) for Routine Follow Up, w/ Dalton [name].   Medication Adjustments/Labs and Tests Ordered: Current medicines are reviewed at length with the patient today.  Concerns regarding medicines are outlined above.  Tests Ordered: Orders Placed This Encounter  Procedures  . EKG 12-Lead   Medication Changes: No orders of the defined types were placed in this encounter.   Signed, Richardson Dopp, PA-C  10/26/2016 12:55 PM    [facility] [place], [place], [place]  [address] Phone: [phone]; Fax: [phone]

## 2016-10-26 NOTE — Patient Instructions (Addendum)
Medication Instructions:  You can hold your Aspirin 7 days prior to your surgery and resume it after your surgery once Dr. Annette Stable tells you it is ok.  You should not be on Plavix (Clopidogrel).  If you are taking it, stop.  We have Coumadin (Warfarin) showing up on your medication list.  You need to check your medications to see if you are taking this.  If your are, you need to call the provider that is prescribing it to find out why and to get further instructions regarding what to do prior to your surgery. Plus, you will need to follow up with that provider to have your Coumadin levels checked.  Also, if you are taking Coumadin (Warfarin) you need to let Dr. Annette Stable know as soon as possible.     WHEN YOU GET HOME TODAY PLEASE CHECK YOUR MEDICATIONS AGAINST YOUR LIST GIVEN TO YOU TODAY AND CALL THE OFFICE SO THAT WE MAY GO OVER YOUR MEDICATIONS TO MAKE SURE WE HAVE YOUR MEDICATIONS ON FILE CORRECTLY. [phone]  Labwork: None   Testing/Procedures: None   Follow-Up: Dr. Harrell Gave End in 6 months. Our office will send out a reminder letter in a few months asking for you to call and make an appt   Any Other Special Instructions Will Be Listed Below (If Applicable).  If you need a refill on your cardiac medications before your next appointment, please call your pharmacy.

## 2016-10-26 NOTE — Telephone Encounter (Signed)
Pt was instructed at [facility] today to call when he got home to go over his medications, since pt was not sure of his medications. Ptcb and states to me that he had still been taking Plavix, though he has not been taking Warfarin. I advised pt to stop taking Plavix as to that Dr. Saunders Revel had previously d/c'd Plavix. I thanked pt for the call back to make sure we had his medications correct. Pt asked me if we would let Dr. Delfina Redwood know as well. Med list has been updated and a new AVS has been sent out to the pt. I verified pt's address.

## 2016-10-26 NOTE — Telephone Encounter (Signed)
[place], [place]    10/26/2016 5:06 PM

## 2016-10-27 ENCOUNTER — Encounter (HOSPITAL_COMMUNITY): Payer: Self-pay

## 2016-10-27 NOTE — Pre-Procedure Instructions (Signed)
Dalton [name]  10/27/2016      [facility] [address], [place] [address] Phone: [phone] Fax: [phone]  [facility] [address] [place] [address] Phone: [phone] Fax: [phone]  [facility] [place], [place] [address] [place] [address] Phone: [phone] Fax: [phone]  [facility] [place], [place] [address] [place] [address] Phone: [phone] Fax: [phone]    Your procedure is scheduled on Friday, November 05, 2016.  Report to [facility] Admitting at 6:00 A.M.  Call this number if you have problems the morning of surgery:  [phone]   Remember:  Do not eat food or drink liquids after midnight Thursday, November 04, 2016  Take these medicines the morning of surgery with A SIP OF WATER : alprazolam (XANAX),dexlansoprazole (DEXILANT), isosorbide mononitrate (IMDUR), metoprolol tartrate (LOPRESSOR), sertraline (ZOLOFT), tamsulosin (FLOMAX), beclomethasone (QVAR) inhaler,  If needed: cetirizine (ZYRTEC) for allergies, meclizine (ANTIVERT) for dizziness, Oxycodone  OR Lyrica for pain, nitroGLYCERIN (NITROSTAT) for chest pain, fluticasone (FLONASE) nasal spray for allergies, ipratropium (ATROVENT) nebulizer solution for congestion, albuterol (PROVENTIL HFA;VENTOLIN HFA)  inhaler for wheezing or shortness of breath ( Bring inhaler in with you on day of surgery) Stop taking Aspirin, vitamins, fish oil and herbal medications. Do not take any NSAIDs ie: Ibuprofen, Advil, Naproxen (Aleve), Motrin, BC and Goody Powder or any medication containing Aspirin; stop Friday, October 29, 2016.    How to Manage Your Diabetes Before and After Surgery  Why is it important to control my blood sugar before and after  surgery? . Improving blood sugar levels before and after surgery helps healing and can limit problems. . A way of improving blood sugar control is eating a healthy diet by: o  Eating less sugar and carbohydrates o  Increasing activity/exercise o  Talking with your doctor about reaching your blood sugar goals . High blood sugars (greater than 180 mg/dL) can raise your risk of infections and slow your recovery, so you will need to focus on controlling your diabetes during the weeks before surgery. . Make sure that the doctor who takes care of your diabetes knows about your planned surgery including the date and location.  How do I manage my blood sugar before surgery? . Check your blood sugar at least 4 times a day, starting 2 days before surgery, to make sure that the level is not too high or low. o Check your blood sugar the morning of your surgery when you wake up and every 2 hours until you get to the Short Stay unit. . If your blood sugar is less than 70 mg/dL, you will need to treat for low blood sugar: o Do not take insulin. o Treat a low blood sugar (less than 70 mg/dL) with  cup of clear juice (cranberry or apple), 4 glucose tablets, OR glucose gel. o Recheck blood sugar in 15 minutes after treatment (to make sure it is greater than 70 mg/dL). If your blood sugar is not greater than 70 mg/dL on recheck, call [phone] for further instructions. . Report your blood sugar to the short stay nurse when you get to Short Stay.  . If you are admitted to the hospital after surgery: o Your blood sugar will be checked by the  staff and you will probably be given insulin after surgery (instead of oral diabetes medicines) to make sure you have good blood sugar levels. o The goal for blood sugar control after surgery is 80-180 mg/dL  WHAT DO I DO ABOUT MY DIABETES MEDICATION?   [name] Kitchen Do not take oral diabetes medicines (pills) the morning of surgery such as glipiZIDE (GLUCOTROL), metFORMIN  (GLUCOPHAGE) and sitaGLIPtin (JANUVIA)   Patient Signature:  Date:   Nurse Signature:  Date:   Reviewed and Endorsed by [facility] Patient Education Committee, August 2015  Do not wear jewelry, make-up or nail polish.  Do not wear lotions, powders, or perfumes, or deoderant.  Do not shave 48 hours prior to surgery.  Men may shave face and neck.  Do not bring valuables to the hospital.  [facility] is not responsible for any belongings or valuables.  Contacts, dentures or bridgework may not be worn into surgery.  Leave your suitcase in the car.  After surgery it may be brought to your room.  For patients admitted to the hospital, discharge time will be determined by your treatment team.  Patients discharged the day of surgery will not be allowed to drive home.  Special instructions:Shower the night before surgery and the morning of surgery with CHG. Please read over the following fact sheets that you were given. Pain Booklet, Coughing and Deep Breathing, MRSA Information and Surgical Site Infection Prevention

## 2016-10-28 ENCOUNTER — Encounter (HOSPITAL_COMMUNITY): Payer: Self-pay

## 2016-10-28 ENCOUNTER — Encounter (HOSPITAL_COMMUNITY)
Admission: RE | Admit: 2016-10-28 | Discharge: 2016-10-28 | Disposition: A | Discharge status: Home / Self Care | Payer: Medicare Other | Source: Ambulatory Visit | Attending: Neurosurgery | Admitting: Neurosurgery

## 2016-10-28 DIAGNOSIS — Z01812 Encounter for preprocedural laboratory examination: Secondary | ICD-10-CM | POA: Diagnosis not present

## 2016-10-28 DIAGNOSIS — M542 Cervicalgia: Secondary | ICD-10-CM | POA: Diagnosis not present

## 2016-10-28 HISTORY — DX: Presence of spectacles and contact lenses: Z97.3

## 2016-10-28 HISTORY — DX: Other cervical disc displacement, unspecified cervical region: M50.20

## 2016-10-28 LAB — BASIC METABOLIC PANEL
ANION GAP: 9 (ref 5–15)
BUN: 9 mg/dL (ref 6–20)
CHLORIDE: 103 mmol/L (ref 101–111)
CO2: 27 mmol/L (ref 22–32)
Calcium: 9.1 mg/dL (ref 8.9–10.3)
Creatinine, Ser: 0.89 mg/dL (ref 0.61–1.24)
GFR calc non Af Amer: >60 mL/min (ref >60)
Glucose, Bld: 95 mg/dL (ref 65–99)
POTASSIUM: 3.5 mmol/L (ref 3.5–5.1)
SODIUM: 139 mmol/L (ref 135–145)

## 2016-10-28 LAB — CBC WITH DIFFERENTIAL/PLATELET
Basophils Absolute: 0 10*3/uL (ref 0.0–0.1)
Basophils Relative: 0 %
EOS ABS: 0.2 10*3/uL (ref 0.0–0.7)
Eosinophils Relative: 4 %
HCT: 37.1 % — ABNORMAL LOW (ref 39.0–52.0)
Hemoglobin: 12.5 g/dL — ABNORMAL LOW (ref 13.0–17.0)
LYMPHS ABS: 1.9 10*3/uL (ref 0.7–4.0)
LYMPHS PCT: 38 %
MCH: 28.9 pg (ref 26.0–34.0)
MCHC: 33.7 g/dL (ref 30.0–36.0)
MCV: 85.7 fL (ref 78.0–100.0)
Monocytes Absolute: 0.4 10*3/uL (ref 0.1–1.0)
Monocytes Relative: 8 %
NEUTROS PCT: 50 %
Neutro Abs: 2.5 10*3/uL (ref 1.7–7.7)
Platelets: 180 10*3/uL (ref 150–400)
RBC: 4.33 MIL/uL (ref 4.22–5.81)
RDW: 13.5 % (ref 11.5–15.5)
WBC: 4.9 10*3/uL (ref 4.0–10.5)

## 2016-10-28 LAB — SURGICAL PCR SCREEN
MRSA, PCR: NEGATIVE
Staphylococcus aureus: NEGATIVE

## 2016-10-28 LAB — GLUCOSE, CAPILLARY: GLUCOSE-CAPILLARY: 94 mg/dL (ref 65–99)

## 2016-10-28 LAB — HEMOGLOBIN A1C
HEMOGLOBIN A1C: 5.4 % (ref 4.8–5.6)
MEAN PLASMA GLUCOSE: 108.28 mg/dL

## 2016-10-28 NOTE — Progress Notes (Signed)
Pt denies SOB and chest pain. Pt under the care of Dr. Saunders Revel , Cardiology. Pt chart forwarded to anesthesia to review pt history and clearance note on chart.

## 2016-10-28 NOTE — Progress Notes (Signed)
   10/28/16 0836  OBSTRUCTIVE SLEEP APNEA  Have you ever been diagnosed with sleep apnea through a sleep study? No  Do you snore loudly (loud enough to be heard through closed doors)?  0  Do you often feel tired, fatigued, or sleepy during the daytime (such as falling asleep during driving or talking to someone)? 0  Has anyone observed you stop breathing during your sleep? 0  Do you have, or are you being treated for high blood pressure? 1  BMI more than 35 kg/m2? 1  Age > 50 (1-yes) 1  Neck circumference greater than:Male 16 inches or larger, Male 17inches or larger? 1  Male Gender (Yes=1) 1  Obstructive Sleep Apnea Score 5

## 2016-10-29 ENCOUNTER — Encounter (HOSPITAL_COMMUNITY): Payer: Self-pay

## 2016-10-29 NOTE — Progress Notes (Signed)
Anesthesia Chart Review:  Pt is a 56 year old male scheduled for C3-4 ACDF on 11/05/2016 with Earnie Larsson, MD  - PCP is Nolene Ebbs, MD - Cardiologist is Nelva Bush, MD. Pt was cleared for surgery at last office visit 10/26/16 with Richardson Dopp, PA  PMH includes:  CAD (s/p CABG 2000), CHF, HTN, DM, hyperlipidemia, anemia, seizure disorder, RA, DVT, GERD. Hx cocaine use. Never smoker. BMI 37. S/p cataract extraction 07/13/16 and 06/15/16.  S/p ORIF mandibular fx 12/19/15.   Medications include: Albuterol, ASA 81 mg, Lipitor, Qvar, dexlansoprazole, Lasix, glipizide, HCTZ, Atrovent, Imdur, lisinopril, metformin, metoprolol, potassium, Zantac, sitagliptin  BP 123/75   Pulse (!) 59   Temp 36.5 C   Resp 20   Ht 5\' 10"  (1.778 m)   Wt 256 lb 6.4 oz (116.3 kg)   SpO2 99%   BMI 36.79 kg/m   Preoperative labs reviewed.  HgA1c 5.4, glucose 95  CXR 01/28/16: No active cardiopulmonary disease.  EKG 10/26/16: Sinus rhythm with PACs  Nuclear stress test 12/25/15:  1. No reversible ischemia or infarction.  2. Normal left ventricular wall motion.  3. Left ventricular ejection fraction is 54%.  4. Non invasive risk stratification: Low  Echo 11/15/15:  - Left ventricle: The cavity size was normal. Systolic function was normal. The estimated ejection fraction was in the range of 55% to 60%. Wall motion was normal; there were no regional wall motion abnormalities. Features are consistent with a pseudonormal left ventricular filling pattern, with concomitant abnormal relaxation and increased filling pressure (grade 2 diastolic dysfunction). - Aortic valve: Trileaflet; normal thickness, mildly calcified leaflets. There was trivial regurgitation. - Pulmonic valve: There was mild regurgitation.  Cardiac cath 08/31/11:  - RCA: large caliber, dominant vessel. Tortuous. Smooth and normal.  - LM: large caliber vessel, which is smooth and normal.  - CX: moderate caliber vessel, giving origin to a small  OM1. Smooth and normal.  - Ramus intermediate: NA.  - LAD: large caliber vessel, giving origin to several small  diagonals. It is smooth and normal.  - Left ventriculography revealed ejection fraction of 55-60%. There was no wall motion abnormality.  - Left subclavian arteriogram: The left subdural artery stent is widely patent. LIMA is atretic. - IMPRESSIONS:  1. Normal coronary arteries, right dominant circulation. Patient states that he's had coronary stent in the past. I suspect this is probably a subclavian stent. LIMA to LAD is atretic. There is no significant coronary artery disease. LVEF: Normal 2. Patent left subclavian artery stent.  If no changes, I anticipate pt can proceed with surgery as scheduled.   Willeen Cass, FNP-BC [facility]/Anesthesiology Phone: [phone] 10/29/2016 1:52 PM

## 2016-11-04 ENCOUNTER — Encounter (HOSPITAL_COMMUNITY): Payer: Self-pay | Admitting: Anesthesiology

## 2016-11-04 NOTE — Anesthesia Preprocedure Evaluation (Addendum)
Anesthesia Evaluation  Patient identified by MRN, date of birth, ID band Patient awake  General Assessment Comment:Poor historian , no pcp, homeless  Reviewed: Allergy & Precautions, NPO status , Patient's Chart, lab work & pertinent test results  Airway Mallampati: II  TM Distance: >3 FB Neck ROM: Full    Dental  (+) Edentulous Upper, Edentulous Lower   Pulmonary shortness of breath, with exertion and lying, COPD,  COPD inhaler,  Chronic bronchitis   Pulmonary exam normal breath sounds clear to auscultation       Cardiovascular Exercise Tolerance: Poor hypertension, Pt. on medications and Pt. on home beta blockers + CAD, + Past MI, + Peripheral Vascular Disease and +CHF  Normal cardiovascular exam Rhythm:Regular Rate:Normal     Neuro/Psych  Headaches, Seizures -, Well Controlled,  PSYCHIATRIC DISORDERS Anxiety Depression Bipolar Disorder Keppra  Neuromuscular disease    GI/Hepatic PUD, GERD  Medicated and Controlled,(+)     substance abuse  alcohol use, cocaine use and marijuana use,   Endo/Other  diabetes, Well Controlled, Type 2, Oral Hypoglycemic AgentsMorbid obesityHyperlipidemia  Renal/GU   negative genitourinary   Musculoskeletal  (+) Arthritis , Osteoarthritis,  HNP C3-4 Chronic Back pain   Abdominal (+) + obese,   Peds  Hematology  (+) anemia ,   Anesthesia Other Findings   Reproductive/Obstetrics                            Anesthesia Physical  Anesthesia Plan  ASA: III  Anesthesia Plan: General   Post-op Pain Management:    Induction: Intravenous  PONV Risk Score and Plan: 3 and Ondansetron, Dexamethasone, Midazolam, Propofol infusion and Promethazine  Airway Management Planned: Oral ETT  Additional Equipment:   Intra-op Plan:   Post-operative Plan: Extubation in OR  Informed Consent: I have reviewed the patients History and Physical, chart, labs and discussed  the procedure including the risks, benefits and alternatives for the proposed anesthesia with the patient or authorized representative who has indicated his/her understanding and acceptance.   Dental advisory given  Plan Discussed with: CRNA, Anesthesiologist and Surgeon  Anesthesia Plan Comments:        Anesthesia Quick Evaluation

## 2016-11-05 ENCOUNTER — Inpatient Hospital Stay (HOSPITAL_COMMUNITY): Payer: Medicare Other | Admitting: Emergency Medicine

## 2016-11-05 ENCOUNTER — Encounter (HOSPITAL_COMMUNITY)
Admission: RE | Disposition: A | Discharge status: Home / Self Care | Payer: Self-pay | Source: Home / Self Care | Attending: Neurosurgery

## 2016-11-05 ENCOUNTER — Inpatient Hospital Stay (HOSPITAL_COMMUNITY)
Admission: RE | Admit: 2016-11-05 | Discharge: 2016-11-06 | DRG: 472 | Disposition: A | Discharge status: Home / Self Care | Payer: Medicare Other | Attending: Neurological Surgery | Admitting: Neurological Surgery

## 2016-11-05 ENCOUNTER — Encounter (HOSPITAL_COMMUNITY): Payer: Self-pay | Admitting: *Deleted

## 2016-11-05 ENCOUNTER — Inpatient Hospital Stay (HOSPITAL_COMMUNITY): Payer: Medicare Other | Admitting: Anesthesiology

## 2016-11-05 ENCOUNTER — Inpatient Hospital Stay (HOSPITAL_COMMUNITY): Payer: Medicare Other

## 2016-11-05 DIAGNOSIS — Z833 Family history of diabetes mellitus: Secondary | ICD-10-CM | POA: Diagnosis not present

## 2016-11-05 DIAGNOSIS — M545 Low back pain: Secondary | ICD-10-CM | POA: Diagnosis present

## 2016-11-05 DIAGNOSIS — E1140 Type 2 diabetes mellitus with diabetic neuropathy, unspecified: Secondary | ICD-10-CM | POA: Diagnosis present

## 2016-11-05 DIAGNOSIS — G40409 Other generalized epilepsy and epileptic syndromes, not intractable, without status epilepticus: Secondary | ICD-10-CM | POA: Diagnosis present

## 2016-11-05 DIAGNOSIS — I2510 Atherosclerotic heart disease of native coronary artery without angina pectoris: Secondary | ICD-10-CM | POA: Diagnosis present

## 2016-11-05 DIAGNOSIS — R269 Unspecified abnormalities of gait and mobility: Secondary | ICD-10-CM | POA: Diagnosis present

## 2016-11-05 DIAGNOSIS — Z886 Allergy status to analgesic agent status: Secondary | ICD-10-CM

## 2016-11-05 DIAGNOSIS — M069 Rheumatoid arthritis, unspecified: Secondary | ICD-10-CM | POA: Diagnosis present

## 2016-11-05 DIAGNOSIS — F419 Anxiety disorder, unspecified: Secondary | ICD-10-CM | POA: Diagnosis present

## 2016-11-05 DIAGNOSIS — M2578 Osteophyte, vertebrae: Secondary | ICD-10-CM | POA: Diagnosis present

## 2016-11-05 DIAGNOSIS — F1414 Cocaine abuse with cocaine-induced mood disorder: Secondary | ICD-10-CM

## 2016-11-05 DIAGNOSIS — I10 Essential (primary) hypertension: Secondary | ICD-10-CM | POA: Diagnosis present

## 2016-11-05 DIAGNOSIS — Z419 Encounter for procedure for purposes other than remedying health state, unspecified: Secondary | ICD-10-CM

## 2016-11-05 DIAGNOSIS — Z86718 Personal history of other venous thrombosis and embolism: Secondary | ICD-10-CM | POA: Diagnosis not present

## 2016-11-05 DIAGNOSIS — Z7984 Long term (current) use of oral hypoglycemic drugs: Secondary | ICD-10-CM

## 2016-11-05 DIAGNOSIS — E7800 Pure hypercholesterolemia, unspecified: Secondary | ICD-10-CM | POA: Diagnosis present

## 2016-11-05 DIAGNOSIS — I252 Old myocardial infarction: Secondary | ICD-10-CM | POA: Diagnosis not present

## 2016-11-05 DIAGNOSIS — Z888 Allergy status to other drugs, medicaments and biological substances status: Secondary | ICD-10-CM | POA: Diagnosis not present

## 2016-11-05 DIAGNOSIS — E1159 Type 2 diabetes mellitus with other circulatory complications: Secondary | ICD-10-CM

## 2016-11-05 DIAGNOSIS — M5001 Cervical disc disorder with myelopathy,  high cervical region: Secondary | ICD-10-CM | POA: Diagnosis present

## 2016-11-05 DIAGNOSIS — Z8711 Personal history of peptic ulcer disease: Secondary | ICD-10-CM | POA: Diagnosis not present

## 2016-11-05 DIAGNOSIS — Z8249 Family history of ischemic heart disease and other diseases of the circulatory system: Secondary | ICD-10-CM | POA: Diagnosis not present

## 2016-11-05 DIAGNOSIS — F319 Bipolar disorder, unspecified: Secondary | ICD-10-CM | POA: Diagnosis present

## 2016-11-05 DIAGNOSIS — S02609A Fracture of mandible, unspecified, initial encounter for closed fracture: Secondary | ICD-10-CM

## 2016-11-05 DIAGNOSIS — F314 Bipolar disorder, current episode depressed, severe, without psychotic features: Secondary | ICD-10-CM

## 2016-11-05 DIAGNOSIS — M4802 Spinal stenosis, cervical region: Secondary | ICD-10-CM | POA: Diagnosis present

## 2016-11-05 DIAGNOSIS — Z7982 Long term (current) use of aspirin: Secondary | ICD-10-CM

## 2016-11-05 DIAGNOSIS — K219 Gastro-esophageal reflux disease without esophagitis: Secondary | ICD-10-CM | POA: Diagnosis present

## 2016-11-05 DIAGNOSIS — G8929 Other chronic pain: Secondary | ICD-10-CM | POA: Diagnosis present

## 2016-11-05 DIAGNOSIS — Z79899 Other long term (current) drug therapy: Secondary | ICD-10-CM

## 2016-11-05 DIAGNOSIS — M4712 Other spondylosis with myelopathy, cervical region: Secondary | ICD-10-CM | POA: Diagnosis present

## 2016-11-05 DIAGNOSIS — M542 Cervicalgia: Secondary | ICD-10-CM | POA: Diagnosis present

## 2016-11-05 DIAGNOSIS — Z7951 Long term (current) use of inhaled steroids: Secondary | ICD-10-CM

## 2016-11-05 HISTORY — PX: ANTERIOR CERVICAL DECOMP/DISCECTOMY FUSION: SHX1161

## 2016-11-05 LAB — GLUCOSE, CAPILLARY
GLUCOSE-CAPILLARY: 107 mg/dL — AB (ref 65–99)
GLUCOSE-CAPILLARY: 166 mg/dL — AB (ref 65–99)
Glucose-Capillary: 91 mg/dL (ref 65–99)
Glucose-Capillary: 112 mg/dL — ABNORMAL HIGH (ref 65–99)
Glucose-Capillary: 198 mg/dL — ABNORMAL HIGH (ref 65–99)

## 2016-11-05 SURGERY — ANTERIOR CERVICAL DECOMPRESSION/DISCECTOMY FUSION 1 LEVEL
Anesthesia: General

## 2016-11-05 MED ORDER — MECLIZINE HCL 25 MG PO TABS
25.0000 mg | ORAL_TABLET | Freq: Three times a day (TID) | ORAL | Status: DC | PRN
  Filled 2016-11-05: qty 1

## 2016-11-05 MED ORDER — OXYCODONE-ACETAMINOPHEN 5-325 MG PO TABS
1.0000 | ORAL_TABLET | ORAL | Status: DC | PRN
  Administered 2016-11-05 – 2016-11-06 (×2): 1 via ORAL
  Filled 2016-11-05 (×4): qty 1

## 2016-11-05 MED ORDER — GLIPIZIDE 10 MG PO TABS
10.0000 mg | ORAL_TABLET | Freq: Every day | ORAL | Status: DC
  Filled 2016-11-05: qty 1

## 2016-11-05 MED ORDER — BUDESONIDE 0.25 MG/2ML IN SUSP
0.2500 mg | Freq: Two times a day (BID) | RESPIRATORY_TRACT | Status: DC
  Filled 2016-11-05 (×2): qty 2

## 2016-11-05 MED ORDER — HYDROCODONE-ACETAMINOPHEN 7.5-325 MG PO TABS: 1.0000 | ORAL_TABLET | Freq: Once | ORAL | Status: DC | PRN

## 2016-11-05 MED ORDER — HYDROMORPHONE HCL 1 MG/ML IJ SOLN: 0.5000 mg | INTRAMUSCULAR | Status: DC | PRN

## 2016-11-05 MED ORDER — THROMBIN 5000 UNITS EX SOLR
CUTANEOUS | Status: DC | PRN
  Administered 2016-11-05: 10000 [IU] via TOPICAL

## 2016-11-05 MED ORDER — HYDROMORPHONE HCL 1 MG/ML IJ SOLN
INTRAMUSCULAR | Status: AC
  Filled 2016-11-05: qty 1

## 2016-11-05 MED ORDER — ONDANSETRON HCL 4 MG/2ML IJ SOLN
INTRAMUSCULAR | Status: DC | PRN
Start: 2016-11-05 — End: 2016-11-05
  Administered 2016-11-05: 4 mg via INTRAVENOUS

## 2016-11-05 MED ORDER — CEFAZOLIN SODIUM-DEXTROSE 2-4 GM/100ML-% IV SOLN
2.0000 g | INTRAVENOUS | Status: AC
  Administered 2016-11-05: 2 g via INTRAVENOUS

## 2016-11-05 MED ORDER — ATORVASTATIN CALCIUM 40 MG PO TABS
40.0000 mg | ORAL_TABLET | Freq: Every day | ORAL | Status: DC
  Administered 2016-11-05: 40 mg via ORAL
  Filled 2016-11-05: qty 1

## 2016-11-05 MED ORDER — FAMOTIDINE 20 MG PO TABS
20.0000 mg | ORAL_TABLET | Freq: Two times a day (BID) | ORAL | Status: DC
  Administered 2016-11-05 (×2): 20 mg via ORAL
  Filled 2016-11-05 (×3): qty 1

## 2016-11-05 MED ORDER — MIDAZOLAM HCL 2 MG/2ML IJ SOLN
INTRAMUSCULAR | Status: DC | PRN
  Administered 2016-11-05: 2 mg via INTRAVENOUS

## 2016-11-05 MED ORDER — IPRATROPIUM BROMIDE 0.02 % IN SOLN
0.5000 mg | Freq: Two times a day (BID) | RESPIRATORY_TRACT | Status: DC | PRN
  Filled 2016-11-05: qty 2.5

## 2016-11-05 MED ORDER — POTASSIUM CHLORIDE CRYS ER 10 MEQ PO TBCR
10.0000 meq | EXTENDED_RELEASE_TABLET | Freq: Every day | ORAL | Status: DC
  Administered 2016-11-05: 10 meq via ORAL
  Filled 2016-11-05 (×2): qty 1

## 2016-11-05 MED ORDER — FENTANYL CITRATE (PF) 250 MCG/5ML IJ SOLN
INTRAMUSCULAR | Status: DC | PRN
Start: 2016-11-05 — End: 2016-11-05
  Administered 2016-11-05 (×2): 50 ug via INTRAVENOUS
  Administered 2016-11-05: 150 ug via INTRAVENOUS
  Administered 2016-11-05: 100 ug via INTRAVENOUS

## 2016-11-05 MED ORDER — METFORMIN HCL 500 MG PO TABS
500.0000 mg | ORAL_TABLET | Freq: Two times a day (BID) | ORAL | Status: DC
  Administered 2016-11-05: 500 mg via ORAL
  Filled 2016-11-05 (×2): qty 1

## 2016-11-05 MED ORDER — ALPRAZOLAM 0.5 MG PO TABS
2.0000 mg | ORAL_TABLET | Freq: Two times a day (BID) | ORAL | Status: DC
  Administered 2016-11-05: 2 mg via ORAL
  Filled 2016-11-05 (×3): qty 4

## 2016-11-05 MED ORDER — PROPOFOL 10 MG/ML IV BOLUS
INTRAVENOUS | Status: DC | PRN
  Administered 2016-11-05: 160 mg via INTRAVENOUS

## 2016-11-05 MED ORDER — MENTHOL 3 MG MT LOZG: 1.0000 | LOZENGE | OROMUCOSAL | Status: DC | PRN

## 2016-11-05 MED ORDER — SERTRALINE HCL 100 MG PO TABS
100.0000 mg | ORAL_TABLET | Freq: Every day | ORAL | Status: DC
  Filled 2016-11-05: qty 2
  Filled 2016-11-05: qty 1

## 2016-11-05 MED ORDER — PANTOPRAZOLE SODIUM 40 MG PO TBEC
40.0000 mg | DELAYED_RELEASE_TABLET | Freq: Every day | ORAL | Status: DC
  Filled 2016-11-05: qty 1

## 2016-11-05 MED ORDER — OXYCODONE HCL 5 MG PO TABS
5.0000 mg | ORAL_TABLET | ORAL | Status: DC | PRN
  Administered 2016-11-05 – 2016-11-06 (×2): 5 mg via ORAL
  Filled 2016-11-05 (×4): qty 1

## 2016-11-05 MED ORDER — SODIUM CHLORIDE 0.9% FLUSH: 3.0000 mL | INTRAVENOUS | Status: DC | PRN

## 2016-11-05 MED ORDER — HYDROMORPHONE HCL 1 MG/ML IJ SOLN
0.2500 mg | INTRAMUSCULAR | Status: DC | PRN
  Administered 2016-11-05 (×4): 0.5 mg via INTRAVENOUS

## 2016-11-05 MED ORDER — TAMSULOSIN HCL 0.4 MG PO CAPS
0.4000 mg | ORAL_CAPSULE | Freq: Every day | ORAL | Status: DC
  Filled 2016-11-05: qty 1

## 2016-11-05 MED ORDER — FENTANYL CITRATE (PF) 250 MCG/5ML IJ SOLN
INTRAMUSCULAR | Status: AC
  Filled 2016-11-05: qty 5

## 2016-11-05 MED ORDER — HYDROMORPHONE HCL 1 MG/ML IJ SOLN
INTRAMUSCULAR | Status: AC
  Administered 2016-11-05: 0.5 mg via INTRAVENOUS
  Filled 2016-11-05: qty 1

## 2016-11-05 MED ORDER — THROMBIN 5000 UNITS EX SOLR
CUTANEOUS | Status: AC
  Filled 2016-11-05: qty 10000

## 2016-11-05 MED ORDER — ALBUTEROL SULFATE (2.5 MG/3ML) 0.083% IN NEBU: 3.0000 mL | INHALATION_SOLUTION | Freq: Four times a day (QID) | RESPIRATORY_TRACT | Status: DC | PRN

## 2016-11-05 MED ORDER — LISINOPRIL 10 MG PO TABS
10.0000 mg | ORAL_TABLET | Freq: Every day | ORAL | Status: DC
  Filled 2016-11-05 (×3): qty 1

## 2016-11-05 MED ORDER — OXYCODONE-ACETAMINOPHEN 10-325 MG PO TABS: 1.0000 | ORAL_TABLET | ORAL | Status: DC | PRN

## 2016-11-05 MED ORDER — VITAMIN D (ERGOCALCIFEROL) 1.25 MG (50000 UNIT) PO CAPS
50000.0000 [IU] | ORAL_CAPSULE | ORAL | Status: DC
  Filled 2016-11-05: qty 1

## 2016-11-05 MED ORDER — 0.9 % SODIUM CHLORIDE (POUR BTL) OPTIME
TOPICAL | Status: DC | PRN
  Administered 2016-11-05: 1000 mL

## 2016-11-05 MED ORDER — SUGAMMADEX SODIUM 200 MG/2ML IV SOLN
INTRAVENOUS | Status: DC | PRN
  Administered 2016-11-05: 200 mg via INTRAVENOUS

## 2016-11-05 MED ORDER — DEXAMETHASONE SODIUM PHOSPHATE 10 MG/ML IJ SOLN
10.0000 mg | INTRAMUSCULAR | Status: AC
  Administered 2016-11-05: 10 mg via INTRAVENOUS
  Filled 2016-11-05: qty 1

## 2016-11-05 MED ORDER — PHENYLEPHRINE HCL 10 MG/ML IJ SOLN
INTRAMUSCULAR | Status: DC | PRN
  Administered 2016-11-05: 20 ug/min via INTRAVENOUS

## 2016-11-05 MED ORDER — OXYCODONE-ACETAMINOPHEN 5-325 MG PO TABS
ORAL_TABLET | ORAL | Status: AC
  Administered 2016-11-05: 2
  Filled 2016-11-05: qty 2

## 2016-11-05 MED ORDER — FLUTICASONE PROPIONATE 50 MCG/ACT NA SUSP
2.0000 | Freq: Every day | NASAL | Status: DC | PRN
  Filled 2016-11-05: qty 16

## 2016-11-05 MED ORDER — MIDAZOLAM HCL 2 MG/2ML IJ SOLN
INTRAMUSCULAR | Status: AC
  Filled 2016-11-05: qty 2

## 2016-11-05 MED ORDER — ISOSORBIDE MONONITRATE ER 30 MG PO TB24
30.0000 mg | ORAL_TABLET | Freq: Every day | ORAL | Status: DC
  Filled 2016-11-05: qty 1

## 2016-11-05 MED ORDER — HYDROCHLOROTHIAZIDE 25 MG PO TABS
25.0000 mg | ORAL_TABLET | Freq: Every day | ORAL | Status: DC
  Administered 2016-11-05: 25 mg via ORAL
  Filled 2016-11-05 (×2): qty 1

## 2016-11-05 MED ORDER — FUROSEMIDE 40 MG PO TABS: 40.0000 mg | ORAL_TABLET | Freq: Every day | ORAL | Status: DC | PRN

## 2016-11-05 MED ORDER — LINAGLIPTIN 5 MG PO TABS
5.0000 mg | ORAL_TABLET | Freq: Every day | ORAL | Status: DC
  Filled 2016-11-05: qty 1

## 2016-11-05 MED ORDER — FENTANYL CITRATE (PF) 100 MCG/2ML IJ SOLN
INTRAMUSCULAR | Status: AC
  Filled 2016-11-05: qty 2

## 2016-11-05 MED ORDER — METOPROLOL TARTRATE 12.5 MG HALF TABLET
12.5000 mg | ORAL_TABLET | Freq: Two times a day (BID) | ORAL | Status: DC
  Administered 2016-11-05: 12.5 mg via ORAL
  Filled 2016-11-05 (×2): qty 1

## 2016-11-05 MED ORDER — MEPERIDINE HCL 25 MG/ML IJ SOLN: 6.2500 mg | INTRAMUSCULAR | Status: DC | PRN

## 2016-11-05 MED ORDER — HYDROCODONE-ACETAMINOPHEN 5-325 MG PO TABS
1.0000 | ORAL_TABLET | ORAL | Status: DC | PRN
Start: 2016-11-05 — End: 2016-11-06
  Administered 2016-11-05 (×2): 2 via ORAL
  Filled 2016-11-05 (×2): qty 2

## 2016-11-05 MED ORDER — ONDANSETRON HCL 4 MG/2ML IJ SOLN: 4.0000 mg | Freq: Four times a day (QID) | INTRAMUSCULAR | Status: DC | PRN

## 2016-11-05 MED ORDER — CHLORHEXIDINE GLUCONATE CLOTH 2 % EX PADS: 6.0000 | MEDICATED_PAD | Freq: Once | CUTANEOUS | Status: DC

## 2016-11-05 MED ORDER — PROMETHAZINE HCL 25 MG/ML IJ SOLN: 6.2500 mg | INTRAMUSCULAR | Status: DC | PRN

## 2016-11-05 MED ORDER — ROCURONIUM BROMIDE 10 MG/ML (PF) SYRINGE
PREFILLED_SYRINGE | INTRAVENOUS | Status: DC | PRN
  Administered 2016-11-05: 30 mg via INTRAVENOUS
  Administered 2016-11-05: 50 mg via INTRAVENOUS

## 2016-11-05 MED ORDER — CEFAZOLIN SODIUM-DEXTROSE 1-4 GM/50ML-% IV SOLN
1.0000 g | Freq: Three times a day (TID) | INTRAVENOUS | Status: AC
  Administered 2016-11-05 (×2): 1 g via INTRAVENOUS
  Filled 2016-11-05 (×2): qty 50

## 2016-11-05 MED ORDER — CYCLOBENZAPRINE HCL 10 MG PO TABS
10.0000 mg | ORAL_TABLET | Freq: Three times a day (TID) | ORAL | Status: DC | PRN
  Administered 2016-11-05 (×3): 10 mg via ORAL
  Filled 2016-11-05 (×4): qty 1

## 2016-11-05 MED ORDER — LIDOCAINE 2% (20 MG/ML) 5 ML SYRINGE
INTRAMUSCULAR | Status: DC | PRN
  Administered 2016-11-05: 80 mg via INTRAVENOUS

## 2016-11-05 MED ORDER — SODIUM CHLORIDE 0.9 % IV SOLN: 250.0000 mL | INTRAVENOUS | Status: DC

## 2016-11-05 MED ORDER — PROPOFOL 10 MG/ML IV BOLUS
INTRAVENOUS | Status: AC
  Filled 2016-11-05: qty 20

## 2016-11-05 MED ORDER — PHENOL 1.4 % MT LIQD
1.0000 | OROMUCOSAL | Status: DC | PRN
  Administered 2016-11-05: 1 via OROMUCOSAL
  Filled 2016-11-05: qty 177

## 2016-11-05 MED ORDER — PREGABALIN 50 MG PO CAPS: 100.0000 mg | ORAL_CAPSULE | Freq: Two times a day (BID) | ORAL | Status: DC | PRN

## 2016-11-05 MED ORDER — INSULIN ASPART 100 UNIT/ML ~~LOC~~ SOLN
0.0000 [IU] | Freq: Three times a day (TID) | SUBCUTANEOUS | Status: DC
  Administered 2016-11-05: 4 [IU] via SUBCUTANEOUS

## 2016-11-05 MED ORDER — LACTATED RINGERS IV SOLN
INTRAVENOUS | Status: DC | PRN
  Administered 2016-11-05: 07:00:00 via INTRAVENOUS

## 2016-11-05 MED ORDER — CEFAZOLIN SODIUM-DEXTROSE 2-4 GM/100ML-% IV SOLN
INTRAVENOUS | Status: AC
  Filled 2016-11-05: qty 100

## 2016-11-05 MED ORDER — SODIUM CHLORIDE 0.9 % IR SOLN
Status: DC | PRN
  Administered 2016-11-05: 09:00:00

## 2016-11-05 MED ORDER — HEMOSTATIC AGENTS (NO CHARGE) OPTIME
TOPICAL | Status: DC | PRN
  Administered 2016-11-05: 1 via TOPICAL

## 2016-11-05 MED ORDER — CYCLOBENZAPRINE HCL 10 MG PO TABS
ORAL_TABLET | ORAL | Status: AC
  Administered 2016-11-05: 10 mg via ORAL
  Filled 2016-11-05: qty 1

## 2016-11-05 MED ORDER — SODIUM CHLORIDE 0.9% FLUSH: 3.0000 mL | Freq: Two times a day (BID) | INTRAVENOUS | Status: DC

## 2016-11-05 MED ORDER — LORATADINE 10 MG PO TABS
10.0000 mg | ORAL_TABLET | Freq: Every day | ORAL | Status: DC
  Filled 2016-11-05: qty 1

## 2016-11-05 MED ORDER — NITROGLYCERIN 0.4 MG SL SUBL: 0.4000 mg | SUBLINGUAL_TABLET | SUBLINGUAL | Status: DC | PRN

## 2016-11-05 MED ORDER — ONDANSETRON HCL 4 MG PO TABS: 4.0000 mg | ORAL_TABLET | Freq: Four times a day (QID) | ORAL | Status: DC | PRN

## 2016-11-05 SURGICAL SUPPLY — 57 items
APL SKNCLS STERI-STRIP NONHPOA (GAUZE/BANDAGES/DRESSINGS) ×1
BAG DECANTER FOR FLEXI CONT (MISCELLANEOUS) ×3 IMPLANT
BENZOIN TINCTURE PRP APPL 2/3 (GAUZE/BANDAGES/DRESSINGS) ×3 IMPLANT
BIT DRILL 13 (BIT) ×1 IMPLANT
BIT DRILL 13MM (BIT) ×1
BUR MATCHSTICK NEURO 3.0 LAGG (BURR) ×3 IMPLANT
CAGE PEEK 6X14X11 (Cage) ×3 IMPLANT
CANISTER SUCT 3000ML PPV (MISCELLANEOUS) ×3 IMPLANT
CARTRIDGE OIL MAESTRO DRILL (MISCELLANEOUS) ×1 IMPLANT
CLOSURE WOUND 1/2 X4 (GAUZE/BANDAGES/DRESSINGS) ×1
DIFFUSER DRILL AIR PNEUMATIC (MISCELLANEOUS) ×3 IMPLANT
DRAPE C-ARM 42X72 X-RAY (DRAPES) ×6 IMPLANT
DRAPE LAPAROTOMY 100X72 PEDS (DRAPES) ×3 IMPLANT
DRAPE MICROSCOPE LEICA (MISCELLANEOUS) ×3 IMPLANT
DRAPE POUCH INSTRU U-SHP 10X18 (DRAPES) ×3 IMPLANT
DURAPREP 6ML APPLICATOR 50/CS (WOUND CARE) ×3 IMPLANT
ELECT COATED BLADE 2.86 ST (ELECTRODE) ×3 IMPLANT
ELECT REM PT RETURN 9FT ADLT (ELECTROSURGICAL) ×3
ELECTRODE REM PT RTRN 9FT ADLT (ELECTROSURGICAL) ×1 IMPLANT
GAUZE SPONGE 4X4 12PLY STRL (GAUZE/BANDAGES/DRESSINGS) ×3 IMPLANT
GAUZE SPONGE 4X4 12PLY STRL LF (GAUZE/BANDAGES/DRESSINGS) ×2 IMPLANT
GAUZE SPONGE 4X4 16PLY XRAY LF (GAUZE/BANDAGES/DRESSINGS) IMPLANT
GLOVE ECLIPSE 9.0 STRL (GLOVE) ×3 IMPLANT
GLOVE EXAM NITRILE LRG STRL (GLOVE) IMPLANT
GLOVE EXAM NITRILE XL STR (GLOVE) IMPLANT
GLOVE EXAM NITRILE XS STR PU (GLOVE) IMPLANT
GOWN STRL REUS W/ TWL LRG LVL3 (GOWN DISPOSABLE) IMPLANT
GOWN STRL REUS W/ TWL XL LVL3 (GOWN DISPOSABLE) IMPLANT
GOWN STRL REUS W/TWL 2XL LVL3 (GOWN DISPOSABLE) IMPLANT
GOWN STRL REUS W/TWL LRG LVL3 (GOWN DISPOSABLE)
GOWN STRL REUS W/TWL XL LVL3 (GOWN DISPOSABLE)
HALTER HD/CHIN CERV TRACTION D (MISCELLANEOUS) ×3 IMPLANT
KIT BASIN OR (CUSTOM PROCEDURE TRAY) ×3 IMPLANT
KIT ROOM TURNOVER OR (KITS) ×3 IMPLANT
NDL SPNL 20GX3.5 QUINCKE YW (NEEDLE) ×1 IMPLANT
NEEDLE SPNL 20GX3.5 QUINCKE YW (NEEDLE) ×3 IMPLANT
NS IRRIG 1000ML POUR BTL (IV SOLUTION) ×3 IMPLANT
OIL CARTRIDGE MAESTRO DRILL (MISCELLANEOUS) ×3
PACK LAMINECTOMY NEURO (CUSTOM PROCEDURE TRAY) ×3 IMPLANT
PAD ARMBOARD 7.5X6 YLW CONV (MISCELLANEOUS) ×9 IMPLANT
PLATE ELITE VISION 25MM (Plate) ×2 IMPLANT
RUBBERBAND STERILE (MISCELLANEOUS) ×6 IMPLANT
SCREW ST 13X4XST VA NS SPNE (Screw) IMPLANT
SCREW ST VAR 4 ATL (Screw) ×12 IMPLANT
SPACER SPNL 11X14X6XPEEK CVD (Cage) IMPLANT
SPCR SPNL 11X14X6XPEEK CVD (Cage) ×1 IMPLANT
SPONGE INTESTINAL PEANUT (DISPOSABLE) ×3 IMPLANT
SPONGE SURGIFOAM ABS GEL SZ50 (HEMOSTASIS) ×3 IMPLANT
STRIP CLOSURE SKIN 1/2X4 (GAUZE/BANDAGES/DRESSINGS) ×2 IMPLANT
SUT VIC AB 3-0 SH 8-18 (SUTURE) ×3 IMPLANT
SUT VIC AB 4-0 RB1 18 (SUTURE) ×3 IMPLANT
TAPE CLOTH 4X10 WHT NS (GAUZE/BANDAGES/DRESSINGS) ×3 IMPLANT
TAPE CLOTH SURG 4X10 WHT LF (GAUZE/BANDAGES/DRESSINGS) ×2 IMPLANT
TOWEL GREEN STERILE (TOWEL DISPOSABLE) ×3 IMPLANT
TOWEL GREEN STERILE FF (TOWEL DISPOSABLE) ×3 IMPLANT
TRAP SPECIMEN MUCOUS 40CC (MISCELLANEOUS) ×3 IMPLANT
WATER STERILE IRR 1000ML POUR (IV SOLUTION) ×3 IMPLANT

## 2016-11-05 NOTE — Anesthesia Procedure Notes (Signed)
Procedure Name: Intubation Date/Time: 11/05/2016 8:12 AM Performed by: Mervyn Gay Pre-anesthesia Checklist: Patient identified, Patient being monitored, Timeout performed, Emergency Drugs available and Suction available Patient Re-evaluated:Patient Re-evaluated prior to induction Oxygen Delivery Method: Circle System Utilized Preoxygenation: Pre-oxygenation with 100% oxygen Induction Type: IV induction Ventilation: Mask ventilation without difficulty and Oral airway inserted - appropriate to patient size Laryngoscope Size: Miller and 3 Grade View: Grade I Tube type: Oral Tube size: 7.5 mm Number of attempts: 1 Airway Equipment and Method: Stylet Placement Confirmation: ETT inserted through vocal cords under direct vision,  positive ETCO2 and breath sounds checked- equal and bilateral Secured at: 22 cm Tube secured with: Tape Dental Injury: Teeth and Oropharynx as per pre-operative assessment  Comments: Minimal movement of head/neck during intubation.

## 2016-11-05 NOTE — Transfer of Care (Signed)
Immediate Anesthesia Transfer of Care Note  Patient: Dalton [name]  Procedure(s) Performed: Procedure(s): Anterior Cervical Discectomy and Fusion - Cervical three-Cervical four (N/A)  Patient Location: PACU  Anesthesia Type:General  Level of Consciousness: awake, alert , oriented and pateint uncooperative  Airway & Oxygen Therapy: Patient Spontanous Breathing and Patient connected to nasal cannula oxygen  Post-op Assessment: Report given to RN, Post -op Vital signs reviewed and stable and Patient moving all extremities X 4  Post vital signs: Reviewed and stable  Last Vitals:  Vitals:   11/05/16 0610  BP: 139/86  Pulse: 64  Resp: 20  Temp: 36.8 C  SpO2: 100%    Last Pain:  Vitals:   11/05/16 0610  TempSrc: Oral  PainSc:       Patients Stated Pain Goal: 2 (81/82/99 3716)  Complications: No apparent anesthesia complications

## 2016-11-05 NOTE — Brief Op Note (Signed)
11/05/2016  9:17 AM  PATIENT:  Dalton [name]  56 y.o. male  PRE-OPERATIVE DIAGNOSIS:  herniated nucleus pulposus  POST-OPERATIVE DIAGNOSIS:  herniated nucleus pulposus  PROCEDURE:  Procedure(s): Anterior Cervical Discectomy and Fusion - Cervical three-Cervical four (N/A)  SURGEON:  Surgeon(s) and Role:    * Earnie Larsson, MD - Primary  PHYSICIAN ASSISTANT:   ASSISTANTS:    ANESTHESIA:   general  EBL:  Total I/O In: -  Out: 30 [Blood:30]  BLOOD ADMINISTERED:none  DRAINS: none   LOCAL MEDICATIONS USED:  NONE  SPECIMEN:  No Specimen  DISPOSITION OF SPECIMEN:  N/A  COUNTS:  YES  TOURNIQUET:  * No tourniquets in log *  DICTATION: .Dragon Dictation  PLAN OF CARE: Admit to inpatient   PATIENT DISPOSITION:  PACU - hemodynamically stable.   Delay start of Pharmacological VTE agent (>24hrs) due to surgical blood loss or risk of bleeding: yes

## 2016-11-05 NOTE — Op Note (Signed)
Date of procedure: 11/05/2016  Date of dictation: Same  Service: Neurosurgery  Preoperative diagnosis: C3-4 stenosis  Postoperative diagnosis: Same  Procedure Name: C3-C4 anterior cervical discectomy with interbody fusion utilizing interbody peek cage, locally harvested autograft, and anterior plate instrumentation  Surgeon:Fontella Shan A.Haide Klinker, M.D.  Asst. Surgeon: None  Anesthesia: General  Indication: 56 year old male with progressive neck pain bilateral upper extremity symptoms and gait disorder. Workup demonstrates evidence of significant stenosis with myelopathy at C3-4. Patient presents now for decompression infusion.  Operative note: After induction of anesthesia, patient position supine with neck slightly extended and held in place of halter traction. Anterior cervical region prepped and draped sterilely. Incision made overlying C3-4. Dissection performed on the right. Retractor placed. Fluoroscopy used. Level confirmed. Disc space and size. Discectomy performed with various instruments down to level posterior annulus. Microscope front field these were microdissection of the spinal canal. Remaining aspects of osteophytes and annulus removed using high-speed drill down to level posterior longitudinal ligament. Posterior longitudinal limb is an elevated and resected piecemeal fashion. Underlying thecal sac was identified. Wide central decompression was then performed by undercutting the bodies of C3 and C4. Decompression then proceeded into each neural foramen. Wide anterior foraminotomies were performed on the course exiting C4 nerve roots bilaterally. At this point a very thorough decompression had been achieved. There was no evidence of injury to the thecal sac or nerve roots. Wounds and irrigated and bike solution. Gelfoam was placed topically then removed. A 6 mm Metronic anatomic peek cage packed with locally harvested autograft was then impacted into place and recessed slightly from the  anterior cortical margin. 25 mm Atlantis anterior cervical plate was then placed over the C C3-4 level. Dissection attention or fluoroscopic guidance using 13 monitor very Screws 2 each at both levels. All 4 screws given a final tightening found be solidly the bone. Locking screws engaged. Final images revealed good position the bone graft and hardware at the proper upper level with normal alignment spine. Wounds and irrigated one final time. Hemostasis was assured with bipolar chart. Wounds and close in layers with Vicryl sutures. Steri-Strips sterile dressing were applied. No apparent complications. Patient tolerated the procedure well and he returns to the recovery room postop

## 2016-11-05 NOTE — Anesthesia Postprocedure Evaluation (Signed)
Anesthesia Post Note  Patient: BRENNER VISCONTI  Procedure(s) Performed: Procedure(s) (LRB): Anterior Cervical Discectomy and Fusion - Cervical three-Cervical four (N/A)     Patient location during evaluation: PACU Anesthesia Type: General Level of consciousness: awake and alert Pain management: pain level controlled Vital Signs Assessment: post-procedure vital signs reviewed and stable Respiratory status: spontaneous breathing, nonlabored ventilation and respiratory function stable Cardiovascular status: blood pressure returned to baseline and stable Postop Assessment: no apparent nausea or vomiting Anesthetic complications: no    Last Vitals:  Vitals:   11/05/16 1045 11/05/16 1110  BP: 127/81 (!) 162/93  Pulse: 73 (!) 53  Resp: 14 16  Temp: 36.8 C 36.5 C  SpO2: 95% 96%    Last Pain:  Vitals:   11/05/16 1015  TempSrc:   PainSc: Asleep                 Azlin Zilberman A.

## 2016-11-05 NOTE — H&P (Signed)
Dalton [name] is an 56 y.o. male.   Chief Complaint: Weakness HPI: 56 year old male with neck pain and bilateral upper and lower extremities weakness. Workup demonstrates evidence of a significant central disc herniation at C3-4 with marked spinal cord compression and spinal cord signal at about a. Patient presents now for anterior cervical decompression infusion in hopes of improving his symptoms.  Past Medical History:  Diagnosis Date  . Anemia   . Anxiety   . Bipolar disorder ([place])   . CHF (congestive heart failure) ([place])   . Chronic back pain    [facility] Clinic in [place]  . Chronic bronchitis   . Chronic lower back pain   . Congestive heart failure (CHF) ([place])   . Coronary artery disease   . Coughing   . Depression   . DVT (deep venous thrombosis) ([place]) ~ 2005   LLE  . Frequency of urination   . GERD (gastroesophageal reflux disease)   . 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 XIHWTUUE(280.0)    "a few times/week" (12/24/2015)  . High cholesterol   . HNP (herniated nucleus pulposus), cervical   . Hypertension   . Laceration of right hand 11/27/2010  . Laceration of wrist 2007 BIL FOREARMS  . MI (myocardial infarction) ([place])    7, last one was in 2011 (12/24/2015)  . Neuropathy   . NSAID-induced gastric ulcer    "Ibuprofen"  . PUD (peptic ulcer disease)    in 1990s, secondary to medication  . Rheumatoid arthritis ([place])    "all over" (12/24/2015)  . Shortness of breath    with exertion  . Tonsillitis, chronic    Dr. Vicki Mallet in [place]  . Type II diabetes mellitus ([place])   . Wears glasses     Past Surgical History:  Procedure Laterality Date  . BACK SURGERY     x3  . BIOPSY N/A 05/30/2012   Procedure: BIOPSY;  Surgeon: Danie Binder, MD;  Location: [facility] ORS;  Service: Endoscopy;  Laterality: N/A;  . BIOPSY  03/02/2016   Procedure: BIOPSY;  Surgeon: Danie Binder, MD;  Location: [facility] END[facility]  SU[facility];  Service: Endoscopy;;  gastric  . CARDIAC CATHETERIZATION  "several"  . CARPAL TUNNEL RELEASE Bilateral   . CATARACT EXTRACTION W/PHACO Right 06/15/2016   Procedure: CATARACT EXTRACTION PHACO AND INTRAOCULAR LENS PLACEMENT (IOC);  Surgeon: Rutherford Guys, MD;  Location: [facility];  Service: Ophthalmology;  Laterality: Right;  CDE: 4.64  . CATARACT EXTRACTION W/PHACO Left 07/13/2016   Procedure: CATARACT EXTRACTION PHACO AND INTRAOCULAR LENS PLACEMENT (IOC);  Surgeon: Rutherford Guys, MD;  Location: [facility] OR[facility];  Service: Ophthalmology;  Laterality: Left;  CDE: 3.15  . COLONOSCOPY  12/28/2010   SLF: (MAC)Internal hemorrhoids/four small colon polyps tubular adenomas. per SLF: colonoscopy 2022  . CORONARY ARTERY BYPASS GRAFT  2002   3 vessels  . ESOPHAGOGASTRODUODENOSCOPY N/A 05/30/2012   SLF: UNCONTROLLED GERD DUE TO LIFESTYLE CHOICE/WEIGHT GAIN/MILD Non-erosive gastritis  . ESOPHAGOGASTRODUODENOSCOPY (EGD) WITH PROPOFOL N/A 03/02/2016   Dr. Oneida Alar: Esophagus appeared normal, impaired dilation performed, patchy inflammation with edema and erythema of the entire stomach., Biopsy with H pylori, patient completed Pylera.   [name] Kitchen FRACTURE SURGERY    . LEFT HEART CATHETERIZATION WITH CORONARY ANGIOGRAM N/A 08/31/2011   Procedure: LEFT HEART CATHETERIZATION WITH CORONARY ANGIOGRAM;  Surgeon: Laverda Page, MD;  Location: [facility] CATH LAB;  Service: Cardiovascular;  Laterality: N/A;  . LUMBAR DISC SURGERY     "  L4-5; Dr. Trenton Gammon"  . MULTIPLE TOOTH EXTRACTIONS    . ORIF MANDIBULAR FRACTURE N/A 12/19/2015   Procedure: OPEN REDUCTION INTERNAL FIXATION (ORIF) MANDIBULAR FRACTURE;  Surgeon: Izora Gala, MD;  Location: [place];  Service: ENT;  Laterality: N/A;  . PATELLA FRACTURE SURGERY Left 1976   plate to knee cap from accident  . SAVORY DILATION  12/28/2010   SLF:(MAC)J-shaped stomach/nodular mocosa in the distal esophagus/empiric dilation 89m  . SAVORY DILATION N/A 03/02/2016   Procedure: SAVORY DILATION;  Surgeon: SDanie Binder MD;  [facility];  Service: Endoscopy;  Laterality: N/A;    Family History  Problem Relation Age of Onset  . Diabetes Mother   . Hypertension Mother   . Heart attack Mother 626 . Hypertension Father   . Diabetes Father   . Heart attack Father 575 . Heart attack Unknown        mother, father, brother, sister all deceased due to MI  . Heart attack Sister   . Heart attack Brother   . Seizures Brother   . Heart failure Other   . Colon cancer Neg Hx   . Liver disease Neg Hx   . Anesthesia problems Neg Hx   . Hypotension Neg Hx   . Malignant hyperthermia Neg Hx   . Pseudochol deficiency Neg Hx   . Colon polyps Neg Hx    Social History:  reports that he has never smoked. He has never used smokeless tobacco. He reports that he does not drink alcohol or use drugs.  Allergies:  Allergies  Allergen Reactions  . Gabapentin Hives  . Ibuprofen Other (See Comments)    REACTION:Stomach  Upset and stomach ulcers  . Zolpidem Tartrate Other (See Comments)    REACTION: Hallucinations   . Naproxen Other (See Comments)    HALLUCINATIONS    Medications Prior to Admission  Medication Sig Dispense Refill  . albuterol (PROVENTIL HFA;VENTOLIN HFA) 108 (90 Base) MCG/ACT inhaler Inhale 1-2 puffs into the lungs every 6 (six) hours as needed for wheezing or shortness of breath.    . alprazolam (XANAX) 2 MG tablet Take 2 mg by mouth 2 (two) times daily.    .[name] Kitchenaspirin EC 81 MG tablet Take 81 mg by mouth at bedtime.    .[name] Kitchenatorvastatin (LIPITOR) 40 MG tablet Take 1 tablet (40 mg total) by mouth daily. 90 tablet 1  . beclomethasone (QVAR) 80 MCG/ACT inhaler Inhale 2 puffs into the lungs 2 (two) times daily.    . cetirizine (ZYRTEC) 10 MG tablet Take 10 mg by mouth daily as needed for allergies.   0  . dexlansoprazole (DEXILANT) 60 MG capsule Take 1 capsule (60 mg total) by mouth daily. 30 capsule 5  . fluticasone (FLONASE) 50 MCG/ACT nasal spray Place 2 sprays into both nostrils daily  as needed for allergies.     . furosemide (LASIX) 40 MG tablet Take 40 mg by mouth daily as needed for fluid.     .[name] KitchenglipiZIDE (GLUCOTROL) 10 MG tablet Take 10 mg by mouth daily before breakfast.    . hydrochlorothiazide (HYDRODIURIL) 25 MG tablet Take 1 tablet (25 mg total) by mouth daily. 30 tablet 0  . ipratropium (ATROVENT) 0.02 % nebulizer solution Take 0.5 mg by nebulization 2 (two) times daily as needed (for congestion).    . isosorbide mononitrate (IMDUR) 30 MG 24 hr tablet Take 1 tablet (30 mg total) by mouth daily. 90 tablet 1  . lisinopril (PRINIVIL,ZESTRIL) 10 MG tablet  Take 10 mg by mouth daily.    [name] Kitchen LYRICA 100 MG capsule Take 100 mg by mouth 2 (two) times daily as needed (pain).   0  . meclizine (ANTIVERT) 25 MG tablet Take 25 mg by mouth 3 (three) times daily as needed for dizziness.    . metFORMIN (GLUCOPHAGE) 500 MG tablet Take 1 tablet (500 mg total) by mouth daily with breakfast. (Patient taking differently: Take 500 mg by mouth 2 (two) times daily with a meal. ) 30 tablet 0  . metoprolol tartrate (LOPRESSOR) 25 MG tablet Take 0.5 tablets (12.5 mg total) by mouth 2 (two) times daily. 30 tablet 0  . oxyCODONE-acetaminophen (PERCOCET) 10-325 MG tablet Take 1 tablet by mouth every 4 (four) hours as needed for pain.    . potassium chloride (K-DUR,KLOR-CON) 10 MEQ tablet Take 1 tablet (10 mEq total) by mouth daily. 30 tablet 0  . sertraline (ZOLOFT) 100 MG tablet Take 100 mg by mouth daily.    . sitaGLIPtin (JANUVIA) 100 MG tablet Take 100 mg by mouth daily.    . tamsulosin (FLOMAX) 0.4 MG CAPS capsule Take 1 capsule (0.4 mg total) by mouth daily after breakfast. 30 capsule 0  . ergocalciferol (VITAMIN D2) 50000 units capsule Take 50,000 Units by mouth every 30 (thirty) days.    . nitroGLYCERIN (NITROSTAT) 0.4 MG SL tablet Place 0.4 mg under the tongue every 5 (five) minutes as needed for chest pain.    . ranitidine (ZANTAC) 300 MG tablet take 1 tablet by mouth at bedtime 30 tablet 5     Results for orders placed or performed during the hospital encounter of 11/05/16 (from the past 48 hour(s))  Glucose, capillary     Status: None   Collection Time: 11/05/16  6:14 AM  Result Value Ref Range   Glucose-Capillary 91 65 - 99 mg/dL   Comment 1 Notify RN    Comment 2 Document in Chart    No results found.  Pertinent items noted in HPI and remainder of comprehensive ROS otherwise negative.  Blood pressure 139/86, pulse 64, temperature 98.2 F (36.8 C), temperature source Oral, resp. rate 20, weight 116.1 kg (256 lb), SpO2 100 %.  Patient is awake and alert. He is oriented and reasonably appropriate. Cranial nerve function is intact. Motor examination with diffuse bilateral upper and lower extremity weakness with some spasticity. Deep tender Roxicet hyperactive. Hoffmann's response is present in both hands. Examination head ears eyes and throat are marked. Chest and abdomen are benign. Extremities are free from injury deformity. Assessment/Plan C3-4 stenosis with myelopathy. Plan C3-4 anterior cervical discectomy with interbody fusion utilizing interbody cage, locally harvested autograft, and anterior plate is patient. Risks and benefits of been explained. Patient wishes to proceed.  Srikar Chiang A 11/05/2016, 7:53 AM

## 2016-11-06 LAB — GLUCOSE, CAPILLARY: Glucose-Capillary: 160 mg/dL — ABNORMAL HIGH (ref 65–99)

## 2016-11-06 MED ORDER — OXYCODONE-ACETAMINOPHEN 10-325 MG PO TABS: 1.0000 | ORAL_TABLET | Freq: Four times a day (QID) | ORAL | 0 refills | Status: DC | PRN

## 2016-11-06 MED ORDER — CYCLOBENZAPRINE HCL 10 MG PO TABS: 10.0000 mg | ORAL_TABLET | Freq: Three times a day (TID) | ORAL | 3 refills | Status: DC | PRN

## 2016-11-06 NOTE — Discharge Summary (Signed)
Discharge summary: Date of admission: 11/05/2016 Date of discharge: 11/06/2016 Condition on discharge: Improved Admitting diagnosis: Cervical spondylosis with stenosis C3-C4, myelopathy Discharge and final diagnosis: Cervical spondylosis with stenosis C3-C4, myelopathy.   Hospital course: Patient was admitted to undergo anterior decompression arthrodesis at C3-C4. Postoperatively he had some soreness in the shoulders and some difficulty swallowing. This is improved substantially. He is discharged home.  Discharge medications: Percocet 11/323 #40 without refills Flexeril 10 mg #40 with 3 refills.

## 2016-11-06 NOTE — Progress Notes (Signed)
Patient is discharged from room 3C07 at this time. Alert and in stable condition. IV site d/c'd and instructions read to patient with understanding verbalized. Left unit via wheelchair with all belongings at side. 

## 2016-11-08 ENCOUNTER — Encounter (HOSPITAL_COMMUNITY): Payer: Self-pay | Admitting: Neurosurgery

## 2016-11-25 ENCOUNTER — Ambulatory Visit: Payer: Self-pay | Admitting: Internal Medicine

## 2016-12-13 ENCOUNTER — Other Ambulatory Visit: Payer: Self-pay | Admitting: Internal Medicine

## 2016-12-13 NOTE — Telephone Encounter (Signed)
Refill Request.  

## 2017-01-31 ENCOUNTER — Ambulatory Visit: Payer: Medicare Other | Admitting: Gastroenterology

## 2017-03-25 ENCOUNTER — Encounter: Payer: Self-pay | Admitting: Gastroenterology

## 2017-03-25 ENCOUNTER — Ambulatory Visit: Payer: Medicare Other | Admitting: Gastroenterology

## 2017-03-25 ENCOUNTER — Telehealth: Payer: Self-pay | Admitting: Gastroenterology

## 2017-03-25 NOTE — Telephone Encounter (Signed)
Patient was a no show and letter sent  °

## 2017-04-29 ENCOUNTER — Ambulatory Visit: Payer: Medicare Other | Admitting: Internal Medicine

## 2017-04-29 NOTE — Progress Notes (Deleted)
Follow-up Outpatient Visit Date: 04/29/2017  Primary Care Provider: Nolene Ebbs, MD [address] [place] [address]  Chief Complaint: ***  HPI:  Dalton [name] is a 57 y.o. year-old male with history of coronary artery disease with multiple prior MIs status post single vessel CABG (LIMA LAD), cardiomyopathy, PAD status post left subclavian stent, DVT, hypertension, hyperlipidemia, type 2 diabetes mellitus, rheumatoid arthritis, peptic ulcer disease, bipolar disorder, and cocaine use, who presents for follow-up of CAD and cardiomyopathy. I last saw him 03/2016. At that time, he reported intermittent sharp chest pain with preceding myocardial perfusion stress test 3 months earlier showing no evidence of ischemia. Most recent catheterization had also shown an atretic LIMA but no significant CAD. We agreed to add isosorbide mononitrate 30 mg daily for possible microvascular dysfunction. He was seen for preoperative risk assessment by Richardson Dopp, PA, in 10/2016.  --------------------------------------------------------------------------------------------------  Cardiovascular History & Procedures: Cardiovascular Problems:  Coronary artery disease s/p single-vessel CABG (8101)  Diastolic heart failure  Risk Factors:  Known CAD, hypertension, hyperlipidemia, obesity, male gender, and history of substance abuse  Cath/PCI:  LHC (08/31/11, Dr. Einar Gip): LMCA normal. LAD normal. LAD normal. RCA tortuous but otherwise normal. LVEF 55-60%. LVEDP 19 mmHg. Patent left subclavian artery stent with atretic LIMA.  LHC (10/24/08): LMCA normal. LAD with midvessel bridge and 40% stenosis in systole. Ostial LAD with 30% stenosis. LCx with mild luminal irregularities. RCA with mild luminal irregularities. Patent left subclavian artery stent. LIMA -> LAD proximally occluded.  CV Surgery:  CABG (2000): LIMA -> LAD  EP Procedures and Devices:  None  Non-Invasive Evaluation(s):  Pharmacologic  myocardial perfusion stress test (12/25/15): Low risk study without ischemia or scar. LVEF 54% with normal wall motion.  TTE (11/14/15): Normal LV size and systolic function (EF 75-10%). Normal wall motion. Grade 2 diastolic dysfunction. Aortic sclerosis without stenosis. Normal RV size and function.  Recent CV Pertinent Labs: Lab Results  Component Value Date   CHOL 196 07/07/2016   HDL 42 07/07/2016   LDLCALC 116 (H) 07/07/2016   TRIG 192 (H) 07/07/2016   CHOLHDL 4.7 07/07/2016   CHOLHDL 6.4 12/16/2015   INR 1.08 12/10/2015   K 3.5 10/28/2016   MG 1.8 11/16/2015   BUN 9 10/28/2016   CREATININE 0.89 10/28/2016    Past medical and surgical history were reviewed and updated in EPIC.  No outpatient medications have been marked as taking for the 04/29/17 encounter (Appointment) with Marilynne Dupuis, Dalton Gave, MD.    Allergies: Gabapentin; Ibuprofen; Zolpidem tartrate; and Naproxen  Social History   Socioeconomic History  . Marital status: Single    Spouse name: Not on file  . Number of children: 2  . Years of education: Not on file  . Highest education level: Not on file  Social Needs  . Financial resource strain: Not on file  . Food insecurity - worry: Not on file  . Food insecurity - inability: Not on file  . Transportation needs - medical: Not on file  . Transportation needs - non-medical: Not on file  Occupational History  . Occupation: disabled    Fish farm manager: NOT EMPLOYED  Tobacco Use  . Smoking status: Never Smoker  . Smokeless tobacco: Never Used  Substance and Sexual Activity  . Alcohol use: No    Alcohol/week: 0.0 oz  . Drug use: No    Comment: clean x6 years  . Sexual activity: Not Currently  Other Topics Concern  . Not on file  Social History Narrative  Lives w/ son-23/22    Family History  Problem Relation Age of Onset  . Diabetes Mother   . Hypertension Mother   . Heart attack Mother 54  . Hypertension Father   . Diabetes Father   . Heart attack Father 37   . Heart attack Unknown        mother, father, brother, sister all deceased due to MI  . Heart attack Sister   . Heart attack Brother   . Seizures Brother   . Heart failure Other   . Colon cancer Neg Hx   . Liver disease Neg Hx   . Anesthesia problems Neg Hx   . Hypotension Neg Hx   . Malignant hyperthermia Neg Hx   . Pseudochol deficiency Neg Hx   . Colon polyps Neg Hx     Review of Systems: A 12-system review of systems was performed and was negative except as noted in the HPI.  --------------------------------------------------------------------------------------------------  Physical Exam: There were no vitals taken for this visit.  General:  *** HEENT: No conjunctival pallor or scleral icterus. Moist mucous membranes.  OP clear. Neck: Supple without lymphadenopathy, thyromegaly, JVD, or HJR. No carotid bruit. Lungs: Normal work of breathing. Clear to auscultation bilaterally without wheezes or crackles. Heart: Regular rate and rhythm without murmurs, rubs, or gallops. Non-displaced PMI. Abd: Bowel sounds present. Soft, NT/ND without hepatosplenomegaly Ext: No lower extremity edema. Radial, PT, and DP pulses are 2+ bilaterally. Skin: Warm and dry without rash.  EKG:  ***  Lab Results  Component Value Date   WBC 4.9 10/28/2016   HGB 12.5 (L) 10/28/2016   HCT 37.1 (L) 10/28/2016   MCV 85.7 10/28/2016   PLT 180 10/28/2016    Lab Results  Component Value Date   NA 139 10/28/2016   K 3.5 10/28/2016   CL 103 10/28/2016   CO2 27 10/28/2016   BUN 9 10/28/2016   CREATININE 0.89 10/28/2016   GLUCOSE 95 10/28/2016   ALT 13 07/07/2016    Lab Results  Component Value Date   CHOL 196 07/07/2016   HDL 42 07/07/2016   LDLCALC 116 (H) 07/07/2016   TRIG 192 (H) 07/07/2016   CHOLHDL 4.7 07/07/2016    --------------------------------------------------------------------------------------------------  ASSESSMENT AND PLAN: Dalton Gave Whittley Carandang, MD 04/29/2017 7:24  AM

## 2017-05-12 ENCOUNTER — Encounter: Payer: Self-pay | Admitting: Nurse Practitioner

## 2017-05-12 ENCOUNTER — Telehealth: Payer: Self-pay | Admitting: *Deleted

## 2017-05-12 ENCOUNTER — Ambulatory Visit (INDEPENDENT_AMBULATORY_CARE_PROVIDER_SITE_OTHER): Payer: Medicare Other | Admitting: Nurse Practitioner

## 2017-05-12 ENCOUNTER — Other Ambulatory Visit: Payer: Self-pay

## 2017-05-12 VITALS — BP 133/82 | HR 77 | Temp 97.3°F | Ht 70.0 in | Wt 252.6 lb

## 2017-05-12 DIAGNOSIS — K625 Hemorrhage of anus and rectum: Secondary | ICD-10-CM

## 2017-05-12 DIAGNOSIS — D126 Benign neoplasm of colon, unspecified: Secondary | ICD-10-CM

## 2017-05-12 DIAGNOSIS — K5900 Constipation, unspecified: Secondary | ICD-10-CM | POA: Diagnosis not present

## 2017-05-12 MED ORDER — PEG 3350-KCL-NA BICARB-NACL 420 G PO SOLR: 4000.0000 mL | ORAL | 0 refills | Status: DC

## 2017-05-12 NOTE — Progress Notes (Signed)
CC'ED TO PCP 

## 2017-05-12 NOTE — Telephone Encounter (Signed)
Pre-op scheduled for 06/28/17 at 10:00am. Patient aware. Letter mailed.

## 2017-05-12 NOTE — Progress Notes (Signed)
Referring Provider: Nolene Ebbs, MD Primary Care Physician:  Nolene Ebbs, MD Primary GI:  Dr. Oneida Alar  Chief Complaint  Patient presents with  . Colonoscopy    consult  . Blood In Stools    last happened last week    HPI:   Dalton [name] is a 57 y.o. male who presents to schedule colonoscopy and for blood in his stools.  The patient was last seen in our office 10/20/2016 for dysphasia, colon adenomas, GERD, H. pylori gastritis.  At that time he was noted he had history of constipation with hard stools twice a week, diarrhea 1-2 times a month, heartburn 1-2 times a week.  No other GI symptoms.  Recommend continue weight loss efforts, add Zantac, avoid triggers, continue Dexilant daily.  Schedule colonoscopy before the end of the year and follow-up in 6 months.  Last colonoscopy completed 12/28/2010 for average risk screening.  Settings included internal hemorrhoids and for small colon polyps which were found to be tubular adenoma and hyperplastic.  Recommended 10-year repeat exam (2020).  His most recent schedule office visit he was a no-show.  Today he states he's doing ok. Has had a couple episodes of rectal bleeding last month and again this month. Some constipation. Using MiraLAX. He is currently out, no constipation when he was taking it every other day. Bleeding occurs when constipated at times, other times when not constipation. Blood in the commode. Bleeding at times associated with abdominal pain located lower abdomen, typically resolves with passing gas or having a bowel movement. Poor appetite. Denies N/V, melena, fever, chills, unintentional weight loss. Denies chest pain, dyspnea, dizziness, lightheadedness, syncope, near syncope. Denies any other upper or lower GI symptoms.  He is a little upset today. He woke up yesterday morning and found his brother dead in the other bedroom likely from known heart disease.  Past Medical History:  Diagnosis Date  . Anemia   .  Anxiety   . Bipolar disorder ([place])   . CHF (congestive heart failure) ([place])   . Chronic back pain    Pain Clinic in [place]  . Chronic bronchitis   . Chronic lower back pain   . Congestive heart failure (CHF) ([place])   . Coronary artery disease   . Coughing   . Depression   . DVT (deep venous thrombosis) ([place]) ~ 2005   LLE  . Frequency of urination   . GERD (gastroesophageal reflux disease)   . 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 DVVOHYWV(371.0)    "a few times/week" (12/24/2015)  . High cholesterol   . HNP (herniated nucleus pulposus), cervical   . Hypertension   . Laceration of right hand 11/27/2010  . Laceration of wrist 2007 BIL FOREARMS  . MI (myocardial infarction) ([place])    7, last one was in 2011 (12/24/2015)  . Neuropathy   . NSAID-induced gastric ulcer    "Ibuprofen"  . PUD (peptic ulcer disease)    in 1990s, secondary to medication  . Rheumatoid arthritis ([place])    "all over" (12/24/2015)  . Shortness of breath    with exertion  . Tonsillitis, chronic    Dr. Vicki Mallet in [place]  . Type II diabetes mellitus ([place])   . Wears glasses     Past Surgical History:  Procedure Laterality Date  . ANTERIOR CERVICAL DECOMP/DISCECTOMY FUSION N/A 11/05/2016   Procedure: Anterior Cervical Discectomy and Fusion - Cervical three-Cervical  four;  Surgeon: Earnie Larsson, MD;  Location: [place];  Service: Neurosurgery;  Laterality: N/A;  . BACK SURGERY     x3  . BIOPSY N/A 05/30/2012   Procedure: BIOPSY;  Surgeon: Danie Binder, MD;  Location: [facility];  Service: Endoscopy;  Laterality: N/A;  . BIOPSY  03/02/2016   Procedure: BIOPSY;  Surgeon: Danie Binder, MD;  [facility];  Service: Endoscopy;;  gastric  . CARDIAC CATHETERIZATION  "several"  . CARPAL TUNNEL RELEASE Bilateral   . CATARACT EXTRACTION W/PHACO Right 06/15/2016   Procedure: CATARACT EXTRACTION PHACO AND INTRAOCULAR LENS PLACEMENT  (IOC);  Surgeon: Rutherford Guys, MD;  Location: [facility];  Service: Ophthalmology;  Laterality: Right;  CDE: 4.64  . CATARACT EXTRACTION W/PHACO Left 07/13/2016   Procedure: CATARACT EXTRACTION PHACO AND INTRAOCULAR LENS PLACEMENT (IOC);  Surgeon: Rutherford Guys, MD;  Location: [facility];  Service: Ophthalmology;  Laterality: Left;  CDE: 3.15  . COLONOSCOPY  12/28/2010   SLF: (MAC)Internal hemorrhoids/four small colon polyps tubular adenomas. per SLF: colonoscopy 2022  . CORONARY ARTERY BYPASS GRAFT  2002   3 vessels  . ESOPHAGOGASTRODUODENOSCOPY N/A 05/30/2012   SLF: UNCONTROLLED GERD DUE TO LIFESTYLE CHOICE/WEIGHT GAIN/MILD Non-erosive gastritis  . ESOPHAGOGASTRODUODENOSCOPY (EGD) WITH PROPOFOL N/A 03/02/2016   Dr. Oneida Alar: Esophagus appeared normal, impaired dilation performed, patchy inflammation with edema and erythema of the entire stomach., Biopsy with H pylori, patient completed Pylera.   [name] Kitchen FRACTURE SURGERY    . LEFT HEART CATHETERIZATION WITH CORONARY ANGIOGRAM N/A 08/31/2011   Procedure: LEFT HEART CATHETERIZATION WITH CORONARY ANGIOGRAM;  Surgeon: Laverda Page, MD;  [facility] CATH LAB;  Service: Cardiovascular;  Laterality: N/A;  . LUMBAR DISC SURGERY     "L4-5; Dr. Trenton Gammon"  . MULTIPLE TOOTH EXTRACTIONS    . ORIF MANDIBULAR FRACTURE N/A 12/19/2015   Procedure: OPEN REDUCTION INTERNAL FIXATION (ORIF) MANDIBULAR FRACTURE;  Surgeon: Izora Gala, MD;  Location: [place];  Service: ENT;  Laterality: N/A;  . PATELLA FRACTURE SURGERY Left 1976   plate to knee cap from accident  . SAVORY DILATION  12/28/2010   SLF:(MAC)J-shaped stomach/nodular mocosa in the distal esophagus/empiric dilation 25m  . SAVORY DILATION N/A 03/02/2016   Procedure: SAVORY DILATION;  Surgeon: SDanie Binder MD;  [facility];  Service: Endoscopy;  Laterality: N/A;    Current Outpatient Medications  Medication Sig Dispense Refill  . albuterol (PROVENTIL HFA;VENTOLIN HFA) 108 (90 Base) MCG/ACT inhaler Inhale 1-2  puffs into the lungs every 6 (six) hours as needed for wheezing or shortness of breath.    . alprazolam (XANAX) 2 MG tablet Take 2 mg by mouth 2 (two) times daily.    .[name] Kitchenaspirin EC 81 MG tablet Take 81 mg by mouth at bedtime.    .[name] Kitchenatorvastatin (LIPITOR) 40 MG tablet Take 1 tablet (40 mg total) by mouth daily. 90 tablet 1  . beclomethasone (QVAR) 80 MCG/ACT inhaler Inhale 2 puffs into the lungs 2 (two) times daily.    . cetirizine (ZYRTEC) 10 MG tablet Take 10 mg by mouth daily as needed for allergies.   0  . dexlansoprazole (DEXILANT) 60 MG capsule Take 1 capsule (60 mg total) by mouth daily. 30 capsule 5  . ergocalciferol (VITAMIN D2) 50000 units capsule Take 50,000 Units by mouth every 30 (thirty) days.    . fluticasone (FLONASE) 50 MCG/ACT nasal spray Place 2 sprays into both nostrils daily as needed for allergies.     . furosemide (LASIX) 40 MG tablet  Take 40 mg by mouth daily as needed for fluid.     [name] Kitchen glipiZIDE (GLUCOTROL) 10 MG tablet Take 10 mg by mouth daily before breakfast.    . hydrochlorothiazide (HYDRODIURIL) 25 MG tablet Take 1 tablet (25 mg total) by mouth daily. 30 tablet 0  . ipratropium (ATROVENT) 0.02 % nebulizer solution Take 0.5 mg by nebulization 2 (two) times daily as needed (for congestion).    . isosorbide mononitrate (IMDUR) 30 MG 24 hr tablet Take 1 tablet (30 mg total) by mouth daily. 90 tablet 1  . lisinopril (PRINIVIL,ZESTRIL) 10 MG tablet Take 10 mg by mouth daily.    [name] Kitchen LYRICA 100 MG capsule Take 100 mg by mouth 2 (two) times daily as needed (pain).   0  . meclizine (ANTIVERT) 25 MG tablet Take 25 mg by mouth 3 (three) times daily as needed for dizziness.    . metFORMIN (GLUCOPHAGE) 500 MG tablet Take 1 tablet (500 mg total) by mouth daily with breakfast. (Patient taking differently: Take 500 mg by mouth 2 (two) times daily with a meal. ) 30 tablet 0  . metoprolol tartrate (LOPRESSOR) 25 MG tablet Take 0.5 tablets (12.5 mg total) by mouth 2 (two) times daily. 30  tablet 0  . nitroGLYCERIN (NITROSTAT) 0.4 MG SL tablet Place 0.4 mg under the tongue every 5 (five) minutes as needed for chest pain.    [name] Kitchen oxyCODONE-acetaminophen (PERCOCET) 10-325 MG tablet Take 1 tablet by mouth every 4 (four) hours as needed for pain.    . potassium chloride (K-DUR,KLOR-CON) 10 MEQ tablet Take 1 tablet (10 mEq total) by mouth daily. 30 tablet 0  . ranitidine (ZANTAC) 300 MG tablet take 1 tablet by mouth at bedtime 30 tablet 5  . sertraline (ZOLOFT) 100 MG tablet Take 100 mg by mouth daily.    . sitaGLIPtin (JANUVIA) 100 MG tablet Take 100 mg by mouth daily.    . tamsulosin (FLOMAX) 0.4 MG CAPS capsule Take 1 capsule (0.4 mg total) by mouth daily after breakfast. 30 capsule 0   No current facility-administered medications for this visit.     Allergies as of 05/12/2017 - Review Complete 05/12/2017  Allergen Reaction Noted  . Gabapentin Hives   . Ibuprofen Other (See Comments)   . Zolpidem tartrate Other (See Comments)   . Naproxen Other (See Comments)     Family History  Problem Relation Age of Onset  . Diabetes Mother   . Hypertension Mother   . Heart attack Mother 74  . Hypertension Father   . Diabetes Father   . Heart attack Father 71  . Heart attack Unknown        mother, father, brother, sister all deceased due to MI  . Heart attack Sister   . Heart attack Brother   . Seizures Brother   . Heart failure Other   . Colon cancer Neg Hx   . Liver disease Neg Hx   . Anesthesia problems Neg Hx   . Hypotension Neg Hx   . Malignant hyperthermia Neg Hx   . Pseudochol deficiency Neg Hx   . Colon polyps Neg Hx     Social History   Socioeconomic History  . Marital status: Single    Spouse name: Not on file  . Number of children: 2  . Years of education: Not on file  . Highest education level: Not on file  Occupational History  . Occupation: disabled    Fish farm manager: NOT EMPLOYED  Social Needs  . Emergency planning/management officer  strain: Not on file  . Food insecurity:      Worry: Not on file    Inability: Not on file  . Transportation needs:    Medical: Not on file    Non-medical: Not on file  Tobacco Use  . Smoking status: Never Smoker  . Smokeless tobacco: Never Used  Substance and Sexual Activity  . Alcohol use: No    Alcohol/week: 0.0 oz  . 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 Sexual activity: Not Currently  Lifestyle  . Physical activity:    Days per week: Not on file    Minutes per session: Not on file  . Stress: Not on file  Relationships  . Social connections:    Talks on phone: Not on file    Gets together: Not on file    Attends religious service: Not on file    Active member of club or organization: Not on file    Attends meetings of clubs or organizations: Not on file    Relationship status: Not on file  Other Topics Concern  . Not on file  Social History Narrative   Lives w/ son-23/22    Review of Systems: General: Negative for anorexia, weight loss, fever, chills, fatigue, weakness. Eyes: Negative for vision changes.  ENT: Negative for hoarseness, difficulty swallowing , nasal congestion. CV: Negative for chest pain, angina, palpitations, dyspnea on exertion, peripheral edema.  Respiratory: Negative for dyspnea at rest, dyspnea on exertion, cough, sputum, wheezing.  GI: See history of present illness. GU:  Negative for dysuria, hematuria, urinary incontinence, urinary frequency, nocturnal urination.  MS: Negative for joint pain, low back pain.  Derm: Negative for rash or itching.  Neuro: Negative for weakness, abnormal sensation, seizure, frequent headaches, memory loss, confusion.  Psych: Negative for anxiety, depression, suicidal ideation, hallucinations.  Endo: Negative for unusual weight change.  Heme: Negative for bruising or bleeding. Allergy: Negative for rash or hives.   Physical Exam: BP 133/82   Pulse 77   Temp (!) 97.3 F (36.3 C)  (Oral)   Ht 5' 10"  (1.778 m)   Wt 252 lb 9.6 oz (114.6 kg)   BMI 36.24 kg/m  General:   Alert and oriented. Pleasant and cooperative. Well-nourished and well-developed.  Head:  Normocephalic and atraumatic. Eyes:  Without icterus, sclera clear and conjunctiva pink.  Ears:  Normal auditory acuity. Mouth:  No deformity or lesions, oral mucosa pink.  Throat/Neck:  Supple, without mass or thyromegaly. Cardiovascular:  S1, S2 present without murmurs appreciated. Normal pulses noted. Extremities without clubbing or edema. Respiratory:  Clear to auscultation bilaterally. No wheezes, rales, or rhonchi. No distress.  Gastrointestinal:  +BS, soft, non-tender and non-distended. No HSM noted. No guarding or rebound. No masses appreciated.  Rectal:  Deferred  Musculoskalatal:  Symmetrical without gross deformities. Normal posture. Skin:  Intact without significant lesions or rashes. Neurologic:  Alert and oriented x4;  grossly normal neurologically. Psych:  Alert and cooperative. Normal mood and affect. Heme/Lymph/Immune: No significant cervical adenopathy. No excessive bruising noted.    05/12/2017 8:47 AM   Disclaimer: This note was dictated with voice recognition software. Similar sounding words can inadvertently be transcribed and may not be corrected upon review.

## 2017-05-12 NOTE — Patient Instructions (Signed)
1. Start using MiraLAX every other day again to help prevent constipation. 2. We will schedule your procedure for you. 3. Follow-up in 3 months. 4. Call us if you have any questions or concerns.   Again, I am so sorry to hear about your brother!!

## 2017-05-12 NOTE — Assessment & Plan Note (Signed)
Patient previously well managed on every other day MiraLAX.  He is run out of MiraLAX.  I will attempt to give him samples for now.  Recommend he restart MiraLAX to prevent constipation.  Follow-up in 3 months.

## 2017-05-12 NOTE — Assessment & Plan Note (Signed)
History of tubular adenoma.  Also having rectal bleeding.  He is due for colonoscopy at this time.  Proceed with colonoscopy on propofol/MAC with Dr. Oneida Alar in the near future. The risks, benefits, and alternatives have been discussed in detail with the patient. They state understanding and desire to proceed.   Patient is currently on Xanax, Zoloft, Flexeril, Percocet.  No other anticoagulants, anxiolytics, chronic pain medications, or antidepressants.  We will plan for the procedure on propofol/MAC to promote adequate sedation.

## 2017-05-12 NOTE — Assessment & Plan Note (Signed)
Approximately 4 episodes of rectal bleeding in the past 2 months in the setting of constipation, out of MiraLAX.  Further constipation measures as per below.  Differentials include benign anorectal source, bleeding polyp, less likely colorectal cancer or inflammatory bowel disease.  He is due for colonoscopy at this time we will proceed as per below.  Follow-up in 3 months.

## 2017-05-27 ENCOUNTER — Ambulatory Visit: Payer: Self-pay | Admitting: Internal Medicine

## 2017-06-17 ENCOUNTER — Encounter: Payer: Self-pay | Admitting: Internal Medicine

## 2017-06-17 ENCOUNTER — Ambulatory Visit (INDEPENDENT_AMBULATORY_CARE_PROVIDER_SITE_OTHER): Payer: Medicare Other | Admitting: Internal Medicine

## 2017-06-17 ENCOUNTER — Encounter (INDEPENDENT_AMBULATORY_CARE_PROVIDER_SITE_OTHER): Payer: Self-pay

## 2017-06-17 VITALS — BP 118/82 | HR 66 | Ht 70.0 in | Wt 251.6 lb

## 2017-06-17 DIAGNOSIS — I25118 Atherosclerotic heart disease of native coronary artery with other forms of angina pectoris: Secondary | ICD-10-CM

## 2017-06-17 DIAGNOSIS — I5032 Chronic diastolic (congestive) heart failure: Secondary | ICD-10-CM | POA: Insufficient documentation

## 2017-06-17 DIAGNOSIS — I10 Essential (primary) hypertension: Secondary | ICD-10-CM | POA: Diagnosis not present

## 2017-06-17 DIAGNOSIS — E785 Hyperlipidemia, unspecified: Secondary | ICD-10-CM

## 2017-06-17 DIAGNOSIS — R0602 Shortness of breath: Secondary | ICD-10-CM | POA: Diagnosis not present

## 2017-06-17 LAB — COMPREHENSIVE METABOLIC PANEL
ALBUMIN: 4.4 g/dL (ref 3.5–5.5)
ALT: 14 IU/L (ref 0–44)
AST: 18 IU/L (ref 0–40)
Albumin/Globulin Ratio: 1.5 (ref 1.2–2.2)
Alkaline Phosphatase: 98 IU/L (ref 39–117)
BUN / CREAT RATIO: 9 (ref 9–20)
BUN: 9 mg/dL (ref 6–24)
Bilirubin Total: 0.3 mg/dL (ref 0.0–1.2)
CALCIUM: 9.9 mg/dL (ref 8.7–10.2)
CO2: 26 mmol/L (ref 20–29)
CREATININE: 1.05 mg/dL (ref 0.76–1.27)
Chloride: 99 mmol/L (ref 96–106)
GFR, EST AFRICAN AMERICAN: 92 mL/min/{1.73_m2} (ref >59)
GFR, EST NON AFRICAN AMERICAN: 80 mL/min/{1.73_m2} (ref >59)
GLOBULIN, TOTAL: 3 g/dL (ref 1.5–4.5)
GLUCOSE: 104 mg/dL — AB (ref 65–99)
Potassium: 3.8 mmol/L (ref 3.5–5.2)
SODIUM: 139 mmol/L (ref 134–144)
Total Protein: 7.4 g/dL (ref 6.0–8.5)

## 2017-06-17 LAB — LIPID PANEL
CHOLESTEROL TOTAL: 226 mg/dL — AB (ref 100–199)
Chol/HDL Ratio: 5.4 ratio — ABNORMAL HIGH (ref 0.0–5.0)
HDL: 42 mg/dL (ref >39)
LDL Calculated: 156 mg/dL — ABNORMAL HIGH (ref 0–99)
Triglycerides: 139 mg/dL (ref 0–149)
VLDL CHOLESTEROL CAL: 28 mg/dL (ref 5–40)

## 2017-06-17 MED ORDER — NITROGLYCERIN 0.4 MG SL SUBL: 0.4000 mg | SUBLINGUAL_TABLET | SUBLINGUAL | 3 refills | Status: DC | PRN

## 2017-06-17 MED ORDER — FUROSEMIDE 40 MG PO TABS: 40.0000 mg | ORAL_TABLET | Freq: Every day | ORAL | 3 refills | Status: DC

## 2017-06-17 MED ORDER — ISOSORBIDE MONONITRATE ER 30 MG PO TB24: ORAL_TABLET | ORAL | 3 refills | Status: DC

## 2017-06-17 NOTE — Patient Instructions (Addendum)
Medication Instructions:  START LASIX 40mg  daily, IF GAIN 2 lbs in a day or 5 lbs in a week take additional dose  Nitro as needed for chest pain  IMDUR 15 MG DAILY    -- If you need a refill on your cardiac medications before your next appointment, please call your pharmacy. --  Labwork: LIPID/CMET TODAY  Testing/Procedures: 2 weeks  Your physician has requested that you have an echocardiogram. Echocardiography is a painless test that uses sound waves to create images of your heart. It provides your doctor with information about the size and shape of your heart and how well your heart's chambers and valves are working. This procedure takes approximately one hour. There are no restrictions for this procedure.   Follow-Up: Your physician wants you to follow-up in: 3 months with Dr. Saunders Revel.    Thank you for choosing [facility]!!    Any Other Special Instructions Will Be Listed Below (If Applicable).

## 2017-06-17 NOTE — Progress Notes (Signed)
Follow-up Outpatient Visit Date: 06/17/2017  Primary Care Provider: Nolene Ebbs, MD [address] [place] [address]  Chief Complaint: Leg pain and swelling  HPI:  Dalton [name] is a 57 y.o. year-old male with history of coronary artery disease status post single-vessel CABG (LIMA to LAD; most recent cardiac catheterization showed atretic LIMA with normal native coronary arteries) and report of multiple prior MIs, cardiomyopathy, HTN, HLD, type 2 diabetes mellitus, rheumatoid arthritis, DVT, peptic ulcer disease, bipolar disorder, and cocaine use, who presents for follow-up of chronic cardiac conditions.  I saw him last in 03/2016 at which time he reported intermittent sharp chest pain at least 3-4 times a week.  The pain was not exertional.  Myocardial perfusion stress test 3 months earlier was normal; we therefore agreed to add isosorbide mononitrate for treatment of possible microvascular dysfunction and/or coronary vasospasm.  He was seen by Richardson Dopp, PA, in September for preoperative cardiovascular risk assessment.  Further testing was deferred.  Today, [name] reports that he has had increased swelling in his legs as well as exertional dyspnea.  This began about 2-1/2 months ago and has been accompanied by some orthopnea.  He feels like standing up and going outside helps his breathing.  He is also noticed some improvement with his rescue inhaler.  He notes that he has been under more stress over the last 1 to 2 months owing to his brother's death.  He has occasional chest pain, most often when lying down.  It improves when sitting up and drinking water.  He has not been taking isosorbide mononitrate regularly due to headaches.  He is using it on an as-needed basis for chest pain.  He is currently using furosemide 40 mg almost daily due to his fluid retention.  He denies palpitation and lightheadedness.  Mr. Kemmerer states that he is scheduled for prostate surgery on  07/05/2017.  However, epic indicates that he is slated for a colonoscopy on this day.  --------------------------------------------------------------------------------------------------  Cardiovascular History & Procedures: Cardiovascular Problems:  Coronary artery disease s/p single-vessel CABG (5009)  Diastolic heart failure  Risk Factors:  Known CAD, hypertension, hyperlipidemia, obesity, male gender, and history of substance abuse  Cath/PCI:  LHC (08/31/11, Dr. Einar Gip): LMCA normal. LAD normal. LAD normal. RCA tortuous but otherwise normal. LVEF 55-60%. LVEDP 19 mmHg. Patent left subclavian artery stent with atretic LIMA.  LHC (10/24/08): LMCA normal. LAD with midvessel bridge and 40% stenosis in systole. Ostial LAD with 30% stenosis. LCx with mild luminal irregularities. RCA with mild luminal irregularities. Patent left subclavian artery stent. LIMA -> LAD proximally occluded.  CV Surgery:  CABG (2000): LIMA -> LAD  EP Procedures and Devices:  None  Non-Invasive Evaluation(s):  Pharmacologic myocardial perfusion stress test (12/25/15): Low risk study without ischemia or scar. LVEF 54% with normal wall motion.  TTE (11/14/15): Normal LV size and systolic function (EF 38-18%). Normal wall motion. Grade 2 diastolic dysfunction. Aortic sclerosis without stenosis. Normal RV size and function.  Recent CV Pertinent Labs: Lab Results  Component Value Date   CHOL 226 (H) 06/17/2017   HDL 42 06/17/2017   LDLCALC 156 (H) 06/17/2017   TRIG 139 06/17/2017   CHOLHDL 5.4 (H) 06/17/2017   CHOLHDL 6.4 12/16/2015   INR 1.08 12/10/2015   K 3.8 06/17/2017   MG 1.8 11/16/2015   BUN 9 06/17/2017   CREATININE 1.05 06/17/2017    Past medical and surgical history were reviewed and updated in EPIC.  Current Meds  Medication  Sig  . albuterol (PROVENTIL HFA;VENTOLIN HFA) 108 (90 Base) MCG/ACT inhaler Inhale 1-2 puffs into the lungs every 6 (six) hours as needed for wheezing or shortness  of breath.  . alprazolam (XANAX) 2 MG tablet Take 2 mg by mouth 2 (two) times daily.  [name] Kitchen aspirin EC 81 MG tablet Take 81 mg by mouth at bedtime.  [name] Kitchen atorvastatin (LIPITOR) 40 MG tablet Take 1 tablet (40 mg total) by mouth daily.  . beclomethasone (QVAR) 80 MCG/ACT inhaler Inhale 2 puffs into the lungs 2 (two) times daily.  . cetirizine (ZYRTEC) 10 MG tablet Take 10 mg by mouth daily as needed for allergies.   [name] Kitchen dexlansoprazole (DEXILANT) 60 MG capsule Take 1 capsule (60 mg total) by mouth daily.  . fluticasone (FLONASE) 50 MCG/ACT nasal spray Place 2 sprays into both nostrils daily as needed for allergies.   [name] Kitchen glipiZIDE (GLUCOTROL) 10 MG tablet Take 10 mg by mouth daily before breakfast.  . hydrochlorothiazide (HYDRODIURIL) 25 MG tablet Take 1 tablet (25 mg total) by mouth daily.  [name] Kitchen ipratropium (ATROVENT) 0.02 % nebulizer solution Take 0.5 mg by nebulization 2 (two) times daily as needed (for congestion).  [name] Kitchen lisinopril (PRINIVIL,ZESTRIL) 10 MG tablet Take 10 mg by mouth daily.  [name] Kitchen LYRICA 100 MG capsule Take 100 mg by mouth 2 (two) times daily as needed (pain).   . meclizine (ANTIVERT) 25 MG tablet Take 25 mg by mouth 3 (three) times daily as needed for dizziness.  . metFORMIN (GLUCOPHAGE) 500 MG tablet Take 1 tablet (500 mg total) by mouth daily with breakfast. (Patient taking differently: Take 500 mg by mouth 2 (two) times daily with a meal. )  . metoprolol tartrate (LOPRESSOR) 25 MG tablet Take 0.5 tablets (12.5 mg total) by mouth 2 (two) times daily.  . nitroGLYCERIN (NITROSTAT) 0.4 MG SL tablet Place 0.4 mg under the tongue every 5 (five) minutes as needed for chest pain.  [name] Kitchen oxyCODONE-acetaminophen (PERCOCET) 10-325 MG tablet Take 1 tablet by mouth every 4 (four) hours as needed for pain.  . potassium chloride (K-DUR,KLOR-CON) 10 MEQ tablet Take 1 tablet (10 mEq total) by mouth daily.  . ranitidine (ZANTAC) 300 MG tablet take 1 tablet by mouth at bedtime  . sertraline (ZOLOFT) 100 MG tablet Take  100 mg by mouth daily.  . sitaGLIPtin (JANUVIA) 100 MG tablet Take 100 mg by mouth daily.  . tamsulosin (FLOMAX) 0.4 MG CAPS capsule Take 1 capsule (0.4 mg total) by mouth daily after breakfast.  . [DISCONTINUED] furosemide (LASIX) 40 MG tablet Take 40 mg by mouth daily as needed for fluid.     Allergies: Gabapentin; Ibuprofen; Zolpidem tartrate; and Naproxen  Social History   Tobacco Use  . Smoking status: Never Smoker  . Smokeless tobacco: Never Used  Substance Use Topics  . Alcohol use: No    Alcohol/week: 0.0 oz  . Drug use: No    Types: "Crack" cocaine, Cocaine, Marijuana    Comment: clean 10 years (as of 05/12/2017)    Family History  Problem Relation Age of Onset  . Diabetes Mother   . Hypertension Mother   . Heart attack Mother 61  . Hypertension Father   . Diabetes Father   . Heart attack Father 40  . Heart attack Unknown        mother, father, brother, sister all deceased due to MI  . Heart attack Sister   . Heart attack Brother   . Seizures Brother   . Heart failure Other   .  Colon cancer Neg Hx   . Liver disease Neg Hx   . Anesthesia problems Neg Hx   . Hypotension Neg Hx   . Malignant hyperthermia Neg Hx   . Pseudochol deficiency Neg Hx   . Colon polyps Neg Hx     Review of Systems: A 12-system review of systems was performed and was negative except as noted in the HPI.  --------------------------------------------------------------------------------------------------  Physical Exam: BP 118/82   Pulse 66   Ht 5\' 10"  (1.778 m)   Wt 251 lb 9.6 oz (114.1 kg)   BMI 36.10 kg/m   General: Obese man, seated comfortably in the exam room. HEENT: No conjunctival pallor or scleral icterus. Moist mucous membranes.  OP clear. Neck: Supple without lymphadenopathy, thyromegaly, JVD, or HJR. Lungs: Normal work of breathing. Clear to auscultation bilaterally without wheezes or crackles. Heart: Regular rate and rhythm without murmurs, rubs, or gallops.  Non-displaced PMI. Abd: Bowel sounds present. Soft, NT/ND without hepatosplenomegaly Ext: Trace LE edema. Skin: Warm and dry without rash.  EKG:  NSR with non-specific T wave abnormality in the inferior leads.  Lab Results  Component Value Date   WBC 4.9 10/28/2016   HGB 12.5 (L) 10/28/2016   HCT 37.1 (L) 10/28/2016   MCV 85.7 10/28/2016   PLT 180 10/28/2016    Lab Results  Component Value Date   NA 139 06/17/2017   K 3.8 06/17/2017   CL 99 06/17/2017   CO2 26 06/17/2017   BUN 9 06/17/2017   CREATININE 1.05 06/17/2017   GLUCOSE 104 (H) 06/17/2017   ALT 14 06/17/2017    Lab Results  Component Value Date   CHOL 226 (H) 06/17/2017   HDL 42 06/17/2017   LDLCALC 156 (H) 06/17/2017   TRIG 139 06/17/2017   CHOLHDL 5.4 (H) 06/17/2017    --------------------------------------------------------------------------------------------------  ASSESSMENT AND PLAN: Coronary artery disease with stable angina Mr. Tolen continues to have atypical chest pain, which has improved with isosorbide mononitrate.  Unfortunately, he is only using this intermittently due to accompanying headaches.  I have instructed him to begin taking isosorbide mononitrate 15 mg daily.  I have also provided him with a prescription for sublingual nitroglycerin to be used as needed for chest pain.  Of note, prior ischemia evaluations have not shown any obstructive CAD with an atretic LIMA to LAD graft.  Chronic diastolic heart failure and shortness of breath Mr. Mault reports increasing orthopnea and edema over the last 2-1/2 months.  He has trace pedal edema on exam today without JVD.  His lungs are clear.  I have asked him to continue to take furosemide 40 mg on a daily basis.  He should limit his salt intake and can take an additional 40 mg of furosemide if he gains more than 2 pounds in a day or 5 pounds in a week.  I will check a complete metabolic panel today to ensure stable renal function and electrolytes.   I will also repeat an echocardiogram to ensure that he does not have any new structural abnormalities, though I suspect his previously documented grade 2 diastolic dysfunction is the driving force behind his symptoms.  Hyperlipidemia Given history of multiple MI's, Mr. Stockley has a goal LDL less than 70.  We will recheck a lipid panel and plan for continued high intensity statin therapy.  Hypertension Diastolic blood pressure mildly elevated today, given history of diabetes.  Low-sodium diet encouraged.  Continue HCTZ and Abrol, and metoprolol tartrate.  We will add isosorbide mononitrate  15 mg daily today.  If he needs to remain on furosemide indefinitely, it may be worthwhile to discontinue HCTZ.  Follow-up: Return to clinic in 3 months.  Nelva Bush, MD 06/17/2017 8:41 PM

## 2017-06-20 ENCOUNTER — Other Ambulatory Visit: Payer: Self-pay

## 2017-06-20 DIAGNOSIS — E785 Hyperlipidemia, unspecified: Secondary | ICD-10-CM

## 2017-06-20 MED ORDER — ATORVASTATIN CALCIUM 80 MG PO TABS: 80.0000 mg | ORAL_TABLET | Freq: Every day | ORAL | 3 refills | Status: DC

## 2017-06-23 NOTE — Patient Instructions (Signed)
Dalton [name]  06/23/2017     @PREFPERIOPPHARMACY @   Your procedure is scheduled on  07/05/2017 .  Report to [facility] at  615   A.M.  Call this number if you have problems the morning of surgery:  [phone]   Remember:  Do not eat food or drink liquids after midnight.  Take these medicines the morning of surgery with A SIP OF WATER: xanax, zyrtec, dexilant, isosorbide, lisinopril, lyrica, antivert, metoprolol, percocet, zantac, zoloft, flomax. Use your inhaler and your nebulizer before you come.   Do not wear jewelry, make-up or nail polish.  Do not wear lotions, powders, or perfumes, or deodorant.  Do not shave 48 hours prior to surgery.  Men may shave face and neck.  Do not bring valuables to the hospital.  [facility] is not responsible for any belongings or valuables.  Contacts, dentures or bridgework may not be worn into surgery.  Leave your suitcase in the car.  After surgery it may be brought to your room.  For patients admitted to the hospital, discharge time will be determined by your treatment team.  Patients discharged the day of surgery will not be allowed to drive home.   Name and phone number of your driver:   family Special instructions:  Follow the diet and prep instructions given to you from Dr Nona Dell office.  Please read over the following fact sheets that you were given. Anesthesia Post-op Instructions and Care and Recovery After Surgery       Colonoscopy, Adult A colonoscopy is an exam to look at the large intestine. It is done to check for problems, such as:  Lumps (tumors).  Growths (polyps).  Swelling (inflammation).  Bleeding.  What happens before the procedure? Eating and drinking Follow instructions from your doctor about eating and drinking. These instructions may include:  A few days before the procedure - follow a low-fiber diet. ? Avoid nuts. ? Avoid seeds. ? Avoid dried fruit. ? Avoid raw fruits. ? Avoid  vegetables.  1-3 days before the procedure - follow a clear liquid diet. Avoid liquids that have red or purple dye. Drink only clear liquids, such as: ? Clear broth or bouillon. ? Black coffee or tea. ? Clear juice. ? Clear soft drinks or sports drinks. ? Gelatin dessert. ? Popsicles.  On the day of the procedure - do not eat or drink anything during the 2 hours before the procedure.  Bowel prep If you were prescribed an oral bowel prep:  Take it as told by your doctor. Starting the day before your procedure, you will need to drink a lot of liquid. The liquid will cause you to poop (have bowel movements) until your poop is almost clear or light green.  If your skin or butt gets irritated from diarrhea, you may: ? Wipe the area with wipes that have medicine in them, such as adult wet wipes with aloe and vitamin E. ? Put something on your skin that soothes the area, such as petroleum jelly.  If you throw up (vomit) while drinking the bowel prep, take a break for up to 60 minutes. Then begin the bowel prep again. If you keep throwing up and you cannot take the bowel prep without throwing up, call your doctor.  General instructions  Ask your doctor about changing or stopping your normal medicines. This is important if you take diabetes medicines or blood thinners.  Plan to have someone  take you home from the hospital or clinic. What happens during the procedure?  An IV tube may be put into one of your veins.  You will be given medicine to help you relax (sedative).  To reduce your risk of infection: ? Your doctors will wash their hands. ? Your anal area will be washed with soap.  You will be asked to lie on your side with your knees bent.  Your doctor will get a long, thin, flexible tube ready. The tube will have a camera and a light on the end.  The tube will be put into your anus.  The tube will be gently put into your large intestine.  Air will be delivered into your  large intestine to keep it open. You may feel some pressure or cramping.  The camera will be used to take photos.  A small tissue sample may be removed from your body to be looked at under a microscope (biopsy). If any possible problems are found, the tissue will be sent to a lab for testing.  If small growths are found, your doctor may remove them and have them checked for cancer.  The tube that was put into your anus will be slowly removed. The procedure may vary among doctors and hospitals. What happens after the procedure?  Your doctor will check on you often until the medicines you were given have worn off.  Do not drive for 24 hours after the procedure.  You may have a small amount of blood in your poop.  You may pass gas.  You may have mild cramps or bloating in your belly (abdomen).  It is up to you to get the results of your procedure. Ask your doctor, or the department performing the procedure, when your results will be ready. This information is not intended to replace advice given to you by your health care provider. Make sure you discuss any questions you have with your health care provider. Document Released: 03/13/2010 Document Revised: 12/10/2015 Document Reviewed: 04/22/2015 Elsevier Interactive Patient Education  2017 [employer]  Colonoscopy, Adult, Care After This sheet gives you information about how to care for yourself after your procedure. Your health care provider may also give you more specific instructions. If you have problems or questions, contact your health care provider. What can I expect after the procedure? After the procedure, it is common to have:  A small amount of blood in your stool for 24 hours after the procedure.  Some gas.  Mild abdominal cramping or bloating.  Follow these instructions at home: General instructions   For the first 24 hours after the procedure: ? Do not drive or use machinery. ? Do not sign important  documents. ? Do not drink alcohol. ? Do your regular daily activities at a slower pace than normal. ? Eat soft, easy-to-digest foods. ? Rest often.  Take over-the-counter or prescription medicines only as told by your health care provider.  It is up to you to get the results of your procedure. Ask your health care provider, or the department performing the procedure, when your results will be ready. Relieving cramping and bloating  Try walking around when you have cramps or feel bloated.  Apply heat to your abdomen as told by your health care provider. Use a heat source that your health care provider recommends, such as a moist heat pack or a heating pad. ? Place a towel between your skin and the heat source. ? Leave the heat on for  20-30 minutes. ? Remove the heat if your skin turns bright red. This is especially important if you are unable to feel pain, heat, or cold. You may have a greater risk of getting burned. Eating and drinking  Drink enough fluid to keep your urine clear or pale yellow.  Resume your normal diet as instructed by your health care provider. Avoid heavy or fried foods that are hard to digest.  Avoid drinking alcohol for as long as instructed by your health care provider. Contact a health care provider if:  You have blood in your stool 2-3 days after the procedure. Get help right away if:  You have more than a small spotting of blood in your stool.  You pass large blood clots in your stool.  Your abdomen is swollen.  You have nausea or vomiting.  You have a fever.  You have increasing abdominal pain that is not relieved with medicine. This information is not intended to replace advice given to you by your health care provider. Make sure you discuss any questions you have with your health care provider. Document Released: 09/23/2003 Document Revised: 11/03/2015 Document Reviewed: 04/22/2015 Elsevier Interactive Patient Education  2018 [place] Anesthesia is a term that refers to techniques, procedures, and medicines that help a person stay safe and comfortable during a medical procedure. Monitored anesthesia care, or sedation, is one type of anesthesia. Your anesthesia specialist may recommend sedation if you will be having a procedure that does not require you to be unconscious, such as:  Cataract surgery.  A dental procedure.  A biopsy.  A colonoscopy.  During the procedure, you may receive a medicine to help you relax (sedative). There are three levels of sedation:  Mild sedation. At this level, you may feel awake and relaxed. You will be able to follow directions.  Moderate sedation. At this level, you will be sleepy. You may not remember the procedure.  Deep sedation. At this level, you will be asleep. You will not remember the procedure.  The more medicine you are given, the deeper your level of sedation will be. Depending on how you respond to the procedure, the anesthesia specialist may change your level of sedation or the type of anesthesia to fit your needs. An anesthesia specialist will monitor you closely during the procedure. Let your health care provider know about:  Any allergies you have.  All medicines you are taking, including vitamins, herbs, eye drops, creams, and over-the-counter medicines.  Any use of steroids (by mouth or as a cream).  Any problems you or family members have had with sedatives and anesthetic medicines.  Any blood disorders you have.  Any surgeries you have had.  Any medical conditions you have, such as sleep apnea.  Whether you are pregnant or may be pregnant.  Any use of cigarettes, alcohol, or street drugs. What are the risks? Generally, this is a safe procedure. However, problems may occur, including:  Getting too much medicine (oversedation).  Nausea.  Allergic reaction to medicines.  Trouble breathing. If this happens, a breathing  tube may be used to help with breathing. It will be removed when you are awake and breathing on your own.  Heart trouble.  Lung trouble.  Before the procedure Staying hydrated Follow instructions from your health care provider about hydration, which may include:  Up to 2 hours before the procedure - you may continue to drink clear liquids, such as water, clear fruit juice, black coffee, and  plain tea.  Eating and drinking restrictions Follow instructions from your health care provider about eating and drinking, which may include:  8 hours before the procedure - stop eating heavy meals or foods such as meat, fried foods, or fatty foods.  6 hours before the procedure - stop eating light meals or foods, such as toast or cereal.  6 hours before the procedure - stop drinking milk or drinks that contain milk.  2 hours before the procedure - stop drinking clear liquids.  Medicines Ask your health care provider about:  Changing or stopping your regular medicines. This is especially important if you are taking diabetes medicines or blood thinners.  Taking medicines such as aspirin and ibuprofen. These medicines can thin your blood. Do not take these medicines before your procedure if your health care provider instructs you not to.  Tests and exams  You will have a physical exam.  You may have blood tests done to show: ? How well your kidneys and liver are working. ? How well your blood can clot.  General instructions  Plan to have someone take you home from the hospital or clinic.  If you will be going home right after the procedure, plan to have someone with you for 24 hours.  What happens during the procedure?  Your blood pressure, heart rate, breathing, level of pain and overall condition will be monitored.  An IV tube will be inserted into one of your veins.  Your anesthesia specialist will give you medicines as needed to keep you comfortable during the procedure. This  may mean changing the level of sedation.  The procedure will be performed. After the procedure  Your blood pressure, heart rate, breathing rate, and blood oxygen level will be monitored until the medicines you were given have worn off.  Do not drive for 24 hours if you received a sedative.  You may: ? Feel sleepy, clumsy, or nauseous. ? Feel forgetful about what happened after the procedure. ? Have a sore throat if you had a breathing tube during the procedure. ? Vomit. This information is not intended to replace advice given to you by your health care provider. Make sure you discuss any questions you have with your health care provider. Document Released: 11/04/2004 Document Revised: 07/18/2015 Document Reviewed: 06/01/2015 Elsevier Interactive Patient Education  2018 [place], Care After These instructions provide you with information about caring for yourself after your procedure. Your health care provider may also give you more specific instructions. Your treatment has been planned according to current medical practices, but problems sometimes occur. Call your health care provider if you have any problems or questions after your procedure. What can I expect after the procedure? After your procedure, it is common to:  Feel sleepy for several hours.  Feel clumsy and have poor balance for several hours.  Feel forgetful about what happened after the procedure.  Have poor judgment for several hours.  Feel nauseous or vomit.  Have a sore throat if you had a breathing tube during the procedure.  Follow these instructions at home: For at least 24 hours after the procedure:   Do not: ? Participate in activities in which you could fall or become injured. ? Drive. ? Use heavy machinery. ? Drink alcohol. ? Take sleeping pills or medicines that cause drowsiness. ? Make important decisions or sign legal documents. ? Take care of children on your  own.  Rest. Eating and drinking  Follow the diet that  is recommended by your health care provider.  If you vomit, drink water, juice, or soup when you can drink without vomiting.  Make sure you have little or no nausea before eating solid foods. General instructions  Have a responsible adult stay with you until you are awake and alert.  Take over-the-counter and prescription medicines only as told by your health care provider.  If you smoke, do not smoke without supervision.  Keep all follow-up visits as told by your health care provider. This is important. Contact a health care provider if:  You keep feeling nauseous or you keep vomiting.  You feel light-headed.  You develop a rash.  You have a fever. Get help right away if:  You have trouble breathing. This information is not intended to replace advice given to you by your health care provider. Make sure you discuss any questions you have with your health care provider. Document Released: 06/01/2015 Document Revised: 10/01/2015 Document Reviewed: 06/01/2015 Elsevier Interactive Patient Education  [employer].

## 2017-06-28 ENCOUNTER — Encounter (HOSPITAL_COMMUNITY): Payer: Self-pay

## 2017-06-28 ENCOUNTER — Other Ambulatory Visit: Payer: Self-pay

## 2017-06-28 DIAGNOSIS — E785 Hyperlipidemia, unspecified: Secondary | ICD-10-CM | POA: Insufficient documentation

## 2017-06-28 DIAGNOSIS — I5032 Chronic diastolic (congestive) heart failure: Secondary | ICD-10-CM | POA: Insufficient documentation

## 2017-06-28 DIAGNOSIS — I25118 Atherosclerotic heart disease of native coronary artery with other forms of angina pectoris: Secondary | ICD-10-CM | POA: Insufficient documentation

## 2017-06-28 LAB — CBC WITH DIFFERENTIAL/PLATELET
BASOS PCT: 0 %
Basophils Absolute: 0 10*3/uL (ref 0.0–0.1)
EOS ABS: 0.1 10*3/uL (ref 0.0–0.7)
EOS PCT: 3 %
HCT: 36.2 % — ABNORMAL LOW (ref 39.0–52.0)
Hemoglobin: 12.2 g/dL — ABNORMAL LOW (ref 13.0–17.0)
Lymphocytes Relative: 46 %
Lymphs Abs: 1.7 10*3/uL (ref 0.7–4.0)
MCH: 29.3 pg (ref 26.0–34.0)
MCHC: 33.7 g/dL (ref 30.0–36.0)
MCV: 87 fL (ref 78.0–100.0)
MONO ABS: 0.4 10*3/uL (ref 0.1–1.0)
Monocytes Relative: 10 %
Neutro Abs: 1.6 10*3/uL — ABNORMAL LOW (ref 1.7–7.7)
Neutrophils Relative %: 41 %
PLATELETS: 189 10*3/uL (ref 150–400)
RBC: 4.16 MIL/uL — ABNORMAL LOW (ref 4.22–5.81)
RDW: 13.9 % (ref 11.5–15.5)
WBC: 3.8 10*3/uL — AB (ref 4.0–10.5)

## 2017-06-28 LAB — BASIC METABOLIC PANEL
Anion gap: 6 (ref 5–15)
BUN: 8 mg/dL (ref 6–20)
CHLORIDE: 104 mmol/L (ref 101–111)
CO2: 28 mmol/L (ref 22–32)
Calcium: 9 mg/dL (ref 8.9–10.3)
Creatinine, Ser: 0.8 mg/dL (ref 0.61–1.24)
GFR calc Af Amer: >60 mL/min (ref >60)
GFR calc non Af Amer: >60 mL/min (ref >60)
Glucose, Bld: 69 mg/dL (ref 65–99)
Potassium: 3.6 mmol/L (ref 3.5–5.1)
SODIUM: 138 mmol/L (ref 135–145)

## 2017-06-28 NOTE — Progress Notes (Signed)
CC'D TO PCP °

## 2017-06-30 ENCOUNTER — Ambulatory Visit (HOSPITAL_BASED_OUTPATIENT_CLINIC_OR_DEPARTMENT_OTHER): Payer: Medicare Other

## 2017-06-30 ENCOUNTER — Other Ambulatory Visit: Payer: Self-pay

## 2017-06-30 ENCOUNTER — Encounter (HOSPITAL_COMMUNITY)
Admission: RE | Admit: 2017-06-30 | Discharge: 2017-06-30 | Disposition: A | Discharge status: Home / Self Care | Payer: Medicare Other | Source: Ambulatory Visit | Attending: Gastroenterology | Admitting: Gastroenterology

## 2017-06-30 DIAGNOSIS — I25118 Atherosclerotic heart disease of native coronary artery with other forms of angina pectoris: Secondary | ICD-10-CM

## 2017-06-30 DIAGNOSIS — E785 Hyperlipidemia, unspecified: Secondary | ICD-10-CM

## 2017-06-30 DIAGNOSIS — I5032 Chronic diastolic (congestive) heart failure: Secondary | ICD-10-CM | POA: Diagnosis not present

## 2017-07-04 ENCOUNTER — Encounter (HOSPITAL_COMMUNITY): Payer: Self-pay | Admitting: *Deleted

## 2017-07-04 NOTE — Progress Notes (Signed)
Dr Rick Duff aware of Drug history,  No orders given.

## 2017-07-05 ENCOUNTER — Ambulatory Visit (HOSPITAL_COMMUNITY): Payer: Medicare Other | Admitting: Anesthesiology

## 2017-07-05 ENCOUNTER — Encounter (HOSPITAL_COMMUNITY): Payer: Self-pay | Admitting: *Deleted

## 2017-07-05 ENCOUNTER — Ambulatory Visit (HOSPITAL_COMMUNITY)
Admission: RE | Admit: 2017-07-05 | Discharge: 2017-07-05 | Disposition: A | Discharge status: Home / Self Care | Payer: Medicare Other | Source: Ambulatory Visit | Attending: Gastroenterology | Admitting: Gastroenterology

## 2017-07-05 ENCOUNTER — Encounter (HOSPITAL_COMMUNITY)
Admission: RE | Disposition: A | Discharge status: Home / Self Care | Payer: Self-pay | Source: Ambulatory Visit | Attending: Gastroenterology

## 2017-07-05 DIAGNOSIS — K648 Other hemorrhoids: Secondary | ICD-10-CM | POA: Diagnosis not present

## 2017-07-05 DIAGNOSIS — E119 Type 2 diabetes mellitus without complications: Secondary | ICD-10-CM | POA: Insufficient documentation

## 2017-07-05 DIAGNOSIS — R51 Headache: Secondary | ICD-10-CM | POA: Diagnosis not present

## 2017-07-05 DIAGNOSIS — K5900 Constipation, unspecified: Secondary | ICD-10-CM | POA: Diagnosis not present

## 2017-07-05 DIAGNOSIS — K921 Melena: Secondary | ICD-10-CM

## 2017-07-05 DIAGNOSIS — I110 Hypertensive heart disease with heart failure: Secondary | ICD-10-CM | POA: Diagnosis not present

## 2017-07-05 DIAGNOSIS — D126 Benign neoplasm of colon, unspecified: Secondary | ICD-10-CM

## 2017-07-05 DIAGNOSIS — I2510 Atherosclerotic heart disease of native coronary artery without angina pectoris: Secondary | ICD-10-CM | POA: Insufficient documentation

## 2017-07-05 DIAGNOSIS — M069 Rheumatoid arthritis, unspecified: Secondary | ICD-10-CM | POA: Diagnosis not present

## 2017-07-05 DIAGNOSIS — Z79899 Other long term (current) drug therapy: Secondary | ICD-10-CM | POA: Insufficient documentation

## 2017-07-05 DIAGNOSIS — D128 Benign neoplasm of rectum: Secondary | ICD-10-CM | POA: Diagnosis not present

## 2017-07-05 DIAGNOSIS — G709 Myoneural disorder, unspecified: Secondary | ICD-10-CM | POA: Insufficient documentation

## 2017-07-05 DIAGNOSIS — I739 Peripheral vascular disease, unspecified: Secondary | ICD-10-CM | POA: Insufficient documentation

## 2017-07-05 DIAGNOSIS — K644 Residual hemorrhoidal skin tags: Secondary | ICD-10-CM | POA: Insufficient documentation

## 2017-07-05 DIAGNOSIS — I509 Heart failure, unspecified: Secondary | ICD-10-CM | POA: Diagnosis not present

## 2017-07-05 DIAGNOSIS — K6389 Other specified diseases of intestine: Secondary | ICD-10-CM

## 2017-07-05 DIAGNOSIS — D649 Anemia, unspecified: Secondary | ICD-10-CM | POA: Diagnosis not present

## 2017-07-05 DIAGNOSIS — Z86718 Personal history of other venous thrombosis and embolism: Secondary | ICD-10-CM | POA: Insufficient documentation

## 2017-07-05 DIAGNOSIS — F419 Anxiety disorder, unspecified: Secondary | ICD-10-CM | POA: Insufficient documentation

## 2017-07-05 DIAGNOSIS — G40409 Other generalized epilepsy and epileptic syndromes, not intractable, without status epilepticus: Secondary | ICD-10-CM | POA: Insufficient documentation

## 2017-07-05 DIAGNOSIS — Z8249 Family history of ischemic heart disease and other diseases of the circulatory system: Secondary | ICD-10-CM | POA: Insufficient documentation

## 2017-07-05 DIAGNOSIS — K625 Hemorrhage of anus and rectum: Secondary | ICD-10-CM | POA: Diagnosis present

## 2017-07-05 DIAGNOSIS — G8929 Other chronic pain: Secondary | ICD-10-CM | POA: Insufficient documentation

## 2017-07-05 DIAGNOSIS — F319 Bipolar disorder, unspecified: Secondary | ICD-10-CM | POA: Diagnosis not present

## 2017-07-05 DIAGNOSIS — M549 Dorsalgia, unspecified: Secondary | ICD-10-CM | POA: Diagnosis not present

## 2017-07-05 DIAGNOSIS — D120 Benign neoplasm of cecum: Secondary | ICD-10-CM | POA: Diagnosis not present

## 2017-07-05 DIAGNOSIS — K621 Rectal polyp: Secondary | ICD-10-CM | POA: Diagnosis not present

## 2017-07-05 DIAGNOSIS — Z9841 Cataract extraction status, right eye: Secondary | ICD-10-CM | POA: Insufficient documentation

## 2017-07-05 DIAGNOSIS — Z951 Presence of aortocoronary bypass graft: Secondary | ICD-10-CM | POA: Insufficient documentation

## 2017-07-05 DIAGNOSIS — I252 Old myocardial infarction: Secondary | ICD-10-CM | POA: Insufficient documentation

## 2017-07-05 DIAGNOSIS — R350 Frequency of micturition: Secondary | ICD-10-CM | POA: Diagnosis not present

## 2017-07-05 DIAGNOSIS — R0602 Shortness of breath: Secondary | ICD-10-CM | POA: Insufficient documentation

## 2017-07-05 DIAGNOSIS — F1910 Other psychoactive substance abuse, uncomplicated: Secondary | ICD-10-CM | POA: Insufficient documentation

## 2017-07-05 DIAGNOSIS — E7800 Pure hypercholesterolemia, unspecified: Secondary | ICD-10-CM | POA: Insufficient documentation

## 2017-07-05 DIAGNOSIS — K219 Gastro-esophageal reflux disease without esophagitis: Secondary | ICD-10-CM | POA: Diagnosis not present

## 2017-07-05 DIAGNOSIS — Z7951 Long term (current) use of inhaled steroids: Secondary | ICD-10-CM | POA: Insufficient documentation

## 2017-07-05 DIAGNOSIS — Z981 Arthrodesis status: Secondary | ICD-10-CM | POA: Insufficient documentation

## 2017-07-05 DIAGNOSIS — Z7982 Long term (current) use of aspirin: Secondary | ICD-10-CM | POA: Insufficient documentation

## 2017-07-05 DIAGNOSIS — Z7984 Long term (current) use of oral hypoglycemic drugs: Secondary | ICD-10-CM | POA: Insufficient documentation

## 2017-07-05 DIAGNOSIS — Z820 Family history of epilepsy and other diseases of the nervous system: Secondary | ICD-10-CM | POA: Insufficient documentation

## 2017-07-05 DIAGNOSIS — Z833 Family history of diabetes mellitus: Secondary | ICD-10-CM | POA: Insufficient documentation

## 2017-07-05 DIAGNOSIS — Z8711 Personal history of peptic ulcer disease: Secondary | ICD-10-CM | POA: Insufficient documentation

## 2017-07-05 DIAGNOSIS — D175 Benign lipomatous neoplasm of intra-abdominal organs: Secondary | ICD-10-CM

## 2017-07-05 DIAGNOSIS — Z9842 Cataract extraction status, left eye: Secondary | ICD-10-CM | POA: Insufficient documentation

## 2017-07-05 HISTORY — PX: POLYPECTOMY: SHX5525

## 2017-07-05 HISTORY — PX: COLONOSCOPY WITH PROPOFOL: SHX5780

## 2017-07-05 LAB — GLUCOSE, CAPILLARY
GLUCOSE-CAPILLARY: 95 mg/dL (ref 65–99)
Glucose-Capillary: 94 mg/dL (ref 65–99)

## 2017-07-05 SURGERY — COLONOSCOPY WITH PROPOFOL
Anesthesia: Monitor Anesthesia Care

## 2017-07-05 MED ORDER — LIDOCAINE HCL (PF) 1 % IJ SOLN
INTRAMUSCULAR | Status: AC
  Filled 2017-07-05: qty 5

## 2017-07-05 MED ORDER — DEXLANSOPRAZOLE 60 MG PO CPDR: 60.0000 mg | DELAYED_RELEASE_CAPSULE | Freq: Every day | ORAL | 5 refills | Status: AC

## 2017-07-05 MED ORDER — PROPOFOL 10 MG/ML IV BOLUS
INTRAVENOUS | Status: AC
  Filled 2017-07-05: qty 60

## 2017-07-05 MED ORDER — PROPOFOL 10 MG/ML IV BOLUS
INTRAVENOUS | Status: AC
  Filled 2017-07-05: qty 20

## 2017-07-05 MED ORDER — CHLORHEXIDINE GLUCONATE CLOTH 2 % EX PADS: 6.0000 | MEDICATED_PAD | Freq: Once | CUTANEOUS | Status: DC

## 2017-07-05 MED ORDER — PROPOFOL 500 MG/50ML IV EMUL
INTRAVENOUS | Status: DC | PRN
  Administered 2017-07-05: 150 ug/kg/min via INTRAVENOUS
  Administered 2017-07-05 (×2): via INTRAVENOUS

## 2017-07-05 MED ORDER — LACTATED RINGERS IV SOLN
INTRAVENOUS | Status: DC
  Administered 2017-07-05: 1000 mL via INTRAVENOUS

## 2017-07-05 NOTE — Transfer of Care (Signed)
Immediate Anesthesia Transfer of Care Note  Patient: Dalton [name]  Procedure(s) Performed: COLONOSCOPY WITH PROPOFOL (N/A ) POLYPECTOMY  Patient Location: PACU  Anesthesia Type:MAC  Level of Consciousness: awake, alert , oriented and patient cooperative  Airway & Oxygen Therapy: Patient Spontanous Breathing  Post-op Assessment: Report given to RN and Post -op Vital signs reviewed and stable  Post vital signs: Reviewed and stable  Last Vitals:  Vitals Value Taken Time  BP    Temp    Pulse    Resp    SpO2      Last Pain:  Vitals:   07/05/17 0754  TempSrc: Oral  PainSc: 8       Patients Stated Pain Goal: 8 (99/77/41 [id number])  Complications: No apparent anesthesia complications

## 2017-07-05 NOTE — Op Note (Signed)
[facility] Patient Name: Dalton [name] Procedure Date: 07/05/2017 8:49 AM MRN: [id number] Date of Birth: 08-04-1960 Attending MD: Barney Drain MD, MD CSN: [id number] Age: 57 Admit Type: Outpatient Procedure:                Colonoscopy-WITH COLD SNARE/SNARE CAUTERY                            POLYPECTOMY & COLD FORCEPS BIOPSY Indications:              Hematochezia-OILY FILM ON SCOPE Providers:                Barney Drain MD, MD, Otis Peak B. Sharon Seller, RN, Aram Candela Referring MD:             Tarry Kos Avbuere MD Medicines:                Propofol per Anesthesia Complications:            No immediate complications. Estimated Blood Loss:     Estimated blood loss was minimal. Procedure:                Pre-Anesthesia Assessment:                           - Prior to the procedure, a History and Physical                            was performed, and patient medications and                            allergies were reviewed. The patient's tolerance of                            previous anesthesia was also reviewed. The risks                            and benefits of the procedure and the sedation                            options and risks were discussed with the patient.                            All questions were answered, and informed consent                            was obtained. Prior Anticoagulants: The patient has                            taken aspirin, last dose was 2 days prior to                            procedure. ASA Grade Assessment: II - A patient  with mild systemic disease. After reviewing the                            risks and benefits, the patient was deemed in                            satisfactory condition to undergo the procedure.                            After obtaining informed consent, the colonoscope                            was passed under direct vision. Throughout the         procedure, the patient's blood pressure, pulse, and                            oxygen saturations were monitored continuously. The                            EC-[id number] ([id number]) scope was introduced through                            the anus and advanced to the 3 cm into the ileum.                            The colonoscopy was technically difficult and                            complex due to poor endoscopic visualization and a                            tortuous colon. Successful completion of the                            procedure was aided by changing the patient to a                            supine position, using manual pressure,                            straightening and shortening the scope to obtain                            bowel loop reduction and COLOWRAP. The patient                            tolerated the procedure well. The quality of the                            bowel preparation was adequate to identify polyps 6                            mm and larger in size.  The terminal ileum,                            ileocecal valve, appendiceal orifice, and rectum                            were photographed. Scope In: 9:20:01 AM Scope Out: 9:44:09 AM Scope Withdrawal Time: 0 hours 20 minutes 54 seconds  Total Procedure Duration: 0 hours 24 minutes 8 seconds  Findings:      The terminal ileum appeared normal.      A 8 mm polyp was found in the cecum. The polyp was sessile. The polyp       was removed with a hot snare. Resection and retrieval were complete.      A 4 mm polyp was found in the rectum. The polyp was sessile. The polyp       was removed with a cold snare. Resection and retrieval were complete.      One medium submucosal nodule was found at the hepatic flexure. This was       biopsied with a cold forceps for histology.      A few small and large-mouthed diverticula were found in the       recto-sigmoid colon and sigmoid colon.      External and  internal hemorrhoids were found during retroflexion. The       hemorrhoids were large. Impression:               - The examined portion of the ileum was normal.                           - One 8 mm polyp in the cecum, removed with a hot                            snare. Resected and retrieved.                           - One 4 mm polyp in the rectum, removed with a cold                            snare. Resected and retrieved.                           - Submucosal nodule at the hepatic flexure.                            Biopsied.                           - Diverticulosis in the recto-sigmoid colon and in                            the sigmoid colon.                           - RECTAL BLEEDING DUE TO LARGE internal hemorrhoids. Moderate Sedation:      Per Anesthesia Care Recommendation:           -  Repeat colonoscopy date to be determined after                            pending pathology results are reviewed for                            surveillance: 1 YEAR IF ADVANCED POLYP AND 3 YEARS                            IF SIMPLE ADENOMS DUE TO DECREASED                            VISULAIZATION(OILY FILM ON SCOPE).                           - High fiber diet.                           - Continue present medications.                           - Await pathology results.                           - Return to my office in 6 months.                           - Patient has a contact number available for                            emergencies. The signs and symptoms of potential                            delayed complications were discussed with the                            patient. Return to normal activities tomorrow.                            Written discharge instructions were provided to the                            patient. Procedure Code(s):        --- Professional ---                           [id number], Colonoscopy, flexible; with removal of                            tumor(s),  polyp(s), or other lesion(s) by snare                            technique                           26834, 32, Colonoscopy, flexible; with biopsy,  single or multiple Diagnosis Code(s):        --- Professional ---                           K64.8, Other hemorrhoids                           D12.0, Benign neoplasm of cecum                           K62.1, Rectal polyp                           K63.89, Other specified diseases of intestine                           K92.1, Melena (includes Hematochezia)                           K57.30, Diverticulosis of large intestine without                            perforation or abscess without bleeding CPT copyright 2017 American Medical Association. All rights reserved. The codes documented in this report are preliminary and upon coder review may  be revised to meet current compliance requirements. Barney Drain, MD Barney Drain MD, MD 07/05/2017 9:59:08 AM This report has been signed electronically. Number of Addenda: 0

## 2017-07-05 NOTE — Anesthesia Preprocedure Evaluation (Signed)
Anesthesia Evaluation  Patient identified by MRN, date of birth, ID band Patient awake    Reviewed: Allergy & Precautions, H&P , NPO status , Patient's Chart, lab work & pertinent test results, reviewed documented beta blocker date and time   Airway Mallampati: II  TM Distance: >3 FB Neck ROM: full    Dental no notable dental hx.    Pulmonary neg pulmonary ROS, shortness of breath,    Pulmonary exam normal breath sounds clear to auscultation       Cardiovascular Exercise Tolerance: Good hypertension, + CAD, + Past MI, + CABG, + Peripheral Vascular Disease and +CHF  negative cardio ROS   Rhythm:regular Rate:Normal     Neuro/Psych  Headaches, Seizures -, Well Controlled,  PSYCHIATRIC DISORDERS Anxiety Depression Bipolar Disorder  Neuromuscular disease negative neurological ROS  negative psych ROS   GI/Hepatic negative GI ROS, Neg liver ROS, PUD, GERD  ,(+)     substance abuse  cocaine use,   Endo/Other  negative endocrine ROSdiabetes  Renal/GU negative Renal ROS  negative genitourinary   Musculoskeletal   Abdominal   Peds  Hematology negative hematology ROS (+) anemia ,   Anesthesia Other Findings   Reproductive/Obstetrics negative OB ROS                             Anesthesia Physical Anesthesia Plan  ASA: III  Anesthesia Plan: MAC   Post-op Pain Management:    Induction:   PONV Risk Score and Plan:   Airway Management Planned:   Additional Equipment:   Intra-op Plan:   Post-operative Plan:   Informed Consent: I have reviewed the patients History and Physical, chart, labs and discussed the procedure including the risks, benefits and alternatives for the proposed anesthesia with the patient or authorized representative who has indicated his/her understanding and acceptance.   Dental Advisory Given  Plan Discussed with: CRNA  Anesthesia Plan Comments:          Anesthesia Quick Evaluation

## 2017-07-05 NOTE — Addendum Note (Signed)
Addendum  created 07/05/17 1321 by Mickel Baas, CRNA   Charge Capture section accepted

## 2017-07-05 NOTE — Anesthesia Postprocedure Evaluation (Signed)
Anesthesia Post Note  Patient: Dalton [name]  Procedure(s) Performed: COLONOSCOPY WITH PROPOFOL (N/A ) POLYPECTOMY     Anesthesia Post Evaluation  Last Vitals:  Vitals:   07/05/17 0845 07/05/17 0900  BP:    Resp: 17 15  Temp:    SpO2: 95% 97%    Last Pain:  Vitals:   07/05/17 0754  TempSrc: Oral  PainSc: 8    Pain Goal: Patients Stated Pain Goal: 8 (07/05/17 0754)               ADAMS, AMY A

## 2017-07-05 NOTE — Anesthesia Procedure Notes (Signed)
Procedure Name: MAC Performed by: Adams, Amy A, CRNA Pre-anesthesia Checklist: Patient identified, Emergency Drugs available, Suction available, Patient being monitored and Timeout performed Oxygen Delivery Method: Nasal cannula       

## 2017-07-05 NOTE — H&P (Addendum)
Primary Care Physician:  Nolene Ebbs, MD Primary Gastroenterologist:  Dr. Oneida Alar  Pre-Procedure History & Physical: HPI:  Dalton [name] is a 57 y.o. male here for  BRBPR.  Past Medical History:  Diagnosis Date  . Anemia   . Anxiety   . Bipolar disorder ([place])   . CHF (congestive heart failure) ([place])   . Chronic back pain    Pain Clinic in [place]  . Chronic bronchitis   . Chronic lower back pain   . Congestive heart failure (CHF) ([place])   . Coronary artery disease   . Coughing   . Depression   . DVT (deep venous thrombosis) ([place]) ~ 2005   LLE  . Frequency of urination   . GERD (gastroesophageal reflux disease)   . 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 TKWIOXBD(532.9)    "a few times/week" (12/24/2015)  . High cholesterol   . HNP (herniated nucleus pulposus), cervical   . Hypertension   . Laceration of right hand 11/27/2010  . Laceration of wrist 2007 BIL FOREARMS  . MI (myocardial infarction) ([place])    7, last one was in 2011 (12/24/2015)  . Neuropathy   . NSAID-induced gastric ulcer    "Ibuprofen"  . PUD (peptic ulcer disease)    in 1990s, secondary to medication  . Rheumatoid arthritis ([place])    "all over" (12/24/2015)  . Shortness of breath    with exertion  . Tonsillitis, chronic    Dr. Vicki Mallet in [place]  . Type II diabetes mellitus ([place])   . Wears glasses     Past Surgical History:  Procedure Laterality Date  . ANTERIOR CERVICAL DECOMP/DISCECTOMY FUSION N/A 11/05/2016   Procedure: Anterior Cervical Discectomy and Fusion - Cervical three-Cervical four;  Surgeon: Earnie Larsson, MD;  Location: [place];  Service: Neurosurgery;  Laterality: N/A;  . BACK SURGERY     x3  . BIOPSY N/A 05/30/2012   Procedure: BIOPSY;  Surgeon: Danie Binder, MD;  Location: [facility];  Service: Endoscopy;  Laterality: N/A;  . BIOPSY  03/02/2016   Procedure: BIOPSY;  Surgeon: Danie Binder, MD;  [facility]  [facility];  Service: Endoscopy;;  gastric  . CARDIAC CATHETERIZATION  "several"  . CARPAL TUNNEL RELEASE Bilateral   . CATARACT EXTRACTION W/PHACO Right 06/15/2016   Procedure: CATARACT EXTRACTION PHACO AND INTRAOCULAR LENS PLACEMENT (IOC);  Surgeon: Rutherford Guys, MD;  Location: [facility];  Service: Ophthalmology;  Laterality: Right;  CDE: 4.64  . CATARACT EXTRACTION W/PHACO Left 07/13/2016   Procedure: CATARACT EXTRACTION PHACO AND INTRAOCULAR LENS PLACEMENT (IOC);  Surgeon: Rutherford Guys, MD;  Location: [facility];  Service: Ophthalmology;  Laterality: Left;  CDE: 3.15  . COLONOSCOPY  12/28/2010   SLF: (MAC)Internal hemorrhoids/four small colon polyps tubular adenomas. per SLF: colonoscopy 2022  . CORONARY ARTERY BYPASS GRAFT  2002   3 vessels  . ESOPHAGOGASTRODUODENOSCOPY N/A 05/30/2012   SLF: UNCONTROLLED GERD DUE TO LIFESTYLE CHOICE/WEIGHT GAIN/MILD Non-erosive gastritis  . ESOPHAGOGASTRODUODENOSCOPY (EGD) WITH PROPOFOL N/A 03/02/2016   Dr. Oneida Alar: Esophagus appeared normal, impaired dilation performed, patchy inflammation with edema and erythema of the entire stomach., Biopsy with H pylori, patient completed Pylera.   [name] Kitchen FRACTURE SURGERY    . LEFT HEART CATHETERIZATION WITH CORONARY ANGIOGRAM N/A 08/31/2011   Procedure: LEFT HEART CATHETERIZATION WITH CORONARY ANGIOGRAM;  Surgeon: Laverda Page, MD;  Location[facility] CATH LAB;  Service: Cardiovascular;  Laterality: N/A;  . LUMBAR  DISC SURGERY     "L4-5; Dr. Trenton Gammon"  . MULTIPLE TOOTH EXTRACTIONS    . ORIF MANDIBULAR FRACTURE N/A 12/19/2015   Procedure: OPEN REDUCTION INTERNAL FIXATION (ORIF) MANDIBULAR FRACTURE;  Surgeon: Izora Gala, MD;  Location: [place];  Service: ENT;  Laterality: N/A;  . PATELLA FRACTURE SURGERY Left 1976   plate to knee cap from accident  . SAVORY DILATION  12/28/2010   SLF:(MAC)J-shaped stomach/nodular mocosa in the distal esophagus/empiric dilation 67m  . SAVORY DILATION N/A 03/02/2016   Procedure: SAVORY DILATION;  Surgeon:  SDanie Binder MD;  [facility];  Service: Endoscopy;  Laterality: N/A;    Prior to Admission medications   Medication Sig Start Date End Date Taking? Authorizing Provider  albuterol (PROVENTIL HFA;VENTOLIN HFA) 108 (90 Base) MCG/ACT inhaler Inhale 2 puffs into the lungs every 6 (six) hours as needed for wheezing or shortness of breath.    Yes [provider]  alprazolam (Duanne Moron 2 MG tablet Take 2 mg by mouth 2 (two) times daily.   Yes [provider]  aspirin EC 81 MG tablet Take 81 mg by mouth at bedtime.   Yes [provider]  atorvastatin (LIPITOR) 80 MG tablet Take 1 tablet (80 mg total) by mouth daily. 06/20/17 09/18/17 Yes End, CHarrell Gave MD  beclomethasone (QVAR) 80 MCG/ACT inhaler Inhale 2 puffs into the lungs 2 (two) times daily.   Yes [provider]  cetirizine (ZYRTEC) 10 MG tablet Take 10 mg by mouth daily as needed for allergies.  01/26/16  Yes [provider]  dexlansoprazole (DEXILANT) 60 MG capsule Take 1 capsule (60 mg total) by mouth daily. 06/04/16  Yes LMahala Menghini PA-C  fluticasone (FLONASE) 50 MCG/ACT nasal spray Place 2 sprays into both nostrils daily as needed for allergies.    Yes [provider]  furosemide (LASIX) 40 MG tablet Take 1 tablet (40 mg total) by mouth daily. 06/17/17 09/15/17 Yes End, CHarrell Gave MD  glipiZIDE (GLUCOTROL) 10 MG tablet Take 10 mg by mouth daily before breakfast.   Yes [provider]  hydrochlorothiazide (HYDRODIURIL) 25 MG tablet Take 1 tablet (25 mg total) by mouth daily. 01/07/16  Yes AKerrie Buffalo NP  ipratropium (ATROVENT) 0.02 % nebulizer solution Take 0.5 mg by nebulization 2 (two) times daily as needed (for congestion).   Yes [provider]  isosorbide mononitrate (IMDUR) 30 MG 24 hr tablet Take 1/2 tablet by mouth daily 06/17/17  Yes End, CHarrell Gave MD  lisinopril (PRINIVIL,ZESTRIL) 10 MG tablet Take 10 mg by mouth daily.   Yes [provider]  LYRICA 100 MG capsule Take 100 mg by mouth 2 (two) times daily as needed (pain).  01/05/16  Yes [provider]  meclizine (ANTIVERT) 25 MG tablet Take 25 mg by mouth 3 (three) times daily as needed for dizziness.   Yes [provider]  metFORMIN (GLUCOPHAGE) 500 MG tablet Take 1 tablet (500 mg total) by mouth daily with breakfast. Patient taking differently: Take 500 mg by mouth 2 (two) times daily with a meal.  01/07/16  Yes AKerrie Buffalo NP  metoprolol tartrate (LOPRESSOR) 25 MG tablet Take 0.5 tablets (12.5 mg total) by mouth 2 (two) times daily. 01/06/16  Yes AKerrie Buffalo NP  NARCAN 4 MG/0.1ML LIQD nasal spray kit Place 1 spray into the nose once.  06/15/17  Yes [provider]  nitroGLYCERIN (NITROSTAT) 0.4 MG SL tablet Place 1 tablet (0.4 mg total) under the tongue every 5 (five) minutes as needed  for chest pain. 06/17/17 09/15/17 Yes End, Harrell Gave, MD  oxyCODONE-acetaminophen (PERCOCET) 10-325 MG tablet Take 1 tablet by mouth every 4 (four) hours as needed for pain.   Yes [provider]  potassium chloride (K-DUR,KLOR-CON) 10 MEQ tablet Take 1 tablet (10 mEq total) by mouth daily. 01/07/16  Yes Kerrie Buffalo, NP  ranitidine (ZANTAC) 300 MG tablet take 1 tablet by mouth at bedtime 10/26/16  Yes Mahala Menghini, PA-C  sertraline (ZOLOFT) 100 MG tablet Take 100 mg by mouth daily.   Yes [provider]  sitaGLIPtin (JANUVIA) 100 MG tablet Take 100 mg by mouth daily.   Yes [provider]  tamsulosin (FLOMAX) 0.4 MG CAPS capsule Take 1 capsule (0.4 mg total) by mouth daily after breakfast. 01/07/16  Yes Kerrie Buffalo, NP    Allergies as of 05/12/2017 - Review Complete 05/12/2017  Allergen Reaction Noted  . Gabapentin Hives   . Ibuprofen Other (See Comments)   . Zolpidem tartrate Other (See Comments)   . Naproxen Other (See Comments)     Family History  Problem Relation Age of Onset  . Diabetes Mother   .  Hypertension Mother   . Heart attack Mother 78  . Hypertension Father   . Diabetes Father   . Heart attack Father 81  . Heart attack Unknown        mother, father, brother, sister all deceased due to MI  . Heart attack Sister   . Heart attack Brother   . Seizures Brother   . Heart failure Other   . Colon cancer Neg Hx   . Liver disease Neg Hx   . Anesthesia problems Neg Hx   . Hypotension Neg Hx   . Malignant hyperthermia Neg Hx   . Pseudochol deficiency Neg Hx   . Colon polyps Neg Hx     Social History   Socioeconomic History  . Marital status: Single    Spouse name: Not on file  . Number of children: 2  . Years of education: Not on file  . Highest education level: Not on file  Occupational History  . Occupation: disabled    Fish farm manager: NOT EMPLOYED  Social Needs  . Financial resource strain: Not on file  . Food insecurity:    Worry: Not on file    Inability: Not on file  . Transportation needs:    Medical: Not on file    Non-medical: Not on file  Tobacco Use  . Smoking status: Never Smoker  . Smokeless tobacco: Never Used  Substance and Sexual Activity  . Alcohol use: No    Alcohol/week: 0.0 oz  . 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 Sexual activity: Not Currently  Lifestyle  . Physical activity:    Days per week: Not on file    Minutes per session: Not on file  . Stress: Not on file  Relationships  . Social connections:    Talks on phone: Not on file    Gets together: Not on file    Attends religious service: Not on file    Active member of club or organization: Not on file    Attends meetings of clubs or organizations: Not on file    Relationship status: Not on file  . Intimate partner violence:    Fear of current or ex partner: Not on file    Emotionally abused: Not on file  Physically abused: Not on file    Forced sexual activity: Not on file  Other Topics Concern  . Not on  file  Social History Narrative   Lives w/ son-23/22    Review of Systems: See HPI, otherwise negative ROS   Physical Exam: There were no vitals taken for this visit. General:   Alert,  pleasant and cooperative in NAD Head:  Normocephalic and atraumatic. Neck:  Supple; Lungs:  Clear throughout to auscultation.    Heart:  Regular rate and rhythm. Abdomen:  Soft, nontender and nondistended. Normal bowel sounds, without guarding, and without rebound.   Neurologic:  Alert and  oriented x4;  grossly normal neurologically.  Impression/Plan:    BRBPR  PLAN: TCS TODAY. DISCUSSED PROCEDURE, BENEFITS, & RISKS: < 1% chance of medication reaction, bleeding, perforation, or rupture of spleen/liver.

## 2017-07-05 NOTE — Discharge Instructions (Signed)
YouR rectal BLEEDING IS DUE TO internal hemorrhoids. YOU HAVE diverticulosis IN YOUR LEFT COLON. YOU HAD TWO POLYPS REMOVED.    DRINK WATER TO KEEP YOUR URINE LIGHT YELLOW.  CONTINUE YOUR WEIGHT LOSS EFFORTS. YOUR BODY MASS INDEX IS OVER 30 WHICH MEANS YOU ARE OBESE. OBESITY IS ASSOCIATED WITH AN INCREASD RISK FOR CIRRHOSIS AND ALL CANCERS, INCLUDING ESOPHAGEAL AND COLON CANCER.  FOLLOW A HIGH FIBER DIET. AVOID ITEMS THAT CAUSE BLOATING. See info below.  USE PREPARATION H FOUR TIMES  A DAY IF NEEDED TO RELIEVE RECTAL PAIN/PRESSURE/BLEEDING.    YOUR BIOPSY RESULTS WILL BE AVAILABLE IN 7 DAYS.   FOLLOW UP IN 6 MOS.   Next colonoscopy in 1-3 years BECAUSE YOU HAVE A HISTORY OF POLYPS AND YOU HAD AN OILY FILM ON THE SCOPE AND SMALL POLYPS COULD HAVE BEEN MISSED.  Colonoscopy Care After Read the instructions outlined below and refer to this sheet in the next week. These discharge instructions provide you with general information on caring for yourself after you leave the hospital. While your treatment has been planned according to the most current medical practices available, unavoidable complications occasionally occur. If you have any problems or questions after discharge, call DR. Deklyn Trachtenberg, [phone].  ACTIVITY  You may resume your regular activity, but move at a slower pace for the next 24 hours.   Take frequent rest periods for the next 24 hours.   Walking will help get rid of the air and reduce the bloated feeling in your belly (abdomen).   No driving for 24 hours (because of the medicine (anesthesia) used during the test).   You may shower.   Do not sign any important legal documents or operate any machinery for 24 hours (because of the anesthesia used during the test).    NUTRITION  Drink plenty of fluids.   You may resume your normal diet as instructed by your doctor.   Begin with a light meal and progress to your normal diet. Heavy or fried foods are harder to digest  and may make you feel sick to your stomach (nauseated).   Avoid alcoholic beverages for 24 hours or as instructed.    MEDICATIONS  You may resume your normal medications.   WHAT YOU CAN EXPECT TODAY  Some feelings of bloating in the abdomen.   Passage of more gas than usual.   Spotting of blood in your stool or on the toilet paper  .  IF YOU HAD POLYPS REMOVED DURING THE COLONOSCOPY:  Eat a soft diet IF YOU HAVE NAUSEA, BLOATING, ABDOMINAL PAIN, OR VOMITING.    FINDING OUT THE RESULTS OF YOUR TEST Not all test results are available during your visit. DR. Oneida Alar WILL CALL YOU WITHIN 14 DAYS OF YOUR PROCEDUE WITH YOUR RESULTS. Do not assume everything is normal if you have not heard from DR. Raihan Kimmel, CALL HER OFFICE AT [phone].  SEEK IMMEDIATE MEDICAL ATTENTION AND CALL THE OFFICE: [phone] IF:  You have more than a spotting of blood in your stool.   Your belly is swollen (abdominal distention).   You are nauseated or vomiting.   You have a temperature over 101F.   You have abdominal pain or discomfort that is severe or gets worse throughout the day.  High-Fiber Diet A high-fiber diet changes your normal diet to include more whole grains, legumes, fruits, and vegetables. Changes in the diet involve replacing refined carbohydrates with unrefined foods. The calorie level of the diet is essentially unchanged. The Dietary Reference Intake (  recommended amount) for adult males is 38 grams per day. For adult females, it is 25 grams per day. Pregnant and lactating women should consume 28 grams of fiber per day. Fiber is the intact part of a plant that is not broken down during digestion. Functional fiber is fiber that has been isolated from the plant to provide a beneficial effect in the body. PURPOSE  Increase stool bulk.   Ease and regulate bowel movements.   Lower cholesterol.   REDUCE RISK OF COLON CANCER  INDICATIONS THAT YOU NEED MORE FIBER  Constipation and  hemorrhoids.   Uncomplicated diverticulosis (intestine condition) and irritable bowel syndrome.   Weight management.   As a protective measure against hardening of the arteries (atherosclerosis), diabetes, and cancer.   GUIDELINES FOR INCREASING FIBER IN THE DIET  Start adding fiber to the diet slowly. A gradual increase of about 5 more grams (2 slices of whole-wheat bread, 2 servings of most fruits or vegetables, or 1 bowl of high-fiber cereal) per day is best. Too rapid an increase in fiber may result in constipation, flatulence, and bloating.   Drink enough water and fluids to keep your urine clear or pale yellow. Water, juice, or caffeine-free drinks are recommended. Not drinking enough fluid may cause constipation.   Eat a variety of high-fiber foods rather than one type of fiber.   Try to increase your intake of fiber through using high-fiber foods rather than fiber pills or supplements that contain small amounts of fiber.   The goal is to change the types of food eaten. Do not supplement your present diet with high-fiber foods, but replace foods in your present diet.   INCLUDE A VARIETY OF FIBER SOURCES  Replace refined and processed grains with whole grains, canned fruits with fresh fruits, and incorporate other fiber sources. White rice, white breads, and most bakery goods contain little or no fiber.   Brown whole-grain rice, buckwheat oats, and many fruits and vegetables are all good sources of fiber. These include: broccoli, Brussels sprouts, cabbage, cauliflower, beets, sweet potatoes, white potatoes (skin on), carrots, tomatoes, eggplant, squash, berries, fresh fruits, and dried fruits.   Cereals appear to be the richest source of fiber. Cereal fiber is found in whole grains and bran. Bran is the fiber-rich outer coat of cereal grain, which is largely removed in refining. In whole-grain cereals, the bran remains. In breakfast cereals, the largest amount of fiber is found in  those with "bran" in their names. The fiber content is sometimes indicated on the label.   You may need to include additional fruits and vegetables each day.   In baking, for 1 cup white flour, you may use the following substitutions:   1 cup whole-wheat flour minus 2 tablespoons.   1/2 cup white flour plus 1/2 cup whole-wheat flour.   Polyps, Colon  A polyp is extra tissue that grows inside your body. Colon polyps grow in the large intestine. The large intestine, also called the colon, is part of your digestive system. It is a long, hollow tube at the end of your digestive tract where your body makes and stores stool. Most polyps are not dangerous. They are benign. This means they are not cancerous. But over time, some types of polyps can turn into cancer. Polyps that are smaller than a pea are usually not harmful. But larger polyps could someday become or may already be cancerous. To be safe, doctors remove all polyps and test them.   PREVENTION There is  not one sure way to prevent polyps. You might be able to lower your risk of getting them if you:  Eat more fruits and vegetables and less fatty food.   Do not smoke.   Avoid alcohol.   Exercise every day.   Lose weight if you are overweight.   Eating more calcium and folate can also lower your risk of getting polyps. Some foods that are rich in calcium are milk, cheese, and broccoli. Some foods that are rich in folate are chickpeas, kidney beans, and spinach.    Diverticulosis Diverticulosis is a common condition that develops when small pouches (diverticula) form in the wall of the colon. The risk of diverticulosis increases with age. It happens more often in people who eat a low-fiber diet. Most individuals with diverticulosis have no symptoms. Those individuals with symptoms usually experience belly (abdominal) pain, constipation, or loose stools (diarrhea).  HOME CARE INSTRUCTIONS  Increase the amount of fiber in your diet as  directed by your caregiver or dietician. This may reduce symptoms of diverticulosis.   Drink at least 6 to 8 glasses of water each day to prevent constipation.   Try not to strain when you have a bowel movement.   Avoiding nuts and seeds to prevent complications is NOT NECESSARY.   FOODS HAVING HIGH FIBER CONTENT INCLUDE:  Fruits. Apple, peach, pear, tangerine, raisins, prunes.   Vegetables. Brussels sprouts, asparagus, broccoli, cabbage, carrot, cauliflower, romaine lettuce, spinach, summer squash, tomato, winter squash, zucchini.   Starchy Vegetables. Baked beans, kidney beans, lima beans, split peas, lentils, potatoes (with skin).   Grains. Whole wheat bread, brown rice, bran flake cereal, plain oatmeal, white rice, shredded wheat, bran muffins.   SEEK IMMEDIATE MEDICAL CARE IF:  You develop increasing pain or severe bloating.   You have an oral temperature above 101F.   You develop vomiting or bowel movements that are bloody or black.   Hemorrhoids Hemorrhoids are dilated (enlarged) veins around the rectum. Sometimes clots will form in the veins. This makes them swollen and painful. These are called thrombosed hemorrhoids. Causes of hemorrhoids include:  Constipation.   Straining to have a bowel movement.   HEAVY LIFTING   HOME CARE INSTRUCTIONS  Eat a well balanced diet and drink 6 to 8 glasses of water every day to avoid constipation. You may also use a bulk laxative.   Avoid straining to have bowel movements.   Keep anal area dry and clean.   Do not use a donut shaped pillow or sit on the toilet for long periods. This increases blood pooling and pain.   Move your bowels when your body has the urge; this will require less straining and will decrease pain and pressure.

## 2017-07-05 NOTE — Addendum Note (Signed)
Addendum  created 07/05/17 1332 by Adams, Amy A, CRNA   Charge Capture section accepted    

## 2017-07-08 ENCOUNTER — Encounter (HOSPITAL_COMMUNITY): Payer: Self-pay | Admitting: Gastroenterology

## 2017-07-13 ENCOUNTER — Telehealth: Payer: Self-pay | Admitting: Internal Medicine

## 2017-07-13 NOTE — Telephone Encounter (Signed)
New Message    Pt c/o medication issue:  1. Name of Medication: Isosorbide  2. How are you currently taking this medication (dosage and times per day)?   3. Are you having a reaction (difficulty breathing--STAT)?    4. What is your medication issue? Patient needs to know when he started medication. Please call.

## 2017-07-13 NOTE — Telephone Encounter (Signed)
Spoke to patient in regards to his Isosorbide Mononitrate (Imdur).  He was inquiring about when he was started on this medication and I informed him that it was 4/26.  He was extremely thankful.

## 2017-07-14 NOTE — Progress Notes (Signed)
LMOM to call.

## 2017-07-14 NOTE — Progress Notes (Signed)
PT is aware. Dalton [name] he was planning to call anyway today. He has been having rectal pain almost constant since his colonoscopy. He has not had a BM since the procedure on 07/05/2017.  Some lower abdominal pain, but more rectal pain. No rectal bleeding, but he said he saw one little spot on tissue once time.  Please advise!

## 2017-07-15 NOTE — Progress Notes (Signed)
PT is aware.

## 2017-07-19 ENCOUNTER — Telehealth: Payer: Self-pay | Admitting: Gastroenterology

## 2017-07-19 NOTE — Telephone Encounter (Signed)
Pt said someone called him and doesn't know who it was. DS, did you try calling this patient? [phone]

## 2017-07-19 NOTE — Telephone Encounter (Signed)
Pt just saw old message where I had called, but he remembers me talking to him now and he doesn't have any questions.

## 2017-08-17 ENCOUNTER — Encounter: Payer: Self-pay | Admitting: Gastroenterology

## 2017-08-17 ENCOUNTER — Ambulatory Visit: Payer: Medicare Other | Admitting: Nurse Practitioner

## 2017-08-17 ENCOUNTER — Telehealth: Payer: Self-pay | Admitting: Gastroenterology

## 2017-08-17 NOTE — Telephone Encounter (Signed)
Patient was a no show and letter sent  °

## 2017-08-17 NOTE — Progress Notes (Deleted)
Referring Provider: Nolene Ebbs, MD Primary Care Physician:  Nolene Ebbs, MD Primary GI:  Dr. Oneida Alar  No chief complaint on file.   HPI:   Dalton [name] is a 57 y.o. male who presents for follow-up of constipation rectal bleeding.  Patient was last seen in our office 05/12/2017 for the same as well as history of colon adenomas.  History of constipation, H. pylori gastritis.  Previous colonoscopy 2012 and recommended 10-year repeat in 2020 with history of tubular adenoma and hyperplastic polyps.  His last visit he is doing okay, had a couple episodes of rectal bleeding with some noted constipation, uses MiraLAX.  But he is currently out of MiraLAX.  He does not have constipation when he takes it every other day.  Bleeding is associated with constipation at times and at other times when he is not dissipated.  Does have some lower abdominal pain.  No other GI symptoms.  Recommended colonoscopy, restart MiraLAX, follow-up in 3 months.  Colonoscopy was completed 07/05/2017 which found a single 8 mm polyp in the cecum, a single 4 mm polyp in the rectum, submucosal nodule at the hepatic flexure status post biopsy, diverticulosis in the rectosigmoid and sigmoid colon, rectal bleeding due to large internal hemorrhoids.  Surgical pathology found the polyps to be either adenoma and the submucosal nodule biopsy to be insistent with a benign lipoma.  Recommended follow-up in 6 months, repeat colonoscopy in 3 years (2022)..  Today he states   Past Medical History:  Diagnosis Date  . Anemia   . Anxiety   . Bipolar disorder ([place])   . CHF (congestive heart failure) ([place])   . Chronic back pain    Pain Clinic in [place]  . Chronic bronchitis   . Chronic lower back pain   . Congestive heart failure (CHF) ([place])   . Coronary artery disease   . Coughing   . Depression   . DVT (deep venous thrombosis) ([place]) ~ 2005   LLE  . Frequency of urination   . GERD (gastroesophageal reflux disease)   .  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 FYBOFBPZ(025.8)    "a few times/week" (12/24/2015)  . High cholesterol   . HNP (herniated nucleus pulposus), cervical   . Hypertension   . Laceration of right hand 11/27/2010  . Laceration of wrist 2007 BIL FOREARMS  . MI (myocardial infarction) ([place])    7, last one was in 2011 (12/24/2015)  . Neuropathy   . NSAID-induced gastric ulcer    "Ibuprofen"  . PUD (peptic ulcer disease)    in 1990s, secondary to medication  . Rheumatoid arthritis ([place])    "all over" (12/24/2015)  . Shortness of breath    with exertion  . Tonsillitis, chronic    Dr. Vicki Mallet in [place]  . Type II diabetes mellitus ([place])   . Wears glasses     Past Surgical History:  Procedure Laterality Date  . ANTERIOR CERVICAL DECOMP/DISCECTOMY FUSION N/A 11/05/2016   Procedure: Anterior Cervical Discectomy and Fusion - Cervical three-Cervical four;  Surgeon: Earnie Larsson, MD;  Location: [facility];  Service: Neurosurgery;  Laterality: N/A;  . BACK SURGERY     x3  . BIOPSY N/A 05/30/2012   Procedure: BIOPSY;  Surgeon: Danie Binder, MD;  Location: [facility];  Service: Endoscopy;  Laterality: N/A;  . BIOPSY  03/02/2016   Procedure: BIOPSY;  Surgeon: Danie Binder, MD;  [facility];  Service: Endoscopy;;  gastric  . CARDIAC CATHETERIZATION  "several"  . CARPAL TUNNEL RELEASE Bilateral   . CATARACT EXTRACTION W/PHACO Right 06/15/2016   Procedure: CATARACT EXTRACTION PHACO AND INTRAOCULAR LENS PLACEMENT (IOC);  Surgeon: Rutherford Guys, MD;  Location: [facility];  Service: Ophthalmology;  Laterality: Right;  CDE: 4.64  . CATARACT EXTRACTION W/PHACO Left 07/13/2016   Procedure: CATARACT EXTRACTION PHACO AND INTRAOCULAR LENS PLACEMENT (IOC);  Surgeon: Rutherford Guys, MD;  Location: [facility];  Service: Ophthalmology;  Laterality: Left;  CDE: 3.15  . COLONOSCOPY  12/28/2010   SLF: (MAC)Internal hemorrhoids/four small colon  polyps tubular adenomas. per SLF: colonoscopy 2022  . COLONOSCOPY WITH PROPOFOL N/A 07/05/2017   Procedure: COLONOSCOPY WITH PROPOFOL;  Surgeon: Danie Binder, MD;  [facility];  Service: Endoscopy;  Laterality: N/A;  7:30am  . CORONARY ARTERY BYPASS GRAFT  2002   3 vessels  . ESOPHAGOGASTRODUODENOSCOPY N/A 05/30/2012   SLF: UNCONTROLLED GERD DUE TO LIFESTYLE CHOICE/WEIGHT GAIN/MILD Non-erosive gastritis  . ESOPHAGOGASTRODUODENOSCOPY (EGD) WITH PROPOFOL N/A 03/02/2016   Dr. Oneida Alar: Esophagus appeared normal, impaired dilation performed, patchy inflammation with edema and erythema of the entire stomach., Biopsy with H pylori, patient completed Pylera.   [name] Kitchen FRACTURE SURGERY    . LEFT HEART CATHETERIZATION WITH CORONARY ANGIOGRAM N/A 08/31/2011   Procedure: LEFT HEART CATHETERIZATION WITH CORONARY ANGIOGRAM;  Surgeon: Laverda Page, MD;  Location: [facility] CATH LAB;  Service: Cardiovascular;  Laterality: N/A;  . LUMBAR DISC SURGERY     "L4-5; Dr. Trenton Gammon"  . MULTIPLE TOOTH EXTRACTIONS    . ORIF MANDIBULAR FRACTURE N/A 12/19/2015   Procedure: OPEN REDUCTION INTERNAL FIXATION (ORIF) MANDIBULAR FRACTURE;  Surgeon: Izora Gala, MD;  Location: [facility];  Service: ENT;  Laterality: N/A;  . PATELLA FRACTURE SURGERY Left 1976   plate to knee cap from accident  . POLYPECTOMY  07/05/2017   Procedure: POLYPECTOMY;  Surgeon: Danie Binder, MD;  [facility];  Service: Endoscopy;;  colon  . SAVORY DILATION  12/28/2010   SLF:(MAC)J-shaped stomach/nodular mocosa in the distal esophagus/empiric dilation 28m  . SAVORY DILATION N/A 03/02/2016   Procedure: SAVORY DILATION;  Surgeon: SDanie Binder MD;  [facility];  Service: Endoscopy;  Laterality: N/A;    Current Outpatient Medications  Medication Sig Dispense Refill  . albuterol (PROVENTIL HFA;VENTOLIN HFA) 108 (90 Base) MCG/ACT inhaler Inhale 2 puffs into the lungs every 6 (six) hours as needed for wheezing or shortness of breath.       . alprazolam (XANAX) 2 MG tablet Take 2 mg by mouth 2 (two) times daily.    .[name] Kitchenaspirin EC 81 MG tablet Take 81 mg by mouth at bedtime.    .[name] Kitchenatorvastatin (LIPITOR) 80 MG tablet Take 1 tablet (80 mg total) by mouth daily. 90 tablet 3  . beclomethasone (QVAR) 80 MCG/ACT inhaler Inhale 2 puffs into the lungs 2 (two) times daily.    . cetirizine (ZYRTEC) 10 MG tablet Take 10 mg by mouth daily as needed for allergies.   0  . dexlansoprazole (DEXILANT) 60 MG capsule Take 1 capsule (60 mg total) by mouth daily. 30 capsule 5  . fluticasone (FLONASE) 50 MCG/ACT nasal spray Place 2 sprays into both nostrils daily as needed for allergies.     . furosemide (LASIX) 40 MG tablet Take 1 tablet (40 mg total) by mouth daily. 90 tablet 3  . glipiZIDE (GLUCOTROL) 10 MG tablet Take 10 mg by mouth daily before  breakfast.    . hydrochlorothiazide (HYDRODIURIL) 25 MG tablet Take 1 tablet (25 mg total) by mouth daily. 30 tablet 0  . ipratropium (ATROVENT) 0.02 % nebulizer solution Take 0.5 mg by nebulization 2 (two) times daily as needed (for congestion).    . isosorbide mononitrate (IMDUR) 30 MG 24 hr tablet Take 1/2 tablet by mouth daily 180 tablet 3  . lisinopril (PRINIVIL,ZESTRIL) 10 MG tablet Take 10 mg by mouth daily.    [name] Kitchen LYRICA 100 MG capsule Take 100 mg by mouth 2 (two) times daily as needed (pain).   0  . meclizine (ANTIVERT) 25 MG tablet Take 25 mg by mouth 3 (three) times daily as needed for dizziness.    . metFORMIN (GLUCOPHAGE) 500 MG tablet Take 1 tablet (500 mg total) by mouth daily with breakfast. (Patient taking differently: Take 500 mg by mouth 2 (two) times daily with a meal. ) 30 tablet 0  . metoprolol tartrate (LOPRESSOR) 25 MG tablet Take 0.5 tablets (12.5 mg total) by mouth 2 (two) times daily. 30 tablet 0  . NARCAN 4 MG/0.1ML LIQD nasal spray kit Place 1 spray into the nose once.   0  . nitroGLYCERIN (NITROSTAT) 0.4 MG SL tablet Place 1 tablet (0.4 mg total) under the tongue every 5 (five) minutes  as needed for chest pain. 90 tablet 3  . oxyCODONE-acetaminophen (PERCOCET) 10-325 MG tablet Take 1 tablet by mouth every 4 (four) hours as needed for pain.    . potassium chloride (K-DUR,KLOR-CON) 10 MEQ tablet Take 1 tablet (10 mEq total) by mouth daily. 30 tablet 0  . ranitidine (ZANTAC) 300 MG tablet take 1 tablet by mouth at bedtime 30 tablet 5  . sertraline (ZOLOFT) 100 MG tablet Take 100 mg by mouth daily.    . sitaGLIPtin (JANUVIA) 100 MG tablet Take 100 mg by mouth daily.    . tamsulosin (FLOMAX) 0.4 MG CAPS capsule Take 1 capsule (0.4 mg total) by mouth daily after breakfast. 30 capsule 0   No current facility-administered medications for this visit.     Allergies as of 08/17/2017 - Review Complete 07/05/2017  Allergen Reaction Noted  . Gabapentin Hives   . Ibuprofen Other (See Comments)   . Zolpidem tartrate Other (See Comments)   . Naproxen Other (See Comments)     Family History  Problem Relation Age of Onset  . Diabetes Mother   . Hypertension Mother   . Heart attack Mother 43  . Hypertension Father   . Diabetes Father   . Heart attack Father 48  . Heart attack Unknown        mother, father, brother, sister all deceased due to MI  . Heart attack Sister   . Heart attack Brother   . Seizures Brother   . Heart failure Other   . Colon cancer Neg Hx   . Liver disease Neg Hx   . Anesthesia problems Neg Hx   . Hypotension Neg Hx   . Malignant hyperthermia Neg Hx   . Pseudochol deficiency Neg Hx   . Colon polyps Neg Hx     Social History   Socioeconomic History  . Marital status: Single    Spouse name: Not on file  . Number of children: 2  . Years of education: Not on file  . Highest education level: Not on file  Occupational History  . Occupation: disabled    Fish farm manager: NOT EMPLOYED  Social Needs  . Financial resource strain: Not on file  . Food  insecurity:    Worry: Not on file    Inability: Not on file  . Transportation needs:    Medical: Not on file     Non-medical: Not on file  Tobacco Use  . Smoking status: Never Smoker  . Smokeless tobacco: Never Used  Substance and Sexual Activity  . Alcohol use: No    Alcohol/week: 0.0 oz  . 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 Sexual activity: Not Currently  Lifestyle  . Physical activity:    Days per week: Not on file    Minutes per session: Not on file  . Stress: Not on file  Relationships  . Social connections:    Talks on phone: Not on file    Gets together: Not on file    Attends religious service: Not on file    Active member of club or organization: Not on file    Attends meetings of clubs or organizations: Not on file    Relationship status: Not on file  Other Topics Concern  . Not on file  Social History Narrative   Lives w/ son-23/22    Review of Systems: General: Negative for anorexia, weight loss, fever, chills, fatigue, weakness. Eyes: Negative for vision changes.  ENT: Negative for hoarseness, difficulty swallowing , nasal congestion. CV: Negative for chest pain, angina, palpitations, dyspnea on exertion, peripheral edema.  Respiratory: Negative for dyspnea at rest, dyspnea on exertion, cough, sputum, wheezing.  GI: See history of present illness. GU:  Negative for dysuria, hematuria, urinary incontinence, urinary frequency, nocturnal urination.  MS: Negative for joint pain, low back pain.  Derm: Negative for rash or itching.  Neuro: Negative for weakness, abnormal sensation, seizure, frequent headaches, memory loss, confusion.  Psych: Negative for anxiety, depression, suicidal ideation, hallucinations.  Endo: Negative for unusual weight change.  Heme: Negative for bruising or bleeding. Allergy: Negative for rash or hives.   Physical Exam: There were no vitals taken for this visit. General:   Alert and oriented. Pleasant and cooperative. Well-nourished and well-developed.  Head:   Normocephalic and atraumatic. Eyes:  Without icterus, sclera clear and conjunctiva pink.  Ears:  Normal auditory acuity. Mouth:  No deformity or lesions, oral mucosa pink.  Throat/Neck:  Supple, without mass or thyromegaly. Cardiovascular:  S1, S2 present without murmurs appreciated. Normal pulses noted. Extremities without clubbing or edema. Respiratory:  Clear to auscultation bilaterally. No wheezes, rales, or rhonchi. No distress.  Gastrointestinal:  +BS, soft, non-tender and non-distended. No HSM noted. No guarding or rebound. No masses appreciated.  Rectal:  Deferred  Musculoskalatal:  Symmetrical without gross deformities. Normal posture. Skin:  Intact without significant lesions or rashes. Neurologic:  Alert and oriented x4;  grossly normal neurologically. Psych:  Alert and cooperative. Normal mood and affect. Heme/Lymph/Immune: No significant cervical adenopathy. No excessive bruising noted.    08/17/2017 8:40 AM   Disclaimer: This note was dictated with voice recognition software. Similar sounding words can inadvertently be transcribed and may not be corrected upon review.

## 2017-09-11 NOTE — Progress Notes (Signed)
Follow-up Outpatient Visit Date: 09/12/2017  Primary Care Provider: Nolene Ebbs, MD [address] [place] [address]  Chief Complaint: Swelling and chest pain  HPI:  Dalton [name] is a 57 y.o. year-old male with history of coronary artery disease status post single-vessel CABG (LIMA to LAD; most recent cardiac catheterization showed atretic LIMA with normal native coronary arteries) and report of multiple prior MIs, cardiomyopathy, HTN, HLD, type 2 diabetes mellitus, rheumatoid arthritis, DVT, peptic ulcer disease, bipolar disorder, and cocaine use, who presents for follow-up of CAD and cardiomyopathy.  I last saw him in April, at which time he reported increased leg edema and exertional dyspnea.  He also reported occasional chest pain that was relieved by isosorbide mononitrate.  However, he was only using this intermittently due to headaches.  We therefore agreed to decrease isosorbide mononitrate to 15 mg daily.  We also started furosemide 40 mg daily.  Subsequent echo showed normal LVEF without any significant abnormalities.  Today, Dalton [name] reports that he has been experiencing more swelling over the last 2 weeks.  He also has had intermittent chest discomfort that at first he thought was heartburn.  The discomfort might be slightly worse than what his baseline angina is.  It is not exertional or related to other factors.  He is concerned that his heart "feels a bit weak."  He took extra furosemide when his swelling began and notes that it has since returned to baseline.  He has stable exertional dyspnea and notes that his weight is "up and down."  He denies palpitations and lightheadedness.  Dalton [name] states that he does not sleep well at night, though he does not attribute this to orthopnea or breathing difficulties.  Sleep study is currently pending.  --------------------------------------------------------------------------------------------------  Cardiovascular History  & Procedures: Cardiovascular Problems:  Coronary artery disease s/p single-vessel CABG (5573)  Diastolic heart failure  Risk Factors:  Known CAD, hypertension, hyperlipidemia, obesity, male gender, and history of substance abuse  Cath/PCI:  LHC (08/31/11, Dr. Einar Gip): LMCA normal. LAD normal. LAD normal. RCA tortuous but otherwise normal. LVEF 55-60%. LVEDP 19 mmHg. Patent left subclavian artery stent with atretic LIMA.  LHC (10/24/08): LMCA normal. LAD with midvessel bridge and 40% stenosis in systole. Ostial LAD with 30% stenosis. LCx with mild luminal irregularities. RCA with mild luminal irregularities. Patent left subclavian artery stent. LIMA -> LAD proximally occluded.  CV Surgery:  CABG (2000): LIMA -> LAD  EP Procedures and Devices:  None  Non-Invasive Evaluation(s):  TTE (06/30/17): Normal LV size with LVEF 60-65%.  Mild aortic valve thickening.  Normal RV size and function.  Pharmacologic myocardial perfusion stress test (12/25/15): Low risk study without ischemia or scar. LVEF 54% with normal wall motion.  TTE (11/14/15): Normal LV size and systolic function (EF 22-02%). Normal wall motion. Grade 2 diastolic dysfunction. Aortic sclerosis without stenosis. Normal RV size and function.   Recent CV Pertinent Labs: Lab Results  Component Value Date   CHOL 226 (H) 06/17/2017   HDL 42 06/17/2017   LDLCALC 156 (H) 06/17/2017   TRIG 139 06/17/2017   CHOLHDL 5.4 (H) 06/17/2017   CHOLHDL 6.4 12/16/2015   INR 1.08 12/10/2015   K 3.6 06/28/2017   MG 1.8 11/16/2015   BUN 8 06/28/2017   BUN 9 06/17/2017   CREATININE 0.80 06/28/2017    Past medical and surgical history were reviewed and updated in EPIC.  Current Meds  Medication Sig  . albuterol (PROVENTIL HFA;VENTOLIN HFA) 108 (90 Base) MCG/ACT inhaler  Inhale 2 puffs into the lungs every 6 (six) hours as needed for wheezing or shortness of breath.   . alprazolam (XANAX) 2 MG tablet Take 2 mg by mouth 2 (two) times  daily.  [name] Kitchen aspirin EC 81 MG tablet Take 81 mg by mouth at bedtime.  [name] Kitchen atorvastatin (LIPITOR) 80 MG tablet Take 1 tablet (80 mg total) by mouth daily.  . beclomethasone (QVAR) 80 MCG/ACT inhaler Inhale 2 puffs into the lungs 2 (two) times daily.  . cetirizine (ZYRTEC) 10 MG tablet Take 10 mg by mouth daily as needed for allergies.   [name] Kitchen dexlansoprazole (DEXILANT) 60 MG capsule Take 1 capsule (60 mg total) by mouth daily.  . fluticasone (FLONASE) 50 MCG/ACT nasal spray Place 2 sprays into both nostrils daily as needed for allergies.   . furosemide (LASIX) 40 MG tablet Take 1 tablet (40 mg total) by mouth daily.  [name] Kitchen glipiZIDE (GLUCOTROL) 10 MG tablet Take 10 mg by mouth daily before breakfast.  . hydrochlorothiazide (HYDRODIURIL) 25 MG tablet Take 1 tablet (25 mg total) by mouth daily.  [name] Kitchen ipratropium (ATROVENT) 0.02 % nebulizer solution Take 0.5 mg by nebulization 2 (two) times daily as needed (for congestion).  . isosorbide mononitrate (IMDUR) 30 MG 24 hr tablet Take 1/2 tablet by mouth daily  . lisinopril (PRINIVIL,ZESTRIL) 10 MG tablet Take 10 mg by mouth daily.  [name] Kitchen LYRICA 100 MG capsule Take 100 mg by mouth 2 (two) times daily as needed (pain).   . meclizine (ANTIVERT) 25 MG tablet Take 25 mg by mouth 3 (three) times daily as needed for dizziness.  . metFORMIN (GLUCOPHAGE) 500 MG tablet Take 1 tablet (500 mg total) by mouth daily with breakfast. (Patient taking differently: Take 500 mg by mouth 2 (two) times daily with a meal. )  . metoprolol tartrate (LOPRESSOR) 25 MG tablet Take 0.5 tablets (12.5 mg total) by mouth 2 (two) times daily.  [name] Kitchen NARCAN 4 MG/0.1ML LIQD nasal spray kit Place 1 spray into the nose once.   . nitroGLYCERIN (NITROSTAT) 0.4 MG SL tablet Place 1 tablet (0.4 mg total) under the tongue every 5 (five) minutes as needed for chest pain.  [name] Kitchen oxyCODONE-acetaminophen (PERCOCET) 10-325 MG tablet Take 1 tablet by mouth every 4 (four) hours as needed for pain.  . potassium chloride  (K-DUR,KLOR-CON) 10 MEQ tablet Take 1 tablet (10 mEq total) by mouth daily.  . ranitidine (ZANTAC) 300 MG tablet take 1 tablet by mouth at bedtime  . sertraline (ZOLOFT) 100 MG tablet Take 100 mg by mouth daily.  . sitaGLIPtin (JANUVIA) 100 MG tablet Take 100 mg by mouth daily.  . tamsulosin (FLOMAX) 0.4 MG CAPS capsule Take 1 capsule (0.4 mg total) by mouth daily after breakfast.    Allergies: Gabapentin; Ibuprofen; Zolpidem tartrate; and Naproxen  Social History   Tobacco Use  . Smoking status: Never Smoker  . Smokeless tobacco: Never Used  Substance Use Topics  . Alcohol use: No    Alcohol/week: 0.0 oz  . Drug use: Not Currently    Types: "Crack" cocaine, Cocaine, Marijuana    Comment: clean 10 years (as of 05/12/2017)    Family History  Problem Relation Age of Onset  . Diabetes Mother   . Hypertension Mother   . Heart attack Mother 59  . Hypertension Father   . Diabetes Father   . Heart attack Father 70  . Heart attack Unknown        mother, father, brother, sister all deceased due to MI  .  Heart attack Sister   . Heart attack Brother   . Seizures Brother   . Heart failure Other   . Colon cancer Neg Hx   . Liver disease Neg Hx   . Anesthesia problems Neg Hx   . Hypotension Neg Hx   . Malignant hyperthermia Neg Hx   . Pseudochol deficiency Neg Hx   . Colon polyps Neg Hx     Review of Systems: Review of Systems  Constitutional: Negative.   HENT: Negative.   Eyes: Negative.   Respiratory: Positive for shortness of breath.   Cardiovascular: Positive for chest pain.  Gastrointestinal: Positive for heartburn.  Genitourinary: Negative.   Musculoskeletal: Positive for back pain and myalgias.  Skin: Negative.   Neurological: Positive for headaches.  Endo/Heme/Allergies: Negative.   Psychiatric/Behavioral: Negative.    --------------------------------------------------------------------------------------------------  Physical Exam: BP 100/80   Pulse 68   Ht  5' 10" (1.778 m)   Wt 257 lb 1.9 oz (116.6 kg)   SpO2 98%   BMI 36.89 kg/m   General: NAD. HEENT: No conjunctival pallor or scleral icterus. Moist mucous membranes.  OP clear. Neck: Supple without lymphadenopathy, thyromegaly, JVD, or HJR. Lungs: Normal work of breathing. Clear to auscultation bilaterally without wheezes or crackles. Heart: Regular rate and rhythm without murmurs, rubs, or gallops. Non-displaced PMI. Abd: Bowel sounds present. Soft, NT/ND without hepatosplenomegaly Ext: No lower extremity edema. Skin: Warm and dry without rash.  EKG: Normal sinus rhythm with isolated PAC.  Otherwise, no significant abnormality.  Lab Results  Component Value Date   WBC 3.8 (L) 06/28/2017   HGB 12.2 (L) 06/28/2017   HCT 36.2 (L) 06/28/2017   MCV 87.0 06/28/2017   PLT 189 06/28/2017    Lab Results  Component Value Date   NA 138 06/28/2017   K 3.6 06/28/2017   CL 104 06/28/2017   CO2 28 06/28/2017   BUN 8 06/28/2017   CREATININE 0.80 06/28/2017   GLUCOSE 69 06/28/2017   ALT 14 06/17/2017    Lab Results  Component Value Date   CHOL 226 (H) 06/17/2017   HDL 42 06/17/2017   LDLCALC 156 (H) 06/17/2017   TRIG 139 06/17/2017   CHOLHDL 5.4 (H) 06/17/2017    --------------------------------------------------------------------------------------------------  ASSESSMENT AND PLAN: Stable angina Dalton [name] notes minimal increase in vague chest discomfort and I am not convinced that this is angina, given multiple ischemic work-ups over the years that have not shown any objective ischemia.  Given that he complains of intermittent headaches despite de-escalation of isosorbide mononitrate to 15 mg daily at our last visit, we have agreed to stop this medication altogether and start ranolazine 500 mg twice daily.  We will continue indefinite low-dose aspirin as well as his current dose of metoprolol tartrate 12.5 mg twice daily.  I am hesitant to escalate this further given borderline  low blood pressure.  Chronic HFpEF Dalton [name] appears euvolemic.  He has noted increased leg swelling over the last few weeks, though this is back to baseline with additional as needed furosemide.  I have counseled him on minimizing his sodium restriction.  We will continue furosemide 40 mg p.o. daily, with an additional dose as needed for weight gain or significant swelling.  Hyperlipidemia LDL suboptimally controlled in April.  Dalton [name] is now on atorvastatin 80 mg daily.  We will continue with this indefinitely with plans for repeat fasting lipid panel at his convenience  Hypertension Blood pressure low normal.  In the setting of ongoing furosemide use,  I will discontinue standing hydrochlorothiazide.  We will also stop HCTZ, as above.  Continue current dose of metoprolol tartrate.  Follow-up: Return to clinic in 3 months in [place], given my transition to [place] and the patient's residence in [place].  Nelva Bush, MD 09/12/2017 8:20 AM

## 2017-09-12 ENCOUNTER — Encounter: Payer: Self-pay | Admitting: Internal Medicine

## 2017-09-12 ENCOUNTER — Ambulatory Visit (INDEPENDENT_AMBULATORY_CARE_PROVIDER_SITE_OTHER): Payer: Medicare Other | Admitting: Internal Medicine

## 2017-09-12 VITALS — BP 100/80 | HR 68 | Ht 70.0 in | Wt 257.1 lb

## 2017-09-12 DIAGNOSIS — I5032 Chronic diastolic (congestive) heart failure: Secondary | ICD-10-CM | POA: Diagnosis not present

## 2017-09-12 DIAGNOSIS — I208 Other forms of angina pectoris: Secondary | ICD-10-CM | POA: Diagnosis not present

## 2017-09-12 DIAGNOSIS — I10 Essential (primary) hypertension: Secondary | ICD-10-CM

## 2017-09-12 DIAGNOSIS — E785 Hyperlipidemia, unspecified: Secondary | ICD-10-CM

## 2017-09-12 MED ORDER — RANOLAZINE ER 500 MG PO TB12: 500.0000 mg | ORAL_TABLET | Freq: Two times a day (BID) | ORAL | 3 refills | Status: DC

## 2017-09-12 NOTE — Patient Instructions (Addendum)
Medication Instructions:   STOP TAKING IMDUR 30 MG  START TAKING RENEXA 500 MG TWICE A DAY   If you need a refill on your cardiac medications before your next appointment, please call your pharmacy.  Labwork: NONE ORDERED  TODAY   Testing/Procedures: NONE ORDERED  TODAY     Follow-Up: WITH PROVIDER IN [place]  TO BE ESTABLISHED PRIMARY CARDIOLOGIST   DR BRANCH OR ANY AVAILABLE PROVIDER IN 3 MONTHS    Any Other Special Instructions Will Be Listed Below (If Applicable).

## 2017-09-13 ENCOUNTER — Encounter: Payer: Self-pay | Admitting: Internal Medicine

## 2017-09-14 ENCOUNTER — Other Ambulatory Visit: Payer: Self-pay

## 2017-09-14 DIAGNOSIS — Z79899 Other long term (current) drug therapy: Secondary | ICD-10-CM

## 2017-09-20 ENCOUNTER — Other Ambulatory Visit: Payer: Medicare Other

## 2017-09-20 DIAGNOSIS — E785 Hyperlipidemia, unspecified: Secondary | ICD-10-CM

## 2017-09-20 LAB — LIPID PANEL
CHOL/HDL RATIO: 5 ratio (ref 0.0–5.0)
Cholesterol, Total: 196 mg/dL (ref 100–199)
HDL: 39 mg/dL — AB (ref >39)
LDL Calculated: 115 mg/dL — ABNORMAL HIGH (ref 0–99)
Triglycerides: 208 mg/dL — ABNORMAL HIGH (ref 0–149)
VLDL Cholesterol Cal: 42 mg/dL — ABNORMAL HIGH (ref 5–40)

## 2017-09-20 LAB — ALT: ALT: 34 IU/L (ref 0–44)

## 2017-09-21 ENCOUNTER — Other Ambulatory Visit: Payer: Self-pay

## 2017-09-21 ENCOUNTER — Telehealth: Payer: Self-pay

## 2017-09-21 DIAGNOSIS — E785 Hyperlipidemia, unspecified: Secondary | ICD-10-CM

## 2017-09-21 MED ORDER — ATORVASTATIN CALCIUM 80 MG PO TABS: 80.0000 mg | ORAL_TABLET | Freq: Every day | ORAL | 3 refills | Status: DC

## 2017-09-21 NOTE — Telephone Encounter (Signed)
Spoke to patient and reviewed Atorvastatin medication and discussed Lipid Clinic referral.  He verbalized understanding.

## 2017-09-21 NOTE — Telephone Encounter (Signed)
-----   Message from Nelva Bush, MD sent at 09/20/2017  3:46 PM EDT ----- Please let Dalton [name] know that his LDL remains suboptimally controlled at 115, though it has improved quite a bit compared with April.  I recommend that he be seen in the lipid clinic to discuss additional therapy to achieve goal LDL (less than 70).  In the meantime, he should continue with atorvastatin 80 mg daily.

## 2017-09-21 NOTE — Progress Notes (Unsigned)
Placed order for Lipid Clinic

## 2017-09-21 NOTE — Telephone Encounter (Signed)
Notes recorded by Frederik Schmidt, RN on 09/21/2017 at 8:47 AM EDT [email or web address] if questions in regards to Lipid Clinic. ------

## 2017-10-10 ENCOUNTER — Other Ambulatory Visit: Payer: Self-pay | Admitting: Internal Medicine

## 2017-10-10 NOTE — Telephone Encounter (Signed)
Refill Request.  

## 2017-10-18 ENCOUNTER — Ambulatory Visit (INDEPENDENT_AMBULATORY_CARE_PROVIDER_SITE_OTHER): Payer: Medicare Other

## 2017-10-18 ENCOUNTER — Encounter (INDEPENDENT_AMBULATORY_CARE_PROVIDER_SITE_OTHER): Payer: Self-pay

## 2017-10-18 DIAGNOSIS — I2510 Atherosclerotic heart disease of native coronary artery without angina pectoris: Secondary | ICD-10-CM | POA: Diagnosis not present

## 2017-10-18 DIAGNOSIS — E785 Hyperlipidemia, unspecified: Secondary | ICD-10-CM | POA: Diagnosis not present

## 2017-10-18 DIAGNOSIS — I739 Peripheral vascular disease, unspecified: Secondary | ICD-10-CM

## 2017-10-18 MED ORDER — EZETIMIBE 10 MG PO TABS: 10.0000 mg | ORAL_TABLET | Freq: Every day | ORAL | 3 refills | Status: DC

## 2017-10-18 NOTE — Progress Notes (Signed)
Patient ID: Dalton [name]                 DOB: 06-Jul-1960                    MRN: [id number]     HPI: Dalton [name] is a 57 y.o. male patient referred to lipid clinic by Dr. Saunders Revel. PMH is significant for HFpEF (60-65%), coronary artery disease status post CABG, multiple prior MIs, cardiomyopathy, HTN, HLD, type 2 diabetes mellitus, rheumatoid arthritis, DVT, peptic ulcer disease, and bipolar disorder. In April his atorvastatin was titrated to 63m daily. He presents to clinic for further lipid management.   Dalton [name] today in good spirits. He is tolerating atorvastatin 820mand reports no side effects. He is on a budget and eats what he can afford. Patient was counseled on ways to decrease fat and red meat in his diet. Patient exercises often as he walks most places he goes. He also has applied to the [employer] which he anticipates starting soon.  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 Anemia   . Anxiety   . Bipolar disorder (HCC)   Lav[place]HF (congestive heart failure) (HCC)   [place]   . Chronic lower back pain   . Congestive heart failure (CHF) (HCC)   StorlaCoronary artery disease   . Coughing   . Depression   . DVT (deep venous thrombosis) (HCC) ~ [place]   LLE  . Frequency of urination   . GERD (gastroesophageal reflux disease)   . 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 HeadachIRSWNIOE(703.5 few times/week" (12/24/2015)  . High cholesterol   . HNP (herniated nucleus pulposus), cervical   . Hypertension   . Laceration of right hand 11/27/2010  . Laceration of wrist 2007 BIL FOREARMS  . MI (myocardial infarction) (HCC)   [place] last one was in 2011 (12/24/2015)  . Neuropathy   . NSAID-induced gastric ulcer    "Ibuprofen"  . PUD (peptic ulcer disease)    in 1990s, secondary to medication  . Rheumatoid arthritis (HCC)   [place] over" (12/24/2015)  . Shortness of breath    with exertion  . Tonsillitis, chronic    Dr. Steven Vicki [place] II diabetes mellitus (HCC)   [place] glasses     Current Outpatient Medications on File Prior to Visit  Medication Sig Dispense Refill  . albuterol (PROVENTIL HFA;VENTOLIN HFA) 108 (90 Base) MCG/ACT inhaler Inhale 2 puffs into the lungs  every 6 (six) hours as needed for wheezing or shortness of breath.     . alprazolam (XANAX) 2 MG tablet Take 2 mg by mouth 2 (two) times daily.    [name] Kitchen aspirin EC 81 MG tablet Take 81 mg by mouth at bedtime.    [name] Kitchen atorvastatin (LIPITOR) 80 MG tablet Take 1 tablet (80 mg total) by mouth daily. 90 tablet 3  . beclomethasone (QVAR) 80 MCG/ACT inhaler Inhale 2 puffs into the lungs 2 (two) times daily.    . cetirizine (ZYRTEC) 10 MG tablet Take 10 mg by mouth daily as needed for allergies.   0  . dexlansoprazole (DEXILANT) 60 MG capsule Take 1 capsule (60 mg total) by mouth daily. 30 capsule 5  . fluticasone (FLONASE) 50 MCG/ACT nasal spray Place 2 sprays into both nostrils daily as needed for allergies.     . furosemide (LASIX) 40 MG tablet Take 1 tablet (40 mg total) by mouth daily. 90 tablet 3  .  glipiZIDE (GLUCOTROL) 10 MG tablet Take 10 mg by mouth daily before breakfast.    . ipratropium (ATROVENT) 0.02 % nebulizer solution Take 0.5 mg by nebulization 2 (two) times daily as needed (for congestion).    [name] Kitchen lisinopril (PRINIVIL,ZESTRIL) 10 MG tablet Take 10 mg by mouth daily.    [name] Kitchen LYRICA 100 MG capsule Take 100 mg by mouth 2 (two) times daily as needed (pain).   0  . meclizine (ANTIVERT) 25 MG tablet Take 25 mg by mouth 3 (three) times daily as needed for dizziness.    . metFORMIN (GLUCOPHAGE) 500 MG tablet Take 1 tablet (500 mg total) by mouth daily with breakfast. (Patient taking differently: Take 500 mg by mouth 2 (two) times daily with a meal. ) 30 tablet 0  . metoprolol tartrate (LOPRESSOR) 25 MG tablet Take 0.5 tablets (12.5 mg total) by mouth 2 (two) times daily. 30 tablet 0  . NARCAN 4 MG/0.1ML LIQD nasal spray kit Place 1 spray into the nose once.   0  . nitroGLYCERIN (NITROSTAT) 0.4 MG SL tablet PLACE ONE TABLET UNDER TONGUE EVERY FIVE MINUTES AS NEEDED FOR CHEST PAIN 100 tablet 3  . oxyCODONE-acetaminophen (PERCOCET) 10-325 MG tablet Take 1 tablet by mouth every 4 (four) hours as needed for pain.    . potassium chloride (K-DUR,KLOR-CON) 10 MEQ tablet Take 1 tablet (10 mEq total) by mouth daily. 30 tablet 0  . ranitidine (ZANTAC) 300 MG tablet take 1 tablet by mouth at bedtime 30 tablet 5  . ranolazine (RANEXA) 500 MG 12 hr tablet Take 1 tablet (500 mg total) by mouth 2 (two) times daily. 60 tablet 3  . sertraline (ZOLOFT) 100 MG tablet Take 100 mg by mouth daily.    . sitaGLIPtin (JANUVIA) 100 MG tablet Take 100 mg by mouth daily.    . tamsulosin (FLOMAX) 0.4 MG CAPS capsule Take 1 capsule (0.4 mg total) by mouth daily after breakfast. 30 capsule 0   No current facility-administered medications on file prior to visit.     Allergies  Allergen Reactions  . Gabapentin Hives  . Ibuprofen Other (See Comments)    Stomach  Upset and stomach ulcers  . Zolpidem Tartrate Other (See  Comments)    Hallucinations   . Naproxen Other (See Comments)    HALLUCINATIONS    Assessment/Plan:  1. Hyperlipidemia - Patient's LDL is not at goal of <34m/dl. Start ezetimibe 144mdaily for anticipated 20% lowering, however this will likely not get patient to goal of <7019ml.  Patient agreeable to try PCSK9-I therapy. Will start [place] process for insurance approval of PCSK9-I and file for patient assistance for copay. Patient was able to fill out paper work in office. Will schedule lipid panel in three months or after the 3rd dose of PCSK9-I if patient is approved and f/u with lipid clinic at that time.    Isaias Sakai, Sherian Rein D PGY1 Pharmacy Resident  Phone [phone] 10/18/2017      2:02 PM

## 2017-10-18 NOTE — Patient Instructions (Addendum)
It was to see you today.   Your LDL is not at goal of less than 70mg /dL. It was 115mg /dL in July.   Start taking ezetimibe 10mg  daily.  We will start the process to get you approved for a PCSK9- inhibitor (Repatha or Praluent) to help lower your cholesterol more. PCSK9- inhibitors are injectable medication that you use every two weeks to help decrease your cholesterol. We will work with your insurance and copay assistance to get these medications affordable to you.   Continue taking atorvastatin 80mg  daily.   Continue exercising and decreasing high fat foods and red meats from your diet.

## 2017-10-19 ENCOUNTER — Telehealth: Payer: Self-pay | Admitting: Pharmacist

## 2017-10-19 NOTE — Telephone Encounter (Signed)
Pt approved for Repatha through insurance.  

## 2017-10-20 ENCOUNTER — Encounter: Payer: Self-pay | Admitting: Pharmacist

## 2017-10-20 MED ORDER — EVOLOCUMAB 140 MG/ML ~~LOC~~ SOAJ: 140.0000 mg | SUBCUTANEOUS | 11 refills | Status: DC

## 2017-10-20 NOTE — Telephone Encounter (Signed)
Rx sent to pharmacy   

## 2017-10-26 NOTE — Telephone Encounter (Signed)
Per [facility], pt has a $0 copay for now.   Spoke with patient who took first injection on Sunday. Pt with appt in [place] on 10/25 (which will be after 4th dose of medication). Will ask Dr. Harl Bowie to draw lipid/hepatic labs at that visit. Provided pt our phone number should he have any issues.

## 2017-11-29 ENCOUNTER — Ambulatory Visit (INDEPENDENT_AMBULATORY_CARE_PROVIDER_SITE_OTHER): Payer: Medicare Other | Admitting: Nurse Practitioner

## 2017-11-29 ENCOUNTER — Encounter: Payer: Self-pay | Admitting: Nurse Practitioner

## 2017-11-29 VITALS — BP 116/79 | HR 62 | Temp 97.9°F | Ht 70.0 in | Wt 257.6 lb

## 2017-11-29 DIAGNOSIS — K5900 Constipation, unspecified: Secondary | ICD-10-CM

## 2017-11-29 DIAGNOSIS — R1030 Lower abdominal pain, unspecified: Secondary | ICD-10-CM | POA: Diagnosis not present

## 2017-11-29 DIAGNOSIS — R109 Unspecified abdominal pain: Secondary | ICD-10-CM | POA: Insufficient documentation

## 2017-11-29 DIAGNOSIS — K219 Gastro-esophageal reflux disease without esophagitis: Secondary | ICD-10-CM

## 2017-11-29 NOTE — Progress Notes (Signed)
CC'D TO PCP °

## 2017-11-29 NOTE — Progress Notes (Signed)
Referring Provider: Nolene Ebbs, MD Primary Care Physician:  Nolene Ebbs, MD Primary GI:  Dr. Oneida Alar  Chief Complaint  Patient presents with  . Constipation  . lower buttocks discomfort    HPI:   Dalton [name] is a 57 y.o. male who presents for follow-up on constipation and rectal bleeding.  Patient was last seen in our office 05/12/2017 for the same as well as colon adenomas.  Noted chronic history of constipation and GERD.  Previous history of tubular adenoma in 2012.  At his last visit he was doing okay overall, noted a couple episodes of rectal bleeding last month and again this month with some constipation.  Using MiraLAX for currently out.  Typically takes every other day.  Bleeding typically occurs with constipation but other times without constipation.  Blood in the commode, occasionally associated with abdominal pain in the lower abdomen which resolves with passing of gas and bowel movement.  Poor appetite.  No other GI symptoms.  Recommended MiraLAX every other day, colonoscopy, follow-up in 3 months.  Colonoscopy was completed 07/05/2017 which found an 8 mm polyp in the cecum, 4 mm polyp in the rectum, submucosal nodule at the hepatic flexure status post biopsy, diverticulosis in the rectosigmoid colon and sigmoid colon.  Rectal bleeding due to large internal hemorrhoids.  Surgical pathology found the polyps to be tubular adenoma and a submucosal nodule biopsy to be suggestive of a benign lipoma.  Recommended repeat colonoscopy in 3 years (2022).  Today he states he's doing about the same. Has intermittent lower abdominal discomfort, about 1-2 times a week. He is currently using beet juice to help constipation, which helped some; also on Miralax every other day to every day. Is trying to walk daily 1-2 miles. Has a bowel movement about every other day to every 3 days with straining, small stools, hard stools. Will use MgCitrate as needed. Denies hematochezia, melena. GERD  doing ok on Dexilant. Denies chest pain, dyspnea, dizziness, lightheadedness, syncope, near syncope. Denies any other upper or lower GI symptoms.  Past Medical History:  Diagnosis Date  . Anemia   . Anxiety   . Bipolar disorder ([place])   . CHF (congestive heart failure) ([place])   . Chronic back pain    Pain Clinic in [place]  . Chronic bronchitis   . Chronic lower back pain   . Congestive heart failure (CHF) ([place])   . Coronary artery disease   . Coughing   . Depression   . DVT (deep venous thrombosis) ([place]) ~ 2005   LLE  . Frequency of urination   . GERD (gastroesophageal reflux disease)   . Grand mal seizure [facility]    entire life, last seizure in 2011;unknown etiology-pt sts heriditary (12/24/2015)  . YKDXIPJA(250.5)    "a few times/week" (12/24/2015)  . High cholesterol   . HNP (herniated nucleus pulposus), cervical   . Hypertension   . Laceration of right hand 11/27/2010  . Laceration of wrist 2007 BIL FOREARMS  . MI (myocardial infarction) ([place])    7, last one was in 2011 (12/24/2015)  . Neuropathy   . NSAID-induced gastric ulcer    "Ibuprofen"  . PUD (peptic ulcer disease)    in 1990s, secondary to medication  . Rheumatoid arthritis ([place])    "all over" (12/24/2015)  . Shortness of breath    with exertion  . Tonsillitis, chronic    Dr. Vicki Mallet in [place]  . Type II diabetes mellitus ([place])   . Wears  glasses     Past Surgical History:  Procedure Laterality Date  . ANTERIOR CERVICAL DECOMP/DISCECTOMY FUSION N/A 11/05/2016   Procedure: Anterior Cervical Discectomy and Fusion - Cervical three-Cervical four;  Surgeon: Earnie Larsson, MD;  Location: [facility];  Service: Neurosurgery;  Laterality: N/A;  . BACK SURGERY     x3  . BIOPSY N/A 05/30/2012   Procedure: BIOPSY;  Surgeon: Danie Binder, MD;  [facility];  Service: Endoscopy;  Laterality: N/A;  . BIOPSY  03/02/2016   Procedure: BIOPSY;  Surgeon: Danie Binder, MD;  [facility];  Service: Endoscopy;;   gastric  . CARDIAC CATHETERIZATION  "several"  . CARPAL TUNNEL RELEASE Bilateral   . CATARACT EXTRACTION W/PHACO Right 06/15/2016   Procedure: CATARACT EXTRACTION PHACO AND INTRAOCULAR LENS PLACEMENT (IOC);  Surgeon: Rutherford Guys, MD;  Location: [facility];  Service: Ophthalmology;  Laterality: Right;  CDE: 4.64  . CATARACT EXTRACTION W/PHACO Left 07/13/2016   Procedure: CATARACT EXTRACTION PHACO AND INTRAOCULAR LENS PLACEMENT (IOC);  Surgeon: Rutherford Guys, MD;  Location: [facility];  Service: Ophthalmology;  Laterality: Left;  CDE: 3.15  . COLONOSCOPY  12/28/2010   SLF: (MAC)Internal hemorrhoids/four small colon polyps tubular adenomas. per SLF: colonoscopy 2022  . COLONOSCOPY WITH PROPOFOL N/A 07/05/2017   Procedure: COLONOSCOPY WITH PROPOFOL;  Surgeon: Danie Binder, MD;  [facility];  Service: Endoscopy;  Laterality: N/A;  7:30am  . CORONARY ARTERY BYPASS GRAFT  2002   3 vessels  . ESOPHAGOGASTRODUODENOSCOPY N/A 05/30/2012   SLF: UNCONTROLLED GERD DUE TO LIFESTYLE CHOICE/WEIGHT GAIN/MILD Non-erosive gastritis  . ESOPHAGOGASTRODUODENOSCOPY (EGD) WITH PROPOFOL N/A 03/02/2016   Dr. Oneida Alar: Esophagus appeared normal, impaired dilation performed, patchy inflammation with edema and erythema of the entire stomach., Biopsy with H pylori, patient completed Pylera.   [name] Kitchen FRACTURE SURGERY    . LEFT HEART CATHETERIZATION WITH CORONARY ANGIOGRAM N/A 08/31/2011   Procedure: LEFT HEART CATHETERIZATION WITH CORONARY ANGIOGRAM;  Surgeon: Laverda Page, MD;  Location[facility] C[facility] LAB;  Service: Cardiovascular;  Laterality: N/A;  . LUMBAR DISC SURGERY     "L4-5; Dr. Trenton Gammon"  . MULTIPLE TOOTH EXTRACTIONS    . ORIF MANDIBULAR FRACTURE N/A 12/19/2015   Procedure: OPEN REDUCTION INTERNAL FIXATION (ORIF) MANDIBULAR FRACTURE;  Surgeon: Izora Gala, MD;  Location: [facility];  Service: ENT;  Laterality: N/A;  . PATELLA FRACTURE SURGERY Left 1976   plate to knee cap from accident  . POLYPECTOMY  07/05/2017   Procedure:  POLYPECTOMY;  Surgeon: Danie Binder, MD;  [facility];  Service: Endoscopy;;  colon  . SAVORY DILATION  12/28/2010   SLF:(MAC)J-shaped stomach/nodular mocosa in the distal esophagus/empiric dilation 31m  . SAVORY DILATION N/A 03/02/2016   Procedure: SAVORY DILATION;  Surgeon: SDanie Binder MD;  [facility];  Service: Endoscopy;  Laterality: N/A;    Current Outpatient Medications  Medication Sig Dispense Refill  . albuterol (PROVENTIL HFA;VENTOLIN HFA) 108 (90 Base) MCG/ACT inhaler Inhale 2 puffs into the lungs every 6 (six) hours as needed for wheezing or shortness of breath.     . alprazolam (XANAX) 2 MG tablet Take 2 mg by mouth 2 (two) times daily.    .[name] Kitchenaspirin EC 81 MG tablet Take 81 mg by mouth at bedtime.    .[name] Kitchenatorvastatin (LIPITOR) 80 MG tablet Take 1 tablet (80 mg total) by mouth daily. 90 tablet 3  . beclomethasone (QVAR) 80 MCG/ACT inhaler Inhale 2 puffs into the lungs 2 (two) times daily.    .[name] Kitchen  cetirizine (ZYRTEC) 10 MG tablet Take 10 mg by mouth daily as needed for allergies.   0  . dexlansoprazole (DEXILANT) 60 MG capsule Take 1 capsule (60 mg total) by mouth daily. 30 capsule 5  . Evolocumab (REPATHA SURECLICK) 948 MG/ML SOAJ Inject 140 mg into the skin every 14 (fourteen) days. 2 pen 11  . ezetimibe (ZETIA) 10 MG tablet Take 1 tablet (10 mg total) by mouth daily. 90 tablet 3  . fluticasone (FLONASE) 50 MCG/ACT nasal spray Place 2 sprays into both nostrils daily as needed for allergies.     . furosemide (LASIX) 40 MG tablet Take 1 tablet (40 mg total) by mouth daily. 90 tablet 3  . glipiZIDE (GLUCOTROL) 10 MG tablet Take 10 mg by mouth daily before breakfast.    . ipratropium (ATROVENT) 0.02 % nebulizer solution Take 0.5 mg by nebulization 2 (two) times daily as needed (for congestion).    [name] Kitchen lisinopril (PRINIVIL,ZESTRIL) 10 MG tablet Take 10 mg by mouth daily.    [name] Kitchen LYRICA 100 MG capsule Take 100 mg by mouth 2 (two) times daily as needed (pain).   0  .  meclizine (ANTIVERT) 25 MG tablet Take 25 mg by mouth 3 (three) times daily as needed for dizziness.    . metFORMIN (GLUCOPHAGE) 500 MG tablet Take 1 tablet (500 mg total) by mouth daily with breakfast. (Patient taking differently: Take 500 mg by mouth 2 (two) times daily with a meal. ) 30 tablet 0  . metoprolol tartrate (LOPRESSOR) 25 MG tablet Take 0.5 tablets (12.5 mg total) by mouth 2 (two) times daily. 30 tablet 0  . nitroGLYCERIN (NITROSTAT) 0.4 MG SL tablet PLACE ONE TABLET UNDER TONGUE EVERY FIVE MINUTES AS NEEDED FOR CHEST PAIN 100 tablet 3  . oxyCODONE-acetaminophen (PERCOCET) 10-325 MG tablet Take 1 tablet by mouth every 4 (four) hours as needed for pain.    . potassium chloride (K-DUR,KLOR-CON) 10 MEQ tablet Take 1 tablet (10 mEq total) by mouth daily. 30 tablet 0  . ranitidine (ZANTAC) 300 MG tablet take 1 tablet by mouth at bedtime 30 tablet 5  . ranolazine (RANEXA) 500 MG 12 hr tablet Take 1 tablet (500 mg total) by mouth 2 (two) times daily. 60 tablet 3  . sertraline (ZOLOFT) 100 MG tablet Take 100 mg by mouth daily.    . sitaGLIPtin (JANUVIA) 100 MG tablet Take 100 mg by mouth daily.    . tamsulosin (FLOMAX) 0.4 MG CAPS capsule Take 1 capsule (0.4 mg total) by mouth daily after breakfast. 30 capsule 0  . NARCAN 4 MG/0.1ML LIQD nasal spray kit Place 1 spray into the nose once.   0   No current facility-administered medications for this visit.     Allergies as of 11/29/2017 - Review Complete 11/29/2017  Allergen Reaction Noted  . Gabapentin Hives   . Ibuprofen Other (See Comments)   . Zolpidem tartrate Other (See Comments)   . Naproxen Other (See Comments)     Family History  Problem Relation Age of Onset  . Diabetes Mother   . Hypertension Mother   . Heart attack Mother 63  . Hypertension Father   . Diabetes Father   . Heart attack Father 58  . Heart attack Unknown        mother, father, brother, sister all deceased due to MI  . Heart attack Sister   . Heart attack  Brother   . Seizures Brother   . Heart failure Other   . Colon cancer Neg  Hx   . Liver disease Neg Hx   . Anesthesia problems Neg Hx   . Hypotension Neg Hx   . Malignant hyperthermia Neg Hx   . Pseudochol deficiency Neg Hx   . Colon polyps Neg Hx     Social History   Socioeconomic History  . Marital status: Single    Spouse name: Not on file  . Number of children: 2  . Years of education: Not on file  . Highest education level: Not on file  Occupational History  . Occupation: disabled    Fish farm manager: NOT EMPLOYED  Social Needs  . Financial resource strain: Not on file  . Food insecurity:    Worry: Not on file    Inability: Not on file  . Transportation needs:    Medical: Not on file    Non-medical: Not on file  Tobacco Use  . Smoking status: Never Smoker  . Smokeless tobacco: Never Used  Substance and Sexual Activity  . Alcohol use: No    Alcohol/week: 0.0 standard drinks  . 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 Sexual activity: Not Currently  Lifestyle  . Physical activity:    Days per week: Not on file    Minutes per session: Not on file  . Stress: Not on file  Relationships  . Social connections:    Talks on phone: Not on file    Gets together: Not on file    Attends religious service: Not on file    Active member of club or organization: Not on file    Attends meetings of clubs or organizations: Not on file    Relationship status: Not on file  Other Topics Concern  . Not on file  Social History Narrative   Lives w/ son-23/22    Review of Systems: Complete ROS negative except as per HPI.  Physical Exam: BP 116/79   Pulse 62   Temp 97.9 F (36.6 C) (Oral)   Ht 5' 10" (1.778 m)   Wt 257 lb 9.6 oz (116.8 kg)   BMI 36.96 kg/m  General:   Alert and oriented. Pleasant and cooperative. Well-nourished and well-developed.  Eyes:  Without icterus, sclera clear and conjunctiva  pink.  Ears:  Normal auditory acuity. Cardiovascular:  S1, S2 present without murmurs appreciated. Extremities without clubbing or edema. Respiratory:  Clear to auscultation bilaterally. No wheezes, rales, or rhonchi. No distress.  Gastrointestinal:  +BS, soft, non-tender and non-distended. No HSM noted. No guarding or rebound. No masses appreciated.  Rectal:  Deferred  Musculoskalatal:  Symmetrical without gross deformities. Skin:  Intact without significant lesions or rashes. Neurologic:  Alert and oriented x4;  grossly normal neurologically. Psych:  Alert and cooperative. Normal mood and affect. Heme/Lymph/Immune: No excessive bruising noted.    11/29/2017 9:15 AM   Disclaimer: This note was dictated with voice recognition software. Similar sounding words can inadvertently be transcribed and may not be corrected upon review.

## 2017-11-29 NOTE — Assessment & Plan Note (Signed)
Lower abdominal pain when significantly constipated.  Likely due to constipation, typically improves with a bowel movement.  At this point we will try to better manage his constipation as per above.  Follow-up in 3 months.  Call if any worsening symptoms.

## 2017-11-29 NOTE — Assessment & Plan Note (Signed)
GERD remains well managed on Dexilant.  Recommend he continue current medications and follow-up as needed.

## 2017-11-29 NOTE — Assessment & Plan Note (Signed)
Chronic constipation and he has done well trying over-the-counter medications including MiraLAX and beet juice.  However he still continues to have hard stools, small stools with straining despite all attempts.  Occasionally takes magnesium citrate.  At this point I feel we have maximized over-the-counter options and we will attempt a prescription management.  He is on chronic pain management and opioid induced constipation is likely at least in part.  I will trial him on Amitiza 24 mcg twice daily on a full stomach.  Request progress report in a week.  Further medication management per results.  Follow-up in 3 months.

## 2017-11-29 NOTE — Patient Instructions (Addendum)
1. I am giving you samples of Amitiza 24 mcg.  Take this twice a day, on a full stomach. 2. I am giving you samples for a week.  Call us in 1 week and let us know if it is helping. 3. Return for follow-up in 3 months. 4. Continue other medications. 5. Call us if you have any questions or concerns.  At [facility] we value your feedback. You may receive a survey about your visit today. Please share your experience as we strive to create trusting relationships with our patients to provide genuine, compassionate, quality care.  We appreciate your understanding and patience as we review any laboratory studies, imaging, and other diagnostic tests that are ordered as we care for you. Our office policy is 5 business days for review of these results, and any emergent or urgent results are addressed in a timely manner for your best interest. If you do not hear from our office in 1 week, please contact us.   We also encourage the use of MyChart, which contains your medical information for your review as well. If you are not enrolled in this feature, an access code is on this after visit summary for your convenience. Thank you for allowing [place] to be involved in your care.  It was great to see you today!  I hope you have a great Fall!!

## 2017-12-14 ENCOUNTER — Ambulatory Visit: Payer: Medicare Other | Admitting: Cardiology

## 2017-12-16 ENCOUNTER — Ambulatory Visit (INDEPENDENT_AMBULATORY_CARE_PROVIDER_SITE_OTHER): Payer: Medicare Other | Admitting: Cardiology

## 2017-12-16 ENCOUNTER — Encounter: Payer: Self-pay | Admitting: Cardiology

## 2017-12-16 VITALS — BP 102/70 | HR 76 | Ht 70.0 in | Wt 269.0 lb

## 2017-12-16 DIAGNOSIS — E785 Hyperlipidemia, unspecified: Secondary | ICD-10-CM | POA: Diagnosis not present

## 2017-12-16 DIAGNOSIS — I10 Essential (primary) hypertension: Secondary | ICD-10-CM | POA: Diagnosis not present

## 2017-12-16 DIAGNOSIS — I5032 Chronic diastolic (congestive) heart failure: Secondary | ICD-10-CM | POA: Diagnosis not present

## 2017-12-16 DIAGNOSIS — I25118 Atherosclerotic heart disease of native coronary artery with other forms of angina pectoris: Secondary | ICD-10-CM

## 2017-12-16 MED ORDER — FUROSEMIDE 40 MG PO TABS: 60.0000 mg | ORAL_TABLET | Freq: Every day | ORAL | 1 refills | Status: DC

## 2017-12-16 NOTE — Patient Instructions (Signed)
Your physician recommends that you schedule a follow-up appointment in: [place] has recommended you make the following change in your medication:   STOP RANEXA   INCREASE LASIX 60 MG (1.5 TABLETS) DAILY   Your physician recommends that you return for lab work - CMP/LIPIDS/MG- PLEASE FAST 6-8 HOURS PRIOR TO LAB WORK  Thank you for choosing [facility]!!

## 2017-12-16 NOTE — Progress Notes (Signed)
Clinical Summary Dalton [name] is a 57 y.o.male last seen by Dr End, this is our first visit together.  1. CAD with stable angina - history of CABG LIMA-LAD - last cath showed atretic LIMA, normal coronaries  - imdur lowered to 45m daily due to headaches and later stopped - started on ranexa 5071mbid at 08/2017 visit with Dr End. Antianginal therapy somewhat limited due to soft bp's - since starting ranexa has had some fatigue, but chest pain improved.    2. Chronic heart failure with preserved EF - 06/2017 echo LVEF 6050-53%diastolic function not reported but normal parameters - some recent edema. Wears compression stockings. Limiting sodium intake.  - weight gain, by our scales up 12 lbs from earlier this month.  3. HTN - compliant with meds   4. Hyperlipidemia - started repatha and zetia 09/2017, needs repeat lipids and hepatic panel   5. GERD - followed by GI   Past Medical History:  Diagnosis Date  . Anemia   . Anxiety   . Bipolar disorder (HC[place]  . CHF (congestive heart failure) ([place]  . [place]   . Chronic lower back pain   . Congest[place] heart [place] (CHF) ([place]  . Coronary artery disease   . Coughing   . Depression   . DVT (deep venous thrombosis) (HC[place]ole[place]~ 2005   LLE  . Frequency of urination   . GERD (gastroesophageal reflux disease)   . 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   ent[facility], last seizure in 2011;unknown etiology-pt sts heriditary (12/24/2015)  . HeZJQBHALP(379.0   "a few times/week" (12/24/2015)  . High cholesterol   . HNP (herniated nucleus pulposus), cervical   . Hypertension   . Laceration of right hand 11/27/2010  . Laceration of wrist 2007 BIL FOREARMS  . MI (myocardial infarction) ([place]   7, last one was in 2011 (12/24/2015)  . Neuropathy   . NSAID-induced gastric ulcer    "Ibuprofen"  . PUD (peptic ulcer disease)    in 1990s, secondary to medication  . Rheumatoid  arthritis (HCSw[place]   "all over" (12/24/2015)  . Shortness of breath    with exertion  . Tonsillitis, chronic    Dr. StVicki Malletn [place]. Type II diabetes mellitus (HC[place]  . Wears glasses      Allergies  Allergen Reactions  . Gabapentin Hives  . Ibuprofen Other (See Comments)    Stomach  Upset and stomach ulcers  . Zolpidem Tartrate Other (See Comments)    Hallucinations   . Naproxen Other (See Comments)    HALLUCINATIONS     Current Outpatient Medications  Medication Sig Dispense Refill  . albuterol (PROVENTIL HFA;VENTOLIN HFA) 108 (90 Base) MCG/ACT inhaler Inhale 2 puffs into the lungs every 6 (six) hours as needed for wheezing or shortness of breath.     . alprazolam (XANAX) 2 MG tablet Take 2 mg by mouth 2 (two) times daily.    . [name] Kitchenspirin EC 81 MG tablet Take 81 mg by mouth at bedtime.    . [name] Kitchentorvastatin (LIPITOR) 80 MG tablet Take 1 tablet (80 mg total) by mouth daily. 90 tablet 3  . beclomethasone (QVAR) 80 MCG/ACT inhaler Inhale 2 puffs into the lungs 2 (two) times daily.    . cetirizine (ZYRTEC) 10 MG tablet Take 10 mg by mouth daily as needed for allergies.   0  .  dexlansoprazole (DEXILANT) 60 MG capsule Take 1 capsule (60 mg total) by mouth daily. 30 capsule 5  . Evolocumab (REPATHA SURECLICK) 409 MG/ML SOAJ Inject 140 mg into the skin every 14 (fourteen) days. 2 pen 11  . ezetimibe (ZETIA) 10 MG tablet Take 1 tablet (10 mg total) by mouth daily. 90 tablet 3  . fluticasone (FLONASE) 50 MCG/ACT nasal spray Place 2 sprays into both nostrils daily as needed for allergies.     . furosemide (LASIX) 40 MG tablet Take 1 tablet (40 mg total) by mouth daily. 90 tablet 3  . glipiZIDE (GLUCOTROL) 10 MG tablet Take 10 mg by mouth daily before breakfast.    . ipratropium (ATROVENT) 0.02 % nebulizer solution Take 0.5 mg by nebulization 2 (two) times daily as needed (for congestion).    [name] Kitchen lisinopril (PRINIVIL,ZESTRIL) 10 MG tablet Take 10 mg by mouth daily.    [name] Kitchen LYRICA 100 MG  capsule Take 100 mg by mouth 2 (two) times daily as needed (pain).   0  . meclizine (ANTIVERT) 25 MG tablet Take 25 mg by mouth 3 (three) times daily as needed for dizziness.    . metFORMIN (GLUCOPHAGE) 500 MG tablet Take 1 tablet (500 mg total) by mouth daily with breakfast. (Patient taking differently: Take 500 mg by mouth 2 (two) times daily with a meal. ) 30 tablet 0  . metoprolol tartrate (LOPRESSOR) 25 MG tablet Take 0.5 tablets (12.5 mg total) by mouth 2 (two) times daily. 30 tablet 0  . NARCAN 4 MG/0.1ML LIQD nasal spray kit Place 1 spray into the nose once.   0  . nitroGLYCERIN (NITROSTAT) 0.4 MG SL tablet PLACE ONE TABLET UNDER TONGUE EVERY FIVE MINUTES AS NEEDED FOR CHEST PAIN 100 tablet 3  . oxyCODONE-acetaminophen (PERCOCET) 10-325 MG tablet Take 1 tablet by mouth every 4 (four) hours as needed for pain.    . potassium chloride (K-DUR,KLOR-CON) 10 MEQ tablet Take 1 tablet (10 mEq total) by mouth daily. 30 tablet 0  . ranitidine (ZANTAC) 300 MG tablet take 1 tablet by mouth at bedtime 30 tablet 5  . ranolazine (RANEXA) 500 MG 12 hr tablet Take 1 tablet (500 mg total) by mouth 2 (two) times daily. 60 tablet 3  . sertraline (ZOLOFT) 100 MG tablet Take 100 mg by mouth daily.    . sitaGLIPtin (JANUVIA) 100 MG tablet Take 100 mg by mouth daily.    . tamsulosin (FLOMAX) 0.4 MG CAPS capsule Take 1 capsule (0.4 mg total) by mouth daily after breakfast. 30 capsule 0   No current facility-administered medications for this visit.      Past Surgical History:  Procedure Laterality Date  . ANTERIOR CERVICAL DECOMP/DISCECTOMY FUSION N/A 11/05/2016   Procedure: Anterior Cervical Discectomy and Fusion - Cervical three-Cervical four;  Surgeon: Earnie Larsson, MD;  Location: [facility];  Service: Neurosurgery;  Laterality: N/A;  . BACK SURGERY     x3  . BIOPSY N/A 05/30/2012   Procedure: BIOPSY;  Surgeon: Danie Binder, MD;  [facility];  Service: Endoscopy;  Laterality: N/A;  . BIOPSY  03/02/2016    Procedure: BIOPSY;  Surgeon: Danie Binder, MD;  [facility];  Service: Endoscopy;;  gastric  . CARDIAC CATHETERIZATION  "several"  . CARPAL TUNNEL RELEASE Bilateral   . CATARACT EXTRACTION W/PHACO Right 06/15/2016   Procedure: CATARACT EXTRACTION PHACO AND INTRAOCULAR LENS PLACEMENT (IOC);  Surgeon: Rutherford Guys, MD;  Location: [facility];  Service: Ophthalmology;  Laterality: Right;  CDE: 4.64  .  CATARACT EXTRACTION W/PHACO Left 07/13/2016   Procedure: CATARACT EXTRACTION PHACO AND INTRAOCULAR LENS PLACEMENT (IOC);  Surgeon: Rutherford Guys, MD;  Location: [facility];  Service: Ophthalmology;  Laterality: Left;  CDE: 3.15  . COLONOSCOPY  12/28/2010   SLF: (MAC)Internal hemorrhoids/four small colon polyps tubular adenomas. per SLF: colonoscopy 2022  . COLONOSCOPY WITH PROPOFOL N/A 07/05/2017   Procedure: COLONOSCOPY WITH PROPOFOL;  Surgeon: Danie Binder, MD;  [facility];  Service: Endoscopy;  Laterality: N/A;  7:30am  . CORONARY ARTERY BYPASS GRAFT  2002   3 vessels  . ESOPHAGOGASTRODUODENOSCOPY N/A 05/30/2012   SLF: UNCONTROLLED GERD DUE TO LIFESTYLE CHOICE/WEIGHT GAIN/MILD Non-erosive gastritis  . ESOPHAGOGASTRODUODENOSCOPY (EGD) WITH PROPOFOL N/A 03/02/2016   Dr. Oneida Alar: Esophagus appeared normal, impaired dilation performed, patchy inflammation with edema and erythema of the entire stomach., Biopsy with H pylori, patient completed Pylera.   [name] Kitchen FRACTURE SURGERY    . LEFT HEART CATHETERIZATION WITH CORONARY ANGIOGRAM N/A 08/31/2011   Procedure: LEFT HEART CATHETERIZATION WITH CORONARY ANGIOGRAM;  Surgeon: Laverda Page, MD;  Location[facility] CATH LAB;  Service: Cardiovascular;  Laterality: N/A;  . LUMBAR DISC SURGERY     "L4-5; Dr. Trenton Gammon"  . MULTIPLE TOOTH EXTRACTIONS    . ORIF MANDIBULAR FRACTURE N/A 12/19/2015   Procedure: OPEN REDUCTION INTERNAL FIXATION (ORIF) MANDIBULAR FRACTURE;  Surgeon: Izora Gala, MD;  Location: [facility];  Service: ENT;  Laterality: N/A;  . PATELLA  FRACTURE SURGERY Left 1976   plate to knee cap from accident  . POLYPECTOMY  07/05/2017   Procedure: POLYPECTOMY;  Surgeon: Danie Binder, MD;  [facility];  Service: Endoscopy;;  colon  . SAVORY DILATION  12/28/2010   SLF:(MAC)J-shaped stomach/nodular mocosa in the distal esophagus/empiric dilation 4m  . SAVORY DILATION N/A 03/02/2016   Procedure: SAVORY DILATION;  Surgeon: SDanie Binder MD;  [facility];  Service: Endoscopy;  Laterality: N/A;     Allergies  Allergen Reactions  . Gabapentin Hives  . Ibuprofen Other (See Comments)    Stomach  Upset and stomach ulcers  . Zolpidem Tartrate Other (See Comments)    Hallucinations   . Naproxen Other (See Comments)    HALLUCINATIONS      Family History  Problem Relation Age of Onset  . Diabetes Mother   . Hypertension Mother   . Heart attack Mother 624 . Hypertension Father   . Diabetes Father   . Heart attack Father 560 . Heart attack Unknown        mother, father, brother, sister all deceased due to MI  . Heart attack Sister   . Heart attack Brother   . Seizures Brother   . Heart failure Other   . Colon cancer Neg Hx   . Liver disease Neg Hx   . Anesthesia problems Neg Hx   . Hypotension Neg Hx   . Malignant hyperthermia Neg Hx   . Pseudochol deficiency Neg Hx   . Colon polyps Neg Hx      Social History Mr. TCottrillreports that he has never smoked. He has never used smokeless tobacco. Mr. TGannreports that he does not drink alcohol.   Review of Systems CONSTITUTIONAL: No weight loss, fever, chills, weakness or fatigue.  HEENT: Eyes: No visual loss, blurred vision, double vision or yellow sclerae.No hearing loss, sneezing, congestion, runny nose or sore throat.  SKIN: No rash or itching.  CARDIOVASCULAR: per hpi RESPIRATORY: No shortness of breath, cough or sputum.  GASTROINTESTINAL:  No anorexia, nausea, vomiting or diarrhea. No abdominal pain or blood.  GENITOURINARY: No burning  on urination, no polyuria NEUROLOGICAL: No headache, dizziness, syncope, paralysis, ataxia, numbness or tingling in the extremities. No change in bowel or bladder control.  MUSCULOSKELETAL: No muscle, back pain, joint pain or stiffness.  LYMPHATICS: No enlarged nodes. No history of splenectomy.  PSYCHIATRIC: No history of depression or anxiety.  ENDOCRINOLOGIC: No reports of sweating, cold or heat intolerance. No polyuria or polydipsia.  [name] Kitchen   Physical Examination Vitals:   12/16/17 1438  BP: 102/70  Pulse: 76  SpO2: 98%   Vitals:   12/16/17 1438  Weight: 269 lb (122 kg)  Height: _0  (1.778 m)    Gen: resting comfortably, no acute distress HEENT: no scleral icterus, pupils equal round and reactive, no palptable cervical adenopathy,  CV: RRR, no m/r/g, no jvd Resp: Clear to auscultation bilaterally GI: abdomen is soft, non-tender, non-distended, normal bowel sounds, no hepatosplenomegaly MSK: extremities are warm, trace bilateral edema Skin: warm, no rash Neuro:  no focal deficits Psych: appropriate affect   Diagnostic Studies  06/2017 echo Study Conclusions  - Left ventricle: The cavity size was normal. Wall thickness was   increased in a pattern of mild LVH. Systolic function was normal.   The estimated ejection fraction was in the range of 60% to 65%.  12/2015 nuclear stress IMPRESSION: 1. No reversible ischemia or infarction.  2. Normal left ventricular wall motion.  3. Left ventricular ejection fraction is 54%.  4. Non invasive risk stratification*: Low   LHC (08/31/11, Dr. Einar Gip): LMCA normal. LAD normal. LAD normal. RCA tortuous but otherwise normal. LVEF 55-60%. LVEDP 19 mmHg. Patent left subclavian artery stent with atretic LIMA.  LHC (10/24/08): LMCA normal. LAD with midvessel bridge and 40% stenosis in systole. Ostial LAD with 30% stenosis. LCx with mild luminal irregularities. RCA with mild luminal irregularities. Patent left subclavian artery stent.  LIMA -> LAD proximally occluded.   Assessment and Plan  1. CAD with other forms of angina - chest pain resolved on ranexa, however reports significant fatigue - d/c ranexa, follow symptoms. If recurrence chest pain, may have to lower his lisinopril dose and try norvasc. He had headaches on imdur in the past.   2. Hyperlipidemia - repeat lipid panel and liver tests. Reports occasional rash that he is not sure if is related to repatha, reports significant allergies in general. Continue to monitor at this time  3. Chronic heart failure with preserved EF - some evidence of extra fluid, Im not convinced all his recent weight gain is fluid - increase lasix to 79m daily, check kidney function and K/Mg next week  4. HTN - at goal, continue current meds   Dalton [name] M.D.

## 2017-12-26 ENCOUNTER — Other Ambulatory Visit: Payer: Self-pay | Admitting: Cardiology

## 2017-12-27 LAB — COMPLETE METABOLIC PANEL WITH GFR
AG Ratio: 1.6 (calc) (ref 1.0–2.5)
ALKALINE PHOSPHATASE (APISO): 106 U/L (ref 40–115)
ALT: 25 U/L (ref 9–46)
AST: 24 U/L (ref 10–35)
Albumin: 4.2 g/dL (ref 3.6–5.1)
BUN: 8 mg/dL (ref 7–25)
CALCIUM: 9.2 mg/dL (ref 8.6–10.3)
CO2: 30 mmol/L (ref 20–32)
CREATININE: 0.91 mg/dL (ref 0.70–1.33)
Chloride: 104 mmol/L (ref 98–110)
GFR, EST NON AFRICAN AMERICAN: 94 mL/min/{1.73_m2} (ref >=60)
GFR, Est African American: 109 mL/min/{1.73_m2} (ref >=60)
GLUCOSE: 99 mg/dL (ref 65–99)
Globulin: 2.6 g/dL (calc) (ref 1.9–3.7)
Potassium: 3.8 mmol/L (ref 3.5–5.3)
Sodium: 139 mmol/L (ref 135–146)
Total Bilirubin: 0.4 mg/dL (ref 0.2–1.2)
Total Protein: 6.8 g/dL (ref 6.1–8.1)

## 2017-12-27 LAB — LIPID PANEL
CHOL/HDL RATIO: 2 (calc) (ref <5.0)
CHOLESTEROL: 72 mg/dL (ref <200)
HDL: 36 mg/dL — ABNORMAL LOW (ref >40)
LDL CHOLESTEROL (CALC): 10 mg/dL
NON-HDL CHOLESTEROL (CALC): 36 mg/dL (ref <130)
Triglycerides: 193 mg/dL — ABNORMAL HIGH (ref <150)

## 2017-12-27 LAB — MAGNESIUM: MAGNESIUM: 2.3 mg/dL (ref 1.5–2.5)

## 2018-01-07 ENCOUNTER — Other Ambulatory Visit: Payer: Self-pay | Admitting: Internal Medicine

## 2018-01-09 NOTE — Telephone Encounter (Signed)
Please review for refill. Thanks!  

## 2018-02-07 ENCOUNTER — Other Ambulatory Visit: Payer: Self-pay | Admitting: Internal Medicine

## 2018-02-07 NOTE — Telephone Encounter (Signed)
Refill Request.  

## 2018-03-06 ENCOUNTER — Encounter: Payer: Self-pay | Admitting: Nurse Practitioner

## 2018-03-06 ENCOUNTER — Ambulatory Visit (INDEPENDENT_AMBULATORY_CARE_PROVIDER_SITE_OTHER): Payer: Medicare Other | Admitting: Nurse Practitioner

## 2018-03-06 VITALS — BP 128/80 | HR 78 | Temp 97.9°F | Ht 70.0 in | Wt 253.8 lb

## 2018-03-06 DIAGNOSIS — R1030 Lower abdominal pain, unspecified: Secondary | ICD-10-CM | POA: Diagnosis not present

## 2018-03-06 DIAGNOSIS — K5900 Constipation, unspecified: Secondary | ICD-10-CM

## 2018-03-06 DIAGNOSIS — K219 Gastro-esophageal reflux disease without esophagitis: Secondary | ICD-10-CM | POA: Diagnosis not present

## 2018-03-06 NOTE — Assessment & Plan Note (Signed)
The patient has chronic constipation.  We previously tried him on Amitiza.  He states this did not help.  At this point I was planning to start him on Linzess 145 mcg with samples, however we are out of samples at that dose.  Instead I will start him on Linzess 72 mcg for 1 to 2 weeks.  Request progress report in 1 to 2 weeks.  We can increase his dose based on his response.  Return for follow-up in 3 months.

## 2018-03-06 NOTE — Progress Notes (Signed)
cc'd to pcp 

## 2018-03-06 NOTE — Assessment & Plan Note (Signed)
GERD has historically been well controlled on Dexilant.  He did have some worsening GERD symptoms when he started a new medication (Repatha).  Rather than a knee-jerk reaction to switch his medications, I will keep him on Dexilant for now.  Follow-up in 3 months and if he is still having symptoms we can discuss other possible treatments.  Follow-up in 3 months.

## 2018-03-06 NOTE — Patient Instructions (Signed)
1. Continue taking Dexilant for your heartburn symptoms.  We will re-look at this in 3 months. 2. Stop taking Amitiza.  Start taking Linzess 72 mcg.  Take this once a day on an empty stomach. 3. I am giving you samples to last 1 to 2 weeks.  Call us in 1 to 2 weeks and let us know if it is helping her constipation. 4. Return for follow-up in 3 months. 5. Call us if you have any questions or concerns.  At [facility] we value your feedback. You may receive a survey about your visit today. Please share your experience as we strive to create trusting relationships with our patients to provide genuine, compassionate, quality care.  We appreciate your understanding and patience as we review any laboratory studies, imaging, and other diagnostic tests that are ordered as we care for you. Our office policy is 5 business days for review of these results, and any emergent or urgent results are addressed in a timely manner for your best interest. If you do not hear from our office in 1 week, please contact us.   We also encourage the use of MyChart, which contains your medical information for your review as well. If you are not enrolled in this feature, an access code is on this after visit summary for your convenience. Thank you for allowing [place] to be involved in your care.  It was great to see you today!  I hope you have a great day!!

## 2018-03-06 NOTE — Progress Notes (Signed)
Referring Provider: Nolene Ebbs, MD Primary Care Physician:  Nolene Ebbs, MD Primary GI:  Dr. Oneida Alar  Chief Complaint  Patient presents with  . Constipation    HPI:   Dalton [name] is a 58 y.o. male who presents for follow-up on GERD and constipation.  The patient was last seen in our office 11/29/2017 for the same as well as lower abdominal pain.  Known history of tubular adenoma on colonoscopy.  Colonoscopy recently updated 07/05/2017 which found rectal bleeding due to large internal hemorrhoids, 2 tubular adenomas and a submucosal nodule found to be benign lipoma.  Due for repeat in 3 years (2020).  At his last visit he was having some intermittent lower abdominal discomfort a couple times a week with constipation and stool pattern of a bowel movement every 1 to 3 days with straining, small hard stools.  Taking MiraLAX and beet juice to help which does help some.  Will use mag citrate as needed.  GERD doing well on Dexilant.  No other GI symptoms.  Recommended he start Amitiza twice a day, samples were provided.  Follow-up in 3 months.  Today states he's doing ok overall. He states Amitiza didn't help. He did not call us because he doesn't want to bother our office. Has been using Mag Citrate as needed. Has ongoing constipation, makes an effort to eat salads and other fiber foods. Drinks as much water as he can. He has not tried Linzess before. Still with lower abdominal pain likely due to constipation. Intermittent nausea, no vomiting. Denies hematochezia. Has intermittent dark stools, not on iron or Pepto Bismol. Denies fever, chills, unintentional weight loss. GERD doing "up and down" on Dexilant. Recently started on Repatha at which point he started having intermittent GERD symptoms. Intermittent chest pain with significant cardiac history, is followed by Cardiology. Denies dyspnea, dizziness, lightheadedness, syncope, near syncope. Denies any other upper or lower GI  symptoms.  Past Medical History:  Diagnosis Date  . Anemia   . Anxiety   . Bipolar disorder ([place])   . CHF (congestive heart failure) ([place])   . Chronic back pain    Pain Clinic in [place]  . Chronic bronchitis   . Chronic lower back pain   . Congestive heart failure (CHF) ([place])   . Coronary artery disease   . Coughing   . Depression   . DVT (deep venous thrombosis) ([place]) ~ 2005   LLE  . Frequency of urination   . GERD (gastroesophageal reflux disease)   . 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 ZOXWRUEA(540.9)    "a few times/week" (12/24/2015)  . High cholesterol   . HNP (herniated nucleus pulposus), cervical   . Hypertension   . Laceration of right hand 11/27/2010  . Laceration of wrist 2007 BIL FOREARMS  . MI (myocardial infarction) ([place])    7, last one was in 2011 (12/24/2015)  . Neuropathy   . NSAID-induced gastric ulcer    "Ibuprofen"  . PUD (peptic ulcer disease)    in 1990s, secondary to medication  . Rheumatoid arthritis ([place])    "all over" (12/24/2015)  . Shortness of breath    with exertion  . Tonsillitis, chronic    Dr. Vicki Mallet in [place]  . Type II diabetes mellitus ([place])   . Wears glasses     Past Surgical History:  Procedure Laterality Date  . ANTERIOR CERVICAL DECOMP/DISCECTOMY FUSION N/A 11/05/2016  Procedure: Anterior Cervical Discectomy and Fusion - Cervical three-Cervical four;  Surgeon: Earnie Larsson, MD;  Location: [place];  Service: Neurosurgery;  Laterality: N/A;  . BACK SURGERY     x3  . BIOPSY N/A 05/30/2012   Procedure: BIOPSY;  Surgeon: Danie Binder, MD;  Location: [facility];  Service: Endoscopy;  Laterality: N/A;  . BIOPSY  03/02/2016   Procedure: BIOPSY;  Surgeon: Danie Binder, MD;  [facility];  Service: Endoscopy;;  gastric  . CARDIAC CATHETERIZATION  "several"  . CARPAL TUNNEL RELEASE Bilateral   . CATARACT EXTRACTION W/PHACO Right 06/15/2016    Procedure: CATARACT EXTRACTION PHACO AND INTRAOCULAR LENS PLACEMENT (IOC);  Surgeon: Rutherford Guys, MD;  Location: [facility];  Service: Ophthalmology;  Laterality: Right;  CDE: 4.64  . CATARACT EXTRACTION W/PHACO Left 07/13/2016   Procedure: CATARACT EXTRACTION PHACO AND INTRAOCULAR LENS PLACEMENT (IOC);  Surgeon: Rutherford Guys, MD;  Location: [facility];  Service: Ophthalmology;  Laterality: Left;  CDE: 3.15  . COLONOSCOPY  12/28/2010   SLF: (MAC)Internal hemorrhoids/four small colon polyps tubular adenomas. per SLF: colonoscopy 2022  . COLONOSCOPY WITH PROPOFOL N/A 07/05/2017   Procedure: COLONOSCOPY WITH PROPOFOL;  Surgeon: Danie Binder, MD;  [facility];  Service: Endoscopy;  Laterality: N/A;  7:30am  . CORONARY ARTERY BYPASS GRAFT  2002   3 vessels  . ESOPHAGOGASTRODUODENOSCOPY N/A 05/30/2012   SLF: UNCONTROLLED GERD DUE TO LIFESTYLE CHOICE/WEIGHT GAIN/MILD Non-erosive gastritis  . ESOPHAGOGASTRODUODENOSCOPY (EGD) WITH PROPOFOL N/A 03/02/2016   Dr. Oneida Alar: Esophagus appeared normal, impaired dilation performed, patchy inflammation with edema and erythema of the entire stomach., Biopsy with H pylori, patient completed Pylera.   [name] Kitchen FRACTURE SURGERY    . LEFT HEART CATHETERIZATION WITH CORONARY ANGIOGRAM N/A 08/31/2011   Procedure: LEFT HEART CATHETERIZATION WITH CORONARY ANGIOGRAM;  Surgeon: Laverda Page, MD;  Location: [facility] CATH LAB;  Service: Cardiovascular;  Laterality: N/A;  . LUMBAR DISC SURGERY     "L4-5; Dr. Trenton Gammon"  . MULTIPLE TOOTH EXTRACTIONS    . ORIF MANDIBULAR FRACTURE N/A 12/19/2015   Procedure: OPEN REDUCTION INTERNAL FIXATION (ORIF) MANDIBULAR FRACTURE;  Surgeon: Izora Gala, MD;  Location: [place];  Service: ENT;  Laterality: N/A;  . PATELLA FRACTURE SURGERY Left 1976   plate to knee cap from accident  . POLYPECTOMY  07/05/2017   Procedure: POLYPECTOMY;  Surgeon: Danie Binder, MD;  [facility];  Service: Endoscopy;;  colon  . SAVORY DILATION  12/28/2010    SLF:(MAC)J-shaped stomach/nodular mocosa in the distal esophagus/empiric dilation 28m  . SAVORY DILATION N/A 03/02/2016   Procedure: SAVORY DILATION;  Surgeon: SDanie Binder MD;  [facility];  Service: Endoscopy;  Laterality: N/A;    Current Outpatient Medications  Medication Sig Dispense Refill  . albuterol (PROVENTIL HFA;VENTOLIN HFA) 108 (90 Base) MCG/ACT inhaler Inhale 2 puffs into the lungs every 6 (six) hours as needed for wheezing or shortness of breath.     . alprazolam (XANAX) 2 MG tablet Take 2 mg by mouth 2 (two) times daily.    .[name] Kitchenaspirin EC 81 MG tablet Take 81 mg by mouth at bedtime.    .[name] Kitchenatorvastatin (LIPITOR) 80 MG tablet Take 1 tablet (80 mg total) by mouth daily. 90 tablet 3  . beclomethasone (QVAR) 80 MCG/ACT inhaler Inhale 2 puffs into the lungs 2 (two) times daily.    . cetirizine (ZYRTEC) 10 MG tablet Take 10 mg by mouth daily as needed for allergies.   0  .  dexlansoprazole (DEXILANT) 60 MG capsule Take 1 capsule (60 mg total) by mouth daily. 30 capsule 5  . Evolocumab (REPATHA SURECLICK) 297 MG/ML SOAJ Inject 140 mg into the skin every 14 (fourteen) days. 2 pen 11  . ezetimibe (ZETIA) 10 MG tablet Take 1 tablet (10 mg total) by mouth daily. 90 tablet 3  . fluticasone (FLONASE) 50 MCG/ACT nasal spray Place 2 sprays into both nostrils daily as needed for allergies.     . furosemide (LASIX) 40 MG tablet Take 1.5 tablets (60 mg total) by mouth daily. 135 tablet 1  . glipiZIDE (GLUCOTROL) 10 MG tablet Take 10 mg by mouth daily before breakfast.    . ipratropium (ATROVENT) 0.02 % nebulizer solution Take 0.5 mg by nebulization 2 (two) times daily as needed (for congestion).    [name] Kitchen lisinopril (PRINIVIL,ZESTRIL) 10 MG tablet Take 10 mg by mouth daily.    [name] Kitchen LYRICA 100 MG capsule Take 100 mg by mouth 2 (two) times daily as needed (pain).   0  . meclizine (ANTIVERT) 25 MG tablet Take 25 mg by mouth 3 (three) times daily as needed for dizziness.    . metFORMIN (GLUCOPHAGE)  500 MG tablet Take 1 tablet (500 mg total) by mouth daily with breakfast. (Patient taking differently: Take 500 mg by mouth 2 (two) times daily with a meal. ) 30 tablet 0  . metoprolol tartrate (LOPRESSOR) 25 MG tablet Take 0.5 tablets (12.5 mg total) by mouth 2 (two) times daily. 30 tablet 0  . NARCAN 4 MG/0.1ML LIQD nasal spray kit Place 1 spray into the nose once.   0  . nitroGLYCERIN (NITROSTAT) 0.4 MG SL tablet Place 1 tablet (0.4 mg total) under the tongue every 5 (five) minutes x 3 doses as needed for chest pain (If no relief after 3rd dose, proceed to the ED for an evaluation). 100 tablet 3  . oxyCODONE-acetaminophen (PERCOCET) 10-325 MG tablet Take 1 tablet by mouth every 4 (four) hours as needed for pain.    . potassium chloride (K-DUR,KLOR-CON) 10 MEQ tablet Take 1 tablet (10 mEq total) by mouth daily. 30 tablet 0  . ranitidine (ZANTAC) 300 MG tablet take 1 tablet by mouth at bedtime 30 tablet 5  . sertraline (ZOLOFT) 100 MG tablet Take 100 mg by mouth daily.    . sitaGLIPtin (JANUVIA) 100 MG tablet Take 100 mg by mouth daily.    . tamsulosin (FLOMAX) 0.4 MG CAPS capsule Take 1 capsule (0.4 mg total) by mouth daily after breakfast. 30 capsule 0   No current facility-administered medications for this visit.     Allergies as of 03/06/2018 - Review Complete 03/06/2018  Allergen Reaction Noted  . Gabapentin Hives   . Ibuprofen Other (See Comments)   . Zolpidem tartrate Other (See Comments)   . Naproxen Other (See Comments)     Family History  Problem Relation Age of Onset  . Diabetes Mother   . Hypertension Mother   . Heart attack Mother 35  . Hypertension Father   . Diabetes Father   . Heart attack Father 65  . Heart attack Other        mother, father, brother, sister all deceased due to MI  . Heart attack Sister   . Heart attack Brother   . Seizures Brother   . Heart failure Other   . Colon cancer Neg Hx   . Liver disease Neg Hx   . Anesthesia problems Neg Hx   .  Hypotension Neg Hx   .  Malignant hyperthermia Neg Hx   . Pseudochol deficiency Neg Hx   . Colon polyps Neg Hx     Social History   Socioeconomic History  . Marital status: Single    Spouse name: Not on file  . Number of children: 2  . Years of education: Not on file  . Highest education level: Not on file  Occupational History  . Occupation: disabled    Fish farm manager: NOT EMPLOYED  Social Needs  . Financial resource strain: Not on file  . Food insecurity:    Worry: Not on file    Inability: Not on file  . Transportation needs:    Medical: Not on file    Non-medical: Not on file  Tobacco Use  . Smoking status: Never Smoker  . Smokeless tobacco: Never Used  Substance and Sexual Activity  . Alcohol use: No    Alcohol/week: 0.0 standard drinks  . 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 Sexual activity: Not Currently  Lifestyle  . Physical activity:    Days per week: Not on file    Minutes per session: Not on file  . Stress: Not on file  Relationships  . Social connections:    Talks on phone: Not on file    Gets together: Not on file    Attends religious service: Not on file    Active member of club or organization: Not on file    Attends meetings of clubs or organizations: Not on file    Relationship status: Not on file  Other Topics Concern  . Not on file  Social History Narrative   Lives w/ son-23/22    Review of Systems: Complete ROS negative except as per HPI.   Physical Exam: BP 128/80   Pulse 78   Temp 97.9 F (36.6 C) (Oral)   Ht _0  (1.778 m)   Wt 253 lb 12.8 oz (115.1 kg)   BMI 36.42 kg/m  General:   Alert and oriented. Pleasant and cooperative. Well-nourished and well-developed.  Eyes:  Without icterus, sclera clear and conjunctiva pink.  Ears:  Normal auditory acuity. Cardiovascular:  S1, S2 present without murmurs appreciated. Extremities without clubbing or  edema. Respiratory:  Clear to auscultation bilaterally. No wheezes, rales, or rhonchi. No distress.  Gastrointestinal:  +BS, soft, non-tender and non-distended. No HSM noted. No guarding or rebound. No masses appreciated.  Rectal:  Deferred  Musculoskalatal:  Symmetrical without gross deformities. Neurologic:  Alert and oriented x4;  grossly normal neurologically. Psych:  Alert and cooperative. Normal mood and affect. Heme/Lymph/Immune: No excessive bruising noted.    03/06/2018 9:08 AM   Disclaimer: This note was dictated with voice recognition software. Similar sounding words can inadvertently be transcribed and may not be corrected upon review.

## 2018-03-06 NOTE — Assessment & Plan Note (Signed)
Persistent but intermittent lower abdominal pain most likely due to constipation.  His colonoscopy is up-to-date but he will be due again this year.  Plan to treat constipation further as per above.  Follow-up in 3 months.

## 2018-03-22 ENCOUNTER — Telehealth: Payer: Self-pay

## 2018-03-22 NOTE — Telephone Encounter (Signed)
Wait for Determination Please wait for [employer] 2017 NCPDP to return a determination.

## 2018-03-22 NOTE — Telephone Encounter (Signed)
Dalton [name] (Key: [id number])  Repatha SureClick 140MG /ML auto-injectors   [employer] is processing your PA request and will respond shortly with next steps. You may close this dialog, return to your dashboard, and perform other tasks. To check for an update later, open this request again from your dashboard. If you need assistance, please chat with CoverMyMeds or call us at [phone].

## 2018-03-23 ENCOUNTER — Other Ambulatory Visit: Payer: Self-pay

## 2018-03-23 ENCOUNTER — Telehealth: Payer: Self-pay

## 2018-03-23 MED ORDER — EVOLOCUMAB 140 MG/ML ~~LOC~~ SOAJ: 140.0000 mg | SUBCUTANEOUS | 11 refills | Status: DC

## 2018-03-23 NOTE — Telephone Encounter (Signed)
Called to let the pt know that the rx was sent and that the copay for the repatha will be 3.90

## 2018-03-23 NOTE — Telephone Encounter (Signed)
Dalton [name] (Key: [id number])  Repatha SureClick 140MG /ML auto-injectors  Prior Auth approved through 02/22/2019 Patient aware of approval as well as pharmacy.

## 2018-04-17 ENCOUNTER — Telehealth: Payer: Self-pay

## 2018-04-17 DIAGNOSIS — K5900 Constipation, unspecified: Secondary | ICD-10-CM

## 2018-04-17 NOTE — Telephone Encounter (Signed)
VM received, pt stated his acid reflux wasn't helping. Returned call. Pt is taking Linzess 72 mcg daily. I explained to pt that this is to help his bowels move. Pt said the Linzess 72 mcg isn't helping well and he has to take a laxative to help his bowels move. Pt would like the Linzess 72 mcg increased. Pt was previously taking a Dexilant sample and will need that called in as well or something to control the reflux.

## 2018-04-18 NOTE — Telephone Encounter (Signed)
EG, I spoke with pt. He is taking the Linzess 20mcg daily and actually admitted to doubling up on x 5 days to equal 145 mcg daily. He has been taking Linzess 145 mcg x 5 days with no improvement. Pt drunk some room temperature milk to help him have a bowel movement. Do you want pt to increase to the Linzess 290 mcg. Pt is running out of samples and will need a Rx.  Pt also needs Rx Dexilant sent to his pharmacy for the [place].

## 2018-04-18 NOTE — Telephone Encounter (Signed)
Lets have him try taking two of the 72 mcg Linzess (effective dose 145 mcg) once daily and see if this helps. Call with progress report in 1-2 weeks.

## 2018-04-19 ENCOUNTER — Ambulatory Visit: Payer: Medicare Other | Admitting: Cardiology

## 2018-04-19 NOTE — Progress Notes (Deleted)
Clinical Summary Dalton [name] is a 58 y.o.male  1. CAD with stable angina - history of CABG LIMA-LAD - last cath showed atretic LIMA, normal coronaries  - imdur lowered to 96m daily due to headaches and later stopped - started on ranexa 5038mbid at 08/2017 visit with Dr End. Antianginal therapy somewhat limited due to soft bp's - since starting ranexa has had some fatigue, but chest pain improved.    - last visit stopped ranexa to see if associated with his fatigue.      2. Chronic heart failure with preserved EF - 06/2017 echo LVEF 6005-39%diastolic function not reported but normal parameters - some recent edema. Wears compression stockings. Limiting sodium intake.  - weight gain, by our scales up 12 lbs from earlier this month.  - last visit increased lasix to 6062maily, labs have been stable    3. HTN - compliant with meds   4. Hyperlipidemia - started repatha and zetia 09/2017, needs repeat lipids and hepatic panel   12/2017 TC 72 HDL 36 TG 193 LDL 10     5. GERD - followed by GI Past Medical History:  Diagnosis Date  . Anemia   . Anxiety   . Bipolar disorder ([facility] . CHF (congestive heart failure) ([place] . Chronic back pain    [place]   . Chronic lower back pain   . Congestive heart failure (CHF) ([place] . Coronary artery disease   . Coughing   . Depression   . D[place] venous thrombosis) ([place] 2005   LLE  . Frequency of urination   . GERD (gastroesophageal reflux disease)   . 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  [facility], last seizure in 2011;unknown etiology-pt sts heriditary (12/24/2015)  . HeaJQBHALPF(790.2  "a few times/week" (12/24/2015)  . High cholesterol   . HNP (herniated nucleus pulposus), cervical   . Hypertension   . Laceration of right hand 11/27/2010  . Laceration of wrist 2007 BIL FOREARMS  . MI (myocardial infarction) ([place]CC[place]  7, last one was in 2011 (12/24/2015)  .  Neuropathy   . NSAID-induced gastric ulcer    "Ibuprofen"  . PUD (peptic ulcer disease)    in 1990s, secondary to medication  . Rheumatoid arthritis ([facility]  "all over" (12/24/2015)  . Shortness of breath    with exertion  . Tonsillitis, chronic    Dr. SteVicki Mallet [place] Type II diabetes mellitus ([place] . Wears glasses      Allergies  Allergen Reactions  . Gabapentin Hives  . Ibuprofen Other (See Comments)    Stomach  Upset and stomach ulcers  . Zolpidem Tartrate Other (See Comments)    Hallucinations   . Naproxen Other (See Comments)    HALLUCINATIONS     Current Outpatient Medications  Medication Sig Dispense Refill  . albuterol (PROVENTIL HFA;VENTOLIN HFA) 108 (90 Base) MCG/ACT inhaler Inhale 2 puffs into the lungs every 6 (six) hours as needed for wheezing or shortness of breath.     . alprazolam (XANAX) 2 MG tablet Take 2 mg by mouth 2 (two) times daily.    . aMarland Kitchenpirin EC 81 MG tablet Take 81 mg by mouth at bedtime.    . aMarland Kitchenorvastatin (LIPITOR) 80 MG tablet Take 1 tablet (80 mg total) by mouth daily. 90 tablet 3  . beclomethasone (QVAR) 80 MCG/ACT inhaler  Inhale 2 puffs into the lungs 2 (two) times daily.    . cetirizine (ZYRTEC) 10 MG tablet Take 10 mg by mouth daily as needed for allergies.   0  . dexlansoprazole (DEXILANT) 60 MG capsule Take 1 capsule (60 mg total) by mouth daily. 30 capsule 5  . Evolocumab (REPATHA SURECLICK) 672 MG/ML SOAJ Inject 140 mg into the skin every 14 (fourteen) days. 2 pen 11  . ezetimibe (ZETIA) 10 MG tablet Take 1 tablet (10 mg total) by mouth daily. 90 tablet 3  . fluticasone (FLONASE) 50 MCG/ACT nasal spray Place 2 sprays into both nostrils daily as needed for allergies.     . furosemide (LASIX) 40 MG tablet Take 1.5 tablets (60 mg total) by mouth daily. 135 tablet 1  . glipiZIDE (GLUCOTROL) 10 MG tablet Take 10 mg by mouth daily before breakfast.    . ipratropium (ATROVENT) 0.02 % nebulizer solution Take 0.5 mg by nebulization  2 (two) times daily as needed (for congestion).    [name] Kitchen lisinopril (PRINIVIL,ZESTRIL) 10 MG tablet Take 10 mg by mouth daily.    [name] Kitchen LYRICA 100 MG capsule Take 100 mg by mouth 2 (two) times daily as needed (pain).   0  . meclizine (ANTIVERT) 25 MG tablet Take 25 mg by mouth 3 (three) times daily as needed for dizziness.    . metFORMIN (GLUCOPHAGE) 500 MG tablet Take 1 tablet (500 mg total) by mouth daily with breakfast. (Patient taking differently: Take 500 mg by mouth 2 (two) times daily with a meal. ) 30 tablet 0  . metoprolol tartrate (LOPRESSOR) 25 MG tablet Take 0.5 tablets (12.5 mg total) by mouth 2 (two) times daily. 30 tablet 0  . NARCAN 4 MG/0.1ML LIQD nasal spray kit Place 1 spray into the nose once.   0  . nitroGLYCERIN (NITROSTAT) 0.4 MG SL tablet Place 1 tablet (0.4 mg total) under the tongue every 5 (five) minutes x 3 doses as needed for chest pain (If no relief after 3rd dose, proceed to the ED for an evaluation). 100 tablet 3  . oxyCODONE-acetaminophen (PERCOCET) 10-325 MG tablet Take 1 tablet by mouth every 4 (four) hours as needed for pain.    . potassium chloride (K-DUR,KLOR-CON) 10 MEQ tablet Take 1 tablet (10 mEq total) by mouth daily. 30 tablet 0  . ranitidine (ZANTAC) 300 MG tablet take 1 tablet by mouth at bedtime 30 tablet 5  . sertraline (ZOLOFT) 100 MG tablet Take 100 mg by mouth daily.    . sitaGLIPtin (JANUVIA) 100 MG tablet Take 100 mg by mouth daily.    . tamsulosin (FLOMAX) 0.4 MG CAPS capsule Take 1 capsule (0.4 mg total) by mouth daily after breakfast. 30 capsule 0   No current facility-administered medications for this visit.      Past Surgical History:  Procedure Laterality Date  . ANTERIOR CERVICAL DECOMP/DISCECTOMY FUSION N/A 11/05/2016   Procedure: Anterior Cervical Discectomy and Fusion - Cervical three-Cervical four;  Surgeon: Earnie Larsson, MD;  Location: [facility];  Service: Neurosurgery;  Laterality: N/A;  . BACK SURGERY     x3  . BIOPSY N/A 05/30/2012    Procedure: BIOPSY;  Surgeon: Danie Binder, MD;  [facility];  Service: Endoscopy;  Laterality: N/A;  . BIOPSY  03/02/2016   Procedure: BIOPSY;  Surgeon: Danie Binder, MD;  [facility];  Service: Endoscopy;;  gastric  . CARDIAC CATHETERIZATION  "several"  . CARPAL TUNNEL RELEASE Bilateral   . CATARACT EXTRACTION W/PHACO  Right 06/15/2016   Procedure: CATARACT EXTRACTION PHACO AND INTRAOCULAR LENS PLACEMENT (IOC);  Surgeon: Rutherford Guys, MD;  Location: [facility];  Service: Ophthalmology;  Laterality: Right;  CDE: 4.64  . CATARACT EXTRACTION W/PHACO Left 07/13/2016   Procedure: CATARACT EXTRACTION PHACO AND INTRAOCULAR LENS PLACEMENT (IOC);  Surgeon: Rutherford Guys, MD;  [facility];  Service: Ophthalmology;  Laterality: Left;  CDE: 3.15  . COLONOSCOPY  12/28/2010   SLF: (MAC)Internal hemorrhoids/four small colon polyps tubular adenomas. per SLF: colonoscopy 2022  . COLONOSCOPY WITH PROPOFOL N/A 07/05/2017   Procedure: COLONOSCOPY WITH PROPOFOL;  Surgeon: Danie Binder, MD;  [facility];  Service: Endoscopy;  Laterality: N/A;  7:30am  . CORONARY ARTERY BYPASS GRAFT  2002   3 vessels  . ESOPHAGOGASTRODUODENOSCOPY N/A 05/30/2012   SLF: UNCONTROLLED GERD DUE TO LIFESTYLE CHOICE/WEIGHT GAIN/MILD Non-erosive gastritis  . ESOPHAGOGASTRODUODENOSCOPY (EGD) WITH PROPOFOL N/A 03/02/2016   Dr. Oneida Alar: Esophagus appeared normal, impaired dilation performed, patchy inflammation with edema and erythema of the entire stomach., Biopsy with H pylori, patient completed Pylera.   [name] Kitchen FRACTURE SURGERY    . LEFT HEART CATHETERIZATION WITH CORONARY ANGIOGRAM N/A 08/31/2011   Procedure: LEFT HEART CATHETERIZATION WITH CORONARY ANGIOGRAM;  Surgeon: Laverda Page, MD;  Location: [facility];  Service: Cardiovascular;  Laterality: N/A;  . LUMBAR DISC SURGERY     "L4-5; Dr. Trenton Gammon"  . MULTIPLE TOOTH EXTRACTIONS    . ORIF MANDIBULAR FRACTURE N/A 12/19/2015   Procedure: OPEN REDUCTION INTERNAL  FIXATION (ORIF) MANDIBULAR FRACTURE;  Surgeon: Izora Gala, MD;  Location: [facility];  Service: ENT;  Laterality: N/A;  . PATELLA FRACTURE SURGERY Left 1976   plate to knee cap from accident  . POLYPECTOMY  07/05/2017   Procedure: POLYPECTOMY;  Surgeon: Danie Binder, MD;  [facility];  Service: Endoscopy;;  colon  . SAVORY DILATION  12/28/2010   SLF:(MAC)J-shaped stomach/nodular mocosa in the distal esophagus/empiric dilation 20m  . SAVORY DILATION N/A 03/02/2016   Procedure: SAVORY DILATION;  Surgeon: SDanie Binder MD;  [facility];  Service: Endoscopy;  Laterality: N/A;     Allergies  Allergen Reactions  . Gabapentin Hives  . Ibuprofen Other (See Comments)    Stomach  Upset and stomach ulcers  . Zolpidem Tartrate Other (See Comments)    Hallucinations   . Naproxen Other (See Comments)    HALLUCINATIONS      Family History  Problem Relation Age of Onset  . Diabetes Mother   . Hypertension Mother   . Heart attack Mother 641 . Hypertension Father   . Diabetes Father   . Heart attack Father 581 . Heart attack Other        mother, father, brother, sister all deceased due to MI  . Heart attack Sister   . Heart attack Brother   . Seizures Brother   . Heart failure Other   . Colon cancer Neg Hx   . Liver disease Neg Hx   . Anesthesia problems Neg Hx   . Hypotension Neg Hx   . Malignant hyperthermia Neg Hx   . Pseudochol deficiency Neg Hx   . Colon polyps Neg Hx      Social History Mr. TDewanreports that he has never smoked. He has never used smokeless tobacco. Mr. TCrisostomoreports no history of alcohol use.   Review of Systems CONSTITUTIONAL: No weight loss, fever, chills, weakness or fatigue.  HEENT: Eyes: No visual loss, blurred vision, double vision  or yellow sclerae.No hearing loss, sneezing, congestion, runny nose or sore throat.  SKIN: No rash or itching.  CARDIOVASCULAR:  RESPIRATORY: No shortness of breath, cough or sputum.    GASTROINTESTINAL: No anorexia, nausea, vomiting or diarrhea. No abdominal pain or blood.  GENITOURINARY: No burning on urination, no polyuria NEUROLOGICAL: No headache, dizziness, syncope, paralysis, ataxia, numbness or tingling in the extremities. No change in bowel or bladder control.  MUSCULOSKELETAL: No muscle, back pain, joint pain or stiffness.  LYMPHATICS: No enlarged nodes. No history of splenectomy.  PSYCHIATRIC: No history of depression or anxiety.  ENDOCRINOLOGIC: No reports of sweating, cold or heat intolerance. No polyuria or polydipsia.  [name] Kitchen   Physical Examination There were no vitals filed for this visit. There were no vitals filed for this visit.  Gen: resting comfortably, no acute distress HEENT: no scleral icterus, pupils equal round and reactive, no palptable cervical adenopathy,  CV Resp: Clear to auscultation bilaterally GI: abdomen is soft, non-tender, non-distended, normal bowel sounds, no hepatosplenomegaly MSK: extremities are warm, no edema.  Skin: warm, no rash Neuro:  no focal deficits Psych: appropriate affect   Diagnostic Studies 06/2017 echo Study Conclusions  - Left ventricle: The cavity size was normal. Wall thickness was increased in a pattern of mild LVH. Systolic function was normal. The estimated ejection fraction was in the range of 60% to 65%.  12/2015 nuclear stress IMPRESSION: 1. No reversible ischemia or infarction.  2. Normal left ventricular wall motion.  3. Left ventricular ejection fraction is 54%.  4. Non invasive risk stratification*: Low   LHC (08/31/11, Dr. Einar Gip): LMCA normal. LAD normal. LAD normal. RCA tortuous but otherwise normal. LVEF 55-60%. LVEDP 19 mmHg. Patent left subclavian artery stent with atretic LIMA.  LHC (10/24/08): LMCA normal. LAD with midvessel bridge and 40% stenosis in systole. Ostial LAD with 30% stenosis. LCx with mild luminal irregularities. RCA with mild luminal irregularities. Patent  left subclavian artery stent. LIMA -> LAD proximally occluded.    Assessment and Plan  1. CAD with other forms of angina - chest pain resolved on ranexa, however reports significant fatigue - d/c ranexa, follow symptoms. If recurrence chest pain, may have to lower his lisinopril dose and try norvasc. He had headaches on imdur in the past.   2. Hyperlipidemia - repeat lipid panel and liver tests. Reports occasional rash that he is not sure if is related to repatha, reports significant allergies in general. Continue to monitor at this time  3. Chronic heart failure with preserved EF - some evidence of extra fluid, Im not convinced all his recent weight gain is fluid - increase lasix to 88m daily, check kidney function and K/Mg next week  4. HTN - at goal, continue current meds      JArnoldo Lenis M.D.

## 2018-04-20 NOTE — Telephone Encounter (Signed)
Dalton [name], pt called to find out what he needs to do. Please advise!

## 2018-04-21 MED ORDER — LINACLOTIDE 290 MCG PO CAPS: 290.0000 ug | ORAL_CAPSULE | Freq: Every day | ORAL | 1 refills | Status: DC

## 2018-04-21 NOTE — Telephone Encounter (Addendum)
Yes, lets increase to 290. I will send in an Rx for a month. Otherwise we can call him when we get samples in for him to pick up. If he prefers to use samples, he can disregard the Rx.  Regardless, please have him call in 2-4 weeks with a progress report on the increased dose.

## 2018-04-21 NOTE — Addendum Note (Signed)
Addended by: Gordy Levan, Boen Sterbenz A on: 04/21/2018 03:18 PM   Modules accepted: Orders

## 2018-04-24 NOTE — Telephone Encounter (Signed)
Tried to call. Mailbox full and could not leave a message. Mailing a letter to call.

## 2018-06-07 ENCOUNTER — Other Ambulatory Visit: Payer: Self-pay

## 2018-06-07 ENCOUNTER — Telehealth: Payer: Self-pay | Admitting: *Deleted

## 2018-06-07 ENCOUNTER — Ambulatory Visit (INDEPENDENT_AMBULATORY_CARE_PROVIDER_SITE_OTHER): Payer: Medicare Other | Admitting: Nurse Practitioner

## 2018-06-07 DIAGNOSIS — K5900 Constipation, unspecified: Secondary | ICD-10-CM

## 2018-06-07 DIAGNOSIS — K219 Gastro-esophageal reflux disease without esophagitis: Secondary | ICD-10-CM

## 2018-06-07 NOTE — Telephone Encounter (Signed)
Called patient for Virtual vist at 8:58am and no answer-LMOVM to call for appointment.  Called patient at 9:10am-no answer.  Please No Show patient Dalton [name]. Thanks

## 2018-06-07 NOTE — Telephone Encounter (Signed)
Patient rescheduled

## 2018-06-07 NOTE — Progress Notes (Signed)
Visit was cancelled by the patient. 

## 2018-06-12 ENCOUNTER — Telehealth: Payer: Self-pay

## 2018-06-12 ENCOUNTER — Encounter: Payer: Self-pay | Admitting: Nurse Practitioner

## 2018-06-12 ENCOUNTER — Other Ambulatory Visit: Payer: Self-pay

## 2018-06-12 ENCOUNTER — Ambulatory Visit (INDEPENDENT_AMBULATORY_CARE_PROVIDER_SITE_OTHER): Payer: Medicare Other | Admitting: Nurse Practitioner

## 2018-06-12 ENCOUNTER — Encounter: Payer: Self-pay | Admitting: Gastroenterology

## 2018-06-12 DIAGNOSIS — K219 Gastro-esophageal reflux disease without esophagitis: Secondary | ICD-10-CM

## 2018-06-12 DIAGNOSIS — K5900 Constipation, unspecified: Secondary | ICD-10-CM | POA: Diagnosis not present

## 2018-06-12 MED ORDER — NALOXEGOL OXALATE 25 MG PO TABS: 25.0000 mg | ORAL_TABLET | Freq: Every day | ORAL | 5 refills | Status: DC

## 2018-06-12 NOTE — Patient Instructions (Signed)
Your health issues we discussed today were:   GERD (heartburn): 1. Continue taking your current medication (Dexilant). 2. Call us if you have any severe worsening symptoms  Constipation: 1. I am sorry you are still having issues with constipation 2. I will start you on a new medicine called Movantik.  Take Movantik 25 mg once a day.  Only take this while you are taking pain medicines 3. Call us in 2 weeks and let us know if it is helping 4. Let us know if you have any severe worsening symptoms  Overall I recommend:  1. Continue your other medications 2. Return for follow-up in 2 months 3. Call us if you have any questions or concerns.   Because of recent events of COVID-19 ("Coronavirus"), follow CDC recommendations:  1. Wash your hand frequently 2. Avoid touching your face 3. Stay away from people who are sick 4. If you have symptoms such as fever, cough, shortness of breath then call your healthcare provider for further guidance 5. If you are sick, STAY AT HOME unless otherwise directed by your healthcare provider. 6. Follow directions from state and national officials regarding staying safe   At [facility] we value your feedback. You may receive a survey about your visit today. Please share your experience as we strive to create trusting relationships with our patients to provide genuine, compassionate, quality care.  We appreciate your understanding and patience as we review any laboratory studies, imaging, and other diagnostic tests that are ordered as we care for you. Our office policy is 5 business days for review of these results, and any emergent or urgent results are addressed in a timely manner for your best interest. If you do not hear from our office in 1 week, please contact us.   We also encourage the use of MyChart, which contains your medical information for your review as well. If you are not enrolled in this feature, an access code is on this after  visit summary for your convenience. Thank you for allowing [place] to be involved in your care.  It was great to see you today!  I hope you have a great day!!

## 2018-06-12 NOTE — Assessment & Plan Note (Signed)
Constipation doing well on Linzess 290 mcg.  Did have use magnesium citrate due to significant constipation several days ago which resulted in vomiting and large bowel movements.  His abdominal pain and other associated symptoms resolved after that.  At this point he is tried and failed Amitiza and Linzess.  I will trial him on Movantik as he is on chronic pain medications.  I will give him 25 mg once a day, can use over-the-counter laxatives as needed in the interim as a bridge to effective daily controller therapy.  Follow-up in 2 months.

## 2018-06-12 NOTE — Assessment & Plan Note (Signed)
Currently well managed.  Continue Dexilant.  Follow-up in 2 months.

## 2018-06-12 NOTE — Progress Notes (Signed)
cc'ed to pcp °

## 2018-06-12 NOTE — Telephone Encounter (Signed)
PA for Movantik 25 mg has been submitted through [email or web address]. waiting on an approval or denial.

## 2018-06-12 NOTE — Progress Notes (Signed)
Referring Provider: Nolene Ebbs, MD Primary Care Physician:  Nolene Ebbs, MD Primary GI:  Dr. Oneida Alar  NOTE: Service was provided via telemedicine and was requested by the patient due to COVID-19 pandemic.  Method of visit: Telephone (Zoom failed)  Patient Location: On the way home (medical service driving, not patient)  Provider Location: Office  Reason for Phone Visit: Follow-up on GERD and Constipation  The patient was consented to phone follow-up via telephone encounter including billing of the encounter (yes/no): Yes  Persons present on the phone encounter, with roles: None  Total time (minutes) spent on medical discussion: 18 minutes  Chief Complaint  Patient presents with  . Constipation    HPI:   Dalton [name] is a 58 y.o. male who presents for virtual visit regarding: Follow-up on GERD and constipation.  The patient was last seen in our office 03/06/2018 for the same as well as lower abdominal pain.  Known history of tubular adenoma on colonoscopy which is up-to-date as of 07/05/2017 with rectal bleeding due to large internal hemorrhoids.  Recommended 3-year repeat in 2020.  Amitiza previously did not help and had been using mag citrate as needed.  Ongoing constipation despite efforts to eat high-fiber foods.  Had never tried Linzess before.  Constipation associated with lower abdominal pain.  GERD waxing and waning on Dexilant, despite previously well controlled, and symptom flares started when he started Repatha.  Intermittent chest pain with significant cardiac history followed by cardiology.  No other GI complaints.  Recommended continue Dexilant, start Linzess 72 mcg and stop Amitiza, follow-up in 3 months.  He called our office 04/17/2018 noting Linzess not very effective.  The patient admitted doubling his dose for 5 days with no improvement.  Recommended increase to Linzess 290 mcg daily.  Also refilled prescription for Dexilant.  Requested a 2 to 4-week  follow-up.  Today he states he's doing ok overall. Constipation not improved on Linzess 290 mcg (can't remember the dose but states "I'm taking it as prescribed). Had severe constipation the other day and became swollen. Used Mag Citrate and ended up having a large bowel movement in addition to vomiting. Pain improved after a bowel movement. Is drinking a lot of water and eating fiber foods.  Is on chronic pain medication. Denies any other abdominal pain, fever, chills, unintentional weight loss. Some scant toilet tissue hematochezia when he strains. GERD doing ok at the moment.  Denies chest pain, dyspnea, dizziness, lightheadedness, syncope, near syncope. Denies any other upper or lower GI symptoms.  Past Medical History:  Diagnosis Date  . Anemia   . Anxiety   . Bipolar disorder ([place])   . CHF (congestive heart failure) ([place])   . Chronic back pain    Pain Clinic in [place]  . Chronic bronchitis   . Chronic lower back pain   . Congestive heart failure (CHF) ([place])   . Coronary artery disease   . Coughing   . Depression   . DVT (deep venous thrombosis) ([place]) ~ 2005   LLE  . Frequency of urination   . GERD (gastroesophageal reflux disease)   . 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 RUEAVWUJ(811.9)    "a few times/week" (12/24/2015)  . High cholesterol   . HNP (herniated nucleus pulposus), cervical   . Hypertension   . Laceration of right hand 11/27/2010  . Laceration of wrist 2007 BIL FOREARMS  . MI (myocardial  infarction) ([place])    7, last one was in 2011 (12/24/2015)  . Neuropathy   . NSAID-induced gastric ulcer    "Ibuprofen"  . PUD (peptic ulcer disease)    in 1990s, secondary to medication  . Rheumatoid arthritis ([place])    "all over" (12/24/2015)  . Shortness of breath    with exertion  . Tonsillitis, chronic    Dr. Vicki Mallet in [place]  . Type II diabetes mellitus ([place])   . Wears glasses      Past Surgical History:  Procedure Laterality Date  . ANTERIOR CERVICAL DECOMP/DISCECTOMY FUSION N/A 11/05/2016   Procedure: Anterior Cervical Discectomy and Fusion - Cervical three-Cervical four;  Surgeon: Earnie Larsson, MD;  Location: [facility];  Service: Neurosurgery;  Laterality: N/A;  . BACK SURGERY     x3  . BIOPSY N/A 05/30/2012   Procedure: BIOPSY;  Surgeon: Danie Binder, MD;  Location: [facility];  Service: Endoscopy;  Laterality: N/A;  . BIOPSY  03/02/2016   Procedure: BIOPSY;  Surgeon: Danie Binder, MD;  [facility];  Service: Endoscopy;;  gastric  . CARDIAC CATHETERIZATION  "several"  . CARPAL TUNNEL RELEASE Bilateral   . CATARACT EXTRACTION W/PHACO Right 06/15/2016   Procedure: CATARACT EXTRACTION PHACO AND INTRAOCULAR LENS PLACEMENT (IOC);  Surgeon: Rutherford Guys, MD;  Location: [facility];  Service: Ophthalmology;  Laterality: Right;  CDE: 4.64  . CATARACT EXTRACTION W/PHACO Left 07/13/2016   Procedure: CATARACT EXTRACTION PHACO AND INTRAOCULAR LENS PLACEMENT (IOC);  Surgeon: Rutherford Guys, MD;  Location: [facility];  Service: Ophthalmology;  Laterality: Left;  CDE: 3.15  . COLONOSCOPY  12/28/2010   SLF: (MAC)Internal hemorrhoids/four small colon polyps tubular adenomas. per SLF: colonoscopy 2022  . COLONOSCOPY WITH PROPOFOL N/A 07/05/2017   Procedure: COLONOSCOPY WITH PROPOFOL;  Surgeon: Danie Binder, MD;  [facility];  Service: Endoscopy;  Laterality: N/A;  7:30am  . CORONARY ARTERY BYPASS GRAFT  2002   3 vessels  . ESOPHAGOGASTRODUODENOSCOPY N/A 05/30/2012   SLF: UNCONTROLLED GERD DUE TO LIFESTYLE CHOICE/WEIGHT GAIN/MILD Non-erosive gastritis  . ESOPHAGOGASTRODUODENOSCOPY (EGD) WITH PROPOFOL N/A 03/02/2016   Dr. Oneida Alar: Esophagus appeared normal, impaired dilation performed, patchy inflammation with edema and erythema of the entire stomach., Biopsy with H pylori, patient completed Pylera.   [name] Kitchen FRACTURE SURGERY    . LEFT HEART CATHETERIZATION WITH CORONARY ANGIOGRAM N/A  08/31/2011   Procedure: LEFT HEART CATHETERIZATION WITH CORONARY ANGIOGRAM;  Surgeon: Laverda Page, MD;  [facility];  Service: Cardiovascular;  Laterality: N/A;  . LUMBAR DISC SURGERY     "L4-5; Dr. Trenton Gammon"  . MULTIPLE TOOTH EXTRACTIONS    . ORIF MANDIBULAR FRACTURE N/A 12/19/2015   Procedure: OPEN REDUCTION INTERNAL FIXATION (ORIF) MANDIBULAR FRACTURE;  Surgeon: Izora Gala, MD;  Location: [place];  Service: ENT;  Laterality: N/A;  . PATELLA FRACTURE SURGERY Left 1976   plate to knee cap from accident  . POLYPECTOMY  07/05/2017   Procedure: POLYPECTOMY;  Surgeon: Danie Binder, MD;  [facility];  Service: Endoscopy;;  colon  . SAVORY DILATION  12/28/2010   SLF:(MAC)J-shaped stomach/nodular mocosa in the distal esophagus/empiric dilation 10m  . SAVORY DILATION N/A 03/02/2016   Procedure: SAVORY DILATION;  Surgeon: SDanie Binder MD;  [facility];  Service: Endoscopy;  Laterality: N/A;    Current Outpatient Medications  Medication Sig Dispense Refill  . albuterol (PROVENTIL HFA;VENTOLIN HFA) 108 (90 Base) MCG/ACT inhaler Inhale 2 puffs into the lungs every 6 (  six) hours as needed for wheezing or shortness of breath.     . alprazolam (XANAX) 2 MG tablet Take 2 mg by mouth 2 (two) times daily.    [name] Kitchen aspirin EC 81 MG tablet Take 81 mg by mouth at bedtime.    [name] Kitchen atorvastatin (LIPITOR) 80 MG tablet Take 1 tablet (80 mg total) by mouth daily. 90 tablet 3  . beclomethasone (QVAR) 80 MCG/ACT inhaler Inhale 2 puffs into the lungs 2 (two) times daily.    . cetirizine (ZYRTEC) 10 MG tablet Take 10 mg by mouth daily as needed for allergies.   0  . dexlansoprazole (DEXILANT) 60 MG capsule Take 1 capsule (60 mg total) by mouth daily. 30 capsule 5  . Evolocumab (REPATHA SURECLICK) 197 MG/ML SOAJ Inject 140 mg into the skin every 14 (fourteen) days. 2 pen 11  . ezetimibe (ZETIA) 10 MG tablet Take 1 tablet (10 mg total) by mouth daily. 90 tablet 3  . fluticasone (FLONASE)  50 MCG/ACT nasal spray Place 2 sprays into both nostrils daily as needed for allergies.     [name] Kitchen glipiZIDE (GLUCOTROL) 10 MG tablet Take 10 mg by mouth daily before breakfast.    . ipratropium (ATROVENT) 0.02 % nebulizer solution Take 0.5 mg by nebulization 2 (two) times daily as needed (for congestion).    [name] Kitchen linaclotide (LINZESS) 290 MCG CAPS capsule Take 1 capsule (290 mcg total) by mouth daily before breakfast. 30 capsule 1  . lisinopril (PRINIVIL,ZESTRIL) 10 MG tablet Take 10 mg by mouth daily.    [name] Kitchen LYRICA 100 MG capsule Take 100 mg by mouth 2 (two) times daily as needed (pain).   0  . meclizine (ANTIVERT) 25 MG tablet Take 25 mg by mouth 3 (three) times daily as needed for dizziness.    . metFORMIN (GLUCOPHAGE) 500 MG tablet Take 1 tablet (500 mg total) by mouth daily with breakfast. (Patient taking differently: Take 500 mg by mouth 2 (two) times daily with a meal. ) 30 tablet 0  . metoprolol tartrate (LOPRESSOR) 25 MG tablet Take 0.5 tablets (12.5 mg total) by mouth 2 (two) times daily. 30 tablet 0  . NARCAN 4 MG/0.1ML LIQD nasal spray kit Place 1 spray into the nose once.   0  . nitroGLYCERIN (NITROSTAT) 0.4 MG SL tablet Place 1 tablet (0.4 mg total) under the tongue every 5 (five) minutes x 3 doses as needed for chest pain (If no relief after 3rd dose, proceed to the ED for an evaluation). 100 tablet 3  . oxyCODONE-acetaminophen (PERCOCET) 10-325 MG tablet Take 1 tablet by mouth every 4 (four) hours as needed for pain.    . potassium chloride (K-DUR,KLOR-CON) 10 MEQ tablet Take 1 tablet (10 mEq total) by mouth daily. 30 tablet 0  . ranitidine (ZANTAC) 300 MG tablet take 1 tablet by mouth at bedtime 30 tablet 5  . sertraline (ZOLOFT) 100 MG tablet Take 100 mg by mouth daily.    . sitaGLIPtin (JANUVIA) 100 MG tablet Take 100 mg by mouth daily.    . tamsulosin (FLOMAX) 0.4 MG CAPS capsule Take 1 capsule (0.4 mg total) by mouth daily after breakfast. 30 capsule 0  . furosemide (LASIX) 40 MG tablet  Take 1.5 tablets (60 mg total) by mouth daily. 135 tablet 1   No current facility-administered medications for this visit.     Allergies as of 06/12/2018 - Review Complete 06/12/2018  Allergen Reaction Noted  . Gabapentin Hives   . Ibuprofen Other (See Comments)   .  Zolpidem tartrate Other (See Comments)   . Naproxen Other (See Comments)     Family History  Problem Relation Age of Onset  . Diabetes Mother   . Hypertension Mother   . Heart attack Mother 56  . Hypertension Father   . Diabetes Father   . Heart attack Father 8  . Heart attack Other        mother, father, brother, sister all deceased due to MI  . Heart attack Sister   . Heart attack Brother   . Seizures Brother   . Heart failure Other   . Colon cancer Neg Hx   . Liver disease Neg Hx   . Anesthesia problems Neg Hx   . Hypotension Neg Hx   . Malignant hyperthermia Neg Hx   . Pseudochol deficiency Neg Hx   . Colon polyps Neg Hx     Social History   Socioeconomic History  . Marital status: Single    Spouse name: Not on file  . Number of children: 2  . Years of education: Not on file  . Highest education level: Not on file  Occupational History  . Occupation: disabled    Fish farm manager: NOT EMPLOYED  Social Needs  . Financial resource strain: Not on file  . Food insecurity:    Worry: Not on file    Inability: Not on file  . Transportation needs:    Medical: Not on file    Non-medical: Not on file  Tobacco Use  . Smoking status: Never Smoker  . Smokeless tobacco: Never Used  Substance and Sexual Activity  . Alcohol use: No    Alcohol/week: 0.0 standard drinks  . 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 Sexual activity: Not Currently  Lifestyle  . Physical activity:    Days per week: Not on file    Minutes per session: Not on file  . Stress: Not on file  Relationships  . Social connections:    Talks on phone: Not on  file    Gets together: Not on file    Attends religious service: Not on file    Active member of club or organization: Not on file    Attends meetings of clubs or organizations: Not on file    Relationship status: Not on file  Other Topics Concern  . Not on file  Social History Narrative   Lives w/ son-23/22    Review of Systems: Complete ROS negative except as per HPI.  Physical Exam: Note: limited exam due to virtual visit General:   Alert and oriented. Pleasant and cooperative. Ears:  Normal auditory acuity. Skin:  Intact without facial significant lesions or rashes. Neurologic:  Alert and oriented x4 Psych:  Alert and cooperative. Normal mood and affect.

## 2018-06-12 NOTE — Addendum Note (Signed)
Addended by: Gordy Levan, Sean Malinowski A on: 06/12/2018 09:22 AM   Modules accepted: Orders

## 2018-06-14 ENCOUNTER — Telehealth: Payer: Self-pay

## 2018-06-14 NOTE — Telephone Encounter (Signed)
PA for Movantil 25 mg was denied through pts insurance. Pt must try Laculose, Relistor or Enulose. The medications that he has tried and failed Linzess, Amitiza were put on the PA form.

## 2018-06-15 ENCOUNTER — Telehealth: Payer: Self-pay | Admitting: Cardiology

## 2018-06-15 MED ORDER — METHYLNALTREXONE BROMIDE 150 MG PO TABS
450.0000 mg | ORAL_TABLET | Freq: Every day | ORAL | 3 refills | Status: DC
Start: 2018-06-15 — End: 2018-09-06

## 2018-06-15 NOTE — Addendum Note (Signed)
Addended by: Gordy Levan, Enyla Lisbon A on: 06/15/2018 03:14 PM   Modules accepted: Orders

## 2018-06-15 NOTE — Telephone Encounter (Addendum)
Please tell the patient I am sending in an Rx for Relistor 450 mg daily.  Hold other constipation medications for now (Linzess, anything he may be taking OTC)  Call with progress report in 3 days. If we don't hear from him Monday then please call the patient to follow-up.

## 2018-06-15 NOTE — Telephone Encounter (Signed)
CONSENT OBTAINED ° ° °YOUR CARDIOLOGY TEAM HAS ARRANGED FOR AN E-VISIT FOR YOUR APPOINTMENT - PLEASE REVIEW IMPORTANT INFORMATION BELOW SEVERAL DAYS PRIOR TO YOUR APPOINTMENT ° °Due to the recent COVID-19 pandemic, we are transitioning in-person office visits to tele-medicine visits in an effort to decrease unnecessary exposure to our patients and staff. Medicare and most insurances are covering these visits without a copay needed. We also encourage you to sign up for MyChart if you have not already done so. You will need a smartphone if possible. For patients that do not have this, we can still complete the visit using a regular telephone but do prefer a smartphone to enable video when possible. You may have a close family member that lives with you that can help. If possible, we also ask that you have a blood pressure cuff and scale at home to measure your blood pressure, heart rate and weight prior to your scheduled appointment. Patients with clinical needs that need an in-person evaluation and testing will still be able to come to the office if absolutely necessary. If you have any questions, feel free to call our office. ° ° ° °IF YOU HAVE A SMARTPHONE, PLEASE DOWNLOAD THE WEBEX APP TO YOUR SMARTPHONE ° °- If Apple, go to App Store and type in WebEx in the search bar. Download [employer] Webex Meetings, the blue/green circle. The app is free but as with any other app download, your phone may require you to verify saved payment information or Apple password. You do NOT have to create a WebEx account. ° °- If Android, go to Google Play Store and type in WebEx in the search bar. Download [employer] Webex Meetings, the blue/green circle. The app is free but as with any other app download, your phone may require you to verify saved payment information or Android password. You do NOT have to create a WebEx account. ° °It is very helpful to have this downloaded before your visit. ° ° ° °2-3 DAYS BEFORE YOUR APPOINTMENT ° °You  will receive a telephone call from one of our [facility] team members - your caller ID may say "Unknown caller." If this is a video visit, we will confirm that you have been able to download the WebEx app. We will remind you check your blood pressure, heart rate and weight prior to your scheduled appointment. If you have an Apple Watch or Kardia, please upload any pertinent ECG strips the day before or morning of your appointment to MyChart. Our staff will also make sure you have reviewed the consent and agree to move forward with your scheduled tele-health visit.  ° ° ° °THE DAY OF YOUR APPOINTMENT ° °Approximately 15 minutes prior to your scheduled appointment, you will receive a telephone call from one of Heart[facility] team - your caller ID may say "Unknown caller."  Our staff will confirm medications, vital signs for the day and any symptoms you may be experiencing. Please have this information available prior to the time of visit start. It may also be helpful for you to have a pad of paper and pen handy for any instructions given during your visit. They will also walk you through joining the WebEx smartphone meeting if this is a video visit. ° ° ° °CONSENT FOR TELE-HEALTH VISIT - PLEASE REVIEW ° °I hereby voluntarily request, consent and authorize [facility] and its employed or contracted physicians, physician assistants, nurse practitioners or other licensed health care professionals (the Practitioner), to provide me with telemedicine health care   services (the “Services") as deemed necessary by the treating Practitioner. I acknowledge and consent to receive the Services by the Practitioner via telemedicine. I understand that the telemedicine visit will involve communicating with the Practitioner through live audiovisual communication technology and the disclosure of certain medical information by electronic transmission. I acknowledge that I have been given the opportunity to request an in-person assessment or  other available alternative prior to the telemedicine visit and am voluntarily participating in the telemedicine visit. ° °I understand that I have the right to withhold or withdraw my consent to the use of telemedicine in the course of my care at any time, without affecting my right to future care or treatment, and that the Practitioner or I may terminate the telemedicine visit at any time. I understand that I have the right to inspect all information obtained and/or recorded in the course of the telemedicine visit and may receive copies of available information for a reasonable fee.  I understand that some of the potential risks of receiving the Services via telemedicine include:  °• Delay or interruption in medical evaluation due to technological equipment failure or disruption; °• Information transmitted may not be sufficient (e.g. poor resolution of images) to allow for appropriate medical decision making by the Practitioner; and/or  °• In rare instances, security protocols could fail, causing a breach of personal health information. ° °Furthermore, I acknowledge that it is my responsibility to provide information about my medical history, conditions and care that is complete and accurate to the best of my ability. I acknowledge that Practitioner's advice, recommendations, and/or decision may be based on factors not within their control, such as incomplete or inaccurate data provided by me or distortions of diagnostic images or specimens that may result from electronic transmissions. I understand that the practice of medicine is not an exact science and that Practitioner makes no warranties or guarantees regarding treatment outcomes. I acknowledge that I will receive a copy of this consent concurrently upon execution via email to the email address I last provided but may also request a printed copy by calling the office of [facility].   ° °I understand that my insurance will be billed for this visit.  ° °I  have read or had this consent read to me. °• I understand the contents of this consent, which adequately explains the benefits and risks of the Services being provided via telemedicine.  °• I have been provided ample opportunity to ask questions regarding this consent and the Services and have had my questions answered to my satisfaction. °• I give my informed consent for the services to be provided through the use of telemedicine in my medical care ° °By participating in this telemedicine visit I agree to the above. ° °

## 2018-06-15 NOTE — Telephone Encounter (Signed)
Pt notified that his medication has been changed. Pt will call back with an update.

## 2018-06-20 ENCOUNTER — Encounter: Payer: Self-pay | Admitting: Cardiology

## 2018-06-20 ENCOUNTER — Telehealth (INDEPENDENT_AMBULATORY_CARE_PROVIDER_SITE_OTHER): Payer: Medicare Other | Admitting: Cardiology

## 2018-06-20 VITALS — BP 135/92 | HR 67 | Ht 70.0 in | Wt 263.0 lb

## 2018-06-20 DIAGNOSIS — I2510 Atherosclerotic heart disease of native coronary artery without angina pectoris: Secondary | ICD-10-CM | POA: Diagnosis not present

## 2018-06-20 DIAGNOSIS — I10 Essential (primary) hypertension: Secondary | ICD-10-CM

## 2018-06-20 DIAGNOSIS — E782 Mixed hyperlipidemia: Secondary | ICD-10-CM

## 2018-06-20 NOTE — Progress Notes (Signed)
Virtual Visit via Telephone Note   This visit type was conducted due to national recommendations for restrictions regarding the COVID-19 Pandemic (e.g. social distancing) in an effort to limit this patient's exposure and mitigate transmission in our community.  Due to his co-morbid illnesses, this patient is at least at moderate risk for complications without adequate follow up.  This format is felt to be most appropriate for this patient at this time.  The patient did not have access to video technology/had technical difficulties with video requiring transitioning to audio format only (telephone).  All issues noted in this document were discussed and addressed.  No physical exam could be performed with this format.  Please refer to the patient's chart for his  consent to telehealth for [facility].   Evaluation Performed:  Follow-up visit  Date:  06/20/2018   ID:  Dalton [name], DOB 1961/02/13, MRN [id number]  Patient Location: Home Provider Location: Office  PCP:  Nolene Ebbs, MD  Cardiologist:  Carlyle Dolly, MD  Electrophysiologist:  None   Chief Complaint:  6 month follow up  History of Present Illness:    Dalton [name] is a 58 y.o. male seen today for the following medical problems.    1. CAD with stable angina - history of CABG LIMA-LAD - last cath showed atretic LIMA, normal coronaries  - imdur lowered to 29m daily due to headaches and later stopped - started on ranexa 5033mbid at 08/2017 visit with Dr End. Antianginal therapy somewhat limited due to soft bp's - since starting ranexa has had some fatigue, but chest pain improved.   - last visit stopped ranexa due to fatigue. Symptoms somewhat improved  - no recent chest pain. No recent SOB/DOE. Had been walking regularly prior to COVID-19 restrictions - compliant with meds.   2. Chronic heart failure with preserved EF - 06/2017 echo LVEF 6038-17%diastolic function not reported but normal parameters   - last visit increased lasix to 6069maily. Repeat labs were stable in 12/2017 - compliant with diuretic. Weight down 6 lbs since last visit.   3. HTN - home bp's 130s/80s, compliant with meds   4. Hyperlipidemia - 12/2017 TC 72 HDL 36 TG 193 LDL 10 - he is on atorva 80 and zetia 10 and repatha    5. GERD - followed by GI    The patient does not have symptoms concerning for COVID-19 infection (fever, chills, cough, or new shortness of breath).    Past Medical History:  Diagnosis Date  . Anemia   . Anxiety   . Bipolar disorder ([place] . CHF (congestive heart failure) ([place] . [place]    [place]   . Chronic lower back pain   . Congestive heart failure (CHF) ([place] . Coronary artery disease   . Coughing   . Depression   . DVT (deep venous thrombosis) ([place] 2005   LLE  . Frequency of urination   . GERD (gastroesophageal reflux disease)   . 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[facility]  ent[facility], last seizure in 2011;unknown etiology-pt sts heriditary (12/24/2015)  . HeaRNHAFBXU(383.3  "a few times/week" (12/24/2015)  . High cholesterol   . HNP (herniated nucleus pulposus), cervical   . Hypertension   . Laceration of right hand 11/27/2010  . Laceration of wrist 2007 BIL FOREARMS  . MI (myocardial infarction) ([facility]  7, last one was  in 2011 (12/24/2015)  . Neuropathy   . NSAID-induced gastric ulcer    "Ibuprofen"  . PUD (peptic ulcer disease)    in 1990s, secondary to medication  . Rheumatoid arthritis ([place])    "all over" (12/24/2015)  . Shortness of breath    with exertion  . Tonsillitis, chronic    Dr. Vicki Mallet in [place]  . Type II diabetes mellitus ([place])   . Wears glasses    Past Surgical History:  Procedure Laterality Date  . ANTERIOR CERVICAL DECOMP/DISCECTOMY FUSION N/A 11/05/2016   Procedure: Anterior Cervical Discectomy and Fusion - Cervical three-Cervical four;  Surgeon: Earnie Larsson, MD;  Location: [place];  Service: Neurosurgery;  Laterality: N/A;  . BACK SURGERY     x3  . BIOPSY N/A 05/30/2012   Procedure: BIOPSY;  Surgeon: Danie Binder, MD;  [facility];  Service: Endoscopy;  Laterality: N/A;  . BIOPSY  03/02/2016   Procedure: BIOPSY;  Surgeon: Danie Binder, MD;  [facility];  Service: Endoscopy;;  gastric  . CARDIAC CATHETERIZATION  "several"  . CARPAL TUNNEL RELEASE Bilateral   . CATARACT EXTRACTION W/PHACO Right 06/15/2016   Procedure: CATARACT EXTRACTION PHACO AND INTRAOCULAR LENS PLACEMENT (IOC);  Surgeon: Rutherford Guys, MD;  Location: [facility];  Service: Ophthalmology;  Laterality: Right;  CDE: 4.64  . CATARACT EXTRACTION W/PHACO Left 07/13/2016   Procedure: CATARACT EXTRACTION PHACO AND INTRAOCULAR LENS PLACEMENT (IOC);  Surgeon: Rutherford Guys, MD;  Location: [facility];  Service: Ophthalmology;  Laterality: Left;  CDE: 3.15  . COLONOSCOPY  12/28/2010   SLF: (MAC)Internal hemorrhoids/four small colon polyps tubular adenomas. per SLF: colonoscopy 2022  . COLONOSCOPY WITH PROPOFOL N/A 07/05/2017   Procedure: COLONOSCOPY WITH PROPOFOL;  Surgeon: Danie Binder, MD;  [facility];  Service: Endoscopy;  Laterality: N/A;  7:30am  . CORONARY ARTERY BYPASS GRAFT  2002   3 vessels  . ESOPHAGOGASTRODUODENOSCOPY N/A 05/30/2012   SLF: UNCONTROLLED GERD DUE TO LIFESTYLE CHOICE/WEIGHT GAIN/MILD Non-erosive gastritis  . ESOPHAGOGASTRODUODENOSCOPY (EGD) WITH PROPOFOL N/A 03/02/2016   Dr. Oneida Alar: Esophagus appeared normal, impaired dilation performed, patchy inflammation with edema and erythema of the entire stomach., Biopsy with H pylori, patient completed Pylera.   [name] Kitchen FRACTURE SURGERY    . LEFT HEART CATHETERIZATION WITH CORONARY ANGIOGRAM N/A 08/31/2011   Procedure: LEFT HEART CATHETERIZATION WITH CORONARY ANGIOGRAM;  Surgeon: Laverda Page, MD;  [facility] C[facility] LAB;  Service: Cardiovascular;  Laterality: N/A;  . LUMBAR DISC SURGERY     "L4-5; Dr. Trenton Gammon"  . MULTIPLE TOOTH  EXTRACTIONS    . ORIF MANDIBULAR FRACTURE N/A 12/19/2015   Procedure: OPEN REDUCTION INTERNAL FIXATION (ORIF) MANDIBULAR FRACTURE;  Surgeon: Izora Gala, MD;  Location: [place];  Service: ENT;  Laterality: N/A;  . PATELLA FRACTURE SURGERY Left 1976   plate to knee cap from accident  . POLYPECTOMY  07/05/2017   Procedure: POLYPECTOMY;  Surgeon: Danie Binder, MD;  [facility];  Service: Endoscopy;;  colon  . SAVORY DILATION  12/28/2010   SLF:(MAC)J-shaped stomach/nodular mocosa in the distal esophagus/empiric dilation 75m  . SAVORY DILATION N/A 03/02/2016   Procedure: SAVORY DILATION;  Surgeon: SDanie Binder MD;  [facility];  Service: Endoscopy;  Laterality: N/A;     No outpatient medications have been marked as taking for the 06/20/18 encounter (Appointment) with BArnoldo Lenis MD.     Allergies:   Gabapentin; Ibuprofen; Zolpidem tartrate; and Naproxen   Social History  Tobacco Use  . Smoking status: Never Smoker  . Smokeless tobacco: Never Used  Substance Use Topics  . Alcohol use: No    Alcohol/week: 0.0 standard drinks  . 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 There is no history of Colon cancer, Liver disease, Anesthesia problems, Hypotension, Malignant hyperthermia, Pseudochol deficiency, or Colon polyps.  ROS:   Please see the history of present illness.     All other systems reviewed and are negative.   Prior CV studies:   The following studies were reviewed today: 06/2017 echo Study Conclusions  - Left ventricle: The  cavity size was normal. Wall thickness was increased in a pattern of mild LVH. Systolic function was normal. The estimated ejection fraction was in the range of 60% to 65%.  12/2015 nuclear stress IMPRESSION: 1. No reversible ischemia or infarction.  2. Normal left ventricular wall motion.  3. Left ventricular ejection fraction is 54%.  4. Non invasive risk stratification*: Low   LHC (08/31/11, Dr. Einar Gip): LMCA normal. LAD normal. LAD normal. RCA tortuous but otherwise normal. LVEF 55-60%. LVEDP 19 mmHg. Patent left subclavian artery stent with atretic LIMA.  LHC (10/24/08): LMCA normal. LAD with midvessel bridge and 40% stenosis in systole. Ostial LAD with 30% stenosis. LCx with mild luminal irregularities. RCA with mild luminal irregularities. Patent left subclavian artery stent. LIMA -> LAD proximally occluded.  Labs/Other Tests and Data Reviewed:    EKG:  na  Recent Labs: 06/28/2017: Hemoglobin 12.2; Platelets 189 12/26/2017: ALT 25; BUN 8; Creat 0.91; Magnesium 2.3; Potassium 3.8; Sodium 139   Recent Lipid Panel Lab Results  Component Value Date/Time   CHOL 72 12/26/2017 11:12 AM   CHOL 196 09/20/2017 08:07 AM   TRIG 193 (H) 12/26/2017 11:12 AM   HDL 36 (L) 12/26/2017 11:12 AM   HDL 39 (L) 09/20/2017 08:07 AM   CHOLHDL 2.0 12/26/2017 11:12 AM   LDLCALC 10 12/26/2017 11:12 AM    Wt Readings from Last 3 Encounters:  03/06/18 253 lb 12.8 oz (115.1 kg)  12/16/17 269 lb (122 kg)  11/29/17 257 lb 9.6 oz (116.8 kg)     Objective:    Vital Signs:  p 67 bp 135/92 Wt 263 lbs  Normal affect, normal speech pattern and tone. Comfortable, in no distress. No audible sounds of SOB or wheezing.   ASSESSMENT & PLAN:    1. CAD  - no recent symptoms. Side effects to imdur and ranexa as reported above. If recurrent chest pain would titrate norvas.   2. Hyperlipidemia - stop zetia, I think his statin and repatha are sufficent to control his lipids  3. Chronic heart  failure with preserved EF - controlled, continue current diuretic  4. HTN - upper end of goal range, continue current meds     COVID-19 Education: The signs and symptoms of COVID-19 were discussed with the patient and how to seek care for testing (follow up with PCP or arrange E-visit).  The importance of social distancing was discussed today.  Time:   Today, I have spent  18 minutes with the patient with telehealth technology discussing the above problems.     Medication Adjustments/Labs and Tests Ordered: Current medicines are reviewed at length with the patient today.  Concerns regarding medicines are outlined above.   Tests Ordered: No orders of the defined types were placed in this encounter.   Medication Changes: No orders of the defined types were placed in this encounter.   Disposition:  Follow up 4 months  Signed, Carlyle Dolly, MD  06/20/2018 9:42 AM    [facility]

## 2018-06-20 NOTE — Patient Instructions (Signed)
Your physician recommends that you schedule a follow-up appointment in: [place] has recommended you make the following change in your medication:   STOP ZETIA   Thank you for choosing [facility]!!

## 2018-06-28 ENCOUNTER — Other Ambulatory Visit: Payer: Self-pay | Admitting: Nurse Practitioner

## 2018-06-28 DIAGNOSIS — K5900 Constipation, unspecified: Secondary | ICD-10-CM

## 2018-07-07 ENCOUNTER — Other Ambulatory Visit: Payer: Self-pay | Admitting: Cardiology

## 2018-07-11 NOTE — Progress Notes (Signed)
REVIEWED-NO ADDITIONAL RECOMMENDATIONS. 

## 2018-07-31 IMAGING — DX DG CHEST 2V
2 series · 2 of 2 positions shown · non-contrast
Comparison: 04/16/2015

CLINICAL DATA: Central chest pain, shortness of breath for 3 days

EXAM:
CHEST  2 VIEW

[chest pa]
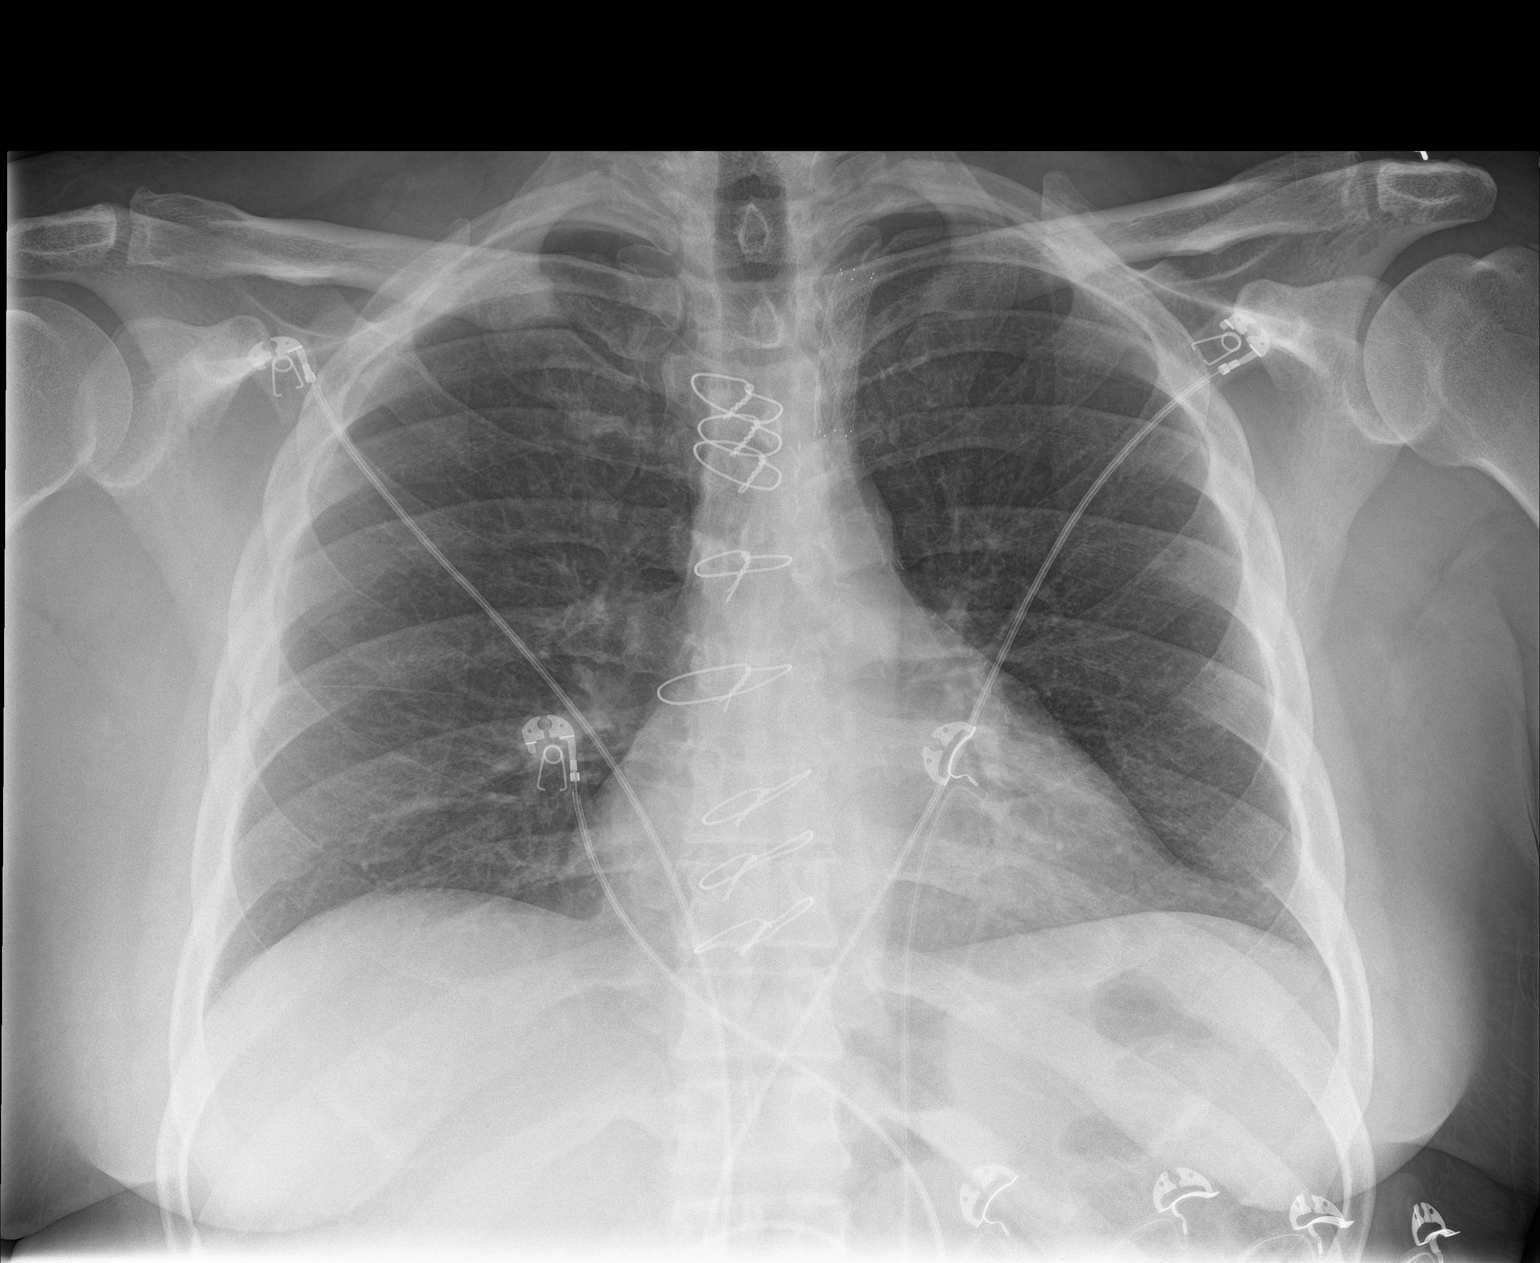

[chest lat]
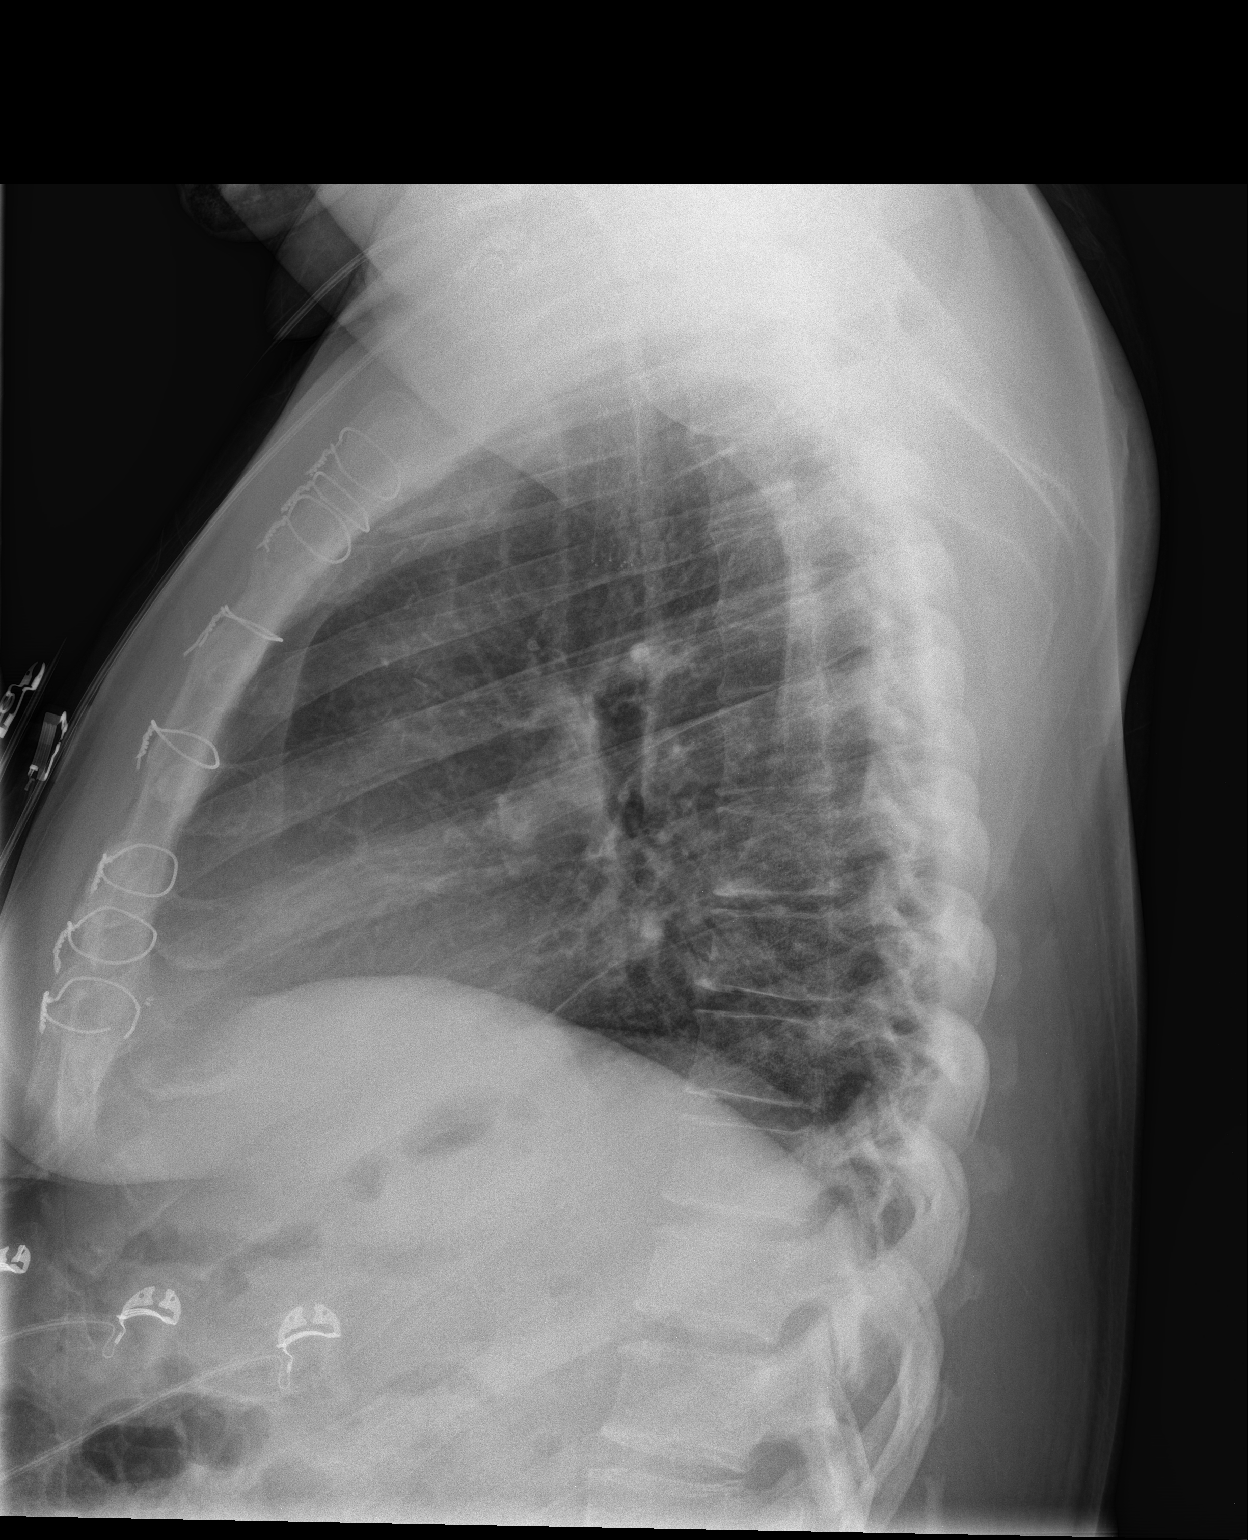

[2 of 2 positions shown; findings below may reference images not displayed]

FINDINGS: Cardiomediastinal silhouette is stable. No acute infiltrate or
pleural effusion. No pulmonary edema. Status post median sternotomy.
IMPRESSION: No active cardiopulmonary disease.

## 2018-08-02 NOTE — Telephone Encounter (Signed)
Faxed second PA for Relistor. The first PA was done on [email or web address]. more info was requested and faxed 08/02/18. Waiting on an approval or denial.

## 2018-08-11 ENCOUNTER — Telehealth: Payer: Self-pay | Admitting: *Deleted

## 2018-08-11 NOTE — Telephone Encounter (Signed)
Returned call and confirmed that repatha was already approved through 02/22/2019 per 03/23/18 phone note

## 2018-08-11 NOTE — Telephone Encounter (Signed)
Estill Bamberg from [facility] left a msg on the refill vm stating that the repatha is needing a PA. Number that she left to call was [phone]. Thanks, MI

## 2018-09-02 IMAGING — CR DG CHEST 2V
2 series · 2 of 2 positions shown · non-contrast
Comparison: Chest radiograph October 17, 2015

CLINICAL DATA: Chest pain and shortness of breath for several
weeks. History of hypertension, CHF.

EXAM:
CHEST  2 VIEW

[chest pa]
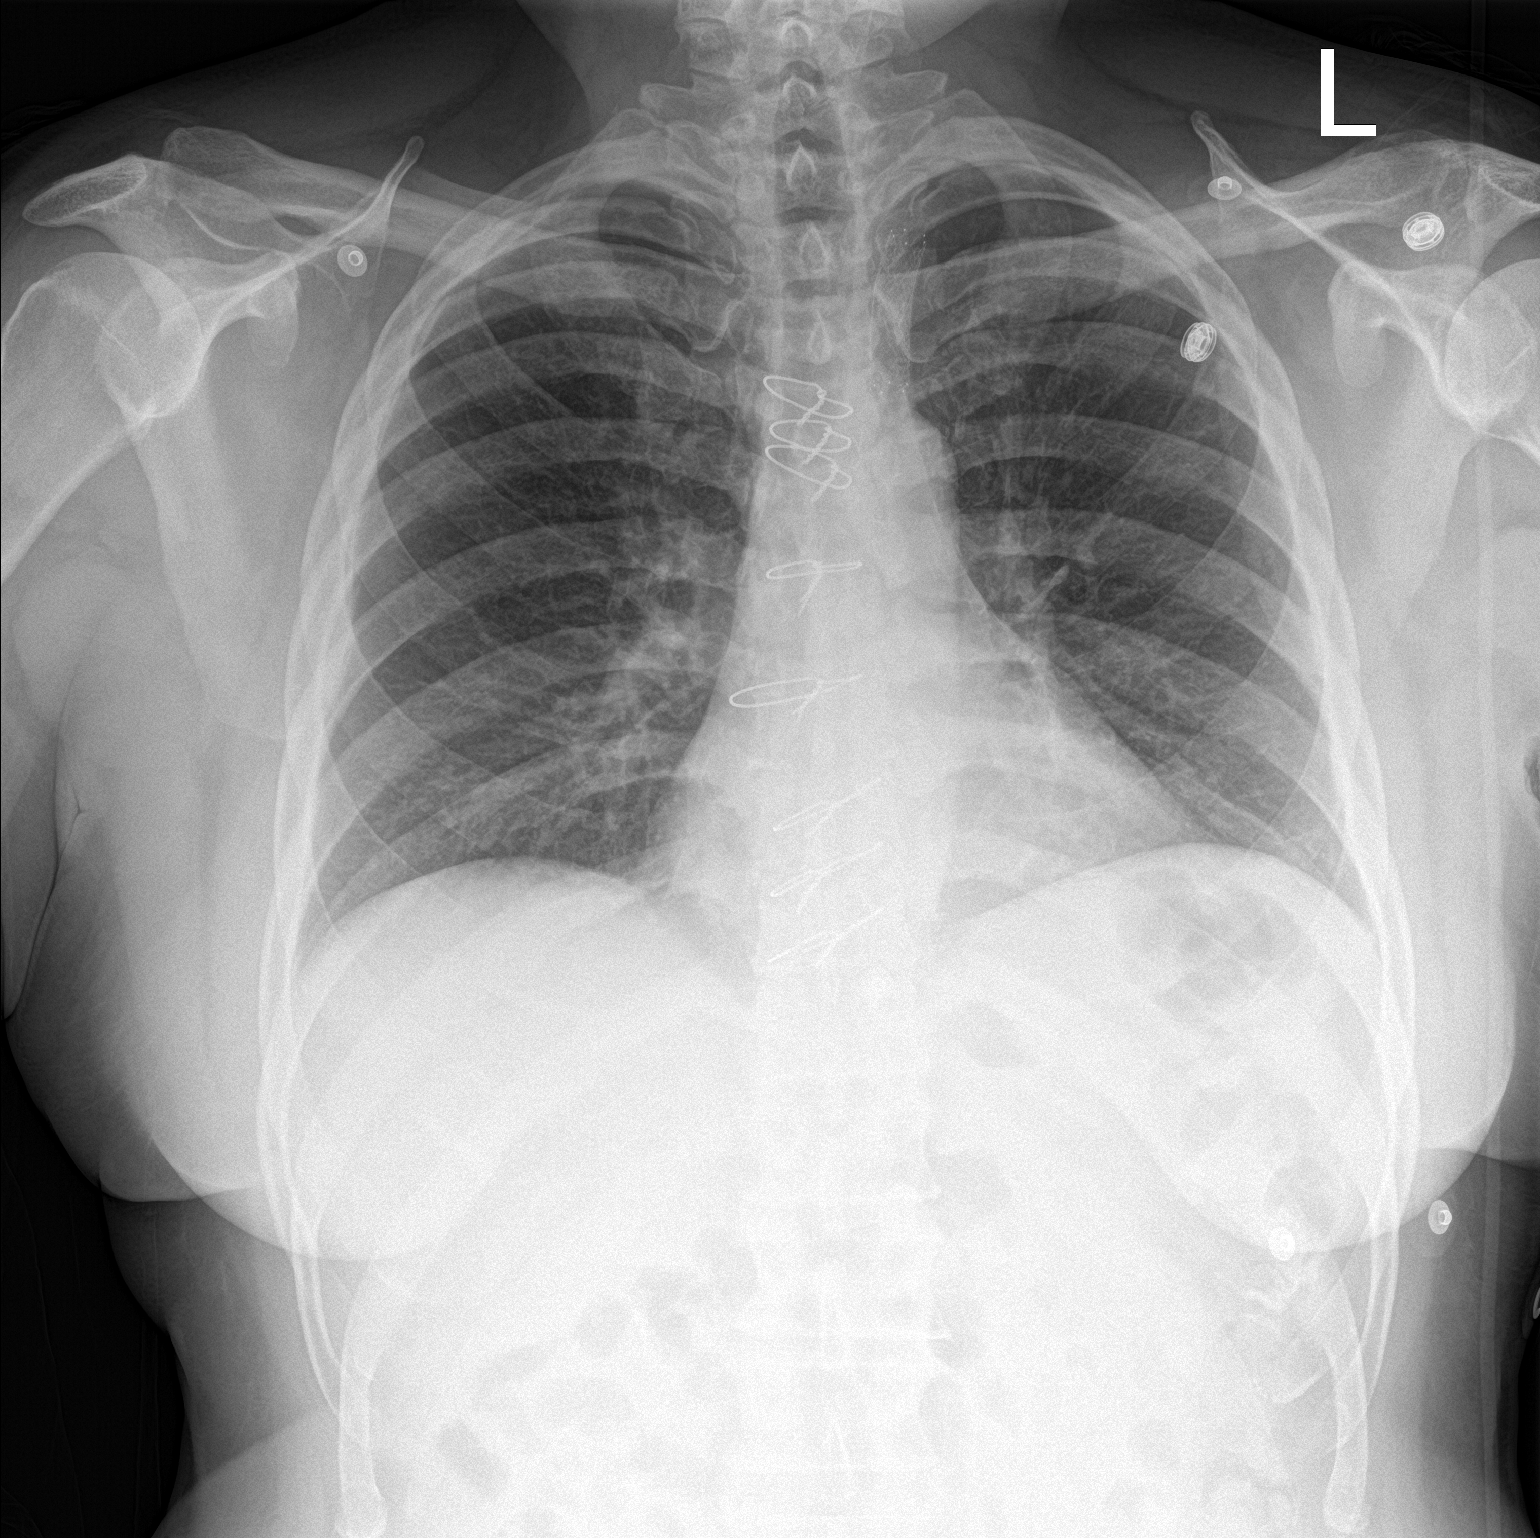

[chest lat]
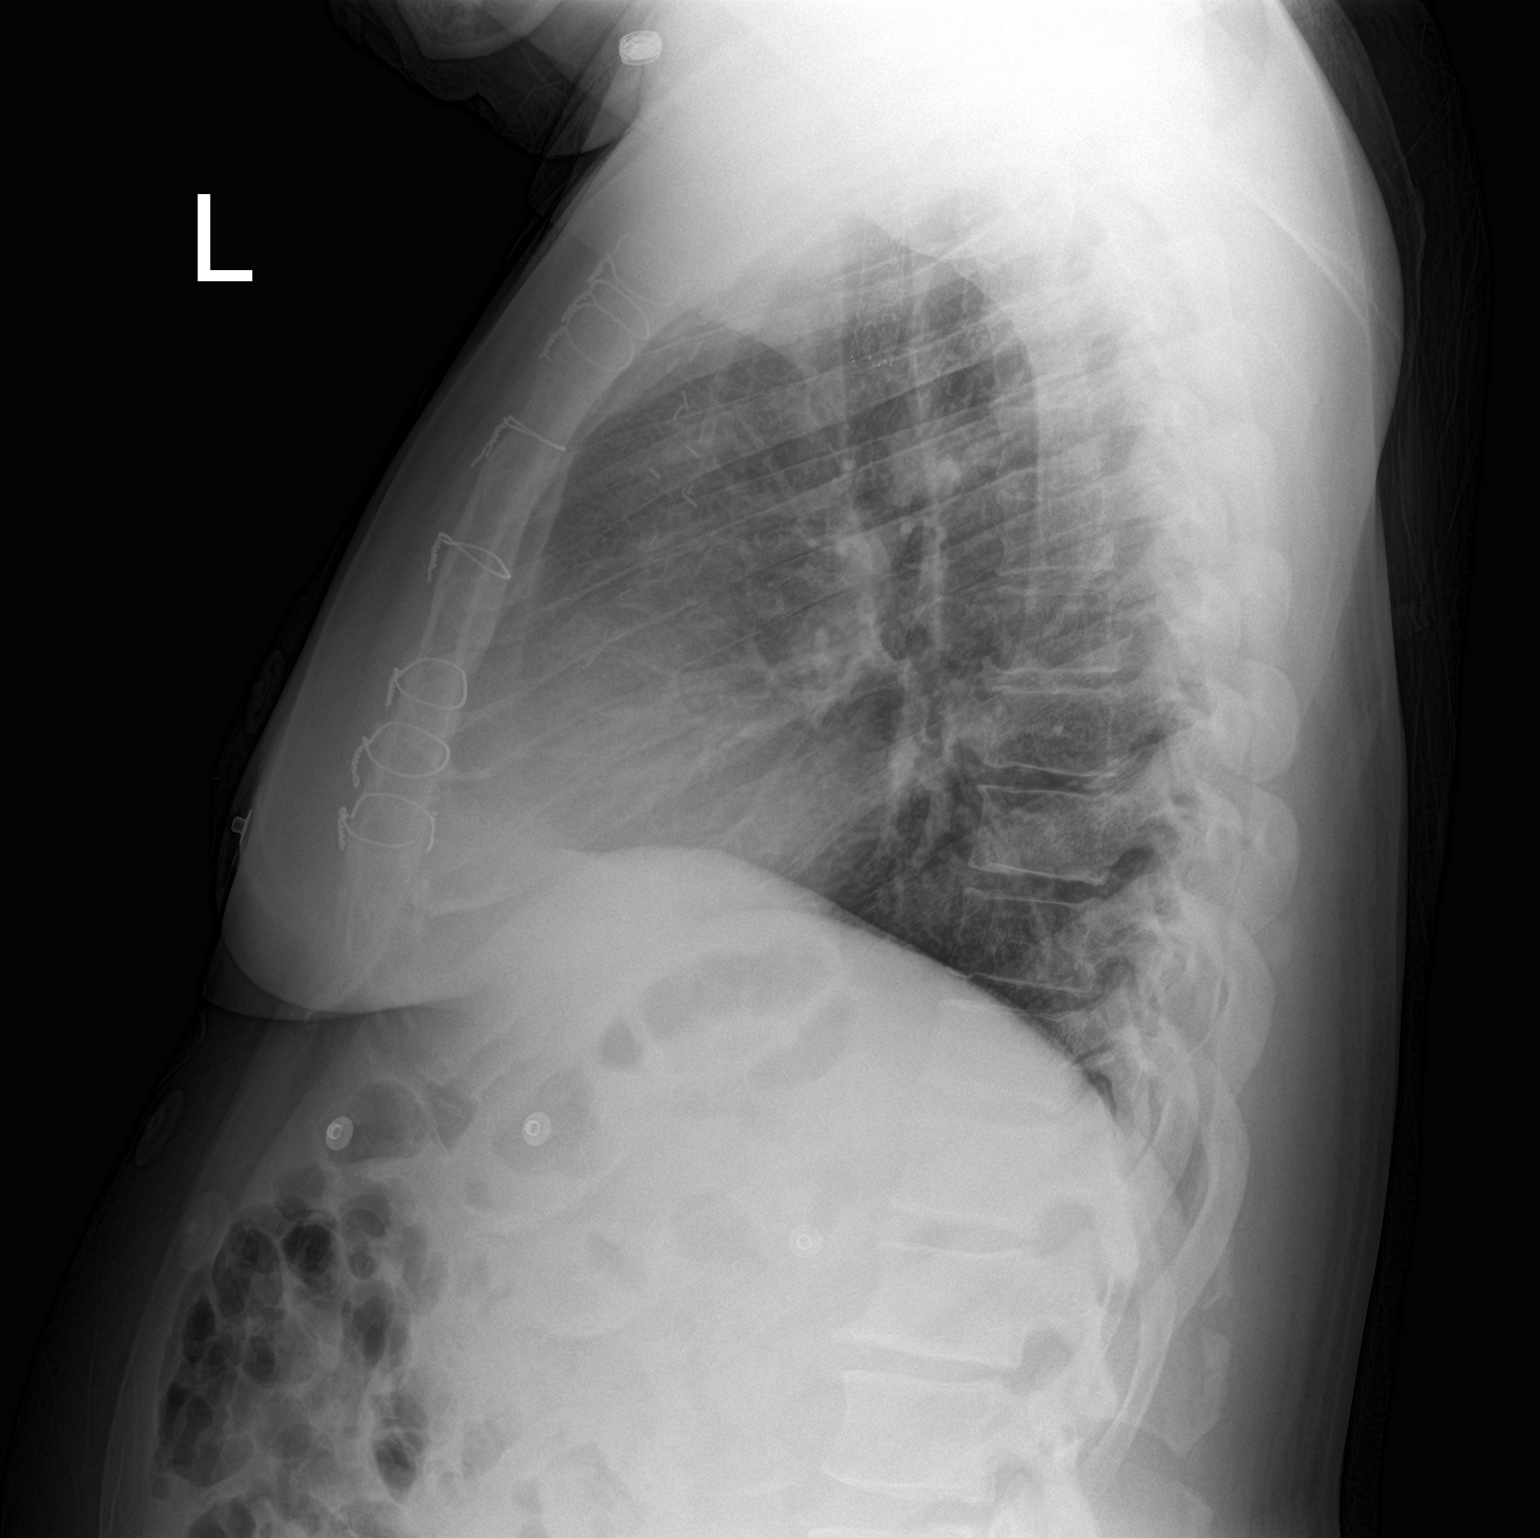

[2 of 2 positions shown; findings below may reference images not displayed]

FINDINGS: Cardiomediastinal silhouette is normal. Status post median
sternotomy for CABG. Fractured most caudal median sternotomy wire.
LEFT subclavian stent. No pleural effusions or focal consolidations.
Trachea projects midline and there is no pneumothorax. Soft tissue
planes and included osseous structures are non-suspicious.
IMPRESSION: No acute cardiopulmonary process.

## 2018-09-06 ENCOUNTER — Ambulatory Visit (INDEPENDENT_AMBULATORY_CARE_PROVIDER_SITE_OTHER): Payer: Medicare Other | Admitting: Nurse Practitioner

## 2018-09-06 ENCOUNTER — Other Ambulatory Visit: Payer: Self-pay

## 2018-09-06 ENCOUNTER — Encounter: Payer: Self-pay | Admitting: Nurse Practitioner

## 2018-09-06 VITALS — BP 126/81 | HR 62 | Temp 97.3°F | Ht 70.0 in | Wt 267.4 lb

## 2018-09-06 DIAGNOSIS — K5900 Constipation, unspecified: Secondary | ICD-10-CM | POA: Diagnosis not present

## 2018-09-06 DIAGNOSIS — K219 Gastro-esophageal reflux disease without esophagitis: Secondary | ICD-10-CM

## 2018-09-06 DIAGNOSIS — R1030 Lower abdominal pain, unspecified: Secondary | ICD-10-CM

## 2018-09-06 MED ORDER — NALOXEGOL OXALATE 25 MG PO TABS: 25.0000 mg | ORAL_TABLET | Freq: Every day | ORAL | 3 refills | Status: DC

## 2018-09-06 NOTE — Assessment & Plan Note (Signed)
The patient notes abdominal pain which is likely multifactorial in nature with history of GERD and poorly controlled constipation.  We will attempt to better get his constipation under control, as per above.  If he has persistent GERD symptoms we can alter his medication regimen to see if we can get some improvement in that arena.  I suspect when these are both adequately controlled his abdominal pain will subside significantly.  Call us for any worsening or severe abdominal pain and follow-up in 2 months.

## 2018-09-06 NOTE — Assessment & Plan Note (Signed)
Significant constipation and issues with getting appropriate medications covered. Linzess previously did not work very well and he has to take multiple doses as well as milk of magnesia and other options to help have a bowel movement.  We tried to get Movantik.  4 due to likely opioid induced constipation due to chronic pain medications.  However, they said Relistor would need to be tried first (or lactulose or Enulose) before Movantik would be covered.  We attempted to send in a prescription for [place] store and this was denied.  At this point, the lack of a better option, recommend continue Linzess.  Add Colace 100 mg 1-2 times a day.  Continue other medications as needed for bowel movements.  We will again attempt to prescribe Movantik and if it is denied Relistor I will attempt a peer to peer to discuss the issues were having.  Follow-up in 2 months.

## 2018-09-06 NOTE — Assessment & Plan Note (Signed)
Some breakthrough GERD symptoms about 2-3 times a week mostly is abdominal pain.  Query overlay of his poorly controlled constipation.  At this point recommend continue Dexilant.  Hopefully his symptoms will improve once his bloating from constipation is improved.  Follow-up in 2 months.  Call for any worsening or severe symptoms.

## 2018-09-06 NOTE — Progress Notes (Signed)
Referring Provider: Nolene Ebbs, MD Primary Care Physician:  Nolene Ebbs, MD Primary GI:  Dr. Oneida Alar  Chief Complaint  Patient presents with  . Gastroesophageal Reflux  . Constipation    HPI:   Dalton [name] is a 58 y.o. male who presents for follow-up on GERD and constipation.  Patient was last seen in our office 06/12/2018 which is a virtual visit due to COVID-19/coronavirus pandemic.  History of tubular adenoma on colonoscopy next due in 2020.  Previously tried and failed Amitiza and mag citrate for constipation.  Intermittent chest pain with significant cardiac history followed by cardiology.  Trial of Linzess not very effective and his dose was increased to 290 mcg of previous visit.  At his last visit constipation was not improved on Linzess 290 mcg and noted severe constipation the other day and became "swollen" requiring the use of mag citrate and subsequent large bowel movement in addition to vomiting.  Improved after his bowel movement.  Drinks a lot of water and eating fiber foods.  He is on chronic pain medication.  No other GI complaints.  GERD well controlled on Dexilant.  Recommended continue Dexilant, trial Movantik 25 mg once daily with request a progress report in 2 weeks.  Follow-up in 2 months.  Prior Auth for Movantik 25 mg was denied noted must try lactulose, Relistor, or Enulose.  Prescription for Relistor 450 mg daily, hold other constipation medications and call with a progress report in 3 days.  Prior Auth was sent to his insurance. No further information about which medication ended up being covered.  Today he states he's not doing great. Relistor wasn't paid for even though it was one of the options insurance stated he had to try prior to approving Movantik. Currently taking Linzess 290 mcg and needs to take 4 of them to have a bowel movement. Sometimes uses Magnesium Citrate. Occasional flares of GERD symptoms about 2-3 times in a week. Currently on  Dexilant. Having abdominal pain with his constipation which eventually improves with eventual bowel movement. Intermittent nausea, less frequent vomiting (twice this month). Denies hematochezia, fever, chills, unintentional weight loss. Denies URI or flu-like symptoms. Denies loss of sense of taste or smell. Denies chest pain, dyspnea, dizziness, lightheadedness, syncope, near syncope. Denies any other upper or lower GI symptoms.  Past Medical History:  Diagnosis Date  . Anemia   . Anxiety   . Bipolar disorder ([place])   . CHF (congestive heart failure) ([place])   . Chronic back pain    Pain Clinic in [place]  . Chronic bronchitis   . Chronic lower back pain   . Congestive heart failure (CHF) ([place])   . Coronary artery disease   . Coughing   . Depression   . DVT (deep venous thrombosis) ([place]) ~ 2005   LLE  . Frequency of urination   . GERD (gastroesophageal reflux disease)   . 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 LAGTXMIW(803.2)    "a few times/week" (12/24/2015)  . High cholesterol   . HNP (herniated nucleus pulposus), cervical   . Hypertension   . Laceration of right hand 11/27/2010  . Laceration of wrist 2007 BIL FOREARMS  . MI (myocardial infarction) ([place])    7, last one was in 2011 (12/24/2015)  . Neuropathy   . NSAID-induced gastric ulcer    "Ibuprofen"  . PUD (peptic ulcer disease)    in 1990s, secondary to  medication  . Rheumatoid arthritis ([place])    "all over" (12/24/2015)  . Shortness of breath    with exertion  . Tonsillitis, chronic    Dr. Vicki Mallet in [place]  . Type II diabetes mellitus ([place])   . Wears glasses     Past Surgical History:  Procedure Laterality Date  . ANTERIOR CERVICAL DECOMP/DISCECTOMY FUSION N/A 11/05/2016   Procedure: Anterior Cervical Discectomy and Fusion - Cervical three-Cervical four;  Surgeon: Earnie Larsson, MD;  Location: [facility];  Service: Neurosurgery;  Laterality:  N/A;  . BACK SURGERY     x3  . BIOPSY N/A 05/30/2012   Procedure: BIOPSY;  Surgeon: Danie Binder, MD;  Location: [facility];  Service: Endoscopy;  Laterality: N/A;  . BIOPSY  03/02/2016   Procedure: BIOPSY;  Surgeon: Danie Binder, MD;  [facility];  Service: Endoscopy;;  gastric  . CARDIAC CATHETERIZATION  "several"  . CARPAL TUNNEL RELEASE Bilateral   . CATARACT EXTRACTION W/PHACO Right 06/15/2016   Procedure: CATARACT EXTRACTION PHACO AND INTRAOCULAR LENS PLACEMENT (IOC);  Surgeon: Rutherford Guys, MD;  Location: [facility];  Service: Ophthalmology;  Laterality: Right;  CDE: 4.64  . CATARACT EXTRACTION W/PHACO Left 07/13/2016   Procedure: CATARACT EXTRACTION PHACO AND INTRAOCULAR LENS PLACEMENT (IOC);  Surgeon: Rutherford Guys, MD;  Location: [facility];  Service: Ophthalmology;  Laterality: Left;  CDE: 3.15  . COLONOSCOPY  12/28/2010   SLF: (MAC)Internal hemorrhoids/four small colon polyps tubular adenomas. per SLF: colonoscopy 2022  . COLONOSCOPY WITH PROPOFOL N/A 07/05/2017   Procedure: COLONOSCOPY WITH PROPOFOL;  Surgeon: Danie Binder, MD;  [facility];  Service: Endoscopy;  Laterality: N/A;  7:30am  . CORONARY ARTERY BYPASS GRAFT  2002   3 vessels  . ESOPHAGOGASTRODUODENOSCOPY N/A 05/30/2012   SLF: UNCONTROLLED GERD DUE TO LIFESTYLE CHOICE/WEIGHT GAIN/MILD Non-erosive gastritis  . ESOPHAGOGASTRODUODENOSCOPY (EGD) WITH PROPOFOL N/A 03/02/2016   Dr. Oneida Alar: Esophagus appeared normal, impaired dilation performed, patchy inflammation with edema and erythema of the entire stomach., Biopsy with H pylori, patient completed Pylera.   [name] Kitchen FRACTURE SURGERY    . LEFT HEART CATHETERIZATION WITH CORONARY ANGIOGRAM N/A 08/31/2011   Procedure: LEFT HEART CATHETERIZATION WITH CORONARY ANGIOGRAM;  Surgeon: Laverda Page, MD;  [facility] CATH LAB;  Service: Cardiovascular;  Laterality: N/A;  . LUMBAR DISC SURGERY     "L4-5; Dr. Trenton Gammon"  . MULTIPLE TOOTH EXTRACTIONS    . ORIF MANDIBULAR FRACTURE  N/A 12/19/2015   Procedure: OPEN REDUCTION INTERNAL FIXATION (ORIF) MANDIBULAR FRACTURE;  Surgeon: Izora Gala, MD;  Location: [facility];  Service: ENT;  Laterality: N/A;  . PATELLA FRACTURE SURGERY Left 1976   plate to knee cap from accident  . POLYPECTOMY  07/05/2017   Procedure: POLYPECTOMY;  Surgeon: Danie Binder, MD;  [facility];  Service: Endoscopy;;  colon  . SAVORY DILATION  12/28/2010   SLF:(MAC)J-shaped stomach/nodular mocosa in the distal esophagus/empiric dilation 13m  . SAVORY DILATION N/A 03/02/2016   Procedure: SAVORY DILATION;  Surgeon: SDanie Binder MD;  [facility];  Service: Endoscopy;  Laterality: N/A;    Current Outpatient Medications  Medication Sig Dispense Refill  . albuterol (PROVENTIL HFA;VENTOLIN HFA) 108 (90 Base) MCG/ACT inhaler Inhale 2 puffs into the lungs every 6 (six) hours as needed for wheezing or shortness of breath.     . alprazolam (XANAX) 2 MG tablet Take 2 mg by mouth 2 (two) times daily.    .[name] Kitchenaspirin EC 81 MG tablet  Take 81 mg by mouth at bedtime.    [name] Kitchen atorvastatin (LIPITOR) 80 MG tablet Take 1 tablet (80 mg total) by mouth daily. 90 tablet 3  . beclomethasone (QVAR) 80 MCG/ACT inhaler Inhale 2 puffs into the lungs 2 (two) times daily.    . cetirizine (ZYRTEC) 10 MG tablet Take 10 mg by mouth daily as needed for allergies.   0  . dexlansoprazole (DEXILANT) 60 MG capsule Take 1 capsule (60 mg total) by mouth daily. 30 capsule 5  . Evolocumab (REPATHA SURECLICK) 329 MG/ML SOAJ Inject 140 mg into the skin every 14 (fourteen) days. 2 pen 11  . fluticasone (FLONASE) 50 MCG/ACT nasal spray Place 2 sprays into both nostrils daily as needed for allergies.     . furosemide (LASIX) 40 MG tablet Take 1.5 tablets (60 mg total) by mouth daily. 135 tablet 1  . glipiZIDE (GLUCOTROL) 10 MG tablet Take 10 mg by mouth daily before breakfast.    . ipratropium (ATROVENT) 0.02 % nebulizer solution Take 0.5 mg by nebulization 2 (two) times daily as  needed (for congestion).    [name] Kitchen LINZESS 290 MCG CAPS capsule TAKE 1 CAPSULE(290 MCG) BY MOUTH DAILY BEFORE BREAKFAST 30 capsule 3  . lisinopril (PRINIVIL,ZESTRIL) 10 MG tablet Take 10 mg by mouth daily.    . meclizine (ANTIVERT) 25 MG tablet Take 25 mg by mouth 3 (three) times daily as needed for dizziness.    . metFORMIN (GLUCOPHAGE) 500 MG tablet Take 1 tablet (500 mg total) by mouth daily with breakfast. (Patient taking differently: Take 500 mg by mouth 2 (two) times daily with a meal. ) 30 tablet 0  . metoprolol tartrate (LOPRESSOR) 25 MG tablet Take 0.5 tablets (12.5 mg total) by mouth 2 (two) times daily. 30 tablet 0  . naloxegol oxalate (MOVANTIK) 25 MG TABS tablet Take 1 tablet (25 mg total) by mouth daily. 30 tablet 5  . NARCAN 4 MG/0.1ML LIQD nasal spray kit Place 1 spray into the nose once.   0  . nitroGLYCERIN (NITROSTAT) 0.4 MG SL tablet SEE NOTES 100 tablet 3  . oxyCODONE-acetaminophen (PERCOCET) 10-325 MG tablet Take 1 tablet by mouth every 4 (four) hours as needed for pain.    . potassium chloride (K-DUR,KLOR-CON) 10 MEQ tablet Take 1 tablet (10 mEq total) by mouth daily. 30 tablet 0  . ranitidine (ZANTAC) 300 MG tablet take 1 tablet by mouth at bedtime 30 tablet 5  . sertraline (ZOLOFT) 100 MG tablet Take 100 mg by mouth daily.    . sitaGLIPtin (JANUVIA) 100 MG tablet Take 100 mg by mouth daily.    . tamsulosin (FLOMAX) 0.4 MG CAPS capsule Take 1 capsule (0.4 mg total) by mouth daily after breakfast. 30 capsule 0   No current facility-administered medications for this visit.     Allergies as of 09/06/2018 - Review Complete 09/06/2018  Allergen Reaction Noted  . Gabapentin Hives   . Ibuprofen Other (See Comments)   . Zolpidem tartrate Other (See Comments)   . Naproxen Other (See Comments)     Family History  Problem Relation Age of Onset  . Diabetes Mother   . Hypertension Mother   . Heart attack Mother 64  . Hypertension Father   . Diabetes Father   . Heart attack  Father 73  . Heart attack Other        mother, father, brother, sister all deceased due to MI  . Heart attack Sister   . Heart attack Brother   .  Seizures Brother   . Heart failure Other   . Colon cancer Neg Hx   . Liver disease Neg Hx   . Anesthesia problems Neg Hx   . Hypotension Neg Hx   . Malignant hyperthermia Neg Hx   . Pseudochol deficiency Neg Hx   . Colon polyps Neg Hx     Social History   Socioeconomic History  . Marital status: Single    Spouse name: Not on file  . Number of children: 2  . Years of education: Not on file  . Highest education level: Not on file  Occupational History  . Occupation: disabled    Fish farm manager: NOT EMPLOYED  Social Needs  . Financial resource strain: Not on file  . Food insecurity    Worry: Not on file    Inability: Not on file  . Transportation needs    Medical: Not on file    Non-medical: Not on file  Tobacco Use  . Smoking status: Never Smoker  . Smokeless tobacco: Never Used  Substance and Sexual Activity  . Alcohol use: No    Alcohol/week: 0.0 standard drinks  . 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 Sexual activity: Not Currently  Lifestyle  . Physical activity    Days per week: Not on file    Minutes per session: Not on file  . Stress: Not on file  Relationships  . Social Herbalist on phone: Not on file    Gets together: Not on file    Attends religious service: Not on file    Active member of club or organization: Not on file    Attends meetings of clubs or organizations: Not on file    Relationship status: Not on file  Other Topics Concern  . Not on file  Social History Narrative   Lives w/ son-23/22    Review of Systems: Complete ROS negative except as per HPI.   Physical Exam: BP 126/81   Pulse 62   Temp (!) 97.3 F (36.3 C) (Oral)   Ht _0  (1.778 m)   Wt 267 lb 6.4 oz (121.3 kg)   BMI 38.37 kg/m   General:   Alert and oriented. Pleasant and cooperative. Well-nourished and well-developed.  Eyes:  Without icterus, sclera clear and conjunctiva pink.  Ears:  Normal auditory acuity. Cardiovascular:  S1, S2 present without murmurs appreciated. Extremities without clubbing or edema. Respiratory:  Clear to auscultation bilaterally. No wheezes, rales, or rhonchi. No distress.  Gastrointestinal:  +BS, soft, non-tender and non-distended. No HSM noted. No guarding or rebound. No masses appreciated.  Rectal:  Deferred  Musculoskalatal:  Symmetrical without gross deformities. Neurologic:  Alert and oriented x4;  grossly normal neurologically. Psych:  Alert and cooperative. Normal mood and affect. Heme/Lymph/Immune: No excessive bruising noted.    09/06/2018 11:14 AM   Disclaimer: This note was dictated with voice recognition software. Similar sounding words can inadvertently be transcribed and may not be corrected upon review.

## 2018-09-06 NOTE — Patient Instructions (Signed)
Your health issues we discussed today were:   Constipation: 1. Continue your current Linzess medications 2. Add Colace stool softener (over-the-counter) 100 mg once a day 3. Continue other current treatments 4. 2 have bowel movements. 5. Call us if you have any worsening or severe symptoms 6. We will try to get a prescription for Movantik approved to try to help your symptoms better  GERD (reflux/heartburn): 1. Continue your current medications 2. Hopefully your nausea and occasional vomiting will improve with improvement in your constipation 3. Call us if you have any worsening or severe symptoms specifically persistent vomiting if you are unable to control  Overall I recommend:  1. Continue your other current medications 2. Return for follow-up in 2 months 3. Call if you have any questions or concerns   Because of recent events of COVID-19 ("Coronavirus"), follow CDC recommendations:  1. Wash your hand frequently 2. Avoid touching your face 3. Stay away from people who are sick 4. If you have symptoms such as fever, cough, shortness of breath then call your healthcare provider for further guidance 5. If you are sick, STAY AT HOME unless otherwise directed by your healthcare provider. 6. Follow directions from state and national officials regarding staying safe   At [facility] we value your feedback. You may receive a survey about your visit today. Please share your experience as we strive to create trusting relationships with our patients to provide genuine, compassionate, quality care.  We appreciate your understanding and patience as we review any laboratory studies, imaging, and other diagnostic tests that are ordered as we care for you. Our office policy is 5 business days for review of these results, and any emergent or urgent results are addressed in a timely manner for your best interest. If you do not hear from our office in 1 week, please contact us.    We also encourage the use of MyChart, which contains your medical information for your review as well. If you are not enrolled in this feature, an access code is on this after visit summary for your convenience. Thank you for allowing [place] to be involved in your care.  It was great to see you today!  I hope you have a great summer!!

## 2018-09-08 NOTE — Progress Notes (Signed)
cc'd to pcp 

## 2018-09-21 ENCOUNTER — Telehealth: Payer: Self-pay | Admitting: *Deleted

## 2018-09-21 ENCOUNTER — Telehealth: Payer: Self-pay

## 2018-09-21 NOTE — Telephone Encounter (Signed)
EG,, I spoke with pts insurance rep concerning getting Movantik covered for pt. Several PA's and appeals have been done and they've all been denied. The insurance rep states that Amitiza is covered for pt and she was informed that Amitiza doesn't work well for the pt. Also Lactulose is also covered for the pt. Pt was prescribed Linzess at his last apt and pt has to take multiple pills in order to have a bowel movement.

## 2018-09-21 NOTE — Telephone Encounter (Signed)
Received refill request for imdur 30 mg from [facility]. Contacted pharmacy and spoke with Threasa Beards to inform her that imdur was stopped 08/2017 due to side effects.
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
## 2018-10-23 ENCOUNTER — Encounter: Payer: Self-pay | Admitting: Cardiology

## 2018-10-23 ENCOUNTER — Encounter: Payer: Self-pay | Admitting: *Deleted

## 2018-10-23 ENCOUNTER — Telehealth: Payer: Self-pay | Admitting: Cardiology

## 2018-10-23 ENCOUNTER — Telehealth (INDEPENDENT_AMBULATORY_CARE_PROVIDER_SITE_OTHER): Payer: Medicare Other | Admitting: Cardiology

## 2018-10-23 VITALS — BP 123/80 | HR 64 | Ht 70.0 in | Wt 264.0 lb

## 2018-10-23 DIAGNOSIS — I10 Essential (primary) hypertension: Secondary | ICD-10-CM

## 2018-10-23 DIAGNOSIS — R079 Chest pain, unspecified: Secondary | ICD-10-CM

## 2018-10-23 DIAGNOSIS — E782 Mixed hyperlipidemia: Secondary | ICD-10-CM

## 2018-10-23 DIAGNOSIS — I2510 Atherosclerotic heart disease of native coronary artery without angina pectoris: Secondary | ICD-10-CM

## 2018-10-23 NOTE — Patient Instructions (Signed)
Your physician wants you to follow-up in: [place] will receive a reminder letter in the mail two months in advance. If you don't receive a letter, please call our office to schedule the follow-up appointment.  Your physician recommends that you continue on your current medications as directed. Please refer to the Current Medication list given to you today.  Your physician has requested that you have a lexiscan myoview. For further information please visit [email or web address]. Please follow instruction sheet, as given.  Thank you for choosing [facility]!!

## 2018-10-23 NOTE — Telephone Encounter (Signed)
Pre-cert Verification for the following procedure    Lexiscan Myoview scheduled for 10-30-2018 at [facility]

## 2018-10-23 NOTE — Progress Notes (Signed)
Virtual Visit via Telephone Note   This visit type was conducted due to national recommendations for restrictions regarding the COVID-19 Pandemic (e.g. social distancing) in an effort to limit this patient's exposure and mitigate transmission in our community.  Due to his co-morbid illnesses, this patient is at least at moderate risk for complications without adequate follow up.  This format is felt to be most appropriate for this patient at this time.  The patient did not have access to video technology/had technical difficulties with video requiring transitioning to audio format only (telephone).  All issues noted in this document were discussed and addressed.  No physical exam could be performed with this format.  Please refer to the patient's chart for his  consent to telehealth for [facility].   Date:  10/23/2018   ID:  Dalton [name], DOB Dec 16, 1960, MRN [id number]  Patient Location: Home Provider Location: Office  PCP:  Nolene Ebbs, MD  Cardiologist:  Carlyle Dolly, MD  Electrophysiologist:  None   Evaluation Performed:  Follow-Up Visit  Chief Complaint:  Follow up visit  History of Present Illness:    Dalton [name] is a 58 y.o. male seen today for the following medical problems.    1. CAD with stable angina - history of CABG LIMA-LAD - last cath 2013 showed atretic LIMA, normal coronaries   - antianginal therapy limited by side effects. Fatigue on ranexa, headaches on imdur    - has had some recent chest pain. 5-6 episodes over the 2-3 months - can occur with activity or at rest. "strong pain", left of chest. 8/10 in severity, can get sweaty. Better with certain positions. Can have some SOB, can have some nausea.  - pain lasts 10-15 mintues, can be better with nitro at times - similar to prior chest pains.   2. Chronic heart failure with preserved EF - 06/2017 echo LVEF 64-40%, diastolic function not reported but normal parameters  - no recent  symptoms  3. HTN - compliant with meds  4. Hyperlipidemia - 12/2017 TC 72 HDL 36 TG 193 LDL 10 - he is on atorva and repatha    5. GERD - followed by GI   The patient does not have symptoms concerning for COVID-19 infection (fever, chills, cough, or new shortness of breath).    Past Medical History:  Diagnosis Date  . Anemia   . Anxiety   . Bipolar disorder ([place])   . CHF (congestive heart failure) ([place])   . Chronic back pain    Pain Clinic in [place]  . Chronic bronchitis   . Chronic lower back pain   . Congestive heart failure (CHF) ([place])   . Coronary artery disease   . Coughing   . Depression   . DVT (deep venous thrombosis) ([place]) ~ 2005   LLE  . Frequency of urination   . GERD (gastroesophageal reflux disease)   . 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 HKVQQVZD(638.7)    "a few times/week" (12/24/2015)  . High cholesterol   . HNP (herniated nucleus pulposus), cervical   . Hypertension   . Laceration of right hand 11/27/2010  . Laceration of wrist 2007 BIL FOREARMS  . MI (myocardial infarction) ([place])    7, last one was in 2011 (12/24/2015)  . Neuropathy   . NSAID-induced gastric ulcer    "Ibuprofen"  . PUD (peptic ulcer disease)    in 1990s, secondary to  medication  . Rheumatoid arthritis ([place])    "all over" (12/24/2015)  . Shortness of breath    with exertion  . Tonsillitis, chronic    Dr. Vicki Mallet in [place]  . Type II diabetes mellitus ([place])   . Wears glasses    Past Surgical History:  Procedure Laterality Date  . ANTERIOR CERVICAL DECOMP/DISCECTOMY FUSION N/A 11/05/2016   Procedure: Anterior Cervical Discectomy and Fusion - Cervical three-Cervical four;  Surgeon: Earnie Larsson, MD;  Location: [place];  Service: Neurosurgery;  Laterality: N/A;  . BACK SURGERY     x3  . BIOPSY N/A 05/30/2012   Procedure: BIOPSY;  Surgeon: Danie Binder, MD;  Location: [facility];  Service:  Endoscopy;  Laterality: N/A;  . BIOPSY  03/02/2016   Procedure: BIOPSY;  Surgeon: Danie Binder, MD;  [facility];  Service: Endoscopy;;  gastric  . CARDIAC CATHETERIZATION  "several"  . CARPAL TUNNEL RELEASE Bilateral   . CATARACT EXTRACTION W/PHACO Right 06/15/2016   Procedure: CATARACT EXTRACTION PHACO AND INTRAOCULAR LENS PLACEMENT (IOC);  Surgeon: Rutherford Guys, MD;  Location: [facility];  Service: Ophthalmology;  Laterality: Right;  CDE: 4.64  . CATARACT EXTRACTION W/PHACO Left 07/13/2016   Procedure: CATARACT EXTRACTION PHACO AND INTRAOCULAR LENS PLACEMENT (IOC);  Surgeon: Rutherford Guys, MD;  Location: [facility];  Service: Ophthalmology;  Laterality: Left;  CDE: 3.15  . COLONOSCOPY  12/28/2010   SLF: (MAC)Internal hemorrhoids/four small colon polyps tubular adenomas. per SLF: colonoscopy 2022  . COLONOSCOPY WITH PROPOFOL N/A 07/05/2017   Procedure: COLONOSCOPY WITH PROPOFOL;  Surgeon: Danie Binder, MD;  [facility];  Service: Endoscopy;  Laterality: N/A;  7:30am  . CORONARY ARTERY BYPASS GRAFT  2002   3 vessels  . ESOPHAGOGASTRODUODENOSCOPY N/A 05/30/2012   SLF: UNCONTROLLED GERD DUE TO LIFESTYLE CHOICE/WEIGHT GAIN/MILD Non-erosive gastritis  . ESOPHAGOGASTRODUODENOSCOPY (EGD) WITH PROPOFOL N/A 03/02/2016   Dr. Oneida Alar: Esophagus appeared normal, impaired dilation performed, patchy inflammation with edema and erythema of the entire stomach., Biopsy with H pylori, patient completed Pylera.   [name] Kitchen FRACTURE SURGERY    . LEFT HEART CATHETERIZATION WITH CORONARY ANGIOGRAM N/A 08/31/2011   Procedure: LEFT HEART CATHETERIZATION WITH CORONARY ANGIOGRAM;  Surgeon: Laverda Page, MD;  [facility] C[facility];  Service: Cardiovascular;  Laterality: N/A;  . LUMBAR DISC SURGERY     "L4-5; Dr. Trenton Gammon"  . MULTIPLE TOOTH EXTRACTIONS    . ORIF MANDIBULAR FRACTURE N/A 12/19/2015   Procedure: OPEN REDUCTION INTERNAL FIXATION (ORIF) MANDIBULAR FRACTURE;  Surgeon: Izora Gala, MD;  Location: [place];   Service: ENT;  Laterality: N/A;  . PATELLA FRACTURE SURGERY Left 1976   plate to knee cap from accident  . POLYPECTOMY  07/05/2017   Procedure: POLYPECTOMY;  Surgeon: Danie Binder, MD;  [facility];  Service: Endoscopy;;  colon  . SAVORY DILATION  12/28/2010   SLF:(MAC)J-shaped stomach/nodular mocosa in the distal esophagus/empiric dilation 27m  . SAVORY DILATION N/A 03/02/2016   Procedure: SAVORY DILATION;  Surgeon: SDanie Binder MD;  [facility];  Service: Endoscopy;  Laterality: N/A;     Current Meds  Medication Sig  . albuterol (PROVENTIL HFA;VENTOLIN HFA) 108 (90 Base) MCG/ACT inhaler Inhale 2 puffs into the lungs every 6 (six) hours as needed for wheezing or shortness of breath.   . alprazolam (XANAX) 2 MG tablet Take 2 mg by mouth 2 (two) times daily.  .[name] Kitchenaspirin EC 81 MG tablet Take 81 mg by mouth at bedtime.  .[name] Kitchen  atorvastatin (LIPITOR) 80 MG tablet Take 1 tablet (80 mg total) by mouth daily.  . beclomethasone (QVAR) 80 MCG/ACT inhaler Inhale 2 puffs into the lungs 2 (two) times daily.  . cetirizine (ZYRTEC) 10 MG tablet Take 10 mg by mouth daily as needed for allergies.   [name] Kitchen dexlansoprazole (DEXILANT) 60 MG capsule Take 1 capsule (60 mg total) by mouth daily.  . Evolocumab (REPATHA SURECLICK) 128 MG/ML SOAJ Inject 140 mg into the skin every 14 (fourteen) days.  . fluticasone (FLONASE) 50 MCG/ACT nasal spray Place 2 sprays into both nostrils daily as needed for allergies.   . furosemide (LASIX) 40 MG tablet Take 1.5 tablets (60 mg total) by mouth daily.  [name] Kitchen glipiZIDE (GLUCOTROL) 10 MG tablet Take 10 mg by mouth daily before breakfast.  . ipratropium (ATROVENT) 0.02 % nebulizer solution Take 0.5 mg by nebulization 2 (two) times daily as needed (for congestion).  [name] Kitchen LINZESS 290 MCG CAPS capsule TAKE 1 CAPSULE(290 MCG) BY MOUTH DAILY BEFORE BREAKFAST  . lisinopril (PRINIVIL,ZESTRIL) 10 MG tablet Take 10 mg by mouth daily.  . meclizine (ANTIVERT) 25 MG tablet Take  25 mg by mouth 3 (three) times daily as needed for dizziness.  . metFORMIN (GLUCOPHAGE) 500 MG tablet Take 1 tablet (500 mg total) by mouth daily with breakfast. (Patient taking differently: Take 500 mg by mouth 2 (two) times daily with a meal. )  . metoprolol tartrate (LOPRESSOR) 25 MG tablet Take 0.5 tablets (12.5 mg total) by mouth 2 (two) times daily.  . naloxegol oxalate (MOVANTIK) 25 MG TABS tablet Take 1 tablet (25 mg total) by mouth daily.  [name] Kitchen NARCAN 4 MG/0.1ML LIQD nasal spray kit Place 1 spray into the nose once.   . nitroGLYCERIN (NITROSTAT) 0.4 MG SL tablet SEE NOTES  . oxyCODONE-acetaminophen (PERCOCET) 10-325 MG tablet Take 1 tablet by mouth every 4 (four) hours as needed for pain.  . potassium chloride (K-DUR,KLOR-CON) 10 MEQ tablet Take 1 tablet (10 mEq total) by mouth daily.  . ranitidine (ZANTAC) 300 MG tablet take 1 tablet by mouth at bedtime  . sertraline (ZOLOFT) 100 MG tablet Take 100 mg by mouth daily.  . sitaGLIPtin (JANUVIA) 100 MG tablet Take 100 mg by mouth daily.  . tamsulosin (FLOMAX) 0.4 MG CAPS capsule Take 1 capsule (0.4 mg total) by mouth daily after breakfast.     Allergies:   Gabapentin, Ibuprofen, Zolpidem tartrate, and Naproxen   Social History   Tobacco Use  . Smoking status: Never Smoker  . Smokeless tobacco: Never Used  Substance Use Topics  . Alcohol use: No    Alcohol/week: 0.0 standard drinks  . 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 There is no history of Colon cancer, Liver disease, Anesthesia problems, Hypotension,  Malignant hyperthermia, Pseudochol deficiency, or Colon polyps.  ROS:   Please see the history of present illness.     All other systems reviewed and are negative.   Prior CV studies:   The following studies were reviewed today:  06/2017 echo Study Conclusions  - Left ventricle: The  cavity size was normal. Wall thickness was increased in a pattern of mild LVH. Systolic function was normal. The estimated ejection fraction was in the range of 60% to 65%.  12/2015 nuclear stress IMPRESSION: 1. No reversible ischemia or infarction.  2. Normal left ventricular wall motion.  3. Left ventricular ejection fraction is 54%.  4. Non invasive risk stratification*: Low   LHC (08/31/11, Dr. Einar Gip): LMCA normal. LAD normal. LAD normal. RCA tortuous but otherwise normal. LVEF 55-60%. LVEDP 19 mmHg. Patent left subclavian artery stent with atretic LIMA.  LHC (10/24/08): LMCA normal. LAD with midvessel bridge and 40% stenosis in systole. Ostial LAD with 30% stenosis. LCx with mild luminal irregularities. RCA with mild luminal irregularities. Patent left subclavian artery stent. LIMA -> LAD proximally occluded.  Labs/Other Tests and Data Reviewed:    EKG:  No ECG reviewed.  Recent Labs: 12/26/2017: ALT 25; BUN 8; Creat 0.91; Magnesium 2.3; Potassium 3.8; Sodium 139   Recent Lipid Panel Lab Results  Component Value Date/Time   CHOL 72 12/26/2017 11:12 AM   CHOL 196 09/20/2017 08:07 AM   TRIG 193 (H) 12/26/2017 11:12 AM   HDL 36 (L) 12/26/2017 11:12 AM   HDL 39 (L) 09/20/2017 08:07 AM   CHOLHDL 2.0 12/26/2017 11:12 AM   LDLCALC 10 12/26/2017 11:12 AM    Wt Readings from Last 3 Encounters:  10/23/18 264 lb (119.7 kg)  09/06/18 267 lb 6.4 oz (121.3 kg)  06/20/18 263 lb (119.3 kg)     Objective:    Vital Signs:  BP 123/80   Pulse 64   Ht _0  (1.778 m)   Wt 264 lb (119.7 kg)   BMI 37.88 kg/m    Normal affect. Normal speech pattern and tone. Comfortable, no apparent  distress. No audible signs of SOB or wheezing.   ASSESSMENT & PLAN:    1. CAD  -recent chest pain symptoms - we will obtain a lexiscan to further evaluate - if additional antianginal therapy needed would try norvasc, had side effects on ranexa and and imdur  2. Hyperlipidemia - continues statin and repatha, upcoming labs with pcp  3. Chronic heart failure with preserved EF - no recent symptoms, continue current meds  4. HTN - he is at goal, continue current meds   COVID-19 Education: The signs and symptoms of COVID-19 were discussed with the patient and how to seek care for testing (follow up with PCP or arrange E-visit).  The importance of social distancing was discussed today.  Time:   Today, I have spent 22 minutes with the patient with telehealth technology discussing the above problems.     Medication Adjustments/Labs and Tests Ordered: Current medicines are reviewed at length with the patient today.  Concerns regarding medicines are outlined above.   Tests Ordered: No orders of the defined types were placed in this encounter.   Medication Changes: No orders of the defined types were placed in this encounter.   Follow Up:  Virtual Visit in 6 week(s)  Signed, Carlyle Dolly, MD  10/23/2018 10:59 AM    [place]

## 2018-10-23 NOTE — Telephone Encounter (Signed)
Carlton Adam has been re-scheduled to Wednesday 11-01-2018 at [facility].

## 2018-10-30 ENCOUNTER — Encounter (HOSPITAL_COMMUNITY): Payer: Medicare Other

## 2018-11-01 ENCOUNTER — Encounter (HOSPITAL_COMMUNITY): Payer: Self-pay

## 2018-11-01 ENCOUNTER — Other Ambulatory Visit: Payer: Self-pay | Admitting: Gastroenterology

## 2018-11-01 ENCOUNTER — Encounter (HOSPITAL_BASED_OUTPATIENT_CLINIC_OR_DEPARTMENT_OTHER)
Admission: RE | Admit: 2018-11-01 | Discharge: 2018-11-01 | Disposition: A | Discharge status: Home / Self Care | Payer: Medicare Other | Source: Ambulatory Visit | Attending: Cardiology | Admitting: Cardiology

## 2018-11-01 ENCOUNTER — Other Ambulatory Visit: Payer: Self-pay

## 2018-11-01 ENCOUNTER — Encounter (HOSPITAL_COMMUNITY)
Admission: RE | Admit: 2018-11-01 | Discharge: 2018-11-01 | Disposition: A | Discharge status: Home / Self Care | Payer: Medicare Other | Source: Ambulatory Visit | Attending: Cardiology | Admitting: Cardiology

## 2018-11-01 DIAGNOSIS — R079 Chest pain, unspecified: Secondary | ICD-10-CM | POA: Insufficient documentation

## 2018-11-01 DIAGNOSIS — K5900 Constipation, unspecified: Secondary | ICD-10-CM

## 2018-11-01 HISTORY — DX: Unspecified asthma, uncomplicated: J45.909

## 2018-11-01 LAB — NM MYOCAR MULTI W/SPECT W/WALL MOTION / EF
LV dias vol: 85 mL (ref 62–150)
LV sys vol: 33 mL
Peak HR: 85 {beats}/min
RATE: 0.29
Rest HR: 64 {beats}/min
SDS: 0
SRS: 6
SSS: 6
TID: 1.21

## 2018-11-01 MED ORDER — TECHNETIUM TC 99M TETROFOSMIN IV KIT
10.0000 | PACK | Freq: Once | INTRAVENOUS | Status: AC | PRN
  Administered 2018-11-01: 10.5 via INTRAVENOUS

## 2018-11-01 MED ORDER — SODIUM CHLORIDE 0.9% FLUSH
INTRAVENOUS | Status: AC
  Administered 2018-11-01: 10 mL via INTRAVENOUS
  Filled 2018-11-01: qty 10

## 2018-11-01 MED ORDER — TECHNETIUM TC 99M TETROFOSMIN IV KIT
30.0000 | PACK | Freq: Once | INTRAVENOUS | Status: AC | PRN
  Administered 2018-11-01: 32.8 via INTRAVENOUS

## 2018-11-01 MED ORDER — REGADENOSON 0.4 MG/5ML IV SOLN
INTRAVENOUS | Status: AC
  Administered 2018-11-01: 0.4 mg via INTRAVENOUS
  Filled 2018-11-01: qty 5

## 2018-11-07 ENCOUNTER — Encounter: Payer: Self-pay | Admitting: Nurse Practitioner

## 2018-11-07 ENCOUNTER — Other Ambulatory Visit: Payer: Self-pay

## 2018-11-07 ENCOUNTER — Ambulatory Visit (INDEPENDENT_AMBULATORY_CARE_PROVIDER_SITE_OTHER): Payer: Medicare Other | Admitting: Nurse Practitioner

## 2018-11-07 VITALS — BP 138/85 | HR 69 | Temp 96.6°F | Ht 70.0 in | Wt 279.4 lb

## 2018-11-07 DIAGNOSIS — K5900 Constipation, unspecified: Secondary | ICD-10-CM

## 2018-11-07 DIAGNOSIS — R1030 Lower abdominal pain, unspecified: Secondary | ICD-10-CM

## 2018-11-07 NOTE — Progress Notes (Signed)
Referring Provider: Nolene Ebbs, MD Primary Care Physician:  Nolene Ebbs, MD Primary GI:  Dr. Oneida Alar  Chief Complaint  Patient presents with  . Constipation  . Bloated  . Abdominal Pain    lower abd and back    HPI:   Dalton [name] is a 58 y.o. male who presents for follow-up regarding abdominal pain.  The patient was last seen in our office 09/06/2018 for the same as well as constipation.  History of tubular adenoma on colonoscopy and next due in 2020.  Previously tried and failed Amitiza and mag citrate for constipation.  Linzess not very effective and dose was increased to 290 mcg, which also did not help and required mag citrate which resulted in a large bowel movement in addition to vomiting.  Attempted Movantik but prior Auth required trial of another medication so he was started on Relistor 450 mg daily.  At his last visit he states Relistor was not paid for even though it was 1 of the options insurance stated he needed to try prior to taking Movantik.  Currently taking Linzess 290 mcg and needs to take 4 of them to have a bowel movement, occasional mag citrate.  GERD flares 2-3 times a week currently on Dexilant.  Abdominal pain with constipation which improves after a bowel movement.  No other GI complaints.  Recommended continue Linzess, add Colace over-the-counter, follow-up in 2 months.  Further contact with insurance and indicated Amitiza was covered at which point they were informed it was tried and failed.  Also recommended use of lactulose.  Today he states he's not doing well. He called insurance and was told they would send [place] recommendations and that someone from insurance would call him back, but they never did. We do have Movantik samples. He is currently requiring multiple Linzess pills a day and intermittent MgCitrate. Having a bowel movement about every 2-3 days after he takes multiple medications. Abdominal cramping improves after a bowel movement. Denies  hematochezia, melena, fever, chills, unintentional weight loss. He is gaining weight. Some nausea due likely to constipation and resultant poor appetite. Denies URI or flu-like symptoms. Denies loss of sense of taste or smell. Denies chest pain, dyspnea, dizziness, lightheadedness, syncope, near syncope. Denies any other upper or lower GI symptoms.  He is on pain medications currently.   Past Medical History:  Diagnosis Date  . Anemia   . Anxiety   . Asthma   . Bipolar disorder ([place])   . CHF (congestive heart failure) ([place])   . Chronic back pain    Pain Clinic in [place]  . Chronic bronchitis   . Chronic lower back pain   . Congestive heart failure (CHF) ([place])   . Coronary artery disease   . Coughing   . Depression   . DVT (deep venous thrombosis) ([place]) ~ 2005   LLE  . Frequency of urination   . GERD (gastroesophageal reflux disease)   . 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 FEXMDYJW(929.5)    "a few times/week" (12/24/2015)  . High cholesterol   . HNP (herniated nucleus pulposus), cervical   . Hypertension   . Laceration of right hand 11/27/2010  . Laceration of wrist 2007 BIL FOREARMS  . MI (myocardial infarction) ([place])    7, last one was in 2011 (12/24/2015)  . Neuropathy   . NSAID-induced gastric ulcer    "Ibuprofen"  . PUD (peptic ulcer  disease)    in 1990s, secondary to medication  . Rheumatoid arthritis ([place])    "all over" (12/24/2015)  . Shortness of breath    with exertion  . Tonsillitis, chronic    Dr. Vicki Mallet in [place]  . Type II diabetes mellitus ([place])   . Wears glasses     Past Surgical History:  Procedure Laterality Date  . ANTERIOR CERVICAL DECOMP/DISCECTOMY FUSION N/A 11/05/2016   Procedure: Anterior Cervical Discectomy and Fusion - Cervical three-Cervical four;  Surgeon: Earnie Larsson, MD;  Location: [place];  Service: Neurosurgery;  Laterality: N/A;  . BACK SURGERY     x3   . BIOPSY N/A 05/30/2012   Procedure: BIOPSY;  Surgeon: Danie Binder, MD;  Location: [facility];  Service: Endoscopy;  Laterality: N/A;  . BIOPSY  03/02/2016   Procedure: BIOPSY;  Surgeon: Danie Binder, MD;  [facility];  Service: Endoscopy;;  gastric  . CARDIAC CATHETERIZATION  "several"  . CARPAL TUNNEL RELEASE Bilateral   . CATARACT EXTRACTION W/PHACO Right 06/15/2016   Procedure: CATARACT EXTRACTION PHACO AND INTRAOCULAR LENS PLACEMENT (IOC);  Surgeon: Rutherford Guys, MD;  Location: [facility];  Service: Ophthalmology;  Laterality: Right;  CDE: 4.64  . CATARACT EXTRACTION W/PHACO Left 07/13/2016   Procedure: CATARACT EXTRACTION PHACO AND INTRAOCULAR LENS PLACEMENT (IOC);  Surgeon: Rutherford Guys, MD;  Location: [facility];  Service: Ophthalmology;  Laterality: Left;  CDE: 3.15  . COLONOSCOPY  12/28/2010   SLF: (MAC)Internal hemorrhoids/four small colon polyps tubular adenomas. per SLF: colonoscopy 2022  . COLONOSCOPY WITH PROPOFOL N/A 07/05/2017   Procedure: COLONOSCOPY WITH PROPOFOL;  Surgeon: Danie Binder, MD;  [facility];  Service: Endoscopy;  Laterality: N/A;  7:30am  . CORONARY ARTERY BYPASS GRAFT  2002   3 vessels  . ESOPHAGOGASTRODUODENOSCOPY N/A 05/30/2012   SLF: UNCONTROLLED GERD DUE TO LIFESTYLE CHOICE/WEIGHT GAIN/MILD Non-erosive gastritis  . ESOPHAGOGASTRODUODENOSCOPY (EGD) WITH PROPOFOL N/A 03/02/2016   Dr. Oneida Alar: Esophagus appeared normal, impaired dilation performed, patchy inflammation with edema and erythema of the entire stomach., Biopsy with H pylori, patient completed Pylera.   [name] Kitchen FRACTURE SURGERY    . LEFT HEART CATHETERIZATION WITH CORONARY ANGIOGRAM N/A 08/31/2011   Procedure: LEFT HEART CATHETERIZATION WITH CORONARY ANGIOGRAM;  Surgeon: Laverda Page, MD;  Location: [facility] CATH LAB;  Service: Cardiovascular;  Laterality: N/A;  . LUMBAR DISC SURGERY     "L4-5; Dr. Trenton Gammon"  . MULTIPLE TOOTH EXTRACTIONS    . ORIF MANDIBULAR FRACTURE N/A 12/19/2015   Procedure:  OPEN REDUCTION INTERNAL FIXATION (ORIF) MANDIBULAR FRACTURE;  Surgeon: Izora Gala, MD;  Location: [place];  Service: ENT;  Laterality: N/A;  . PATELLA FRACTURE SURGERY Left 1976   plate to knee cap from accident  . POLYPECTOMY  07/05/2017   Procedure: POLYPECTOMY;  Surgeon: Danie Binder, MD;  [facility];  Service: Endoscopy;;  colon  . SAVORY DILATION  12/28/2010   SLF:(MAC)J-shaped stomach/nodular mocosa in the distal esophagus/empiric dilation 26m  . SAVORY DILATION N/A 03/02/2016   Procedure: SAVORY DILATION;  Surgeon: SDanie Binder MD;  [facility];  Service: Endoscopy;  Laterality: N/A;    Current Outpatient Medications  Medication Sig Dispense Refill  . albuterol (PROVENTIL HFA;VENTOLIN HFA) 108 (90 Base) MCG/ACT inhaler Inhale 2 puffs into the lungs every 6 (six) hours as needed for wheezing or shortness of breath.     . alprazolam (XANAX) 2 MG tablet Take 2 mg by mouth 2 (two) times daily.    .[name] Kitchen  aspirin EC 81 MG tablet Take 81 mg by mouth at bedtime.    [name] Kitchen atorvastatin (LIPITOR) 80 MG tablet Take 1 tablet (80 mg total) by mouth daily. 90 tablet 3  . beclomethasone (QVAR) 80 MCG/ACT inhaler Inhale 2 puffs into the lungs 2 (two) times daily.    . cetirizine (ZYRTEC) 10 MG tablet Take 10 mg by mouth daily as needed for allergies.   0  . dexlansoprazole (DEXILANT) 60 MG capsule Take 1 capsule (60 mg total) by mouth daily. 30 capsule 5  . Evolocumab (REPATHA SURECLICK) 852 MG/ML SOAJ Inject 140 mg into the skin every 14 (fourteen) days. 2 pen 11  . fluticasone (FLONASE) 50 MCG/ACT nasal spray Place 2 sprays into both nostrils daily as needed for allergies.     . furosemide (LASIX) 40 MG tablet Take 1.5 tablets (60 mg total) by mouth daily. 135 tablet 1  . glipiZIDE (GLUCOTROL) 10 MG tablet Take 10 mg by mouth daily before breakfast.    . ipratropium (ATROVENT) 0.02 % nebulizer solution Take 0.5 mg by nebulization 2 (two) times daily as needed (for congestion).    [name] Kitchen  LINZESS 290 MCG CAPS capsule TAKE 1 CAPSULE(290 MCG) BY MOUTH DAILY BEFORE BREAKFAST (Patient taking differently: Takes 4-5 capsules per day) 30 capsule 5  . lisinopril (PRINIVIL,ZESTRIL) 10 MG tablet Take 10 mg by mouth daily.    . magnesium citrate SOLN Take 1 Bottle by mouth as needed for severe constipation.    . meclizine (ANTIVERT) 25 MG tablet Take 25 mg by mouth 3 (three) times daily as needed for dizziness.    . metFORMIN (GLUCOPHAGE) 500 MG tablet Take 1 tablet (500 mg total) by mouth daily with breakfast. (Patient taking differently: Take 500 mg by mouth 2 (two) times daily with a meal. ) 30 tablet 0  . metoprolol tartrate (LOPRESSOR) 25 MG tablet Take 0.5 tablets (12.5 mg total) by mouth 2 (two) times daily. 30 tablet 0  . NARCAN 4 MG/0.1ML LIQD nasal spray kit Place 1 spray into the nose once.   0  . nitroGLYCERIN (NITROSTAT) 0.4 MG SL tablet SEE NOTES 100 tablet 3  . oxyCODONE-acetaminophen (PERCOCET) 10-325 MG tablet Take 1 tablet by mouth every 4 (four) hours as needed for pain.    . potassium chloride (K-DUR,KLOR-CON) 10 MEQ tablet Take 1 tablet (10 mEq total) by mouth daily. 30 tablet 0  . sertraline (ZOLOFT) 100 MG tablet Take 100 mg by mouth daily.    . sitaGLIPtin (JANUVIA) 100 MG tablet Take 100 mg by mouth daily.    . tamsulosin (FLOMAX) 0.4 MG CAPS capsule Take 1 capsule (0.4 mg total) by mouth daily after breakfast. 30 capsule 0   No current facility-administered medications for this visit.     Allergies as of 11/07/2018 - Review Complete 11/07/2018  Allergen Reaction Noted  . Gabapentin Hives   . Ibuprofen Other (See Comments)   . Zolpidem tartrate Other (See Comments)   . Naproxen Other (See Comments)     Family History  Problem Relation Age of Onset  . Diabetes Mother   . Hypertension Mother   . Heart attack Mother 35  . Hypertension Father   . Diabetes Father   . Heart attack Father 22  . Heart attack Other        mother, father, brother, sister all  deceased due to MI  . Heart attack Sister   . Heart attack Brother   . Seizures Brother   . Heart failure Other   .  Colon cancer Neg Hx   . Liver disease Neg Hx   . Anesthesia problems Neg Hx   . Hypotension Neg Hx   . Malignant hyperthermia Neg Hx   . Pseudochol deficiency Neg Hx   . Colon polyps Neg Hx     Social History   Socioeconomic History  . Marital status: Single    Spouse name: Not on file  . Number of children: 2  . Years of education: Not on file  . Highest education level: Not on file  Occupational History  . Occupation: disabled    Fish farm manager: NOT EMPLOYED  Social Needs  . Financial resource strain: Not on file  . Food insecurity    Worry: Not on file    Inability: Not on file  . Transportation needs    Medical: Not on file    Non-medical: Not on file  Tobacco Use  . Smoking status: Never Smoker  . Smokeless tobacco: Never Used  Substance and Sexual Activity  . Alcohol use: No    Alcohol/week: 0.0 standard drinks  . 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 Sexual activity: Not Currently  Lifestyle  . Physical activity    Days per week: Not on file    Minutes per session: Not on file  . Stress: Not on file  Relationships  . Social Herbalist on phone: Not on file    Gets together: Not on file    Attends religious service: Not on file    Active member of club or organization: Not on file    Attends meetings of clubs or organizations: Not on file    Relationship status: Not on file  Other Topics Concern  . Not on file  Social History Narrative   Lives w/ son-23/22    Review of Systems: Complete ROS negative except as per HPI.   Physical Exam: BP 138/85   Pulse 69   Temp (!) 96.6 F (35.9 C) (Temporal)   Ht _0  (1.778 m)   Wt 279 lb 6.4 oz (126.7 kg)   BMI 40.09 kg/m  General:   Alert and oriented. Pleasant and cooperative. Well-nourished and  well-developed.  Eyes:  Without icterus, sclera clear and conjunctiva pink.  Ears:  Normal auditory acuity. Cardiovascular:  S1, S2 present without murmurs appreciated. Extremities without clubbing or edema. Respiratory:  Clear to auscultation bilaterally. No wheezes, rales, or rhonchi. No distress.  Gastrointestinal:  +BS, soft, non-tender and non-distended. No HSM noted. No guarding or rebound. No masses appreciated.  Rectal:  Deferred  Musculoskalatal:  Symmetrical without gross deformities. Neurologic:  Alert and oriented x4;  grossly normal neurologically. Psych:  Alert and cooperative. Normal mood and affect. Heme/Lymph/Immune: No excessive bruising noted.    11/07/2018 8:40 AM   Disclaimer: This note was dictated with voice recognition software. Similar sounding words can inadvertently be transcribed and may not be corrected upon review.

## 2018-11-07 NOTE — Patient Instructions (Signed)
Your health issues we discussed today were:   Constipation: 1. I am giving you samples of Movantik 25 mg.  Take this once a day 2. If you stop taking pain medications, stop taking Movantik 3. Call us in 4 to 7 days and let us know if it is helping 4. When you initially start Movantik stop taking your other constipation medications 5. Call us if you have any worsening or severe symptoms   Overall I recommend:  1. Continue your other current medications, except as per stated above 2. Return for follow-up in 6 weeks 3. Call us if you have any questions or concerns 4. At your next appointment we will discuss your need for an updated colonoscopy   Because of recent events of COVID-19 ("Coronavirus"), follow CDC recommendations:  1. Wash your hand frequently 2. Avoid touching your face 3. Stay away from people who are sick 4. If you have symptoms such as fever, cough, shortness of breath then call your healthcare provider for further guidance 5. If you are sick, STAY AT HOME unless otherwise directed by your healthcare provider. 6. Follow directions from state and national officials regarding staying safe   At [facility] we value your feedback. You may receive a survey about your visit today. Please share your experience as we strive to create trusting relationships with our patients to provide genuine, compassionate, quality care.  We appreciate your understanding and patience as we review any laboratory studies, imaging, and other diagnostic tests that are ordered as we care for you. Our office policy is 5 business days for review of these results, and any emergent or urgent results are addressed in a timely manner for your best interest. If you do not hear from our office in 1 week, please contact us.   We also encourage the use of MyChart, which contains your medical information for your review as well. If you are not enrolled in this feature, an access code is on this  after visit summary for your convenience. Thank you for allowing [place] to be involved in your care.  It was great to see you today!  I hope you have a great Fall!!

## 2018-11-07 NOTE — Assessment & Plan Note (Signed)
Persistent abdominal pain in the setting of significant constipation.  Pain typically resolves after a bowel movement.  His abdominal pain is likely due to his ongoing constipation.  I feel better management of his constipation will improve his symptoms.  Further constipation recommendations as per above.

## 2018-11-07 NOTE — Assessment & Plan Note (Addendum)
Worsening constipation.  The patient is tried multiple medications.  Prior Auth for Movantik was denied and recommended trial of Relistor.  Relistor prescription was written and insurance denied paying for the medication they recommended [place] to try.  Most recent communication they recommended, given his trial of multiple other options, to try lactulose.  The patient is currently on pain medication.  We do have samples of Movantik and I will get the student to see if it notes an improvement.  If so, we will find with the insurance company to get it covered.  We can try a brief course of Linzess for effectiveness if needed.  Return for follow-up in 6 weeks.  We will address his needed colonoscopy this year at his follow-up visit.

## 2018-11-08 ENCOUNTER — Telehealth: Payer: Self-pay | Admitting: *Deleted

## 2018-11-08 NOTE — Telephone Encounter (Signed)
Pt says he still has some chest discomfort - voice understanding of results

## 2018-11-08 NOTE — Telephone Encounter (Signed)
-----   Message from Arnoldo Lenis, MD sent at 11/08/2018 12:08 PM EDT ----- Stress test shows just a small area suggesting a possibly small blockage. Its small enough that it is not considered to be of significant risk but could cause some symptoms from time to time. Is he still having chest pain? If so we may adjust some medicatoins   Zandra Abts MD

## 2018-11-09 NOTE — Telephone Encounter (Signed)
Can he start norvasc 2.5 mg daily to see if helps with chest pain. We will reevaluate at our f/u in October   J Kalei Mckillop MD

## 2018-11-15 MED ORDER — AMLODIPINE BESYLATE 2.5 MG PO TABS: 2.5000 mg | ORAL_TABLET | Freq: Every day | ORAL | 1 refills | Status: DC

## 2018-11-15 NOTE — Telephone Encounter (Signed)
Pt aware and medication sent to pharmacy.  

## 2018-12-04 ENCOUNTER — Telehealth: Payer: Self-pay | Admitting: Cardiology

## 2018-12-04 NOTE — Telephone Encounter (Signed)

## 2018-12-07 ENCOUNTER — Encounter: Payer: Self-pay | Admitting: Cardiology

## 2018-12-07 ENCOUNTER — Telehealth (INDEPENDENT_AMBULATORY_CARE_PROVIDER_SITE_OTHER): Payer: Medicare Other | Admitting: Cardiology

## 2018-12-07 VITALS — BP 135/96 | HR 68 | Ht 68.0 in | Wt 263.0 lb

## 2018-12-07 DIAGNOSIS — I25118 Atherosclerotic heart disease of native coronary artery with other forms of angina pectoris: Secondary | ICD-10-CM | POA: Diagnosis not present

## 2018-12-07 MED ORDER — FUROSEMIDE 40 MG PO TABS: 40.0000 mg | ORAL_TABLET | Freq: Every day | ORAL | 1 refills | Status: DC

## 2018-12-07 MED ORDER — AMLODIPINE BESYLATE 5 MG PO TABS: 5.0000 mg | ORAL_TABLET | Freq: Every day | ORAL | 1 refills | Status: DC

## 2018-12-07 NOTE — Patient Instructions (Signed)
Your physician recommends that you schedule a follow-up appointment in: [place] has recommended you make the following change in your medication:   INCREASE AMLODIPINE 5 MG DAILY   DECREASE LASIX 40 MG DAILY   Thank you for choosing [facility]!!

## 2018-12-07 NOTE — Progress Notes (Signed)
Virtual Visit via Telephone Note   This visit type was conducted due to national recommendations for restrictions regarding the COVID-19 Pandemic (e.g. social distancing) in an effort to limit this patient's exposure and mitigate transmission in our community.  Due to his co-morbid illnesses, this patient is at least at moderate risk for complications without adequate follow up.  This format is felt to be most appropriate for this patient at this time.  The patient did not have access to video technology/had technical difficulties with video requiring transitioning to audio format only (telephone).  All issues noted in this document were discussed and addressed.  No physical exam could be performed with this format.  Please refer to the patient's chart for his  consent to telehealth for [facility].   Date:  12/07/2018   ID:  Dalton [name], DOB 1960/09/14, MRN [id number]  Patient Location: Home Provider Location: Office  PCP:  Nolene Ebbs, MD  Cardiologist:  Carlyle Dolly, MD  Electrophysiologist:  None   Evaluation Performed:  Follow-Up Visit  Chief Complaint:  Follow up  History of Present Illness:    Dalton [name] is a 58 y.o. male seen today for the following medical problems.This is a focused visit on history of CAD and recent chest pain symptoms   1. CAD with stable angina - history of CABG LIMA-LAD - last cath 2013 showed atretic LIMA, normal coronaries   - antianginal therapy limited by side effects. Fatigue on ranexa, headaches on imdur    - has had some recent chest pain. 5-6 episodes over the 2-3 months - can occur with activity or at rest. "strong pain", left of chest. 8/10 in severity, can get sweaty. Better with certain positions. Can have some SOB, can have some nausea.  - pain lasts 10-15 mintues, can be better with nitro at times - similar to prior chest pains.   10/2018 nuclear stress: small mid to distal anterior infarction with mild  peri-infarct ischemia, low risk.  -started norvasc 2.21m daily as additionala antiagninal, prior side effects on imdur and ranexa  - chest pain stable since last visit. Some orthostatic symptoms since starting norvasc. Reports adequate hydration.    The patient does not have symptoms concerning for COVID-19 infection (fever, chills, cough, or new shortness of breath).    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, last seizure in 2011;unknown etiology-pt sts heriditary (12/24/2015)   HDGLOVFIE(332.9    "a few times/week" (12/24/2015)   High cholesterol    HNP (herniated nucleus pulposus), cervical    Hypertension    Laceration of right hand 11/27/2010   Laceration of wrist 2007 BIL FOREARMS   [place]    7, last one was in 2011 (12/24/2015)   Neuropathy    NSAID-induced gastric ulcer    "Ibuprofen"   PUD (peptic ulcer disease)    in  1990s, secondary to medication   Rheumatoid arthritis ([place])    "all over" (12/24/2015)   Shortness of breath    with exertion   Tonsillitis, chronic    Dr. Vicki Mallet in [place]   Type II diabetes mellitus [facility])    Wears glasses    Past Surgical History:  Procedure Laterality Date   ANTERIOR CERVICAL DECOMP/DISCECTOMY FUSION N/A 11/05/2016   Procedure: Anterior Cervical Discectomy and Fusion - Cervical three-Cervical four;  Surgeon: Earnie Larsson, MD;  Location: [place];  Service: Neurosurgery;  Laterality: N/A;   BACK SURGERY     x3   BIOPSY  N/A 05/30/2012   Procedure: BIOPSY;  Surgeon: Danie Binder, MD;  [facility];  Service: Endoscopy;  Laterality: N/A;   BIOPSY  03/02/2016   Procedure: BIOPSY;  Surgeon: Danie Binder, MD;  [facility];  Service: Endoscopy;;  gastric   CARDIAC CATHETERIZATION  "several"   CARPAL TUNNEL RELEASE Bilateral    CATARACT EXTRACTION W/PHACO Right 06/15/2016   Procedure: CATARACT EXTRACTION PHACO AND INTRAOCULAR LENS PLACEMENT ([place]);  Surgeon: Rutherford Guys, MD;  Location: [facility];  Service: Ophthalmology;  Laterality: Right;  CDE: 4.64   CATARACT EXTRACTION W/PHACO Left 07/13/2016   Procedure: CATARACT EXTRACTION PHACO AND INTRAOCULAR LENS PLACEMENT ([facility]);  Surgeon: Rutherford Guys, MD;  [facility];  Service: Ophthalmology;  Laterality: Left;  CDE: 3.15   COLONOSCOPY  12/28/2010   SLF: (MAC)Internal hemorrhoids/four small colon polyps tubular adenomas. per SLF: colonoscopy 2022   COLONOSCOPY WITH PROPOFOL N/A 07/05/2017   Procedure: COLONOSCOPY WITH PROPOFOL;  Surgeon: Danie Binder, MD;  [facility];  Service: Endoscopy;  Laterality: N/A;  7:30am   CORONARY ARTERY BYPASS GRAFT  2002   3 vessels   ESOPHAGOGASTRODUODENOSCOPY N/A 05/30/2012   SLF: UNCONTROLLED GERD DUE TO LIFESTYLE CHOICE/WEIGHT GAIN/MILD Non-erosive gastritis   ESOPHAGOGASTRODUODENOSCOPY (EGD) WITH PROPOFOL N/A 03/02/2016   Dr. Oneida Alar: Esophagus appeared normal, impaired dilation performed, patchy inflammation with edema and erythema of the entire stomach., Biopsy with H pylori, patient completed Pylera.    FRACTURE SURGERY     LEFT HEART CATHETERIZATION WITH CORONARY ANGIOGRAM N/A 08/31/2011   Procedure: LEFT HEART CATHETERIZATION WITH CORONARY ANGIOGRAM;  Surgeon: Laverda Page, MD;  Location[facility] C[facility];  Service: Cardiovascular;  Laterality: N/A;   LUMBAR DISC SURGERY     "L4-5; Dr. Trenton Gammon"   MULTIPLE TOOTH EXTRACTIONS     ORIF MANDIBULAR FRACTURE N/A 12/19/2015   Procedure: OPEN  REDUCTION INTERNAL FIXATION (ORIF) MANDIBULAR FRACTURE;  Surgeon: Izora Gala, MD;  Location: [place];  Service: ENT;  Laterality: N/A;   PATELLA FRACTURE SURGERY Left 1976   plate to knee cap from accident   POLYPECTOMY  07/05/2017   Procedure: POLYPECTOMY;  Surgeon: Danie Binder, MD;  [facility];  Service: Endoscopy;;  colon   SAVORY DILATION  12/28/2010   SLF:(MAC)J-shaped stomach/nodular mocosa in the distal esophagus/empiric dilation 22m   SAVORY DILATION N/A 03/02/2016   Procedure: SAVORY DILATION;  Surgeon: SDanie Binder MD;  [facility];  Service: Endoscopy;  Laterality: N/A;     No outpatient medications have been marked as taking for the 12/07/18 encounter (Appointment) with BArnoldo Lenis MD.     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 There is no history of Colon cancer, Liver disease, Anesthesia problems, Hypotension, Malignant hyperthermia, Pseudochol deficiency, or Colon polyps.  ROS:   Please see the history of present illness.     All other systems reviewed and are negative.   Prior CV studies:   The following studies were reviewed today:  06/2017 echo Study Conclusions  - Left ventricle: The cavity size was normal. Wall thickness was increased in a pattern of  mild LVH. Systolic function was normal. The estimated ejection fraction was in the range of 60% to 65%.  12/2015 nuclear stress IMPRESSION: 1. No reversible ischemia or infarction.  2. Normal left ventricular wall motion.  3. Left ventricular ejection fraction is 54%.  4. Non invasive risk stratification*: Low   LHC (08/31/11, Dr. Einar Gip): LMCA normal. LAD normal. LAD normal. RCA tortuous but otherwise normal. LVEF 55-60%. LVEDP 19 mmHg. Patent left subclavian artery stent with atretic LIMA.  LHC (10/24/08): LMCA normal. LAD with midvessel bridge and 40% stenosis in systole. Ostial LAD with 30% stenosis. LCx with mild luminal irregularities. RCA with mild luminal irregularities. Patent left subclavian artery stent. LIMA -> LAD proximally occluded.   10/2018 nuclear stress  There was no ST segment deviation noted during stress.  Findings consistent with small prior mid to distal anterior myocardial infarction with mild peri-infarct ischemia.  This is a low risk study.  The left ventricular ejection fraction is normal (55-65%).    Labs/Other Tests and Data Reviewed:    EKG:  No ECG reviewed.  Recent Labs: 12/26/2017: ALT 25; BUN 8; Creat 0.91; Magnesium 2.3; Potassium 3.8; Sodium 139   Recent Lipid Panel Lab Results  Component Value Date/Time   CHOL 72 12/26/2017 11:12 AM   CHOL 196 09/20/2017 08:07 AM   TRIG 193 (H) 12/26/2017 11:12 AM   HDL 36 (L) 12/26/2017 11:12 AM   HDL 39 (L) 09/20/2017 08:07 AM   CHOLHDL 2.0 12/26/2017 11:12 AM   LDLCALC 10 12/26/2017 11:12 AM    Wt Readings from Last 3 Encounters:  11/07/18 279 lb 6.4 oz (126.7 kg)  10/23/18 264 lb (119.7 kg)  09/06/18 267 lb 6.4 oz (121.3 kg)     Objective:    Vital Signs:   Today's Vitals   12/07/18 0920  BP: (!) 135/96  Pulse: 68  Weight: 263 lb (119.3 kg)  Height: _0  (1.727 m)   Body mass index is 39.99 kg/m. Normal affect. Normal speech pattern and tone. Comfortable, no apparent  distress. No audible signs of SOB or wheezing.  ASSESSMENT & PLAN:    1. CADwith stable angina.  -stable symptoms, recent stress test mild peri-infarct ischemia low risk - limitations on antianginal therapy as listed above, will increase norvasc to 52m daily. Due to some orthostatic symptoms lower lasix to 427mdaily.     COVID-19 Education: The signs and symptoms of COVID-19 were discussed with the patient and how to seek care for testing (follow up with PCP or arrange  E-visit).  The importance of social distancing was discussed today.  Time:   Today, I have spent 16 minutes with the patient with telehealth technology discussing the above problems.     Medication Adjustments/Labs and Tests Ordered: Current medicines are reviewed at length with the patient today.  Concerns regarding medicines are outlined above.   Tests Ordered: No orders of the defined types were placed in this encounter.   Medication Changes: No orders of the defined types were placed in this encounter.   Follow Up:  Virtual Visit  in 6 week(s)  Signed, Carlyle Dolly, MD  12/07/2018 8:49 AM    [facility]

## 2018-12-13 ENCOUNTER — Telehealth: Payer: Self-pay | Admitting: Gastroenterology

## 2018-12-13 DIAGNOSIS — T402X5A Adverse effect of other opioids, initial encounter: Secondary | ICD-10-CM

## 2018-12-13 DIAGNOSIS — K5903 Drug induced constipation: Secondary | ICD-10-CM

## 2018-12-13 NOTE — Telephone Encounter (Signed)
Pt has questions about the samples he was taking and if he needs a prescription. I asked him the name of the sample he was taking and he said he thought it was called Melbutrix ? Please advise and call him at [phone]

## 2018-12-15 MED ORDER — NALOXEGOL OXALATE 25 MG PO TABS: 25.0000 mg | ORAL_TABLET | Freq: Every day | ORAL | 3 refills | Status: DC

## 2018-12-15 NOTE — Telephone Encounter (Signed)
Rx sent to pharmacy per patient request. 

## 2018-12-15 NOTE — Telephone Encounter (Signed)
PT said the Movantik worked great. He wants Randall Hiss to send in a prescription to the pharmacy so we can work on doing a PA for it.

## 2018-12-18 NOTE — Telephone Encounter (Signed)
PT is aware.

## 2018-12-21 ENCOUNTER — Ambulatory Visit: Payer: Medicare Other | Admitting: Nurse Practitioner

## 2018-12-21 NOTE — Progress Notes (Deleted)
Referring Provider: Nolene Ebbs, MD Primary Care Physician:  Nolene Ebbs, MD Primary GI:  Dr. Oneida Alar  No chief complaint on file.   HPI:   Dalton [name] is a 58 y.o. male who presents for follow-up on constipation and abdominal pain.  The patient was last seen in our office 11/07/2018 for the same.  History of tubular adenoma on colonoscopy next due in 2020.  Previously tried and failed Amitiza and magnesium citrate for constipation.  Linzess not very effective either including 290 mcg dose.  We attempted to get Movantik for the patient but it required a prior Auth of [facility]istor, which ended up not being paid for either.  We followed up with insurance who recommended Amitiza at which point we indicated that it had been tried and failed.  They recommended attempt lactulose.  At his last visit he was frustrated with insurance saying they were supposed to call with recommendations for what would be paid for but they never did.  He is currently requiring multiple Linzess pills a day and intermittent magnesium citrate.  Bowel movement every 2 to 3 days after multiple doses of medication.  Abdominal cramping improves after a bowel movement.  No other GI complaints.  He is currently on pain medication.  Recommended samples of Movantik, follow-up in a week with results, follow-up in our office in 6 weeks.  The patient called our office indicating [facility] worked very well and was requesting a prescription and prior Auth.  Prescription was sent to the pharmacy.  Today he states   Past Medical History:  Diagnosis Date  . Anemia   . Anxiety   . Asthma   . Bipolar disorder ([place])   . CHF (congestive heart failure) ([place])   . Chronic back pain    [facility] in [place]  . Chronic bronchitis   . Chronic lower back pain   . Congestive heart failure (CHF) ([place])   . Coronary artery disease   . Coughing   . Depression   . DVT (deep venous thrombosis) ([place]) ~ 2005   LLE  . Frequency of  urination   . GERD (gastroesophageal reflux disease)   . Grand mal seizure [facility])    entire life, last seizure in 2011;unknown etiology-pt sts heriditary (12/24/2015)  . WUJWJXBJ(478.2)    "a few times/week" (12/24/2015)  . High cholesterol   . HNP (herniated nucleus pulposus), cervical   . Hypertension   . Laceration of right hand 11/27/2010  . Laceration of wrist 2007 BIL FOREARMS  . MI (myocardial infarction) ([place])    7, last one was in 2011 (12/24/2015)  . Neuropathy   . NSAID-induced gastric ulcer    "Ibuprofen"  . PUD (peptic ulcer disease)    in 1990s, secondary to medication  . Rheumatoid arthritis ([place])    "all over" (12/24/2015)  . Shortness of breath    with exertion  . Tonsillitis, chronic    Dr. Vicki Mallet in [place]  . Type II diabetes mellitus ([place])   . Wears glasses     Past Surgical History:  Procedure Laterality Date  . ANTERIOR CERVICAL DECOMP/DISCECTOMY FUSION N/A 11/05/2016   Procedure: Anterior Cervical Discectomy and Fusion - Cervical three-Cervical four;  Surgeon: Earnie Larsson, MD;  Location: [facility];  Service: Neurosurgery;  Laterality: N/A;  . BACK SURGERY     x3  . BIOPSY N/A 05/30/2012   Procedure: BIOPSY;  Surgeon: Danie Binder, MD;  Location: [facility];  Service: Endoscopy;  Laterality:  N/A;  . BIOPSY  03/02/2016   Procedure: BIOPSY;  Surgeon: Danie Binder, MD;  [facility];  Service: Endoscopy;;  gastric  . CARDIAC CATHETERIZATION  "several"  . CARPAL TUNNEL RELEASE Bilateral   . CATARACT EXTRACTION W/PHACO Right 06/15/2016   Procedure: CATARACT EXTRACTION PHACO AND INTRAOCULAR LENS PLACEMENT (IOC);  Surgeon: Rutherford Guys, MD;  Location: [facility];  Service: Ophthalmology;  Laterality: Right;  CDE: 4.64  . CATARACT EXTRACTION W/PHACO Left 07/13/2016   Procedure: CATARACT EXTRACTION PHACO AND INTRAOCULAR LENS PLACEMENT (IOC);  Surgeon: Rutherford Guys, MD;  Location: [facility];  Service: Ophthalmology;  Laterality: Left;  CDE: 3.15  . COLONOSCOPY   12/28/2010   SLF: (MAC)Internal hemorrhoids/four small colon polyps tubular adenomas. per SLF: colonoscopy 2022  . COLONOSCOPY WITH PROPOFOL N/A 07/05/2017   Procedure: COLONOSCOPY WITH PROPOFOL;  Surgeon: Danie Binder, MD;  [facility];  Service: Endoscopy;  Laterality: N/A;  7:30am  . CORONARY ARTERY BYPASS GRAFT  2002   3 vessels  . ESOPHAGOGASTRODUODENOSCOPY N/A 05/30/2012   SLF: UNCONTROLLED GERD DUE TO LIFESTYLE CHOICE/WEIGHT GAIN/MILD Non-erosive gastritis  . ESOPHAGOGASTRODUODENOSCOPY (EGD) WITH PROPOFOL N/A 03/02/2016   Dr. Oneida Alar: Esophagus appeared normal, impaired dilation performed, patchy inflammation with edema and erythema of the entire stomach., Biopsy with H pylori, patient completed Pylera.   [name] Kitchen FRACTURE SURGERY    . LEFT HEART CATHETERIZATION WITH CORONARY ANGIOGRAM N/A 08/31/2011   Procedure: LEFT HEART CATHETERIZATION WITH CORONARY ANGIOGRAM;  Surgeon: Laverda Page, MD;  Location: [facility] CATH LAB;  Service: Cardiovascular;  Laterality: N/A;  . LUMBAR DISC SURGERY     "L4-5; Dr. Trenton Gammon"  . MULTIPLE TOOTH EXTRACTIONS    . ORIF MANDIBULAR FRACTURE N/A 12/19/2015   Procedure: OPEN REDUCTION INTERNAL FIXATION (ORIF) MANDIBULAR FRACTURE;  Surgeon: Izora Gala, MD;  Location: [facility];  Service: ENT;  Laterality: N/A;  . PATELLA FRACTURE SURGERY Left 1976   plate to knee cap from accident  . POLYPECTOMY  07/05/2017   Procedure: POLYPECTOMY;  Surgeon: Danie Binder, MD;  [facility];  Service: Endoscopy;;  colon  . SAVORY DILATION  12/28/2010   SLF:(MAC)J-shaped stomach/nodular mocosa in the distal esophagus/empiric dilation 57m  . SAVORY DILATION N/A 03/02/2016   Procedure: SAVORY DILATION;  Surgeon: SDanie Binder MD;  [facility];  Service: Endoscopy;  Laterality: N/A;    Current Outpatient Medications  Medication Sig Dispense Refill  . albuterol (PROVENTIL HFA;VENTOLIN HFA) 108 (90 Base) MCG/ACT inhaler Inhale 2 puffs into the lungs every 6  (six) hours as needed for wheezing or shortness of breath.     . alprazolam (XANAX) 2 MG tablet Take 2 mg by mouth 2 (two) times daily.    .[name] KitchenamLODipine (NORVASC) 5 MG tablet Take 1 tablet (5 mg total) by mouth daily. 90 tablet 1  . aspirin EC 81 MG tablet Take 81 mg by mouth at bedtime.    .[name] Kitchenatorvastatin (LIPITOR) 80 MG tablet Take 1 tablet (80 mg total) by mouth daily. 90 tablet 3  . beclomethasone (QVAR) 80 MCG/ACT inhaler Inhale 2 puffs into the lungs as needed.     . cetirizine (ZYRTEC) 10 MG tablet Take 10 mg by mouth daily as needed for allergies.   0  . dexlansoprazole (DEXILANT) 60 MG capsule Take 1 capsule (60 mg total) by mouth daily. 30 capsule 5  . Evolocumab (REPATHA SURECLICK) 1127MG/ML SOAJ Inject 140 mg into the skin every 14 (fourteen) days. 2 pen 11  .  fluticasone (FLONASE) 50 MCG/ACT nasal spray Place 2 sprays into both nostrils daily as needed for allergies.     . furosemide (LASIX) 40 MG tablet Take 1 tablet (40 mg total) by mouth daily. 90 tablet 1  . glipiZIDE (GLUCOTROL) 10 MG tablet Take 10 mg by mouth daily before breakfast.    . ipratropium (ATROVENT) 0.02 % nebulizer solution Take 0.5 mg by nebulization 2 (two) times daily as needed (for congestion).    [name] Kitchen LINZESS 290 MCG CAPS capsule TAKE 1 CAPSULE(290 MCG) BY MOUTH DAILY BEFORE BREAKFAST (Patient taking differently: Takes 4-5 capsules per day) 30 capsule 5  . lisinopril (PRINIVIL,ZESTRIL) 10 MG tablet Take 10 mg by mouth daily.    . magnesium citrate SOLN Take 1 Bottle by mouth as needed for severe constipation.    . meclizine (ANTIVERT) 25 MG tablet Take 25 mg by mouth 3 (three) times daily as needed for dizziness.    . metFORMIN (GLUCOPHAGE) 500 MG tablet Take 1 tablet (500 mg total) by mouth daily with breakfast. (Patient taking differently: Take 500 mg by mouth 2 (two) times daily with a meal. ) 30 tablet 0  . metoprolol tartrate (LOPRESSOR) 25 MG tablet Take 0.5 tablets (12.5 mg total) by mouth 2 (two) times daily.  30 tablet 0  . naloxegol oxalate (MOVANTIK) 25 MG TABS tablet Take 1 tablet (25 mg total) by mouth daily. 30 tablet 3  . NARCAN 4 MG/0.1ML LIQD nasal spray kit Place 1 spray into the nose once.   0  . nitroGLYCERIN (NITROSTAT) 0.4 MG SL tablet SEE NOTES 100 tablet 3  . oxyCODONE-acetaminophen (PERCOCET) 10-325 MG tablet Take 1 tablet by mouth every 4 (four) hours as needed for pain.    . potassium chloride (K-DUR,KLOR-CON) 10 MEQ tablet Take 1 tablet (10 mEq total) by mouth daily. 30 tablet 0  . sitaGLIPtin (JANUVIA) 100 MG tablet Take 100 mg by mouth daily.    . tamsulosin (FLOMAX) 0.4 MG CAPS capsule Take 1 capsule (0.4 mg total) by mouth daily after breakfast. 30 capsule 0   No current facility-administered medications for this visit.     Allergies as of 12/21/2018 - Review Complete 12/07/2018  Allergen Reaction Noted  . Gabapentin Hives   . Ibuprofen Other (See Comments)   . Zolpidem tartrate Other (See Comments)   . Naproxen Other (See Comments)     Family History  Problem Relation Age of Onset  . Diabetes Mother   . Hypertension Mother   . Heart attack Mother 41  . Hypertension Father   . Diabetes Father   . Heart attack Father 46  . Heart attack Other        mother, father, brother, sister all deceased due to MI  . Heart attack Sister   . Heart attack Brother   . Seizures Brother   . Heart failure Other   . Colon cancer Neg Hx   . Liver disease Neg Hx   . Anesthesia problems Neg Hx   . Hypotension Neg Hx   . Malignant hyperthermia Neg Hx   . Pseudochol deficiency Neg Hx   . Colon polyps Neg Hx     Social History   Socioeconomic History  . Marital status: Single    Spouse name: Not on file  . Number of children: 2  . Years of education: Not on file  . Highest education level: Not on file  Occupational History  . Occupation: disabled    Fish farm manager: NOT EMPLOYED  Social Needs  .  Financial resource strain: Not on file  . Food insecurity    Worry: Not on file     Inability: Not on file  . Transportation needs    Medical: Not on file    Non-medical: Not on file  Tobacco Use  . Smoking status: Never Smoker  . Smokeless tobacco: Never Used  Substance and Sexual Activity  . Alcohol use: No    Alcohol/week: 0.0 standard drinks  . 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 Sexual activity: Not Currently  Lifestyle  . Physical activity    Days per week: Not on file    Minutes per session: Not on file  . Stress: Not on file  Relationships  . Social Herbalist on phone: Not on file    Gets together: Not on file    Attends religious service: Not on file    Active member of club or organization: Not on file    Attends meetings of clubs or organizations: Not on file    Relationship status: Not on file  Other Topics Concern  . Not on file  Social History Narrative   Lives w/ son-23/22    Review of Systems: General: Negative for anorexia, weight loss, fever, chills, fatigue, weakness. Eyes: Negative for vision changes.  ENT: Negative for hoarseness, difficulty swallowing , nasal congestion. CV: Negative for chest pain, angina, palpitations, dyspnea on exertion, peripheral edema.  Respiratory: Negative for dyspnea at rest, dyspnea on exertion, cough, sputum, wheezing.  GI: See history of present illness. GU:  Negative for dysuria, hematuria, urinary incontinence, urinary frequency, nocturnal urination.  MS: Negative for joint pain, low back pain.  Derm: Negative for rash or itching.  Neuro: Negative for weakness, abnormal sensation, seizure, frequent headaches, memory loss, confusion.  Psych: Negative for anxiety, depression, suicidal ideation, hallucinations.  Endo: Negative for unusual weight change.  Heme: Negative for bruising or bleeding. Allergy: Negative for rash or hives.   Physical Exam: There were no vitals taken for this visit. General:    Alert and oriented. Pleasant and cooperative. Well-nourished and well-developed.  Head:  Normocephalic and atraumatic. Eyes:  Without icterus, sclera clear and conjunctiva pink.  Ears:  Normal auditory acuity. Mouth:  No deformity or lesions, oral mucosa pink.  Throat/Neck:  Supple, without mass or thyromegaly. Cardiovascular:  S1, S2 present without murmurs appreciated. Normal pulses noted. Extremities without clubbing or edema. Respiratory:  Clear to auscultation bilaterally. No wheezes, rales, or rhonchi. No distress.  Gastrointestinal:  +BS, soft, non-tender and non-distended. No HSM noted. No guarding or rebound. No masses appreciated.  Rectal:  Deferred  Musculoskalatal:  Symmetrical without gross deformities. Normal posture. Skin:  Intact without significant lesions or rashes. Neurologic:  Alert and oriented x4;  grossly normal neurologically. Psych:  Alert and cooperative. Normal mood and affect. Heme/Lymph/Immune: No significant cervical adenopathy. No excessive bruising noted.    12/21/2018 7:36 AM   Disclaimer: This note was dictated with voice recognition software. Similar sounding words can inadvertently be transcribed and may not be corrected upon review.

## 2018-12-28 ENCOUNTER — Ambulatory Visit: Payer: Medicare Other | Admitting: Nurse Practitioner

## 2019-01-03 ENCOUNTER — Other Ambulatory Visit: Payer: Self-pay

## 2019-01-03 DIAGNOSIS — E785 Hyperlipidemia, unspecified: Secondary | ICD-10-CM

## 2019-01-04 ENCOUNTER — Other Ambulatory Visit: Payer: Self-pay | Admitting: Neurosurgery

## 2019-01-04 DIAGNOSIS — M5416 Radiculopathy, lumbar region: Secondary | ICD-10-CM

## 2019-01-11 ENCOUNTER — Other Ambulatory Visit: Payer: Self-pay

## 2019-01-11 ENCOUNTER — Encounter (INDEPENDENT_AMBULATORY_CARE_PROVIDER_SITE_OTHER): Payer: Self-pay

## 2019-01-11 ENCOUNTER — Other Ambulatory Visit: Payer: 59 | Admitting: *Deleted

## 2019-01-11 LAB — LIPID PANEL
Chol/HDL Ratio: 3.4 ratio (ref 0.0–5.0)
Cholesterol, Total: 123 mg/dL (ref 100–199)
HDL: 36 mg/dL — ABNORMAL LOW (ref >39)
LDL Chol Calc (NIH): 41 mg/dL (ref 0–99)
Triglycerides: 306 mg/dL — ABNORMAL HIGH (ref 0–149)
VLDL Cholesterol Cal: 46 mg/dL — ABNORMAL HIGH (ref 5–40)

## 2019-01-15 ENCOUNTER — Ambulatory Visit: Payer: Medicare Other | Admitting: Nurse Practitioner

## 2019-01-16 ENCOUNTER — Emergency Department (HOSPITAL_COMMUNITY)
Admission: EM | Admit: 2019-01-16 | Discharge: 2019-01-16 | Disposition: A | Discharge status: Home / Self Care | Payer: Medicare Other | Attending: Emergency Medicine | Admitting: Emergency Medicine

## 2019-01-16 ENCOUNTER — Encounter (HOSPITAL_COMMUNITY): Payer: Self-pay | Admitting: Emergency Medicine

## 2019-01-16 ENCOUNTER — Other Ambulatory Visit: Payer: Self-pay

## 2019-01-16 ENCOUNTER — Telehealth: Payer: Self-pay | Admitting: *Deleted

## 2019-01-16 DIAGNOSIS — G8929 Other chronic pain: Secondary | ICD-10-CM

## 2019-01-16 DIAGNOSIS — Z79899 Other long term (current) drug therapy: Secondary | ICD-10-CM | POA: Diagnosis not present

## 2019-01-16 DIAGNOSIS — E119 Type 2 diabetes mellitus without complications: Secondary | ICD-10-CM | POA: Insufficient documentation

## 2019-01-16 DIAGNOSIS — I509 Heart failure, unspecified: Secondary | ICD-10-CM | POA: Diagnosis not present

## 2019-01-16 DIAGNOSIS — M545 Low back pain: Secondary | ICD-10-CM | POA: Diagnosis present

## 2019-01-16 DIAGNOSIS — Y929 Unspecified place or not applicable: Secondary | ICD-10-CM | POA: Diagnosis not present

## 2019-01-16 DIAGNOSIS — Z7984 Long term (current) use of oral hypoglycemic drugs: Secondary | ICD-10-CM | POA: Insufficient documentation

## 2019-01-16 DIAGNOSIS — J45909 Unspecified asthma, uncomplicated: Secondary | ICD-10-CM | POA: Insufficient documentation

## 2019-01-16 DIAGNOSIS — I110 Hypertensive heart disease with heart failure: Secondary | ICD-10-CM | POA: Insufficient documentation

## 2019-01-16 DIAGNOSIS — M5440 Lumbago with sciatica, unspecified side: Secondary | ICD-10-CM | POA: Diagnosis not present

## 2019-01-16 DIAGNOSIS — Y999 Unspecified external cause status: Secondary | ICD-10-CM | POA: Insufficient documentation

## 2019-01-16 DIAGNOSIS — Y9389 Activity, other specified: Secondary | ICD-10-CM | POA: Insufficient documentation

## 2019-01-16 DIAGNOSIS — W57XXXA Bitten or stung by nonvenomous insect and other nonvenomous arthropods, initial encounter: Secondary | ICD-10-CM | POA: Diagnosis not present

## 2019-01-16 DIAGNOSIS — S70361A Insect bite (nonvenomous), right thigh, initial encounter: Secondary | ICD-10-CM | POA: Diagnosis not present

## 2019-01-16 DIAGNOSIS — Z7982 Long term (current) use of aspirin: Secondary | ICD-10-CM | POA: Diagnosis not present

## 2019-01-16 DIAGNOSIS — I2510 Atherosclerotic heart disease of native coronary artery without angina pectoris: Secondary | ICD-10-CM | POA: Insufficient documentation
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
## 2019-01-16 NOTE — ED Triage Notes (Addendum)
PT c/o lower back pain radiating down right leg x2 weeks. PT states thought spider bite to right upper leg around the same time but has been using a cream and seemed to make the two areas look better. PT states chronic back pain and takes Percocet 6 times a day but has been helping his son remodel a house recently as well. PT states he waited 8 hours at [facility] in the waiting room last night and he was never seen.

## 2019-01-16 NOTE — Telephone Encounter (Signed)
-----   Message from Arnoldo Lenis, MD sent at 01/15/2019  3:47 PM EST ----- Cholesterol shows his LDL (bad cholesterol) is at goal. His triglycerides, a certain type of fat in the blood, is too high. Needs to cut back on sweets, fried foods, sodas to improve this number. No medicaiton changes are needed J BrancH MD

## 2019-01-16 NOTE — Telephone Encounter (Signed)
Pt voiced understanding - routed to pcp  

## 2019-01-16 NOTE — Discharge Instructions (Addendum)
Please follow up with your back specialist at your scheduled MRI.  You may continue with warm soaks, along with neosporin to your wound.   If you experience any fever, abdominal pain or worsening symptoms please return to the ED.

## 2019-02-02 ENCOUNTER — Ambulatory Visit
Admission: RE | Admit: 2019-02-02 | Discharge: 2019-02-02 | Disposition: A | Discharge status: Home / Self Care | Payer: Medicare Other | Source: Ambulatory Visit | Attending: Neurosurgery | Admitting: Neurosurgery

## 2019-02-02 ENCOUNTER — Other Ambulatory Visit: Payer: Self-pay

## 2019-02-02 DIAGNOSIS — M5416 Radiculopathy, lumbar region: Secondary | ICD-10-CM

## 2019-02-02 MED ORDER — GADOBENATE DIMEGLUMINE 529 MG/ML IV SOLN
20.0000 mL | Freq: Once | INTRAVENOUS | Status: AC | PRN
  Administered 2019-02-02: 20 mL via INTRAVENOUS

## 2019-02-07 ENCOUNTER — Telehealth (INDEPENDENT_AMBULATORY_CARE_PROVIDER_SITE_OTHER): Payer: Medicare Other | Admitting: Family Medicine

## 2019-02-07 ENCOUNTER — Encounter: Payer: Self-pay | Admitting: Cardiology

## 2019-02-07 VITALS — BP 164/97 | HR 67 | Ht 70.0 in | Wt 260.0 lb

## 2019-02-07 DIAGNOSIS — I5032 Chronic diastolic (congestive) heart failure: Secondary | ICD-10-CM

## 2019-02-07 DIAGNOSIS — I25118 Atherosclerotic heart disease of native coronary artery with other forms of angina pectoris: Secondary | ICD-10-CM | POA: Diagnosis not present

## 2019-02-07 DIAGNOSIS — I110 Hypertensive heart disease with heart failure: Secondary | ICD-10-CM

## 2019-02-07 DIAGNOSIS — E782 Mixed hyperlipidemia: Secondary | ICD-10-CM | POA: Diagnosis not present

## 2019-02-07 DIAGNOSIS — I10 Essential (primary) hypertension: Secondary | ICD-10-CM

## 2019-02-07 DIAGNOSIS — E785 Hyperlipidemia, unspecified: Secondary | ICD-10-CM

## 2019-02-07 MED ORDER — AMLODIPINE BESYLATE 5 MG PO TABS: 5.0000 mg | ORAL_TABLET | Freq: Every day | ORAL | 1 refills | Status: DC

## 2019-02-07 NOTE — Progress Notes (Signed)
Virtual Visit via Telephone Note   This visit type was conducted due to national recommendations for restrictions regarding the COVID-19 Pandemic (e.g. social distancing) in an effort to limit this patient's exposure and mitigate transmission in our community.  Due to his co-morbid illnesses, this patient is at least at moderate risk for complications without adequate follow up.  This format is felt to be most appropriate for this patient at this time.  The patient did not have access to video technology/had technical difficulties with video requiring transitioning to audio format only (telephone).  All issues noted in this document were discussed and addressed.  No physical exam could be performed with this format.  Please refer to the patient's chart for his  consent to telehealth for [facility].   Date:  02/07/2019   ID:  Dalton [name], DOB 1960/04/09, MRN [id number]  Patient Location: Home Provider Location: Office  PCP:  Nolene Ebbs, MD  Cardiologist:  Carlyle Dolly, MD  Electrophysiologist:  None   Evaluation Performed:  Follow-Up Visit  Chief Complaint: Follow-up for coronary artery disease, chest pain, hypertension, hyperlipidemia  History of Present Illness:    Dalton [name] is a 59 y.o. male with history of coronary artery disease with CABG of LIMA to LAD.,  Hypertension, hyperlipidemia, back pain, heart failure  Had a recent low risk nuclear stress study on November 01, 2018.  See results below..  Patient has been unable to take antianginal therapy due to side effects.  He had fatigue on Ranexa and headaches on Imdur.  Patient reports she had to take sublingual nitroglycerin a couple of times last week secondary to chest pain.  Also reports some back pain.  Recently had an MRI of his lumbar spine with results below.  Patient recently presented to the emergency room [facility] on January 16, 2019 for complaints of back pain and an insect bite.  He had a CT  of the head and CT cervical spine.   Later had MRI of the lumbar spine demonstrating at L3/L4 there is a broad-based disc bulge with a right foraminal disc protrusion.  Severe right foraminal stenosis.  Mild left foraminal stenosis.  No central canal stenosis.  Mild bilateral facet arthropathy.  States he is going to follow-up with his back surgeon soon  Cardiac medication regimen reviewed today.  Medications include amlodipine 5 mg daily, aspirin 81 mg daily, atorvastatin 80 mg daily, furosemide 40 mg daily, lisinopril 10 mg.  The patient does not have symptoms concerning for COVID-19 infection (fever, chills, cough, or new shortness of breath).    Past Medical History:  Diagnosis Date  . Anemia   . Anxiety   . Asthma   . Bipolar disorder ([place])   . CHF (congestive heart failure) ([place])   . Chronic back pain    Pain Clinic in [place]  . Chronic bronchitis   . Chronic lower back pain   . Congestive heart failure (CHF) ([place])   . Coronary artery disease   . Coughing   . Depression   . DVT (deep venous thrombosis) ([place]) ~ 2005   LLE  . Frequency of urination   . GERD (gastroesophageal reflux disease)   . 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 YYTKPTWS(568.1)    "a few times/week" (12/24/2015)  . High cholesterol   . HNP (herniated nucleus pulposus), cervical   . Hypertension   . Laceration of right hand  11/27/2010  . Laceration of wrist 2007 BIL FOREARMS  . MI (myocardial infarction) ([place])    7, last one was in 2011 (12/24/2015)  . Neuropathy   . NSAID-induced gastric ulcer    "Ibuprofen"  . PUD (peptic ulcer disease)    in 1990s, secondary to medication  . Rheumatoid arthritis ([place])    "all over" (12/24/2015)  . Shortness of breath    with exertion  . Tonsillitis, chronic    Dr. Vicki Mal[place]  . Type II diabetes mellitus ([place])   . Wears glasses    Past Surgical History:  Procedure  Laterality Date  . ANTERIOR CERVICAL DECOMP/DISCECTOMY FUSION N/A 11/05/2016   Procedure: Anterior Cervical Discectomy and Fusion - Cervical three-Cervical four;  Surgeon: Earnie Larsson, MD;  Location: [place];  Service: Neurosurgery;  Laterality: N/A;  . BACK SURGERY     x3  . BIOPSY N/A 05/30/2012   Procedure: BIOPSY;  Surgeon: Danie Binder, MD;  Location: [facility];  Service: Endoscopy;  Laterality: N/A;  . BIOPSY  03/02/2016   Procedure: BIOPSY;  Surgeon: Danie Binder, MD;  [facility];  Service: Endoscopy;;  gastric  . CARDIAC CATHETERIZATION  "several"  . CARPAL TUNNEL RELEASE Bilateral   . CATARACT EXTRACTION W/PHACO Right 06/15/2016   Procedure: CATARACT EXTRACTION PHACO AND INTRAOCULAR LENS PLACEMENT (IOC);  Surgeon: Rutherford Guys, MD;  Location: [facility];  Service: Ophthalmology;  Laterality: Right;  CDE: 4.64  . CATARACT EXTRACTION W/PHACO Left 07/13/2016   Procedure: CATARACT EXTRACTION PHACO AND INTRAOCULAR LENS PLACEMENT (IOC);  Surgeon: Rutherford Guys, MD;  Location: [facility];  Service: Ophthalmology;  Laterality: Left;  CDE: 3.15  . COLONOSCOPY  12/28/2010   SLF: (MAC)Internal hemorrhoids/four small colon polyps tubular adenomas. per SLF: colonoscopy 2022  . COLONOSCOPY WITH PROPOFOL N/A 07/05/2017   Procedure: COLONOSCOPY WITH PROPOFOL;  Surgeon: Danie Binder, MD;  [facility];  Service: Endoscopy;  Laterality: N/A;  7:30am  . CORONARY ARTERY BYPASS GRAFT  2002   3 vessels  . ESOPHAGOGASTRODUODENOSCOPY N/A 05/30/2012   SLF: UNCONTROLLED GERD DUE TO LIFESTYLE CHOICE/WEIGHT GAIN/MILD Non-erosive gastritis  . ESOPHAGOGASTRODUODENOSCOPY (EGD) WITH PROPOFOL N/A 03/02/2016   Dr. Oneida Alar: Esophagus appeared normal, impaired dilation performed, patchy inflammation with edema and erythema of the entire stomach., Biopsy with H pylori, patient completed Pylera.   [name] Kitchen FRACTURE SURGERY    . LEFT HEART CATHETERIZATION WITH CORONARY ANGIOGRAM N/A 08/31/2011   Procedure: LEFT HEART  CATHETERIZATION WITH CORONARY ANGIOGRAM;  Surgeon: Laverda Page, MD;  Location: [facility] CATH LAB;  Service: Cardiovascular;  Laterality: N/A;  . LUMBAR DISC SURGERY     "L4-5; Dr. Trenton Gammon"  . MULTIPLE TOOTH EXTRACTIONS    . ORIF MANDIBULAR FRACTURE N/A 12/19/2015   Procedure: OPEN REDUCTION INTERNAL FIXATION (ORIF) MANDIBULAR FRACTURE;  Surgeon: Izora Gala, MD;  Location: [place];  Service: ENT;  Laterality: N/A;  . PATELLA FRACTURE SURGERY Left 1976   plate to knee cap from accident  . POLYPECTOMY  07/05/2017   Procedure: POLYPECTOMY;  Surgeon: Danie Binder, MD;  [facility];  Service: Endoscopy;;  colon  . SAVORY DILATION  12/28/2010   SLF:(MAC)J-shaped stomach/nodular mocosa in the distal esophagus/empiric dilation 37m  . SAVORY DILATION N/A 03/02/2016   Procedure: SAVORY DILATION;  Surgeon: SDanie Binder MD;  [facility];  Service: Endoscopy;  Laterality: N/A;     Current Meds  Medication Sig  . albuterol (PROVENTIL HFA;VENTOLIN HFA) 108 (90 Base)  MCG/ACT inhaler Inhale 2 puffs into the lungs every 6 (six) hours as needed for wheezing or shortness of breath.   . alprazolam (XANAX) 2 MG tablet Take 2 mg by mouth 2 (two) times daily.  [name] Kitchen amLODipine (NORVASC) 2.5 MG tablet Take 2.5 mg by mouth daily.  [name] Kitchen atorvastatin (LIPITOR) 80 MG tablet Take 1 tablet (80 mg total) by mouth daily.  . beclomethasone (QVAR) 80 MCG/ACT inhaler Inhale 2 puffs into the lungs as needed.   . cetirizine (ZYRTEC) 10 MG tablet Take 10 mg by mouth daily as needed for allergies.   [name] Kitchen dexlansoprazole (DEXILANT) 60 MG capsule Take 1 capsule (60 mg total) by mouth daily.  . Evolocumab (REPATHA SURECLICK) 701 MG/ML SOAJ Inject 140 mg into the skin every 14 (fourteen) days.  . fluticasone (FLONASE) 50 MCG/ACT nasal spray Place 2 sprays into both nostrils daily as needed for allergies.   . furosemide (LASIX) 40 MG tablet Take 1 tablet (40 mg total) by mouth daily.  [name] Kitchen glipiZIDE (GLUCOTROL) 10 MG  tablet Take 10 mg by mouth daily before breakfast.  . ipratropium (ATROVENT) 0.02 % nebulizer solution Take 0.5 mg by nebulization 2 (two) times daily as needed (for congestion).  [name] Kitchen LINZESS 290 MCG CAPS capsule TAKE 1 CAPSULE(290 MCG) BY MOUTH DAILY BEFORE BREAKFAST (Patient taking differently: Takes 4-5 capsules per day)  . lisinopril (PRINIVIL,ZESTRIL) 10 MG tablet Take 10 mg by mouth daily.  . magnesium citrate SOLN Take 1 Bottle by mouth as needed for severe constipation.  . meclizine (ANTIVERT) 25 MG tablet Take 25 mg by mouth 3 (three) times daily as needed for dizziness.  . metFORMIN (GLUCOPHAGE) 500 MG tablet Take 1 tablet (500 mg total) by mouth daily with breakfast. (Patient taking differently: Take 500 mg by mouth 2 (two) times daily with a meal. )  . metoprolol tartrate (LOPRESSOR) 25 MG tablet Take 0.5 tablets (12.5 mg total) by mouth 2 (two) times daily.  . naloxegol oxalate (MOVANTIK) 25 MG TABS tablet Take 1 tablet (25 mg total) by mouth daily.  [name] Kitchen NARCAN 4 MG/0.1ML LIQD nasal spray kit Place 1 spray into the nose once.   . nitroGLYCERIN (NITROSTAT) 0.4 MG SL tablet SEE NOTES  . oxyCODONE-acetaminophen (PERCOCET) 10-325 MG tablet Take 1 tablet by mouth every 4 (four) hours as needed for pain.  . potassium chloride (K-DUR,KLOR-CON) 10 MEQ tablet Take 1 tablet (10 mEq total) by mouth daily.  . sitaGLIPtin (JANUVIA) 100 MG tablet Take 100 mg by mouth daily.  . tamsulosin (FLOMAX) 0.4 MG CAPS capsule Take 1 capsule (0.4 mg total) by mouth daily after breakfast.  . [DISCONTINUED] amLODipine (NORVASC) 5 MG tablet Take 1 tablet (5 mg total) by mouth daily.  . [DISCONTINUED] aspirin EC 81 MG tablet Take 81 mg by mouth at bedtime.     Allergies:   Gabapentin, Ibuprofen, Zolpidem tartrate, and Naproxen   Social History   Tobacco Use  . Smoking status: Never Smoker  . Smokeless tobacco: Never Used  Substance Use Topics  . Alcohol use: No    Alcohol/week: 0.0 standard drinks  . 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 There is no history of Colon cancer, Liver disease, Anesthesia problems, Hypotension, Malignant hyperthermia, Pseudochol deficiency, or Colon polyps.  ROS:   Please see the history of present illness Complaining of back pain and some episodes of resting chest pain during the last 2 weeks.  Patient states he took sublingual nitroglycerin to leave pain..    All other systems reviewed and are negative.   Prior CV studies:   The following studies were reviewed today:  06/2017 echo Study Conclusions  - Left ventricle: The cavity size was normal. Wall thickness was increased in a pattern of mild LVH. Systolic function was normal. The estimated ejection fraction was in the range of 60% to 65%.  Nuclear stress test November 01, 2018  There was no ST segment deviation noted during stress.  Findings consistent with small prior mid to distal anterior myocardial infarction with mild peri-infarct ischemia.  This is a low risk study. The left ventricular ejection fraction is normal (55-65%).  LHC (08/31/11, Dr. Einar Gip): LMCA normal. LAD normal. LAD normal. RCA tortuous but otherwise normal. LVEF 55-60%. LVEDP 19 mmHg. Patent left subclavian artery stent with atretic LIMA.  LHC (10/24/08): LMCA normal. LAD with midvessel bridge and 40% stenosis in systole. Ostial LAD with 30% stenosis. LCx with mild luminal irregularities. RCA with mild luminal irregularities. Patent left subclavian artery stent. LIMA -> LAD proximally occluded.  Labs/Other  Tests and Data Reviewed:    EKG:  An ECG dated September 16, 2017 was personally reviewed today and demonstrated:  Sinus rhythm with premature supraventricular complexes, heart rate of 65  Recent Labs: No results found for requested labs within last 8760 hours.   Recent Lipid Panel Lab Results  Component Value Date/Time   CHOL 123 01/11/2019 11:20 AM   TRIG 306 (H) 01/11/2019 11:20 AM   HDL 36 (L) 01/11/2019 11:20 AM   CHOLHDL 3.4 01/11/2019 11:20 AM   CHOLHDL 2.0 12/26/2017 11:12 AM   LDLCALC 41 01/11/2019 11:20 AM   LDLCALC 10 12/26/2017 11:12 AM    Wt Readings from Last 3 Encounters:  02/07/19 260 lb (117.9 kg)  01/16/19 265 lb (120.2 kg)  12/07/18 263 lb (119.3 kg)     Objective:    Vital Signs:  BP (!) 164/97   Pulse 67   Ht _0  (1.778 m)   Wt 260 lb (117.9 kg)   BMI 37.31 kg/m    VITAL SIGNS:  reviewed   Patient speaking in complete sentences with normal speech pattern and responding appropriately to questions.  No evidence of cough, wheezing, or dyspnea.  ASSESSMENT & PLAN:    1. Coronary artery disease of native artery of native heart with stable angina pectoris [facility])  He had a recent low risk nuclear stress study on November 01, 2018.  Patient states he has used his sublingual nitroglycerin a few times in the last 2 weeks.  We will increase amlodipine to 5 mg daily for both blood pressure management and treatment of angina.  Continue atorvastatin 80 mg, continue sublingual nitroglycerin as needed.   2. Essential hypertension Patient states his blood pressure has been up recently due to likely back pain and stress.  States his last recorded blood pressure by the nurse who comes to his house was 164/97 and his heart rate was 67.  Increase amlodipine to 5 mg daily.  Continue lisinopril 10 mg daily  3. Mixed hyperlipidemia Recent lipid levels  January 11, 2019 showed total cholesterol 123, triglycerides 306, HDL 36, LDL 41. Continue atorvastatin 80 mg daily  4.  Chronic heart failure with preserved ejection fraction ([place]) Last echo in May 2019 showed mild LVH with an ejection fraction of 60 to 65%.  Patient denies any significant dyspnea or lower extremity edema. Continue Lasix 40 mg daily.   COVID-19 Education: The signs and symptoms of COVID-19 were discussed with the patient and how to seek care for testing (follow up with PCP or arrange E-visit). The importance of social distancing was discussed today.  Time:   Today, I have spent 15 minutes with the patient with telehealth technology discussing the above problems.     Medication Adjustments/Labs and Tests Ordered: Current medicines are reviewed at length with the patient today.  Concerns regarding medicines are outlined above.   Tests Ordered: No orders of the defined types were placed in this encounter.   Medication Changes: No orders of the defined types were placed in this encounter.   Follow Up:  Either In Person or Virtual in 6 month(s)  Signed, Verta Ellen, NP  02/07/2019 9:56 AM    [place]

## 2019-02-07 NOTE — Patient Instructions (Signed)
Your physician wants you to follow-up in: 6 MONTHS WITH DR BRANCH You will receive a reminder letter in the mail two months in advance. If you don't receive a letter, please call our office to schedule the follow-up appointment.  Your physician has recommended you make the following change in your medication:   INCREASE AMLODIPINE 5 MG DAILY   Thank you for choosing  [facility]!!     

## 2019-02-08 ENCOUNTER — Other Ambulatory Visit: Payer: Self-pay

## 2019-02-08 ENCOUNTER — Encounter: Payer: Self-pay | Admitting: Nurse Practitioner

## 2019-02-08 ENCOUNTER — Ambulatory Visit (INDEPENDENT_AMBULATORY_CARE_PROVIDER_SITE_OTHER): Payer: Medicare Other | Admitting: Nurse Practitioner

## 2019-02-08 VITALS — BP 148/94 | HR 80 | Temp 97.0°F | Ht 70.0 in | Wt 277.6 lb

## 2019-02-08 DIAGNOSIS — K5900 Constipation, unspecified: Secondary | ICD-10-CM

## 2019-02-08 DIAGNOSIS — R1030 Lower abdominal pain, unspecified: Secondary | ICD-10-CM | POA: Diagnosis not present

## 2019-02-08 NOTE — Patient Instructions (Signed)
Your health issues we discussed today were:   Constipation with abdominal pain: 1. Continue your current regimen of: Movantik every day, Linzess as needed, mag citrate as needed 2. Call us if you have any worsening or severe symptoms.  Overall I recommend:  1. Continue your other current medications 2. Return for follow-up in 6 months 3. Call us if you have any questions or concerns.   Because of recent events of COVID-19 ("Coronavirus"), follow CDC recommendations:  Wash your hand frequently Avoid touching your face Stay away from people who are sick If you have symptoms such as fever, cough, shortness of breath then call your healthcare provider for further guidance If you are sick, STAY AT HOME unless otherwise directed by your healthcare provider. Follow directions from state and national officials regarding staying safe   At [facility] we value your feedback. You may receive a survey about your visit today. Please share your experience as we strive to create trusting relationships with our patients to provide genuine, compassionate, quality care.  We appreciate your understanding and patience as we review any laboratory studies, imaging, and other diagnostic tests that are ordered as we care for you. Our office policy is 5 business days for review of these results, and any emergent or urgent results are addressed in a timely manner for your best interest. If you do not hear from our office in 1 week, please contact us.   We also encourage the use of MyChart, which contains your medical information for your review as well. If you are not enrolled in this feature, an access code is on this after visit summary for your convenience. Thank you for allowing [place] to be involved in your care.  It was great to see you today!  I hope you have a Merry Christmas and Happy New Year!!

## 2019-02-08 NOTE — Progress Notes (Signed)
Referring Provider: Nolene Ebbs, MD Primary Care Physician:  Nolene Ebbs, MD Primary GI:  Dr. Oneida Alar  Chief Complaint  Patient presents with  . Constipation    HPI:   Dalton [name] is a 58 y.o. male who presents for follow-up on constipation and abdominal pain.  The patient was last seen in our office 11/07/2018 for the same.  History of tubular adenoma on colonoscopy and next due in 2022.  Previously tried and failed Amitiza and magnesium citrate.  Linzess not very effective despite 290 mcg dose.  Previously attempted [employer] but insurance required trial of Relistor.  Prescribe Relistor but this would not be paid for by insurance.  Previously noted abdominal pain that improves after bowel movement.  Recommended continue Linzess 290 mcg, add Colace over-the-counter.  Follow-up with insurance any recommended Amitiza, which we informed them he tried and failed.  They recommended lactulose.  At his last visit the patient noted he called insurance and they were supposed to send [place] recommendations and somebody would call him back but they never did.  Currently requiring multiple Linzess pills a day and intermittent magnesium citrate with a bowel movement every 2 to 3 days after taking multiple medications.  Abdominal cramping improves after a bowel movement.  No other overt GI complaints.  Recommended samples of Movantik, call in 4 to 7 days with progress report, follow-up in 6 weeks.  The patient called our office a few weeks later noting Movantik was working great and requested a prescription and prior auth.  Continue recommendation.  Patient we presume that the medication was covered.  Today he states he's doing ok overall. He is still having constipation. Movantik worked well initially, but still needing Movantik daily. Eventually needed to add Linzess and MgCitrate occasionally. Having a bowel movement daily on this regimen. Stools are soft/loose. Abdominal cramping pain lower  abdomen, which improves after a bowel movement. When he is constipated has more GERD symptoms and nausea. Has had 2 episodes of scant hematochezia after straining. Denies vomiting, melena, fever, chills, unintentional weight loss. Denies URI or flu-like symptoms. Denies loss of sense of taste or smell. Denies chest pain, dyspnea, dizziness, lightheadedness, syncope, near syncope. Denies any other upper or lower GI symptoms.  Past Medical History:  Diagnosis Date  . Anemia   . Anxiety   . Asthma   . Bipolar disorder ([place])   . CHF (congestive heart failure) ([place])   . Chronic back pain    Pain Clinic in [place]  . Chronic bronchitis   . Chronic lower back pain   . Congestive heart failure (CHF) ([place])   . Coronary artery disease   . Coughing   . Depression   . DVT (deep venous thrombosis) ([place]) ~ 2005   LLE  . Frequency of urination   . GERD (gastroesophageal reflux disease)   . 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 DSKAJGOT(157.2)    "a few times/week" (12/24/2015)  . High cholesterol   . HNP (herniated nucleus pulposus), cervical   . Hypertension   . Laceration of right hand 11/27/2010  . Laceration of wrist 2007 BIL FOREARMS  . MI (myocardial infarction) ([place])    7, last one was in 2011 (12/24/2015)  . Neuropathy   . NSAID-induced gastric ulcer    "Ibuprofen"  . PUD (peptic ulcer disease)    in 1990s, secondary to medication  . Rheumatoid arthritis ([place])    "  all over" (12/24/2015)  . Shortness of breath    with exertion  . Tonsillitis, chronic    Dr. Vicki Mallet in [place]  . Type II diabetes mellitus ([place])   . Wears glasses     Past Surgical History:  Procedure Laterality Date  . ANTERIOR CERVICAL DECOMP/DISCECTOMY FUSION N/A 11/05/2016   Procedure: Anterior Cervical Discectomy and Fusion - Cervical three-Cervical four;  Surgeon: Earnie Larsson, MD;  Location: [place];  Service: Neurosurgery;   Laterality: N/A;  . BACK SURGERY     x3  . BIOPSY N/A 05/30/2012   Procedure: BIOPSY;  Surgeon: Danie Binder, MD;  [facility];  Service: Endoscopy;  Laterality: N/A;  . BIOPSY  03/02/2016   Procedure: BIOPSY;  Surgeon: Danie Binder, MD;  [facility];  Service: Endoscopy;;  gastric  . CARDIAC CATHETERIZATION  "several"  . CARPAL TUNNEL RELEASE Bilateral   . CATARACT EXTRACTION W/PHACO Right 06/15/2016   Procedure: CATARACT EXTRACTION PHACO AND INTRAOCULAR LENS PLACEMENT (IOC);  Surgeon: Rutherford Guys, MD;  Location: [facility];  Service: Ophthalmology;  Laterality: Right;  CDE: 4.64  . CATARACT EXTRACTION W/PHACO Left 07/13/2016   Procedure: CATARACT EXTRACTION PHACO AND INTRAOCULAR LENS PLACEMENT (IOC);  Surgeon: Rutherford Guys, MD;  [facility];  Service: Ophthalmology;  Laterality: Left;  CDE: 3.15  . COLONOSCOPY  12/28/2010   SLF: (MAC)Internal hemorrhoids/four small colon polyps tubular adenomas. per SLF: colonoscopy 2022  . COLONOSCOPY WITH PROPOFOL N/A 07/05/2017   Procedure: COLONOSCOPY WITH PROPOFOL;  Surgeon: Danie Binder, MD;  [facility];  Service: Endoscopy;  Laterality: N/A;  7:30am  . CORONARY ARTERY BYPASS GRAFT  2002   3 vessels  . ESOPHAGOGASTRODUODENOSCOPY N/A 05/30/2012   SLF: UNCONTROLLED GERD DUE TO LIFESTYLE CHOICE/WEIGHT GAIN/MILD Non-erosive gastritis  . ESOPHAGOGASTRODUODENOSCOPY (EGD) WITH PROPOFOL N/A 03/02/2016   Dr. Oneida Alar: Esophagus appeared normal, impaired dilation performed, patchy inflammation with edema and erythema of the entire stomach., Biopsy with H pylori, patient completed Pylera.   [name] Kitchen FRACTURE SURGERY    . LEFT HEART CATHETERIZATION WITH CORONARY ANGIOGRAM N/A 08/31/2011   Procedure: LEFT HEART CATHETERIZATION WITH CORONARY ANGIOGRAM;  Surgeon: Laverda Page, MD;  [facility] CATH LAB;  Service: Cardiovascular;  Laterality: N/A;  . LUMBAR DISC SURGERY     "L4-5; Dr. Trenton Gammon"  . MULTIPLE TOOTH EXTRACTIONS    . ORIF  MANDIBULAR FRACTURE N/A 12/19/2015   Procedure: OPEN REDUCTION INTERNAL FIXATION (ORIF) MANDIBULAR FRACTURE;  Surgeon: Izora Gala, MD;  Location: [place];  Service: ENT;  Laterality: N/A;  . PATELLA FRACTURE SURGERY Left 1976   plate to knee cap from accident  . POLYPECTOMY  07/05/2017   Procedure: POLYPECTOMY;  Surgeon: Danie Binder, MD;  [facility];  Service: Endoscopy;;  colon  . SAVORY DILATION  12/28/2010   SLF:(MAC)J-shaped stomach/nodular mocosa in the distal esophagus/empiric dilation 51m  . SAVORY DILATION N/A 03/02/2016   Procedure: SAVORY DILATION;  Surgeon: SDanie Binder MD;  [facility];  Service: Endoscopy;  Laterality: N/A;    Current Outpatient Medications  Medication Sig Dispense Refill  . albuterol (PROVENTIL HFA;VENTOLIN HFA) 108 (90 Base) MCG/ACT inhaler Inhale 2 puffs into the lungs every 6 (six) hours as needed for wheezing or shortness of breath.     . alprazolam (XANAX) 2 MG tablet Take 2 mg by mouth 2 (two) times daily.    .[name] KitchenamLODipine (NORVASC) 5 MG tablet Take 1 tablet (5 mg total) by mouth daily.  90 tablet 1  . atorvastatin (LIPITOR) 80 MG tablet Take 1 tablet (80 mg total) by mouth daily. 90 tablet 3  . beclomethasone (QVAR) 80 MCG/ACT inhaler Inhale 2 puffs into the lungs as needed.     . cetirizine (ZYRTEC) 10 MG tablet Take 10 mg by mouth daily as needed for allergies.   0  . dexlansoprazole (DEXILANT) 60 MG capsule Take 1 capsule (60 mg total) by mouth daily. 30 capsule 5  . Evolocumab (REPATHA SURECLICK) 638 MG/ML SOAJ Inject 140 mg into the skin every 14 (fourteen) days. 2 pen 11  . fluticasone (FLONASE) 50 MCG/ACT nasal spray Place 2 sprays into both nostrils daily as needed for allergies.     . furosemide (LASIX) 40 MG tablet Take 1 tablet (40 mg total) by mouth daily. 90 tablet 1  . glipiZIDE (GLUCOTROL) 10 MG tablet Take 10 mg by mouth daily before breakfast.    . ipratropium (ATROVENT) 0.02 % nebulizer solution Take 0.5 mg by  nebulization 2 (two) times daily as needed (for congestion).    [name] Kitchen LINZESS 290 MCG CAPS capsule TAKE 1 CAPSULE(290 MCG) BY MOUTH DAILY BEFORE BREAKFAST (Patient taking differently: Takes 4-5 capsules per day) 30 capsule 5  . lisinopril (PRINIVIL,ZESTRIL) 10 MG tablet Take 10 mg by mouth daily.    . magnesium citrate SOLN Take 1 Bottle by mouth as needed for severe constipation.    . meclizine (ANTIVERT) 25 MG tablet Take 25 mg by mouth 3 (three) times daily as needed for dizziness.    . metFORMIN (GLUCOPHAGE) 500 MG tablet Take 1 tablet (500 mg total) by mouth daily with breakfast. (Patient taking differently: Take 500 mg by mouth 2 (two) times daily with a meal. ) 30 tablet 0  . metoprolol tartrate (LOPRESSOR) 25 MG tablet Take 0.5 tablets (12.5 mg total) by mouth 2 (two) times daily. 30 tablet 0  . naloxegol oxalate (MOVANTIK) 25 MG TABS tablet Take 1 tablet (25 mg total) by mouth daily. 30 tablet 3  . NARCAN 4 MG/0.1ML LIQD nasal spray kit Place 1 spray into the nose once.   0  . nitroGLYCERIN (NITROSTAT) 0.4 MG SL tablet SEE NOTES 100 tablet 3  . oxyCODONE-acetaminophen (PERCOCET) 10-325 MG tablet Take 1 tablet by mouth every 4 (four) hours as needed for pain.    . potassium chloride (K-DUR,KLOR-CON) 10 MEQ tablet Take 1 tablet (10 mEq total) by mouth daily. 30 tablet 0  . sitaGLIPtin (JANUVIA) 100 MG tablet Take 100 mg by mouth daily.    . tamsulosin (FLOMAX) 0.4 MG CAPS capsule Take 1 capsule (0.4 mg total) by mouth daily after breakfast. 30 capsule 0   No current facility-administered medications for this visit.    Allergies as of 02/08/2019 - Review Complete 02/07/2019  Allergen Reaction Noted  . Gabapentin Hives   . Ibuprofen Other (See Comments)   . Zolpidem tartrate Other (See Comments)   . Naproxen Other (See Comments)     Family History  Problem Relation Age of Onset  . Diabetes Mother   . Hypertension Mother   . Heart attack Mother 19  . Hypertension Father   . Diabetes  Father   . Heart attack Father 78  . Heart attack Other        mother, father, brother, sister all deceased due to MI  . Heart attack Sister   . Heart attack Brother   . Seizures Brother   . Heart failure Other   . Colon cancer Neg Hx   .  Liver disease Neg Hx   . Anesthesia problems Neg Hx   . Hypotension Neg Hx   . Malignant hyperthermia Neg Hx   . Pseudochol deficiency Neg Hx   . Colon polyps Neg Hx     Social History   Socioeconomic History  . Marital status: Single    Spouse name: Not on file  . Number of children: 2  . Years of education: Not on file  . Highest education level: Not on file  Occupational History  . Occupation: disabled    Fish farm manager: NOT EMPLOYED  Tobacco Use  . Smoking status: Never Smoker  . Smokeless tobacco: Never Used  Substance and Sexual Activity  . Alcohol use: No    Alcohol/week: 0.0 standard drinks  . 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 Sexual activity: Not Currently  Other Topics Concern  . Not on file  Social History Narrative   Lives w/ son-23/22   Social Determinants of Health   Financial Resource Strain:   . Difficulty of Paying Living Expenses: Not on file  Food Insecurity:   . Worried About Charity fundraiser in the Last Year: Not on file  . Ran Out of Food in the Last Year: Not on file  Transportation Needs:   . Lack of Transportation (Medical): Not on file  . Lack of Transportation (Non-Medical): Not on file  Physical Activity:   . Days of Exercise per Week: Not on file  . Minutes of Exercise per Session: Not on file  Stress:   . Feeling of Stress : Not on file  Social Connections:   . Frequency of Communication with Friends and Family: Not on file  . Frequency of Social Gatherings with Friends and Family: Not on file  . Attends Religious Services: Not on file  . Active Member of Clubs or Organizations: Not on file  . Attends Theatre manager Meetings: Not on file  . Marital Status: Not on file    Review of Systems: General: Negative for anorexia, weight loss, fever, chills, fatigue, weakness. ENT: Negative for hoarseness, difficulty swallowing. CV: Negative for chest pain, angina, palpitations, peripheral edema.  Respiratory: Negative for dyspnea at rest, cough, sputum, wheezing.  GI: See history of present illness. Endo: Negative for unusual weight change.  Heme: Negative for bruising or bleeding. Allergy: Negative for rash or hives.   Physical Exam: There were no vitals taken for this visit. General:   Alert and oriented. Pleasant and cooperative. Well-nourished and well-developed.  Eyes:  Without icterus, sclera clear and conjunctiva pink.  Ears:  Normal auditory acuity. Cardiovascular:  S1, S2 present without murmurs appreciated. Extremities without clubbing or edema. Respiratory:  Clear to auscultation bilaterally. No wheezes, rales, or rhonchi. No distress.  Gastrointestinal:  +BS, soft, non-tender and non-distended. No HSM noted. No guarding or rebound. No masses appreciated.  Rectal:  Deferred  Musculoskalatal:  Symmetrical without gross deformities. Neurologic:  Alert and oriented x4;  grossly normal neurologically. Psych:  Alert and cooperative. Normal mood and affect. Heme/Lymph/Immune: No excessive bruising noted.    02/08/2019 8:36 AM   Disclaimer: This note was dictated with voice recognition software. Similar sounding words can inadvertently be transcribed and may not be corrected upon review.

## 2019-02-08 NOTE — Assessment & Plan Note (Signed)
The patient is quite difficult to control constipation.  Currently on Movantik 25 mg daily which initially worked well for him.  Since then he has had as needed Linzess and as needed mag citrate for days where Movantik does not work well.  This regimen works pretty well for him.  He does have soft/loose stools when he does have a bowel movement but he is okay with this.  Abdominal pain resolved with a bowel movement.  At this time recommend continue current regimen as trying to make further changes could complicate and worsen his symptoms.  Follow-up in 6 months and call us for any worsening or severe symptoms before then.

## 2019-02-08 NOTE — Assessment & Plan Note (Signed)
The patient does have persistent abdominal pain when he is constipated as well as bloating.  When he has bowel movement his pain does resolve.  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  This is compounded by opioid-induced constipation.  Further recommendations for difficult to manage constipation as per above.  Call us for any worsening or severe symptoms.  Follow-up in 6 months.

## 2019-02-28 ENCOUNTER — Other Ambulatory Visit: Payer: Self-pay | Admitting: Neurosurgery

## 2019-03-02 ENCOUNTER — Other Ambulatory Visit: Payer: Self-pay | Admitting: Neurosurgery

## 2019-03-02 ENCOUNTER — Other Ambulatory Visit (HOSPITAL_COMMUNITY)
Admission: RE | Admit: 2019-03-02 | Discharge: 2019-03-02 | Disposition: A | Discharge status: Home / Self Care | Payer: Medicare Other | Source: Ambulatory Visit | Attending: Neurosurgery | Admitting: Neurosurgery

## 2019-03-02 DIAGNOSIS — Z01812 Encounter for preprocedural laboratory examination: Secondary | ICD-10-CM | POA: Diagnosis present

## 2019-03-02 DIAGNOSIS — Z20822 Contact with and (suspected) exposure to covid-19: Secondary | ICD-10-CM | POA: Diagnosis not present

## 2019-03-02 NOTE — Progress Notes (Signed)
Anesthesia Chart Review: Same day workup   Case: [id number] Date/Time: 03/06/19 0945   Procedure: MICRODISCECTOMY EXTRAFORAMINAL LUMBAR 3 - LUMBAR 4 RIGHT (Right Back)   Anesthesia type: General   Pre-op diagnosis: RADICULOPOTHY LUMBAR REGION   Location: [facility] OR ROOM 20 / [place] OR   Surgeons: Earnie Larsson, MD      DISCUSSION: Follows with cardiologist Dr. Harl Bowie for hx of CAD with stable angina. He is s/p CABG LIMA-LAD in 2002 - last cath2013showed atretic LIMA, normal coronaries. He was last seen by Dr. Harl Bowie 12/07/18 to review nuclear stress test that had been ordered to evaluate recent chest pain. Per note, 10/2018 nuclear stress showed small mid to distal anterior infarction with mild peri-infarct ischemia, low risk. He was started on norvasc 2.60m daily as additional antianginal due to prior side effects on imdur and ranexa. Pt was advised to f/u in 6 weeks for recheck and was seen by ALevell July NP on 02/07/19. At that visit patient stated he had used his sublingual nitroglycerin a few times in the last 2 weeks.  His amlodipine was increased 5 mg daily for both blood pressure management and treatment of angina. He was advised to f/u in 6 months.  Discussed case with Dr. JLinna Caprice He advised we would need input from Dr. BHarl Bowieprior to proceeding.  Dr. BHarl Bowiereviewed chart and advised per telephone encounter 03/05/19, "Typically plavix would be held 5 days prior to most surgeries. If they have already accounted for this or in  general perform the surgery on plavix then ok to proceed with surgery otherwise from a cardiac standpoint."  Will need DOS labs, eval, and EKG.   VS: There were no vitals taken for this visit.  PROVIDERS: ANolene Ebbs MD is PCP  BCarlyle Dolly MD is Cardiologist  LABS: Will need DOS labs.  (all labs ordered are listed, but only abnormal results are displayed)  Labs Reviewed - No data to display   EKG: 09/12/17: Sinus rhythm with PACs. Rate  65.  CV: Nuclear stress 11/01/18:  There was no ST segment deviation noted during stress.  Findings consistent with small prior mid to distal anterior myocardial infarction with mild peri-infarct ischemia.  This is a low risk study.  The left ventricular ejection fraction is normal (55-65%).  TTE 06/30/17: Study Conclusions  - Left ventricle: The cavity size was normal. Wall thickness was   increased in a pattern of mild LVH. Systolic function was normal.   The estimated ejection fraction was in the range of 60% to 65%.  Past Medical History:  Diagnosis Date  . Anemia   . Anxiety   . Asthma   . Bipolar disorder ([place]   . CHF (congestive heart failure) ([place]   . [place] . Chronic [place]itis   . Chronic lower back pain   . Congestive heart failure (CHF) ([place]   . Coronary artery disease   . Coughing   . Depression   . DVT (deep venous [place]) ([place] ~ 2005   LLE  . Frequency of urination   . GERD (gastroesophageal reflux disease)   . 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[facility]    entire life, last seizure in 2011;unknown etiology-pt sts heriditary (12/24/2015)  . HBWLSLHTD(428.7    "a few times/week" (12/24/2015)  . High cholesterol   . HNP (herniated nucleus pulposus), cervical   . Hypertension   . Laceration of right hand 11/27/2010  . Laceration of wrist 2007 BIL FOREARMS  .  MI (myocardial infarction) ([place])    7, last one was in 2011 (12/24/2015)  . Neuropathy   . NSAID-induced gastric ulcer    "Ibuprofen"  . PUD (peptic ulcer disease)    in 1990s, secondary to medication  . Rheumatoid arthritis ([place])    "all over" (12/24/2015)  . Shortness of breath    with exertion  . Tonsillitis, chronic    Dr. Vicki Mallet in [place]  . Type II diabetes mellitus ([place])   . Wears glasses     Past Surgical History:  Procedure Laterality Date  . ANTERIOR CERVICAL DECOMP/DISCECTOMY FUSION N/A 11/05/2016   Procedure: Anterior Cervical Discectomy and  Fusion - Cervical three-Cervical four;  Surgeon: Earnie Larsson, MD;  Location: [place];  Service: Neurosurgery;  Laterality: N/A;  . BACK SURGERY     x3  . BIOPSY N/A 05/30/2012   Procedure: BIOPSY;  Surgeon: Danie Binder, MD;  [facility];  Service: Endoscopy;  Laterality: N/A;  . BIOPSY  03/02/2016   Procedure: BIOPSY;  Surgeon: Danie Binder, MD;  [facility];  Service: Endoscopy;;  gastric  . CARDIAC CATHETERIZATION  "several"  . CARPAL TUNNEL RELEASE Bilateral   . CATARACT EXTRACTION W/PHACO Right 06/15/2016   Procedure: CATARACT EXTRACTION PHACO AND INTRAOCULAR LENS PLACEMENT (IOC);  Surgeon: Rutherford Guys, MD;  [facility];  Service: Ophthalmology;  Laterality: Right;  CDE: 4.64  . CATARACT EXTRACTION W/PHACO Left 07/13/2016   Procedure: CATARACT EXTRACTION PHACO AND INTRAOCULAR LENS PLACEMENT (IOC);  Surgeon: Rutherford Guys, MD;  [facility];  Service: Ophthalmology;  Laterality: Left;  CDE: 3.15  . COLONOSCOPY  12/28/2010   SLF: (MAC)Internal hemorrhoids/four small colon polyps tubular adenomas. per SLF: colonoscopy 2022  . COLONOSCOPY WITH PROPOFOL N/A 07/05/2017   Procedure: COLONOSCOPY WITH PROPOFOL;  Surgeon: Danie Binder, MD;  [facility];  Service: Endoscopy;  Laterality: N/A;  7:30am  . CORONARY ARTERY BYPASS GRAFT  2002   3 vessels  . ESOPHAGOGASTRODUODENOSCOPY N/A 05/30/2012   SLF: UNCONTROLLED GERD DUE TO LIFESTYLE CHOICE/WEIGHT GAIN/MILD Non-erosive gastritis  . ESOPHAGOGASTRODUODENOSCOPY (EGD) WITH PROPOFOL N/A 03/02/2016   Dr. Oneida Alar: Esophagus appeared normal, impaired dilation performed, patchy inflammation with edema and erythema of the entire stomach., Biopsy with H pylori, patient completed Pylera.   [name] Kitchen FRACTURE SURGERY    . LEFT HEART CATHETERIZATION WITH CORONARY ANGIOGRAM N/A 08/31/2011   Procedure: LEFT HEART CATHETERIZATION WITH CORONARY ANGIOGRAM;  Surgeon: Laverda Page, MD;  Location[facility] CATH LAB;  Service: Cardiovascular;   Laterality: N/A;  . LUMBAR DISC SURGERY     "L4-5; Dr. Trenton Gammon"  . MULTIPLE TOOTH EXTRACTIONS    . ORIF MANDIBULAR FRACTURE N/A 12/19/2015   Procedure: OPEN REDUCTION INTERNAL FIXATION (ORIF) MANDIBULAR FRACTURE;  Surgeon: Izora Gala, MD;  Location: [place];  Service: ENT;  Laterality: N/A;  . PATELLA FRACTURE SURGERY Left 1976   plate to knee cap from accident  . POLYPECTOMY  07/05/2017   Procedure: POLYPECTOMY;  Surgeon: Danie Binder, MD;  [facility];  Service: Endoscopy;;  colon  . SAVORY DILATION  12/28/2010   SLF:(MAC)J-shaped stomach/nodular mocosa in the distal esophagus/empiric dilation 63m  . SAVORY DILATION N/A 03/02/2016   Procedure: SAVORY DILATION;  Surgeon: SDanie Binder MD;  [facility];  Service: Endoscopy;  Laterality: N/A;    MEDICATIONS: . [START ON 03/06/2019] ceFAZolin (ANCEF) 3 g in dextrose 5 % 50 mL IVPB   . albuterol (PROVENTIL HFA;VENTOLIN HFA) 108 (90  Base) MCG/ACT inhaler  . albuterol (PROVENTIL) (2.5 MG/3ML) 0.083% nebulizer solution  . alprazolam (XANAX) 2 MG tablet  . amLODipine (NORVASC) 5 MG tablet  . aspirin-sod bicarb-citric acid (ALKA-SELTZER) 325 MG TBEF tablet  . atorvastatin (LIPITOR) 80 MG tablet  . beclomethasone (QVAR) 80 MCG/ACT inhaler  . cetirizine (ZYRTEC) 10 MG tablet  . clopidogrel (PLAVIX) 75 MG tablet  . dexlansoprazole (DEXILANT) 60 MG capsule  . Evolocumab (REPATHA SURECLICK) 038 MG/ML SOAJ  . ezetimibe (ZETIA) 10 MG tablet  . fluticasone (FLONASE) 50 MCG/ACT nasal spray  . Fluticasone Furoate (ARNUITY ELLIPTA) 200 MCG/ACT AEPB  . furosemide (LASIX) 40 MG tablet  . glipiZIDE (GLUCOTROL) 10 MG tablet  . hydrochlorothiazide (HYDRODIURIL) 25 MG tablet  . ipratropium (ATROVENT) 0.02 % nebulizer solution  . lisinopril (PRINIVIL,ZESTRIL) 10 MG tablet  . Liver Extract (LIVER PO)  . magnesium citrate SOLN  . metFORMIN (GLUCOPHAGE-XR) 500 MG 24 hr tablet  . metoprolol tartrate (LOPRESSOR) 25 MG tablet  .  Multiple Vitamin (MULTIVITAMIN WITH MINERALS) TABS tablet  . Multiple Vitamins-Minerals (EMERGEN-C IMMUNE) PACK  . naloxegol oxalate (MOVANTIK) 25 MG TABS tablet  . NARCAN 4 MG/0.1ML LIQD nasal spray kit  . nitroGLYCERIN (NITROSTAT) 0.4 MG SL tablet  . oxyCODONE-acetaminophen (PERCOCET) 10-325 MG tablet  . potassium chloride SA (KLOR-CON) 20 MEQ tablet  . Pseudoeph-Doxylamine-DM-APAP (NYQUIL PO)  . Pseudoephedrine-APAP-DM (DAYQUIL PO)  . ranolazine (RANEXA) 500 MG 12 hr tablet  . sertraline (ZOLOFT) 100 MG tablet  . sitaGLIPtin (JANUVIA) 100 MG tablet  . tamsulosin (FLOMAX) 0.4 MG CAPS capsule  . Vitamin D, Ergocalciferol, (DRISDOL) 1.25 MG (50000 UT) CAPS capsule  . LINZESS 290 MCG CAPS capsule  . metFORMIN (GLUCOPHAGE) 500 MG tablet  . potassium chloride (K-DUR,KLOR-CON) 10 MEQ tablet   Wynonia Musty [facility] Short Stay Center/Anesthesiology Phone [phone] 03/05/2019 3:23 PM

## 2019-03-03 LAB — NOVEL CORONAVIRUS, NAA (HOSP ORDER, SEND-OUT TO REF LAB; TAT 18-24 HRS): SARS-CoV-2, NAA: NOT DETECTED

## 2019-03-05 ENCOUNTER — Encounter (HOSPITAL_COMMUNITY): Payer: Self-pay | Admitting: Neurosurgery

## 2019-03-05 ENCOUNTER — Other Ambulatory Visit: Payer: Self-pay

## 2019-03-05 ENCOUNTER — Telehealth: Payer: Self-pay | Admitting: *Deleted

## 2019-03-05 MED ORDER — DEXTROSE 5 % IV SOLN
3.0000 g | INTRAVENOUS | Status: AC
  Administered 2019-03-06: 11:00:00 3 g via INTRAVENOUS
  Filled 2019-03-05: qty 3

## 2019-03-05 NOTE — Progress Notes (Signed)
Patient denies shortness of breath, fever, cough and chest pain.  PCP - Dr Jeanella Anton Cardiologist - Dr Cornelia Copa - [place] Field, NP  Chest x-ray - denies EKG - 03/06/19 DOS Stress Test - 11/01/18 ECHO - 06/30/17 Cardiac Cath - denies  Fasting Blood Sugar - 100s Checks Blood Sugar _2_ times a day  . Do not take oral diabetes medicines (Metformin, gilpizide, januvia) the morning of surgery.  . If your blood sugar is less than 70 mg/dL, you will need to treat for low blood sugar: o Treat a low blood sugar (less than 70 mg/dL) with  cup of clear juice (cranberry or apple), 4 glucose tablets. o Recheck blood sugar in 15 minutes after treatment (to make sure it is greater than 70 mg/dL). If your blood sugar is not greater than 70 mg/dL on recheck, call [phone] for further instructions.  Blood Thinner Instructions:  Plavix last dose was on 02/28/19 per patient.  Anesthesia review: Yes  STOP now taking any Aspirin (unless otherwise instructed by your surgeon), Aleve, Naproxen, Ibuprofen, Motrin, Advil, Goody's, BC's, all herbal medications, fish oil, and all vitamins.   Coronavirus Screening Covid test on 03/02/19 was negative.  Patient verbalized understanding of instructions that were given via phone.

## 2019-03-05 NOTE — Telephone Encounter (Signed)
Typically plavix would be held 5 days prior to most surgeries. If they have already accounted for this or in  general perform the surgery on plavix then ok to proceed with surgery otherwise from a cardiac standpoint   Zandra Abts MD

## 2019-03-05 NOTE — Telephone Encounter (Signed)
Routed this communication to Dr Annette Stable

## 2019-03-05 NOTE — Telephone Encounter (Signed)
[facility] wanted Dr Harl Bowie input on pt surgery tomorrow with Dr Trenton Gammon ([place])

## 2019-03-05 NOTE — Anesthesia Preprocedure Evaluation (Addendum)
Anesthesia Evaluation  Patient identified by MRN, date of birth, ID band Patient awake    Reviewed: Allergy & Precautions, NPO status , Patient's Chart, lab work & pertinent test results, reviewed documented beta blocker date and time   Airway Mallampati: III  TM Distance: >3 FB Neck ROM: Limited    Dental  (+) Dental Advisory Given, Edentulous Lower, Edentulous Upper   Pulmonary asthma , COPD,  COPD inhaler,    Pulmonary exam normal breath sounds clear to auscultation       Cardiovascular hypertension, Pt. on home beta blockers and Pt. on medications (-) angina+ CAD, + Past MI, + CABG (s/p CABG LIMA-LAD in 2002), + Peripheral Vascular Disease, +CHF and + DVT  Normal cardiovascular exam Rhythm:Regular Rate:Normal  Echo 07/10/2017: - Left ventricle: The cavity size was normal. Wall thickness was increased in a pattern of mild LVH. Systolic function was normal. The estimated ejection fraction was in the range of 60% to 65%.   Neuro/Psych  Headaches, Seizures -,  PSYCHIATRIC DISORDERS Anxiety Depression Bipolar Disorder  Neuromuscular disease    GI/Hepatic Neg liver ROS, PUD, GERD  ,  Endo/Other  diabetes, Type 2, Oral Hypoglycemic AgentsMorbid obesity  Renal/GU negative Renal ROS     Musculoskeletal  (+) Arthritis , Rheumatoid disorders,    Abdominal   Peds  Hematology  (+) Blood dyscrasia (Plavix), ,   Anesthesia Other Findings Day of surgery medications reviewed with the patient.  Reproductive/Obstetrics                          Anesthesia Physical Anesthesia Plan  ASA: III  Anesthesia Plan: General   Post-op Pain Management:    Induction: Intravenous  PONV Risk Score and Plan: 3 and Midazolam, Dexamethasone and Ondansetron  Airway Management Planned: Oral ETT and Video Laryngoscope Planned  Additional Equipment:   Intra-op Plan:   Post-operative Plan: Extubation in OR  Informed  Consent: I have reviewed the patients History and Physical, chart, labs and discussed the procedure including the risks, benefits and alternatives for the proposed anesthesia with the patient or authorized representative who has indicated his/her understanding and acceptance.     Dental advisory given  Plan Discussed with: CRNA  Anesthesia Plan Comments: (See PAT note by Karoline Caldwell, PA-C  2nd PIV)     Anesthesia Quick Evaluation

## 2019-03-06 ENCOUNTER — Other Ambulatory Visit: Payer: Self-pay

## 2019-03-06 ENCOUNTER — Encounter (HOSPITAL_COMMUNITY): Payer: Self-pay | Admitting: Neurosurgery

## 2019-03-06 ENCOUNTER — Ambulatory Visit (HOSPITAL_COMMUNITY): Payer: Medicare Other | Admitting: Physician Assistant

## 2019-03-06 ENCOUNTER — Ambulatory Visit (HOSPITAL_COMMUNITY)
Admission: RE | Admit: 2019-03-06 | Discharge: 2019-03-07 | Disposition: A | Discharge status: Home / Self Care | Payer: Medicare Other | Attending: Neurosurgery | Admitting: Neurosurgery

## 2019-03-06 ENCOUNTER — Ambulatory Visit (HOSPITAL_COMMUNITY): Payer: Medicare Other

## 2019-03-06 ENCOUNTER — Encounter (HOSPITAL_COMMUNITY)
Admission: RE | Disposition: A | Discharge status: Home / Self Care | Payer: Self-pay | Source: Home / Self Care | Attending: Neurosurgery

## 2019-03-06 DIAGNOSIS — I509 Heart failure, unspecified: Secondary | ICD-10-CM | POA: Insufficient documentation

## 2019-03-06 DIAGNOSIS — Z79899 Other long term (current) drug therapy: Secondary | ICD-10-CM | POA: Diagnosis not present

## 2019-03-06 DIAGNOSIS — Z886 Allergy status to analgesic agent status: Secondary | ICD-10-CM | POA: Insufficient documentation

## 2019-03-06 DIAGNOSIS — I252 Old myocardial infarction: Secondary | ICD-10-CM | POA: Insufficient documentation

## 2019-03-06 DIAGNOSIS — M5126 Other intervertebral disc displacement, lumbar region: Secondary | ICD-10-CM | POA: Diagnosis present

## 2019-03-06 DIAGNOSIS — E1140 Type 2 diabetes mellitus with diabetic neuropathy, unspecified: Secondary | ICD-10-CM | POA: Insufficient documentation

## 2019-03-06 DIAGNOSIS — Z7982 Long term (current) use of aspirin: Secondary | ICD-10-CM | POA: Insufficient documentation

## 2019-03-06 DIAGNOSIS — I2510 Atherosclerotic heart disease of native coronary artery without angina pectoris: Secondary | ICD-10-CM | POA: Diagnosis not present

## 2019-03-06 DIAGNOSIS — M5116 Intervertebral disc disorders with radiculopathy, lumbar region: Secondary | ICD-10-CM | POA: Insufficient documentation

## 2019-03-06 DIAGNOSIS — Z86718 Personal history of other venous thrombosis and embolism: Secondary | ICD-10-CM | POA: Diagnosis not present

## 2019-03-06 DIAGNOSIS — Z7984 Long term (current) use of oral hypoglycemic drugs: Secondary | ICD-10-CM | POA: Insufficient documentation

## 2019-03-06 DIAGNOSIS — I110 Hypertensive heart disease with heart failure: Secondary | ICD-10-CM | POA: Insufficient documentation

## 2019-03-06 DIAGNOSIS — M069 Rheumatoid arthritis, unspecified: Secondary | ICD-10-CM | POA: Diagnosis not present

## 2019-03-06 DIAGNOSIS — Z888 Allergy status to other drugs, medicaments and biological substances status: Secondary | ICD-10-CM | POA: Insufficient documentation

## 2019-03-06 DIAGNOSIS — F319 Bipolar disorder, unspecified: Secondary | ICD-10-CM | POA: Insufficient documentation

## 2019-03-06 DIAGNOSIS — J449 Chronic obstructive pulmonary disease, unspecified: Secondary | ICD-10-CM | POA: Diagnosis not present

## 2019-03-06 DIAGNOSIS — Z419 Encounter for procedure for purposes other than remedying health state, unspecified: Secondary | ICD-10-CM

## 2019-03-06 DIAGNOSIS — Z8249 Family history of ischemic heart disease and other diseases of the circulatory system: Secondary | ICD-10-CM | POA: Diagnosis not present

## 2019-03-06 DIAGNOSIS — Z7951 Long term (current) use of inhaled steroids: Secondary | ICD-10-CM | POA: Insufficient documentation

## 2019-03-06 DIAGNOSIS — F419 Anxiety disorder, unspecified: Secondary | ICD-10-CM | POA: Diagnosis not present

## 2019-03-06 DIAGNOSIS — Z833 Family history of diabetes mellitus: Secondary | ICD-10-CM | POA: Diagnosis not present

## 2019-03-06 DIAGNOSIS — Z951 Presence of aortocoronary bypass graft: Secondary | ICD-10-CM | POA: Insufficient documentation

## 2019-03-06 DIAGNOSIS — Z7902 Long term (current) use of antithrombotics/antiplatelets: Secondary | ICD-10-CM | POA: Diagnosis not present

## 2019-03-06 DIAGNOSIS — E7800 Pure hypercholesterolemia, unspecified: Secondary | ICD-10-CM | POA: Diagnosis not present

## 2019-03-06 HISTORY — DX: Chronic obstructive pulmonary disease, unspecified: J44.9

## 2019-03-06 HISTORY — DX: Myoneural disorder, unspecified: G70.9

## 2019-03-06 HISTORY — PX: LUMBAR LAMINECTOMY/DECOMPRESSION MICRODISCECTOMY: SHX5026

## 2019-03-06 LAB — GLUCOSE, CAPILLARY
Glucose-Capillary: 102 mg/dL — ABNORMAL HIGH (ref 70–99)
Glucose-Capillary: 98 mg/dL (ref 70–99)
Glucose-Capillary: 117 mg/dL — ABNORMAL HIGH (ref 70–99)
Glucose-Capillary: 125 mg/dL — ABNORMAL HIGH (ref 70–99)
Glucose-Capillary: 239 mg/dL — ABNORMAL HIGH (ref 70–99)

## 2019-03-06 LAB — BASIC METABOLIC PANEL
Anion gap: 7 (ref 5–15)
BUN: 9 mg/dL (ref 6–20)
CO2: 28 mmol/L (ref 22–32)
Calcium: 9.3 mg/dL (ref 8.9–10.3)
Chloride: 104 mmol/L (ref 98–111)
Creatinine, Ser: 0.96 mg/dL (ref 0.61–1.24)
GFR calc Af Amer: >60 mL/min (ref >60)
GFR calc non Af Amer: >60 mL/min (ref >60)
Glucose, Bld: 111 mg/dL — ABNORMAL HIGH (ref 70–99)
Potassium: 4.1 mmol/L (ref 3.5–5.1)
Sodium: 139 mmol/L (ref 135–145)

## 2019-03-06 LAB — HEMOGLOBIN A1C
Hgb A1c MFr Bld: 6.1 % — ABNORMAL HIGH (ref 4.8–5.6)
Mean Plasma Glucose: 128.37 mg/dL

## 2019-03-06 LAB — CBC
HCT: 40.1 % (ref 39.0–52.0)
Hemoglobin: 13.3 g/dL (ref 13.0–17.0)
MCH: 29.7 pg (ref 26.0–34.0)
MCHC: 33.2 g/dL (ref 30.0–36.0)
MCV: 89.5 fL (ref 80.0–100.0)
Platelets: 218 10*3/uL (ref 150–400)
RBC: 4.48 MIL/uL (ref 4.22–5.81)
RDW: 13.6 % (ref 11.5–15.5)
WBC: 6.1 10*3/uL (ref 4.0–10.5)
nRBC: 0 % (ref 0.0–0.2)

## 2019-03-06 LAB — ABO/RH: ABO/RH(D): B POS

## 2019-03-06 LAB — TYPE AND SCREEN
ABO/RH(D): B POS
Antibody Screen: NEGATIVE

## 2019-03-06 SURGERY — LUMBAR LAMINECTOMY/DECOMPRESSION MICRODISCECTOMY 1 LEVEL
Anesthesia: General | Site: Back | Laterality: Right

## 2019-03-06 MED ORDER — METOPROLOL TARTRATE 12.5 MG HALF TABLET
ORAL_TABLET | ORAL | Status: AC
  Administered 2019-03-06: 12.5 mg via ORAL
  Filled 2019-03-06: qty 1

## 2019-03-06 MED ORDER — LISINOPRIL 10 MG PO TABS
10.0000 mg | ORAL_TABLET | Freq: Every day | ORAL | Status: DC
  Administered 2019-03-06: 10 mg via ORAL
  Filled 2019-03-06: qty 1

## 2019-03-06 MED ORDER — NALOXEGOL OXALATE 25 MG PO TABS
25.0000 mg | ORAL_TABLET | Freq: Every day | ORAL | Status: DC
  Filled 2019-03-06: qty 1

## 2019-03-06 MED ORDER — ADULT MULTIVITAMIN W/MINERALS CH: 1.0000 | ORAL_TABLET | Freq: Every day | ORAL | Status: DC

## 2019-03-06 MED ORDER — ALBUTEROL SULFATE HFA 108 (90 BASE) MCG/ACT IN AERS
INHALATION_SPRAY | RESPIRATORY_TRACT | Status: AC
  Filled 2019-03-06: qty 6.7

## 2019-03-06 MED ORDER — METOPROLOL TARTRATE 12.5 MG HALF TABLET
12.5000 mg | ORAL_TABLET | Freq: Two times a day (BID) | ORAL | Status: DC
  Administered 2019-03-06: 12.5 mg via ORAL
  Filled 2019-03-06: qty 1

## 2019-03-06 MED ORDER — ACETAMINOPHEN 10 MG/ML IV SOLN
INTRAVENOUS | Status: AC
  Filled 2019-03-06: qty 100

## 2019-03-06 MED ORDER — HEMOSTATIC AGENTS (NO CHARGE) OPTIME
TOPICAL | Status: DC | PRN
  Administered 2019-03-06: 1 via TOPICAL

## 2019-03-06 MED ORDER — BUPIVACAINE HCL (PF) 0.25 % IJ SOLN
INTRAMUSCULAR | Status: DC | PRN
  Administered 2019-03-06: 20 mL

## 2019-03-06 MED ORDER — CEFAZOLIN SODIUM-DEXTROSE 1-4 GM/50ML-% IV SOLN
1.0000 g | Freq: Three times a day (TID) | INTRAVENOUS | Status: AC
  Administered 2019-03-06 – 2019-03-07 (×2): 1 g via INTRAVENOUS
  Filled 2019-03-06 (×2): qty 50

## 2019-03-06 MED ORDER — LORATADINE 10 MG PO TABS: 10.0000 mg | ORAL_TABLET | Freq: Every day | ORAL | Status: DC

## 2019-03-06 MED ORDER — 0.9 % SODIUM CHLORIDE (POUR BTL) OPTIME
TOPICAL | Status: DC | PRN
  Administered 2019-03-06: 1000 mL

## 2019-03-06 MED ORDER — THROMBIN 5000 UNITS EX SOLR
CUTANEOUS | Status: DC | PRN
  Administered 2019-03-06 (×2): 5000 [IU] via TOPICAL

## 2019-03-06 MED ORDER — HYDROMORPHONE HCL 1 MG/ML IJ SOLN: 1.0000 mg | INTRAMUSCULAR | Status: DC | PRN

## 2019-03-06 MED ORDER — FENTANYL CITRATE (PF) 250 MCG/5ML IJ SOLN
INTRAMUSCULAR | Status: AC
  Filled 2019-03-06: qty 5

## 2019-03-06 MED ORDER — SODIUM CHLORIDE 0.9% FLUSH
3.0000 mL | Freq: Two times a day (BID) | INTRAVENOUS | Status: DC
  Administered 2019-03-06: 3 mL via INTRAVENOUS

## 2019-03-06 MED ORDER — DEXAMETHASONE SODIUM PHOSPHATE 10 MG/ML IJ SOLN
INTRAMUSCULAR | Status: AC
  Filled 2019-03-06: qty 1

## 2019-03-06 MED ORDER — THROMBIN 5000 UNITS EX SOLR
CUTANEOUS | Status: AC
  Filled 2019-03-06: qty 10000

## 2019-03-06 MED ORDER — PROPOFOL 10 MG/ML IV BOLUS
INTRAVENOUS | Status: AC
  Filled 2019-03-06: qty 40

## 2019-03-06 MED ORDER — AMLODIPINE BESYLATE 5 MG PO TABS
5.0000 mg | ORAL_TABLET | Freq: Every day | ORAL | Status: DC
  Administered 2019-03-06: 5 mg via ORAL
  Filled 2019-03-06: qty 1

## 2019-03-06 MED ORDER — MIDAZOLAM HCL 5 MG/5ML IJ SOLN
INTRAMUSCULAR | Status: DC | PRN
  Administered 2019-03-06: 2 mg via INTRAVENOUS

## 2019-03-06 MED ORDER — METOPROLOL TARTRATE 12.5 MG HALF TABLET: 12.5000 mg | ORAL_TABLET | Freq: Once | ORAL | Status: AC

## 2019-03-06 MED ORDER — ARTIFICIAL TEARS OPHTHALMIC OINT
TOPICAL_OINTMENT | OPHTHALMIC | Status: AC
  Filled 2019-03-06: qty 3.5

## 2019-03-06 MED ORDER — LACTATED RINGERS IV SOLN: INTRAVENOUS | Status: DC

## 2019-03-06 MED ORDER — FENTANYL CITRATE (PF) 100 MCG/2ML IJ SOLN
INTRAMUSCULAR | Status: AC
  Filled 2019-03-06: qty 2

## 2019-03-06 MED ORDER — OXYCODONE-ACETAMINOPHEN 10-325 MG PO TABS: 1.0000 | ORAL_TABLET | ORAL | Status: DC | PRN

## 2019-03-06 MED ORDER — BUDESONIDE 0.5 MG/2ML IN SUSP: 0.5000 mg | Freq: Two times a day (BID) | RESPIRATORY_TRACT | Status: DC

## 2019-03-06 MED ORDER — EZETIMIBE 10 MG PO TABS
10.0000 mg | ORAL_TABLET | Freq: Every day | ORAL | Status: DC
  Administered 2019-03-06: 10 mg via ORAL
  Filled 2019-03-06 (×2): qty 1

## 2019-03-06 MED ORDER — MIDAZOLAM HCL 2 MG/2ML IJ SOLN
INTRAMUSCULAR | Status: AC
  Filled 2019-03-06: qty 2

## 2019-03-06 MED ORDER — LINAGLIPTIN 5 MG PO TABS
5.0000 mg | ORAL_TABLET | Freq: Every day | ORAL | Status: DC
  Administered 2019-03-06: 5 mg via ORAL
  Filled 2019-03-06 (×2): qty 1

## 2019-03-06 MED ORDER — SERTRALINE HCL 50 MG PO TABS
100.0000 mg | ORAL_TABLET | Freq: Every day | ORAL | Status: DC
  Administered 2019-03-06: 100 mg via ORAL
  Filled 2019-03-06: qty 2

## 2019-03-06 MED ORDER — IPRATROPIUM BROMIDE 0.02 % IN SOLN: 0.5000 mg | Freq: Two times a day (BID) | RESPIRATORY_TRACT | Status: DC | PRN

## 2019-03-06 MED ORDER — CHLORHEXIDINE GLUCONATE CLOTH 2 % EX PADS: 6.0000 | MEDICATED_PAD | Freq: Once | CUTANEOUS | Status: DC

## 2019-03-06 MED ORDER — ACETAMINOPHEN 325 MG PO TABS: 650.0000 mg | ORAL_TABLET | ORAL | Status: DC | PRN

## 2019-03-06 MED ORDER — ATORVASTATIN CALCIUM 80 MG PO TABS
80.0000 mg | ORAL_TABLET | Freq: Every evening | ORAL | Status: DC
  Administered 2019-03-06: 80 mg via ORAL
  Filled 2019-03-06: qty 1

## 2019-03-06 MED ORDER — ROCURONIUM BROMIDE 10 MG/ML (PF) SYRINGE
PREFILLED_SYRINGE | INTRAVENOUS | Status: DC | PRN
  Administered 2019-03-06: 10 mg via INTRAVENOUS
  Administered 2019-03-06: 60 mg via INTRAVENOUS

## 2019-03-06 MED ORDER — ALPRAZOLAM 0.5 MG PO TABS
2.0000 mg | ORAL_TABLET | Freq: Two times a day (BID) | ORAL | Status: DC
  Administered 2019-03-06: 2 mg via ORAL
  Filled 2019-03-06: qty 4

## 2019-03-06 MED ORDER — ALBUTEROL SULFATE (2.5 MG/3ML) 0.083% IN NEBU: 2.5000 mg | INHALATION_SOLUTION | Freq: Two times a day (BID) | RESPIRATORY_TRACT | Status: DC

## 2019-03-06 MED ORDER — HYDROCODONE-ACETAMINOPHEN 5-325 MG PO TABS
1.0000 | ORAL_TABLET | ORAL | Status: DC | PRN
  Administered 2019-03-06 – 2019-03-07 (×3): 1 via ORAL
  Filled 2019-03-06 (×3): qty 1

## 2019-03-06 MED ORDER — SODIUM CHLORIDE 0.9 % IV SOLN
INTRAVENOUS | Status: DC | PRN
  Administered 2019-03-06: 500 mL

## 2019-03-06 MED ORDER — ALBUTEROL SULFATE (2.5 MG/3ML) 0.083% IN NEBU
2.5000 mg | INHALATION_SOLUTION | Freq: Two times a day (BID) | RESPIRATORY_TRACT | Status: DC
  Administered 2019-03-06 – 2019-03-07 (×2): 2.5 mg via RESPIRATORY_TRACT
  Filled 2019-03-06 (×2): qty 3

## 2019-03-06 MED ORDER — PHENOL 1.4 % MT LIQD: 1.0000 | OROMUCOSAL | Status: DC | PRN

## 2019-03-06 MED ORDER — SODIUM CHLORIDE 0.9% FLUSH: 3.0000 mL | INTRAVENOUS | Status: DC | PRN

## 2019-03-06 MED ORDER — ACETAMINOPHEN 10 MG/ML IV SOLN
1000.0000 mg | Freq: Once | INTRAVENOUS | Status: AC
  Administered 2019-03-06: 1000 mg via INTRAVENOUS

## 2019-03-06 MED ORDER — HYDROCHLOROTHIAZIDE 25 MG PO TABS: 25.0000 mg | ORAL_TABLET | Freq: Every day | ORAL | Status: DC

## 2019-03-06 MED ORDER — SODIUM CHLORIDE 0.9 % IV SOLN: 250.0000 mL | INTRAVENOUS | Status: DC

## 2019-03-06 MED ORDER — CYCLOBENZAPRINE HCL 10 MG PO TABS
10.0000 mg | ORAL_TABLET | Freq: Three times a day (TID) | ORAL | Status: DC | PRN
  Administered 2019-03-06 – 2019-03-07 (×2): 10 mg via ORAL
  Filled 2019-03-06 (×2): qty 1

## 2019-03-06 MED ORDER — ALBUTEROL SULFATE HFA 108 (90 BASE) MCG/ACT IN AERS: 2.0000 | INHALATION_SPRAY | Freq: Four times a day (QID) | RESPIRATORY_TRACT | Status: DC | PRN

## 2019-03-06 MED ORDER — PROMETHAZINE HCL 25 MG/ML IJ SOLN: 6.2500 mg | INTRAMUSCULAR | Status: DC | PRN

## 2019-03-06 MED ORDER — BUDESONIDE 0.5 MG/2ML IN SUSP
0.5000 mg | Freq: Two times a day (BID) | RESPIRATORY_TRACT | Status: DC
  Administered 2019-03-06 – 2019-03-07 (×2): 0.5 mg via RESPIRATORY_TRACT
  Filled 2019-03-06 (×3): qty 2

## 2019-03-06 MED ORDER — BUPIVACAINE HCL (PF) 0.25 % IJ SOLN
INTRAMUSCULAR | Status: AC
  Filled 2019-03-06: qty 30

## 2019-03-06 MED ORDER — MAGNESIUM CITRATE PO SOLN: 1.0000 | ORAL | Status: DC | PRN

## 2019-03-06 MED ORDER — DEXAMETHASONE SODIUM PHOSPHATE 10 MG/ML IJ SOLN
INTRAMUSCULAR | Status: DC | PRN
  Administered 2019-03-06: 10 mg via INTRAVENOUS

## 2019-03-06 MED ORDER — SUGAMMADEX SODIUM 200 MG/2ML IV SOLN
INTRAVENOUS | Status: DC | PRN
  Administered 2019-03-06: 250 mg via INTRAVENOUS

## 2019-03-06 MED ORDER — MENTHOL 3 MG MT LOZG: 1.0000 | LOZENGE | OROMUCOSAL | Status: DC | PRN

## 2019-03-06 MED ORDER — INSULIN ASPART 100 UNIT/ML ~~LOC~~ SOLN
0.0000 [IU] | Freq: Three times a day (TID) | SUBCUTANEOUS | Status: DC
  Administered 2019-03-06 – 2019-03-07 (×2): 2 [IU] via SUBCUTANEOUS

## 2019-03-06 MED ORDER — FLUTICASONE PROPIONATE 50 MCG/ACT NA SUSP
2.0000 | Freq: Every day | NASAL | Status: DC
  Filled 2019-03-06: qty 16

## 2019-03-06 MED ORDER — TAMSULOSIN HCL 0.4 MG PO CAPS: 0.4000 mg | ORAL_CAPSULE | Freq: Every day | ORAL | Status: DC

## 2019-03-06 MED ORDER — ONDANSETRON HCL 4 MG/2ML IJ SOLN
INTRAMUSCULAR | Status: AC
  Filled 2019-03-06: qty 2

## 2019-03-06 MED ORDER — POTASSIUM CHLORIDE CRYS ER 20 MEQ PO TBCR
20.0000 meq | EXTENDED_RELEASE_TABLET | Freq: Two times a day (BID) | ORAL | Status: DC
  Administered 2019-03-06: 20 meq via ORAL
  Filled 2019-03-06: qty 1

## 2019-03-06 MED ORDER — GLIPIZIDE 5 MG PO TABS
10.0000 mg | ORAL_TABLET | Freq: Every day | ORAL | Status: DC
  Administered 2019-03-07: 10 mg via ORAL
  Filled 2019-03-06: qty 2

## 2019-03-06 MED ORDER — FUROSEMIDE 40 MG PO TABS: 40.0000 mg | ORAL_TABLET | Freq: Every day | ORAL | Status: DC

## 2019-03-06 MED ORDER — PROPOFOL 10 MG/ML IV BOLUS
INTRAVENOUS | Status: DC | PRN
  Administered 2019-03-06: 180 mg via INTRAVENOUS

## 2019-03-06 MED ORDER — VITAMIN D (ERGOCALCIFEROL) 1.25 MG (50000 UNIT) PO CAPS: 50000.0000 [IU] | ORAL_CAPSULE | ORAL | Status: DC

## 2019-03-06 MED ORDER — EMERGEN-C IMMUNE PO PACK: 1.0000 | PACK | Freq: Every day | ORAL | Status: DC

## 2019-03-06 MED ORDER — LIDOCAINE 2% (20 MG/ML) 5 ML SYRINGE
INTRAMUSCULAR | Status: AC
  Filled 2019-03-06: qty 5

## 2019-03-06 MED ORDER — PANTOPRAZOLE SODIUM 40 MG PO TBEC
40.0000 mg | DELAYED_RELEASE_TABLET | Freq: Every day | ORAL | Status: DC
  Administered 2019-03-06: 40 mg via ORAL
  Filled 2019-03-06: qty 1

## 2019-03-06 MED ORDER — LIDOCAINE 2% (20 MG/ML) 5 ML SYRINGE
INTRAMUSCULAR | Status: DC | PRN
  Administered 2019-03-06: 100 mg via INTRAVENOUS

## 2019-03-06 MED ORDER — ACETAMINOPHEN 500 MG PO TABS
1000.0000 mg | ORAL_TABLET | Freq: Once | ORAL | Status: AC
  Administered 2019-03-06: 1000 mg via ORAL
  Filled 2019-03-06: qty 2

## 2019-03-06 MED ORDER — ONDANSETRON HCL 4 MG PO TABS: 4.0000 mg | ORAL_TABLET | Freq: Four times a day (QID) | ORAL | Status: DC | PRN

## 2019-03-06 MED ORDER — EVOLOCUMAB 140 MG/ML ~~LOC~~ SOAJ: 140.0000 mg | SUBCUTANEOUS | Status: DC

## 2019-03-06 MED ORDER — METFORMIN HCL ER 500 MG PO TB24
1000.0000 mg | ORAL_TABLET | Freq: Every day | ORAL | Status: DC
  Administered 2019-03-07: 1000 mg via ORAL
  Filled 2019-03-06: qty 2

## 2019-03-06 MED ORDER — FENTANYL CITRATE (PF) 100 MCG/2ML IJ SOLN
INTRAMUSCULAR | Status: DC | PRN
  Administered 2019-03-06: 150 ug via INTRAVENOUS
  Administered 2019-03-06: 50 ug via INTRAVENOUS

## 2019-03-06 MED ORDER — RANOLAZINE ER 500 MG PO TB12
500.0000 mg | ORAL_TABLET | Freq: Two times a day (BID) | ORAL | Status: DC
  Administered 2019-03-06: 500 mg via ORAL
  Filled 2019-03-06 (×2): qty 1

## 2019-03-06 MED ORDER — ONDANSETRON HCL 4 MG/2ML IJ SOLN: 4.0000 mg | Freq: Four times a day (QID) | INTRAMUSCULAR | Status: DC | PRN

## 2019-03-06 MED ORDER — ONDANSETRON HCL 4 MG/2ML IJ SOLN
INTRAMUSCULAR | Status: DC | PRN
  Administered 2019-03-06: 4 mg via INTRAVENOUS

## 2019-03-06 MED ORDER — ACETAMINOPHEN 650 MG RE SUPP: 650.0000 mg | RECTAL | Status: DC | PRN

## 2019-03-06 MED ORDER — NALOXONE HCL 4 MG/0.1ML NA LIQD: 1.0000 | NASAL | Status: DC | PRN

## 2019-03-06 MED ORDER — FENTANYL CITRATE (PF) 100 MCG/2ML IJ SOLN
25.0000 ug | INTRAMUSCULAR | Status: DC | PRN
  Administered 2019-03-06 (×2): 50 ug via INTRAVENOUS

## 2019-03-06 SURGICAL SUPPLY — 58 items
ADH SKN CLS APL DERMABOND .7 (GAUZE/BANDAGES/DRESSINGS) ×1
APL SKNCLS STERI-STRIP NONHPOA (GAUZE/BANDAGES/DRESSINGS) ×1
BAG DECANTER FOR FLEXI CONT (MISCELLANEOUS) ×3 IMPLANT
BAND INSRT 18 STRL LF DISP RB (MISCELLANEOUS) ×2
BAND RUBBER #18 3X1/16 STRL (MISCELLANEOUS) ×6 IMPLANT
BENZOIN TINCTURE PRP APPL 2/3 (GAUZE/BANDAGES/DRESSINGS) ×3 IMPLANT
BLADE CLIPPER SURG (BLADE) ×2 IMPLANT
BUR CUTTER 7.0 ROUND (BURR) ×3 IMPLANT
CANISTER SUCT 3000ML PPV (MISCELLANEOUS) ×3 IMPLANT
CARTRIDGE OIL MAESTRO DRILL (MISCELLANEOUS) ×1 IMPLANT
CLOSURE WOUND 1/2 X4 (GAUZE/BANDAGES/DRESSINGS) ×1
COVER WAND RF STERILE (DRAPES) ×1 IMPLANT
DECANTER SPIKE VIAL GLASS SM (MISCELLANEOUS) ×3 IMPLANT
DERMABOND ADVANCED (GAUZE/BANDAGES/DRESSINGS) ×2
DERMABOND ADVANCED .7 DNX12 (GAUZE/BANDAGES/DRESSINGS) ×1 IMPLANT
DIFFUSER DRILL AIR PNEUMATIC (MISCELLANEOUS) ×3 IMPLANT
DRAPE HALF SHEET 40X57 (DRAPES) ×2 IMPLANT
DRAPE LAPAROTOMY 100X72X124 (DRAPES) ×3 IMPLANT
DRAPE MICROSCOPE LEICA (MISCELLANEOUS) ×3 IMPLANT
DRAPE SURG 17X23 STRL (DRAPES) ×6 IMPLANT
DRSG OPSITE POSTOP 3X4 (GAUZE/BANDAGES/DRESSINGS) ×2 IMPLANT
DRSG OPSITE POSTOP 4X6 (GAUZE/BANDAGES/DRESSINGS) ×2 IMPLANT
DURAPREP 26ML APPLICATOR (WOUND CARE) ×3 IMPLANT
ELECT REM PT RETURN 9FT ADLT (ELECTROSURGICAL) ×3
ELECTRODE REM PT RTRN 9FT ADLT (ELECTROSURGICAL) ×1 IMPLANT
GAUZE 4X4 16PLY RFD (DISPOSABLE) IMPLANT
GAUZE SPONGE 4X4 12PLY STRL (GAUZE/BANDAGES/DRESSINGS) ×1 IMPLANT
GLOVE BIO SURGEON STRL SZ 6.5 (GLOVE) ×2 IMPLANT
GLOVE BIO SURGEONS STRL SZ 6.5 (GLOVE) ×1
GLOVE BIOGEL PI IND STRL 6.5 (GLOVE) ×1 IMPLANT
GLOVE BIOGEL PI IND STRL 7.0 (GLOVE) IMPLANT
GLOVE BIOGEL PI INDICATOR 6.5 (GLOVE) ×2
GLOVE BIOGEL PI INDICATOR 7.0 (GLOVE) ×2
GLOVE ECLIPSE 9.0 STRL (GLOVE) ×3 IMPLANT
GLOVE EXAM NITRILE XL STR (GLOVE) IMPLANT
GLOVE SURG SS PI 7.0 STRL IVOR (GLOVE) ×6 IMPLANT
GLOVE SURG SS PI 7.5 STRL IVOR (GLOVE) ×2 IMPLANT
GOWN STRL REUS W/ TWL LRG LVL3 (GOWN DISPOSABLE) IMPLANT
GOWN STRL REUS W/ TWL XL LVL3 (GOWN DISPOSABLE) ×1 IMPLANT
GOWN STRL REUS W/TWL 2XL LVL3 (GOWN DISPOSABLE) IMPLANT
GOWN STRL REUS W/TWL LRG LVL3 (GOWN DISPOSABLE) ×3
GOWN STRL REUS W/TWL XL LVL3 (GOWN DISPOSABLE) ×6
KIT BASIN OR (CUSTOM PROCEDURE TRAY) ×3 IMPLANT
KIT TURNOVER KIT B (KITS) ×3 IMPLANT
NDL SPNL 22GX3.5 QUINCKE BK (NEEDLE) IMPLANT
NEEDLE HYPO 22GX1.5 SAFETY (NEEDLE) ×3 IMPLANT
NEEDLE SPNL 22GX3.5 QUINCKE BK (NEEDLE) ×3 IMPLANT
NS IRRIG 1000ML POUR BTL (IV SOLUTION) ×3 IMPLANT
OIL CARTRIDGE MAESTRO DRILL (MISCELLANEOUS) ×3
PACK LAMINECTOMY NEURO (CUSTOM PROCEDURE TRAY) ×3 IMPLANT
PAD ARMBOARD 7.5X6 YLW CONV (MISCELLANEOUS) ×15 IMPLANT
SPONGE SURGIFOAM ABS GEL SZ50 (HEMOSTASIS) ×3 IMPLANT
STRIP CLOSURE SKIN 1/2X4 (GAUZE/BANDAGES/DRESSINGS) ×2 IMPLANT
SUT VIC AB 2-0 CT1 18 (SUTURE) ×3 IMPLANT
SUT VIC AB 3-0 SH 8-18 (SUTURE) ×3 IMPLANT
TOWEL GREEN STERILE (TOWEL DISPOSABLE) ×3 IMPLANT
TOWEL GREEN STERILE FF (TOWEL DISPOSABLE) ×3 IMPLANT
WATER STERILE IRR 1000ML POUR (IV SOLUTION) ×3 IMPLANT

## 2019-03-06 NOTE — Op Note (Signed)
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  Postoperative diagnosis: Same  Procedure Name: Right L3-4 extraforaminal microdiscectomy  Surgeon:Nakoma Gotwalt A.Kayla Deshaies, M.D.  Asst. Surgeon: Reinaldo Meeker, NP  Anesthesia: General  Indication: 59 year old male with severe right lower extremity pain consistent with a right-sided L3 neuropathy which is failed conservative management.  Work-up demonstrates evidence of a large right-sided L3-4 extraforaminal disc herniation.  Patient presents now for extraforaminal microdiscectomy in hopes of improving his symptoms.  Operative note: After induction anesthesia, patient position prone on the Wilson frame and properly padded.  Lumbar region prepped and draped sterilely.  Incision made overlying L3-4.  Dissection performed on the right.  Retractor placed.  X-ray taken.  Level confirmed.  Extraforaminal approach using high-speed drill and Kerrison rongeurs was performed.  Intertransverse ligament was elevated and resected.  Microscope brought in field is microdissection of the extraforaminal space.  Right-sided L3 nerve root was identified.  Disc herniation was identified.  2 very large fragments were removed.  The disc space itself was heaped up and somewhat torn.  This was debrided using pituitary microrongeurs.  All elements the disc herniation appeared to be resected.  There is no evidence of injury to the nerve or thecal sac.  There is no evidence of any residual compression.  Wound is then irrigated with antibiotic solution.  Gelfoam was placed topically for hemostasis.  Wounds and closed in layers with Vicryl sutures.  Steri-Strips and sterile dressing were applied.  No apparent complications.  Patient tolerated the procedure well and he returns to the recovery room postop.

## 2019-03-06 NOTE — Transfer of Care (Signed)
Immediate Anesthesia Transfer of Care Note  Patient: Dalton [name]  Procedure(s) Performed: MICRODISCECTOMY EXTRAFORAMINAL LUMBAR THREE - LUMBAR FOUR RIGHT (Right Back)  Patient Location: PACU  Anesthesia Type:General  Level of Consciousness: awake, oriented and patient cooperative  Airway & Oxygen Therapy: Patient Spontanous Breathing and Patient connected to face mask oxygen  Post-op Assessment: Report given to RN and Post -op Vital signs reviewed and stable  Post vital signs: Reviewed  Last Vitals:  Vitals Value Taken Time  BP 131/84 03/06/19 1313  Temp    Pulse 72 03/06/19 1318  Resp 22 03/06/19 1318  SpO2 100 % 03/06/19 1318  Vitals shown include unvalidated device data.  Last Pain:  Vitals:   03/06/19 0814  PainSc: 10-Worst pain ever      Patients Stated Pain Goal: 4 ([id number])  Complications: No apparent anesthesia complications

## 2019-03-06 NOTE — Anesthesia Procedure Notes (Signed)
Procedure Name: Intubation Date/Time: 03/06/2019 11:10 AM Performed by: Jenne Campus, CRNA Pre-anesthesia Checklist: Patient identified, Emergency Drugs available, Suction available and Patient being monitored Patient Re-evaluated:Patient Re-evaluated prior to induction Oxygen Delivery Method: Circle System Utilized Preoxygenation: Pre-oxygenation with 100% oxygen Induction Type: IV induction Ventilation: Mask ventilation without difficulty Laryngoscope Size: Glidescope and 4 Grade View: Grade I Tube type: Oral Tube size: 7.5 mm Number of attempts: 1 Airway Equipment and Method: Stylet and Oral airway Placement Confirmation: ETT inserted through vocal cords under direct vision,  positive ETCO2 and breath sounds checked- equal and bilateral Secured at: 23 cm Tube secured with: Tape Dental Injury: Teeth and Oropharynx as per pre-operative assessment  Difficulty Due To: Difficult Airway- due to large tongue and Difficult Airway- due to reduced neck mobility

## 2019-03-06 NOTE — Discharge Instructions (Signed)

## 2019-03-06 NOTE — Anesthesia Postprocedure Evaluation (Signed)
Anesthesia Post Note  Patient: KHALEEL FINGERHUT  Procedure(s) Performed: MICRODISCECTOMY EXTRAFORAMINAL LUMBAR THREE - LUMBAR FOUR RIGHT (Right Back)     Patient location during evaluation: PACU Anesthesia Type: General Level of consciousness: awake and alert Pain management: pain level controlled Vital Signs Assessment: post-procedure vital signs reviewed and stable Respiratory status: spontaneous breathing, nonlabored ventilation, respiratory function stable and patient connected to nasal cannula oxygen Cardiovascular status: blood pressure returned to baseline and stable Postop Assessment: no apparent nausea or vomiting Anesthetic complications: no    Last Vitals:  Vitals:   03/06/19 1513 03/06/19 1515  BP: 137/82   Pulse: 65   Resp: 15   Temp:  36.7 C  SpO2: 100%     Last Pain:  Vitals:   03/06/19 1515  PainSc: 2                  Catalina Gravel

## 2019-03-06 NOTE — H&P (Signed)
Dalton [name] is an 59 y.o. male.   Chief Complaint: Right leg pain HPI: 59 year old male with worsening lower back pain with radiation to his right anterior and lateral thigh.  Symptoms aggravated by standing or walking.  Symptoms are relieved by sitting or resting.  Patient's pain associate with some numbness and dysesthesia.  Also some weakness in his right quadriceps muscle.  No left-sided symptoms.  No bowel or bladder dysfunction.  Work-up demonstrates evidence of a large right-sided foraminal extraforaminal disc herniation at L3-4.  Patient presents now for microdiscectomy.  Past Medical History:  Diagnosis Date  . Anemia   . Anxiety   . Asthma   . Bipolar disorder ([place])   . CHF (congestive heart failure) ([place])   . Chronic back pain    Pain Clinic in [place]  . Chronic bronchitis   . Chronic lower back pain   . Congestive heart failure (CHF) ([place])   . COPD (chronic obstructive pulmonary disease) ([place])    per patient 03/05/19  . Coronary artery disease   . Coughing   . Depression   . DVT (deep venous thrombosis) ([place]) ~ 2005   LLE  . Frequency of urination   . GERD (gastroesophageal reflux disease)   . 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 BMSXJDBZ(208.0)    "a few times/week" (12/24/2015)  . High cholesterol   . HNP (herniated nucleus pulposus), cervical   . Hypertension   . Laceration of right hand 11/27/2010  . Laceration of wrist 2007 BIL FOREARMS  . MI (myocardial infarction) ([place])    7, last one was in 2011 (12/24/2015)  . Neuromuscular disorder ([facility])    legs/feet  . Neuropathy    legs/feet  . NSAID-induced gastric ulcer    "Ibuprofen"  . PUD (peptic ulcer disease)    in 1990s, secondary to medication  . Rheumatoid arthritis ([place])    "all over" (12/24/2015)  . Shortness of breath    with exertion  . Tonsillitis, chronic    Dr. Vicki Mallet in [place]  . Type II diabetes mellitus ([place])   .  Wears glasses     Past Surgical History:  Procedure Laterality Date  . ANTERIOR CERVICAL DECOMP/DISCECTOMY FUSION N/A 11/05/2016   Procedure: Anterior Cervical Discectomy and Fusion - Cervical three-Cervical four;  Surgeon: Earnie Larsson, MD;  Location: [facility];  Service: Neurosurgery;  Laterality: N/A;  . BACK SURGERY     x3  . BIOPSY N/A 05/30/2012   Procedure: BIOPSY;  Surgeon: Danie Binder, MD;  Location: [facility];  Service: Endoscopy;  Laterality: N/A;  . BIOPSY  03/02/2016   Procedure: BIOPSY;  Surgeon: Danie Binder, MD;  [facility];  Service: Endoscopy;;  gastric  . CARDIAC CATHETERIZATION  "several"  . CARPAL TUNNEL RELEASE Bilateral   . CATARACT EXTRACTION W/PHACO Right 06/15/2016   Procedure: CATARACT EXTRACTION PHACO AND INTRAOCULAR LENS PLACEMENT (IOC);  Surgeon: Rutherford Guys, MD;  Location: [facility];  Service: Ophthalmology;  Laterality: Right;  CDE: 4.64  . CATARACT EXTRACTION W/PHACO Left 07/13/2016   Procedure: CATARACT EXTRACTION PHACO AND INTRAOCULAR LENS PLACEMENT (IOC);  Surgeon: Rutherford Guys, MD;  Location: [facility];  Service: Ophthalmology;  Laterality: Left;  CDE: 3.15  . COLONOSCOPY  12/28/2010   SLF: (MAC)Internal hemorrhoids/four small colon polyps tubular adenomas. per SLF: colonoscopy 2022  . COLONOSCOPY WITH PROPOFOL N/A 07/05/2017   Procedure: COLONOSCOPY WITH PROPOFOL;  Surgeon: Barney Drain  L, MD;  [facility];  Service: Endoscopy;  Laterality: N/A;  7:30am  . CORONARY ARTERY BYPASS GRAFT  2002   3 vessels  . ESOPHAGOGASTRODUODENOSCOPY N/A 05/30/2012   SLF: UNCONTROLLED GERD DUE TO LIFESTYLE CHOICE/WEIGHT GAIN/MILD Non-erosive gastritis  . ESOPHAGOGASTRODUODENOSCOPY (EGD) WITH PROPOFOL N/A 03/02/2016   Dr. Oneida Alar: Esophagus appeared normal, impaired dilation performed, patchy inflammation with edema and erythema of the entire stomach., Biopsy with H pylori, patient completed Pylera.   [name] Kitchen FRACTURE SURGERY    . LEFT HEART CATHETERIZATION WITH  CORONARY ANGIOGRAM N/A 08/31/2011   Procedure: LEFT HEART CATHETERIZATION WITH CORONARY ANGIOGRAM;  Surgeon: Laverda Page, MD;  Location: [facility] CATH [facility];  Service: Cardiovascular;  Laterality: N/A;  . LUMBAR DISC SURGERY     "L4-5; Dr. Trenton Gammon"  . MULTIPLE TOOTH EXTRACTIONS    . ORIF MANDIBULAR FRACTURE N/A 12/19/2015   Procedure: OPEN REDUCTION INTERNAL FIXATION (ORIF) MANDIBULAR FRACTURE;  Surgeon: Izora Gala, MD;  Location: [facility];  Service: ENT;  Laterality: N/A;  . PATELLA FRACTURE SURGERY Left 1976   plate to knee cap from accident  . POLYPECTOMY  07/05/2017   Procedure: POLYPECTOMY;  Surgeon: Danie Binder, MD;  [facility];  Service: Endoscopy;;  colon  . SAVORY DILATION  12/28/2010   SLF:(MAC)J-shaped stomach/nodular mocosa in the distal esophagus/empiric dilation 38m  . SAVORY DILATION N/A 03/02/2016   Procedure: SAVORY DILATION;  Surgeon: SDanie Binder MD;  [facility];  Service: Endoscopy;  Laterality: N/A;    Family History  Problem Relation Age of Onset  . Diabetes Mother   . Hypertension Mother   . Heart attack Mother 616 . Hypertension Father   . Diabetes Father   . Heart attack Father 593 . Heart attack Other        mother, father, brother, sister all deceased due to MI  . Heart attack Sister   . Heart attack Brother   . Seizures Brother   . Heart failure Other   . Colon cancer Neg Hx   . Liver disease Neg Hx   . Anesthesia problems Neg Hx   . Hypotension Neg Hx   . Malignant hyperthermia Neg Hx   . Pseudochol deficiency Neg Hx   . Colon polyps Neg Hx    Social History:  reports that he has never smoked. He has never used smokeless tobacco. He reports previous drug use. Drugs: "Crack" cocaine, Cocaine, and Marijuana. He reports that he does not drink alcohol.  Allergies:  Allergies  Allergen Reactions  . Gabapentin Hives  . Ibuprofen Other (See Comments)    Stomach  Upset and stomach ulcers  . Zolpidem Tartrate Other (See  Comments)    Hallucinations   . Naproxen Other (See Comments)    HALLUCINATIONS    Medications Prior to Admission  Medication Sig Dispense Refill  . albuterol (PROVENTIL HFA;VENTOLIN HFA) 108 (90 Base) MCG/ACT inhaler Inhale 2 puffs into the lungs every 6 (six) hours as needed for wheezing or shortness of breath.     .[name] Kitchenalbuterol (PROVENTIL) (2.5 MG/3ML) 0.083% nebulizer solution Take 2.5 mg by nebulization 2 (two) times daily.    .[name] Kitchenalprazolam (XANAX) 2 MG tablet Take 2 mg by mouth 2 (two) times daily.    .[name] KitchenamLODipine (NORVASC) 5 MG tablet Take 1 tablet (5 mg total) by mouth daily. 90 tablet 1  . aspirin-sod bicarb-citric acid (ALKA-SELTZER) 325 MG TBEF tablet Take 650 mg by mouth every 6 (six) hours as  needed (immune support).    [name] Kitchen atorvastatin (LIPITOR) 80 MG tablet Take 1 tablet (80 mg total) by mouth daily. (Patient taking differently: Take 80 mg by mouth every evening. ) 90 tablet 3  . beclomethasone (QVAR) 80 MCG/ACT inhaler Inhale 2 puffs into the lungs 2 (two) times daily as needed (respiratory issues.).     [name] Kitchen cetirizine (ZYRTEC) 10 MG tablet Take 10 mg by mouth daily as needed for allergies.   0  . clopidogrel (PLAVIX) 75 MG tablet Take 75 mg by mouth daily.    [name] Kitchen dexlansoprazole (DEXILANT) 60 MG capsule Take 1 capsule (60 mg total) by mouth daily. 30 capsule 5  . Evolocumab (REPATHA SURECLICK) 559 MG/ML SOAJ Inject 140 mg into the skin every 14 (fourteen) days. 2 pen 11  . ezetimibe (ZETIA) 10 MG tablet Take 10 mg by mouth daily.    . fluticasone (FLONASE) 50 MCG/ACT nasal spray Place 2 sprays into both nostrils daily.     . Fluticasone Furoate (ARNUITY ELLIPTA) 200 MCG/ACT AEPB Inhale 1 puff into the lungs daily.    . furosemide (LASIX) 40 MG tablet Take 1 tablet (40 mg total) by mouth daily. 90 tablet 1  . glipiZIDE (GLUCOTROL) 10 MG tablet Take 10 mg by mouth daily before breakfast.    . hydrochlorothiazide (HYDRODIURIL) 25 MG tablet Take 25 mg by mouth daily.    [name] Kitchen ipratropium  (ATROVENT) 0.02 % nebulizer solution Take 0.5 mg by nebulization 2 (two) times daily as needed (for congestion).    [name] Kitchen lisinopril (PRINIVIL,ZESTRIL) 10 MG tablet Take 10 mg by mouth daily.    . Liver Extract (LIVER PO) Take 1 tablet by mouth daily. "Liver Aid"    . magnesium citrate SOLN Take 1 Bottle by mouth as needed for severe constipation.    . metFORMIN (GLUCOPHAGE-XR) 500 MG 24 hr tablet Take 1,000 mg by mouth daily with breakfast.    . metoprolol tartrate (LOPRESSOR) 25 MG tablet Take 0.5 tablets (12.5 mg total) by mouth 2 (two) times daily. 30 tablet 0  . Multiple Vitamin (MULTIVITAMIN WITH MINERALS) TABS tablet Take 1 tablet by mouth daily.    . Multiple Vitamins-Minerals (EMERGEN-C IMMUNE) PACK Take 1 packet by mouth daily.    . naloxegol oxalate (MOVANTIK) 25 MG TABS tablet Take 1 tablet (25 mg total) by mouth daily. 30 tablet 3  . NARCAN 4 MG/0.1ML LIQD nasal spray kit Place 1 spray into the nose as needed (opioid overdose).   0  . nitroGLYCERIN (NITROSTAT) 0.4 MG SL tablet SEE NOTES (Patient taking differently: Place 0.4 mg under the tongue every 5 (five) minutes as needed for chest pain. ) 100 tablet 3  . oxyCODONE-acetaminophen (PERCOCET) 10-325 MG tablet Take 1 tablet by mouth every 4 (four) hours as needed for pain.    . potassium chloride SA (KLOR-CON) 20 MEQ tablet Take 20 mEq by mouth 2 (two) times daily.    . Pseudoeph-Doxylamine-DM-APAP (NYQUIL PO) Take 1 Dose by mouth at bedtime as needed (cough).    . Pseudoephedrine-APAP-DM (DAYQUIL PO) Take 1 Dose by mouth daily as needed (cough).    . ranolazine (RANEXA) 500 MG 12 hr tablet Take 500 mg by mouth 2 (two) times daily.    . sertraline (ZOLOFT) 100 MG tablet Take 100 mg by mouth daily.    . sitaGLIPtin (JANUVIA) 100 MG tablet Take 100 mg by mouth daily.    . tamsulosin (FLOMAX) 0.4 MG CAPS capsule Take 1 capsule (0.4 mg total) by mouth daily after  breakfast. 30 capsule 0  . Vitamin D, Ergocalciferol, (DRISDOL) 1.25 MG (50000  UT) CAPS capsule Take 50,000 Units by mouth every 7 (seven) days.    [name] Kitchen LINZESS 290 MCG CAPS capsule TAKE 1 CAPSULE(290 MCG) BY MOUTH DAILY BEFORE BREAKFAST (Patient not taking: No sig reported) 30 capsule 5  . metFORMIN (GLUCOPHAGE) 500 MG tablet Take 1 tablet (500 mg total) by mouth daily with breakfast. (Patient not taking: Reported on 03/01/2019) 30 tablet 0  . potassium chloride (K-DUR,KLOR-CON) 10 MEQ tablet Take 1 tablet (10 mEq total) by mouth daily. (Patient not taking: Reported on 03/01/2019) 30 tablet 0    Results for orders placed or performed during the hospital encounter of 03/06/19 (from the past 48 hour(s))  Glucose, capillary     Status: Abnormal   Collection Time: 03/06/19  7:17 AM  Result Value Ref Range   Glucose-Capillary 102 (H) 70 - 99 mg/dL  Basic metabolic panel     Status: Abnormal   Collection Time: 03/06/19  7:20 AM  Result Value Ref Range   Sodium 139 135 - 145 mmol/L   Potassium 4.1 3.5 - 5.1 mmol/L   Chloride 104 98 - 111 mmol/L   CO2 28 22 - 32 mmol/L   Glucose, Bld 111 (H) 70 - 99 mg/dL   BUN 9 6 - 20 mg/dL   Creatinine, Ser 0.96 0.61 - 1.24 mg/dL   Calcium 9.3 8.9 - 10.3 mg/dL   GFR calc non Af Amer >60 >60 mL/min   GFR calc Af Amer >60 >60 mL/min   Anion gap 7 5 - 15    Comment: Performed at [facility], [place] [address] [place] [address]  CBC     Status: None   Collection Time: 03/06/19  7:20 AM  Result Value Ref Range   WBC 6.1 4.0 - 10.5 K/uL   RBC 4.48 4.22 - 5.81 MIL/uL   Hemoglobin 13.3 13.0 - 17.0 g/dL   HCT 40.1 39.0 - 52.0 %   MCV 89.5 80.0 - 100.0 fL   MCH 29.7 26.0 - 34.0 pg   MCHC 33.2 30.0 - 36.0 g/dL   RDW 13.6 11.5 - 15.5 %   Platelets 218 150 - 400 K/uL   nRBC 0.0 0.0 - 0.2 %    Comment: Performed at [facility], [place] [address], [place], [place] [address]  Type and screen     Status: None   Collection Time: 03/06/19  7:22 AM  Result Value Ref Range   ABO/RH(D) B POS    Antibody Screen NEG    Sample  Expiration      03/09/2019,2359 Performed at [facility], [place] [address], [place], [place] [address]    No results found.  Pertinent items noted in HPI and remainder of comprehensive ROS otherwise negative.  Blood pressure 136/84, pulse 78, temperature 98.3 F (36.8 C), height 5' 10" (1.778 m), weight 121.1 kg, SpO2 95 %.  Patient is awake and alert.  He is oriented and appropriate.  Speech is fluent.  Judgment insight are intact.  Cranial nerve function normal bilateral.  Motor examination extremities reveals weakness of his right-sided quadriceps muscle group grading at 4/5.  Sensory examination with decreased sensation to pinprick and light touch in his right L3 dermatome.  Deep tender versus normal active scepter is right patellar reflex is absent.  Achilles reflexes are hypoactive bilaterally.  Examination head ears eyes nose throat summer.  Chest and abdomen are benign.  Extremities are free from injury or deformity. 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  Plan right L3-4 extraforaminal microdiscectomy.  Risks and benefits of been explained.  Patient wishes to proceed.  Mallie Mussel A  03/06/2019, 8:35 AM

## 2019-03-06 NOTE — Brief Op Note (Signed)
03/06/2019  12:57 PM  PATIENT:  [name]  59 y.o. male  PRE-OPERATIVE DIAGNOSIS:  RADICULOPOTHY LUMBAR REGION  POST-OPERATIVE DIAGNOSIS:  RADICULOPOTHY LUMBAR REGION  PROCEDURE:  Procedure(s) with comments: MICRODISCECTOMY EXTRAFORAMINAL LUMBAR THREE - LUMBAR FOUR RIGHT (Right) - MICRODISCECTOMY EXTRAFORAMINAL LUMBAR THREE - LUMBAR FOUR RIGHT  SURGEON:  Surgeon(s) and Role:    * Earnie Larsson, MD - Primary  PHYSICIAN ASSISTANT:   ASSISTANTS: [place]<NP   ANESTHESIA:   general  EBL:  25 mL   BLOOD ADMINISTERED:none  DRAINS: none   LOCAL MEDICATIONS USED:  MARCAINE     SPECIMEN:  No Specimen  DISPOSITION OF SPECIMEN:  N/A  COUNTS:  YES  TOURNIQUET:  * No tourniquets in log *  DICTATION: .Dragon Dictation  PLAN OF CARE: Discharge to home after PACU  PATIENT DISPOSITION:  PACU - hemodynamically stable.   Delay start of Pharmacological VTE agent (>24hrs) due to surgical blood loss or risk of bleeding: yes

## 2019-03-07 DIAGNOSIS — M5116 Intervertebral disc disorders with radiculopathy, lumbar region: Secondary | ICD-10-CM | POA: Diagnosis not present

## 2019-03-07 LAB — GLUCOSE, CAPILLARY: Glucose-Capillary: 136 mg/dL — ABNORMAL HIGH (ref 70–99)

## 2019-03-07 NOTE — Plan of Care (Signed)
Pt doing well. Pt given D/C instructions with verbal understanding. Incision is clean and dry with no sign of infection. Pt's IV was removed prior to D/C. [facility] was notified to transport Pt home. Pt is stable @ D/C and has no other needs at this time. Pt D/C'd home via wheelchair per MD order. Holli Humbles, RN

## 2019-03-07 NOTE — Evaluation (Signed)
Occupational Therapy Evaluation Patient Details Name: Dalton [name] MRN: [id number] DOB: [birth year] Today's Date: 03/07/2019    History of Present Illness Pt is a 59 y/o male now s/p Right L3-4 extraforaminal microdiscectomy. PMHx includes bipolar disorder, CHF, COPD, HTN, seizure, DVT, MI, hx previous back surgery, ACDF   Clinical Impression   This 59 y/o male presents with the above. PTA pt reports use of SPC for functional mobility, reports he has an Therapist, sports who assists 7 days/wk for iADL and intermittently with ADL tasks. Pt completing functional mobility both in room and hallway using [place], completing UB/LB ADL overall at minguard-supervision level throughout. Pt requires increased time for task performance but with no LOB noted during ADL/mobility tasks. Educated pt re: back precautions, safety and compensatory techniques for completing ADL and functional transfers during session; pt requiring intermittent cues for adhering to back precautions during functional tasks this session. Pt reports plans to return home with available assist from son who he lives with and daily assist from RN. Will continue to follow while pt remains in acute setting to maximize his safety and independence with ADL and mobility.     Follow Up Recommendations  No OT follow up;Supervision/Assistance - 24 hour(24hr initially)    Equipment Recommendations  None recommended by OT(DME needs are met)           Precautions / Restrictions Precautions Precautions: Fall;Back Precaution Booklet Issued: Yes (comment) Precaution Comments: issued and reviewed with pt Required Braces or Orthoses: ("no brace needed" order) Restrictions Weight Bearing Restrictions: No      Mobility Bed Mobility               General bed mobility comments: seated EOB upon arrival, verbally reviewed log roll technique  Transfers Overall transfer level: Needs assistance Equipment used: Straight cane Transfers: Sit to/from  Stand Sit to Stand: Supervision         General transfer comment: for safety and balance, no physical assist required, performed multiple sit<>stand from EOB    Balance Overall balance assessment: Needs assistance Sitting-balance support: Feet supported Sitting balance-Leahy Scale: Good     Standing balance support: Single extremity supported;During functional activity Standing balance-Leahy Scale: Fair Standing balance comment: pt able to maintain standing balance during LB dressing without difficulty, use of SPC/single UE support with additional mobility                            ADL either performed or assessed with clinical judgement   ADL Overall ADL's : Needs assistance/impaired Eating/Feeding: Modified independent;Sitting   Grooming: Min guard;Supervision/safety;Standing   Upper Body Bathing: Set up;Sitting   Lower Body Bathing: Min guard;Sit to/from stand   Upper Body Dressing : Sitting;Modified independent Upper Body Dressing Details (indicate cue type and reason): donning button up shirt Lower Body Dressing: Supervision/safety;Sit to/from stand Lower Body Dressing Details (indicate cue type and reason): donning pants, supervision for standing balance Toilet Transfer: Supervision/safety;Min guard;Ambulation Toilet Transfer Details (indicate cue type and reason): simulated via transfer to/from EOB Toileting- Clothing Manipulation and Hygiene: Min guard;Sit to/from Nurse, children's Details (indicate cue type and reason): recommend pt use tub bench initially for bathing task to increase safety and adherence to back precautions during task completion; reviewed safe transfer techniques via tub bench for  transfer into/out of shower Functional mobility during ADLs: Min guard;Supervision/safety;Cane General ADL Comments: pt requiring intermittent cues for adhering to/carryover of back precautions  Vision         Perception     Praxis       Pertinent Vitals/Pain Pain Assessment: Faces Faces Pain Scale: Hurts little more Pain Location: back radiating into RLE Pain Descriptors / Indicators: Discomfort;Grimacing Pain Intervention(s): Limited activity within patient's tolerance;Monitored during session;Repositioned     Hand Dominance     Extremity/Trunk Assessment Upper Extremity Assessment Upper Extremity Assessment: Overall WFL for tasks assessed   Lower Extremity Assessment Lower Extremity Assessment: Defer to PT evaluation;Generalized weakness   Cervical / Trunk Assessment Cervical / Trunk Assessment: Other exceptions Cervical / Trunk Exceptions: s/p spinal sx   Communication Communication Communication: No difficulties   Cognition Arousal/Alertness: Awake/alert Behavior During Therapy: WFL for tasks assessed/performed Overall Cognitive Status: Within Functional Limits for tasks assessed                                 General Comments: pt easily distracted and does require redirection to task at hand   General Comments       Exercises     Shoulder Instructions      Home Living Family/patient expects to be discharged to:: Private residence Living Arrangements: Children(Son) Available Help at Discharge: Family;Personal care attendant(RN 7 days/wk) Type of Home: Apartment Home Access: Stairs to enter [employer] of Steps: flight Entrance Stairs-Rails: Right;Left;Can reach both Home Layout: One level     Bathroom Shower/Tub: Teacher, early years/pre: Standard     Home Equipment: Environmental consultant - 2 wheels;Cane - single point;Tub bench          Prior Functioning/Environment Level of Independence: Needs assistance  Gait / Transfers Assistance Needed: use of SPC for mobility ADL's / Homemaking Assistance Needed: reports has an RN 7 days/wk for a couple hrs who assists with iADL needs, intermittently assists with ADL            OT Problem List: Decreased  strength;Decreased activity tolerance;Impaired balance (sitting and/or standing);Decreased safety awareness;Decreased knowledge of use of DME or AE;Decreased knowledge of precautions;Pain      OT Treatment/Interventions: Self-care/ADL training;Therapeutic exercise;DME and/or AE instruction;Therapeutic activities;Patient/family education;Balance training    OT Goals(Current goals can be found in the care plan section) Acute Rehab OT Goals Patient Stated Goal: home today OT Goal Formulation: With patient Time For Goal Achievement: 03/21/19 Potential to Achieve Goals: Good  OT Frequency: Min 2X/week   Barriers to D/C:            Co-evaluation              AM-PAC OT "6 Clicks" Daily Activity     Outcome Measure Help from another person eating meals?: None Help from another person taking care of personal grooming?: None Help from another person toileting, which includes using toliet, bedpan, or urinal?: None Help from another person bathing (including washing, rinsing, drying)?: A Little Help from another person to put on and taking off regular upper body clothing?: None Help from another person to put on and taking off regular lower body clothing?: A Little 6 Click Score: 22   End of Session Equipment Utilized During Treatment: Gait belt;Other (comment)(cane) Nurse Communication: Mobility status  Activity Tolerance: Patient tolerated treatment well Patient left: with call bell/phone within reach;Other (comment)(seated EOB)  OT Visit Diagnosis: Other abnormalities of gait and mobility (R26.89);Pain Pain - Right/Left: Right Pain - part of body: Leg(and back)  Time: 8177-1165 OT Time Calculation (min): 39 min Charges:  OT General Charges $OT Visit: 1 Visit OT Evaluation $OT Eval Low Complexity: 1 Low OT Treatments $Self Care/Home Management : 8-22 mins  Lou Cal, OT [facility] Pager [phone] Office  [phone]   Raymondo Band 03/07/2019, 8:58 AM

## 2019-03-26 ENCOUNTER — Encounter: Payer: Self-pay | Admitting: Gastroenterology

## 2019-04-05 ENCOUNTER — Other Ambulatory Visit: Payer: Self-pay | Admitting: *Deleted

## 2019-04-05 MED ORDER — REPATHA SURECLICK 140 MG/ML ~~LOC~~ SOAJ: 140.0000 mg | SUBCUTANEOUS | 11 refills | Status: DC

## 2019-04-23 ENCOUNTER — Telehealth: Payer: Self-pay | Admitting: Cardiology

## 2019-04-23 NOTE — Telephone Encounter (Signed)
Patient has been active with COVID for the past eight days and wanted Dr Harl Bowie know. - Has questions about what he needs to do and what is next

## 2019-04-23 NOTE — Telephone Encounter (Signed)
Patient stated that he has tested positive for covid & wanted to let all of his doctors know.  Further questioning revealed that he has been having ongoing SOB with exertion since before getting covid.  Offered VV with Dr. Harl Bowie or extender - stated that he has to get checked again tomorrow & will see what happens.  He will call back if he wants to be seen sooner.

## 2019-05-28 ENCOUNTER — Other Ambulatory Visit: Payer: Self-pay | Admitting: Cardiology

## 2019-05-29 ENCOUNTER — Telehealth: Payer: Self-pay | Admitting: *Deleted

## 2019-05-29 NOTE — Progress Notes (Deleted)
Cardiology Office Note  Date: 05/29/2019   ID: Dalton [name], DOB [birth year], MRN [id number]  PCP:  Nolene Ebbs, MD  Cardiologist:  Carlyle Dolly, MD Electrophysiologist:  None   Chief Complaint: Chest pain  History of Present Illness: Dalton [name] is a 59 y.o. male with recent complaints of chest pain over the last month.  History of CAD/CABG (LIMA to LAD), HTN, HLD, back pain (S/p Right L3-4 extraforaminal microdiscectomy 03/07/2019)  Patient had a telemedicine visit with Dr. Harl Bowie on 12/07/2018.  He had reported at that time some chest pain 5-6 episodes over the preceding 2 to 3 months which occurred at rest or with activity described as "strong pain" left upper chest 8 out of 10 in severity.  Patient reported he could get sweaty.  States that pain improved with certain positions.  Could have some shortness of breath, and some nausea, pain last 10 to 15 minutes but better with nitro at times.  Patient was unable to take antianginal medication such as Imdur and Ranexa.  A recent stress test had shown mild periinfarct ischemia low risk.   I had a follow-up telemedicine visit with the patient on February 07, 2019.  He had reported he had to take sublingual nitroglycerin a couple times the previous week secondary to chest pain.  He also reported back pain.  His amlodipine was increased to 5 mg due to continually elevated blood pressures and complaints of chest pain.  Last diagnostic study was a stress test  November 01, 2018 considered a low risk study with no ST segment deviation noted during stress, findings consistent with small prior mid to distal anterior myocardial infarction with mild peri-infarct ischemia.  EF was 55 to 65%.  Patient had a left heart cath in 2013 with left main normal, LAD normal, RCA tortuous but otherwise normal.  He had a patent left subclavian artery stent with atretic LIMA.  EF was 55 to 60% at that time.   Since last seen he has had a right  extraforaminal L3-L4 micro discectomy on 03/07/2019.  He had also tested positive for Covid virus.  Phone call 05/29/2019 to nursing staff patient reported chest pain 8/10 for the last month with shortness of breath after walking long distance.  He stated he had to rest after a block or 2.  He reported use of nitroglycerin 1 time about 3 to 4 weeks ago.  States that chest pain improved.  Reported dizziness which occurred over 1-2 times.  He denied any active chest pain during the phone call.   Today?   Past Medical History:  Diagnosis Date  . Anemia   . Anxiety   . Asthma   . Bipolar disorder ([place])   . CHF (congestive heart failure) ([place])   . Chronic back pain    Pain Clinic in [place]  . Chronic bronchitis   . Chronic lower back pain   . Congestive heart failure (CHF) ([place])   . COPD (chronic obstructive pulmonary disease) ([place])    per patient 03/05/19  . Coronary artery disease   . Coughing   . Depression   . DVT (deep venous thrombosis) ([place]) ~ 2005   LLE  . Frequency of urination   . GERD (gastroesophageal reflux disease)   . 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 WQVLDKCC(619.0)    "a few times/week" (12/24/2015)  . High cholesterol   . HNP (herniated nucleus pulposus),  cervical   . Hypertension   . Laceration of right hand 11/27/2010  . Laceration of wrist 2007 BIL FOREARMS  . MI (myocardial infarction) ([place])    7, last one was in 2011 (12/24/2015)  . Neuromuscular disorder ([facility])    legs/feet  . Neuropathy    legs/feet  . NSAID-induced gastric ulcer    "Ibuprofen"  . PUD (peptic ulcer disease)    in 1990s, secondary to medication  . Rheumatoid arthritis ([place])    "all over" (12/24/2015)  . Shortness of breath    with exertion  . Tonsillitis, chronic    Dr. Vicki Mallet in [place]  . Type II diabetes mellitus ([place])   . Wears glasses     Past Surgical History:  Procedure Laterality Date  . ANTERIOR  CERVICAL DECOMP/DISCECTOMY FUSION N/A 11/05/2016   Procedure: Anterior Cervical Discectomy and Fusion - Cervical three-Cervical four;  Surgeon: Earnie Larsson, MD;  Location: [facility];  Service: Neurosurgery;  Laterality: N/A;  . BACK SURGERY     x3  . BIOPSY N/A 05/30/2012   Procedure: BIOPSY;  Surgeon: Danie Binder, MD;  Location: [facility];  Service: Endoscopy;  Laterality: N/A;  . BIOPSY  03/02/2016   Procedure: BIOPSY;  Surgeon: Danie Binder, MD;  [facility];  Service: Endoscopy;;  gastric  . CARDIAC CATHETERIZATION  "several"  . CARPAL TUNNEL RELEASE Bilateral   . CATARACT EXTRACTION W/PHACO Right 06/15/2016   Procedure: CATARACT EXTRACTION PHACO AND INTRAOCULAR LENS PLACEMENT (IOC);  Surgeon: Rutherford Guys, MD;  Location: [facility];  Service: Ophthalmology;  Laterality: Right;  CDE: 4.64  . CATARACT EXTRACTION W/PHACO Left 07/13/2016   Procedure: CATARACT EXTRACTION PHACO AND INTRAOCULAR LENS PLACEMENT (IOC);  Surgeon: Rutherford Guys, MD;  Location: [facility];  Service: Ophthalmology;  Laterality: Left;  CDE: 3.15  . COLONOSCOPY  12/28/2010   SLF: (MAC)Internal hemorrhoids/four small colon polyps tubular adenomas. per SLF: colonoscopy 2022  . COLONOSCOPY WITH PROPOFOL N/A 07/05/2017   Procedure: COLONOSCOPY WITH PROPOFOL;  Surgeon: Danie Binder, MD;  [facility];  Service: Endoscopy;  Laterality: N/A;  7:30am  . CORONARY ARTERY BYPASS GRAFT  2002   3 vessels  . ESOPHAGOGASTRODUODENOSCOPY N/A 05/30/2012   SLF: UNCONTROLLED GERD DUE TO LIFESTYLE CHOICE/WEIGHT GAIN/MILD Non-erosive gastritis  . ESOPHAGOGASTRODUODENOSCOPY (EGD) WITH PROPOFOL N/A 03/02/2016   Dr. Oneida Alar: Esophagus appeared normal, impaired dilation performed, patchy inflammation with edema and erythema of the entire stomach., Biopsy with H pylori, patient completed Pylera.   [name] Kitchen FRACTURE SURGERY    . LEFT HEART CATHETERIZATION WITH CORONARY ANGIOGRAM N/A 08/31/2011   Procedure: LEFT HEART CATHETERIZATION WITH CORONARY  ANGIOGRAM;  Surgeon: Laverda Page, MD;  Location: [facility] CATH LAB;  Service: Cardiovascular;  Laterality: N/A;  . LUMBAR DISC SURGERY     "L4-5; Dr. Trenton Gammon"  . LUMBAR LAMINECTOMY/DECOMPRESSION MICRODISCECTOMY Right 03/06/2019   Procedure: MICRODISCECTOMY EXTRAFORAMINAL LUMBAR THREE - LUMBAR FOUR RIGHT;  Surgeon: Earnie Larsson, MD;  Location: [facility];  Service: Neurosurgery;  Laterality: Right;  MICRODISCECTOMY EXTRAFORAMINAL LUMBAR THREE - LUMBAR FOUR RIGHT  . MULTIPLE TOOTH EXTRACTIONS    . ORIF MANDIBULAR FRACTURE N/A 12/19/2015   Procedure: OPEN REDUCTION INTERNAL FIXATION (ORIF) MANDIBULAR FRACTURE;  Surgeon: Izora Gala, MD;  Location: [facility];  Service: ENT;  Laterality: N/A;  . PATELLA FRACTURE SURGERY Left 1976   plate to knee cap from accident  . POLYPECTOMY  07/05/2017   Procedure: POLYPECTOMY;  Surgeon: Danie Binder, MD;  [facility];  Service: Endoscopy;;  colon  . SAVORY DILATION  12/28/2010   SLF:(MAC)J-shaped stomach/nodular mocosa in the distal esophagus/empiric dilation 36m  . SAVORY DILATION N/A 03/02/2016   Procedure: SAVORY DILATION;  Surgeon: SDanie Binder MD;  [facility];  Service: Endoscopy;  Laterality: N/A;    Current Outpatient Medications  Medication Sig Dispense Refill  . albuterol (PROVENTIL HFA;VENTOLIN HFA) 108 (90 Base) MCG/ACT inhaler Inhale 2 puffs into the lungs every 6 (six) hours as needed for wheezing or shortness of breath.     .[name] Kitchenalbuterol (PROVENTIL) (2.5 MG/3ML) 0.083% nebulizer solution Take 2.5 mg by nebulization 2 (two) times daily.    .[name] Kitchenalprazolam (XANAX) 2 MG tablet Take 2 mg by mouth 2 (two) times daily.    .[name] KitchenamLODipine (NORVASC) 5 MG tablet Take 1 tablet (5 mg total) by mouth daily. 90 tablet 1  . aspirin-sod bicarb-citric acid (ALKA-SELTZER) 325 MG TBEF tablet Take 650 mg by mouth every 6 (six) hours as needed (immune support).    .[name] Kitchenatorvastatin (LIPITOR) 80 MG tablet Take 1 tablet (80 mg total) by mouth daily. (Patient  taking differently: Take 80 mg by mouth every evening. ) 90 tablet 3  . beclomethasone (QVAR) 80 MCG/ACT inhaler Inhale 2 puffs into the lungs 2 (two) times daily as needed (respiratory issues.).     .[name] Kitchencetirizine (ZYRTEC) 10 MG tablet Take 10 mg by mouth daily as needed for allergies.   0  . clopidogrel (PLAVIX) 75 MG tablet Take 75 mg by mouth daily.    .[name] Kitchendexlansoprazole (DEXILANT) 60 MG capsule Take 1 capsule (60 mg total) by mouth daily. 30 capsule 5  . Evolocumab (REPATHA SURECLICK) 1962MG/ML SOAJ Inject 140 mg into the skin every 14 (fourteen) days. 2 pen 11  . ezetimibe (ZETIA) 10 MG tablet Take 10 mg by mouth daily.    . fluticasone (FLONASE) 50 MCG/ACT nasal spray Place 2 sprays into both nostrils daily.     . Fluticasone Furoate (ARNUITY ELLIPTA) 200 MCG/ACT AEPB Inhale 1 puff into the lungs daily.    . furosemide (LASIX) 40 MG tablet TAKE 1 TABLET(40 MG) BY MOUTH DAILY 90 tablet 1  . glipiZIDE (GLUCOTROL) 10 MG tablet Take 10 mg by mouth daily before breakfast.    . hydrochlorothiazide (HYDRODIURIL) 25 MG tablet Take 25 mg by mouth daily.    .[name] Kitchenipratropium (ATROVENT) 0.02 % nebulizer solution Take 0.5 mg by nebulization 2 (two) times daily as needed (for congestion).    .[name] KitchenLINZESS 290 MCG CAPS capsule TAKE 1 CAPSULE(290 MCG) BY MOUTH DAILY BEFORE BREAKFAST (Patient not taking: No sig reported) 30 capsule 5  . lisinopril (PRINIVIL,ZESTRIL) 10 MG tablet Take 10 mg by mouth daily.    . Liver Extract (LIVER PO) Take 1 tablet by mouth daily. "Liver Aid"    . magnesium citrate SOLN Take 1 Bottle by mouth as needed for severe constipation.    . metFORMIN (GLUCOPHAGE) 500 MG tablet Take 1 tablet (500 mg total) by mouth daily with breakfast. (Patient not taking: Reported on 03/01/2019) 30 tablet 0  . metFORMIN (GLUCOPHAGE-XR) 500 MG 24 hr tablet Take 1,000 mg by mouth daily with breakfast.    . metoprolol tartrate (LOPRESSOR) 25 MG tablet Take 0.5 tablets (12.5 mg total) by mouth 2 (two) times daily.  30 tablet 0  . Multiple Vitamin (MULTIVITAMIN WITH MINERALS) TABS tablet Take 1 tablet by mouth daily.    . Multiple Vitamins-Minerals (EMERGEN-C IMMUNE) PACK Take 1 packet by mouth daily.    .[name] Kitchen  naloxegol oxalate (MOVANTIK) 25 MG TABS tablet Take 1 tablet (25 mg total) by mouth daily. 30 tablet 3  . NARCAN 4 MG/0.1ML LIQD nasal spray kit Place 1 spray into the nose as needed (opioid overdose).   0  . nitroGLYCERIN (NITROSTAT) 0.4 MG SL tablet SEE NOTES (Patient taking differently: Place 0.4 mg under the tongue every 5 (five) minutes as needed for chest pain. ) 100 tablet 3  . oxyCODONE-acetaminophen (PERCOCET) 10-325 MG tablet Take 1 tablet by mouth every 4 (four) hours as needed for pain.    . potassium chloride (K-DUR,KLOR-CON) 10 MEQ tablet Take 1 tablet (10 mEq total) by mouth daily. (Patient not taking: Reported on 03/01/2019) 30 tablet 0  . potassium chloride SA (KLOR-CON) 20 MEQ tablet Take 20 mEq by mouth 2 (two) times daily.    . Pseudoeph-Doxylamine-DM-APAP (NYQUIL PO) Take 1 Dose by mouth at bedtime as needed (cough).    . Pseudoephedrine-APAP-DM (DAYQUIL PO) Take 1 Dose by mouth daily as needed (cough).    . ranolazine (RANEXA) 500 MG 12 hr tablet Take 500 mg by mouth 2 (two) times daily.    . sertraline (ZOLOFT) 100 MG tablet Take 100 mg by mouth daily.    . sitaGLIPtin (JANUVIA) 100 MG tablet Take 100 mg by mouth daily.    . tamsulosin (FLOMAX) 0.4 MG CAPS capsule Take 1 capsule (0.4 mg total) by mouth daily after breakfast. 30 capsule 0  . Vitamin D, Ergocalciferol, (DRISDOL) 1.25 MG (50000 UT) CAPS capsule Take 50,000 Units by mouth every 7 (seven) days.     No current facility-administered medications for this visit.   Allergies:  Gabapentin, Ibuprofen, Zolpidem tartrate, and Naproxen   Social History: The patient  reports that he has never smoked. He has never used smokeless tobacco. He reports previous drug use. Drugs: "Crack" cocaine, Cocaine, and Marijuana. He reports that he  does not drink alcohol.   Family History: The patient's family history includes Diabetes in his father and mother; Heart attack in his brother, sister, and another family member; Heart attack (age of onset: 4) in his father; Heart attack (age of onset: 21) in his mother; Heart failure in an other family member; Hypertension in his father and mother; Seizures in his brother.   ROS:  Please see the history of present illness. Otherwise, complete review of systems is positive for {NONE DEFAULTED:18576::"none"}.  All other systems are reviewed and negative.   Physical Exam: VS:  There were no vitals taken for this visit., BMI There is no height or weight on file to calculate BMI.  Wt Readings from Last 3 Encounters:  03/06/19 267 lb (121.1 kg)  02/08/19 277 lb 9.6 oz (125.9 kg)  02/07/19 260 lb (117.9 kg)    General: Patient appears comfortable at rest. HEENT: Conjunctiva and lids normal, oropharynx clear with moist mucosa. Neck: Supple, no elevated JVP or carotid bruits, no thyromegaly. Lungs: Clear to auscultation, nonlabored breathing at rest. Cardiac: Regular rate and rhythm, no S3 or significant systolic murmur, no pericardial rub. Abdomen: Soft, nontender, no hepatomegaly, bowel sounds present, no guarding or rebound. Extremities: No pitting edema, distal pulses 2+. Skin: Warm and dry. Musculoskeletal: No kyphosis. Neuropsychiatric: Alert and oriented x3, affect grossly appropriate.  ECG:  {EKG/Telemetry Strips Reviewed:[phone]}  Recent Labwork: 03/06/2019: BUN 9; Creatinine, Ser 0.96; Hemoglobin 13.3; Platelets 218; Potassium 4.1; Sodium 139     Component Value Date/Time   CHOL 123 01/11/2019 1120   TRIG 306 (H) 01/11/2019 1120   HDL  36 (L) 01/11/2019 1120   CHOLHDL 3.4 01/11/2019 1120   CHOLHDL 2.0 12/26/2017 1112   VLDL 69 (H) 12/16/2015 0630   LDLCALC 41 01/11/2019 1120   LDLCALC 10 12/26/2017 1112    Other Studies Reviewed Today:  06/2017 echo Study  Conclusions  - Left ventricle: The cavity size was normal. Wall thickness was increased in a pattern of mild LVH. Systolic function was normal. The estimated ejection fraction was in the range of 60% to 65%.  Nuclear stress test November 01, 2018  There was no ST segment deviation noted during stress.  Findings consistent with small prior mid to distal anterior myocardial infarction with mild peri-infarct ischemia.  This is a low risk study. The left ventricular ejection fraction is normal (55-65%).  LHC (08/31/11, Dr. Einar Gip): LMCA normal. LAD normal. LAD normal. RCA tortuous but otherwise normal. LVEF 55-60%. LVEDP 19 mmHg. Patent left subclavian artery stent with atretic LIMA.  LHC (10/24/08): LMCA normal. LAD with midvessel bridge and 40% stenosis in systole. Ostial LAD with 30% stenosis. LCx with mild luminal irregularities. RCA with mild luminal irregularities. Patent left subclavian artery stent. LIMA -> LAD proximally occluded.  Assessment and Plan:  1. Chest pain, unspecified type   2. CAD in native artery   3. Essential hypertension   4. Hyperlipidemia, unspecified hyperlipidemia type    1. Chest pain, unspecified type ***  2. CAD in native artery ***  3. Essential hypertension ***  4. Hyperlipidemia, unspecified hyperlipidemia type ***  Medication Adjustments/Labs and Tests Ordered: Current medicines are reviewed at length with the patient today.  Concerns regarding medicines are outlined above.   Disposition: Follow-up with Dr. Harl Bowie or APP  Signed, Levell July, NP 05/29/2019 4:55 PM    [facility] [place], [place], [place] [address] Phone: [phone]; Fax: [phone]

## 2019-05-29 NOTE — Telephone Encounter (Signed)
Agree with plan  J Damyan Corne MD 

## 2019-05-29 NOTE — Telephone Encounter (Signed)
Patient called the office requesting an appointment. Reports for the past month he's had chest pain rated 8/10 and SOB after walking a long distance. Says he has to stop and rest after a block or two. Reports using nitroglycerin one time about 3-4 weeks ago. Reports chest pain improved. Reports dizziness occurred 1-2 times. Denies active chest pain. Office visit scheduled with Jonni Sanger NP on 05/31/19 @2 :00 pm. Advised if symptoms get worse, that he needed to go to the ED for an evaluation. Verbalized understanding of plan.

## 2019-05-31 ENCOUNTER — Ambulatory Visit: Payer: Medicare Other | Admitting: Family Medicine

## 2019-06-04 NOTE — Progress Notes (Deleted)
Cardiology Office Note  Date: 06/04/2019   ID: [name], DOB 03-06-1960, MRN [id number]  PCP:  Nolene Ebbs, MD  Cardiologist:  Carlyle Dolly, MD Electrophysiologist:  None   Chief Complaint: F/U CAD, CP, HTN, HLD, Back pain, HF  History of Present Illness: Dalton [name] is a 59 y.o. male with a history of   Past Medical History:  Diagnosis Date  . Anemia   . Anxiety   . Asthma   . Bipolar disorder ([place])   . CHF (congestive heart failure) ([place])   . Chronic back pain    [facility] in [place]  . Chronic bronchitis   . Chronic lower back pain   . Congestive heart failure (CHF) ([place])   . COPD (chronic obstructive pulmonary disease) ([place])    per patient 03/05/19  . Coronary artery disease   . Coughing   . Depression   . DVT (deep venous thrombosis) ([place]) ~ 2005   LLE  . Frequency of urination   . GERD (gastroesophageal reflux disease)   . 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 MBEMLJQG(920.1)    "a few times/week" (12/24/2015)  . High cholesterol   . HNP (herniated nucleus pulposus), cervical   . Hypertension   . Laceration of right hand 11/27/2010  . Laceration of wrist 2007 BIL FOREARMS  . MI (myocardial infarction) ([place])    7, last one was in 2011 (12/24/2015)  . Neuromuscular disorder (H[facility])    legs/feet  . Neuropathy    legs/feet  . NSAID-induced gastric ulcer    "Ibuprofen"  . PUD (peptic ulcer disease)    in 1990s, secondary to medication  . Rheumatoid arthritis ([place])    "all over" (12/24/2015)  . Shortness of breath    with exertion  . Tonsillitis, chronic    Dr. Vicki Mallet [place]  . Type II diabetes mellitus ([place])   . Wears glasses     Past Surgical History:  Procedure Laterality Date  . ANTERIOR CERVICAL DECOMP/DISCECTOMY FUSION N/A 11/05/2016   Procedure: Anterior Cervical Discectomy and Fusion - Cervical three-Cervical four;  Surgeon: Earnie Larsson, MD;   Location: [place];  Service: Neurosurgery;  Laterality: N/A;  . BACK SURGERY     x3  . BIOPSY N/A 05/30/2012   Procedure: BIOPSY;  Surgeon: Danie Binder, MD;  Location[facility];  Service: Endoscopy;  Laterality: N/A;  . BIOPSY  03/02/2016   Procedure: BIOPSY;  Surgeon: Danie Binder, MD;  [facility];  Service: Endoscopy;;  gastric  . CARDIAC CATHETERIZATION  "several"  . CARPAL TUNNEL RELEASE Bilateral   . CATARACT EXTRACTION W/PHACO Right 06/15/2016   Procedure: CATARACT EXTRACTION PHACO AND INTRAOCULAR LENS PLACEMENT (IOC);  Surgeon: Rutherford Guys, MD;  Location: [facility];  Service: Ophthalmology;  Laterality: Right;  CDE: 4.64  . CATARACT EXTRACTION W/PHACO Left 07/13/2016   Procedure: CATARACT EXTRACTION PHACO AND INTRAOCULAR LENS PLACEMENT (IOC);  Surgeon: Rutherford Guys, MD;  Location: [facility];  Service: Ophthalmology;  Laterality: Left;  CDE: 3.15  . COLONOSCOPY  12/28/2010   SLF: (MAC)Internal hemorrhoids/four small colon polyps tubular adenomas. per SLF: colonoscopy 2022  . COLONOSCOPY WITH PROPOFOL N/A 07/05/2017   Procedure: COLONOSCOPY WITH PROPOFOL;  Surgeon: Danie Binder, MD;  [facility];  Service: Endoscopy;  Laterality: N/A;  7:30am  . CORONARY ARTERY BYPASS GRAFT  2002   3 vessels  . ESOPHAGOGASTRODUODENOSCOPY N/A 05/30/2012  SLF: UNCONTROLLED GERD DUE TO LIFESTYLE CHOICE/WEIGHT GAIN/MILD Non-erosive gastritis  . ESOPHAGOGASTRODUODENOSCOPY (EGD) WITH PROPOFOL N/A 03/02/2016   Dr. Oneida Alar: Esophagus appeared normal, impaired dilation performed, patchy inflammation with edema and erythema of the entire stomach., Biopsy with H pylori, patient completed Pylera.   [name] Kitchen FRACTURE SURGERY    . LEFT HEART CATHETERIZATION WITH CORONARY ANGIOGRAM N/A 08/31/2011   Procedure: LEFT HEART CATHETERIZATION WITH CORONARY ANGIOGRAM;  Surgeon: Laverda Page, MD;  Location: [facility] CATH LAB;  Service: Cardiovascular;  Laterality: N/A;  . LUMBAR DISC SURGERY     "L4-5; Dr. Trenton Gammon"  .  LUMBAR LAMINECTOMY/DECOMPRESSION MICRODISCECTOMY Right 03/06/2019   Procedure: MICRODISCECTOMY EXTRAFORAMINAL LUMBAR THREE - LUMBAR FOUR RIGHT;  Surgeon: Earnie Larsson, MD;  Location: [place];  Service: Neurosurgery;  Laterality: Right;  MICRODISCECTOMY EXTRAFORAMINAL LUMBAR THREE - LUMBAR FOUR RIGHT  . MULTIPLE TOOTH EXTRACTIONS    . ORIF MANDIBULAR FRACTURE N/A 12/19/2015   Procedure: OPEN REDUCTION INTERNAL FIXATION (ORIF) MANDIBULAR FRACTURE;  Surgeon: Izora Gala, MD;  Location: [place];  Service: ENT;  Laterality: N/A;  . PATELLA FRACTURE SURGERY Left 1976   plate to knee cap from accident  . POLYPECTOMY  07/05/2017   Procedure: POLYPECTOMY;  Surgeon: Danie Binder, MD;  [facility];  Service: Endoscopy;;  colon  . SAVORY DILATION  12/28/2010   SLF:(MAC)J-shaped stomach/nodular mocosa in the distal esophagus/empiric dilation 64m  . SAVORY DILATION N/A 03/02/2016   Procedure: SAVORY DILATION;  Surgeon: SDanie Binder MD;  [facility];  Service: Endoscopy;  Laterality: N/A;    Current Outpatient Medications  Medication Sig Dispense Refill  . albuterol (PROVENTIL HFA;VENTOLIN HFA) 108 (90 Base) MCG/ACT inhaler Inhale 2 puffs into the lungs every 6 (six) hours as needed for wheezing or shortness of breath.     .[name] Kitchenalbuterol (PROVENTIL) (2.5 MG/3ML) 0.083% nebulizer solution Take 2.5 mg by nebulization 2 (two) times daily.    .[name] Kitchenalprazolam (XANAX) 2 MG tablet Take 2 mg by mouth 2 (two) times daily.    .[name] KitchenamLODipine (NORVASC) 5 MG tablet Take 1 tablet (5 mg total) by mouth daily. 90 tablet 1  . aspirin-sod bicarb-citric acid (ALKA-SELTZER) 325 MG TBEF tablet Take 650 mg by mouth every 6 (six) hours as needed (immune support).    .[name] Kitchenatorvastatin (LIPITOR) 80 MG tablet Take 1 tablet (80 mg total) by mouth daily. (Patient taking differently: Take 80 mg by mouth every evening. ) 90 tablet 3  . beclomethasone (QVAR) 80 MCG/ACT inhaler Inhale 2 puffs into the lungs 2 (two) times daily as  needed (respiratory issues.).     .[name] Kitchencetirizine (ZYRTEC) 10 MG tablet Take 10 mg by mouth daily as needed for allergies.   0  . clopidogrel (PLAVIX) 75 MG tablet Take 75 mg by mouth daily.    .[name] Kitchendexlansoprazole (DEXILANT) 60 MG capsule Take 1 capsule (60 mg total) by mouth daily. 30 capsule 5  . Evolocumab (REPATHA SURECLICK) 1194MG/ML SOAJ Inject 140 mg into the skin every 14 (fourteen) days. 2 pen 11  . ezetimibe (ZETIA) 10 MG tablet Take 10 mg by mouth daily.    . fluticasone (FLONASE) 50 MCG/ACT nasal spray Place 2 sprays into both nostrils daily.     . Fluticasone Furoate (ARNUITY ELLIPTA) 200 MCG/ACT AEPB Inhale 1 puff into the lungs daily.    . furosemide (LASIX) 40 MG tablet TAKE 1 TABLET(40 MG) BY MOUTH DAILY 90 tablet 1  . glipiZIDE (GLUCOTROL) 10 MG tablet Take 10 mg  by mouth daily before breakfast.    . hydrochlorothiazide (HYDRODIURIL) 25 MG tablet Take 25 mg by mouth daily.    [name] Kitchen ipratropium (ATROVENT) 0.02 % nebulizer solution Take 0.5 mg by nebulization 2 (two) times daily as needed (for congestion).    [name] Kitchen LINZESS 290 MCG CAPS capsule TAKE 1 CAPSULE(290 MCG) BY MOUTH DAILY BEFORE BREAKFAST (Patient not taking: No sig reported) 30 capsule 5  . lisinopril (PRINIVIL,ZESTRIL) 10 MG tablet Take 10 mg by mouth daily.    . Liver Extract (LIVER PO) Take 1 tablet by mouth daily. "Liver Aid"    . magnesium citrate SOLN Take 1 Bottle by mouth as needed for severe constipation.    . metFORMIN (GLUCOPHAGE) 500 MG tablet Take 1 tablet (500 mg total) by mouth daily with breakfast. (Patient not taking: Reported on 03/01/2019) 30 tablet 0  . metFORMIN (GLUCOPHAGE-XR) 500 MG 24 hr tablet Take 1,000 mg by mouth daily with breakfast.    . metoprolol tartrate (LOPRESSOR) 25 MG tablet Take 0.5 tablets (12.5 mg total) by mouth 2 (two) times daily. 30 tablet 0  . Multiple Vitamin (MULTIVITAMIN WITH MINERALS) TABS tablet Take 1 tablet by mouth daily.    . Multiple Vitamins-Minerals (EMERGEN-C IMMUNE) PACK Take  1 packet by mouth daily.    . naloxegol oxalate (MOVANTIK) 25 MG TABS tablet Take 1 tablet (25 mg total) by mouth daily. 30 tablet 3  . NARCAN 4 MG/0.1ML LIQD nasal spray kit Place 1 spray into the nose as needed (opioid overdose).   0  . nitroGLYCERIN (NITROSTAT) 0.4 MG SL tablet SEE NOTES (Patient taking differently: Place 0.4 mg under the tongue every 5 (five) minutes as needed for chest pain. ) 100 tablet 3  . oxyCODONE-acetaminophen (PERCOCET) 10-325 MG tablet Take 1 tablet by mouth every 4 (four) hours as needed for pain.    . potassium chloride (K-DUR,KLOR-CON) 10 MEQ tablet Take 1 tablet (10 mEq total) by mouth daily. (Patient not taking: Reported on 03/01/2019) 30 tablet 0  . potassium chloride SA (KLOR-CON) 20 MEQ tablet Take 20 mEq by mouth 2 (two) times daily.    . Pseudoeph-Doxylamine-DM-APAP (NYQUIL PO) Take 1 Dose by mouth at bedtime as needed (cough).    . Pseudoephedrine-APAP-DM (DAYQUIL PO) Take 1 Dose by mouth daily as needed (cough).    . ranolazine (RANEXA) 500 MG 12 hr tablet Take 500 mg by mouth 2 (two) times daily.    . sertraline (ZOLOFT) 100 MG tablet Take 100 mg by mouth daily.    . sitaGLIPtin (JANUVIA) 100 MG tablet Take 100 mg by mouth daily.    . tamsulosin (FLOMAX) 0.4 MG CAPS capsule Take 1 capsule (0.4 mg total) by mouth daily after breakfast. 30 capsule 0  . Vitamin D, Ergocalciferol, (DRISDOL) 1.25 MG (50000 UT) CAPS capsule Take 50,000 Units by mouth every 7 (seven) days.     No current facility-administered medications for this visit.   Allergies:  Gabapentin, Ibuprofen, Zolpidem tartrate, and Naproxen   Social History: The patient  reports that he has never smoked. He has never used smokeless tobacco. He reports previous drug use. Drugs: "Crack" cocaine, Cocaine, and Marijuana. He reports that he does not drink alcohol.   Family History: The patient's family history includes Diabetes in his father and mother; Heart attack in his brother, sister, and another  family member; Heart attack (age of onset: 74) in his father; Heart attack (age of onset: 51) in his mother; Heart failure in an other family member; Hypertension  in his father and mother; Seizures in his brother.   ROS:  Please see the history of present illness. Otherwise, complete review of systems is positive for {NONE DEFAULTED:18576::"none"}.  All other systems are reviewed and negative.   Physical Exam: VS:  There were no vitals taken for this visit., BMI There is no height or weight on file to calculate BMI.  Wt Readings from Last 3 Encounters:  03/06/19 267 lb (121.1 kg)  02/08/19 277 lb 9.6 oz (125.9 kg)  02/07/19 260 lb (117.9 kg)    General: Patient appears comfortable at rest. HEENT: Conjunctiva and lids normal, oropharynx clear with moist mucosa. Neck: Supple, no elevated JVP or carotid bruits, no thyromegaly. Lungs: Clear to auscultation, nonlabored breathing at rest. Cardiac: Regular rate and rhythm, no S3 or significant systolic murmur, no pericardial rub. Abdomen: Soft, nontender, no hepatomegaly, bowel sounds present, no guarding or rebound. Extremities: No pitting edema, distal pulses 2+. Skin: Warm and dry. Musculoskeletal: No kyphosis. Neuropsychiatric: Alert and oriented x3, affect grossly appropriate.  ECG:  {EKG/Telemetry Strips Reviewed:[phone]}  Recent Labwork: 03/06/2019: BUN 9; Creatinine, Ser 0.96; Hemoglobin 13.3; Platelets 218; Potassium 4.1; Sodium 139     Component Value Date/Time   CHOL 123 01/11/2019 1120   TRIG 306 (H) 01/11/2019 1120   HDL 36 (L) 01/11/2019 1120   CHOLHDL 3.4 01/11/2019 1120   CHOLHDL 2.0 12/26/2017 1112   VLDL 69 (H) 12/16/2015 0630   LDLCALC 41 01/11/2019 1120   LDLCALC 10 12/26/2017 1112    Other Studies Reviewed Today:  06/2017 echo Study Conclusions  - Left ventricle: The cavity size was normal. Wall thickness was increased in a pattern of mild LVH. Systolic function was normal. The estimated ejection  fraction was in the range of 60% to 65%.  Nuclear stress test November 01, 2018  There was no ST segment deviation noted during stress.  Findings consistent with small prior mid to distal anterior myocardial infarction with mild peri-infarct ischemia.  This is a low risk study. The left ventricular ejection fraction is normal (55-65%).  LHC (08/31/11, Dr. Einar Gip): LMCA normal. LAD normal. LAD normal. RCA tortuous but otherwise normal. LVEF 55-60%. LVEDP 19 mmHg. Patent left subclavian artery stent with atretic LIMA.  LHC (10/24/08): LMCA normal. LAD with midvessel bridge and 40% stenosis in systole. Ostial LAD with 30% stenosis. LCx with mild luminal irregularities. RCA with mild luminal irregularities. Patent left subclavian artery stent. LIMA -> LAD proximally occluded  Assessment and Plan:  1. CAD in native artery   2. Essential hypertension   3. Mixed hyperlipidemia   4. Chronic heart failure with preserved ejection fraction ([place])    1. CAD in native artery Recent low risk nuclear stress 11/01/2019. Patient used NTG SL recently. Continues to c/o chest pain  2. Essential hypertension Amlodipine increased to 5 mg po daily at last visit  3. Mixed hyperlipidemia Recent lipid levels January 11, 2019 showed total cholesterol 123, triglycerides 306, HDL 36, LDL 41.  4. Chronic heart failure with preserved ejection fraction (H[facility]) Last echo in May 2019 showed mild LVH with an ejection fraction of 60 to 65%.    Medication Adjustments/Labs and Tests Ordered: Current medicines are reviewed at length with the patient today.  Concerns regarding medicines are outlined above.   Disposition: Follow-up with   Signed, Levell July, NP 06/04/2019 10:15 PM    [facility] [place], [place], [place] [address] Phone: [phone]; Fax: [phone]

## 2019-06-05 ENCOUNTER — Ambulatory Visit: Payer: Medicare Other | Admitting: Family Medicine

## 2019-06-05 ENCOUNTER — Telehealth: Payer: Self-pay | Admitting: Cardiology

## 2019-06-05 NOTE — Telephone Encounter (Signed)
New Message  Patient would like to get transfer of care from Dr. Carlyle Dolly to Dr. Daneen Schick.   Please send message regarding approval or disapproval.

## 2019-06-06 NOTE — Telephone Encounter (Signed)
Ok with me  Zandra Abts MD

## 2019-06-10 NOTE — Telephone Encounter (Signed)
He was supposed to see me in 2017 but no showed.  I am okay with whatever he wants, emphasize it may take awhile and he needs to show up.

## 2019-06-18 NOTE — Telephone Encounter (Signed)
LVM informing patient provider switch was approved and to call back to schedule.

## 2019-06-18 NOTE — Progress Notes (Signed)
Cardiology Office Note:    Date:  06/19/2019   ID:  ICARUS PARTCH, DOB 11/09/1960, MRN [id number]  PCP:  Nolene Ebbs, MD  Cardiologist:  Carlyle Dolly, MD   Referring MD: Nolene Ebbs, MD   Chief Complaint  Patient presents with  . Coronary Artery Disease  . Advice Only    Cocaine and opiate use in the past    History of Present Illness:    Dalton [name] is a 59 y.o. male with a hx of coronary artery disease status post single-vessel CABG 2002 (LIMA to LAD; most recent cardiac catheterization showed atretic LIMA with normal native coronary arteries), multiple prior MIs perhaps in the setting of cocaine use, ischemic cardiomyopathy (last EF greater than 50%), HTN, HLD, type 2 diabetes mellitus, left subclavian stent, rheumatoid arthritis, DVT, peptic ulcer disease, bipolar disorder, and cocaine use, who presents to establish for long-term follow-up.  Previous cardiology exposure to Dr. Einar Gip, Dr. Saunders Revel, and Dr. Carlyle Dolly.  There is a remote mention of left subclavian steal per Dr. Einar Gip in a 2013 catheterization note that was not verified by hemodynamics.  Difficult to obtain coherent historical information.  Cannot remember when his bypass operation was performed but it was around 2002.  He feels he had more than 1 vessel bypass when surgery was done.  He denies angina currently.  He says there is swelling and shortness of breath.  He states that he has been told his heart is weak.  Recently saw a Medicaid pharmacist who said he was not taking his medications correctly.  He feels that the medications as they are listed currently is what he takes at home with the exception that the pharmacist told him to discontinue Ranexa.  He is not using nitroglycerin for chest pain.  He is having minimal if any episodes of chest pain.  He states fluid intake is plentiful and at times he drinks plenty of fluid because he is not making enough urine.  States he always has dyspnea on  exertion and orthopnea.  He has previously seen both Dr. end and Dr. Harl Bowie.  Past Medical History:  Diagnosis Date  . Anemia   . Anxiety   . Asthma   . Bipolar disorder ([place])   . CHF (congestive heart failure) ([place])   . Chronic back pain    Pain Clinic in [place]  . Chronic bronchitis   . Chronic lower back pain   . Congestive heart failure (CHF) ([place])   . COPD (chronic obstructive pulmonary disease) ([place])    per patient 03/05/19  . Coronary artery disease   . Coughing   . Depression   . DVT (deep venous thrombosis) ([place]) ~ 2005   LLE  . Frequency of urination   . GERD (gastroesophageal reflux disease)   . 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 IHKVQQVZ(563.8)    "a few times/week" (12/24/2015)  . High cholesterol   . HNP (herniated nucleus pulposus), cervical   . Hypertension   . Laceration of right hand 11/27/2010  . Laceration of wrist 2007 BIL FOREARMS  . MI (myocardial infarction) ([place])    7, last one was in 2011 (12/24/2015)  . Neuromuscular disorder (HCC)    legs/feet  . Neuropathy    legs/feet  . NSAID-induced gastric ulcer    "Ibuprofen"  . PUD (peptic ulcer disease)    in 1990s, secondary to medication  . Rheumatoid arthritis ([place])    "  all over" (12/24/2015)  . Shortness of breath    with exertion  . Tonsillitis, chronic    Dr. Vicki Mallet in [place]  . Type II diabetes mellitus ([place])   . Wears glasses     Past Surgical History:  Procedure Laterality Date  . ANTERIOR CERVICAL DECOMP/DISCECTOMY FUSION N/A 11/05/2016   Procedure: Anterior Cervical Discectomy and Fusion - Cervical three-Cervical four;  Surgeon: Earnie Larsson, MD;  Location: [facility];  Service: Neurosurgery;  Laterality: N/A;  . BACK SURGERY     x3  . BIOPSY N/A 05/30/2012   Procedure: BIOPSY;  Surgeon: Danie Binder, MD;  [facility]: [facility];  Service: Endoscopy;  Laterality: N/A;  . BIOPSY  03/02/2016   Procedure: BIOPSY;  Surgeon:  Danie Binder, MD;  [facility];  Service: Endoscopy;;  gastric  . CARDIAC CATHETERIZATION  "several"  . CARPAL TUNNEL RELEASE Bilateral   . CATARACT EXTRACTION W/PHACO Right 06/15/2016   Procedure: CATARACT EXTRACTION PHACO AND INTRAOCULAR LENS PLACEMENT (IOC);  Surgeon: Rutherford Guys, MD;  Location: [facility];  Service: Ophthalmology;  Laterality: Right;  CDE: 4.64  . CATARACT EXTRACTION W/PHACO Left 07/13/2016   Procedure: CATARACT EXTRACTION PHACO AND INTRAOCULAR LENS PLACEMENT (IOC);  Surgeon: Rutherford Guys, MD;  Location: [facility];  Service: Ophthalmology;  Laterality: Left;  CDE: 3.15  . COLONOSCOPY  12/28/2010   SLF: (MAC)Internal hemorrhoids/four small colon polyps tubular adenomas. per SLF: colonoscopy 2022  . COLONOSCOPY WITH PROPOFOL N/A 07/05/2017   Procedure: COLONOSCOPY WITH PROPOFOL;  Surgeon: Danie Binder, MD;  [facility];  Service: Endoscopy;  Laterality: N/A;  7:30am  . CORONARY ARTERY BYPASS GRAFT  2002   3 vessels  . ESOPHAGOGASTRODUODENOSCOPY N/A 05/30/2012   SLF: UNCONTROLLED GERD DUE TO LIFESTYLE CHOICE/WEIGHT GAIN/MILD Non-erosive gastritis  . ESOPHAGOGASTRODUODENOSCOPY (EGD) WITH PROPOFOL N/A 03/02/2016   Dr. Oneida Alar: Esophagus appeared normal, impaired dilation performed, patchy inflammation with edema and erythema of the entire stomach., Biopsy with H pylori, patient completed Pylera.   [name] Kitchen FRACTURE SURGERY    . LEFT HEART CATHETERIZATION WITH CORONARY ANGIOGRAM N/A 08/31/2011   Procedure: LEFT HEART CATHETERIZATION WITH CORONARY ANGIOGRAM;  Surgeon: Laverda Page, MD;  Location: [facility] CATH LAB;  Service: Cardiovascular;  Laterality: N/A;  . LUMBAR DISC SURGERY     "L4-5; Dr. Trenton Gammon"  . LUMBAR LAMINECTOMY/DECOMPRESSION MICRODISCECTOMY Right 03/06/2019   Procedure: MICRODISCECTOMY EXTRAFORAMINAL LUMBAR THREE - LUMBAR FOUR RIGHT;  Surgeon: Earnie Larsson, MD;  Location: [facility];  Service: Neurosurgery;  Laterality: Right;  MICRODISCECTOMY EXTRAFORAMINAL LUMBAR  THREE - LUMBAR FOUR RIGHT  . MULTIPLE TOOTH EXTRACTIONS    . ORIF MANDIBULAR FRACTURE N/A 12/19/2015   Procedure: OPEN REDUCTION INTERNAL FIXATION (ORIF) MANDIBULAR FRACTURE;  Surgeon: Izora Gala, MD;  Location: [place];  Service: ENT;  Laterality: N/A;  . PATELLA FRACTURE SURGERY Left 1976   plate to knee cap from accident  . POLYPECTOMY  07/05/2017   Procedure: POLYPECTOMY;  Surgeon: Danie Binder, MD;  [facility];  Service: Endoscopy;;  colon  . SAVORY DILATION  12/28/2010   SLF:(MAC)J-shaped stomach/nodular mocosa in the distal esophagus/empiric dilation 27m  . SAVORY DILATION N/A 03/02/2016   Procedure: SAVORY DILATION;  Surgeon: SDanie Binder MD;  [facility];  Service: Endoscopy;  Laterality: N/A;    Current Medications: Current Meds  Medication Sig  . albuterol (PROVENTIL HFA;VENTOLIN HFA) 108 (90 Base) MCG/ACT inhaler Inhale 2 puffs into the lungs every 6 (six) hours as needed for wheezing  or shortness of breath.   [name] Kitchen albuterol (PROVENTIL) (2.5 MG/3ML) 0.083% nebulizer solution Take 2.5 mg by nebulization 2 (two) times daily.  [name] Kitchen alprazolam (XANAX) 2 MG tablet Take 2 mg by mouth 2 (two) times daily.  [name] Kitchen amLODipine (NORVASC) 5 MG tablet Take 1 tablet (5 mg total) by mouth daily.  [name] Kitchen aspirin-sod bicarb-citric acid (ALKA-SELTZER) 325 MG TBEF tablet Take 650 mg by mouth every 6 (six) hours as needed (immune support).  [name] Kitchen atorvastatin (LIPITOR) 80 MG tablet Take 1 tablet (80 mg total) by mouth daily.  . beclomethasone (QVAR) 80 MCG/ACT inhaler Inhale 2 puffs into the lungs 2 (two) times daily as needed (respiratory issues.).   [name] Kitchen BELSOMRA 15 MG TABS Take 1 tablet by mouth at bedtime.  . cetirizine (ZYRTEC) 10 MG tablet Take 10 mg by mouth daily as needed for allergies.   [name] Kitchen clopidogrel (PLAVIX) 75 MG tablet Take 75 mg by mouth daily.  [name] Kitchen dexlansoprazole (DEXILANT) 60 MG capsule Take 1 capsule (60 mg total) by mouth daily.  . Evolocumab (REPATHA SURECLICK) 053 MG/ML  SOAJ Inject 140 mg into the skin every 14 (fourteen) days.  [name] Kitchen ezetimibe (ZETIA) 10 MG tablet Take 10 mg by mouth daily.  . fluticasone (FLONASE) 50 MCG/ACT nasal spray Place 2 sprays into both nostrils daily.   . Fluticasone Furoate (ARNUITY ELLIPTA) 200 MCG/ACT AEPB Inhale 1 puff into the lungs daily.  . furosemide (LASIX) 40 MG tablet TAKE 1 TABLET(40 MG) BY MOUTH DAILY  . glipiZIDE (GLUCOTROL) 10 MG tablet Take 10 mg by mouth daily before breakfast.  . hydrochlorothiazide (HYDRODIURIL) 25 MG tablet Take 25 mg by mouth daily.  [name] Kitchen ipratropium (ATROVENT) 0.02 % nebulizer solution Take 0.5 mg by nebulization 2 (two) times daily as needed (for congestion).  [name] Kitchen LINZESS 290 MCG CAPS capsule TAKE 1 CAPSULE(290 MCG) BY MOUTH DAILY BEFORE BREAKFAST  . lisinopril (PRINIVIL,ZESTRIL) 10 MG tablet Take 10 mg by mouth daily.  . Liver Extract (LIVER PO) Take 1 tablet by mouth daily. "Liver Aid"  . magnesium citrate SOLN Take 1 Bottle by mouth as needed for severe constipation.  . metFORMIN (GLUCOPHAGE-XR) 500 MG 24 hr tablet Take 1,000 mg by mouth daily with breakfast.  . metoprolol tartrate (LOPRESSOR) 25 MG tablet Take 0.5 tablets (12.5 mg total) by mouth 2 (two) times daily.  . Multiple Vitamin (MULTIVITAMIN WITH MINERALS) TABS tablet Take 1 tablet by mouth daily.  . Multiple Vitamins-Minerals (EMERGEN-C IMMUNE) PACK Take 1 packet by mouth daily.  . naloxegol oxalate (MOVANTIK) 25 MG TABS tablet Take 1 tablet (25 mg total) by mouth daily.  [name] Kitchen NARCAN 4 MG/0.1ML LIQD nasal spray kit Place 1 spray into the nose as needed (opioid overdose).   . nitroGLYCERIN (NITROSTAT) 0.4 MG SL tablet SEE NOTES  . oxyCODONE-acetaminophen (PERCOCET) 10-325 MG tablet Take 1 tablet by mouth every 4 (four) hours as needed for pain.  . potassium chloride SA (KLOR-CON) 20 MEQ tablet Take 20 mEq by mouth 2 (two) times daily.  . Pseudoeph-Doxylamine-DM-APAP (NYQUIL PO) Take 1 Dose by mouth at bedtime as needed (cough).  .  Pseudoephedrine-APAP-DM (DAYQUIL PO) Take 1 Dose by mouth daily as needed (cough).  . sertraline (ZOLOFT) 100 MG tablet Take 100 mg by mouth daily.  . sitaGLIPtin (JANUVIA) 100 MG tablet Take 100 mg by mouth daily.  . tamsulosin (FLOMAX) 0.4 MG CAPS capsule Take 1 capsule (0.4 mg total) by mouth daily after breakfast.  . Vitamin D, Ergocalciferol, (DRISDOL) 1.25 MG (50000 UT) CAPS capsule Take  50,000 Units by mouth every 7 (seven) days.  . [DISCONTINUED] ranolazine (RANEXA) 500 MG 12 hr tablet Take 500 mg by mouth 2 (two) times daily.     Allergies:   Gabapentin, Ibuprofen, Zolpidem tartrate, and Naproxen   Social History   Socioeconomic History  . Marital status: Single    Spouse name: Not on file  . Number of children: 2  . Years of education: Not on file  . Highest education level: Not on file  Occupational History  . Occupation: disabled    Fish farm manager: NOT EMPLOYED  Tobacco Use  . Smoking status: Never Smoker  . Smokeless tobacco: Never Used  Substance and Sexual Activity  . Alcohol use: No    Alcohol/week: 0.0 standard drinks  . 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 Sexual activity: Not Currently  Other Topics Concern  . Not on file  Social History Narrative   Lives w/ son-23/22   Social Determinants of Health   Financial Resource Strain:   . Difficulty of Paying Living Expenses:   Food Insecurity:   . Worried About Charity fundraiser in the Last Year:   . Arboriculturist in the Last Year:   Transportation Needs:   . Film/video editor (Medical):   [name] Kitchen Lack of Transportation (Non-Medical):   Physical Activity:   . Days of Exercise per Week:   . Minutes of Exercise per Session:   Stress:   . Feeling of Stress :   Social Connections:   . Frequency of Communication with Friends and Family:   . Frequency of Social Gatherings with Friends and Family:   . Attends Religious Services:     . Active Member of Clubs or Organizations:   . Attends Archivist Meetings:   [name] Kitchen Marital Status:      Family History: The patient's family history includes Diabetes in his father and mother; Heart attack in his brother, sister, and another family member; Heart attack (age of onset: 96) in his father; Heart attack (age of onset: 42) in his mother; Heart failure in an other family member; Hypertension in his father and mother; Seizures in his brother. There is no history of Colon cancer, Liver disease, Anesthesia problems, Hypotension, Malignant hyperthermia, Pseudochol deficiency, or Colon polyps.  ROS:   Please see the history of present illness.    He has chronic opiate dependent back pain syndrome.  Has a difficult time sleeping.  Says he has a difficult time breathing.  All other systems reviewed and are negative.  EKGs/Labs/Other Studies Reviewed:    The following studies were reviewed today:  Cardiac catheterization 2013 Einar Gip): IMPRESSIONS:  1. Normal coronary arteries, right dominant circulation. Patient states that he's had coronary stent in the past. I suspect this is probably a subclavian stent. LIMA to LAD is atretic. There is no significant coronary artery disease. LVEF: Normal 2. Patent left subclavian artery stent.   ECHOCARDIOGRAM 2019: -------------------------------------------------------------------  Study Conclusions   - Left ventricle: The cavity size was normal. Wall thickness was  increased in a pattern of mild LVH. Systolic function was normal.  The estimated ejection fraction was in the range of 60% to 65%.   EKG:  EKG normal sinus rhythm, 75 bpm.  Nonspecific T wave flattening.  Otherwise normal in appearance.  Recent Labs: 03/06/2019: BUN 9; Creatinine, Ser 0.96; Hemoglobin 13.3; Platelets 218; Potassium 4.1; Sodium 139  Recent Lipid Panel    Component Value Date/Time   CHOL 123 01/11/2019 1120   TRIG 306 (H) 01/11/2019 1120   HDL 36  (L) 01/11/2019 1120   CHOLHDL 3.4 01/11/2019 1120   CHOLHDL 2.0 12/26/2017 1112   VLDL 69 (H) 12/16/2015 0630   LDLCALC 41 01/11/2019 1120   LDLCALC 10 12/26/2017 1112    Physical Exam:    VS:  BP 132/82   Pulse 75   Ht _0  (1.778 m)   Wt 285 lb (129.3 kg)   SpO2 99%   BMI 40.89 kg/m     Wt Readings from Last 3 Encounters:  06/19/19 285 lb (129.3 kg)  03/06/19 267 lb (121.1 kg)  02/08/19 277 lb 9.6 oz (125.9 kg)     GEN: Obese. No acute distress HEENT: Normal NECK: No JVD. LYMPHATICS: No lymphadenopathy CARDIAC:  RRR without murmur, gallop, or edema. VASCULAR:  Normal Pulses. No bruits. RESPIRATORY:  Clear to auscultation without rales, wheezing or rhonchi  ABDOMEN: Soft, non-tender, non-distended, No pulsatile mass, MUSCULOSKELETAL: No deformity  SKIN: Warm and dry NEUROLOGIC:  Alert and oriented x 3 PSYCHIATRIC:  Normal affect   ASSESSMENT:    1. Coronary artery disease of bypass graft of native heart with stable angina pectoris ([place])   2. Chronic heart failure with preserved ejection fraction ([place])   3. Hyperlipidemia LDL goal <70   4. Essential hypertension   5. PAD (peripheral artery disease) ([place])   6. Educated about COVID-19 virus infection    PLAN:    In order of problems listed above:  1. Secondary prevention discussed.  Had single LIMA graft to the LAD if ostial disease in 2002.  Subsequently had atresia of the LIMA but documentation of widely patent native LAD. 2. Shortness of breath is his major complaint but may be associated with diastolic dysfunction.  Discussed limiting water intake to less than 2 L/day and preferably less than 1.5 L/day.  May need to do BNP when blood work is checked in the future. 3. On Repatha, atorvastatin 80 mg/day, and Zetia LDL is less than 70.  Most recent lab was in November 2020. 4. Target blood pressure 130/80 mmHg.  Continue amlodipine, lisinopril, furosemide, metoprolol, and potassium. 5. No complaints that suggest  claudication.  Does have spinal stenosis and perhaps radiculopathy. 6. COVID-19 vaccine has been received.  Social distancing and mask wearing is being practiced.  Overall education and awareness concerning primary/secondary risk prevention was discussed in detail: LDL less than 70, hemoglobin A1c less than 7, blood pressure target less than 130/80 mmHg, >150 minutes of moderate aerobic activity per week, avoidance of smoking, weight control (via diet and exercise), and continued surveillance/management of/for obstructive sleep apnea.  1 month follow-up after additional investigation of the records.   Medication Adjustments/Labs and Tests Ordered: Current medicines are reviewed at length with the patient today.  Concerns regarding medicines are outlined above.  Orders Placed This Encounter  Procedures  . EKG 12-Lead   No orders of the defined types were placed in this encounter.   Patient Instructions  Medication Instructions:  Your physician recommends that you continue on your current medications as directed. Please refer to the Current Medication list given to you today.  *If you need a refill on your cardiac medications before your next appointment, please call your pharmacy*   Lab Work: None If you have labs (blood work) drawn today and your tests are completely normal, you will receive your results only by: [name] Kitchen  MyChart Message (if you have MyChart) OR . A paper copy in the mail If you have any lab test that is abnormal or we need to change your treatment, we will call you to review the results.   Testing/Procedures: None   Follow-Up: At [facility], you and your health needs are our priority.  As part of our continuing mission to provide you with exceptional heart care, we have created designated Provider Care Teams.  These Care Teams include your primary Cardiologist (physician) and Advanced Practice Providers (APPs -  Physician Assistants and Nurse Practitioners) who all  work together to provide you with the care you need, when you need it.  We recommend signing up for the patient portal called "MyChart".  Sign up information is provided on this After Visit Summary.  MyChart is used to connect with patients for Virtual Visits (Telemedicine).  Patients are able to view lab/test results, encounter notes, upcoming appointments, etc.  Non-urgent messages can be sent to your provider as well.   To learn more about what you can do with MyChart, go to [email or web address].    Your next appointment:   1 month(s)  The format for your next appointment:   In Person  Provider:   You may see Dr. Daneen Schick or one of the following Advanced Practice Providers on your designated Care Team:    Truitt Merle, NP  Cecilie Kicks, NP  Kathyrn Drown, NP    Other Instructions  Do not exceed 1576m of fluid a day.     Signed, HSinclair Grooms MD  06/19/2019 12:12 PM    [place]

## 2019-06-19 ENCOUNTER — Ambulatory Visit (INDEPENDENT_AMBULATORY_CARE_PROVIDER_SITE_OTHER): Payer: Medicare Other | Admitting: Interventional Cardiology

## 2019-06-19 ENCOUNTER — Other Ambulatory Visit: Payer: Self-pay

## 2019-06-19 ENCOUNTER — Encounter: Payer: Self-pay | Admitting: Interventional Cardiology

## 2019-06-19 VITALS — BP 132/82 | HR 75 | Ht 70.0 in | Wt 285.0 lb

## 2019-06-19 DIAGNOSIS — I5032 Chronic diastolic (congestive) heart failure: Secondary | ICD-10-CM

## 2019-06-19 DIAGNOSIS — I25708 Atherosclerosis of coronary artery bypass graft(s), unspecified, with other forms of angina pectoris: Secondary | ICD-10-CM | POA: Diagnosis not present

## 2019-06-19 DIAGNOSIS — I10 Essential (primary) hypertension: Secondary | ICD-10-CM

## 2019-06-19 DIAGNOSIS — E785 Hyperlipidemia, unspecified: Secondary | ICD-10-CM

## 2019-06-19 DIAGNOSIS — I739 Peripheral vascular disease, unspecified: Secondary | ICD-10-CM

## 2019-06-19 DIAGNOSIS — Z7189 Other specified counseling: Secondary | ICD-10-CM

## 2019-06-19 NOTE — Patient Instructions (Signed)
Medication Instructions:  Your physician recommends that you continue on your current medications as directed. Please refer to the Current Medication list given to you today.  *If you need a refill on your cardiac medications before your next appointment, please call your pharmacy*   Lab Work: None If you have labs (blood work) drawn today and your tests are completely normal, you will receive your results only by: [name] Kitchen MyChart Message (if you have MyChart) OR . A paper copy in the mail If you have any lab test that is abnormal or we need to change your treatment, we will call you to review the results.   Testing/Procedures: None   Follow-Up: At [facility], you and your health needs are our priority.  As part of our continuing mission to provide you with exceptional heart care, we have created designated Provider Care Teams.  These Care Teams include your primary Cardiologist (physician) and Advanced Practice Providers (APPs -  Physician Assistants and Nurse Practitioners) who all work together to provide you with the care you need, when you need it.  We recommend signing up for the patient portal called "MyChart".  Sign up information is provided on this After Visit Summary.  MyChart is used to connect with patients for Virtual Visits (Telemedicine).  Patients are able to view lab/test results, encounter notes, upcoming appointments, etc.  Non-urgent messages can be sent to your provider as well.   To learn more about what you can do with MyChart, go to [email or web address].    Your next appointment:   1 month(s)  The format for your next appointment:   In Person  Provider:   You may see Dr. Daneen Schick or one of the following Advanced Practice Providers on your designated Care Team:    Truitt Merle, NP  Cecilie Kicks, NP  Kathyrn Drown, NP    Other Instructions  Do not exceed 1524mL of fluid a day.

## 2019-07-18 ENCOUNTER — Ambulatory Visit: Payer: Medicare Other | Admitting: Interventional Cardiology

## 2019-07-27 ENCOUNTER — Other Ambulatory Visit: Payer: Self-pay | Admitting: Cardiology

## 2019-08-08 NOTE — Progress Notes (Signed)
CARDIOLOGY OFFICE NOTE  Date:  08/14/2019    Dalton [name] Date of Birth: 1961/02/17 Medical Record #[id number]  PCP:  Nolene Ebbs, MD  Cardiologist:  Tamala Julian   Chief Complaint  Patient presents with  . Follow-up    History of Present Illness: Dalton [name] is a 59 y.o. male who presents today for a 3 month check. Seen for Dr. Tamala Julian.   He has a known history of CAD with prior single-vessel CABG 2002 (LIMA to LAD) with prior cardiac catheterization showing atretic LIMA with normal native coronary arteries, multiple prior MIs perhaps in the setting of cocaine use, ischemic cardiomyopathy (last EF greater than 50%), HTN, HLD, type 2 diabetes mellitus, left subclavian stent, rheumatoid arthritis, DVT, peptic ulcer disease, bipolar disorder, and past cocaine use.   He was seen here in April to establish with Dr. Tamala Julian - has seen Dr. Einar Gip, Dr. Saunders Revel, and Dr. Carlyle Dolly in the past.  There is noted remote mention of left subclavian steal per Dr. Einar Gip in a 2013 catheterization note that was not verified by hemodynamics.  Seen here back in April - noted to be very difficult to get his history - could not remember his CABG date - thought he had had more than a 1 vessel CABG - medicines were not correct. Said a Software engineer had told him to stop Ranexa. Noted he had a "weak" heart. Minimal chest pain. Always with DOE/orthopnea.   The patient does not have symptoms concerning for COVID-19 infection (fever, chills, cough, or new shortness of breath).   Comes in today. Here alone. He is on Medicaid/Medicare. Disabled. Trying to do some walking. Feels like he is having more chest pain and shortness of breath. Some swelling on occasion.  Using NTG - did this 3 times over the past month which he thought was more than his usual routine. Not on Ranexa - looks like he did not tolerate this in the past. Also had some headache with Imdur - but never tried taking at night. On lots of medicines.  Hard to say how much salt he is getting. Denies drug use. No recent labs noted. BP is up some today.   Past Medical History:  Diagnosis Date  . Anemia   . Anxiety   . Asthma   . Bipolar disorder ([place])   . CHF (congestive heart failure) ([place])   . Chronic back pain    Pain Clinic in [place]  . Chronic bronchitis   . Chronic lower back pain   . Congestive heart failure (CHF) ([place])   . COPD (chronic obstructive pulmonary disease) ([place])    per patient 03/05/19  . Coronary artery disease   . Coughing   . Depression   . DVT (deep venous thrombosis) ([place]) ~ 2005   LLE  . Frequency of urination   . GERD (gastroesophageal reflux disease)   . 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 GYIRSWNI(627.0)    "a few times/week" (12/24/2015)  . High cholesterol   . HNP (herniated nucleus pulposus), cervical   . Hypertension   . Laceration of right hand 11/27/2010  . Laceration of wrist 2007 BIL FOREARMS  . MI (myocardial infarction) ([place])    7, last one was in 2011 (12/24/2015)  . Neuromuscular disorder (H[facility])    legs/feet  . Neuropathy    legs/feet  . NSAID-induced gastric ulcer    "Ibuprofen"  . PUD (peptic  ulcer disease)    in 1990s, secondary to medication  . Rheumatoid arthritis ([place])    "all over" (12/24/2015)  . Shortness of breath    with exertion  . Tonsillitis, chronic    Dr. Vicki Mallet in [place]  . Type II diabetes mellitus ([place])   . Wears glasses     Past Surgical History:  Procedure Laterality Date  . ANTERIOR CERVICAL DECOMP/DISCECTOMY FUSION N/A 11/05/2016   Procedure: Anterior Cervical Discectomy and Fusion - Cervical three-Cervical four;  Surgeon: Earnie Larsson, MD;  Location: [place];  Service: Neurosurgery;  Laterality: N/A;  . BACK SURGERY     x3  . BIOPSY N/A 05/30/2012   Procedure: BIOPSY;  Surgeon: Danie Binder, MD;  Location: [facility];  Service: Endoscopy;  Laterality: N/A;  . BIOPSY  03/02/2016    Procedure: BIOPSY;  Surgeon: Danie Binder, MD;  [facility];  Service: Endoscopy;;  gastric  . CARDIAC CATHETERIZATION  "several"  . CARPAL TUNNEL RELEASE Bilateral   . CATARACT EXTRACTION W/PHACO Right 06/15/2016   Procedure: CATARACT EXTRACTION PHACO AND INTRAOCULAR LENS PLACEMENT (IOC);  Surgeon: Rutherford Guys, MD;  Location: [facility];  Service: Ophthalmology;  Laterality: Right;  CDE: 4.64  . CATARACT EXTRACTION W/PHACO Left 07/13/2016   Procedure: CATARACT EXTRACTION PHACO AND INTRAOCULAR LENS PLACEMENT (IOC);  Surgeon: Rutherford Guys, MD;  Location: [facility];  Service: Ophthalmology;  Laterality: Left;  CDE: 3.15  . COLONOSCOPY  12/28/2010   SLF: (MAC)Internal hemorrhoids/four small colon polyps tubular adenomas. per SLF: colonoscopy 2022  . COLONOSCOPY WITH PROPOFOL N/A 07/05/2017   Procedure: COLONOSCOPY WITH PROPOFOL;  Surgeon: Danie Binder, MD;  [facility];  Service: Endoscopy;  Laterality: N/A;  7:30am  . CORONARY ARTERY BYPASS GRAFT  2002   3 vessels  . ESOPHAGOGASTRODUODENOSCOPY N/A 05/30/2012   SLF: UNCONTROLLED GERD DUE TO LIFESTYLE CHOICE/WEIGHT GAIN/MILD Non-erosive gastritis  . ESOPHAGOGASTRODUODENOSCOPY (EGD) WITH PROPOFOL N/A 03/02/2016   Dr. Oneida Alar: Esophagus appeared normal, impaired dilation performed, patchy inflammation with edema and erythema of the entire stomach., Biopsy with H pylori, patient completed Pylera.   [name] Kitchen FRACTURE SURGERY    . LEFT HEART CATHETERIZATION WITH CORONARY ANGIOGRAM N/A 08/31/2011   Procedure: LEFT HEART CATHETERIZATION WITH CORONARY ANGIOGRAM;  Surgeon: Laverda Page, MD;  Location: [facility] CATH LAB;  Service: Cardiovascular;  Laterality: N/A;  . LUMBAR DISC SURGERY     "L4-5; Dr. Trenton Gammon"  . LUMBAR LAMINECTOMY/DECOMPRESSION MICRODISCECTOMY Right 03/06/2019   Procedure: MICRODISCECTOMY EXTRAFORAMINAL LUMBAR THREE - LUMBAR FOUR RIGHT;  Surgeon: Earnie Larsson, MD;  Location: [place];  Service: Neurosurgery;  Laterality: Right;   MICRODISCECTOMY EXTRAFORAMINAL LUMBAR THREE - LUMBAR FOUR RIGHT  . MULTIPLE TOOTH EXTRACTIONS    . ORIF MANDIBULAR FRACTURE N/A 12/19/2015   Procedure: OPEN REDUCTION INTERNAL FIXATION (ORIF) MANDIBULAR FRACTURE;  Surgeon: Izora Gala, MD;  Location: [place];  Service: ENT;  Laterality: N/A;  . PATELLA FRACTURE SURGERY Left 1976   plate to knee cap from accident  . POLYPECTOMY  07/05/2017   Procedure: POLYPECTOMY;  Surgeon: Danie Binder, MD;  [facility];  Service: Endoscopy;;  colon  . SAVORY DILATION  12/28/2010   SLF:(MAC)J-shaped stomach/nodular mocosa in the distal esophagus/empiric dilation 46m  . SAVORY DILATION N/A 03/02/2016   Procedure: SAVORY DILATION;  Surgeon: SDanie Binder MD;  [facility];  Service: Endoscopy;  Laterality: N/A;     Medications: Current Meds  Medication Sig  . albuterol (PROVENTIL HFA;VENTOLIN HFA) 108 (  90 Base) MCG/ACT inhaler Inhale 2 puffs into the lungs every 6 (six) hours as needed for wheezing or shortness of breath.   [name] Kitchen albuterol (PROVENTIL) (2.5 MG/3ML) 0.083% nebulizer solution Take 2.5 mg by nebulization 2 (two) times daily.  [name] Kitchen ALPRAZolam (XANAX) 1 MG tablet Take 1 mg by mouth 3 (three) times daily as needed.  [name] Kitchen alprazolam (XANAX) 2 MG tablet Take 2 mg by mouth 2 (two) times daily.  [name] Kitchen aspirin-sod bicarb-citric acid (ALKA-SELTZER) 325 MG TBEF tablet Take 650 mg by mouth every 6 (six) hours as needed (immune support).  [name] Kitchen atorvastatin (LIPITOR) 80 MG tablet Take 1 tablet (80 mg total) by mouth daily.  . beclomethasone (QVAR) 80 MCG/ACT inhaler Inhale 2 puffs into the lungs 2 (two) times daily as needed (respiratory issues.).   [name] Kitchen BELSOMRA 15 MG TABS Take 1 tablet by mouth at bedtime.  . cetirizine (ZYRTEC) 10 MG tablet Take 10 mg by mouth daily as needed for allergies.   [name] Kitchen clopidogrel (PLAVIX) 75 MG tablet Take 75 mg by mouth daily.  [name] Kitchen dexlansoprazole (DEXILANT) 60 MG capsule Take 1 capsule (60 mg total) by mouth daily.  .  Evolocumab (REPATHA SURECLICK) 938 MG/ML SOAJ Inject 140 mg into the skin every 14 (fourteen) days.  [name] Kitchen ezetimibe (ZETIA) 10 MG tablet Take 10 mg by mouth daily.  . fluticasone (FLONASE) 50 MCG/ACT nasal spray Place 2 sprays into both nostrils daily.   . Fluticasone Furoate (ARNUITY ELLIPTA) 200 MCG/ACT AEPB Inhale 1 puff into the lungs daily.  . furosemide (LASIX) 40 MG tablet TAKE 1 TABLET(40 MG) BY MOUTH DAILY  . glipiZIDE (GLUCOTROL) 10 MG tablet Take 10 mg by mouth daily before breakfast.  . hydrochlorothiazide (HYDRODIURIL) 25 MG tablet Take 25 mg by mouth daily.  [name] Kitchen ipratropium (ATROVENT) 0.02 % nebulizer solution Take 0.5 mg by nebulization 2 (two) times daily as needed (for congestion).  [name] Kitchen LINZESS 290 MCG CAPS capsule TAKE 1 CAPSULE(290 MCG) BY MOUTH DAILY BEFORE BREAKFAST  . lisinopril (PRINIVIL,ZESTRIL) 10 MG tablet Take 10 mg by mouth daily.  . Liver Extract (LIVER PO) Take 1 tablet by mouth daily. "Liver Aid"  . magnesium citrate SOLN Take 1 Bottle by mouth as needed for severe constipation.  . metFORMIN (GLUCOPHAGE-XR) 500 MG 24 hr tablet Take 1,000 mg by mouth daily with breakfast.  . metoprolol tartrate (LOPRESSOR) 25 MG tablet Take 0.5 tablets (12.5 mg total) by mouth 2 (two) times daily.  . Multiple Vitamin (MULTIVITAMIN WITH MINERALS) TABS tablet Take 1 tablet by mouth daily.  . Multiple Vitamins-Minerals (EMERGEN-C IMMUNE) PACK Take 1 packet by mouth daily.  . naloxegol oxalate (MOVANTIK) 25 MG TABS tablet Take 1 tablet (25 mg total) by mouth daily.  [name] Kitchen NARCAN 4 MG/0.1ML LIQD nasal spray kit Place 1 spray into the nose as needed (opioid overdose).   . nitroGLYCERIN (NITROSTAT) 0.4 MG SL tablet SEE NOTES  . oxyCODONE-acetaminophen (PERCOCET) 10-325 MG tablet Take 1 tablet by mouth every 4 (four) hours as needed for pain.  . potassium chloride SA (KLOR-CON) 20 MEQ tablet Take 20 mEq by mouth 2 (two) times daily.  . Pseudoeph-Doxylamine-DM-APAP (NYQUIL PO) Take 1 Dose by mouth at  bedtime as needed (cough).  . Pseudoephedrine-APAP-DM (DAYQUIL PO) Take 1 Dose by mouth daily as needed (cough).  . sertraline (ZOLOFT) 100 MG tablet Take 100 mg by mouth daily.  . sitaGLIPtin (JANUVIA) 100 MG tablet Take 100 mg by mouth daily.  . tamsulosin (FLOMAX) 0.4 MG CAPS capsule Take 1 capsule (0.4  mg total) by mouth daily after breakfast.  . Vitamin D, Ergocalciferol, (DRISDOL) 1.25 MG (50000 UT) CAPS capsule Take 50,000 Units by mouth every 7 (seven) days.     Allergies: Allergies  Allergen Reactions  . Gabapentin Hives  . Ibuprofen Other (See Comments)    Stomach  Upset and stomach ulcers  . Zolpidem Tartrate Other (See Comments)    Hallucinations   . Naproxen Other (See Comments)    HALLUCINATIONS    Social History: The patient  reports that he has never smoked. He has never used smokeless tobacco. He reports previous drug use. Drugs: "Crack" cocaine, Cocaine, and Marijuana. He reports that he does not drink alcohol.   Family History: The patient's family history includes Diabetes in his father and mother; Heart attack in his brother, sister, and another family member; Heart attack (age of onset: 53) in his father; Heart attack (age of onset: 41) in his mother; Heart failure in an other family member; Hypertension in his father and mother; Seizures in his brother.   Review of Systems: Please see the history of present illness.   All other systems are reviewed and negative.   Physical Exam: VS:  BP (!) 150/80   Pulse 83   Ht _0  (1.778 m)   Wt 286 lb 6.4 oz (129.9 kg)   SpO2 98%   BMI 41.09 kg/m  .  BMI Body mass index is 41.09 kg/m.  Wt Readings from Last 3 Encounters:  08/14/19 286 lb 6.4 oz (129.9 kg)  06/19/19 285 lb (129.3 kg)  03/06/19 267 lb (121.1 kg)   Repeat BP by me is 140/74.   General: Alert. In no acute distress.  Cardiac: Regular rate and rhythm. No murmurs, rubs, or gallops. No significant edema.  Respiratory:  Lungs are clear to  auscultation bilaterally with normal work of breathing.  GI: Soft and nontender.  MS: No deformity or atrophy. Gait and ROM intact.  Skin: Warm and dry. Color is normal.  Neuro:  Strength and sensation are intact and no gross focal deficits noted.  Psych: Alert, appropriate and with normal affect.   LABORATORY DATA:  EKG:  EKG is ordered today. This is personally reviewed by me. This demonstrates NSR - PACs  Lab Results  Component Value Date   WBC 6.1 03/06/2019   HGB 13.3 03/06/2019   HCT 40.1 03/06/2019   PLT 218 03/06/2019   GLUCOSE 111 (H) 03/06/2019   CHOL 123 01/11/2019   TRIG 306 (H) 01/11/2019   HDL 36 (L) 01/11/2019   LDLCALC 41 01/11/2019   ALT 25 12/26/2017   AST 24 12/26/2017   NA 139 03/06/2019   K 4.1 03/06/2019   CL 104 03/06/2019   CREATININE 0.96 03/06/2019   BUN 9 03/06/2019   CO2 28 03/06/2019   TSH 2.759 12/16/2015   INR 1.08 12/10/2015   HGBA1C 6.1 (H) 03/06/2019     BNP (last 3 results) No results for input(s): BNP in the last 8760 hours.  ProBNP (last 3 results) No results for input(s): PROBNP in the last 8760 hours.   Other Studies Reviewed Today:  Myoview Study Result 10/2018  Narrative & Impression   There was no ST segment deviation noted during stress.  Findings consistent with small prior mid to distal anterior myocardial infarction with mild peri-infarct ischemia.  This is a low risk study.  The left ventricular ejection fraction is normal (55-65%).    Cardiac catheterization 2013 Einar Gip): IMPRESSIONS:  1. Normal coronary arteries,  right dominant circulation. Patient states that he's had coronary stent in the past. I suspect this is probably a subclavian stent. LIMA to LAD is atretic. There is no significant coronary artery disease. LVEF: Normal 2. Patent left subclavian artery stent.   ECHOCARDIOGRAM 2019: -------------------------------------------------------------------  Study Conclusions   - Left ventricle: The  cavity size was normal. Wall thickness was  increased in a pattern of mild LVH. Systolic function was normal.  The estimated ejection fraction was in the range of 60% to 65%.    ASSESSMENT & PLAN:    1. CAD - feels like he is having more bouts of chest pain. EKG without acute changes. Low risk Myoview from September of 2020 noted. Would favor continuing medical management. Did not tolerate Ranexa in the past according to prior note of Dr. Nelly Laurence due to fatigue. Had headache with Imdur noted in the past but is willing to retry low dose and try taking at night. May need cardiac cath repeated. Lab today. Will try Imdur 15 mg QSh.   2. Remote CABG - known atretic LIMA graft. Otherwise, was basically normal.   3. Prior drug use - denies at this time.   4. HTN - repeat is a little better by me.   5. HLD - on statin and PCKS9 therapy and Zetia  6. Obesity  7. Probable diastolic dysfunction - needs salt restriction    Current medicines are reviewed with the patient today.  The patient does not have concerns regarding medicines other than what has been noted above.  The following changes have been made:  See above.  Labs/ tests ordered today include:    Orders Placed This Encounter  Procedures  . Basic metabolic panel  . CBC  . Hepatic function panel  . Lipid panel     Disposition:   FU with Dr. Tamala Julian in about a month.    Patient is agreeable to this plan and will call if any problems develop in the interim.   SignedTruitt Merle, NP  08/14/2019 12:34 PM  [facility] [address] [place], [place]  [address] Phone: [phone] Fax: [phone]

## 2019-08-10 ENCOUNTER — Ambulatory Visit: Payer: Medicare Other | Admitting: Cardiology

## 2019-08-14 ENCOUNTER — Ambulatory Visit: Payer: Medicare Other | Admitting: Nurse Practitioner

## 2019-08-14 ENCOUNTER — Other Ambulatory Visit: Payer: Self-pay

## 2019-08-14 ENCOUNTER — Encounter: Payer: Self-pay | Admitting: Nurse Practitioner

## 2019-08-14 ENCOUNTER — Ambulatory Visit (INDEPENDENT_AMBULATORY_CARE_PROVIDER_SITE_OTHER): Payer: Medicare Other | Admitting: Nurse Practitioner

## 2019-08-14 VITALS — BP 150/80 | HR 83 | Ht 70.0 in | Wt 286.4 lb

## 2019-08-14 DIAGNOSIS — I25118 Atherosclerotic heart disease of native coronary artery with other forms of angina pectoris: Secondary | ICD-10-CM | POA: Diagnosis not present

## 2019-08-14 DIAGNOSIS — R079 Chest pain, unspecified: Secondary | ICD-10-CM

## 2019-08-14 DIAGNOSIS — E782 Mixed hyperlipidemia: Secondary | ICD-10-CM | POA: Diagnosis not present

## 2019-08-14 LAB — LIPID PANEL
Chol/HDL Ratio: 3.5 ratio (ref 0.0–5.0)
Cholesterol, Total: 131 mg/dL (ref 100–199)
HDL: 37 mg/dL — ABNORMAL LOW (ref >39)
LDL Chol Calc (NIH): 54 mg/dL (ref 0–99)
Triglycerides: 255 mg/dL — ABNORMAL HIGH (ref 0–149)
VLDL Cholesterol Cal: 40 mg/dL (ref 5–40)

## 2019-08-14 LAB — CBC
Hematocrit: 36 % — ABNORMAL LOW (ref 37.5–51.0)
Hemoglobin: 12.9 g/dL — ABNORMAL LOW (ref 13.0–17.7)
MCH: 30 pg (ref 26.6–33.0)
MCHC: 35.8 g/dL — ABNORMAL HIGH (ref 31.5–35.7)
MCV: 84 fL (ref 79–97)
Platelets: 234 10*3/uL (ref 150–450)
RBC: 4.3 x10E6/uL (ref 4.14–5.80)
RDW: 13.7 % (ref 11.6–15.4)
WBC: 5.7 10*3/uL (ref 3.4–10.8)

## 2019-08-14 LAB — HEPATIC FUNCTION PANEL
ALT: 34 IU/L (ref 0–44)
AST: 27 IU/L (ref 0–40)
Albumin: 4.3 g/dL (ref 3.8–4.9)
Alkaline Phosphatase: 93 IU/L (ref 48–121)
Bilirubin Total: 0.4 mg/dL (ref 0.0–1.2)
Bilirubin, Direct: 0.19 mg/dL (ref 0.00–0.40)
Total Protein: 7.1 g/dL (ref 6.0–8.5)

## 2019-08-14 LAB — BASIC METABOLIC PANEL
BUN/Creatinine Ratio: 7 — ABNORMAL LOW (ref 9–20)
BUN: 6 mg/dL (ref 6–24)
CO2: 23 mmol/L (ref 20–29)
Calcium: 9.2 mg/dL (ref 8.7–10.2)
Chloride: 103 mmol/L (ref 96–106)
Creatinine, Ser: 0.85 mg/dL (ref 0.76–1.27)
GFR calc Af Amer: 111 mL/min/{1.73_m2} (ref >59)
GFR calc non Af Amer: 96 mL/min/{1.73_m2} (ref >59)
Glucose: 109 mg/dL — ABNORMAL HIGH (ref 65–99)
Potassium: 3.9 mmol/L (ref 3.5–5.2)
Sodium: 138 mmol/L (ref 134–144)

## 2019-08-14 MED ORDER — ISOSORBIDE MONONITRATE ER 30 MG PO TB24: 15.0000 mg | ORAL_TABLET | Freq: Every day | ORAL | 3 refills | Status: DC

## 2019-08-14 NOTE — Patient Instructions (Addendum)
After Visit Summary:  We will be checking the following labs today - BMET, CBC, HPF, Lipids   Medication Instructions:    Continue with your current medicines. BUT  We are going to retry low dose Imdur 15 mg - take this at night. This can help prevent the headache.    If you need a refill on your cardiac medications before your next appointment, please call your pharmacy.     Testing/Procedures To Be Arranged:  N/A  Follow-Up:   See Dr Tamala Julian in about a month    At [facility], you and your health needs are our priority.  As part of our continuing mission to provide you with exceptional heart care, we have created designated Provider Care Teams.  These Care Teams include your primary Cardiologist (physician) and Advanced Practice Providers (APPs -  Physician Assistants and Nurse Practitioners) who all work together to provide you with the care you need, when you need it.  Special Instructions:  . Stay safe, wash your hands for at least 20 seconds and wear a mask when needed.  . It was good to talk with you today.    Call the [place] office at [phone] if you have any questions, problems or concerns.

## 2019-08-15 ENCOUNTER — Other Ambulatory Visit: Payer: Self-pay | Admitting: Otolaryngology

## 2019-08-15 DIAGNOSIS — R1319 Other dysphagia: Secondary | ICD-10-CM

## 2019-08-15 DIAGNOSIS — K219 Gastro-esophageal reflux disease without esophagitis: Secondary | ICD-10-CM

## 2019-08-17 NOTE — Addendum Note (Signed)
Addended by: Lanna Poche R on: 08/17/2019 12:09 PM   Modules accepted: Orders

## 2019-08-21 ENCOUNTER — Other Ambulatory Visit: Payer: Self-pay

## 2019-08-21 ENCOUNTER — Telehealth: Payer: Self-pay

## 2019-08-21 ENCOUNTER — Ambulatory Visit (INDEPENDENT_AMBULATORY_CARE_PROVIDER_SITE_OTHER): Payer: Medicare Other | Admitting: Nurse Practitioner

## 2019-08-21 ENCOUNTER — Encounter: Payer: Self-pay | Admitting: Nurse Practitioner

## 2019-08-21 VITALS — BP 129/79 | HR 70 | Temp 98.1°F | Ht 70.0 in | Wt 283.8 lb

## 2019-08-21 DIAGNOSIS — K5903 Drug induced constipation: Secondary | ICD-10-CM

## 2019-08-21 DIAGNOSIS — R1030 Lower abdominal pain, unspecified: Secondary | ICD-10-CM

## 2019-08-21 DIAGNOSIS — K5900 Constipation, unspecified: Secondary | ICD-10-CM | POA: Diagnosis not present

## 2019-08-21 MED ORDER — NALOXEGOL OXALATE 25 MG PO TABS: 25.0000 mg | ORAL_TABLET | Freq: Every day | ORAL | 5 refills | Status: DC

## 2019-08-21 NOTE — Patient Instructions (Signed)
Your health issues we discussed today were:   Constipation with abdominal pain: 1. I have sent a refill of Movantik to your pharmacy.  Take this once a day 2. Give Movantik 2 to 4 weeks to work for your constipation 3. If you continue to have constipation I does not well managed enough on Movantik, you can add Linzess if needed 4. Your abdominal pain is likely due to constipation as well as larger than recommended doses of Linzess (which can cause cramping).  I feel this will improve if we can get your bowel movements better managed on proper dosing of medications 5. Call us for any worsening or severe symptoms  Overall I recommend:  1. Continue your other current medications 2. Return for follow-up in 4 months 3. Call us if you have any questions or concerns.   ---------------------------------------------------------------  I am glad you have gotten your COVID-19 vaccination!  Keep your appointment for your second dose.  Even when you are fully vaccinated you should continue to follow CDC and state/local guidelines.  ---------------------------------------------------------------   At [facility] we value your feedback. You may receive a survey about your visit today. Please share your experience as we strive to create trusting relationships with our patients to provide genuine, compassionate, quality care.  We appreciate your understanding and patience as we review any laboratory studies, imaging, and other diagnostic tests that are ordered as we care for you. Our office policy is 5 business days for review of these results, and any emergent or urgent results are addressed in a timely manner for your best interest. If you do not hear from our office in 1 week, please contact us.   We also encourage the use of MyChart, which contains your medical information for your review as well. If you are not enrolled in this feature, an access code is on this after visit summary for  your convenience. Thank you for allowing [place] to be involved in your care.  It was great to see you today!  I hope you have a great Summer!!

## 2019-08-21 NOTE — Telephone Encounter (Signed)
Pt called office, pharmacy told him Movantik needs PA. States pharmacy is going to fax PA request to office.

## 2019-08-21 NOTE — Assessment & Plan Note (Signed)
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  He stopped taking this because there was some sort of miscommunication with the pharmacy where and he did not have his Movantik listed.  He declined to call.  I have asked him to call us in the future if there is any confusion or questions about medications or ability to obtain medications.  I recommended that he not take more than the recommended dose of medications to prevent possible overdose side effects.  I will send a refill of Movantik to his pharmacy for him to take daily.  Give it 2 to 4 weeks to help and if no improvement he can also take Linzess if needed.  Call for any worsening or severe symptoms and follow-up in 4 months otherwise.

## 2019-08-21 NOTE — Assessment & Plan Note (Signed)
The patient describes persistent lower abdominal discomfort that sometimes improves with a bowel movement when he has a good bowel movement.  Likely related to constipation as well as significant doses of Linzess and associated cramping.  I recommended constipation management as per above.  Follow-up in 4 months.  Call for any worsening or severe pain.

## 2019-08-21 NOTE — Progress Notes (Signed)
Referring Provider: Nolene Ebbs, MD Primary Care Physician:  Nolene Ebbs, MD Primary GI:  Dr. Gala Romney (in the absence of Dr. Oneida Alar); pending Dr. Abbey Chatters  Chief Complaint  Patient presents with  . Constipation  . Abdominal Pain    occ cramping    HPI:   Dalton [name] is a 59 y.o. male who presents for 41-monthfollow-up on constipation and abdominal pain.  The patient was last seen in our office 02/08/2019 for the same.  History of tubular adenoma on colonoscopy next due in 2022.  Previous trial and failure of Amitiza and magnesium citrate, Linzess not effective even at 290 dose, Movantik required trial of Relistor by insurance, Relistor would not be paid for by insurance.  Eventually settled on continued Linzess to 90 mcg and Colace over-the-counter.  Eventually we were able to get Movantik covered.  At his last visit Movantik worked well initially but still needing it daily and he eventually added Linzess and magnesium citrate occasionally with a bowel movement daily on that regimen that was soft and loose.  Lower abdominal cramping improves after bowel movement.  Has more GERD symptoms when constipated.  Noted 2 episodes of scant toilet tissue hematochezia.  No other GI complaints.  Recommended continue current medications and follow-up in 6 months.  Today he states he is still having constipation and abdominal discomfort.  At some point in the past 6 months he stopped taking Movantik.  He is now currently taking only Linzess 290 mcg, although sometimes he will take 2 or 3 at a time. Will take Magnesium Citrate as well. He states he called the drugstore and they said they didn't have an Rx for this, but he did not call our office. I cautioned him about taking more medication than prescribed. When he was on the Movantik it was working for him. Still with abdominal discomfort with constipation; sometimes improves with a bowel movement. Denies hematochezia, melena, N/V, fever, chills,  unintentional weight loss. Denies URI or flu-like symptoms. Denies loss of sense of taste or smell. The patient has received COVID-19 vaccination first dose, has an appointment for his second dose. Denies chest pain, dyspnea, dizziness, lightheadedness, syncope, near syncope. Denies any other upper or lower GI symptoms.  Past Medical History:  Diagnosis Date  . Anemia   . Anxiety   . Asthma   . Bipolar disorder ([place]   . CHF (congestive heart failure) ([place]   . [place] pain    [place]   . Chronic lower back pain   . Congestive heart failure (CHF) ([place]   . COPD (chronic obstructive pulmonary disease) ([place]    per patient 03/05/19  . Coronary artery disease   . Coughing   . Depression   . DVT (deep venous thrombosis) ([place]   LLE  . Frequency of urination   . GERD (gastroesophageal reflux disease)   . 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 HZESPQZRA(076.2    "a few times/week" (12/24/2015)  . High cholesterol   . HNP (herniated nucleus pulposus), cervical   . Hypertension   . Laceration of right hand 11/27/2010  . Laceration of wrist 2007 BIL FOREARMS  . MI (myocardial infarction) ([place]    7, last one was in 2011 (12/24/2015)  . Neuromuscular disorder (HCC)    legs/feet  . Neuropathy    legs/feet  . NSAID-induced gastric ulcer    "  Ibuprofen"  . PUD (peptic ulcer disease)    in 1990s, secondary to medication  . Rheumatoid arthritis ([place])    "all over" (12/24/2015)  . Shortness of breath    with exertion  . Tonsillitis, chronic    Dr. Vicki Mallet in [place]  . Type II diabetes mellitus ([place])   . Wears glasses     Past Surgical History:  Procedure Laterality Date  . ANTERIOR CERVICAL DECOMP/DISCECTOMY FUSION N/A 11/05/2016   Procedure: Anterior Cervical Discectomy and Fusion - Cervical three-Cervical four;  Surgeon: Earnie Larsson, MD;  Location: [place];  Service:  Neurosurgery;  Laterality: N/A;  . BACK SURGERY     x3  . BIOPSY N/A 05/30/2012   Procedure: BIOPSY;  Surgeon: Danie Binder, MD;  [facility];  Service: Endoscopy;  Laterality: N/A;  . BIOPSY  03/02/2016   Procedure: BIOPSY;  Surgeon: Danie Binder, MD;  [facility];  Service: Endoscopy;;  gastric  . CARDIAC CATHETERIZATION  "several"  . CARPAL TUNNEL RELEASE Bilateral   . CATARACT EXTRACTION W/PHACO Right 06/15/2016   Procedure: CATARACT EXTRACTION PHACO AND INTRAOCULAR LENS PLACEMENT (IOC);  Surgeon: Rutherford Guys, MD;  Location[facility];  Service: Ophthalmology;  Laterality: Right;  CDE: 4.64  . CATARACT EXTRACTION W/PHACO Left 07/13/2016   Procedure: CATARACT EXTRACTION PHACO AND INTRAOCULAR LENS PLACEMENT (IOC);  Surgeon: Rutherford Guys, MD;  Location: [facility];  Service: Ophthalmology;  Laterality: Left;  CDE: 3.15  . COLONOSCOPY  12/28/2010   SLF: (MAC)Internal hemorrhoids/four small colon polyps tubular adenomas. per SLF: colonoscopy 2022  . COLONOSCOPY WITH PROPOFOL N/A 07/05/2017   Procedure: COLONOSCOPY WITH PROPOFOL;  Surgeon: Danie Binder, MD;  [facility];  Service: Endoscopy;  Laterality: N/A;  7:30am  . CORONARY ARTERY BYPASS GRAFT  2002   3 vessels  . ESOPHAGOGASTRODUODENOSCOPY N/A 05/30/2012   SLF: UNCONTROLLED GERD DUE TO LIFESTYLE CHOICE/WEIGHT GAIN/MILD Non-erosive gastritis  . ESOPHAGOGASTRODUODENOSCOPY (EGD) WITH PROPOFOL N/A 03/02/2016   Dr. Oneida Alar: Esophagus appeared normal, impaired dilation performed, patchy inflammation with edema and erythema of the entire stomach., Biopsy with H pylori, patient completed Pylera.   [name] Kitchen FRACTURE SURGERY    . LEFT HEART CATHETERIZATION WITH CORONARY ANGIOGRAM N/A 08/31/2011   Procedure: LEFT HEART CATHETERIZATION WITH CORONARY ANGIOGRAM;  Surgeon: Laverda Page, MD;  [facility] LAB;  Service: Cardiovascular;  Laterality: N/A;  . LUMBAR DISC SURGERY     "L4-5; Dr. Trenton Gammon"  . LUMBAR  LAMINECTOMY/DECOMPRESSION MICRODISCECTOMY Right 03/06/2019   Procedure: MICRODISCECTOMY EXTRAFORAMINAL LUMBAR THREE - LUMBAR FOUR RIGHT;  Surgeon: Earnie Larsson, MD;  Location: [place];  Service: Neurosurgery;  Laterality: Right;  MICRODISCECTOMY EXTRAFORAMINAL LUMBAR THREE - LUMBAR FOUR RIGHT  . MULTIPLE TOOTH EXTRACTIONS    . ORIF MANDIBULAR FRACTURE N/A 12/19/2015   Procedure: OPEN REDUCTION INTERNAL FIXATION (ORIF) MANDIBULAR FRACTURE;  Surgeon: Izora Gala, MD;  Location: [place];  Service: ENT;  Laterality: N/A;  . PATELLA FRACTURE SURGERY Left 1976   plate to knee cap from accident  . POLYPECTOMY  07/05/2017   Procedure: POLYPECTOMY;  Surgeon: Danie Binder, MD;  [facility];  Service: Endoscopy;;  colon  . SAVORY DILATION  12/28/2010   SLF:(MAC)J-shaped stomach/nodular mocosa in the distal esophagus/empiric dilation 37m  . SAVORY DILATION N/A 03/02/2016   Procedure: SAVORY DILATION;  Surgeon: SDanie Binder MD;  [facility];  Service: Endoscopy;  Laterality: N/A;    Current Outpatient Medications  Medication Sig Dispense Refill  .  albuterol (PROVENTIL HFA;VENTOLIN HFA) 108 (90 Base) MCG/ACT inhaler Inhale 2 puffs into the lungs every 6 (six) hours as needed for wheezing or shortness of breath.     [name] Kitchen albuterol (PROVENTIL) (2.5 MG/3ML) 0.083% nebulizer solution Take 2.5 mg by nebulization 2 (two) times daily.    [name] Kitchen ALPRAZolam (XANAX) 1 MG tablet Take 1 mg by mouth 3 (three) times daily.     [name] Kitchen amLODipine (NORVASC) 5 MG tablet Take 1 tablet (5 mg total) by mouth daily. 90 tablet 1  . aspirin-sod bicarb-citric acid (ALKA-SELTZER) 325 MG TBEF tablet Take 650 mg by mouth every 6 (six) hours as needed (immune support).    [name] Kitchen atorvastatin (LIPITOR) 80 MG tablet Take 1 tablet (80 mg total) by mouth daily. 90 tablet 3  . beclomethasone (QVAR) 80 MCG/ACT inhaler Inhale 2 puffs into the lungs 2 (two) times daily as needed (respiratory issues.).     [name] Kitchen BELSOMRA 15 MG TABS Take 1  tablet by mouth at bedtime.    . cetirizine (ZYRTEC) 10 MG tablet Take 10 mg by mouth daily as needed for allergies.   0  . clopidogrel (PLAVIX) 75 MG tablet Take 75 mg by mouth daily.    [name] Kitchen dexlansoprazole (DEXILANT) 60 MG capsule Take 1 capsule (60 mg total) by mouth daily. 30 capsule 5  . Evolocumab (REPATHA SURECLICK) 569 MG/ML SOAJ Inject 140 mg into the skin every 14 (fourteen) days. 2 pen 11  . ezetimibe (ZETIA) 10 MG tablet Take 10 mg by mouth daily.    . fluticasone (FLONASE) 50 MCG/ACT nasal spray Place 2 sprays into both nostrils daily.     . Fluticasone Furoate (ARNUITY ELLIPTA) 200 MCG/ACT AEPB Inhale 1 puff into the lungs daily.    . furosemide (LASIX) 40 MG tablet TAKE 1 TABLET(40 MG) BY MOUTH DAILY 90 tablet 1  . glipiZIDE (GLUCOTROL) 10 MG tablet Take 10 mg by mouth daily before breakfast.    . hydrochlorothiazide (HYDRODIURIL) 25 MG tablet Take 25 mg by mouth daily.    [name] Kitchen ipratropium (ATROVENT) 0.02 % nebulizer solution Take 0.5 mg by nebulization 2 (two) times daily as needed (for congestion).    . isosorbide mononitrate (IMDUR) 30 MG 24 hr tablet Take 0.5 tablets (15 mg total) by mouth at bedtime. 45 tablet 3  . LINZESS 290 MCG CAPS capsule TAKE 1 CAPSULE(290 MCG) BY MOUTH DAILY BEFORE BREAKFAST (Patient taking differently: Occasionally takes 2-3 capsules at a time) 30 capsule 5  . lisinopril (PRINIVIL,ZESTRIL) 10 MG tablet Take 10 mg by mouth daily.    . Liver Extract (LIVER PO) Take 1 tablet by mouth daily. "Liver Aid"    . magnesium citrate SOLN Take 1 Bottle by mouth as needed for severe constipation.    . metFORMIN (GLUCOPHAGE-XR) 500 MG 24 hr tablet Take 1,000 mg by mouth daily with breakfast.    . metoprolol tartrate (LOPRESSOR) 25 MG tablet Take 0.5 tablets (12.5 mg total) by mouth 2 (two) times daily. 30 tablet 0  . Multiple Vitamin (MULTIVITAMIN WITH MINERALS) TABS tablet Take 1 tablet by mouth daily.    . Multiple Vitamins-Minerals (EMERGEN-C IMMUNE) PACK Take 1  packet by mouth daily.    [name] Kitchen NARCAN 4 MG/0.1ML LIQD nasal spray kit Place 1 spray into the nose as needed (opioid overdose).   0  . nitroGLYCERIN (NITROSTAT) 0.4 MG SL tablet SEE NOTES 100 tablet 3  . oxyCODONE-acetaminophen (PERCOCET) 10-325 MG tablet Take 1 tablet by mouth every 4 (four) hours as needed for pain.    [name] Kitchen  potassium chloride SA (KLOR-CON) 20 MEQ tablet Take 20 mEq by mouth 2 (two) times daily.    . Pseudoeph-Doxylamine-DM-APAP (NYQUIL PO) Take 1 Dose by mouth at bedtime as needed (cough).    . Pseudoephedrine-APAP-DM (DAYQUIL PO) Take 1 Dose by mouth daily as needed (cough).    . sertraline (ZOLOFT) 100 MG tablet Take 100 mg by mouth daily.    . sitaGLIPtin (JANUVIA) 100 MG tablet Take 100 mg by mouth daily.    . tamsulosin (FLOMAX) 0.4 MG CAPS capsule Take 1 capsule (0.4 mg total) by mouth daily after breakfast. 30 capsule 0  . Vitamin D, Ergocalciferol, (DRISDOL) 1.25 MG (50000 UT) CAPS capsule Take 50,000 Units by mouth every 7 (seven) days.    . naloxegol oxalate (MOVANTIK) 25 MG TABS tablet Take 1 tablet (25 mg total) by mouth daily. (Patient not taking: Reported on 08/21/2019) 30 tablet 3   No current facility-administered medications for this visit.    Allergies as of 08/21/2019 - Review Complete 08/21/2019  Allergen Reaction Noted  . Gabapentin Hives   . Ibuprofen Other (See Comments)   . Zolpidem tartrate Other (See Comments)   . Naproxen Other (See Comments)     Family History  Problem Relation Age of Onset  . Diabetes Mother   . Hypertension Mother   . Heart attack Mother 49  . Hypertension Father   . Diabetes Father   . Heart attack Father 27  . Heart attack Other        mother, father, brother, sister all deceased due to MI  . Heart attack Sister   . Heart attack Brother   . Seizures Brother   . Heart failure Other   . Colon cancer Neg Hx   . Liver disease Neg Hx   . Anesthesia problems Neg Hx   . Hypotension Neg Hx   . Malignant hyperthermia Neg Hx    . Pseudochol deficiency Neg Hx   . Colon polyps Neg Hx     Social History   Socioeconomic History  . Marital status: Single    Spouse name: Not on file  . Number of children: 2  . Years of education: Not on file  . Highest education level: Not on file  Occupational History  . Occupation: disabled    Fish farm manager: NOT EMPLOYED  Tobacco Use  . Smoking status: Never Smoker  . Smokeless tobacco: Never Used  Vaping Use  . Vaping Use: Never used  Substance and Sexual Activity  . Alcohol use: No    Alcohol/week: 0.0 standard drinks  . 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 Sexual activity: Not Currently  Other Topics Concern  . Not on file  Social History Narrative   Lives w/ son-23/22   Social Determinants of Health   Financial Resource Strain:   . Difficulty of Paying Living Expenses:   Food Insecurity:   . Worried About Charity fundraiser in the Last Year:   . Arboriculturist in the Last Year:   Transportation Needs:   . Film/video editor (Medical):   [name] Kitchen Lack of Transportation (Non-Medical):   Physical Activity:   . Days of Exercise per Week:   . Minutes of Exercise per Session:   Stress:   . Feeling of Stress :   Social Connections:   . Frequency of Communication with Friends and Family:   . Frequency of  Social Gatherings with Friends and Family:   . Attends Religious Services:   . Active Member of Clubs or Organizations:   . Attends Archivist Meetings:   [name] Kitchen Marital Status:     Subjective: Review of Systems  Constitutional: Negative for chills, fever, malaise/fatigue and weight loss.  HENT: Negative for congestion and sore throat.   Respiratory: Negative for cough and shortness of breath.   Cardiovascular: Negative for chest pain and palpitations.  Gastrointestinal: Positive for abdominal pain and constipation. Negative for blood in stool, diarrhea, melena, nausea and  vomiting.  Musculoskeletal: Negative for joint pain and myalgias.  Skin: Negative for rash.  Neurological: Negative for dizziness and weakness.  Endo/Heme/Allergies: Does not bruise/bleed easily.  Psychiatric/Behavioral: Negative for depression. The patient is not nervous/anxious.   All other systems reviewed and are negative.    Objective: BP 129/79   Pulse 70   Temp 98.1 F (36.7 C) (Oral)   Ht _0  (1.778 m)   Wt 283 lb 12.8 oz (128.7 kg)   BMI 40.72 kg/m  Physical Exam Vitals and nursing note reviewed.  Constitutional:      General: He is not in acute distress.    Appearance: Normal appearance. He is well-developed. He is obese. He is not ill-appearing, toxic-appearing or diaphoretic.  HENT:     Head: Normocephalic and atraumatic.     Nose: No congestion or rhinorrhea.  Eyes:     General: No scleral icterus. Cardiovascular:     Rate and Rhythm: Normal rate and regular rhythm.     Heart sounds: Normal heart sounds.  Pulmonary:     Effort: Pulmonary effort is normal.     Breath sounds: Normal breath sounds.  Abdominal:     General: Abdomen is protuberant. Bowel sounds are normal. There is no distension.     Palpations: Abdomen is soft. There is no hepatomegaly, splenomegaly or mass.     Tenderness: There is no abdominal tenderness. There is no guarding or rebound.     Hernia: No hernia is present.  Musculoskeletal:     Cervical back: Neck supple.     Right lower leg: 1+ Edema present.     Left lower leg: 1+ Edema present.  Skin:    General: Skin is warm and dry.     Coloration: Skin is not jaundiced.     Findings: No bruising or rash.  Neurological:     General: No focal deficit present.     Mental Status: He is alert and oriented to person, place, and time. Mental status is at baseline.  Psychiatric:        Mood and Affect: Mood normal.        Behavior: Behavior normal.        Thought Content: Thought content normal.       08/21/2019 9:49  AM   Disclaimer: This note was dictated with voice recognition software. Similar sounding words can inadvertently be transcribed and may not be corrected upon review.

## 2019-08-22 ENCOUNTER — Telehealth: Payer: Self-pay | Admitting: Emergency Medicine

## 2019-08-22 ENCOUNTER — Telehealth: Payer: Self-pay | Admitting: Internal Medicine

## 2019-08-22 NOTE — Telephone Encounter (Addendum)
PA completed and denied for Dalton [name] (Key: [id number]) Rx #: [id number] [place] 25MG  tablets Appeal filed. May take 7 business days  Faxed to [phone] Notified pt

## 2019-08-22 NOTE — Telephone Encounter (Signed)
Please see previous note. Called pt he appeal has been filled

## 2019-08-22 NOTE — Telephone Encounter (Signed)
Pt said his insurance has denied his movantik again. Please advise. [phone]

## 2019-08-22 NOTE — Telephone Encounter (Signed)
Please see previous note about PA

## 2019-08-23 ENCOUNTER — Telehealth: Payer: Self-pay | Admitting: Emergency Medicine

## 2019-08-23 NOTE — Telephone Encounter (Signed)
Insurance called the office and verbally notified me that  Dyson Sevey (Key: [id number]) Rx #: [id number] [place] 25MG  tablets Appeal was approved today  Notified pt and provider

## 2019-08-23 NOTE — Telephone Encounter (Signed)
Excellent. Thanks!

## 2019-08-24 ENCOUNTER — Ambulatory Visit
Admission: RE | Admit: 2019-08-24 | Discharge: 2019-08-24 | Disposition: A | Discharge status: Home / Self Care | Payer: Medicare Other | Source: Ambulatory Visit | Attending: Otolaryngology | Admitting: Otolaryngology

## 2019-08-24 DIAGNOSIS — K219 Gastro-esophageal reflux disease without esophagitis: Secondary | ICD-10-CM

## 2019-08-24 DIAGNOSIS — R1319 Other dysphagia: Secondary | ICD-10-CM

## 2019-08-29 ENCOUNTER — Encounter: Payer: Self-pay | Admitting: Otolaryngology

## 2019-08-29 ENCOUNTER — Telehealth: Payer: Self-pay

## 2019-08-29 NOTE — Telephone Encounter (Signed)
Opened in error

## 2019-08-29 NOTE — Telephone Encounter (Signed)
Pt saw the ENT on 08/21/19 and pt had imaging(swallowing test 08/24/19) at [facility]. Pt says he was told that he may have to have his esophagus stretched. Pt has an apt with our office 10/21. Pt wants to come in sooner to discuss possible stretching his esophagus. EG please advise how soon pt needs to be seen. pts last ov in office was 08/21/19.

## 2019-08-30 NOTE — Telephone Encounter (Signed)
Noted. Please place on cancellation list.

## 2019-08-30 NOTE — Telephone Encounter (Signed)
Please place on the cancellation list. Ok to bump up OV to schedule EGD +/- dilation (he didn't bring up dysphagia at last OV; will need propofol/MAC)

## 2019-09-18 ENCOUNTER — Encounter: Payer: Self-pay | Admitting: *Deleted

## 2019-09-18 ENCOUNTER — Other Ambulatory Visit: Payer: Self-pay

## 2019-09-18 ENCOUNTER — Encounter: Payer: Self-pay | Admitting: Nurse Practitioner

## 2019-09-18 ENCOUNTER — Ambulatory Visit (INDEPENDENT_AMBULATORY_CARE_PROVIDER_SITE_OTHER): Payer: Medicare Other | Admitting: Nurse Practitioner

## 2019-09-18 VITALS — BP 146/105 | HR 76 | Temp 97.4°F | Ht 70.0 in | Wt 276.6 lb

## 2019-09-18 DIAGNOSIS — R1310 Dysphagia, unspecified: Secondary | ICD-10-CM | POA: Diagnosis not present

## 2019-09-18 DIAGNOSIS — K219 Gastro-esophageal reflux disease without esophagitis: Secondary | ICD-10-CM | POA: Diagnosis not present

## 2019-09-18 DIAGNOSIS — R1319 Other dysphagia: Secondary | ICD-10-CM

## 2019-09-18 NOTE — Assessment & Plan Note (Signed)
The patient is complaining of progressively worsening dysphagia.  He did not mention it at his last office visit because he states he "tries to just deal with it."  Last EGD completed 03/02/2016 on propofol/MAC.  Findings included normal-appearing esophagus status post dilation with dysphagia most likely due to uncontrolled reflux or esophageal motility disorder.  At this point we will proceed with EGD with possible dilation.  If he fails dilation and still has progressive symptoms after his EGD can consider referral to [facility] as previously recommended  Proceed with EGD +/- dilation with Dr. Abbey Chatters on propofol/MAC in near future: the risks, benefits, and alternatives have been discussed with the patient in detail. The patient states understanding and desires to proceed.  The patient is currently on Januvia and glipizide which we will have him hold the morning of his procedure.  ASA III (COPD)

## 2019-09-18 NOTE — Assessment & Plan Note (Signed)
GERD currently doing well on PPI.  Recommend he continue his current PPI and follow-up in 6 months

## 2019-09-18 NOTE — Patient Instructions (Signed)
Your health issues we discussed today were:   Dysphagia (swallowing difficulties) with known GERD (reflux/heartburn): 1. Continue taking your acid blocker as you have been 2. We will schedule an upper endoscopy with possible dilation with you 3. Further recommendations will follow 4. Call us if you have any worsening or severe symptoms 5. If you have swallowing difficulties with food gets stuck and will not go forward or backward and lasts more than 2 to 3 hours then proceed to the emergency room  Overall I recommend:  1. Continue other current medications 2. Follow-up in 6 months 3. Call us for any questions or concerns   ---------------------------------------------------------------  I am glad you have gotten your COVID-19 vaccination!  Even though you are fully vaccinated you should continue to follow CDC and state/local guidelines.  ---------------------------------------------------------------   At [facility] we value your feedback. You may receive a survey about your visit today. Please share your experience as we strive to create trusting relationships with our patients to provide genuine, compassionate, quality care.  We appreciate your understanding and patience as we review any laboratory studies, imaging, and other diagnostic tests that are ordered as we care for you. Our office policy is 5 business days for review of these results, and any emergent or urgent results are addressed in a timely manner for your best interest. If you do not hear from our office in 1 week, please contact us.   We also encourage the use of MyChart, which contains your medical information for your review as well. If you are not enrolled in this feature, an access code is on this after visit summary for your convenience. Thank you for allowing [place] to be involved in your care.  It was great to see you today!  I hope you have a great Summer!!

## 2019-09-18 NOTE — Progress Notes (Signed)
Referring Provider: Nolene Ebbs, MD Primary Care Physician:  Nolene Ebbs, MD Primary GI:  Dr. Gala Romney (in the absence of Dr. Oneida Alar); pending Dr. Abbey Chatters  Chief Complaint  Patient presents with  . Dysphagia    sometimes gets up mucous; food/pills get stuck    HPI:   Dalton [name] is a 59 y.o. male who presents for dysphagia.  The patient was last seen in our office 08/21/2019 for lower abdominal pain, constipation, OIC.  History of tubular adenoma on colonoscopy next due in 2022.  Previous trial and failure of Amitiza and magnesium citrate, Linzess not effective even at 290 dose, Movantik required trial of Relistor by insurance but Relistor would not be paid for by insurance.  Eventually settled on continued Linzess 290 mcg and Colace over-the-counter.  Eventually Movantik was covered.  It worked well initially but then needed to add Linzess and mag citrate occasionally.  Has more GERD symptoms when constipated.  At his last visit still with constipation and abdominal discomfort.  At some point in the past 6 months he stopped Movantik and now only on Linzess 290 mcg although will take 2-3 at a time.  Uses mag citrate as well.  He states he called the drugstore and they told him they did not have a prescription for Movantik, but he did not call our office.  Notes that Movantik did work well for him when he was on it.  Still with abdominal discomfort associated with constipation but improved somewhat after a bowel movement.  No other overt GI complaints.  Recommended restart Movantik (refill sent to the pharmacy) and give it 2 to 4 weeks to become effective.  Can add Linzess if no improvement on Movantik after a few weeks.  Follow-up in 4 months.  A prior authorization was again sent to the insurance company and denied.  Appeal was filed.  On appeal his Movantik was approved for 25 mg daily.  Phone note from patient 08/29/2019 resolved ENT and had imaging completed and was told he may need  to have his esophagus stretched.  He was placed on the cancellation list and scheduled for today.  Today he states he is doing okay overall. Has had dysphagia symptoms for months, didn't mention it last visit "because I try to just deal with it." Has solid food dysphagia with every meal. GERD doing pretty good on PPI. Denies abdominal pain, N/V, hematochezia, melena, fever, chills, unintentional weight loss. Is eating softer foods (tho mostly using liquid-based foods) and chewing adequately. Denies URI or flu-like symptoms. Denies loss of sense of taste or smell. The patient has received COVID-19 vaccination(s). Denies chest pain, dyspnea, dizziness, lightheadedness, syncope, near syncope. Denies any other upper or lower GI symptoms.  Past Medical History:  Diagnosis Date  . Anemia   . Anxiety   . Asthma   . Bipolar disorder ([place])   . CHF (congestive heart failure) ([place])   . Chronic back pain    Pain Clinic in [place]  . Chronic bronchitis   . Chronic lower back pain   . Congestive heart failure (CHF) ([place])   . COPD (chronic obstructive pulmonary disease) ([place])    per patient 03/05/19  . Coronary artery disease   . Coughing   . Depression   . DVT (deep venous thrombosis) ([place]) ~ 2005   LLE  . Frequency of urination   . GERD (gastroesophageal reflux disease)   . 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 SFKCLEXN(170.0)    "a few times/week" (12/24/2015)  . High cholesterol   . HNP (herniated nucleus pulposus), cervical   . Hypertension   . Laceration of right hand 11/27/2010  . Laceration of wrist 2007 BIL FOREARMS  . MI (myocardial infarction) ([place])    7, last one was in 2011 (12/24/2015)  . Neuromuscular disorder ([facility])    legs/feet  . Neuropathy    legs/feet  . NSAID-induced gastric ulcer    "Ibuprofen"  . PUD (peptic ulcer disease)    in 1990s, secondary to medication  . Rheumatoid arthritis ([place])    "all over"  (12/24/2015)  . Shortness of breath    with exertion  . Tonsillitis, chronic    Dr. Vicki Mallet in [place]  . Type II diabetes mellitus ([place])   . Wears glasses     Past Surgical History:  Procedure Laterality Date  . ANTERIOR CERVICAL DECOMP/DISCECTOMY FUSION N/A 11/05/2016   Procedure: Anterior Cervical Discectomy and Fusion - Cervical three-Cervical four;  Surgeon: Earnie Larsson, MD;  Location: [place];  Service: Neurosurgery;  Laterality: N/A;  . BACK SURGERY     x3  . BIOPSY N/A 05/30/2012   Procedure: BIOPSY;  Surgeon: Danie Binder, MD;  [facility];  Service: Endoscopy;  Laterality: N/A;  . BIOPSY  03/02/2016   Procedure: BIOPSY;  Surgeon: Danie Binder, MD;  [facility];  Service: Endoscopy;;  gastric  . CARDIAC CATHETERIZATION  "several"  . CARPAL TUNNEL RELEASE Bilateral   . CATARACT EXTRACTION W/PHACO Right 06/15/2016   Procedure: CATARACT EXTRACTION PHACO AND INTRAOCULAR LENS PLACEMENT (IOC);  Surgeon: Rutherford Guys, MD;  Location: [facility];  Service: Ophthalmology;  Laterality: Right;  CDE: 4.64  . CATARACT EXTRACTION W/PHACO Left 07/13/2016   Procedure: CATARACT EXTRACTION PHACO AND INTRAOCULAR LENS PLACEMENT (IOC);  Surgeon: Rutherford Guys, MD;  Location: [facility];  Service: Ophthalmology;  Laterality: Left;  CDE: 3.15  . COLONOSCOPY  12/28/2010   SLF: (MAC)Internal hemorrhoids/four small colon polyps tubular adenomas. per SLF: colonoscopy 2022  . COLONOSCOPY WITH PROPOFOL N/A 07/05/2017   Procedure: COLONOSCOPY WITH PROPOFOL;  Surgeon: Danie Binder, MD;  [facility];  Service: Endoscopy;  Laterality: N/A;  7:30am  . CORONARY ARTERY BYPASS GRAFT  2002   3 vessels  . ESOPHAGOGASTRODUODENOSCOPY N/A 05/30/2012   SLF: UNCONTROLLED GERD DUE TO LIFESTYLE CHOICE/WEIGHT GAIN/MILD Non-erosive gastritis  . ESOPHAGOGASTRODUODENOSCOPY (EGD) WITH PROPOFOL N/A 03/02/2016   Dr. Oneida Alar: Esophagus appeared normal, impaired dilation performed, patchy inflammation with  edema and erythema of the entire stomach., Biopsy with H pylori, patient completed Pylera.   [name] Kitchen FRACTURE SURGERY    . LEFT HEART CATHETERIZATION WITH CORONARY ANGIOGRAM N/A 08/31/2011   Procedure: LEFT HEART CATHETERIZATION WITH CORONARY ANGIOGRAM;  Surgeon: Laverda Page, MD;  Location[facility];  Service: Cardiovascular;  Laterality: N/A;  . LUMBAR DISC SURGERY     "L4-5; Dr. Trenton Gammon"  . LUMBAR LAMINECTOMY/DECOMPRESSION MICRODISCECTOMY Right 03/06/2019   Procedure: MICRODISCECTOMY EXTRAFORAMINAL LUMBAR THREE - LUMBAR FOUR RIGHT;  Surgeon: Earnie Larsson, MD;  Location: [facility];  Service: Neurosurgery;  Laterality: Right;  MICRODISCECTOMY EXTRAFORAMINAL LUMBAR THREE - LUMBAR FOUR RIGHT  . MULTIPLE TOOTH EXTRACTIONS    . ORIF MANDIBULAR FRACTURE N/A 12/19/2015   Procedure: OPEN REDUCTION INTERNAL FIXATION (ORIF) MANDIBULAR FRACTURE;  Surgeon: Izora Gala, MD;  Location: [place];  Service: ENT;  Laterality: N/A;  . PATELLA FRACTURE SURGERY Left 1976   plate  to knee cap from accident  . POLYPECTOMY  07/05/2017   Procedure: POLYPECTOMY;  Surgeon: Danie Binder, MD;  [facility];  Service: Endoscopy;;  colon  . SAVORY DILATION  12/28/2010   SLF:(MAC)J-shaped stomach/nodular mocosa in the distal esophagus/empiric dilation 15m  . SAVORY DILATION N/A 03/02/2016   Procedure: SAVORY DILATION;  Surgeon: SDanie Binder MD;  [facility];  Service: Endoscopy;  Laterality: N/A;    Current Outpatient Medications  Medication Sig Dispense Refill  . albuterol (PROVENTIL HFA;VENTOLIN HFA) 108 (90 Base) MCG/ACT inhaler Inhale 2 puffs into the lungs every 6 (six) hours as needed for wheezing or shortness of breath.     .[name] Kitchenalbuterol (PROVENTIL) (2.5 MG/3ML) 0.083% nebulizer solution Take 2.5 mg by nebulization 2 (two) times daily.    .[name] KitchenALPRAZolam (XANAX) 1 MG tablet Take 1 mg by mouth 3 (three) times daily.     .[name] KitchenamLODipine (NORVASC) 5 MG tablet Take 1 tablet (5 mg total) by mouth daily. 90  tablet 1  . aspirin-sod bicarb-citric acid (ALKA-SELTZER) 325 MG TBEF tablet Take 650 mg by mouth every 6 (six) hours as needed (immune support).    .[name] Kitchenatorvastatin (LIPITOR) 80 MG tablet Take 1 tablet (80 mg total) by mouth daily. 90 tablet 3  . beclomethasone (QVAR) 80 MCG/ACT inhaler Inhale 2 puffs into the lungs 2 (two) times daily as needed (respiratory issues.).     .[name] KitchenBELSOMRA 15 MG TABS Take 1 tablet by mouth at bedtime.    . cetirizine (ZYRTEC) 10 MG tablet Take 10 mg by mouth daily as needed for allergies.   0  . clopidogrel (PLAVIX) 75 MG tablet Take 75 mg by mouth daily.    .[name] Kitchendexlansoprazole (DEXILANT) 60 MG capsule Take 1 capsule (60 mg total) by mouth daily. 30 capsule 5  . Evolocumab (REPATHA SURECLICK) 1272MG/ML SOAJ Inject 140 mg into the skin every 14 (fourteen) days. 2 pen 11  . ezetimibe (ZETIA) 10 MG tablet Take 10 mg by mouth daily.    . fluticasone (FLONASE) 50 MCG/ACT nasal spray Place 2 sprays into both nostrils daily.     . Fluticasone Furoate (ARNUITY ELLIPTA) 200 MCG/ACT AEPB Inhale 1 puff into the lungs daily.    . furosemide (LASIX) 40 MG tablet TAKE 1 TABLET(40 MG) BY MOUTH DAILY 90 tablet 1  . glipiZIDE (GLUCOTROL) 10 MG tablet Take 10 mg by mouth daily before breakfast.    . hydrochlorothiazide (HYDRODIURIL) 25 MG tablet Take 25 mg by mouth daily.    .[name] Kitchenipratropium (ATROVENT) 0.02 % nebulizer solution Take 0.5 mg by nebulization 2 (two) times daily as needed (for congestion).    . isosorbide mononitrate (IMDUR) 30 MG 24 hr tablet Take 0.5 tablets (15 mg total) by mouth at bedtime. 45 tablet 3  . LINZESS 290 MCG CAPS capsule TAKE 1 CAPSULE(290 MCG) BY MOUTH DAILY BEFORE BREAKFAST 30 capsule 5  . lisinopril (PRINIVIL,ZESTRIL) 10 MG tablet Take 10 mg by mouth daily.    . Liver Extract (LIVER PO) Take 1 tablet by mouth daily. "Liver Aid"    . magnesium citrate SOLN Take 1 Bottle by mouth as needed for severe constipation.    . metFORMIN (GLUCOPHAGE-XR) 500 MG 24 hr  tablet Take 1,000 mg by mouth daily with breakfast.    . metoprolol tartrate (LOPRESSOR) 25 MG tablet Take 0.5 tablets (12.5 mg total) by mouth 2 (two) times daily. 30 tablet 0  . Multiple Vitamin (MULTIVITAMIN WITH MINERALS) TABS tablet  Take 1 tablet by mouth daily.    . Multiple Vitamins-Minerals (EMERGEN-C IMMUNE) PACK Take 1 packet by mouth daily.    . naloxegol oxalate (MOVANTIK) 25 MG TABS tablet Take 1 tablet (25 mg total) by mouth daily. 30 tablet 5  . NARCAN 4 MG/0.1ML LIQD nasal spray kit Place 1 spray into the nose as needed (opioid overdose).   0  . nitroGLYCERIN (NITROSTAT) 0.4 MG SL tablet SEE NOTES 100 tablet 3  . oxyCODONE-acetaminophen (PERCOCET) 10-325 MG tablet Take 1 tablet by mouth every 4 (four) hours as needed for pain.    . potassium chloride SA (KLOR-CON) 20 MEQ tablet Take 20 mEq by mouth 2 (two) times daily.    . Pseudoeph-Doxylamine-DM-APAP (NYQUIL PO) Take 1 Dose by mouth at bedtime as needed (cough).    . Pseudoephedrine-APAP-DM (DAYQUIL PO) Take 1 Dose by mouth daily as needed (cough).    . sertraline (ZOLOFT) 100 MG tablet Take 100 mg by mouth daily.    . sitaGLIPtin (JANUVIA) 100 MG tablet Take 100 mg by mouth daily.    . tamsulosin (FLOMAX) 0.4 MG CAPS capsule Take 1 capsule (0.4 mg total) by mouth daily after breakfast. 30 capsule 0  . Vitamin D, Ergocalciferol, (DRISDOL) 1.25 MG (50000 UT) CAPS capsule Take 50,000 Units by mouth every 7 (seven) days.     No current facility-administered medications for this visit.    Allergies as of 09/18/2019 - Review Complete 09/18/2019  Allergen Reaction Noted  . Gabapentin Hives   . Ibuprofen Other (See Comments)   . Zolpidem tartrate Other (See Comments)   . Naproxen Other (See Comments)     Family History  Problem Relation Age of Onset  . Diabetes Mother   . Hypertension Mother   . Heart attack Mother 49  . Hypertension Father   . Diabetes Father   . Heart attack Father 1  . Heart attack Other         mother, father, brother, sister all deceased due to MI  . Heart attack Sister   . Heart attack Brother   . Seizures Brother   . Heart failure Other   . Colon cancer Neg Hx   . Liver disease Neg Hx   . Anesthesia problems Neg Hx   . Hypotension Neg Hx   . Malignant hyperthermia Neg Hx   . Pseudochol deficiency Neg Hx   . Colon polyps Neg Hx     Social History   Socioeconomic History  . Marital status: Single    Spouse name: Not on file  . Number of children: 2  . Years of education: Not on file  . Highest education level: Not on file  Occupational History  . Occupation: disabled    Fish farm manager: NOT EMPLOYED  Tobacco Use  . Smoking status: Never Smoker  . Smokeless tobacco: Never Used  Vaping Use  . Vaping Use: Never used  Substance and Sexual Activity  . Alcohol use: No    Alcohol/week: 0.0 standard drinks  . 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 Sexual activity: Not Currently  Other Topics Concern  . Not on file  Social History Narrative   Lives w/ son-23/22   Social Determinants of Health   Financial Resource Strain:   . Difficulty of Paying Living Expenses:   Food Insecurity:   . Worried About Charity fundraiser in the Last Year:   .  Ran Out of Food in the Last Year:   Transportation Needs:   . Film/video editor (Medical):   [name] Kitchen Lack of Transportation (Non-Medical):   Physical Activity:   . Days of Exercise per Week:   . Minutes of Exercise per Session:   Stress:   . Feeling of Stress :   Social Connections:   . Frequency of Communication with Friends and Family:   . Frequency of Social Gatherings with Friends and Family:   . Attends Religious Services:   . Active Member of Clubs or Organizations:   . Attends Archivist Meetings:   [name] Kitchen Marital Status:     Subjective: Review of Systems  Constitutional: Negative for chills, fever, malaise/fatigue and weight loss.   HENT: Negative for congestion and sore throat.   Respiratory: Negative for cough and shortness of breath.   Cardiovascular: Negative for chest pain and palpitations.  Gastrointestinal: Negative for abdominal pain, blood in stool, constipation, diarrhea, heartburn, melena, nausea and vomiting.       Dysphagia  Musculoskeletal: Negative for joint pain and myalgias.  Skin: Negative for rash.  Neurological: Negative for dizziness and weakness.  Endo/Heme/Allergies: Does not bruise/bleed easily.  Psychiatric/Behavioral: Negative for depression. The patient is not nervous/anxious.   All other systems reviewed and are negative.    Objective: BP (!) 146/105   Pulse 76   Temp (!) 97.4 F (36.3 C) (Oral)   Ht _0  (1.778 m)   Wt (!) 276 lb 9.6 oz (125.5 kg)   BMI 39.69 kg/m  Physical Exam Vitals and nursing note reviewed.  Constitutional:      General: He is not in acute distress.    Appearance: Normal appearance. He is obese. He is not ill-appearing, toxic-appearing or diaphoretic.  HENT:     Head: Normocephalic and atraumatic.     Nose: No congestion or rhinorrhea.  Eyes:     General: No scleral icterus. Cardiovascular:     Rate and Rhythm: Normal rate and regular rhythm.     Heart sounds: Normal heart sounds.  Pulmonary:     Effort: Pulmonary effort is normal.     Breath sounds: Normal breath sounds.  Abdominal:     General: Bowel sounds are normal. There is no distension.     Palpations: Abdomen is soft. There is no hepatomegaly, splenomegaly or mass.     Tenderness: There is no abdominal tenderness. There is no guarding or rebound.     Hernia: No hernia is present.  Musculoskeletal:     Cervical back: Neck supple.  Skin:    General: Skin is warm and dry.     Coloration: Skin is not jaundiced.     Findings: No bruising or rash.  Neurological:     General: No focal deficit present.     Mental Status: He is alert and oriented to person, place, and time. Mental status  is at baseline.  Psychiatric:        Mood and Affect: Mood normal.        Behavior: Behavior normal.        Thought Content: Thought content normal.       09/18/2019 10:10 AM   Disclaimer: This note was dictated with voice recognition software. Similar sounding words can inadvertently be transcribed and may not be corrected upon review.

## 2019-09-18 NOTE — Progress Notes (Signed)
Cardiology Office Note:    Date:  09/19/2019   ID:  Dalton [name], DOB 1960-06-10, MRN [id number]  PCP:  Nolene Ebbs, MD  Cardiologist:  Carlyle Dolly, MD   Referring MD: Nolene Ebbs, MD   Chief Complaint  Patient presents with   Coronary Artery Disease    History of Present Illness:    Dalton [name] is a 59 y.o. male with a hx of CAD with prior single-vessel CABG2002(LIMA to LAD) with prior cardiac catheterization showing atretic LIMA with normal native coronary arteries,multiple prior MIsperhaps in the setting of cocaine use,ischemiccardiomyopathy(last EF greater than 50%), HTN,HLD,type 2 diabetes mellitus,left subclavian stent,rheumatoid arthritis, DVT, peptic ulcer disease, bipolar disorder, and past cocaine use.   F/U after adding low dose Imdur.  The patient complains of multiple varieties of chest discomfort.  After reviewing his records it is unclear to me if he is having angina or other noncardiac related symptoms is musculoskeletal or GI.  Discomfort can occur at rest.  Discomfort is not predictably precipitated by activity.  Response to nitroglycerin is questionable.  He has a very difficult time specifically expressing the quality and location of his discomfort.  Review of records suggest that bypass surgery was done in 2002 with LIMA to LAD and when cath was repeated in 2013 by Dr. Einar Gip, the LIMA was atretic and coronary arteries were "normal".  Dr. Irven Shelling cath report summary is noted below.  Reports and all cardiac catheterizations were reviewed back to 2003.  Early angiograms demonstrated a bend in the ostial LAD that I would now characterize as a kink.  The angiographic appearance and intravascular ultrasound (Pulsifer and Downey) suggested disease, which led to LIMA to LAD I believe by Dr. Darylene Price.  Catheterization done thereafter in 2008 demonstrated atretic LIMA to LAD and by 2013 the appearance of the ostial LAD was normal as outlined by  Dr. Einar Gip.  Also, the patient is known to have normal LV function by the most recent echo in 2019.  He does have bipolar disorder which may be impacting symptom complex.  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[place])     last seizure in 2011;unknown etiology-pt sts heriditary (12/24/2015)   NFAOZHYQ(657.8)    "a few times/week" (12/24/2015)   High cholesterol    HNP (herniated nucleus pulposus), cervical    Hypertension    Laceration of right hand 11/27/2010   Laceration of wrist 2007 BIL FOREARMS   MI (myocardial infarction) ([place])    7, last one was in 2011 (12/24/2015)   Neuromuscular disorder ([place])    legs/feet   Neuropathy    legs/feet   NSAID-induced gastric ulcer    "Ibuprofen"   PUD (peptic ulcer disease)    in 1990s, secondary to medication   Rheumatoid arthritis ([place])    "all over" (12/24/2015)  Shortness of breath    with exertion   Tonsillitis, chronic    Dr. Vicki Mallet in [place]   Type II diabetes mellitus [facility])    Wears glasses     Past Surgical History:  Procedure Laterality Date   ANTERIOR CERVICAL DECOMP/DISCECTOMY FUSION N/A 11/05/2016   Procedure: Anterior Cervical Discectomy and Fusion - Cervical three-Cervical four;  Surgeon: Earnie Larsson, MD;  Location: [facility];  Service: Neurosurgery;  Laterality: N/A;   BACK SURGERY     x3   BIOPSY N/A 05/30/2012   Procedure: BIOPSY;  Surgeon: Danie Binder, MD;  [facility];  Service: Endoscopy;  Laterality: N/A;   BIOPSY  03/02/2016   Procedure: BIOPSY;  Surgeon: Danie Binder, MD;  [facility];  Service: Endoscopy;;  gastric   CARDIAC CATHETERIZATION  "several"   CARPAL TUNNEL RELEASE Bilateral    CATARACT EXTRACTION W/PHACO Right 06/15/2016   Procedure: CATARACT EXTRACTION PHACO AND INTRAOCULAR LENS PLACEMENT ([place]);  Surgeon: Rutherford Guys, MD;  Location: [facility] OR[facility];  Service: Ophthalmology;  Laterality: Right;  CDE: 4.64   CATARACT EXTRACTION W/PHACO Left 07/13/2016   Procedure: CATARACT EXTRACTION PHACO AND INTRAOCULAR LENS PLACEMENT ([facility]);  Surgeon: Rutherford Guys, MD;  Location: [facility];  Service: Ophthalmology;  Laterality: Left;  CDE: 3.15   COLONOSCOPY  12/28/2010   SLF: (MAC)Internal hemorrhoids/four small colon polyps tubular adenomas. per SLF: colonoscopy 2022   COLONOSCOPY WITH PROPOFOL N/A 07/05/2017   Procedure: COLONOSCOPY WITH PROPOFOL;  Surgeon: Danie Binder, MD;  [facility];  Service: Endoscopy;  Laterality: N/A;  7:30am   CORONARY ARTERY BYPASS GRAFT  2002   3 vessels   ESOPHAGOGASTRODUODENOSCOPY N/A 05/30/2012   SLF: UNCONTROLLED GERD DUE TO LIFESTYLE CHOICE/WEIGHT GAIN/MILD Non-erosive gastritis   ESOPHAGOGASTRODUODENOSCOPY (EGD) WITH PROPOFOL N/A 03/02/2016   Dr. Oneida Alar: Esophagus appeared normal, impaired dilation performed, patchy inflammation with edema and erythema of the entire stomach., Biopsy with H pylori, patient completed Pylera.    FRACTURE SURGERY     LEFT HEART CATHETERIZATION WITH CORONARY ANGIOGRAM N/A 08/31/2011   Procedure: LEFT HEART CATHETERIZATION WITH CORONARY ANGIOGRAM;  Surgeon: Laverda Page, MD;  Location[facility];  Service: Cardiovascular;  Laterality: N/A;   LUMBAR DISC SURGERY     "L4-5; Dr. Trenton Gammon"   LUMBAR LAMINECTOMY/DECOMPRESSION MICRODISCECTOMY Right 03/06/2019   Procedure: MICRODISCECTOMY EXTRAFORAMINAL LUMBAR THREE - LUMBAR FOUR RIGHT;  Surgeon: Earnie Larsson, MD;   Location: [facility];  Service: Neurosurgery;  Laterality: Right;  MICRODISCECTOMY EXTRAFORAMINAL LUMBAR THREE - LUMBAR FOUR RIGHT   MULTIPLE TOOTH EXTRACTIONS     ORIF MANDIBULAR FRACTURE N/A 12/19/2015   Procedure: OPEN REDUCTION INTERNAL FIXATION (ORIF) MANDIBULAR FRACTURE;  Surgeon: Izora Gala, MD;  Location: [facility];  Service: ENT;  Laterality: N/A;   PATELLA FRACTURE SURGERY Left 1976   plate to knee cap from accident   POLYPECTOMY  07/05/2017   Procedure: POLYPECTOMY;  Surgeon: Danie Binder, MD;  [facility];  Service: Endoscopy;;  colon   SAVORY DILATION  12/28/2010   SLF:(MAC)J-shaped stomach/nodular mocosa in the distal esophagus/empiric dilation 77m   SAVORY DILATION N/A 03/02/2016   Procedure: SAVORY DILATION;  Surgeon: SDanie Binder MD;  [facility];  Service: Endoscopy;  Laterality: N/A;    Current Medications: Current Meds  Medication Sig   albuterol (PROVENTIL HFA;VENTOLIN HFA) 108 (90 Base) MCG/ACT inhaler Inhale 2 puffs into the lungs every 6 (six) hours as needed for wheezing or shortness of breath.  albuterol (PROVENTIL) (2.5 MG/3ML) 0.083% nebulizer solution Take 2.5 mg by nebulization 2 (two) times daily.   ALPRAZolam (XANAX) 1 MG tablet Take 1 mg by mouth 3 (three) times daily.    amLODipine (NORVASC) 5 MG tablet Take 1 tablet (5 mg total) by mouth daily.   aspirin-sod bicarb-citric acid (ALKA-SELTZER) 325 MG TBEF tablet Take 650 mg by mouth every 6 (six) hours as needed (immune support).   atorvastatin (LIPITOR) 80 MG tablet Take 1 tablet (80 mg total) by mouth daily.   beclomethasone (QVAR) 80 MCG/ACT inhaler Inhale 2 puffs into the lungs 2 (two) times daily as needed (respiratory issues.).    BELSOMRA 15 MG TABS Take 1 tablet by mouth at bedtime.   cetirizine (ZYRTEC) 10 MG tablet Take 10 mg by mouth daily as needed for allergies.    clopidogrel (PLAVIX) 75 MG tablet Take 75 mg by mouth daily.   dexlansoprazole (DEXILANT) 60  MG capsule Take 1 capsule (60 mg total) by mouth daily.   Evolocumab (REPATHA SURECLICK) 381 MG/ML SOAJ Inject 140 mg into the skin every 14 (fourteen) days.   ezetimibe (ZETIA) 10 MG tablet Take 10 mg by mouth daily.   fluticasone (FLONASE) 50 MCG/ACT nasal spray Place 2 sprays into both nostrils daily.    Fluticasone Furoate (ARNUITY ELLIPTA) 200 MCG/ACT AEPB Inhale 1 puff into the lungs daily.   furosemide (LASIX) 40 MG tablet TAKE 1 TABLET(40 MG) BY MOUTH DAILY   glipiZIDE (GLUCOTROL) 10 MG tablet Take 10 mg by mouth daily before breakfast.   hydrochlorothiazide (HYDRODIURIL) 25 MG tablet Take 25 mg by mouth daily.   ipratropium (ATROVENT) 0.02 % nebulizer solution Take 0.5 mg by nebulization 2 (two) times daily as needed (for congestion).   isosorbide mononitrate (IMDUR) 30 MG 24 hr tablet Take 0.5 tablets (15 mg total) by mouth at bedtime.   LINZESS 290 MCG CAPS capsule TAKE 1 CAPSULE(290 MCG) BY MOUTH DAILY BEFORE BREAKFAST   lisinopril (PRINIVIL,ZESTRIL) 10 MG tablet Take 10 mg by mouth daily.   Liver Extract (LIVER PO) Take 1 tablet by mouth daily. "Liver Aid"   magnesium citrate SOLN Take 1 Bottle by mouth as needed for severe constipation.   metFORMIN (GLUCOPHAGE-XR) 500 MG 24 hr tablet Take 1,000 mg by mouth daily with breakfast.   metoprolol tartrate (LOPRESSOR) 25 MG tablet Take 0.5 tablets (12.5 mg total) by mouth 2 (two) times daily.   Multiple Vitamin (MULTIVITAMIN WITH MINERALS) TABS tablet Take 1 tablet by mouth daily.   Multiple Vitamins-Minerals (EMERGEN-C IMMUNE) PACK Take 1 packet by mouth daily.   naloxegol oxalate (MOVANTIK) 25 MG TABS tablet Take 1 tablet (25 mg total) by mouth daily.   NARCAN 4 MG/0.1ML LIQD nasal spray kit Place 1 spray into the nose as needed (opioid overdose).    nitroGLYCERIN (NITROSTAT) 0.4 MG SL tablet SEE NOTES   oxyCODONE-acetaminophen (PERCOCET) 10-325 MG tablet Take 1 tablet by mouth every 4 (four) hours as needed for  pain.   potassium chloride SA (KLOR-CON) 20 MEQ tablet Take 20 mEq by mouth 2 (two) times daily.   Pseudoeph-Doxylamine-DM-APAP (NYQUIL PO) Take 1 Dose by mouth at bedtime as needed (cough).   Pseudoephedrine-APAP-DM (DAYQUIL PO) Take 1 Dose by mouth daily as needed (cough).   sertraline (ZOLOFT) 100 MG tablet Take 100 mg by mouth daily.   sitaGLIPtin (JANUVIA) 100 MG tablet Take 100 mg by mouth daily.   tamsulosin (FLOMAX) 0.4 MG CAPS capsule Take 1 capsule (0.4 mg total) by mouth daily after  breakfast.   Vitamin D, Ergocalciferol, (DRISDOL) 1.25 MG (50000 UT) CAPS capsule Take 50,000 Units by mouth every 7 (seven) days.     Allergies:   Gabapentin, Ibuprofen, Zolpidem tartrate, and Naproxen   Social History   Socioeconomic History   Marital status: Single    Spouse name: Not on file   Number of children: 2   Years of education: Not on file   Highest education level: Not on file  Occupational History   Occupation: disabled    Employer: NOT EMPLOYED  Tobacco Use   Smoking status: Never Smoker   Smokeless tobacco: Never Used  Vaping Use   Vaping Use: Never used  Substance and Sexual Activity   Alcohol use: No    Alcohol/week: 0.0 standard drinks   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 (as of 6/29/201)   Sexual activity: Not Currently  Other Topics Concern   Not on file  Social History Narrative   Lives w/ son-23/22   Social Determinants of Health   Financial Resource Strain:    Difficulty of Paying Living Expenses:   Food Insecurity:    Worried About Charity fundraiser in the Last Year:    Arboriculturist in the Last Year:   Transportation Needs:    Film/video editor (Medical):    Lack of Transportation (Non-Medical):   Physical Activity:    Days of Exercise per Week:    Minutes of Exercise per Session:   Stress:    Feeling of Stress :   Social Connections:    Frequency  of Communication with Friends and Family:    Frequency of Social Gatherings with Friends and Family:    Attends Religious Services:    Active Member of Clubs or Organizations:    Attends Music therapist:    Marital Status:      Family History: The patient's family history includes Diabetes in his father and mother; Heart attack in his brother, sister, and another family member; Heart attack (age of onset: 59) in his father; Heart attack (age of onset: 64) in his mother; Heart failure in an other family member; Hypertension in his father and mother; Seizures in his brother. There is no history of Colon cancer, Liver disease, Anesthesia problems, Hypotension, Malignant hyperthermia, Pseudochol deficiency, or Colon polyps.  ROS:   Please see the history of present illness.    Feels that he is not heard by healthcare providers.  Implies that he is suffering through every day without eyes adequately helping him feel better.  All other systems reviewed and are negative.  EKGs/Labs/Other Studies Reviewed:    The following studies were reviewed today:  Cardiac catheterization 2013: IMPRESSIONS:  1. Normal coronary arteries, right dominant circulation. Patient states that he's had coronary stent in the past. I suspect this is probably a subclavian stent. LIMA to LAD is atretic. There is no significant coronary artery disease. LVEF: Normal 2. Patent left subclavian artery stent.  2D Doppler echocardiogram 2019: -------------------------------------------------------------------  Study Conclusions   - Left ventricle: The cavity size was normal. Wall thickness was  increased in a pattern of mild LVH. Systolic function was normal.  The estimated ejection fraction was in the range of 60% to 65%  EKG:  EKG August 14, 2019, reviewed and demonstrates normal sinus rhythm, PACs, PVC, nonspecific T wave flattening.  Recent Labs: 08/14/2019: ALT 34; BUN 6; Creatinine, Ser 0.85;  Hemoglobin 12.9; Platelets 234; Potassium 3.9; Sodium 138  Recent Lipid Panel    Component Value Date/Time   CHOL 131 08/14/2019 1238   TRIG 255 (H) 08/14/2019 1238   HDL 37 (L) 08/14/2019 1238   CHOLHDL 3.5 08/14/2019 1238   CHOLHDL 2.0 12/26/2017 1112   VLDL 69 (H) 12/16/2015 0630   LDLCALC 54 08/14/2019 1238   LDLCALC 10 12/26/2017 1112    Physical Exam:    VS:  BP (!) 132/78    Pulse 80    Ht 5' 10" (1.778 m)    Wt (!) 280 lb (127 kg)    SpO2 94%    BMI 40.18 kg/m     Wt Readings from Last 3 Encounters:  09/19/19 (!) 280 lb (127 kg)  09/18/19 (!) 276 lb 9.6 oz (125.5 kg)  08/21/19 283 lb 12.8 oz (128.7 kg)     GEN: Obese. No acute distress HEENT: Normal NECK: No JVD. LYMPHATICS: No lymphadenopathy CARDIAC:  RRR without murmur, gallop, or edema. VASCULAR:  Normal Pulses. No bruits. RESPIRATORY:  Clear to auscultation without rales, wheezing or rhonchi  ABDOMEN: Soft, non-tender, non-distended, No pulsatile mass, MUSCULOSKELETAL: No deformity  SKIN: Warm and dry NEUROLOGIC:  Alert and oriented x 3 PSYCHIATRIC:  Normal affect   ASSESSMENT:    1. Coronary artery disease of bypass graft of native heart with stable angina pectoris ([place])   2. Precordial pain   3. Bipolar 1 disorder, depressed, severe ([place])   4. Mixed hyperlipidemia   5. Essential hypertension   6. PAD (peripheral artery disease) ([place])   7. Educated about COVID-19 virus infection    PLAN:    In order of problems listed above:  1. Recent coronary angiogram demonstrated the absence of coronary artery disease with an atretic LIMA to the LAD.  A nonflow limiting bend/kink in the ostial LAD led to surgery.  Repeat catheterizations demonstrated normal caliber of the ostial LAD.  Despite prior history, I need to exclude the possibility of obstructive coronary disease currently.  We will perform a coronary CT angio with FFR if indicated.  My suspicion is that significant obstructive coronary disease is not  present. 2. Current chest pain is atypical and is poorly characterized.  Chest discomfort has been chronic over years.  The persistent nature of the discomfort in conjunction with the abnormality noted on initial catheterization in 2003 explains the aggressive nature of therapy.  When you listen to the patient, 1 gets the impression that perhaps he is having class III or class for angina. 3. Perhaps bipolar disorder associated with chronic pain syndrome could have led to bypass surgery 4. LDL cholesterol is very well controlled on Lipitor 80 mg/day and is less than 60. 5. The blood pressure is adequately controlled.  He should continue amlodipine 5 mg/day metoprolol tartrate 25 mg/day lisinopril 10 mg/day and hydrochlorothiazide 25 mg/day.  He also takes furosemide. 6. Not discussed   Medication Adjustments/Labs and Tests Ordered: Current medicines are reviewed at length with the patient today.  Concerns regarding medicines are outlined above.  Orders Placed This Encounter  Procedures   CT CORONARY MORPH W/CTA COR W/SCORE W/CA W/CM &/OR WO/CM   CT CORONARY FRACTIONAL FLOW RESERVE DATA PREP   CT CORONARY FRACTIONAL FLOW RESERVE FLUID ANALYSIS   Basic metabolic panel   ECHOCARDIOGRAM COMPLETE   No orders of the defined types were placed in this encounter.   Patient Instructions  Medication Instructions:  Your physician recommends that you continue on your current  medications as directed. Please refer to the Current Medication list given to you today. *If you need a refill on your cardiac medications before your next appointment, please call your pharmacy*   Lab Work: None If you have labs (blood work) drawn today and your tests are completely normal, you will receive your results only by:  [facility] (if you have MyChart) OR  A paper copy in the mail If you have any lab test that is abnormal or we need to change your treatment, we will call you to review the  results.   Testing/Procedures: Your physician has requested that you have an echocardiogram. Echocardiography is a painless test that uses sound waves to create images of your heart. It provides your doctor with information about the size and shape of your heart and how well your hearts chambers and valves are working. This procedure takes approximately one hour. There are no restrictions for this procedure.  Your physician recommends that you have a coronary CT performed.   Follow-Up: At [facility], you and your health needs are our priority.  As part of our continuing mission to provide you with exceptional heart care, we have created designated Provider Care Teams.  These Care Teams include your primary Cardiologist (physician) and Advanced Practice Providers (APPs -  Physician Assistants and Nurse Practitioners) who all work together to provide you with the care you need, when you need it.  We recommend signing up for the patient portal called "MyChart".  Sign up information is provided on this After Visit Summary.  MyChart is used to connect with patients for Virtual Visits (Telemedicine).  Patients are able to view lab/test results, encounter notes, upcoming appointments, etc.  Non-urgent messages can be sent to your provider as well.   To learn more about what you can do with MyChart, go to [email or web address].    Your next appointment:   As needed  The format for your next appointment:   In Person  Provider:   You may see Dr Daneen Schick or one of the following Advanced Practice Providers on your designated Care Team:    Truitt Merle, NP  Cecilie Kicks, NP  Kathyrn Drown, NP    Other Instructions Your cardiac CT will be scheduled at one of the below locations:   [facility] [address] [place], [place] [address] [phone]  [facility] [address] [place], [place] [address] [phone]  If scheduled at [facility], please arrive at the [facility] main entrance of [facility] 30 minutes prior to test start time. Proceed to the [facility] Radiology Department (first floor) to check-in and test prep.  If scheduled at [facility], please arrive 15 mins early for check-in and test prep.  Please follow these instructions carefully (unless otherwise directed):  Hold all erectile dysfunction medications at least 3 days (72 hrs) prior to test.  On the Night Before the Test:  Be sure to Drink plenty of water.  Do not consume any caffeinated/decaffeinated beverages or chocolate 12 hours prior to your test.  Do not take any antihistamines 12 hours prior to your test.  On the Day of the Test:  Drink plenty of water. Do not drink any water within one hour of the test.  Do not eat any food 4 hours prior to the test.  You may take your regular medications prior to the test.   Take metoprolol (Lopressor)  two hours prior to test.  HOLD Furosemide/Hydrochlorothiazide morning of the test.   HOLD metformin the morning of your procedure         After the Test:  Drink plenty of water.  After receiving IV contrast, you may experience a mild flushed feeling. This is normal.  On occasion, you may experience a mild rash up to 24 hours after the test. This is not dangerous. If this occurs, you can take Benadryl 25 mg and increase your fluid intake.  If you experience trouble breathing, this can be serious. If it is severe call 911 IMMEDIATELY. If it is mild, please call our office.  If you take any of these medications: Glipizide/Metformin, Avandament, Glucavance, please do not take 48 hours after completing test unless otherwise instructed.   Once we have confirmed authorization from your insurance company, we will call you to set up a date and time for your test. Based on how quickly your insurance processes prior  authorizations requests, please allow up to 4 weeks to be contacted for scheduling your Cardiac CT appointment. Be advised that routine Cardiac CT appointments could be scheduled as many as 8 weeks after your provider has ordered it.  For non-scheduling related questions, please contact the cardiac imaging nurse navigator should you have any questions/concerns: Marchia Bond, Cardiac Imaging Nurse Navigator Burley Saver, Interim Cardiac Imaging Nurse [place] and Vascular Services Direct Office Dial: [phone]   For scheduling needs, including cancellations and rescheduling, please call Vivien Rota at [phone], option 3.        Signed, Sinclair Grooms, MD  09/19/2019 6:11 PM    [facility]

## 2019-09-19 ENCOUNTER — Encounter: Payer: Self-pay | Admitting: Interventional Cardiology

## 2019-09-19 ENCOUNTER — Ambulatory Visit (INDEPENDENT_AMBULATORY_CARE_PROVIDER_SITE_OTHER): Payer: Medicare Other | Admitting: Interventional Cardiology

## 2019-09-19 ENCOUNTER — Telehealth: Payer: Self-pay | Admitting: *Deleted

## 2019-09-19 VITALS — BP 132/78 | HR 80 | Ht 70.0 in | Wt 280.0 lb

## 2019-09-19 DIAGNOSIS — I25708 Atherosclerosis of coronary artery bypass graft(s), unspecified, with other forms of angina pectoris: Secondary | ICD-10-CM

## 2019-09-19 DIAGNOSIS — Z7189 Other specified counseling: Secondary | ICD-10-CM

## 2019-09-19 DIAGNOSIS — F314 Bipolar disorder, current episode depressed, severe, without psychotic features: Secondary | ICD-10-CM | POA: Diagnosis not present

## 2019-09-19 DIAGNOSIS — R072 Precordial pain: Secondary | ICD-10-CM | POA: Diagnosis not present

## 2019-09-19 DIAGNOSIS — I10 Essential (primary) hypertension: Secondary | ICD-10-CM

## 2019-09-19 DIAGNOSIS — I739 Peripheral vascular disease, unspecified: Secondary | ICD-10-CM

## 2019-09-19 DIAGNOSIS — E782 Mixed hyperlipidemia: Secondary | ICD-10-CM

## 2019-09-19 NOTE — Patient Instructions (Signed)
Medication Instructions:  Your physician recommends that you continue on your current medications as directed. Please refer to the Current Medication list given to you today. *If you need a refill on your cardiac medications before your next appointment, please call your pharmacy*   Lab Work: None If you have labs (blood work) drawn today and your tests are completely normal, you will receive your results only by:  [place] (if you have MyChart) OR  A paper copy in the mail If you have any lab test that is abnormal or we need to change your treatment, we will call you to review the results.   Testing/Procedures: Your physician has requested that you have an echocardiogram. Echocardiography is a painless test that uses sound waves to create images of your heart. It provides your doctor with information about the size and shape of your heart and how well your hearts chambers and valves are working. This procedure takes approximately one hour. There are no restrictions for this procedure.  Your physician recommends that you have a coronary CT performed.   Follow-Up: At [facility], you and your health needs are our priority.  As part of our continuing mission to provide you with exceptional heart care, we have created designated Provider Care Teams.  These Care Teams include your primary Cardiologist (physician) and Advanced Practice Providers (APPs -  Physician Assistants and Nurse Practitioners) who all work together to provide you with the care you need, when you need it.  We recommend signing up for the patient portal called "MyChart".  Sign up information is provided on this After Visit Summary.  MyChart is used to connect with patients for Virtual Visits (Telemedicine).  Patients are able to view lab/test results, encounter notes, upcoming appointments, etc.  Non-urgent messages can be sent to your provider as well.   To learn more about what you can do with MyChart, go to  [email or web address].    Your next appointment:   As needed  The format for your next appointment:   In Person  Provider:   You may see Dr Daneen Schick or one of the following Advanced Practice Providers on your designated Care Team:    Truitt Merle, NP  Cecilie Kicks, NP  Kathyrn Drown, NP    Other Instructions Your cardiac CT will be scheduled at one of the below locations:   [facility] [address] [place], [place] [address] [phone]  [place] [address] [place], [place] [address] [phone]  If scheduled at [facility], please arrive at the [facility] main entrance of [facility] 30 minutes prior to test start time. Proceed to the [facility] Radiology Department (first floor) to check-in and test prep.  If scheduled at [facility], please arrive 15 mins early for check-in and test prep.  Please follow these instructions carefully (unless otherwise directed):  Hold all erectile dysfunction medications at least 3 days (72 hrs) prior to test.  On the Night Before the Test:  Be sure to Drink plenty of water.  Do not consume any caffeinated/decaffeinated beverages or chocolate 12 hours prior to your test.  Do not take any antihistamines 12 hours prior to your test.  On the Day of the Test:  Drink plenty of water. Do not drink any water within one hour of the test.  Do not eat any food 4 hours prior to the test.  You may take your  regular medications prior to the test.   Take metoprolol (Lopressor) two hours prior to test.  HOLD Furosemide/Hydrochlorothiazide morning of the test.   HOLD metformin the morning of your procedure         After the Test:  Drink plenty of water.  After receiving IV contrast, you may experience a mild flushed feeling. This is normal.  On occasion, you may experience a mild rash up to 24 hours after  the test. This is not dangerous. If this occurs, you can take Benadryl 25 mg and increase your fluid intake.  If you experience trouble breathing, this can be serious. If it is severe call 911 IMMEDIATELY. If it is mild, please call our office.  If you take any of these medications: Glipizide/Metformin, Avandament, Glucavance, please do not take 48 hours after completing test unless otherwise instructed.   Once we have confirmed authorization from your insurance company, we will call you to set up a date and time for your test. Based on how quickly your insurance processes prior authorizations requests, please allow up to 4 weeks to be contacted for scheduling your Cardiac CT appointment. Be advised that routine Cardiac CT appointments could be scheduled as many as 8 weeks after your provider has ordered it.  For non-scheduling related questions, please contact the cardiac imaging nurse navigator should you have any questions/concerns: Marchia Bond, Cardiac Imaging Nurse Navigator Burley Saver, Interim Cardiac Imaging Nurse [place] [facility] Direct Office Dial: [phone]   For scheduling needs, including cancellations and rescheduling, please call Vivien Rota at [phone], option 3.

## 2019-09-19 NOTE — Telephone Encounter (Signed)
PA pending clinical review via [place]. Ref# [id number]

## 2019-09-20 ENCOUNTER — Telehealth: Payer: Self-pay | Admitting: Emergency Medicine

## 2019-09-20 NOTE — Telephone Encounter (Signed)
Tried to call pt, no answer, LMOVM for return call.  

## 2019-09-20 NOTE — Telephone Encounter (Signed)
Pt called office, informed him of pre-op appt 10/12/19 at 2:45pm. Procedure is 10/16/19 at 3:00pm, arrival time will be told by pre-op nurse.

## 2019-09-20 NOTE — Telephone Encounter (Signed)
Pt has a question about what time he needs to be at the hospital the day of his procedure. He also wanted to know about his pre op time and date due to Transportation issues. Please call him at [phone]

## 2019-10-02 NOTE — Telephone Encounter (Signed)
PA approved for EGD/ED. Auth# [id number] dates 10/16/2019-01/14/2020

## 2019-10-08 ENCOUNTER — Ambulatory Visit (HOSPITAL_COMMUNITY): Payer: Medicare Other | Attending: Internal Medicine

## 2019-10-08 ENCOUNTER — Other Ambulatory Visit: Payer: Self-pay

## 2019-10-08 DIAGNOSIS — I25708 Atherosclerosis of coronary artery bypass graft(s), unspecified, with other forms of angina pectoris: Secondary | ICD-10-CM | POA: Diagnosis not present

## 2019-10-08 DIAGNOSIS — R072 Precordial pain: Secondary | ICD-10-CM | POA: Insufficient documentation

## 2019-10-08 LAB — ECHOCARDIOGRAM COMPLETE
Area-P 1/2: 3.35 cm2
S' Lateral: 3.3 cm

## 2019-10-09 ENCOUNTER — Telehealth: Payer: Self-pay | Admitting: *Deleted

## 2019-10-09 ENCOUNTER — Other Ambulatory Visit: Payer: Self-pay | Admitting: *Deleted

## 2019-10-09 DIAGNOSIS — R072 Precordial pain: Secondary | ICD-10-CM

## 2019-10-09 DIAGNOSIS — I25708 Atherosclerosis of coronary artery bypass graft(s), unspecified, with other forms of angina pectoris: Secondary | ICD-10-CM

## 2019-10-09 NOTE — Telephone Encounter (Signed)
-----  Message from Dalton [name], [place] sent at 10/08/2019  7:56 AM EDT ----- Regarding: FW: Ct heart  ----- Message ----- From: Roosvelt Maser Sent: 10/02/2019  10:44 AM EDT To: Zeb Comfort, RN, Dalton [name], CMA Subject: Ct heart                                         Patient scheduled on 8/26 @ 7:45am.  He will need labs  Thanks, Vivien Rota

## 2019-10-09 NOTE — Telephone Encounter (Signed)
Spoke with pt and he is going to [facility] for covid testing on 8/20.  He will go to [facility] across from [facility] on same day for labs.  Pt verbalized understanding and was agreeable to plan.

## 2019-10-11 ENCOUNTER — Ambulatory Visit (INDEPENDENT_AMBULATORY_CARE_PROVIDER_SITE_OTHER): Payer: Medicare Other | Admitting: Ophthalmology

## 2019-10-11 ENCOUNTER — Encounter (INDEPENDENT_AMBULATORY_CARE_PROVIDER_SITE_OTHER): Payer: Self-pay | Admitting: Ophthalmology

## 2019-10-11 ENCOUNTER — Other Ambulatory Visit: Payer: Self-pay

## 2019-10-11 DIAGNOSIS — H31001 Unspecified chorioretinal scars, right eye: Secondary | ICD-10-CM | POA: Diagnosis not present

## 2019-10-11 DIAGNOSIS — Z961 Presence of intraocular lens: Secondary | ICD-10-CM

## 2019-10-11 DIAGNOSIS — E119 Type 2 diabetes mellitus without complications: Secondary | ICD-10-CM | POA: Insufficient documentation

## 2019-10-11 DIAGNOSIS — E113393 Type 2 diabetes mellitus with moderate nonproliferative diabetic retinopathy without macular edema, bilateral: Secondary | ICD-10-CM

## 2019-10-11 NOTE — Assessment & Plan Note (Signed)

## 2019-10-11 NOTE — Progress Notes (Signed)
10/11/2019     CHIEF COMPLAINT Patient presents for Retina Follow Up   HISTORY OF PRESENT ILLNESS: Dalton [name] is a 59 y.o. male who presents to the clinic today for:   HPI    Retina Follow Up    Patient presents with  Diabetic Retinopathy.  In both eyes.  This started 1 year ago.  Severity is mild.  Duration of 1 year.  Since onset it is stable.          Comments    1 Year Diabetic F/U OU  Pt c/o fluctuating VA OU. Pt denies any other new symptoms. Pt sts he has to have procedure to have esophagus enlarged next week. A1c: pt does not recall LBS: "has not been high" but "up and down"       Last edited by Rockie Neighbours, [place] on 10/11/2019  2:08 PM. (History)      Referring physician: Nolene Ebbs, MD [place],  [place] [address]  HISTORICAL INFORMATION:   Selected notes from the MEDICAL RECORD NUMBER    Lab Results  Component Value Date   HGBA1C 6.1 (H) 03/06/2019     CURRENT MEDICATIONS: No current outpatient medications on file. (Ophthalmic Drugs)   No current facility-administered medications for this visit. (Ophthalmic Drugs)   Current Outpatient Medications (Other)  Medication Sig  . albuterol (PROVENTIL HFA;VENTOLIN HFA) 108 (90 Base) MCG/ACT inhaler Inhale 2 puffs into the lungs every 6 (six) hours as needed for wheezing or shortness of breath.   [name] Kitchen albuterol (PROVENTIL) (2.5 MG/3ML) 0.083% nebulizer solution Take 2.5 mg by nebulization 2 (two) times daily as needed for wheezing or shortness of breath.   . ALPRAZolam (XANAX) 1 MG tablet Take 1 mg by mouth 3 (three) times daily.   [name] Kitchen amLODipine (NORVASC) 5 MG tablet Take 1 tablet (5 mg total) by mouth daily.  [name] Kitchen aspirin-sod bicarb-citric acid (ALKA-SELTZER) 325 MG TBEF tablet Take 650 mg by mouth every 6 (six) hours as needed (immune support).  [name] Kitchen atorvastatin (LIPITOR) 80 MG tablet Take 1 tablet (80 mg total) by mouth daily.  . beclomethasone (QVAR) 80 MCG/ACT inhaler Inhale 2 puffs into  the lungs 2 (two) times daily as needed (respiratory issues.).   [name] Kitchen BELSOMRA 15 MG TABS Take 15 mg by mouth at bedtime.   . cetirizine (ZYRTEC) 10 MG tablet Take 10 mg by mouth daily as needed for allergies.   [name] Kitchen clopidogrel (PLAVIX) 75 MG tablet Take 75 mg by mouth daily.  [name] Kitchen dexlansoprazole (DEXILANT) 60 MG capsule Take 1 capsule (60 mg total) by mouth daily.  . Evolocumab (REPATHA SURECLICK) 141 MG/ML SOAJ Inject 140 mg into the skin every 14 (fourteen) days.  [name] Kitchen ezetimibe (ZETIA) 10 MG tablet Take 10 mg by mouth daily.  . fluticasone (FLONASE) 50 MCG/ACT nasal spray Place 2 sprays into both nostrils daily.   . Fluticasone Furoate (ARNUITY ELLIPTA) 200 MCG/ACT AEPB Inhale 1 puff into the lungs daily.  . furosemide (LASIX) 40 MG tablet TAKE 1 TABLET(40 MG) BY MOUTH DAILY (Patient taking differently: Take 40 mg by mouth daily. )  . glipiZIDE (GLUCOTROL) 10 MG tablet Take 10 mg by mouth daily before breakfast.  . hydrochlorothiazide (HYDRODIURIL) 25 MG tablet Take 25 mg by mouth daily.  [name] Kitchen ipratropium (ATROVENT) 0.02 % nebulizer solution Take 0.5 mg by nebulization 2 (two) times daily as needed for wheezing or shortness of breath (for congestion).   . isosorbide mononitrate (IMDUR) 30 MG 24 hr tablet Take 0.5  tablets (15 mg total) by mouth at bedtime.  [name] Kitchen LINZESS 290 MCG CAPS capsule TAKE 1 CAPSULE(290 MCG) BY MOUTH DAILY BEFORE BREAKFAST (Patient taking differently: Take 290 mcg by mouth daily before breakfast. )  . lisinopril (PRINIVIL,ZESTRIL) 10 MG tablet Take 10 mg by mouth daily.  . Liver Extract (LIVER PO) Take 1 tablet by mouth daily. "Liver Aid"  . magnesium citrate SOLN Take 1 Bottle by mouth as needed for mild constipation, moderate constipation or severe constipation.   . metFORMIN (GLUCOPHAGE-XR) 500 MG 24 hr tablet Take 1,000 mg by mouth daily with breakfast.  . metoprolol tartrate (LOPRESSOR) 25 MG tablet Take 0.5 tablets (12.5 mg total) by mouth 2 (two) times daily.  . Multiple Vitamin  (MULTIVITAMIN WITH MINERALS) TABS tablet Take 1 tablet by mouth daily.  . Multiple Vitamins-Minerals (EMERGEN-C IMMUNE) PACK Take 1 packet by mouth daily.  . naloxegol oxalate (MOVANTIK) 25 MG TABS tablet Take 1 tablet (25 mg total) by mouth daily.  [name] Kitchen NARCAN 4 MG/0.1ML LIQD nasal spray kit Place 1 spray into the nose as needed (opioid overdose).   . nitroGLYCERIN (NITROSTAT) 0.4 MG SL tablet SEE NOTES (Patient taking differently: Place 0.4 mg under the tongue every 5 (five) minutes as needed for chest pain. )  . oxyCODONE-acetaminophen (PERCOCET) 10-325 MG tablet Take 1 tablet by mouth every 4 (four) hours as needed for pain.  . potassium chloride SA (KLOR-CON) 20 MEQ tablet Take 20 mEq by mouth 2 (two) times daily.  . sertraline (ZOLOFT) 100 MG tablet Take 100 mg by mouth daily.  . sitaGLIPtin (JANUVIA) 100 MG tablet Take 100 mg by mouth daily.  . tamsulosin (FLOMAX) 0.4 MG CAPS capsule Take 1 capsule (0.4 mg total) by mouth daily after breakfast.  . Vitamin D, Ergocalciferol, (DRISDOL) 1.25 MG (50000 UT) CAPS capsule Take 50,000 Units by mouth every 7 (seven) days. Monday   No current facility-administered medications for this visit. (Other)      REVIEW OF SYSTEMS:    ALLERGIES Allergies  Allergen Reactions  . Gabapentin Hives  . Ibuprofen Other (See Comments)    Stomach  Upset and stomach ulcers  . Zolpidem Tartrate Other (See Comments)    Hallucinations   . Naproxen Other (See Comments)    HALLUCINATIONS    PAST MEDICAL HISTORY Past Medical History:  Diagnosis Date  . Anemia   . Anxiety   . Asthma   . Bipolar disorder ([place])   . CHF (congestive heart failure) ([place])   . Chronic back pain    Pain Clinic in [place]  . Chronic bronchitis   . Chronic lower back pain   . Congestive heart failure (CHF) ([place])   . COPD (chronic obstructive pulmonary disease) ([place])    per patient 03/05/19  . Coronary artery disease   . Coughing   . Depression   . DVT (deep venous  thrombosis) ([place]) ~ 2005   LLE  . Frequency of urination   . GERD (gastroesophageal reflux disease)   . 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 GMWNUUVO(536.6)    "a few times/week" (12/24/2015)  . High cholesterol   . HNP (herniated nucleus pulposus), cervical   . Hypertension   . Laceration of right hand 11/27/2010  . Laceration of wrist 2007 BIL FOREARMS  . MI (myocardial infarction) ([place])    7, last one was in 2011 (12/24/2015)  . Neuromuscular disorder (HCC)    legs/feet  .  Neuropathy    legs/feet  . NSAID-induced gastric ulcer    "Ibuprofen"  . PUD (peptic ulcer disease)    in 1990s, secondary to medication  . Rheumatoid arthritis ([place])    "all over" (12/24/2015)  . Shortness of breath    with exertion  . Tonsillitis, chronic    Dr. Vicki Mallet in [place]  . Type II diabetes mellitus ([place])   . Wears glasses    Past Surgical History:  Procedure Laterality Date  . ANTERIOR CERVICAL DECOMP/DISCECTOMY FUSION N/A 11/05/2016   Procedure: Anterior Cervical Discectomy and Fusion - Cervical three-Cervical four;  Surgeon: Earnie Larsson, MD;  Location: [place];  Service: Neurosurgery;  Laterality: N/A;  . BACK SURGERY     x3  . BIOPSY N/A 05/30/2012   Procedure: BIOPSY;  Surgeon: Danie Binder, MD;  Location: [facility];  Service: Endoscopy;  Laterality: N/A;  . BIOPSY  03/02/2016   Procedure: BIOPSY;  Surgeon: Danie Binder, MD;  [facility];  Service: Endoscopy;;  gastric  . CARDIAC CATHETERIZATION  "several"  . CARPAL TUNNEL RELEASE Bilateral   . CATARACT EXTRACTION W/PHACO Right 06/15/2016   Procedure: CATARACT EXTRACTION PHACO AND INTRAOCULAR LENS PLACEMENT (IOC);  Surgeon: Rutherford Guys, MD;  Location: [facility];  Service: Ophthalmology;  Laterality: Right;  CDE: 4.64  . CATARACT EXTRACTION W/PHACO Left 07/13/2016   Procedure: CATARACT EXTRACTION PHACO AND INTRAOCULAR LENS PLACEMENT (IOC);  Surgeon: Rutherford Guys,  MD;  Location: [facility];  Service: Ophthalmology;  Laterality: Left;  CDE: 3.15  . COLONOSCOPY  12/28/2010   SLF: (MAC)Internal hemorrhoids/four small colon polyps tubular adenomas. per SLF: colonoscopy 2022  . COLONOSCOPY WITH PROPOFOL N/A 07/05/2017   Procedure: COLONOSCOPY WITH PROPOFOL;  Surgeon: Danie Binder, MD;  [facility];  Service: Endoscopy;  Laterality: N/A;  7:30am  . CORONARY ARTERY BYPASS GRAFT  2002   3 vessels  . ESOPHAGOGASTRODUODENOSCOPY N/A 05/30/2012   SLF: UNCONTROLLED GERD DUE TO LIFESTYLE CHOICE/WEIGHT GAIN/MILD Non-erosive gastritis  . ESOPHAGOGASTRODUODENOSCOPY (EGD) WITH PROPOFOL N/A 03/02/2016   Dr. Oneida Alar: Esophagus appeared normal, impaired dilation performed, patchy inflammation with edema and erythema of the entire stomach., Biopsy with H pylori, patient completed Pylera.   [name] Kitchen FRACTURE SURGERY    . LEFT HEART CATHETERIZATION WITH CORONARY ANGIOGRAM N/A 08/31/2011   Procedure: LEFT HEART CATHETERIZATION WITH CORONARY ANGIOGRAM;  Surgeon: Laverda Page, MD;  [facility] C[facility] LAB;  Service: Cardiovascular;  Laterality: N/A;  . LUMBAR DISC SURGERY     "L4-5; Dr. Trenton Gammon"  . LUMBAR LAMINECTOMY/DECOMPRESSION MICRODISCECTOMY Right 03/06/2019   Procedure: MICRODISCECTOMY EXTRAFORAMINAL LUMBAR THREE - LUMBAR FOUR RIGHT;  Surgeon: Earnie Larsson, MD;  Location: [place];  Service: Neurosurgery;  Laterality: Right;  MICRODISCECTOMY EXTRAFORAMINAL LUMBAR THREE - LUMBAR FOUR RIGHT  . MULTIPLE TOOTH EXTRACTIONS    . ORIF MANDIBULAR FRACTURE N/A 12/19/2015   Procedure: OPEN REDUCTION INTERNAL FIXATION (ORIF) MANDIBULAR FRACTURE;  Surgeon: Izora Gala, MD;  Location: [place];  Service: ENT;  Laterality: N/A;  . PATELLA FRACTURE SURGERY Left 1976   plate to knee cap from accident  . POLYPECTOMY  07/05/2017   Procedure: POLYPECTOMY;  Surgeon: Danie Binder, MD;  [facility];  Service: Endoscopy;;  colon  . SAVORY DILATION  12/28/2010   SLF:(MAC)J-shaped  stomach/nodular mocosa in the distal esophagus/empiric dilation 47m  . SAVORY DILATION N/A 03/02/2016   Procedure: SAVORY DILATION;  Surgeon: SDanie Binder MD;  [facility];  Service: Endoscopy;  Laterality: N/A;  FAMILY HISTORY Family History  Problem Relation Age of Onset  . Diabetes Mother   . Hypertension Mother   . Heart attack Mother 4  . Hypertension Father   . Diabetes Father   . Heart attack Father 26  . Heart attack Other        mother, father, brother, sister all deceased due to MI  . Heart attack Sister   . Heart attack Brother   . Seizures Brother   . Heart failure Other   . Colon cancer Neg Hx   . Liver disease Neg Hx   . Anesthesia problems Neg Hx   . Hypotension Neg Hx   . Malignant hyperthermia Neg Hx   . Pseudochol deficiency Neg Hx   . Colon polyps Neg Hx     SOCIAL HISTORY Social History   Tobacco Use  . Smoking status: Never Smoker  . Smokeless tobacco: Never Used  Vaping Use  . Vaping Use: Never used  Substance Use Topics  . Alcohol use: No    Alcohol/week: 0.0 standard drinks  . 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 (as of 6/29/201)         OPHTHALMIC EXAM:  Base Eye Exam    Visual Acuity (ETDRS)      Right Left   Dist [place] 20/50 +2 20/20 -2   Dist ph  20/25 +2        Tonometry (Tonopen, 2:10 PM)      Right Left   Pressure 14 13       Pupils      Pupils Dark Light Shape React APD   Right PERRL 5 5 Round Minimal None   Left PERRL 2 2 Round Minimal None       Visual Fields (Counting fingers)      Left Right    Full Full       Extraocular Movement      Right Left    Full Full       Neuro/Psych    Oriented x3: Yes   Mood/Affect: Normal       Dilation    Both eyes: 1.0% Mydriacyl, 2.5% Phenylephrine @ 2:14 PM        Slit Lamp and Fundus Exam    External Exam      Right Left   External Normal Normal       Slit Lamp Exam      Right  Left   Lids/Lashes Normal Normal   Conjunctiva/Sclera White and quiet White and quiet   Cornea Clear Clear   Anterior Chamber Deep and quiet Deep and quiet   Iris Round and reactive Round and reactive   Lens Posterior chamber intraocular lens Posterior chamber intraocular lens   Anterior Vitreous Normal Normal       Fundus Exam      Right Left   Posterior Vitreous Normal Normal   Disc Normal Normal   C/D Ratio  0.1   Macula Normal Normal   Vessels Normal NPDR- Moderate   Periphery Normal Normal          IMAGING AND PROCEDURES  Imaging and Procedures for 10/11/19  Color Fundus Photography Optos - OU - Both Eyes       Right Eye Progression has been stable. Disc findings include normal observations. Macula : normal observations. Periphery : degeneration.   Left Eye Progression has been stable. Disc findings include normal observations. Macula :  normal observations. Periphery : normal observations.   Notes OD with old pigmented chorioretinal scar inferotemporal , No active traction, no holes or tears.  No active disease..  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  Forms of therapy reviewed include the use of Anti-VEGF medications injected painlessly into the eye, as well as other possible treatment modalities, including thermal laser therapy. Fellow eye involvement and risks were discussed with the patient. Upon the finding of wet age related macular degeneration, treatment will be offered. The treatment regimen is on a treat as needed basis with the intent to treat if necessary and extend interval of exams when possible. On average 1 out of 6 patients do not need lifetime therapy. However, the risk of recurrent disease is high for a lifetime.  Initially monthly, then periodic, examinations  and evaluations will determine whether the next treatment is required on the day of the examination.      ICD-10-CM   1. Moderate nonproliferative diabetic retinopathy of both eyes without macular edema associated with type 2 diabetes mellitus (HCC)  N62.9528 Color Fundus Photography Optos - OU - Both Eyes  2. Pseudophakia  Z96.1   3. Chorioretinal scar, right eye  H31.001 Color Fundus Photography Optos - OU - Both Eyes    1.  2.  3.  Ophthalmic Meds Ordered this visit:  No orders of the defined types were placed in this encounter.      Return in about 1 year (around 10/10/2020) for dilate, OCT.  There are no Patient Instructions on file for this visit.   Explained the diagnoses, plan, and follow up with the patient and they expressed understanding.  Patient expressed understanding of the importance of proper follow up care.   Clent Demark Samarth Ogle M.D. Diseases & Surgery of the Retina and Vitreous Retina & Diabetic [place] 10/11/19     Abbreviations: M myopia (nearsighted); A astigmatism; H hyperopia (farsighted); P presbyopia; Mrx spectacle prescription;  CTL contact lenses; OD right eye; OS left eye; OU both eyes  XT exotropia; ET esotropia; PEK punctate epithelial keratitis; PEE punctate epithelial erosions; DES dry eye syndrome; MGD meibomian gland dysfunction; ATs artificial tears; PFAT's preservative free artificial tears; [place] nuclear sclerotic cataract; PSC posterior subcapsular cataract; ERM epi-retinal membrane; PVD posterior vitreous detachment; RD retinal detachment; DM diabetes mellitus; DR diabetic retinopathy; NPDR non-proliferative diabetic retinopathy; PDR proliferative diabetic retinopathy; CSME clinically significant macular edema; DME diabetic macular edema; dbh dot blot hemorrhages; CWS cotton wool spot; POAG primary open angle glaucoma; C/D cup-to-disc ratio; HVF humphrey visual field; GVF goldmann visual field; OCT optical coherence tomography; IOP intraocular  pressure; BRVO Branch retinal vein occlusion; CRVO central retinal vein occlusion; CRAO central retinal artery occlusion; BRAO branch retinal artery occlusion; RT retinal tear; SB scleral buckle; PPV pars plana vitrectomy; VH Vitreous hemorrhage; PRP panretinal laser photocoagulation; IVK intravitreal kenalog; VMT vitreomacular traction; MH Macular hole;  NVD neovascularization of the disc; NVE neovascularization elsewhere; AREDS age related eye disease study; ARMD age related macular degeneration; POAG primary open angle glaucoma; EBMD epithelial/anterior basement membrane dystrophy; ACIOL anterior chamber intraocular lens; IOL intraocular lens; PCIOL posterior chamber intraocular lens; Phaco/IOL phacoemulsification with intraocular lens placement; [place] photorefractive keratectomy; LASIK laser assisted in situ keratomileusis; HTN hypertension; DM diabetes mellitus; COPD chronic obstructive pulmonary disease

## 2019-10-11 NOTE — Patient Instructions (Addendum)
38    Your procedure is scheduled on: 10/16/2019  Report to [facility] at     1:30 PM.  Call this number if you have problems the morning of surgery: [phone]   Remember:   Follow instructions on letter from office regarding when to stop eating and drinking                    No Smoking the day of procedure      Take these medicines the morning of surgery with A SIP OF WATER: Amlodipine, zyrtec, Dexilant, Zoloft, Flonase, isosorbide,                             metopolol, and oxycodone if needed                          Use inhalers if needed                            No diabetic medication am of procedure   Do not wear jewelry, make-up or nail polish.  Do not wear lotions, powders, or perfumes. You may wear deodorant.                Do not bring valuables to the hospital.  Contacts, dentures or bridgework may not be worn into surgery.  Leave suitcase in the car. After surgery it may be brought to your room.  For patients admitted to the hospital, checkout time is 11:00 AM the day of discharge.   Patients discharged the day of surgery will not be allowed to drive home. Upper Endoscopy, Adult Upper endoscopy is a procedure to look inside the upper GI (gastrointestinal) tract. The upper GI tract is made up of:  The part of the body that moves food from your mouth to your stomach (esophagus).  The stomach.  The first part of your small intestine (duodenum). This procedure is also called esophagogastroduodenoscopy (EGD) or gastroscopy. In this procedure, your health care provider passes a thin, flexible tube (endoscope) through your mouth and down your esophagus into your stomach. A small camera is attached to the end of the tube. Images from the camera appear on a monitor in the exam room. During this procedure, your health care provider may also remove a small piece of tissue to be sent to a lab and examined under a microscope (biopsy). Your health care provider may do an upper  endoscopy to diagnose cancers of the upper GI tract. You may also have this procedure to find the cause of other conditions, such as:  Stomach pain.  Heartburn.  Pain or problems when swallowing.  Nausea and vomiting.  Stomach bleeding.  Stomach ulcers. Tell a health care provider about:  Any allergies you have.  All medicines you are taking, including vitamins, herbs, eye drops, creams, and over-the-counter medicines.  Any problems you or family members have had with anesthetic medicines.  Any blood disorders you have.  Any surgeries you have had.  Any medical conditions you have.  Whether you are pregnant or may be pregnant. What are the risks? Generally, this is a safe procedure. However, problems may occur, including:  Infection.  Bleeding.  Allergic reactions to medicines.  A tear or hole (perforation) in the esophagus, stomach, or duodenum. What happens before the procedure? Staying hydrated Follow instructions from your health care provider  about hydration, which may include:  Up to 2 hours before the procedure - you may continue to drink clear liquids, such as water, clear fruit juice, black coffee, and plain tea.  Eating and drinking restrictions Follow instructions from your health care provider about eating and drinking, which may include:  8 hours before the procedure - stop eating heavy meals or foods, such as meat, fried foods, or fatty foods.  6 hours before the procedure - stop eating light meals or foods, such as toast or cereal.  6 hours before the procedure - stop drinking milk or drinks that contain milk.  2 hours before the procedure - stop drinking clear liquids. Medicines Ask your health care provider about:  Changing or stopping your regular medicines. This is especially important if you are taking diabetes medicines or blood thinners.  Taking medicines such as aspirin and ibuprofen. These medicines can thin your blood. Do not take  these medicines unless your health care provider tells you to take them.  Taking over-the-counter medicines, vitamins, herbs, and supplements. General instructions  Plan to have someone take you home from the hospital or clinic.  If you will be going home right after the procedure, plan to have someone with you for 24 hours.  Ask your health care provider what steps will be taken to help prevent infection. What happens during the procedure?  1. An IV will be inserted into one of your veins. 2. You may be given one or more of the following: ? A medicine to help you relax (sedative). ? A medicine to numb the throat (local anesthetic). 3. You will lie on your left side on an exam table. 4. Your health care provider will pass the endoscope through your mouth and down your esophagus. 5. Your health care provider will use the scope to check the inside of your esophagus, stomach, and duodenum. Biopsies may be taken. 6. The endoscope will be removed. The procedure may vary among health care providers and hospitals. What happens after the procedure?  Your blood pressure, heart rate, breathing rate, and blood oxygen level will be monitored until you leave the hospital or clinic.  Do not drive for 24 hours if you were given a sedative during your procedure.  When your throat is no longer numb, you may be given some fluids to drink.  It is up to you to get the results of your procedure. Ask your health care provider, or the department that is doing the procedure, when your results will be ready. Summary  Upper endoscopy is a procedure to look inside the upper GI tract.  During the procedure, an IV will be inserted into one of your veins. You may be given a medicine to help you relax.  A medicine will be used to numb your throat.  The endoscope will be passed through your mouth and down your esophagus. This information is not intended to replace advice given to you by your health care  provider. Make sure you discuss any questions you have with your health care provider. Document Revised: 08/03/2017 Document Reviewed: 07/11/2017 Elsevier Patient Education  [place].  End[facility]Care After  Please read the instructions outlined below and refer to this sheet in the next few weeks. These discharge instructions provide you with general information on caring for yourself after you leave the hospital. Your doctor may also give you specific instructions. While your treatment has been planned according to the most current medical practices available, unavoidable complications occasionally occur. If you have any problems or questions after discharge, please call your doctor. HOME CARE INSTRUCTIONS Activity  You may resume your regular activity but move at a slower pace for the next 24 hours.   Take frequent rest periods for the next 24 hours.   Walking will help expel (get rid of) the air and reduce the bloated feeling in your abdomen.   No driving for 24 hours (because of the anesthesia (medicine) used during the test).   You may shower.   Do not sign any important legal documents or operate any machinery for 24 hours (because of the anesthesia used during the test).  Nutrition  Drink plenty of fluids.   You may resume your normal diet.   Begin with a light meal and progress to your normal diet.   Avoid alcoholic beverages for 24 hours or as instructed by your caregiver.  Medications You may resume your normal medications unless your caregiver tells you otherwise. What you can expect today  You may experience abdominal discomfort such as a feeling of fullness or "gas" pains.   You may experience a sore throat for 2 to 3 days. This is normal. Gargling with salt water may help this.  Follow-up Your doctor will discuss the results  of your test with you. SEEK IMMEDIATE MEDICAL CARE IF:  You have excessive nausea (feeling sick to your stomach) and/or vomiting.   You have severe abdominal pain and distention (swelling).   You have trouble swallowing.   You have a temperature over 100 F (37.8 C).   You have rectal bleeding or vomiting of blood.  Document Released: 09/23/2003 Document Revised: 01/28/2011 Document Reviewed: 04/05/2007

## 2019-10-12 ENCOUNTER — Encounter (HOSPITAL_COMMUNITY): Payer: Self-pay

## 2019-10-12 ENCOUNTER — Encounter (HOSPITAL_COMMUNITY)
Admission: RE | Admit: 2019-10-12 | Discharge: 2019-10-12 | Disposition: A | Discharge status: Home / Self Care | Payer: Medicare Other | Source: Ambulatory Visit | Attending: Internal Medicine | Admitting: Internal Medicine

## 2019-10-12 ENCOUNTER — Other Ambulatory Visit: Payer: Self-pay | Admitting: Cardiology

## 2019-10-12 ENCOUNTER — Other Ambulatory Visit (HOSPITAL_COMMUNITY)
Admission: RE | Admit: 2019-10-12 | Discharge: 2019-10-12 | Disposition: A | Discharge status: Home / Self Care | Payer: Medicare Other | Source: Ambulatory Visit | Attending: Internal Medicine | Admitting: Internal Medicine

## 2019-10-12 DIAGNOSIS — Z01812 Encounter for preprocedural laboratory examination: Secondary | ICD-10-CM | POA: Diagnosis present

## 2019-10-12 LAB — BASIC METABOLIC PANEL
Anion gap: 8 (ref 5–15)
BUN: 6 mg/dL (ref 6–20)
CO2: 27 mmol/L (ref 22–32)
Calcium: 9.3 mg/dL (ref 8.9–10.3)
Chloride: 104 mmol/L (ref 98–111)
Creatinine, Ser: 0.75 mg/dL (ref 0.61–1.24)
GFR calc Af Amer: >60 mL/min (ref >60)
GFR calc non Af Amer: >60 mL/min (ref >60)
Glucose, Bld: 90 mg/dL (ref 70–99)
Potassium: 3.4 mmol/L — ABNORMAL LOW (ref 3.5–5.1)
Sodium: 139 mmol/L (ref 135–145)

## 2019-10-15 ENCOUNTER — Telehealth: Payer: Self-pay | Admitting: *Deleted

## 2019-10-15 NOTE — Telephone Encounter (Signed)
Spoke with patient and made aware. He states endo had already informed him.Please advise Randall Hiss if patient needs OV to reschedule? thanks

## 2019-10-15 NOTE — Telephone Encounter (Signed)
-----   Message from Encarnacion Chu, RN sent at 10/12/2019  2:30 PM EDT ----- Regarding: rescedule Hey Aqil Goetting! We just had Francee Piccolo in for PAT. He is scheduled for a cardiac CT on 10/18/2019. Dr Charna Elizabeth wants to postpone Mr Fouty's EGD/ED until after these results are available. If Dr Abbey Chatters has any questions, please have him contact Dr Charna Elizabeth. Thank you.

## 2019-10-16 ENCOUNTER — Telehealth (HOSPITAL_COMMUNITY): Payer: Self-pay | Admitting: *Deleted

## 2019-10-16 ENCOUNTER — Ambulatory Visit (HOSPITAL_COMMUNITY): Admission: RE | Admit: 2019-10-16 | Payer: Medicare Other | Source: Home / Self Care

## 2019-10-16 ENCOUNTER — Encounter (HOSPITAL_COMMUNITY): Admission: RE | Payer: Self-pay | Source: Home / Self Care

## 2019-10-16 SURGERY — ESOPHAGOGASTRODUODENOSCOPY (EGD) WITH PROPOFOL
Anesthesia: Monitor Anesthesia Care

## 2019-10-16 NOTE — Telephone Encounter (Signed)
Attempted to call patient regarding upcoming cardiac CT appointment. °Left message on voicemail with name and callback number ° °Kennley Schwandt Tai RN Navigator Cardiac Imaging °[facility] °[phone] Office °[phone] Cell ° °

## 2019-10-17 ENCOUNTER — Telehealth: Payer: Self-pay | Admitting: Interventional Cardiology

## 2019-10-17 ENCOUNTER — Telehealth (HOSPITAL_COMMUNITY): Payer: Self-pay | Admitting: Emergency Medicine

## 2019-10-17 NOTE — Telephone Encounter (Signed)
Attempted to call patient regarding upcoming cardiac CT appointment. °Left message on voicemail with name and callback number °Kandis Henry RN Navigator Cardiac Imaging °[facility] °[phone] Office °[phone] Cell ° °

## 2019-10-17 NOTE — Telephone Encounter (Signed)
Spoke with Dr. Abbey Chatters. Ok to just reschedule without repeat OV

## 2019-10-17 NOTE — Telephone Encounter (Signed)
Spoke with pt about labs.  Asked if he has been taking K+ as prescribed.  States he has had a lot going on so he has missed some doses.  Advised pt to get back to taking K+ as prescribed and increase K+ rich foods over the next few days to help bring that number back up.  Pt agreeable to plan.

## 2019-10-18 ENCOUNTER — Ambulatory Visit (HOSPITAL_COMMUNITY)
Admission: RE | Admit: 2019-10-18 | Discharge: 2019-10-18 | Disposition: A | Discharge status: Home / Self Care | Payer: Medicare Other | Source: Ambulatory Visit | Attending: Interventional Cardiology | Admitting: Interventional Cardiology

## 2019-10-18 ENCOUNTER — Other Ambulatory Visit: Payer: Self-pay

## 2019-10-18 DIAGNOSIS — I25708 Atherosclerosis of coronary artery bypass graft(s), unspecified, with other forms of angina pectoris: Secondary | ICD-10-CM | POA: Insufficient documentation

## 2019-10-18 DIAGNOSIS — R072 Precordial pain: Secondary | ICD-10-CM | POA: Insufficient documentation

## 2019-10-18 MED ORDER — IOHEXOL 350 MG/ML SOLN
80.0000 mL | Freq: Once | INTRAVENOUS | Status: AC | PRN
  Administered 2019-10-18: 80 mL via INTRAVENOUS

## 2019-10-18 MED ORDER — NITROGLYCERIN 0.4 MG SL SUBL
SUBLINGUAL_TABLET | SUBLINGUAL | Status: AC
  Administered 2019-10-18: 0.8 mg
  Filled 2019-10-18: qty 2

## 2019-10-26 ENCOUNTER — Telehealth: Payer: Self-pay

## 2019-10-26 NOTE — Telephone Encounter (Signed)
Pt called to schedule his procedure. Pt states that his procedure was cancelled due to him needing cardiac clearance. Pt is ready to schedule at this time.

## 2019-10-30 ENCOUNTER — Encounter: Payer: Self-pay | Admitting: *Deleted

## 2019-10-30 NOTE — Telephone Encounter (Addendum)
Called pt. He has been rescheduled for procedure to 10/12 at 2:45pm. Patient aware will mail new instructions with new pre-op/covid test appt. Confirmed mailing address is correct.   PA is still valid  PA approved for EGD/ED. Auth# [id number] dates 10/16/2019-01/14/2020

## 2019-11-28 NOTE — Patient Instructions (Signed)
54    Your procedure is scheduled on: 12/04/2019  Report to [facility] at     12:45 M.  Call this number if you have problems the morning of surgery: [phone]   Remember:   Follow instructions on letter from office regarding when to stop eating and drinking        No Smoking the day of procedure      Take these medicines the morning of surgery with A SIP OF WATER: Amlodipine, Dexilant, Zyrtec, Zetia, issorbide, metoprolol, and zoloft              No diabetic medication am of procedure   Do not wear jewelry, make-up or nail polish.  Do not wear lotions, powders, or perfumes. You may wear deodorant.                Do not bring valuables to the hospital.  Contacts, dentures or bridgework may not be worn into surgery.  Leave suitcase in the car. After surgery it may be brought to your room.  For patients admitted to the hospital, checkout time is 11:00 AM the day of discharge.   Patients discharged the day of surgery will not be allowed to drive home. Upper Endoscopy, Adult Upper endoscopy is a procedure to look inside the upper GI (gastrointestinal) tract. The upper GI tract is made up of:  The part of the body that moves food from your mouth to your stomach (esophagus).  The stomach.  The first part of your small intestine (duodenum). This procedure is also called esophagogastroduodenoscopy (EGD) or gastroscopy. In this procedure, your health care provider passes a thin, flexible tube (endoscope) through your mouth and down your esophagus into your stomach. A small camera is attached to the end of the tube. Images from the camera appear on a monitor in the exam room. During this procedure, your health care provider may also remove a small piece of tissue to be sent to a lab and examined under a microscope (biopsy). Your health care provider may do an upper endoscopy to diagnose cancers of the upper GI tract. You may also have this procedure to find the cause of other conditions, such  as:  Stomach pain.  Heartburn.  Pain or problems when swallowing.  Nausea and vomiting.  Stomach bleeding.  Stomach ulcers. Tell a health care provider about:  Any allergies you have.  All medicines you are taking, including vitamins, herbs, eye drops, creams, and over-the-counter medicines.  Any problems you or family members have had with anesthetic medicines.  Any blood disorders you have.  Any surgeries you have had.  Any medical conditions you have.  Whether you are pregnant or may be pregnant. What are the risks? Generally, this is a safe procedure. However, problems may occur, including:  Infection.  Bleeding.  Allergic reactions to medicines.  A tear or hole (perforation) in the esophagus, stomach, or duodenum. What happens before the procedure? Staying hydrated Follow instructions from your health care provider about hydration, which may include:  Up to 2 hours before the procedure - you may continue to drink clear liquids, such as water, clear fruit juice, black coffee, and plain tea.  Eating and drinking restrictions Follow instructions from your health care provider about eating and drinking, which may include:  8 hours before the procedure - stop eating heavy meals or foods, such as meat, fried foods, or fatty foods.  6 hours before the procedure - stop eating light meals or foods, such  as toast or cereal.  6 hours before the procedure - stop drinking milk or drinks that contain milk.  2 hours before the procedure - stop drinking clear liquids. Medicines Ask your health care provider about:  Changing or stopping your regular medicines. This is especially important if you are taking diabetes medicines or blood thinners.  Taking medicines such as aspirin and ibuprofen. These medicines can thin your blood. Do not take these medicines unless your health care provider tells you to take them.  Taking over-the-counter medicines, vitamins, herbs, and  supplements. General instructions  Plan to have someone take you home from the hospital or clinic.  If you will be going home right after the procedure, plan to have someone with you for 24 hours.  Ask your health care provider what steps will be taken to help prevent infection. What happens during the procedure?  1. An IV will be inserted into one of your veins. 2. You may be given one or more of the following: ? A medicine to help you relax (sedative). ? A medicine to numb the throat (local anesthetic). 3. You will lie on your left side on an exam table. 4. Your health care provider will pass the endoscope through your mouth and down your esophagus. 5. Your health care provider will use the scope to check the inside of your esophagus, stomach, and duodenum. Biopsies may be taken. 6. The endoscope will be removed. The procedure may vary among health care providers and hospitals. What happens after the procedure?  Your blood pressure, heart rate, breathing rate, and blood oxygen level will be monitored until you leave the hospital or clinic.  Do not drive for 24 hours if you were given a sedative during your procedure.  When your throat is no longer numb, you may be given some fluids to drink.  It is up to you to get the results of your procedure. Ask your health care provider, or the department that is doing the procedure, when your results will be ready. Summary  Upper endoscopy is a procedure to look inside the upper GI tract.  During the procedure, an IV will be inserted into one of your veins. You may be given a medicine to help you relax.  A medicine will be used to numb your throat.  The endoscope will be passed through your mouth and down your esophagus. This information is not intended to replace advice given to you by your health care provider. Make sure you discuss any questions you have with your health care provider. Document Revised: 08/03/2017 Document Reviewed:  07/11/2017 Elsevier Patient Education  [place] After  Please read the instructions outlined below and refer to this sheet in  the next few weeks. These discharge instructions provide you with general information on caring for yourself after you leave the hospital. Your doctor may also give you specific instructions. While your treatment has been planned according to the most current medical practices available, unavoidable complications occasionally occur. If you have any problems or questions after discharge, please call your doctor. HOME CARE INSTRUCTIONS Activity  You may resume your regular activity but move at a slower pace for the next 24 hours.   Take frequent rest periods for the next 24 hours.   Walking will help expel (get rid of) the air and reduce the bloated feeling in your abdomen.   No driving for 24 hours (because of the anesthesia (medicine) used during the test).   You may shower.   Do not sign any important legal documents or operate any machinery for 24 hours (because of the anesthesia used during the test).  Nutrition  Drink plenty of fluids.   You may resume your normal diet.   Begin with a light meal and progress to your normal diet.   Avoid alcoholic beverages for 24 hours or as instructed by your caregiver.  Medications You may resume your normal medications unless your caregiver tells you otherwise. What you can expect today  You may experience abdominal discomfort such as a feeling of fullness or "gas" pains.   You may experience a sore throat for 2 to 3 days. This is normal. Gargling with salt water may help this.  Follow-up Your doctor will discuss the results of your test with you. SEEK IMMEDIATE MEDICAL CARE IF:  You have excessive nausea (feeling sick to your stomach) and/or vomiting.    You have severe abdominal pain and distention (swelling).   You have trouble swallowing.   You have a temperature over 100 F (37.8 C).   You have rectal bleeding or vomiting of blood.  Document Released: 09/23/2003 Document Revised: 01/28/2011 Document Reviewed: 04/05/2007

## 2019-11-30 ENCOUNTER — Other Ambulatory Visit (HOSPITAL_COMMUNITY): Payer: Medicare Other

## 2019-11-30 ENCOUNTER — Other Ambulatory Visit (HOSPITAL_COMMUNITY)
Admission: RE | Admit: 2019-11-30 | Discharge: 2019-11-30 | Disposition: A | Discharge status: Home / Self Care | Payer: Medicare Other | Source: Ambulatory Visit | Attending: Internal Medicine | Admitting: Internal Medicine

## 2019-11-30 ENCOUNTER — Other Ambulatory Visit: Payer: Self-pay

## 2019-11-30 ENCOUNTER — Encounter (HOSPITAL_COMMUNITY): Payer: Self-pay

## 2019-11-30 ENCOUNTER — Encounter (HOSPITAL_COMMUNITY)
Admission: RE | Admit: 2019-11-30 | Discharge: 2019-11-30 | Disposition: A | Discharge status: Home / Self Care | Payer: Medicare Other | Source: Ambulatory Visit | Attending: Internal Medicine | Admitting: Internal Medicine

## 2019-11-30 DIAGNOSIS — Z01812 Encounter for preprocedural laboratory examination: Secondary | ICD-10-CM | POA: Insufficient documentation

## 2019-11-30 DIAGNOSIS — Z20822 Contact with and (suspected) exposure to covid-19: Secondary | ICD-10-CM | POA: Diagnosis not present

## 2019-11-30 LAB — BASIC METABOLIC PANEL
Anion gap: 11 (ref 5–15)
BUN: 6 mg/dL (ref 6–20)
CO2: 25 mmol/L (ref 22–32)
Calcium: 9.6 mg/dL (ref 8.9–10.3)
Chloride: 101 mmol/L (ref 98–111)
Creatinine, Ser: 0.82 mg/dL (ref 0.61–1.24)
GFR calc non Af Amer: >60 mL/min (ref >60)
Glucose, Bld: 102 mg/dL — ABNORMAL HIGH (ref 70–99)
Potassium: 3.5 mmol/L (ref 3.5–5.1)
Sodium: 137 mmol/L (ref 135–145)

## 2019-11-30 LAB — SARS CORONAVIRUS 2 (TAT 6-24 HRS): SARS Coronavirus 2: NEGATIVE

## 2019-12-04 ENCOUNTER — Encounter (HOSPITAL_COMMUNITY): Payer: Self-pay

## 2019-12-04 ENCOUNTER — Encounter (HOSPITAL_COMMUNITY)
Admission: RE | Disposition: A | Discharge status: Home / Self Care | Payer: Self-pay | Source: Home / Self Care | Attending: Internal Medicine

## 2019-12-04 ENCOUNTER — Ambulatory Visit (HOSPITAL_COMMUNITY): Payer: Medicare Other | Admitting: Anesthesiology

## 2019-12-04 ENCOUNTER — Ambulatory Visit (HOSPITAL_COMMUNITY)
Admission: RE | Admit: 2019-12-04 | Discharge: 2019-12-04 | Disposition: A | Discharge status: Home / Self Care | Payer: Medicare Other | Attending: Internal Medicine | Admitting: Internal Medicine

## 2019-12-04 DIAGNOSIS — I110 Hypertensive heart disease with heart failure: Secondary | ICD-10-CM | POA: Insufficient documentation

## 2019-12-04 DIAGNOSIS — R1310 Dysphagia, unspecified: Secondary | ICD-10-CM | POA: Diagnosis present

## 2019-12-04 DIAGNOSIS — M069 Rheumatoid arthritis, unspecified: Secondary | ICD-10-CM | POA: Insufficient documentation

## 2019-12-04 DIAGNOSIS — J449 Chronic obstructive pulmonary disease, unspecified: Secondary | ICD-10-CM | POA: Insufficient documentation

## 2019-12-04 DIAGNOSIS — Z79899 Other long term (current) drug therapy: Secondary | ICD-10-CM | POA: Insufficient documentation

## 2019-12-04 DIAGNOSIS — K2970 Gastritis, unspecified, without bleeding: Secondary | ICD-10-CM

## 2019-12-04 DIAGNOSIS — I509 Heart failure, unspecified: Secondary | ICD-10-CM | POA: Diagnosis not present

## 2019-12-04 DIAGNOSIS — Z7901 Long term (current) use of anticoagulants: Secondary | ICD-10-CM | POA: Insufficient documentation

## 2019-12-04 DIAGNOSIS — I252 Old myocardial infarction: Secondary | ICD-10-CM | POA: Diagnosis not present

## 2019-12-04 DIAGNOSIS — K2950 Unspecified chronic gastritis without bleeding: Secondary | ICD-10-CM | POA: Insufficient documentation

## 2019-12-04 DIAGNOSIS — Z86718 Personal history of other venous thrombosis and embolism: Secondary | ICD-10-CM | POA: Diagnosis not present

## 2019-12-04 DIAGNOSIS — E1151 Type 2 diabetes mellitus with diabetic peripheral angiopathy without gangrene: Secondary | ICD-10-CM | POA: Diagnosis not present

## 2019-12-04 DIAGNOSIS — Z7984 Long term (current) use of oral hypoglycemic drugs: Secondary | ICD-10-CM | POA: Insufficient documentation

## 2019-12-04 DIAGNOSIS — I2510 Atherosclerotic heart disease of native coronary artery without angina pectoris: Secondary | ICD-10-CM | POA: Diagnosis not present

## 2019-12-04 DIAGNOSIS — Z8711 Personal history of peptic ulcer disease: Secondary | ICD-10-CM | POA: Diagnosis not present

## 2019-12-04 DIAGNOSIS — Z886 Allergy status to analgesic agent status: Secondary | ICD-10-CM | POA: Diagnosis not present

## 2019-12-04 DIAGNOSIS — Z951 Presence of aortocoronary bypass graft: Secondary | ICD-10-CM | POA: Insufficient documentation

## 2019-12-04 DIAGNOSIS — K222 Esophageal obstruction: Secondary | ICD-10-CM | POA: Insufficient documentation

## 2019-12-04 DIAGNOSIS — F319 Bipolar disorder, unspecified: Secondary | ICD-10-CM | POA: Insufficient documentation

## 2019-12-04 DIAGNOSIS — Z888 Allergy status to other drugs, medicaments and biological substances status: Secondary | ICD-10-CM | POA: Insufficient documentation

## 2019-12-04 DIAGNOSIS — E1140 Type 2 diabetes mellitus with diabetic neuropathy, unspecified: Secondary | ICD-10-CM | POA: Diagnosis not present

## 2019-12-04 DIAGNOSIS — K319 Disease of stomach and duodenum, unspecified: Secondary | ICD-10-CM | POA: Insufficient documentation

## 2019-12-04 DIAGNOSIS — K219 Gastro-esophageal reflux disease without esophagitis: Secondary | ICD-10-CM | POA: Insufficient documentation

## 2019-12-04 HISTORY — PX: ESOPHAGOGASTRODUODENOSCOPY (EGD) WITH PROPOFOL: SHX5813

## 2019-12-04 HISTORY — PX: BALLOON DILATION: SHX5330

## 2019-12-04 HISTORY — PX: BIOPSY: SHX5522

## 2019-12-04 LAB — GLUCOSE, CAPILLARY
Glucose-Capillary: 117 mg/dL — ABNORMAL HIGH (ref 70–99)
Glucose-Capillary: 89 mg/dL (ref 70–99)

## 2019-12-04 SURGERY — ESOPHAGOGASTRODUODENOSCOPY (EGD) WITH PROPOFOL
Anesthesia: General

## 2019-12-04 MED ORDER — PROPOFOL 10 MG/ML IV BOLUS
INTRAVENOUS | Status: DC | PRN
  Administered 2019-12-04: 100 mg via INTRAVENOUS
  Administered 2019-12-04 (×2): 50 mg via INTRAVENOUS

## 2019-12-04 MED ORDER — LIDOCAINE VISCOUS HCL 2 % MT SOLN
OROMUCOSAL | Status: AC
  Filled 2019-12-04: qty 15

## 2019-12-04 MED ORDER — CHLORHEXIDINE GLUCONATE CLOTH 2 % EX PADS: 6.0000 | MEDICATED_PAD | Freq: Once | CUTANEOUS | Status: DC

## 2019-12-04 MED ORDER — LACTATED RINGERS IV SOLN: INTRAVENOUS | Status: DC | PRN

## 2019-12-04 MED ORDER — LIDOCAINE HCL (CARDIAC) PF 100 MG/5ML IV SOSY
PREFILLED_SYRINGE | INTRAVENOUS | Status: DC | PRN
  Administered 2019-12-04: 50 mg via INTRATRACHEAL

## 2019-12-04 MED ORDER — LACTATED RINGERS IV SOLN: Freq: Once | INTRAVENOUS | Status: AC

## 2019-12-04 MED ORDER — GLYCOPYRROLATE 0.2 MG/ML IJ SOLN
INTRAMUSCULAR | Status: AC
  Filled 2019-12-04: qty 1

## 2019-12-04 MED ORDER — LIDOCAINE VISCOUS HCL 2 % MT SOLN
15.0000 mL | Freq: Once | OROMUCOSAL | Status: AC
  Administered 2019-12-04: 15 mL via OROMUCOSAL

## 2019-12-04 NOTE — Anesthesia Preprocedure Evaluation (Signed)
Anesthesia Evaluation  Patient identified by MRN, date of birth, ID band Patient awake    Reviewed: Allergy & Precautions, NPO status , Patient's Chart, lab work & pertinent test results  History of Anesthesia Complications Negative for: history of anesthetic complications  Airway Mallampati: II  TM Distance: >3 FB  Positive for:  Tracheal deviation  Comment: ACDF Dental  (+) Edentulous Upper, Edentulous Lower   Pulmonary shortness of breath and with exertion, asthma , COPD,    Pulmonary exam normal breath sounds clear to auscultation       Cardiovascular hypertension, Pt. on medications + CAD, + Past MI, + Peripheral Vascular Disease, +CHF and + DVT  + dysrhythmias  Rhythm:Irregular Rate:Abnormal     Neuro/Psych  Headaches, Seizures -, Well Controlled,  PSYCHIATRIC DISORDERS Anxiety Depression Bipolar Disorder  Neuromuscular disease    GI/Hepatic PUD, GERD  ,  Endo/Other  diabetes  Renal/GU      Musculoskeletal  (+) Arthritis , Cervical spinal stenosis    Abdominal   Peds  Hematology  (+) anemia ,   Anesthesia Other Findings   Reproductive/Obstetrics                             Anesthesia Physical Anesthesia Plan  ASA: III  Anesthesia Plan: General   Post-op Pain Management:    Induction: Intravenous  PONV Risk Score and Plan:   Airway Management Planned: Nasal Cannula and Natural Airway  Additional Equipment:   Intra-op Plan:   Post-operative Plan:   Informed Consent: I have reviewed the patients History and Physical, chart, labs and discussed the procedure including the risks, benefits and alternatives for the proposed anesthesia with the patient or authorized representative who has indicated his/her understanding and acceptance.       Plan Discussed with: CRNA and Surgeon  Anesthesia Plan Comments:         Anesthesia Quick Evaluation

## 2019-12-04 NOTE — Transfer of Care (Signed)
Immediate Anesthesia Transfer of Care Note  Patient: Dalton [name]  Procedure(s) Performed: ESOPHAGOGASTRODUODENOSCOPY (EGD) WITH PROPOFOL (N/A ) BALLOON DILATION (N/A ) BIOPSY  Patient Location: PACU  Anesthesia Type:General  Level of Consciousness: awake, alert  and oriented  Airway & Oxygen Therapy: Patient Spontanous Breathing  Post-op Assessment: Report given to RN, Post -op Vital signs reviewed and stable and Patient moving all extremities X 4  Post vital signs: Reviewed and stable  Last Vitals:  Vitals Value Taken Time  BP    Temp    Pulse 80 12/04/19 1409  Resp 16 12/04/19 1409  SpO2 99 % 12/04/19 1409  Vitals shown include unvalidated device data.  Last Pain:  Vitals:   12/04/19 1346  PainSc: 0-No pain         Complications: No complications documented.

## 2019-12-04 NOTE — Anesthesia Postprocedure Evaluation (Signed)
Anesthesia Post Note  Patient: Dalton [name]  Procedure(s) Performed: ESOPHAGOGASTRODUODENOSCOPY (EGD) WITH PROPOFOL (N/A ) BALLOON DILATION (N/A ) BIOPSY  Patient location during evaluation: Endoscopy Anesthesia Type: General Level of consciousness: awake and alert Pain management: pain level controlled Vital Signs Assessment: post-procedure vital signs reviewed and stable Respiratory status: spontaneous breathing, nonlabored ventilation, respiratory function stable and patient connected to nasal cannula oxygen Cardiovascular status: blood pressure returned to baseline and stable Postop Assessment: no apparent nausea or vomiting Anesthetic complications: no   No complications documented.   Last Vitals:  Vitals:   12/04/19 1408  BP: (P) 119/69  Pulse: 83  Resp: 16  Temp: (P) 36.6 C  SpO2: 97%    Last Pain:  Vitals:   12/04/19 1346  PainSc: 0-No pain                 Adair Patter Cleophas Yoak

## 2019-12-04 NOTE — Addendum Note (Signed)
Addendum  created 12/04/19 1420 by Lyda Jester, CRNA   Charge Capture section accepted

## 2019-12-04 NOTE — H&P (Signed)
Primary Care Physician:  Nolene Ebbs, MD Primary Gastroenterologist:  Dr. Abbey Chatters  Pre-Procedure History & Physical: HPI:  Dalton [name] is a 59 y.o. male is here for an EGD for dysphagia   Past Medical History:  Diagnosis Date  . Anemia   . Anxiety   . Asthma   . Bipolar disorder ([place])   . CHF (congestive heart failure) ([place])   . Chronic back pain    Pain Clinic in [place]  . Chronic bronchitis   . Chronic lower back pain   . Congestive heart failure (CHF) ([place])   . COPD (chronic obstructive pulmonary disease) ([place])    per patient 03/05/19  . Coronary artery disease   . Coughing   . Depression   . DVT (deep venous thrombosis) ([place]) ~ 2005   LLE  . Frequency of urination   . GERD (gastroesophageal reflux disease)   . 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 WUXLKGMW(102.7)    "a few times/week" (12/24/2015)  . High cholesterol   . HNP (herniated nucleus pulposus), cervical   . Hypertension   . Laceration of right hand 11/27/2010  . Laceration of wrist 2007 BIL FOREARMS  . MI (myocardial infarction) ([place])    7, last one was in 2011 (12/24/2015)  . Neuromuscular disorder (H[facility])    legs/feet  . Neuropathy    legs/feet  . NSAID-induced gastric ulcer    "Ibuprofen"  . PUD (peptic ulcer disease)    in 1990s, secondary to medication  . Rheumatoid arthritis ([place])    "all over" (12/24/2015)  . Shortness of breath    with exertion  . Tonsillitis, chronic    Dr. Vicki Mallet in [place]  . Type II diabetes mellitus ([place])   . Wears glasses     Past Surgical History:  Procedure Laterality Date  . ANTERIOR CERVICAL DECOMP/DISCECTOMY FUSION N/A 11/05/2016   Procedure: Anterior Cervical Discectomy and Fusion - Cervical three-Cervical four;  Surgeon: Earnie Larsson, MD;  Location: [facility];  Service: Neurosurgery;  Laterality: N/A;  . BACK SURGERY     x3  . BIOPSY N/A 05/30/2012   Procedure: BIOPSY;  Surgeon: Danie Binder, MD;  Location: [facility];  Service: Endoscopy;  Laterality: N/A;  . BIOPSY  03/02/2016   Procedure: BIOPSY;  Surgeon: Danie Binder, MD;  [facility];  Service: Endoscopy;;  gastric  . CARDIAC CATHETERIZATION  "several"  . CARPAL TUNNEL RELEASE Bilateral   . CATARACT EXTRACTION W/PHACO Right 06/15/2016   Procedure: CATARACT EXTRACTION PHACO AND INTRAOCULAR LENS PLACEMENT (IOC);  Surgeon: Rutherford Guys, MD;  Location: [facility];  Service: Ophthalmology;  Laterality: Right;  CDE: 4.64  . CATARACT EXTRACTION W/PHACO Left 07/13/2016   Procedure: CATARACT EXTRACTION PHACO AND INTRAOCULAR LENS PLACEMENT (IOC);  Surgeon: Rutherford Guys, MD;  Location: [facility];  Service: Ophthalmology;  Laterality: Left;  CDE: 3.15  . COLONOSCOPY  12/28/2010   SLF: (MAC)Internal hemorrhoids/four small colon polyps tubular adenomas. per SLF: colonoscopy 2022  . COLONOSCOPY WITH PROPOFOL N/A 07/05/2017   Procedure: COLONOSCOPY WITH PROPOFOL;  Surgeon: Danie Binder, MD;  [facility];  Service: Endoscopy;  Laterality: N/A;  7:30am  . CORONARY ARTERY BYPASS GRAFT  2002   3 vessels  . ESOPHAGOGASTRODUODENOSCOPY N/A 05/30/2012   SLF: UNCONTROLLED GERD DUE TO LIFESTYLE CHOICE/WEIGHT GAIN/MILD Non-erosive gastritis  . ESOPHAGOGASTRODUODENOSCOPY (EGD) WITH PROPOFOL N/A 03/02/2016   Dr. Oneida Alar: Esophagus appeared normal, impaired  dilation performed, patchy inflammation with edema and erythema of the entire stomach., Biopsy with H pylori, patient completed Pylera.   [name] Kitchen FRACTURE SURGERY    . LEFT HEART CATHETERIZATION WITH CORONARY ANGIOGRAM N/A 08/31/2011   Procedure: LEFT HEART CATHETERIZATION WITH CORONARY ANGIOGRAM;  Surgeon: Laverda Page, MD;  Location: [facility] CATH LAB;  Service: Cardiovascular;  Laterality: N/A;  . LUMBAR DISC SURGERY     "L4-5; Dr. Trenton Gammon"  . LUMBAR LAMINECTOMY/DECOMPRESSION MICRODISCECTOMY Right 03/06/2019   Procedure: MICRODISCECTOMY EXTRAFORAMINAL LUMBAR THREE - LUMBAR FOUR RIGHT;   Surgeon: Earnie Larsson, MD;  Location: [facility];  Service: Neurosurgery;  Laterality: Right;  MICRODISCECTOMY EXTRAFORAMINAL LUMBAR THREE - LUMBAR FOUR RIGHT  . MULTIPLE TOOTH EXTRACTIONS    . ORIF MANDIBULAR FRACTURE N/A 12/19/2015   Procedure: OPEN REDUCTION INTERNAL FIXATION (ORIF) MANDIBULAR FRACTURE;  Surgeon: Izora Gala, MD;  Location: [place];  Service: ENT;  Laterality: N/A;  . PATELLA FRACTURE SURGERY Left 1976   plate to knee cap from accident  . POLYPECTOMY  07/05/2017   Procedure: POLYPECTOMY;  Surgeon: Danie Binder, MD;  [facility];  Service: Endoscopy;;  colon  . SAVORY DILATION  12/28/2010   SLF:(MAC)J-shaped stomach/nodular mocosa in the distal esophagus/empiric dilation 14m  . SAVORY DILATION N/A 03/02/2016   Procedure: SAVORY DILATION;  Surgeon: SDanie Binder MD;  [facility];  Service: Endoscopy;  Laterality: N/A;    Prior to Admission medications   Medication Sig Start Date End Date Taking? Authorizing Provider  albuterol (PROVENTIL HFA;VENTOLIN HFA) 108 (90 Base) MCG/ACT inhaler Inhale 2 puffs into the lungs every 6 (six) hours as needed for wheezing or shortness of breath.    Yes [provider]  albuterol (PROVENTIL) (2.5 MG/3ML) 0.083% nebulizer solution Take 2.5 mg by nebulization every 6 (six) hours as needed for wheezing or shortness of breath.    Yes [provider]  ALPRAZolam (Duanne Moron 1 MG tablet Take 1 mg by mouth 3 (three) times daily.  08/01/19  Yes [provider]  amLODipine (NORVASC) 5 MG tablet Take 1 tablet (5 mg total) by mouth daily. 02/07/19 11/20/19 Yes Branch,Alphonse Guild MD  atorvastatin (LIPITOR) 40 MG tablet Take 40 mg by mouth at bedtime. 10/15/19  Yes [provider]  beclomethasone (QVAR) 80 MCG/ACT inhaler Inhale 2 puffs into the lungs 2 (two) times daily as needed (respiratory issues.).    Yes [provider]  BELSOMRA 15 MG TABS Take 15 mg by mouth at bedtime.  05/11/19  Yes [provider]  cetirizine (ZYRTEC) 10 MG tablet Take 10 mg by mouth daily as needed for allergies.  01/26/16  Yes [provider]  dexlansoprazole (DEXILANT) 60 MG capsule Take 1 capsule (60 mg total) by mouth daily. 07/05/17  Yes Fields, Sandi L, MD  Evolocumab (REPATHA SURECLICK) 1096MG/ML SOAJ Inject 140 mg into the skin every 14 (fourteen) days. 04/05/19  Yes Branch,Alphonse Guild MD  ezetimibe (ZETIA) 10 MG tablet Take 10 mg by mouth daily.   Yes [provider]  fluticasone (FLONASE) 50 MCG/ACT nasal spray Place 2 sprays into both nostrils daily.    Yes [provider]  Fluticasone Furoate (ARNUITY ELLIPTA) 200 MCG/ACT AEPB Inhale 1 puff into the lungs daily as needed (shortness of breath).    Yes [provider]  furosemide (LASIX) 40 MG tablet TAKE 1 TABLET(40 MG) BY MOUTH DAILY Patient taking differently: Take 40 mg by mouth daily.  10/12/19  Yes BArnoldo Lenis  MD  glipiZIDE (GLUCOTROL) 10 MG tablet Take 10 mg by mouth daily before breakfast.   Yes [provider]  hydrochlorothiazide (HYDRODIURIL) 25 MG tablet Take 25 mg by mouth daily.   Yes [provider]  ipratropium (ATROVENT) 0.02 % nebulizer solution Take 0.5 mg by nebulization 2 (two) times daily as needed for wheezing or shortness of breath (for congestion).    Yes [provider]  isosorbide mononitrate (IMDUR) 30 MG 24 hr tablet Take 0.5 tablets (15 mg total) by mouth at bedtime. 08/14/19 11/20/19 Yes Burtis Junes, NP  ketoconazole (NIZORAL) 2 % shampoo Apply 1 application topically daily as needed for irritation.  10/23/19  Yes [provider]  LINZESS 290 MCG CAPS capsule TAKE 1 CAPSULE(290 MCG) BY MOUTH DAILY BEFORE BREAKFAST Patient taking differently: Take 290 mcg by mouth daily before breakfast.  11/01/18  Yes Mahala Menghini, PA-C  lisinopril (PRINIVIL,ZESTRIL) 10 MG tablet Take 10 mg by mouth daily.   Yes [provider]  Liver Extract (LIVER  PO) Take 1 tablet by mouth daily. "Liver Aid"   Yes [provider]  magnesium citrate SOLN Take 1 Bottle by mouth as needed for moderate constipation.    Yes [provider]  metFORMIN (GLUCOPHAGE-XR) 500 MG 24 hr tablet Take 500 mg by mouth daily with breakfast.    Yes [provider]  metoprolol tartrate (LOPRESSOR) 25 MG tablet Take 0.5 tablets (12.5 mg total) by mouth 2 (two) times daily. Patient taking differently: Take 12.5 mg by mouth 2 (two) times daily.  01/06/16  Yes Kerrie Buffalo, NP  Multiple Vitamin (MULTIVITAMIN WITH MINERALS) TABS tablet Take 1 tablet by mouth daily.   Yes [provider]  Multiple Vitamins-Minerals (EMERGEN-C IMMUNE) PACK Take 1 packet by mouth daily.   Yes [provider]  naloxegol oxalate (MOVANTIK) 25 MG TABS tablet Take 1 tablet (25 mg total) by mouth daily. 08/21/19  Yes Carlis Stable, NP  oxyCODONE-acetaminophen (PERCOCET) 10-325 MG tablet Take 1 tablet by mouth every 4 (four) hours as needed for pain.   Yes [provider]  potassium chloride SA (KLOR-CON) 20 MEQ tablet Take 20 mEq by mouth 2 (two) times daily.   Yes [provider]  sertraline (ZOLOFT) 100 MG tablet Take 100 mg by mouth daily.   Yes [provider]  sitaGLIPtin (JANUVIA) 100 MG tablet Take 100 mg by mouth daily.   Yes [provider]  tamsulosin (FLOMAX) 0.4 MG CAPS capsule Take 1 capsule (0.4 mg total) by mouth daily after breakfast. 01/07/16  Yes Kerrie Buffalo, NP  Vitamin D, Ergocalciferol, (DRISDOL) 1.25 MG (50000 UT) CAPS capsule Take 50,000 Units by mouth every 7 (seven) days. Monday   Yes [provider]  aspirin-sod bicarb-citric acid (ALKA-SELTZER) 325 MG TBEF tablet Take 650 mg by mouth every 6 (six) hours as needed (immune support). Patient not taking: Reported on 11/20/2019    [provider]  atorvastatin (LIPITOR) 80 MG tablet Take 1 tablet (80 mg total) by mouth daily. Patient  not taking: Reported on 11/20/2019 09/21/17   End, Harrell Gave, MD  clopidogrel (PLAVIX) 75 MG tablet Take 75 mg by mouth daily.    [provider]  NARCAN 4 MG/0.1ML LIQD nasal spray kit Place 1 spray into the nose as needed (opioid overdose).  06/15/17   [provider]  nitroGLYCERIN (NITROSTAT) 0.4 MG SL tablet SEE NOTES Patient taking differently: Place 0.4 mg under the tongue every 5 (five) minutes as needed  for chest pain.  07/07/18   Arnoldo Lenis, MD    Allergies as of 10/30/2019 - Review Complete 10/12/2019  Allergen Reaction Noted  . Gabapentin Hives   . Ibuprofen Other (See Comments)   . Zolpidem tartrate Other (See Comments)   . Naproxen Other (See Comments)     Family History  Problem Relation Age of Onset  . Diabetes Mother   . Hypertension Mother   . Heart attack Mother 77  . Hypertension Father   . Diabetes Father   . Heart attack Father 80  . Heart attack Other        mother, father, brother, sister all deceased due to MI  . Heart attack Sister   . Heart attack Brother   . Seizures Brother   . Heart failure Other   . Colon cancer Neg Hx   . Liver disease Neg Hx   . Anesthesia problems Neg Hx   . Hypotension Neg Hx   . Malignant hyperthermia Neg Hx   . Pseudochol deficiency Neg Hx   . Colon polyps Neg Hx     Social History   Socioeconomic History  . Marital status: Single    Spouse name: Not on file  . Number of children: 2  . Years of education: Not on file  . Highest education level: Not on file  Occupational History  . Occupation: disabled    Fish farm manager: NOT EMPLOYED  Tobacco Use  . Smoking status: Never Smoker  . Smokeless tobacco: Never Used  Vaping Use  . Vaping Use: Never used  Substance and Sexual Activity  . Alcohol use: No    Alcohol/week: 0.0 standard drinks  . 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 Sexual activity: Not Currently   Other Topics Concern  . Not on file  Social History Narrative   Lives w/ son-23/22   Social Determinants of Health   Financial Resource Strain:   . Difficulty of Paying Living Expenses: Not on file  Food Insecurity:   . Worried About Charity fundraiser in the Last Year: Not on file  . Ran Out of Food in the Last Year: Not on file  Transportation Needs:   . Lack of Transportation (Medical): Not on file  . Lack of Transportation (Non-Medical): Not on file  Physical Activity:   . Days of Exercise per Week: Not on file  . Minutes of Exercise per Session: Not on file  Stress:   . Feeling of Stress : Not on file  Social Connections:   . Frequency of Communication with Friends and Family: Not on file  . Frequency of Social Gatherings with Friends and Family: Not on file  . Attends Religious Services: Not on file  . Active Member of Clubs or Organizations: Not on file  . Attends Archivist Meetings: Not on file  . Marital Status: Not on file  Intimate Partner Violence:   . Fear of Current or Ex-Partner: Not on file  . Emotionally Abused: Not on file  . Physically Abused: Not on file  . Sexually Abused: Not on file    Review of Systems: See HPI, otherwise negative ROS  Impression/Plan: Dalton [name] is here for an EGD due to dysphagia  Risks, benefits, limitations, imponderables and alternatives regarding EGD have been reviewed with the patient. Questions have been answered. All parties agreeable.

## 2019-12-04 NOTE — Discharge Instructions (Addendum)
EGD Discharge instructions Please read the instructions outlined below and refer to this sheet in the next few weeks. These discharge instructions provide you with general information on caring for yourself after you leave the hospital. Your doctor may also give you specific instructions. While your treatment has been planned according to the most current medical practices available, unavoidable complications occasionally occur. If you have any problems or questions after discharge, please call your doctor. ACTIVITY  You may resume your regular activity but move at a slower pace for the next 24 hours.   Take frequent rest periods for the next 24 hours.   Walking will help expel (get rid of) the air and reduce the bloated feeling in your abdomen.   No driving for 24 hours (because of the anesthesia (medicine) used during the test).   You may shower.   Do not sign any important legal documents or operate any machinery for 24 hours (because of the anesthesia used during the test).  NUTRITION  Drink plenty of fluids.   You may resume your normal diet.   Begin with a light meal and progress to your normal diet.   Avoid alcoholic beverages for 24 hours or as instructed by your caregiver.  MEDICATIONS  You may resume your normal medications unless your caregiver tells you otherwise.  WHAT YOU CAN EXPECT TODAY  You may experience abdominal discomfort such as a feeling of fullness or "gas" pains.  FOLLOW-UP  Your doctor will discuss the results of your test with you.  SEEK IMMEDIATE MEDICAL ATTENTION IF ANY OF THE FOLLOWING OCCUR:  Excessive nausea (feeling sick to your stomach) and/or vomiting.   Severe abdominal pain and distention (swelling).   Trouble swallowing.   Temperature over 101 F (37.8 C).   Rectal bleeding or vomiting of blood.   Your EGD showed inflammation in your stomach.  I biopsied this to rule out infection with H. pylori.  Await pathology results, my office  will contact you.  Continue on Dexilant 60 mg daily.  Avoid NSAIDs as best you can.  I also dilated your esophagus which should hopefully help with your swallowing.  We may need to repeat this in the future if your symptoms return.  Follow-up with GI in 2 to 3 months if your symptoms do not resolve.  I hope you have a great rest of your week!  [place]on Alas. Abbey Chatters, D.O. Gastroenterology and Hepatology [facility]

## 2019-12-04 NOTE — Op Note (Addendum)
[facility] Patient Name: Dalton [name] Procedure Date: 12/04/2019 1:36 PM MRN: [id number] Date of Birth: 1960/09/09 Attending MD: Elon Alas. Abbey Chatters DO CSN: [id number] Age: 59 Admit Type: Outpatient Procedure:                Upper GI endoscopy Indications:              Dysphagia, Heartburn Providers:                Elon Alas. Abbey Chatters, DO, Lambert Mody, Crystal                            Page, Raphael Gibney, Technician Referring MD:              Medicines:                See the Anesthesia note for documentation of the                            administered medications Complications:            No immediate complications. Estimated Blood Loss:     Estimated blood loss was minimal. Procedure:                Pre-Anesthesia Assessment:                           - The anesthesia plan was to use monitored                            anesthesia care (MAC).                           After obtaining informed consent, the endoscope was                            passed under direct vision. Throughout the                            procedure, the patient's blood pressure, pulse, and                            oxygen saturations were monitored continuously. The                            GIF-H190 ([id number]) scope was introduced through the                            mouth, and advanced to the second part of duodenum.                            The upper GI endoscopy was accomplished without                            difficulty. The patient tolerated the procedure                            well. Scope In: 1:55:03 PM  Scope Out: 1:59:50 PM Total Procedure Duration: 0 hours 4 minutes 47 seconds  Findings:      One benign-appearing, intrinsic mild stenosis was found in the lower       third of the esophagus. The stenosis was traversed. A TTS dilator was       passed through the scope. Dilation with an 18-19-20 mm balloon dilator       was performed to 20 mm. The dilation site was  examined and showed       moderate improvement in luminal narrowing.      Diffuse mild inflammation characterized by erythema was found in the       entire examined stomach. Biopsies were taken with a cold forceps for       Helicobacter pylori testing.      The duodenal bulb, first portion of the duodenum and second portion of       the duodenum were normal. Impression:               - Benign-appearing esophageal stenosis. Dilated.                           - Gastritis. Biopsied.                           - Normal duodenal bulb, first portion of the                            duodenum and second portion of the duodenum. Moderate Sedation:      Per Anesthesia Care Recommendation:           - Patient has a contact number available for                            emergencies. The signs and symptoms of potential                            delayed complications were discussed with the                            patient. Return to normal activities tomorrow.                            Written discharge instructions were provided to the                            patient.                           - Resume previous diet.                           - Continue present medications.                           - Await pathology results.                           - Repeat upper endoscopy PRN for retreatment.                           -  Use Dexilant (dexlansoprazole) 60 mg PO daily. Procedure Code(s):        --- Professional ---                           [phone], Esophagogastroduodenoscopy, flexible,                            transoral; with transendoscopic balloon dilation of                            esophagus (less than 30 mm diameter)                           [id number], 59, Esophagogastroduodenoscopy, flexible,                            transoral; with biopsy, single or multiple Diagnosis Code(s):        --- Professional ---                           K22.2, Esophageal obstruction                            K29.70, Gastritis, unspecified, without bleeding                           R13.10, Dysphagia, unspecified                           R12, Heartburn CPT copyright 2019 American Medical Association. All rights reserved. The codes documented in this report are preliminary and upon coder review may  be revised to meet current compliance requirements. Elon Alas. Abbey Chatters, DO [place] Abbey Chatters, DO 12/04/2019 2:06:40 PM This report has been signed electronically. Number of Addenda: 0

## 2019-12-06 ENCOUNTER — Other Ambulatory Visit: Payer: Self-pay

## 2019-12-06 LAB — SURGICAL PATHOLOGY

## 2019-12-07 ENCOUNTER — Encounter (HOSPITAL_COMMUNITY): Payer: Self-pay | Admitting: Internal Medicine

## 2019-12-10 ENCOUNTER — Other Ambulatory Visit: Payer: Self-pay | Admitting: Gastroenterology

## 2019-12-10 DIAGNOSIS — K5900 Constipation, unspecified: Secondary | ICD-10-CM

## 2019-12-20 ENCOUNTER — Ambulatory Visit: Payer: Medicare Other | Admitting: Nurse Practitioner

## 2020-01-08 ENCOUNTER — Telehealth: Payer: Self-pay

## 2020-01-08 DIAGNOSIS — R1319 Other dysphagia: Secondary | ICD-10-CM

## 2020-01-08 DIAGNOSIS — K219 Gastro-esophageal reflux disease without esophagitis: Secondary | ICD-10-CM

## 2020-01-08 NOTE — Telephone Encounter (Signed)
Pt just phoned to the office was advised that you told him if he continued having trouble with his throat to call us. Pt stated x2 weeks ago he's all chocked up and cant lay down. For past week he's clogged up more and coughing. He has no pain in his throat. Coughing up white phlegm especially if he gargles with warm salt water. No other symptoms reported. Please advise.

## 2020-01-09 NOTE — Addendum Note (Signed)
Addended by: Gordy Levan, Aurilla Coulibaly A on: 01/09/2020 01:17 PM   Modules accepted: Orders

## 2020-01-09 NOTE — Telephone Encounter (Signed)
NOTED

## 2020-01-09 NOTE — Telephone Encounter (Signed)
SPOKE WITH THE PT ADVISED OF THE NOTE FROM THE DR AND HE WOULD BE CONTACTED TO HAVE PROCEDURE . HE AGREED

## 2020-01-09 NOTE — Telephone Encounter (Signed)
Have him eat only soft foods for now. Chew thoroughly. Recent EGD with dilation.  I'll put in for a BPE. May eventually need SLP referral.  If something gets stuck and won't go forward or backward for more than 2 hours, proceed to the ER.

## 2020-01-09 NOTE — Telephone Encounter (Signed)
Routing to [employer] (who saw pt in the office) in [place] absence to review.

## 2020-01-09 NOTE — Telephone Encounter (Signed)
Scheduled for 11/22 at 10:00am, arrival 9:45am, npo midnight Called pt and he is aware of appt details. He voiced understanding.

## 2020-01-14 ENCOUNTER — Other Ambulatory Visit: Payer: Self-pay

## 2020-01-14 ENCOUNTER — Ambulatory Visit (HOSPITAL_COMMUNITY)
Admission: RE | Admit: 2020-01-14 | Discharge: 2020-01-14 | Disposition: A | Discharge status: Home / Self Care | Payer: Medicare Other | Source: Ambulatory Visit | Attending: Nurse Practitioner | Admitting: Nurse Practitioner

## 2020-01-14 DIAGNOSIS — K219 Gastro-esophageal reflux disease without esophagitis: Secondary | ICD-10-CM | POA: Diagnosis present

## 2020-01-14 DIAGNOSIS — R1319 Other dysphagia: Secondary | ICD-10-CM | POA: Insufficient documentation

## 2020-03-06 ENCOUNTER — Encounter: Payer: Self-pay | Admitting: Podiatry

## 2020-03-06 ENCOUNTER — Ambulatory Visit (INDEPENDENT_AMBULATORY_CARE_PROVIDER_SITE_OTHER): Payer: Medicare Other | Admitting: Podiatry

## 2020-03-06 ENCOUNTER — Other Ambulatory Visit: Payer: Self-pay

## 2020-03-06 DIAGNOSIS — L603 Nail dystrophy: Secondary | ICD-10-CM | POA: Diagnosis not present

## 2020-03-06 DIAGNOSIS — E1159 Type 2 diabetes mellitus with other circulatory complications: Secondary | ICD-10-CM

## 2020-03-06 DIAGNOSIS — E1142 Type 2 diabetes mellitus with diabetic polyneuropathy: Secondary | ICD-10-CM | POA: Diagnosis not present

## 2020-03-06 NOTE — Progress Notes (Signed)
  Subjective:  Patient ID: Dalton [name], male    DOB: 1960-06-01,  MRN: [id number]  Chief Complaint  Patient presents with  . Nail Problem    PT stated that he had all his nails removed and he stated that they came back and now they are causing him pain.    60 y.o. male presents with the above complaint. History confirmed with patient.  Nails were removed previously and have regrown and cause pain.  He primarily complains of pain that is burning and shooting in the bottoms of the feet starts in the toes and worse with backward to the heel.  Objective:  Physical Exam: +2 pedal pulses, foot is warm and well-perfused.  He has abnormal sensory exam with loss of protective sensation with a monofilament, subjective paresthesias.  Dystrophic bilateral hallux and left second toenail Assessment:  No diagnosis found.   Plan:  Patient was evaluated and treated and all questions answered.  Discussed with him that the primary pain he is having is likely secondary to diabetic peripheral neuropathy.  He currently takes gabapentin and he should discuss dosages increase or regimen changes with his primary care doctor who is prescribing this for him.  He does have dystrophic nails status post previous nail avulsions.  Debrided the nails today in length and thickness with a sharp nail nipper to comfort.  Discussed with him that if this continues to be a problem we could consider permanent nail removal with matricectomy  Return in about 1 month (around 04/06/2020).

## 2020-03-06 NOTE — Patient Instructions (Signed)
Ask your primary care doctor if they have checked your Hemoglobin A1c and if your gabapentin dosage has been increased

## 2020-03-13 ENCOUNTER — Ambulatory Visit (INDEPENDENT_AMBULATORY_CARE_PROVIDER_SITE_OTHER): Payer: Medicare Other | Admitting: Nurse Practitioner

## 2020-03-13 ENCOUNTER — Other Ambulatory Visit: Payer: Self-pay

## 2020-03-13 ENCOUNTER — Encounter: Payer: Self-pay | Admitting: Nurse Practitioner

## 2020-03-13 VITALS — BP 116/74 | HR 73 | Temp 97.2°F | Ht 70.0 in | Wt 275.8 lb

## 2020-03-13 DIAGNOSIS — R197 Diarrhea, unspecified: Secondary | ICD-10-CM | POA: Diagnosis not present

## 2020-03-13 DIAGNOSIS — R1030 Lower abdominal pain, unspecified: Secondary | ICD-10-CM

## 2020-03-13 DIAGNOSIS — K5900 Constipation, unspecified: Secondary | ICD-10-CM | POA: Diagnosis not present

## 2020-03-13 NOTE — Progress Notes (Signed)
Referring Provider: Nolene Ebbs, MD Primary Care Physician:  Nolene Ebbs, MD Primary GI:  Dr. Abbey Chatters  Chief Complaint  Patient presents with  . Abdominal Pain    Lower abd, comes/goes  . Diarrhea    3 times per week    HPI:   Dalton [name] is a 60 y.o. male who presents for follow-up on abdominal pain and constipation.  The patient was last seen in our office 09/18/2019 for GERD and dysphagia.  Previously seen for lower abdominal pain and constipation with history of OIC.  Colonoscopy next due in 2022.  Previous trial and failure of Amitiza, magnesium citrate, Linzess.  Insurance required Relistor trial prior to approving Movantik but they would not pay for Relistor either.  Movantik was eventually covered and worked well initially but needed Linzess addition and mag citrate occasionally.  He tends to have more GERD symptoms when he is constipated.  At his last visit noted dysphagia for months, although not mentioned at previous office visit.  Is with solid foods, generally every meal.  GERD generally well controlled on PPI.  No other overt GI complaints.  Is trying to eat softer foods and liquid based foods with thorough chewing.  Recommended continue Movantik, continue other medications, ER precautions given for dysphagia, recommended EGD, follow-up in 6 months.  EGD initially scheduled for 10/16/2019.  However, the patient rescheduled to 12/04/2019.  EGD found benign-appearing esophageal stenosis status post dilation, gastritis status post biopsy, normal duodenum.  Surgical pathology found the gastric biopsies to be gastric antral mucosa with mild reactive gastropathy and mild chronic gastritis, negative for H. pylori.  Recommended repeat EGD with dilation as needed, start Dexilant 60 mg daily.  She called our office 01/08/2020 indicating persistent coughing and dysphagia type symptoms.  Recommended barium pill esophagram, may eventually need speech pathology referral.  Reiterated  the ER instructions.  BPE was completed 01/14/2020 which found normal exam.  Further discussion of his symptoms include "when he lays down or walks a short distance he starts to cough up phlegm".  The patient has transportation difficulties.  I called the patient and discussed his symptoms with him with essentially normal EGD and BPE.  Recommended follow-up with ENT.  Today states he is doing okay overall.  He has been helping an elderly neighbor because her daughter was found on the floor dead of an MI. The neighbor is 73 years old. Recently he will have abdominal pain and diarrhea when he has milk; unfortunately he likes cereal. Still taking Movantik daily. When not having milk-associated symptoms his stools are doing ok. Will also have symptoms if he eats something he knows will cause an issue. When he takes his Movantik he will have regular bowel movements. Not needing Mag Citrate recently. His pain is typically left sided and will pass with time and rest. Denies N/V, hematochezia, melena, fever, chills, unintentional weight loss. Denies URI or flu-like symptoms. Denies loss of sense of taste or smell. The patient has received COVID-19 vaccination(s). Denies chest pain, dyspnea, dizziness, lightheadedness, syncope, near syncope. Denies any other upper or lower GI symptoms.  At the end of his visit he notes urgency, diarrhea may be more than just with daily products. GERD symptoms currently doing well on current medications without significant breakthrough.  Past Medical History:  Diagnosis Date  . Anemia   . Anxiety   . Asthma   . Bipolar disorder ([place])   . CHF (congestive heart failure) ([place])   . Chronic back  pain    [facility] in [place]  . Chronic bronchitis   . Chronic lower back pain   . Congestive heart failure (CHF) ([place])   . COPD (chronic obstructive pulmonary disease) ([place])    per patient 03/05/19  . Coronary artery disease   . Coughing   . Depression   . DVT (deep venous  thrombosis) ([place]) ~ 2005   LLE  . Frequency of urination   . GERD (gastroesophageal reflux disease)   . 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 WUJWJXBJ(478.2)    "a few times/week" (12/24/2015)  . High cholesterol   . HNP (herniated nucleus pulposus), cervical   . Hypertension   . Laceration of right hand 11/27/2010  . Laceration of wrist 2007 BIL FOREARMS  . MI (myocardial infarction) ([place])    7, last one was in 2011 (12/24/2015)  . Neuromuscular disorder ([facility])    legs/feet  . Neuropathy    legs/feet  . NSAID-induced gastric ulcer    "Ibuprofen"  . PUD (peptic ulcer disease)    in 1990s, secondary to medication  . Rheumatoid arthritis ([place])    "all over" (12/24/2015)  . Shortness of breath    with exertion  . Tonsillitis, chronic    Dr. Vicki Mallet in [place]  . Type II diabetes mellitus ([place])   . Wears glasses     Past Surgical History:  Procedure Laterality Date  . ANTERIOR CERVICAL DECOMP/DISCECTOMY FUSION N/A 11/05/2016   Procedure: Anterior Cervical Discectomy and Fusion - Cervical three-Cervical four;  Surgeon: Earnie Larsson, MD;  Location: [facility];  Service: Neurosurgery;  Laterality: N/A;  . BACK SURGERY     x3  . BALLOON DILATION N/A 12/04/2019   Procedure: BALLOON DILATION;  Surgeon: Eloise Harman, DO;  [facility];  Service: Endoscopy;  Laterality: N/A;  . BIOPSY N/A 05/30/2012   Procedure: BIOPSY;  Surgeon: Danie Binder, MD;  [facility];  Service: Endoscopy;  Laterality: N/A;  . BIOPSY  03/02/2016   Procedure: BIOPSY;  Surgeon: Danie Binder, MD;  [facility];  Service: Endoscopy;;  gastric  . BIOPSY  12/04/2019   Procedure: BIOPSY;  Surgeon: Eloise Harman, DO;  [facility];  Service: Endoscopy;;  . CARDIAC CATHETERIZATION  "several"  . CARPAL TUNNEL RELEASE Bilateral   . CATARACT EXTRACTION W/PHACO Right 06/15/2016   Procedure: CATARACT EXTRACTION PHACO  AND INTRAOCULAR LENS PLACEMENT (IOC);  Surgeon: Rutherford Guys, MD;  [facility];  Service: Ophthalmology;  Laterality: Right;  CDE: 4.64  . CATARACT EXTRACTION W/PHACO Left 07/13/2016   Procedure: CATARACT EXTRACTION PHACO AND INTRAOCULAR LENS PLACEMENT (IOC);  Surgeon: Rutherford Guys, MD;  [facility];  Service: Ophthalmology;  Laterality: Left;  CDE: 3.15  . COLONOSCOPY  12/28/2010   SLF: (MAC)Internal hemorrhoids/four small colon polyps tubular adenomas. per SLF: colonoscopy 2022  . COLONOSCOPY WITH PROPOFOL N/A 07/05/2017   Procedure: COLONOSCOPY WITH PROPOFOL;  Surgeon: Danie Binder, MD;  [facility];  Service: Endoscopy;  Laterality: N/A;  7:30am  . CORONARY ARTERY BYPASS GRAFT  2002   3 vessels  . ESOPHAGOGASTRODUODENOSCOPY N/A 05/30/2012   SLF: UNCONTROLLED GERD DUE TO LIFESTYLE CHOICE/WEIGHT GAIN/MILD Non-erosive gastritis  . ESOPHAGOGASTRODUODENOSCOPY (EGD) WITH PROPOFOL N/A 03/02/2016   Dr. Oneida Alar: Esophagus appeared normal, impaired dilation performed, patchy inflammation with edema and erythema of the entire stomach., Biopsy with H pylori, patient completed Pylera.   [name] Kitchen  ESOPHAGOGASTRODUODENOSCOPY (EGD) WITH PROPOFOL N/A 12/04/2019   Procedure: ESOPHAGOGASTRODUODENOSCOPY (EGD) WITH PROPOFOL;  Surgeon: Eloise Harman, DO;  [facility];  Service: Endoscopy;  Laterality: N/A;  2:45pm  . FRACTURE SURGERY    . LEFT HEART CATHETERIZATION WITH CORONARY ANGIOGRAM N/A 08/31/2011   Procedure: LEFT HEART CATHETERIZATION WITH CORONARY ANGIOGRAM;  Surgeon: Laverda Page, MD;  Location: [facility] CATH [facility];  Service: Cardiovascular;  Laterality: N/A;  . LUMBAR DISC SURGERY     "L4-5; Dr. Trenton Gammon"  . LUMBAR LAMINECTOMY/DECOMPRESSION MICRODISCECTOMY Right 03/06/2019   Procedure: MICRODISCECTOMY EXTRAFORAMINAL LUMBAR THREE - LUMBAR FOUR RIGHT;  Surgeon: Earnie Larsson, MD;  Location: [facility];  Service: Neurosurgery;  Laterality: Right;  MICRODISCECTOMY EXTRAFORAMINAL LUMBAR THREE -  LUMBAR FOUR RIGHT  . MULTIPLE TOOTH EXTRACTIONS    . ORIF MANDIBULAR FRACTURE N/A 12/19/2015   Procedure: OPEN REDUCTION INTERNAL FIXATION (ORIF) MANDIBULAR FRACTURE;  Surgeon: Izora Gala, MD;  Location: [facility];  Service: ENT;  Laterality: N/A;  . PATELLA FRACTURE SURGERY Left 1976   plate to knee cap from accident  . POLYPECTOMY  07/05/2017   Procedure: POLYPECTOMY;  Surgeon: Danie Binder, MD;  [facility];  Service: Endoscopy;;  colon  . SAVORY DILATION  12/28/2010   SLF:(MAC)J-shaped stomach/nodular mocosa in the distal esophagus/empiric dilation 68m  . SAVORY DILATION N/A 03/02/2016   Procedure: SAVORY DILATION;  Surgeon: SDanie Binder MD;  [facility];  Service: Endoscopy;  Laterality: N/A;    Current Outpatient Medications  Medication Sig Dispense Refill  . albuterol (PROVENTIL HFA;VENTOLIN HFA) 108 (90 Base) MCG/ACT inhaler Inhale 2 puffs into the lungs every 6 (six) hours as needed for wheezing or shortness of breath.     .[name] Kitchenalbuterol (PROVENTIL) (2.5 MG/3ML) 0.083% nebulizer solution Take 2.5 mg by nebulization every 6 (six) hours as needed for wheezing or shortness of breath.     . ALPRAZolam (XANAX) 1 MG tablet Take 1 mg by mouth 3 (three) times daily.     .[name] KitchenamLODipine (NORVASC) 5 MG tablet Take 1 tablet (5 mg total) by mouth daily. 90 tablet 1  . atorvastatin (LIPITOR) 40 MG tablet Take 40 mg by mouth at bedtime.    . beclomethasone (QVAR) 80 MCG/ACT inhaler Inhale 2 puffs into the lungs 2 (two) times daily as needed (respiratory issues.).     .[name] Kitchencetirizine (ZYRTEC) 10 MG tablet Take 10 mg by mouth daily as needed for allergies.   0  . clopidogrel (PLAVIX) 75 MG tablet Take 75 mg by mouth daily.    .[name] Kitchendexlansoprazole (DEXILANT) 60 MG capsule Take 1 capsule (60 mg total) by mouth daily. 30 capsule 5  . Evolocumab (REPATHA SURECLICK) 1237MG/ML SOAJ Inject 140 mg into the skin every 14 (fourteen) days. 2 pen 11  . ezetimibe (ZETIA) 10 MG tablet Take 10 mg by  mouth daily.    . fluticasone (FLONASE) 50 MCG/ACT nasal spray Place 2 sprays into both nostrils daily.    . Fluticasone Furoate (ARNUITY ELLIPTA) 200 MCG/ACT AEPB Inhale 1 puff into the lungs daily as needed (shortness of breath).     . furosemide (LASIX) 40 MG tablet TAKE 1 TABLET(40 MG) BY MOUTH DAILY (Patient taking differently: Take 40 mg by mouth daily.) 90 tablet 3  . glipiZIDE (GLUCOTROL) 10 MG tablet Take 10 mg by mouth daily before breakfast.    . hydrochlorothiazide (HYDRODIURIL) 25 MG tablet Take 25 mg by mouth daily.    .[name] Kitchenipratropium (ATROVENT) 0.02 % nebulizer  solution Take 0.5 mg by nebulization 2 (two) times daily as needed for wheezing or shortness of breath (for congestion).     . isosorbide mononitrate (IMDUR) 30 MG 24 hr tablet Take 0.5 tablets (15 mg total) by mouth at bedtime. 45 tablet 3  . ketoconazole (NIZORAL) 2 % shampoo Apply 1 application topically daily as needed for irritation.     [name] Kitchen lisinopril (PRINIVIL,ZESTRIL) 10 MG tablet Take 10 mg by mouth daily.    . Liver Extract (LIVER PO) Take 1 tablet by mouth daily. "Liver Aid"    . magnesium citrate SOLN Take 1 Bottle by mouth as needed for moderate constipation.     . metFORMIN (GLUCOPHAGE-XR) 500 MG 24 hr tablet Take 500 mg by mouth daily with breakfast.     . metoprolol tartrate (LOPRESSOR) 25 MG tablet Take 0.5 tablets (12.5 mg total) by mouth 2 (two) times daily. (Patient taking differently: Take 12.5 mg by mouth 2 (two) times daily.) 30 tablet 0  . Multiple Vitamin (MULTIVITAMIN WITH MINERALS) TABS tablet Take 1 tablet by mouth daily.    . Multiple Vitamins-Minerals (EMERGEN-C IMMUNE) PACK Take 1 packet by mouth daily.    . naloxegol oxalate (MOVANTIK) 25 MG TABS tablet Take 1 tablet (25 mg total) by mouth daily. 30 tablet 5  . NARCAN 4 MG/0.1ML LIQD nasal spray kit Place 1 spray into the nose as needed (opioid overdose).   0  . nitroGLYCERIN (NITROSTAT) 0.4 MG SL tablet SEE NOTES (Patient taking differently: Place  0.4 mg under the tongue every 5 (five) minutes as needed for chest pain.) 100 tablet 3  . oxyCODONE-acetaminophen (PERCOCET) 10-325 MG tablet Take 1 tablet by mouth every 4 (four) hours as needed for pain.    . potassium chloride SA (KLOR-CON) 20 MEQ tablet Take 20 mEq by mouth 2 (two) times daily.    . sertraline (ZOLOFT) 100 MG tablet Take 100 mg by mouth daily.    . sitaGLIPtin (JANUVIA) 100 MG tablet Take 100 mg by mouth daily.    . tamsulosin (FLOMAX) 0.4 MG CAPS capsule Take 1 capsule (0.4 mg total) by mouth daily after breakfast. 30 capsule 0  . Vitamin D, Ergocalciferol, (DRISDOL) 1.25 MG (50000 UT) CAPS capsule Take 50,000 Units by mouth every 7 (seven) days. Monday     No current facility-administered medications for this visit.    Allergies as of 03/13/2020 - Review Complete 03/13/2020  Allergen Reaction Noted  . Gabapentin Hives   . Ibuprofen Other (See Comments)   . Zolpidem tartrate Other (See Comments)   . Naproxen Other (See Comments)     Family History  Problem Relation Age of Onset  . Diabetes Mother   . Hypertension Mother   . Heart attack Mother 97  . Hypertension Father   . Diabetes Father   . Heart attack Father 16  . Heart attack Other        mother, father, brother, sister all deceased due to MI  . Heart attack Sister   . Heart attack Brother   . Seizures Brother   . Heart failure Other   . Colon cancer Neg Hx   . Liver disease Neg Hx   . Anesthesia problems Neg Hx   . Hypotension Neg Hx   . Malignant hyperthermia Neg Hx   . Pseudochol deficiency Neg Hx   . Colon polyps Neg Hx     Social History   Socioeconomic History  . Marital status: Single    Spouse name: Not  on file  . Number of children: 2  . Years of education: Not on file  . Highest education level: Not on file  Occupational History  . Occupation: disabled    Fish farm manager: NOT EMPLOYED  Tobacco Use  . Smoking status: Never Smoker  . Smokeless tobacco: Never Used  Vaping Use  .  Vaping Use: Never used  Substance and Sexual Activity  . Alcohol use: No    Alcohol/week: 0.0 standard drinks  . 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 Sexual activity: Not Currently  Other Topics Concern  . Not on file  Social History Narrative   Lives w/ son-23/22   Social Determinants of Health   Financial Resource Strain: Not on file  Food Insecurity: Not on file  Transportation Needs: Not on file  Physical Activity: Not on file  Stress: Not on file  Social Connections: Not on file    Subjective: Review of Systems  Constitutional: Negative for chills, fever, malaise/fatigue and weight loss.  HENT: Negative for congestion and sore throat.   Respiratory: Negative for cough and shortness of breath.   Cardiovascular: Negative for chest pain and palpitations.  Gastrointestinal: Positive for abdominal pain and diarrhea. Negative for blood in stool, heartburn, melena, nausea and vomiting.  Musculoskeletal: Negative for joint pain and myalgias.  Skin: Negative for rash.  Neurological: Negative for dizziness and weakness.  Endo/Heme/Allergies: Does not bruise/bleed easily.  Psychiatric/Behavioral: Negative for depression. The patient is not nervous/anxious.   All other systems reviewed and are negative.    Objective: BP 116/74   Pulse 73   Temp (!) 97.2 F (36.2 C)   Ht _0  (1.778 m)   Wt 275 lb 12.8 oz (125.1 kg)   BMI 39.57 kg/m  Physical Exam Vitals and nursing note reviewed.  Constitutional:      General: He is not in acute distress.    Appearance: Normal appearance. He is well-developed. He is obese. He is not ill-appearing, toxic-appearing or diaphoretic.  HENT:     Head: Normocephalic and atraumatic.     Nose: No congestion or rhinorrhea.  Eyes:     General: No scleral icterus. Cardiovascular:     Rate and Rhythm: Normal rate and regular rhythm.     Heart sounds: Normal  heart sounds.  Pulmonary:     Effort: Pulmonary effort is normal.     Breath sounds: Normal breath sounds.  Abdominal:     General: Bowel sounds are normal. There is no distension.     Palpations: Abdomen is soft. There is no hepatomegaly, splenomegaly or mass.     Tenderness: There is no abdominal tenderness. There is no guarding or rebound.     Hernia: No hernia is present.  Musculoskeletal:     Cervical back: Neck supple.  Skin:    General: Skin is warm and dry.     Coloration: Skin is not jaundiced.     Findings: No bruising or rash.  Neurological:     General: No focal deficit present.     Mental Status: He is alert and oriented to person, place, and time. Mental status is at baseline.  Psychiatric:        Mood and Affect: Mood normal.        Behavior: Behavior normal.        Thought Content: Thought content normal.      Assessment:  Very pleasant  60 year old male presents for follow-up on constipation and abdominal pain.  No red flag/warning signs or symptoms.  GERD: Doing well on current medication regimen.  No breakthrough.  Makes effort to avoid trigger foods  Constipation: Chronic history of constipation previously tried and failed multiple medications as per HPI.  Currently on Movantik which seems to be working well.  However, due to his occasional diarrhea symptoms (as per below) I recommended he cut Movantik in half for an effective dose of 12.5 mg a day to see if this continues to prevent constipation but helps with some of his diarrhea.  Abdominal discomfort and diarrhea: This is somewhat new for him, he notes that he does have GI distress recently when drinking milk or eating cereal with milk.  Possible development of lactose intolerance.  We discussed possibly abstaining from all dairy products for 2 weeks versus trial of Lactaid or the like with dairy products to see if this helps with his symptoms.  Also we will reduce his dose of Movantik as per above to see if  this helps as well.  I recommended that he call us for any worsening symptoms.   Plan: 1. Trial of Lactaid or dairy abstinence for 2 weeks 2. Movantik and half (equivalent to 12.5 mg daily) to see if this helps control constipation and prevent diarrhea 3. Continue other medications 4. Call for any worsening symptoms 5. Follow-up in 3 months    Thank you for allowing [place] to participate in the care of Lauree Chandler, DNP, AGNP-C Adult & Gerontological Nurse Practitioner [facility]   03/13/2020 10:31 AM   Disclaimer: This note was dictated with voice recognition software. Similar sounding words can inadvertently be transcribed and may not be corrected upon review.

## 2020-03-13 NOTE — Patient Instructions (Signed)
Your health issues we discussed today were:   Abdominal discomfort with constipation and now diarrhea: 1. As we discussed, you may have developed lactose intolerance.  You can try taking Lactaid or store brand "lactase" with any dairy products to see if this helps prevent symptoms 2. I would also like you to try cutting your Movantik in half and taking 1/2 tablet once a day to see if this can control your constipation but prevent diarrhea 3. Continue your other current medications 4. Call us for any worsening symptoms  GERD (reflux/heartburn): 1. Glad you are doing well estimation point 2. Continue your current acid blocker medications 3. Call us for any worsening symptoms 4. Continue to avoid food and other things that cause for symptoms  Overall I recommend:  1. Continue other current medications 2. Return for follow-up in 3 months 3. Call us if you have any questions or concerns   ---------------------------------------------------------------  I am glad you have gotten your COVID-19 vaccination!  Even though you are fully vaccinated you should continue to follow CDC and state/local guidelines.  ---------------------------------------------------------------   At [facility] we value your feedback. You may receive a survey about your visit today. Please share your experience as we strive to create trusting relationships with our patients to provide genuine, compassionate, quality care.  We appreciate your understanding and patience as we review any laboratory studies, imaging, and other diagnostic tests that are ordered as we care for you. Our office policy is 5 business days for review of these results, and any emergent or urgent results are addressed in a timely manner for your best interest. If you do not hear from our office in 1 week, please contact us.   We also encourage the use of MyChart, which contains your medical information for your review as well. If  you are not enrolled in this feature, an access code is on this after visit summary for your convenience. Thank you for allowing [place] to be involved in your care.  It was great to see you today!  I hope you have a safe and warm winter!!

## 2020-04-03 ENCOUNTER — Ambulatory Visit: Payer: Medicare Other | Admitting: Podiatry

## 2020-04-06 ENCOUNTER — Other Ambulatory Visit: Payer: Self-pay | Admitting: Cardiology

## 2020-04-06 DIAGNOSIS — I10 Essential (primary) hypertension: Secondary | ICD-10-CM

## 2020-04-07 NOTE — Telephone Encounter (Signed)
This is a Nurse, mental health pt, Dr. Harl Bowie

## 2020-04-11 ENCOUNTER — Other Ambulatory Visit: Payer: Self-pay | Admitting: Cardiology

## 2020-06-11 ENCOUNTER — Ambulatory Visit: Payer: Medicare Other | Admitting: Nurse Practitioner

## 2020-06-30 ENCOUNTER — Ambulatory Visit: Payer: Medicare Other | Admitting: Podiatry

## 2020-07-28 ENCOUNTER — Other Ambulatory Visit: Payer: Self-pay

## 2020-07-28 ENCOUNTER — Ambulatory Visit (INDEPENDENT_AMBULATORY_CARE_PROVIDER_SITE_OTHER): Payer: Medicare Other | Admitting: Podiatry

## 2020-07-28 ENCOUNTER — Encounter: Payer: Self-pay | Admitting: Podiatry

## 2020-07-28 DIAGNOSIS — E1142 Type 2 diabetes mellitus with diabetic polyneuropathy: Secondary | ICD-10-CM | POA: Diagnosis not present

## 2020-07-28 DIAGNOSIS — E1159 Type 2 diabetes mellitus with other circulatory complications: Secondary | ICD-10-CM | POA: Diagnosis not present

## 2020-07-28 DIAGNOSIS — L603 Nail dystrophy: Secondary | ICD-10-CM | POA: Diagnosis not present

## 2020-07-28 NOTE — Progress Notes (Signed)
  Subjective:  Patient ID: Dalton [name], male    DOB: March 09, 1960,  MRN: [id number]  Chief Complaint  Patient presents with  . Diabetes     bilateral foot pain with swelling follow up, surgery consult  . PVD    60 y.o. male returns for follow-up with the above complaint. History confirmed with patient.  Still having discomfort with the nails.  He complains of burning aching pain in both feet that is intermittent  Objective:  Physical Exam: +2 pedal pulses, foot is warm and well-perfused.  He has abnormal sensory exam with loss of protective sensation with a monofilament, subjective paresthesias.  Dystrophic bilateral hallux and left second toenail Assessment:   1. Diabetic peripheral neuropathy ([place])   2. Type 2 diabetes mellitus with vascular disease ([place])   3. Nail dystrophy      Plan:  Patient was evaluated and treated and all questions answered.  Discussed with him that the primary pain he is having is likely secondary to diabetic peripheral neuropathy.  He currently takes gabapentin and he should discuss dosages increase or regimen changes with his primary care doctor who is prescribing this for him.  He does have dystrophic nails status post previous nail avulsions.  Debrided the nails today in length and thickness with a sharp nail nipper to comfort.  Discussed with him that if this continues to be a problem we could consider permanent nail removal with matricectomy  Return if symptoms worsen or fail to improve.

## 2020-07-28 NOTE — Patient Instructions (Signed)
Talk to your doctor about gabapentin dosing

## 2020-07-30 ENCOUNTER — Emergency Department (HOSPITAL_COMMUNITY)
Admission: EM | Admit: 2020-07-30 | Discharge: 2020-07-30 | Disposition: A | Discharge status: Home / Self Care | Payer: Medicare Other | Attending: Emergency Medicine | Admitting: Emergency Medicine

## 2020-07-30 ENCOUNTER — Encounter (HOSPITAL_COMMUNITY): Payer: Self-pay

## 2020-07-30 ENCOUNTER — Other Ambulatory Visit: Payer: Self-pay

## 2020-07-30 DIAGNOSIS — R059 Cough, unspecified: Secondary | ICD-10-CM | POA: Insufficient documentation

## 2020-07-30 DIAGNOSIS — R0602 Shortness of breath: Secondary | ICD-10-CM | POA: Diagnosis not present

## 2020-07-30 DIAGNOSIS — Z5321 Procedure and treatment not carried out due to patient leaving prior to being seen by health care provider: Secondary | ICD-10-CM | POA: Diagnosis not present

## 2020-07-30 NOTE — ED Triage Notes (Signed)
Pt to er, pt states that he is here because he thinks that he caught a little cough from his grandson, states that he has a little cough, and sob.  Pt states that he has been feeling poorly for the past couple of days.

## 2020-07-30 NOTE — ED Notes (Signed)
Pt. Notified this nurse that " I'm feeling much better. I think I am going to go ahead and go home." This nurse educated pt. That if the left they would be leaving AMA. Pt. Insisted that they wanted to leave. Pt. ambulated with a steady gait.

## 2020-08-04 ENCOUNTER — Other Ambulatory Visit: Payer: Self-pay

## 2020-08-04 MED ORDER — ISOSORBIDE MONONITRATE ER 30 MG PO TB24
15.0000 mg | ORAL_TABLET | Freq: Every day | ORAL | 0 refills | Status: DC
Start: 2020-08-04 — End: 2020-10-31

## 2020-09-02 NOTE — Progress Notes (Signed)
Referring Provider: Nolene Ebbs, MD Primary Care Physician:  Nolene Ebbs, MD Primary GI Physician: Dr. Abbey Chatters  Chief Complaint  Patient presents with   Constipation   Abdominal Cramping     HPI:   Dalton [name] is a 60 y.o. male presenting today with a history of GERD, dysphagia with esophageal stenosis s/p recent dilation in October 2021, H. pylori s/p treatment with Pylera in 2018 and follow-up endoscopic biopsies negative, opioid-induced constipation, and lower abdominal pain.  Last colonoscopy May 2019, was due for surveillance colonoscopy May 2022.  He is presenting today for follow-up of lower abdominal pain, constipation, and diarrhea.  Previous trial and failure of Amitiza, magnesium citrate, Linzess.  Insurance required Relistor trial prior to approving Movantik but they would not pay for Relistor either.  Movantik was eventually covered and worked well initially but has needed Linzess addition and mag citrate occasionally.  At his last office visit in January 2022, he reported recent development of left-sided abdominal pain and diarrhea when having milk.  Unfortunately, he likes cereal.  Without milk, stools are okay.  Still taking Movantik daily.  Not needing mag citrate.  GERD doing well on Dexilant daily.  No other significant symptoms.  Suspected possible lactose intolerance.  Recommended decreasing Movantik to 12.5 mg daily and abstaining from all dairy products for 2 weeks versus trial of Lactaid with dairy to see if this helps.  Follow-up in 3 months.  Today:  Constipation: Taking Movantik 1/2 tablet. Bms 2 to 3 days.  Has to take Linzess to make his bowels move.  He is taking 2-3 Linzess at a time which results in a 2-3 watery stools that day.  Between bowel movements, he develops lower abdominal discomfort and abdominal bloating that improves after bowel movements.  Also taking mag citrate every now and then, but not very frequently.  Denies BRBPR or melena.   Admits to weight loss due to dietary changes and efforts to control his diabetes.   Would like to get his colonoscopy scheduled as soon as possible.  GERD: Fairly well controlled on Dexilant daily. Breakthrough 1-2 times a week. Watching his diet due to diabetes. No dysphagia.   Right lower back and hip pain lately though he has chronic back pain for which he takes Percocet 4 times a day.  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;unknown etiology-pt sts heriditary (12/24/2015)   KVQQVZDG(387.5)    "a few times/week" (12/24/2015)   High cholesterol    HNP (herniated nucleus pulposus), cervical    Hypertension    Laceration of right hand 11/27/2010   Laceration of wrist 2007 BIL FOREARMS   MI (myocardial infarction) ([place])    7, last one was in 2011 (12/24/2015)   Neuromuscular disorder ([place])  legs/feet   Neuropathy    legs/feet   NSAID-induced gastric ulcer    "Ibuprofen"   PUD (peptic ulcer disease)    in 1990s, secondary to medication   Rheumatoid arthritis ([place])    "all over" (12/24/2015)   Shortness of breath    with exertion   Tonsillitis, chronic    Dr. Vicki Mallet in [place]   Type II diabetes mellitus [facility])    Wears glasses     Past Surgical History:  Procedure Laterality Date   ANTERIOR CERVICAL DECOMP/DISCECTOMY FUSION N/A 11/05/2016   Procedure: Anterior Cervical Discectomy and Fusion - Cervical three-Cervical four;  Surgeon:  Earnie Larsson, MD;  Location: [place];  Service: Neurosurgery;  Laterality: N/A;   BACK SURGERY     x3   BALLOON DILATION N/A 12/04/2019   Procedure: BALLOON DILATION;  Surgeon: Eloise Harman, DO;  [facility];  Service: Endoscopy;  Laterality: N/A;   BIOPSY N/A 05/30/2012   Procedure: BIOPSY;  Surgeon: Danie Binder, MD;  [facility];  Service: Endoscopy;  Laterality: N/A;   BIOPSY  03/02/2016   Procedure: BIOPSY;  Surgeon: Danie Binder, MD;  [facility];  Service: Endoscopy;;  gastric   BIOPSY  12/04/2019   Procedure: BIOPSY;  Surgeon: Eloise Harman, DO;  [facility];  Service: Endoscopy;;   CARDIAC CATHETERIZATION  "several"   CARPAL TUNNEL RELEASE Bilateral    CATARACT EXTRACTION W/PHACO Right 06/15/2016   Procedure: CATARACT EXTRACTION PHACO AND INTRAOCULAR LENS PLACEMENT ([place]);  Surgeon: Rutherford Guys, MD;  [facility];  Service: Ophthalmology;  Laterality: Right;  CDE: 4.64   CATARACT EXTRACTION W/PHACO Left 07/13/2016   Procedure: CATARACT EXTRACTION PHACO AND INTRAOCULAR LENS PLACEMENT ([facility]);  Surgeon: Rutherford Guys, MD;  [facility];  Service: Ophthalmology;  Laterality: Left;  CDE: 3.15   COLONOSCOPY  12/28/2010   SLF: (MAC)Internal hemorrhoids/four small colon polyps tubular adenomas. per SLF: colonoscopy 2022   COLONOSCOPY WITH PROPOFOL N/A 07/05/2017   Surgeon: Danie Binder, MD; tubular adenoma, hyperplastic polyp, submucosal nodule at hepatic flexure consistent with lipoma on biopsy, diverticulosis and rectosigmoid and sigmoid colon, rectal bleeding due to large internal hemorrhoids.  Recommended repeat in 3 years due to history of polyps and oily film on scope.   CORONARY ARTERY BYPASS GRAFT  2002   3 vessels   ESOPHAGOGASTRODUODENOSCOPY N/A 05/30/2012   SLF: UNCONTROLLED GERD DUE TO LIFESTYLE CHOICE/WEIGHT GAIN/MILD Non-erosive gastritis   ESOPHAGOGASTRODUODENOSCOPY (EGD) WITH PROPOFOL N/A 03/02/2016   Dr. Oneida Alar:  Esophagus appeared normal, impaired dilation performed, patchy inflammation with edema and erythema of the entire stomach., Biopsy with H pylori, patient completed Pylera.    ESOPHAGOGASTRODUODENOSCOPY (EGD) WITH PROPOFOL N/A 12/04/2019   Surgeon: Eloise Harman, DO; benign appearing esophageal stenosis dilated, gastritis biopsied (mild reactive gastropathy, gastritis, negative H. pylori), normal duodenum.   FRACTURE SURGERY     LEFT HEART CATHETERIZATION WITH CORONARY ANGIOGRAM N/A 08/31/2011   Procedure: LEFT HEART CATHETERIZATION WITH CORONARY ANGIOGRAM;  Surgeon: Laverda Page, MD;  Location: [facility] CATH LAB;  Service: Cardiovascular;  Laterality: N/A;   LUMBAR DISC SURGERY     "L4-5; Dr. Trenton Gammon"   LUMBAR LAMINECTOMY/DECOMPRESSION MICRODISCECTOMY Right 03/06/2019   Procedure: MICRODISCECTOMY EXTRAFORAMINAL LUMBAR THREE - LUMBAR FOUR RIGHT;  Surgeon: Earnie Larsson, MD;  Location: [place];  Service: Neurosurgery;  Laterality: Right;  MICRODISCECTOMY EXTRAFORAMINAL LUMBAR THREE - LUMBAR FOUR RIGHT   MULTIPLE TOOTH EXTRACTIONS     ORIF MANDIBULAR FRACTURE N/A  12/19/2015   Procedure: OPEN REDUCTION INTERNAL FIXATION (ORIF) MANDIBULAR FRACTURE;  Surgeon: Izora Gala, MD;  Location: [place];  Service: ENT;  Laterality: N/A;   PATELLA FRACTURE SURGERY Left 1976   plate to knee cap from accident   POLYPECTOMY  07/05/2017   Procedure: POLYPECTOMY;  Surgeon: Danie Binder, MD;  [facility];  Service: Endoscopy;;  colon   SAVORY DILATION  12/28/2010   SLF:(MAC)J-shaped stomach/nodular mocosa in the distal esophagus/empiric dilation 35m   SAVORY DILATION N/A 03/02/2016   Procedure: SAVORY DILATION;  Surgeon: SDanie Binder MD;  [facility];  Service: Endoscopy;  Laterality: N/A;    Current Outpatient Medications  Medication Sig Dispense Refill   albuterol (PROVENTIL HFA;VENTOLIN HFA) 108 (90 Base) MCG/ACT inhaler Inhale 2 puffs into the lungs every 6 (six) hours as needed for  wheezing or shortness of breath.      albuterol (PROVENTIL) (2.5 MG/3ML) 0.083% nebulizer solution Take 2.5 mg by nebulization every 6 (six) hours as needed for wheezing or shortness of breath.      ALPRAZolam (XANAX) 1 MG tablet Take 1 mg by mouth 3 (three) times daily.      amLODipine (NORVASC) 5 MG tablet TAKE 1 TABLET(5 MG) BY MOUTH DAILY 7 tablet 0   atorvastatin (LIPITOR) 40 MG tablet Take 40 mg by mouth at bedtime.     beclomethasone (QVAR) 80 MCG/ACT inhaler Inhale 2 puffs into the lungs 2 (two) times daily as needed (respiratory issues.).      cetirizine (ZYRTEC) 10 MG tablet Take 10 mg by mouth daily as needed for allergies.   0   clopidogrel (PLAVIX) 75 MG tablet Take 75 mg by mouth daily.     dexlansoprazole (DEXILANT) 60 MG capsule Take 1 capsule (60 mg total) by mouth daily. 30 capsule 5   ezetimibe (ZETIA) 10 MG tablet Take 10 mg by mouth daily.     fluticasone (FLONASE) 50 MCG/ACT nasal spray Place 2 sprays into both nostrils daily.     Fluticasone Furoate (ARNUITY ELLIPTA) 200 MCG/ACT AEPB Inhale 1 puff into the lungs daily as needed (shortness of breath).      furosemide (LASIX) 40 MG tablet TAKE 1 TABLET(40 MG) BY MOUTH DAILY (Patient taking differently: Take 40 mg by mouth daily.) 90 tablet 3   glipiZIDE (GLUCOTROL) 10 MG tablet Take 10 mg by mouth daily before breakfast.     hydrochlorothiazide (HYDRODIURIL) 25 MG tablet Take 25 mg by mouth daily.     ipratropium (ATROVENT) 0.02 % nebulizer solution Take 0.5 mg by nebulization 2 (two) times daily as needed for wheezing or shortness of breath (for congestion).      isosorbide mononitrate (IMDUR) 30 MG 24 hr tablet Take 0.5 tablets (15 mg total) by mouth at bedtime. Please call our office and schedule yearly appointments for further refills. 1st attempt [phone] 45 tablet 0   ketoconazole (NIZORAL) 2 % shampoo Apply 1 application topically daily as needed for irritation.      linaclotide (LINZESS) 145 MCG CAPS capsule Take  145 mcg by mouth daily as needed. 3-4 times a week.     lisinopril (PRINIVIL,ZESTRIL) 10 MG tablet Take 10 mg by mouth daily.     Liver Extract (LIVER PO) Take 1 tablet by mouth daily. "Liver Aid"     magnesium citrate SOLN Take 1 Bottle by mouth as needed for moderate constipation.      metFORMIN (GLUCOPHAGE-XR) 500 MG 24 hr tablet Take 500 mg by mouth  daily with breakfast.      metoprolol tartrate (LOPRESSOR) 25 MG tablet Take 0.5 tablets (12.5 mg total) by mouth 2 (two) times daily. (Patient taking differently: Take 12.5 mg by mouth 2 (two) times daily.) 30 tablet 0   Multiple Vitamin (MULTIVITAMIN WITH MINERALS) TABS tablet Take 1 tablet by mouth daily.     Multiple Vitamins-Minerals (EMERGEN-C IMMUNE) PACK Take 1 packet by mouth daily.     naloxegol oxalate (MOVANTIK) 25 MG TABS tablet Take 1 tablet (25 mg total) by mouth daily. 30 tablet 5   NARCAN 4 MG/0.1ML LIQD nasal spray kit Place 1 spray into the nose as needed (opioid overdose).   0   nitroGLYCERIN (NITROSTAT) 0.4 MG SL tablet SEE NOTES (Patient taking differently: Place 0.4 mg under the tongue every 5 (five) minutes as needed for chest pain.) 100 tablet 3   oxyCODONE-acetaminophen (PERCOCET) 10-325 MG tablet Take 1 tablet by mouth every 4 (four) hours as needed for pain.     potassium chloride SA (KLOR-CON) 20 MEQ tablet Take 20 mEq by mouth 2 (two) times daily.     REPATHA SURECLICK 132 MG/ML SOAJ ADMINISTER 1 ML UNDER THE SKIN EVERY 14 DAYS 2 mL 11   sertraline (ZOLOFT) 100 MG tablet Take 100 mg by mouth daily.     sitaGLIPtin (JANUVIA) 100 MG tablet Take 100 mg by mouth daily.     tamsulosin (FLOMAX) 0.4 MG CAPS capsule Take 1 capsule (0.4 mg total) by mouth daily after breakfast. 30 capsule 0   Vitamin D, Ergocalciferol, (DRISDOL) 1.25 MG (50000 UT) CAPS capsule Take 50,000 Units by mouth every 7 (seven) days. Monday     No current facility-administered medications for this visit.    Allergies as of 09/04/2020 - Review  Complete 09/04/2020  Allergen Reaction Noted   Gabapentin Hives    Ibuprofen Other (See Comments)    Zolpidem tartrate Other (See Comments)    Naproxen Other (See Comments)     Family History  Problem Relation Age of Onset   Diabetes Mother    Hypertension Mother    Heart attack Mother 23   Hypertension Father    Diabetes Father    Heart attack Father 95   Heart attack Other        mother, father, brother, sister all deceased due to MI   Heart attack Sister    Heart attack Brother    Seizures Brother    Heart failure Other    Colon cancer Neg Hx    Liver disease Neg Hx    Anesthesia problems Neg Hx    Hypotension Neg Hx    Malignant hyperthermia Neg Hx    Pseudochol deficiency Neg Hx    Colon polyps Neg Hx     Social History   Socioeconomic History   Marital status: Single    Spouse name: Not on file   Number of children: 2   Years of education: Not on file   Highest education level: Not on file  Occupational History   Occupation: disabled    Employer: NOT EMPLOYED  Tobacco Use   Smoking status: Never   Smokeless tobacco: Never  Vaping Use   Vaping Use: Never used  Substance and Sexual Activity   Alcohol use: No    Alcohol/week: 0.0 standard drinks   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 CV: Denies chest pain or palpitations. Resp: Denies dyspnea or cough. GI: See HPI Heme: See HPI  Physical Exam: BP (!) 142/85   Pulse 68   Temp (!)  97.3 F (36.3 C) (Temporal)   Ht _0  (1.778 m)   Wt 259 lb 12.8 oz (117.8 kg)   BMI 37.28 kg/m  General:   Alert and oriented. No distress noted. Pleasant and cooperative.  Head:  Normocephalic and atraumatic. Eyes:  Conjuctiva clear without scleral icterus. Heart:  S1, S2 present without murmurs appreciated. Lungs:  Clear to auscultation bilaterally. No wheezes, rales, or rhonchi. No distress.  Abdomen:  +BS, soft, non-tender and non-distended. No rebound or guarding. No HSM or masses noted. Msk:  Symmetrical without gross deformities. Normal posture. Extremities:  Without edema. Neurologic:  Alert and  oriented x4 Psych:  Normal mood and affect.    Assessment: 60 year old male presenting today for follow-up of lower abdominal pain, constipation, and diarrhea.  Also discussed need for surveillance colonoscopy due to history of colon polyps and history of GERD.   OIC: Chronic history of opioid-induced constipation with associated lower abdominal pain requiring multiagent management with Movantik, Linzess as needed, and mag citrate as needed.  Noted dairy products caused looser stools in January 2022.  Movantik was decreased to 12.5 mg at that time.    Currently with uncontrolled constipation taking Movantik 12.5 mg daily and 2-3 Linzess 145 mcg capsules every few days to make his bowels move.  Associated abdominal discomfort, bloating that improves once his bowels moved.  Denies BRBPR or melena.  He does have documented 16 pound weight loss over the last 6 months which she reports is secondary to dietary adjustments in efforts to control his diabetes.  Notably, he does have history of colon polyps and is overdue for surveillance colonoscopy.  We will focus on getting better management of constipation and help to schedule colonoscopy in the near future.  GERD: Fairly well controlled on Dexilant daily.  Breakthrough 1-2 times a week.  No dysphagia.  Plan: Increase Movantik to 25 mg  daily. Take Linzess 145 mcg daily. Requested progress report in 2 weeks.  If constipation is improving, we will go ahead and schedule surveillance colonoscopy with propofol with Dr. Abbey Chatters. The risks, benefits, and alternatives have been discussed with the patient in detail. The patient states understanding and desires to proceed.        ASA III       We would need to reach out to cardiology to get the okay to hold Plavix x5 days prior to procedure.        We will also provide separate instructions for diabetes medication adjustments.        UDS at pre-op Continue Dexilant 60 mg daily. May use Tums or over-the-counter Pepcid as needed for breakthrough symptoms. Reinforced GERD diet/lifestyle. Follow-up in 3 to 4 months.  Sooner if needed.   Aliene Altes, PA-C [facility] Gastroenterology 09/04/2020

## 2020-09-04 ENCOUNTER — Ambulatory Visit (INDEPENDENT_AMBULATORY_CARE_PROVIDER_SITE_OTHER): Payer: Medicare Other | Admitting: Gastroenterology

## 2020-09-04 ENCOUNTER — Other Ambulatory Visit: Payer: Self-pay

## 2020-09-04 ENCOUNTER — Encounter: Payer: Self-pay | Admitting: Gastroenterology

## 2020-09-04 VITALS — BP 142/85 | HR 68 | Temp 97.3°F | Ht 70.0 in | Wt 259.8 lb

## 2020-09-04 DIAGNOSIS — Z86010 Personal history of colonic polyps: Secondary | ICD-10-CM

## 2020-09-04 DIAGNOSIS — K219 Gastro-esophageal reflux disease without esophagitis: Secondary | ICD-10-CM

## 2020-09-04 DIAGNOSIS — R1030 Lower abdominal pain, unspecified: Secondary | ICD-10-CM

## 2020-09-04 DIAGNOSIS — K5900 Constipation, unspecified: Secondary | ICD-10-CM | POA: Diagnosis not present

## 2020-09-04 NOTE — Patient Instructions (Signed)
For constipation: -  Resume taking a full tablet of Movantik daily (25 mg total). -  Also go ahead and take Linzess 145 mcg daily.   -Call with a progress report in 2 weeks to let me know how you are doing.  If your constipation is well controlled, we will go ahead and get you scheduled for colonoscopy.  If you are not doing well, we will make additional medication adjustments.  For GERD:  - Continue Dexilant daily.  - You may use tums or overt he counter pepcid as needed for breakthrough symptoms.   General GERD diet/lifestyle recommendations: Avoid fried, fatty, greasy, spicy, citrus foods. Avoid caffeine and carbonated beverages. Avoid chocolate. Try eating 4-6 small meals a day rather than 3 large meals. Do not eat within 3 hours of laying down. Prop head of bed up on wood or bricks to create a 6 inch incline.  Follow-up in 3- 4 months. Hopefully we will have your colonoscopy completed prior to this. Do not hesitate to call with questions or concerns.   It was great to meet you today!  Aliene Altes, PA-C [facility] Gastroenterology

## 2020-10-13 ENCOUNTER — Telehealth: Payer: Self-pay | Admitting: Internal Medicine

## 2020-10-13 NOTE — Telephone Encounter (Signed)
Lmom for pt to call back. 

## 2020-10-13 NOTE — Telephone Encounter (Signed)
Pt phoned stating he is having trouble with having a bowel movement. Pt states he is taking his Linzess and Movantik everyday. States he has to strain a lot and when he does it hurts his left side of abd down to his hip. States he has no nausea/fever. Just abd pain the last 3 days. (He hasn't had a bowel movement in 3 days). He is bloated and very uncomfortable. He is asking can he add another Linzess to what he is taking?

## 2020-10-13 NOTE — Telephone Encounter (Signed)
[place], HE IS HAVING AN ISSUE CAUSE BY HIS CONSTIPATION

## 2020-10-13 NOTE — Telephone Encounter (Signed)
Tried reaching out to patient, but had to leave a message requesting return call.  Overall, symptoms seem consistent with his chronic constipation. Similar symptoms at his last OV.  I recommended increasing Movantik to 25 mg daily and starting Linzess 145 mcg every day.  [place]:  Please verify that patient is still passing gas. Please verify that patient is taking Movantik 25 mg rather than 12.5 mg. Verify that he is taking Linzess 145 mcg every day.

## 2020-10-14 ENCOUNTER — Encounter (INDEPENDENT_AMBULATORY_CARE_PROVIDER_SITE_OTHER): Payer: Medicare Other | Admitting: Ophthalmology

## 2020-10-15 NOTE — Telephone Encounter (Signed)
Phoned the pt X2 and the phone on his end hung up

## 2020-10-17 NOTE — Telephone Encounter (Signed)
Letter mailed to pt to contact office

## 2020-10-31 ENCOUNTER — Other Ambulatory Visit: Payer: Self-pay | Admitting: Interventional Cardiology

## 2020-11-18 ENCOUNTER — Other Ambulatory Visit: Payer: Self-pay | Admitting: Cardiology

## 2020-11-29 ENCOUNTER — Other Ambulatory Visit: Payer: Self-pay | Admitting: Cardiovascular Disease

## 2020-12-17 ENCOUNTER — Ambulatory Visit: Payer: Medicare Other | Admitting: Internal Medicine

## 2020-12-18 ENCOUNTER — Other Ambulatory Visit: Payer: Self-pay | Admitting: Interventional Cardiology

## 2020-12-29 ENCOUNTER — Ambulatory Visit: Payer: Medicare Other | Admitting: Gastroenterology

## 2020-12-29 ENCOUNTER — Encounter: Payer: Self-pay | Admitting: Internal Medicine

## 2021-01-07 ENCOUNTER — Ambulatory Visit: Payer: Medicare Other | Admitting: Gastroenterology

## 2021-01-12 NOTE — Progress Notes (Deleted)
Office Visit    Patient Name: Dalton [name] Date of Encounter: 01/12/2021  PCP:  Nolene Ebbs, MD   [place]  Cardiologist:  Sinclair Grooms, MD  Advanced Practice Provider:  No care team member to display Electrophysiologist:  None    Chief Complaint    Dalton [name] is a 60 y.o. male with a hx of single-vessel CABG in 2002 (LIMA to LAD) with prior cardiac catheterization, multiple MIs perhaps in the setting of cocaine use, ischemic cardiomyopathy (last EF greater than 50%), HTN, HLD, type 2 diabetes mellitus, left subclavian stent, rheumatoid arthritis, DVT, peptic ulcer disease, bipolar disorder, and history of cocaine use presents today for his annual follow-up appointment.  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[place])     last seizure in 2011;unknown etiology-pt sts heriditary (12/24/2015)   DZHGDJME(268.3)    "a few times/week" (12/24/2015)   High cholesterol    HNP (herniated nucleus pulposus), cervical    Hypertension    Laceration of right hand 11/27/2010   Laceration of wrist 2007 BIL FOREARMS   MI (myocardial infarction) ([place])    7, last one was in 2011 (12/24/2015)   Neuromuscular disorder ([place])    legs/feet   Neuropathy    legs/feet   NSAID-induced gastric ulcer    "Ibuprofen"   PUD (peptic ulcer disease)    in 1990s, secondary to medication   Rheumatoid arthritis ([place])    "all over"  (12/24/2015)   Shortness of breath    with exertion   Tonsillitis, chronic    Dr. Vicki Mallet in [place]   Type II diabetes mellitus ([place])    Wears glasses    Past Surgical History:  Procedure Laterality Date   ANTERIOR CERVICAL DECOMP/DISCECTOMY FUSION N/A 11/05/2016   Procedure: Anterior Cervical Discectomy and Fusion - Cervical three-Cervical four;  Surgeon: Earnie Larsson, MD;  Location: [facility];  Service: Neurosurgery;  Laterality: N/A;   BACK SURGERY     x3   BALLOON DILATION N/A 12/04/2019   Procedure: BALLOON DILATION;  Surgeon: Eloise Harman, DO;  [facility];  Service: Endoscopy;  Laterality: N/A;   BIOPSY N/A 05/30/2012   Procedure: BIOPSY;  Surgeon: Danie Binder, MD;  [facility];  Service: Endoscopy;  Laterality: N/A;   BIOPSY  03/02/2016   Procedure: BIOPSY;  Surgeon: Danie Binder, MD;  [facility];  Service: Endoscopy;;  gastric   BIOPSY  12/04/2019   Procedure: BIOPSY;  Surgeon: Eloise Harman, DO;  [facility];  Service: Endoscopy;;   CARDIAC CATHETERIZATION  "several"   CARPAL TUNNEL RELEASE Bilateral    CATARACT EXTRACTION W/PHACO Right 06/15/2016   Procedure: CATARACT EXTRACTION PHACO AND INTRAOCULAR LENS PLACEMENT ([place]);  Surgeon: Elta Guadeloupe  Gershon Crane, MD;  Location: [facility];  Service: Ophthalmology;  Laterality: Right;  CDE: 4.64   CATARACT EXTRACTION W/PHACO Left 07/13/2016   Procedure: CATARACT EXTRACTION PHACO AND INTRAOCULAR LENS PLACEMENT ([facility]);  Surgeon: Rutherford Guys, MD;  [facility];  Service: Ophthalmology;  Laterality: Left;  CDE: 3.15   COLONOSCOPY  12/28/2010   SLF: (MAC)Internal hemorrhoids/four small colon polyps tubular adenomas. per SLF: colonoscopy 2022   COLONOSCOPY WITH PROPOFOL N/A 07/05/2017   Surgeon: Danie Binder, MD; tubular adenoma, hyperplastic polyp, submucosal nodule at hepatic flexure consistent with lipoma on biopsy, diverticulosis and rectosigmoid and sigmoid colon, rectal bleeding due  to large internal hemorrhoids.  Recommended repeat in 3 years due to history of polyps and oily film on scope.   CORONARY ARTERY BYPASS GRAFT  2002   3 vessels   ESOPHAGOGASTRODUODENOSCOPY N/A 05/30/2012   SLF: UNCONTROLLED GERD DUE TO LIFESTYLE CHOICE/WEIGHT GAIN/MILD Non-erosive gastritis   ESOPHAGOGASTRODUODENOSCOPY (EGD) WITH PROPOFOL N/A 03/02/2016   Dr. Oneida Alar: Esophagus appeared normal, impaired dilation performed, patchy inflammation with edema and erythema of the entire stomach., Biopsy with H pylori, patient completed Pylera.    ESOPHAGOGASTRODUODENOSCOPY (EGD) WITH PROPOFOL N/A 12/04/2019   Surgeon: Eloise Harman, DO; benign appearing esophageal stenosis dilated, gastritis biopsied (mild reactive gastropathy, gastritis, negative H. pylori), normal duodenum.   FRACTURE SURGERY     LEFT HEART CATHETERIZATION WITH CORONARY ANGIOGRAM N/A 08/31/2011   Procedure: LEFT HEART CATHETERIZATION WITH CORONARY ANGIOGRAM;  Surgeon: Laverda Page, MD;  Location[facility];  Service: Cardiovascular;  Laterality: N/A;   LUMBAR DISC SURGERY     "L4-5; Dr. Trenton Gammon"   LUMBAR LAMINECTOMY/DECOMPRESSION MICRODISCECTOMY Right 03/06/2019   Procedure: MICRODISCECTOMY EXTRAFORAMINAL LUMBAR THREE - LUMBAR FOUR RIGHT;  Surgeon: Earnie Larsson, MD;  Location: [facility];  Service: Neurosurgery;  Laterality: Right;  MICRODISCECTOMY EXTRAFORAMINAL LUMBAR THREE - LUMBAR FOUR RIGHT   MULTIPLE TOOTH EXTRACTIONS     ORIF MANDIBULAR FRACTURE N/A 12/19/2015   Procedure: OPEN REDUCTION INTERNAL FIXATION (ORIF) MANDIBULAR FRACTURE;  Surgeon: Izora Gala, MD;  Location: [facility];  Service: ENT;  Laterality: N/A;   PATELLA FRACTURE SURGERY Left 1976   plate to knee cap from accident   POLYPECTOMY  07/05/2017   Procedure: POLYPECTOMY;  Surgeon: Danie Binder, MD;  [facility];  Service: Endoscopy;;  colon   SAVORY DILATION  12/28/2010   SLF:(MAC)J-shaped stomach/nodular mocosa in the distal esophagus/empiric  dilation 78m   SAVORY DILATION N/A 03/02/2016   Procedure: SAVORY DILATION;  Surgeon: SDanie Binder MD;  [facility];  Service: Endoscopy;  Laterality: N/A;    Allergies  Allergies  Allergen Reactions   Gabapentin Hives   Ibuprofen Other (See Comments)    Stomach  Upset and stomach ulcers   Zolpidem Tartrate Other (See Comments)    Hallucinations    Naproxen Other (See Comments)    HALLUCINATIONS    History of Present Illness    GRYOMA NOFZIGERis a 60y.o. male with a hx of hx of single-vessel CABG in 2002 (LIMA to LAD) with prior cardiac catheterization, multiple MIs perhaps in the setting of cocaine use, ischemic cardiomyopathy (last EF greater than 50%), HTN, HLD, type 2 diabetes mellitus, left subclavian stent, rheumatoid arthritis, DVT, peptic ulcer disease, bipolar disorder, and history of cocaine use presents today for his annual follow-up appointment.He was   last seen by Dr. STamala Julianon 09/19/2019.  He underwent an echocardiogram at that time which showed an estimated left ventricular ejection  fraction of 60 to 65%.  He did have some moderate left ventricular hypertrophy as well as grade 1 diastolic dysfunction.  There was mild thickening of the mitral valve leaflets and trivial mitral valve regurgitation.  He also underwent coronary CT scan which showed that he had a calcium score of 0.  No evidence of CAD.  Last lipid panel was done June 2021 which showed total cholesterol of 131 but elevated triglycerides of 255.  LDL at that time was 54.  Kidney function and liver function were stable.  Today, he ***   EKGs/Labs/Other Studies Reviewed:   The following studies were reviewed today: ***  EKG:  EKG is *** ordered today.  The ekg ordered today demonstrates ***  Recent Labs: No results found for requested labs within last 8760 hours.  Recent Lipid Panel    Component Value Date/Time   CHOL 131 08/14/2019 1238   TRIG 255 (H) 08/14/2019 1238   HDL 37 (L)  08/14/2019 1238   CHOLHDL 3.5 08/14/2019 1238   CHOLHDL 2.0 12/26/2017 1112   VLDL 69 (H) 12/16/2015 0630   LDLCALC 54 08/14/2019 1238   LDLCALC 10 12/26/2017 1112    Home Medications   No outpatient medications have been marked as taking for the 01/13/21 encounter (Appointment) with Richardson Dopp T, PA-C.     Review of Systems   ***   All other systems reviewed and are otherwise negative except as noted above.  Physical Exam    VS:  There were no vitals taken for this visit. , BMI There is no height or weight on file to calculate BMI.  Wt Readings from Last 3 Encounters:  09/04/20 259 lb 12.8 oz (117.8 kg)  07/30/20 280 lb (127 kg)  03/13/20 275 lb 12.8 oz (125.1 kg)     GEN: Well nourished, well developed, in no acute distress. HEENT: normal. Neck: Supple, no JVD, carotid bruits, or masses. Cardiac: ***RRR, no murmurs, rubs, or gallops. No clubbing, cyanosis, edema.  ***Radials/PT 2+ and equal bilaterally.  Respiratory:  ***Respirations regular and unlabored, clear to auscultation bilaterally. GI: Soft, nontender, nondistended. MS: No deformity or atrophy. Skin: Warm and dry, no rash. Neuro:  Strength and sensation are intact. Psych: Normal affect.  Assessment & Plan    CAD-CABG back in 2002 LIMA to LAD.  Recent coronary CT revealed a calcium score of 0.  GDMT: Lipitor, Zetia, Repatha, Plavix, metoprolol, and lisinopril  Atypical chest pain  Bipolar disease  Hyperlipidemia-continue Lipitor, Zetia, and Repatha.  Lipid panel from June 2021 showed LDL of 54 and total cholesterol of 131.  Triglycerides were elevated at 255.  Repeat lipid panel and LFTs.  Hypertension-well-controlled today and at home.  Continue low-sodium diet.  Continue to take blood pressures daily at home.  Continue lisinopril and Norvasc.  Ischemic cardiomyopathy-recent echocardiogram performed August 2021 revealed EF of 60-65% with grade 1 diastolic dysfunction.  Disposition: Follow up {follow  up:[id number]} with Sinclair Grooms, MD or APP.  Signed, Elgie Collard, PA-C 01/12/2021, 8:43 PM [facility]

## 2021-01-13 ENCOUNTER — Ambulatory Visit: Payer: Medicare Other | Admitting: Physician Assistant

## 2021-01-13 DIAGNOSIS — I25119 Atherosclerotic heart disease of native coronary artery with unspecified angina pectoris: Secondary | ICD-10-CM

## 2021-01-13 DIAGNOSIS — F314 Bipolar disorder, current episode depressed, severe, without psychotic features: Secondary | ICD-10-CM

## 2021-01-13 DIAGNOSIS — I10 Essential (primary) hypertension: Secondary | ICD-10-CM

## 2021-01-13 DIAGNOSIS — E785 Hyperlipidemia, unspecified: Secondary | ICD-10-CM

## 2021-01-18 NOTE — Progress Notes (Signed)
Cardiology Office Note:    Date:  01/20/2021   ID:  Dalton [name], DOB 1960/07/11, MRN [id number]  PCP:  Nolene Ebbs, MD  Cardiologist:  Sinclair Grooms, MD   Referring MD: Nolene Ebbs, MD   Chief Complaint  Patient presents with   Follow-up    Microvascular angina/decreased coronary reserve    History of Present Illness:    Dalton [name] is a 60 y.o. male with a hx of CAD with prior single-vessel CABG 2002 (LIMA to LAD) with prior cardiac catheterization showing atretic LIMA with normal native coronary arteries, multiple prior MIs perhaps in the setting of cocaine use, ischemic cardiomyopathy (last EF greater than 50%), HTN, HLD, type 2 diabetes mellitus, left subclavian stent, rheumatoid arthritis, DVT, peptic ulcer disease, bipolar disorder, and past cocaine use.      He still has limitations related to exertional chest discomfort.  The chest gets tight, he has to stop and rest, and it gradually eases off.  He is compliant with his medication regimen.  No longer uses illicit substances.  Compliant with medications.  He is attempting to do some walking.  Encouraged him to continue these efforts.  He has sildenafil listed as a medication that he uses as needed.  He is also on long-acting isosorbide, 15 mg/day.  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[place])     last seizure in 2011;unknown etiology-pt sts heriditary (12/24/2015)   WOEHOZYY(482.5)    "a few  times/week" (12/24/2015)   High cholesterol    HNP (herniated nucleus pulposus), cervical    Hypertension    Laceration of right hand 11/27/2010   Laceration of wrist 2007 BIL FOREARMS   MI (myocardial infarction) ([place])    7, last one was in 2011 (12/24/2015)   Neuromuscular disorder ([place])    legs/feet   Neuropathy    legs/feet   NSAID-induced gastric ulcer    "Ibuprofen"   PUD (peptic ulcer disease)    in 1990s, secondary to medication   Rheumatoid arthritis ([place])    "all over" (12/24/2015)   Shortness of breath    with exertion   Tonsillitis, chronic    Dr. Vicki Mallet in [place]   Type II diabetes mellitus ([place])    Wears glasses     Past Surgical History:  Procedure Laterality Date   ANTERIOR CERVICAL DECOMP/DISCECTOMY FUSION N/A 11/05/2016   Procedure: Anterior Cervical Discectomy and Fusion - Cervical three-Cervical four;  Surgeon: Earnie Larsson, MD;  Location: [facility];  Service: Neurosurgery;  Laterality: N/A;   BACK SURGERY     x3   BALLOON DILATION N/A 12/04/2019   Procedure: BALLOON DILATION;  Surgeon: Eloise Harman, DO;  [facility];  Service: Endoscopy;  Laterality: N/A;   BIOPSY N/A 05/30/2012   Procedure: BIOPSY;  Surgeon: Marga Melnick  Fields, MD;  [facility];  Service: Endoscopy;  Laterality: N/A;   BIOPSY  03/02/2016   Procedure: BIOPSY;  Surgeon: Danie Binder, MD;  [facility];  Service: Endoscopy;;  gastric   BIOPSY  12/04/2019   Procedure: BIOPSY;  Surgeon: Eloise Harman, DO;  [facility];  Service: Endoscopy;;   CARDIAC CATHETERIZATION  "several"   CARPAL TUNNEL RELEASE Bilateral    CATARACT EXTRACTION W/PHACO Right 06/15/2016   Procedure: CATARACT EXTRACTION PHACO AND INTRAOCULAR LENS PLACEMENT ([place]);  Surgeon: Rutherford Guys, MD;  Location[facility];  Service: Ophthalmology;  Laterality: Right;  CDE: 4.64   CATARACT EXTRACTION W/PHACO Left 07/13/2016   Procedure: CATARACT EXTRACTION PHACO AND INTRAOCULAR LENS  PLACEMENT (I[facility]);  Surgeon: Rutherford Guys, MD;  [facility];  Service: Ophthalmology;  Laterality: Left;  CDE: 3.15   COLONOSCOPY  12/28/2010   SLF: (MAC)Internal hemorrhoids/four small colon polyps tubular adenomas. per SLF: colonoscopy 2022   COLONOSCOPY WITH PROPOFOL N/A 07/05/2017   Surgeon: Danie Binder, MD; tubular adenoma, hyperplastic polyp, submucosal nodule at hepatic flexure consistent with lipoma on biopsy, diverticulosis and rectosigmoid and sigmoid colon, rectal bleeding due to large internal hemorrhoids.  Recommended repeat in 3 years due to history of polyps and oily film on scope.   CORONARY ARTERY BYPASS GRAFT  2002   3 vessels   ESOPHAGOGASTRODUODENOSCOPY N/A 05/30/2012   SLF: UNCONTROLLED GERD DUE TO LIFESTYLE CHOICE/WEIGHT GAIN/MILD Non-erosive gastritis   ESOPHAGOGASTRODUODENOSCOPY (EGD) WITH PROPOFOL N/A 03/02/2016   Dr. Oneida Alar: Esophagus appeared normal, impaired dilation performed, patchy inflammation with edema and erythema of the entire stomach., Biopsy with H pylori, patient completed Pyl[place].    ESOPHAGOGASTRODUODENOSCOPY (EGD) WITH PROPOFOL N/A 12/04/2019   Surgeon: Eloise Harman, DO; benign appearing esophageal stenosis dilated, gastritis biopsied (mild reactive gastropathy, gastritis, negative H. pylori), normal duodenum.   FRACTURE SURGERY     LEFT HEART CATHETERIZATION WITH CORONARY ANGIOGRAM N/A 08/31/2011   Procedure: LEFT HEART CATHETERIZATION WITH CORONARY ANGIOGRAM;  Surgeon: Laverda Page, MD;  [facility];  Service: Cardiovascular;  Laterality: N/A;   LUMBAR DISC SURGERY     "L4-5; Dr. Trenton Gammon"   LUMBAR LAMINECTOMY/DECOMPRESSION MICRODISCECTOMY Right 03/06/2019   Procedure: MICRODISCECTOMY EXTRAFORAMINAL LUMBAR THREE - LUMBAR FOUR RIGHT;  Surgeon: Earnie Larsson, MD;  Location: [facility];  Service: Neurosurgery;  Laterality: Right;  MICRODISCECTOMY EXTRAFORAMINAL LUMBAR THREE - LUMBAR FOUR RIGHT   MULTIPLE TOOTH EXTRACTIONS     ORIF  MANDIBULAR FRACTURE N/A 12/19/2015   Procedure: OPEN REDUCTION INTERNAL FIXATION (ORIF) MANDIBULAR FRACTURE;  Surgeon: Izora Gala, MD;  Location: [facility];  Service: ENT;  Laterality: N/A;   PATELLA FRACTURE SURGERY Left 1976   plate to knee cap from accident   POLYPECTOMY  07/05/2017   Procedure: POLYPECTOMY;  Surgeon: Danie Binder, MD;  [facility];  Service: Endoscopy;;  colon   SAVORY DILATION  12/28/2010   SLF:(MAC)J-shaped stomach/nodular mocosa in the distal esophagus/empiric dilation 17m   SAVORY DILATION N/A 03/02/2016   Procedure: SAVORY DILATION;  Surgeon: SDanie Binder MD;  [facility];  Service: Endoscopy;  Laterality: N/A;    Current Medications: Current Meds  Medication Sig   albuterol (PROVENTIL HFA;VENTOLIN HFA) 108 (90 Base) MCG/ACT inhaler Inhale 2 puffs into the lungs every 6 (six) hours as needed for wheezing or shortness of breath.    albuterol (PROVENTIL) (2.5 MG/3ML) 0.083% nebulizer solution Take 2.5 mg by nebulization every 6 (six) hours as needed for  wheezing or shortness of breath.    ALPRAZolam (XANAX) 1 MG tablet Take 1 mg by mouth 3 (three) times daily.    amLODipine (NORVASC) 5 MG tablet TAKE 1 TABLET(5 MG) BY MOUTH DAILY   atorvastatin (LIPITOR) 80 MG tablet Take 80 mg by mouth at bedtime.   beclomethasone (QVAR) 80 MCG/ACT inhaler Inhale 2 puffs into the lungs 2 (two) times daily as needed (respiratory issues.).    brimonidine (ALPHAGAN P) 0.1 % SOLN Place 1 drop into the right eye 2 (two) times daily.   cetirizine (ZYRTEC) 10 MG tablet Take 10 mg by mouth daily as needed for allergies.    clopidogrel (PLAVIX) 75 MG tablet Take 75 mg by mouth daily.   dexlansoprazole (DEXILANT) 60 MG capsule Take 1 capsule (60 mg total) by mouth daily.   ezetimibe (ZETIA) 10 MG tablet Take 10 mg by mouth daily.   fluticasone (FLONASE) 50 MCG/ACT nasal spray Place 2 sprays into both nostrils daily.   Fluticasone Furoate (ARNUITY ELLIPTA) 200  MCG/ACT AEPB Inhale 1 puff into the lungs daily as needed (shortness of breath).    furosemide (LASIX) 40 MG tablet Take 1 tablet (40 mg total) by mouth daily.   glipiZIDE (GLUCOTROL) 10 MG tablet Take 10 mg by mouth daily before breakfast.   hydrochlorothiazide (HYDRODIURIL) 25 MG tablet Take 25 mg by mouth daily.   ipratropium (ATROVENT) 0.02 % nebulizer solution Take 0.5 mg by nebulization 2 (two) times daily as needed for wheezing or shortness of breath (for congestion).    ketoconazole (NIZORAL) 2 % shampoo Apply 1 application topically daily as needed for irritation.    latanoprost (XALATAN) 0.005 % ophthalmic solution 1 drop at bedtime.   linaclotide (LINZESS) 145 MCG CAPS capsule Take 145 mcg by mouth daily as needed. 3-4 times a week.   lisinopril (PRINIVIL,ZESTRIL) 10 MG tablet Take 10 mg by mouth daily.   Liver Extract (LIVER PO) Take 1 tablet by mouth daily. "Liver Aid"   magnesium citrate SOLN Take 1 Bottle by mouth as needed for moderate constipation.    metFORMIN (GLUCOPHAGE-XR) 500 MG 24 hr tablet Take 500 mg by mouth daily with breakfast.    methocarbamol (ROBAXIN) 500 MG tablet Take 500 mg by mouth every 6 (six) hours as needed.   methylPREDNISolone (MEDROL DOSEPAK) 4 MG TBPK tablet See admin instructions. follow package directions   Multiple Vitamin (MULTIVITAMIN WITH MINERALS) TABS tablet Take 1 tablet by mouth daily.   Multiple Vitamins-Minerals (EMERGEN-C IMMUNE) PACK Take 1 packet by mouth daily.   naloxegol oxalate (MOVANTIK) 25 MG TABS tablet Take 1 tablet (25 mg total) by mouth daily.   NARCAN 4 MG/0.1ML LIQD nasal spray kit Place 1 spray into the nose as needed (opioid overdose).    nitroGLYCERIN (NITROSTAT) 0.4 MG SL tablet SEE NOTES (Patient taking differently: Place 0.4 mg under the tongue every 5 (five) minutes as needed for chest pain.)   oxyCODONE-acetaminophen (PERCOCET) 10-325 MG tablet Take 1 tablet by mouth every 4 (four) hours as needed for pain.   potassium  chloride SA (KLOR-CON) 20 MEQ tablet Take 20 mEq by mouth 2 (two) times daily.   REPATHA SURECLICK 638 MG/ML SOAJ ADMINISTER 1 ML UNDER THE SKIN EVERY 14 DAYS   sertraline (ZOLOFT) 100 MG tablet Take 100 mg by mouth daily.   sildenafil (VIAGRA) 100 MG tablet Take 100 mg by mouth daily as needed.   sitaGLIPtin (JANUVIA) 100 MG tablet Take 100 mg by mouth daily.   sucralfate (CARAFATE) 1  GM/10ML suspension in the morning, at noon, in the evening, and at bedtime.   tamsulosin (FLOMAX) 0.4 MG CAPS capsule Take 1 capsule (0.4 mg total) by mouth daily after breakfast.   Vitamin D, Ergocalciferol, (DRISDOL) 1.25 MG (50000 UT) CAPS capsule Take 50,000 Units by mouth every 7 (seven) days. Monday   [DISCONTINUED] isosorbide mononitrate (IMDUR) 30 MG 24 hr tablet TAKE 1/2 TABLET(15 MG) BY MOUTH AT BEDTIME   [DISCONTINUED] metoprolol tartrate (LOPRESSOR) 25 MG tablet Take 0.5 tablets (12.5 mg total) by mouth 2 (two) times daily. (Patient taking differently: Take 12.5 mg by mouth 2 (two) times daily.)     Allergies:   Gabapentin, Ibuprofen, Zolpidem tartrate, and Naproxen   Social History   Socioeconomic History   Marital status: Single    Spouse name: Not on file   Number of children: 2   Years of education: Not on file   Highest education level: Not on file  Occupational History   Occupation: disabled    Employer: NOT EMPLOYED  Tobacco Use   Smoking status: Never   Smokeless tobacco: Never  Vaping Use   Vaping Use: Never used  Substance and Sexual Activity   Alcohol use: No    Alcohol/week: 0.0 standard drinks   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)   Sexual activity: Not Currently  Other Topics Concern   Not on file  Social History Narrative   Lives w/ son-23/22   Social Determinants of Health   Financial Resource Strain: Not on file  Food Insecurity: Not on file  Transportation Needs: Not on file   Physical Activity: Not on file  Stress: Not on file  Social Connections: Not on file     Family History: The patient's family history includes Diabetes in his father and mother; Heart attack in his brother, sister, and another family member; Heart attack (age of onset: 87) in his father; Heart attack (age of onset: 52) in his mother; Heart failure in an other family member; Hypertension in his father and mother; Seizures in his brother. There is no history of Colon cancer, Liver disease, Anesthesia problems, Hypotension, Malignant hyperthermia, Pseudochol deficiency, or Colon polyps.  ROS:   Please see the history of present illness.    Somewhat depressed.  He would like to be off of some of his current medical therapy.  All other systems reviewed and are negative.  EKGs/Labs/Other Studies Reviewed:    The following studies were reviewed today:  CORONARY CTA 2021: IMPRESSION: 1. Coronary calcium score of 0. This was 0 percentile for age and sex matched control.   2. Normal coronary origin with right dominance.   3. No evidence of CAD.  ECHOCARDIOGRAM 2021: IMPRESSIONS     1. Left ventricular ejection fraction, by estimation, is 60 to 65%. The  left ventricle has normal function. The left ventricle has no regional  wall motion abnormalities. There is moderate left ventricular hypertrophy.  Left ventricular diastolic  parameters are consistent with Grade I diastolic dysfunction (impaired  relaxation).   2. Right ventricular systolic function is normal. The right ventricular  size is normal. There is normal pulmonary artery systolic pressure.   3. The mitral valve is abnormal. Trivial mitral valve regurgitation.   4. The aortic valve is tricuspid. Aortic valve regurgitation is not  visualized.   5. The inferior vena cava is normal in size with greater than 50%  respiratory  variability, suggesting right atrial pressure of 3 mmHg.   Comparison(s): Changes from prior study are  noted. 06/30/2017: LVEF 60-65%,  mild LVH.   EKG:  EKG sinus rhythm, PACs, prominent voltage, nonspecific T wave flattening.  Compared to 2021.  Recent Labs: No results found for requested labs within last 8760 hours.  Recent Lipid Panel    Component Value Date/Time   CHOL 131 08/14/2019 1238   TRIG 255 (H) 08/14/2019 1238   HDL 37 (L) 08/14/2019 1238   CHOLHDL 3.5 08/14/2019 1238   CHOLHDL 2.0 12/26/2017 1112   VLDL 69 (H) 12/16/2015 0630   LDLCALC 54 08/14/2019 1238   LDLCALC 10 12/26/2017 1112    Physical Exam:    VS:  BP 132/72   Pulse 75   Ht _0  (1.778 m)   Wt 256 lb 12.8 oz (116.5 kg)   SpO2 100%   BMI 36.85 kg/m     Wt Readings from Last 3 Encounters:  01/20/21 256 lb 12.8 oz (116.5 kg)  09/04/20 259 lb 12.8 oz (117.8 kg)  07/30/20 280 lb (127 kg)     GEN: Obese. No acute distress HEENT: Normal NECK: No JVD. LYMPHATICS: No lymphadenopathy CARDIAC: No murmur. RRR no gallop, or edema. VASCULAR:  Normal Pulses. No bruits. RESPIRATORY:  Clear to auscultation without rales, wheezing or rhonchi  ABDOMEN: Soft, non-tender, non-distended, No pulsatile mass, MUSCULOSKELETAL: No deformity  SKIN: Warm and dry NEUROLOGIC:  Alert and oriented x 3 PSYCHIATRIC:  Normal affect   ASSESSMENT:    1. Coronary artery disease of bypass graft of native heart with stable angina pectoris ([place])   2. PAD (peripheral artery disease) ([place])   3. Mixed hyperlipidemia   4. Essential hypertension   5. Chronic heart failure with preserved ejection fraction (HCC)   6. 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  He has exertional discomfort.  He likely has fixed perfusion related to microvascular rarefaction and LVH.  Also noted that he uses sildenafil from time to time and is on chronic isosorbide mononitrate 15 mg/day.  Discontinue isosorbide.  Since angina is exertion related, will increase  metoprolol to 25 mg twice per day.  If angina worsens, perhaps there is also a vasoconstrictive component and beta-blocker dose will be further adjusted downward if needed. Did not discuss.  Encouraged continued physical activity/walking. Continue Lipitor 80 mg/day.  Target LDL less than 70. Excellent blood pressure control is required.  As we discontinue Imdur, we are increasing metoprolol.  We want to keep heart rate in the 55-65 range at rest. Consider SGLT2 therapy at some point in the future if dyspnea becomes a significant complaint.  Given his diabetes, it would be an excellent addition or replacement therapy for some of the current diabetic agents being used. He seems somewhat down today.  He acknowledges that he has potential for depression.   Overall education and awareness concerning secondary risk prevention was discussed in detail: LDL less than 70, hemoglobin A1c less than 7, blood pressure target less than 130/80 mmHg, >150 minutes of moderate aerobic activity per week, avoidance of smoking, weight control (via diet and exercise), and continued surveillance/management of/for obstructive sleep apnea.    Medication Adjustments/Labs and Tests Ordered: Current medicines are reviewed at length with the patient today.  Concerns regarding medicines are outlined above.  Orders Placed This Encounter  Procedures   EKG 12-Lead  Meds ordered this encounter  Medications   metoprolol tartrate (LOPRESSOR) 25 MG tablet    Sig: Take 1 tablet (25 mg total) by mouth 2 (two) times daily.    Dispense:  180 tablet    Refill:  3    Dose change.  D/c Imdur.     Patient Instructions  Medication Instructions:  1) DISCONTINUE Isosorbide 2) INCREASE Metoprolol Tartrate to 23m twice daily  *If you need a refill on your cardiac medications before your next appointment, please call your pharmacy*   Lab Work: None  If you have labs (blood work) drawn today and your tests are completely  normal, you will receive your results only by: [place](if you have MyChart) OR A paper copy in the mail If you have any lab test that is abnormal or we need to change your treatment, we will call you to review the results.   Testing/Procedures: None   Follow-Up: [facility] and your health needs are our priority.  As part of our continuing mission to provide you with exceptional heart care, we have created designated Provider Care Teams.  These Care Teams include your primary Cardiologist (physician) and Advanced Practice Providers (APPs -  Physician Assistants and Nurse Practitioners) who all work together to provide you with the care you need, when you need it.  We recommend signing up for the patient portal called "MyChart".  Sign up information is provided on this After Visit Summary.  MyChart is used to connect with patients for Virtual Visits (Telemedicine).  Patients are able to view lab/test results, encounter notes, upcoming appointments, etc.  Non-urgent messages can be sent to your provider as well.   To learn more about what you can do with MyChart, go to [email or web address]    Your next appointment:   6 month(s)  The format for your next appointment:   In Person  Provider:   HSinclair Grooms MD     Other Instructions     Signed, HSinclair Grooms MD  01/20/2021 9:32 AM    [place]

## 2021-01-20 ENCOUNTER — Ambulatory Visit (INDEPENDENT_AMBULATORY_CARE_PROVIDER_SITE_OTHER): Payer: Medicare Other | Admitting: Interventional Cardiology

## 2021-01-20 ENCOUNTER — Other Ambulatory Visit: Payer: Self-pay

## 2021-01-20 ENCOUNTER — Encounter: Payer: Self-pay | Admitting: Interventional Cardiology

## 2021-01-20 ENCOUNTER — Encounter (INDEPENDENT_AMBULATORY_CARE_PROVIDER_SITE_OTHER): Payer: Self-pay

## 2021-01-20 VITALS — BP 132/72 | HR 75 | Ht 70.0 in | Wt 256.8 lb

## 2021-01-20 DIAGNOSIS — I5032 Chronic diastolic (congestive) heart failure: Secondary | ICD-10-CM

## 2021-01-20 DIAGNOSIS — I10 Essential (primary) hypertension: Secondary | ICD-10-CM | POA: Diagnosis not present

## 2021-01-20 DIAGNOSIS — E782 Mixed hyperlipidemia: Secondary | ICD-10-CM | POA: Diagnosis not present

## 2021-01-20 DIAGNOSIS — F314 Bipolar disorder, current episode depressed, severe, without psychotic features: Secondary | ICD-10-CM

## 2021-01-20 DIAGNOSIS — I25708 Atherosclerosis of coronary artery bypass graft(s), unspecified, with other forms of angina pectoris: Secondary | ICD-10-CM | POA: Diagnosis not present

## 2021-01-20 DIAGNOSIS — I739 Peripheral vascular disease, unspecified: Secondary | ICD-10-CM | POA: Diagnosis not present

## 2021-01-20 MED ORDER — METOPROLOL TARTRATE 25 MG PO TABS
25.0000 mg | ORAL_TABLET | Freq: Two times a day (BID) | ORAL | 3 refills | Status: DC
Start: 2021-01-20 — End: 2021-05-14

## 2021-01-20 NOTE — Patient Instructions (Signed)
Medication Instructions:  1) DISCONTINUE Isosorbide 2) INCREASE Metoprolol Tartrate to 25mg  twice daily  *If you need a refill on your cardiac medications before your next appointment, please call your pharmacy*   Lab Work: None  If you have labs (blood work) drawn today and your tests are completely normal, you will receive your results only by: [place] (if you have MyChart) OR A paper copy in the mail If you have any lab test that is abnormal or we need to change your treatment, we will call you to review the results.   Testing/Procedures: None   Follow-Up: At [facility], you and your health needs are our priority.  As part of our continuing mission to provide you with exceptional heart care, we have created designated Provider Care Teams.  These Care Teams include your primary Cardiologist (physician) and Advanced Practice Providers (APPs -  Physician Assistants and Nurse Practitioners) who all work together to provide you with the care you need, when you need it.  We recommend signing up for the patient portal called "MyChart".  Sign up information is provided on this After Visit Summary.  MyChart is used to connect with patients for Virtual Visits (Telemedicine).  Patients are able to view lab/test results, encounter notes, upcoming appointments, etc.  Non-urgent messages can be sent to your provider as well.   To learn more about what you can do with MyChart, go to [email or web address].    Your next appointment:   6 month(s)  The format for your next appointment:   In Person  Provider:   Sinclair Grooms, MD     Other Instructions

## 2021-01-30 ENCOUNTER — Other Ambulatory Visit: Payer: Self-pay | Admitting: Gastroenterology

## 2021-01-30 DIAGNOSIS — K5900 Constipation, unspecified: Secondary | ICD-10-CM

## 2021-01-31 NOTE — Telephone Encounter (Signed)
Please clarify dose. Last ov note says 145 but refill request for 290

## 2021-02-02 NOTE — Telephone Encounter (Signed)
Contacted pharmacy and they stated the only dose on file was for the 290 mcg. Not sure if 145 mcg was ever sent in to pharmacy or if it was just documented in the pt's chart.

## 2021-03-11 ENCOUNTER — Other Ambulatory Visit: Payer: Self-pay | Admitting: Internal Medicine

## 2021-03-11 ENCOUNTER — Other Ambulatory Visit: Payer: Self-pay | Admitting: Interventional Cardiology

## 2021-03-17 LAB — CBC
HCT: 40.5 % (ref 38.5–50.0)
Hemoglobin: 13.8 g/dL (ref 13.2–17.1)
MCH: 29.7 pg (ref 27.0–33.0)
MCHC: 34.1 g/dL (ref 32.0–36.0)
MCV: 87.3 fL (ref 80.0–100.0)
MPV: 11.6 fL (ref 7.5–12.5)
Platelets: 251 10*3/uL (ref 140–400)
RBC: 4.64 10*6/uL (ref 4.20–5.80)
RDW: 13.7 % (ref 11.0–15.0)
WBC: 5.8 10*3/uL (ref 3.8–10.8)

## 2021-03-17 LAB — COMPLETE METABOLIC PANEL WITH GFR
AG Ratio: 1.6 (calc) (ref 1.0–2.5)
ALT: 14 U/L (ref 9–46)
AST: 11 U/L (ref 10–35)
Albumin: 4.1 g/dL (ref 3.6–5.1)
Alkaline phosphatase (APISO): 79 U/L (ref 35–144)
BUN: 10 mg/dL (ref 7–25)
CO2: 32 mmol/L (ref 20–32)
Calcium: 9.4 mg/dL (ref 8.6–10.3)
Chloride: 102 mmol/L (ref 98–110)
Creat: 0.91 mg/dL (ref 0.70–1.30)
Globulin: 2.5 g/dL (calc) (ref 1.9–3.7)
Glucose, Bld: 81 mg/dL (ref 65–99)
Potassium: 4.4 mmol/L (ref 3.5–5.3)
Sodium: 141 mmol/L (ref 135–146)
Total Bilirubin: 0.5 mg/dL (ref 0.2–1.2)
Total Protein: 6.6 g/dL (ref 6.1–8.1)
eGFR: 97 mL/min/{1.73_m2} (ref >=60)

## 2021-03-17 LAB — VITAMIN D 25 HYDROXY (VIT D DEFICIENCY, FRACTURES): Vit D, 25-Hydroxy: 66 ng/mL (ref 30–100)

## 2021-03-17 LAB — PSA: PSA: 0.47 ng/mL (ref <=4.00)

## 2021-03-17 LAB — LIPID PANEL
Cholesterol: 125 mg/dL (ref <200)
HDL: 59 mg/dL (ref >=40)
LDL Cholesterol (Calc): 42 mg/dL (calc)
Non-HDL Cholesterol (Calc): 66 mg/dL (calc) (ref <130)
Total CHOL/HDL Ratio: 2.1 (calc) (ref <5.0)
Triglycerides: 162 mg/dL — ABNORMAL HIGH (ref <150)

## 2021-03-17 LAB — TESTOSTERONE, FREE: TESTOSTERONE FREE: 54.7 pg/mL (ref 46.0–224.0)

## 2021-03-17 LAB — TSH: TSH: 1.81 mIU/L (ref 0.40–4.50)

## 2021-03-31 ENCOUNTER — Other Ambulatory Visit: Payer: Self-pay | Admitting: Cardiology

## 2021-05-06 ENCOUNTER — Ambulatory Visit: Payer: Medicare Other | Admitting: Gastroenterology

## 2021-05-14 ENCOUNTER — Ambulatory Visit (INDEPENDENT_AMBULATORY_CARE_PROVIDER_SITE_OTHER): Payer: Medicare Other | Admitting: Physician Assistant

## 2021-05-14 ENCOUNTER — Other Ambulatory Visit: Payer: Self-pay

## 2021-05-14 ENCOUNTER — Encounter: Payer: Self-pay | Admitting: Physician Assistant

## 2021-05-14 ENCOUNTER — Ambulatory Visit (INDEPENDENT_AMBULATORY_CARE_PROVIDER_SITE_OTHER): Payer: Medicare Other

## 2021-05-14 VITALS — BP 138/84 | HR 82 | Resp 16 | Ht 70.0 in | Wt 245.0 lb

## 2021-05-14 DIAGNOSIS — I10 Essential (primary) hypertension: Secondary | ICD-10-CM

## 2021-05-14 DIAGNOSIS — R002 Palpitations: Secondary | ICD-10-CM | POA: Diagnosis not present

## 2021-05-14 DIAGNOSIS — I25700 Atherosclerosis of coronary artery bypass graft(s), unspecified, with unstable angina pectoris: Secondary | ICD-10-CM

## 2021-05-14 DIAGNOSIS — E785 Hyperlipidemia, unspecified: Secondary | ICD-10-CM

## 2021-05-14 DIAGNOSIS — R0609 Other forms of dyspnea: Secondary | ICD-10-CM

## 2021-05-14 DIAGNOSIS — M546 Pain in thoracic spine: Secondary | ICD-10-CM | POA: Insufficient documentation

## 2021-05-14 MED ORDER — METOPROLOL TARTRATE 50 MG PO TABS: 50.0000 mg | ORAL_TABLET | Freq: Two times a day (BID) | ORAL | 0 refills | Status: DC

## 2021-05-14 NOTE — Patient Instructions (Addendum)
Medication Instructions:  ?INCREASE Metoprolol to '50mg'$  Take 1 tablet twice a day  ?*If you need a refill on your cardiac medications before your next appointment, please call your pharmacy* ? ? ?Lab Work: ?None Ordered ? ? ?Testing/Procedures: ?Your physician has requested that you have a lexiscan myoview. For further information please visit [email or web address]. Please follow instruction sheet, as given. ? ?ZIO XT- Long Term Monitor Instructions ? ?Your physician has requested you wear a ZIO patch monitor for 14 days.  ?This is a single patch monitor. [employer] supplies one patch monitor per enrollment. Additional ?stickers are not available. Please do not apply patch if you will be having a Nuclear Stress Test,  ?Echocardiogram, Cardiac CT, MRI, or Chest Xray during the period you would be wearing the  ?monitor. The patch cannot be worn during these tests. You cannot remove and re-apply the  ?ZIO XT patch monitor.  ?Your ZIO patch monitor will be mailed 3 day USPS to your address on file. It may take 3-5 days  ?to receive your monitor after you have been enrolled.  ?Once you have received your monitor, please review the enclosed instructions. Your monitor  ?has already been registered assigning a specific monitor serial # to you. ? ?Billing and Patient Assistance Program Information ? ?We have supplied [employer] with any of your insurance information on file for billing purposes. ?[employer] offers a sliding scale Patient Assistance Program for patients that do not have  ?insurance, or whose insurance does not completely cover the cost of the ZIO monitor.  ?You must apply for the Patient Assistance Program to qualify for this discounted rate.  ?To apply, please call [employer] at [phone], select option 4, select option 2, ask to apply for  ?Patient Assistance Program. Theodore Demark will ask your household income, and how many people  ?are in your household. They will quote your out-of-pocket cost based on that information.   ?[employer] will also be able to set up a 20-month interest-free payment plan if needed. ? ?Applying the monitor ?  ?Shave hair from upper left chest.  ?Hold abrader disc by orange tab. Rub abrader in 40 strokes over the upper left chest as  ?indicated in your monitor instructions.  ?Clean area with 4 enclosed alcohol pads. Let dry.  ?Apply patch as indicated in monitor instructions. Patch will be placed under collarbone on left  ?side of chest with arrow pointing upward.  ?Rub patch adhesive wings for 2 minutes. Remove white label marked "1". Remove the white  ?label marked "2". Rub patch adhesive wings for 2 additional minutes.  ?While looking in a mirror, press and release button in center of patch. A small green light will  ?flash 3-4 times. This will be your only indicator that the monitor has been turned on.  ?Do not shower for the first 24 hours. You may shower after the first 24 hours.  ?Press the button if you feel a symptom. You will hear a small click. Record Date, Time and  ?Symptom in the Patient Logbook.  ?When you are ready to remove the patch, follow instructions on the last 2 pages of Patient  ?Logbook. Stick patch monitor onto the last page of Patient Logbook.  ?Place Patient Logbook in the blue and white box. Use locking tab on box and tape box closed  ?securely. The blue and white box has prepaid postage on it. Please place it in the mailbox as  ?soon as possible. Your physician should have your test results approximately 7 days  after the  ?monitor has been mailed back to [facility].  ?Call [facility] at [phone] if you have questions regarding  ?your ZIO XT patch monitor. Call them immediately if you see an orange light blinking on your  ?monitor.  ?If your monitor falls off in less than 4 days, contact our Monitor department at [phone].  ?If your monitor becomes loose or falls off after 4 days call [employer] at [phone] for  ?suggestions on securing your  monitor ? ? ?Follow-Up: ?At [facility], you and your health needs are our priority.  As part of our continuing mission to provide you with exceptional heart care, we have created designated Provider Care Teams.  These Care Teams include your primary Cardiologist (physician) and Advanced Practice Providers (APPs -  Physician Assistants and Nurse Practitioners) who all work together to provide you with the care you need, when you need it. ? ?We recommend signing up for the patient portal called "MyChart".  Sign up information is provided on this After Visit Summary.  MyChart is used to connect with patients for Virtual Visits (Telemedicine).  Patients are able to view lab/test results, encounter notes, upcoming appointments, etc.  Non-urgent messages can be sent to your provider as well.   ?To learn more about what you can do with MyChart, go to [email or web address].   ? ?Your next appointment:   ?4 week(s) ? ?The format for your next appointment:   ?In Person ? ?Provider:   ?ANY APP available ? ?Other Instructions ?  ? ? ?

## 2021-05-14 NOTE — Progress Notes (Unsigned)
[id number] zio xt from office inventory applied to patient. ? ?Dr. Tamala Julian to read. ?

## 2021-05-14 NOTE — Progress Notes (Signed)
?Cardiology Office Note:   ? ?Date:  05/14/2021  ? ?ID:  Dalton [name], DOB 1960/03/16, MRN [id number] ? ?PCP:  Nolene Ebbs, MD  ?[facility] Cardiologist:  Sinclair Grooms, MD  ?[facility] Electrophysiologist:  None  ? ?Chief Complaint: chest pain  ? ?History of Present Illness:   ? ?Dalton [name] is a 61 y.o. 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  ? ?Echo 09/2019: LVEF 60-65% and grade 1 DD ?Coronary CT 09/2019 with calcium score of 0. No evidence of CAD.  ? ?Patient is here with multiple complaints.  He reported 7 to 10 days history of intermittent substernal chest pressure, shortness of breath and palpitation.  Symptom can occur together or separate.  This is different when he had CABG.  Reports compliance with his medication.  No dizziness, orthopnea, PND, syncope, lower extremity edema or melena. ? ?Past Medical History:  ?Diagnosis Date  ? Anemia   ? Anxiety   ? Asthma   ? Bipolar disorder ([place])   ? CHF (congestive heart failure) ([place])   ? Chronic back pain   ? Pain Clinic in [place]  ? Chronic bronchitis   ? Chronic lower back pain   ? Congestive heart failure (CHF) ([place])   ? COPD (chronic obstructive pulmonary disease) ([place])   ? per patient 03/05/19  ? Coronary artery disease   ? Coughing   ? Depression   ? DVT (deep venous thrombosis) ([place]) ~ 2005  ? LLE  ? Frequency of urination   ? GERD (gastroesophageal reflux disease)   ? 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  last seizure in 2011;unknown etiology-pt sts heriditary (12/24/2015)  ? Headache(784.0)   ? "a few times/week" (12/24/2015)  ?  High cholesterol   ? HNP (herniated nucleus pulposus), cervical   ? Hypertension   ? Laceration of right hand 11/27/2010  ? Laceration of wrist 2007 BIL FOREARMS  ? MI (myocardial infarction) ([place])   ? 7, last one was in 2011 (12/24/2015)  ? Neuromuscular disorder ([place])   ? legs/feet  ? Neuropathy   ? legs/feet  ? NSAID-induced gastric ulcer   ? "Ibuprofen"  ? PUD (peptic ulcer disease)   ? in 1990s, secondary to medication  ? Rheumatoid arthritis ([place])   ? "all over" (12/24/2015)  ? Shortness of breath   ? with exertion  ? Tonsillitis, chronic   ? Dr. Vicki Mallet in [place]  ? Type II diabetes mellitus ([place])   ? Wears glasses   ? ? ?Past Surgical History:  ?Procedure Laterality Date  ? ANTERIOR CERVICAL DECOMP/DISCECTOMY FUSION N/A 11/05/2016  ? Procedure: Anterior Cervical Discectomy and Fusion - Cervical three-Cervical four;  Surgeon: Earnie Larsson, MD;  Location: [place];  Service: Neurosurgery;  Laterality: N/A;  ? BACK SURGERY    ? x3  ? BALLOON DILATION N/A 12/04/2019  ? Procedure: BALLOON DILATION;  Surgeon: Eloise Harman, DO;  [facility];  Service: Endoscopy;  Laterality: N/A;  ? BIOPSY N/A 05/30/2012  ? Procedure: BIOPSY;  Surgeon: Danie Binder, MD;  [facility];  Service: Endoscopy;  Laterality: N/A;  ?  BIOPSY  03/02/2016  ? Procedure: BIOPSY;  Surgeon: Danie Binder, MD;  [facility];  Service: Endoscopy;;  gastric  ? BIOPSY  12/04/2019  ? Procedure: BIOPSY;  Surgeon: Eloise Harman, DO;  [facility];  Service: Endoscopy;;  ? CARDIAC CATHETERIZATION  "several"  ? CARPAL TUNNEL RELEASE Bilateral   ? CATARACT EXTRACTION W/PHACO Right 06/15/2016  ? Procedure: CATARACT EXTRACTION PHACO AND INTRAOCULAR LENS PLACEMENT (IOC);  Surgeon: Rutherford Guys, MD;  [facility];  Service: Ophthalmology;  Laterality: Right;  CDE: 4.64  ? CATARACT EXTRACTION W/PHACO Left 07/13/2016  ? Procedure: CATARACT EXTRACTION PHACO AND INTRAOCULAR LENS PLACEMENT (IOC);  Surgeon:  Rutherford Guys, MD;  [facility];  Service: Ophthalmology;  Laterality: Left;  CDE: 3.15  ? COLONOSCOPY  12/28/2010  ? SLF: (MAC)Internal hemorrhoids/four small colon polyps tubular adenomas. per SLF: colonoscopy 2022  ? COLONOSCOPY WITH PROPOFOL N/A 07/05/2017  ? Surgeon: Danie Binder, MD; tubular adenoma, hyperplastic polyp, submucosal nodule at hepatic flexure consistent with lipoma on biopsy, diverticulosis and rectosigmoid and sigmoid colon, rectal bleeding due to large internal hemorrhoids.  Recommended repeat in 3 years due to history of polyps and oily film on scope.  ? CORONARY ARTERY BYPASS GRAFT  2002  ? 3 vessels  ? ESOPHAGOGASTRODUODENOSCOPY N/A 05/30/2012  ? SLF: UNCONTROLLED GERD DUE TO LIFESTYLE CHOICE/WEIGHT GAIN/MILD Non-erosive gastritis  ? ESOPHAGOGASTRODUODENOSCOPY (EGD) WITH PROPOFOL N/A 03/02/2016  ? Dr. Oneida Alar: Esophagus appeared normal, impaired dilation performed, patchy inflammation with edema and erythema of the entire stomach., Biopsy with H pylori, patient completed Pylera.   ? ESOPHAGOGASTRODUODENOSCOPY (EGD) WITH PROPOFOL N/A 12/04/2019  ? Surgeon: Eloise Harman, DO; benign appearing esophageal stenosis dilated, gastritis biopsied (mild reactive gastropathy, gastritis, negative H. pylori), normal duodenum.  ? FRACTURE SURGERY    ? LEFT HEART CATHETERIZATION WITH CORONARY ANGIOGRAM N/A 08/31/2011  ? Procedure: LEFT HEART CATHETERIZATION WITH CORONARY ANGIOGRAM;  Surgeon: Laverda Page, MD;  Location: [facility] C[facility] LAB;  Service: Cardiovascular;  Laterality: N/A;  ? LUMBAR [place]    ? "L4-5; Dr. Trenton Gammon"  ? LUMBAR LAMINECTOMY/DECOMPRESSION MICRODISCECTOMY Right 03/06/2019  ? Procedure: MICRODISCECTOMY EXTRAFORAMINAL LUMBAR THREE - LUMBAR FOUR RIGHT;  Surgeon: Earnie Larsson, MD;  Location: [place];  Service: Neurosurgery;  Laterality: Right;  MICRODISCECTOMY EXTRAFORAMINAL LUMBAR THREE - LUMBAR FOUR RIGHT  ? MULTIPLE TOOTH EXTRACTIONS    ? ORIF MANDIBULAR FRACTURE N/A 12/19/2015   ? Procedure: OPEN REDUCTION INTERNAL FIXATION (ORIF) MANDIBULAR FRACTURE;  Surgeon: Izora Gala, MD;  Location: [place];  Service: ENT;  Laterality: N/A;  ? [place]  ? plate to knee cap from accident  ? POLYPECTOMY  07/05/2017  ? Procedure: POLYPECTOMY;  Surgeon: Danie Binder, MD;  [facility];  Service: Endoscopy;;  colon  ? SAVORY DILATION  12/28/2010  ? SLF:(MAC)J-shaped stomach/nodular mocosa in the distal esophagus/empiric dilation 36m  ? SAVORY DILATION N/A 03/02/2016  ? Procedure: SAVORY DILATION;  Surgeon: SDanie Binder MD;  [facility];  Service: Endoscopy;  Laterality: N/A;  ? ? ?Current Medications: ?Current Meds  ?Medication Sig  ? ALPRAZolam (XANAX) 1 MG tablet Take 1 mg by mouth 3 (three) times daily.   ? amLODipine (NORVASC) 5 MG tablet TAKE 1 TABLET(5 MG) BY MOUTH DAILY  ? atorvastatin (LIPITOR) 80 MG tablet Take 80 mg by mouth at bedtime.  ? beclomethasone (QVAR) 80 MCG/ACT inhaler Inhale 2 puffs into the lungs 2 (two) times daily as needed (respiratory  issues.).   ? BELSOMRA 15 MG TABS Take 1 tablet by mouth at bedtime.  ? brimonidine (ALPHAGAN P) 0.1 % SOLN Place 1 drop into the right eye 2 (two) times daily.  ? cetirizine (ZYRTEC) 10 MG tablet Take 10 mg by mouth daily as needed for allergies.   ? clopidogrel (PLAVIX) 75 MG tablet Take 75 mg by mouth daily.  ? dexlansoprazole (DEXILANT) 60 MG capsule Take 1 capsule (60 mg total) by mouth daily.  ? ezetimibe (ZETIA) 10 MG tablet Take 10 mg by mouth daily.  ? fluticasone (FLONASE) 50 MCG/ACT nasal spray Place 2 sprays into both nostrils daily.  ? Fluticasone Furoate (ARNUITY ELLIPTA) 200 MCG/ACT AEPB Inhale 1 puff into the lungs daily as needed (shortness of breath).   ? furosemide (LASIX) 40 MG tablet TAKE 1 TABLET(40 MG) BY MOUTH DAILY  ? glipiZIDE (GLUCOTROL) 10 MG tablet Take 10 mg by mouth daily before breakfast.  ? hydrochlorothiazide (HYDRODIURIL) 25 MG tablet Take 25 mg by mouth daily.   ? ipratropium (ATROVENT) 0.02 % nebulizer solution Take 0.5 mg by nebulization 2 (two) times daily as needed for wheezing or shortness of breath (for congestion).   ? ketoconazole (NIZORAL) 2 % shampoo A

## 2021-05-18 ENCOUNTER — Telehealth (HOSPITAL_COMMUNITY): Payer: Self-pay | Admitting: *Deleted

## 2021-05-18 NOTE — Telephone Encounter (Signed)
Attempted to call patient regarding upcoming appointment- no answer, unable to leave a message.  Dalton [name]  

## 2021-05-19 ENCOUNTER — Telehealth (HOSPITAL_COMMUNITY): Payer: Self-pay | Admitting: *Deleted

## 2021-05-19 NOTE — Telephone Encounter (Signed)
Patient given detailed instructions per Myocardial Perfusion Study Information Sheet for the test on 05/25/21 Patient notified to arrive 15 minutes early and that it is imperative to arrive on time for appointment to keep from having the test rescheduled. ? If you need to cancel or reschedule your appointment, please call the office within 24 hours of your appointment. . Patient verbalized understanding. Kirstie Peri ? ? ? ?

## 2021-05-20 ENCOUNTER — Telehealth: Payer: Self-pay | Admitting: Physician Assistant

## 2021-05-20 NOTE — Telephone Encounter (Signed)
Patient said the monitor fell off this morning. He is not sure what to do. He has only worn it for 7 days  ?

## 2021-05-20 NOTE — Telephone Encounter (Signed)
Patient was instructed to mail ZIO XT monitor back to [facility] for processing.  [employer] considers any XT monitor that has recorded over 4 days of data complete. ?

## 2021-05-25 ENCOUNTER — Ambulatory Visit (HOSPITAL_COMMUNITY): Payer: Medicare Other | Attending: Cardiology

## 2021-05-25 VITALS — Ht 70.0 in | Wt 245.0 lb

## 2021-05-25 DIAGNOSIS — I25700 Atherosclerosis of coronary artery bypass graft(s), unspecified, with unstable angina pectoris: Secondary | ICD-10-CM | POA: Diagnosis present

## 2021-05-25 DIAGNOSIS — I10 Essential (primary) hypertension: Secondary | ICD-10-CM | POA: Diagnosis present

## 2021-05-25 DIAGNOSIS — R002 Palpitations: Secondary | ICD-10-CM | POA: Insufficient documentation

## 2021-05-25 DIAGNOSIS — R0609 Other forms of dyspnea: Secondary | ICD-10-CM | POA: Insufficient documentation

## 2021-05-25 DIAGNOSIS — E785 Hyperlipidemia, unspecified: Secondary | ICD-10-CM | POA: Diagnosis present

## 2021-05-25 MED ORDER — TECHNETIUM TC 99M TETROFOSMIN IV KIT
10.2000 | PACK | Freq: Once | INTRAVENOUS | Status: AC | PRN
  Administered 2021-05-25: 10.2 via INTRAVENOUS
  Filled 2021-05-25: qty 11

## 2021-05-25 MED ORDER — REGADENOSON 0.4 MG/5ML IV SOLN
0.4000 mg | Freq: Once | INTRAVENOUS | Status: AC
  Administered 2021-05-25: 0.4 mg via INTRAVENOUS

## 2021-05-25 MED ORDER — TECHNETIUM TC 99M TETROFOSMIN IV KIT
30.2000 | PACK | Freq: Once | INTRAVENOUS | Status: AC | PRN
  Administered 2021-05-25: 30.2 via INTRAVENOUS
  Filled 2021-05-25: qty 31

## 2021-05-26 LAB — MYOCARDIAL PERFUSION IMAGING
LV dias vol: 114 mL (ref 62–150)
LV sys vol: 50 mL
Nuc Stress EF: 56 %
Peak HR: 82 {beats}/min
Rest HR: 56 {beats}/min
Rest Nuclear Isotope Dose: 10.3 mCi
SDS: 0
SRS: 0
SSS: 0
ST Depression (mm): 0 mm
Stress Nuclear Isotope Dose: 30.2 mCi
TID: 0.96

## 2021-06-06 NOTE — Progress Notes (Deleted)
? ? ?Referring Provider: Nolene Ebbs, MD ?Primary Care Physician:  Nolene Ebbs, MD ?Primary GI Physician: Dr. Abbey Chatters ? ?No chief complaint on file. ? ? ?HPI:   ?KARDER GOODIN is a 61 y.o. male with a history of GERD, dysphagia with esophageal stenosis s/p dilation in October 2021, H. pylori s/p treatment with Pylera in 2018 and follow-up endoscopic biopsies negative, opioid-induced constipation, lower abdominal pain, and adenomatous colon polyps.  Last colonoscopy May 2019, was due for surveillance colonoscopy May 2022.  He is presenting today for follow-up. ? ?Previous trial anad failure of Amitiza, magnesium citrate, Linzess.   Movantik worked well initially but has needed Linzess addition and mag citrate occasionlly. ? ?He was last seen in our office 09/04/20.  He was taking 1/2 tablet of Movantik and Linzess 145 mcg as needed.  Stated he would take 2-3 Linzess at a time which resulted in 2-3 bowel movements on those days.  Usually, bowels moving every 2 to 3 days.  Infrequent use of magnesium citrate.  Associated lower abdominal discomfort and bloating that improved with bowel movements.  He is taking Dexilant daily for GERD with breakthrough symptoms 1-2 times a week.  No alarm symptoms.  Recommended increasing Movantik 25 mg daily, Linzess 145 mcg daily, continue Dexilant and use Tums or Pepcid as needed for breakthrough.  Requested 2-week progress report on constipation and we will plan to schedule colonoscopy at that time.  Recommended follow-up in 3 to 4 months. ? ?Patient called on 8/22 reporting issues with constipation.  Unfortunately, we were unable to get in touch with him again to verify his medications to make adjustments.  He canceled 2 follow-up appointments and no showed to another follow-up. ? ?He saw Dr. Delle Reining with digestive health specialist on 06/03/2021.  He reported lower abdominal swelling, sometimes painful, extending into his groin, and stated his privates drawl back up inside.   Reported taking Linzess daily with bowel movements daily.  Recommended abdominal x-ray to see if he needs a bowel purge.  Also recommended abdominal ultrasound to evaluate for ascites.  Stated based on those results, would likely need to schedule him for surveillance colonoscopy.  Recommended evaluation with urology in regards to testicles drawing back up inside.  Recommend follow-up in 4 months. ? ?No imaging on file. ? ?Today: ? ?Constipation:  ? ?GERD:  ? ? ?Past Medical History:  ?Diagnosis Date  ? Anemia   ? Anxiety   ? Asthma   ? Bipolar disorder ([place])   ? CHF (congestive heart failure) ([place])   ? Chronic back pain   ? Pain Clinic in [place]  ? Chronic bronchitis   ? Chronic lower back pain   ? Congestive heart failure (CHF) ([place])   ? COPD (chronic obstructive pulmonary disease) ([place])   ? per patient 03/05/19  ? Coronary artery disease   ? Coughing   ? Depression   ? DVT (deep venous thrombosis) ([place]) ~ 2005  ? LLE  ? Frequency of urination   ? GERD (gastroesophageal reflux disease)   ? 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  last seizure in 2011;unknown etiology-pt sts heriditary (12/24/2015)  ? Headache(784.0)   ? "a few times/week" (12/24/2015)  ? High cholesterol   ? HNP (herniated nucleus pulposus), cervical   ? Hypertension   ? Laceration of right hand 11/27/2010  ? Laceration of wrist 2007 BIL FOREARMS  ? MI (myocardial infarction) ([place])   ? 7, last one was in 2011 (12/24/2015)  ? Neuromuscular  disorder ([place])   ? legs/feet  ? Neuropathy   ? legs/feet  ? NSAID-induced gastric ulcer   ? "Ibuprofen"  ? PUD (peptic ulcer disease)   ? in 1990s, secondary to medication  ? Rheumatoid arthritis ([place])   ? "all over" (12/24/2015)  ? Shortness of breath   ? with exertion  ? Tonsillitis, chronic   ? Dr. Vicki Mallet in [place]  ? Type II diabetes mellitus ([place])   ? Wears glasses   ? ? ?Past Surgical History:  ?Procedure Laterality Date  ? ANTERIOR CERVICAL DECOMP/DISCECTOMY FUSION N/A 11/05/2016  ? Procedure: Anterior Cervical  Discectomy and Fusion - Cervical three-Cervical four;  Surgeon: Earnie Larsson, MD;  Location: [place];  Service: Neurosurgery;  Laterality: N/A;  ? BACK SURGERY    ? x3  ? BALLOON DILATION N/A 12/04/2019  ? Procedure: BALLOON DILATION;  Surgeon: Eloise Harman, DO;  [facility];  Service: Endoscopy;  Laterality: N/A;  ? BIOPSY N/A 05/30/2012  ? Procedure: BIOPSY;  Surgeon: Danie Binder, MD;  [facility];  Service: Endoscopy;  Laterality: N/A;  ? BIOPSY  03/02/2016  ? Procedure: BIOPSY;  Surgeon: Danie Binder, MD;  [facility];  Service: Endoscopy;;  gastric  ? BIOPSY  12/04/2019  ? Procedure: BIOPSY;  Surgeon: Eloise Harman, DO;  [facility];  Service: Endoscopy;;  ? CARDIAC CATHETERIZATION  "several"  ? CARPAL TUNNEL RELEASE Bilateral   ? CATARACT EXTRACTION W/PHACO Right 06/15/2016  ? Procedure: CATARACT EXTRACTION PHACO AND INTRAOCULAR LENS PLACEMENT (IOC);  Surgeon: Rutherford Guys, MD;  [facility];  Service: Ophthalmology;  Laterality: Right;  CDE: 4.64  ? CATARACT EXTRACTION W/PHACO Left 07/13/2016  ? Procedure: CATARACT EXTRACTION PHACO AND INTRAOCULAR LENS PLACEMENT (IOC);  Surgeon: Rutherford Guys, MD;  [facility];  Service: Ophthalmology;  Laterality: Left;  CDE: 3.15  ? COLONOSCOPY  12/28/2010  ? SLF: (MAC)Internal hemorrhoids/four small colon polyps tubular adenomas. per SLF: colonoscopy 2022  ? COLONOSCOPY WITH PROPOFOL N/A 07/05/2017  ? Surgeon: Danie Binder, MD; tubular adenoma, hyperplastic polyp, submucosal nodule at hepatic flexure consistent with lipoma on biopsy, diverticulosis and rectosigmoid and sigmoid colon, rectal bleeding due to large internal hemorrhoids.  Recommended repeat in 3 years due to history of polyps and oily film on scope.  ? CORONARY ARTERY BYPASS GRAFT  2002  ? 3 vessels  ? ESOPHAGOGASTRODUODENOSCOPY N/A 05/30/2012  ? SLF: UNCONTROLLED GERD DUE TO LIFESTYLE CHOICE/WEIGHT GAIN/MILD Non-erosive gastritis  ?  ESOPHAGOGASTRODUODENOSCOPY (EGD) WITH PROPOFOL N/A 03/02/2016  ? Dr. Oneida Alar: Esophagus appeared normal, impaired dilation performed, patchy inflammation with edema and erythema of the entire stomach., Biopsy with H pylori, patient completed Pylera.   ? ESOPHAGOGASTRODUODENOSCOPY (EGD) WITH PROPOFOL N/A 12/04/2019  ? Surgeon: Eloise Harman, DO; benign appearing esophageal stenosis dilated, gastritis biopsied (mild reactive gastropathy, gastritis, negative H. pylori), normal duodenum.  ? FRACTURE SURGERY    ? LEFT HEART CATHETERIZATION WITH CORONARY ANGIOGRAM N/A 08/31/2011  ? Procedure: LEFT HEART CATHETERIZATION WITH CORONARY ANGIOGRAM;  Surgeon: Laverda Page, MD;  Location: [facility] CATH LAB;  Service: Cardiovascular;  Laterality: N/A;  ? LUMBAR [place]    ? "L4-5; Dr. Trenton Gammon"  ? LUMBAR LAMINECTOMY/DECOMPRESSION MICRODISCECTOMY Right 03/06/2019  ? Procedure: MICRODISCECTOMY EXTRAFORAMINAL LUMBAR THREE - LUMBAR FOUR RIGHT;  Surgeon: Earnie Larsson, MD;  Location: [place];  Service: Neurosurgery;  Laterality: Right;  MICRODISCECTOMY EXTRAFORAMINAL LUMBAR THREE - LUMBAR FOUR RIGHT  ? MULTIPLE TOOTH EXTRACTIONS    ?  ORIF MANDIBULAR FRACTURE N/A 12/19/2015  ? Procedure: OPEN REDUCTION INTERNAL FIXATION (ORIF) MANDIBULAR FRACTURE;  Surgeon: Izora Gala, MD;  Location: [place];  Service: ENT;  Laterality: N/A;  ? [place]  ? plate to knee cap from accident  ? POLYPECTOMY  07/05/2017  ? Procedure: POLYPECTOMY;  Surgeon: Danie Binder, MD;  [facility];  Service: Endoscopy;;  colon  ? SAVORY DILATION  12/28/2010  ? SLF:(MAC)J-shaped stomach/nodular mocosa in the distal esophagus/empiric dilation 40m  ? SAVORY DILATION N/A 03/02/2016  ? Procedure: SAVORY DILATION;  Surgeon: SDanie Binder MD;  [facility];  Service: Endoscopy;  Laterality: N/A;  ? ? ?Current Outpatient Medications  ?Medication Sig Dispense Refill  ? albuterol (PROVENTIL HFA;VENTOLIN HFA) 108 (90 Base)  MCG/ACT inhaler Inhale 2 puffs into the lungs every 6 (six) hours as needed for wheezing or shortness of breath.     ? albuterol (PROVENTIL) (2.5 MG/3ML) 0.083% nebulizer solution Take 2.5 mg by nebulization every

## 2021-06-08 ENCOUNTER — Ambulatory Visit: Payer: Medicare Other | Admitting: Gastroenterology

## 2021-06-08 ENCOUNTER — Telehealth: Payer: Self-pay | Admitting: *Deleted

## 2021-06-08 DIAGNOSIS — I25700 Atherosclerosis of coronary artery bypass graft(s), unspecified, with unstable angina pectoris: Secondary | ICD-10-CM

## 2021-06-08 DIAGNOSIS — R0609 Other forms of dyspnea: Secondary | ICD-10-CM

## 2021-06-08 NOTE — Telephone Encounter (Signed)
Spoke with pt and reviewed recommendations per Dr. Tamala Julian.  Pt agreeable to plan.  ? ?Will route to Dr. Tamala Julian to sign attestation. ?

## 2021-06-08 NOTE — Telephone Encounter (Signed)
-----   Message from Belva Crome, MD sent at 06/08/2021  8:46 AM EDT ----- ?He needs to have a plain exercise treadmill test to assess functional tolerance.  He should be exercised on his current medical regimen.  This will give some idea as to conditioning and whether or not heart rate has difficulty increasing appropriately. ?----- Message ----- ?From: Loren Racer, RN ?Sent: 06/04/2021   9:25 AM EDT ?To: Belva Crome, MD ? ?Spoke with pt and reviewed results.  Pt is not having issues with the Metoprolol but states he is still having issues with SOB and feeling very fatigued at times.  This was occurring prior to his visit with Vin or the medication increase.  Pt has not been able to check his HR or BP.  States he is looking to obtain a new cuff.  Advised I will update Dr. Tamala Julian and call back with his recommendations once I hear from him.  ? ?

## 2021-06-09 ENCOUNTER — Other Ambulatory Visit: Payer: Self-pay | Admitting: Physician Assistant

## 2021-06-14 NOTE — Progress Notes (Signed)
?Cardiology Office Note:   ? ?Date:  06/15/2021  ? ?ID:  Dalton [name], DOB 12/17/1960, MRN [id number] ? ?PCP:  Nolene Ebbs, MD  ?[facility] Cardiologist:  Sinclair Grooms, MD  ?[facility] Electrophysiologist:  None  ? ?Chief Complaint: 4 weeks follow up  ? ?History of Present Illness:   ? ?XION DEBRUYNE is a 61 y.o. male with a hx of AWAD GLADD is a 61 y.o. male with a hx of CAD with prior single-vessel CABG 2002 (LIMA to LAD) with last cardiac catheterization 08/2011 showing atretic LIMA with normal native coronary arteries, multiple prior MIs perhaps in the setting of cocaine use, ischemic cardiomyopathy (last EF60-65%) ), HTN, HLD, type 2 diabetes mellitus, left subclavian stent, rheumatoid arthritis, DVT, peptic ulcer disease, bipolar disorder, and past cocaine use presents for follow up.  ?  ?Echo 09/2019: LVEF 60-65% and grade 1 DD ?Coronary CT 09/2019 with calcium score of 0. No evidence of CAD.  ? ?Seen by me 05/14/2021 for chest pain, shortness of breath and palpitation.  Symptoms different than her prior anginal pain.  Follow-up Lexiscan Myoview without ischemia or infection.  Monitor mostly with sinus rhythm and nonsustained symptomatic SVT. Reviewed by Dr. Tamala Julian who recommended ETT on current medication to assess functional tolerance. ? ?Patient here for follow-up.  He continues to have exertional shortness of breath with occasional chest tightness.  He need to stop to catch his breath.  He works on [employer].  Denies dizziness, lower extremity edema or melena.  Reports using low-sodium diet. ? ? ?Past Medical History:  ?Diagnosis Date  ? Anemia   ? Anxiety   ? Asthma   ? Bipolar disorder ([place])   ? CHF (congestive heart failure) ([place])   ? Chronic back pain   ? Pain Clinic in [place]  ? Chronic bronchitis   ? Chronic lower back pain   ? Congestive heart failure (CHF) ([place])   ? COPD (chronic obstructive pulmonary disease) ([place])   ? per patient 03/05/19  ? Coronary artery disease   ?  Coughing   ? Depression   ? DVT (deep venous thrombosis) ([place]) ~ 2005  ? LLE  ? Frequency of urination   ? GERD (gastroesophageal reflux disease)   ? 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  last seizure in 2011;unknown etiology-pt sts heriditary (12/24/2015)  ? Headache(784.0)   ? "a few times/week" (12/24/2015)  ? High cholesterol   ? HNP (herniated nucleus pulposus), cervical   ? Hypertension   ? Laceration of right hand 11/27/2010  ? Laceration of wrist 2007 BIL FOREARMS  ? MI (myocardial infarction) ([place])   ? 7, last one was in 2011 (12/24/2015)  ? Neuromuscular disorder ([place])   ? legs/feet  ? Neuropathy   ? legs/feet  ? NSAID-induced gastric ulcer   ? "Ibuprofen"  ? PUD (peptic ulcer disease)   ? in 1990s, secondary to medication  ? Rheumatoid arthritis ([place])   ? "all over" (12/24/2015)  ? Shortness of breath   ? with exertion  ? Tonsillitis, chronic   ? Dr. Vicki Mallet in [place]  ? Type II diabetes mellitus ([place])   ? Wears glasses   ? ? ?Past Surgical History:  ?Procedure Laterality Date  ? ANTERIOR CERVICAL DECOMP/DISCECTOMY FUSION N/A 11/05/2016  ? Procedure: Anterior Cervical Discectomy and Fusion - Cervical three-Cervical four;  Surgeon: Earnie Larsson, MD;  Location: [place];  Service: Neurosurgery;  Laterality: N/A;  ? BACK SURGERY    ?  x3  ? BALLOON DILATION N/A 12/04/2019  ? Procedure: BALLOON DILATION;  Surgeon: Eloise Harman, DO;  [facility];  Service: Endoscopy;  Laterality: N/A;  ? BIOPSY N/A 05/30/2012  ? Procedure: BIOPSY;  Surgeon: Danie Binder, MD;  [facility];  Service: Endoscopy;  Laterality: N/A;  ? BIOPSY  03/02/2016  ? Procedure: BIOPSY;  Surgeon: Danie Binder, MD;  [facility];  Service: Endoscopy;;  gastric  ? BIOPSY  12/04/2019  ? Procedure: BIOPSY;  Surgeon: Eloise Harman, DO;  [facility];  Service: Endoscopy;;  ? CARDIAC CATHETERIZATION  "several"  ? CARPAL TUNNEL RELEASE Bilateral   ? CATARACT EXTRACTION W/PHACO Right 06/15/2016  ? Procedure:  CATARACT EXTRACTION PHACO AND INTRAOCULAR LENS PLACEMENT (IOC);  Surgeon: Rutherford Guys, MD;  [facility];  Service: Ophthalmology;  Laterality: Right;  CDE: 4.64  ? CATARACT EXTRACTION W/PHACO Left 07/13/2016  ? Procedure: CATARACT EXTRACTION PHACO AND INTRAOCULAR LENS PLACEMENT (IOC);  Surgeon: Rutherford Guys, MD;  [facility];  Service: Ophthalmology;  Laterality: Left;  CDE: 3.15  ? COLONOSCOPY  12/28/2010  ? SLF: (MAC)Internal hemorrhoids/four small colon polyps tubular adenomas. per SLF: colonoscopy 2022  ? COLONOSCOPY WITH PROPOFOL N/A 07/05/2017  ? Surgeon: Danie Binder, MD; tubular adenoma, hyperplastic polyp, submucosal nodule at hepatic flexure consistent with lipoma on biopsy, diverticulosis and rectosigmoid and sigmoid colon, rectal bleeding due to large internal hemorrhoids.  Recommended repeat in 3 years due to history of polyps and oily film on scope.  ? CORONARY ARTERY BYPASS GRAFT  2002  ? 3 vessels  ? ESOPHAGOGASTRODUODENOSCOPY N/A 05/30/2012  ? SLF: UNCONTROLLED GERD DUE TO LIFESTYLE CHOICE/WEIGHT GAIN/MILD Non-erosive gastritis  ? ESOPHAGOGASTRODUODENOSCOPY (EGD) WITH PROPOFOL N/A 03/02/2016  ? Dr. Oneida Alar: Esophagus appeared normal, impaired dilation performed, patchy inflammation with edema and erythema of the entire stomach., Biopsy with H pylori, patient completed Pylera.   ? ESOPHAGOGASTRODUODENOSCOPY (EGD) WITH PROPOFOL N/A 12/04/2019  ? Surgeon: Eloise Harman, DO; benign appearing esophageal stenosis dilated, gastritis biopsied (mild reactive gastropathy, gastritis, negative H. pylori), normal duodenum.  ? FRACTURE SURGERY    ? LEFT HEART CATHETERIZATION WITH CORONARY ANGIOGRAM N/A 08/31/2011  ? Procedure: LEFT HEART CATHETERIZATION WITH CORONARY ANGIOGRAM;  Surgeon: Laverda Page, MD;  Location: [facility] C[facility];  Service: Cardiovascular;  Laterality: N/A;  ? LUMBAR [place]    ? "L4-5; Dr. Trenton Gammon"  ? LUMBAR LAMINECTOMY/DECOMPRESSION MICRODISCECTOMY Right 03/06/2019  ?  Procedure: MICRODISCECTOMY EXTRAFORAMINAL LUMBAR THREE - LUMBAR FOUR RIGHT;  Surgeon: Earnie Larsson, MD;  Location: [place];  Service: Neurosurgery;  Laterality: Right;  MICRODISCECTOMY EXTRAFORAMINAL LUMBAR THREE - LUMBAR FOUR RIGHT  ? MULTIPLE TOOTH EXTRACTIONS    ? ORIF MANDIBULAR FRACTURE N/A 12/19/2015  ? Procedure: OPEN REDUCTION INTERNAL FIXATION (ORIF) MANDIBULAR FRACTURE;  Surgeon: Izora Gala, MD;  Location: [place];  Service: ENT;  Laterality: N/A;  ? [place]  ? plate to knee cap from accident  ? POLYPECTOMY  07/05/2017  ? Procedure: POLYPECTOMY;  Surgeon: Danie Binder, MD;  [facility];  Service: Endoscopy;;  colon  ? SAVORY DILATION  12/28/2010  ? SLF:(MAC)J-shaped stomach/nodular mocosa in the distal esophagus/empiric dilation 52m  ? SAVORY DILATION N/A 03/02/2016  ? Procedure: SAVORY DILATION;  Surgeon: SDanie Binder MD;  [facility];  Service: Endoscopy;  Laterality: N/A;  ? ? ?Current Medications: ?Current Meds  ?Medication Sig  ? albuterol (PROVENTIL HFA;VENTOLIN HFA) 108 (90 Base)  MCG/ACT inhaler Inhale 2 puffs into the lungs every 6 (six) hours as needed for wheezing or shortness of breath.   ? albuterol (PROVENTIL) (2.5 MG/3ML) 0.083% nebulizer solution Take 2.5 mg by nebulization every 6 (six) hours as needed for wheezing or shortness of breath.   ? ALPRAZolam (XANAX) 1 MG tablet Take 1 mg by mouth 3 (three) times daily.   ? amLODipine (NORVASC) 5 MG tablet TAKE 1 TABLET(5 MG) BY MOUTH DAILY  ? atorvastatin (LIPITOR) 80 MG tablet Take 80 mg by mouth at bedtime.  ? beclomethasone (QVAR) 80 MCG/ACT inhaler Inhale 2 puffs into the lungs 2 (two) times daily as needed (respiratory issues.).   ? BELSOMRA 15 MG TABS Take 1 tablet by mouth at bedtime.  ? brimonidine (ALPHAGAN P) 0.1 % SOLN Place 1 drop into the right eye 2 (two) times daily.  ? cetirizine (ZYRTEC) 10 MG tablet Take 10 mg by mouth daily as needed for allergies.   ? clopidogrel (PLAVIX)  75 MG tablet Take 75 mg by mouth daily.  ? dexlansoprazole (DEXILANT) 60 MG capsule Take 1 capsule (60 mg total) by mouth daily.  ? ezetimibe (ZETIA) 10 MG tablet Take 10 mg by mouth daily.  ? fluticason

## 2021-06-15 ENCOUNTER — Ambulatory Visit (INDEPENDENT_AMBULATORY_CARE_PROVIDER_SITE_OTHER): Payer: Medicare Other | Admitting: Physician Assistant

## 2021-06-15 ENCOUNTER — Encounter: Payer: Self-pay | Admitting: Physician Assistant

## 2021-06-15 VITALS — BP 130/96 | HR 72 | Ht 70.0 in | Wt 271.0 lb

## 2021-06-15 DIAGNOSIS — R0609 Other forms of dyspnea: Secondary | ICD-10-CM

## 2021-06-15 DIAGNOSIS — I25700 Atherosclerosis of coronary artery bypass graft(s), unspecified, with unstable angina pectoris: Secondary | ICD-10-CM

## 2021-06-15 DIAGNOSIS — I10 Essential (primary) hypertension: Secondary | ICD-10-CM

## 2021-06-15 DIAGNOSIS — E785 Hyperlipidemia, unspecified: Secondary | ICD-10-CM | POA: Diagnosis not present

## 2021-06-15 NOTE — Patient Instructions (Signed)
Medication Instructions:  ?Your physician recommends that you continue on your current medications as directed. Please refer to the Current Medication list given to you today.  ?*If you need a refill on your cardiac medications before your next appointment, please call your pharmacy* ? ? ?Lab Work: ?None ?If you have labs (blood work) drawn today and your tests are completely normal, you will receive your results only by: ?MyChart Message (if you have MyChart) OR ?A paper copy in the mail ?If you have any lab test that is abnormal or we need to change your treatment, we will call you to review the results. ? ? ? ?Follow-Up: ?At [facility], you and your health needs are our priority.  As part of our continuing mission to provide you with exceptional heart care, we have created designated Provider Care Teams.  These Care Teams include your primary Cardiologist (physician) and Advanced Practice Providers (APPs -  Physician Assistants and Nurse Practitioners) who all work together to provide you with the care you need, when you need it. ? ?We recommend signing up for the patient portal called "MyChart".  Sign up information is provided on this After Visit Summary.  MyChart is used to connect with patients for Virtual Visits (Telemedicine).  Patients are able to view lab/test results, encounter notes, upcoming appointments, etc.  Non-urgent messages can be sent to your provider as well.   ?To learn more about what you can do with MyChart, go to [email or web address].   ? ?Your next appointment:   ?2-3 week(s) ? ?The format for your next appointment:   ?In Person ? ?Provider:   ?Sinclair Grooms, MD   ? ? ?Important Information About Sugar ? ? ? ? ?  ?

## 2021-06-25 ENCOUNTER — Ambulatory Visit: Payer: Medicare Other

## 2021-06-25 ENCOUNTER — Telehealth: Payer: Self-pay | Admitting: Radiology

## 2021-06-25 NOTE — Telephone Encounter (Signed)
Patient came in for a GXT today. After calling patient from waiting room. I discovered patient walked with a cane and and was SOB walking to my office. Upon speaking to patient I found out he also was told to hold his Lopressor. But according to office note... GXT was recommended on current medication to assess functional tolerance. I spoke to DOD and we both decided to cancel appt at this time and send msg to ordering doctor to decided if test should be rescheduled. Patient is willing to try to walk but has been unable to do it in the past. I could switch to a modified bruce but not sure it would make much a difference.  ?

## 2021-06-25 NOTE — Telephone Encounter (Signed)
I would not recommend ETT if he walks with a cane. He had nuclear study < 2 months ago. I am confused about why it was ordered.  ?

## 2021-06-26 NOTE — Telephone Encounter (Signed)
This is the man we were discussing. Looks like treadmill was ordered 4/24 after increase of metoprolol and patient mentioning being SOB.    ?

## 2021-06-29 ENCOUNTER — Ambulatory Visit: Payer: Medicare Other | Admitting: Interventional Cardiology

## 2021-08-25 ENCOUNTER — Emergency Department (HOSPITAL_COMMUNITY): Payer: Medicare Other

## 2021-08-25 ENCOUNTER — Encounter (HOSPITAL_COMMUNITY): Payer: Self-pay | Admitting: Emergency Medicine

## 2021-08-25 ENCOUNTER — Other Ambulatory Visit: Payer: Self-pay

## 2021-08-25 ENCOUNTER — Observation Stay (HOSPITAL_COMMUNITY)
Admission: EM | Admit: 2021-08-25 | Discharge: 2021-08-26 | Disposition: A | Discharge status: Home / Self Care | Payer: Medicare Other | Attending: Family Medicine | Admitting: Family Medicine

## 2021-08-25 DIAGNOSIS — R0789 Other chest pain: Secondary | ICD-10-CM | POA: Diagnosis not present

## 2021-08-25 DIAGNOSIS — Z7984 Long term (current) use of oral hypoglycemic drugs: Secondary | ICD-10-CM | POA: Insufficient documentation

## 2021-08-25 DIAGNOSIS — Z7902 Long term (current) use of antithrombotics/antiplatelets: Secondary | ICD-10-CM | POA: Diagnosis not present

## 2021-08-25 DIAGNOSIS — R9431 Abnormal electrocardiogram [ECG] [EKG]: Secondary | ICD-10-CM

## 2021-08-25 DIAGNOSIS — E785 Hyperlipidemia, unspecified: Secondary | ICD-10-CM

## 2021-08-25 DIAGNOSIS — I509 Heart failure, unspecified: Secondary | ICD-10-CM | POA: Insufficient documentation

## 2021-08-25 DIAGNOSIS — E119 Type 2 diabetes mellitus without complications: Secondary | ICD-10-CM

## 2021-08-25 DIAGNOSIS — K219 Gastro-esophageal reflux disease without esophagitis: Secondary | ICD-10-CM

## 2021-08-25 DIAGNOSIS — J449 Chronic obstructive pulmonary disease, unspecified: Secondary | ICD-10-CM | POA: Diagnosis not present

## 2021-08-25 DIAGNOSIS — I110 Hypertensive heart disease with heart failure: Secondary | ICD-10-CM | POA: Insufficient documentation

## 2021-08-25 DIAGNOSIS — J45909 Unspecified asthma, uncomplicated: Secondary | ICD-10-CM | POA: Diagnosis not present

## 2021-08-25 DIAGNOSIS — R079 Chest pain, unspecified: Secondary | ICD-10-CM

## 2021-08-25 DIAGNOSIS — R072 Precordial pain: Secondary | ICD-10-CM

## 2021-08-25 DIAGNOSIS — Z86718 Personal history of other venous thrombosis and embolism: Secondary | ICD-10-CM | POA: Insufficient documentation

## 2021-08-25 DIAGNOSIS — Z79899 Other long term (current) drug therapy: Secondary | ICD-10-CM | POA: Diagnosis not present

## 2021-08-25 DIAGNOSIS — I2510 Atherosclerotic heart disease of native coronary artery without angina pectoris: Secondary | ICD-10-CM | POA: Diagnosis not present

## 2021-08-25 DIAGNOSIS — I10 Essential (primary) hypertension: Secondary | ICD-10-CM

## 2021-08-25 DIAGNOSIS — E876 Hypokalemia: Secondary | ICD-10-CM

## 2021-08-25 LAB — BASIC METABOLIC PANEL
Anion gap: 9 (ref 5–15)
BUN: 14 mg/dL (ref 6–20)
CO2: 29 mmol/L (ref 22–32)
Calcium: 9 mg/dL (ref 8.9–10.3)
Chloride: 100 mmol/L (ref 98–111)
Creatinine, Ser: 1.04 mg/dL (ref 0.61–1.24)
GFR, Estimated: >60 mL/min (ref >60)
Glucose, Bld: 103 mg/dL — ABNORMAL HIGH (ref 70–99)
Potassium: 2.5 mmol/L — CL (ref 3.5–5.1)
Sodium: 138 mmol/L (ref 135–145)

## 2021-08-25 LAB — TROPONIN I (HIGH SENSITIVITY): Troponin I (High Sensitivity): 3 ng/L (ref <18)

## 2021-08-25 LAB — MAGNESIUM: Magnesium: 1.7 mg/dL (ref 1.7–2.4)

## 2021-08-25 LAB — CBC
HCT: 35.8 % — ABNORMAL LOW (ref 39.0–52.0)
Hemoglobin: 12.6 g/dL — ABNORMAL LOW (ref 13.0–17.0)
MCH: 30.5 pg (ref 26.0–34.0)
MCHC: 35.2 g/dL (ref 30.0–36.0)
MCV: 86.7 fL (ref 80.0–100.0)
Platelets: 201 10*3/uL (ref 150–400)
RBC: 4.13 MIL/uL — ABNORMAL LOW (ref 4.22–5.81)
RDW: 13 % (ref 11.5–15.5)
WBC: 6 10*3/uL (ref 4.0–10.5)
nRBC: 0 % (ref 0.0–0.2)

## 2021-08-25 LAB — BRAIN NATRIURETIC PEPTIDE: B Natriuretic Peptide: 60 pg/mL (ref 0.0–100.0)

## 2021-08-25 MED ORDER — ACETAMINOPHEN 325 MG PO TABS: 650.0000 mg | ORAL_TABLET | Freq: Four times a day (QID) | ORAL | Status: DC | PRN

## 2021-08-25 MED ORDER — MORPHINE SULFATE (PF) 4 MG/ML IV SOLN
4.0000 mg | Freq: Once | INTRAVENOUS | Status: AC
  Administered 2021-08-25: 4 mg via INTRAVENOUS
  Filled 2021-08-25: qty 1

## 2021-08-25 MED ORDER — ENOXAPARIN SODIUM 40 MG/0.4ML IJ SOSY
40.0000 mg | PREFILLED_SYRINGE | INTRAMUSCULAR | Status: DC
  Administered 2021-08-26: 40 mg via SUBCUTANEOUS
  Filled 2021-08-25: qty 0.4

## 2021-08-25 MED ORDER — ACETAMINOPHEN 650 MG RE SUPP: 650.0000 mg | Freq: Four times a day (QID) | RECTAL | Status: DC | PRN

## 2021-08-25 MED ORDER — ONDANSETRON HCL 4 MG/2ML IJ SOLN
4.0000 mg | Freq: Once | INTRAMUSCULAR | Status: AC
  Administered 2021-08-25: 4 mg via INTRAVENOUS
  Filled 2021-08-25: qty 2

## 2021-08-25 MED ORDER — ALUM & MAG HYDROXIDE-SIMETH 200-200-20 MG/5ML PO SUSP
30.0000 mL | Freq: Once | ORAL | Status: AC
  Administered 2021-08-25: 30 mL via ORAL
  Filled 2021-08-25: qty 30

## 2021-08-25 MED ORDER — POTASSIUM CHLORIDE 20 MEQ PO PACK
40.0000 meq | PACK | Freq: Once | ORAL | Status: AC
Start: 2021-08-25 — End: 2021-08-25
  Administered 2021-08-25: 40 meq via ORAL
  Filled 2021-08-25: qty 2

## 2021-08-25 MED ORDER — POTASSIUM CHLORIDE 10 MEQ/100ML IV SOLN
10.0000 meq | INTRAVENOUS | Status: AC
  Administered 2021-08-25 – 2021-08-26 (×3): 10 meq via INTRAVENOUS
  Filled 2021-08-25 (×3): qty 100

## 2021-08-25 MED ORDER — FAMOTIDINE 20 MG PO TABS
20.0000 mg | ORAL_TABLET | Freq: Once | ORAL | Status: AC
  Administered 2021-08-25: 20 mg via ORAL
  Filled 2021-08-25: qty 1

## 2021-08-25 NOTE — H&P (Signed)
History and Physical    Patient: Dalton [name]:[id number] DOB: 1960/12/23 DOA: 08/25/2021 DOS: the patient was seen and examined on 08/26/2021 PCP: Nolene Ebbs, MD  Patient coming from: Home  Chief Complaint:  Chief Complaint  Patient presents with   Chest Pain   HPI: Dalton [name] is a 61 y.o. male with medical history significant of COPD, anxiety, hypertension, hyperlipidemia, GERD, CAD, T2DM who presents to the emergency department due to 4 to 5-day onset of intermittent chest pain.  States that he has about 3 polyps removed from his prostate on Thursday (6/29), on returning home, he was complaining of intermittent mid sternal chest pain with radiation to left arm, chest pain was nonreproducible.  He was not sure if chest pain was due to indigestion, heartburn or cardiac chest pain.  Chest pain was associated with nausea and shortness of breath but no, fever, chills, sweating, abdominal pain or cough.  ED Course:  In the emergency department, patient was hemodynamically stable.  Work-up in the showed normocytic anemia and normal BMP except for hypokalemia and blood glucose level of 103, magnesium 1.7, BNP 60 Chest x-ray showed no active cardiopulmonary disease Patient was treated with morphine, Zofran was given, Pepcid was also given.  Potassium was replenished and Maalox was given. Hospitalist was asked to admit patient for further evaluation and management.  Review of Systems: Review of systems as noted in the HPI. All other systems reviewed and are negative.   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[place])     last seizure in 2011;unknown etiology-pt sts heriditary (12/24/2015)   IZTIWPYK(998.3)    "a few times/week" (12/24/2015)   High cholesterol    HNP (herniated nucleus pulposus), cervical    Hypertension    Laceration of right hand 11/27/2010   Laceration of wrist 2007 BIL FOREARMS   MI (myocardial infarction) ([place])    7, last one was in 2011 (12/24/2015)   Neuromuscular disorder ([place])    legs/feet   Neuropathy    legs/feet   NSAID-induced gastric ulcer    "Ibuprofen"   PUD (peptic ulcer disease)    in 1990s, secondary to medication   Rheumatoid arthritis ([place])    "all over" (12/24/2015)   Shortness of breath    with exertion   Tonsillitis, chronic    Dr. Vicki Mallet in [place]   Type II diabetes mellitus ([place])    Wears glasses    Past Surgical History:  Procedure Laterality Date   ANTERIOR CERVICAL DECOMP/DISCECTOMY FUSION N/A 11/05/2016   Procedure: Anterior Cervical Discectomy and Fusion - Cervical three-Cervical four;  Surgeon: Earnie Larsson, MD;  Location: [facility];  Service: Neurosurgery;  Laterality: N/A;   BACK SURGERY     x3   BALLOON DILATION N/A 12/04/2019   Procedure: BALLOON DILATION;  Surgeon: Hurshel Keys  K, DO;  [facility];  Service: Endoscopy;  Laterality: N/A;   BIOPSY N/A 05/30/2012   Procedure: BIOPSY;  Surgeon: Danie Binder, MD;  [facility];  Service: Endoscopy;  Laterality: N/A;   BIOPSY  03/02/2016   Procedure: BIOPSY;  Surgeon: Danie Binder, MD;  [facility];  Service: Endoscopy;;  gastric   BIOPSY  12/04/2019   Procedure: BIOPSY;  Surgeon: Eloise Harman, DO;  [facility];  Service: Endoscopy;;   CARDIAC CATHETERIZATION  "several"   CARPAL TUNNEL RELEASE Bilateral    CATARACT EXTRACTION W/PHACO Right 06/15/2016   Procedure: CATARACT EXTRACTION PHACO AND  INTRAOCULAR LENS PLACEMENT ([place]);  Surgeon: Rutherford Guys, MD;  [facility];  Service: Ophthalmology;  Laterality: Right;  CDE: 4.64   CATARACT EXTRACTION W/PHACO Left 07/13/2016   Procedure: CATARACT EXTRACTION PHACO AND INTRAOCULAR LENS PLACEMENT ([facility]);  Surgeon: Rutherford Guys, MD;  [facility];  Service: Ophthalmology;  Laterality: Left;  CDE: 3.15   COLONOSCOPY  12/28/2010   SLF: (MAC)Internal hemorrhoids/four small colon polyps tubular adenomas. per SLF: colonoscopy 2022   COLONOSCOPY WITH PROPOFOL N/A 07/05/2017   Surgeon: Danie Binder, MD; tubular adenoma, hyperplastic polyp, submucosal nodule at hepatic flexure consistent with lipoma on biopsy, diverticulosis and rectosigmoid and sigmoid colon, rectal bleeding due to large internal hemorrhoids.  Recommended repeat in 3 years due to history of polyps and oily film on scope.   CORONARY ARTERY BYPASS GRAFT  2002   3 vessels   ESOPHAGOGASTRODUODENOSCOPY N/A 05/30/2012   SLF: UNCONTROLLED GERD DUE TO LIFESTYLE CHOICE/WEIGHT GAIN/MILD Non-erosive gastritis   ESOPHAGOGASTRODUODENOSCOPY (EGD) WITH PROPOFOL N/A 03/02/2016   Dr. Oneida Alar: Esophagus appeared normal, impaired dilation performed, patchy inflammation with edema and erythema of the entire stomach., Biopsy with H pylori, patient completed Pyl[place].    ESOPHAGOGASTRODUODENOSCOPY (EGD) WITH PROPOFOL N/A 12/04/2019   Surgeon: Eloise Harman, DO; benign appearing esophageal stenosis dilated, gastritis biopsied (mild reactive gastropathy, gastritis, negative H. pylori), normal duodenum.   FRACTURE SURGERY     LEFT HEART CATHETERIZATION WITH CORONARY ANGIOGRAM N/A 08/31/2011   Procedure: LEFT HEART CATHETERIZATION WITH CORONARY ANGIOGRAM;  Surgeon: Laverda Page, MD;  Location: [facility] C[facility];  Service: Cardiovascular;  Laterality: N/A;   LUMBAR DISC SURGERY     "L4-5; Dr. Trenton Gammon"   LUMBAR LAMINECTOMY/DECOMPRESSION MICRODISCECTOMY Right 03/06/2019   Procedure: MICRODISCECTOMY  EXTRAFORAMINAL LUMBAR THREE - LUMBAR FOUR RIGHT;  Surgeon: Earnie Larsson, MD;  Location: [facility];  Service: Neurosurgery;  Laterality: Right;  MICRODISCECTOMY EXTRAFORAMINAL LUMBAR THREE - LUMBAR FOUR RIGHT   MULTIPLE TOOTH EXTRACTIONS     ORIF MANDIBULAR FRACTURE N/A 12/19/2015   Procedure: OPEN REDUCTION INTERNAL FIXATION (ORIF) MANDIBULAR FRACTURE;  Surgeon: Izora Gala, MD;  Location: [facility];  Service: ENT;  Laterality: N/A;   PATELLA FRACTURE SURGERY Left 1976   plate to knee cap from accident   POLYPECTOMY  07/05/2017   Procedure: POLYPECTOMY;  Surgeon: Danie Binder, MD;  [facility];  Service: Endoscopy;;  colon   SAVORY DILATION  12/28/2010   SLF:(MAC)J-shaped stomach/nodular mocosa in the distal esophagus/empiric dilation 76m   SAVORY DILATION N/A 03/02/2016   Procedure: SAVORY DILATION;  Surgeon: SDanie Binder MD;  [facility];  Service: Endoscopy;  Laterality: N/A;    Social History:  reports that he has never smoked. He has never used smokeless tobacco. He reports that he does not currently use drugs after having used the following drugs: "Crack" cocaine,  Cocaine, and Marijuana. He reports that he does not drink alcohol.   Allergies  Allergen Reactions   Gabapentin Hives   Ibuprofen Other (See Comments)    Stomach  Upset and stomach ulcers   Zolpidem Tartrate Other (See Comments)    Hallucinations    Naproxen Other (See Comments)    HALLUCINATIONS    Family History  Problem Relation Age of Onset   Diabetes Mother    Hypertension Mother    Heart attack Mother 75   Hypertension Father    Diabetes Father    Heart attack Father 46   Heart attack Other        mother, father, brother, sister all deceased due to MI   Heart attack Sister    Heart attack Brother    Seizures Brother    Heart failure Other    Colon cancer Neg Hx    Liver disease Neg Hx    Anesthesia problems Neg Hx    Hypotension Neg Hx    Malignant hyperthermia Neg Hx     Pseudochol deficiency Neg Hx    Colon polyps Neg Hx      Prior to Admission medications   Medication Sig Start Date End Date Taking? Authorizing Provider  albuterol (PROVENTIL HFA;VENTOLIN HFA) 108 (90 Base) MCG/ACT inhaler Inhale 2 puffs into the lungs every 6 (six) hours as needed for wheezing or shortness of breath.     [provider]  albuterol (PROVENTIL) (2.5 MG/3ML) 0.083% nebulizer solution Take 2.5 mg by nebulization every 6 (six) hours as needed for wheezing or shortness of breath.     [provider]  ALPRAZolam Duanne Moron) 1 MG tablet Take 1 mg by mouth 3 (three) times daily.  08/01/19   [provider]  amLODipine (NORVASC) 5 MG tablet TAKE 1 TABLET(5 MG) BY MOUTH DAILY 04/07/20   Arnoldo Lenis, MD  atorvastatin (LIPITOR) 80 MG tablet Take 80 mg by mouth at bedtime. 12/05/20   [provider]  beclomethasone (QVAR) 80 MCG/ACT inhaler Inhale 2 puffs into the lungs 2 (two) times daily as needed (respiratory issues.).     [provider]  BELSOMRA 15 MG TABS Take 1 tablet by mouth at bedtime. 04/30/21   [provider]  brimonidine (ALPHAGAN P) 0.1 % SOLN Place 1 drop into the right eye 2 (two) times daily. 12/25/20   [provider]  cetirizine (ZYRTEC) 10 MG tablet Take 10 mg by mouth daily as needed for allergies.  01/26/16   [provider]  clopidogrel (PLAVIX) 75 MG tablet Take 75 mg by mouth daily.    [provider]  dexlansoprazole (DEXILANT) 60 MG capsule Take 1 capsule (60 mg total) by mouth daily. 07/05/17   Fields, Marga Melnick, MD  ezetimibe (ZETIA) 10 MG tablet Take 10 mg by mouth daily.    [provider]  fluticasone (FLONASE) 50 MCG/ACT nasal spray Place 2 sprays into both nostrils daily.    [provider]  Fluticasone Furoate (ARNUITY ELLIPTA) 200 MCG/ACT AEPB Inhale 1 puff into the lungs daily as needed (shortness of breath).     [provider]  furosemide (LASIX) 40 MG  tablet TAKE 1 TABLET(40 MG) BY MOUTH DAILY 03/11/21   Belva Crome, MD  glipiZIDE (GLUCOTROL) 10 MG tablet Take 10 mg by mouth daily before breakfast.    [provider]  hydrochlorothiazide (HYDRODIURIL) 25 MG tablet Take 25 mg by mouth daily.    [provider]  ipratropium (ATROVENT)  0.02 % nebulizer solution Take 0.5 mg by nebulization 2 (two) times daily as needed for wheezing or shortness of breath (for congestion).     [provider]  ketoconazole (NIZORAL) 2 % shampoo Apply 1 application topically daily as needed for irritation.  10/23/19   [provider]  latanoprost (XALATAN) 0.005 % ophthalmic solution 1 drop at bedtime. 12/25/20   [provider]  LINZESS 290 MCG CAPS capsule TAKE 1 CAPSULE(290 MCG) BY MOUTH DAILY BEFORE BREAKFAST 02/04/21   Mahala Menghini, PA-C  lisinopril (PRINIVIL,ZESTRIL) 10 MG tablet Take 10 mg by mouth daily.    [provider]  lisinopril-hydrochlorothiazide (ZESTORETIC) 10-12.5 MG tablet Take 1 tablet by mouth daily. 03/11/21   [provider]  Liver Extract (LIVER PO) Take 1 tablet by mouth daily. "Liver Aid"    [provider]  magnesium citrate SOLN Take 1 Bottle by mouth as needed for moderate constipation.     [provider]  metFORMIN (GLUCOPHAGE-XR) 500 MG 24 hr tablet Take 500 mg by mouth daily with breakfast.     [provider]  methocarbamol (ROBAXIN) 500 MG tablet Take 500 mg by mouth every 6 (six) hours as needed. 11/13/20   [provider]  metoprolol tartrate (LOPRESSOR) 50 MG tablet TAKE 1 TABLET(50 MG) BY MOUTH TWICE DAILY 06/09/21   Belva Crome, MD  Multiple Vitamin (MULTIVITAMIN WITH MINERALS) TABS tablet Take 1 tablet by mouth daily.    [provider]  naloxegol oxalate (MOVANTIK) 25 MG TABS tablet Take 1 tablet (25 mg total) by mouth daily. Patient not taking: Reported on 06/15/2021 08/21/19   Carlis Stable, NP  [facility] 4 MG/0.1ML LIQD  nasal spray kit Place 1 spray into the nose as needed (opioid overdose).  06/15/17   [provider]  nitroGLYCERIN (NITROSTAT) 0.4 MG SL tablet SEE NOTES Patient taking differently: Place 0.4 mg under the tongue every 5 (five) minutes as needed for chest pain. 07/07/18   Arnoldo Lenis, MD  oxyCODONE-acetaminophen (PERCOCET) 10-325 MG tablet Take 1 tablet by mouth every 4 (four) hours as needed for pain.    [provider]  potassium chloride SA (KLOR-CON) 20 MEQ tablet Take 20 mEq by mouth 2 (two) times daily.    [provider]  predniSONE (DELTASONE) 10 MG tablet Take 40 mg by mouth daily. 04/24/21   [provider]  REPATHA SURECLICK 438 MG/ML SOAJ ADMINISTER 1 ML UNDER THE SKIN EVERY 14 DAYS 03/31/21   Arnoldo Lenis, MD  sertraline (ZOLOFT) 100 MG tablet Take 100 mg by mouth daily.    [provider]  sildenafil (VIAGRA) 100 MG tablet Take 100 mg by mouth daily as needed. 01/03/21   [provider]  sitaGLIPtin (JANUVIA) 100 MG tablet Take 100 mg by mouth daily.    [provider]  sucralfate (CARAFATE) 1 GM/10ML suspension in the morning, at noon, in the evening, and at bedtime. 09/10/20   [provider]  tamsulosin (FLOMAX) 0.4 MG CAPS capsule Take 1 capsule (0.4 mg total) by mouth daily after breakfast. 01/07/16   Kerrie Buffalo, NP  Vitamin D, Ergocalciferol, (DRISDOL) 1.25 MG (50000 UT) CAPS capsule Take 50,000 Units by mouth every 7 (seven) days. Monday    [provider]    Physical Exam: BP 132/87 (BP Location: Left Arm)   Pulse (!) 59   Temp 98.7 F (37.1 C)   Resp 20   Ht _0  (1.778 m)   Wt  127.3 kg   SpO2 100%   BMI 40.28 kg/m   General: 61 y.o. year-old male well developed well nourished in no acute distress.  Alert and oriented x3. HEENT: NCAT, EOMI Neck: Supple, trachea medial Cardiovascular: Regular rate and rhythm with no rubs or gallops.  No thyromegaly or JVD noted.  No lower  extremity edema. 2/4 pulses in all 4 extremities. Respiratory: Clear to auscultation with no wheezes or rales. Good inspiratory effort. Abdomen: Soft, nontender nondistended with normal bowel sounds x4 quadrants. Muskuloskeletal: No cyanosis, clubbing or edema noted bilaterally Neuro: CN II-XII intact, strength 5/5 x 4, sensation, reflexes intact Skin: No ulcerative lesions noted or rashes Psychiatry: Judgement and insight appear normal. Mood is appropriate for condition and setting          Labs on Admission:  Basic Metabolic Panel: Recent Labs  Lab 08/25/21 2144  NA 138  K 2.5*  CL 100  CO2 29  GLUCOSE 103*  BUN 14  CREATININE 1.04  CALCIUM 9.0  MG 1.7   Liver Function Tests: No results for input(s): "AST", "ALT", "ALKPHOS", "BILITOT", "PROT", "ALBUMIN" in the last 168 hours. No results for input(s): "LIPASE", "AMYLASE" in the last 168 hours. No results for input(s): "AMMONIA" in the last 168 hours. CBC: Recent Labs  Lab 08/25/21 2144  WBC 6.0  HGB 12.6*  HCT 35.8*  MCV 86.7  PLT 201   Cardiac Enzymes: No results for input(s): "CKTOTAL", "CKMB", "CKMBINDEX", "TROPONINI" in the last 168 hours.  BNP (last 3 results) Recent Labs    08/25/21 2144  BNP 60.0    ProBNP (last 3 results) No results for input(s): "PROBNP" in the last 8760 hours.  CBG: No results for input(s): "GLUCAP" in the last 168 hours.  Radiological Exams on Admission: DG Chest 2 View  Result Date: 08/25/2021 CLINICAL DATA:  Chest pain. EXAM: CHEST - 2 VIEW COMPARISON:  01/28/2016 FINDINGS: The heart size and mediastinal contours are within normal limits. A vascular stent is noted in the superior mediastinum on the left. Lung volumes are low. No consolidation, effusion, or pneumothorax. Sternotomy wires are present over the midline. Sternotomy wires are noted over the midline. Mild degenerative changes are present in the thoracic spine. IMPRESSION: No active cardiopulmonary disease.  Electronically Signed   By: Brett Fairy M.D.   On: 08/25/2021 22:11    EKG: I independently viewed the EKG done and my findings are as followed: Normal sinus rhythm at rate of 85 bpm with prolonged QTc 549 ms   Assessment/Plan Present on Admission:  Chest pain  Essential hypertension  Gastroesophageal reflux disease without esophagitis  Hyperlipidemia LDL goal <70  Coronary artery disease involving native coronary artery of native heart without angina pectoris  Principal Problem:   Chest pain Active Problems:   Gastroesophageal reflux disease without esophagitis   Coronary artery disease involving native coronary artery of native heart without angina pectoris   Essential hypertension   Hyperlipidemia LDL goal <70   Diabetes mellitus without complication (HCC)   Prolonged QT interval   Hypokalemia  Chest Pain Cardiovascular risk factors include hypertension, hyperlipidemia, T2DM, CAD Continue telemetry  Troponins x2 was flat at 3 EKG showed normal sinus rhythm at rate of 85 bpm with prolonged QTc of 549 ms Consider cardiology consult to help decide if Stress test is needed in am Versus other  diagnostic modalities.    Give aspirin, nitroglycerin prn  Hypokalemia K+ is 2.5 K+ will be replenished Please monitor for AM K+ for further  replenishmemnt  Prolonged QT interval QTc 549 ms Avoid QT prolonging drugs Magnesium level will be checked Repeat EKG in the morning  COPD Continue albuterol and Atrovent  Essential hypertension Continue amlodipine  Mixed hyperlipidemia Continue Lipitor and Zetia  CAD Continue Plavix and Lipitor  GERD Continue Protonix  T2DM  Continue ISS and hypoglycemic protocol  DVT prophylaxis: Lovenox  Code Status: Full code  Consults: None  Family Communication: None at bedside  Severity of Illness: The appropriate patient status for this patient is OBSERVATION. Observation status is judged to be reasonable and necessary in order  to provide the required intensity of service to ensure the patient's safety. The patient's presenting symptoms, physical exam findings, and initial radiographic and laboratory data in the context of their medical condition is felt to place them at decreased risk for further clinical deterioration. Furthermore, it is anticipated that the patient will be medically stable for discharge from the hospital within 2 midnights of admission.   Author: Bernadette Hoit, DO 08/26/2021 1:24 AM  For on call review [email or web address].

## 2021-08-25 NOTE — ED Provider Notes (Signed)
[facility] EMERGENCY DEPARTMENT Provider Note   CSN: [id number] Arrival date & time: 08/25/21  2124     History  Chief Complaint  Patient presents with   Chest Pain    ASAHD CAN is a 61 y.o. male.  Patient with hx cad, cabg 2002, c/o mid chest pain intermittently in the past 2-3 days. Symptoms acute onset, lasting several minutes to an hour, dull, non radiating, not pleuritic, not constant. States was unsure if heartburn/indigestion or similar to prior cardiac chest pain. W pain, mild sob, +nausea, no vomiting, no diaphoresis. Denies abd pain. No fever or chills. No cough or uri symptoms. No leg pain or swelling.   The history is provided by the patient, medical records and a relative.  Chest Pain Associated symptoms: nausea and shortness of breath   Associated symptoms: no abdominal pain, no back pain, no cough, no fever, no headache, no palpitations and no vomiting        Home Medications Prior to Admission medications   Medication Sig Start Date End Date Taking? Authorizing Provider  albuterol (PROVENTIL HFA;VENTOLIN HFA) 108 (90 Base) MCG/ACT inhaler Inhale 2 puffs into the lungs every 6 (six) hours as needed for wheezing or shortness of breath.     [provider]  albuterol (PROVENTIL) (2.5 MG/3ML) 0.083% nebulizer solution Take 2.5 mg by nebulization every 6 (six) hours as needed for wheezing or shortness of breath.     [provider]  ALPRAZolam Duanne Moron) 1 MG tablet Take 1 mg by mouth 3 (three) times daily.  08/01/19   [provider]  amLODipine (NORVASC) 5 MG tablet TAKE 1 TABLET(5 MG) BY MOUTH DAILY 04/07/20   Arnoldo Lenis, MD  atorvastatin (LIPITOR) 80 MG tablet Take 80 mg by mouth at bedtime. 12/05/20   [provider]  beclomethasone (QVAR) 80 MCG/ACT inhaler Inhale 2 puffs into the lungs 2 (two) times daily as needed (respiratory issues.).     [provider]  BELSOMRA 15 MG TABS Take 1 tablet by mouth at  bedtime. 04/30/21   [provider]  brimonidine (ALPHAGAN P) 0.1 % SOLN Place 1 drop into the right eye 2 (two) times daily. 12/25/20   [provider]  cetirizine (ZYRTEC) 10 MG tablet Take 10 mg by mouth daily as needed for allergies.  01/26/16   [provider]  clopidogrel (PLAVIX) 75 MG tablet Take 75 mg by mouth daily.    [provider]  dexlansoprazole (DEXILANT) 60 MG capsule Take 1 capsule (60 mg total) by mouth daily. 07/05/17   Fields, Marga Melnick, MD  ezetimibe (ZETIA) 10 MG tablet Take 10 mg by mouth daily.    [provider]  fluticasone (FLONASE) 50 MCG/ACT nasal spray Place 2 sprays into both nostrils daily.    [provider]  Fluticasone Furoate (ARNUITY ELLIPTA) 200 MCG/ACT AEPB Inhale 1 puff into the lungs daily as needed (shortness of breath).     [provider]  furosemide (LASIX) 40 MG tablet TAKE 1 TABLET(40 MG) BY MOUTH DAILY 03/11/21   Belva Crome, MD  glipiZIDE (GLUCOTROL) 10 MG tablet Take 10 mg by mouth daily before breakfast.    [provider]  hydrochlorothiazide (HYDRODIURIL) 25 MG tablet Take 25 mg by mouth daily.    [provider]  ipratropium (ATROVENT) 0.02 % nebulizer solution Take 0.5 mg by nebulization 2 (two) times daily as needed for wheezing or shortness of breath (for congestion).     [provider]  ketoconazole (NIZORAL) 2 % shampoo Apply 1 application topically daily as needed for irritation.  10/23/19   [provider]  latanoprost (XALATAN) 0.005 % ophthalmic solution 1 drop at bedtime. 12/25/20   [provider]  LINZESS 290 MCG CAPS capsule TAKE 1 CAPSULE(290 MCG) BY MOUTH DAILY BEFORE BREAKFAST 02/04/21   Mahala Menghini, PA-C  lisinopril (PRINIVIL,ZESTRIL) 10 MG tablet Take 10 mg by mouth daily.    [provider]  lisinopril-hydrochlorothiazide (ZESTORETIC) 10-12.5 MG tablet Take 1 tablet by mouth daily. 03/11/21   [provider]  Liver Extract (LIVER PO) Take 1 tablet by mouth daily. "Liver Aid"    [provider]  magnesium citrate SOLN Take 1 Bottle by mouth as needed for moderate constipation.     [provider]  metFORMIN (GLUCOPHAGE-XR) 500 MG 24 hr tablet Take 500 mg by mouth daily with breakfast.     [provider]  methocarbamol (ROBAXIN) 500 MG tablet Take 500 mg by mouth every 6 (six) hours as needed. 11/13/20   [provider]  metoprolol tartrate (LOPRESSOR) 50 MG tablet TAKE 1 TABLET(50 MG) BY MOUTH TWICE DAILY 06/09/21   Belva Crome, MD  Multiple Vitamin (MULTIVITAMIN WITH MINERALS) TABS tablet Take 1 tablet by mouth daily.    [provider]  naloxegol oxalate (MOVANTIK) 25 MG TABS tablet Take 1 tablet (25 mg total) by mouth daily. Patient not taking: Reported on 06/15/2021 08/21/19   Carlis Stable, NP  [facility] 4 MG/0.1ML LIQD nasal spray kit Place 1 spray into the nose as needed (opioid overdose).  06/15/17   [provider]  nitroGLYCERIN (NITROSTAT) 0.4 MG SL tablet SEE NOTES Patient taking differently: Place 0.4 mg under the tongue every 5 (five) minutes as needed for chest pain. 07/07/18   Arnoldo Lenis, MD  oxyCODONE-acetaminophen (PERCOCET) 10-325 MG tablet Take 1 tablet by mouth every 4 (four) hours as needed for pain.    [provider]  potassium chloride SA (KLOR-CON) 20 MEQ tablet Take 20 mEq by mouth 2 (two) times daily.    [provider]  predniSONE (DELTASONE) 10 MG tablet Take 40 mg by mouth daily. 04/24/21   [provider]  REPATHA SURECLICK 376 MG/ML SOAJ ADMINISTER 1 ML UNDER THE SKIN EVERY 14 DAYS 03/31/21   Arnoldo Lenis, MD  sertraline (ZOLOFT) 100 MG tablet Take 100 mg by mouth daily.    [provider]  sildenafil (VIAGRA) 100 MG tablet Take 100 mg by mouth daily as needed. 01/03/21   [provider]  sitaGLIPtin (JANUVIA) 100 MG tablet Take 100 mg by mouth daily.    [provider]  sucralfate (CARAFATE) 1 GM/10ML suspension in the morning, at noon, in the evening, and at bedtime. 09/10/20   [provider]  tamsulosin (FLOMAX) 0.4 MG CAPS capsule Take 1 capsule (0.4 mg total) by mouth daily after breakfast. 01/07/16   Kerrie Buffalo, NP  Vitamin D, Ergocalciferol, (DRISDOL) 1.25 MG (50000 UT) CAPS capsule Take 50,000 Units by mouth every 7 (seven) days. Monday    [provider]      Allergies    Gabapentin, Ibuprofen, Zolpidem tartrate, and Naproxen    Review of Systems   Review of Systems  Constitutional:  Negative for fever.  HENT:  Negative for sore throat.   Eyes:  Negative for redness.  Respiratory:  Positive for shortness of breath. Negative for cough.   Cardiovascular:  Positive for chest pain.  Negative for palpitations and leg swelling.  Gastrointestinal:  Positive for nausea. Negative for abdominal pain and vomiting.       Had 1-2 loose stools today  Genitourinary:  Negative for dysuria and flank pain.  Musculoskeletal:  Negative for back pain and neck pain.  Skin:  Negative for rash.  Neurological:  Negative for headaches.  Hematological:  Does not bruise/bleed easily.  Psychiatric/Behavioral:  Negative for confusion.     Physical Exam Updated Vital Signs BP 124/69 (BP Location: Left Arm)   Pulse 86   Temp 98.1 F (36.7 C) (Oral)   Resp 20   SpO2 93%  Physical Exam Vitals and nursing note reviewed.  Constitutional:      Appearance: Normal appearance. He is well-developed.  HENT:     Head: Atraumatic.     Nose: Nose normal.     Mouth/Throat:     Mouth: Mucous membranes are moist.  Eyes:     General: No scleral icterus.    Conjunctiva/sclera: Conjunctivae normal.  Neck:     Trachea: No tracheal deviation.  Cardiovascular:     Rate and Rhythm: Normal rate and regular rhythm.     Pulses: Normal pulses.     Heart sounds: Normal heart sounds. No murmur heard.    No friction rub. No gallop.  Pulmonary:      Effort: Pulmonary effort is normal. No accessory muscle usage or respiratory distress.     Breath sounds: Normal breath sounds.  Abdominal:     General: Bowel sounds are normal. There is no distension.     Palpations: Abdomen is soft. There is no mass.     Tenderness: There is no abdominal tenderness.  Genitourinary:    Comments: No cva tenderness. Musculoskeletal:     Cervical back: Normal range of motion and neck supple. No rigidity.     Comments: Mild symmetric foot/ankle/lower leg edema. No calf pain or asymmetric leg swelling.   Skin:    General: Skin is warm and dry.     Findings: No rash.  Neurological:     Mental Status: He is alert.     Comments: Alert, speech clear.   Psychiatric:        Mood and Affect: Mood normal.     ED Results / Procedures / Treatments   Labs (all labs ordered are listed, but only abnormal results are displayed) Results for orders placed or performed during the hospital encounter of 55/97/41  Basic metabolic panel  Result Value Ref Range   Sodium 138 135 - 145 mmol/L   Potassium 2.5 (LL) 3.5 - 5.1 mmol/L   Chloride 100 98 - 111 mmol/L   CO2 29 22 - 32 mmol/L   Glucose, Bld 103 (H) 70 - 99 mg/dL   BUN 14 6 - 20 mg/dL   Creatinine, Ser 1.04 0.61 - 1.24 mg/dL   Calcium 9.0 8.9 - 10.3 mg/dL   GFR, Estimated >60 >60 mL/min   Anion gap 9 5 - 15  CBC  Result Value Ref Range   WBC 6.0 4.0 - 10.5 K/uL   RBC 4.13 (L) 4.22 - 5.81 MIL/uL   Hemoglobin 12.6 (L) 13.0 - 17.0 g/dL   HCT 35.8 (L) 39.0 - 52.0 %   MCV 86.7 80.0 - 100.0 fL   MCH 30.5 26.0 - 34.0 pg   MCHC 35.2 30.0 - 36.0 g/dL   RDW 13.0 11.5 - 15.5 %   Platelets 201 150 - 400 K/uL   nRBC 0.0 0.0 -  0.2 %  Brain natriuretic peptide  Result Value Ref Range   B Natriuretic Peptide 60.0 0.0 - 100.0 pg/mL  Magnesium  Result Value Ref Range   Magnesium 1.7 1.7 - 2.4 mg/dL  Troponin I (High Sensitivity)  Result Value Ref Range   Troponin I (High Sensitivity) 3 <18 ng/L       EKG EKG Interpretation  Date/Time:  Tuesday August 25 2021 21:34:25 EDT Ventricular Rate:  85 PR Interval:  109 QRS Duration: 98 QT Interval:  461 QTC Calculation: 549 R Axis:   66 Text Interpretation: Sinus rhythm Atrial premature complexes Short PR interval Prolonged QT interval Nonspecific T wave abnormality Confirmed by Lajean Saver [phone]) on 08/25/2021 9:42:11 PM  Radiology DG Chest 2 View  Result Date: 08/25/2021 CLINICAL DATA:  Chest pain. EXAM: CHEST - 2 VIEW COMPARISON:  01/28/2016 FINDINGS: The heart size and mediastinal contours are within normal limits. A vascular stent is noted in the superior mediastinum on the left. Lung volumes are low. No consolidation, effusion, or pneumothorax. Sternotomy wires are present over the midline. Sternotomy wires are noted over the midline. Mild degenerative changes are present in the thoracic spine. IMPRESSION: No active cardiopulmonary disease. Electronically Signed   By: Brett Fairy M.D.   On: 08/25/2021 22:11    Procedures Procedures    Medications Ordered in ED Medications  morphine (PF) 4 MG/ML injection 4 mg (has no administration in time range)  ondansetron (ZOFRAN) injection 4 mg (has no administration in time range)  famotidine (PEPCID) tablet 20 mg (has no administration in time range)  alum & mag hydroxide-simeth (MAALOX/MYLANTA) 200-200-20 MG/5ML suspension 30 mL (has no administration in time range)    ED Course/ Medical Decision Making/ A&P                           Medical Decision Making Problems Addressed: Essential hypertension: chronic illness or injury that poses a threat to life or bodily functions Hypokalemia: acute illness or injury with systemic symptoms that poses a threat to life or bodily functions Precordial chest pain: acute illness or injury with systemic symptoms that poses a threat to life or bodily functions  Amount and/or Complexity of Data Reviewed Independent Historian:     Details:  family, hx External Data Reviewed: notes. Labs: ordered. Decision-making details documented in ED Course. Radiology: ordered and independent interpretation performed. Decision-making details documented in ED Course. ECG/medicine tests: ordered and independent interpretation performed. Decision-making details documented in ED Course. Discussion of management or test interpretation with external provider(s): Hospitalist, discussed pt.   Risk OTC drugs. Prescription drug management. Decision regarding hospitalization.   Iv ns. Continuous pulse ox and cardiac monitoring. Labs ordered/sent. Imaging ordered.   Reviewed nursing notes and prior charts for additional history. External reports reviewed. Additional history from: family.   Cardiac monitor: sinus rhythm, rate 80.  Labs reviewed/interpreted by me - wbc normal. Initial trop normal.   Xrays reviewed/interpreted by me - no pna.   Morphine, zofran, pepcid and maalox for symptom relief.   Recheck pt, no chest pain.   Additional labs reviewed/interpreted by me - k very low. Monitor/ecg. Kcl iv and po. Mg added to labs - Mg normal.   Will consult hospitalists for admission re marked hypokalemia, chest pain, repeat trop, tx low k, etc.            Final Clinical Impression(s) / ED Diagnoses Final diagnoses:  None    Rx / DC  Orders ED Discharge Orders     None         Lajean Saver, MD 08/25/21 2255

## 2021-08-25 NOTE — ED Triage Notes (Signed)
Pt c/o chest pain x 2 days intermittent.  Pt points to mid and left chest pt c/o intermittent symptoms of nausea/dizziness/sob and sweating. NAD at this time. Non diaphoretic. No sob/resp disress noted at this time

## 2021-08-26 DIAGNOSIS — R0789 Other chest pain: Secondary | ICD-10-CM | POA: Diagnosis not present

## 2021-08-26 DIAGNOSIS — I10 Essential (primary) hypertension: Secondary | ICD-10-CM | POA: Diagnosis not present

## 2021-08-26 DIAGNOSIS — K219 Gastro-esophageal reflux disease without esophagitis: Secondary | ICD-10-CM | POA: Diagnosis not present

## 2021-08-26 DIAGNOSIS — R9431 Abnormal electrocardiogram [ECG] [EKG]: Secondary | ICD-10-CM

## 2021-08-26 DIAGNOSIS — R079 Chest pain, unspecified: Secondary | ICD-10-CM | POA: Diagnosis not present

## 2021-08-26 DIAGNOSIS — E119 Type 2 diabetes mellitus without complications: Secondary | ICD-10-CM | POA: Diagnosis not present

## 2021-08-26 DIAGNOSIS — E876 Hypokalemia: Secondary | ICD-10-CM

## 2021-08-26 LAB — MAGNESIUM: Magnesium: 1.7 mg/dL (ref 1.7–2.4)

## 2021-08-26 LAB — CBC
HCT: 38.7 % — ABNORMAL LOW (ref 39.0–52.0)
Hemoglobin: 12.8 g/dL — ABNORMAL LOW (ref 13.0–17.0)
MCH: 30 pg (ref 26.0–34.0)
MCHC: 33.1 g/dL (ref 30.0–36.0)
MCV: 90.8 fL (ref 80.0–100.0)
Platelets: 212 10*3/uL (ref 150–400)
RBC: 4.26 MIL/uL (ref 4.22–5.81)
RDW: 13.1 % (ref 11.5–15.5)
WBC: 6.6 10*3/uL (ref 4.0–10.5)
nRBC: 0 % (ref 0.0–0.2)

## 2021-08-26 LAB — GLUCOSE, CAPILLARY: Glucose-Capillary: 151 mg/dL — ABNORMAL HIGH (ref 70–99)

## 2021-08-26 LAB — COMPREHENSIVE METABOLIC PANEL
ALT: 24 U/L (ref 0–44)
AST: 24 U/L (ref 15–41)
Albumin: 4 g/dL (ref 3.5–5.0)
Alkaline Phosphatase: 63 U/L (ref 38–126)
Anion gap: 10 (ref 5–15)
BUN: 11 mg/dL (ref 6–20)
CO2: 27 mmol/L (ref 22–32)
Calcium: 8.9 mg/dL (ref 8.9–10.3)
Chloride: 102 mmol/L (ref 98–111)
Creatinine, Ser: 0.86 mg/dL (ref 0.61–1.24)
GFR, Estimated: >60 mL/min (ref >60)
Glucose, Bld: 159 mg/dL — ABNORMAL HIGH (ref 70–99)
Potassium: 3.5 mmol/L (ref 3.5–5.1)
Sodium: 139 mmol/L (ref 135–145)
Total Bilirubin: 0.7 mg/dL (ref 0.3–1.2)
Total Protein: 7.1 g/dL (ref 6.5–8.1)

## 2021-08-26 LAB — HIV ANTIBODY (ROUTINE TESTING W REFLEX): HIV Screen 4th Generation wRfx: NONREACTIVE

## 2021-08-26 LAB — HEMOGLOBIN A1C
Hgb A1c MFr Bld: 5.9 % — ABNORMAL HIGH (ref 4.8–5.6)
Mean Plasma Glucose: 122.63 mg/dL

## 2021-08-26 LAB — RAPID URINE DRUG SCREEN, HOSP PERFORMED
Amphetamines: NOT DETECTED
Barbiturates: NOT DETECTED
Benzodiazepines: NOT DETECTED
Cocaine: NOT DETECTED
Opiates: POSITIVE — AB
Tetrahydrocannabinol: NOT DETECTED

## 2021-08-26 LAB — TROPONIN I (HIGH SENSITIVITY): Troponin I (High Sensitivity): 3 ng/L (ref <18)

## 2021-08-26 LAB — PHOSPHORUS: Phosphorus: 3.1 mg/dL (ref 2.5–4.6)

## 2021-08-26 MED ORDER — CLOPIDOGREL BISULFATE 75 MG PO TABS
75.0000 mg | ORAL_TABLET | Freq: Every day | ORAL | Status: DC
  Administered 2021-08-26: 75 mg via ORAL
  Filled 2021-08-26: qty 1

## 2021-08-26 MED ORDER — PANTOPRAZOLE SODIUM 40 MG PO TBEC
40.0000 mg | DELAYED_RELEASE_TABLET | Freq: Every day | ORAL | Status: DC
  Administered 2021-08-26: 40 mg via ORAL
  Filled 2021-08-26: qty 1

## 2021-08-26 MED ORDER — IPRATROPIUM BROMIDE 0.02 % IN SOLN: 0.5000 mg | Freq: Two times a day (BID) | RESPIRATORY_TRACT | Status: DC | PRN

## 2021-08-26 MED ORDER — NITROGLYCERIN 0.4 MG SL SUBL: 0.4000 mg | SUBLINGUAL_TABLET | SUBLINGUAL | Status: AC | PRN

## 2021-08-26 MED ORDER — ATORVASTATIN CALCIUM 40 MG PO TABS
80.0000 mg | ORAL_TABLET | Freq: Every day | ORAL | Status: DC
  Administered 2021-08-26: 80 mg via ORAL
  Filled 2021-08-26: qty 2

## 2021-08-26 MED ORDER — INSULIN ASPART 100 UNIT/ML IJ SOLN
0.0000 [IU] | Freq: Three times a day (TID) | INTRAMUSCULAR | Status: DC
  Administered 2021-08-26: 3 [IU] via SUBCUTANEOUS

## 2021-08-26 MED ORDER — EZETIMIBE 10 MG PO TABS
10.0000 mg | ORAL_TABLET | Freq: Every day | ORAL | Status: DC
  Administered 2021-08-26: 10 mg via ORAL
  Filled 2021-08-26: qty 1

## 2021-08-26 MED ORDER — NITROGLYCERIN 0.4 MG SL SUBL: 0.4000 mg | SUBLINGUAL_TABLET | SUBLINGUAL | Status: DC | PRN

## 2021-08-26 MED ORDER — ALBUTEROL SULFATE HFA 108 (90 BASE) MCG/ACT IN AERS
2.0000 | INHALATION_SPRAY | Freq: Four times a day (QID) | RESPIRATORY_TRACT | Status: DC | PRN
Start: 2021-08-26 — End: 2021-08-26

## 2021-08-26 MED ORDER — ALBUTEROL SULFATE (2.5 MG/3ML) 0.083% IN NEBU
2.5000 mg | INHALATION_SOLUTION | Freq: Four times a day (QID) | RESPIRATORY_TRACT | Status: DC | PRN
Start: 2021-08-26 — End: 2021-08-26

## 2021-08-26 MED ORDER — POTASSIUM CHLORIDE 10 MEQ/100ML IV SOLN
10.0000 meq | INTRAVENOUS | Status: AC
  Administered 2021-08-26 (×2): 10 meq via INTRAVENOUS

## 2021-08-26 MED ORDER — AMLODIPINE BESYLATE 5 MG PO TABS
5.0000 mg | ORAL_TABLET | Freq: Every day | ORAL | Status: DC
  Administered 2021-08-26: 5 mg via ORAL
  Filled 2021-08-26: qty 1

## 2021-08-26 NOTE — Discharge Instructions (Signed)
IMPORTANT INFORMATION: PAY CLOSE ATTENTION   PHYSICIAN DISCHARGE INSTRUCTIONS  Follow with Primary care provider  Nolene Ebbs, MD  and other consultants as instructed by your Hospitalist Physician  [place] IF SYMPTOMS COME BACK, WORSEN OR NEW PROBLEM DEVELOPS   Please note: You were cared for by a hospitalist during your hospital stay. Every effort will be made to forward records to your primary care provider.  You can request that your primary care provider send for your hospital records if they have not received them.  Once you are discharged, your primary care physician will handle any further medical issues. Please note that NO REFILLS for any discharge medications will be authorized once you are discharged, as it is imperative that you return to your primary care physician (or establish a relationship with a primary care physician if you do not have one) for your post hospital discharge needs so that they can reassess your need for medications and monitor your lab values.  Please get a complete blood count and chemistry panel checked by your Primary MD at your next visit, and again as instructed by your Primary MD.  Get Medicines reviewed and adjusted: Please take all your medications with you for your next visit with your Primary MD  Laboratory/radiological data: Please request your Primary MD to go over all hospital tests and procedure/radiological results at the follow up, please ask your primary care provider to get all Hospital records sent to his/her office.  In some cases, they will be blood work, cultures and biopsy results pending at the time of your discharge. Please request that your primary care provider follow up on these results.  If you are diabetic, please bring your blood sugar readings with you to your follow up appointment with primary care.    Please call and make your follow up appointments as soon as possible.    Also Note  the following: If you experience worsening of your admission symptoms, develop shortness of breath, life threatening emergency, suicidal or homicidal thoughts you must seek medical attention immediately by calling 911 or calling your MD immediately  if symptoms less severe.  You must read complete instructions/literature along with all the possible adverse reactions/side effects for all the Medicines you take and that have been prescribed to you. Take any new Medicines after you have completely understood and accpet all the possible adverse reactions/side effects.   Do not drive when taking Pain medications or sleeping medications (Benzodiazepines)  Do not take more than prescribed Pain, Sleep and Anxiety Medications. It is not advisable to combine anxiety,sleep and pain medications without talking with your primary care practitioner  Special Instructions: If you have smoked or chewed Tobacco  in the last 2 yrs please stop smoking, stop any regular Alcohol  and or any Recreational drug use.  Wear Seat belts while driving.  Do not drive if taking any narcotic, mind altering or controlled substances or recreational drugs or alcohol.

## 2021-08-26 NOTE — Discharge Summary (Signed)
Physician Discharge Summary  RAYMIR FROMMELT BHA:[id number] DOB: 03-05-1960 DOA: 08/25/2021  PCP: Nolene Ebbs, MD Cardiologist: Dr. Tamala Julian   Admit date: 08/25/2021 Discharge date: 08/26/2021  Admitted From:  Home  Disposition: Home   Recommendations for Outpatient Follow-up:  Follow up with PCP in 1 weeks Follow up with cardiologist   Discharge Condition: STABLE   CODE STATUS: FULL DIET: heart healthy recommended    Brief Hospitalization Summary: Please see all hospital notes, images, labs for full details of the hospitalization. 61 y.o. male with medical history significant of COPD, anxiety, hypertension, hyperlipidemia, GERD, CAD, T2DM who presents to the emergency department due to 4 to 5-day onset of intermittent chest pain.  States that he has about 3 polyps removed from his prostate on Thursday (6/29), on returning home, he was complaining of intermittent mid sternal chest pain with radiation to left arm, chest pain was nonreproducible.  He was not sure if chest pain was due to indigestion, heartburn or cardiac chest pain.  Chest pain was associated with nausea and shortness of breath but no, fever, chills, sweating, abdominal pain or cough.   ED Course:  In the emergency department, patient was hemodynamically stable.  Work-up in the showed normocytic anemia and normal BMP except for hypokalemia and blood glucose level of 103, magnesium 1.7, BNP 60.   Chest x-ray showed no active cardiopulmonary disease.  Patient was treated with morphine, Zofran was given, Pepcid was also given.  Potassium was replenished and Maalox was given. Hospitalist was asked to admit patient for further evaluation and management.  Hospital Course  Pt was admitted for observation on telemetry and had serial HS troponins that were reassuring at 3.  He was treated with protonix.  He had a nuclear stress test a few months ago with his cardiologist back in April 2023 that was reassuring.  We don't believe that his  symptoms are cardiac.  His telemetry monitoring has been reassuring. No acute ischemic changes.  He is stable to discharge home and have outpatient follow up.  He has a cardiologist to follow up with and a primary care provider.    Discharge Diagnoses:  Principal Problem:   Chest pain Active Problems:   Gastroesophageal reflux disease without esophagitis   Coronary artery disease involving native coronary artery of native heart without angina pectoris   Essential hypertension   Hyperlipidemia LDL goal <70   Diabetes mellitus without complication (HCC)   Prolonged QT interval   Hypokalemia  Discharge Instructions:  Allergies as of 08/26/2021       Reactions   Gabapentin Hives   Ibuprofen Other (See Comments)   Stomach  Upset and stomach ulcers   Zolpidem Tartrate Other (See Comments)   Hallucinations   Naproxen Other (See Comments)   HALLUCINATIONS        Medication List     STOP taking these medications    hydrochlorothiazide 25 MG tablet Commonly known as: HYDRODIURIL   latanoprost 0.005 % ophthalmic solution Commonly known as: XALATAN   sildenafil 100 MG tablet Commonly known as: VIAGRA       TAKE these medications    albuterol 108 (90 Base) MCG/ACT inhaler Commonly known as: VENTOLIN HFA Inhale 2 puffs into the lungs every 6 (six) hours as needed for wheezing or shortness of breath.   albuterol (2.5 MG/3ML) 0.083% nebulizer solution Commonly known as: PROVENTIL Take 2.5 mg by nebulization every 6 (six) hours as needed for wheezing or shortness of breath.   ALPRAZolam 1 MG  tablet Commonly known as: XANAX Take 1 mg by mouth 3 (three) times daily.   amLODipine 5 MG tablet Commonly known as: NORVASC TAKE 1 TABLET(5 MG) BY MOUTH DAILY   Arnuity Ellipta 200 MCG/ACT Aepb Generic drug: Fluticasone Furoate Inhale 1 puff into the lungs daily as needed (shortness of breath).   atorvastatin 80 MG tablet Commonly known as: LIPITOR Take 80 mg by mouth at  bedtime.   beclomethasone 80 MCG/ACT inhaler Commonly known as: QVAR Inhale 2 puffs into the lungs 2 (two) times daily as needed (respiratory issues.).   Belsomra 15 MG Tabs Generic drug: Suvorexant Take 1 tablet by mouth at bedtime.   brimonidine 0.1 % Soln Commonly known as: ALPHAGAN P Place 1 drop into the right eye 2 (two) times daily.   cetirizine 10 MG tablet Commonly known as: ZYRTEC Take 10 mg by mouth daily as needed for allergies.   clopidogrel 75 MG tablet Commonly known as: PLAVIX Take 75 mg by mouth daily.   dexlansoprazole 60 MG capsule Commonly known as: Dexilant Take 1 capsule (60 mg total) by mouth daily.   ezetimibe 10 MG tablet Commonly known as: ZETIA Take 10 mg by mouth daily.   fluticasone 50 MCG/ACT nasal spray Commonly known as: FLONASE Place 2 sprays into both nostrils daily.   furosemide 40 MG tablet Commonly known as: LASIX TAKE 1 TABLET(40 MG) BY MOUTH DAILY   glipiZIDE 10 MG tablet Commonly known as: GLUCOTROL Take 10 mg by mouth daily before breakfast.   ipratropium 0.02 % nebulizer solution Commonly known as: ATROVENT Take 0.5 mg by nebulization 2 (two) times daily as needed for wheezing or shortness of breath (for congestion).   ketoconazole 2 % shampoo Commonly known as: NIZORAL Apply 1 application topically daily as needed for irritation.   Linzess 290 MCG Caps capsule Generic drug: linaclotide TAKE 1 CAPSULE(290 MCG) BY MOUTH DAILY BEFORE BREAKFAST   lisinopril-hydrochlorothiazide 10-12.5 MG tablet Commonly known as: ZESTORETIC Take 1 tablet by mouth daily.   LIVER PO Take 1 tablet by mouth daily. "Liver Aid"   magnesium citrate Soln Take 1 Bottle by mouth as needed for moderate constipation.   metFORMIN 500 MG 24 hr tablet Commonly known as: GLUCOPHAGE-XR Take 500 mg by mouth daily with breakfast.   methocarbamol 500 MG tablet Commonly known as: ROBAXIN Take 500 mg by mouth every 6 (six) hours as needed.    metoprolol tartrate 50 MG tablet Commonly known as: LOPRESSOR TAKE 1 TABLET(50 MG) BY MOUTH TWICE DAILY   multivitamin with minerals Tabs tablet Take 1 tablet by mouth daily.   naloxegol oxalate 25 MG Tabs tablet Commonly known as: Movantik Take 1 tablet (25 mg total) by mouth daily.   Narcan 4 MG/0.1ML Liqd nasal spray kit Generic drug: naloxone Place 1 spray into the nose as needed (opioid overdose).   nitroGLYCERIN 0.4 MG SL tablet Commonly known as: NITROSTAT Place 1 tablet (0.4 mg total) under the tongue every 5 (five) minutes as needed for chest pain.   oxyCODONE-acetaminophen 10-325 MG tablet Commonly known as: PERCOCET Take 1 tablet by mouth every 4 (four) hours as needed for pain.   potassium chloride SA 20 MEQ tablet Commonly known as: KLOR-CON M Take 20 mEq by mouth 2 (two) times daily.   predniSONE 10 MG tablet Commonly known as: DELTASONE Take 40 mg by mouth daily.   Repatha SureClick 628 MG/ML Soaj Generic drug: Evolocumab ADMINISTER 1 ML UNDER THE SKIN EVERY 14 DAYS   sertraline 100 MG  tablet Commonly known as: ZOLOFT Take 100 mg by mouth daily.   sitaGLIPtin 100 MG tablet Commonly known as: JANUVIA Take 100 mg by mouth daily.   sucralfate 1 GM/10ML suspension Commonly known as: CARAFATE in the morning, at noon, in the evening, and at bedtime.   tamsulosin 0.4 MG Caps capsule Commonly known as: FLOMAX Take 1 capsule (0.4 mg total) by mouth daily after breakfast.   Vitamin D (Ergocalciferol) 1.25 MG (50000 UNIT) Caps capsule Commonly known as: DRISDOL Take 50,000 Units by mouth every 7 (seven) days. Monday        Follow-up Information     Nolene Ebbs, MD. Schedule an appointment as soon as possible for a visit in 1 week(s).   Specialty: Internal Medicine Why: Hospital Follow Up Contact information: [place] [address] [phone]         Belva Crome, MD .   Specialty: Cardiology Contact  information: [phone] [address] [place] [address] [phone]                Allergies  Allergen Reactions   Gabapentin Hives   Ibuprofen Other (See Comments)    Stomach  Upset and stomach ulcers   Zolpidem Tartrate Other (See Comments)    Hallucinations    Naproxen Other (See Comments)    HALLUCINATIONS   Allergies as of 08/26/2021       Reactions   Gabapentin Hives   Ibuprofen Other (See Comments)   Stomach  Upset and stomach ulcers   Zolpidem Tartrate Other (See Comments)   Hallucinations   Naproxen Other (See Comments)   HALLUCINATIONS        Medication List     STOP taking these medications    hydrochlorothiazide 25 MG tablet Commonly known as: HYDRODIURIL   latanoprost 0.005 % ophthalmic solution Commonly known as: XALATAN   sildenafil 100 MG tablet Commonly known as: VIAGRA       TAKE these medications    albuterol 108 (90 Base) MCG/ACT inhaler Commonly known as: VENTOLIN HFA Inhale 2 puffs into the lungs every 6 (six) hours as needed for wheezing or shortness of breath.   albuterol (2.5 MG/3ML) 0.083% nebulizer solution Commonly known as: PROVENTIL Take 2.5 mg by nebulization every 6 (six) hours as needed for wheezing or shortness of breath.   ALPRAZolam 1 MG tablet Commonly known as: XANAX Take 1 mg by mouth 3 (three) times daily.   amLODipine 5 MG tablet Commonly known as: NORVASC TAKE 1 TABLET(5 MG) BY MOUTH DAILY   Arnuity Ellipta 200 MCG/ACT Aepb Generic drug: Fluticasone Furoate Inhale 1 puff into the lungs daily as needed (shortness of breath).   atorvastatin 80 MG tablet Commonly known as: LIPITOR Take 80 mg by mouth at bedtime.   beclomethasone 80 MCG/ACT inhaler Commonly known as: QVAR Inhale 2 puffs into the lungs 2 (two) times daily as needed (respiratory issues.).   Belsomra 15 MG Tabs Generic drug: Suvorexant Take 1 tablet by mouth at bedtime.   brimonidine 0.1 % Soln Commonly known as:  ALPHAGAN P Place 1 drop into the right eye 2 (two) times daily.   cetirizine 10 MG tablet Commonly known as: ZYRTEC Take 10 mg by mouth daily as needed for allergies.   clopidogrel 75 MG tablet Commonly known as: PLAVIX Take 75 mg by mouth daily.   dexlansoprazole 60 MG capsule Commonly known as: Dexilant Take 1 capsule (60 mg total) by mouth daily.   ezetimibe 10 MG  tablet Commonly known as: ZETIA Take 10 mg by mouth daily.   fluticasone 50 MCG/ACT nasal spray Commonly known as: FLONASE Place 2 sprays into both nostrils daily.   furosemide 40 MG tablet Commonly known as: LASIX TAKE 1 TABLET(40 MG) BY MOUTH DAILY   glipiZIDE 10 MG tablet Commonly known as: GLUCOTROL Take 10 mg by mouth daily before breakfast.   ipratropium 0.02 % nebulizer solution Commonly known as: ATROVENT Take 0.5 mg by nebulization 2 (two) times daily as needed for wheezing or shortness of breath (for congestion).   ketoconazole 2 % shampoo Commonly known as: NIZORAL Apply 1 application topically daily as needed for irritation.   Linzess 290 MCG Caps capsule Generic drug: linaclotide TAKE 1 CAPSULE(290 MCG) BY MOUTH DAILY BEFORE BREAKFAST   lisinopril-hydrochlorothiazide 10-12.5 MG tablet Commonly known as: ZESTORETIC Take 1 tablet by mouth daily.   LIVER PO Take 1 tablet by mouth daily. "Liver Aid"   magnesium citrate Soln Take 1 Bottle by mouth as needed for moderate constipation.   metFORMIN 500 MG 24 hr tablet Commonly known as: GLUCOPHAGE-XR Take 500 mg by mouth daily with breakfast.   methocarbamol 500 MG tablet Commonly known as: ROBAXIN Take 500 mg by mouth every 6 (six) hours as needed.   metoprolol tartrate 50 MG tablet Commonly known as: LOPRESSOR TAKE 1 TABLET(50 MG) BY MOUTH TWICE DAILY   multivitamin with minerals Tabs tablet Take 1 tablet by mouth daily.   naloxegol oxalate 25 MG Tabs tablet Commonly known as: Movantik Take 1 tablet (25 mg total) by mouth  daily.   Narcan 4 MG/0.1ML Liqd nasal spray kit Generic drug: naloxone Place 1 spray into the nose as needed (opioid overdose).   nitroGLYCERIN 0.4 MG SL tablet Commonly known as: NITROSTAT Place 1 tablet (0.4 mg total) under the tongue every 5 (five) minutes as needed for chest pain.   oxyCODONE-acetaminophen 10-325 MG tablet Commonly known as: PERCOCET Take 1 tablet by mouth every 4 (four) hours as needed for pain.   potassium chloride SA 20 MEQ tablet Commonly known as: KLOR-CON M Take 20 mEq by mouth 2 (two) times daily.   predniSONE 10 MG tablet Commonly known as: DELTASONE Take 40 mg by mouth daily.   Repatha SureClick 140 MG/ML Soaj Generic drug: Evolocumab ADMINISTER 1 ML UNDER THE SKIN EVERY 14 DAYS   sertraline 100 MG tablet Commonly known as: ZOLOFT Take 100 mg by mouth daily.   sitaGLIPtin 100 MG tablet Commonly known as: JANUVIA Take 100 mg by mouth daily.   sucralfate 1 GM/10ML suspension Commonly known as: CARAFATE in the morning, at noon, in the evening, and at bedtime.   tamsulosin 0.4 MG Caps capsule Commonly known as: FLOMAX Take 1 capsule (0.4 mg total) by mouth daily after breakfast.   Vitamin D (Ergocalciferol) 1.25 MG (50000 UNIT) Caps capsule Commonly known as: DRISDOL Take 50,000 Units by mouth every 7 (seven) days. Monday        Procedures/Studies: DG Chest 2 View  Result Date: 08/25/2021 CLINICAL DATA:  Chest pain. EXAM: CHEST - 2 VIEW COMPARISON:  01/28/2016 FINDINGS: The heart size and mediastinal contours are within normal limits. A vascular stent is noted in the superior mediastinum on the left. Lung volumes are low. No consolidation, effusion, or pneumothorax. Sternotomy wires are present over the midline. Sternotomy wires are noted over the midline. Mild degenerative changes are present in the thoracic spine. IMPRESSION: No active cardiopulmonary disease. Electronically Signed   By: Charlestine Night.D.  On: 08/25/2021 22:11      Subjective: Pt says he is feeling better no chest pain symptoms, no shortness of breath, he says he will follow up with his PCP and cardiologist   Discharge Exam: Vitals:   08/26/21 0112 08/26/21 0505  BP: 132/87 (!) 132/97  Pulse: (!) 59 78  Resp: 20 19  Temp: 98.7 F (37.1 C) 97.7 F (36.5 C)  SpO2: 100% 98%   Vitals:   08/26/21 0030 08/26/21 0058 08/26/21 0112 08/26/21 0505  BP: 119/80  132/87 (!) 132/97  Pulse: (!) 35  (!) 59 78  Resp:   20 19  Temp:   98.7 F (37.1 C) 97.7 F (36.5 C)  TempSrc:    Oral  SpO2: 94%  100% 98%  Weight:  127.3 kg    Height:  $Remove'5\' 10"'[id number]$  (7.017 m)     General: Pt is alert, awake, not in acute distress Cardiovascular: RRR, S1/S2 +, no rubs, no gallops Respiratory: CTA bilaterally, no wheezing, no rhonchi Abdominal: Soft, NT, ND, bowel sounds + Extremities: no edema, no cyanosis   The results of significant diagnostics from this hospitalization (including imaging, microbiology, ancillary and laboratory) are listed below for reference.     Microbiology: No results found for this or any previous visit (from the past 240 hour(s)).   Labs: BNP (last 3 results) Recent Labs    08/25/21 2144  BNP 79.3   Basic Metabolic Panel: Recent Labs  Lab 08/25/21 2144 08/26/21 0512  NA 138 139  K 2.5* 3.5  CL 100 102  CO2 29 27  GLUCOSE 103* 159*  BUN 14 11  CREATININE 1.04 0.86  CALCIUM 9.0 8.9  MG 1.7 1.7  PHOS  --  3.1   Liver Function Tests: Recent Labs  Lab 08/26/21 0512  AST 24  ALT 24  ALKPHOS 63  BILITOT 0.7  PROT 7.1  ALBUMIN 4.0   No results for input(s): "LIPASE", "AMYLASE" in the last 168 hours. No results for input(s): "AMMONIA" in the last 168 hours. CBC: Recent Labs  Lab 08/25/21 2144 08/26/21 0512  WBC 6.0 6.6  HGB 12.6* 12.8*  HCT 35.8* 38.7*  MCV 86.7 90.8  PLT 201 212   Cardiac Enzymes: No results for input(s): "CKTOTAL", "CKMB", "CKMBINDEX", "TROPONINI" in the last 168 hours. BNP: Invalid input(s):  "POCBNP" CBG: Recent Labs  Lab 08/26/21 0748  GLUCAP 151*   D-Dimer No results for input(s): "DDIMER" in the last 72 hours. Hgb A1c Recent Labs    08/25/21 2341  HGBA1C 5.9*   Lipid Profile No results for input(s): "CHOL", "HDL", "LDLCALC", "TRIG", "CHOLHDL", "LDLDIRECT" in the last 72 hours. Thyroid function studies No results for input(s): "TSH", "T4TOTAL", "T3FREE", "THYROIDAB" in the last 72 hours.  Invalid input(s): "FREET3" Anemia work up No results for input(s): "VITAMINB12", "FOLATE", "FERRITIN", "TIBC", "IRON", "RETICCTPCT" in the last 72 hours. Urinalysis    Component Value Date/Time   COLORURINE YELLOW 11/21/2015 2301   APPEARANCEUR CLEAR 11/21/2015 2301   LABSPEC 1.019 11/21/2015 2301   PHURINE 7.0 11/21/2015 2301   GLUCOSEU NEGATIVE 11/21/2015 2301   HGBUR NEGATIVE 11/21/2015 2301   BILIRUBINUR NEGATIVE 11/21/2015 2301   KETONESUR NEGATIVE 11/21/2015 2301   PROTEINUR NEGATIVE 11/21/2015 2301   UROBILINOGEN 1.0 09/25/2011 1513   NITRITE NEGATIVE 11/21/2015 2301   LEUKOCYTESUR NEGATIVE 11/21/2015 2301   Sepsis Labs Recent Labs  Lab 08/25/21 2144 08/26/21 0512  WBC 6.0 6.6   Microbiology No results found for this or any previous visit (from  the past 240 hour(s)).  Time coordinating discharge: 35 mins  SIGNED:  Irwin Brakeman, MD  [facility] 08/26/2021, 10:31 AM How to contact the [facility] Attending or Consulting provider [place] or covering provider during after hours [place], for this patient?  Check the care team in [facility] and look for a) attending/consulting [facility] provider listed and b) the [facility] team listed Log into [email or web address] and use [place]'s universal password to access. If you do not have the password, please contact the hospital operator. Locate the [facility] provider you are looking for under [facility] and page to a number that you can be directly reached. If you still have difficulty reaching the provider, please page the [facility]  (Director on Call) for the Hospitalists listed on amion for assistance.

## 2021-09-12 NOTE — Progress Notes (Deleted)
Cardiology Office Note:    Date:  09/12/2021   ID:  Dalton [name], DOB [birth year], MRN [id number]  PCP:  Nolene Ebbs, MD  Cardiologist:  Sinclair Grooms, MD   Referring MD: Nolene Ebbs, MD   No chief complaint on file.   History of Present Illness:    Dalton [name] is a 60 y.o. male with a hx of CAD with prior single-vessel CABG 2002 (LIMA to LAD, cath 08/2011  atretic LIMA with normal native coronary arteries, multiple prior NSTEMI (setting of cocaine use), ischemic cardiomyopathy (low EF--> EF60-65% echo 2021) ), HTN, HLD, type 2 diabetes mellitus, left subclavian stent, rheumatoid arthritis, DVT, peptic ulcer disease, and bipolar disorder.  When last seen was having DOE and w/u with monitor and echo noted below.  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[place])     last seizure in 2011;unknown etiology-pt sts heriditary (12/24/2015)   YDXAJOIN(867.6)    "a few times/week" (12/24/2015)   High cholesterol    HNP (herniated nucleus pulposus), cervical    Hypertension    Laceration of right hand 11/27/2010   Laceration of wrist 2007 BIL FOREARMS   MI (myocardial infarction) ([place])    7, last one was in 2011 (12/24/2015)   Neuromuscular disorder ([place])    legs/feet   Neuropathy    legs/feet   NSAID-induced gastric ulcer    "Ibuprofen"   PUD (peptic ulcer disease)    in 1990s, secondary to medication   Rheumatoid arthritis ([place])    "all over" (12/24/2015)    Shortness of breath    with exertion   Tonsillitis, chronic    Dr. Vicki Mallet in [place]   Type II diabetes mellitus ([place])    Wears glasses     Past Surgical History:  Procedure Laterality Date   ANTERIOR CERVICAL DECOMP/DISCECTOMY FUSION N/A 11/05/2016   Procedure: Anterior Cervical Discectomy and Fusion - Cervical three-Cervical four;  Surgeon: Earnie Larsson, MD;  Location: [facility];  Service: Neurosurgery;  Laterality: N/A;   BACK SURGERY     x3   BALLOON DILATION N/A 12/04/2019   Procedure: BALLOON DILATION;  Surgeon: Eloise Harman, DO;  [facility];  Service: Endoscopy;  Laterality: N/A;   BIOPSY N/A 05/30/2012   Procedure: BIOPSY;  Surgeon: Danie Binder, MD;  [facility];  Service: Endoscopy;  Laterality: N/A;   BIOPSY  03/02/2016   Procedure: BIOPSY;  Surgeon: Danie Binder, MD;  [facility];  Service: Endoscopy;;  gastric   BIOPSY  12/04/2019   Procedure: BIOPSY;  Surgeon: Eloise Harman, DO;  [facility];  Service: Endoscopy;;   CARDIAC CATHETERIZATION  "several"   CARPAL TUNNEL RELEASE Bilateral    CATARACT EXTRACTION W/PHACO Right 06/15/2016   Procedure: CATARACT EXTRACTION PHACO AND  INTRAOCULAR LENS PLACEMENT (IOC);  Surgeon: Rutherford Guys, MD;  [facility];  Service: Ophthalmology;  Laterality: Right;  CDE: 4.64   CATARACT EXTRACTION W/PHACO Left 07/13/2016   Procedure: CATARACT EXTRACTION PHACO AND INTRAOCULAR LENS PLACEMENT (IOC);  Surgeon: Rutherford Guys, MD;  [facility];  Service: Ophthalmology;  Laterality: Left;  CDE: 3.15   COLONOSCOPY  12/28/2010   SLF: (MAC)Internal hemorrhoids/four small colon polyps tubular adenomas. per SLF: colonoscopy 2022   COLONOSCOPY WITH PROPOFOL N/A 07/05/2017   Surgeon: Danie Binder, MD; tubular adenoma, hyperplastic polyp, submucosal nodule at hepatic flexure consistent with lipoma on biopsy, diverticulosis and rectosigmoid and sigmoid colon, rectal bleeding due to large  internal hemorrhoids.  Recommended repeat in 3 years due to history of polyps and oily film on scope.   CORONARY ARTERY BYPASS GRAFT  2002   3 vessels   ESOPHAGOGASTRODUODENOSCOPY N/A 05/30/2012   SLF: UNCONTROLLED GERD DUE TO LIFESTYLE CHOICE/WEIGHT GAIN/MILD Non-erosive gastritis   ESOPHAGOGASTRODUODENOSCOPY (EGD) WITH PROPOFOL N/A 03/02/2016   Dr. Oneida Alar: Esophagus appeared normal, impaired dilation performed, patchy inflammation with edema and erythema of the entire stomach., Biopsy with H pylori, patient completed Pylera.    ESOPHAGOGASTRODUODENOSCOPY (EGD) WITH PROPOFOL N/A 12/04/2019   Surgeon: Eloise Harman, DO; benign appearing esophageal stenosis dilated, gastritis biopsied (mild reactive gastropathy, gastritis, negative H. pylori), normal duodenum.   FRACTURE SURGERY     LEFT HEART CATHETERIZATION WITH CORONARY ANGIOGRAM N/A 08/31/2011   Procedure: LEFT HEART CATHETERIZATION WITH CORONARY ANGIOGRAM;  Surgeon: Laverda Page, MD;  [facility];  Service: Cardiovascular;  Laterality: N/A;   LUMBAR DISC SURGERY     "L4-5; Dr. Trenton Gammon"   LUMBAR LAMINECTOMY/DECOMPRESSION MICRODISCECTOMY Right 03/06/2019   Procedure: MICRODISCECTOMY EXTRAFORAMINAL LUMBAR THREE - LUMBAR FOUR RIGHT;  Surgeon: Earnie Larsson, MD;  Location: [facility];  Service: Neurosurgery;  Laterality: Right;  MICRODISCECTOMY EXTRAFORAMINAL LUMBAR THREE - LUMBAR FOUR RIGHT   MULTIPLE TOOTH EXTRACTIONS     ORIF MANDIBULAR FRACTURE N/A 12/19/2015   Procedure: OPEN REDUCTION INTERNAL FIXATION (ORIF) MANDIBULAR FRACTURE;  Surgeon: Izora Gala, MD;  Location: [facility];  Service: ENT;  Laterality: N/A;   PATELLA FRACTURE SURGERY Left 1976   plate to knee cap from accident   POLYPECTOMY  07/05/2017   Procedure: POLYPECTOMY;  Surgeon: Danie Binder, MD;  [facility];  Service: Endoscopy;;  colon   SAVORY DILATION  12/28/2010   SLF:(MAC)J-shaped stomach/nodular mocosa in the distal esophagus/empiric dilation 23m    SAVORY DILATION N/A 03/02/2016   Procedure: SAVORY DILATION;  Surgeon: SDanie Binder MD;  [facility];  Service: Endoscopy;  Laterality: N/A;    Current Medications: No outpatient medications have been marked as taking for the 09/14/21 encounter (Appointment) with SBelva Crome MD.     Allergies:   Gabapentin, Ibuprofen, Zolpidem tartrate, and Naproxen   Social History   Socioeconomic History   Marital status: Single    Spouse name: Not on file   Number of children: 2   Years of education: Not on file   Highest education level: Not on file  Occupational History   Occupation: disabled    Employer: NOT EMPLOYED  Tobacco Use   Smoking status: Never   Smokeless tobacco: Never  Vaping Use   Vaping Use: Never used  Substance and Sexual Activity   Alcohol use: No    Alcohol/week: 0.0 standard drinks of alcohol   Drug use: Not Currently    Types: "Crack" cocaine, Cocaine, Marijuana  Comment: 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)   Sexual activity: Not Currently  Other Topics Concern   Not on file  Social History Narrative   Lives w/ son-23/22   Social Determinants of Health   Financial Resource Strain: Not on file  Food Insecurity: Not on file  Transportation Needs: Not on file  Physical Activity: Not on file  Stress: Not on file  Social Connections: Not on file     Family History: The patient's family history includes Diabetes in his father and mother; Heart attack in his brother, sister, and another family member; Heart attack (age of onset: 35) in his father; Heart attack (age of onset: 48) in his mother; Heart failure in an other family member; Hypertension in his father and mother; Seizures in his brother. There is no history of Colon cancer, Liver disease, Anesthesia problems, Hypotension, Malignant hyperthermia, Pseudochol deficiency, or Colon polyps.  ROS:   Please see the history of present illness.    *** All other systems reviewed and are  negative.  EKGs/Labs/Other Studies Reviewed:    The following studies were reviewed today: LONG TERM MONITOR 05/29/2021: Study Highlights    Normal sinus rhythm Frequent, high burden (25%), symptomatic non-sustained SVT Longest SVT run lasted 25 seconds at 111 bpm. Fastest SVT > 200 bpm.    LEXISCAN Myoview 05/25/2021: Study Highlights      The study is normal. The study is low risk.   No ST deviation was noted.   LV perfusion is normal. There is no evidence of ischemia. There is no evidence of infarction.   Left ventricular function is normal. Nuclear stress EF: 56 %. The left ventricular ejection fraction is normal (55-65%). End diastolic cavity size is mildly enlarged. End systolic cavity size is mildly enlarged.   Prior study available for comparison from 11/01/2018.   Normal resting and stress perfusion. No ischemia or infarction EF 56%  ECHOCARDIOGRAPHY 10/08/2019:  IMPRESSIONS   1. Left ventricular ejection fraction, by estimation, is 60 to 65%. The  left ventricle has normal function. The left ventricle has no regional  wall motion abnormalities. There is moderate left ventricular hypertrophy.  Left ventricular diastolic  parameters are consistent with Grade I diastolic dysfunction (impaired  relaxation).   2. Right ventricular systolic function is normal. The right ventricular  size is normal. There is normal pulmonary artery systolic pressure.   3. The mitral valve is abnormal. Trivial mitral valve regurgitation.   4. The aortic valve is tricuspid. Aortic valve regurgitation is not  visualized.   5. The inferior vena cava is normal in size with greater than 50%  respiratory variability, suggesting right atrial pressure of 3 mmHg.   Comparison(s): Changes from prior study are noted. 06/30/2017: LVEF 60-65%,  mild LVH.   EKG:  EKG ***  Recent Labs: 03/11/2021: TSH 1.81 08/25/2021: B Natriuretic Peptide 60.0 08/26/2021: ALT 24; BUN 11; Creatinine, Ser 0.86; Hemoglobin  12.8; Magnesium 1.7; Platelets 212; Potassium 3.5; Sodium 139  Recent Lipid Panel    Component Value Date/Time   CHOL 125 03/11/2021 0000   CHOL 131 08/14/2019 1238   TRIG 162 (H) 03/11/2021 0000   HDL 59 03/11/2021 0000   HDL 37 (L) 08/14/2019 1238   CHOLHDL 2.1 03/11/2021 0000   VLDL 69 (H) 12/16/2015 0630   LDLCALC 42 03/11/2021 0000    Physical Exam:    VS:  There were no vitals taken for this visit.    Wt Readings from Last 3 Encounters:  08/26/21  280 lb 11.2 oz (127.3 kg)  06/15/21 271 lb (122.9 kg)  05/25/21 245 lb (111.1 kg)     GEN: ***. No acute distress HEENT: Normal NECK: No JVD. LYMPHATICS: No lymphadenopathy CARDIAC: *** murmur. RRR *** gallop, or edema. VASCULAR: *** Normal Pulses. No bruits. RESPIRATORY:  Clear to auscultation without rales, wheezing or rhonchi  ABDOMEN: Soft, non-tender, non-distended, No pulsatile mass, MUSCULOSKELETAL: No deformity  SKIN: Warm and dry NEUROLOGIC:  Alert and oriented x 3 PSYCHIATRIC:  Normal affect   ASSESSMENT:    1. Coronary artery disease involving coronary bypass graft of native heart with unstable angina pectoris ([place])   2. Essential hypertension   3. DOE (dyspnea on exertion)   4. Hyperlipidemia LDL goal <70   5. Chronic heart failure with preserved ejection fraction (HCC)   6. Bipolar 1 disorder, depressed, severe ([place])    PLAN:    In order of problems listed above:  ***   Medication Adjustments/Labs and Tests Ordered: Current medicines are reviewed at length with the patient today.  Concerns regarding medicines are outlined above.  No orders of the defined types were placed in this encounter.  No orders of the defined types were placed in this encounter.   There are no Patient Instructions on file for this visit.   Signed, Sinclair Grooms, MD  09/12/2021 12:22 PM    [facility]

## 2021-09-14 ENCOUNTER — Ambulatory Visit: Payer: Medicare Other | Admitting: Interventional Cardiology

## 2021-09-14 DIAGNOSIS — I5032 Chronic diastolic (congestive) heart failure: Secondary | ICD-10-CM

## 2021-09-14 DIAGNOSIS — I10 Essential (primary) hypertension: Secondary | ICD-10-CM

## 2021-09-14 DIAGNOSIS — F314 Bipolar disorder, current episode depressed, severe, without psychotic features: Secondary | ICD-10-CM

## 2021-09-14 DIAGNOSIS — R0609 Other forms of dyspnea: Secondary | ICD-10-CM

## 2021-09-14 DIAGNOSIS — I25700 Atherosclerosis of coronary artery bypass graft(s), unspecified, with unstable angina pectoris: Secondary | ICD-10-CM

## 2021-09-14 DIAGNOSIS — E785 Hyperlipidemia, unspecified: Secondary | ICD-10-CM

## 2021-10-04 NOTE — Progress Notes (Deleted)
Cardiology Office Note:    Date:  10/04/2021   ID:  Dalton [name], DOB 05/14/1960, MRN [id number]  PCP:  Nolene Ebbs, MD   [facility] Providers Cardiologist:  Sinclair Grooms, MD { Click to update primary MD,subspecialty MD or APP then REFRESH:1}    Referring MD: Nolene Ebbs, MD   Chief Complaint: ***  History of Present Illness:    Dalton [name] is a *** 61 y.o. 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   Echo 09/2019 LVEF 60-65%, G1DD. Coronary CT 09/2019 with calcium score of 0, no evidence of CAD. Seen 05/14/21 by Robbie Lis, PA for CP, SOB, and palpitations. Lexiscan myoview without ischemia or infarction, EF 56% 05/26/21 with continued angina. Cardiac monitor with sinus rhythm and nonsustained symptomatic SVT. Advised by Dr. Tamala Julian to undergo ETT for functional status on current medication not completed.  He was last seen in our office by Robbie Lis, [place] on 06/15/21.  Continue to have exertional shortness of breath with occasional chest tightness, felt need to stop and catch his breath. Felt to have microvascular angina by primary cardiologist, Dr. Tamala Julian at a visit on 01/20/21.  Admission 7/4-08/26/21 with mid-sternal chest pain radiating to left arm. Pain non-reproducable, no associated symptoms. Had removal of multiple prostate polyps 6/29. 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[place])     last seizure in 2011;unknown etiology-pt sts heriditary (12/24/2015)   IRCVELFY(101.7)    "a few times/week" (12/24/2015)   High cholesterol    HNP (herniated nucleus pulposus), cervical    Hypertension    Laceration of right hand 11/27/2010   Laceration of wrist 2007 BIL FOREARMS   MI (myocardial infarction) ([place])    7, last one was in 2011 (12/24/2015)   Neuromuscular disorder ([place])    legs/feet   Neuropathy    legs/feet   NSAID-induced gastric ulcer    "Ibuprofen"   PUD (peptic ulcer disease)    in 1990s, secondary to medication   Rheumatoid arthritis ([place])    "all over" (12/24/2015)   Shortness of breath    with exertion   Tonsillitis, chronic    Dr. Vicki Mallet in [place]   Type II diabetes mellitus [facility])    Wears glasses     Past Surgical History:  Procedure Laterality Date   ANTERIOR CERVICAL DECOMP/DISCECTOMY FUSION N/A 11/05/2016   Procedure: Anterior Cervical Discectomy and Fusion - Cervical three-Cervical four;  Surgeon: Earnie Larsson, MD;  Location: [facility]  OR;  Service: Neurosurgery;  Laterality: N/A;   BACK SURGERY     x3   BALLOON DILATION N/A 12/04/2019   Procedure: BALLOON DILATION;  Surgeon: Eloise Harman, DO;  [facility];  Service: Endoscopy;  Laterality: N/A;   BIOPSY N/A 05/30/2012   Procedure: BIOPSY;  Surgeon: Danie Binder, MD;  Location: [facility] ORS;  Service: Endoscopy;  Laterality: N/A;   BIOPSY  03/02/2016   Procedure: BIOPSY;  Surgeon: Danie Binder, MD;  [facility];  Service: Endoscopy;;  gastric   BIOPSY  12/04/2019   Procedure: BIOPSY;  Surgeon: Eloise Harman, DO;  [facility];  Service: Endoscopy;;   CARDIAC CATHETERIZATION  "several"    CARPAL TUNNEL RELEASE Bilateral    CATARACT EXTRACTION W/PHACO Right 06/15/2016   Procedure: CATARACT EXTRACTION PHACO AND INTRAOCULAR LENS PLACEMENT ([place]);  Surgeon: Rutherford Guys, MD;  Location: [facility] ORS;  Service: Ophthalmology;  Laterality: Right;  CDE: 4.64   CATARACT EXTRACTION W/PHACO Left 07/13/2016   Procedure: CATARACT EXTRACTION PHACO AND INTRAOCULAR LENS PLACEMENT (IOC);  Surgeon: Rutherford Guys, MD;  Location: [facility] OR[facility];  Service: Ophthalmology;  Laterality: Left;  CDE: 3.15   COLONOSCOPY  12/28/2010   SLF: (MAC)Internal hemorrhoids/four small colon polyps tubular adenomas. per SLF: colonoscopy 2022   COLONOSCOPY WITH PROPOFOL N/A 07/05/2017   Surgeon: Danie Binder, MD; tubular adenoma, hyperplastic polyp, submucosal nodule at hepatic flexure consistent with lipoma on biopsy, diverticulosis and rectosigmoid and sigmoid colon, rectal bleeding due to large internal hemorrhoids.  Recommended repeat in 3 years due to history of polyps and oily film on scope.   CORONARY ARTERY BYPASS GRAFT  2002   3 vessels   ESOPHAGOGASTRODUODENOSCOPY N/A 05/30/2012   SLF: UNCONTROLLED GERD DUE TO LIFESTYLE CHOICE/WEIGHT GAIN/MILD Non-erosive gastritis   ESOPHAGOGASTRODUODENOSCOPY (EGD) WITH PROPOFOL N/A 03/02/2016   Dr. Oneida Alar: Esophagus appeared normal, impaired dilation performed, patchy inflammation with edema and erythema of the entire stomach., Biopsy with H pylori, patient completed Pylera.    ESOPHAGOGASTRODUODENOSCOPY (EGD) WITH PROPOFOL N/A 12/04/2019   Surgeon: Eloise Harman, DO; benign appearing esophageal stenosis dilated, gastritis biopsied (mild reactive gastropathy, gastritis, negative H. pylori), normal duodenum.   FRACTURE SURGERY     LEFT HEART CATHETERIZATION WITH CORONARY ANGIOGRAM N/A 08/31/2011   Procedure: LEFT HEART CATHETERIZATION WITH CORONARY ANGIOGRAM;  Surgeon: Laverda Page, MD;  [facility] C[facility] LAB;  Service: Cardiovascular;  Laterality: N/A;   LUMBAR DISC SURGERY      "L4-5; Dr. Trenton Gammon"   LUMBAR LAMINECTOMY/DECOMPRESSION MICRODISCECTOMY Right 03/06/2019   Procedure: MICRODISCECTOMY EXTRAFORAMINAL LUMBAR THREE - LUMBAR FOUR RIGHT;  Surgeon: Earnie Larsson, MD;  Location: [place];  Service: Neurosurgery;  Laterality: Right;  MICRODISCECTOMY EXTRAFORAMINAL LUMBAR THREE - LUMBAR FOUR RIGHT   MULTIPLE TOOTH EXTRACTIONS     ORIF MANDIBULAR FRACTURE N/A 12/19/2015   Procedure: OPEN REDUCTION INTERNAL FIXATION (ORIF) MANDIBULAR FRACTURE;  Surgeon: Izora Gala, MD;  Location: [place];  Service: ENT;  Laterality: N/A;   PATELLA FRACTURE SURGERY Left 1976   plate to knee cap from accident   POLYPECTOMY  07/05/2017   Procedure: POLYPECTOMY;  Surgeon: Danie Binder, MD;  [facility];  Service: Endoscopy;;  colon   SAVORY DILATION  12/28/2010   SLF:(MAC)J-shaped stomach/nodular mocosa in the distal esophagus/empiric dilation 62m   SAVORY DILATION N/A 03/02/2016   Procedure: SAVORY DILATION;  Surgeon: SDanie Binder MD;  [facility];  Service: Endoscopy;  Laterality: N/A;    Current  Medications: No outpatient medications have been marked as taking for the 10/06/21 encounter (Appointment) with Ann Maki, Lanice Schwab, NP.     Allergies:   Gabapentin, Ibuprofen, Zolpidem tartrate, and Naproxen   Social History   Socioeconomic History   Marital status: Single    Spouse name: Not on file   Number of children: 2   Years of education: Not on file   Highest education level: Not on file  Occupational History   Occupation: disabled    Employer: NOT EMPLOYED  Tobacco Use   Smoking status: Never   Smokeless tobacco: Never  Vaping Use   Vaping Use: Never used  Substance and Sexual Activity   Alcohol use: No    Alcohol/week: 0.0 standard drinks of alcohol   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)   Sexual activity: Not Currently  Other Topics Concern   Not on file   Social History Narrative   Lives w/ son-23/22   Social Determinants of Health   Financial Resource Strain: Not on file  Food Insecurity: Not on file  Transportation Needs: Not on file  Physical Activity: Not on file  Stress: Not on file  Social Connections: Not on file     Family History: The patient's ***family history includes Diabetes in his father and mother; Heart attack in his brother, sister, and another family member; Heart attack (age of onset: 79) in his father; Heart attack (age of onset: 57) in his mother; Heart failure in an other family member; Hypertension in his father and mother; Seizures in his brother. There is no history of Colon cancer, Liver disease, Anesthesia problems, Hypotension, Malignant hyperthermia, Pseudochol deficiency, or Colon polyps.  ROS:   Please see the history of present illness.    *** All other systems reviewed and are negative.  Labs/Other Studies Reviewed:    The following studies were reviewed today:  Lexiscan myoview 05/26/21    The study is normal. The study is low risk.   No ST deviation was noted.   LV perfusion is normal. There is no evidence of ischemia. There is no evidence of infarction.   Left ventricular function is normal. Nuclear stress EF: 56 %. The left ventricular ejection fraction is normal (55-65%). End diastolic cavity size is mildly enlarged. End systolic cavity size is mildly enlarged.   Prior study available for comparison from 11/01/2018.   Normal resting and stress perfusion. No ischemia or infarction EF 56%  Cardiac monitor 05/29/21  Normal sinus rhythm Frequent, high burden (25%), symptomatic non-sustained SVT Longest SVT run lasted 25 seconds at 111 bpm. Fastest SVT > 200 bpm.  CCTA 10/18/19  1. Coronary calcium score of 0. This was 0 percentile for age and sex matched control.   2. Normal coronary origin with right dominance.   3. No evidence of CAD.  Echo 10/18/19   1. Left ventricular ejection  fraction, by estimation, is 60 to 65%. The  left ventricle has normal function. The left ventricle has no regional  wall motion abnormalities. There is moderate left ventricular hypertrophy.  Left ventricular diastolic  parameters are consistent with Grade I diastolic dysfunction (impaired  relaxation).   2. Right ventricular systolic function is normal. The right ventricular  size is normal. There is normal pulmonary artery systolic pressure.   3. The mitral valve is abnormal. Trivial mitral valve regurgitation.   4. The aortic valve is tricuspid. Aortic valve  regurgitation is not  visualized.   5. The inferior vena cava is normal in size with greater than 50%  respiratory variability, suggesting right atrial pressure of 3 mmHg.   Comparison(s): Changes from prior study are noted. 06/30/2017: LVEF 60-65%,  mild LVH.   Recent Labs: 03/11/2021: TSH 1.81 08/25/2021: B Natriuretic Peptide 60.0 08/26/2021: ALT 24; BUN 11; Creatinine, Ser 0.86; Hemoglobin 12.8; Magnesium 1.7; Platelets 212; Potassium 3.5; Sodium 139  Recent Lipid Panel    Component Value Date/Time   CHOL 125 03/11/2021 0000   CHOL 131 08/14/2019 1238   TRIG 162 (H) 03/11/2021 0000   HDL 59 03/11/2021 0000   HDL 37 (L) 08/14/2019 1238   CHOLHDL 2.1 03/11/2021 0000   VLDL 69 (H) 12/16/2015 0630   LDLCALC 42 03/11/2021 0000     Risk Assessment/Calculations:   {Does this patient have ATRIAL FIBRILLATION?:[id number]}       Physical Exam:    VS:  There were no vitals taken for this visit.    Wt Readings from Last 3 Encounters:  08/26/21 280 lb 11.2 oz (127.3 kg)  06/15/21 271 lb (122.9 kg)  05/25/21 245 lb (111.1 kg)     GEN: *** Well nourished, well developed in no acute distress HEENT: Normal NECK: No JVD; No carotid bruits CARDIAC: ***RRR, no murmurs, rubs, gallops RESPIRATORY:  Clear to auscultation without rales, wheezing or rhonchi  ABDOMEN: Soft, non-tender, non-distended MUSCULOSKELETAL:  No edema; No  deformity. *** pedal pulses, ***bilaterally SKIN: Warm and dry NEUROLOGIC:  Alert and oriented x 3 PSYCHIATRIC:  Normal affect   EKG:  EKG is *** ordered today.  The ekg ordered today demonstrates ***  Diagnoses:    No diagnosis found. Assessment and Plan:     CAD s/p CABG Chest pain/DOE Hypertension: Hyperlipidemia LDL goal < 70  {Are you ordering a CV Procedure (e.g. stress test, cath, DCCV, TEE, etc)?   Press F2        :[id number]}    Medication Adjustments/Labs and Tests Ordered: Current medicines are reviewed at length with the patient today.  Concerns regarding medicines are outlined above.  No orders of the defined types were placed in this encounter.  No orders of the defined types were placed in this encounter.   There are no Patient Instructions on file for this visit.   Signed, Emmaline Life, NP  10/04/2021 1:51 PM    [facility]

## 2021-10-06 ENCOUNTER — Ambulatory Visit: Payer: Medicare Other | Admitting: Nurse Practitioner

## 2021-10-09 NOTE — Progress Notes (Unsigned)
Office Visit    Patient Name: Dalton [name] Date of Encounter: 10/09/2021  PCP:  Nolene Ebbs, MD   [place]  Cardiologist:  Sinclair Grooms, MD  Advanced Practice Provider:  No care team member to display Electrophysiologist:  None   HPI    Dalton [name] is a 61 y.o. 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  Patient was seen in the ED 08/25/2021 with a history of 4 to 5-day onset of intermittent chest pain.  He stated he had 3 polyps removed from his prostate on Thursday (6/29), on returning home, he was complaining of intermittent midsternal chest pain with radiation to left arm, chest pain was nonreproducible.  He was not sure if the chest pain was due to indigestion, heartburn, or cardiac chest pain.  Chest pain was associated with nausea and shortness of breath but no fever, chills, sweating, abdominal pain, or cough.  He states in the ED he was hemodynamically stable.  Patient was given morphine, Zofran, and Pepcid.  Potassium was replenished since it was low in the last was given.  Patient was admitted on telemetry and had serial troponins which were reassuring at 3.  He was treated with Protonix.  He did have a nuclear stress test done a few months ago April/2023 which was reassuring.  It was not believed that his symptoms were cardiac.  No acute ischemic changes on telemetry.  He was discharged with cardiology follow-up.  He was seen in our office May 14, 2021 for chest pain, shortness of breath, palpitations.  Symptoms were different than his prior anginal pain.  Follow-up with Lexiscan Myoview without ischemia or infection.   Monitor mostly showed sinus rhythm and nonsustained symptomatic SVT.  Reviewed with Dr. Tamala Julian at that time who recommended ETT on current medications to assess functional tolerance.  Patient was seen again 06/15/2021 and was having continued exertional shortness of breath with occasional chest tightness.  He would need to stop to catch his breath.  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  Denied dizziness, lower extremity edema, or melena.  He reported using a low-sodium diet.  Today, he presents for ED follow-up ***  Past Medical History    Past Medical History:  Diagnosis Date   Anemia    Anxiety    Asthma    Bipolar disorder ([place])    CHF (congestive heart failure) (HCC)    Chronic back pain    Pain Clinic in [place]   Chronic bronchitis    Chronic lower back pain    Congestive heart failure (CHF) (HCC)    COPD (chronic obstructive pulmonary disease) ([place])    per patient 03/05/19   Coronary artery disease    Coughing    Depression    DVT (deep venous thrombosis) ([place]) ~ 2005   LLE   Frequency of urination    GERD (gastroesophageal reflux disease)    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[place])     last seizure in 2011;unknown etiology-pt sts heriditary (12/24/2015)   ZOXWRUEA(540.9)    "a few times/week" (12/24/2015)   High cholesterol    HNP (herniated nucleus pulposus), cervical    Hypertension    Laceration of right hand 11/27/2010  Laceration of wrist 2007 BIL FOREARMS   MI (myocardial infarction) ([place])    7, last one was in 2011 (12/24/2015)   Neuromuscular disorder (HCC)    legs/feet   Neuropathy    legs/feet   NSAID-induced gastric ulcer    "Ibuprofen"   PUD (peptic ulcer disease)    in 1990s, secondary to medication   Rheumatoid arthritis ([place])    "all over" (12/24/2015)   Shortness of breath    with exertion   Tonsillitis, chronic    Dr. Vicki Mallet in [place]   Type II diabetes mellitus [facility])    Wears glasses    Past Surgical History:  Procedure Laterality Date   ANTERIOR CERVICAL  DECOMP/DISCECTOMY FUSION N/A 11/05/2016   Procedure: Anterior Cervical Discectomy and Fusion - Cervical three-Cervical four;  Surgeon: Earnie Larsson, MD;  Location: [place];  Service: Neurosurgery;  Laterality: N/A;   BACK SURGERY     x3   BALLOON DILATION N/A 12/04/2019   Procedure: BALLOON DILATION;  Surgeon: Eloise Harman, DO;  [facility];  Service: Endoscopy;  Laterality: N/A;   BIOPSY N/A 05/30/2012   Procedure: BIOPSY;  Surgeon: Danie Binder, MD;  Location: [facility];  Service: Endoscopy;  Laterality: N/A;   BIOPSY  03/02/2016   Procedure: BIOPSY;  Surgeon: Danie Binder, MD;  [facility];  Service: Endoscopy;;  gastric   BIOPSY  12/04/2019   Procedure: BIOPSY;  Surgeon: Eloise Harman, DO;  [facility];  Service: Endoscopy;;   CARDIAC CATHETERIZATION  "several"   CARPAL TUNNEL RELEASE Bilateral    CATARACT EXTRACTION W/PHACO Right 06/15/2016   Procedure: CATARACT EXTRACTION PHACO AND INTRAOCULAR LENS PLACEMENT ([place]);  Surgeon: Rutherford Guys, MD;  Location: [facility];  Service: Ophthalmology;  Laterality: Right;  CDE: 4.64   CATARACT EXTRACTION W/PHACO Left 07/13/2016   Procedure: CATARACT EXTRACTION PHACO AND INTRAOCULAR LENS PLACEMENT (IOC);  Surgeon: Rutherford Guys, MD;  Location: [facility];  Service: Ophthalmology;  Laterality: Left;  CDE: 3.15   COLONOSCOPY  12/28/2010   SLF: (MAC)Internal hemorrhoids/four small colon polyps tubular adenomas. per SLF: colonoscopy 2022   COLONOSCOPY WITH PROPOFOL N/A 07/05/2017   Surgeon: Danie Binder, MD; tubular adenoma, hyperplastic polyp, submucosal nodule at hepatic flexure consistent with lipoma on biopsy, diverticulosis and rectosigmoid and sigmoid colon, rectal bleeding due to large internal hemorrhoids.  Recommended repeat in 3 years due to history of polyps and oily film on scope.   CORONARY ARTERY BYPASS GRAFT  2002   3 vessels   ESOPHAGOGASTRODUODENOSCOPY N/A 05/30/2012   SLF: UNCONTROLLED GERD DUE TO  LIFESTYLE CHOICE/WEIGHT GAIN/MILD Non-erosive gastritis   ESOPHAGOGASTRODUODENOSCOPY (EGD) WITH PROPOFOL N/A 03/02/2016   Dr. Oneida Alar: Esophagus appeared normal, impaired dilation performed, patchy inflammation with edema and erythema of the entire stomach., Biopsy with H pylori, patient completed Pylera.    ESOPHAGOGASTRODUODENOSCOPY (EGD) WITH PROPOFOL N/A 12/04/2019   Surgeon: Eloise Harman, DO; benign appearing esophageal stenosis dilated, gastritis biopsied (mild reactive gastropathy, gastritis, negative H. pylori), normal duodenum.   FRACTURE SURGERY     LEFT HEART CATHETERIZATION WITH CORONARY ANGIOGRAM N/A 08/31/2011   Procedure: LEFT HEART CATHETERIZATION WITH CORONARY ANGIOGRAM;  Surgeon: Laverda Page, MD;  Location: [facility] CATH LAB;  Service: Cardiovascular;  Laterality: N/A;   LUMBAR DISC SURGERY     "L4-5; Dr. Trenton Gammon"   LUMBAR LAMINECTOMY/DECOMPRESSION MICRODISCECTOMY Right 03/06/2019   Procedure: MICRODISCECTOMY EXTRAFORAMINAL LUMBAR THREE - LUMBAR FOUR RIGHT;  Surgeon: Earnie Larsson, MD;  Location: [facility]  OR;  Service: Neurosurgery;  Laterality: Right;  MICRODISCECTOMY EXTRAFORAMINAL LUMBAR THREE - LUMBAR FOUR RIGHT   MULTIPLE TOOTH EXTRACTIONS     ORIF MANDIBULAR FRACTURE N/A 12/19/2015   Procedure: OPEN REDUCTION INTERNAL FIXATION (ORIF) MANDIBULAR FRACTURE;  Surgeon: Izora Gala, MD;  Location: [place];  Service: ENT;  Laterality: N/A;   PATELLA FRACTURE SURGERY Left 1976   plate to knee cap from accident   POLYPECTOMY  07/05/2017   Procedure: POLYPECTOMY;  Surgeon: Danie Binder, MD;  [facility];  Service: Endoscopy;;  colon   SAVORY DILATION  12/28/2010   SLF:(MAC)J-shaped stomach/nodular mocosa in the distal esophagus/empiric dilation 39m   SAVORY DILATION N/A 03/02/2016   Procedure: SAVORY DILATION;  Surgeon: SDanie Binder MD;  [facility];  Service: Endoscopy;  Laterality: N/A;    Allergies  Allergies  Allergen Reactions   Gabapentin Hives    Ibuprofen Other (See Comments)    Stomach  Upset and stomach ulcers   Zolpidem Tartrate Other (See Comments)    Hallucinations    Naproxen Other (See Comments)    HALLUCINATIONS    History of Present Illness    Dalton FOHLis a 61y.o. male with a hx of *** last seen ***.   EKGs/Labs/Other Studies Reviewed:   The following studies were reviewed today: ***  EKG:  EKG is *** ordered today.  The ekg ordered today demonstrates ***  Recent Labs: 03/11/2021: TSH 1.81 08/25/2021: B Natriuretic Peptide 60.0 08/26/2021: ALT 24; BUN 11; Creatinine, Ser 0.86; Hemoglobin 12.8; Magnesium 1.7; Platelets 212; Potassium 3.5; Sodium 139  Recent Lipid Panel    Component Value Date/Time   CHOL 125 03/11/2021 0000   CHOL 131 08/14/2019 1238   TRIG 162 (H) 03/11/2021 0000   HDL 59 03/11/2021 0000   HDL 37 (L) 08/14/2019 1238   CHOLHDL 2.1 03/11/2021 0000   VLDL 69 (H) 12/16/2015 0630   LDLCALC 42 03/11/2021 0000    Risk Assessment/Calculations:  {Does this patient have ATRIAL FIBRILLATION?:[phone]}  Home Medications   No outpatient medications have been marked as taking for the 10/12/21 encounter (Appointment) with CElgie Collard PA-C.     Review of Systems   ***   All other systems reviewed and are otherwise negative except as noted above.  Physical Exam    VS:  There were no vitals taken for this visit. , BMI There is no height or weight on file to calculate BMI.  Wt Readings from Last 3 Encounters:  08/26/21 280 lb 11.2 oz (127.3 kg)  06/15/21 271 lb (122.9 kg)  05/25/21 245 lb (111.1 kg)     GEN: Well nourished, well developed, in no acute distress. HEENT: normal. Neck: [name], no JVD, carotid bruits, or masses. Cardiac: ***RRR, no murmurs, rubs, or gallops. No clubbing, cyanosis, edema.  ***Radials/PT 2+ and equal bilaterally.  Respiratory:  ***Respirations regular and unlabored, clear to auscultation bilaterally. GI: Soft, nontender, nondistended. MS: No  deformity or atrophy. Skin: Warm and dry, no rash. Neuro:  Strength and sensation are intact. Psych: Normal affect.  Assessment & Plan    Exertional shortness of breath with occasional chest tightness Hypertension Hyperlipidemia CAD status post CABG X 1 (LIMA to LAD) 2002  No BP recorded.  {Refresh Note OR Click here to enter BP  :1}***      Disposition: Follow up {follow up:[id number]} with HSinclair Grooms MD or APP.  Signed, TElgie Collard PA-C 10/09/2021, 12:48 PM [facility]

## 2021-10-12 ENCOUNTER — Ambulatory Visit (INDEPENDENT_AMBULATORY_CARE_PROVIDER_SITE_OTHER): Payer: Medicare Other | Admitting: Physician Assistant

## 2021-10-12 DIAGNOSIS — I10 Essential (primary) hypertension: Secondary | ICD-10-CM

## 2021-10-12 DIAGNOSIS — I25700 Atherosclerosis of coronary artery bypass graft(s), unspecified, with unstable angina pectoris: Secondary | ICD-10-CM

## 2021-10-12 DIAGNOSIS — R0609 Other forms of dyspnea: Secondary | ICD-10-CM

## 2021-10-12 DIAGNOSIS — E785 Hyperlipidemia, unspecified: Secondary | ICD-10-CM

## 2021-10-13 ENCOUNTER — Ambulatory Visit: Payer: Medicare Other | Admitting: Physician Assistant

## 2021-10-13 NOTE — Progress Notes (Unsigned)
Cardiology Office Note:    Date:  10/14/2021   ID:  Dalton [name], DOB 1960-10-29, MRN [id number]  PCP:  Nolene Ebbs, MD   [place] Providers Cardiologist:  Sinclair Grooms, MD     Referring MD: Nolene Ebbs, MD   Chief Complaint: shortness of breath  History of Present Illness:    Dalton [name] is a pleasant 61 y.o. 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   Echo 09/2019 LVEF 60-65%, G1DD. Coronary CT 09/2019 with calcium score of 0, no evidence of CAD. Seen 05/14/21 by Robbie Lis, PA for CP, SOB, and palpitations. Lexiscan myoview without ischemia or infarction, EF 56% 05/26/21 with continued angina. Cardiac monitor with sinus rhythm and nonsustained symptomatic SVT. Advised by Dr. Tamala Julian to undergo ETT for functional status on current medication but when he came in for test it could not be completed due to his inability to walk without a cane.   He was last seen in our office by Robbie Lis, [place] on 06/15/21.  Continue to have exertional shortness of breath with occasional chest tightness, felt need to stop and catch his breath. Felt to have microvascular angina by primary cardiologist, Dr. Tamala Julian at a visit on 01/20/21.  Admission 7/4-08/26/21 with mid-sternal chest pain radiating to left arm. Pain non-reproducable, no associated symptoms. Had removal of multiple prostate polyps 6/29. Hypokalemia 2.5 on bmet, BNP 60, troponin negative x 3. He was treated with Protonix. NST 05/2021 which was reassuring. Not believed that his symptoms were cardiac. No acute ischemic changes on telemetry discharged with cardiology follow-up. Has had several missed/rescheduled appointments.   Today, he is here for  evaluation of shortness of breath. Is accompanied by one of his sons. SOB with walking, occurs at various distances depending on the day. Has some SOB with sitting and lying. Feels better sleeping on his side. Occasional leg swelling, having orthopnea and PND also. Feels fatigued frequently. History of COPD, has not seen pulmonology recently. Walks with a cane. Tries to get extra steps by walking around his apartment building or around his son's auto shop and sometimes he is able to walk 1-2 blocks without difficulty, other times has to stop and rest after a short distance. Has trouble sleeping. Son reports he sometimes hears snoring, is unsure if patient stops breathing during sleep.   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[place])     last seizure in 2011;unknown etiology-pt sts heriditary (12/24/2015)   YTKPTWSF(681.2)    "a few times/week" (12/24/2015)   High cholesterol  HNP (herniated nucleus pulposus), cervical    Hypertension    Laceration of right hand 11/27/2010   Laceration of wrist 2007 BIL FOREARMS   MI (myocardial infarction) ([place])    7, last one was in 2011 (12/24/2015)   Neuromuscular disorder ([place])    legs/feet   Neuropathy    legs/feet   NSAID-induced gastric ulcer    "Ibuprofen"   PUD (peptic ulcer disease)    in 1990s, secondary to medication   Rheumatoid arthritis ([place])    "all over" (12/24/2015)   Shortness of breath    with exertion   Tonsillitis, chronic     Dr. Vicki Mallet in [place]   Type II diabetes mellitus [facility])    Wears glasses     Past Surgical History:  Procedure Laterality Date   ANTERIOR CERVICAL DECOMP/DISCECTOMY FUSION N/A 11/05/2016   Procedure: Anterior Cervical Discectomy and Fusion - Cervical three-Cervical four;  Surgeon: Earnie Larsson, MD;  Location: [facility];  Service: Neurosurgery;  Laterality: N/A;   BACK SURGERY     x3   BALLOON DILATION N/A 12/04/2019   Procedure: BALLOON DILATION;  Surgeon: Eloise Harman, DO;  [facility];  Service: Endoscopy;  Laterality: N/A;   BIOPSY N/A 05/30/2012   Procedure: BIOPSY;  Surgeon: Danie Binder, MD;  [facility];  Service: Endoscopy;  Laterality: N/A;   BIOPSY  03/02/2016   Procedure: BIOPSY;  Surgeon: Danie Binder, MD;  [facility];  Service: Endoscopy;;  gastric   BIOPSY  12/04/2019   Procedure: BIOPSY;  Surgeon: Eloise Harman, DO;  [facility];  Service: Endoscopy;;   CARDIAC CATHETERIZATION  "several"   CARPAL TUNNEL RELEASE Bilateral    CATARACT EXTRACTION W/PHACO Right 06/15/2016   Procedure: CATARACT EXTRACTION PHACO AND INTRAOCULAR LENS PLACEMENT ([place]);  Surgeon: Rutherford Guys, MD;  [facility];  Service: Ophthalmology;  Laterality: Right;  CDE: 4.64   CATARACT EXTRACTION W/PHACO Left 07/13/2016   Procedure: CATARACT EXTRACTION PHACO AND INTRAOCULAR LENS PLACEMENT ([facility]);  Surgeon: Rutherford Guys, MD;  [facility];  Service: Ophthalmology;  Laterality: Left;  CDE: 3.15   COLONOSCOPY  12/28/2010   SLF: (MAC)Internal hemorrhoids/four small colon polyps tubular adenomas. per SLF: colonoscopy 2022   COLONOSCOPY WITH PROPOFOL N/A 07/05/2017   Surgeon: Danie Binder, MD; tubular adenoma, hyperplastic polyp, submucosal nodule at hepatic flexure consistent with lipoma on biopsy, diverticulosis and rectosigmoid and sigmoid colon, rectal bleeding due to large internal hemorrhoids.  Recommended repeat in 3 years due to history of  polyps and oily film on scope.   CORONARY ARTERY BYPASS GRAFT  2002   3 vessels   ESOPHAGOGASTRODUODENOSCOPY N/A 05/30/2012   SLF: UNCONTROLLED GERD DUE TO LIFESTYLE CHOICE/WEIGHT GAIN/MILD Non-erosive gastritis   ESOPHAGOGASTRODUODENOSCOPY (EGD) WITH PROPOFOL N/A 03/02/2016   Dr. Oneida Alar: Esophagus appeared normal, impaired dilation performed, patchy inflammation with edema and erythema of the entire stomach., Biopsy with H pylori, patient completed [place]yl[place].    ESOPHAGOGASTRODUODENOSCOPY (EGD) WITH PROPOFOL N/A 12/04/2019   Surgeon: Eloise Harman, DO; benign appearing esophageal stenosis dilated, gastritis biopsied (mild reactive gastropathy, gastritis, negative H. pylori), normal duodenum.   FRACTURE SURGERY     LEFT HEART CATHETERIZATION WITH CORONARY ANGIOGRAM N/A 08/31/2011   Procedure: LEFT HEART CATHETERIZATION WITH CORONARY ANGIOGRAM;  Surgeon: Laverda Page, MD;  [facility];  Service: Cardiovascular;  Laterality: N/A;   LUMBAR DISC SURGERY     "L4-5; Dr. Trenton Gammon"   LUMBAR LAMINECTOMY/DECOMPRESSION MICRODISCECTOMY Right  03/06/2019   Procedure: MICRODISCECTOMY EXTRAFORAMINAL LUMBAR THREE - LUMBAR FOUR RIGHT;  Surgeon: Earnie Larsson, MD;  Location: [facility];  Service: Neurosurgery;  Laterality: Right;  MICRODISCECTOMY EXTRAFORAMINAL LUMBAR THREE - LUMBAR FOUR RIGHT   MULTIPLE TOOTH EXTRACTIONS     ORIF MANDIBULAR FRACTURE N/A 12/19/2015   Procedure: OPEN REDUCTION INTERNAL FIXATION (ORIF) MANDIBULAR FRACTURE;  Surgeon: Izora Gala, MD;  Location: [facility];  Service: ENT;  Laterality: N/A;   PATELLA FRACTURE SURGERY Left 1976   plate to knee cap from accident   POLYPECTOMY  07/05/2017   Procedure: POLYPECTOMY;  Surgeon: Danie Binder, MD;  [facility];  Service: Endoscopy;;  colon   SAVORY DILATION  12/28/2010   SLF:(MAC)J-shaped stomach/nodular mocosa in the distal esophagus/empiric dilation 16m   SAVORY DILATION N/A 03/02/2016   Procedure: SAVORY DILATION;   Surgeon: SDanie Binder MD;  [facility];  Service: Endoscopy;  Laterality: N/A;    Current Medications: Current Meds  Medication Sig   albuterol (PROVENTIL HFA;VENTOLIN HFA) 108 (90 Base) MCG/ACT inhaler Inhale 2 puffs into the lungs every 6 (six) hours as needed for wheezing or shortness of breath.    albuterol (PROVENTIL) (2.5 MG/3ML) 0.083% nebulizer solution Take 2.5 mg by nebulization every 6 (six) hours as needed for wheezing or shortness of breath.    ALPRAZolam (XANAX) 1 MG tablet Take 1 mg by mouth 3 (three) times daily.    amLODipine (NORVASC) 5 MG tablet TAKE 1 TABLET(5 MG) BY MOUTH DAILY   atorvastatin (LIPITOR) 80 MG tablet Take 80 mg by mouth at bedtime.   beclomethasone (QVAR) 80 MCG/ACT inhaler Inhale 2 puffs into the lungs 2 (two) times daily as needed (respiratory issues.).    BELSOMRA 15 MG TABS Take 1 tablet by mouth at bedtime.   brimonidine (ALPHAGAN P) 0.1 % SOLN Place 1 drop into the right eye 2 (two) times daily.   cetirizine (ZYRTEC) 10 MG tablet Take 10 mg by mouth daily as needed for allergies.    clopidogrel (PLAVIX) 75 MG tablet Take 75 mg by mouth daily.   dexlansoprazole (DEXILANT) 60 MG capsule Take 1 capsule (60 mg total) by mouth daily.   ezetimibe (ZETIA) 10 MG tablet Take 10 mg by mouth daily.   fluticasone (FLONASE) 50 MCG/ACT nasal spray Place 2 sprays into both nostrils daily.   Fluticasone Furoate (ARNUITY ELLIPTA) 200 MCG/ACT AEPB Inhale 1 puff into the lungs daily as needed (shortness of breath).    furosemide (LASIX) 40 MG tablet TAKE 1 TABLET(40 MG) BY MOUTH DAILY   glipiZIDE (GLUCOTROL) 10 MG tablet Take 10 mg by mouth daily before breakfast.   ipratropium (ATROVENT) 0.02 % nebulizer solution Take 0.5 mg by nebulization 2 (two) times daily as needed for wheezing or shortness of breath (for congestion).    ketoconazole (NIZORAL) 2 % shampoo Apply 1 application topically daily as needed for irritation.    LINZESS 290 MCG CAPS capsule  TAKE 1 CAPSULE(290 MCG) BY MOUTH DAILY BEFORE BREAKFAST   lisinopril-hydrochlorothiazide (ZESTORETIC) 10-12.5 MG tablet Take 1 tablet by mouth daily.   Liver Extract (LIVER PO) Take 1 tablet by mouth daily. "Liver Aid"   magnesium citrate SOLN Take 1 Bottle by mouth as needed for moderate constipation.    metFORMIN (GLUCOPHAGE-XR) 500 MG 24 hr tablet Take 500 mg by mouth daily with breakfast.    methocarbamol (ROBAXIN) 500 MG tablet Take 500 mg by mouth every 6 (six) hours as needed.   metoprolol tartrate (LOPRESSOR) 50  MG tablet TAKE 1 TABLET(50 MG) BY MOUTH TWICE DAILY   Multiple Vitamin (MULTIVITAMIN WITH MINERALS) TABS tablet Take 1 tablet by mouth daily.   naloxegol oxalate (MOVANTIK) 25 MG TABS tablet Take 1 tablet (25 mg total) by mouth daily.   NARCAN 4 MG/0.1ML LIQD nasal spray kit Place 1 spray into the nose as needed (opioid overdose).    nitroGLYCERIN (NITROSTAT) 0.4 MG SL tablet Place 1 tablet (0.4 mg total) under the tongue every 5 (five) minutes as needed for chest pain.   oxyCODONE-acetaminophen (PERCOCET) 10-325 MG tablet Take 1 tablet by mouth every 4 (four) hours as needed for pain.   potassium chloride SA (KLOR-CON) 20 MEQ tablet Take 20 mEq by mouth 2 (two) times daily.   predniSONE (DELTASONE) 10 MG tablet Take 40 mg by mouth daily.   REPATHA SURECLICK 203 MG/ML SOAJ ADMINISTER 1 ML UNDER THE SKIN EVERY 14 DAYS   sertraline (ZOLOFT) 100 MG tablet Take 100 mg by mouth daily.   sitaGLIPtin (JANUVIA) 100 MG tablet Take 100 mg by mouth daily.   sucralfate (CARAFATE) 1 GM/10ML suspension in the morning, at noon, in the evening, and at bedtime.   tamsulosin (FLOMAX) 0.4 MG CAPS capsule Take 1 capsule (0.4 mg total) by mouth daily after breakfast.   Vitamin D, Ergocalciferol, (DRISDOL) 1.25 MG (50000 UT) CAPS capsule Take 50,000 Units by mouth every 7 (seven) days. Monday     Allergies:   Gabapentin, Ibuprofen, Zolpidem tartrate, and Naproxen   Social History   Socioeconomic  History   Marital status: Single    Spouse name: Not on file   Number of children: 2   Years of education: Not on file   Highest education level: Not on file  Occupational History   Occupation: disabled    Employer: NOT EMPLOYED  Tobacco Use   Smoking status: Never   Smokeless tobacco: Never  Vaping Use   Vaping Use: Never used  Substance and Sexual Activity   Alcohol use: No    Alcohol/week: 0.0 standard drinks of alcohol   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)   Sexual activity: Not Currently  Other Topics Concern   Not on file  Social History Narrative   Lives w/ son-23/22   Social Determinants of Health   Financial Resource Strain: Not on file  Food Insecurity: Not on file  Transportation Needs: Not on file  Physical Activity: Not on file  Stress: Not on file  Social Connections: Not on file     Family History: The patient's family history includes Diabetes in his father and mother; Heart attack in his brother, sister, and another family member; Heart attack (age of onset: 1) in his father; Heart attack (age of onset: 65) in his mother; Heart failure in an other family member; Hypertension in his father and mother; Seizures in his brother. There is no history of Colon cancer, Liver disease, Anesthesia problems, Hypotension, Malignant hyperthermia, Pseudochol deficiency, or Colon polyps.  ROS:   Please see the history of present illness.    + DOE All other systems reviewed and are negative.  Labs/Other Studies Reviewed:    The following studies were reviewed today:  Lexiscan myoview 05/26/21    The study is normal. The study is low risk.   No ST deviation was noted.   LV perfusion is normal. There is no evidence of ischemia. There is no evidence of  infarction.   Left ventricular function is normal. Nuclear stress EF: 56 %. The left ventricular ejection fraction is normal (55-65%). End  diastolic cavity size is mildly enlarged. End systolic cavity size is mildly enlarged.   Prior study available for comparison from 11/01/2018.   Normal resting and stress perfusion. No ischemia or infarction EF 56%  Cardiac monitor 05/29/21  Normal sinus rhythm Frequent, high burden (25%), symptomatic non-sustained SVT Longest SVT run lasted 25 seconds at 111 bpm. Fastest SVT > 200 bpm.  CCTA 10/18/19  1. Coronary calcium score of 0. This was 0 percentile for age and sex matched control.   2. Normal coronary origin with right dominance.   3. No evidence of CAD.  Echo 10/18/19   1. Left ventricular ejection fraction, by estimation, is 60 to 65%. The  left ventricle has normal function. The left ventricle has no regional  wall motion abnormalities. There is moderate left ventricular hypertrophy.  Left ventricular diastolic  parameters are consistent with Grade I diastolic dysfunction (impaired  relaxation).   2. Right ventricular systolic function is normal. The right ventricular  size is normal. There is normal pulmonary artery systolic pressure.   3. The mitral valve is abnormal. Trivial mitral valve regurgitation.   4. The aortic valve is tricuspid. Aortic valve regurgitation is not  visualized.   5. The inferior vena cava is normal in size with greater than 50%  respiratory variability, suggesting right atrial pressure of 3 mmHg.   Comparison(s): Changes from prior study are noted. 06/30/2017: LVEF 60-65%,  mild LVH.   Recent Labs: 03/11/2021: TSH 1.81 08/25/2021: B Natriuretic Peptide 60.0 08/26/2021: ALT 24; BUN 11; Creatinine, Ser 0.86; Hemoglobin 12.8; Magnesium 1.7; Platelets 212; Potassium 3.5; Sodium 139  Recent Lipid Panel    Component Value Date/Time   CHOL 125 03/11/2021 0000   CHOL 131 08/14/2019 1238   TRIG 162 (H) 03/11/2021 0000   HDL 59 03/11/2021 0000   HDL 37 (L) 08/14/2019 1238   CHOLHDL 2.1 03/11/2021 0000   VLDL 69 (H) 12/16/2015 0630   LDLCALC 42  03/11/2021 0000     Risk Assessment/Calculations:      STOP-Bang Score:  7       Physical Exam:    VS:  BP 138/62   Pulse 82   Ht _0  (1.778 m)   Wt 280 lb 9.6 oz (127.3 kg)   SpO2 98%   BMI 40.26 kg/m     Wt Readings from Last 3 Encounters:  10/14/21 280 lb 9.6 oz (127.3 kg)  08/26/21 280 lb 11.2 oz (127.3 kg)  06/15/21 271 lb (122.9 kg)     GEN:  Well developed, obese male in no acute distress HEENT: Normal NECK: No JVD; No carotid bruits CARDIAC: Irregular RR, no murmurs, rubs, gallops RESPIRATORY:  Diminished breath sounds throughout lung fields upon auscultation without rales, wheezing or rhonchi  ABDOMEN: Soft, non-tender, non-distended MUSCULOSKELETAL:  No edema; No deformity. 2+ pedal pulses, equal bilaterally SKIN: Warm and dry NEUROLOGIC:  Alert and oriented x 3 PSYCHIATRIC:  Normal affect   EKG:  EKG is ordered today.  The ekg ordered today demonstrates sinus rhythm at 76 bpm with frequent PACs, nonspecific T wave abnormality  Diagnoses:    1. DOE (dyspnea on exertion)   2. Daytime somnolence   3. Coronary artery disease involving coronary bypass graft of native heart with unstable angina pectoris ([place])   4. Essential hypertension   5. Hyperlipidemia LDL goal <70   6.  Chronic heart failure with preserved ejection fraction (HCC)   7. Morbid obesity ([place])    Assessment and Plan:     CAD s/p CABG (LIMA to LAD 2002) without angina: History of CABG x1.  Coronary CTA 09/2019 revealed calcium score of 0, atretic LIMA, large D1 without plaque.  Normal coronary origin with right dominance, no evidence of CAD.  Normal nuclear stress test 05/26/2021. He denies chest pain. Is having DOE. Would suspect DOE likely not angina equivalent as noted below. Will get echo for evaluation of wall motion abnormality. Recommend follow-up with pulmonology for COPD.   DOE: Normal LVEF, G1DD on echo 09/2019. Likely multifactorial 2/2 history of COPD, obesity, deconditioning.  Encouraged weight loss. Will update echo for evaluation of worsening LV or RV function, valve disease. Additionally, recommend that he follow-up with pulmonology.   Daytime somnolence: Frequently fatigued.  Admits he does not sleep well.  Son says that he snores. Stop bang score is 7.  We will get a home sleep study.  If not approved by insurance, would recommend in-lab sleep test.   Hypertension: BP is well-controlled. He does not monitor on a consistent basis. No medication changes today.   PACs: Noted on EKG. Asymptomatic. Will assess LV function on upcoming echo.   Hyperlipidemia LDL goal < 70: LDL 42 on 03/11/21. Continue atorvastatin.   Obesity: Likely contributing to dyspnea on exertion.  Encouraged that weight loss could significantly improve his symptoms.  Information on Mediterranean style diet given to patient.    Disposition: 3 months with Dr. Tamala Julian or APP  Medication Adjustments/Labs and Tests Ordered: Current medicines are reviewed at length with the patient today.  Concerns regarding medicines are outlined above.  Orders Placed This Encounter  Procedures   EKG 12-Lead   ECHOCARDIOGRAM COMPLETE   [place] Sleep Study   No orders of the defined types were placed in this encounter.   Patient Instructions  Medication Instructions:   Your physician recommends that you continue on your current medications as directed. Please refer to the Current Medication list given to you today.    *If you need a refill on your cardiac medications before your next appointment, please call your pharmacy*   Lab Work:  None ordered.  If you have labs (blood work) drawn today and your tests are completely normal, you will receive your results only by: [place] (if you have MyChart) OR A paper copy in the mail If you have any lab test that is abnormal or we need to change your treatment, we will call you to review the results.   Testing/Procedures:  Your physician has  requested that you have an echocardiogram. Echocardiography is a painless test that uses sound waves to create images of your heart. It provides your doctor with information about the size and shape of your heart and how well your heart's chambers and valves are working. This procedure takes approximately one hour. There are no restrictions for this procedure.    Follow-Up: At [facility], you and your health needs are our priority.  As part of our continuing mission to provide you with exceptional heart care, we have created designated Provider Care Teams.  These Care Teams include your primary Cardiologist (physician) and Advanced Practice Providers (APPs -  Physician Assistants and Nurse Practitioners) who all work together to provide you with the care you need, when you need it.  We recommend signing up for the patient portal called "MyChart".  Sign up information is provided on  this After Visit Summary.  MyChart is used to connect with patients for Virtual Visits (Telemedicine).  Patients are able to view lab/test results, encounter notes, upcoming appointments, etc.  Non-urgent messages can be sent to your provider as well.   To learn more about what you can do with MyChart, go to [email or web address].    Your next appointment:   3 month(s)  The format for your next appointment:   In Person  Provider:   Sinclair Grooms, MD    Adopting a Healthy Lifestyle.   Weight: Know what a healthy weight is for you (roughly BMI <25) and aim to maintain this. You can calculate your body mass index on your smart phone  Diet: Aim for 7+ servings of fruits and vegetables daily Limit animal fats in diet for cholesterol and heart health - choose grass fed whenever available Avoid highly processed foods (fast food burgers, tacos, fried chicken, pizza, hot dogs, french fries)  Saturated fat comes in the form of butter, lard, coconut oil, margarine, partially hydrogenated oils, and fat in meat.  These increase your risk of cardiovascular disease.  Use healthy plant oils, such as olive, canola, soy, corn, sunflower and peanut.  Whole foods such as fruits, vegetables and whole grains have fiber  Men need > 38 grams of fiber per day Women need > 25 grams of fiber per day  Load up on vegetables and fruits - one-half of your plate: Aim for color and variety, and remember that potatoes dont count. Go for whole grains - one-quarter of your plate: Whole wheat, barley, wheat berries, quinoa, oats, brown rice, and foods made with them. If you want pasta, go with whole wheat pasta. Protein power - one-quarter of your plate: Fish, chicken, beans, and nuts are all healthy, versatile protein sources. Limit red meat. You need carbohydrates for energy! The type of carbohydrate is more important than the amount. Choose carbohydrates such as vegetables, fruits, whole grains, beans, and nuts in the place of white rice, white pasta, potatoes (baked or fried), macaroni and cheese, cakes, cookies, and donuts.  If youre thirsty, drink water. Coffee and tea are good in moderation, but skip sugary drinks and limit milk and dairy products to one or two daily servings. Keep sugar intake at 6 teaspoons or 24 grams or LESS       Exercise: Aim for 150 min of moderate intensity exercise weekly for heart health, and weights twice weekly for bone health Stay active - any steps are better than no steps! Aim for 7-9 hours of sleep daily       Important Information About Sugar         Signed, Emmaline Life, NP  10/14/2021 11:56 AM    [place]

## 2021-10-14 ENCOUNTER — Telehealth: Payer: Self-pay | Admitting: *Deleted

## 2021-10-14 ENCOUNTER — Ambulatory Visit (INDEPENDENT_AMBULATORY_CARE_PROVIDER_SITE_OTHER): Payer: Medicare Other | Admitting: Nurse Practitioner

## 2021-10-14 ENCOUNTER — Encounter: Payer: Self-pay | Admitting: Nurse Practitioner

## 2021-10-14 VITALS — BP 138/62 | HR 82 | Ht 70.0 in | Wt 280.6 lb

## 2021-10-14 DIAGNOSIS — I10 Essential (primary) hypertension: Secondary | ICD-10-CM

## 2021-10-14 DIAGNOSIS — I25700 Atherosclerosis of coronary artery bypass graft(s), unspecified, with unstable angina pectoris: Secondary | ICD-10-CM | POA: Diagnosis not present

## 2021-10-14 DIAGNOSIS — E785 Hyperlipidemia, unspecified: Secondary | ICD-10-CM

## 2021-10-14 DIAGNOSIS — R400 Somnolence: Secondary | ICD-10-CM

## 2021-10-14 DIAGNOSIS — I5032 Chronic diastolic (congestive) heart failure: Secondary | ICD-10-CM

## 2021-10-14 DIAGNOSIS — R0609 Other forms of dyspnea: Secondary | ICD-10-CM | POA: Diagnosis not present

## 2021-10-14 NOTE — Patient Instructions (Signed)
Medication Instructions:   Your physician recommends that you continue on your current medications as directed. Please refer to the Current Medication list given to you today.    *If you need a refill on your cardiac medications before your next appointment, please call your pharmacy*   Lab Work:  None ordered.  If you have labs (blood work) drawn today and your tests are completely normal, you will receive your results only by: Dalton [name] (if you have MyChart) OR A paper copy in the mail If you have any lab test that is abnormal or we need to change your treatment, we will call you to review the results.   Testing/Procedures:  Your physician has requested that you have an echocardiogram. Echocardiography is a painless test that uses sound waves to create images of your heart. It provides your doctor with information about the size and shape of your heart and how well your heart's chambers and valves are working. This procedure takes approximately one hour. There are no restrictions for this procedure.    Follow-Up: At [facility], you and your health needs are our priority.  As part of our continuing mission to provide you with exceptional heart care, we have created designated Provider Care Teams.  These Care Teams include your primary Cardiologist (physician) and Advanced Practice Providers (APPs -  Physician Assistants and Nurse Practitioners) who all work together to provide you with the care you need, when you need it.  We recommend signing up for the patient portal called "MyChart".  Sign up information is provided on this After Visit Summary.  MyChart is used to connect with patients for Virtual Visits (Telemedicine).  Patients are able to view lab/test results, encounter notes, upcoming appointments, etc.  Non-urgent messages can be sent to your provider as well.   To learn more about what you can do with MyChart, go to [email or web address].    Your next  appointment:   3 month(s)  The format for your next appointment:   In Person  Provider:   Sinclair Grooms, MD    Adopting a Healthy Lifestyle.   Weight: Know what a healthy weight is for you (roughly BMI <25) and aim to maintain this. You can calculate your body mass index on your smart phone  Diet: Aim for 7+ servings of fruits and vegetables daily Limit animal fats in diet for cholesterol and heart health - choose grass fed whenever available Avoid highly processed foods (fast food burgers, tacos, fried chicken, pizza, hot dogs, french fries)  Saturated fat comes in the form of butter, lard, coconut oil, margarine, partially hydrogenated oils, and fat in meat. These increase your risk of cardiovascular disease.  Use healthy plant oils, such as olive, canola, soy, corn, sunflower and peanut.  Whole foods such as fruits, vegetables and whole grains have fiber  Men need > 38 grams of fiber per day Women need > 25 grams of fiber per day  Load up on vegetables and fruits - one-half of your plate: Aim for color and variety, and remember that potatoes dont count. Go for whole grains - one-quarter of your plate: Whole wheat, barley, wheat berries, quinoa, oats, brown rice, and foods made with them. If you want pasta, go with whole wheat pasta. Protein power - one-quarter of your plate: Fish, chicken, beans, and nuts are all healthy, versatile protein sources. Limit red meat. You need carbohydrates for energy! The type of carbohydrate is more important than the amount. Choose carbohydrates such  as vegetables, fruits, whole grains, beans, and nuts in the place of white rice, white pasta, potatoes (baked or fried), macaroni and cheese, cakes, cookies, and donuts.  If youre thirsty, drink water. Coffee and tea are good in moderation, but skip sugary drinks and limit milk and dairy products to one or two daily servings. Keep sugar intake at 6 teaspoons or 24 grams or LESS       Exercise: Aim  for 150 min of moderate intensity exercise weekly for heart health, and weights twice weekly for bone health Stay active - any steps are better than no steps! Aim for 7-9 hours of sleep daily       Important Information About Sugar

## 2021-10-14 NOTE — Telephone Encounter (Signed)
Pt was seen in the office today by Christen Bame, NP who ordered an Itamar study. Pt agreeable to signed waiver and to not open the box until he has been called with PIN#.

## 2021-10-15 ENCOUNTER — Encounter: Payer: Self-pay | Admitting: Physician Assistant

## 2021-10-16 ENCOUNTER — Encounter (HOSPITAL_BASED_OUTPATIENT_CLINIC_OR_DEPARTMENT_OTHER): Payer: Medicare Other | Admitting: Cardiology

## 2021-10-16 DIAGNOSIS — G4733 Obstructive sleep apnea (adult) (pediatric): Secondary | ICD-10-CM | POA: Diagnosis not present

## 2021-10-16 NOTE — Telephone Encounter (Signed)
Prior Authorization for [facility] sent to [facility] via Fax .   NO PA REQ

## 2021-10-16 NOTE — Telephone Encounter (Signed)
Pt aware ok to proceed with Itamar study.   Called and made the patient aware that he may proceed with the [facility] Sleep Study. PIN # provided to the patient. Patient made aware that he will be contacted after the test has been read with the results and any recommendations. Patient verbalized understanding and thanked me for the call.   PIN # [id number] has been given to the pt.

## 2021-10-20 NOTE — Procedures (Signed)
   SLEEP STUDY REPORT Patient Information Study Date: 10/16/21 Patient Name: Dalton [name] Patient ID: [id number] Birth Date: August 01, 2060 Age: 61 Gender: Male BMI: 40.1 (W=280 lb, H=5' 10'') Stopbang: 7 Referring Physician: Christen Bame, NP  TEST DESCRIPTION: Home sleep apnea testing was completed using the WatchPat, a Type 1 device, utilizing  peripheral arterial tonometry (PAT), chest movement, actigraphy, pulse oximetry, pulse rate, body position and snore.  AHI was calculated with apnea and hypopnea using valid sleep time as the denominator. RDI includes apneas,  hypopneas, and RERAs. The data acquired and the scoring of sleep and all associated events were performed in  accordance with the recommended standards and specifications as outlined in the AASM Manual for the Scoring of  Sleep and Associated Events 2.2.0 (2015).   FINDINGS:  1. Mild Obstructive Sleep Apnea with AHI 5.3/hr.  2. No Central Sleep Apnea with pAHIc 0.3/hr.  3. Oxygen desaturations as low as 90%.  4. Moderate snoring was present. O2 sats were < 88% for 0 min.  5. Total sleep time was 6 hrs and 45 min.  6. 3.4% of total sleep time was spent in REM sleep.  7. Shortened sleep onset latency at 6 min.  8. Prolonged REM sleep onset latency at 106 min.  9. Total awakenings were 20.   DIAGNOSIS: Mild Obstructive Sleep Apnea (G47.33)  RECOMMENDATIONS: 1. Clinical correlation of these findings is necessary. The decision to treat obstructive sleep apnea (OSA) is usually  based on the presence of apnea symptoms or the presence of associated medical conditions such as Hypertension,  Congestive Heart Failure, Atrial Fibrillation or Obesity. The most common symptoms of OSA are snoring, gasping for  breath while sleeping, daytime sleepiness and fatigue.   2. Initiating apnea therapy is recommended given the presence of symptoms and/or associated conditions.  Recommend proceeding with one of the following:   a.  Auto-CPAP therapy with a pressure range of 5-20cm H2O.   b. An oral appliance (OA) that can be obtained from certain dentists with expertise in sleep medicine. These are  primarily of use in non-obese patients with mild and moderate disease.   c. An ENT consultation which may be useful to look for specific causes of obstruction and possible treatment  options.   d. If patient is intolerant to PAP therapy, consider referral to ENT for evaluation for hypoglossal nerve stimulator.   3. Close follow-up is necessary to ensure success with CPAP or oral appliance therapy for maximum benefit .  4. A follow-up oximetry study on CPAP is recommended to assess the adequacy of therapy and determine the need  for supplemental oxygen or the potential need for Bi-level therapy. An arterial blood gas to determine the adequacy of  baseline ventilation and oxygenation should also be considered.  5. Healthy sleep recommendations include: adequate nightly sleep (normal 7-9 hrs/night), avoidance of caffeine after  noon and alcohol near bedtime, and maintaining a sleep environment that is cool, dark and quiet.  6. Weight loss for overweight patients is recommended. Even modest amounts of weight loss can significantly  improve the severity of sleep apnea.  7. Snoring recommendations include: weight loss where appropriate, side sleeping, and avoidance of alcohol before  bed.  8. Operation of motor vehicle should be avoided when sleepy.  Signature: Fransico Him, MD; [facility]; [place], [place] Board of Sleep  Medicine Electronically Signed: 10/20/21

## 2021-10-21 ENCOUNTER — Ambulatory Visit: Payer: Medicare Other | Attending: Nurse Practitioner

## 2021-10-21 DIAGNOSIS — R400 Somnolence: Secondary | ICD-10-CM

## 2021-10-22 ENCOUNTER — Telehealth: Payer: Self-pay | Admitting: *Deleted

## 2021-10-22 NOTE — Telephone Encounter (Signed)
-----   Message from Lauralee Evener, [place] sent at 10/20/2021  4:08 PM EDT -----  ----- Message ----- From: Sueanne Margarita, MD Sent: 10/20/2021  10:56 AM EDT To: [facility] Sleep Studies  Please let patient know that they have sleep apnea and recommend treating with CPAP.  Please order an auto CPAP from 4-15cm H2O with heated humidity and mask of choice.    Followup with me in 6 weeks.

## 2021-10-22 NOTE — Telephone Encounter (Addendum)
The patient has been notified of the result and verbalized understanding.  All questions (if any) were answered. Dalton [name], [place] 10/22/2021 5:18 PM    Upon patient request DME selection is [facility].. Patient understands he will be contacted by [facility] to set up his cpap. Patient understands to call if [facility] does not contact him with new setup in a timely manner. Patient understands they will be called once confirmation has been received from [employer]/ that they have received their new machine to schedule 10 week follow up appointment.   [facility] notified of new cpap order  Please add to airview Patient was grateful for the call and thanked me.

## 2021-10-27 ENCOUNTER — Encounter (HOSPITAL_COMMUNITY): Payer: Self-pay

## 2021-10-27 ENCOUNTER — Other Ambulatory Visit (HOSPITAL_COMMUNITY): Payer: Medicare Other

## 2021-11-10 ENCOUNTER — Ambulatory Visit (HOSPITAL_COMMUNITY): Payer: Medicare Other | Attending: Nurse Practitioner

## 2021-11-10 DIAGNOSIS — R0609 Other forms of dyspnea: Secondary | ICD-10-CM | POA: Diagnosis present

## 2021-11-10 DIAGNOSIS — I10 Essential (primary) hypertension: Secondary | ICD-10-CM

## 2021-11-10 DIAGNOSIS — R400 Somnolence: Secondary | ICD-10-CM

## 2021-11-10 DIAGNOSIS — E785 Hyperlipidemia, unspecified: Secondary | ICD-10-CM | POA: Diagnosis not present

## 2021-11-10 DIAGNOSIS — I25700 Atherosclerosis of coronary artery bypass graft(s), unspecified, with unstable angina pectoris: Secondary | ICD-10-CM | POA: Diagnosis not present

## 2021-11-10 DIAGNOSIS — I5032 Chronic diastolic (congestive) heart failure: Secondary | ICD-10-CM

## 2021-11-10 LAB — ECHOCARDIOGRAM COMPLETE
Area-P 1/2: 3.88 cm2
S' Lateral: 2.9 cm

## 2021-11-24 ENCOUNTER — Other Ambulatory Visit: Payer: Self-pay

## 2021-11-24 ENCOUNTER — Emergency Department (HOSPITAL_COMMUNITY)
Admission: EM | Admit: 2021-11-24 | Discharge: 2021-11-24 | Disposition: A | Discharge status: Home / Self Care | Payer: Medicare Other | Attending: Emergency Medicine | Admitting: Emergency Medicine

## 2021-11-24 ENCOUNTER — Emergency Department (HOSPITAL_COMMUNITY): Payer: Medicare Other

## 2021-11-24 ENCOUNTER — Encounter (HOSPITAL_COMMUNITY): Payer: Self-pay | Admitting: Emergency Medicine

## 2021-11-24 DIAGNOSIS — M5431 Sciatica, right side: Secondary | ICD-10-CM | POA: Insufficient documentation

## 2021-11-24 DIAGNOSIS — I509 Heart failure, unspecified: Secondary | ICD-10-CM | POA: Insufficient documentation

## 2021-11-24 DIAGNOSIS — M25551 Pain in right hip: Secondary | ICD-10-CM

## 2021-11-24 MED ORDER — OXYCODONE-ACETAMINOPHEN 5-325 MG PO TABS
1.0000 | ORAL_TABLET | Freq: Once | ORAL | Status: AC
  Administered 2021-11-24: 1 via ORAL
  Filled 2021-11-24: qty 1

## 2021-11-24 MED ORDER — LIDOCAINE 5 % EX PTCH
1.0000 | MEDICATED_PATCH | CUTANEOUS | Status: DC
  Administered 2021-11-24: 1 via TRANSDERMAL
  Filled 2021-11-24: qty 1

## 2021-11-24 MED ORDER — METHOCARBAMOL 500 MG PO TABS: 500.0000 mg | ORAL_TABLET | Freq: Four times a day (QID) | ORAL | 0 refills | Status: DC | PRN

## 2021-11-24 NOTE — ED Triage Notes (Signed)
Pt presents with right hip pain radiating down right leg, x 3 weeks.

## 2021-11-24 NOTE — ED Provider Notes (Signed)
[facility] EMERGENCY DEPARTMENT Provider Note   CSN: [id number] Arrival date & time: 11/24/21  1219     History Chief Complaint  Patient presents with   Hip Pain    Dalton [name] is a 61 y.o. male with history of chronic back pain, CHF, bipolar, PACs, arthritis presents the emergency department for evaluation of right hip pain for the past 3 months.  Patient reports it starts in his lower right back/upper right buttock and radiates down his buttock into the posterior aspect of his leg.  He denies any history of IV drug use, fevers, urinary incontinence, fecal incontinence, or saddle anesthesia.  Patient reports that he is ambulatory with a cane which is at his baseline.  He has tried [employer] and creams with minimal relief of pain.  He is history of multiple back surgeries, has not followed up with his back specialist for this.  He denies any numbness or tingling.   Hip Pain Pertinent negatives include no chest pain and no shortness of breath.       Home Medications Prior to Admission medications   Medication Sig Start Date End Date Taking? Authorizing Provider  albuterol (PROVENTIL HFA;VENTOLIN HFA) 108 (90 Base) MCG/ACT inhaler Inhale 2 puffs into the lungs every 6 (six) hours as needed for wheezing or shortness of breath.     [provider]  albuterol (PROVENTIL) (2.5 MG/3ML) 0.083% nebulizer solution Take 2.5 mg by nebulization every 6 (six) hours as needed for wheezing or shortness of breath.     [provider]  ALPRAZolam Duanne Moron) 1 MG tablet Take 1 mg by mouth 3 (three) times daily.  08/01/19   [provider]  amLODipine (NORVASC) 5 MG tablet TAKE 1 TABLET(5 MG) BY MOUTH DAILY 04/07/20   Arnoldo Lenis, MD  atorvastatin (LIPITOR) 80 MG tablet Take 80 mg by mouth at bedtime. 12/05/20   [provider]  beclomethasone (QVAR) 80 MCG/ACT inhaler Inhale 2 puffs into the lungs 2 (two) times daily as needed (respiratory issues.).      [provider]  BELSOMRA 15 MG TABS Take 1 tablet by mouth at bedtime. 04/30/21   [provider]  brimonidine (ALPHAGAN P) 0.1 % SOLN Place 1 drop into the right eye 2 (two) times daily. 12/25/20   [provider]  cetirizine (ZYRTEC) 10 MG tablet Take 10 mg by mouth daily as needed for allergies.  01/26/16   [provider]  clopidogrel (PLAVIX) 75 MG tablet Take 75 mg by mouth daily.    [provider]  dexlansoprazole (DEXILANT) 60 MG capsule Take 1 capsule (60 mg total) by mouth daily. 07/05/17   Fields, Marga Melnick, MD  ezetimibe (ZETIA) 10 MG tablet Take 10 mg by mouth daily.    [provider]  fluticasone (FLONASE) 50 MCG/ACT nasal spray Place 2 sprays into both nostrils daily.    [provider]  Fluticasone Furoate (ARNUITY ELLIPTA) 200 MCG/ACT AEPB Inhale 1 puff into the lungs daily as needed (shortness of breath).     [provider]  furosemide (LASIX) 40 MG tablet TAKE 1 TABLET(40 MG) BY MOUTH DAILY 03/11/21   Belva Crome, MD  glipiZIDE (GLUCOTROL) 10 MG tablet Take 10 mg by mouth daily before breakfast.    [provider]  ipratropium (ATROVENT) 0.02 % nebulizer solution Take 0.5 mg by nebulization 2 (two) times daily as needed for wheezing or shortness of breath (for congestion).     [provider]  ketoconazole (NIZORAL) 2 % shampoo Apply 1 application topically daily as needed for irritation.  10/23/19   [provider]  LINZESS 290 MCG CAPS capsule TAKE 1 CAPSULE(290 MCG) BY MOUTH DAILY BEFORE BREAKFAST 02/04/21   Mahala Menghini, PA-C  lisinopril-hydrochlorothiazide (ZESTORETIC) 10-12.5 MG tablet Take 1 tablet by mouth daily. 03/11/21   [provider]  Liver Extract (LIVER PO) Take 1 tablet by mouth daily. "Liver Aid"    [provider]  magnesium citrate SOLN Take 1 Bottle by mouth as needed for moderate constipation.     [provider]  metFORMIN  (GLUCOPHAGE-XR) 500 MG 24 hr tablet Take 500 mg by mouth daily with breakfast.     [provider]  methocarbamol (ROBAXIN) 500 MG tablet Take 500 mg by mouth every 6 (six) hours as needed. 11/13/20   [provider]  metoprolol tartrate (LOPRESSOR) 50 MG tablet TAKE 1 TABLET(50 MG) BY MOUTH TWICE DAILY 06/09/21   Belva Crome, MD  Multiple Vitamin (MULTIVITAMIN WITH MINERALS) TABS tablet Take 1 tablet by mouth daily.    [provider]  naloxegol oxalate (MOVANTIK) 25 MG TABS tablet Take 1 tablet (25 mg total) by mouth daily. 08/21/19   Carlis Stable, NP  NARCAN 4 MG/0.1ML LIQD nasal spray kit Place 1 spray into the nose as needed (opioid overdose).  06/15/17   [provider]  nitroGLYCERIN (NITROSTAT) 0.4 MG SL tablet Place 1 tablet (0.4 mg total) under the tongue every 5 (five) minutes as needed for chest pain. 08/26/21   Johnson, Clanford L, MD  oxyCODONE-acetaminophen (PERCOCET) 10-325 MG tablet Take 1 tablet by mouth every 4 (four) hours as needed for pain.    [provider]  potassium chloride SA (KLOR-CON) 20 MEQ tablet Take 20 mEq by mouth 2 (two) times daily.    [provider]  predniSONE (DELTASONE) 10 MG tablet Take 40 mg by mouth daily. 04/24/21   [provider]  REPATHA SURECLICK 035 MG/ML SOAJ ADMINISTER 1 ML UNDER THE SKIN EVERY 14 DAYS 03/31/21   Arnoldo Lenis, MD  sertraline (ZOLOFT) 100 MG tablet Take 100 mg by mouth daily.    [provider]  sitaGLIPtin (JANUVIA) 100 MG tablet Take 100 mg by mouth daily.    [provider]  sucralfate (CARAFATE) 1 GM/10ML suspension in the morning, at noon, in the evening, and at bedtime. 09/10/20   [provider]  tamsulosin (FLOMAX) 0.4 MG CAPS capsule Take 1 capsule (0.4 mg total) by mouth daily after breakfast. 01/07/16   Kerrie Buffalo, NP  Vitamin D, Ergocalciferol, (DRISDOL) 1.25 MG (50000 UT) CAPS capsule Take 50,000 Units by mouth every 7 (seven)  days. Monday    [provider]      Allergies    Gabapentin, Ibuprofen, Zolpidem tartrate, and Naproxen    Review of Systems   Review of Systems  Constitutional:  Negative for chills and fever.  Respiratory:  Negative for shortness of breath.   Cardiovascular:  Negative for chest pain.  Musculoskeletal:  Positive for arthralgias and back pain.  See HPI  Physical Exam Updated Vital Signs BP (!) 150/83   Pulse 62   Temp 98.3 F (36.8 C) (Oral)   Resp 20   Ht $R'5\' 10"'po$  (1.778 m)   Wt 115.7 kg   SpO2 98%   BMI 36.59 kg/m  Physical Exam Vitals and nursing note reviewed.  Constitutional:      General: He is not in acute distress.  Appearance: Normal appearance. He is not ill-appearing or toxic-appearing.     Comments: Well-appearing, eating a sandwich.  Eyes:     General: No scleral icterus. Pulmonary:     Effort: Pulmonary effort is normal. No respiratory distress.  Abdominal:     Tenderness: There is no abdominal tenderness. There is no guarding or rebound.  Musculoskeletal:     Comments: No midline or paraspinal cervical, thoracic, or lumbar tenderness palpation.  Patient does have some tenderness to the mid lateral right buttock.  No increased area of warmth to the area.  No induration or fluctuance noted to the area.  Patient does have positive straight leg raise on the right.  Negative contralateral raise.  Pulses are intact.  Sensation intact throughout in the setting of symmetric bilaterally.  Coloration and temperature appear and feel symmetric. Compartments are soft.  Patient has pain with right leg raise on the right however strength is intact bilaterally. Patient has a steady gait with minimal use of his cane.   Skin:    General: Skin is dry.     Findings: No rash.  Neurological:     General: No focal deficit present.     Mental Status: He is alert. Mental status is at baseline.  Psychiatric:        Mood and Affect: Mood normal.     ED Results /  Procedures / Treatments   Labs (all labs ordered are listed, but only abnormal results are displayed) Labs Reviewed - No data to display  EKG None  Radiology DG Hip Unilat W or Wo Pelvis 2-3 Views Right  Result Date: 11/24/2021 CLINICAL DATA:  Right hip pain EXAM: DG HIP (WITH OR WITHOUT PELVIS) 2-3V RIGHT COMPARISON:  None Available. FINDINGS: There is no evidence of hip fracture or dislocation. 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 No appreciable soft tissue abnormality. 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 No acute findings. Electronically Signed   By: Davina Poke D.O.   On: 11/24/2021 14:16   DG Lumbar Spine Complete  Result Date: 11/24/2021 CLINICAL DATA:  Pain radiating to RIGHT leg EXAM: LUMBAR SPINE - COMPLETE 4+ VIEW COMPARISON:  None Available. FINDINGS: Normal alignment of lumbar vertebral bodies. No loss of vertebral body height or disc height. No pars fracture. No subluxation. Endplate spurring at Y7-C6. IMPRESSION: No acute osseous abnormality. Electronically Signed   By: Suzy Bouchard M.D.   On: 11/24/2021 14:15    Procedures Procedures   Medications Ordered in ED Medications  lidocaine (LIDODERM) 5 % 1 patch (1 patch Transdermal Patch Applied 11/24/21 1343)  oxyCODONE-acetaminophen (PERCOCET/ROXICET) 5-325 MG per tablet 1 tablet (1 tablet Oral Given 11/24/21 1343)    ED Course/ Medical Decision Making/ A&P                           Medical Decision Making Amount and/or Complexity of Data Reviewed Radiology: ordered.  Risk Prescription drug management.   61 year old male presents to the emergency room for evaluation of her right hip pain for the past 3 weeks.  Differential diagnosis includes but is not limited to arthritis, sciatica, degenerative disc disease, chronic back pain, radiculopathy, septic arthritis, cauda equina.  Vital signs show slightly elevated blood pressure 150/83 otherwise afebrile, normal pulse rate,  satting well on room air without increased work of breathing.  Physical exam as noted above.  We will order plain film of the lumbar spine and of the right hip.  Patient has a ride home.  We will order Percocet and lidocaine patches for the area.  Imaging shows of lumbar spine show normal alignment of lumbar vertebral bodies. No loss of vertebral body height or disc height. No pars fracture. No subluxation. Endplate spurring at W8-G8. XR of hip shows mild degenerative changes of the bilateral hips. No acute findings.   This pain has been going on for 3 months.  Could be some sciatica, could be from patient's chronic back pain.  Advised that the patient follow-up with his current back specialist.  He reports some relief of pain with the lidocaine patch and Percocet.  Will prescribe the patient some tizanidine for pain relief as well.  Asked the patient to follow-up with his back specialist.  He reports that he scheduled an appointment for Thursday while he was waiting.  Coloration and temperature are symmetric.  He is ambulatory with ease with minimal use of his cane.  He does not have any midline tenderness to his cervical, thoracic, or lumbar spine.  He does not have any back red flag symptoms.  I think this patient is stable for discharge home with close outpatient follow-up with his back pain specialist adherence to Tylenol and muscle relaxer.  I discussed the imaging results with patient and family at bedside.  We discussed this is some degenerative changes and is likely from his back pain that is chronic already.  Discussed to follow-up with his back specialist.  We discussed return precautions for flag symptoms.  Patient verbalizes understanding and agrees to the plan.  Patient is stable being discharged home in good condition.  Final Clinical Impression(s) / ED Diagnoses Final diagnoses:  Right hip pain  Sciatica of right side    Rx / DC Orders ED Discharge Orders          Ordered     methocarbamol (ROBAXIN) 500 MG tablet  Every 6 hours PRN        11/24/21 1611              Sherrell Puller, PA-C 11/24/21 1612    Isla Pence, MD 11/27/21 1707

## 2021-11-24 NOTE — Discharge Instructions (Addendum)
You were seen here today for evaluation of your right hip pain.  I likely think this is from your chronic back pain.  I advise you follow-up with your back specialist so please keep your appointment that you have scheduled on Thursday.  If you have any numbness, tingling, urinating or having a bowel movement on yourself, fevers, please return to the nearest emergency department for reevaluation.  If you have any coloration or temperature changes, again please return to the nearest emergency room for evaluation.  You can take Tylenol and/or ibuprofen as needed for pain.  I prescribed you a muscle laxer they can take as needed as well.  Please do not drive or operate heavy machinery as this medication can make you sleepy.  Additionally, you can pick up over-the-counter lidocaine patches to use as needed for pain.  Contact a doctor if: Your pain is not controlled by medicine. Your pain does not get better. Your pain gets worse. Your pain lasts longer than 4 weeks. You lose weight without trying. Get help right away if: You cannot control when you pee (urinate) or poop (have a bowel movement). You have weakness in any of these areas and it gets worse: Lower back. The area between your hip bones. Butt. Legs. You have redness or swelling of your back. You have a burning feeling when you pee.

## 2021-12-06 ENCOUNTER — Other Ambulatory Visit: Payer: Self-pay | Admitting: Interventional Cardiology

## 2021-12-14 ENCOUNTER — Telehealth: Payer: Self-pay | Admitting: Cardiology

## 2021-12-14 NOTE — Telephone Encounter (Signed)
Patient is calling stating everything was approved for him to get a CPAP, but he has not heard anything regarding receiving it. Please advise.

## 2021-12-17 NOTE — Telephone Encounter (Signed)
Pt calling back for update  

## 2021-12-21 NOTE — Telephone Encounter (Signed)
Patient has an upcoming appointment with [facility] to get set up as he rescheduled the first time due to transportation issues.

## 2021-12-31 ENCOUNTER — Other Ambulatory Visit: Payer: Self-pay | Admitting: Student

## 2021-12-31 DIAGNOSIS — M5416 Radiculopathy, lumbar region: Secondary | ICD-10-CM

## 2022-01-01 ENCOUNTER — Encounter (HOSPITAL_COMMUNITY): Payer: Self-pay | Admitting: Emergency Medicine

## 2022-01-01 ENCOUNTER — Emergency Department (HOSPITAL_COMMUNITY)
Admission: EM | Admit: 2022-01-01 | Discharge: 2022-01-01 | Disposition: A | Discharge status: Home / Self Care | Payer: Medicare Other | Source: Home / Self Care

## 2022-01-01 ENCOUNTER — Emergency Department (HOSPITAL_COMMUNITY)
Admission: EM | Admit: 2022-01-01 | Discharge: 2022-01-02 | Disposition: A | Discharge status: Home / Self Care | Payer: Medicare Other | Attending: Emergency Medicine | Admitting: Emergency Medicine

## 2022-01-01 ENCOUNTER — Other Ambulatory Visit: Payer: Self-pay

## 2022-01-01 ENCOUNTER — Emergency Department (HOSPITAL_COMMUNITY): Payer: Medicare Other

## 2022-01-01 DIAGNOSIS — Z1152 Encounter for screening for COVID-19: Secondary | ICD-10-CM | POA: Diagnosis not present

## 2022-01-01 DIAGNOSIS — Z79899 Other long term (current) drug therapy: Secondary | ICD-10-CM | POA: Insufficient documentation

## 2022-01-01 DIAGNOSIS — J209 Acute bronchitis, unspecified: Secondary | ICD-10-CM | POA: Insufficient documentation

## 2022-01-01 DIAGNOSIS — Z7951 Long term (current) use of inhaled steroids: Secondary | ICD-10-CM | POA: Insufficient documentation

## 2022-01-01 DIAGNOSIS — I2510 Atherosclerotic heart disease of native coronary artery without angina pectoris: Secondary | ICD-10-CM | POA: Insufficient documentation

## 2022-01-01 DIAGNOSIS — I509 Heart failure, unspecified: Secondary | ICD-10-CM | POA: Insufficient documentation

## 2022-01-01 DIAGNOSIS — J449 Chronic obstructive pulmonary disease, unspecified: Secondary | ICD-10-CM | POA: Diagnosis not present

## 2022-01-01 DIAGNOSIS — R051 Acute cough: Secondary | ICD-10-CM

## 2022-01-01 DIAGNOSIS — R059 Cough, unspecified: Secondary | ICD-10-CM | POA: Diagnosis present

## 2022-01-01 LAB — URINALYSIS, ROUTINE W REFLEX MICROSCOPIC
Bacteria, UA: NONE SEEN
Bilirubin Urine: NEGATIVE
Glucose, UA: NEGATIVE mg/dL
Hgb urine dipstick: NEGATIVE
Ketones, ur: NEGATIVE mg/dL
Nitrite: NEGATIVE
Protein, ur: NEGATIVE mg/dL
Specific Gravity, Urine: 1.018 (ref 1.005–1.030)
pH: 5 (ref 5.0–8.0)

## 2022-01-01 LAB — COMPREHENSIVE METABOLIC PANEL
ALT: 32 U/L (ref 0–44)
AST: 44 U/L — ABNORMAL HIGH (ref 15–41)
Albumin: 4.3 g/dL (ref 3.5–5.0)
Alkaline Phosphatase: 100 U/L (ref 38–126)
Anion gap: 9 (ref 5–15)
BUN: 12 mg/dL (ref 6–20)
CO2: 23 mmol/L (ref 22–32)
Calcium: 10.4 mg/dL — ABNORMAL HIGH (ref 8.9–10.3)
Chloride: 110 mmol/L (ref 98–111)
Creatinine, Ser: 0.97 mg/dL (ref 0.61–1.24)
GFR, Estimated: >60 mL/min (ref >60)
Glucose, Bld: 117 mg/dL — ABNORMAL HIGH (ref 70–99)
Potassium: 4.2 mmol/L (ref 3.5–5.1)
Sodium: 142 mmol/L (ref 135–145)
Total Bilirubin: 1.1 mg/dL (ref 0.3–1.2)
Total Protein: 7.6 g/dL (ref 6.5–8.1)

## 2022-01-01 LAB — CBC
HCT: 40.2 % (ref 39.0–52.0)
Hemoglobin: 13.3 g/dL (ref 13.0–17.0)
MCH: 29.7 pg (ref 26.0–34.0)
MCHC: 33.1 g/dL (ref 30.0–36.0)
MCV: 89.7 fL (ref 80.0–100.0)
Platelets: 233 10*3/uL (ref 150–400)
RBC: 4.48 MIL/uL (ref 4.22–5.81)
RDW: 13.9 % (ref 11.5–15.5)
WBC: 12.4 10*3/uL — ABNORMAL HIGH (ref 4.0–10.5)
nRBC: 0 % (ref 0.0–0.2)

## 2022-01-01 LAB — RESP PANEL BY RT-PCR (FLU A&B, COVID) ARPGX2
Influenza A by PCR: NEGATIVE
Influenza B by PCR: NEGATIVE
SARS Coronavirus 2 by RT PCR: NEGATIVE

## 2022-01-01 LAB — LIPASE, BLOOD: Lipase: 36 U/L (ref 11–51)

## 2022-01-01 LAB — BRAIN NATRIURETIC PEPTIDE: B Natriuretic Peptide: 64.8 pg/mL (ref 0.0–100.0)

## 2022-01-01 NOTE — ED Provider Triage Note (Signed)
Emergency Medicine Provider Triage Evaluation Note  CID AGENA , a 61 y.o. male  was evaluated in triage.  Pt complains of cough, SOB, and diarrhea for 2 weeks. Diarrhea every time he uses the bathroom, sometimes seeing dark black stools. On plavix. Productive cough with thick phlegm. Just got a CPAP machine.   Review of Systems  Positive: Cough, SOB, diarrhea, nausea, chills Negative: syncope  Physical Exam  BP (!) 122/90   Pulse 62   Temp 97.8 F (36.6 C) (Oral)   Resp 16   SpO2 93%  Gen:   Awake, no distress   Resp:  Normal effort  MSK:   Moves extremities without difficulty  Other:    Medical Decision Making  Medically screening exam initiated at 7:24 PM.  Appropriate orders placed.  DAMARCUS REGGIO was informed that the remainder of the evaluation will be completed by another provider, this initial triage assessment does not replace that evaluation, and the importance of remaining in the ED until their evaluation is complete.  Workup initiated   Kateri Plummer, Hershal Coria 01/01/22 1926

## 2022-01-01 NOTE — ED Triage Notes (Signed)
Patient reports SOB with chest congestion and productive cough this week , denies fever or chills , history of COPD , he adds diarrhea today .

## 2022-01-02 ENCOUNTER — Emergency Department (HOSPITAL_COMMUNITY): Payer: Medicare Other

## 2022-01-02 DIAGNOSIS — J209 Acute bronchitis, unspecified: Secondary | ICD-10-CM | POA: Diagnosis not present

## 2022-01-02 MED ORDER — BENZONATATE 100 MG PO CAPS: 100.0000 mg | ORAL_CAPSULE | Freq: Three times a day (TID) | ORAL | 0 refills | Status: DC | PRN

## 2022-01-02 MED ORDER — BENZONATATE 100 MG PO CAPS
100.0000 mg | ORAL_CAPSULE | Freq: Once | ORAL | Status: AC
  Administered 2022-01-02: 100 mg via ORAL
  Filled 2022-01-02: qty 1

## 2022-01-02 MED ORDER — IOHEXOL 350 MG/ML SOLN
75.0000 mL | Freq: Once | INTRAVENOUS | Status: AC | PRN
  Administered 2022-01-02: 75 mL via INTRAVENOUS

## 2022-01-02 NOTE — ED Provider Notes (Signed)
[facility] EMERGENCY DEPARTMENT Provider Note   CSN: [id number] Arrival date & time: 01/01/22  1826     History  Chief Complaint  Patient presents with   SOB/Cough/Diarrhea    COPD/CAD/CHF    DESTIN VINSANT is a 61 y.o. male.  The history is provided by the patient and medical records.  ARRICK DUTTON is a 61 y.o. male who presents to the Emergency Department complaining of cough.  He presents to the ED accompanied by his son for evaluation of cough that has been present for the last two weeks.  He was supposed to start on CPAP on Wednesday, but has not been able to start because the machine is making his cough worse.  Cough is productive of sputum.  He called his doctor and was started on abx (doxy 11/8) as well as medrol dose pack.  Has associated diarrhea that started today.  Has poor appetite.   Has headache.  Feels feverish at times.  Feels generalized weakness.  Cough is worse with lying flat.  No chest pain. Has sob and DOE.  Has intermittent BLE edema.   Has a prior hx/o DVT, not on anticoagulation    Home Medications Prior to Admission medications   Medication Sig Start Date End Date Taking? Authorizing Provider  albuterol (PROVENTIL HFA;VENTOLIN HFA) 108 (90 Base) MCG/ACT inhaler Inhale 2 puffs into the lungs every 6 (six) hours as needed for wheezing or shortness of breath.    Yes [provider]  albuterol (PROVENTIL) (2.5 MG/3ML) 0.083% nebulizer solution Take 2.5 mg by nebulization every 6 (six) hours as needed for wheezing or shortness of breath.    Yes [provider]  ALPRAZolam Duanne Moron) 1 MG tablet Take 1 mg by mouth 3 (three) times daily.  08/01/19  Yes [provider]  amLODipine (NORVASC) 5 MG tablet TAKE 1 TABLET(5 MG) BY MOUTH DAILY Patient taking differently: Take 5 mg by mouth daily. 04/07/20  Yes BranchAlphonse Guild, MD  atorvastatin (LIPITOR) 80 MG tablet Take 80 mg by mouth at bedtime. 12/05/20  Yes [provider]  azelastine (ASTELIN) 0.1 % nasal spray Place 1 spray into both nostrils 2 (two) times daily. 12/29/21  Yes [provider]  beclomethasone (QVAR) 80 MCG/ACT inhaler Inhale 2 puffs into the lungs 2 (two) times daily as needed (respiratory issues.).    Yes [provider]  BELSOMRA 15 MG TABS Take 1 tablet by mouth at bedtime. 04/30/21  Yes [provider]  benzonatate (TESSALON) 100 MG capsule Take 1 capsule (100 mg total) by mouth 3 (three) times daily as needed for cough. 01/02/22  Yes Quintella Reichert, MD  brimonidine (ALPHAGAN P) 0.1 % SOLN Place 1 drop into the right eye 2 (two) times daily. 12/25/20  Yes [provider]  cetirizine (ZYRTEC) 10 MG tablet Take 10 mg by mouth daily as needed for allergies.  01/26/16  Yes [provider]  clopidogrel (PLAVIX) 75 MG tablet Take 75 mg by mouth daily.   Yes [provider]  dexlansoprazole (DEXILANT) 60 MG capsule Take 1 capsule (60 mg total) by mouth daily. 07/05/17  Yes Fields, Sandi L, MD  ezetimibe (ZETIA) 10 MG tablet Take 10 mg by mouth daily.   Yes [provider]  famotidine (PEPCID) 20 MG tablet Take 20 mg by mouth at bedtime. 12/29/21  Yes [provider]  fluticasone (FLONASE) 50 MCG/ACT nasal spray Place 2 sprays into both nostrils daily.   Yes [provider]  Fluticasone Furoate (ARNUITY ELLIPTA) 200 MCG/ACT AEPB Inhale 1 puff into the lungs daily as needed (shortness of breath).    Yes [provider]  furosemide (LASIX) 40 MG tablet TAKE 1 TABLET(40 MG) BY MOUTH DAILY Patient taking differently: Take 40 mg by mouth daily. 12/07/21  Yes Belva Crome, MD  glipiZIDE (GLUCOTROL) 10 MG tablet Take 10 mg by mouth daily before breakfast.   Yes [provider]  ipratropium (ATROVENT) 0.02 % nebulizer solution Take 0.5 mg by nebulization 2 (two) times daily as needed for wheezing or shortness of breath (for congestion).    Yes [provider]  isosorbide mononitrate (IMDUR) 30 MG 24 hr tablet Take 30 mg by mouth daily. 08/04/20  Yes [provider]  ketoconazole (NIZORAL) 2 % shampoo Apply 1 application topically daily as needed for irritation.  10/23/19  Yes [provider]  latanoprost (XALATAN) 0.005 % ophthalmic solution Place 1 drop into both eyes at bedtime.   Yes [provider]  LINZESS 290 MCG CAPS capsule TAKE 1 CAPSULE(290 MCG) BY [place] BREAKFAST Patient taking differently: Take 290 mcg by mouth daily before breakfast. May take 1 additional capsule as needed for constipation 02/04/21  Yes Mahala Menghini, PA-C  lisinopril-hydrochlorothiazide (ZESTORETIC) 10-12.5 MG tablet Take 1 tablet by mouth daily. 03/11/21  Yes [provider]  magnesium citrate SOLN Take 1 Bottle by mouth as needed for moderate constipation.    Yes [provider]  doxycycline (VIBRA-TABS) 100 MG tablet Take 100 mg by mouth 2 (two) times daily. Patient not taking: Reported on 01/02/2022 12/30/21   [provider]  MEDROL 4 MG TBPK tablet See admin instructions. Patient not taking: Reported on 01/02/2022 12/31/21   [provider]  metFORMIN (GLUCOPHAGE-XR) 500 MG 24 hr tablet Take 500 mg by mouth daily with breakfast.     [provider]  methocarbamol (ROBAXIN) 500 MG tablet Take 1 tablet (500 mg total) by mouth every 6 (six) hours as needed. 11/24/21   Sherrell Puller, PA-C  metoprolol tartrate (LOPRESSOR) 50 MG tablet TAKE 1 TABLET(50 MG) BY MOUTH TWICE DAILY 06/09/21   Belva Crome, MD  Multiple Vitamin (MULTIVITAMIN WITH MINERALS) TABS tablet Take 1 tablet by mouth daily.    [provider]  naloxegol oxalate (MOVANTIK) 25 MG TABS tablet Take 1 tablet (25 mg total) by mouth daily. 08/21/19   Carlis Stable, NP  NARCAN 4 MG/0.1ML LIQD nasal spray kit Place 1 spray into the nose as needed (opioid overdose).  06/15/17   [provider]   nitroGLYCERIN (NITROSTAT) 0.4 MG SL tablet Place 1 tablet (0.4 mg total) under the tongue every 5 (five) minutes as needed for chest pain. 08/26/21   Johnson, Clanford L, MD  oxyCODONE-acetaminophen (PERCOCET) 10-325 MG tablet Take 1 tablet by mouth every 4 (four) hours as needed for pain.    [provider]  potassium chloride SA (KLOR-CON) 20 MEQ tablet Take 20 mEq by mouth 2 (two) times daily.    [provider]  predniSONE (DELTASONE) 10 MG tablet Take 40 mg by mouth daily. 04/24/21   [provider]  REPATHA SURECLICK 859 MG/ML SOAJ ADMINISTER 1 ML UNDER THE SKIN EVERY 14 DAYS 03/31/21   Arnoldo Lenis, MD  sertraline (ZOLOFT) 100 MG tablet Take 100 mg by mouth daily.    [provider]  sitaGLIPtin (JANUVIA) 100 MG tablet Take 100 mg by mouth daily.    [provider]  sucralfate (CARAFATE) 1 GM/10ML suspension in the morning, at noon, in the evening, and at bedtime. 09/10/20   [provider]  tamsulosin (FLOMAX) 0.4 MG CAPS capsule Take 1 capsule (0.4 mg total) by mouth daily after breakfast. 01/07/16   Kerrie Buffalo, NP  Vitamin D, Ergocalciferol, (DRISDOL) 1.25 MG (50000 UT) CAPS capsule Take 50,000 Units by mouth every 7 (seven) days. Monday    [provider]      Allergies    Gabapentin, Ibuprofen, Zolpidem tartrate, and Naproxen    Review of Systems   Review of Systems  All other systems reviewed and are negative.   Physical Exam Updated Vital Signs BP (!) 142/82   Pulse 69   Temp 97.9 F (36.6 C) (Oral)   Resp 16   SpO2 100%  Physical Exam Vitals and nursing note reviewed.  Constitutional:      Appearance: He is well-developed.  HENT:     Head: Normocephalic and atraumatic.  Cardiovascular:     Rate and Rhythm: Normal rate and regular rhythm.     Heart sounds: No murmur heard. Pulmonary:     Effort: Pulmonary effort is normal. No respiratory distress.     Breath sounds: Normal breath sounds.   Abdominal:     Palpations: Abdomen is soft.     Tenderness: There is no abdominal tenderness. There is no guarding or rebound.  Musculoskeletal:        General: No tenderness.     Comments: Trace edema to BLE, right greater than left.    Skin:    General: Skin is warm and dry.  Neurological:     Mental Status: He is alert and oriented to person, place, and time.  Psychiatric:        Behavior: Behavior normal.     ED Results / Procedures / Treatments   Labs (all labs ordered are listed, but only abnormal results are displayed) Labs Reviewed  COMPREHENSIVE METABOLIC PANEL - Abnormal; Notable for the following components:      Result Value   Glucose, Bld 117 (*)    Calcium 10.4 (*)    AST 44 (*)    All other components within normal limits  CBC - Abnormal; Notable for the following components:   WBC 12.4 (*)    All other components within normal limits  URINALYSIS, ROUTINE W REFLEX MICROSCOPIC - Abnormal; Notable for the following components:   Leukocytes,Ua TRACE (*)    All other components within normal limits  RESP PANEL BY RT-PCR (FLU A&B, COVID) ARPGX2  LIPASE, BLOOD  BRAIN NATRIURETIC PEPTIDE    EKG EKG Interpretation  Date/Time:  Friday January 01 2022 19:47:54 EST Ventricular Rate:  66 PR Interval:  164 QRS Duration: 90 QT Interval:  384 QTC Calculation: 402 R Axis:   45 Text Interpretation: Sinus rhythm with sinus arrhythmia with occasional Premature ventricular complexes Cannot rule out Inferior infarct , age undetermined Abnormal ECG Confirmed by Quintella Reichert [id number]) on 01/02/2022 1:11:35 AM  Radiology CT Angio Chest PE W/Cm &/Or Wo Cm  Result Date: 01/02/2022 CLINICAL DATA:  61 year old male with shortness of breath, productive cough and congestion. EXAM: CT ANGIOGRAPHY CHEST WITH CONTRAST TECHNIQUE: Multidetector CT imaging of the chest was performed using the standard protocol during bolus administration of intravenous contrast. Multiplanar CT  image reconstructions and MIPs were obtained to evaluate the vascular anatomy. RADIATION DOSE REDUCTION: This exam was performed according to the departmental dose-optimization program which includes automated exposure control, adjustment  of the mA and/or kV according to patient size and/or use of iterative reconstruction technique. CONTRAST:  41m OMNIPAQUE IOHEXOL 350 MG/ML SOLN COMPARISON:  Chest radiographs 01/01/2022. Cardiac CT 10/18/2019. Chest CT 07/26/2013. FINDINGS: Cardiovascular: Adequate contrast bolus timing in the pulmonary arterial tree. Mild apical and lung base respiratory motion. No pulmonary artery filling defect identified. Prior sternotomy, CABG. Stable heart size compared to the 2021 cardiac exam. No pericardial effusion. Mild visible aortic atherosclerosis. There is a patent left subclavian artery stent from the aortic arch. Mediastinum/Nodes: Negative. No mediastinal mass or lymphadenopathy. Lungs/Pleura: Mildly lower lung volumes compared to a 2015 CT. Major airways remain patent. Mild atelectasis. Both lungs otherwise appear stable since 2015 with no convincing pulmonary inflammation, no pleural effusion. Upper Abdomen: Negative visible mostly noncontrast liver, gallbladder, spleen, pancreas, adrenal glands, kidneys, and bowel in the upper abdomen. Musculoskeletal: Chronic sternotomy. No acute osseous abnormality identified. Review of the MIP images confirms the above findings. IMPRESSION: 1. Negative for acute pulmonary embolus. 2. Mild atelectasis. No acute or inflammatory process identified in the chest. 3. Prior CABG. Stable cardiomegaly. Patent vascular stent of the proximal left subclavian artery. Electronically Signed   By: HGenevie AnnM.D.   On: 01/02/2022 05:28   DG Chest 2 View  Result Date: 01/01/2022 CLINICAL DATA:  Shortness of breath EXAM: CHEST - 2 VIEW COMPARISON:  08/25/2021 FINDINGS: Post sternotomy changes. No acute airspace disease or effusion. Stable cardiomediastinal  silhouette. Vascular stent superior to the aortic knob. No pneumothorax. IMPRESSION: No active cardiopulmonary disease. Electronically Signed   By: KDonavan FoilM.D.   On: 01/01/2022 20:18    Procedures Procedures    Medications Ordered in ED Medications  benzonatate (TESSALON) capsule 100 mg (100 mg Oral Given 01/02/22 0320)  iohexol (OMNIPAQUE) 350 MG/ML injection 75 mL (75 mLs Intravenous Contrast Given 01/02/22 0453)    ED Course/ Medical Decision Making/ A&P                           Medical Decision Making Amount and/or Complexity of Data Reviewed Radiology: ordered.  Risk Prescription drug management.   Patient here for evaluation of 2 weeks of cough, shortness of breath.  He has been started on doxycycline and prednisone as an outpatient.  He does feel like his symptoms are mildly improved since starting these medications.  Lungs are clear on evaluation with no respiratory distress.  Labs are significant for normal hemoglobin, normal BNP.  Chest x-ray is negative for acute infiltrate or pulmonary edema-images personally reviewed and agree with radiologist interpretation.  EKG is abnormal but no significant change from priors.  Patient has no chest pain, doubt ACS.  He is negative for COVID and influenza.  Given his history of DVT he is considered relatively high risk for PE and a CTA was obtained.  CTA is negative for PE, pneumonia.  He does feel significant improvement in his symptoms after Tessalon Perles.  Feel at this point patient is stable for discharge home with medications for his cough.  He has developed diarrhea after starting the doxycycline.  He does report this is black at times.  DRE is with no gross blood.  Doubt acute major GI bleed.  Discussed that he may stop the doxycycline as this may be contributing to his diarrhea.  I do recommend that he continues to prednisone.  Prednisone is likely responsible for the mild leukocytosis.  Discussed importance of PCP follow-up  as well as return  precautions.        Final Clinical Impression(s) / ED Diagnoses Final diagnoses:  Acute bronchitis, unspecified organism  Acute cough    Rx / DC Orders ED Discharge Orders          Ordered    benzonatate (TESSALON) 100 MG capsule  3 times daily PRN        01/02/22 0555              Quintella Reichert, MD 01/02/22 [phone]

## 2022-01-02 NOTE — Progress Notes (Deleted)
Cardiology Office Note:    Date:  01/02/2022   ID:  Dalton [name], DOB Aug 03, 1960, MRN [id number]  PCP:  Nolene Ebbs, MD  Cardiologist:  Sinclair Grooms, MD   Referring MD: Nolene Ebbs, MD   No chief complaint on file.   History of Present Illness:    Dalton [name] is a 61 y.o. 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[place])     last seizure in 2011;unknown etiology-pt sts heriditary (12/24/2015)   QMVHQION(629.5)    "a few times/week" (12/24/2015)   High cholesterol    HNP (herniated nucleus pulposus), cervical    Hypertension    Laceration of right hand 11/27/2010   Laceration of wrist 2007 BIL FOREARMS   MI (myocardial infarction) ([place])    7, last one was in 2011 (12/24/2015)   Neuromuscular disorder ([place])    legs/feet   Neuropathy    legs/feet   NSAID-induced gastric ulcer    "Ibuprofen"   PUD (peptic ulcer disease)    in 1990s, secondary to medication   Rheumatoid arthritis ([place])    "all over" (12/24/2015)   Shortness of breath    with exertion   Tonsillitis, chronic    Dr. Vicki Mallet in [place]   Type II  diabetes mellitus ([place])    Wears glasses     Past Surgical History:  Procedure Laterality Date   ANTERIOR CERVICAL DECOMP/DISCECTOMY FUSION N/A 11/05/2016   Procedure: Anterior Cervical Discectomy and Fusion - Cervical three-Cervical four;  Surgeon: Earnie Larsson, MD;  Location: [facility];  Service: Neurosurgery;  Laterality: N/A;   BACK SURGERY     x3   BALLOON DILATION N/A 12/04/2019   Procedure: BALLOON DILATION;  Surgeon: Eloise Harman, DO;  [facility];  Service: Endoscopy;  Laterality: N/A;   BIOPSY N/A 05/30/2012   Procedure: BIOPSY;  Surgeon: Danie Binder, MD;  [facility];  Service: Endoscopy;  Laterality: N/A;   BIOPSY  03/02/2016   Procedure: BIOPSY;  Surgeon: Danie Binder, MD;  [facility];  Service: Endoscopy;;  gastric   BIOPSY  12/04/2019   Procedure: BIOPSY;  Surgeon: Eloise Harman, DO;  [facility];  Service: Endoscopy;;   CARDIAC CATHETERIZATION  "several"   CARPAL TUNNEL RELEASE Bilateral    CATARACT EXTRACTION W/PHACO Right 06/15/2016   Procedure: CATARACT EXTRACTION PHACO AND INTRAOCULAR LENS PLACEMENT ([place]);  Surgeon: Rutherford Guys, MD;  Location: [facility];  Service:  Ophthalmology;  Laterality: Right;  CDE: 4.64   CATARACT EXTRACTION W/PHACO Left 07/13/2016   Procedure: CATARACT EXTRACTION PHACO AND INTRAOCULAR LENS PLACEMENT ([facility]);  Surgeon: Rutherford Guys, MD;  Location: [facility];  Service: Ophthalmology;  Laterality: Left;  CDE: 3.15   COLONOSCOPY  12/28/2010   SLF: (MAC)Internal hemorrhoids/four small colon polyps tubular adenomas. per SLF: colonoscopy 2022   COLONOSCOPY WITH PROPOFOL N/A 07/05/2017   Surgeon: Danie Binder, MD; tubular adenoma, hyperplastic polyp, submucosal nodule at hepatic flexure consistent with lipoma on biopsy, diverticulosis and rectosigmoid and sigmoid colon, rectal bleeding due to large internal hemorrhoids.  Recommended repeat in 3 years due to history of polyps and oily film on scope.    CORONARY ARTERY BYPASS GRAFT  2002   3 vessels   ESOPHAGOGASTRODUODENOSCOPY N/A 05/30/2012   SLF: UNCONTROLLED GERD DUE TO LIFESTYLE CHOICE/WEIGHT GAIN/MILD Non-erosive gastritis   ESOPHAGOGASTRODUODENOSCOPY (EGD) WITH PROPOFOL N/A 03/02/2016   Dr. Oneida Alar: Esophagus appeared normal, impaired dilation performed, patchy inflammation with edema and erythema of the entire stomach., Biopsy with H pylori, patient completed Pylera.    ESOPHAGOGASTRODUODENOSCOPY (EGD) WITH PROPOFOL N/A 12/04/2019   Surgeon: Eloise Harman, DO; benign appearing esophageal stenosis dilated, gastritis biopsied (mild reactive gastropathy, gastritis, negative H. pylori), normal duodenum.   FRACTURE SURGERY     LEFT HEART CATHETERIZATION WITH CORONARY ANGIOGRAM N/A 08/31/2011   Procedure: LEFT HEART CATHETERIZATION WITH CORONARY ANGIOGRAM;  Surgeon: Laverda Page, MD;  [facility];  Service: Cardiovascular;  Laterality: N/A;   LUMBAR DISC SURGERY     "L4-5; Dr. Trenton Gammon"   LUMBAR LAMINECTOMY/DECOMPRESSION MICRODISCECTOMY Right 03/06/2019   Procedure: MICRODISCECTOMY EXTRAFORAMINAL LUMBAR THREE - LUMBAR FOUR RIGHT;  Surgeon: Earnie Larsson, MD;  Location: [facility];  Service: Neurosurgery;  Laterality: Right;  MICRODISCECTOMY EXTRAFORAMINAL LUMBAR THREE - LUMBAR FOUR RIGHT   MULTIPLE TOOTH EXTRACTIONS     ORIF MANDIBULAR FRACTURE N/A 12/19/2015   Procedure: OPEN REDUCTION INTERNAL FIXATION (ORIF) MANDIBULAR FRACTURE;  Surgeon: Izora Gala, MD;  Location: [facility];  Service: ENT;  Laterality: N/A;   PATELLA FRACTURE SURGERY Left 1976   plate to knee cap from accident   POLYPECTOMY  07/05/2017   Procedure: POLYPECTOMY;  Surgeon: Danie Binder, MD;  [facility];  Service: Endoscopy;;  colon   SAVORY DILATION  12/28/2010   SLF:(MAC)J-shaped stomach/nodular mocosa in the distal esophagus/empiric dilation 63m   SAVORY DILATION N/A 03/02/2016   Procedure: SAVORY DILATION;  Surgeon: SDanie Binder MD;   [facility];  Service: Endoscopy;  Laterality: N/A;    Current Medications: No outpatient medications have been marked as taking for the 01/05/22 encounter (Appointment) with SBelva Crome MD.     Allergies:   Gabapentin, Ibuprofen, Zolpidem tartrate, and Naproxen   Social History   Socioeconomic History   Marital status: Single    Spouse name: Not on file   Number of children: 2   Years of education: Not on file   Highest education level: Not on file  Occupational History   Occupation: disabled    Employer: NOT EMPLOYED  Tobacco Use   Smoking status: Never   Smokeless tobacco: Never  Vaping Use   Vaping Use: Never used  Substance and Sexual Activity   Alcohol use: No    Alcohol/week: 0.0 standard drinks of alcohol   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)   Sexual activity: Not Currently  Other Topics Concern   Not on file  Social History Narrative   Lives w/ son-23/22   Social Determinants of Health   Financial Resource Strain: Not on file  Food Insecurity: Not on file  Transportation Needs: Not on file  Physical Activity: Not on file  Stress: Not on file  Social Connections: Not on file     Family History: The patient's family history includes Diabetes in his father and mother; Heart attack in his brother, sister, and another family member; Heart attack (age of onset: 37) in his father; Heart attack (age of onset: 62) in his mother; Heart failure in an other family member; Hypertension in his father and mother; Seizures in his brother. There is no history of Colon cancer, Liver disease, Anesthesia problems, Hypotension, Malignant hyperthermia, Pseudochol deficiency, or Colon polyps.  ROS:   Please see the history of present illness.    *** All other systems reviewed and are negative.  EKGs/Labs/Other Studies Reviewed:    The following studies were reviewed  today: ***  EKG:  EKG ***  Recent Labs: 03/11/2021: TSH 1.81 08/26/2021: Magnesium 1.7 01/01/2022: ALT 32; B Natriuretic Peptide 64.8; BUN 12; Creatinine, Ser 0.97; Hemoglobin 13.3; Platelets 233; Potassium 4.2; Sodium 142  Recent Lipid Panel    Component Value Date/Time   CHOL 125 03/11/2021 0000   CHOL 131 08/14/2019 1238   TRIG 162 (H) 03/11/2021 0000   HDL 59 03/11/2021 0000   HDL 37 (L) 08/14/2019 1238   CHOLHDL 2.1 03/11/2021 0000   VLDL 69 (H) 12/16/2015 0630   LDLCALC 42 03/11/2021 0000    Physical Exam:    VS:  There were no vitals taken for this visit.    Wt Readings from Last 3 Encounters:  11/24/21 255 lb (115.7 kg)  10/14/21 280 lb 9.6 oz (127.3 kg)  08/26/21 280 lb 11.2 oz (127.3 kg)     GEN: ***. No acute distress HEENT: Normal NECK: No JVD. LYMPHATICS: No lymphadenopathy CARDIAC: *** murmur. RRR *** gallop, or edema. VASCULAR: *** Normal Pulses. No bruits. RESPIRATORY:  Clear to auscultation without rales, wheezing or rhonchi  ABDOMEN: Soft, non-tender, non-distended, No pulsatile mass, MUSCULOSKELETAL: No deformity  SKIN: Warm and dry NEUROLOGIC:  Alert and oriented x 3 PSYCHIATRIC:  Normal affect   ASSESSMENT:    1. Coronary artery disease of bypass graft of native heart with stable angina pectoris ([place])   2. OSA (obstructive sleep apnea)   3. Chronic heart failure with preserved ejection fraction ([place])   4. Essential hypertension   5. Hyperlipidemia LDL goal <70   6. PAD (peripheral artery disease) (HCC)    PLAN:    In order of problems listed above:  ***   Medication Adjustments/Labs and Tests Ordered: Current medicines are reviewed at length with the patient today.  Concerns regarding medicines are outlined above.  No orders of the defined types were placed in this encounter.  No orders of the defined types were placed in this encounter.   There are no Patient Instructions on file for this visit.   Signed, Sinclair Grooms, MD   01/02/2022 5:48 PM    [place]

## 2022-01-02 NOTE — Discharge Instructions (Signed)
Continue taking your methylprednisone (steroid).  It is Ok to stop taking the doxycycline.  Get rechecked if you have new or concerning symptoms.

## 2022-01-05 ENCOUNTER — Ambulatory Visit: Payer: Medicare Other | Admitting: Interventional Cardiology

## 2022-01-05 DIAGNOSIS — G4733 Obstructive sleep apnea (adult) (pediatric): Secondary | ICD-10-CM

## 2022-01-05 DIAGNOSIS — I739 Peripheral vascular disease, unspecified: Secondary | ICD-10-CM

## 2022-01-05 DIAGNOSIS — E785 Hyperlipidemia, unspecified: Secondary | ICD-10-CM

## 2022-01-05 DIAGNOSIS — I10 Essential (primary) hypertension: Secondary | ICD-10-CM

## 2022-01-05 DIAGNOSIS — I25708 Atherosclerosis of coronary artery bypass graft(s), unspecified, with other forms of angina pectoris: Secondary | ICD-10-CM

## 2022-01-05 DIAGNOSIS — I5032 Chronic diastolic (congestive) heart failure: Secondary | ICD-10-CM

## 2022-01-23 ENCOUNTER — Ambulatory Visit
Admission: RE | Admit: 2022-01-23 | Discharge: 2022-01-23 | Disposition: A | Discharge status: Home / Self Care | Payer: Medicare Other | Source: Ambulatory Visit | Attending: Student | Admitting: Student

## 2022-01-23 DIAGNOSIS — M5416 Radiculopathy, lumbar region: Secondary | ICD-10-CM

## 2022-01-23 MED ORDER — GADOPICLENOL 0.5 MMOL/ML IV SOLN
10.0000 mL | Freq: Once | INTRAVENOUS | Status: AC | PRN
  Administered 2022-01-23: 10 mL via INTRAVENOUS

## 2022-02-04 ENCOUNTER — Other Ambulatory Visit: Payer: Self-pay

## 2022-02-04 MED ORDER — METOPROLOL TARTRATE 50 MG PO TABS: ORAL_TABLET | ORAL | 3 refills | Status: DC

## 2022-02-11 ENCOUNTER — Ambulatory Visit: Payer: Medicare Other | Attending: Cardiology | Admitting: Cardiology

## 2022-03-04 ENCOUNTER — Other Ambulatory Visit (HOSPITAL_COMMUNITY): Payer: Self-pay

## 2022-03-09 ENCOUNTER — Other Ambulatory Visit: Payer: Self-pay | Admitting: Cardiology

## 2022-03-22 ENCOUNTER — Other Ambulatory Visit: Payer: Self-pay | Admitting: Internal Medicine

## 2022-03-23 LAB — COMPLETE METABOLIC PANEL WITH GFR
AG Ratio: 1.7 (calc) (ref 1.0–2.5)
ALT: 31 U/L (ref 9–46)
AST: 27 U/L (ref 10–35)
Albumin: 4.5 g/dL (ref 3.6–5.1)
Alkaline phosphatase (APISO): 116 U/L (ref 35–144)
BUN/Creatinine Ratio: 8 (calc) (ref 6–22)
BUN: 6 mg/dL — ABNORMAL LOW (ref 7–25)
CO2: 26 mmol/L (ref 20–32)
Calcium: 9.6 mg/dL (ref 8.6–10.3)
Chloride: 104 mmol/L (ref 98–110)
Creat: 0.76 mg/dL (ref 0.70–1.35)
Globulin: 2.6 g/dL (calc) (ref 1.9–3.7)
Glucose, Bld: 79 mg/dL (ref 65–99)
Potassium: 4 mmol/L (ref 3.5–5.3)
Sodium: 141 mmol/L (ref 135–146)
Total Bilirubin: 0.5 mg/dL (ref 0.2–1.2)
Total Protein: 7.1 g/dL (ref 6.1–8.1)
eGFR: 103 mL/min/{1.73_m2} (ref >=60)

## 2022-03-23 LAB — LIPID PANEL
Cholesterol: 90 mg/dL (ref <200)
HDL: 48 mg/dL (ref >=40)
LDL Cholesterol (Calc): 17 mg/dL (calc)
Non-HDL Cholesterol (Calc): 42 mg/dL (calc) (ref <130)
Total CHOL/HDL Ratio: 1.9 (calc) (ref <5.0)
Triglycerides: 178 mg/dL — ABNORMAL HIGH (ref <150)

## 2022-03-23 LAB — CBC
HCT: 36.8 % — ABNORMAL LOW (ref 38.5–50.0)
Hemoglobin: 12.8 g/dL — ABNORMAL LOW (ref 13.2–17.1)
MCH: 29.3 pg (ref 27.0–33.0)
MCHC: 34.8 g/dL (ref 32.0–36.0)
MCV: 84.2 fL (ref 80.0–100.0)
MPV: 11.4 fL (ref 7.5–12.5)
Platelets: 256 10*3/uL (ref 140–400)
RBC: 4.37 10*6/uL (ref 4.20–5.80)
RDW: 13.3 % (ref 11.0–15.0)
WBC: 5.9 10*3/uL (ref 3.8–10.8)

## 2022-03-23 LAB — VITAMIN D 25 HYDROXY (VIT D DEFICIENCY, FRACTURES): Vit D, 25-Hydroxy: 34 ng/mL (ref 30–100)

## 2022-03-23 LAB — TSH: TSH: 1.59 mIU/L (ref 0.40–4.50)

## 2022-04-08 ENCOUNTER — Telehealth: Payer: Self-pay | Admitting: *Deleted

## 2022-04-08 NOTE — Telephone Encounter (Signed)
   Pre-operative Risk Assessment    Patient Name: Dalton [name]  DOB: 24-Apr-1960 MRN: [id number]      Request for Surgical Clearance    Procedure:   RIGHT CARPAL TUNNEL RELEASE REVISION  Date of Surgery:  Clearance 04/14/22                                 Surgeon:  DR. Metro Kung Surgeon's Group or Practice Name:  [facility] Phone number:  [phone] ATTN: [place] Fax number:  [phone]   Type of Clearance Requested:   - Medical  - Pharmacy:  Hold Clopidogrel (Plavix)     Type of Anesthesia:   CHOICE   Additional requests/questions:    Jiles Prows   04/08/2022, 2:21 PM

## 2022-04-09 NOTE — Telephone Encounter (Signed)
I s/w the pt about a tele pre op appt or maybe a sooner appt in office. I did find a sooner appt in office 2/19 at [facility] location. Pt tells me that his surgery is 04/15/22 not 04/14/22 and he wants to keep the appt he has set with [place] on 04/13/22 8:25 with Ambrose Pancoast, NP. Pt tells me that he has been having some sob with walking or climbing a few stairs as well and wants to d/w provider while he is in the office.   I will update all parties involved.

## 2022-04-09 NOTE — Telephone Encounter (Signed)
   Name: Dalton [name]  DOB: Oct 06, 1960  MRN: [id number]  Primary Cardiologist: Sinclair Grooms, MD (Inactive)   Preoperative team, please contact this patient and set up a phone call appointment for further preoperative risk assessment. Please obtain consent and complete medication review. Thank you for your help.  I confirm that guidance regarding antiplatelet and oral anticoagulation therapy has been completed and, if necessary, noted below.  Pt on plavix with history of CABG x 1 with no known history of prior PCI, and subclavian stent. Recommend ASA while holding plavix unless bleeding risk too great.    Tami Lin Moxon Messler, PA 04/09/2022, 10:09 AM [place]

## 2022-04-12 NOTE — Progress Notes (Addendum)
Office Visit    Patient Name: Dalton [name] Date of Encounter: 04/12/2022  Primary Care Provider:  Nolene Ebbs, MD Primary Cardiologist:  Sinclair Grooms, MD (Inactive) Primary Electrophysiologist: None  Chief Complaint    Dalton [name] is a 62 y.o. male with PMH of CAD s/p CABG x 1 in 2002 (LIMA to LAD), HFpEF, HTN, HLD, DM II, rheumatoid arthritis, DVT, bipolar disorder, peptic ulcer disease, left subclavian stent, past cocaine abuse who presents today for preoperative clearance.  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[place])     last seizure in 2011;unknown etiology-pt sts heriditary (12/24/2015)   [id number])    "a few times/week" (12/24/2015)   High cholesterol    HNP (herniated nucleus pulposus), cervical    Hypertension    Laceration of right hand 11/27/2010   Laceration of wrist 2007 BIL FOREARMS   MI (myocardial infarction) ([place])    7, last one was in 2011 (12/24/2015)   Neuromuscular disorder ([place])    legs/feet   Neuropathy    legs/feet   NSAID-induced gastric ulcer    "Ibuprofen"   PUD (peptic ulcer disease)    in 1990s, secondary to medication   Rheumatoid arthritis ([place])    "all over" (12/24/2015)   Shortness of breath    with exertion   Tonsillitis, chronic    Dr. Vicki Mallet in [place]   Type II diabetes mellitus ([place])    Wears glasses    Past Surgical History:  Procedure Laterality Date   ANTERIOR  CERVICAL DECOMP/DISCECTOMY FUSION N/A 11/05/2016   Procedure: Anterior Cervical Discectomy and Fusion - Cervical three-Cervical four;  Surgeon: Earnie Larsson, MD;  Location: [place];  Service: Neurosurgery;  Laterality: N/A;   BACK SURGERY     x3   BALLOON DILATION N/A 12/04/2019   Procedure: BALLOON DILATION;  Surgeon: Eloise Harman, DO;  [facility];  Service: Endoscopy;  Laterality: N/A;   BIOPSY N/A 05/30/2012   Procedure: BIOPSY;  Surgeon: Danie Binder, MD;  Location[facility];  Service: Endoscopy;  Laterality: N/A;   BIOPSY  03/02/2016   Procedure: BIOPSY;  Surgeon: Danie Binder, MD;  [facility];  Service: Endoscopy;;  gastric   BIOPSY  12/04/2019   Procedure: BIOPSY;  Surgeon: Eloise Harman, DO;  [facility];  Service: Endoscopy;;   CARDIAC CATHETERIZATION  "several"   CARPAL TUNNEL RELEASE Bilateral    CATARACT EXTRACTION W/PHACO Right 06/15/2016   Procedure: CATARACT EXTRACTION PHACO AND INTRAOCULAR LENS PLACEMENT ([place]);  Surgeon: Rutherford Guys, MD;  Location: [facility];  Service: Ophthalmology;  Laterality: Right;  CDE: 4.64   CATARACT EXTRACTION W/PHACO Left 07/13/2016   Procedure: CATARACT EXTRACTION PHACO AND INTRAOCULAR LENS PLACEMENT (I[facility]);  Surgeon: Rutherford Guys, MD;  Location: [facility];  Service: Ophthalmology;  Laterality: Left;  CDE: 3.15   COLONOSCOPY  12/28/2010   SLF: (MAC)Internal hemorrhoids/four small colon polyps tubular adenomas. per SLF: colonoscopy 2022   COLONOSCOPY WITH PROPOFOL N/A 07/05/2017   Surgeon: Danie Binder, MD; tubular adenoma, hyperplastic polyp, submucosal nodule at hepatic flexure consistent with lipoma on biopsy, diverticulosis and rectosigmoid and sigmoid colon, rectal bleeding due to large internal hemorrhoids.  Recommended repeat in 3 years due to history of polyps and oily film on scope.   CORONARY ARTERY BYPASS GRAFT  2002   3 vessels   ESOPHAGOGASTRODUODENOSCOPY N/A 05/30/2012   SLF: UNCONTROLLED  GERD DUE TO LIFESTYLE CHOICE/WEIGHT GAIN/MILD Non-erosive gastritis   ESOPHAGOGASTRODUODENOSCOPY (EGD) WITH PROPOFOL N/A 03/02/2016   Dr. Oneida Alar: Esophagus appeared normal, impaired dilation performed, patchy inflammation with edema and erythema of the entire stomach., Biopsy with H pylori, patient completed Pylera.    ESOPHAGOGASTRODUODENOSCOPY (EGD) WITH PROPOFOL N/A 12/04/2019   Surgeon: Eloise Harman, DO; benign appearing esophageal stenosis dilated, gastritis biopsied (mild reactive gastropathy, gastritis, negative H. pylori), normal duodenum.   FRACTURE SURGERY     LEFT HEART CATHETERIZATION WITH CORONARY ANGIOGRAM N/A 08/31/2011   Procedure: LEFT HEART CATHETERIZATION WITH CORONARY ANGIOGRAM;  Surgeon: Laverda Page, MD;  Location: [facility] CATH LAB;  Service: Cardiovascular;  Laterality: N/A;   LUMBAR DISC SURGERY     "L4-5; Dr. Trenton Gammon"   LUMBAR LAMINECTOMY/DECOMPRESSION MICRODISCECTOMY Right 03/06/2019   Procedure: MICRODISCECTOMY EXTRAFORAMINAL LUMBAR THREE - LUMBAR FOUR RIGHT;  Surgeon: Earnie Larsson, MD;  Location: [place];  Service: Neurosurgery;  Laterality: Right;  MICRODISCECTOMY EXTRAFORAMINAL LUMBAR THREE - LUMBAR FOUR RIGHT   MULTIPLE TOOTH EXTRACTIONS     ORIF MANDIBULAR FRACTURE N/A 12/19/2015   Procedure: OPEN REDUCTION INTERNAL FIXATION (ORIF) MANDIBULAR FRACTURE;  Surgeon: Izora Gala, MD;  Location: [place];  Service: ENT;  Laterality: N/A;   PATELLA FRACTURE SURGERY Left 1976   plate to knee cap from accident   POLYPECTOMY  07/05/2017   Procedure: POLYPECTOMY;  Surgeon: Danie Binder, MD;  [facility];  Service: Endoscopy;;  colon   SAVORY DILATION  12/28/2010   SLF:(MAC)J-shaped stomach/nodular mocosa in the distal esophagus/empiric dilation 45m   SAVORY DILATION N/A 03/02/2016   Procedure: SAVORY DILATION;  Surgeon: SDanie Binder MD;  [facility];  Service: Endoscopy;  Laterality: N/A;    Allergies  Allergies  Allergen Reactions    Gabapentin Hives   Ibuprofen Other (See Comments)    Stomach  Upset and stomach ulcers   Zolpidem Tartrate Other (See Comments)    Hallucinations    Naproxen Other (See Comments)    HALLUCINATIONS    History of Present Illness    Dalton [name] is a 62year old male with the above mention past medical history who presents today for surgical clearance.  Dalton [name] a PMH of CABG x 1 in 2000 and subsequent LHC performed in 2013 that showed normal coronaries with a atretic LIMA and patent left subclavian stent.  He had a subsequent nuclear stress test in 2017 that was low risk with no ischemia.  Dalton [name] initially seen by Dr.End in 2018 for complaint of chest pain.  He was placed on Imdur to cover for possible microvascular function and no further ischemic evaluation were completed due to previous negative evaluations.  He was seen on 05/2017 with complaint of lower extremity swelling and dyspnea on exertion with occasional chest discomfort when lying down.  2D echo was completed and showed normal LVEF without any significant abnormalities.  He was started on Lasix 40 mg daily.  2D echo was completed 09/2019 showing grade 1 DD and EF of 60 to 65%.  He also had subsequent coronary CTA completed that showed calcium score 0 and no evidence of CAD.  He was last seen by Robbie Lis, PA on 05/14/2021 with complaint of shortness of breath, chest pain and palpitations.  Lexiscan Myoview was completed and showed no ischemia.  ZIO monitor was also 1 that shows sinus rhythm with nonsustained symptomatic SVT.  He was seen in follow-up and continued to have shortness of breath and occasional chest tightness.  ETT was ordered however patient walks with a cane and was unable to complete test.  He was evaluated for sleep apnea and was found to have mild obstructive sleep apnea and was titrated for CPAP.  Dalton [name] presents today alone for preoperative clearance.  Since last being seen in the office  patient reports that he has been experiencing bouts of chest pain and shortness of breath with occasional palpitations.  He also has been experiencing a cough that is productive with his chest discomfort.  He reports that he feels very exhausted and has slight chest pain with ambulation.  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  He is currently using Coricidin cough syrup and Tessalon Perles with little relief.  He uses as needed nitroglycerin with some relief with chest discomfort.  His blood pressure today is well-controlled at 110/70 and heart rate was 75 bpm.  In regard to surgical clearance we will send him for a [place] for further restratification.  Patient denies , palpitations, dyspnea, PND, orthopnea, nausea, vomiting, dizziness, syncope, edema, weight gain, or early satiety.   Home Medications    Current Outpatient Medications  Medication Sig Dispense Refill   albuterol (PROVENTIL HFA;VENTOLIN HFA) 108 (90 Base) MCG/ACT inhaler Inhale 2 puffs into the lungs every 6 (six) hours as needed for wheezing or shortness of breath.      albuterol (PROVENTIL) (2.5 MG/3ML) 0.083% nebulizer solution Take 2.5 mg by nebulization every 6 (six) hours as needed for wheezing or shortness of breath.      ALPRAZolam (XANAX) 1 MG tablet Take 1 mg by mouth 3 (three) times daily.      amLODipine (NORVASC) 5 MG tablet TAKE 1 TABLET(5 MG) BY MOUTH DAILY (Patient taking differently: Take 5 mg by mouth daily.) 7 tablet 0   atorvastatin (LIPITOR) 80 MG tablet Take 80 mg by mouth at bedtime.     azelastine (ASTELIN) 0.1 % nasal spray Place 1 spray into both nostrils 2 (two) times daily.     beclomethasone (QVAR) 80 MCG/ACT inhaler Inhale 2 puffs into the lungs 2 (two) times daily as needed (respiratory issues.).      BELSOMRA 15 MG TABS Take 1 tablet by mouth at bedtime.     benzonatate (TESSALON) 100 MG capsule Take 1 capsule (100 mg total) by mouth 3  (three) times daily as needed for cough. 21 capsule 0   brimonidine (ALPHAGAN P) 0.1 % SOLN Place 1 drop into the right eye 2 (two) times daily.     cetirizine (ZYRTEC) 10 MG tablet Take 10 mg by mouth daily as needed for allergies.   0   clopidogrel (PLAVIX) 75 MG tablet Take 75 mg by mouth daily.     dexlansoprazole (DEXILANT) 60 MG  capsule Take 1 capsule (60 mg total) by mouth daily. 30 capsule 5   doxycycline (VIBRA-TABS) 100 MG tablet Take 100 mg by mouth 2 (two) times daily. (Patient not taking: Reported on 01/02/2022)     Evolocumab (REPATHA SURECLICK) [id number] MG/ML SOAJ Take 140 mg by mouth every 14 (fourteen) days. 2 mL 11   ezetimibe (ZETIA) 10 MG tablet Take 10 mg by mouth daily.     famotidine (PEPCID) 20 MG tablet Take 20 mg by mouth at bedtime.     fluticasone (FLONASE) 50 MCG/ACT nasal spray Place 2 sprays into both nostrils daily.     Fluticasone Furoate (ARNUITY ELLIPTA) 200 MCG/ACT AEPB Inhale 1 puff into the lungs daily as needed (shortness of breath).      furosemide (LASIX) 40 MG tablet TAKE 1 TABLET(40 MG) BY MOUTH DAILY (Patient taking differently: Take 40 mg by mouth daily.) 90 tablet 3   glipiZIDE (GLUCOTROL) 10 MG tablet Take 10 mg by mouth daily before breakfast.     ipratropium (ATROVENT) 0.02 % nebulizer solution Take 0.5 mg by nebulization 2 (two) times daily as needed for wheezing or shortness of breath (for congestion).      isosorbide mononitrate (IMDUR) 30 MG 24 hr tablet Take 30 mg by mouth daily.     ketoconazole (NIZORAL) 2 % shampoo Apply 1 application topically daily as needed for irritation.      latanoprost (XALATAN) 0.005 % ophthalmic solution Place 1 drop into both eyes at bedtime.     LINZESS 290 MCG CAPS capsule TAKE 1 CAPSULE(290 MCG) BY MOUTH DAILY BEFORE BREAKFAST (Patient taking differently: Take 290 mcg by mouth daily before breakfast. May take 1 additional capsule as needed for constipation) 90 capsule 3   lisinopril-hydrochlorothiazide (ZESTORETIC)  10-12.5 MG tablet Take 1 tablet by mouth daily.     magnesium citrate SOLN Take 1 Bottle by mouth as needed for moderate constipation.      MEDROL 4 MG TBPK tablet See admin instructions. (Patient not taking: Reported on 01/02/2022)     metFORMIN (GLUCOPHAGE-XR) 500 MG 24 hr tablet Take 500 mg by mouth daily with breakfast.      methocarbamol (ROBAXIN) 500 MG tablet Take 1 tablet (500 mg total) by mouth every 6 (six) hours as needed. 10 tablet 0   metoprolol tartrate (LOPRESSOR) 50 MG tablet TAKE 1 TABLET(50 MG) BY MOUTH TWICE DAILY 180 tablet 3   Multiple Vitamin (MULTIVITAMIN WITH MINERALS) TABS tablet Take 1 tablet by mouth daily.     naloxegol oxalate (MOVANTIK) 25 MG TABS tablet Take 1 tablet (25 mg total) by mouth daily. 30 tablet 5   NARCAN 4 MG/0.1ML LIQD nasal spray kit Place 1 spray into the nose as needed (opioid overdose).   0   nitroGLYCERIN (NITROSTAT) 0.4 MG SL tablet Place 1 tablet (0.4 mg total) under the tongue every 5 (five) minutes as needed for chest pain.     oxyCODONE-acetaminophen (PERCOCET) 10-325 MG tablet Take 1 tablet by mouth every 4 (four) hours as needed for pain.     potassium chloride SA (KLOR-CON) 20 MEQ tablet Take 20 mEq by mouth 2 (two) times daily.     predniSONE (DELTASONE) 10 MG tablet Take 40 mg by mouth daily.     sertraline (ZOLOFT) 100 MG tablet Take 100 mg by mouth daily.     sitaGLIPtin (JANUVIA) 100 MG tablet Take 100 mg by mouth daily.     sucralfate (CARAFATE) 1 GM/10ML suspension in the morning, at noon, in the  evening, and at bedtime.     tamsulosin (FLOMAX) 0.4 MG CAPS capsule Take 1 capsule (0.4 mg total) by mouth daily after breakfast. 30 capsule 0   Vitamin D, Ergocalciferol, (DRISDOL) 1.25 MG (50000 UT) CAPS capsule Take 50,000 Units by mouth every 7 (seven) days. Monday     No current facility-administered medications for this visit.     Review of Systems  Please see the history of present illness.    (+) Chest pain, shortness of  breath (+) Palpitations  All other systems reviewed and are otherwise negative except as noted above.  Physical Exam    Wt Readings from Last 3 Encounters:  11/24/21 255 lb (115.7 kg)  10/14/21 280 lb 9.6 oz (127.3 kg)  08/26/21 280 lb 11.2 oz (127.3 kg)   [id number] were no vitals filed for this visit.,There is no height or weight on file to calculate BMI.  Constitutional:      Appearance: Healthy appearance. Not in distress.  Neck:     Vascular: JVD normal.  Pulmonary:     Effort: Pulmonary effort is normal.     Breath sounds: No wheezing. No rales. Diminished in the bases Cardiovascular:     Normal rate. Regular rhythm. Normal S1. Normal S2.      Murmurs: There is no murmur.  Edema:    Peripheral edema absent.  Abdominal:     Palpations: Abdomen is soft non tender. There is no hepatomegaly.  Skin:    General: Skin is warm and dry.  Neurological:     General: No focal deficit present.     Mental Status: Alert and oriented to person, place and time.     Cranial Nerves: Cranial nerves are intact.  EKG/LABS/Other Studies Reviewed    ECG personally reviewed by me today -sinus rhythm with PACs and rate of 75 bpm with no acute changes consistent with previous EKG.   STOP-Bang Score:  7        Lab Results  Component Value Date   WBC 5.9 03/22/2022   HGB 12.8 (L) 03/22/2022   HCT 36.8 (L) 03/22/2022   MCV 84.2 03/22/2022   PLT 256 03/22/2022   Lab Results  Component Value Date   CREATININE 0.76 03/22/2022   BUN 6 (L) 03/22/2022   NA 141 03/22/2022   K 4.0 03/22/2022   CL 104 03/22/2022   CO2 26 03/22/2022   Lab Results  Component Value Date   ALT 31 03/22/2022   AST 27 03/22/2022   ALKPHOS 100 01/01/2022   BILITOT 0.5 03/22/2022   Lab Results  Component Value Date   CHOL 90 03/22/2022   HDL 48 03/22/2022   LDLCALC 17 03/22/2022   TRIG 178 (H) 03/22/2022   CHOLHDL 1.9 03/22/2022    Lab Results  Component Value Date   HGBA1C 5.9 (H) 08/25/2021     Assessment & Plan    1.  Surgical clearance:  -Addendum: -Lexiscan Myoview was completed and showed no evidence of ischemia or infarct.  His test was deemed low risk and patient is fine to proceed with his scheduled procedure.  He was able to complete greater than 4 METS of activity without any difficulty.  -Patient is currently experiencing stable angina with progressive shortness of breath and palpitations.  We will not grant surgical clearance today and have him complete a Lexiscan Myoview for further restratification.  He was advised to resume his Plavix 75 mg today and also we will increase his Imdur to 60 mg  daily.   Dalton [name] perioperative risk of a major cardiac event is  % according to the Revised Cardiac Risk Index (RCRI).  Therefore, he is at low risk for perioperative complications.   His functional capacity is fair at 5.93 METs according to the Duke Activity Status Index (DASI). Recommendations: According to ACC/AHA guidelines, no further cardiovascular testing needed.  The patient may proceed to surgery at acceptable risk.   Antiplatelet and/or Anticoagulation Recommendations: Clopidogrel (Plavix) can be held for 5 days prior to his surgery and resumed as soon as possible post op.  Regarding ASA therapy, we recommend continuation of ASA throughout the perioperative period.  However, if the surgeon feels that cessation of ASA is required in the perioperative period, it may be stopped 5-7 days prior to surgery with a plan to resume it as soon as felt to be feasible from a surgical standpoint in the post-operative period.    2.  Coronary artery disease: -History of CABG x 1 in 2002 with Lexiscan Myoview completed 05/2021 with no evidence of ischemia -Today patient reports ongoing chest pain with shortness of breath and palpitations. -We will arrange for [facility] as noted above and he will increase Imdur to 60 mg daily -Continue current GDMT with Plavix 75 mg daily,  Lipitor 80 mg daily, ezetimibe 10 mg daily, Repatha 140 mg q. 14 days and Lopressor 50 mg daily  3.  Essential hypertension: -Patient's blood pressure today is well-controlled at 110/70 -Continue Norvasc 5 mg daily, metoprolol 50 mg daily  4.  HFpEF: -Most recent 2D echo completed 10/2021 with EF of 60-65%, no RWMA, with trivial TVR and MVR  -Today patient reports occasional episodes of shortness of breath and trace amount of lower extremity edema today. -Patient will continue Lasix 40 mg with additional as needed dose of 40 mg as needed for weight gain of 2 pounds 24 hours and 5 pounds in 1 week. -Low sodium diet, fluid restriction <2L, and daily weights encouraged. Educated to contact our office for weight gain of 2 lbs overnight or 5 lbs in one week.   5.  Obstructive sleep apnea: -Patient reports compliance with his CPAP machine.   Disposition: Follow-up with Sinclair Grooms, MD (Inactive) or APP in 1 months Shared Decision Making/Informed Consent The risks [chest pain, shortness of breath, cardiac arrhythmias, dizziness, blood pressure fluctuations, myocardial infarction, stroke/transient ischemic attack, nausea, vomiting, allergic reaction, radiation exposure, metallic taste sensation and life-threatening complications (estimated to be 1 in 10,000)], benefits (risk stratification, diagnosing coronary artery disease, treatment guidance) and alternatives of a nuclear stress test were discussed in detail with Dalton [name] and he agrees to proceed.   Medication Adjustments/Labs and Tests Ordered: Current medicines are reviewed at length with the patient today.  Concerns regarding medicines are outlined above.   Signed, Mable Fill, Marissa Nestle, NP 04/12/2022, 10:57 AM [facility]  Note:  This document was prepared using Dragon voice recognition software and may include unintentional dictation errors.

## 2022-04-13 ENCOUNTER — Telehealth (HOSPITAL_COMMUNITY): Payer: Self-pay | Admitting: Radiology

## 2022-04-13 ENCOUNTER — Ambulatory Visit: Payer: 59 | Attending: Nurse Practitioner | Admitting: Nurse Practitioner

## 2022-04-13 ENCOUNTER — Encounter: Payer: Self-pay | Admitting: Nurse Practitioner

## 2022-04-13 VITALS — BP 110/70 | HR 75 | Ht 70.0 in | Wt 283.0 lb

## 2022-04-13 DIAGNOSIS — Z01810 Encounter for preprocedural cardiovascular examination: Secondary | ICD-10-CM | POA: Diagnosis not present

## 2022-04-13 DIAGNOSIS — I10 Essential (primary) hypertension: Secondary | ICD-10-CM

## 2022-04-13 DIAGNOSIS — I5032 Chronic diastolic (congestive) heart failure: Secondary | ICD-10-CM

## 2022-04-13 DIAGNOSIS — G4733 Obstructive sleep apnea (adult) (pediatric): Secondary | ICD-10-CM

## 2022-04-13 DIAGNOSIS — I25700 Atherosclerosis of coronary artery bypass graft(s), unspecified, with unstable angina pectoris: Secondary | ICD-10-CM | POA: Diagnosis not present

## 2022-04-13 MED ORDER — FUROSEMIDE 40 MG PO TABS: 40.0000 mg | ORAL_TABLET | Freq: Every day | ORAL | 1 refills | Status: DC

## 2022-04-13 MED ORDER — ISOSORBIDE MONONITRATE ER 60 MG PO TB24: 60.0000 mg | ORAL_TABLET | Freq: Every day | ORAL | 1 refills | Status: DC

## 2022-04-13 NOTE — Addendum Note (Signed)
Addended by: Tempie Donning on: 04/13/2022 12:49 PM   Modules accepted: Orders

## 2022-04-13 NOTE — Telephone Encounter (Signed)
Patient given detailed instructions per Myocardial Perfusion Study Information Sheet for the test on 2/26 at 8:15. Patient notified to arrive 15 minutes early and that it is imperative to arrive on time for appointment to keep from having the test rescheduled.  If you need to cancel or reschedule your appointment, please call the office within 24 hours of your appointment. . Patient verbalized understanding.EHK

## 2022-04-13 NOTE — Patient Instructions (Addendum)
1Medication Instructions:  INCREASE Imdur to 75m Once a day  RESUME Plavix 1 tab daily CHANGE you can take an additional 422mof LAsix as needed for swelling or weight gain of 2lbs in a day or 5lbs in a week  *If you need a refill on your cardiac medications before your next appointment, please call your pharmacy*   Lab Work: None Ordered   Testing/Procedures: Your physician has requested that you have a lexiscan myoview. For further information please visit [email or web address] follow instruction sheet, as given.  A chest x-ray takes a picture of the organs and structures inside the chest, including the heart, lungs, and blood vessels. This test can show several things, including, whether the heart is enlarges; whether fluid is building up in the lungs; and whether pacemaker / defibrillator leads are still in place. YOU CAN COMPLETE THIS TEST AT [facility] [address]S[place]NO APPOINTMENT IS NEEDED  ALLOW WALK-INS & MUST COMPLETE BEFORE 5PM   Follow-Up: [facility] needs are our priority.  As part of our continuing mission to provide you with exceptional heart care, we have created designated Provider Care Teams.  These Care Teams include your primary Cardiologist (physician) and Advanced Practice Providers (APPs -  Physician Assistants and Nurse Practitioners) who all work together to provide you with the care you need, when you need it.  We recommend signing up for the patient portal called "MyChart".  Sign up information is provided on this After Visit Summary.  MyChart is used to connect with patients for Virtual Visits (Telemedicine).  Patients are able to view lab/test results, encounter notes, upcoming appointments, etc.  Non-urgent messages can be sent to your provider as well.   To learn more about what you can do with MyChart, go to [email or web address]   Your next appointment:   1  month(s)  Provider:   ErAmbrose PancoastNP        HeGwyndolyn KaufmanMD MaRudean HaskellMD ArLenna SciaraMD  Other Instructions

## 2022-04-19 ENCOUNTER — Ambulatory Visit (HOSPITAL_COMMUNITY): Payer: 59 | Attending: Cardiology

## 2022-04-19 DIAGNOSIS — I5032 Chronic diastolic (congestive) heart failure: Secondary | ICD-10-CM | POA: Diagnosis not present

## 2022-04-19 DIAGNOSIS — Z01810 Encounter for preprocedural cardiovascular examination: Secondary | ICD-10-CM | POA: Diagnosis present

## 2022-04-19 DIAGNOSIS — I10 Essential (primary) hypertension: Secondary | ICD-10-CM | POA: Insufficient documentation

## 2022-04-19 DIAGNOSIS — G4733 Obstructive sleep apnea (adult) (pediatric): Secondary | ICD-10-CM | POA: Insufficient documentation

## 2022-04-19 DIAGNOSIS — I25700 Atherosclerosis of coronary artery bypass graft(s), unspecified, with unstable angina pectoris: Secondary | ICD-10-CM | POA: Insufficient documentation

## 2022-04-19 MED ORDER — REGADENOSON 0.4 MG/5ML IV SOLN
0.4000 mg | Freq: Once | INTRAVENOUS | Status: AC
  Administered 2022-04-19: 0.4 mg via INTRAVENOUS

## 2022-04-19 MED ORDER — TECHNETIUM TC 99M TETROFOSMIN IV KIT
31.8000 | PACK | Freq: Once | INTRAVENOUS | Status: AC | PRN
  Administered 2022-04-19: 31.8 via INTRAVENOUS

## 2022-04-20 ENCOUNTER — Ambulatory Visit (HOSPITAL_COMMUNITY): Payer: 59 | Attending: Cardiology

## 2022-04-20 LAB — MYOCARDIAL PERFUSION IMAGING
LV dias vol: 79 mL (ref 62–150)
LV sys vol: 42 mL
Nuc Stress EF: 47 %
Peak HR: 97 {beats}/min
Rest HR: 93 {beats}/min
Rest Nuclear Isotope Dose: 29.3 mCi
SDS: 2
SRS: 0
SSS: 2
ST Depression (mm): 0 mm
Stress Nuclear Isotope Dose: 31.8 mCi
TID: 1.02

## 2022-04-20 MED ORDER — TECHNETIUM TC 99M TETROFOSMIN IV KIT
29.3000 | PACK | Freq: Once | INTRAVENOUS | Status: AC | PRN
  Administered 2022-04-20: 29.3 via INTRAVENOUS

## 2022-04-21 ENCOUNTER — Ambulatory Visit: Payer: 59 | Admitting: Podiatry

## 2022-05-10 NOTE — Progress Notes (Signed)
Office Visit    Patient Name: Dalton [name] Date of Encounter: 05/10/2022  Primary Care Provider:  Fleet Contras, MD Primary Cardiologist:  None Primary Electrophysiologist: None  Chief Complaint    Dalton [name] is a 62 y.o. male with PMH of CAD s/p CABG x 1 in 2002 (LIMA to LAD), HFpEF, HTN, HLD, DM II, rheumatoid arthritis, DVT, bipolar disorder, peptic ulcer disease, left subclavian stent, past cocaine abuse who presents today for 1 month follow-up.  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)     last seizure in 2011;unknown etiology-pt sts heriditary (12/24/2015)   FAOZHYQM(578.4)    "a few times/week" (12/24/2015)   High cholesterol    HNP (herniated nucleus pulposus), cervical    Hypertension    Laceration of right hand 11/27/2010   Laceration of wrist 2007 BIL FOREARMS   MI (myocardial infarction) (HCC)    7, last one was in 2011 (12/24/2015)   Neuromuscular disorder (HCC)    legs/feet   Neuropathy    legs/feet   NSAID-induced gastric ulcer    "Ibuprofen"   PUD (peptic ulcer disease)    in 1990s, secondary to medication   Rheumatoid arthritis (HCC)    "all over" (12/24/2015)   Shortness of breath    with exertion   Tonsillitis, chronic    Dr. [name] in [place]   Type II diabetes mellitus (HCC)    Wears glasses    Past Surgical History:  Procedure Laterality Date   ANTERIOR CERVICAL DECOMP/DISCECTOMY FUSION N/A  11/05/2016   Procedure: Anterior Cervical Discectomy and Fusion - Cervical three-Cervical four;  Surgeon: Julio Sicks, MD;  Location: [facility] OR;  Service: Neurosurgery;  Laterality: N/A;   BACK SURGERY     x3   BALLOON DILATION N/A 12/04/2019   Procedure: BALLOON DILATION;  Surgeon: Lanelle Bal, DO;  Location: [facility];  Service: Endoscopy;  Laterality: N/A;   BIOPSY N/A 05/30/2012   Procedure: BIOPSY;  Surgeon: West Bali, MD;  Location: [facility] ORS;  Service: Endoscopy;  Laterality: N/A;   BIOPSY  03/02/2016   Procedure: BIOPSY;  Surgeon: West Bali, MD;  Location: [facility];  Service: Endoscopy;;  gastric   BIOPSY  12/04/2019   Procedure: BIOPSY;  Surgeon: Lanelle Bal, DO;  Location: [facility];  Service: Endoscopy;;   CARDIAC CATHETERIZATION  "several"   CARPAL TUNNEL RELEASE Bilateral    CATARACT EXTRACTION W/PHACO Right 06/15/2016   Procedure: CATARACT EXTRACTION PHACO AND INTRAOCULAR LENS PLACEMENT (IOC);  Surgeon: Jethro Bolus, MD;  Location: [facility];  Service: Ophthalmology;  Laterality: Right;  CDE: 4.64   CATARACT EXTRACTION W/PHACO Left 07/13/2016   Procedure: CATARACT EXTRACTION PHACO AND INTRAOCULAR LENS PLACEMENT (IOC);  Surgeon: Jethro Bolus, MD;  Location: [facility];  Service: Ophthalmology;  Laterality: Left;  CDE: 3.15   COLONOSCOPY  12/28/2010   SLF: (MAC)Internal hemorrhoids/four small colon polyps tubular adenomas. per SLF: colonoscopy 2022   COLONOSCOPY WITH PROPOFOL N/A 07/05/2017   Surgeon: West Bali, MD; tubular adenoma, hyperplastic polyp, submucosal nodule at hepatic flexure consistent with lipoma on biopsy, diverticulosis and rectosigmoid and sigmoid colon, rectal bleeding due to large internal hemorrhoids.  Recommended repeat in 3 years due to history of polyps and oily film on scope.   CORONARY ARTERY BYPASS GRAFT  2002   3 vessels   ESOPHAGOGASTRODUODENOSCOPY N/A 05/30/2012   SLF: UNCONTROLLED GERD DUE TO LIFESTYLE CHOICE/WEIGHT  GAIN/MILD Non-erosive gastritis   ESOPHAGOGASTRODUODENOSCOPY (EGD) WITH PROPOFOL N/A 03/02/2016   Dr. [name]: Esophagus appeared normal, impaired dilation performed, patchy inflammation with edema and erythema of the entire stomach., Biopsy with H pylori, patient completed Pylera.    ESOPHAGOGASTRODUODENOSCOPY (EGD) WITH PROPOFOL N/A 12/04/2019   Surgeon: Lanelle Bal, DO; benign appearing esophageal stenosis dilated, gastritis biopsied (mild reactive gastropathy, gastritis, negative H. pylori), normal duodenum.   FRACTURE SURGERY     LEFT HEART CATHETERIZATION WITH CORONARY ANGIOGRAM N/A 08/31/2011   Procedure: LEFT HEART CATHETERIZATION WITH CORONARY ANGIOGRAM;  Surgeon: [name], MD;  Location: [facility] CATH LAB;  Service: Cardiovascular;  Laterality: N/A;   LUMBAR DISC SURGERY     "L4-5; Dr. Dutch Quint"   LUMBAR LAMINECTOMY/DECOMPRESSION MICRODISCECTOMY Right 03/06/2019   Procedure: MICRODISCECTOMY EXTRAFORAMINAL LUMBAR THREE - LUMBAR FOUR RIGHT;  Surgeon: Julio Sicks, MD;  Location: [facility] OR;  Service: Neurosurgery;  Laterality: Right;  MICRODISCECTOMY EXTRAFORAMINAL LUMBAR THREE - LUMBAR FOUR RIGHT   MULTIPLE TOOTH EXTRACTIONS     ORIF MANDIBULAR FRACTURE N/A 12/19/2015   Procedure: OPEN REDUCTION INTERNAL FIXATION (ORIF) MANDIBULAR FRACTURE;  Surgeon: [name], MD;  Location: [facility] OR;  Service: ENT;  Laterality: N/A;   PATELLA FRACTURE SURGERY Left 1976   plate to knee cap from accident   POLYPECTOMY  07/05/2017   Procedure: POLYPECTOMY;  Surgeon: West Bali, MD;  [facility]: [facility];  Service: Endoscopy;;  colon   SAVORY DILATION  12/28/2010   SLF:(MAC)J-shaped stomach/nodular mocosa in the distal esophagus/empiric dilation 16mm   SAVORY DILATION N/A 03/02/2016   Procedure: SAVORY DILATION;  Surgeon: West Bali, MD;  Location: [facility];  Service: Endoscopy;  Laterality: N/A;    Allergies  Allergies  Allergen Reactions   Gabapentin Hives   Ibuprofen Other (See  Comments)    Stomach  Upset and stomach ulcers   Zolpidem Tartrate     Other Reaction(s): Not available   Naproxen Other (See Comments)    HALLUCINATIONS    History of Present Illness    Dalton [name]  is a 62 year old male with the above mention past medical history who presents today for 1 month follow-up of congestive heart failure.  Mr. Lipe has a PMH of CABG x 1 in 2000 and subsequent LHC performed in 2013 that showed normal coronaries with a atretic LIMA and patent left subclavian stent.  He had a subsequent nuclear stress test in 2017 that was low risk with no ischemia.  Mr. Dittmar was initially seen by Dr.End in 2018 for complaint of chest pain.  He was placed on Imdur to cover for possible microvascular function and no further ischemic evaluation were completed due to previous negative evaluations.  He was seen on 05/2017 with complaint of lower extremity swelling and dyspnea on exertion with occasional chest discomfort when lying down.  2D echo was completed and showed normal LVEF without any significant abnormalities.  He was started on Lasix 40 mg daily.  2D echo was completed 09/2019 showing grade 1 DD and EF of 60 to 65%.  He also had subsequent coronary CTA completed that showed calcium score 0 and no evidence of CAD.  He was last seen by Chelsea Aus, PA on 05/14/2021 with complaint of shortness of breath, chest pain and palpitations.  Lexiscan Myoview was completed and showed no ischemia.  ZIO monitor was also 1 that shows sinus rhythm with nonsustained symptomatic SVT.  He was seen in follow-up and continued to have shortness of breath and occasional chest tightness.  ETT was ordered however patient walks with a cane and was unable to complete test.  He was evaluated for sleep apnea and was found to have mild obstructive sleep apnea and was titrated for CPAP.  He was seen on 04/13/2022 for preoperative clearance visit.  During visit patient reported experiencing bouts of chest pain  with shortness of breath and occasional palpitations.  He was also recovering from a upper respiratory infection. Prior to surgical clearance being offered patient was sent for [facility] for further restratification. Repeat stress test showed no evidence of ischemia or decreased blood flow however did show decreased LV function.  We will have him repeat 2D echo for further evaluation of decreased LV function.   Since last being seen in the office patient reports he has been doing relatively the same since his previous visit.  He was able to complete his carpal tunnel surgery on his right wrist.  He tolerated procedure without any complications.  He is still endorsing chest pain that occurs with or without activity along with shortness of breath.  He has trace lower extremity edema on exam.  He is otherwise euvolemic on exam.  His blood pressures today are elevated at 148/82 and were 142/84 on recheck.  He reports an increased amount of stress recently and feels overwhelmed at times with his regimen of doctor visits and medications.  During our visit we reviewed his Lexiscan report and discussed his findings in detail.  We discussed the decreased LV function that was noted on his exam reviewed the need for repeat testing for further evaluation.  Patient denies, palpitations, dyspnea, PND, orthopnea, nausea, vomiting, dizziness, syncope, edema, weight gain, or early satiety.    Home Medications    Current Outpatient Medications  Medication Sig Dispense Refill   albuterol (PROVENTIL HFA;VENTOLIN HFA) 108 (90 Base) MCG/ACT inhaler Inhale 2 puffs into the lungs every 6 (six) hours as needed for wheezing or shortness of breath.      albuterol (PROVENTIL) (2.5 MG/3ML) 0.083% nebulizer solution Take 2.5 mg by nebulization every 6 (six) hours as needed for wheezing or shortness of breath.      ALPRAZolam (XANAX) 1 MG tablet Take 1 mg by mouth 3 (three) times daily.      amLODipine (NORVASC) 5 MG tablet  TAKE 1 TABLET(5 MG) BY MOUTH DAILY 7 tablet 0   atorvastatin (LIPITOR) 80 MG tablet Take 80 mg by mouth at bedtime.     azelastine (ASTELIN) 0.1 % nasal spray Place 1 spray into both nostrils 2 (two) times daily.     beclomethasone (QVAR) 80 MCG/ACT inhaler Inhale 2 puffs into the lungs 2 (two) times daily as needed (respiratory issues.).      BELSOMRA 15 MG TABS Take 1 tablet by mouth at bedtime.     benzonatate (TESSALON) 100  MG capsule Take 1 capsule (100 mg total) by mouth 3 (three) times daily as needed for cough. 21 capsule 0   brimonidine (ALPHAGAN P) 0.1 % SOLN Place 1 drop into the right eye 2 (two) times daily.     cetirizine (ZYRTEC) 10 MG tablet Take 10 mg by mouth daily as needed for allergies.   0   clopidogrel (PLAVIX) 75 MG tablet Take 75 mg by mouth daily.     dexlansoprazole (DEXILANT) 60 MG capsule Take 1 capsule (60 mg total) by mouth daily. 30 capsule 5   Evolocumab (REPATHA SURECLICK) 140 MG/ML SOAJ Take 140 mg by mouth every 14 (fourteen) days. 2 mL 11   ezetimibe (ZETIA) 10 MG tablet Take 10 mg by mouth daily.     famotidine (PEPCID) 20 MG tablet Take 20 mg by mouth at bedtime.     fluticasone (FLONASE) 50 MCG/ACT nasal spray Place 2 sprays into both nostrils daily.     Fluticasone Furoate (ARNUITY ELLIPTA) 200 MCG/ACT AEPB Inhale 1 puff into the lungs daily as needed (shortness of breath).      furosemide (LASIX) 40 MG tablet Take 1 tablet (40 mg total) by mouth daily. Can take an additional 40mg  tab as needed for swelling or weight gain 100 tablet 1   glipiZIDE (GLUCOTROL) 10 MG tablet Take 10 mg by mouth daily before breakfast.     ipratropium (ATROVENT) 0.02 % nebulizer solution Take 0.5 mg by nebulization 2 (two) times daily as needed for wheezing or shortness of breath (for congestion).      isosorbide mononitrate (IMDUR) 60 MG 24 hr tablet Take 1 tablet (60 mg total) by mouth daily. 90 tablet 1   ketoconazole (NIZORAL) 2 % shampoo Apply 1 application topically daily  as needed for irritation.      latanoprost (XALATAN) 0.005 % ophthalmic solution Place 1 drop into both eyes at bedtime.     LINZESS 290 MCG CAPS capsule TAKE 1 CAPSULE(290 MCG) BY MOUTH DAILY BEFORE BREAKFAST (Patient taking differently: Take 290 mcg by mouth daily before breakfast. May take 1 additional capsule as needed for constipation) 90 capsule 3   lisinopril-hydrochlorothiazide (ZESTORETIC) 10-12.5 MG tablet Take 1 tablet by mouth daily.     magnesium citrate SOLN Take 1 Bottle by mouth as needed for moderate constipation.      metFORMIN (GLUCOPHAGE-XR) 500 MG 24 hr tablet Take 500 mg by mouth daily with breakfast.      methocarbamol (ROBAXIN) 500 MG tablet Take 1 tablet (500 mg total) by mouth every 6 (six) hours as needed. 10 tablet 0   metoprolol tartrate (LOPRESSOR) 50 MG tablet TAKE 1 TABLET(50 MG) BY MOUTH TWICE DAILY 180 tablet 3   Multiple Vitamin (MULTIVITAMIN WITH MINERALS) TABS tablet Take 1 tablet by mouth daily.     naloxegol oxalate (MOVANTIK) 25 MG TABS tablet Take 1 tablet (25 mg total) by mouth daily. 30 tablet 5   NARCAN 4 MG/0.1ML LIQD nasal spray kit Place 1 spray into the nose as needed (opioid overdose).   0   nitroGLYCERIN (NITROSTAT) 0.4 MG SL tablet Place 1 tablet (0.4 mg total) under the tongue every 5 (five) minutes as needed for chest pain.     oxyCODONE-acetaminophen (PERCOCET) 10-325 MG tablet Take 1 tablet by mouth every 4 (four) hours as needed for pain.     potassium chloride SA (KLOR-CON) 20 MEQ tablet Take 20 mEq by mouth 2 (two) times daily.     predniSONE (DELTASONE) 10 MG tablet Take  40 mg by mouth daily.     sertraline (ZOLOFT) 100 MG tablet Take 100 mg by mouth daily.     sildenafil (VIAGRA) 100 MG tablet Take by mouth as needed for erectile dysfunction.     sitaGLIPtin (JANUVIA) 100 MG tablet Take 100 mg by mouth daily.     sucralfate (CARAFATE) 1 GM/10ML suspension in the morning, at noon, in the evening, and at bedtime.     tamsulosin (FLOMAX) 0.4  MG CAPS capsule Take 1 capsule (0.4 mg total) by mouth daily after breakfast. 30 capsule 0   Vitamin D, Ergocalciferol, (DRISDOL) 1.25 MG (50000 UT) CAPS capsule Take 50,000 Units by mouth every 7 (seven) days. Monday     No current facility-administered medications for this visit.     Review of Systems  Please see the history of present illness.    (+) Chest discomfort (+) Shortness of breath, increased anxiety  All other systems reviewed and are otherwise negative except as noted above.  Physical Exam    Wt Readings from Last 3 Encounters:  04/19/22 283 lb (128.4 kg)  04/13/22 283 lb (128.4 kg)  11/24/21 255 lb (115.7 kg)   WG:NFAOZ were no vitals filed for this visit.,There is no height or weight on file to calculate BMI.  Constitutional:      Appearance: Healthy appearance. Not in distress.  Neck:     Vascular: JVD normal.  Pulmonary:     Effort: Pulmonary effort is normal.     Breath sounds: No wheezing. No rales. Diminished in the bases Cardiovascular:     Normal rate. Regular rhythm. Normal S1. Normal S2.      Murmurs: There is no murmur.  Edema:    Trace lower extremity edema.  Abdominal:     Palpations: Abdomen is soft non tender. There is no hepatomegaly.  Skin:    General: Skin is warm and dry.  Neurological:     General: No focal deficit present.     Mental Status: Alert and oriented to person, place and time.     Cranial Nerves: Cranial nerves are intact.  EKG/LABS/ Recent Cardiac Studies    ECG personally reviewed by me today -none completed today  Lab Results  Component Value Date   WBC 5.9 03/22/2022   HGB 12.8 (L) 03/22/2022   HCT 36.8 (L) 03/22/2022   MCV 84.2 03/22/2022   PLT 256 03/22/2022   Lab Results  Component Value Date   CREATININE 0.76 03/22/2022   BUN 6 (L) 03/22/2022   NA 141 03/22/2022   K 4.0 03/22/2022   CL 104 03/22/2022   CO2 26 03/22/2022   Lab Results  Component Value Date   ALT 31 03/22/2022   AST 27 03/22/2022    ALKPHOS 100 01/01/2022   BILITOT 0.5 03/22/2022   Lab Results  Component Value Date   CHOL 90 03/22/2022   HDL 48 03/22/2022   LDLCALC 17 03/22/2022   TRIG 178 (H) 03/22/2022   CHOLHDL 1.9 03/22/2022    Lab Results  Component Value Date   HGBA1C 5.9 (H) 08/25/2021    Cardiac Studies & Procedures     STRESS TESTS  MYOCARDIAL PERFUSION IMAGING 04/20/2022  Narrative   The study is normal. The study is low risk.   No ST deviation was noted.   LV perfusion is normal. There is no evidence of ischemia. There is no evidence of infarction.   Left ventricular function is normal. Nuclear stress EF: 47 %. The left ventricular  ejection fraction is mildly decreased (45-54%). End diastolic cavity size is normal. End systolic cavity size is normal.   Prior study available for comparison from 05/25/2021.   ECHOCARDIOGRAM  ECHOCARDIOGRAM COMPLETE 11/10/2021  Narrative ECHOCARDIOGRAM REPORT    Patient Name:   GILLIE ADDARIO Date of Exam: 11/10/2021 Medical Rec #:  [id number]         Height:       70.0 in Accession #:    [id number]        Weight:       280.6 lb Date of Birth:  09-21-1960         BSA:          2.410 m Patient Age:    61 years          BP:           138/62 mmHg Patient Gender: M                 HR:           82 bpm. Exam Location:  [address]  Procedure: 2D Echo, Cardiac Doppler, Color Doppler and Strain Analysis  Indications:    R06.00 Dyspnea  History:        Patient has prior history of Echocardiogram examinations, most recent 10/08/2019. CAD and Previous Myocardial Infarction, Prior CABG; Risk Factors:Hypertension, Dyslipidemia and Diabetes. DVT.  Sonographer:    Daphine Deutscher RDCS Referring Phys: [id number] Zachary Gustavo SWINYER  IMPRESSIONS   1. Left ventricular ejection fraction, by estimation, is 60 to 65%. The left ventricle has normal function. The left ventricle has no regional wall motion abnormalities. Left ventricular diastolic parameters were  normal. The average left ventricular global longitudinal strain is -24.1 %. The global longitudinal strain is normal. 2. Right ventricular systolic function is normal. The right ventricular size is normal. Tricuspid regurgitation signal is inadequate for assessing PA pressure. 3. The mitral valve is grossly normal. Trivial mitral valve regurgitation. No evidence of mitral stenosis. 4. The aortic valve is tricuspid. Aortic valve regurgitation is not visualized. No aortic stenosis is present. 5. The inferior vena cava is normal in size with greater than 50% respiratory variability, suggesting right atrial pressure of 3 mmHg.  Conclusion(s)/Recommendation(s): Normal biventricular function without evidence of hemodynamically significant valvular heart disease.  FINDINGS Left Ventricle: Left ventricular ejection fraction, by estimation, is 60 to 65%. The left ventricle has normal function. The left ventricle has no regional wall motion abnormalities. The average left ventricular global longitudinal strain is -24.1 %. The global longitudinal strain is normal. The left ventricular internal cavity size was normal in size. There is no left ventricular hypertrophy. Left ventricular diastolic parameters were normal.  Right Ventricle: The right ventricular size is normal. No increase in right ventricular wall thickness. Right ventricular systolic function is normal. Tricuspid regurgitation signal is inadequate for assessing PA pressure.  Left Atrium: Left atrial size was normal in size.  Right Atrium: Right atrial size was normal in size.  Pericardium: Trivial pericardial effusion is present.  Mitral Valve: The mitral valve is grossly normal. Trivial mitral valve regurgitation. No evidence of mitral valve stenosis.  Tricuspid Valve: The tricuspid valve is grossly normal. Tricuspid valve regurgitation is trivial. No evidence of tricuspid stenosis.  Aortic Valve: The aortic valve is tricuspid. Aortic  valve regurgitation is not visualized. No aortic stenosis is present.  Pulmonic Valve: The pulmonic valve was grossly normal. Pulmonic valve regurgitation is not visualized. No evidence of pulmonic stenosis.  Aorta: The  aortic root and ascending aorta are structurally normal, with no evidence of dilitation.  Venous: The right lower pulmonary vein is normal. The inferior vena cava is normal in size with greater than 50% respiratory variability, suggesting right atrial pressure of 3 mmHg.  IAS/Shunts: The atrial septum is grossly normal.   LEFT VENTRICLE PLAX 2D LVIDd:         4.70 cm   Diastology LVIDs:         2.90 cm   LV e' medial:    9.75 cm/s LV PW:         1.10 cm   LV E/e' medial:  10.9 LV IVS:        1.10 cm   LV e' lateral:   11.43 cm/s LVOT diam:     2.40 cm   LV E/e' lateral: 9.3 LV SV:         96 LV SV Index:   40        2D Longitudinal Strain LVOT Area:     4.52 cm  2D Strain GLS Avg:     -24.1 %   RIGHT VENTRICLE             IVC RV Basal diam:  3.90 cm     IVC diam: 1.60 cm RV S prime:     13.80 cm/s TAPSE (M-mode): 1.8 cm  LEFT ATRIUM             Index        RIGHT ATRIUM           Index LA diam:        4.70 cm 1.95 cm/m   RA Area:     14.60 cm LA Vol (A2C):   58.2 ml 24.15 ml/m  RA Volume:   37.80 ml  15.68 ml/m LA Vol (A4C):   59.5 ml 24.68 ml/m LA Biplane Vol: 59.8 ml 24.81 ml/m AORTIC VALVE LVOT Vmax:   111.33 cm/s LVOT Vmean:  72.367 cm/s LVOT VTI:    0.212 m  AORTA Ao Root diam: 3.10 cm Ao Asc diam:  3.10 cm  MITRAL VALVE MV Area (PHT): 3.88 cm     SHUNTS MV Decel Time: 196 msec     Systemic VTI:  0.21 m MV E velocity: 106.00 cm/s  Systemic Diam: 2.40 cm MV A velocity: 100.50 cm/s MV E/A ratio:  1.05  Lennie Odor MD Electronically signed by Lennie Odor MD Signature Date/Time: 11/10/2021/3:59:08 PM    Final    MONITORS  LONG TERM MONITOR (3-14 DAYS) 05/29/2021  Narrative  Normal sinus rhythm  Frequent, high burden (25%),  symptomatic non-sustained SVT  Longest SVT run lasted 25 seconds at 111 bpm. Fastest SVT > 200 bpm.   Patch Wear Time:  6 days and 1 hours (2023-03-23T15:12:21-0400 to 2023-03-29T16:43:03-398)  Patient had a min HR of 53 bpm, max HR of 255 bpm, and avg HR of 81 bpm. Predominant underlying rhythm was Sinus Rhythm. Slight P wave morphology changes were noted. 1 run of Ventricular Tachycardia occurred lasting 4 beats with a max rate of 138 bpm (avg 111 bpm). [id number] Supraventricular Tachycardia runs occurred, the run with the fastest interval lasting 6 beats with a max rate of 255 bpm, the longest lasting 25.5 secs with an avg rate of 114 bpm. Supraventricular Tachycardia was detected within +/- 45 seconds of symptomatic patient event(s). Isolated SVEs were frequent (9.6%, [id number]), SVE Couplets were frequent (5.1%, 17485), and SVE Triplets were frequent (9.9%, [id number]). Isolated VEs  were rare (<1.0%, 103), VE Couplets were rare (<1.0%, 40), and VE Triplets were rare (<1.0%, 3).   CT SCANS  CT CORONARY MORPH W/CTA COR W/SCORE 10/18/2019  Addendum 10/18/2019  5:24 PM ADDENDUM REPORT: 10/18/2019 17:21  CLINICAL DATA:  45M with hypertension, hyperlipidemia, diabetes, PAD, prior CABG with atretic LIMA, and prior cocaine abuse with chest pain.  EXAM: Cardiac/Coronary  CT  TECHNIQUE: The patient was scanned on a [employer].  FINDINGS: A 120 kV prospective scan was triggered in the descending thoracic aorta at 111 HU's. Axial non-contrast 3 mm slices were carried out through the heart. The data set was analyzed on a dedicated work station and scored using the Agatson method. Gantry rotation speed was 250 msecs and collimation was .6 mm. No beta blockade and 0.8 mg of sl NTG was given. The 3D data set was reconstructed in 5% intervals of the 67-82 % of the R-R cycle. Diastolic phases were analyzed on a dedicated work station using MPR, MIP and VRT modes. The patient received 80 cc of  contrast.  Aorta: Normal size. Ascending aorta 2.7 cm. No calcifications. No dissection.  Aortic Valve:  Trileaflet.  No calcifications.  Coronary Arteries:  Normal coronary origin.  Right dominance.  RCA is a large dominant artery that gives rise to PDA and PLVB. There is no plaque.  Left main is a large artery that gives rise to LAD and LCX arteries.  LAD is a large vessel that has no plaque. Surgical clips noted near the mid LAD. LIMA is atretic. There is a large D1 without plaque.  LCX is a non-dominant artery that gives rise to a small OM1, large, branching OM2, and a small OM3. There is no plaque.  Other findings:  Normal pulmonary vein drainage into the left atrium.  Normal let atrial appendage without a thrombus.  Normal size of the pulmonary artery.  IMPRESSION: 1. Coronary calcium score of 0. This was 0 percentile for age and sex matched control.  2. Normal coronary origin with right dominance.  3. No evidence of CAD.  Chilton Si, MD   Electronically Signed By: Chilton Si On: 10/18/2019 17:21  Narrative EXAM: OVER-READ INTERPRETATION  CT CHEST  The following report is an over-read performed by radiologist Dr. Trudie Reed of [facility] on 10/18/2019. This over-read does not include interpretation of cardiac or coronary anatomy or pathology. The coronary calcium score/coronary CTA interpretation by the cardiologist is attached.  COMPARISON:  Chest CT 07/26/2013.  FINDINGS: Trace left pleural effusion lying dependently. Within the visualized portions of the thorax there are no suspicious appearing pulmonary nodules or masses, there is no acute consolidative airspace disease, no right pleural effusion, no pneumothorax and no lymphadenopathy. Visualized portions of the upper abdomen are unremarkable. There are no aggressive appearing lytic or blastic lesions noted in the visualized portions of the skeleton. Median  sternotomy wires.  IMPRESSION: 1. Trace left pleural effusion lying dependently.  Electronically Signed: By: Trudie Reed M.D. On: 10/18/2019 09:10          Assessment & Plan    1.Coronary artery disease: -History of CABG x 1 in 2002 with Lexiscan Myoview completed 05/2021 with no evidence of ischemia -Today patient reports ongoing chest pain with shortness of breath and palpitations. -Previous Lexiscan Myoview completed showing no evidence of decreased perfusion but did note reduced LV function. -We will schedule an echocardiogram for further evaluation of decreased LV function. -He will increase his Imdur to 90 mg  daily -Continue current GDMT with Plavix 75 mg daily, Lipitor 80 mg daily, ezetimibe 10 mg daily, Repatha 140 mg q. 14 days and Lopressor 50 mg daily   3.  Essential hypertension: -Patient's blood pressure today was elevated at 148/82 and was 142/84 on recheck. -He recently had a surgical procedure and is having some pain from his right wrist. -He will continue to monitor his blood pressure    4.  HFpEF: -Most recent 2D echo completed 10/2021 with EF of 60-65%, no RWMA, with trivial TVR and MVR  -Most recent Lexiscan Myoview showed decreased LV function and patient will complete updated 2D echo for further valuation. -Today patient reports occasional episodes of shortness of breath and trace amount of lower extremity edema today. -Patient will continue Lasix 40 mg with additional as needed dose of 40 mg as needed for weight gain of 2 pounds 24 hours and 5 pounds in 1 week. -Low sodium diet, fluid restriction <2L, and daily weights encouraged. Educated to contact our office for weight gain of 2 lbs overnight or 5 lbs in one week.    5.  Obstructive sleep apnea: -Patient reports compliance with his CPAP machine.  Disposition: Follow-up with None or APP in 1 months    Medication Adjustments/Labs and Tests Ordered: Current medicines are reviewed at length with the  patient today.  Concerns regarding medicines are outlined above.   Signed, Napoleon Form, Leodis Rains, NP 05/10/2022, 10:30 AM [facility]  Note:  This document was prepared using Dragon voice recognition software and may include unintentional dictation errors.

## 2022-05-11 ENCOUNTER — Ambulatory Visit: Payer: 59 | Attending: Nurse Practitioner | Admitting: Nurse Practitioner

## 2022-05-11 ENCOUNTER — Encounter: Payer: Self-pay | Admitting: Nurse Practitioner

## 2022-05-11 VITALS — BP 148/82 | HR 72 | Ht 70.0 in | Wt 278.8 lb

## 2022-05-11 DIAGNOSIS — G4733 Obstructive sleep apnea (adult) (pediatric): Secondary | ICD-10-CM | POA: Diagnosis not present

## 2022-05-11 DIAGNOSIS — I10 Essential (primary) hypertension: Secondary | ICD-10-CM | POA: Diagnosis not present

## 2022-05-11 DIAGNOSIS — E1159 Type 2 diabetes mellitus with other circulatory complications: Secondary | ICD-10-CM

## 2022-05-11 DIAGNOSIS — I2510 Atherosclerotic heart disease of native coronary artery without angina pectoris: Secondary | ICD-10-CM

## 2022-05-11 DIAGNOSIS — I5032 Chronic diastolic (congestive) heart failure: Secondary | ICD-10-CM | POA: Diagnosis not present

## 2022-05-11 MED ORDER — ISOSORBIDE MONONITRATE ER 60 MG PO TB24: 90.0000 mg | ORAL_TABLET | Freq: Every day | ORAL | 2 refills | Status: DC

## 2022-05-11 NOTE — Patient Instructions (Addendum)
Medication Instructions:  INCREASE Imdur to 90mg  (tablet and a half) once a day  *If you need a refill on your cardiac medications before your next appointment, please call your pharmacy*   Lab Work: None ordered  If you have labs (blood work) drawn today and your tests are completely normal, you will receive your results only by: [place] (if you have MyChart) OR A paper copy in the mail If you have any lab test that is abnormal or we need to change your treatment, we will call you to review the results.   Testing/Procedures: Your physician has requested that you have an echocardiogram. Echocardiography is a painless test that uses sound waves to create images of your heart. It provides your doctor with information about the size and shape of your heart and how well your heart's chambers and valves are working. This procedure takes approximately one hour. There are no restrictions for this procedure. Please do NOT wear cologne, perfume, aftershave, or lotions (deodorant is allowed). Please arrive 15 minutes prior to your appointment time.    Follow-Up: At [facility], you and your health needs are our priority.  As part of our continuing mission to provide you with exceptional heart care, we have created designated Provider Care Teams.  These Care Teams include your primary Cardiologist (physician) and Advanced Practice Providers (APPs -  Physician Assistants and Nurse Practitioners) who all work together to provide you with the care you need, when you need it.  We recommend signing up for the patient portal called "MyChart".  Sign up information is provided on this After Visit Summary.  MyChart is used to connect with patients for Virtual Visits (Telemedicine).  Patients are able to view lab/test results, encounter notes, upcoming appointments, etc.  Non-urgent messages can be sent to your provider as well.   To learn more about what you can do with MyChart, go to  [email or web address].    Your next appointment:   1 month(s)  Provider:   Ambrose Pancoast, NP       Other Instructions

## 2022-05-14 ENCOUNTER — Other Ambulatory Visit (HOSPITAL_COMMUNITY): Payer: 59

## 2022-05-18 ENCOUNTER — Other Ambulatory Visit: Payer: Self-pay

## 2022-05-18 ENCOUNTER — Observation Stay (HOSPITAL_COMMUNITY)
Admission: EM | Admit: 2022-05-18 | Discharge: 2022-05-20 | Disposition: A | Discharge status: Home / Self Care | Payer: 59 | Attending: Family Medicine | Admitting: Family Medicine

## 2022-05-18 ENCOUNTER — Emergency Department (HOSPITAL_COMMUNITY): Payer: 59

## 2022-05-18 ENCOUNTER — Encounter (HOSPITAL_COMMUNITY): Payer: Self-pay | Admitting: *Deleted

## 2022-05-18 ENCOUNTER — Ambulatory Visit (INDEPENDENT_AMBULATORY_CARE_PROVIDER_SITE_OTHER): Payer: 59 | Admitting: Podiatry

## 2022-05-18 DIAGNOSIS — E785 Hyperlipidemia, unspecified: Secondary | ICD-10-CM | POA: Diagnosis present

## 2022-05-18 DIAGNOSIS — J449 Chronic obstructive pulmonary disease, unspecified: Secondary | ICD-10-CM | POA: Diagnosis not present

## 2022-05-18 DIAGNOSIS — K219 Gastro-esophageal reflux disease without esophagitis: Secondary | ICD-10-CM | POA: Diagnosis present

## 2022-05-18 DIAGNOSIS — R0789 Other chest pain: Secondary | ICD-10-CM | POA: Diagnosis not present

## 2022-05-18 DIAGNOSIS — R55 Syncope and collapse: Secondary | ICD-10-CM

## 2022-05-18 DIAGNOSIS — I10 Essential (primary) hypertension: Secondary | ICD-10-CM | POA: Diagnosis present

## 2022-05-18 DIAGNOSIS — E119 Type 2 diabetes mellitus without complications: Secondary | ICD-10-CM | POA: Diagnosis not present

## 2022-05-18 DIAGNOSIS — I110 Hypertensive heart disease with heart failure: Secondary | ICD-10-CM | POA: Diagnosis not present

## 2022-05-18 DIAGNOSIS — R079 Chest pain, unspecified: Secondary | ICD-10-CM | POA: Diagnosis not present

## 2022-05-18 DIAGNOSIS — E1140 Type 2 diabetes mellitus with diabetic neuropathy, unspecified: Secondary | ICD-10-CM | POA: Insufficient documentation

## 2022-05-18 DIAGNOSIS — F331 Major depressive disorder, recurrent, moderate: Secondary | ICD-10-CM | POA: Diagnosis present

## 2022-05-18 DIAGNOSIS — Z86718 Personal history of other venous thrombosis and embolism: Secondary | ICD-10-CM | POA: Diagnosis not present

## 2022-05-18 DIAGNOSIS — I25110 Atherosclerotic heart disease of native coronary artery with unstable angina pectoris: Secondary | ICD-10-CM | POA: Diagnosis not present

## 2022-05-18 DIAGNOSIS — Z951 Presence of aortocoronary bypass graft: Secondary | ICD-10-CM | POA: Diagnosis not present

## 2022-05-18 DIAGNOSIS — I200 Unstable angina: Secondary | ICD-10-CM

## 2022-05-18 DIAGNOSIS — Z7984 Long term (current) use of oral hypoglycemic drugs: Secondary | ICD-10-CM | POA: Insufficient documentation

## 2022-05-18 DIAGNOSIS — M79675 Pain in left toe(s): Secondary | ICD-10-CM

## 2022-05-18 DIAGNOSIS — I5030 Unspecified diastolic (congestive) heart failure: Secondary | ICD-10-CM | POA: Diagnosis not present

## 2022-05-18 DIAGNOSIS — Z7902 Long term (current) use of antithrombotics/antiplatelets: Secondary | ICD-10-CM | POA: Diagnosis not present

## 2022-05-18 DIAGNOSIS — E1159 Type 2 diabetes mellitus with other circulatory complications: Secondary | ICD-10-CM | POA: Diagnosis not present

## 2022-05-18 DIAGNOSIS — B351 Tinea unguium: Secondary | ICD-10-CM | POA: Diagnosis not present

## 2022-05-18 DIAGNOSIS — D649 Anemia, unspecified: Secondary | ICD-10-CM | POA: Diagnosis not present

## 2022-05-18 DIAGNOSIS — J45909 Unspecified asthma, uncomplicated: Secondary | ICD-10-CM | POA: Insufficient documentation

## 2022-05-18 DIAGNOSIS — Z79899 Other long term (current) drug therapy: Secondary | ICD-10-CM | POA: Diagnosis not present

## 2022-05-18 DIAGNOSIS — E1142 Type 2 diabetes mellitus with diabetic polyneuropathy: Secondary | ICD-10-CM | POA: Diagnosis not present

## 2022-05-18 DIAGNOSIS — M79674 Pain in right toe(s): Secondary | ICD-10-CM | POA: Diagnosis not present

## 2022-05-18 HISTORY — DX: Personal history of other specified conditions: Z87.898

## 2022-05-18 HISTORY — DX: Unspecified diastolic (congestive) heart failure: I50.30

## 2022-05-18 HISTORY — DX: Hyperlipidemia, unspecified: E78.5

## 2022-05-18 LAB — URINALYSIS, ROUTINE W REFLEX MICROSCOPIC
Bilirubin Urine: NEGATIVE
Glucose, UA: NEGATIVE mg/dL
Hgb urine dipstick: NEGATIVE
Ketones, ur: 20 mg/dL — AB
Nitrite: NEGATIVE
Protein, ur: NEGATIVE mg/dL
Specific Gravity, Urine: 1.011 (ref 1.005–1.030)
WBC, UA: >50 WBC/hpf (ref 0–5)
pH: 7 (ref 5.0–8.0)

## 2022-05-18 LAB — BASIC METABOLIC PANEL
Anion gap: 10 (ref 5–15)
BUN: 15 mg/dL (ref 6–20)
CO2: 26 mmol/L (ref 22–32)
Calcium: 9.3 mg/dL (ref 8.9–10.3)
Chloride: 100 mmol/L (ref 98–111)
Creatinine, Ser: 1.03 mg/dL (ref 0.61–1.24)
GFR, Estimated: >60 mL/min (ref >60)
Glucose, Bld: 104 mg/dL — ABNORMAL HIGH (ref 70–99)
Potassium: 3.7 mmol/L (ref 3.5–5.1)
Sodium: 136 mmol/L (ref 135–145)

## 2022-05-18 LAB — CBC
HCT: 35.8 % — ABNORMAL LOW (ref 39.0–52.0)
Hemoglobin: 11.9 g/dL — ABNORMAL LOW (ref 13.0–17.0)
MCH: 28.8 pg (ref 26.0–34.0)
MCHC: 33.2 g/dL (ref 30.0–36.0)
MCV: 86.7 fL (ref 80.0–100.0)
Platelets: 262 10*3/uL (ref 150–400)
RBC: 4.13 MIL/uL — ABNORMAL LOW (ref 4.22–5.81)
RDW: 13.8 % (ref 11.5–15.5)
WBC: 4.8 10*3/uL (ref 4.0–10.5)
nRBC: 0 % (ref 0.0–0.2)

## 2022-05-18 LAB — TROPONIN I (HIGH SENSITIVITY)
Troponin I (High Sensitivity): 2 ng/L (ref <18)
Troponin I (High Sensitivity): <2 ng/L (ref <18)

## 2022-05-18 LAB — D-DIMER, QUANTITATIVE: D-Dimer, Quant: 0.47 ug/mL-FEU (ref 0.00–0.50)

## 2022-05-18 LAB — GLUCOSE, CAPILLARY: Glucose-Capillary: 97 mg/dL (ref 70–99)

## 2022-05-18 MED ORDER — LISINOPRIL-HYDROCHLOROTHIAZIDE 10-12.5 MG PO TABS: 1.0000 | ORAL_TABLET | Freq: Every day | ORAL | Status: DC

## 2022-05-18 MED ORDER — ACETAMINOPHEN 650 MG RE SUPP: 650.0000 mg | Freq: Four times a day (QID) | RECTAL | Status: DC | PRN

## 2022-05-18 MED ORDER — ALPRAZOLAM 1 MG PO TABS
1.0000 mg | ORAL_TABLET | Freq: Three times a day (TID) | ORAL | Status: DC
  Administered 2022-05-18 – 2022-05-20 (×5): 1 mg via ORAL
  Filled 2022-05-18 (×5): qty 1

## 2022-05-18 MED ORDER — INSULIN ASPART 100 UNIT/ML IJ SOLN: 0.0000 [IU] | Freq: Every day | INTRAMUSCULAR | Status: DC

## 2022-05-18 MED ORDER — HEPARIN SODIUM (PORCINE) 5000 UNIT/ML IJ SOLN
5000.0000 [IU] | Freq: Three times a day (TID) | INTRAMUSCULAR | Status: DC
  Administered 2022-05-18 – 2022-05-20 (×5): 5000 [IU] via SUBCUTANEOUS
  Filled 2022-05-18 (×5): qty 1

## 2022-05-18 MED ORDER — HYDROCHLOROTHIAZIDE 12.5 MG PO TABS: 12.5000 mg | ORAL_TABLET | Freq: Every day | ORAL | Status: DC

## 2022-05-18 MED ORDER — TAMSULOSIN HCL 0.4 MG PO CAPS
0.4000 mg | ORAL_CAPSULE | Freq: Every day | ORAL | Status: DC
  Administered 2022-05-19 – 2022-05-20 (×2): 0.4 mg via ORAL
  Filled 2022-05-18 (×2): qty 1

## 2022-05-18 MED ORDER — ATORVASTATIN CALCIUM 40 MG PO TABS
80.0000 mg | ORAL_TABLET | Freq: Every day | ORAL | Status: DC
  Administered 2022-05-18 – 2022-05-20 (×3): 80 mg via ORAL
  Filled 2022-05-18 (×3): qty 2

## 2022-05-18 MED ORDER — METOPROLOL TARTRATE 50 MG PO TABS
50.0000 mg | ORAL_TABLET | Freq: Two times a day (BID) | ORAL | Status: DC
  Administered 2022-05-18 – 2022-05-20 (×4): 50 mg via ORAL
  Filled 2022-05-18 (×4): qty 1

## 2022-05-18 MED ORDER — ACETAMINOPHEN 325 MG PO TABS: 650.0000 mg | ORAL_TABLET | Freq: Four times a day (QID) | ORAL | Status: DC | PRN

## 2022-05-18 MED ORDER — ONDANSETRON HCL 4 MG PO TABS: 4.0000 mg | ORAL_TABLET | Freq: Four times a day (QID) | ORAL | Status: DC | PRN

## 2022-05-18 MED ORDER — FAMOTIDINE 20 MG PO TABS
20.0000 mg | ORAL_TABLET | Freq: Every day | ORAL | Status: DC
  Administered 2022-05-18 – 2022-05-19 (×2): 20 mg via ORAL
  Filled 2022-05-18 (×2): qty 1

## 2022-05-18 MED ORDER — OXYCODONE HCL 5 MG PO TABS
5.0000 mg | ORAL_TABLET | ORAL | Status: DC | PRN
  Administered 2022-05-19 – 2022-05-20 (×4): 5 mg via ORAL
  Filled 2022-05-18 (×4): qty 1

## 2022-05-18 MED ORDER — PANTOPRAZOLE SODIUM 40 MG PO TBEC
40.0000 mg | DELAYED_RELEASE_TABLET | Freq: Every day | ORAL | Status: DC
  Administered 2022-05-18 – 2022-05-20 (×3): 40 mg via ORAL
  Filled 2022-05-18 (×3): qty 1

## 2022-05-18 MED ORDER — ONDANSETRON HCL 4 MG/2ML IJ SOLN: 4.0000 mg | Freq: Four times a day (QID) | INTRAMUSCULAR | Status: DC | PRN

## 2022-05-18 MED ORDER — LISINOPRIL 10 MG PO TABS
10.0000 mg | ORAL_TABLET | Freq: Every day | ORAL | Status: DC
  Administered 2022-05-19 – 2022-05-20 (×2): 10 mg via ORAL
  Filled 2022-05-18 (×2): qty 1

## 2022-05-18 MED ORDER — ISOSORBIDE MONONITRATE ER 60 MG PO TB24
90.0000 mg | ORAL_TABLET | Freq: Every day | ORAL | Status: DC
  Administered 2022-05-19: 90 mg via ORAL
  Filled 2022-05-18: qty 2

## 2022-05-18 MED ORDER — IPRATROPIUM BROMIDE 0.02 % IN SOLN: 0.5000 mg | Freq: Two times a day (BID) | RESPIRATORY_TRACT | Status: DC | PRN

## 2022-05-18 MED ORDER — EZETIMIBE 10 MG PO TABS
10.0000 mg | ORAL_TABLET | Freq: Every day | ORAL | Status: DC
  Administered 2022-05-18 – 2022-05-20 (×3): 10 mg via ORAL
  Filled 2022-05-18 (×3): qty 1

## 2022-05-18 MED ORDER — CLOPIDOGREL BISULFATE 75 MG PO TABS
75.0000 mg | ORAL_TABLET | Freq: Every day | ORAL | Status: DC
  Administered 2022-05-19 – 2022-05-20 (×2): 75 mg via ORAL
  Filled 2022-05-18 (×2): qty 1

## 2022-05-18 MED ORDER — INSULIN ASPART 100 UNIT/ML IJ SOLN
0.0000 [IU] | Freq: Three times a day (TID) | INTRAMUSCULAR | Status: DC
  Administered 2022-05-20: 2 [IU] via SUBCUTANEOUS

## 2022-05-18 MED ORDER — SERTRALINE HCL 50 MG PO TABS
100.0000 mg | ORAL_TABLET | Freq: Every day | ORAL | Status: DC
  Administered 2022-05-19 – 2022-05-20 (×2): 100 mg via ORAL
  Filled 2022-05-18 (×2): qty 2

## 2022-05-18 NOTE — ED Triage Notes (Signed)
Pt with palpitations for past 2 days, seen at [facility] in [place] and was instructed to go to ED but pt did not til today. + SOB and mid CP.

## 2022-05-18 NOTE — Assessment & Plan Note (Addendum)
-   Continue lisinopril, metoprolol, cardiology team discontinued Imdur, HCTZ

## 2022-05-18 NOTE — Assessment & Plan Note (Signed)
Continue Zoloft 

## 2022-05-18 NOTE — Assessment & Plan Note (Signed)
-   atorvastatin, Zetia, resume Repatha at discharge

## 2022-05-18 NOTE — Assessment & Plan Note (Signed)
-   CBGs with sliding scale coverage CBG (last 3)  Recent Labs    05/19/22 1610 05/19/22 1949 05/20/22 0815  GLUCAP 102* 134* 121*

## 2022-05-18 NOTE — ED Provider Notes (Signed)
[facility] Provider Note   CSN: [id number] Arrival date & time: 05/18/22  1407     History {Add pertinent medical, surgical, social history, OB history to HPI:1} Chief Complaint  Patient presents with   Palpitations    Dalton [name] is a 62 y.o. male.  Patient has a history of coronary artery disease and had bypass surgery done in 2002.  Patient saw cardiology recently and they have arranged to get an echo and Myoview done.  Since that time the patient has had 2 syncopal episodes and increasing chest discomfort  The history is provided by the patient and medical records.  Chest Pain      Home Medications Prior to Admission medications   Medication Sig Start Date End Date Taking? Authorizing Provider  albuterol (PROVENTIL HFA;VENTOLIN HFA) 108 (90 Base) MCG/ACT inhaler Inhale 2 puffs into the lungs every 6 (six) hours as needed for wheezing or shortness of breath.    Yes [provider]  NARCAN 4 MG/0.1ML LIQD nasal spray kit Place 1 spray into the nose as needed (opioid overdose).  06/15/17  Yes [provider]  nitroGLYCERIN (NITROSTAT) 0.4 MG SL tablet Place 1 tablet (0.4 mg total) under the tongue every 5 (five) minutes as needed for chest pain. 08/26/21  Yes Johnson, Clanford L, MD  albuterol (PROVENTIL) (2.5 MG/3ML) 0.083% nebulizer solution Take 2.5 mg by nebulization every 6 (six) hours as needed for wheezing or shortness of breath.     [provider]  ALPRAZolam Duanne Moron) 1 MG tablet Take 1 mg by mouth 3 (three) times daily.  08/01/19   [provider]  amLODipine (NORVASC) 5 MG tablet TAKE 1 TABLET(5 MG) BY MOUTH DAILY 04/07/20   Arnoldo Lenis, MD  atorvastatin (LIPITOR) 80 MG tablet Take 80 mg by mouth at bedtime. 12/05/20   [provider]  azelastine (ASTELIN) 0.1 % nasal spray Place 1 spray into both nostrils 2 (two) times daily. 12/29/21   [provider]   beclomethasone (QVAR) 80 MCG/ACT inhaler Inhale 2 puffs into the lungs 2 (two) times daily as needed (respiratory issues.).     [provider]  BELSOMRA 15 MG TABS Take 1 tablet by mouth at bedtime. 04/30/21   [provider]  benzonatate (TESSALON) 100 MG capsule Take 1 capsule (100 mg total) by mouth 3 (three) times daily as needed for cough. 01/02/22   Quintella Reichert, MD  brimonidine (ALPHAGAN P) 0.1 % SOLN Place 1 drop into the right eye 2 (two) times daily. 12/25/20   [provider]  cetirizine (ZYRTEC) 10 MG tablet Take 10 mg by mouth daily as needed for allergies.  01/26/16   [provider]  clopidogrel (PLAVIX) 75 MG tablet Take 75 mg by mouth daily.    [provider]  dexlansoprazole (DEXILANT) 60 MG capsule Take 1 capsule (60 mg total) by mouth daily. 07/05/17   Fields, Marga Melnick, MD  Evolocumab (REPATHA SURECLICK) [id number] MG/ML SOAJ Take 140 mg by mouth every 14 (fourteen) days. 03/10/22   Arnoldo Lenis, MD  ezetimibe (ZETIA) 10 MG tablet Take 10 mg by mouth daily.    [provider]  famotidine (PEPCID) 20 MG tablet Take 20 mg by mouth at bedtime. 12/29/21   [provider]  fluticasone (FLONASE) 50 MCG/ACT nasal spray Place 2 sprays into both nostrils daily.    [provider]  Fluticasone Furoate (ARNUITY ELLIPTA) 200 MCG/ACT AEPB Inhale 1 puff into  the lungs daily as needed (shortness of breath).     [provider]  furosemide (LASIX) 40 MG tablet Take 1 tablet (40 mg total) by mouth daily. Can take an additional 40mg  tab as needed for swelling or weight gain 04/13/22   Marylu Lund., NP  glipiZIDE (GLUCOTROL) 10 MG tablet Take 10 mg by mouth daily before breakfast.    [provider]  ipratropium (ATROVENT) 0.02 % nebulizer solution Take 0.5 mg by nebulization 2 (two) times daily as needed for wheezing or shortness of breath (for congestion).     [provider]  isosorbide  mononitrate (IMDUR) 60 MG 24 hr tablet Take 1.5 tablets (90 mg total) by mouth daily. 05/11/22   Marylu Lund., NP  ketoconazole (NIZORAL) 2 % shampoo Apply 1 application topically daily as needed for irritation.  10/23/19   [provider]  latanoprost (XALATAN) 0.005 % ophthalmic solution Place 1 drop into both eyes at bedtime.    [provider]  LINZESS 290 MCG CAPS capsule TAKE 1 CAPSULE(290 MCG) BY MOUTH DAILY BEFORE BREAKFAST Patient taking differently: Take 290 mcg by mouth daily before breakfast. May take 1 additional capsule as needed for constipation 02/04/21   Mahala Menghini, PA-C  lisinopril-hydrochlorothiazide (ZESTORETIC) 10-12.5 MG tablet Take 1 tablet by mouth daily. 03/11/21   [provider]  magnesium citrate SOLN Take 1 Bottle by mouth as needed for moderate constipation.     [provider]  metFORMIN (GLUCOPHAGE-XR) 500 MG 24 hr tablet Take 500 mg by mouth daily with breakfast.     [provider]  methocarbamol (ROBAXIN) 500 MG tablet Take 1 tablet (500 mg total) by mouth every 6 (six) hours as needed. 11/24/21   Sherrell Puller, PA-C  metoprolol tartrate (LOPRESSOR) 50 MG tablet TAKE 1 TABLET(50 MG) BY MOUTH TWICE DAILY 02/04/22   Belva Crome, MD  MOUNJARO 2.5 MG/0.5ML Pen SMARTSIG:2.5 Milligram(s) SUB-Q Once a Week    [provider]  Multiple Vitamin (MULTIVITAMIN WITH MINERALS) TABS tablet Take 1 tablet by mouth daily.    [provider]  naloxegol oxalate (MOVANTIK) 25 MG TABS tablet Take 1 tablet (25 mg total) by mouth daily. 08/21/19   Carlis Stable, NP  oxyCODONE-acetaminophen (PERCOCET) 10-325 MG tablet Take 1 tablet by mouth every 4 (four) hours as needed for pain.    [provider]  potassium chloride SA (KLOR-CON) 20 MEQ tablet Take 20 mEq by mouth 2 (two) times daily.    [provider]  predniSONE (DELTASONE) 10 MG tablet Take 40 mg by mouth daily. 04/24/21   [provider]   sertraline (ZOLOFT) 100 MG tablet Take 100 mg by mouth daily.    [provider]  sildenafil (VIAGRA) 100 MG tablet Take by mouth as needed for erectile dysfunction. 03/11/22   [provider]  sitaGLIPtin (JANUVIA) 100 MG tablet Take 100 mg by mouth daily.    [provider]  sucralfate (CARAFATE) 1 GM/10ML suspension in the morning, at noon, in the evening, and at bedtime. 09/10/20   [provider]  tamsulosin (FLOMAX) 0.4 MG CAPS capsule Take 1 capsule (0.4 mg total) by mouth daily after breakfast. 01/07/16   Kerrie Buffalo, NP  TRULANCE 3 MG TABS Take 1 tablet by mouth daily.    [provider]  Vitamin D, Ergocalciferol, (DRISDOL) 1.25 MG (50000 UT) CAPS capsule Take 50,000 Units by mouth every 7 (seven) days. Monday    [provider]      Allergies    Gabapentin, Ibuprofen, Zolpidem tartrate, and Naproxen    Review of Systems   Review of Systems  Cardiovascular:  Positive for chest pain.    Physical Exam Updated Vital Signs BP 123/62   Pulse 76   Temp (!) 97.5 F (36.4 C) (Oral)   Resp 16   Ht 5\' 10"  (1.778 m)   Wt 125.2 kg   BMI 39.60 kg/m  Physical Exam  ED Results / Procedures / Treatments   Labs (all labs ordered are listed, but only abnormal results are displayed) Labs Reviewed  BASIC METABOLIC PANEL - Abnormal; Notable for the following components:      Result Value   Glucose, Bld 104 (*)    All other components within normal limits  CBC - Abnormal; Notable for the following components:   RBC 4.13 (*)    Hemoglobin 11.9 (*)    HCT 35.8 (*)    All other components within normal limits  D-DIMER, QUANTITATIVE  TROPONIN I (HIGH SENSITIVITY)  TROPONIN I (HIGH SENSITIVITY)    EKG EKG Interpretation  Date/Time:  Tuesday May 18 2022 14:22:38 EDT Ventricular Rate:  105 PR Interval:  192 QRS Duration: 82 QT Interval:  344 QTC Calculation: 454 R Axis:   46 Text Interpretation: Sinus tachycardia T  wave abnormality, consider inferior ischemia Abnormal ECG When compared with ECG of 19-Apr-2022 08:14, Premature atrial complexes are no longer Present Confirmed by Milton Ferguson [phone]) on 05/18/2022 3:35:57 PM  Radiology CT Head Wo Contrast  Result Date: 05/18/2022 CLINICAL DATA:  Neuro deficit, acute, stroke suspected EXAM: CT HEAD WITHOUT CONTRAST TECHNIQUE: Contiguous axial images were obtained from the base of the skull through the vertex without intravenous contrast. RADIATION DOSE REDUCTION: This exam was performed according to the departmental dose-optimization program which includes automated exposure control, adjustment of the mA and/or kV according to patient size and/or use of iterative reconstruction technique. COMPARISON:  CT head December 10, 2015. FINDINGS: Brain: No evidence of acute infarction, hemorrhage, hydrocephalus, extra-axial collection or mass lesion/mass effect. Vascular: No hyperdense vessel identified. Skull: No acute fracture. Sinuses/Orbits: Clear sinuses.  No acute orbital findings. Other: No mastoid effusions. IMPRESSION: No evidence of acute intracranial abnormality. Electronically Signed   By: Margaretha Sheffield M.D.   On: 05/18/2022 17:05   DG Chest 2 View  Result Date: 05/18/2022 CLINICAL DATA:  Chest pain and shortness of breath EXAM: CHEST - 2 VIEW COMPARISON:  Chest x-ray dated January 01, 2022 FINDINGS: The heart size and mediastinal contours are within normal limits. Prior median sternotomy. Vascular stent overlying the left upper lobe. Both lungs are clear. The visualized skeletal structures are unremarkable. IMPRESSION: No active cardiopulmonary disease. Electronically Signed   By: Yetta Glassman M.D.   On: 05/18/2022 14:44    Procedures Procedures  {Document cardiac monitor, telemetry assessment procedure when appropriate:1}  Medications Ordered in ED Medications - No data to display  ED Course/ Medical Decision Making/ A&P  Patient with increasing  shortness of breath and chest discomfort.  I spoke with cardiology Dr. Domenic Polite and he stated if the second troponin was normal the patient could be admitted to the hospitalist at [facility] {   Click here for ABCD2, HEART and other calculatorsREFRESH Note before signing :1}                          Medical Decision Making Amount and/or Complexity of Data Reviewed  Labs: ordered. Radiology: ordered.  Risk Decision regarding hospitalization.   Syncope and chest pain with exertion.  Patient will be admitted to medicine with cardiology consult  {Document critical care time when appropriate:1} {Document review of labs and clinical decision tools ie heart score, Chads2Vasc2 etc:1}  {Document your independent review of radiology images, and any outside records:1} {Document your discussion with family members, caretakers, and with consultants:1} {Document social determinants of health affecting pt's care:1} {Document your decision making why or why not admission, treatments were needed:1} Final Clinical Impression(s) / ED Diagnoses Final diagnoses:  Unstable angina pectoris ([place])  Syncope and collapse    Rx / DC Orders ED Discharge Orders     None

## 2022-05-18 NOTE — Progress Notes (Signed)
  Subjective:  Patient ID: Dalton [name], male    DOB: Aug 07, 1960,  MRN: [id number]  Chief Complaint  Patient presents with   Diabetes    Diabetic foot exam -    Peripheral Neuropathy    Both feet are painful    62 y.o. male returns for follow-up with the above complaint. History confirmed with patient.  He returns for follow-up.  The nails are thickened and elongated causing discomfort again.  Still having occasional burning aching pain.  He was doing okay on the gabapentin and has been on Lyrica as well but they had to take him off of this, he is dealing with other heart and kidney issues.  Objective:  Physical Exam: +2 pedal pulses, foot is warm and well-perfused.  He has abnormal sensory exam with loss of protective sensation with a monofilament, subjective paresthesias.  Thickened elongated yellow-brown discolored toenails with dystrophy Assessment:   1. Type 2 diabetes mellitus with vascular disease ([place])   2. Diabetic peripheral neuropathy (H[facility])   3. Pain due to onychomycosis of toenails of both feet   4. Encounter for diabetic foot exam [facility])      Plan:  Patient was evaluated and treated and all questions answered.  Patient educated on diabetes. Discussed proper diabetic foot care and discussed risks and complications of disease. Educated patient in depth on reasons to return to the office immediately should he/she discover anything concerning or new on the feet. All questions answered. Discussed proper shoes as well.  We discussed the impact of diabetic peripheral neuropathy.  Unfortunately medical treatment of this is not feasible for him right now due to interactions with medications, renal disease and medication intolerance.  Discussed focusing on lowering his A1c this may improve his neuropathic symptoms.  Discussed the etiology and treatment options for the condition in detail with the patient.  Prior to Reverdin of the nails have been helpful.  Recommended debridement  of the nails today. Sharp and mechanical debridement performed of all painful and mycotic nails today. Nails debrided in length and thickness using a nail nipper to level of comfort. Discussed treatment options including appropriate shoe gear. Follow up as needed for painful nails.       No follow-ups on file.

## 2022-05-18 NOTE — ED Provider Triage Note (Signed)
Emergency Medicine Provider Triage Evaluation Note  Dalton [name] , a 62 y.o. male  was evaluated in triage.  Hx of CABG in 2002.  Pt complains of chest pain for 2 days.  Chest pain has been associated with dyspnea on exertion and orthopnea.  Also complains of nonproductive cough.  No improvement with over-the-counter cough medications.  Describes pressure sensation to the mid chest.  No fever or chills.  Seen by his cardiologist on 05/11/2022.  Echocardiogram scheduled  Review of Systems  Positive: Cough, chest pain, shortness of breath with exertion and orthopnea Negative: Fever, chills, abdominal pain, flank pain worsening peripheral edema  Physical Exam  BP 123/62   Pulse 76   Temp (!) 97.5 F (36.4 C) (Oral)   Resp 16   Ht 5\' 10"  (1.778 m)   Wt 125.2 kg   BMI 39.60 kg/m  Gen:   Awake, no distress   Resp:  Normal effort  MSK:   Moves extremities without difficulty  Other:    Medical Decision Making  Medically screening exam initiated at 3:10 PM.  Appropriate orders placed.  Dalton [name] was informed that the remainder of the evaluation will be completed by another provider, this initial triage assessment does not replace that evaluation, and the importance of remaining in the ED until their evaluation is complete.     Kem Parkinson, PA-C 05/18/22 1518

## 2022-05-18 NOTE — Assessment & Plan Note (Signed)
-   Follow up TTE and get a PT evaluation . - Carotid ultrasound to eval for hemodynamically significant stenosis  - Orthostatics are not indicative of orthostatic hypotension etiology - Consult cardiology appreciated  - Monitor on telemetry

## 2022-05-18 NOTE — Assessment & Plan Note (Signed)
-   With associated dyspnea and syncope - Recently saw cardiology on March 19 where they discussed a Myoview showing no evidence of decreased perfusion but did show reduced LV function, they recommended follow-up with echocardiogram - Will get echo in the a.m. - Will continue Plavix, Lipitor, Zetia, Lopressor - Resume Repatha at discharge - EKG today showed a heart rate of 105, sinus arrhythmia, QTc 454 with some T wave inversions - Troponin undetectable - Cardiology was consulted from the ER and stated both tropes were negative patient can stay here with plans for inpatient cardiology consult in the a.m. - Appreciate recs from cardiology - D-dimer was 0.47 - Will monitor on telemetry

## 2022-05-18 NOTE — Assessment & Plan Note (Signed)
-  Continue PPI and Pepcid 

## 2022-05-18 NOTE — H&P (Signed)
History and Physical    Patient: Dalton [name] DOB: [birth year] DOA: 05/18/2022 DOS: the patient was seen and examined on 05/18/2022 PCP: Nolene Ebbs, MD  Patient coming from: Home  Chief Complaint:  Chief Complaint  Patient presents with   Palpitations   HPI: Dalton [name] is a 62 y.o. male with medical history significant of heart failure with preserved ejection fraction, anxiety, depression, COPD, history of DVT, GERD, hyperlipidemia, hypertension, CABG x 1 in 2000, type 2 diabetes, and more presents the ED with a chief complaint of syncopal episodes.  Patient reports that all of his cardiology team is in [place].  He saw them recently, and they were scheduling him for an echo.  He reports he was seeing them because his blood pressure has been going up and down, and that his heart has been slowing down per his report.  According to the cardiology note he was presenting for 1 month follow-up after complaining of ongoing chest pain.  During that appointment they discussed a Myoview that showed decrease in LV function, so they recommended an echo.  Patient presents today because he had several syncopal events, insomnia, and decreased appetite.  He reports he has still been compliant with all of his medications despite the symptoms.  Patient reports that he had a syncopal episode about 4 days ago, 2 syncopal episodes 2 days ago, and 1 today.  He reports that 1 today happen when he was at rest sitting down, started to have chest pain and shortness of breath, so he stood up to try to get his circulation going and was planning to walk around.  Instead he woke up on the floor.  He reports it took 10 to 15 minutes for him to regain his strength to stand back up.  Son at bedside reports that he was out for approximately 30 seconds.  Patient reports that sometimes he does feel dizzy before this happens, but he did not today.  He reports that he did have a chest pain and dyspnea.  He  describes the chest pain as being in the center of his chest and feeling like a heart attack.  He describes it as sharp pain.  He feels clogged so that he cannot breathe.  Other episodes that he had previously were falling out of bed or falling when he tried to get up, all with loss of consciousness per his report.  The only other abnormal thing for him lately is URI symptoms.  He has had a cough, subjective fever, and mucus per his report.  He is taking TheraFlu and it has helped.  He denies dysuria and diarrhea.  He does report foul-smelling urine.  He denies any hematuria, hematochezia, melena.  He complains of his decreased appetite, but he is also on Mounjaro.  Patient has no other complaints at this time.  Patient does not smoke, does not drink, and reports he does not use illicit drugs.  He has been vaccinated for COVID and flu.  Patient is full code. Review of Systems: As mentioned in the history of present illness. All other systems reviewed and are negative. Past Medical History:  Diagnosis Date   (HFpEF) heart failure with preserved ejection fraction (HCC)    Anemia    Anxiety    Asthma    Bipolar disorder (HCC)    Chronic back pain    Pain Clinic in [place]   Chronic bronchitis    COPD (chronic obstructive pulmonary disease) ([place])  Coronary artery disease 2002   CABG (LIMA to LAD)   Depression    DVT (deep venous thrombosis) ([place]) ~ 2005   LLE   Frequency of urination    GERD (gastroesophageal reflux disease)    Headache(784.0)    History of seizures    Last 2011   HNP (herniated nucleus pulposus), cervical    Hyperlipidemia    Hypertension    MI (myocardial infarction) ([place])    Neuropathy    NSAID-induced gastric ulcer    Rheumatoid arthritis (HCC)    Tonsillitis, chronic    Dr. Vicki Mallet in [place]   Type II diabetes mellitus [facility])    Wears glasses    Past Surgical History:  Procedure Laterality Date   ANTERIOR CERVICAL DECOMP/DISCECTOMY FUSION N/A  11/05/2016   Procedure: Anterior Cervical Discectomy and Fusion - Cervical three-Cervical four;  Surgeon: Earnie Larsson, MD;  Location: [place];  Service: Neurosurgery;  Laterality: N/A;   BACK SURGERY     x3   BALLOON DILATION N/A 12/04/2019   Procedure: BALLOON DILATION;  Surgeon: Eloise Harman, DO;  [facility];  Service: Endoscopy;  Laterality: N/A;   BIOPSY N/A 05/30/2012   Procedure: BIOPSY;  Surgeon: Danie Binder, MD;  [facility];  Service: Endoscopy;  Laterality: N/A;   BIOPSY  03/02/2016   Procedure: BIOPSY;  Surgeon: Danie Binder, MD;  [facility];  Service: Endoscopy;;  gastric   BIOPSY  12/04/2019   Procedure: BIOPSY;  Surgeon: Eloise Harman, DO;  [facility];  Service: Endoscopy;;   CARDIAC CATHETERIZATION  "several"   CARPAL TUNNEL RELEASE Bilateral    CATARACT EXTRACTION W/PHACO Right 06/15/2016   Procedure: CATARACT EXTRACTION PHACO AND INTRAOCULAR LENS PLACEMENT ([place]);  Surgeon: Rutherford Guys, MD;  [facility];  Service: Ophthalmology;  Laterality: Right;  CDE: 4.64   CATARACT EXTRACTION W/PHACO Left 07/13/2016   Procedure: CATARACT EXTRACTION PHACO AND INTRAOCULAR LENS PLACEMENT (I[facility]);  Surgeon: Rutherford Guys, MD;  [facility];  Service: Ophthalmology;  Laterality: Left;  CDE: 3.15   COLONOSCOPY  12/28/2010   SLF: (MAC)Internal hemorrhoids/four small colon polyps tubular adenomas. per SLF: colonoscopy 2022   COLONOSCOPY WITH PROPOFOL N/A 07/05/2017   Surgeon: Danie Binder, MD; tubular adenoma, hyperplastic polyp, submucosal nodule at hepatic flexure consistent with lipoma on biopsy, diverticulosis and rectosigmoid and sigmoid colon, rectal bleeding due to large internal hemorrhoids.  Recommended repeat in 3 years due to history of polyps and oily film on scope.   CORONARY ARTERY BYPASS GRAFT  2002   3 vessels   ESOPHAGOGASTRODUODENOSCOPY N/A 05/30/2012   SLF: UNCONTROLLED GERD DUE TO LIFESTYLE CHOICE/WEIGHT  GAIN/MILD Non-erosive gastritis   ESOPHAGOGASTRODUODENOSCOPY (EGD) WITH PROPOFOL N/A 03/02/2016   Dr. Oneida Alar: Esophagus appeared normal, impaired dilation performed, patchy inflammation with edema and erythema of the entire stomach., Biopsy with H pylori, patient completed Pylera.    ESOPHAGOGASTRODUODENOSCOPY (EGD) WITH PROPOFOL N/A 12/04/2019   Surgeon: Eloise Harman, DO; benign appearing esophageal stenosis dilated, gastritis biopsied (mild reactive gastropathy, gastritis, negative H. pylori), normal duodenum.   FRACTURE SURGERY     LEFT HEART CATHETERIZATION WITH CORONARY ANGIOGRAM N/A 08/31/2011   Procedure: LEFT HEART CATHETERIZATION WITH CORONARY ANGIOGRAM;  Surgeon: Laverda Page, MD;  Location: [facility];  Service: Cardiovascular;  Laterality: N/A;   LUMBAR DISC SURGERY     "L4-5; Dr. Trenton Gammon"   LUMBAR LAMINECTOMY/DECOMPRESSION MICRODISCECTOMY Right 03/06/2019   Procedure: MICRODISCECTOMY EXTRAFORAMINAL LUMBAR THREE - LUMBAR  FOUR RIGHT;  Surgeon: Earnie Larsson, MD;  Location: [place];  Service: Neurosurgery;  Laterality: Right;  MICRODISCECTOMY EXTRAFORAMINAL LUMBAR THREE - LUMBAR FOUR RIGHT   MULTIPLE TOOTH EXTRACTIONS     ORIF MANDIBULAR FRACTURE N/A 12/19/2015   Procedure: OPEN REDUCTION INTERNAL FIXATION (ORIF) MANDIBULAR FRACTURE;  Surgeon: Izora Gala, MD;  Location: [place];  Service: ENT;  Laterality: N/A;   PATELLA FRACTURE SURGERY Left 1976   plate to knee cap from accident   POLYPECTOMY  07/05/2017   Procedure: POLYPECTOMY;  Surgeon: Danie Binder, MD;  [facility];  Service: Endoscopy;;  colon   SAVORY DILATION  12/28/2010   SLF:(MAC)J-shaped stomach/nodular mocosa in the distal esophagus/empiric dilation 34mm   SAVORY DILATION N/A 03/02/2016   Procedure: SAVORY DILATION;  Surgeon: Danie Binder, MD;  [facility];  Service: Endoscopy;  Laterality: N/A;   Social History:  reports that he has never smoked. He has never used smokeless tobacco. He  reports that he does not currently use drugs after having used the following drugs: "Crack" cocaine, Cocaine, and Marijuana. He reports that he does not drink alcohol.  Allergies  Allergen Reactions   Gabapentin Hives   Ibuprofen Other (See Comments)    Stomach  Upset and stomach ulcers   Zolpidem Tartrate     Other Reaction(s): Not available   Naproxen Other (See Comments)    HALLUCINATIONS    Family History  Problem Relation Age of Onset   Diabetes Mother    Hypertension Mother    Heart attack Mother 24   Hypertension Father    Diabetes Father    Heart attack Father 65   Heart attack Other        mother, father, brother, sister all deceased due to MI   Heart attack Sister    Heart attack Brother    Seizures Brother    Heart failure Other    Colon cancer Neg Hx    Liver disease Neg Hx    Anesthesia problems Neg Hx    Hypotension Neg Hx    Malignant hyperthermia Neg Hx    Pseudochol deficiency Neg Hx    Colon polyps Neg Hx     Prior to Admission medications   Medication Sig Start Date End Date Taking? Authorizing Provider  albuterol (PROVENTIL HFA;VENTOLIN HFA) 108 (90 Base) MCG/ACT inhaler Inhale 2 puffs into the lungs every 6 (six) hours as needed for wheezing or shortness of breath.    Yes [provider]  NARCAN 4 MG/0.1ML LIQD nasal spray kit Place 1 spray into the nose as needed (opioid overdose).  06/15/17  Yes [provider]  nitroGLYCERIN (NITROSTAT) 0.4 MG SL tablet Place 1 tablet (0.4 mg total) under the tongue every 5 (five) minutes as needed for chest pain. 08/26/21  Yes Johnson, Clanford L, MD  albuterol (PROVENTIL) (2.5 MG/3ML) 0.083% nebulizer solution Take 2.5 mg by nebulization every 6 (six) hours as needed for wheezing or shortness of breath.     [provider]  ALPRAZolam Duanne Moron) 1 MG tablet Take 1 mg by mouth 4 (four) times daily. 08/01/19   [provider]  amLODipine (NORVASC) 5 MG tablet TAKE 1 TABLET(5 MG) BY MOUTH  DAILY 04/07/20   Arnoldo Lenis, MD  atorvastatin (LIPITOR) 80 MG tablet Take 80 mg by mouth at bedtime. 12/05/20   [provider]  azelastine (ASTELIN) 0.1 % nasal spray Place 1 spray into both nostrils 2 (two) times daily. 12/29/21  [provider]  beclomethasone (QVAR) 80 MCG/ACT inhaler Inhale 2 puffs into the lungs 2 (two) times daily as needed (respiratory issues.).     [provider]  BELSOMRA 15 MG TABS Take 1 tablet by mouth at bedtime. 04/30/21   [provider]  benzonatate (TESSALON) 100 MG capsule Take 1 capsule (100 mg total) by mouth 3 (three) times daily as needed for cough. 01/02/22   Quintella Reichert, MD  brimonidine (ALPHAGAN P) 0.1 % SOLN Place 1 drop into the right eye 2 (two) times daily. 12/25/20   [provider]  cetirizine (ZYRTEC) 10 MG tablet Take 10 mg by mouth daily as needed for allergies.  01/26/16   [provider]  clopidogrel (PLAVIX) 75 MG tablet Take 75 mg by mouth daily.    [provider]  dexlansoprazole (DEXILANT) 60 MG capsule Take 1 capsule (60 mg total) by mouth daily. 07/05/17   Fields, Marga Melnick, MD  Evolocumab (REPATHA SURECLICK) [id number] MG/ML SOAJ Take 140 mg by mouth every 14 (fourteen) days. 03/10/22   Arnoldo Lenis, MD  ezetimibe (ZETIA) 10 MG tablet Take 10 mg by mouth daily.    [provider]  famotidine (PEPCID) 20 MG tablet Take 20 mg by mouth at bedtime. 12/29/21   [provider]  fluticasone (FLONASE) 50 MCG/ACT nasal spray Place 2 sprays into both nostrils daily.    [provider]  Fluticasone Furoate (ARNUITY ELLIPTA) 200 MCG/ACT AEPB Inhale 1 puff into the lungs daily as needed (shortness of breath).     [provider]  furosemide (LASIX) 40 MG tablet Take 1 tablet (40 mg total) by mouth daily. Can take an additional 40mg  tab as needed for swelling or weight gain 04/13/22   Marylu Lund., NP  glipiZIDE (GLUCOTROL) 10 MG tablet Take 10 mg  by mouth daily before breakfast.    [provider]  ipratropium (ATROVENT) 0.02 % nebulizer solution Take 0.5 mg by nebulization 2 (two) times daily as needed for wheezing or shortness of breath (for congestion).     [provider]  isosorbide mononitrate (IMDUR) 60 MG 24 hr tablet Take 1.5 tablets (90 mg total) by mouth daily. 05/11/22   Marylu Lund., NP  ketoconazole (NIZORAL) 2 % shampoo Apply 1 application topically daily as needed for irritation.  10/23/19   [provider]  latanoprost (XALATAN) 0.005 % ophthalmic solution Place 1 drop into both eyes at bedtime.    [provider]  LINZESS 290 MCG CAPS capsule TAKE 1 CAPSULE(290 MCG) BY MOUTH DAILY BEFORE BREAKFAST Patient taking differently: Take 290 mcg by mouth daily before breakfast. May take 1 additional capsule as needed for constipation 02/04/21   Mahala Menghini, PA-C  lisinopril-hydrochlorothiazide (ZESTORETIC) 10-12.5 MG tablet Take 1 tablet by mouth daily. 03/11/21   [provider]  magnesium citrate SOLN Take 1 Bottle by mouth as needed for moderate constipation.     [provider]  metFORMIN (GLUCOPHAGE-XR) 500 MG 24 hr tablet Take 500 mg by mouth daily with breakfast.     [provider]  methocarbamol (ROBAXIN) 500 MG tablet Take 1 tablet (500 mg total) by mouth every 6 (six) hours as needed. 11/24/21   Sherrell Puller, PA-C  metoprolol tartrate (LOPRESSOR) 50 MG tablet TAKE 1 TABLET(50 MG) BY MOUTH TWICE DAILY 02/04/22   Belva Crome, MD  MOUNJARO 2.5 MG/0.5ML Pen SMARTSIG:2.5 Milligram(s) SUB-Q Once a Week    [provider]  Multiple  Vitamin (MULTIVITAMIN WITH MINERALS) TABS tablet Take 1 tablet by mouth daily.    [provider]  naloxegol oxalate (MOVANTIK) 25 MG TABS tablet Take 1 tablet (25 mg total) by mouth daily. 08/21/19   Carlis Stable, NP  oxyCODONE-acetaminophen (PERCOCET) 10-325 MG tablet Take 1 tablet by mouth every 4 (four) hours  as needed for pain.    [provider]  potassium chloride SA (KLOR-CON) 20 MEQ tablet Take 20 mEq by mouth 2 (two) times daily.    [provider]  predniSONE (DELTASONE) 10 MG tablet Take 40 mg by mouth daily. 04/24/21   [provider]  sertraline (ZOLOFT) 100 MG tablet Take 100 mg by mouth daily.    [provider]  sildenafil (VIAGRA) 100 MG tablet Take by mouth as needed for erectile dysfunction. 03/11/22   [provider]  sitaGLIPtin (JANUVIA) 100 MG tablet Take 100 mg by mouth daily.    [provider]  sucralfate (CARAFATE) 1 GM/10ML suspension in the morning, at noon, in the evening, and at bedtime. 09/10/20   [provider]  tamsulosin (FLOMAX) 0.4 MG CAPS capsule Take 1 capsule (0.4 mg total) by mouth daily after breakfast. 01/07/16   Kerrie Buffalo, NP  TRULANCE 3 MG TABS Take 1 tablet by mouth daily.    [provider]  Vitamin D, Ergocalciferol, (DRISDOL) 1.25 MG (50000 UT) CAPS capsule Take 50,000 Units by mouth every 7 (seven) days. Monday    [provider]    Physical Exam: Vitals:   05/18/22 1730 05/18/22 1800 05/18/22 1943 05/18/22 1953  BP: 137/89 (!) 143/93  (!) 138/93  Pulse: 69 64  71  Resp: 19 13  20   Temp:    (!) 97.5 F (36.4 C)  TempSrc:    Oral  SpO2: 96% 96%  100%  Weight:   120.7 kg   Height:   5\' 10"  (1.778 m)    1.  General: Patient lying supine in bed,  no acute distress   2. Psychiatric: Alert and oriented x 3, mood and behavior normal for situation, pleasant and cooperative with exam   3. Neurologic: Speech and language are normal, face is symmetric, moves all 4 extremities voluntarily, at baseline without acute deficits on limited exam   4. HEENMT:  Head is atraumatic, normocephalic, pupils reactive to light, neck is supple, trachea is midline, mucous membranes are moist   5. Respiratory : Lungs are clear to auscultation bilaterally without wheezing, rhonchi,  rales, no cyanosis, no increase in work of breathing or accessory muscle use   6. Cardiovascular : Heart rate normal, rhythm is irregular, no murmurs, rubs or gallops, no peripheral edema, peripheral pulses palpated   7. Gastrointestinal:  Abdomen is soft, nondistended, nontender to palpation bowel sounds active, no masses or organomegaly palpated   8. Skin:  Skin is warm, dry and intact without rashes, acute lesions, or ulcers on limited exam   9.Musculoskeletal:  No acute deformities or trauma, no asymmetry in tone, no peripheral edema, peripheral pulses palpated, no tenderness to palpation in the extremities  Data Reviewed: In the ED Temp 97.5, heart rate 76, respiratory rate 16, blood pressure 123/62, maintaining oxygen sats on room air No leukocytosis, hemoglobin 11.9 Chemistries unremarkable Troponin undetectable D-dimer 0.47 CT head shows no acute findings Chest x-ray shows no active cardiopulmonary disease EKG shows sinus arrhythmia with a heart rate of 105, QTc 4 and 54 some T wave inversions Cardiology was consulted who said that if patient  has negative troponin x 2 he can stay at any [facility] for inpatient cardiology consult tomorrow  Assessment and Plan: * Chest pain - With associated dyspnea and syncope - Recently saw cardiology on March 19 where they discussed a Myoview showing no evidence of decreased perfusion but did show reduced LV function, they recommended follow-up with echocardiogram - Will get echo in the a.m. - Will continue Plavix, Lipitor, Zetia, Lopressor - Resume Repatha at discharge - EKG today showed a heart rate of 105, sinus arrhythmia, QTc 454 with some T wave inversions - Troponin undetectable - Cardiology was consulted from the ER and stated both tropes were negative patient can stay here with plans for inpatient cardiology consult in the a.m. - Appreciate recs from cardiology - D-dimer was 0.47 - Will monitor on telemetry  Syncope - Echo in the  a.m. - Carotid ultrasound in the a.m. - Orthostatics are not indicative of orthostatic hypotension etiology - Consult cardiology - Monitor on telemetry  Hyperlipidemia LDL goal <70 - Zetia, resume Repatha at discharge  MDD (major depressive disorder), recurrent episode, moderate ([place]) - Continue Zoloft  Type 2 diabetes mellitus with vascular disease ([place]) - CBGs with sliding scale coverage  Essential hypertension - Continue Imdur, lisinopril-hydrochlorothiazide, metoprolol - BP became very high during orthostatics, but recovered by the time patient was up to the inpatient bed  Gastroesophageal reflux disease without esophagitis - Continue PPI and Pepcid      Advance Care Planning:   Code Status: Full Code  Consults: Cardiology  Family Communication: Son at bedside  Severity of Illness: The appropriate patient status for this patient is OBSERVATION. Observation status is judged to be reasonable and necessary in order to provide the required intensity of service to ensure the patient's safety. The patient's presenting symptoms, physical exam findings, and initial radiographic and laboratory data in the context of their medical condition is felt to place them at decreased risk for further clinical deterioration. Furthermore, it is anticipated that the patient will be medically stable for discharge from the hospital within 2 midnights of admission.   Author: Rolla Plate, DO 05/18/2022 8:32 PM  For on call review [email or web address].

## 2022-05-19 ENCOUNTER — Observation Stay (HOSPITAL_COMMUNITY): Payer: 59

## 2022-05-19 ENCOUNTER — Observation Stay (HOSPITAL_BASED_OUTPATIENT_CLINIC_OR_DEPARTMENT_OTHER): Payer: 59

## 2022-05-19 DIAGNOSIS — I10 Essential (primary) hypertension: Secondary | ICD-10-CM | POA: Diagnosis not present

## 2022-05-19 DIAGNOSIS — R079 Chest pain, unspecified: Secondary | ICD-10-CM | POA: Diagnosis not present

## 2022-05-19 DIAGNOSIS — R0789 Other chest pain: Secondary | ICD-10-CM | POA: Diagnosis not present

## 2022-05-19 DIAGNOSIS — R55 Syncope and collapse: Secondary | ICD-10-CM | POA: Diagnosis not present

## 2022-05-19 DIAGNOSIS — K219 Gastro-esophageal reflux disease without esophagitis: Secondary | ICD-10-CM | POA: Diagnosis not present

## 2022-05-19 DIAGNOSIS — E119 Type 2 diabetes mellitus without complications: Secondary | ICD-10-CM | POA: Diagnosis not present

## 2022-05-19 LAB — CBC WITH DIFFERENTIAL/PLATELET
Abs Immature Granulocytes: 0 10*3/uL (ref 0.00–0.07)
Basophils Absolute: 0 10*3/uL (ref 0.0–0.1)
Basophils Relative: 0 %
Eosinophils Absolute: 0.2 10*3/uL (ref 0.0–0.5)
Eosinophils Relative: 5 %
HCT: 32.4 % — ABNORMAL LOW (ref 39.0–52.0)
Hemoglobin: 11 g/dL — ABNORMAL LOW (ref 13.0–17.0)
Immature Granulocytes: 0 %
Lymphocytes Relative: 49 %
Lymphs Abs: 1.6 10*3/uL (ref 0.7–4.0)
MCH: 29 pg (ref 26.0–34.0)
MCHC: 34 g/dL (ref 30.0–36.0)
MCV: 85.5 fL (ref 80.0–100.0)
Monocytes Absolute: 0.3 10*3/uL (ref 0.1–1.0)
Monocytes Relative: 10 %
Neutro Abs: 1.2 10*3/uL — ABNORMAL LOW (ref 1.7–7.7)
Neutrophils Relative %: 36 %
Platelets: 221 10*3/uL (ref 150–400)
RBC: 3.79 MIL/uL — ABNORMAL LOW (ref 4.22–5.81)
RDW: 13.7 % (ref 11.5–15.5)
WBC: 3.2 10*3/uL — ABNORMAL LOW (ref 4.0–10.5)
nRBC: 0 % (ref 0.0–0.2)

## 2022-05-19 LAB — ECHOCARDIOGRAM COMPLETE
Area-P 1/2: 4.36 cm2
Height: 70 in
S' Lateral: 3.3 cm
Weight: 4257.6 oz

## 2022-05-19 LAB — COMPREHENSIVE METABOLIC PANEL
ALT: 13 U/L (ref 0–44)
AST: 16 U/L (ref 15–41)
Albumin: 3.7 g/dL (ref 3.5–5.0)
Alkaline Phosphatase: 68 U/L (ref 38–126)
Anion gap: 6 (ref 5–15)
BUN: 11 mg/dL (ref 6–20)
CO2: 26 mmol/L (ref 22–32)
Calcium: 8.7 mg/dL — ABNORMAL LOW (ref 8.9–10.3)
Chloride: 104 mmol/L (ref 98–111)
Creatinine, Ser: 0.86 mg/dL (ref 0.61–1.24)
GFR, Estimated: >60 mL/min (ref >60)
Glucose, Bld: 84 mg/dL (ref 70–99)
Potassium: 3.5 mmol/L (ref 3.5–5.1)
Sodium: 136 mmol/L (ref 135–145)
Total Bilirubin: 1 mg/dL (ref 0.3–1.2)
Total Protein: 6.7 g/dL (ref 6.5–8.1)

## 2022-05-19 LAB — RAPID URINE DRUG SCREEN, HOSP PERFORMED
Amphetamines: NOT DETECTED
Barbiturates: NOT DETECTED
Benzodiazepines: POSITIVE — AB
Cocaine: NOT DETECTED
Opiates: NOT DETECTED
Tetrahydrocannabinol: NOT DETECTED

## 2022-05-19 LAB — TROPONIN I (HIGH SENSITIVITY): Troponin I (High Sensitivity): 3 ng/L (ref <18)

## 2022-05-19 LAB — MAGNESIUM: Magnesium: 2.1 mg/dL (ref 1.7–2.4)

## 2022-05-19 LAB — GLUCOSE, CAPILLARY
Glucose-Capillary: 95 mg/dL (ref 70–99)
Glucose-Capillary: 92 mg/dL (ref 70–99)
Glucose-Capillary: 102 mg/dL — ABNORMAL HIGH (ref 70–99)
Glucose-Capillary: 134 mg/dL — ABNORMAL HIGH (ref 70–99)

## 2022-05-19 MED ORDER — FUROSEMIDE 40 MG PO TABS
40.0000 mg | ORAL_TABLET | Freq: Every day | ORAL | Status: DC
  Administered 2022-05-19 – 2022-05-20 (×2): 40 mg via ORAL
  Filled 2022-05-19 (×2): qty 1

## 2022-05-19 MED ORDER — LIDOCAINE 5 % EX PTCH
1.0000 | MEDICATED_PATCH | CUTANEOUS | Status: DC
  Administered 2022-05-19: 1 via TRANSDERMAL
  Filled 2022-05-19: qty 1

## 2022-05-19 NOTE — Progress Notes (Signed)
  Transition of Care [facility]) Screening Note   Patient Details  Name: Dalton [name] Date of Birth: 07-23-1960   Transition of Care [facility]) CM/SW Contact:    Ihor Gully, LCSW Phone Number: 05/19/2022, 12:27 PM    Transition of Care Department [facility]) has reviewed patient and no TOC needs have been identified at this time. We will continue to monitor patient advancement through interdisciplinary progression rounds. If new patient transition needs arise, please place a TOC consult.

## 2022-05-19 NOTE — Plan of Care (Signed)
Patient received awake and alert, ambulates to bathroom independently, no c/o pain or SOB this am, took all medications, and echocardiogram performed at bedside this am.   Problem: Education: Goal: Knowledge of General Education information will improve Description: Including pain rating scale, medication(s)/side effects and non-pharmacologic comfort measures Outcome: Progressing   Problem: Health Behavior/Discharge Planning: Goal: Ability to manage health-related needs will improve Outcome: Progressing   Problem: Clinical Measurements: Goal: Ability to maintain clinical measurements within normal limits will improve Outcome: Progressing Goal: Will remain free from infection Outcome: Progressing Goal: Diagnostic test results will improve Outcome: Progressing Goal: Respiratory complications will improve Outcome: Progressing Goal: Cardiovascular complication will be avoided Outcome: Progressing   Problem: Activity: Goal: Risk for activity intolerance will decrease Outcome: Progressing   Problem: Nutrition: Goal: Adequate nutrition will be maintained Outcome: Progressing   Problem: Coping: Goal: Level of anxiety will decrease Outcome: Progressing   Problem: Elimination: Goal: Will not experience complications related to bowel motility Outcome: Progressing Goal: Will not experience complications related to urinary retention Outcome: Progressing   Problem: Pain Managment: Goal: General experience of comfort will improve Outcome: Progressing   Problem: Safety: Goal: Ability to remain free from injury will improve Outcome: Progressing   Problem: Skin Integrity: Goal: Risk for impaired skin integrity will decrease Outcome: Progressing   Problem: Education: Goal: Knowledge of General Education information will improve Description: Including pain rating scale, medication(s)/side effects and non-pharmacologic comfort measures Outcome: Progressing   Problem: Health  Behavior/Discharge Planning: Goal: Ability to manage health-related needs will improve Outcome: Progressing   Problem: Clinical Measurements: Goal: Ability to maintain clinical measurements within normal limits will improve Outcome: Progressing Goal: Will remain free from infection Outcome: Progressing Goal: Diagnostic test results will improve Outcome: Progressing Goal: Respiratory complications will improve Outcome: Progressing Goal: Cardiovascular complication will be avoided Outcome: Progressing   Problem: Activity: Goal: Risk for activity intolerance will decrease Outcome: Progressing   Problem: Nutrition: Goal: Adequate nutrition will be maintained Outcome: Progressing   Problem: Coping: Goal: Level of anxiety will decrease Outcome: Progressing   Problem: Elimination: Goal: Will not experience complications related to bowel motility Outcome: Progressing Goal: Will not experience complications related to urinary retention Outcome: Progressing   Problem: Pain Managment: Goal: General experience of comfort will improve Outcome: Progressing   Problem: Safety: Goal: Ability to remain free from injury will improve Outcome: Progressing   Problem: Skin Integrity: Goal: Risk for impaired skin integrity will decrease Outcome: Progressing

## 2022-05-19 NOTE — Progress Notes (Signed)
PROGRESS NOTE   Dalton [name]  [id number] DOB: [birth year] DOA: 05/18/2022 PCP: Nolene Ebbs, MD   Chief Complaint  Patient presents with   Palpitations   Level of care: Telemetry  Brief Admission History:  62 y.o. male with medical history significant of heart failure with preserved ejection fraction, anxiety, depression, COPD, history of DVT, GERD, hyperlipidemia, hypertension, CABG x 1 in 2000, type 2 diabetes, and more presents the ED with a chief complaint of syncopal episodes.  Patient reports that all of his cardiology team is in [place].  He saw them recently, and they were scheduling him for an echo.  He reports he was seeing them because his blood pressure has been going up and down, and that his heart has been slowing down per his report.  According to the cardiology note he was presenting for 1 month follow-up after complaining of ongoing chest pain.  During that appointment they discussed a Myoview that showed decrease in LV function, so they recommended an echo.  Patient presents today because he had several syncopal events, insomnia, and decreased appetite.  He reports he has still been compliant with all of his medications despite the symptoms.  Patient reports that he had a syncopal episode about 4 days ago, 2 syncopal episodes 2 days ago, and 1 today.  He reports that 1 today happen when he was at rest sitting down, started to have chest pain and shortness of breath, so he stood up to try to get his circulation going and was planning to walk around.  Instead he woke up on the floor.  He reports it took 10 to 15 minutes for him to regain his strength to stand back up.  Son at bedside reports that he was out for approximately 30 seconds.  Patient reports that sometimes he does feel dizzy before this happens, but he did not today.  He reports that he did have a chest pain and dyspnea.  He describes the chest pain as being in the center of his chest and feeling like a heart  attack.  He describes it as sharp pain.  He feels clogged so that he cannot breathe.  Other episodes that he had previously were falling out of bed or falling when he tried to get up, all with loss of consciousness per his report.  The only other abnormal thing for him lately is URI symptoms.  He has had a cough, subjective fever, and mucus per his report.  He is taking TheraFlu and it has helped.  He denies dysuria and diarrhea.  He does report foul-smelling urine.  He denies any hematuria, hematochezia, melena.  He complains of his decreased appetite, but he is also on Mounjaro.  Patient has no other complaints at this time.    Assessment and Plan: * Chest pain - With associated dyspnea and syncope - Recently saw cardiology on March 19 where they discussed a Myoview showing no evidence of decreased perfusion but did show reduced LV function, they recommended follow-up with echocardiogram - Will follow up TTE results. - Will continue Plavix, Lipitor, Zetia, Lopressor - Resume Repatha at discharge - EKG on admission showed a heart rate of 105, sinus arrhythmia, QTc 454 with some T wave inversions - Troponin undetectable - Cardiology was consulted  - Appreciate recs from cardiology - D-dimer was 0.47 - Will monitor on telemetry -- PT evaluation requested   Syncope - Follow up TTE and get a PT evaluation . - Carotid ultrasound to eval for  hemodynamically significant stenosis  - Orthostatics are not indicative of orthostatic hypotension etiology - Consult cardiology appreciated  - Monitor on telemetry  Hyperlipidemia LDL goal <70 - Zetia, resume Repatha at discharge  MDD (major depressive disorder), recurrent episode, moderate ([place]) - Continue Zoloft  Type 2 diabetes mellitus with vascular disease ([place]) - CBGs with sliding scale coverage CBG (last 3)  Recent Labs    05/19/22 0741 05/19/22 1110 05/19/22 1610  GLUCAP 95 92 102*     Essential hypertension - Continue Imdur,  lisinopril-hydrochlorothiazide, metoprolol   Gastroesophageal reflux disease without esophagitis - Continue PPI and Pepcid   DVT prophylaxis: [place] heparin Code Status: full     Consultants:  cardiology Procedures:   Antimicrobials:    Subjective: Pt reports occasional bouts of chest discomfort overnight.   Objective: Vitals:   05/18/22 1953 05/18/22 2053 05/19/22 0500 05/19/22 1259  BP: (!) 138/93 137/84 130/82 (!) 97/59  Pulse: 71 80 74 68  Resp: 20 19 18 19   Temp: (!) 97.5 F (36.4 C) (!) 97.4 F (36.3 C) 98.4 F (36.9 C) 98.5 F (36.9 C)  TempSrc: Oral Oral Oral Oral  SpO2: 100% 96% 98% 97%  Weight:      Height:        Intake/Output Summary (Last 24 hours) at 05/19/2022 1813 Last data filed at 05/19/2022 1300 Gross per 24 hour  Intake 720 ml  Output --  Net 720 ml   Filed Weights   05/18/22 1417 05/18/22 1943  Weight: 125.2 kg 120.7 kg   Examination:  General exam: Appears calm and comfortable  Respiratory system: Clear to auscultation. Respiratory effort normal. Cardiovascular system: normal S1 & S2 heard. No JVD, murmurs, rubs, gallops or clicks. No pedal edema. Gastrointestinal system: Abdomen is nondistended, soft and nontender. No organomegaly or masses felt. Normal bowel sounds heard. Central nervous system: Alert and oriented. No focal neurological deficits. Extremities: Symmetric 5 x 5 power. Skin: No rashes, lesions or ulcers. Psychiatry: Judgement and insight appear normal. Mood & affect appropriate.   Data Reviewed: I have personally reviewed following labs and imaging studies  CBC: Recent Labs  Lab 05/18/22 1441 05/19/22 0426  WBC 4.8 3.2*  NEUTROABS  --  1.2*  HGB 11.9* 11.0*  HCT 35.8* 32.4*  MCV 86.7 85.5  PLT 262 [id number]    Basic Metabolic Panel: Recent Labs  Lab 05/18/22 1441 05/19/22 0426  NA 136 136  K 3.7 3.5  CL 100 104  CO2 26 26  GLUCOSE 104* 84  BUN 15 11  CREATININE 1.03 0.86  CALCIUM 9.3 8.7*  MG  --  2.1     CBG: Recent Labs  Lab 05/18/22 2056 05/19/22 0741 05/19/22 1110 05/19/22 1610  GLUCAP 97 95 92 102*    No results found for this or any previous visit (from the past 240 hour(s)).   Radiology Studies: ECHOCARDIOGRAM COMPLETE  Result Date: 05/19/2022    ECHOCARDIOGRAM REPORT   Patient Name:   Dalton [name] Date of Exam: 05/19/2022 Medical Rec #:  [id number]         Height:       70.0 in Accession #:    [id number]        Weight:       266.1 lb Date of Birth:  September 22, 1960         BSA:          2.357 m Patient Age:    44 years  BP:           130/82 mmHg Patient Gender: M                 HR:           86 bpm. Exam Location:  [facility] Procedure: 2D Echo, Cardiac Doppler, Color Doppler and Intracardiac            Opacification Agent Indications:    Syncope R55  History:        Patient has prior history of Echocardiogram examinations, most                 recent 11/10/2021. CHF, CAD and Previous Myocardial Infarction,                 Signs/Symptoms:Chest Pain and Syncope; Risk                 Factors:Hypertension, Diabetes, Dyslipidemia and Non-Smoker.  Sonographer:    Greer Pickerel Referring Phys: [id number] ASIA B Molena  Sonographer Comments: Image acquisition challenging due to patient body habitus, Image acquisition challenging due to COPD and Image acquisition challenging due to respiratory motion. IMPRESSIONS  1. Left ventricular ejection fraction, by estimation, is 50 to 55%. The left ventricle has low normal function. The left ventricle has no regional wall motion abnormalities. Left ventricular diastolic parameters are consistent with Grade I diastolic dysfunction (impaired relaxation).  2. Right ventricular systolic function is low normal. The right ventricular size is normal. There is normal pulmonary artery systolic pressure. The estimated right ventricular systolic pressure is 99991111 mmHg.  3. The mitral valve is grossly normal. Trivial mitral valve regurgitation.  4. The aortic  valve is tricuspid. Aortic valve regurgitation is not visualized.  5. The inferior vena cava is normal in size with greater than 50% respiratory variability, suggesting right atrial pressure of 3 mmHg. Comparison(s): Prior images reviewed side by side. LVEF low normal range at 50-55%. FINDINGS  Left Ventricle: Left ventricular ejection fraction, by estimation, is 50 to 55%. The left ventricle has low normal function. The left ventricle has no regional wall motion abnormalities. Definity contrast agent was given IV to delineate the left ventricular endocardial borders. The left ventricular internal cavity size was normal in size. There is borderline left ventricular hypertrophy. Left ventricular diastolic parameters are consistent with Grade I diastolic dysfunction (impaired relaxation). Right Ventricle: The right ventricular size is normal. No increase in right ventricular wall thickness. Right ventricular systolic function is low normal. There is normal pulmonary artery systolic pressure. The tricuspid regurgitant velocity is 1.63 m/s,  and with an assumed right atrial pressure of 3 mmHg, the estimated right ventricular systolic pressure is 99991111 mmHg. Left Atrium: Left atrial size was normal in size. Right Atrium: Right atrial size was normal in size. Pericardium: There is no evidence of pericardial effusion. Mitral Valve: The mitral valve is grossly normal. Trivial mitral valve regurgitation. Tricuspid Valve: The tricuspid valve is grossly normal. Tricuspid valve regurgitation is trivial. Aortic Valve: The aortic valve is tricuspid. There is mild aortic valve annular calcification. Aortic valve regurgitation is not visualized. Pulmonic Valve: The pulmonic valve was grossly normal. Pulmonic valve regurgitation is trivial. Aorta: The aortic root is normal in size and structure. Venous: The inferior vena cava is normal in size with greater than 50% respiratory variability, suggesting right atrial pressure of 3 mmHg.  IAS/Shunts: No atrial level shunt detected by color flow Doppler.  LEFT VENTRICLE PLAX 2D LVIDd:  4.50 cm   Diastology LVIDs:         3.30 cm   LV e' medial:    7.40 cm/s LV PW:         1.00 cm   LV E/e' medial:  9.2 LV IVS:        0.90 cm   LV e' lateral:   7.72 cm/s LVOT diam:     2.30 cm   LV E/e' lateral: 8.8 LV SV:         72 LV SV Index:   30 LVOT Area:     4.15 cm  RIGHT VENTRICLE RV S prime:     7.04 cm/s TAPSE (M-mode): 1.2 cm LEFT ATRIUM             Index        RIGHT ATRIUM           Index LA diam:        4.20 cm 1.78 cm/m   RA Area:     15.60 cm LA Vol (A2C):   54.3 ml 23.04 ml/m  RA Volume:   35.30 ml  14.98 ml/m LA Vol (A4C):   52.4 ml 22.23 ml/m LA Biplane Vol: 54.2 ml 23.00 ml/m  AORTIC VALVE             PULMONIC VALVE LVOT Vmax:   94.30 cm/s  PR End Diast Vel: 9.61 msec LVOT Vmean:  60.800 cm/s LVOT VTI:    0.173 m  AORTA Ao Root diam: 3.50 cm Ao Asc diam:  2.70 cm MITRAL VALVE               TRICUSPID VALVE MV Area (PHT): 4.36 cm    TR Peak grad:   10.6 mmHg MV Decel Time: 174 msec    TR Vmax:        163.00 cm/s MV E velocity: 68.10 cm/s MV A velocity: 78.80 cm/s  SHUNTS MV E/A ratio:  0.86        Systemic VTI:  0.17 m                            Systemic Diam: 2.30 cm Rozann Lesches MD Electronically signed by Rozann Lesches MD Signature Date/Time: 05/19/2022/11:06:49 AM    Final    US Carotid Bilateral  Result Date: 05/19/2022 CLINICAL DATA:  Hypertension, syncope, coronary artery disease EXAM: BILATERAL CAROTID DUPLEX ULTRASOUND TECHNIQUE: Pearline Cables scale imaging, color Doppler and duplex ultrasound were performed of bilateral carotid and vertebral arteries in the neck. COMPARISON:  None Available. FINDINGS: Criteria: Quantification of carotid stenosis is based on velocity parameters that correlate the residual internal carotid diameter with NASCET-based stenosis levels, using the diameter of the distal internal carotid lumen as the denominator for stenosis measurement. The  following velocity measurements were obtained: RIGHT ICA: 76/28 cm/sec CCA: [id number] cm/sec SYSTOLIC ICA/CCA RATIO:  0.5 ECA: 182 cm/sec LEFT ICA: 89/34 cm/sec CCA: [id number] cm/sec SYSTOLIC ICA/CCA RATIO:  0.7 ECA: 137 cm/sec RIGHT CAROTID ARTERY: Mild tortuosity. Smooth intimal thickening in the distal common carotid artery. Minimal plaque in the bulb. No significant stenosis. Normal waveforms and color Doppler signal throughout. RIGHT VERTEBRAL ARTERY:  Normal flow direction and waveform. LEFT CAROTID ARTERY: Minimal smooth plaque in the bulb. Normal waveforms and color Doppler signal throughout. No significant stenosis. LEFT VERTEBRAL ARTERY:  Normal flow direction and waveform. IMPRESSION: 1. Minimal bilateral carotid bifurcation plaque resulting in less than 50% diameter ICA stenosis. 2.  Antegrade bilateral vertebral arterial flow. Electronically Signed   By: Lucrezia Europe M.D.   On: 05/19/2022 10:44   CT Head Wo Contrast  Result Date: 05/18/2022 CLINICAL DATA:  Neuro deficit, acute, stroke suspected EXAM: CT HEAD WITHOUT CONTRAST TECHNIQUE: Contiguous axial images were obtained from the base of the skull through the vertex without intravenous contrast. RADIATION DOSE REDUCTION: This exam was performed according to the departmental dose-optimization program which includes automated exposure control, adjustment of the mA and/or kV according to patient size and/or use of iterative reconstruction technique. COMPARISON:  CT head December 10, 2015. FINDINGS: Brain: No evidence of acute infarction, hemorrhage, hydrocephalus, extra-axial collection or mass lesion/mass effect. Vascular: No hyperdense vessel identified. Skull: No acute fracture. Sinuses/Orbits: Clear sinuses.  No acute orbital findings. Other: No mastoid effusions. IMPRESSION: No evidence of acute intracranial abnormality. Electronically Signed   By: Margaretha Sheffield M.D.   On: 05/18/2022 17:05   DG Chest 2 View  Result Date: 05/18/2022 CLINICAL DATA:   Chest pain and shortness of breath EXAM: CHEST - 2 VIEW COMPARISON:  Chest x-ray dated January 01, 2022 FINDINGS: The heart size and mediastinal contours are within normal limits. Prior median sternotomy. Vascular stent overlying the left upper lobe. Both lungs are clear. The visualized skeletal structures are unremarkable. IMPRESSION: No active cardiopulmonary disease. Electronically Signed   By: Yetta Glassman M.D.   On: 05/18/2022 14:44    Scheduled Meds:  ALPRAZolam  1 mg Oral TID   atorvastatin  80 mg Oral Daily   clopidogrel  75 mg Oral Daily   ezetimibe  10 mg Oral Daily   famotidine  20 mg Oral QHS   furosemide  40 mg Oral Daily   heparin  5,000 Units Subcutaneous Q8H   insulin aspart  0-15 Units Subcutaneous TID WC   insulin aspart  0-5 Units Subcutaneous QHS   lidocaine  1-2 patch Transdermal Q24H   lisinopril  10 mg Oral Daily   metoprolol tartrate  50 mg Oral BID   pantoprazole  40 mg Oral Daily   sertraline  100 mg Oral Daily   tamsulosin  0.4 mg Oral QPC breakfast   Continuous Infusions:   LOS: 0 days   Time spent: 35 mins  Takara Sermons Wynetta Emery, MD How to contact the [facility] Attending or Consulting provider [place] or covering provider during after hours [place], for this patient?  Check the care team in [facility] and look for a) attending/consulting [facility] provider listed and b) the [facility] team listed Log into [email or web address] and use [place]'s universal password to access. If you do not have the password, please contact the hospital operator. Locate the [facility] provider you are looking for under [facility] and page to a number that you can be directly reached. If you still have difficulty reaching the provider, please page the [facility] (Director on Call) for the Hospitalists listed on amion for assistance.  05/19/2022, 6:13 PM

## 2022-05-19 NOTE — Hospital Course (Signed)
62 y.o. male with medical history significant of heart failure with preserved ejection fraction, anxiety, depression, COPD, history of DVT, GERD, hyperlipidemia, hypertension, CABG x 1 in 2000, type 2 diabetes, and more presents the ED with a chief complaint of syncopal episodes.  Patient reports that all of his cardiology team is in [place].  He saw them recently, and they were scheduling him for an echo.  He reports he was seeing them because his blood pressure has been going up and down, and that his heart has been slowing down per his report.  According to the cardiology note he was presenting for 1 month follow-up after complaining of ongoing chest pain.  During that appointment they discussed a Myoview that showed decrease in LV function, so they recommended an echo.  Patient presents today because he had several syncopal events, insomnia, and decreased appetite.  He reports he has still been compliant with all of his medications despite the symptoms.  Patient reports that he had a syncopal episode about 4 days ago, 2 syncopal episodes 2 days ago, and 1 today.  He reports that 1 today happen when he was at rest sitting down, started to have chest pain and shortness of breath, so he stood up to try to get his circulation going and was planning to walk around.  Instead he woke up on the floor.  He reports it took 10 to 15 minutes for him to regain his strength to stand back up.  Son at bedside reports that he was out for approximately 30 seconds.  Patient reports that sometimes he does feel dizzy before this happens, but he did not today.  He reports that he did have a chest pain and dyspnea.  He describes the chest pain as being in the center of his chest and feeling like a heart attack.  He describes it as sharp pain.  He feels clogged so that he cannot breathe.  Other episodes that he had previously were falling out of bed or falling when he tried to get up, all with loss of consciousness per his report.   The only other abnormal thing for him lately is URI symptoms.  He has had a cough, subjective fever, and mucus per his report.  He is taking TheraFlu and it has helped.  He denies dysuria and diarrhea.  He does report foul-smelling urine.  He denies any hematuria, hematochezia, melena.  He complains of his decreased appetite, but he is also on Mounjaro.  Patient has no other complaints at this time.

## 2022-05-19 NOTE — Care Management Obs Status (Signed)
[place] NOTIFICATION   Patient Details  Name: Dalton [name] MRN: [id number] Date of Birth: 11/12/1960   Medicare Observation Status Notification Given:  Yes    [name] 05/19/2022, 3:17 PM

## 2022-05-19 NOTE — Consult Note (Addendum)
Cardiology Consultation   Patient ID: YOGI COLLA MRN: [id number]; DOB: 11-Dec-1960  Admit date: 05/18/2022 Date of Consult: 05/19/2022  PCP:  Nolene Ebbs, MD   [facility] Providers Cardiologist: Previously Dr. Tamala Julian  Sleep Medicine:  Fransico Him, MD       Patient Profile:   Dalton [name] is a 62 y.o. 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 Wynetta Emery.  History of Present Illness:   Dalton [name] was recently evaluated by Ambrose Pancoast, NP earlier this year for preoperative cardiac clearance and underwent a nuclear stress test in 03/2022 which showed no evidence of ischemia and was overall a low-risk study.  His EF was reduced at 47% by NST, therefore an echocardiogram was recommended for further evaluation.  He presented to [facility] ED on 05/18/2022 for evaluation of chest pain for the past 2 days. Also reported occasional syncopal episodes. In talking with the patient and his son today, he reports having dyspnea on exertion which has been most notable when walking outside. He walks to most places from his apartment and estimates he walks over a mile a day. Reports some chest discomfort with this but also notes chest discomfort has occurred at rest as well. No recent orthopnea, PND or pitting edema.  Over this past weekend, he did have 2 syncopal episodes which occurred while sitting on the edge of his bed. Unaware of any prodromal symptoms and he did not lose control of his bowel or bladder. His son estimates that he lost  consciousness for approximately 15 to 20 minutes. Denies any tobacco use or recreational drug use. Reports occasional alcohol consumption but no binge drinking.  Initial labs showed WBC 4.8, Hgb 11.9, platelets 262, Na+ 136, K+ 3.7 and creatinine 1.03.  Initial and repeat troponin values negative at 2, 2 and 3. D-dimer negative. CXR with no active cardiopulmonary disease. CT Head with no evidence of acute intracranial abnormalities. EKG showed sinus tachycardia, heart rate 105 with PAC's and TWI along the inferior and lateral leads which is similar to prior tracings.  Possible short burst of SVT. He was admitted for further management and is scheduled for an echocardiogram and carotid dopplers today.   Past Medical History:  Diagnosis Date   (HFpEF) heart failure with preserved ejection fraction (HCC)    Anemia    Anxiety    Asthma    Bipolar disorder ([place])    Chronic back pain    Pain Clinic in [place]   Chronic bronchitis    COPD (chronic obstructive pulmonary disease) ([place])    Coronary artery disease 2002   CABG (LIMA to LAD)   Depression    DVT (deep venous thrombosis) ([place]) ~ 2005   LLE   Frequency of urination    GERD (gastroesophageal reflux disease)    Headache(784.0)    History of seizures    Last 2011   HNP (herniated nucleus pulposus), cervical    Hyperlipidemia    Hypertension    MI (myocardial infarction) ([place])  Neuropathy    NSAID-induced gastric ulcer    Rheumatoid arthritis (HCC)    Tonsillitis, chronic    Dr. Vicki Mallet in [place]   Type II diabetes mellitus [facility])    Wears glasses     Past Surgical History:  Procedure Laterality Date   ANTERIOR CERVICAL DECOMP/DISCECTOMY FUSION N/A 11/05/2016   Procedure: Anterior Cervical Discectomy and Fusion - Cervical three-Cervical four;  Surgeon: Earnie Larsson, MD;  Location: [place];  Service: Neurosurgery;  Laterality: N/A;   BACK SURGERY     x3   BALLOON DILATION N/A 12/04/2019   Procedure: BALLOON DILATION;   Surgeon: Eloise Harman, DO;  [facility];  Service: Endoscopy;  Laterality: N/A;   BIOPSY N/A 05/30/2012   Procedure: BIOPSY;  Surgeon: Danie Binder, MD;  Location[facility];  Service: Endoscopy;  Laterality: N/A;   BIOPSY  03/02/2016   Procedure: BIOPSY;  Surgeon: Danie Binder, MD;  [facility];  Service: Endoscopy;;  gastric   BIOPSY  12/04/2019   Procedure: BIOPSY;  Surgeon: Eloise Harman, DO;  [facility];  Service: Endoscopy;;   CARDIAC CATHETERIZATION  "several"   CARPAL TUNNEL RELEASE Bilateral    CATARACT EXTRACTION W/PHACO Right 06/15/2016   Procedure: CATARACT EXTRACTION PHACO AND INTRAOCULAR LENS PLACEMENT ([place]);  Surgeon: Rutherford Guys, MD;  Location: [facility];  Service: Ophthalmology;  Laterality: Right;  CDE: 4.64   CATARACT EXTRACTION W/PHACO Left 07/13/2016   Procedure: CATARACT EXTRACTION PHACO AND INTRAOCULAR LENS PLACEMENT ([facility]);  Surgeon: Rutherford Guys, MD;  [facility];  Service: Ophthalmology;  Laterality: Left;  CDE: 3.15   COLONOSCOPY  12/28/2010   SLF: (MAC)Internal hemorrhoids/four small colon polyps tubular adenomas. per SLF: colonoscopy 2022   COLONOSCOPY WITH PROPOFOL N/A 07/05/2017   Surgeon: Danie Binder, MD; tubular adenoma, hyperplastic polyp, submucosal nodule at hepatic flexure consistent with lipoma on biopsy, diverticulosis and rectosigmoid and sigmoid colon, rectal bleeding due to large internal hemorrhoids.  Recommended repeat in 3 years due to history of polyps and oily film on scope.   CORONARY ARTERY BYPASS GRAFT  2002   3 vessels   ESOPHAGOGASTRODUODENOSCOPY N/A 05/30/2012   SLF: UNCONTROLLED GERD DUE TO LIFESTYLE CHOICE/WEIGHT GAIN/MILD Non-erosive gastritis   ESOPHAGOGASTRODUODENOSCOPY (EGD) WITH PROPOFOL N/A 03/02/2016   Dr. Oneida Alar: Esophagus appeared normal, impaired dilation performed, patchy inflammation with edema and erythema of the entire stomach., Biopsy with H pylori, patient completed  Pylera.    ESOPHAGOGASTRODUODENOSCOPY (EGD) WITH PROPOFOL N/A 12/04/2019   Surgeon: Eloise Harman, DO; benign appearing esophageal stenosis dilated, gastritis biopsied (mild reactive gastropathy, gastritis, negative H. pylori), normal duodenum.   FRACTURE SURGERY     LEFT HEART CATHETERIZATION WITH CORONARY ANGIOGRAM N/A 08/31/2011   Procedure: LEFT HEART CATHETERIZATION WITH CORONARY ANGIOGRAM;  Surgeon: Laverda Page, MD;  Location: [facility];  Service: Cardiovascular;  Laterality: N/A;   LUMBAR DISC SURGERY     "L4-5; Dr. Trenton Gammon"   LUMBAR LAMINECTOMY/DECOMPRESSION MICRODISCECTOMY Right 03/06/2019   Procedure: MICRODISCECTOMY EXTRAFORAMINAL LUMBAR THREE - LUMBAR FOUR RIGHT;  Surgeon: Earnie Larsson, MD;  Location: [place];  Service: Neurosurgery;  Laterality: Right;  MICRODISCECTOMY EXTRAFORAMINAL LUMBAR THREE - LUMBAR FOUR RIGHT   MULTIPLE TOOTH EXTRACTIONS     ORIF MANDIBULAR FRACTURE N/A 12/19/2015   Procedure: OPEN REDUCTION INTERNAL FIXATION (ORIF) MANDIBULAR FRACTURE;  Surgeon: Izora Gala, MD;  Location: [place];  Service: ENT;  Laterality: N/A;   PATELLA FRACTURE SURGERY Left 1976   plate to knee  cap from accident   POLYPECTOMY  07/05/2017   Procedure: POLYPECTOMY;  Surgeon: Danie Binder, MD;  [facility];  Service: Endoscopy;;  colon   SAVORY DILATION  12/28/2010   SLF:(MAC)J-shaped stomach/nodular mocosa in the distal esophagus/empiric dilation 33mm   SAVORY DILATION N/A 03/02/2016   Procedure: SAVORY DILATION;  Surgeon: Danie Binder, MD;  [facility];  Service: Endoscopy;  Laterality: N/A;     Home Medications:  Prior to Admission medications   Medication Sig Start Date End Date Taking? Authorizing Provider  albuterol (PROVENTIL HFA;VENTOLIN HFA) 108 (90 Base) MCG/ACT inhaler Inhale 2 puffs into the lungs every 6 (six) hours as needed for wheezing or shortness of breath.    Yes [provider]  albuterol (PROVENTIL) (2.5 MG/3ML) 0.083%  nebulizer solution Take 2.5 mg by nebulization every 6 (six) hours as needed for wheezing or shortness of breath.    Yes [provider]  ALPRAZolam Duanne Moron) 1 MG tablet Take 1 mg by mouth 4 (four) times daily. 08/01/19  Yes [provider]  atorvastatin (LIPITOR) 80 MG tablet Take 80 mg by mouth at bedtime. 12/05/20  Yes [provider]  azelastine (ASTELIN) 0.1 % nasal spray Place 1 spray into both nostrils 2 (two) times daily. 12/29/21  Yes [provider]  beclomethasone (QVAR) 80 MCG/ACT inhaler Inhale 2 puffs into the lungs 2 (two) times daily as needed (respiratory issues.).    Yes [provider]  BELSOMRA 15 MG TABS Take 1 tablet by mouth at bedtime. 04/30/21  Yes [provider]  benzonatate (TESSALON) 100 MG capsule Take 1 capsule (100 mg total) by mouth 3 (three) times daily as needed for cough. 01/02/22  Yes Quintella Reichert, MD  brimonidine (ALPHAGAN P) 0.1 % SOLN Place 1 drop into the right eye 2 (two) times daily. 12/25/20  Yes [provider]  cetirizine (ZYRTEC) 10 MG tablet Take 10 mg by mouth daily as needed for allergies.  01/26/16  Yes [provider]  clopidogrel (PLAVIX) 75 MG tablet Take 75 mg by mouth daily.   Yes [provider]  dexlansoprazole (DEXILANT) 60 MG capsule Take 1 capsule (60 mg total) by mouth daily. 07/05/17  Yes Fields, Sandi L, MD  Evolocumab (REPATHA SURECLICK) [id number] MG/ML SOAJ Take 140 mg by mouth every 14 (fourteen) days. 03/10/22  Yes BranchAlphonse Guild, MD  ezetimibe (ZETIA) 10 MG tablet Take 10 mg by mouth daily.   Yes [provider]  famotidine (PEPCID) 20 MG tablet Take 20 mg by mouth at bedtime. 12/29/21  Yes [provider]  fluticasone (FLONASE) 50 MCG/ACT nasal spray Place 2 sprays into both nostrils daily.   Yes [provider]  Fluticasone Furoate (ARNUITY ELLIPTA) 200 MCG/ACT AEPB Inhale 1 puff into the lungs daily as needed (shortness of breath).     Yes [provider]  furosemide (LASIX) 40 MG tablet Take 1 tablet (40 mg total) by mouth daily. Can take an additional 40mg  tab as needed for swelling or weight gain 04/13/22  Yes Marylu Lund., NP  glipiZIDE (GLUCOTROL) 10 MG tablet Take 10 mg by mouth daily before breakfast.   Yes [provider]  ipratropium (ATROVENT) 0.02 % nebulizer solution Take 0.5 mg by nebulization 2 (two) times daily as needed for wheezing or shortness of breath (for congestion).    Yes [provider]  isosorbide mononitrate (IMDUR) 60 MG 24 hr tablet Take 1.5 tablets (90 mg total) by mouth  daily. 05/11/22  Yes Marylu Lund., NP  ketoconazole (NIZORAL) 2 % shampoo Apply 1 application topically daily as needed for irritation.  10/23/19  Yes [provider]  latanoprost (XALATAN) 0.005 % ophthalmic solution Place 1 drop into both eyes at bedtime.   Yes [provider]  LINZESS 290 MCG CAPS capsule TAKE 1 CAPSULE(290 MCG) BY [place] BREAKFAST Patient taking differently: Take 290 mcg by mouth daily before breakfast. May take 1 additional capsule as needed for constipation 02/04/21  Yes Mahala Menghini, PA-C  lisinopril-hydrochlorothiazide (ZESTORETIC) 10-12.5 MG tablet Take 1 tablet by mouth daily. 03/11/21  Yes [provider]  magnesium citrate SOLN Take 1 Bottle by mouth as needed for moderate constipation.    Yes [provider]  metFORMIN (GLUCOPHAGE-XR) 500 MG 24 hr tablet Take 500 mg by mouth daily with breakfast.    Yes [provider]  metoprolol tartrate (LOPRESSOR) 50 MG tablet TAKE 1 TABLET(50 MG) BY MOUTH TWICE DAILY 02/04/22  Yes Belva Crome, MD  MOUNJARO 2.5 MG/0.5ML Pen Inject 2.5 mg into the skin once a week.   Yes [provider]  Multiple Vitamin (MULTIVITAMIN WITH MINERALS) TABS tablet Take 1 tablet by mouth daily.   Yes [provider]  NARCAN 4 MG/0.1ML LIQD nasal spray kit Place 1 spray into the  nose as needed (opioid overdose).  06/15/17  Yes [provider]  nitroGLYCERIN (NITROSTAT) 0.4 MG SL tablet Place 1 tablet (0.4 mg total) under the tongue every 5 (five) minutes as needed for chest pain. 08/26/21  Yes Johnson, Clanford L, MD  oxyCODONE-acetaminophen (PERCOCET) 10-325 MG tablet Take 1 tablet by mouth every 4 (four) hours as needed for pain.   Yes [provider]  potassium chloride SA (KLOR-CON) 20 MEQ tablet Take 20 mEq by mouth 2 (two) times daily.   Yes [provider]  sertraline (ZOLOFT) 100 MG tablet Take 100 mg by mouth daily.   Yes [provider]  sildenafil (VIAGRA) 100 MG tablet Take by mouth as needed for erectile dysfunction. 03/11/22  Yes [provider]  sucralfate (CARAFATE) 1 GM/10ML suspension in the morning, at noon, in the evening, and at bedtime. 09/10/20  Yes [provider]  tamsulosin (FLOMAX) 0.4 MG CAPS capsule Take 1 capsule (0.4 mg total) by mouth daily after breakfast. 01/07/16  Yes Kerrie Buffalo, NP  TRULANCE 3 MG TABS Take 1 tablet by mouth daily.   Yes [provider]  methocarbamol (ROBAXIN) 500 MG tablet Take 1 tablet (500 mg total) by mouth every 6 (six) hours as needed. Patient not taking: Reported on 05/18/2022 11/24/21   Sherrell Puller, PA-C  predniSONE (DELTASONE) 10 MG tablet Take 40 mg by mouth daily. Patient not taking: Reported on 05/18/2022 04/24/21   [provider]  Vitamin D, Ergocalciferol, (DRISDOL) 1.25 MG (50000 UT) CAPS capsule Take 50,000 Units by mouth every 7 (seven) days. Monday    [provider]    Inpatient Medications: Scheduled Meds:  ALPRAZolam  1 mg Oral TID   atorvastatin  80 mg Oral Daily   clopidogrel  75 mg Oral Daily   ezetimibe  10 mg Oral Daily   famotidine  20 mg Oral QHS   furosemide  40 mg Oral Daily   heparin  5,000 Units Subcutaneous Q8H   insulin aspart  0-15 Units Subcutaneous TID WC   insulin aspart  0-5 Units Subcutaneous QHS    isosorbide mononitrate  90 mg Oral Daily  lisinopril  10 mg Oral Daily   metoprolol tartrate  50 mg Oral BID   pantoprazole  40 mg Oral Daily   sertraline  100 mg Oral Daily   tamsulosin  0.4 mg Oral QPC breakfast    PRN Meds: acetaminophen **OR** acetaminophen, ipratropium, ondansetron **OR** ondansetron (ZOFRAN) IV, oxyCODONE  Allergies:    Allergies  Allergen Reactions   Gabapentin Hives   Ibuprofen Other (See Comments)    Stomach  Upset and stomach ulcers   Zolpidem Tartrate     Other Reaction(s): Not available   Naproxen Other (See Comments)    HALLUCINATIONS    Social History:   Social History   Tobacco Use   Smoking status: Never   Smokeless tobacco: Never  Substance Use Topics   Alcohol use: No    Alcohol/week: 0.0 standard drinks of alcohol     Family History:    Family History  Problem Relation Age of Onset   Diabetes Mother    Hypertension Mother    Heart attack Mother 82   Hypertension Father    Diabetes Father    Heart attack Father 5   Heart attack Other        mother, father, brother, sister all deceased due to MI   Heart attack Sister    Heart attack Brother    Seizures Brother    Heart failure Other    Colon cancer Neg Hx    Liver disease Neg Hx    Anesthesia problems Neg Hx    Hypotension Neg Hx    Malignant hyperthermia Neg Hx    Pseudochol deficiency Neg Hx    Colon polyps Neg Hx      ROS:  Please see the history of present illness.  All other ROS reviewed and negative.     Physical Exam/Data:   Vitals:   05/18/22 1943 05/18/22 1953 05/18/22 2053 05/19/22 0500  BP:  (!) 138/93 137/84 130/82  Pulse:  71 80 74  Resp:  20 19 18   Temp:  (!) 97.5 F (36.4 C) (!) 97.4 F (36.3 C) 98.4 F (36.9 C)  TempSrc:  Oral Oral Oral  SpO2:  100% 96% 98%  Weight: 120.7 kg     Height: 5\' 10"  (1.778 m)      No intake or output data in the 24 hours ending 05/19/22 0851    05/18/2022    7:43 PM 05/18/2022    2:17 PM 05/11/2022     8:00 AM  Last 3 Weights  Weight (lbs) 266 lb 1.6 oz 276 lb 278 lb 12.8 oz  Weight (kg) 120.702 kg 125.193 kg 126.463 kg     Body mass index is 38.18 kg/m.  General:  Well nourished, well developed male appearing in no acute distress HEENT: normal Neck: no JVD Vascular: No carotid bruits; Distal pulses 2+ bilaterally Cardiac:  normal S1, S2; RRR; no murmur  Lungs:  clear to auscultation bilaterally, no wheezing, rhonchi or rales  Abd: soft, nontender, no hepatomegaly  Ext: trace lower extremity edema Musculoskeletal:  No deformities, BUE and BLE strength normal and equal Skin: warm and dry  Neuro:  CNs 2-12 intact, no focal abnormalities noted Psych:  Normal affect   EKG:  The EKG was personally reviewed and demonstrates: Sinus tachycardia, heart rate 105 with PAC's and TWI along the inferior and lateral leads which is similar to prior tracings.  Possible short burst of SVT.  Telemetry:  Telemetry was personally reviewed and demonstrates: Sinus rhythm, heart rate  80s to 90s with occasional PVC's and PAC's. Possible brief bursts of SVT.  Relevant CV Studies:  Coronary CT: 09/2019 IMPRESSION: 1. Coronary calcium score of 0. This was 0 percentile for age and sex matched control.   2. Normal coronary origin with right dominance.   3. No evidence of CAD.  Event Monitor: 05/2021 Normal sinus rhythm Frequent, high burden (25%), symptomatic non-sustained SVT Longest SVT run lasted 25 seconds at 111 bpm. Fastest SVT > 200 bpm.  NST: 03/2022   The study is normal. The study is low risk.   No ST deviation was noted.   LV perfusion is normal. There is no evidence of ischemia. There is no evidence of infarction.   Left ventricular function is normal. Nuclear stress EF: 47 %. The left ventricular ejection fraction is mildly decreased (45-54%). End diastolic cavity size is normal. End systolic cavity size is normal.   Prior study available for comparison from 05/25/2021.  Laboratory  Data:  High Sensitivity Troponin:   Recent Labs  Lab 05/18/22 1441 05/18/22 1552 05/19/22 0426  TROPONINIHS 2 <2 3     Chemistry Recent Labs  Lab 05/18/22 1441 05/19/22 0426  NA 136 136  K 3.7 3.5  CL 100 104  CO2 26 26  GLUCOSE 104* 84  BUN 15 11  CREATININE 1.03 0.86  CALCIUM 9.3 8.7*  MG  --  2.1  GFRNONAA >60 >60  ANIONGAP 10 6    Recent Labs  Lab 05/19/22 0426  PROT 6.7  ALBUMIN 3.7  AST 16  ALT 13  ALKPHOS 68  BILITOT 1.0   Hematology Recent Labs  Lab 05/18/22 1441 05/19/22 0426  WBC 4.8 3.2*  RBC 4.13* 3.79*  HGB 11.9* 11.0*  HCT 35.8* 32.4*  MCV 86.7 85.5  MCH 28.8 29.0  MCHC 33.2 34.0  RDW 13.8 13.7  PLT 262 221   DDimer  Recent Labs  Lab 05/18/22 1552  DDIMER 0.47     Radiology/Studies:  CT Head Wo Contrast  Result Date: 05/18/2022 CLINICAL DATA:  Neuro deficit, acute, stroke suspected EXAM: CT HEAD WITHOUT CONTRAST TECHNIQUE: Contiguous axial images were obtained from the base of the skull through the vertex without intravenous contrast. RADIATION DOSE REDUCTION: This exam was performed according to the departmental dose-optimization program which includes automated exposure control, adjustment of the mA and/or kV according to patient size and/or use of iterative reconstruction technique. COMPARISON:  CT head December 10, 2015. FINDINGS: Brain: No evidence of acute infarction, hemorrhage, hydrocephalus, extra-axial collection or mass lesion/mass effect. Vascular: No hyperdense vessel identified. Skull: No acute fracture. Sinuses/Orbits: Clear sinuses.  No acute orbital findings. Other: No mastoid effusions. IMPRESSION: No evidence of acute intracranial abnormality. Electronically Signed   By: Margaretha Sheffield M.D.   On: 05/18/2022 17:05   DG Chest 2 View  Result Date: 05/18/2022 CLINICAL DATA:  Chest pain and shortness of breath EXAM: CHEST - 2 VIEW COMPARISON:  Chest x-ray dated January 01, 2022 FINDINGS: The heart size and mediastinal  contours are within normal limits. Prior median sternotomy. Vascular stent overlying the left upper lobe. Both lungs are clear. The visualized skeletal structures are unremarkable. IMPRESSION: No active cardiopulmonary disease. Electronically Signed   By: Yetta Glassman M.D.   On: 05/18/2022 14:44     Assessment and Plan:   1. Chest Pain with Mixed Features - The patient has a longstanding history of chest pain (previously underwent CABG in 2002 but had an atretic LIMA by follow-up catheterization in 2013  and Coronary CT in 2021 showed a calcium score of 0).  Recent NST in 03/2022 was low-risk as outlined above. - Given his extensive workup in the past and cardiac enzymes being negative this admission, would not anticipate further cardiac testing besides obtaining an updated echocardiogram given that his EF was reduced by recent NST. No plans for repeat stress testing this admission. - Continue Atorvastatin 80 mg daily, Zetia 10 mg daily, Imdur 90 mg daily and Lopressor 50 mg twice daily. No indication for Plavix from a cardiology perspective but possibly on this due to PAD and prior subclavian stent placement. Also, he is listed as taking Sildenafil so if taking this routinely, would recommend stopping Imdur.   2.  Syncope - Unclear etiology as his episodes occurred at rest and he did not have any prodromal symptoms.  Telemetry shows possible very brief episodes of SVT but no significant arrhythmias to account for syncope thus far.  - Given that his prior monitor in 05/2021 showed a very high burden (25%) of symptomatic nonsustained SVT, would consider a follow-up outpatient Zio patch to reassess his burden. Continue Lopressor 50 mg twice daily for now but this can be further titrated if needed. Echocardiogram pending. He denies any substance use. UDS pending.  - As mentioned above, would stop Imdur if he is taking Sildenafil.   3. HFpEF - EF was at 60-65% by echo in 10/2021. Repeat echocardiogram  is pending given EF was reduced by NST but he does not appear volume overloaded by examination today. He is on Lasix 40 mg daily and HCTZ 12.5 mg daily as an outpatient. Would recommend stopping HCTZ to avoid dual diuretic therapy.    4. HLD - LDL was at 17 in 02/2022. On Repatha as an outpatient. Continue Zetia and Atorvastatin.   5. HTN - BP was well-controlled at 130/82 on most recent check. He has been continued on Imdur, Lisinopril-HCTZ and Lopressor. As discussed above, would recommend stopping HCTZ given the concurrent use of Lasix.     For questions or updates, please contact [place] Please consult [email or web address] for contact info under    Signed, Erma Heritage, PA-C  05/19/2022 8:51 AM   Attending note:  Patient seen and examined.  I reviewed extensive records and discussed the case with Ms. Delano Metz, agree with her above findings.  Dalton [name] is currently admitted for evaluation of chest discomfort and recent episodes of syncope without prodromal symptoms.  He was just evaluated in our cardiology clinic for preoperative cardiac clearance and underwent a Myoview in February that demonstrated no frank ischemia and was overall low risk although LVEF calculated at 47%.  He states that he has intermittent palpitations that have been chronic, not definitely associated with syncope however.  Also fatigue after he takes his medications in the morning.  Does have prior history of substance abuse including cocaine, but denies recent use.  On examination this morning he is in no distress.  Afebrile, heart rate in the 70s to 80s in sinus rhythm with frequent PACs by telemetry.  Systolics in the [id number].  Lungs are clear.  Cardiac exam with RRR and occasional ectopy, no gallop.  No peripheral edema.  Pertinent lab work includes potassium 3.5, BUN 11, creatinine 0.86, normal LFTs, normal high-sensitivity troponin I levels, hemoglobin 11.0, platelets 221, D-dimer 0.47.  ECG  from March 26 shows sinus rhythm with frequent PACs, nonspecific ST changes.  Chest x-ray reports no acute findings.  Head CT reports no  acute intracranial abnormalities.  Patient presents reporting somewhat atypical chest discomfort and reported syncope, etiology not certain.  He does have frequent PACs and bursts of SVT which look to be old in comparison to prior cardiac monitoring and not necessarily associated with the above symptoms.  He is already on beta-blocker.  Would recommend follow-up echocardiogram to ensure no decrease in LVEF as suggested by recent Myoview, although not clear that any further ischemic workup is necessary at this point.  Recommend stopping Imdur since he does use sildenafil, also has no ischemia by recent Myoview and calcium score of 0 (previous CABG with LIMA to LAD, subsequently found to have atretic graft and relatively normal coronaries - previous cardiac event felt to be secondary to cocaine).  Will check UDS as well to be complete.  Continue Lipitor, Zetia, Lasix, and Lopressor.  Also stopping HCTZ.  Satira Sark, M.D., F.A.C.C.

## 2022-05-19 NOTE — Progress Notes (Signed)
  Echocardiogram 2D Echocardiogram has been performed.  Dalton [name] 05/19/2022, 9:24 AM

## 2022-05-20 DIAGNOSIS — E785 Hyperlipidemia, unspecified: Secondary | ICD-10-CM | POA: Diagnosis not present

## 2022-05-20 DIAGNOSIS — R0789 Other chest pain: Secondary | ICD-10-CM | POA: Diagnosis not present

## 2022-05-20 DIAGNOSIS — E119 Type 2 diabetes mellitus without complications: Secondary | ICD-10-CM | POA: Diagnosis not present

## 2022-05-20 DIAGNOSIS — R079 Chest pain, unspecified: Secondary | ICD-10-CM | POA: Diagnosis not present

## 2022-05-20 DIAGNOSIS — K219 Gastro-esophageal reflux disease without esophagitis: Secondary | ICD-10-CM | POA: Diagnosis not present

## 2022-05-20 LAB — HEMOGLOBIN A1C
Hgb A1c MFr Bld: 5.9 % — ABNORMAL HIGH (ref 4.8–5.6)
Mean Plasma Glucose: 123 mg/dL

## 2022-05-20 LAB — LIPID PANEL
Cholesterol: 70 mg/dL (ref 0–200)
HDL: 37 mg/dL — ABNORMAL LOW (ref >40)
LDL Cholesterol: 17 mg/dL (ref 0–99)
Total CHOL/HDL Ratio: 1.9 RATIO
Triglycerides: 82 mg/dL (ref <150)
VLDL: 16 mg/dL (ref 0–40)

## 2022-05-20 LAB — GLUCOSE, CAPILLARY: Glucose-Capillary: 121 mg/dL — ABNORMAL HIGH (ref 70–99)

## 2022-05-20 LAB — TSH: TSH: 1.591 u[IU]/mL (ref 0.350–4.500)

## 2022-05-20 MED ORDER — LISINOPRIL 10 MG PO TABS: 10.0000 mg | ORAL_TABLET | Freq: Every day | ORAL | 1 refills | Status: DC

## 2022-05-20 NOTE — Discharge Summary (Addendum)
Physician Discharge Summary  Dalton [name] [id number] DOB: 1960-07-20 DOA: 05/18/2022  PCP: Nolene Ebbs, MD Cardiology: [facility]  Admit date: 05/18/2022 Discharge date: 05/20/2022  Admitted From:  Home  Disposition:  Home   Recommendations for Outpatient Follow-up:  Follow up with PCP in 1 weeks Follow up with cardiology office as scheduled 06/15/22   Discharge Condition: STABLE   CODE STATUS: FULL DIET: 2 gm sodium restricted    Brief Hospitalization Summary: Please see all hospital notes, images, labs for full details of the hospitalization. ADMISSION HPI:  62 y.o. male with medical history significant of heart failure with preserved ejection fraction, anxiety, depression, COPD, history of DVT, GERD, hyperlipidemia, hypertension, CABG x 1 in 2000, type 2 diabetes, and more presents the ED with a chief complaint of syncopal episodes.  Patient reports that all of his cardiology team is in [place].  He saw them recently, and they were scheduling him for an echo.  He reports he was seeing them because his blood pressure has been going up and down, and that his heart has been slowing down per his report.  According to the cardiology note he was presenting for 1 month follow-up after complaining of ongoing chest pain.  During that appointment they discussed a Myoview that showed decrease in LV function, so they recommended an echo.  Patient presents today because he had several syncopal events, insomnia, and decreased appetite.  He reports he has still been compliant with all of his medications despite the symptoms.  Patient reports that he had a syncopal episode about 4 days ago, 2 syncopal episodes 2 days ago, and 1 today.  He reports that 1 today happen when he was at rest sitting down, started to have chest pain and shortness of breath, so he stood up to try to get his circulation going and was planning to walk around.  Instead he woke up on the floor.  He reports it took 10 to 15  minutes for him to regain his strength to stand back up.  Son at bedside reports that he was out for approximately 30 seconds.  Patient reports that sometimes he does feel dizzy before this happens, but he did not today.  He reports that he did have a chest pain and dyspnea.  He describes the chest pain as being in the center of his chest and feeling like a heart attack.  He describes it as sharp pain.  He feels clogged so that he cannot breathe.  Other episodes that he had previously were falling out of bed or falling when he tried to get up, all with loss of consciousness per his report.  The only other abnormal thing for him lately is URI symptoms.  He has had a cough, subjective fever, and mucus per his report.  He is taking TheraFlu and it has helped.  He denies dysuria and diarrhea.  He does report foul-smelling urine.  He denies any hematuria, hematochezia, melena.  He complains of his decreased appetite, but he is also on Mounjaro.  Patient has no other complaints at this time.   HOSPITAL COURSE BY PROBLEM   * Chest pain - With associated dyspnea and syncope - Recently saw cardiology on March 19 where they discussed a Myoview showing no evidence of decreased perfusion but did show reduced LV function, they recommended follow-up with echocardiogram - Will follow up TTE results- LVEF 50-55%. No RWMAs. Grade 1 DD.  - Will continue Plavix, Lipitor, Zetia, Lopressor - Resume Repatha  at discharge - EKG on admission showed a heart rate of 105, sinus arrhythmia, QTc 454 with some T wave inversions - Troponin undetectable - Cardiology was consulted  - Appreciate recs from cardiology - ok to discharge home -- outpatient follow up with cardiology arranged on 06/15/22.  - D-dimer was 0.47 - Will monitor on telemetry -- PT evaluation requested   Syncope - Follow up TTE and get a PT evaluation . - Carotid ultrasound neg for hemodynamically significant stenosis  - Orthostatics are not indicative of  orthostatic hypotension etiology - Consult cardiology appreciated  - Monitored on telemetry.  Cardiology team DC'd imdur and HCTZ  Hyperlipidemia LDL goal <70 - atorvastatin, Zetia, resume Repatha at discharge  MDD (major depressive disorder), recurrent episode, moderate ([place]) - Continue Zoloft  Type 2 diabetes mellitus with vascular disease ([place]) - CBGs with sliding scale coverage CBG (last 3)  Recent Labs    05/19/22 1610 05/19/22 1949 05/20/22 0815  GLUCAP 102* 134* 121*     Essential hypertension - Continue lisinopril, metoprolol, cardiology team discontinued Imdur, HCTZ   Gastroesophageal reflux disease without esophagitis - Continue PPI and Pepcid   Discharge Diagnoses:  Principal Problem:   Chest pain Active Problems:   Gastroesophageal reflux disease without esophagitis   Essential hypertension   Type 2 diabetes mellitus with vascular disease (H[facility])   MDD (major depressive disorder), recurrent episode, moderate ([place])   Hyperlipidemia LDL goal <70   Syncope   Discharge Instructions:  Allergies as of 05/20/2022       Reactions   Gabapentin Hives   Ibuprofen Other (See Comments)   Stomach  Upset and stomach ulcers   Zolpidem Tartrate    Other Reaction(s): Not available   Naproxen Other (See Comments)   HALLUCINATIONS        Medication List     STOP taking these medications    isosorbide mononitrate 60 MG 24 hr tablet Commonly known as: IMDUR   lisinopril-hydrochlorothiazide 10-12.5 MG tablet Commonly known as: ZESTORETIC   methocarbamol 500 MG tablet Commonly known as: ROBAXIN   predniSONE 10 MG tablet Commonly known as: DELTASONE       TAKE these medications    albuterol 108 (90 Base) MCG/ACT inhaler Commonly known as: VENTOLIN HFA Inhale 2 puffs into the lungs every 6 (six) hours as needed for wheezing or shortness of breath.   albuterol (2.5 MG/3ML) 0.083% nebulizer solution Commonly known as: PROVENTIL Take 2.5 mg by  nebulization every 6 (six) hours as needed for wheezing or shortness of breath.   ALPRAZolam 1 MG tablet Commonly known as: XANAX Take 1 mg by mouth 4 (four) times daily.   Arnuity Ellipta 200 MCG/ACT Aepb Generic drug: Fluticasone Furoate Inhale 1 puff into the lungs daily as needed (shortness of breath).   atorvastatin 80 MG tablet Commonly known as: LIPITOR Take 80 mg by mouth at bedtime.   azelastine 0.1 % nasal spray Commonly known as: ASTELIN Place 1 spray into both nostrils 2 (two) times daily.   beclomethasone 80 MCG/ACT inhaler Commonly known as: QVAR Inhale 2 puffs into the lungs 2 (two) times daily as needed (respiratory issues.).   Belsomra 15 MG Tabs Generic drug: Suvorexant Take 1 tablet by mouth at bedtime.   benzonatate 100 MG capsule Commonly known as: TESSALON Take 1 capsule (100 mg total) by mouth 3 (three) times daily as needed for cough.   brimonidine 0.1 % Soln Commonly known as: ALPHAGAN P Place 1 drop into the right eye 2 (  two) times daily.   cetirizine 10 MG tablet Commonly known as: ZYRTEC Take 10 mg by mouth daily as needed for allergies.   clopidogrel 75 MG tablet Commonly known as: PLAVIX Take 75 mg by mouth daily.   dexlansoprazole 60 MG capsule Commonly known as: Dexilant Take 1 capsule (60 mg total) by mouth daily.   ezetimibe 10 MG tablet Commonly known as: ZETIA Take 10 mg by mouth daily.   famotidine 20 MG tablet Commonly known as: PEPCID Take 20 mg by mouth at bedtime.   fluticasone 50 MCG/ACT nasal spray Commonly known as: FLONASE Place 2 sprays into both nostrils daily.   furosemide 40 MG tablet Commonly known as: LASIX Take 1 tablet (40 mg total) by mouth daily. Can take an additional 40mg  tab as needed for swelling or weight gain   glipiZIDE 10 MG tablet Commonly known as: GLUCOTROL Take 10 mg by mouth daily before breakfast.   ipratropium 0.02 % nebulizer solution Commonly known as: ATROVENT Take 0.5 mg by  nebulization 2 (two) times daily as needed for wheezing or shortness of breath (for congestion).   ketoconazole 2 % shampoo Commonly known as: NIZORAL Apply 1 application topically daily as needed for irritation.   latanoprost 0.005 % ophthalmic solution Commonly known as: XALATAN Place 1 drop into both eyes at bedtime.   Linzess 290 MCG Caps capsule Generic drug: linaclotide TAKE 1 CAPSULE(290 MCG) BY MOUTH DAILY BEFORE BREAKFAST What changed: See the new instructions.   lisinopril 10 MG tablet Commonly known as: ZESTRIL Take 1 tablet (10 mg total) by mouth daily. Start taking on: May 21, 2022   magnesium citrate Soln Take 1 Bottle by mouth as needed for moderate constipation.   metFORMIN 500 MG 24 hr tablet Commonly known as: GLUCOPHAGE-XR Take 500 mg by mouth daily with breakfast.   metoprolol tartrate 50 MG tablet Commonly known as: LOPRESSOR TAKE 1 TABLET(50 MG) BY MOUTH TWICE DAILY   Mounjaro 2.5 MG/0.5ML Pen Generic drug: tirzepatide Inject 2.5 mg into the skin once a week.   multivitamin with minerals Tabs tablet Take 1 tablet by mouth daily.   Narcan 4 MG/0.1ML Liqd nasal spray kit Generic drug: naloxone Place 1 spray into the nose as needed (opioid overdose).   nitroGLYCERIN 0.4 MG SL tablet Commonly known as: NITROSTAT Place 1 tablet (0.4 mg total) under the tongue every 5 (five) minutes as needed for chest pain.   oxyCODONE-acetaminophen 10-325 MG tablet Commonly known as: PERCOCET Take 1 tablet by mouth every 4 (four) hours as needed for pain.   potassium chloride SA 20 MEQ tablet Commonly known as: KLOR-CON M Take 20 mEq by mouth 2 (two) times daily.   Repatha SureClick [id number] MG/ML Soaj Generic drug: Evolocumab Take 140 mg by mouth every 14 (fourteen) days.   sertraline 100 MG tablet Commonly known as: ZOLOFT Take 100 mg by mouth daily.   sildenafil 100 MG tablet Commonly known as: VIAGRA Take by mouth as needed for erectile dysfunction.    sucralfate 1 GM/10ML suspension Commonly known as: CARAFATE in the morning, at noon, in the evening, and at bedtime.   tamsulosin 0.4 MG Caps capsule Commonly known as: FLOMAX Take 1 capsule (0.4 mg total) by mouth daily after breakfast.   Trulance 3 MG Tabs Generic drug: Plecanatide Take 1 tablet by mouth daily.   Vitamin D (Ergocalciferol) 1.25 MG (50000 UNIT) Caps capsule Commonly known as: DRISDOL Take 50,000 Units by mouth every 7 (seven) days. Monday  Follow-up Information     Marylu Lund., NP Follow up on 06/15/2022.   Specialty: Cardiology Why: Keep scheduled Cardiology Follow-up on 06/15/2022 at 8:25 AM. Contact information: [address] [place] [address] [phone]         Nolene Ebbs, MD. Schedule an appointment as soon as possible for a visit in 1 week(s).   Specialty: Internal Medicine Why: Hospital Follow Up Contact information: [place] [address] [phone]                Allergies  Allergen Reactions   Gabapentin Hives   Ibuprofen Other (See Comments)    Stomach  Upset and stomach ulcers   Zolpidem Tartrate     Other Reaction(s): Not available   Naproxen Other (See Comments)    HALLUCINATIONS   Allergies as of 05/20/2022       Reactions   Gabapentin Hives   Ibuprofen Other (See Comments)   Stomach  Upset and stomach ulcers   Zolpidem Tartrate    Other Reaction(s): Not available   Naproxen Other (See Comments)   HALLUCINATIONS        Medication List     STOP taking these medications    isosorbide mononitrate 60 MG 24 hr tablet Commonly known as: IMDUR   lisinopril-hydrochlorothiazide 10-12.5 MG tablet Commonly known as: ZESTORETIC   methocarbamol 500 MG tablet Commonly known as: ROBAXIN   predniSONE 10 MG tablet Commonly known as: DELTASONE       TAKE these medications    albuterol 108 (90 Base) MCG/ACT inhaler Commonly known as: VENTOLIN  HFA Inhale 2 puffs into the lungs every 6 (six) hours as needed for wheezing or shortness of breath.   albuterol (2.5 MG/3ML) 0.083% nebulizer solution Commonly known as: PROVENTIL Take 2.5 mg by nebulization every 6 (six) hours as needed for wheezing or shortness of breath.   ALPRAZolam 1 MG tablet Commonly known as: XANAX Take 1 mg by mouth 4 (four) times daily.   Arnuity Ellipta 200 MCG/ACT Aepb Generic drug: Fluticasone Furoate Inhale 1 puff into the lungs daily as needed (shortness of breath).   atorvastatin 80 MG tablet Commonly known as: LIPITOR Take 80 mg by mouth at bedtime.   azelastine 0.1 % nasal spray Commonly known as: ASTELIN Place 1 spray into both nostrils 2 (two) times daily.   beclomethasone 80 MCG/ACT inhaler Commonly known as: QVAR Inhale 2 puffs into the lungs 2 (two) times daily as needed (respiratory issues.).   Belsomra 15 MG Tabs Generic drug: Suvorexant Take 1 tablet by mouth at bedtime.   benzonatate 100 MG capsule Commonly known as: TESSALON Take 1 capsule (100 mg total) by mouth 3 (three) times daily as needed for cough.   brimonidine 0.1 % Soln Commonly known as: ALPHAGAN P Place 1 drop into the right eye 2 (two) times daily.   cetirizine 10 MG tablet Commonly known as: ZYRTEC Take 10 mg by mouth daily as needed for allergies.   clopidogrel 75 MG tablet Commonly known as: PLAVIX Take 75 mg by mouth daily.   dexlansoprazole 60 MG capsule Commonly known as: Dexilant Take 1 capsule (60 mg total) by mouth daily.   ezetimibe 10 MG tablet Commonly known as: ZETIA Take 10 mg by mouth daily.   famotidine 20 MG tablet Commonly known as: PEPCID Take 20 mg by mouth at bedtime.   fluticasone 50 MCG/ACT nasal spray Commonly known as: FLONASE Place 2 sprays into both nostrils daily.  furosemide 40 MG tablet Commonly known as: LASIX Take 1 tablet (40 mg total) by mouth daily. Can take an additional 40mg  tab as needed for swelling or  weight gain   glipiZIDE 10 MG tablet Commonly known as: GLUCOTROL Take 10 mg by mouth daily before breakfast.   ipratropium 0.02 % nebulizer solution Commonly known as: ATROVENT Take 0.5 mg by nebulization 2 (two) times daily as needed for wheezing or shortness of breath (for congestion).   ketoconazole 2 % shampoo Commonly known as: NIZORAL Apply 1 application topically daily as needed for irritation.   latanoprost 0.005 % ophthalmic solution Commonly known as: XALATAN Place 1 drop into both eyes at bedtime.   Linzess 290 MCG Caps capsule Generic drug: linaclotide TAKE 1 CAPSULE(290 MCG) BY MOUTH DAILY BEFORE BREAKFAST What changed: See the new instructions.   lisinopril 10 MG tablet Commonly known as: ZESTRIL Take 1 tablet (10 mg total) by mouth daily. Start taking on: May 21, 2022   magnesium citrate Soln Take 1 Bottle by mouth as needed for moderate constipation.   metFORMIN 500 MG 24 hr tablet Commonly known as: GLUCOPHAGE-XR Take 500 mg by mouth daily with breakfast.   metoprolol tartrate 50 MG tablet Commonly known as: LOPRESSOR TAKE 1 TABLET(50 MG) BY MOUTH TWICE DAILY   Mounjaro 2.5 MG/0.5ML Pen Generic drug: tirzepatide Inject 2.5 mg into the skin once a week.   multivitamin with minerals Tabs tablet Take 1 tablet by mouth daily.   Narcan 4 MG/0.1ML Liqd nasal spray kit Generic drug: naloxone Place 1 spray into the nose as needed (opioid overdose).   nitroGLYCERIN 0.4 MG SL tablet Commonly known as: NITROSTAT Place 1 tablet (0.4 mg total) under the tongue every 5 (five) minutes as needed for chest pain.   oxyCODONE-acetaminophen 10-325 MG tablet Commonly known as: PERCOCET Take 1 tablet by mouth every 4 (four) hours as needed for pain.   potassium chloride SA 20 MEQ tablet Commonly known as: KLOR-CON M Take 20 mEq by mouth 2 (two) times daily.   Repatha SureClick [id number] MG/ML Soaj Generic drug: Evolocumab Take 140 mg by mouth every 14  (fourteen) days.   sertraline 100 MG tablet Commonly known as: ZOLOFT Take 100 mg by mouth daily.   sildenafil 100 MG tablet Commonly known as: VIAGRA Take by mouth as needed for erectile dysfunction.   sucralfate 1 GM/10ML suspension Commonly known as: CARAFATE in the morning, at noon, in the evening, and at bedtime.   tamsulosin 0.4 MG Caps capsule Commonly known as: FLOMAX Take 1 capsule (0.4 mg total) by mouth daily after breakfast.   Trulance 3 MG Tabs Generic drug: Plecanatide Take 1 tablet by mouth daily.   Vitamin D (Ergocalciferol) 1.25 MG (50000 UNIT) Caps capsule Commonly known as: DRISDOL Take 50,000 Units by mouth every 7 (seven) days. Monday        Procedures/Studies: ECHOCARDIOGRAM COMPLETE  Result Date: 05/19/2022    ECHOCARDIOGRAM REPORT   Patient Name:   Dalton [name] Date of Exam: 05/19/2022 Medical Rec #:  [id number]         Height:       70.0 in Accession #:    [id number]        Weight:       266.1 lb Date of Birth:  01/18/1961         BSA:          2.357 m Patient Age:    62 years  BP:           130/82 mmHg Patient Gender: M                 HR:           86 bpm. Exam Location:  [facility] Procedure: 2D Echo, Cardiac Doppler, Color Doppler and Intracardiac            Opacification Agent Indications:    Syncope R55  History:        Patient has prior history of Echocardiogram examinations, most                 recent 11/10/2021. CHF, CAD and Previous Myocardial Infarction,                 Signs/Symptoms:Chest Pain and Syncope; Risk                 Factors:Hypertension, Diabetes, Dyslipidemia and Non-Smoker.  Sonographer:    Greer Pickerel Referring Phys: [id number] ASIA B Winthrop  Sonographer Comments: Image acquisition challenging due to patient body habitus, Image acquisition challenging due to COPD and Image acquisition challenging due to respiratory motion. IMPRESSIONS  1. Left ventricular ejection fraction, by estimation, is 50 to 55%. The left  ventricle has low normal function. The left ventricle has no regional wall motion abnormalities. Left ventricular diastolic parameters are consistent with Grade I diastolic dysfunction (impaired relaxation).  2. Right ventricular systolic function is low normal. The right ventricular size is normal. There is normal pulmonary artery systolic pressure. The estimated right ventricular systolic pressure is 99991111 mmHg.  3. The mitral valve is grossly normal. Trivial mitral valve regurgitation.  4. The aortic valve is tricuspid. Aortic valve regurgitation is not visualized.  5. The inferior vena cava is normal in size with greater than 50% respiratory variability, suggesting right atrial pressure of 3 mmHg. Comparison(s): Prior images reviewed side by side. LVEF low normal range at 50-55%. FINDINGS  Left Ventricle: Left ventricular ejection fraction, by estimation, is 50 to 55%. The left ventricle has low normal function. The left ventricle has no regional wall motion abnormalities. Definity contrast agent was given IV to delineate the left ventricular endocardial borders. The left ventricular internal cavity size was normal in size. There is borderline left ventricular hypertrophy. Left ventricular diastolic parameters are consistent with Grade I diastolic dysfunction (impaired relaxation). Right Ventricle: The right ventricular size is normal. No increase in right ventricular wall thickness. Right ventricular systolic function is low normal. There is normal pulmonary artery systolic pressure. The tricuspid regurgitant velocity is 1.63 m/s,  and with an assumed right atrial pressure of 3 mmHg, the estimated right ventricular systolic pressure is 99991111 mmHg. Left Atrium: Left atrial size was normal in size. Right Atrium: Right atrial size was normal in size. Pericardium: There is no evidence of pericardial effusion. Mitral Valve: The mitral valve is grossly normal. Trivial mitral valve regurgitation. Tricuspid Valve: The  tricuspid valve is grossly normal. Tricuspid valve regurgitation is trivial. Aortic Valve: The aortic valve is tricuspid. There is mild aortic valve annular calcification. Aortic valve regurgitation is not visualized. Pulmonic Valve: The pulmonic valve was grossly normal. Pulmonic valve regurgitation is trivial. Aorta: The aortic root is normal in size and structure. Venous: The inferior vena cava is normal in size with greater than 50% respiratory variability, suggesting right atrial pressure of 3 mmHg. IAS/Shunts: No atrial level shunt detected by color flow Doppler.  LEFT VENTRICLE PLAX 2D LVIDd:  4.50 cm   Diastology LVIDs:         3.30 cm   LV e' medial:    7.40 cm/s LV PW:         1.00 cm   LV E/e' medial:  9.2 LV IVS:        0.90 cm   LV e' lateral:   7.72 cm/s LVOT diam:     2.30 cm   LV E/e' lateral: 8.8 LV SV:         72 LV SV Index:   30 LVOT Area:     4.15 cm  RIGHT VENTRICLE RV S prime:     7.04 cm/s TAPSE (M-mode): 1.2 cm LEFT ATRIUM             Index        RIGHT ATRIUM           Index LA diam:        4.20 cm 1.78 cm/m   RA Area:     15.60 cm LA Vol (A2C):   54.3 ml 23.04 ml/m  RA Volume:   35.30 ml  14.98 ml/m LA Vol (A4C):   52.4 ml 22.23 ml/m LA Biplane Vol: 54.2 ml 23.00 ml/m  AORTIC VALVE             PULMONIC VALVE LVOT Vmax:   94.30 cm/s  PR End Diast Vel: 9.61 msec LVOT Vmean:  60.800 cm/s LVOT VTI:    0.173 m  AORTA Ao Root diam: 3.50 cm Ao Asc diam:  2.70 cm MITRAL VALVE               TRICUSPID VALVE MV Area (PHT): 4.36 cm    TR Peak grad:   10.6 mmHg MV Decel Time: 174 msec    TR Vmax:        163.00 cm/s MV E velocity: 68.10 cm/s MV A velocity: 78.80 cm/s  SHUNTS MV E/A ratio:  0.86        Systemic VTI:  0.17 m                            Systemic Diam: 2.30 cm Rozann Lesches MD Electronically signed by Rozann Lesches MD Signature Date/Time: 05/19/2022/11:06:49 AM    Final    US Carotid Bilateral  Result Date: 05/19/2022 CLINICAL DATA:  Hypertension, syncope, coronary  artery disease EXAM: BILATERAL CAROTID DUPLEX ULTRASOUND TECHNIQUE: Pearline Cables scale imaging, color Doppler and duplex ultrasound were performed of bilateral carotid and vertebral arteries in the neck. COMPARISON:  None Available. FINDINGS: Criteria: Quantification of carotid stenosis is based on velocity parameters that correlate the residual internal carotid diameter with NASCET-based stenosis levels, using the diameter of the distal internal carotid lumen as the denominator for stenosis measurement. The following velocity measurements were obtained: RIGHT ICA: 76/28 cm/sec CCA: [id number] cm/sec SYSTOLIC ICA/CCA RATIO:  0.5 ECA: 182 cm/sec LEFT ICA: 89/34 cm/sec CCA: [id number] cm/sec SYSTOLIC ICA/CCA RATIO:  0.7 ECA: 137 cm/sec RIGHT CAROTID ARTERY: Mild tortuosity. Smooth intimal thickening in the distal common carotid artery. Minimal plaque in the bulb. No significant stenosis. Normal waveforms and color Doppler signal throughout. RIGHT VERTEBRAL ARTERY:  Normal flow direction and waveform. LEFT CAROTID ARTERY: Minimal smooth plaque in the bulb. Normal waveforms and color Doppler signal throughout. No significant stenosis. LEFT VERTEBRAL ARTERY:  Normal flow direction and waveform. IMPRESSION: 1. Minimal bilateral carotid bifurcation plaque resulting in less than 50% diameter ICA stenosis. 2.  Antegrade bilateral vertebral arterial flow. Electronically Signed   By: Lucrezia Europe M.D.   On: 05/19/2022 10:44   CT Head Wo Contrast  Result Date: 05/18/2022 CLINICAL DATA:  Neuro deficit, acute, stroke suspected EXAM: CT HEAD WITHOUT CONTRAST TECHNIQUE: Contiguous axial images were obtained from the base of the skull through the vertex without intravenous contrast. RADIATION DOSE REDUCTION: This exam was performed according to the departmental dose-optimization program which includes automated exposure control, adjustment of the mA and/or kV according to patient size and/or use of iterative reconstruction technique. COMPARISON:   CT head December 10, 2015. FINDINGS: Brain: No evidence of acute infarction, hemorrhage, hydrocephalus, extra-axial collection or mass lesion/mass effect. Vascular: No hyperdense vessel identified. Skull: No acute fracture. Sinuses/Orbits: Clear sinuses.  No acute orbital findings. Other: No mastoid effusions. IMPRESSION: No evidence of acute intracranial abnormality. Electronically Signed   By: Margaretha Sheffield M.D.   On: 05/18/2022 17:05   DG Chest 2 View  Result Date: 05/18/2022 CLINICAL DATA:  Chest pain and shortness of breath EXAM: CHEST - 2 VIEW COMPARISON:  Chest x-ray dated January 01, 2022 FINDINGS: The heart size and mediastinal contours are within normal limits. Prior median sternotomy. Vascular stent overlying the left upper lobe. Both lungs are clear. The visualized skeletal structures are unremarkable. IMPRESSION: No active cardiopulmonary disease. Electronically Signed   By: Yetta Glassman M.D.   On: 05/18/2022 14:44     Subjective: Pt denies having further CP and no syncopal episodes, ambulating in room.   Discharge Exam: Vitals:   05/19/22 1946 05/20/22 0459  BP: 93/65 (!) 122/94  Pulse: 73 73  Resp: 18 18  Temp: 97.6 F (36.4 C) (!) 96.7 F (35.9 C)  SpO2: 99% 100%   Vitals:   05/19/22 0500 05/19/22 1259 05/19/22 1946 05/20/22 0459  BP: 130/82 (!) 97/59 93/65 (!) 122/94  Pulse: 74 68 73 73  Resp: 18 19 18 18   Temp: 98.4 F (36.9 C) 98.5 F (36.9 C) 97.6 F (36.4 C) (!) 96.7 F (35.9 C)  TempSrc: Oral Oral Oral   SpO2: 98% 97% 99% 100%  Weight:      Height:       General: Pt is alert, awake, not in acute distress Cardiovascular: normal S1/S2 +, no rubs, no gallops Respiratory: CTA bilaterally, no wheezing, no rhonchi Abdominal: Soft, NT, ND, bowel sounds + Extremities: trace pretibial edema, no cyanosis   The results of significant diagnostics from this hospitalization (including imaging, microbiology, ancillary and laboratory) are listed below for  reference.     Microbiology: No results found for this or any previous visit (from the past 240 hour(s)).   Labs: BNP (last 3 results) Recent Labs    08/25/21 2144 01/01/22 1953  BNP 60.0 0000000   Basic Metabolic Panel: Recent Labs  Lab 05/18/22 1441 05/19/22 0426  NA 136 136  K 3.7 3.5  CL 100 104  CO2 26 26  GLUCOSE 104* 84  BUN 15 11  CREATININE 1.03 0.86  CALCIUM 9.3 8.7*  MG  --  2.1   Liver Function Tests: Recent Labs  Lab 05/19/22 0426  AST 16  ALT 13  ALKPHOS 68  BILITOT 1.0  PROT 6.7  ALBUMIN 3.7   No results for input(s): "LIPASE", "AMYLASE" in the last 168 hours. No results for input(s): "AMMONIA" in the last 168 hours. CBC: Recent Labs  Lab 05/18/22 1441 05/19/22 0426  WBC 4.8 3.2*  NEUTROABS  --  1.2*  HGB 11.9* 11.0*  HCT 35.8* 32.4*  MCV 86.7 85.5  PLT 262 221   Cardiac Enzymes: No results for input(s): "CKTOTAL", "CKMB", "CKMBINDEX", "TROPONINI" in the last 168 hours. BNP: Invalid input(s): "POCBNP" CBG: Recent Labs  Lab 05/19/22 0741 05/19/22 1110 05/19/22 1610 05/19/22 1949 05/20/22 0815  GLUCAP 95 92 102* 134* 121*   D-Dimer Recent Labs    05/18/22 1552  DDIMER 0.47   Hgb A1c Recent Labs    05/19/22 0426  HGBA1C 5.9*   Lipid Profile Recent Labs    05/20/22 0423  CHOL 70  HDL 37*  LDLCALC 17  TRIG 82  CHOLHDL 1.9   Thyroid function studies Recent Labs    05/20/22 0423  TSH 1.591   Anemia work up No results for input(s): "VITAMINB12", "FOLATE", "FERRITIN", "TIBC", "IRON", "RETICCTPCT" in the last 72 hours. Urinalysis    Component Value Date/Time   COLORURINE YELLOW 05/18/2022 2230   APPEARANCEUR HAZY (A) 05/18/2022 2230   LABSPEC 1.011 05/18/2022 2230   PHURINE 7.0 05/18/2022 2230   GLUCOSEU NEGATIVE 05/18/2022 2230   HGBUR NEGATIVE 05/18/2022 2230   BILIRUBINUR NEGATIVE 05/18/2022 2230   KETONESUR 20 (A) 05/18/2022 2230   PROTEINUR NEGATIVE 05/18/2022 2230   UROBILINOGEN 1.0 09/25/2011 1513    NITRITE NEGATIVE 05/18/2022 2230   LEUKOCYTESUR LARGE (A) 05/18/2022 2230   Sepsis Labs Recent Labs  Lab 05/18/22 1441 05/19/22 0426  WBC 4.8 3.2*   Microbiology No results found for this or any previous visit (from the past 240 hour(s)).  Time coordinating discharge: 38 mins   SIGNED:  Irwin Brakeman, MD  [facility] 05/20/2022, 11:42 AM How to contact the [facility] Attending or Consulting provider [place] or covering provider during after hours [place], for this patient?  Check the care team in [facility] and look for a) attending/consulting [facility] provider listed and b) the [facility] team listed Log into [email or web address] and use [place]'s universal password to access. If you do not have the password, please contact the hospital operator. Locate the [facility] provider you are looking for under [facility] and page to a number that you can be directly reached. If you still have difficulty reaching the provider, please page the [facility] (Director on Call) for the Hospitalists listed on amion for assistance.

## 2022-05-20 NOTE — Plan of Care (Signed)
  Problem: Acute Rehab PT Goals(only PT should resolve) Goal: Pt Will Go Supine/Side To Sit Outcome: Progressing Goal: Patient Will Transfer Sit To/From Stand Outcome: Progressing Goal: Pt Will Transfer Bed To Chair/Chair To Bed Outcome: Progressing Goal: Pt Will Ambulate Outcome: Progressing Goal: Pt Will Go Up/Down Stairs Outcome: Progressing

## 2022-05-20 NOTE — Evaluation (Signed)
Physical Therapy Evaluation Patient Details Name: Dalton [name] MRN: [id number] DOB: May 14, 1960 Today's Date: 05/20/2022  History of Present Illness  Dalton [name] is a 62 y.o. male with medical history significant of heart failure with preserved ejection fraction, anxiety, depression, COPD, history of DVT, GERD, hyperlipidemia, hypertension, CABG x 1 in 2000, type 2 diabetes, and more presents the ED with a chief complaint of syncopal episodes.  Patient reports that all of his cardiology team is in [place].  He saw them recently, and they were scheduling him for an echo.  He reports he was seeing them because his blood pressure has been going up and down, and that his heart has been slowing down per his report.  According to the cardiology note he was presenting for 1 month follow-up after complaining of ongoing chest pain.  During that appointment they discussed a Myoview that showed decrease in LV function, so they recommended an echo.  Patient presents today because he had several syncopal events, insomnia, and decreased appetite.  He reports he has still been compliant with all of his medications despite the symptoms.  Patient reports that he had a syncopal episode about 4 days ago, 2 syncopal episodes 2 days ago, and 1 today.  He reports that 1 today happen when he was at rest sitting down, started to have chest pain and shortness of breath, so he stood up to try to get his circulation going and was planning to walk around.  Instead he woke up on the floor.  He reports it took 10 to 15 minutes for him to regain his strength to stand back up.  Son at bedside reports that he was out for approximately 30 seconds.  Patient reports that sometimes he does feel dizzy before this happens, but he did not today.  He reports that he did have a chest pain and dyspnea.  He describes the chest pain as being in the center of his chest and feeling like a heart attack.  He describes it as sharp pain.  He feels  clogged so that he cannot breathe.  Other episodes that he had previously were falling out of bed or falling when he tried to get up, all with loss of consciousness per his report.  The only other abnormal thing for him lately is URI symptoms.  He has had a cough, subjective fever, and mucus per his report.  He is taking TheraFlu and it has helped.  He denies dysuria and diarrhea.  He does report foul-smelling urine.  He denies any hematuria, hematochezia, melena.  He complains of his decreased appetite, but he is also on Mounjaro.  Patient has no other complaints at this time.    Clinical Impression  On therapist arrival; patient lying in bed; able to sit up on the edge of the bed on his home taking slightly increased time and using his arms to assist with pulling fully up to sitting.   Patient performs sit to stand using his arms to assist with pushing up and needs minimal guard for safety; patient able to walk in the room with minimal guard to supervision assistance from therapist; slow gait pattern but loss of balance or path deviation noted.  Patient with cast on right wrist from recent carpal tunnel surgery.  He reports chest pain and general pain at a 6/10 at the start of treatment with no increased pain during assessment.  Patient reports general fatigue from not sleeping well.  Patient left in bed with  nurse call button in place and his son at bedside.  Patient will benefit from continued skilled therapy services during the remainder of his hospital stay and at the next recommended venue of care to address deficits and promote return to optimal function.           Recommendations for follow up therapy are one component of a multi-disciplinary discharge planning process, led by the attending physician.  Recommendations may be updated based on patient status, additional functional criteria and insurance authorization.  Follow Up Recommendations       Assistance Recommended at Discharge Set up  Supervision/Assistance  Patient can return home with the following  A little help with walking and/or transfers;A little help with bathing/dressing/bathroom;Help with stairs or ramp for entrance    Equipment Recommendations None recommended by PT  Recommendations for Other Services       Functional Status Assessment Patient has had a recent decline in their functional status and demonstrates the ability to make significant improvements in function in a reasonable and predictable amount of time.     Precautions / Restrictions Precautions Precautions: Fall Precaution Comments: history of falls Restrictions Weight Bearing Restrictions: No Other Position/Activity Restrictions: wearing a cast right hand      Mobility  Bed Mobility Overal bed mobility: Modified Independent             General bed mobility comments: supine to sit with head of bed slightly raised modified independent; uses upper extremities to assist wtih pulling up to sitting; takes extra time    Transfers Overall transfer level: Modified independent Equipment used: None               General transfer comment: sit to stand on first attempt; uses arm on bed to push up to standing; takes extra time    Ambulation/Gait Ambulation/Gait assistance: Modified independent (Device/Increase time), Min guard, Supervision Gait Distance (Feet): 50 Feet Assistive device: None Gait Pattern/deviations: Decreased step length - right, Decreased step length - left       General Gait Details: decreased gait speed  Stairs            Wheelchair Mobility    Modified Rankin (Stroke Patients Only)       Balance Overall balance assessment: Modified Independent                                           Pertinent Vitals/Pain Pain Assessment Pain Assessment: 0-10 Pain Score: 6  Pain Location: chest; body Pain Descriptors / Indicators: Aching Pain Intervention(s): Limited activity within  patient's tolerance, Monitored during session    Home Living Family/patient expects to be discharged to:: Private residence Living Arrangements: Children Available Help at Discharge: Family;Personal care attendant Type of Home: Apartment Home Access: Stairs to enter Entrance Stairs-Rails: Right;Left;Can reach both Entrance Stairs-Number of Steps: 12-13 steps   Home Layout: One level Home Equipment: Conservation officer, nature (2 wheels);Tub bench;Cane - single point      Prior Function Prior Level of Function : Independent/Modified Independent;Needs assist       Physical Assist : ADLs (physical)       ADLs Comments: occasionally needs assist with bathing     Hand Dominance   Dominant Hand: Right    Extremity/Trunk Assessment   Upper Extremity Assessment Upper Extremity Assessment: Generalized weakness;RUE deficits/detail RUE Deficits / Details: cast right wrist and hand secondary to  recent carpal tunnel surgery    Lower Extremity Assessment Lower Extremity Assessment: Generalized weakness    Cervical / Trunk Assessment Cervical / Trunk Assessment: Normal (history of several spinal surgeries)  Communication   Communication: No difficulties  Cognition Arousal/Alertness: Awake/alert Behavior During Therapy: WFL for tasks assessed/performed Overall Cognitive Status: Within Functional Limits for tasks assessed                                 General Comments: pleasant and talkative        General Comments      Exercises     Assessment/Plan    PT Assessment Patient needs continued PT services  PT Problem List Decreased strength;Decreased balance;Pain;Decreased activity tolerance       PT Treatment Interventions Functional mobility training;Patient/family education;Gait training;Therapeutic activities;Therapeutic exercise;Stair training    PT Goals (Current goals can be found in the Care Plan section)  Acute Rehab PT Goals Patient Stated Goal: return  home PT Goal Formulation: With patient Time For Goal Achievement: 06/03/22 Potential to Achieve Goals: Good    Frequency Min 2X/week     Co-evaluation               AM-PAC PT "6 Clicks" Mobility  Outcome Measure Help needed turning from your back to your side while in a flat bed without using bedrails?: None Help needed moving from lying on your back to sitting on the side of a flat bed without using bedrails?: None Help needed moving to and from a bed to a chair (including a wheelchair)?: A Little Help needed standing up from a chair using your arms (e.g., wheelchair or bedside chair)?: A Little Help needed to walk in hospital room?: A Little Help needed climbing 3-5 steps with a railing? : A Lot 6 Click Score: 19    End of Session   Activity Tolerance: Patient tolerated treatment well Patient left: in bed;with call bell/phone within reach;with family/visitor present Nurse Communication: Mobility status PT Visit Diagnosis: Muscle weakness (generalized) (M62.81);History of falling (Z91.81)    Time: 0740-0800 PT Time Calculation (min) (ACUTE ONLY): 20 min   Charges:   PT Evaluation $PT Eval Low Complexity: 1 Low          8:41 AM, 05/20/22 Tywan Siever Small Kemaria Dedic MPT [facility] [place] [phone]

## 2022-05-20 NOTE — Discharge Instructions (Signed)
IMPORTANT INFORMATION: PAY CLOSE ATTENTION   PHYSICIAN DISCHARGE INSTRUCTIONS  Follow with Primary care provider  Avbuere, Edwin, MD  and other consultants as instructed by your Hospitalist Physician  SEEK MEDICAL CARE OR RETURN TO EMERGENCY ROOM IF SYMPTOMS COME BACK, WORSEN OR NEW PROBLEM DEVELOPS   Please note: You were cared for by a hospitalist during your hospital stay. Every effort will be made to forward records to your primary care provider.  You can request that your primary care provider send for your hospital records if they have not received them.  Once you are discharged, your primary care physician will handle any further medical issues. Please note that NO REFILLS for any discharge medications will be authorized once you are discharged, as it is imperative that you return to your primary care physician (or establish a relationship with a primary care physician if you do not have one) for your post hospital discharge needs so that they can reassess your need for medications and monitor your lab values.  Please get a complete blood count and chemistry panel checked by your Primary MD at your next visit, and again as instructed by your Primary MD.  Get Medicines reviewed and adjusted: Please take all your medications with you for your next visit with your Primary MD  Laboratory/radiological data: Please request your Primary MD to go over all hospital tests and procedure/radiological results at the follow up, please ask your primary care provider to get all Hospital records sent to his/her office.  In some cases, they will be blood work, cultures and biopsy results pending at the time of your discharge. Please request that your primary care provider follow up on these results.  If you are diabetic, please bring your blood sugar readings with you to your follow up appointment with primary care.    Please call and make your follow up appointments as soon as possible.    Also Note  the following: If you experience worsening of your admission symptoms, develop shortness of breath, life threatening emergency, suicidal or homicidal thoughts you must seek medical attention immediately by calling 911 or calling your MD immediately  if symptoms less severe.  You must read complete instructions/literature along with all the possible adverse reactions/side effects for all the Medicines you take and that have been prescribed to you. Take any new Medicines after you have completely understood and accpet all the possible adverse reactions/side effects.   Do not drive when taking Pain medications or sleeping medications (Benzodiazepines)  Do not take more than prescribed Pain, Sleep and Anxiety Medications. It is not advisable to combine anxiety,sleep and pain medications without talking with your primary care practitioner  Special Instructions: If you have smoked or chewed Tobacco  in the last 2 yrs please stop smoking, stop any regular Alcohol  and or any Recreational drug use.  Wear Seat belts while driving.  Do not drive if taking any narcotic, mind altering or controlled substances or recreational drugs or alcohol.       

## 2022-05-20 NOTE — Progress Notes (Addendum)
Rounding Note    Patient Name: Dalton [name] Date of Encounter: 05/20/2022  [facility] Cardiologist: Previously Dr. Tamala Julian  Subjective   No recurrent chest pain or syncopal events. No orthopnea or PND overnight. Feels ready for discharge.   Inpatient Medications    Scheduled Meds:  ALPRAZolam  1 mg Oral TID   atorvastatin  80 mg Oral Daily   clopidogrel  75 mg Oral Daily   ezetimibe  10 mg Oral Daily   famotidine  20 mg Oral QHS   furosemide  40 mg Oral Daily   heparin  5,000 Units Subcutaneous Q8H   insulin aspart  0-15 Units Subcutaneous TID WC   insulin aspart  0-5 Units Subcutaneous QHS   lidocaine  1-2 patch Transdermal Q24H   lisinopril  10 mg Oral Daily   metoprolol tartrate  50 mg Oral BID   pantoprazole  40 mg Oral Daily   sertraline  100 mg Oral Daily   tamsulosin  0.4 mg Oral QPC breakfast   Continuous Infusions:  PRN Meds: acetaminophen **OR** acetaminophen, ipratropium, ondansetron **OR** ondansetron (ZOFRAN) IV, oxyCODONE   Vital Signs    Vitals:   05/19/22 0500 05/19/22 1259 05/19/22 1946 05/20/22 0459  BP: 130/82 (!) 97/59 93/65 (!) 122/94  Pulse: 74 68 73 73  Resp: 18 19 18 18   Temp: 98.4 F (36.9 C) 98.5 F (36.9 C) 97.6 F (36.4 C) (!) 96.7 F (35.9 C)  TempSrc: Oral Oral Oral   SpO2: 98% 97% 99% 100%  Weight:      Height:        Intake/Output Summary (Last 24 hours) at 05/20/2022 0918 Last data filed at 05/20/2022 0844 Gross per 24 hour  Intake 1320 ml  Output --  Net 1320 ml      05/18/2022    7:43 PM 05/18/2022    2:17 PM 05/11/2022    8:00 AM  Last 3 Weights  Weight (lbs) 266 lb 1.6 oz 276 lb 278 lb 12.8 oz  Weight (kg) 120.702 kg 125.193 kg 126.463 kg      Telemetry    NSR, HR in 70's to 80's. Brief bursts of SVT but improved from yesterday.  - Personally Reviewed  ECG    No new tracings.   Physical Exam   GEN: Pleasant male appearing in no acute distress.   Neck: No JVD Cardiac: RRR, no murmurs,  rubs, or gallops.  Respiratory: Clear to auscultation bilaterally without wheezing or rales. GI: Soft, nontender, non-distended  MS: trace ankle edema bilaterally; No deformity. Wrap in place along right forearm.  Neuro:  Nonfocal  Psych: Normal affect   Labs    High Sensitivity Troponin:   Recent Labs  Lab 05/18/22 1441 05/18/22 1552 05/19/22 0426  TROPONINIHS 2 <2 3     Chemistry Recent Labs  Lab 05/18/22 1441 05/19/22 0426  NA 136 136  K 3.7 3.5  CL 100 104  CO2 26 26  GLUCOSE 104* 84  BUN 15 11  CREATININE 1.03 0.86  CALCIUM 9.3 8.7*  MG  --  2.1  PROT  --  6.7  ALBUMIN  --  3.7  AST  --  16  ALT  --  13  ALKPHOS  --  68  BILITOT  --  1.0  GFRNONAA >60 >60  ANIONGAP 10 6    Lipids  Recent Labs  Lab 05/20/22 0423  CHOL 70  TRIG 82  HDL 37*  LDLCALC 17  CHOLHDL 1.9  Hematology Recent Labs  Lab 05/18/22 1441 05/19/22 0426  WBC 4.8 3.2*  RBC 4.13* 3.79*  HGB 11.9* 11.0*  HCT 35.8* 32.4*  MCV 86.7 85.5  MCH 28.8 29.0  MCHC 33.2 34.0  RDW 13.8 13.7  PLT 262 221   Thyroid  Recent Labs  Lab 05/20/22 0423  TSH 1.591    BNPNo results for input(s): "BNP", "PROBNP" in the last 168 hours.  DDimer  Recent Labs  Lab 05/18/22 1552  DDIMER 0.47     Radiology    US Carotid Bilateral  Result Date: 05/19/2022 CLINICAL DATA:  Hypertension, syncope, coronary artery disease EXAM: BILATERAL CAROTID DUPLEX ULTRASOUND TECHNIQUE: Pearline Cables scale imaging, color Doppler and duplex ultrasound were performed of bilateral carotid and vertebral arteries in the neck. COMPARISON:  None Available. FINDINGS: Criteria: Quantification of carotid stenosis is based on velocity parameters that correlate the residual internal carotid diameter with NASCET-based stenosis levels, using the diameter of the distal internal carotid lumen as the denominator for stenosis measurement. The following velocity measurements were obtained: RIGHT ICA: 76/28 cm/sec CCA: [id number] cm/sec  SYSTOLIC ICA/CCA RATIO:  0.5 ECA: 182 cm/sec LEFT ICA: 89/34 cm/sec CCA: [id number] cm/sec SYSTOLIC ICA/CCA RATIO:  0.7 ECA: 137 cm/sec RIGHT CAROTID ARTERY: Mild tortuosity. Smooth intimal thickening in the distal common carotid artery. Minimal plaque in the bulb. No significant stenosis. Normal waveforms and color Doppler signal throughout. RIGHT VERTEBRAL ARTERY:  Normal flow direction and waveform. LEFT CAROTID ARTERY: Minimal smooth plaque in the bulb. Normal waveforms and color Doppler signal throughout. No significant stenosis. LEFT VERTEBRAL ARTERY:  Normal flow direction and waveform. IMPRESSION: 1. Minimal bilateral carotid bifurcation plaque resulting in less than 50% diameter ICA stenosis. 2. Antegrade bilateral vertebral arterial flow. Electronically Signed   By: Lucrezia Europe M.D.   On: 05/19/2022 10:44   CT Head Wo Contrast  Result Date: 05/18/2022 CLINICAL DATA:  Neuro deficit, acute, stroke suspected EXAM: CT HEAD WITHOUT CONTRAST TECHNIQUE: Contiguous axial images were obtained from the base of the skull through the vertex without intravenous contrast. RADIATION DOSE REDUCTION: This exam was performed according to the departmental dose-optimization program which includes automated exposure control, adjustment of the mA and/or kV according to patient size and/or use of iterative reconstruction technique. COMPARISON:  CT head December 10, 2015. FINDINGS: Brain: No evidence of acute infarction, hemorrhage, hydrocephalus, extra-axial collection or mass lesion/mass effect. Vascular: No hyperdense vessel identified. Skull: No acute fracture. Sinuses/Orbits: Clear sinuses.  No acute orbital findings. Other: No mastoid effusions. IMPRESSION: No evidence of acute intracranial abnormality. Electronically Signed   By: Margaretha Sheffield M.D.   On: 05/18/2022 17:05   DG Chest 2 View  Result Date: 05/18/2022 CLINICAL DATA:  Chest pain and shortness of breath EXAM: CHEST - 2 VIEW COMPARISON:  Chest x-ray dated  January 01, 2022 FINDINGS: The heart size and mediastinal contours are within normal limits. Prior median sternotomy. Vascular stent overlying the left upper lobe. Both lungs are clear. The visualized skeletal structures are unremarkable. IMPRESSION: No active cardiopulmonary disease. Electronically Signed   By: Yetta Glassman M.D.   On: 05/18/2022 14:44    Cardiac Studies   Echocardiogram: 05/19/2022 IMPRESSIONS     1. Left ventricular ejection fraction, by estimation, is 50 to 55%. The  left ventricle has low normal function. The left ventricle has no regional  wall motion abnormalities. Left ventricular diastolic parameters are  consistent with Grade I diastolic  dysfunction (impaired relaxation).   2. Right ventricular systolic function is low  normal. The right  ventricular size is normal. There is normal pulmonary artery systolic  pressure. The estimated right ventricular systolic pressure is 99991111 mmHg.   3. The mitral valve is grossly normal. Trivial mitral valve  regurgitation.   4. The aortic valve is tricuspid. Aortic valve regurgitation is not  visualized.   5. The inferior vena cava is normal in size with greater than 50%  respiratory variability, suggesting right atrial pressure of 3 mmHg.   Comparison(s): Prior images reviewed side by side. LVEF low normal range  at 50-55%.    Patient Profile     62 y.o. 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   Assessment & Plan   1. Chest Pain with Mixed Features - He has a longstanding history of chest pain  (previously underwent CABG in 2002 but had an atretic LIMA by follow-up catheterization in 2013 and Coronary CT in 2021 showed a calcium score of 0).  Recent NST in 03/2022 was low-risk as outlined above. Prior MI's were felt to be due to cocaine use and UDS negative for cocaine this admission.  - Echo this admission shows his EF is at 50-55% and given recent NST in 03/2022, there are no plans for further ischemic testing this admission.  - Continue Atorvastatin 80 mg daily, Zetia 10 mg daily and Lopressor 50 mg twice daily. No indication for Plavix from a cardiology perspective but he has perhaps been on this for his PAD. For discharge, Imdur has been stopped due to concurrent use of Sildenafil.    2.  Syncope - Unclear etiology as his episodes occurred at rest and he did not have any prodromal symptoms. Orthostatics negative this admission. Carotid dopplers showed minimal plaque with no significant stenosis. Telemetry shows possible very brief episodes of SVT but no significant arrhythmias to account for syncope thus far. Also, his brief SVT is not new as prior monitor in 05/2021 showed a 25% burden of nonsustained SVT. Given that Lopressor has been adjusted in the interim, could consider an outpatient monitor for reassessment of his burden.  - He was on Imdur prior to admission and this has been stopped given the use of Sildenafil. Of note, he denies taking Sildenafil around the time of his syncopal episodes this past weekend so likely not a factor at that time.    3. HFpEF - EF was at 60-65% by echo in 10/2021 and at 50-55% this admission. He has been continued on Lasix 40mg  daily with HCTZ being stopped. Would be a good candidate for an SGLT2 inhibitor but would need to likely adjust his diabetic medications as he is on Metformin, Mounjaro and Glipizide as an outpatient.    4. HLD - LDL at 17 this admission. Continue Repatha as an outpatient. He is also on Atorvastatin 80mg  daily and Zetia 10mg   daily. Given his excellent LDL, could likely stop Zetia to reduce his pill burden.    5. HTN - BP has been variable from 93/65 to 122/94 in the past 24 hours. Continue Lisinopril 10mg  daily and Lopressor 50mg   BID. HCTZ has been discontinued to avoid dual diuretic therapy as he is on Lasix.    He has previously scheduled follow-up in [place] on 06/15/2022 and will keep that appointment. As mentioned above, HCTZ has been stopped this admission and he will need a new Rx for Lisinopril and Imdur has been stopped as well.    For questions or updates, please contact [place] Please consult [email or web address] for contact info under        Signed, Erma Heritage, PA-C  05/20/2022, 9:18 AM    Attending note Patient seen and discussed with PA Ahmed Prima, I agree with her documentation  1.Chest pain - prior history of CAD with CABG in 2002 with a single  LIMA-LAD - long history of chest pain multiple ischemic evaluations - 09/2019 coronary CTA known atretic LIMA otherwise patent vessels - 05/2021 nuclear stress no ischemia, 03/2022 nuclear stress no ischemia  - no objective evidence of ischemia by EKG or enzymes -04/2022 echo LVEF 50-55%, no WMAs, grade I dd  - no plans for repeat ischemic testing at this time   2. Syncope - unclear etiology - PACs and short runs SVT chronic for him and would not lead to syncope - orthostatics negative by bp but abnormal by HR, up 31 beats with standing - he is on oral lasix 40, imdur 90, HCTZ 12.5, doxazosin at home and uses prn sildenafil - CT head benign, carotid US benign, echo benign, tele benign  -on my history reports 2 episodes occurred after just starting to stand up out of bed, 3rd episode occurred while walking in the kitchen - suspect orhostatic syncope. He reports adequate hydration, likely related to his medciation regimen. We have stopped imdur and HCTZ   Ok for d/c, we will arrange outpatient f/u  Carlyle Dolly MD

## 2022-06-08 ENCOUNTER — Other Ambulatory Visit (HOSPITAL_COMMUNITY): Payer: 59

## 2022-06-13 NOTE — Progress Notes (Deleted)
Office Visit    Patient Name: Dalton [name] Date of Encounter: 06/13/2022  Primary Care Provider:  Fleet Contras, MD Primary Cardiologist:  None Primary Electrophysiologist: None   Past Medical History    Past Medical History:  Diagnosis Date   (HFpEF) heart failure with preserved ejection fraction (HCC)    Anemia    Anxiety    Asthma    Bipolar disorder (HCC)    Chronic back pain    [facility] in [place]   Chronic bronchitis    COPD (chronic obstructive pulmonary disease) (HCC)    Coronary artery disease 2002   CABG (LIMA to LAD)   Depression    DVT (deep venous thrombosis) (HCC) ~ 2005   LLE   Frequency of urination    GERD (gastroesophageal reflux disease)    Headache(784.0)    History of seizures    Last 2011   HNP (herniated nucleus pulposus), cervical    Hyperlipidemia    Hypertension    MI (myocardial infarction) (HCC)    Neuropathy    NSAID-induced gastric ulcer    Rheumatoid arthritis (HCC)    Tonsillitis, chronic    Dr. [name] in [place]   Type II diabetes mellitus [facility])    Wears glasses    Past Surgical History:  Procedure Laterality Date   ANTERIOR CERVICAL DECOMP/DISCECTOMY FUSION N/A 11/05/2016   Procedure: Anterior Cervical Discectomy and Fusion - Cervical three-Cervical four;  Surgeon: Julio Sicks, MD;  Location: [facility] OR;  Service: Neurosurgery;  Laterality: N/A;   BACK SURGERY     x3   BALLOON DILATION N/A 12/04/2019   Procedure: BALLOON DILATION;  Surgeon: Lanelle Bal, DO;  Location: [facility] SUITE;  Service: Endoscopy;  Laterality: N/A;   BIOPSY N/A 05/30/2012   Procedure: BIOPSY;  Surgeon: West Bali, MD;  Location: [facility] ORS;  Service: Endoscopy;  Laterality: N/A;   BIOPSY  03/02/2016   Procedure: BIOPSY;  Surgeon: West Bali, MD;  Location: [facility];  Service: Endoscopy;;  gastric   BIOPSY  12/04/2019   Procedure: BIOPSY;  Surgeon: Lanelle Bal, DO;  [facility]: [facility];  Service: Endoscopy;;    CARDIAC CATHETERIZATION  "several"   CARPAL TUNNEL RELEASE Bilateral    CATARACT EXTRACTION W/PHACO Right 06/15/2016   Procedure: CATARACT EXTRACTION PHACO AND INTRAOCULAR LENS PLACEMENT (IOC);  Surgeon: Jethro Bolus, MD;  Location: [facility] ORS;  Service: Ophthalmology;  Laterality: Right;  CDE: 4.64   CATARACT EXTRACTION W/PHACO Left 07/13/2016   Procedure: CATARACT EXTRACTION PHACO AND INTRAOCULAR LENS PLACEMENT (IOC);  Surgeon: Jethro Bolus, MD;  Location: [facility] ORS;  Service: Ophthalmology;  Laterality: Left;  CDE: 3.15   COLONOSCOPY  12/28/2010   SLF: (MAC)Internal hemorrhoids/four small colon polyps tubular adenomas. per SLF: colonoscopy 2022   COLONOSCOPY WITH PROPOFOL N/A 07/05/2017   Surgeon: West Bali, MD; tubular adenoma, hyperplastic polyp, submucosal nodule at hepatic flexure consistent with lipoma on biopsy, diverticulosis and rectosigmoid and sigmoid colon, rectal bleeding due to large internal hemorrhoids.  Recommended repeat in 3 years due to history of polyps and oily film on scope.   CORONARY ARTERY BYPASS GRAFT  2002   3 vessels   ESOPHAGOGASTRODUODENOSCOPY N/A 05/30/2012   SLF: UNCONTROLLED GERD DUE TO LIFESTYLE CHOICE/WEIGHT GAIN/MILD Non-erosive gastritis   ESOPHAGOGASTRODUODENOSCOPY (EGD) WITH PROPOFOL N/A 03/02/2016   Dr. [name]: Esophagus appeared normal, impaired dilation performed, patchy inflammation with edema and erythema of the entire stomach., Biopsy with H pylori, patient completed Pylera.  ESOPHAGOGASTRODUODENOSCOPY (EGD) WITH PROPOFOL N/A 12/04/2019   Surgeon: Lanelle Bal, DO; benign appearing esophageal stenosis dilated, gastritis biopsied (mild reactive gastropathy, gastritis, negative H. pylori), normal duodenum.   FRACTURE SURGERY     LEFT HEART CATHETERIZATION WITH CORONARY ANGIOGRAM N/A 08/31/2011   Procedure: LEFT HEART CATHETERIZATION WITH CORONARY ANGIOGRAM;  Surgeon: [name], MD;  Location: [facility] CATH LAB;  Service: Cardiovascular;   Laterality: N/A;   LUMBAR DISC SURGERY     "L4-5; Dr. Dutch Quint"   LUMBAR LAMINECTOMY/DECOMPRESSION MICRODISCECTOMY Right 03/06/2019   Procedure: MICRODISCECTOMY EXTRAFORAMINAL LUMBAR THREE - LUMBAR FOUR RIGHT;  Surgeon: Julio Sicks, MD;  Location: [facility] OR;  Service: Neurosurgery;  Laterality: Right;  MICRODISCECTOMY EXTRAFORAMINAL LUMBAR THREE - LUMBAR FOUR RIGHT   MULTIPLE TOOTH EXTRACTIONS     ORIF MANDIBULAR FRACTURE N/A 12/19/2015   Procedure: OPEN REDUCTION INTERNAL FIXATION (ORIF) MANDIBULAR FRACTURE;  Surgeon: [name], MD;  Location: [facility] OR;  Service: ENT;  Laterality: N/A;   PATELLA FRACTURE SURGERY Left 1976   plate to knee cap from accident   POLYPECTOMY  07/05/2017   Procedure: POLYPECTOMY;  Surgeon: West Bali, MD;  [facility];  Service: Endoscopy;;  colon   SAVORY DILATION  12/28/2010   SLF:(MAC)J-shaped stomach/nodular mocosa in the distal esophagus/empiric dilation 16mm   SAVORY DILATION N/A 03/02/2016   Procedure: SAVORY DILATION;  Surgeon: West Bali, MD;  [facility]: [facility];  Service: Endoscopy;  Laterality: N/A;    Allergies  Allergies  Allergen Reactions   Gabapentin Hives   Ibuprofen Other (See Comments)    Stomach  Upset and stomach ulcers   Zolpidem Tartrate     Other Reaction(s): Not available   Naproxen Other (See Comments)    HALLUCINATIONS     History of Present Illness   Dalton [name] is a 62 y.o. male with PMH of CAD s/p CABG x 1 in 2002 (LIMA to LAD), HFpEF, HTN, HLD, DM II, rheumatoid arthritis, DVT, bipolar disorder, peptic ulcer disease, left subclavian stent, past cocaine abuse who presents today for 1 month follow-up and recent ED visit for unstable angina.  Dalton [name] was last seen on 05/11/2022 and underwent ischemic evaluation prior to carpal tunnel surgery.  Lexiscan Myoview was completed and showed no evidence of ischemia however LV function was decreased.  He was advised to complete a echocardiogram for further  evaluation.  The echo results showed trivial MVR with EF of 50-55% (low normal) and grade 1 DD.  He also underwent carotid ultrasound that showed minimal bilateral carotid bifurcation plaque with less than 50% diameter ICA stenosis.  He was seen in the ED on 05/18/2022 with chest discomfort and report of 2 syncopal episodes.  He described chest pain as moderate and non radiating located at the left chest.  EKG noted brief burst of SVT that was not new in comparison to previous event monitor results.  Imdur was discontinued due to subsequent use of Viagra.  Patient noted no remote use of Viagra or prior or during syncopal episodes.   Since last being seen in the office patient reports***.  Patient denies chest pain, palpitations, dyspnea, PND, orthopnea, nausea, vomiting, dizziness, syncope, edema, weight gain, or early satiety.  ***Notes: -Patient will need outpatient ZIO completed Home Medications    Current Outpatient Medications  Medication Sig Dispense Refill   albuterol (PROVENTIL HFA;VENTOLIN HFA) 108 (90 Base) MCG/ACT inhaler Inhale 2 puffs into the lungs every 6 (six) hours as needed for wheezing or shortness of  breath.      albuterol (PROVENTIL) (2.5 MG/3ML) 0.083% nebulizer solution Take 2.5 mg by nebulization every 6 (six) hours as needed for wheezing or shortness of breath.      ALPRAZolam (XANAX) 1 MG tablet Take 1 mg by mouth 4 (four) times daily.     atorvastatin (LIPITOR) 80 MG tablet Take 80 mg by mouth at bedtime.     azelastine (ASTELIN) 0.1 % nasal spray Place 1 spray into both nostrils 2 (two) times daily.     beclomethasone (QVAR) 80 MCG/ACT inhaler Inhale 2 puffs into the lungs 2 (two) times daily as needed (respiratory issues.).      BELSOMRA 15 MG TABS Take 1 tablet by mouth at bedtime.     benzonatate (TESSALON) 100 MG capsule Take 1 capsule (100 mg total) by mouth 3 (three) times daily as needed for cough. 21 capsule 0   brimonidine (ALPHAGAN P) 0.1 % SOLN Place 1 drop  into the right eye 2 (two) times daily.     cetirizine (ZYRTEC) 10 MG tablet Take 10 mg by mouth daily as needed for allergies.   0   clopidogrel (PLAVIX) 75 MG tablet Take 75 mg by mouth daily.     dexlansoprazole (DEXILANT) 60 MG capsule Take 1 capsule (60 mg total) by mouth daily. 30 capsule 5   Evolocumab (REPATHA SURECLICK) 140 MG/ML SOAJ Take 140 mg by mouth every 14 (fourteen) days. 2 mL 11   ezetimibe (ZETIA) 10 MG tablet Take 10 mg by mouth daily.     famotidine (PEPCID) 20 MG tablet Take 20 mg by mouth at bedtime.     fluticasone (FLONASE) 50 MCG/ACT nasal spray Place 2 sprays into both nostrils daily.     Fluticasone Furoate (ARNUITY ELLIPTA) 200 MCG/ACT AEPB Inhale 1 puff into the lungs daily as needed (shortness of breath).      furosemide (LASIX) 40 MG tablet Take 1 tablet (40 mg total) by mouth daily. Can take an additional 40mg  tab as needed for swelling or weight gain 100 tablet 1   glipiZIDE (GLUCOTROL) 10 MG tablet Take 10 mg by mouth daily before breakfast.     ipratropium (ATROVENT) 0.02 % nebulizer solution Take 0.5 mg by nebulization 2 (two) times daily as needed for wheezing or shortness of breath (for congestion).      ketoconazole (NIZORAL) 2 % shampoo Apply 1 application topically daily as needed for irritation.      latanoprost (XALATAN) 0.005 % ophthalmic solution Place 1 drop into both eyes at bedtime.     LINZESS 290 MCG CAPS capsule TAKE 1 CAPSULE(290 MCG) BY MOUTH DAILY BEFORE BREAKFAST (Patient taking differently: Take 290 mcg by mouth daily before breakfast. May take 1 additional capsule as needed for constipation) 90 capsule 3   lisinopril (ZESTRIL) 10 MG tablet Take 1 tablet (10 mg total) by mouth daily. 30 tablet 1   magnesium citrate SOLN Take 1 Bottle by mouth as needed for moderate constipation.      metFORMIN (GLUCOPHAGE-XR) 500 MG 24 hr tablet Take 500 mg by mouth daily with breakfast.      metoprolol tartrate (LOPRESSOR) 50 MG tablet TAKE 1 TABLET(50 MG)  BY MOUTH TWICE DAILY 180 tablet 3   MOUNJARO 2.5 MG/0.5ML Pen Inject 2.5 mg into the skin once a week.     Multiple Vitamin (MULTIVITAMIN WITH MINERALS) TABS tablet Take 1 tablet by mouth daily.     NARCAN 4 MG/0.1ML LIQD nasal spray kit Place 1 spray into the  nose as needed (opioid overdose).   0   nitroGLYCERIN (NITROSTAT) 0.4 MG SL tablet Place 1 tablet (0.4 mg total) under the tongue every 5 (five) minutes as needed for chest pain.     oxyCODONE-acetaminophen (PERCOCET) 10-325 MG tablet Take 1 tablet by mouth every 4 (four) hours as needed for pain.     potassium chloride SA (KLOR-CON) 20 MEQ tablet Take 20 mEq by mouth 2 (two) times daily.     sertraline (ZOLOFT) 100 MG tablet Take 100 mg by mouth daily.     sildenafil (VIAGRA) 100 MG tablet Take by mouth as needed for erectile dysfunction.     sucralfate (CARAFATE) 1 GM/10ML suspension in the morning, at noon, in the evening, and at bedtime.     tamsulosin (FLOMAX) 0.4 MG CAPS capsule Take 1 capsule (0.4 mg total) by mouth daily after breakfast. 30 capsule 0   TRULANCE 3 MG TABS Take 1 tablet by mouth daily.     Vitamin D, Ergocalciferol, (DRISDOL) 1.25 MG (50000 UT) CAPS capsule Take 50,000 Units by mouth every 7 (seven) days. Monday     No current facility-administered medications for this visit.     Review of Systems  Please see the history of present illness.    (+)*** (+)***  All other systems reviewed and are otherwise negative except as noted above.  Physical Exam    Wt Readings from Last 3 Encounters:  05/18/22 266 lb 1.6 oz (120.7 kg)  05/11/22 278 lb 12.8 oz (126.5 kg)  04/19/22 283 lb (128.4 kg)   ZO:XWRUE were no vitals filed for this visit.,There is no height or weight on file to calculate BMI.  Constitutional:      Appearance: Healthy appearance. Not in distress.  Neck:     Vascular: JVD normal.  Pulmonary:     Effort: Pulmonary effort is normal.     Breath sounds: No wheezing. No rales. Diminished in the  bases Cardiovascular:     Normal rate. Regular rhythm. Normal S1. Normal S2.      Murmurs: There is no murmur.  Edema:    Peripheral edema absent.  Abdominal:     Palpations: Abdomen is soft non tender. There is no hepatomegaly.  Skin:    General: Skin is warm and dry.  Neurological:     General: No focal deficit present.     Mental Status: Alert and oriented to person, place and time.     Cranial Nerves: Cranial nerves are intact.  EKG/LABS/ Recent Cardiac Studies    ECG personally reviewed by me today - ***  Cardiac Studies & Procedures     STRESS TESTS  MYOCARDIAL PERFUSION IMAGING 04/20/2022  Narrative   The study is normal. The study is low risk.   No ST deviation was noted.   LV perfusion is normal. There is no evidence of ischemia. There is no evidence of infarction.   Left ventricular function is normal. Nuclear stress EF: 47 %. The left ventricular ejection fraction is mildly decreased (45-54%). End diastolic cavity size is normal. End systolic cavity size is normal.   Prior study available for comparison from 05/25/2021.   ECHOCARDIOGRAM  ECHOCARDIOGRAM COMPLETE 05/19/2022  Narrative ECHOCARDIOGRAM REPORT    Patient Name:   Dalton [name] Date of Exam: 05/19/2022 Medical Rec #:  [id number]         Height:       70.0 in Accession #:    [id number]        Weight:  266.1 lb Date of Birth:  Apr 11, 1960         BSA:          2.357 m Patient Age:    61 years          BP:           130/82 mmHg Patient Gender: M                 HR:           86 bpm. [facility]  Procedure: 2D Echo, Cardiac Doppler, Color Doppler and Intracardiac Opacification Agent  Indications:    Syncope R55  History:        Patient has prior history of Echocardiogram examinations, most recent 11/10/2021. CHF, CAD and Previous Myocardial Infarction, Signs/Symptoms:Chest Pain and Syncope; Risk Factors:Hypertension, Diabetes, Dyslipidemia and Non-Smoker.  Sonographer:    Aron Baba Referring Phys: [id number] ASIA B ZIERLE-GHOSH   Sonographer Comments: Image acquisition challenging due to patient body habitus, Image acquisition challenging due to COPD and Image acquisition challenging due to respiratory motion. IMPRESSIONS   1. Left ventricular ejection fraction, by estimation, is 50 to 55%. The left ventricle has low normal function. The left ventricle has no regional wall motion abnormalities. Left ventricular diastolic parameters are consistent with Grade I diastolic dysfunction (impaired relaxation). 2. Right ventricular systolic function is low normal. The right ventricular size is normal. There is normal pulmonary artery systolic pressure. The estimated right ventricular systolic pressure is 13.6 mmHg. 3. The mitral valve is grossly normal. Trivial mitral valve regurgitation. 4. The aortic valve is tricuspid. Aortic valve regurgitation is not visualized. 5. The inferior vena cava is normal in size with greater than 50% respiratory variability, suggesting right atrial pressure of 3 mmHg.  Comparison(s): Prior images reviewed side by side. LVEF low normal range at 50-55%.  FINDINGS Left Ventricle: Left ventricular ejection fraction, by estimation, is 50 to 55%. The left ventricle has low normal function. The left ventricle has no regional wall motion abnormalities. Definity contrast agent was given IV to delineate the left ventricular endocardial borders. The left ventricular internal cavity size was normal in size. There is borderline left ventricular hypertrophy. Left ventricular diastolic parameters are consistent with Grade I diastolic dysfunction (impaired relaxation).  Right Ventricle: The right ventricular size is normal. No increase in right ventricular wall thickness. Right ventricular systolic function is low normal. There is normal pulmonary artery systolic pressure. The tricuspid regurgitant velocity is 1.63 m/s, and with an assumed right atrial  pressure of 3 mmHg, the estimated right ventricular systolic pressure is 13.6 mmHg.  Left Atrium: Left atrial size was normal in size.  Right Atrium: Right atrial size was normal in size.  Pericardium: There is no evidence of pericardial effusion.  Mitral Valve: The mitral valve is grossly normal. Trivial mitral valve regurgitation.  Tricuspid Valve: The tricuspid valve is grossly normal. Tricuspid valve regurgitation is trivial.  Aortic Valve: The aortic valve is tricuspid. There is mild aortic valve annular calcification. Aortic valve regurgitation is not visualized.  Pulmonic Valve: The pulmonic valve was grossly normal. Pulmonic valve regurgitation is trivial.  Aorta: The aortic root is normal in size and structure.  Venous: The inferior vena cava is normal in size with greater than 50% respiratory variability, suggesting right atrial pressure of 3 mmHg.  IAS/Shunts: No atrial level shunt detected by color flow Doppler.   LEFT VENTRICLE PLAX 2D LVIDd:         4.50  cm   Diastology LVIDs:         3.30 cm   LV e' medial:    7.40 cm/s LV PW:         1.00 cm   LV E/e' medial:  9.2 LV IVS:        0.90 cm   LV e' lateral:   7.72 cm/s LVOT diam:     2.30 cm   LV E/e' lateral: 8.8 LV SV:         72 LV SV Index:   30 LVOT Area:     4.15 cm   RIGHT VENTRICLE RV S prime:     7.04 cm/s TAPSE (M-mode): 1.2 cm  LEFT ATRIUM             Index        RIGHT ATRIUM           Index LA diam:        4.20 cm 1.78 cm/m   RA Area:     15.60 cm LA Vol (A2C):   54.3 ml 23.04 ml/m  RA Volume:   35.30 ml  14.98 ml/m LA Vol (A4C):   52.4 ml 22.23 ml/m LA Biplane Vol: 54.2 ml 23.00 ml/m AORTIC VALVE             PULMONIC VALVE LVOT Vmax:   94.30 cm/s  PR End Diast Vel: 9.61 msec LVOT Vmean:  60.800 cm/s LVOT VTI:    0.173 m  AORTA Ao Root diam: 3.50 cm Ao Asc diam:  2.70 cm  MITRAL VALVE               TRICUSPID VALVE MV Area (PHT): 4.36 cm    TR Peak grad:   10.6 mmHg MV Decel Time:  174 msec    TR Vmax:        163.00 cm/s MV E velocity: 68.10 cm/s MV A velocity: 78.80 cm/s  SHUNTS MV E/A ratio:  0.86        Systemic VTI:  0.17 m Systemic Diam: 2.30 cm  Nona Dell MD Electronically signed by Nona Dell MD Signature Date/Time: 05/19/2022/11:06:49 AM    Final    MONITORS  LONG TERM MONITOR (3-14 DAYS) 05/29/2021  Narrative  Normal sinus rhythm  Frequent, high burden (25%), symptomatic non-sustained SVT  Longest SVT run lasted 25 seconds at 111 bpm. Fastest SVT > 200 bpm.   Patch Wear Time:  6 days and 1 hours (2023-03-23T15:12:21-0400 to 2023-03-29T16:43:03-398)  Patient had a min HR of 53 bpm, max HR of 255 bpm, and avg HR of 81 bpm. Predominant underlying rhythm was Sinus Rhythm. Slight P wave morphology changes were noted. 1 run of Ventricular Tachycardia occurred lasting 4 beats with a max rate of 138 bpm (avg 111 bpm). 16109 Supraventricular Tachycardia runs occurred, the run with the fastest interval lasting 6 beats with a max rate of 255 bpm, the longest lasting 25.5 secs with an avg rate of 114 bpm. Supraventricular Tachycardia was detected within +/- 45 seconds of symptomatic patient event(s). Isolated SVEs were frequent (9.6%, [id number]), SVE Couplets were frequent (5.1%, 17485), and SVE Triplets were frequent (9.9%, [id number]). Isolated VEs were rare (<1.0%, 103), VE Couplets were rare (<1.0%, 40), and VE Triplets were rare (<1.0%, 3).   CT SCANS  CT CORONARY MORPH W/CTA COR W/SCORE 10/18/2019  Addendum 10/18/2019  5:24 PM ADDENDUM REPORT: 10/18/2019 17:21  CLINICAL DATA:  72M with hypertension, hyperlipidemia, diabetes, PAD, prior CABG with atretic LIMA, and  prior cocaine abuse with chest pain.  EXAM: Cardiac/Coronary  CT  TECHNIQUE: The patient was scanned on a [employer].  FINDINGS: A 120 kV prospective scan was triggered in the descending thoracic aorta at 111 HU's. Axial non-contrast 3 mm slices were carried  out through the heart. The data set was analyzed on a dedicated work station and scored using the Agatson method. Gantry rotation speed was 250 msecs and collimation was .6 mm. No beta blockade and 0.8 mg of sl NTG was given. The 3D data set was reconstructed in 5% intervals of the 67-82 % of the R-R cycle. Diastolic phases were analyzed on a dedicated work station using MPR, MIP and VRT modes. The patient received 80 cc of contrast.  Aorta: Normal size. Ascending aorta 2.7 cm. No calcifications. No dissection.  Aortic Valve:  Trileaflet.  No calcifications.  Coronary Arteries:  Normal coronary origin.  Right dominance.  RCA is a large dominant artery that gives rise to PDA and PLVB. There is no plaque.  Left main is a large artery that gives rise to LAD and LCX arteries.  LAD is a large vessel that has no plaque. Surgical clips noted near the mid LAD. LIMA is atretic. There is a large D1 without plaque.  LCX is a non-dominant artery that gives rise to a small OM1, large, branching OM2, and a small OM3. There is no plaque.  Other findings:  Normal pulmonary vein drainage into the left atrium.  Normal let atrial appendage without a thrombus.  Normal size of the pulmonary artery.  IMPRESSION: 1. Coronary calcium score of 0. This was 0 percentile for age and sex matched control.  2. Normal coronary origin with right dominance.  3. No evidence of CAD.  Chilton Si, MD   Electronically Signed By: Chilton Si On: 10/18/2019 17:21  Narrative EXAM: OVER-READ INTERPRETATION  CT CHEST  The following report is an over-read performed by radiologist Dr. Trudie Reed of [facility] on 10/18/2019. This over-read does not include interpretation of cardiac or coronary anatomy or pathology. The coronary calcium score/coronary CTA interpretation by the cardiologist is attached.  COMPARISON:  Chest CT 07/26/2013.  FINDINGS: Trace left pleural  effusion lying dependently. Within the visualized portions of the thorax there are no suspicious appearing pulmonary nodules or masses, there is no acute consolidative airspace disease, no right pleural effusion, no pneumothorax and no lymphadenopathy. Visualized portions of the upper abdomen are unremarkable. There are no aggressive appearing lytic or blastic lesions noted in the visualized portions of the skeleton. Median sternotomy wires.  IMPRESSION: 1. Trace left pleural effusion lying dependently.  Electronically Signed: By: Trudie Reed M.D. On: 10/18/2019 09:10          Risk Assessment/Calculations:   {Does this patient have ATRIAL FIBRILLATION?:[id number]}  STOP-Bang Score:  7  { Consider Dx Sleep Disordered Breathing or Sleep Apnea  ICD G47.33          :1}      Lab Results  Component Value Date   WBC 3.2 (L) 05/19/2022   HGB 11.0 (L) 05/19/2022   HCT 32.4 (L) 05/19/2022   MCV 85.5 05/19/2022   PLT 221 05/19/2022   Lab Results  Component Value Date   CREATININE 0.86 05/19/2022   BUN 11 05/19/2022   NA 136 05/19/2022   K 3.5 05/19/2022   CL 104 05/19/2022   CO2 26 05/19/2022   Lab Results  Component Value Date   ALT 13 05/19/2022  AST 16 05/19/2022   ALKPHOS 68 05/19/2022   BILITOT 1.0 05/19/2022   Lab Results  Component Value Date   CHOL 70 05/20/2022   HDL 37 (L) 05/20/2022   LDLCALC 17 05/20/2022   TRIG 82 05/20/2022   CHOLHDL 1.9 05/20/2022    Lab Results  Component Value Date   HGBA1C 5.9 (H) 05/19/2022     Assessment & Plan    1.Coronary artery disease: -History of CABG x 1 in 2002 with Lexiscan Myoview completed 05/2021 with no evidence of ischemia -Today patient reports ongoing chest pain with shortness of breath and palpitations. -Previous Lexiscan Myoview completed showing no evidence of decreased perfusion but did note reduced LV function. -We will schedule an echocardiogram for further evaluation of decreased LV  function. -He will increase his Imdur to 90 mg daily -Continue current GDMT with Plavix 75 mg daily, Lipitor 80 mg daily, ezetimibe 10 mg daily, Repatha 140 mg q. 14 days and Lopressor 50 mg daily   2.  Syncope: -Patient reported recent episode of syncope and today reports***  3.  HFpEF: -Most recent 2D echo completed 10/2021 with EF of 60-65%, no RWMA, with trivial TVR and MVR  -Most recent Lexiscan Myoview showed decreased LV function and patient will complete updated 2D echo for further valuation. -Today patient reports occasional episodes of shortness of breath and trace amount of lower extremity edema today. -Patient will continue Lasix 40 mg with additional as needed dose of 40 mg as needed for weight gain of 2 pounds 24 hours and 5 pounds in 1 week. -Low sodium diet, fluid restriction <2L, and daily weights encouraged. Educated to contact our office for weight gain of 2 lbs overnight or 5 lbs in one week.    4.  Obstructive sleep apnea: -Patient reports compliance with his CPAP machine.  5.Essential hypertension: -Patient's blood pressure today was elevated at 148/82 and was 142/84 on recheck. -He recently had a surgical procedure and is having some pain from his right wrist. -He will continue to monitor his blood pressure       Disposition: Follow-up with None or APP in *** months {Are you ordering a CV Procedure (e.g. stress test, cath, DCCV, TEE, etc)?   Press F2        :[id number]}   Medication Adjustments/Labs and Tests Ordered: Current medicines are reviewed at length with the patient today.  Concerns regarding medicines are outlined above.   Signed, Napoleon Form, Leodis Rains, NP 06/13/2022, 4:42 PM  [facility]

## 2022-06-15 ENCOUNTER — Ambulatory Visit: Payer: 59 | Admitting: Nurse Practitioner

## 2022-06-15 DIAGNOSIS — I2510 Atherosclerotic heart disease of native coronary artery without angina pectoris: Secondary | ICD-10-CM

## 2022-06-15 DIAGNOSIS — R079 Chest pain, unspecified: Secondary | ICD-10-CM

## 2022-06-15 DIAGNOSIS — R55 Syncope and collapse: Secondary | ICD-10-CM

## 2022-06-15 DIAGNOSIS — I5032 Chronic diastolic (congestive) heart failure: Secondary | ICD-10-CM

## 2022-06-15 DIAGNOSIS — I10 Essential (primary) hypertension: Secondary | ICD-10-CM

## 2022-06-15 DIAGNOSIS — G4733 Obstructive sleep apnea (adult) (pediatric): Secondary | ICD-10-CM

## 2022-06-16 ENCOUNTER — Other Ambulatory Visit (HOSPITAL_COMMUNITY): Payer: 59

## 2022-07-08 ENCOUNTER — Emergency Department (HOSPITAL_COMMUNITY)
Admission: EM | Admit: 2022-07-08 | Discharge: 2022-07-08 | Disposition: A | Discharge status: Home / Self Care | Payer: 59 | Attending: Emergency Medicine | Admitting: Emergency Medicine

## 2022-07-08 ENCOUNTER — Emergency Department (HOSPITAL_COMMUNITY): Payer: 59

## 2022-07-08 ENCOUNTER — Other Ambulatory Visit: Payer: Self-pay

## 2022-07-08 DIAGNOSIS — Z7902 Long term (current) use of antithrombotics/antiplatelets: Secondary | ICD-10-CM | POA: Insufficient documentation

## 2022-07-08 DIAGNOSIS — Z7984 Long term (current) use of oral hypoglycemic drugs: Secondary | ICD-10-CM | POA: Diagnosis not present

## 2022-07-08 DIAGNOSIS — Z79899 Other long term (current) drug therapy: Secondary | ICD-10-CM | POA: Diagnosis not present

## 2022-07-08 DIAGNOSIS — E119 Type 2 diabetes mellitus without complications: Secondary | ICD-10-CM | POA: Insufficient documentation

## 2022-07-08 DIAGNOSIS — R079 Chest pain, unspecified: Secondary | ICD-10-CM | POA: Diagnosis not present

## 2022-07-08 DIAGNOSIS — J449 Chronic obstructive pulmonary disease, unspecified: Secondary | ICD-10-CM | POA: Diagnosis not present

## 2022-07-08 DIAGNOSIS — I2510 Atherosclerotic heart disease of native coronary artery without angina pectoris: Secondary | ICD-10-CM | POA: Insufficient documentation

## 2022-07-08 DIAGNOSIS — I10 Essential (primary) hypertension: Secondary | ICD-10-CM | POA: Insufficient documentation

## 2022-07-08 DIAGNOSIS — R0602 Shortness of breath: Secondary | ICD-10-CM | POA: Diagnosis not present

## 2022-07-08 LAB — CBC WITH DIFFERENTIAL/PLATELET
Abs Immature Granulocytes: 0.02 10*3/uL (ref 0.00–0.07)
Basophils Absolute: 0 10*3/uL (ref 0.0–0.1)
Basophils Relative: 0 %
Eosinophils Absolute: 0.2 10*3/uL (ref 0.0–0.5)
Eosinophils Relative: 3 %
HCT: 31.1 % — ABNORMAL LOW (ref 39.0–52.0)
Hemoglobin: 10.5 g/dL — ABNORMAL LOW (ref 13.0–17.0)
Immature Granulocytes: 0 %
Lymphocytes Relative: 30 %
Lymphs Abs: 2 10*3/uL (ref 0.7–4.0)
MCH: 29.1 pg (ref 26.0–34.0)
MCHC: 33.8 g/dL (ref 30.0–36.0)
MCV: 86.1 fL (ref 80.0–100.0)
Monocytes Absolute: 0.4 10*3/uL (ref 0.1–1.0)
Monocytes Relative: 5 %
Neutro Abs: 4.1 10*3/uL (ref 1.7–7.7)
Neutrophils Relative %: 62 %
Platelets: 212 10*3/uL (ref 150–400)
RBC: 3.61 MIL/uL — ABNORMAL LOW (ref 4.22–5.81)
RDW: 14.2 % (ref 11.5–15.5)
WBC: 6.7 10*3/uL (ref 4.0–10.5)
nRBC: 0 % (ref 0.0–0.2)

## 2022-07-08 LAB — BASIC METABOLIC PANEL
Anion gap: 7 (ref 5–15)
BUN: 9 mg/dL (ref 6–20)
CO2: 25 mmol/L (ref 22–32)
Calcium: 9.1 mg/dL (ref 8.9–10.3)
Chloride: 105 mmol/L (ref 98–111)
Creatinine, Ser: 1.35 mg/dL — ABNORMAL HIGH (ref 0.61–1.24)
GFR, Estimated: >60 mL/min (ref >60)
Glucose, Bld: 120 mg/dL — ABNORMAL HIGH (ref 70–99)
Potassium: 4 mmol/L (ref 3.5–5.1)
Sodium: 137 mmol/L (ref 135–145)

## 2022-07-08 LAB — TROPONIN I (HIGH SENSITIVITY): Troponin I (High Sensitivity): <2 ng/L (ref <18)

## 2022-07-08 MED ORDER — ALBUTEROL SULFATE (2.5 MG/3ML) 0.083% IN NEBU: 2.5000 mg | INHALATION_SOLUTION | Freq: Four times a day (QID) | RESPIRATORY_TRACT | 12 refills | Status: AC | PRN

## 2022-07-08 MED ORDER — METHYLPREDNISOLONE SODIUM SUCC 125 MG IJ SOLR
125.0000 mg | Freq: Once | INTRAMUSCULAR | Status: AC
  Administered 2022-07-08: 125 mg via INTRAVENOUS
  Filled 2022-07-08: qty 2

## 2022-07-08 MED ORDER — PREDNISONE 20 MG PO TABS: 40.0000 mg | ORAL_TABLET | Freq: Every day | ORAL | 0 refills | Status: DC

## 2022-07-08 MED ORDER — IPRATROPIUM-ALBUTEROL 0.5-2.5 (3) MG/3ML IN SOLN
3.0000 mL | Freq: Once | RESPIRATORY_TRACT | Status: AC
  Administered 2022-07-08: 3 mL via RESPIRATORY_TRACT
  Filled 2022-07-08: qty 3

## 2022-07-08 MED ORDER — ALBUTEROL SULFATE HFA 108 (90 BASE) MCG/ACT IN AERS: 2.0000 | INHALATION_SPRAY | RESPIRATORY_TRACT | 3 refills | Status: AC | PRN

## 2022-07-08 NOTE — ED Notes (Signed)
PC to Xray to inform them that pt is ready for xray at any time.

## 2022-07-08 NOTE — Discharge Instructions (Signed)
Your testing has been unremarkable and reassuring.  There does not appear to be any signs of a heart attack however if you do develop increasing or worsening chest pain or difficulty breathing despite the medications that we have prescribed I will need for you to return to the emergency department immediately.   Prednisone is a steroid that helps to reduce certain types of inflammation and may be used for allergic reactions, some rashes such as poison ivy or dermatitis, for asthma attacks or bronchitis and for certain types of pain.  Please take this medicine exactly as prescribed - 40mg  by mouth daily for 5 days.  This can have certain side effects with some people including feeling like you can't sleep, feeling anxious or feeling like you are on a "high".  It should not cause weight gain if only taken for a short time.  Please be aware that this medication may also cause an elevation in your blood sugar if you are a diabetic so if you are a diabetic you will need to keep a very close eye on your blood sugar, make sure that you are eating an extremely low level of carbohydrates and taking your medications exactly as prescribed.  If you should develop severe high blood sugar or start to feel poorly return to the emergency department immediately   Albuterol is an inhaled medication which can help you to breathe better, you should take 2 puffs every 4 hours as needed, this may cause your heart to feel like it is racing, this should be a temporary side effect.  Thank you for allowing [place] to treat you in the emergency department today.  After reviewing your examination and potential testing that was done it appears that you are safe to go home.  I would like for you to follow-up with your doctor within the next several days, have them obtain your results and follow-up with them to review all of these tests.  If you should develop severe or worsening symptoms return to the emergency department immediately

## 2022-07-08 NOTE — ED Notes (Signed)
Pt back from Xray via stretcher.

## 2022-07-08 NOTE — ED Provider Notes (Signed)
[place] EMERGENCY DEPARTMENT AT [facility] Provider Note   CSN: [id number] Arrival date & time: 07/08/22  1935     History  Chief Complaint  Patient presents with   Chest Pain    Dalton [name] is a 62 y.o. male.   Chest Pain  Patient is a 62 year old male history of coronary disease status post triple bypass twice in the past, history of high cholesterol, hypertension, COPD currently taking multiple breathing treatments to try to help with his frequent coughing and shortness of breath.  He reports that he has been nauseated not sleeping well at night because he continues to cough and has been drinking a lot of cough syrup to try to get him through the night.  He was recently taken off of Mounjaro for his diabetes.  He states that this chest pain is central, it does not radiate and has been going on for about a week, worse with breathing and coughing, no increased swelling of the legs and no fevers or chills.  When he does have phlegm it appears clear or white    Home Medications Prior to Admission medications   Medication Sig Start Date End Date Taking? Authorizing Provider  albuterol (PROVENTIL) (2.5 MG/3ML) 0.083% nebulizer solution Take 3 mLs (2.5 mg total) by nebulization every 6 (six) hours as needed for wheezing or shortness of breath. 07/08/22  Yes Eber Hong, MD  albuterol (VENTOLIN HFA) 108 (90 Base) MCG/ACT inhaler Inhale 2 puffs into the lungs every 4 (four) hours as needed for wheezing or shortness of breath. 07/08/22  Yes Eber Hong, MD  ALPRAZolam Prudy Feeler) 1 MG tablet Take 1 mg by mouth 4 (four) times daily. 08/01/19  Yes [provider]  atorvastatin (LIPITOR) 80 MG tablet Take 80 mg by mouth at bedtime. 12/05/20  Yes [provider]  azelastine (ASTELIN) 0.1 % nasal spray Place 1 spray into both nostrils 2 (two) times daily. 12/29/21  Yes [provider]  beclomethasone (QVAR) 80 MCG/ACT inhaler Inhale 2 puffs into the lungs  2 (two) times daily as needed (respiratory issues.).    Yes [provider]  BELSOMRA 15 MG TABS Take 1 tablet by mouth at bedtime. 04/30/21  Yes [provider]  brimonidine (ALPHAGAN P) 0.1 % SOLN Place 1 drop into the right eye 2 (two) times daily. 12/25/20  Yes [provider]  cetirizine (ZYRTEC) 10 MG tablet Take 10 mg by mouth daily as needed for allergies.  01/26/16  Yes [provider]  clopidogrel (PLAVIX) 75 MG tablet Take 75 mg by mouth daily.   Yes [provider]  dexlansoprazole (DEXILANT) 60 MG capsule Take 1 capsule (60 mg total) by mouth daily. 07/05/17  Yes Fields, Sandi L, MD  esomeprazole (NEXIUM) 40 MG capsule Take 40 mg by mouth daily. 07/06/22  Yes [provider]  Evolocumab (REPATHA SURECLICK) 140 MG/ML SOAJ Take 140 mg by mouth every 14 (fourteen) days. 03/10/22  Yes BranchDorothe Pea, MD  ezetimibe (ZETIA) 10 MG tablet Take 10 mg by mouth daily.   Yes [provider]  fluticasone (FLONASE) 50 MCG/ACT nasal spray Place 2 sprays into both nostrils daily.   Yes [provider]  furosemide (LASIX) 40 MG tablet Take 1 tablet (40 mg total) by mouth daily. Can take an additional 40mg  tab as needed for swelling or weight gain 04/13/22  Yes Gaston Islam., NP  glipiZIDE (GLUCOTROL) 10 MG tablet Take 10 mg by mouth daily before breakfast.  Yes [provider]  isosorbide mononitrate (IMDUR) 60 MG 24 hr tablet Take 60 mg by mouth daily.   Yes [provider]  ketoconazole (NIZORAL) 2 % shampoo Apply 1 application topically daily as needed for irritation.  10/23/19  Yes [provider]  latanoprost (XALATAN) 0.005 % ophthalmic solution Place 1 drop into both eyes at bedtime.   Yes [provider]  LINZESS 290 MCG CAPS capsule TAKE 1 CAPSULE(290 MCG) BY MOUTH DAILY BEFORE BREAKFAST Patient taking differently: Take 290 mcg by mouth daily before breakfast. May take 1 additional  capsule as needed for constipation 02/04/21  Yes Tiffany Kocher, PA-C  lisinopril (ZESTRIL) 10 MG tablet Take 1 tablet (10 mg total) by mouth daily. 05/21/22  Yes Johnson, Clanford L, MD  magnesium citrate SOLN Take 1 Bottle by mouth as needed for moderate constipation.    Yes [provider]  metFORMIN (GLUCOPHAGE-XR) 500 MG 24 hr tablet Take 500 mg by mouth daily with breakfast.    Yes [provider]  metoprolol tartrate (LOPRESSOR) 50 MG tablet TAKE 1 TABLET(50 MG) BY MOUTH TWICE DAILY 02/04/22  Yes Lyn Records, MD  Multiple Vitamin (MULTIVITAMIN WITH MINERALS) TABS tablet Take 1 tablet by mouth daily.   Yes [provider]  NARCAN 4 MG/0.1ML LIQD nasal spray kit Place 1 spray into the nose as needed (opioid overdose).  06/15/17  Yes [provider]  nitroGLYCERIN (NITROSTAT) 0.4 MG SL tablet Place 1 tablet (0.4 mg total) under the tongue every 5 (five) minutes as needed for chest pain. 08/26/21  Yes Johnson, Clanford L, MD  oxyCODONE-acetaminophen (PERCOCET) 10-325 MG tablet Take 1 tablet by mouth every 4 (four) hours as needed for pain.   Yes [provider]  potassium chloride SA (KLOR-CON) 20 MEQ tablet Take 20 mEq by mouth 2 (two) times daily.   Yes [provider]  predniSONE (DELTASONE) 20 MG tablet Take 2 tablets (40 mg total) by mouth daily. 07/08/22  Yes Eber Hong, MD  sertraline (ZOLOFT) 100 MG tablet Take 100 mg by mouth daily.   Yes [provider]  sildenafil (VIAGRA) 100 MG tablet Take by mouth as needed for erectile dysfunction. 03/11/22  Yes [provider]  sucralfate (CARAFATE) 1 GM/10ML suspension in the morning, at noon, in the evening, and at bedtime. 09/10/20  Yes [provider]  tamsulosin (FLOMAX) 0.4 MG CAPS capsule Take 1 capsule (0.4 mg total) by mouth daily after breakfast. 01/07/16  Yes Adonis Brook, NP  TRULANCE 3 MG TABS Take 1 tablet by mouth daily.   Yes [provider]   Vitamin D, Ergocalciferol, (DRISDOL) 1.25 MG (50000 UT) CAPS capsule Take 50,000 Units by mouth every 7 (seven) days. Monday   Yes [provider]  famotidine (PEPCID) 20 MG tablet Take 20 mg by mouth at bedtime. Patient not taking: Reported on 07/08/2022 12/29/21   [provider]  MOUNJARO 2.5 MG/0.5ML Pen Inject 2.5 mg into the skin once a week. Patient not taking: Reported on 07/08/2022    [provider]      Allergies    Gabapentin, Ibuprofen, Zolpidem tartrate, and Naproxen    Review of Systems   Review of Systems  Cardiovascular:  Positive for chest pain.  All other systems reviewed and are negative.   Physical Exam Updated Vital Signs BP 107/73   Pulse 75   Temp 98.1 F (36.7 C) (Oral)   Resp 12   SpO2 97%  Physical Exam Vitals  and nursing note reviewed.  Constitutional:      General: He is not in acute distress.    Appearance: He is well-developed.  HENT:     Head: Normocephalic and atraumatic.     Mouth/Throat:     Pharynx: No oropharyngeal exudate.  Eyes:     General: No scleral icterus.       Right eye: No discharge.        Left eye: No discharge.     Conjunctiva/sclera: Conjunctivae normal.     Pupils: Pupils are equal, round, and reactive to light.  Neck:     Thyroid: No thyromegaly.     Vascular: No JVD.  Cardiovascular:     Rate and Rhythm: Normal rate and regular rhythm.     Heart sounds: Normal heart sounds. No murmur heard.    No friction rub. No gallop.  Pulmonary:     Effort: Pulmonary effort is normal. No respiratory distress.     Breath sounds: Normal breath sounds. No wheezing or rales.  Abdominal:     General: Bowel sounds are normal. There is no distension.     Palpations: Abdomen is soft. There is no mass.     Tenderness: There is no abdominal tenderness.  Musculoskeletal:        General: No tenderness. Normal range of motion.     Cervical back: Normal range of motion and neck supple.     Right lower leg: No  edema.     Left lower leg: No edema.  Lymphadenopathy:     Cervical: No cervical adenopathy.  Skin:    General: Skin is warm and dry.     Findings: No erythema or rash.  Neurological:     Mental Status: He is alert.     Coordination: Coordination normal.  Psychiatric:        Behavior: Behavior normal.     ED Results / Procedures / Treatments   Labs (all labs ordered are listed, but only abnormal results are displayed) Labs Reviewed  CBC WITH DIFFERENTIAL/PLATELET - Abnormal; Notable for the following components:      Result Value   RBC 3.61 (*)    Hemoglobin 10.5 (*)    HCT 31.1 (*)    All other components within normal limits  BASIC METABOLIC PANEL - Abnormal; Notable for the following components:   Glucose, Bld 120 (*)    Creatinine, Ser 1.35 (*)    All other components within normal limits  TROPONIN I (HIGH SENSITIVITY)  TROPONIN I (HIGH SENSITIVITY)    EKG EKG Interpretation  Date/Time:  Thursday Jul 08 2022 19:50:18 EDT Ventricular Rate:  90 PR Interval:  178 QRS Duration: 76 QT Interval:  380 QTC Calculation: 464 R Axis:   68 Text Interpretation: Sinus rhythm with Premature atrial complexes Otherwise normal ECG When compared with ECG of 18-May-2022 14:22, Premature atrial complexes are now Present ST elevation now present in Inferior leads T wave inversion no longer evident in Inferior leads Confirmed by Eber Hong ([id number]) on 07/08/2022 7:52:49 PM  Radiology DG Chest 2 View  Result Date: 07/08/2022 CLINICAL DATA:  Short of breath EXAM: CHEST - 2 VIEW COMPARISON:  05/18/2022 FINDINGS: Post sternotomy changes. No focal airspace disease or effusion. Normal cardiomediastinal silhouette. No pneumothorax. Stent superior to the aortic arch as before IMPRESSION: No active cardiopulmonary disease. Electronically Signed   By: Jasmine Pang M.D.   On: 07/08/2022 21:53    Procedures Procedures    Medications Ordered in ED Medications  ipratropium-albuterol (DUONEB)  0.5-2.5 (3) MG/3ML nebulizer solution 3 mL (3 mLs Nebulization Given 07/08/22 2035)  methylPREDNISolone sodium succinate (SOLU-MEDROL) 125 mg/2 mL injection 125 mg (125 mg Intravenous Given 07/08/22 2051)    ED Course/ Medical Decision Making/ A&P                             Medical Decision Making Amount and/or Complexity of Data Reviewed Labs: ordered. Radiology: ordered.  Risk Prescription drug management.    This patient presents to the ED for concern of chest pain and shortness of breath, this involves an extensive number of treatment options, and is a complaint that carries with it a high risk of complications and morbidity.  The differential diagnosis includes infection such as COPD exacerbation, pneumonia, pneumothorax, less likely be pulmonary embolism, pulse of 66 with an oxygen of 98% on room air with clear heart and lung sounds.  No signs of significant asymmetrical legs or edema, would also consider coronary disease but EKG is unchanged   Co morbidities that complicate the patient evaluation  Diabetes hypertension prior coronary disease   Additional history obtained:  Additional history obtained from medical record External records from outside source obtained and reviewed including notes from hospitalizations, including admission in March 2024 with what was thought to be unstable angina after syncope, echocardiogram obtained showing ejection fraction of 50 to 55% with grade 1 diastolic dysfunction, carotid ultrasounds were also obtained during that visit showing minimal bilateral less than 50% diameter stenosis, myocardial perfusion imaging performed no evidence of ischemia in February 2024   Lab Tests:  I Ordered, and personally interpreted labs.  The pertinent results include: CBC metabolic panel and troponin all unremarkable, negligible troponin   Imaging Studies ordered:  I ordered imaging studies including chest x-ray I independently visualized and interpreted  imaging which showed no acute findings I agree with the radiologist interpretation   Cardiac Monitoring: / EKG:  The patient was maintained on a cardiac monitor.  I personally viewed and interpreted the cardiac monitored which showed an underlying rhythm of: Sinus rhythm with PACs    Problem List / ED Course / Critical interventions / Medication management  Albuterol treatment given I ordered medication including albuterol for shortness of breath Reevaluation of the patient after these medicines showed that the patient resolution of symptoms I have reviewed the patients home medicines and have made adjustments as needed   Social Determinants of Health:  Patient has chronic conditions ongoing albuterol treatment at home presents with shortness of breath and chest pain not found to have any acute pathology.  He is normotensive, oxygen of 97% with a pulse of 75, EKG is unremarkable troponin is normal.  I suspect that he has COPD exacerbation, given Solu-Medrol, nebs, well-appearing   Test / Admission - Considered:  Patient agreeable to return should symptoms worsen, stable for discharge  I have discussed with the patient at the bedside the results, and the meaning of these results.  They have expressed her understanding to the need for follow-up with primary care physician         Final Clinical Impression(s) / ED Diagnoses Final diagnoses:  Shortness of breath  Chest pain, unspecified type    Rx / DC Orders ED Discharge Orders          Ordered    albuterol (VENTOLIN HFA) 108 (90 Base) MCG/ACT inhaler  Every 4 hours PRN  07/08/22 2210    predniSONE (DELTASONE) 20 MG tablet  Daily        07/08/22 2210    albuterol (PROVENTIL) (2.5 MG/3ML) 0.083% nebulizer solution  Every 6 hours PRN        07/08/22 2210              Eber Hong, MD 07/08/22 2212

## 2022-07-08 NOTE — ED Notes (Signed)
Patient transported to X-ray via stretcher 

## 2022-07-08 NOTE — ED Triage Notes (Signed)
Pt has central non radiating chest pain x 1 week.  Feels shob.  "Sore all over"  Nausea, insomnia.  States doctor took him off [place].  He has lost sleep he states because of coughing. Reports "drinking a bottle of cough syrup every night this week"

## 2022-07-24 ENCOUNTER — Emergency Department (HOSPITAL_COMMUNITY): Payer: 59

## 2022-07-24 ENCOUNTER — Emergency Department (HOSPITAL_COMMUNITY)
Admission: EM | Admit: 2022-07-24 | Discharge: 2022-07-24 | Disposition: A | Discharge status: Home / Self Care | Payer: 59 | Attending: Emergency Medicine | Admitting: Emergency Medicine

## 2022-07-24 ENCOUNTER — Other Ambulatory Visit: Payer: Self-pay

## 2022-07-24 DIAGNOSIS — J45909 Unspecified asthma, uncomplicated: Secondary | ICD-10-CM | POA: Insufficient documentation

## 2022-07-24 DIAGNOSIS — R079 Chest pain, unspecified: Secondary | ICD-10-CM | POA: Insufficient documentation

## 2022-07-24 DIAGNOSIS — Z7951 Long term (current) use of inhaled steroids: Secondary | ICD-10-CM | POA: Insufficient documentation

## 2022-07-24 DIAGNOSIS — I110 Hypertensive heart disease with heart failure: Secondary | ICD-10-CM | POA: Insufficient documentation

## 2022-07-24 DIAGNOSIS — Z79899 Other long term (current) drug therapy: Secondary | ICD-10-CM | POA: Insufficient documentation

## 2022-07-24 DIAGNOSIS — E876 Hypokalemia: Secondary | ICD-10-CM | POA: Diagnosis not present

## 2022-07-24 DIAGNOSIS — Z7902 Long term (current) use of antithrombotics/antiplatelets: Secondary | ICD-10-CM | POA: Diagnosis not present

## 2022-07-24 DIAGNOSIS — E119 Type 2 diabetes mellitus without complications: Secondary | ICD-10-CM | POA: Insufficient documentation

## 2022-07-24 DIAGNOSIS — J449 Chronic obstructive pulmonary disease, unspecified: Secondary | ICD-10-CM | POA: Diagnosis not present

## 2022-07-24 DIAGNOSIS — R0602 Shortness of breath: Secondary | ICD-10-CM | POA: Insufficient documentation

## 2022-07-24 DIAGNOSIS — I509 Heart failure, unspecified: Secondary | ICD-10-CM | POA: Diagnosis not present

## 2022-07-24 DIAGNOSIS — Z7984 Long term (current) use of oral hypoglycemic drugs: Secondary | ICD-10-CM | POA: Insufficient documentation

## 2022-07-24 DIAGNOSIS — R600 Localized edema: Secondary | ICD-10-CM | POA: Insufficient documentation

## 2022-07-24 DIAGNOSIS — D649 Anemia, unspecified: Secondary | ICD-10-CM | POA: Insufficient documentation

## 2022-07-24 LAB — CBC WITH DIFFERENTIAL/PLATELET
Abs Immature Granulocytes: 0.01 10*3/uL (ref 0.00–0.07)
Basophils Absolute: 0 10*3/uL (ref 0.0–0.1)
Basophils Relative: 0 %
Eosinophils Absolute: 0 10*3/uL (ref 0.0–0.5)
Eosinophils Relative: 1 %
HCT: 32.4 % — ABNORMAL LOW (ref 39.0–52.0)
Hemoglobin: 11.5 g/dL — ABNORMAL LOW (ref 13.0–17.0)
Immature Granulocytes: 0 %
Lymphocytes Relative: 22 %
Lymphs Abs: 0.9 10*3/uL (ref 0.7–4.0)
MCH: 29.5 pg (ref 26.0–34.0)
MCHC: 35.5 g/dL (ref 30.0–36.0)
MCV: 83.1 fL (ref 80.0–100.0)
Monocytes Absolute: 0.3 10*3/uL (ref 0.1–1.0)
Monocytes Relative: 8 %
Neutro Abs: 2.7 10*3/uL (ref 1.7–7.7)
Neutrophils Relative %: 69 %
Platelets: 190 10*3/uL (ref 150–400)
RBC: 3.9 MIL/uL — ABNORMAL LOW (ref 4.22–5.81)
RDW: 13.5 % (ref 11.5–15.5)
WBC: 4 10*3/uL (ref 4.0–10.5)
nRBC: 0 % (ref 0.0–0.2)

## 2022-07-24 LAB — COMPREHENSIVE METABOLIC PANEL
ALT: 37 U/L (ref 0–44)
AST: 45 U/L — ABNORMAL HIGH (ref 15–41)
Albumin: 3.4 g/dL — ABNORMAL LOW (ref 3.5–5.0)
Alkaline Phosphatase: 110 U/L (ref 38–126)
Anion gap: 13 (ref 5–15)
BUN: 10 mg/dL (ref 8–23)
CO2: 27 mmol/L (ref 22–32)
Calcium: 8.7 mg/dL — ABNORMAL LOW (ref 8.9–10.3)
Chloride: 95 mmol/L — ABNORMAL LOW (ref 98–111)
Creatinine, Ser: 0.98 mg/dL (ref 0.61–1.24)
GFR, Estimated: >60 mL/min (ref >60)
Glucose, Bld: 109 mg/dL — ABNORMAL HIGH (ref 70–99)
Potassium: 2.8 mmol/L — ABNORMAL LOW (ref 3.5–5.1)
Sodium: 135 mmol/L (ref 135–145)
Total Bilirubin: 0.9 mg/dL (ref 0.3–1.2)
Total Protein: 7 g/dL (ref 6.5–8.1)

## 2022-07-24 LAB — TROPONIN I (HIGH SENSITIVITY): Troponin I (High Sensitivity): 4 ng/L (ref <18)

## 2022-07-24 LAB — BRAIN NATRIURETIC PEPTIDE: B Natriuretic Peptide: 19 pg/mL (ref 0.0–100.0)

## 2022-07-24 MED ORDER — POTASSIUM CHLORIDE CRYS ER 20 MEQ PO TBCR
40.0000 meq | EXTENDED_RELEASE_TABLET | Freq: Once | ORAL | Status: AC
  Administered 2022-07-24: 40 meq via ORAL
  Filled 2022-07-24: qty 2

## 2022-07-24 MED ORDER — IPRATROPIUM-ALBUTEROL 0.5-2.5 (3) MG/3ML IN SOLN
3.0000 mL | Freq: Once | RESPIRATORY_TRACT | Status: AC
  Administered 2022-07-24: 3 mL via RESPIRATORY_TRACT
  Filled 2022-07-24: qty 3

## 2022-07-24 MED ORDER — PREDNISONE 50 MG PO TABS
60.0000 mg | ORAL_TABLET | Freq: Once | ORAL | Status: AC
  Administered 2022-07-24: 60 mg via ORAL
  Filled 2022-07-24: qty 1

## 2022-07-24 MED ORDER — PREDNISONE 10 MG PO TABS: 60.0000 mg | ORAL_TABLET | Freq: Every day | ORAL | 0 refills | Status: AC

## 2022-07-24 NOTE — ED Provider Notes (Signed)
[name] EMERGENCY DEPARTMENT AT [facility] Provider Note   CSN: [id number] Arrival date & time: 07/24/22  1619     History Chief Complaint  Patient presents with   Chest Pain   Shortness of Breath    Dalton [name] is a 62 y.o. male.  Patient with past history significant for hypertension, MI, asthma, congestive heart failure, DVT, COPD who presents to the emergency department complaints of chest pain shortness of breath. States that he has been experiencing worsening SOB particularly with ambulation. Has not seen cardiology recently per his report. He feels that his symptoms have worsened since he was taken off of Mounjaro. Denies any significant worsening of swelling his legs.    Chest Pain Associated symptoms: shortness of breath   Shortness of Breath Associated symptoms: chest pain        Home Medications Prior to Admission medications   Medication Sig Start Date End Date Taking? Authorizing Provider  albuterol (PROVENTIL) (2.5 MG/3ML) 0.083% nebulizer solution Take 3 mLs (2.5 mg total) by nebulization every 6 (six) hours as needed for wheezing or shortness of breath. 07/08/22  Yes Eber Hong, MD  albuterol (VENTOLIN HFA) 108 (90 Base) MCG/ACT inhaler Inhale 2 puffs into the lungs every 4 (four) hours as needed for wheezing or shortness of breath. 07/08/22  Yes Eber Hong, MD  ALPRAZolam Prudy Feeler) 1 MG tablet Take 1 mg by mouth 4 (four) times daily. 08/01/19  Yes [provider]  atorvastatin (LIPITOR) 80 MG tablet Take 80 mg by mouth at bedtime. 12/05/20  Yes [provider]  azelastine (ASTELIN) 0.1 % nasal spray Place 1 spray into both nostrils 2 (two) times daily. 12/29/21  Yes [provider]  beclomethasone (QVAR) 80 MCG/ACT inhaler Inhale 2 puffs into the lungs 2 (two) times daily as needed (respiratory issues.).    Yes [provider]  BELSOMRA 15 MG TABS Take 1 tablet by mouth at bedtime. 04/30/21  Yes [provider]  brimonidine (ALPHAGAN P) 0.1 % SOLN Place 1 drop into the right eye 2 (two) times daily. 12/25/20  Yes [provider]  cetirizine (ZYRTEC) 10 MG tablet Take 10 mg by mouth daily as needed for allergies.  01/26/16  Yes [provider]  clopidogrel (PLAVIX) 75 MG tablet Take 75 mg by mouth daily.   Yes [provider]  dexlansoprazole (DEXILANT) 60 MG capsule Take 1 capsule (60 mg total) by mouth daily. 07/05/17  Yes Fields, Sandi L, MD  esomeprazole (NEXIUM) 40 MG capsule Take 40 mg by mouth daily. 07/06/22  Yes [provider]  Evolocumab (REPATHA SURECLICK) 140 MG/ML SOAJ Take 140 mg by mouth every 14 (fourteen) days. 03/10/22  Yes BranchDorothe Pea, MD  ezetimibe (ZETIA) 10 MG tablet Take 10 mg by mouth daily.   Yes [provider]  fluticasone (FLONASE) 50 MCG/ACT nasal spray Place 2 sprays into both nostrils daily.   Yes [provider]  furosemide (LASIX) 40 MG tablet Take 1 tablet (40 mg total) by mouth daily. Can take an additional 40mg  tab as needed for swelling or weight gain 04/13/22  Yes Gaston Islam., NP  glipiZIDE (GLUCOTROL) 10 MG tablet Take 10 mg by mouth daily before breakfast.   Yes [provider]  isosorbide mononitrate (IMDUR) 60 MG 24 hr tablet Take 60 mg by mouth daily.   Yes [provider]  latanoprost (XALATAN) 0.005 % ophthalmic solution Place 1 drop into both eyes at bedtime.  Yes [provider]  LINZESS 290 MCG CAPS capsule TAKE 1 CAPSULE(290 MCG) BY MOUTH DAILY BEFORE BREAKFAST Patient taking differently: Take 290 mcg by mouth daily before breakfast. May take 1 additional capsule as needed for constipation 02/04/21  Yes Tiffany Kocher, PA-C  lisinopril (ZESTRIL) 10 MG tablet Take 1 tablet (10 mg total) by mouth daily. 05/21/22  Yes Johnson, Clanford L, MD  magnesium citrate SOLN Take 1 Bottle by mouth as needed for moderate constipation.    Yes [provider]   metFORMIN (GLUCOPHAGE-XR) 500 MG 24 hr tablet Take 500 mg by mouth daily with breakfast.    Yes [provider]  metoprolol tartrate (LOPRESSOR) 50 MG tablet TAKE 1 TABLET(50 MG) BY MOUTH TWICE DAILY 02/04/22  Yes Lyn Records, MD  MOVANTIK 25 MG TABS tablet Take 25 mg by mouth every morning. 07/09/22  Yes [provider]  Multiple Vitamin (MULTIVITAMIN WITH MINERALS) TABS tablet Take 1 tablet by mouth daily.   Yes [provider]  NARCAN 4 MG/0.1ML LIQD nasal spray kit Place 1 spray into the nose as needed (opioid overdose).  06/15/17  Yes [provider]  nitroGLYCERIN (NITROSTAT) 0.4 MG SL tablet Place 1 tablet (0.4 mg total) under the tongue every 5 (five) minutes as needed for chest pain. 08/26/21  Yes Johnson, Clanford L, MD  omeprazole (PRILOSEC) 20 MG capsule Take 20 mg by mouth 2 (two) times daily as needed. 07/21/22  Yes [provider]  oxyCODONE-acetaminophen (PERCOCET) 10-325 MG tablet Take 1 tablet by mouth every 4 (four) hours as needed for pain.   Yes [provider]  potassium chloride SA (KLOR-CON) 20 MEQ tablet Take 20 mEq by mouth 2 (two) times daily.   Yes [provider]  predniSONE (DELTASONE) 10 MG tablet Take 6 tablets (60 mg total) by mouth daily for 5 days. 07/24/22 07/29/22 Yes Smitty Knudsen, PA-C  sertraline (ZOLOFT) 100 MG tablet Take 100 mg by mouth daily.   Yes [provider]  sildenafil (VIAGRA) 100 MG tablet Take by mouth as needed for erectile dysfunction. 03/11/22  Yes [provider]  sucralfate (CARAFATE) 1 GM/10ML suspension in the morning, at noon, in the evening, and at bedtime. 09/10/20  Yes [provider]  tamsulosin (FLOMAX) 0.4 MG CAPS capsule Take 1 capsule (0.4 mg total) by mouth daily after breakfast. 01/07/16  Yes Adonis Brook, NP  TRULANCE 3 MG TABS Take 1 tablet by mouth daily.   Yes [provider]  Vitamin D, Ergocalciferol, (DRISDOL) 1.25 MG (50000  UT) CAPS capsule Take 50,000 Units by mouth every 7 (seven) days. Monday   Yes [provider]  ketoconazole (NIZORAL) 2 % shampoo Apply 1 application topically daily as needed for irritation.  10/23/19   [provider]  lisinopril-hydrochlorothiazide (ZESTORETIC) 10-12.5 MG tablet Take 1 tablet by mouth daily. Patient not taking: Reported on 07/24/2022    [provider]  MOUNJARO 2.5 MG/0.5ML Pen Inject 2.5 mg into the skin once a week. Patient not taking: Reported on 07/08/2022    [provider]  predniSONE (DELTASONE) 20 MG tablet Take 2 tablets (40 mg total) by mouth daily. Patient not taking: Reported on 07/24/2022 07/08/22   Eber Hong, MD      Allergies    Gabapentin, Ibuprofen, Zolpidem tartrate, and Naproxen    Review of Systems   Review of Systems  Respiratory:  Positive for shortness of breath.   Cardiovascular:  Positive for chest pain.  All  other systems reviewed and are negative.   Physical Exam Updated Vital Signs BP 122/73   Pulse 88   Temp 97.8 F (36.6 C) (Oral)   Resp (!) 25   SpO2 100%  Physical Exam Vitals and nursing note reviewed.  Constitutional:      General: He is not in acute distress.    Appearance: He is well-developed.  HENT:     Head: Normocephalic and atraumatic.  Eyes:     Conjunctiva/sclera: Conjunctivae normal.  Cardiovascular:     Rate and Rhythm: Normal rate and regular rhythm.     Heart sounds: Heart sounds are distant.  Pulmonary:     Effort: Pulmonary effort is normal. No tachypnea or respiratory distress.     Breath sounds: Normal breath sounds.  Abdominal:     Palpations: Abdomen is soft.     Tenderness: There is no abdominal tenderness.  Musculoskeletal:        General: No swelling. Normal range of motion.     Cervical back: Neck supple.     Right lower leg: 1+ Edema present.     Left lower leg: 1+ Edema present.  Skin:    General: Skin is warm and dry.     Capillary Refill: Capillary  refill takes less than 2 seconds.  Neurological:     Mental Status: He is alert.  Psychiatric:        Mood and Affect: Mood normal.     ED Results / Procedures / Treatments   Labs (all labs ordered are listed, but only abnormal results are displayed) Labs Reviewed  COMPREHENSIVE METABOLIC PANEL - Abnormal; Notable for the following components:      Result Value   Potassium 2.8 (*)    Chloride 95 (*)    Glucose, Bld 109 (*)    Calcium 8.7 (*)    Albumin 3.4 (*)    AST 45 (*)    All other components within normal limits  CBC WITH DIFFERENTIAL/PLATELET - Abnormal; Notable for the following components:   RBC 3.90 (*)    Hemoglobin 11.5 (*)    HCT 32.4 (*)    All other components within normal limits  BRAIN NATRIURETIC PEPTIDE  TROPONIN I (HIGH SENSITIVITY)    EKG None  Radiology DG Chest Port 1 View  Result Date: 07/24/2022 CLINICAL DATA:  Shortness of breath on exertion, chest pain and coughing. EXAM: PORTABLE CHEST 1 VIEW COMPARISON:  07/08/2022. FINDINGS: Cardiac silhouette normal in size and configuration. Normal mediastinal and hilar contours. Previous median sternotomy. Left vascular stent lies just above the aortic knob, stable from the prior radiograph. Clear lungs.  No pleural effusion or pneumothorax. Skeletal structures are grossly intact. IMPRESSION: No active disease. Electronically Signed   By: Amie Portland M.D.   On: 07/24/2022 17:37    Procedures Procedures   Medications Ordered in ED Medications  potassium chloride SA (KLOR-CON M) CR tablet 40 mEq (has no administration in time range)  predniSONE (DELTASONE) tablet 60 mg (has no administration in time range)  ipratropium-albuterol (DUONEB) 0.5-2.5 (3) MG/3ML nebulizer solution 3 mL (3 mLs Nebulization Given 07/24/22 1823)    ED Course/ Medical Decision Making/ A&P                           Medical Decision Making Amount and/or Complexity of Data Reviewed Labs: ordered. Radiology: ordered.   This  patient presents to the ED for concern of chest pain, shortness of breath.  Differential diagnosis includes ACS, CHF exacerbation, viral URI, gastroenteritis   Lab Tests:  I Ordered, and personally interpreted labs.  The pertinent results include: CBC with anemia noted, CMP with mild hypokalemia, BNP normal, troponin negative   Imaging Studies ordered:  I ordered imaging studies including chest x-ray I independently visualized and interpreted imaging which showed no acute cardiopulmonary process I agree with the radiologist interpretation   Medicines ordered and prescription drug management:  I ordered medication including DuoNeb, prednisone, Klor-Con for shortness of breath, suspected COPD exacerbation, hypokalemia Reevaluation of the patient after these medicines showed that the patient improved I have reviewed the patients home medicines and have made adjustments as needed   Problem List / ED Course:  Patient presented to the emergency department complaints of chest pain and shortness of breath.  Past history significant for hypertension, GERD, type 2 diabetes mellitus, hyperlipidemia, congestive heart failure.  Given patient's significant morbidities, lab workup initiated with CBC, CMP, troponin, BNP.  Majority labs were largely reassuring and troponin and BNP were negative for any acute exacerbations of symptoms.  Chest x-ray ordered which did not show any signs of any active cardiopulmonary process to account for patient's symptoms.  Patient is currently on Plavix making the likelihood of a pulmonary embolism unlikely.  Advised patient of findings. After discussion with patient, currently feel that he would not benefit further from admission at this time and instead outpatient treatment with cardiology follow up would be preferred given progressive worsening of SOB, particularly with exertion as this likely indicates a progression in CHF classification. Encouraged patient to return to  the ED for further evaluation if symptoms are acutely worsening. Patient is agreeable with treatment plan and verbalized understanding all return precautions. All questions answered prior to patient discharge.  Final Clinical Impression(s) / ED Diagnoses Final diagnoses:  Shortness of breath  Chest pain, unspecified type    Rx / DC Orders ED Discharge Orders          Ordered    predniSONE (DELTASONE) 10 MG tablet  Daily        07/24/22 1901    Ambulatory referral to Cardiology       Comments: If you have not heard from the Cardiology office within the next 72 hours please call [phone].   07/24/22 1905              Smitty Knudsen, PA-C 07/24/22 Chester Holstein, MD 07/24/22 [phone]

## 2022-07-24 NOTE — ED Triage Notes (Signed)
Pt presents from home with increasing shob with exertion, chest pain with coughing that he describes as "burning"  pt reports he cannot play with his grandchildren without becoming shob.  Reports hx of bronchitis and also CHF.  Has not noted any increased swelling.

## 2022-07-24 NOTE — Discharge Instructions (Addendum)
You were seen in the ED for shortness of breath and chest pain. I suspected you have been experiencing a COPD exacerbation which would benefit from a course of steroids to settle symptoms down. I would also encourage you continue to use albuterol as needed for symptom control. If symptoms continue to worsen, please return to the ED.

## 2022-08-03 ENCOUNTER — Other Ambulatory Visit: Payer: Self-pay | Admitting: Nurse Practitioner

## 2022-08-20 ENCOUNTER — Emergency Department (HOSPITAL_COMMUNITY): Payer: 59

## 2022-08-20 ENCOUNTER — Emergency Department (HOSPITAL_COMMUNITY)
Admission: EM | Admit: 2022-08-20 | Discharge: 2022-08-20 | Disposition: A | Discharge status: Home / Self Care | Payer: 59 | Source: Home / Self Care | Attending: Emergency Medicine | Admitting: Emergency Medicine

## 2022-08-20 ENCOUNTER — Encounter (HOSPITAL_COMMUNITY): Payer: Self-pay

## 2022-08-20 ENCOUNTER — Other Ambulatory Visit: Payer: Self-pay

## 2022-08-20 ENCOUNTER — Emergency Department (HOSPITAL_COMMUNITY)
Admission: EM | Admit: 2022-08-20 | Discharge: 2022-08-20 | Discharge status: Against Medical Advice | Payer: 59 | Source: Home / Self Care | Attending: Emergency Medicine | Admitting: Emergency Medicine

## 2022-08-20 ENCOUNTER — Encounter (HOSPITAL_COMMUNITY): Payer: Self-pay | Admitting: Emergency Medicine

## 2022-08-20 DIAGNOSIS — R600 Localized edema: Secondary | ICD-10-CM | POA: Insufficient documentation

## 2022-08-20 DIAGNOSIS — N12 Tubulo-interstitial nephritis, not specified as acute or chronic: Secondary | ICD-10-CM | POA: Insufficient documentation

## 2022-08-20 DIAGNOSIS — E119 Type 2 diabetes mellitus without complications: Secondary | ICD-10-CM | POA: Insufficient documentation

## 2022-08-20 DIAGNOSIS — J449 Chronic obstructive pulmonary disease, unspecified: Secondary | ICD-10-CM | POA: Insufficient documentation

## 2022-08-20 DIAGNOSIS — N10 Acute tubulo-interstitial nephritis: Secondary | ICD-10-CM | POA: Diagnosis not present

## 2022-08-20 DIAGNOSIS — R0602 Shortness of breath: Secondary | ICD-10-CM | POA: Insufficient documentation

## 2022-08-20 DIAGNOSIS — K219 Gastro-esophageal reflux disease without esophagitis: Secondary | ICD-10-CM | POA: Insufficient documentation

## 2022-08-20 DIAGNOSIS — R1010 Upper abdominal pain, unspecified: Secondary | ICD-10-CM

## 2022-08-20 DIAGNOSIS — I110 Hypertensive heart disease with heart failure: Secondary | ICD-10-CM | POA: Insufficient documentation

## 2022-08-20 DIAGNOSIS — Z5321 Procedure and treatment not carried out due to patient leaving prior to being seen by health care provider: Secondary | ICD-10-CM | POA: Insufficient documentation

## 2022-08-20 DIAGNOSIS — R7881 Bacteremia: Secondary | ICD-10-CM | POA: Diagnosis not present

## 2022-08-20 DIAGNOSIS — Z794 Long term (current) use of insulin: Secondary | ICD-10-CM | POA: Insufficient documentation

## 2022-08-20 DIAGNOSIS — I509 Heart failure, unspecified: Secondary | ICD-10-CM | POA: Insufficient documentation

## 2022-08-20 LAB — CBC WITH DIFFERENTIAL/PLATELET
Abs Immature Granulocytes: 0.03 10*3/uL (ref 0.00–0.07)
Basophils Absolute: 0 10*3/uL (ref 0.0–0.1)
Basophils Relative: 0 %
Eosinophils Absolute: 0 10*3/uL (ref 0.0–0.5)
Eosinophils Relative: 0 %
HCT: 35.2 % — ABNORMAL LOW (ref 39.0–52.0)
Hemoglobin: 12.1 g/dL — ABNORMAL LOW (ref 13.0–17.0)
Immature Granulocytes: 0 %
Lymphocytes Relative: 11 %
Lymphs Abs: 1.1 10*3/uL (ref 0.7–4.0)
MCH: 29.4 pg (ref 26.0–34.0)
MCHC: 34.4 g/dL (ref 30.0–36.0)
MCV: 85.6 fL (ref 80.0–100.0)
Monocytes Absolute: 0.6 10*3/uL (ref 0.1–1.0)
Monocytes Relative: 6 %
Neutro Abs: 8.4 10*3/uL — ABNORMAL HIGH (ref 1.7–7.7)
Neutrophils Relative %: 83 %
Platelets: 213 10*3/uL (ref 150–400)
RBC: 4.11 MIL/uL — ABNORMAL LOW (ref 4.22–5.81)
RDW: 14.5 % (ref 11.5–15.5)
WBC: 10.2 10*3/uL (ref 4.0–10.5)
nRBC: 0 % (ref 0.0–0.2)

## 2022-08-20 LAB — COMPREHENSIVE METABOLIC PANEL
ALT: 23 U/L (ref 0–44)
AST: 31 U/L (ref 15–41)
Albumin: 3.8 g/dL (ref 3.5–5.0)
Alkaline Phosphatase: 78 U/L (ref 38–126)
Anion gap: 11 (ref 5–15)
BUN: 9 mg/dL (ref 8–23)
CO2: 27 mmol/L (ref 22–32)
Calcium: 9.1 mg/dL (ref 8.9–10.3)
Chloride: 93 mmol/L — ABNORMAL LOW (ref 98–111)
Creatinine, Ser: 1.17 mg/dL (ref 0.61–1.24)
GFR, Estimated: >60 mL/min (ref >60)
Glucose, Bld: 154 mg/dL — ABNORMAL HIGH (ref 70–99)
Potassium: 3 mmol/L — ABNORMAL LOW (ref 3.5–5.1)
Sodium: 131 mmol/L — ABNORMAL LOW (ref 135–145)
Total Bilirubin: 1.6 mg/dL — ABNORMAL HIGH (ref 0.3–1.2)
Total Protein: 7.9 g/dL (ref 6.5–8.1)

## 2022-08-20 LAB — URINALYSIS, ROUTINE W REFLEX MICROSCOPIC
Bilirubin Urine: NEGATIVE
Glucose, UA: NEGATIVE mg/dL
Ketones, ur: NEGATIVE mg/dL
Nitrite: NEGATIVE
Protein, ur: NEGATIVE mg/dL
Specific Gravity, Urine: 1.02 (ref 1.005–1.030)
pH: 6 (ref 5.0–8.0)

## 2022-08-20 LAB — LACTIC ACID, PLASMA: Lactic Acid, Venous: 1.6 mmol/L (ref 0.5–1.9)

## 2022-08-20 LAB — TROPONIN I (HIGH SENSITIVITY): Troponin I (High Sensitivity): 5 ng/L (ref <18)

## 2022-08-20 LAB — CULTURE, BLOOD (ROUTINE X 2)

## 2022-08-20 MED ORDER — CEPHALEXIN 500 MG PO CAPS: 500.0000 mg | ORAL_CAPSULE | Freq: Four times a day (QID) | ORAL | 0 refills | Status: DC

## 2022-08-20 MED ORDER — SODIUM CHLORIDE 0.9 % IV SOLN
1.0000 g | Freq: Once | INTRAVENOUS | Status: AC
  Administered 2022-08-20: 1 g via INTRAVENOUS
  Filled 2022-08-20: qty 10

## 2022-08-20 MED ORDER — IOHEXOL 300 MG/ML  SOLN
100.0000 mL | Freq: Once | INTRAMUSCULAR | Status: AC | PRN
  Administered 2022-08-20: 100 mL via INTRAVENOUS

## 2022-08-20 MED ORDER — ALBUTEROL SULFATE HFA 108 (90 BASE) MCG/ACT IN AERS: 2.0000 | INHALATION_SPRAY | RESPIRATORY_TRACT | Status: DC | PRN

## 2022-08-20 NOTE — ED Provider Notes (Signed)
[place] EMERGENCY DEPARTMENT AT [facility] Provider Note   CSN: [id number] Arrival date & time: 08/20/22  1816     History {Add pertinent medical, surgical, social history, OB history to HPI:1} No chief complaint on file.   SANFORD VANVLECK is a 62 y.o. male.  Patient is presenting back to the emergency department after being here earlier this afternoon for evaluation.  He ultimately eloped after triage.  We called him and he is come back for further evaluation.  He is complaining of upper abdominal burning pain radiating to his chest that is been going on for a while.  He is following with a GI doctor at [facility], had an EGD done 2 weeks ago that was aborted due to food residue in the stomach.  He saw his GI doctor 2 days ago and was to get an upper GI yesterday to evaluate for gastric outlet obstruction.  He said his symptoms continued today of burning into his chest that is causing him to be short of breath.  He said he has had a cough although just productive of some sputum.  When he presented earlier today he had an elevated temperature and lab work was done, chest x-ray showing some possible atelectasis versus infiltrate and possibly some air in the esophagus.  He also endorses some chronic diarrhea and some burning with urination.  The history is provided by the patient.  Abdominal Pain Pain location:  Epigastric Pain quality: burning   Pain radiates to:  Chest Pain severity:  Moderate Onset quality:  Gradual Timing:  Constant Progression:  Unchanged Chronicity:  Recurrent Context: not trauma   Relieved by:  Nothing Worsened by:  Nothing Ineffective treatments: prescription. Associated symptoms: chest pain, cough, diarrhea, dysuria, fever, nausea and shortness of breath   Associated symptoms: no vomiting        Home Medications Prior to Admission medications   Medication Sig Start Date End Date Taking? Authorizing Provider  albuterol (PROVENTIL) (2.5  MG/3ML) 0.083% nebulizer solution Take 3 mLs (2.5 mg total) by nebulization every 6 (six) hours as needed for wheezing or shortness of breath. 07/08/22   Eber Hong, MD  albuterol (VENTOLIN HFA) 108 (90 Base) MCG/ACT inhaler Inhale 2 puffs into the lungs every 4 (four) hours as needed for wheezing or shortness of breath. 07/08/22   Eber Hong, MD  ALPRAZolam Prudy Feeler) 1 MG tablet Take 1 mg by mouth 4 (four) times daily. 08/01/19   [provider]  atorvastatin (LIPITOR) 80 MG tablet Take 80 mg by mouth at bedtime. 12/05/20   [provider]  azelastine (ASTELIN) 0.1 % nasal spray Place 1 spray into both nostrils 2 (two) times daily. 12/29/21   [provider]  beclomethasone (QVAR) 80 MCG/ACT inhaler Inhale 2 puffs into the lungs 2 (two) times daily as needed (respiratory issues.).     [provider]  BELSOMRA 15 MG TABS Take 1 tablet by mouth at bedtime. 04/30/21   [provider]  brimonidine (ALPHAGAN P) 0.1 % SOLN Place 1 drop into the right eye 2 (two) times daily. 12/25/20   [provider]  cetirizine (ZYRTEC) 10 MG tablet Take 10 mg by mouth daily as needed for allergies.  01/26/16   [provider]  clopidogrel (PLAVIX) 75 MG tablet Take 75 mg by mouth daily.    [provider]  dexlansoprazole (DEXILANT) 60 MG capsule Take 1 capsule (60 mg total) by mouth daily. 07/05/17   West Bali, MD  esomeprazole (NEXIUM) 40 MG capsule Take 40 mg by mouth daily. 07/06/22   [provider]  Evolocumab (REPATHA SURECLICK) 140 MG/ML SOAJ Take 140 mg by mouth every 14 (fourteen) days. 03/10/22   Antoine Poche, MD  ezetimibe (ZETIA) 10 MG tablet Take 10 mg by mouth daily.    [provider]  fluticasone (FLONASE) 50 MCG/ACT nasal spray Place 2 sprays into both nostrils daily.    [provider]  furosemide (LASIX) 40 MG tablet TAKE 1 TABLET BY MOUTH EVERY DAY AND MAY TAKE ADDITIONAL TABLET AS NEEDED FOR  SWELLING OR WEIGHT GAIN 08/03/22   Gaston Islam., NP  glipiZIDE (GLUCOTROL) 10 MG tablet Take 10 mg by mouth daily before breakfast.    [provider]  isosorbide mononitrate (IMDUR) 60 MG 24 hr tablet Take 60 mg by mouth daily.    [provider]  ketoconazole (NIZORAL) 2 % shampoo Apply 1 application topically daily as needed for irritation.  10/23/19   [provider]  latanoprost (XALATAN) 0.005 % ophthalmic solution Place 1 drop into both eyes at bedtime.    [provider]  LINZESS 290 MCG CAPS capsule TAKE 1 CAPSULE(290 MCG) BY MOUTH DAILY BEFORE BREAKFAST Patient taking differently: Take 290 mcg by mouth daily before breakfast. May take 1 additional capsule as needed for constipation 02/04/21   Tiffany Kocher, PA-C  lisinopril (ZESTRIL) 10 MG tablet Take 1 tablet (10 mg total) by mouth daily. 05/21/22   Johnson, Clanford L, MD  lisinopril-hydrochlorothiazide (ZESTORETIC) 10-12.5 MG tablet Take 1 tablet by mouth daily. Patient not taking: Reported on 07/24/2022    [provider]  magnesium citrate SOLN Take 1 Bottle by mouth as needed for moderate constipation.     [provider]  metFORMIN (GLUCOPHAGE-XR) 500 MG 24 hr tablet Take 500 mg by mouth daily with breakfast.     [provider]  metoprolol tartrate (LOPRESSOR) 50 MG tablet TAKE 1 TABLET(50 MG) BY MOUTH TWICE DAILY 02/04/22   Lyn Records, MD  MOUNJARO 2.5 MG/0.5ML Pen Inject 2.5 mg into the skin once a week. Patient not taking: Reported on 07/08/2022    [provider]  MOVANTIK 25 MG TABS tablet Take 25 mg by mouth every morning. 07/09/22   [provider]  Multiple Vitamin (MULTIVITAMIN WITH MINERALS) TABS tablet Take 1 tablet by mouth daily.    [provider]  NARCAN 4 MG/0.1ML LIQD nasal spray kit Place 1 spray into the nose as needed (opioid overdose).  06/15/17   [provider]  nitroGLYCERIN (NITROSTAT) 0.4 MG SL tablet  Place 1 tablet (0.4 mg total) under the tongue every 5 (five) minutes as needed for chest pain. 08/26/21   Johnson, Clanford L, MD  omeprazole (PRILOSEC) 20 MG capsule Take 20 mg by mouth 2 (two) times daily as needed. 07/21/22   [provider]  oxyCODONE-acetaminophen (PERCOCET) 10-325 MG tablet Take 1 tablet by mouth every 4 (four) hours as needed for pain.    [provider]  potassium chloride SA (KLOR-CON) 20 MEQ tablet Take 20 mEq by mouth 2 (two) times daily.    [provider]  predniSONE (DELTASONE) 20 MG tablet Take 2 tablets (40 mg total) by mouth daily. Patient not taking: Reported on 07/24/2022 07/08/22   Eber Hong, MD  sertraline (ZOLOFT) 100 MG tablet Take 100 mg by mouth daily.    [provider]  sildenafil (VIAGRA) 100 MG tablet Take by mouth as needed  for erectile dysfunction. 03/11/22   [provider]  sucralfate (CARAFATE) 1 GM/10ML suspension in the morning, at noon, in the evening, and at bedtime. 09/10/20   [provider]  tamsulosin (FLOMAX) 0.4 MG CAPS capsule Take 1 capsule (0.4 mg total) by mouth daily after breakfast. 01/07/16   Adonis Brook, NP  TRULANCE 3 MG TABS Take 1 tablet by mouth daily.    [provider]  Vitamin D, Ergocalciferol, (DRISDOL) 1.25 MG (50000 UT) CAPS capsule Take 50,000 Units by mouth every 7 (seven) days. Monday    [provider]      Allergies    Gabapentin, Ibuprofen, Zolpidem tartrate, and Naproxen    Review of Systems   Review of Systems  Constitutional:  Positive for fever.  Respiratory:  Positive for cough and shortness of breath.   Cardiovascular:  Positive for chest pain.  Gastrointestinal:  Positive for abdominal pain, diarrhea and nausea. Negative for vomiting.  Genitourinary:  Positive for dysuria.    Physical Exam Updated Vital Signs There were no vitals taken for this visit. Physical Exam Vitals and nursing note reviewed.  Constitutional:       General: He is not in acute distress.    Appearance: Normal appearance. He is well-developed.  HENT:     Head: Normocephalic and atraumatic.  Eyes:     Conjunctiva/sclera: Conjunctivae normal.  Cardiovascular:     Rate and Rhythm: Normal rate and regular rhythm.     Heart sounds: No murmur heard. Pulmonary:     Effort: Pulmonary effort is normal. No respiratory distress.     Breath sounds: Normal breath sounds.  Abdominal:     Palpations: Abdomen is soft.     Tenderness: There is abdominal tenderness (epigastric). There is no guarding or rebound.  Musculoskeletal:        General: No tenderness.     Cervical back: Neck supple.     Right lower leg: Edema present.     Left lower leg: Edema present.  Skin:    General: Skin is warm and dry.     Capillary Refill: Capillary refill takes less than 2 seconds.  Neurological:     General: No focal deficit present.     Mental Status: He is alert.     ED Results / Procedures / Treatments   Labs (all labs ordered are listed, but only abnormal results are displayed) Labs Reviewed  MAGNESIUM  BRAIN NATRIURETIC PEPTIDE  URINALYSIS, W/ REFLEX TO CULTURE (INFECTION SUSPECTED)  TROPONIN I (HIGH SENSITIVITY)    EKG None  Radiology DG Chest 2 View  Result Date: 08/20/2022 CLINICAL DATA:  Shortness of breath EXAM: CHEST - 2 VIEW COMPARISON:  07/24/2022 FINDINGS: Post sternotomy changes. Patchy atelectasis or minimal infiltrates at the bases. Normal cardiac size. No pneumothorax. Stent superior to the aortic arch. Scattered fluid levels over the upper abdomen. Air collection in the epigastric area. IMPRESSION: Patchy atelectasis or minimal infiltrates at the bases. Scattered air-fluid levels in the abdomen incompletely assessed. Focal air collection in the epigastrium probably air distended bowel, but recommend dedicated abdominal radiographs for further assessment. Electronically Signed   By: Jasmine Pang M.D.   On: 08/20/2022 15:34     Procedures Procedures  {Document cardiac monitor, telemetry assessment procedure when appropriate:1}  Medications Ordered in ED Medications - No data to display  ED Course/ Medical Decision Making/ A&P   {   Click here for ABCD2, HEART and other calculatorsREFRESH Note before signing :1}  Medical Decision Making Amount and/or Complexity of Data Reviewed Labs: ordered. Radiology: ordered.   This patient complains of ***; this involves an extensive number of treatment Options and is a complaint that carries with it a high risk of complications and morbidity. The differential includes ***  I ordered, reviewed and interpreted labs, which included *** I ordered medication *** and reviewed PMP when indicated. I ordered imaging studies which included *** and I independently    visualized and interpreted imaging which showed *** Additional history obtained from *** Previous records obtained and reviewed *** I consulted *** and discussed lab and imaging findings and discussed disposition.  Cardiac monitoring reviewed, *** Social determinants considered, *** Critical Interventions: ***  After the interventions stated above, I reevaluated the patient and found *** Admission and further testing considered, ***   {Document critical care time when appropriate:1} {Document review of labs and clinical decision tools ie heart score, Chads2Vasc2 etc:1}  {Document your independent review of radiology images, and any outside records:1} {Document your discussion with family members, caretakers, and with consultants:1} {Document social determinants of health affecting pt's care:1} {Document your decision making why or why not admission, treatments were needed:1} Final Clinical Impression(s) / ED Diagnoses Final diagnoses:  None    Rx / DC Orders ED Discharge Orders     None

## 2022-08-20 NOTE — Discharge Instructions (Addendum)
You were seen in the emergency department for continued upper abdominal pain anterior chest.  You had a fever here.  Your urinalysis showed signs of infection.  The CAT scan of your abdomen had some inflammation around your right kidney.  Please continue your reflux medication and Carafate.  We are prescribing you antibiotics.  Follow-up with your GI doctor for further evaluation of your reflux.

## 2022-08-20 NOTE — ED Triage Notes (Signed)
Pt via POV c/o SOB since last night. Pt notes he had a colonoscopy yesterday and then began feeling SOB last night while he was trying to sleep. Unsure of whether or not biopsies were performed yesterday. T 103.1 in triage. PMH includes DM2, carpal tunnel syndrome, bilateral TKA, COPD, CHF, MI, HTN, HLD.  Pt here earlier and left before being seen b/c long wait. Pt was called at home and informed that Dr wanted him to come back in to be seen. Pt agreed to this.

## 2022-08-20 NOTE — ED Notes (Signed)
Pt unable to give ua at this time.

## 2022-08-20 NOTE — ED Triage Notes (Signed)
Pt via POV c/o SOB since last night. Pt notes he had a colonoscopy yesterday and then began feeling SOB last night while he was trying to sleep. Unsure of whether or not biopsies were performed yesterday. T 103.1 in triage. PMH includes DM2, carpal tunnel syndrome, bilateral TKA, COPD, CHF, MI, HTN, HLD.

## 2022-08-20 NOTE — ED Provider Notes (Signed)
1700.  Signed up for patient but he was not in room.  Apparently was in a triage room and then was sent to x-ray.  Seems he possibly has eloped since then.  Nurses trying to find him.   Terrilee Files, MD 08/21/22 1001

## 2022-08-21 ENCOUNTER — Inpatient Hospital Stay (HOSPITAL_COMMUNITY)
Admission: EM | Admit: 2022-08-21 | Discharge: 2022-08-24 | DRG: 690 | Disposition: A | Discharge status: Home / Self Care | Payer: 59 | Attending: Internal Medicine | Admitting: Internal Medicine

## 2022-08-21 ENCOUNTER — Encounter (HOSPITAL_COMMUNITY): Payer: Self-pay | Admitting: Emergency Medicine

## 2022-08-21 DIAGNOSIS — I252 Old myocardial infarction: Secondary | ICD-10-CM

## 2022-08-21 DIAGNOSIS — I110 Hypertensive heart disease with heart failure: Secondary | ICD-10-CM | POA: Diagnosis present

## 2022-08-21 DIAGNOSIS — N12 Tubulo-interstitial nephritis, not specified as acute or chronic: Secondary | ICD-10-CM | POA: Diagnosis not present

## 2022-08-21 DIAGNOSIS — B9620 Unspecified Escherichia coli [E. coli] as the cause of diseases classified elsewhere: Secondary | ICD-10-CM | POA: Diagnosis present

## 2022-08-21 DIAGNOSIS — I10 Essential (primary) hypertension: Secondary | ICD-10-CM | POA: Diagnosis not present

## 2022-08-21 DIAGNOSIS — Z951 Presence of aortocoronary bypass graft: Secondary | ICD-10-CM

## 2022-08-21 DIAGNOSIS — Z86718 Personal history of other venous thrombosis and embolism: Secondary | ICD-10-CM

## 2022-08-21 DIAGNOSIS — I25118 Atherosclerotic heart disease of native coronary artery with other forms of angina pectoris: Secondary | ICD-10-CM | POA: Diagnosis present

## 2022-08-21 DIAGNOSIS — Z79899 Other long term (current) drug therapy: Secondary | ICD-10-CM

## 2022-08-21 DIAGNOSIS — K222 Esophageal obstruction: Secondary | ICD-10-CM | POA: Diagnosis present

## 2022-08-21 DIAGNOSIS — K219 Gastro-esophageal reflux disease without esophagitis: Secondary | ICD-10-CM | POA: Diagnosis present

## 2022-08-21 DIAGNOSIS — I5032 Chronic diastolic (congestive) heart failure: Secondary | ICD-10-CM | POA: Diagnosis present

## 2022-08-21 DIAGNOSIS — F319 Bipolar disorder, unspecified: Secondary | ICD-10-CM | POA: Diagnosis present

## 2022-08-21 DIAGNOSIS — E119 Type 2 diabetes mellitus without complications: Secondary | ICD-10-CM | POA: Diagnosis present

## 2022-08-21 DIAGNOSIS — Z7902 Long term (current) use of antithrombotics/antiplatelets: Secondary | ICD-10-CM

## 2022-08-21 DIAGNOSIS — F419 Anxiety disorder, unspecified: Secondary | ICD-10-CM | POA: Diagnosis present

## 2022-08-21 DIAGNOSIS — E876 Hypokalemia: Secondary | ICD-10-CM | POA: Diagnosis present

## 2022-08-21 DIAGNOSIS — E1159 Type 2 diabetes mellitus with other circulatory complications: Secondary | ICD-10-CM | POA: Diagnosis not present

## 2022-08-21 DIAGNOSIS — R7881 Bacteremia: Secondary | ICD-10-CM | POA: Diagnosis present

## 2022-08-21 DIAGNOSIS — K2960 Other gastritis without bleeding: Secondary | ICD-10-CM | POA: Diagnosis present

## 2022-08-21 DIAGNOSIS — K259 Gastric ulcer, unspecified as acute or chronic, without hemorrhage or perforation: Secondary | ICD-10-CM | POA: Diagnosis not present

## 2022-08-21 DIAGNOSIS — Z888 Allergy status to other drugs, medicaments and biological substances status: Secondary | ICD-10-CM

## 2022-08-21 DIAGNOSIS — Z7984 Long term (current) use of oral hypoglycemic drugs: Secondary | ICD-10-CM

## 2022-08-21 DIAGNOSIS — K3189 Other diseases of stomach and duodenum: Secondary | ICD-10-CM | POA: Diagnosis present

## 2022-08-21 DIAGNOSIS — E1140 Type 2 diabetes mellitus with diabetic neuropathy, unspecified: Secondary | ICD-10-CM | POA: Diagnosis present

## 2022-08-21 DIAGNOSIS — Z6832 Body mass index (BMI) 32.0-32.9, adult: Secondary | ICD-10-CM

## 2022-08-21 DIAGNOSIS — F1120 Opioid dependence, uncomplicated: Secondary | ICD-10-CM | POA: Diagnosis present

## 2022-08-21 DIAGNOSIS — E785 Hyperlipidemia, unspecified: Secondary | ICD-10-CM | POA: Diagnosis present

## 2022-08-21 DIAGNOSIS — R1319 Other dysphagia: Secondary | ICD-10-CM | POA: Diagnosis present

## 2022-08-21 DIAGNOSIS — R55 Syncope and collapse: Secondary | ICD-10-CM | POA: Diagnosis present

## 2022-08-21 DIAGNOSIS — M549 Dorsalgia, unspecified: Secondary | ICD-10-CM | POA: Diagnosis present

## 2022-08-21 DIAGNOSIS — Z8249 Family history of ischemic heart disease and other diseases of the circulatory system: Secondary | ICD-10-CM | POA: Diagnosis not present

## 2022-08-21 DIAGNOSIS — E669 Obesity, unspecified: Secondary | ICD-10-CM | POA: Insufficient documentation

## 2022-08-21 DIAGNOSIS — E782 Mixed hyperlipidemia: Secondary | ICD-10-CM | POA: Diagnosis present

## 2022-08-21 DIAGNOSIS — N10 Acute tubulo-interstitial nephritis: Secondary | ICD-10-CM | POA: Diagnosis present

## 2022-08-21 DIAGNOSIS — I2510 Atherosclerotic heart disease of native coronary artery without angina pectoris: Secondary | ICD-10-CM | POA: Diagnosis not present

## 2022-08-21 DIAGNOSIS — Z886 Allergy status to analgesic agent status: Secondary | ICD-10-CM

## 2022-08-21 DIAGNOSIS — Z7951 Long term (current) use of inhaled steroids: Secondary | ICD-10-CM

## 2022-08-21 DIAGNOSIS — Z833 Family history of diabetes mellitus: Secondary | ICD-10-CM

## 2022-08-21 DIAGNOSIS — T463X5A Adverse effect of coronary vasodilators, initial encounter: Secondary | ICD-10-CM | POA: Diagnosis present

## 2022-08-21 DIAGNOSIS — Z7985 Long-term (current) use of injectable non-insulin antidiabetic drugs: Secondary | ICD-10-CM

## 2022-08-21 DIAGNOSIS — J4489 Other specified chronic obstructive pulmonary disease: Secondary | ICD-10-CM | POA: Diagnosis present

## 2022-08-21 DIAGNOSIS — K5909 Other constipation: Secondary | ICD-10-CM | POA: Diagnosis present

## 2022-08-21 DIAGNOSIS — M069 Rheumatoid arthritis, unspecified: Secondary | ICD-10-CM | POA: Diagnosis present

## 2022-08-21 DIAGNOSIS — R1310 Dysphagia, unspecified: Secondary | ICD-10-CM | POA: Diagnosis not present

## 2022-08-21 DIAGNOSIS — K529 Noninfective gastroenteritis and colitis, unspecified: Secondary | ICD-10-CM | POA: Diagnosis present

## 2022-08-21 DIAGNOSIS — G8929 Other chronic pain: Secondary | ICD-10-CM | POA: Diagnosis present

## 2022-08-21 LAB — COMPREHENSIVE METABOLIC PANEL
ALT: 21 U/L (ref 0–44)
AST: 33 U/L (ref 15–41)
Albumin: 3.6 g/dL (ref 3.5–5.0)
Alkaline Phosphatase: 77 U/L (ref 38–126)
Anion gap: 11 (ref 5–15)
BUN: 11 mg/dL (ref 8–23)
CO2: 25 mmol/L (ref 22–32)
Calcium: 8.4 mg/dL — ABNORMAL LOW (ref 8.9–10.3)
Chloride: 95 mmol/L — ABNORMAL LOW (ref 98–111)
Creatinine, Ser: 1.29 mg/dL — ABNORMAL HIGH (ref 0.61–1.24)
GFR, Estimated: >60 mL/min (ref >60)
Glucose, Bld: 109 mg/dL — ABNORMAL HIGH (ref 70–99)
Potassium: 3.7 mmol/L (ref 3.5–5.1)
Sodium: 131 mmol/L — ABNORMAL LOW (ref 135–145)
Total Bilirubin: 1.9 mg/dL — ABNORMAL HIGH (ref 0.3–1.2)
Total Protein: 7.9 g/dL (ref 6.5–8.1)

## 2022-08-21 LAB — GLUCOSE, CAPILLARY
Glucose-Capillary: 104 mg/dL — ABNORMAL HIGH (ref 70–99)
Glucose-Capillary: 112 mg/dL — ABNORMAL HIGH (ref 70–99)

## 2022-08-21 LAB — C DIFFICILE QUICK SCREEN W PCR REFLEX
C Diff antigen: NEGATIVE
C Diff interpretation: NOT DETECTED
C Diff toxin: NEGATIVE

## 2022-08-21 LAB — BLOOD CULTURE ID PANEL (REFLEXED) - BCID2

## 2022-08-21 LAB — CBC WITH DIFFERENTIAL/PLATELET
Abs Immature Granulocytes: 0.03 10*3/uL (ref 0.00–0.07)
Basophils Absolute: 0 10*3/uL (ref 0.0–0.1)
Basophils Relative: 0 %
Eosinophils Absolute: 0 10*3/uL (ref 0.0–0.5)
Eosinophils Relative: 0 %
HCT: 32.6 % — ABNORMAL LOW (ref 39.0–52.0)
Hemoglobin: 11.3 g/dL — ABNORMAL LOW (ref 13.0–17.0)
Immature Granulocytes: 0 %
Lymphocytes Relative: 13 %
Lymphs Abs: 1 10*3/uL (ref 0.7–4.0)
MCH: 29.2 pg (ref 26.0–34.0)
MCHC: 34.7 g/dL (ref 30.0–36.0)
MCV: 84.2 fL (ref 80.0–100.0)
Monocytes Absolute: 0.7 10*3/uL (ref 0.1–1.0)
Monocytes Relative: 8 %
Neutro Abs: 6.5 10*3/uL (ref 1.7–7.7)
Neutrophils Relative %: 79 %
Platelets: 152 10*3/uL (ref 150–400)
RBC: 3.87 MIL/uL — ABNORMAL LOW (ref 4.22–5.81)
RDW: 14.6 % (ref 11.5–15.5)
WBC: 8.3 10*3/uL (ref 4.0–10.5)
nRBC: 0 % (ref 0.0–0.2)

## 2022-08-21 LAB — PROCALCITONIN: Procalcitonin: 1.72 ng/mL

## 2022-08-21 LAB — CULTURE, BLOOD (ROUTINE X 2)

## 2022-08-21 LAB — TROPONIN I (HIGH SENSITIVITY)
Troponin I (High Sensitivity): 6 ng/L (ref <18)
Troponin I (High Sensitivity): 8 ng/L (ref <18)

## 2022-08-21 MED ORDER — OXYCODONE-ACETAMINOPHEN 10-325 MG PO TABS: 1.0000 | ORAL_TABLET | ORAL | Status: DC | PRN

## 2022-08-21 MED ORDER — SODIUM CHLORIDE 0.9 % IV SOLN
2.0000 g | INTRAVENOUS | Status: DC
  Administered 2022-08-22 – 2022-08-24 (×3): 2 g via INTRAVENOUS
  Filled 2022-08-21 (×3): qty 20

## 2022-08-21 MED ORDER — LINACLOTIDE 145 MCG PO CAPS: 290.0000 ug | ORAL_CAPSULE | Freq: Every day | ORAL | Status: DC

## 2022-08-21 MED ORDER — SODIUM CHLORIDE 0.9 % IV BOLUS
1000.0000 mL | Freq: Once | INTRAVENOUS | Status: AC
  Administered 2022-08-21: 1000 mL via INTRAVENOUS

## 2022-08-21 MED ORDER — ACETAMINOPHEN 325 MG PO TABS
650.0000 mg | ORAL_TABLET | Freq: Once | ORAL | Status: AC
  Administered 2022-08-21: 650 mg via ORAL
  Filled 2022-08-21: qty 2

## 2022-08-21 MED ORDER — ENOXAPARIN SODIUM 40 MG/0.4ML IJ SOSY
40.0000 mg | PREFILLED_SYRINGE | INTRAMUSCULAR | Status: DC
  Administered 2022-08-21: 40 mg via SUBCUTANEOUS
  Filled 2022-08-21: qty 0.4

## 2022-08-21 MED ORDER — SODIUM CHLORIDE 0.9 % IV SOLN
2.0000 g | Freq: Once | INTRAVENOUS | Status: AC
  Administered 2022-08-21: 2 g via INTRAVENOUS
  Filled 2022-08-21: qty 20

## 2022-08-21 MED ORDER — INSULIN ASPART 100 UNIT/ML IJ SOLN
0.0000 [IU] | Freq: Three times a day (TID) | INTRAMUSCULAR | Status: DC
  Administered 2022-08-22 (×2): 1 [IU] via SUBCUTANEOUS

## 2022-08-21 MED ORDER — ATORVASTATIN CALCIUM 40 MG PO TABS
80.0000 mg | ORAL_TABLET | Freq: Every day | ORAL | Status: DC
  Administered 2022-08-22 – 2022-08-24 (×3): 80 mg via ORAL
  Filled 2022-08-21 (×3): qty 2

## 2022-08-21 MED ORDER — POTASSIUM CHLORIDE IN NACL 20-0.9 MEQ/L-% IV SOLN: INTRAVENOUS | Status: AC

## 2022-08-21 MED ORDER — SERTRALINE HCL 50 MG PO TABS
100.0000 mg | ORAL_TABLET | Freq: Every day | ORAL | Status: DC
  Administered 2022-08-22 – 2022-08-24 (×3): 100 mg via ORAL
  Filled 2022-08-21 (×3): qty 2

## 2022-08-21 MED ORDER — LORATADINE 10 MG PO TABS
10.0000 mg | ORAL_TABLET | Freq: Every day | ORAL | Status: DC | PRN
  Administered 2022-08-21: 10 mg via ORAL
  Filled 2022-08-21: qty 1

## 2022-08-21 MED ORDER — PANTOPRAZOLE SODIUM 40 MG PO TBEC
40.0000 mg | DELAYED_RELEASE_TABLET | Freq: Two times a day (BID) | ORAL | Status: DC
  Administered 2022-08-21 – 2022-08-24 (×6): 40 mg via ORAL
  Filled 2022-08-21 (×6): qty 1

## 2022-08-21 MED ORDER — TAMSULOSIN HCL 0.4 MG PO CAPS
0.4000 mg | ORAL_CAPSULE | Freq: Every day | ORAL | Status: DC
  Administered 2022-08-22 – 2022-08-24 (×3): 0.4 mg via ORAL
  Filled 2022-08-21 (×4): qty 1

## 2022-08-21 MED ORDER — OXYCODONE HCL 5 MG PO TABS
5.0000 mg | ORAL_TABLET | ORAL | Status: DC | PRN
  Administered 2022-08-21 – 2022-08-22 (×3): 5 mg via ORAL
  Filled 2022-08-21 (×3): qty 1

## 2022-08-21 MED ORDER — PROCHLORPERAZINE EDISYLATE 10 MG/2ML IJ SOLN: 5.0000 mg | Freq: Four times a day (QID) | INTRAMUSCULAR | Status: DC | PRN

## 2022-08-21 MED ORDER — EZETIMIBE 10 MG PO TABS
10.0000 mg | ORAL_TABLET | Freq: Every day | ORAL | Status: DC
  Administered 2022-08-22 – 2022-08-24 (×3): 10 mg via ORAL
  Filled 2022-08-21 (×3): qty 1

## 2022-08-21 MED ORDER — ACETAMINOPHEN 325 MG PO TABS
650.0000 mg | ORAL_TABLET | Freq: Four times a day (QID) | ORAL | Status: DC | PRN
  Administered 2022-08-21: 650 mg via ORAL
  Filled 2022-08-21: qty 2

## 2022-08-21 MED ORDER — ALBUTEROL SULFATE (2.5 MG/3ML) 0.083% IN NEBU: 2.5000 mg | INHALATION_SOLUTION | Freq: Four times a day (QID) | RESPIRATORY_TRACT | Status: DC | PRN

## 2022-08-21 MED ORDER — FLUTICASONE PROPIONATE 50 MCG/ACT NA SUSP
2.0000 | Freq: Every day | NASAL | Status: DC
  Administered 2022-08-22 – 2022-08-24 (×3): 2 via NASAL
  Filled 2022-08-21 (×2): qty 16

## 2022-08-21 MED ORDER — ACETAMINOPHEN 650 MG RE SUPP: 650.0000 mg | Freq: Four times a day (QID) | RECTAL | Status: DC | PRN

## 2022-08-21 MED ORDER — METOPROLOL TARTRATE 25 MG PO TABS
25.0000 mg | ORAL_TABLET | Freq: Two times a day (BID) | ORAL | Status: DC
  Administered 2022-08-21: 25 mg via ORAL
  Filled 2022-08-21 (×2): qty 1

## 2022-08-21 MED ORDER — OXYCODONE-ACETAMINOPHEN 5-325 MG PO TABS
1.0000 | ORAL_TABLET | ORAL | Status: DC | PRN
  Administered 2022-08-21 – 2022-08-22 (×3): 1 via ORAL
  Filled 2022-08-21 (×3): qty 1

## 2022-08-21 MED ORDER — ALPRAZOLAM 1 MG PO TABS: 1.0000 mg | ORAL_TABLET | Freq: Four times a day (QID) | ORAL | Status: DC | PRN

## 2022-08-21 NOTE — ED Provider Notes (Signed)
[place] EMERGENCY DEPARTMENT AT [facility] Provider Note   CSN: [id number] Arrival date & time: 08/21/22  1130     History  Chief Complaint  Patient presents with   Abnormal labs    Dalton [name] is a 62 y.o. male.  Patient has a history of hypertension coronary artery disease COPD heart failure and was diagnosed with pyelonephritis yesterday.  He had a positive blood culture today with gram-negative rods and was instructed to come back to the emergency room department.  Patient has 100.9 temp on arriving in the emergency department and then it went up to 103.  The history is provided by the patient and medical records. No language interpreter was used.  Weakness Severity:  Moderate Onset quality:  Sudden Timing:  Constant Progression:  Worsening Chronicity:  New Context: not alcohol use   Relieved by:  Nothing Worsened by:  Nothing Ineffective treatments:  None tried Associated symptoms: no abdominal pain, no chest pain, no cough, no diarrhea, no frequency, no headaches and no seizures        Home Medications Prior to Admission medications   Medication Sig Start Date End Date Taking? Authorizing Provider  albuterol (PROVENTIL) (2.5 MG/3ML) 0.083% nebulizer solution Take 3 mLs (2.5 mg total) by nebulization every 6 (six) hours as needed for wheezing or shortness of breath. 07/08/22   Eber Hong, MD  albuterol (VENTOLIN HFA) 108 (90 Base) MCG/ACT inhaler Inhale 2 puffs into the lungs every 4 (four) hours as needed for wheezing or shortness of breath. 07/08/22   Eber Hong, MD  ALPRAZolam Prudy Feeler) 1 MG tablet Take 1 mg by mouth 4 (four) times daily. 08/01/19   [provider]  atorvastatin (LIPITOR) 80 MG tablet Take 80 mg by mouth at bedtime. 12/05/20   [provider]  azelastine (ASTELIN) 0.1 % nasal spray Place 1 spray into both nostrils 2 (two) times daily. 12/29/21   [provider]  beclomethasone (QVAR) 80 MCG/ACT inhaler  Inhale 2 puffs into the lungs 2 (two) times daily as needed (respiratory issues.).     [provider]  BELSOMRA 15 MG TABS Take 1 tablet by mouth at bedtime. 04/30/21   [provider]  brimonidine (ALPHAGAN P) 0.1 % SOLN Place 1 drop into the right eye 2 (two) times daily. 12/25/20   [provider]  cephALEXin (KEFLEX) 500 MG capsule Take 1 capsule (500 mg total) by mouth 4 (four) times daily. 08/20/22   Terrilee Files, MD  cetirizine (ZYRTEC) 10 MG tablet Take 10 mg by mouth daily as needed for allergies.  01/26/16   [provider]  clopidogrel (PLAVIX) 75 MG tablet Take 75 mg by mouth daily.    [provider]  dexlansoprazole (DEXILANT) 60 MG capsule Take 1 capsule (60 mg total) by mouth daily. 07/05/17   Fields, Darleene Cleaver, MD  esomeprazole (NEXIUM) 40 MG capsule Take 40 mg by mouth daily. 07/06/22   [provider]  Evolocumab (REPATHA SURECLICK) 140 MG/ML SOAJ Take 140 mg by mouth every 14 (fourteen) days. 03/10/22   Antoine Poche, MD  ezetimibe (ZETIA) 10 MG tablet Take 10 mg by mouth daily.    [provider]  fluticasone (FLONASE) 50 MCG/ACT nasal spray Place 2 sprays into both nostrils daily.    [provider]  furosemide (LASIX) 40 MG tablet TAKE 1 TABLET BY MOUTH EVERY DAY AND MAY TAKE ADDITIONAL TABLET AS NEEDED FOR SWELLING OR WEIGHT GAIN 08/03/22   Robin Searing  Maud Deed., NP  glipiZIDE (GLUCOTROL) 10 MG tablet Take 10 mg by mouth daily before breakfast.    [provider]  isosorbide mononitrate (IMDUR) 60 MG 24 hr tablet Take 60 mg by mouth daily.    [provider]  ketoconazole (NIZORAL) 2 % shampoo Apply 1 application topically daily as needed for irritation.  10/23/19   [provider]  latanoprost (XALATAN) 0.005 % ophthalmic solution Place 1 drop into both eyes at bedtime.    [provider]  LINZESS 290 MCG CAPS capsule TAKE 1 CAPSULE(290 MCG) BY MOUTH DAILY BEFORE  BREAKFAST Patient taking differently: Take 290 mcg by mouth daily before breakfast. May take 1 additional capsule as needed for constipation 02/04/21   Tiffany Kocher, PA-C  lisinopril (ZESTRIL) 10 MG tablet Take 1 tablet (10 mg total) by mouth daily. 05/21/22   Johnson, Clanford L, MD  lisinopril-hydrochlorothiazide (ZESTORETIC) 10-12.5 MG tablet Take 1 tablet by mouth daily. Patient not taking: Reported on 07/24/2022    [provider]  magnesium citrate SOLN Take 1 Bottle by mouth as needed for moderate constipation.     [provider]  metFORMIN (GLUCOPHAGE-XR) 500 MG 24 hr tablet Take 500 mg by mouth daily with breakfast.     [provider]  metoprolol tartrate (LOPRESSOR) 50 MG tablet TAKE 1 TABLET(50 MG) BY MOUTH TWICE DAILY 02/04/22   Lyn Records, MD  MOUNJARO 2.5 MG/0.5ML Pen Inject 2.5 mg into the skin once a week. Patient not taking: Reported on 07/08/2022    [provider]  MOVANTIK 25 MG TABS tablet Take 25 mg by mouth every morning. 07/09/22   [provider]  Multiple Vitamin (MULTIVITAMIN WITH MINERALS) TABS tablet Take 1 tablet by mouth daily.    [provider]  NARCAN 4 MG/0.1ML LIQD nasal spray kit Place 1 spray into the nose as needed (opioid overdose).  06/15/17   [provider]  nitroGLYCERIN (NITROSTAT) 0.4 MG SL tablet Place 1 tablet (0.4 mg total) under the tongue every 5 (five) minutes as needed for chest pain. 08/26/21   Johnson, Clanford L, MD  omeprazole (PRILOSEC) 20 MG capsule Take 20 mg by mouth 2 (two) times daily as needed. 07/21/22   [provider]  oxyCODONE-acetaminophen (PERCOCET) 10-325 MG tablet Take 1 tablet by mouth every 4 (four) hours as needed for pain.    [provider]  potassium chloride SA (KLOR-CON) 20 MEQ tablet Take 20 mEq by mouth 2 (two) times daily.    [provider]  predniSONE (DELTASONE) 20 MG tablet Take 2 tablets (40 mg total) by mouth  daily. Patient not taking: Reported on 07/24/2022 07/08/22   Eber Hong, MD  sertraline (ZOLOFT) 100 MG tablet Take 100 mg by mouth daily.    [provider]  sildenafil (VIAGRA) 100 MG tablet Take by mouth as needed for erectile dysfunction. 03/11/22   [provider]  sucralfate (CARAFATE) 1 GM/10ML suspension in the morning, at noon, in the evening, and at bedtime. 09/10/20   [provider]  tamsulosin (FLOMAX) 0.4 MG CAPS capsule Take 1 capsule (0.4 mg total) by mouth daily after breakfast. 01/07/16   Adonis Brook, NP  TRULANCE 3 MG TABS Take 1 tablet by mouth daily.    [provider]  Vitamin D, Ergocalciferol, (DRISDOL) 1.25 MG (50000 UT) CAPS capsule Take 50,000 Units by mouth every 7 (seven) days. Monday    [provider]      Allergies  Gabapentin, Ibuprofen, Zolpidem tartrate, and Naproxen    Review of Systems   Review of Systems  Constitutional:  Negative for appetite change and fatigue.  HENT:  Negative for congestion, ear discharge and sinus pressure.   Eyes:  Negative for discharge.  Respiratory:  Negative for cough.   Cardiovascular:  Negative for chest pain.  Gastrointestinal:  Negative for abdominal pain and diarrhea.  Genitourinary:  Negative for frequency and hematuria.  Musculoskeletal:  Negative for back pain.  Skin:  Negative for rash.  Neurological:  Positive for weakness. Negative for seizures and headaches.  Psychiatric/Behavioral:  Negative for hallucinations.     Physical Exam Updated Vital Signs BP 120/76   Pulse 90   Temp (!) 103 F (39.4 C) (Oral)   Resp 18   Ht 5\' 10"  (1.778 m)   Wt 103.9 kg   SpO2 98%   BMI 32.86 kg/m  Physical Exam Vitals and nursing note reviewed.  Constitutional:      Appearance: He is well-developed.  HENT:     Head: Normocephalic.     Nose: Nose normal.  Eyes:     General: No scleral icterus.    Conjunctiva/sclera: Conjunctivae normal.  Neck:     Thyroid: No  thyromegaly.  Cardiovascular:     Rate and Rhythm: Normal rate and regular rhythm.     Heart sounds: No murmur heard.    No friction rub. No gallop.  Pulmonary:     Breath sounds: No stridor. No wheezing or rales.  Chest:     Chest wall: No tenderness.  Abdominal:     General: There is no distension.     Tenderness: There is no abdominal tenderness. There is no rebound.  Musculoskeletal:        General: Normal range of motion.     Cervical back: Neck supple.  Lymphadenopathy:     Cervical: No cervical adenopathy.  Skin:    Findings: No erythema or rash.  Neurological:     Mental Status: He is alert and oriented to person, place, and time.     Motor: No abnormal muscle tone.     Coordination: Coordination normal.  Psychiatric:        Behavior: Behavior normal.     ED Results / Procedures / Treatments   Labs (all labs ordered are listed, but only abnormal results are displayed) Labs Reviewed  CBC WITH DIFFERENTIAL/PLATELET - Abnormal; Notable for the following components:      Result Value   RBC 3.87 (*)    Hemoglobin 11.3 (*)    HCT 32.6 (*)    All other components within normal limits  COMPREHENSIVE METABOLIC PANEL - Abnormal; Notable for the following components:   Sodium 131 (*)    Chloride 95 (*)    Glucose, Bld 109 (*)    Creatinine, Ser 1.29 (*)    Calcium 8.4 (*)    Total Bilirubin 1.9 (*)    All other components within normal limits    EKG None  Radiology CT CHEST ABDOMEN PELVIS W CONTRAST  Result Date: 08/20/2022 CLINICAL DATA:  Sepsis, fever EXAM: CT CHEST, ABDOMEN, AND PELVIS WITH CONTRAST TECHNIQUE: Multidetector CT imaging of the chest, abdomen and pelvis was performed following the standard protocol during bolus administration of intravenous contrast. RADIATION DOSE REDUCTION: This exam was performed according to the departmental dose-optimization program which includes automated exposure control, adjustment of the mA and/or kV according to patient  size and/or use of iterative reconstruction technique. CONTRAST:   OMNIPAQUE IOHEXOL 300 MG/ML  SOLN COMPARISON:  CT chest done on 01/02/2022 FINDINGS: CT CHEST FINDINGS Cardiovascular: There is evidence of previous coronary bypass surgery. There is vascular stent in proximal course of left subclavian artery. Mediastinum/Nodes: No significant lymphadenopathy is seen. Lungs/Pleura: Small linear densities are seen in left lower lung fields suggesting scarring or subsegmental atelectasis. There is no focal pulmonary consolidation. There is no pleural effusion or pneumothorax. Musculoskeletal: No acute findings are seen. Degenerative changes are noted in lumbar spine with encroachment of neural foramina at multiple levels, more so at L5-S1 level. CT ABDOMEN PELVIS FINDINGS Hepatobiliary: No focal abnormalities are seen in liver. There is no dilation of bile ducts. There is increased density in gallbladder lumen. There is no wall thickening in gallbladder. Pancreas: No focal abnormalities are seen. Spleen: Unremarkable. Adrenals/Urinary Tract: Adrenals are unremarkable. There is no hydronephrosis. There are patchy areas of decreased enhancement in right renal cortex. Right kidney slightly larger than left. There is mild right perinephric stranding. There are no renal or ureteral stones. Urinary bladder is unremarkable. Stomach/Bowel: Small hiatal hernia is seen. Small bowel loops are not dilated. There is fluid in the lumen of small bowel loops and colon which may be a normal variation or suggest nonspecific enteritis. There is no significant wall thickening in the small bowel loops and colon. The appendix is not seen. There is no pericolic stranding. Vascular/Lymphatic: Unremarkable Reproductive: Unremarkable. Other: There is no ascites or pneumoperitoneum. Small umbilical hernia containing fat is seen. Musculoskeletal: Degenerative changes are noted with spinal stenosis and encroachment of neural foramina at  multiple levels in lower lumbar spine. IMPRESSION: There are patchy areas of decreased cortical enhancement in right kidney suggesting acute pyelonephritis. There is no hydronephrosis. There are no renal or ureteral stones. There is no focal pulmonary consolidation. There is no pleural effusion or pneumothorax. Previous coronary bypass surgery. Vascular stent is noted in the proximal course of left subclavian artery. There is no evidence of intestinal obstruction. There is fluid in the lumen of small bowel loops and colon which may be a normal variation or suggest nonspecific enteritis. There is no bowel wall thickening or significant dilation. There is subtle increased density in the lumen of gallbladder suggesting possible gallbladder stones. There are no signs of acute cholecystitis. There is no dilation of bile ducts. Follow-up gallbladder sonogram in nonemergent setting may be considered. Small hiatal hernia.  Lumbar spondylosis. Electronically Signed   By: Ernie Avena M.D.   On: 08/20/2022 20:42   DG Chest 2 View  Result Date: 08/20/2022 CLINICAL DATA:  Shortness of breath EXAM: CHEST - 2 VIEW COMPARISON:  07/24/2022 FINDINGS: Post sternotomy changes. Patchy atelectasis or minimal infiltrates at the bases. Normal cardiac size. No pneumothorax. Stent superior to the aortic arch. Scattered fluid levels over the upper abdomen. Air collection in the epigastric area. IMPRESSION: Patchy atelectasis or minimal infiltrates at the bases. Scattered air-fluid levels in the abdomen incompletely assessed. Focal air collection in the epigastrium probably air distended bowel, but recommend dedicated abdominal radiographs for further assessment. Electronically Signed   By: Jasmine Pang M.D.   On: 08/20/2022 15:34    Procedures Procedures    Medications Ordered in ED Medications  cefTRIAXone (ROCEPHIN) 2 g in sodium chloride 0.9 % 100 mL IVPB (2 g Intravenous New Bag/Given 08/21/22 1218)  acetaminophen  (TYLENOL) tablet 650 mg (650 mg Oral Given 08/21/22 1310)  sodium chloride 0.9 % bolus 1,000 mL (1,000 mLs Intravenous New Bag/Given 08/21/22 1319)  ED Course/ Medical Decision Making/ A&P                             Medical Decision Making Amount and/or Complexity of Data Reviewed Labs: ordered.  Risk OTC drugs. Decision regarding hospitalization.   Patient with pyelonephritis and positive blood culture for gram-negative rods.  He will be admitted to medicine        Final Clinical Impression(s) / ED Diagnoses Final diagnoses:  Pyelonephritis    Rx / DC Orders ED Discharge Orders     None         Bethann Berkshire, MD 08/25/22 1136

## 2022-08-21 NOTE — Hospital Course (Signed)
62 year old male with a history of coronary artery disease status post CABG, COPD, esophagitis, HFpEF, anxiety/depression, SVT, hypertension, hyperlipidemia, and diabetes mellitus type 2 presenting with upper abdominal pain and bacteremia.  The patient presented to the ER on 08/12/2022 initially for upper abdominal pain radiating to chest of several months duration.  The patient initially eloped in triage.  He subsequently returned to the ED later in the day, and his workup revealed that the patient had pyelonephritis.  The patient was discharged home in stable condition with cephalexin. The patient was called back to the ED on 08/21/2022 because of positive blood cultures with gram-negative rods.. Notably, the patient states that he has had subjective fevers and chills since 08/16/2022.  He has had some nausea without emesis.  He complains of upper abdominal pain as well as left sided abdominal pain.  He has some dysuria.  There is no hematuria.  He denies hematochezia or melena.  He has been having some loose stools but not able to quantify how much.  He has been complaining of intermittent solid food dysphagia for the better part of a month.  He denies any chest pain, headache, neck pain, cough, hemoptysis.  Notably, the patient has been having dysphagia type symptoms and upper abdominal pain for several months.  He underwent EGD on 08/03/2022 by Dr. Kinnie Scales which showed abundant solid food in the stomach.  The procedure was subsequently aborted to avoid aspiration and requiring intubation.  The plan was to have the patient have an upper GI series done.  Apparently this was scheduled for 08/19/2022.  The patient says that he had this done, but the results are not obtainable at this time.  Nevertheless, the patient states that he continues taking PPI twice daily.  He is unsure if he takes Carafate.  In the ED, the patient was febrile up to 103.1 F.  He was hemodynamically stable with oxygen saturation 96% on  room air.  WBC 8.3, hemoglobin 11.3, platelets 152,000.  Sodium 131, potassium 3.7, bicarbonate 25, serum creatinine 1.29.  08/20/2022 CT of the abdomen and pelvis showed patchy areas of decreased cortical enhancement in the right kidney consistent with acute pyelonephritis.  There is no hydronephrosis or ureteral stones.  The patient was started ceftriaxone.  Since admission, the patient is gradually improving clinically.  Stool for C. difficile was negative.  However he did complain of having a melanotic stool on the evening of 08/21/2022.  GI was consulted and plans for endoscopy.

## 2022-08-21 NOTE — ED Notes (Signed)
Nurse spoke with Pt and he stated he would come back to ED in a little bit.   08/21/22 @1042 

## 2022-08-21 NOTE — H&P (Addendum)
History and Physical    Patient: Dalton [name]:[id number] DOB: 11-03-1960 DOA: 08/21/2022 DOS: the patient was seen and examined on 08/21/2022 PCP: Fleet Contras, MD  Patient coming from: Home  Chief Complaint:  Chief Complaint  Patient presents with   Abnormal labs   HPI: Dalton [name] is a 62 year old male with a history of coronary artery disease status post CABG, COPD, esophagitis, HFpEF, anxiety/depression, SVT, hypertension, hyperlipidemia, and diabetes mellitus type 2 presenting with upper abdominal pain and bacteremia.  The patient presented to the ER on 08/12/2022 initially for upper abdominal pain radiating to chest of several months duration.  The patient initially eloped in triage.  He subsequently returned to the ED later in the day, and his workup revealed that the patient had pyelonephritis.  The patient was discharged home in stable condition with cephalexin. The patient was called back to the ED on 08/21/2022 because of positive blood cultures with gram-negative rods.. Notably, the patient states that he has had subjective fevers and chills since 08/16/2022.  He has had some nausea without emesis.  He complains of upper abdominal pain as well as left sided abdominal pain.  He has some dysuria.  There is no hematuria.  He denies hematochezia or melena.  He has been having some loose stools but not able to quantify how much.  He denies any chest pain, headache, neck pain, cough, hemoptysis.  Notably, the patient has been having dysphagia type symptoms and upper abdominal pain for several months.  He underwent EGD on 08/03/2022 by Dr. Kinnie Scales which showed abundant solid food in the stomach.  The procedure was subsequently aborted to avoid aspiration and requiring intubation.  The plan was to have the patient have an upper GI series done.  Apparently this was scheduled for 08/19/2022.  The patient says that he had this done, but the results are not obtainable at this time.   Nevertheless, the patient states that he continues taking PPI twice daily.  He is unsure if he takes Carafate.  In the ED, the patient was febrile up to 103.1 F.  He was hemodynamically stable with oxygen saturation 96% on room air.  WBC 8.3, hemoglobin 11.3, platelets 152,000.  Sodium 131, potassium 3.7, bicarbonate 25, serum creatinine 1.29.  08/20/2022 CT of the abdomen and pelvis showed patchy areas of decreased cortical enhancement in the right kidney consistent with acute pyelonephritis.  There is no hydronephrosis or ureteral stones.  The patient was started ceftriaxone.  Review of Systems: As mentioned in the history of present illness. All other systems reviewed and are negative. Past Medical History:  Diagnosis Date   (HFpEF) heart failure with preserved ejection fraction (HCC)    Anemia    Anxiety    Asthma    Bipolar disorder (HCC)    Chronic back pain    [facility] in [place]   Chronic bronchitis    COPD (chronic obstructive pulmonary disease) (HCC)    Coronary artery disease 2002   CABG (LIMA to LAD)   Depression    DVT (deep venous thrombosis) (HCC) ~ 2005   LLE   Frequency of urination    GERD (gastroesophageal reflux disease)    Headache(784.0)    History of seizures    Last 2011   HNP (herniated nucleus pulposus), cervical    Hyperlipidemia    Hypertension    MI (myocardial infarction) (HCC)    Neuropathy    NSAID-induced gastric ulcer    Rheumatoid arthritis (HCC)  Tonsillitis, chronic    Dr. [name] in [place]   Type II diabetes mellitus [facility])    Wears glasses    Past Surgical History:  Procedure Laterality Date   ANTERIOR CERVICAL DECOMP/DISCECTOMY FUSION N/A 11/05/2016   Procedure: Anterior Cervical Discectomy and Fusion - Cervical three-Cervical four;  Surgeon: Julio Sicks, MD;  Location: [facility] OR;  Service: Neurosurgery;  Laterality: N/A;   BACK SURGERY     x3   BALLOON DILATION N/A 12/04/2019   Procedure: BALLOON DILATION;  Surgeon:  Lanelle Bal, DO;  Location: [facility];  Service: Endoscopy;  Laterality: N/A;   BIOPSY N/A 05/30/2012   Procedure: BIOPSY;  Surgeon: West Bali, MD;  Location: [facility] ORS;  Service: Endoscopy;  Laterality: N/A;   BIOPSY  03/02/2016   Procedure: BIOPSY;  Surgeon: West Bali, MD;  Location: [facility];  Service: Endoscopy;;  gastric   BIOPSY  12/04/2019   Procedure: BIOPSY;  Surgeon: Lanelle Bal, DO;  [facility]: [facility];  Service: Endoscopy;;   CARDIAC CATHETERIZATION  "several"   CARPAL TUNNEL RELEASE Bilateral    CATARACT EXTRACTION W/PHACO Right 06/15/2016   Procedure: CATARACT EXTRACTION PHACO AND INTRAOCULAR LENS PLACEMENT (IOC);  Surgeon: Jethro Bolus, MD;  Location: [facility] OR[facility];  Service: Ophthalmology;  Laterality: Right;  CDE: 4.64   CATARACT EXTRACTION W/PHACO Left 07/13/2016   Procedure: CATARACT EXTRACTION PHACO AND INTRAOCULAR LENS PLACEMENT (IOC);  Surgeon: Jethro Bolus, MD;  Location: [facility];  Service: Ophthalmology;  Laterality: Left;  CDE: 3.15   COLONOSCOPY  12/28/2010   SLF: (MAC)Internal hemorrhoids/four small colon polyps tubular adenomas. per SLF: colonoscopy 2022   COLONOSCOPY WITH PROPOFOL N/A 07/05/2017   Surgeon: West Bali, MD; tubular adenoma, hyperplastic polyp, submucosal nodule at hepatic flexure consistent with lipoma on biopsy, diverticulosis and rectosigmoid and sigmoid colon, rectal bleeding due to large internal hemorrhoids.  Recommended repeat in 3 years due to history of polyps and oily film on scope.   CORONARY ARTERY BYPASS GRAFT  2002   3 vessels   ESOPHAGOGASTRODUODENOSCOPY N/A 05/30/2012   SLF: UNCONTROLLED GERD DUE TO LIFESTYLE CHOICE/WEIGHT GAIN/MILD Non-erosive gastritis   ESOPHAGOGASTRODUODENOSCOPY (EGD) WITH PROPOFOL N/A 03/02/2016   Dr. [name]: Esophagus appeared normal, impaired dilation performed, patchy inflammation with edema and erythema of the entire stomach., Biopsy with H pylori, patient completed Pylera.     ESOPHAGOGASTRODUODENOSCOPY (EGD) WITH PROPOFOL N/A 12/04/2019   Surgeon: Lanelle Bal, DO; benign appearing esophageal stenosis dilated, gastritis biopsied (mild reactive gastropathy, gastritis, negative H. pylori), normal duodenum.   FRACTURE SURGERY     LEFT HEART CATHETERIZATION WITH CORONARY ANGIOGRAM N/A 08/31/2011   Procedure: LEFT HEART CATHETERIZATION WITH CORONARY ANGIOGRAM;  Surgeon: [name], MD;  Location: [facility] CATH LAB;  Service: Cardiovascular;  Laterality: N/A;   LUMBAR DISC SURGERY     "L4-5; Dr. Dutch Quint"   LUMBAR LAMINECTOMY/DECOMPRESSION MICRODISCECTOMY Right 03/06/2019   Procedure: MICRODISCECTOMY EXTRAFORAMINAL LUMBAR THREE - LUMBAR FOUR RIGHT;  Surgeon: Julio Sicks, MD;  Location[facility] OR;  Service: Neurosurgery;  Laterality: Right;  MICRODISCECTOMY EXTRAFORAMINAL LUMBAR THREE - LUMBAR FOUR RIGHT   MULTIPLE TOOTH EXTRACTIONS     ORIF MANDIBULAR FRACTURE N/A 12/19/2015   Procedure: OPEN REDUCTION INTERNAL FIXATION (ORIF) MANDIBULAR FRACTURE;  Surgeon: [name], MD;  Location: [facility] OR;  Service: ENT;  Laterality: N/A;   PATELLA FRACTURE SURGERY Left 1976   plate to knee cap from accident   POLYPECTOMY  07/05/2017   Procedure: POLYPECTOMY;  Surgeon: West Bali,  MD;  [facility];  Service: Endoscopy;;  colon   SAVORY DILATION  12/28/2010   SLF:(MAC)J-shaped stomach/nodular mocosa in the distal esophagus/empiric dilation 16mm   SAVORY DILATION N/A 03/02/2016   Procedure: SAVORY DILATION;  Surgeon: West Bali, MD;  [facility];  Service: Endoscopy;  Laterality: N/A;   Social History:  reports that he has never smoked. He has never used smokeless tobacco. He reports that he does not currently use drugs after having used the following drugs: "Crack" cocaine, Cocaine, and Marijuana. He reports that he does not drink alcohol.  Allergies  Allergen Reactions   Gabapentin Hives   Ibuprofen Other (See Comments)    Stomach  Upset and stomach  ulcers   Zolpidem Tartrate     Other Reaction(s): Not available   Naproxen Other (See Comments)    HALLUCINATIONS    Family History  Problem Relation Age of Onset   Diabetes Mother    Hypertension Mother    Heart attack Mother 62   Hypertension Father    Diabetes Father    Heart attack Father 27   Heart attack Other        mother, father, brother, sister all deceased due to MI   Heart attack Sister    Heart attack Brother    Seizures Brother    Heart failure Other    Colon cancer Neg Hx    Liver disease Neg Hx    Anesthesia problems Neg Hx    Hypotension Neg Hx    Malignant hyperthermia Neg Hx    Pseudochol deficiency Neg Hx    Colon polyps Neg Hx     Prior to Admission medications   Medication Sig Start Date End Date Taking? Authorizing Provider  albuterol (PROVENTIL) (2.5 MG/3ML) 0.083% nebulizer solution Take 3 mLs (2.5 mg total) by nebulization every 6 (six) hours as needed for wheezing or shortness of breath. 07/08/22   Eber Hong, MD  albuterol (VENTOLIN HFA) 108 (90 Base) MCG/ACT inhaler Inhale 2 puffs into the lungs every 4 (four) hours as needed for wheezing or shortness of breath. 07/08/22   Eber Hong, MD  ALPRAZolam Prudy Feeler) 1 MG tablet Take 1 mg by mouth 4 (four) times daily. 08/01/19   [provider]  atorvastatin (LIPITOR) 80 MG tablet Take 80 mg by mouth at bedtime. 12/05/20   [provider]  azelastine (ASTELIN) 0.1 % nasal spray Place 1 spray into both nostrils 2 (two) times daily. 12/29/21   [provider]  beclomethasone (QVAR) 80 MCG/ACT inhaler Inhale 2 puffs into the lungs 2 (two) times daily as needed (respiratory issues.).     [provider]  BELSOMRA 15 MG TABS Take 1 tablet by mouth at bedtime. 04/30/21   [provider]  brimonidine (ALPHAGAN P) 0.1 % SOLN Place 1 drop into the right eye 2 (two) times daily. 12/25/20   [provider]  cephALEXin (KEFLEX) 500 MG capsule Take 1 capsule (500 mg  total) by mouth 4 (four) times daily. 08/20/22   Terrilee Files, MD  cetirizine (ZYRTEC) 10 MG tablet Take 10 mg by mouth daily as needed for allergies.  01/26/16   [provider]  clopidogrel (PLAVIX) 75 MG tablet Take 75 mg by mouth daily.    [provider]  dexlansoprazole (DEXILANT) 60 MG capsule Take 1 capsule (60 mg total) by mouth daily. 07/05/17   Fields, Darleene Cleaver, MD  esomeprazole (NEXIUM) 40 MG capsule Take 40 mg by mouth  daily. 07/06/22   [provider]  Evolocumab (REPATHA SURECLICK) 140 MG/ML SOAJ Take 140 mg by mouth every 14 (fourteen) days. 03/10/22   Antoine Poche, MD  ezetimibe (ZETIA) 10 MG tablet Take 10 mg by mouth daily.    [provider]  fluticasone (FLONASE) 50 MCG/ACT nasal spray Place 2 sprays into both nostrils daily.    [provider]  furosemide (LASIX) 40 MG tablet TAKE 1 TABLET BY MOUTH EVERY DAY AND MAY TAKE ADDITIONAL TABLET AS NEEDED FOR SWELLING OR WEIGHT GAIN 08/03/22   Gaston Islam., NP  glipiZIDE (GLUCOTROL) 10 MG tablet Take 10 mg by mouth daily before breakfast.    [provider]  isosorbide mononitrate (IMDUR) 60 MG 24 hr tablet Take 60 mg by mouth daily.    [provider]  ketoconazole (NIZORAL) 2 % shampoo Apply 1 application topically daily as needed for irritation.  10/23/19   [provider]  latanoprost (XALATAN) 0.005 % ophthalmic solution Place 1 drop into both eyes at bedtime.    [provider]  LINZESS 290 MCG CAPS capsule TAKE 1 CAPSULE(290 MCG) BY MOUTH DAILY BEFORE BREAKFAST Patient taking differently: Take 290 mcg by mouth daily before breakfast. May take 1 additional capsule as needed for constipation 02/04/21   Tiffany Kocher, PA-C  lisinopril (ZESTRIL) 10 MG tablet Take 1 tablet (10 mg total) by mouth daily. 05/21/22   Johnson, Clanford L, MD  lisinopril-hydrochlorothiazide (ZESTORETIC) 10-12.5 MG tablet Take 1 tablet by mouth daily. Patient not  taking: Reported on 07/24/2022    [provider]  magnesium citrate SOLN Take 1 Bottle by mouth as needed for moderate constipation.     [provider]  metFORMIN (GLUCOPHAGE-XR) 500 MG 24 hr tablet Take 500 mg by mouth daily with breakfast.     [provider]  metoprolol tartrate (LOPRESSOR) 50 MG tablet TAKE 1 TABLET(50 MG) BY MOUTH TWICE DAILY 02/04/22   Lyn Records, MD  MOUNJARO 2.5 MG/0.5ML Pen Inject 2.5 mg into the skin once a week. Patient not taking: Reported on 07/08/2022    [provider]  MOVANTIK 25 MG TABS tablet Take 25 mg by mouth every morning. 07/09/22   [provider]  Multiple Vitamin (MULTIVITAMIN WITH MINERALS) TABS tablet Take 1 tablet by mouth daily.    [provider]  NARCAN 4 MG/0.1ML LIQD nasal spray kit Place 1 spray into the nose as needed (opioid overdose).  06/15/17   [provider]  nitroGLYCERIN (NITROSTAT) 0.4 MG SL tablet Place 1 tablet (0.4 mg total) under the tongue every 5 (five) minutes as needed for chest pain. 08/26/21   Johnson, Clanford L, MD  omeprazole (PRILOSEC) 20 MG capsule Take 20 mg by mouth 2 (two) times daily as needed. 07/21/22   [provider]  oxyCODONE-acetaminophen (PERCOCET) 10-325 MG tablet Take 1 tablet by mouth every 4 (four) hours as needed for pain.    [provider]  potassium chloride SA (KLOR-CON) 20 MEQ tablet Take 20 mEq by mouth 2 (two) times daily.    [provider]  predniSONE (DELTASONE) 20 MG tablet Take 2 tablets (40 mg total) by mouth daily. Patient not taking: Reported on 07/24/2022 07/08/22   Eber Hong, MD  sertraline (ZOLOFT) 100 MG tablet Take 100 mg by mouth daily.    [provider]  sildenafil (VIAGRA) 100 MG tablet Take by mouth as needed for erectile dysfunction. 03/11/22   [provider]  sucralfate (  CARAFATE) 1 GM/10ML suspension in the morning, at noon, in the evening, and at bedtime. 09/10/20    [provider]  tamsulosin (FLOMAX) 0.4 MG CAPS capsule Take 1 capsule (0.4 mg total) by mouth daily after breakfast. 01/07/16   Adonis Brook, NP  TRULANCE 3 MG TABS Take 1 tablet by mouth daily.    [provider]  Vitamin D, Ergocalciferol, (DRISDOL) 1.25 MG (50000 UT) CAPS capsule Take 50,000 Units by mouth every 7 (seven) days. Monday    [provider]    Physical Exam: Vitals:   08/21/22 1136 08/21/22 1136 08/21/22 1315  BP:  137/79 120/76  Pulse:  97 90  Resp:  19 18  Temp:  (!) 100.9 F (38.3 C) (!) 103 F (39.4 C)  TempSrc:  Oral Oral  SpO2:  96% 98%  Weight: 103.9 kg    Height: 5\' 10"  (1.778 m)     GENERAL:  A&O x 3, NAD, well developed, cooperative, follows commands HEENT: [place]/AT, No thrush, No icterus, No oral ulcers Neck:  No neck mass, No meningismus, soft, supple CV: RRR, no S3, no S4, no rub, no JVD Lungs: Bibasal rales.  No wheezing.  Good air movement Abd: soft/epigastric tender +BS, nondistended Ext: No edema, no lymphangitis, no cyanosis, no rashes Neuro:  CN II-XII intact, strength 4/5 in RUE, RLE, strength 4/5 LUE, LLE; sensation intact bilateral; no dysmetria; babinski equivocal  Data Reviewed: Data reviewed above in the history  Assessment and Plan: E. coli bacteremia -08/20/2022 blood cultures--gram-negative rods -Multiplex PCR shows E. coli without resistance -Continue ceftriaxone 2 g daily -Source is pyelonephritis  Pyelonephritis -08/20/2022 UA 21-50 WBC -Continue ceftriaxone pending culture data  Dysphagia/upper abdominal pain -This appears to be a chronic issue -He had been in the process of being worked up by GI at [facility] as discussed above -Patient is requesting GI evaluation -Consult GI -Restart pantoprazole twice daily  Chest pain/coronary artery disease -longstanding history of chest pain (previously underwent CABG in 2002 but had an atretic LIMA by follow-up catheterization in 2013 and Coronary CT in  2021 showed a calcium score of 0). -04/19/2022 Myoview--low risk -Cycle troponins -05/19/2022 echo EF 50-55%, no WMA, grade 1 DD, normal RV function -Imdur has been discontinued March 2024 secondary to syncope  Chronic HFpEF --05/19/2022 echo EF 50-55%, no WMA, grade 1 DD, normal RV function -Clinically compensated -Holding furosemide temporarily  Mixed hyperlipidemia -Continue statin  Essential hypertension -Holding lisinopril and metoprolol temporarily  Diabetes mellitus type 2 -Holding metformin and glipizide -05/19/2022 hemoglobin A1c 5.9 -Repeat A1c -Mounjaro stopped about a month ago  Opioid dependence -PDMP reviewed -Percocet 10/325, #168, refill 08/18/22 -xanax 1 mg, #120, refill 07/29/22  Anxiety/depression -Continue Xanax and sertraline  Obesity -BMI 32.86 -lifestyle modification   Advance Care Planning: FULL  Consults: GI  Family Communication: none  Severity of Illness: The appropriate patient status for this patient is INPATIENT. Inpatient status is judged to be reasonable and necessary in order to provide the required intensity of service to ensure the patient's safety. The patient's presenting symptoms, physical exam findings, and initial radiographic and laboratory data in the context of their chronic comorbidities is felt to place them at high risk for further clinical deterioration. Furthermore, it is not anticipated that the patient will be medically stable for discharge from the hospital within 2 midnights of admission.   * I certify that at the point of admission it is my clinical judgment that the patient will require inpatient hospital care spanning  beyond 2 midnights from the point of admission due to high intensity of service, high risk for further deterioration and high frequency of surveillance required.*  Author: Catarina Hartshorn, MD 08/21/2022 2:10 PM  For on call review [email or web address].

## 2022-08-21 NOTE — Consult Note (Signed)
  Referring Provider: No ref. provider found Primary Care Physician:  Avbuere, Edwin, MD Primary Gastroenterologist:  Dr. Rajan - [facility]  Reason for Consultation: Dysphagia  HPI: 62-year-old gentleman with multiple co-morbidities including CAD, COPD heart failure hypertension, hyperlipidemia, diabetes presented with recent upper abdominal and flank pain.  Ultimately, diagnosed with pyelonephritis and urosepsis.  Was called back to the hospital yesterday because of positive blood cultures.  Has been started on antibiotics.   GI consulted for several month history of insidiously recurrent esophageal dysphagia.  He more recently has been followed by Dr. Rajan with [facility].  In fact, an EGD was attempted on 08/03/2022 which revealed retained gastric contents.  The procedure was aborted.  He was to have an upper GI series on 6/27 but because of his recent acute illness this was not done.  Past medical history significant for recurrent esophageal dysphagia he has been dilated multiple times in the past  - last session here was in 2022.  Dr. Carver found distal esophageal stenosis.  He was dilated to 20 mm with TTS balloons.  No H. pylori on biopsies.  He noted significant improvement in swallowing until the last few months.  He states he has been taking omeprazole 20 mg daily with good control of reflux symptoms.  He has a history of chronic constipation has been taking  Linzess, Trulance and Movantik according to the record. He is on Plavix.  He is not anticoagulated.  He is on multiple medications including chronic opioid therapy.  He notes recent diarrhea.  A GIP and C. difficile toxin assay are pending this admission.  It is also notable he was taking Mounjaro.  However he states he has not taken that medication in a good 3 to 4 weeks.    Recent CT indicated possible Cholelithiasis and pyelonephritis. Past Medical History:  Diagnosis Date   (HFpEF) heart failure with preserved ejection fraction  (HCC)    Anemia    Anxiety    Asthma    Bipolar disorder (HCC)    Chronic back pain    [facility] in [place]   Chronic bronchitis    COPD (chronic obstructive pulmonary disease) (HCC)    Coronary artery disease 2002   CABG (LIMA to LAD)   Depression    DVT (deep venous thrombosis) (HCC) ~ 2005   LLE   Frequency of urination    GERD (gastroesophageal reflux disease)    Headache(784.0)    History of seizures    Last 2011   HNP (herniated nucleus pulposus), cervical    Hyperlipidemia    Hypertension    MI (myocardial infarction) (HCC)    Neuropathy    NSAID-induced gastric ulcer    Rheumatoid arthritis (HCC)    Tonsillitis, chronic    Dr. Steven Dennis in [place]   Type II diabetes mellitus (HCC)    Wears glasses     Past Surgical History:  Procedure Laterality Date   ANTERIOR CERVICAL DECOMP/DISCECTOMY FUSION N/A 11/05/2016   Procedure: Anterior Cervical Discectomy and Fusion - Cervical three-Cervical four;  Surgeon: Pool, Henry, MD;  Location: [facility];  Service: Neurosurgery;  Laterality: N/A;   BACK SURGERY     x3   BALLOON DILATION N/A 12/04/2019   Procedure: BALLOON DILATION;  Surgeon: Carver, Charles K, DO;  Location: [facility];  Service: Endoscopy;  Laterality: N/A;   BIOPSY N/A 05/30/2012   Procedure: BIOPSY;  Surgeon: Sandi L Fields, MD;  Location: [facility];  Service: Endoscopy;  Laterality: N/A;     BIOPSY  03/02/2016   Procedure: BIOPSY;  Surgeon: Sandi L Fields, MD;  [facility]: [facility];  Service: Endoscopy;;  gastric   BIOPSY  12/04/2019   Procedure: BIOPSY;  Surgeon: Carver, Charles K, DO;  [facility]: [facility];  Service: Endoscopy;;   CARDIAC CATHETERIZATION  "several"   CARPAL TUNNEL RELEASE Bilateral    CATARACT EXTRACTION W/PHACO Right 06/15/2016   Procedure: CATARACT EXTRACTION PHACO AND INTRAOCULAR LENS PLACEMENT (IOC);  Surgeon: Mark Shapiro, MD;  Location: [facility];  Service: Ophthalmology;  Laterality: Right;  CDE: 4.64   CATARACT  EXTRACTION W/PHACO Left 07/13/2016   Procedure: CATARACT EXTRACTION PHACO AND INTRAOCULAR LENS PLACEMENT (IOC);  Surgeon: Shapiro, Mark, MD;  Location: [facility];  Service: Ophthalmology;  Laterality: Left;  CDE: 3.15   COLONOSCOPY  12/28/2010   SLF: (MAC)Internal hemorrhoids/four small colon polyps tubular adenomas. per SLF: colonoscopy 2022   COLONOSCOPY WITH PROPOFOL N/A 07/05/2017   Surgeon: Fields, Sandi L, MD; tubular adenoma, hyperplastic polyp, submucosal nodule at hepatic flexure consistent with lipoma on biopsy, diverticulosis and rectosigmoid and sigmoid colon, rectal bleeding due to large internal hemorrhoids.  Recommended repeat in 3 years due to history of polyps and oily film on scope.   CORONARY ARTERY BYPASS GRAFT  2002   3 vessels   ESOPHAGOGASTRODUODENOSCOPY N/A 05/30/2012   SLF: UNCONTROLLED GERD DUE TO LIFESTYLE CHOICE/WEIGHT GAIN/MILD Non-erosive gastritis   ESOPHAGOGASTRODUODENOSCOPY (EGD) WITH PROPOFOL N/A 03/02/2016   Dr. fields: Esophagus appeared normal, impaired dilation performed, patchy inflammation with edema and erythema of the entire stomach., Biopsy with H pylori, patient completed Pylera.    ESOPHAGOGASTRODUODENOSCOPY (EGD) WITH PROPOFOL N/A 12/04/2019   Surgeon: Carver, Charles K, DO; benign appearing esophageal stenosis dilated, gastritis biopsied (mild reactive gastropathy, gastritis, negative H. pylori), normal duodenum.   FRACTURE SURGERY     LEFT HEART CATHETERIZATION WITH CORONARY ANGIOGRAM N/A 08/31/2011   Procedure: LEFT HEART CATHETERIZATION WITH CORONARY ANGIOGRAM;  Surgeon: Jagadeesh R Ganji, MD;  Location: [facility];  Service: Cardiovascular;  Laterality: N/A;   LUMBAR DISC SURGERY     "L4-5; Dr. Poole"   LUMBAR LAMINECTOMY/DECOMPRESSION MICRODISCECTOMY Right 03/06/2019   Procedure: MICRODISCECTOMY EXTRAFORAMINAL LUMBAR THREE - LUMBAR FOUR RIGHT;  Surgeon: Pool, Henry, MD;  Location: [facility];  Service: Neurosurgery;  Laterality: Right;  MICRODISCECTOMY  EXTRAFORAMINAL LUMBAR THREE - LUMBAR FOUR RIGHT   MULTIPLE TOOTH EXTRACTIONS     ORIF MANDIBULAR FRACTURE N/A 12/19/2015   Procedure: OPEN REDUCTION INTERNAL FIXATION (ORIF) MANDIBULAR FRACTURE;  Surgeon: Jefry Rosen, MD;  Location: [facility];  Service: ENT;  Laterality: N/A;   PATELLA FRACTURE SURGERY Left 1976   plate to knee cap from accident   POLYPECTOMY  07/05/2017   Procedure: POLYPECTOMY;  Surgeon: Fields, Sandi L, MD;  [facility];  Service: Endoscopy;;  colon   SAVORY DILATION  12/28/2010   SLF:(MAC)J-shaped stomach/nodular mocosa in the distal esophagus/empiric dilation 16mm   SAVORY DILATION N/A 03/02/2016   Procedure: SAVORY DILATION;  Surgeon: Sandi L Fields, MD;  [facility];  Service: Endoscopy;  Laterality: N/A;    Prior to Admission medications   Medication Sig Start Date End Date Taking? Authorizing Provider  albuterol (PROVENTIL) (2.5 MG/3ML) 0.083% nebulizer solution Take 3 mLs (2.5 mg total) by nebulization every 6 (six) hours as needed for wheezing or shortness of breath. 07/08/22   Miller, Brian, MD  albuterol (VENTOLIN HFA) 108 (90 Base) MCG/ACT inhaler Inhale 2 puffs into the lungs every 4 (four) hours as needed for wheezing   or shortness of breath. 07/08/22   Miller, Brian, MD  ALPRAZolam (XANAX) 1 MG tablet Take 1 mg by mouth 4 (four) times daily. 08/01/19   [provider]  atorvastatin (LIPITOR) 80 MG tablet Take 80 mg by mouth at bedtime. 12/05/20   [provider]  azelastine (ASTELIN) 0.1 % nasal spray Place 1 spray into both nostrils 2 (two) times daily. 12/29/21   [provider]  beclomethasone (QVAR) 80 MCG/ACT inhaler Inhale 2 puffs into the lungs 2 (two) times daily as needed (respiratory issues.).     [provider]  BELSOMRA 15 MG TABS Take 1 tablet by mouth at bedtime. 04/30/21   [provider]  brimonidine (ALPHAGAN P) 0.1 % SOLN Place 1 drop into the right eye 2 (two) times daily. 12/25/20    [provider]  cephALEXin (KEFLEX) 500 MG capsule Take 1 capsule (500 mg total) by mouth 4 (four) times daily. 08/20/22   Butler, Michael C, MD  cetirizine (ZYRTEC) 10 MG tablet Take 10 mg by mouth daily as needed for allergies.  01/26/16   [provider]  clopidogrel (PLAVIX) 75 MG tablet Take 75 mg by mouth daily.    [provider]  dexlansoprazole (DEXILANT) 60 MG capsule Take 1 capsule (60 mg total) by mouth daily. 07/05/17   Fields, Sandi L, MD  esomeprazole (NEXIUM) 40 MG capsule Take 40 mg by mouth daily. 07/06/22   [provider]  Evolocumab (REPATHA SURECLICK) 140 MG/ML SOAJ Take 140 mg by mouth every 14 (fourteen) days. 03/10/22   Branch, Jonathan F, MD  ezetimibe (ZETIA) 10 MG tablet Take 10 mg by mouth daily.    [provider]  fluticasone (FLONASE) 50 MCG/ACT nasal spray Place 2 sprays into both nostrils daily.    [provider]  furosemide (LASIX) 40 MG tablet TAKE 1 TABLET BY MOUTH EVERY DAY AND MAY TAKE ADDITIONAL TABLET AS NEEDED FOR SWELLING OR WEIGHT GAIN 08/03/22   Dick, Ernest H Jr., NP  glipiZIDE (GLUCOTROL) 10 MG tablet Take 10 mg by mouth daily before breakfast.    [provider]  isosorbide mononitrate (IMDUR) 60 MG 24 hr tablet Take 60 mg by mouth daily.    [provider]  ketoconazole (NIZORAL) 2 % shampoo Apply 1 application topically daily as needed for irritation.  10/23/19   [provider]  latanoprost (XALATAN) 0.005 % ophthalmic solution Place 1 drop into both eyes at bedtime.    [provider]  LINZESS 290 MCG CAPS capsule TAKE 1 CAPSULE(290 MCG) BY MOUTH DAILY BEFORE BREAKFAST Patient taking differently: Take 290 mcg by mouth daily before breakfast. May take 1 additional capsule as needed for constipation 02/04/21   Lewis, Leslie S, PA-C  lisinopril (ZESTRIL) 10 MG tablet Take 1 tablet (10 mg total) by mouth daily. 05/21/22   Johnson, Clanford L, MD   lisinopril-hydrochlorothiazide (ZESTORETIC) 10-12.5 MG tablet Take 1 tablet by mouth daily. Patient not taking: Reported on 07/24/2022    [provider]  magnesium citrate SOLN Take 1 Bottle by mouth as needed for moderate constipation.     [provider]  metFORMIN (GLUCOPHAGE-XR) 500 MG 24 hr tablet Take 500 mg by mouth daily with breakfast.     [provider]  metoprolol tartrate (LOPRESSOR) 50 MG tablet TAKE 1 TABLET(50 MG) BY MOUTH TWICE DAILY 02/04/22   Smith, Henry W, MD  MOUNJARO 2.5 MG/0.5ML Pen Inject 2.5 mg into the skin once a week. Patient not taking: Reported   on 07/08/2022    [provider]  MOVANTIK 25 MG TABS tablet Take 25 mg by mouth every morning. 07/09/22   [provider]  Multiple Vitamin (MULTIVITAMIN WITH MINERALS) TABS tablet Take 1 tablet by mouth daily.    [provider]  NARCAN 4 MG/0.1ML LIQD nasal spray kit Place 1 spray into the nose as needed (opioid overdose).  06/15/17   [provider]  nitroGLYCERIN (NITROSTAT) 0.4 MG SL tablet Place 1 tablet (0.4 mg total) under the tongue every 5 (five) minutes as needed for chest pain. 08/26/21   Johnson, Clanford L, MD  omeprazole (PRILOSEC) 20 MG capsule Take 20 mg by mouth 2 (two) times daily as needed. 07/21/22   [provider]  oxyCODONE-acetaminophen (PERCOCET) 10-325 MG tablet Take 1 tablet by mouth every 4 (four) hours as needed for pain.    [provider]  potassium chloride SA (KLOR-CON) 20 MEQ tablet Take 20 mEq by mouth 2 (two) times daily.    [provider]  predniSONE (DELTASONE) 20 MG tablet Take 2 tablets (40 mg total) by mouth daily. Patient not taking: Reported on 07/24/2022 07/08/22   Miller, Brian, MD  sertraline (ZOLOFT) 100 MG tablet Take 100 mg by mouth daily.    [provider]  sildenafil (VIAGRA) 100 MG tablet Take by mouth as needed for erectile dysfunction. 03/11/22   [provider]  sucralfate  (CARAFATE) 1 GM/10ML suspension in the morning, at noon, in the evening, and at bedtime. 09/10/20   [provider]  tamsulosin (FLOMAX) 0.4 MG CAPS capsule Take 1 capsule (0.4 mg total) by mouth daily after breakfast. 01/07/16   Agustin, Sheila, NP  TRULANCE 3 MG TABS Take 1 tablet by mouth daily.    [provider]  Vitamin D, Ergocalciferol, (DRISDOL) 1.25 MG (50000 UT) CAPS capsule Take 50,000 Units by mouth every 7 (seven) days. Monday    [provider]    Current Facility-Administered Medications  Medication Dose Route Frequency Provider Last Rate Last Admin   0.9 % NaCl with KCl 20 mEq/ L  infusion   Intravenous Continuous Tat, David, MD 75 mL/hr at 08/21/22 1551 New Bag at 08/21/22 1551   acetaminophen (TYLENOL) tablet 650 mg  650 mg Oral Q6H PRN Tat, David, MD       Or   acetaminophen (TYLENOL) suppository 650 mg  650 mg Rectal Q6H PRN Tat, David, MD       albuterol (PROVENTIL) (2.5 MG/3ML) 0.083% nebulizer solution 2.5 mg  2.5 mg Nebulization Q6H PRN Tat, David, MD       ALPRAZolam (XANAX) tablet 1 mg  1 mg Oral QID PRN Tat, David, MD       [START ON 08/22/2022] atorvastatin (LIPITOR) tablet 80 mg  80 mg Oral Daily Tat, David, MD       [START ON 08/22/2022] cefTRIAXone (ROCEPHIN) 2 g in sodium chloride 0.9 % 100 mL IVPB  2 g Intravenous Q24H Tat, David, MD       enoxaparin (LOVENOX) injection 40 mg  40 mg Subcutaneous Q24H Tat, David, MD       [START ON 08/22/2022] ezetimibe (ZETIA) tablet 10 mg  10 mg Oral Daily Tat, David, MD       [START ON 08/22/2022] fluticasone (FLONASE) 50 MCG/ACT nasal spray 2 spray  2 spray Each Nare Daily Tat, David, MD       insulin aspart (novoLOG) injection 0-9 Units  0-9 Units Subcutaneous TID WC Tat, David, MD       [  START ON 08/22/2022] linaclotide (LINZESS) capsule 290 mcg  290 mcg Oral QAC breakfast Tat, David, MD       metoprolol tartrate (LOPRESSOR) tablet 25 mg  25 mg Oral BID Tat, David, MD       oxyCODONE-acetaminophen  (PERCOCET/ROXICET) 5-325 MG per tablet 1 tablet  1 tablet Oral Q4H PRN Tat, David, MD       And   oxyCODONE (Oxy IR/ROXICODONE) immediate release tablet 5 mg  5 mg Oral Q4H PRN Tat, David, MD       pantoprazole (PROTONIX) EC tablet 40 mg  40 mg Oral BID Tat, David, MD       prochlorperazine (COMPAZINE) injection 5 mg  5 mg Intravenous Q6H PRN Tat, David, MD       [START ON 08/22/2022] sertraline (ZOLOFT) tablet 100 mg  100 mg Oral Daily Tat, David, MD       [START ON 08/22/2022] tamsulosin (FLOMAX) capsule 0.4 mg  0.4 mg Oral QPC breakfast Tat, David, MD        Allergies as of 08/21/2022 - Review Complete 08/21/2022  Allergen Reaction Noted   Gabapentin Hives    Ibuprofen Other (See Comments)    Zolpidem tartrate  04/13/2022   Naproxen Other (See Comments)     Family History  Problem Relation Age of Onset   Diabetes Mother    Hypertension Mother    Heart attack Mother 61   Hypertension Father    Diabetes Father    Heart attack Father 58   Heart attack Other        mother, father, brother, sister all deceased due to MI   Heart attack Sister    Heart attack Brother    Seizures Brother    Heart failure Other    Colon cancer Neg Hx    Liver disease Neg Hx    Anesthesia problems Neg Hx    Hypotension Neg Hx    Malignant hyperthermia Neg Hx    Pseudochol deficiency Neg Hx    Colon polyps Neg Hx     Social History   Socioeconomic History   Marital status: Single    Spouse name: Not on file   Number of children: 2   Years of education: Not on file   Highest education level: Not on file  Occupational History   Occupation: disabled    Employer: NOT EMPLOYED  Tobacco Use   Smoking status: Never   Smokeless tobacco: Never  Vaping Use   Vaping Use: Never used  Substance and Sexual Activity   Alcohol use: No    Alcohol/week: 0.0 standard drinks of alcohol   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)    Sexual activity: Not Currently  Other Topics Concern   Not on file  Social History Narrative   Lives w/ son-23/22   Social Determinants of Health   Financial Resource Strain: Not on file  Food Insecurity: No Food Insecurity (05/18/2022)   Hunger Vital Sign    Worried About Running Out of Food in the Last Year: Never true    Ran Out of Food in the Last Year: Never true  Transportation Needs: No Transportation Needs (05/18/2022)   PRAPARE - Transportation    Lack of Transportation (Medical): No    Lack of Transportation (Non-Medical): No  Physical Activity: Not on file  Stress: Not on file  Social Connections: Not on file  Intimate Partner   Violence: Not At Risk (05/18/2022)   Humiliation, Afraid, Rape, and Kick questionnaire    Fear of Current or Ex-Partner: No    Emotionally Abused: No    Physically Abused: No    Sexually Abused: No    Review of Systems:  As in history of present illness  Physical Exam: Vital signs in last 24 hours: Temp:  [98.9 F (37.2 C)-103 F (39.4 C)] 99.3 F (37.4 C) (06/29 1436) Pulse Rate:  [88-99] 88 (06/29 1436) Resp:  [17-34] 17 (06/29 1436) BP: (109-137)/(54-98) 115/61 (06/29 1436) SpO2:  [95 %-100 %] 100 % (06/29 1436) Weight:  [103.9 kg] 103.9 kg (06/29 1136)   General:   Alert, very pleasant conversant individual in no acute distress. pleasant and cooperative in NAD Neck:  Supple; no masses or thyromegaly. Lungs:  Clear throughout to auscultation.   No wheezes, crackles, or rhonchi. No acute distress. Heart:  Regular rate and rhythm; no murmurs, clicks, rubs,  or gallops. Abdomen:  Soft, nontender and nondistended. No masses, hepatosplenomegaly or hernias noted. Normal bowel sounds, without guarding, and without rebound.    Intake/Output from previous day: No intake/output data recorded. Intake/Output this shift: No intake/output data recorded.  Lab Results: Recent Labs    08/20/22 1523 08/21/22 1217  WBC 10.2 8.3  HGB 12.1*  11.3*  HCT 35.2* 32.6*  PLT 213 152   BMET Recent Labs    08/20/22 1523 08/21/22 1217  NA 131* 131*  K 3.0* 3.7  CL 93* 95*  CO2 27 25  GLUCOSE 154* 109*  BUN 9 11  CREATININE 1.17 1.29*  CALCIUM 9.1 8.4*   LFT Recent Labs    08/21/22 1217  PROT 7.9  ALBUMIN 3.6  AST 33  ALT 21  ALKPHOS 77  BILITOT 1.9*   PT/INR No results for input(s): "LABPROT", "INR" in the last 72 hours. Hepatitis Panel No results for input(s): "HEPBSAG", "HCVAB", "HEPAIGM", "HEPBIGM" in the last 72 hours. C-Diff No results for input(s): "CDIFFTOX" in the last 72 hours.  Studies/Results: CT CHEST ABDOMEN PELVIS W CONTRAST  Result Date: 08/20/2022 CLINICAL DATA:  Sepsis, fever EXAM: CT CHEST, ABDOMEN, AND PELVIS WITH CONTRAST TECHNIQUE: Multidetector CT imaging of the chest, abdomen and pelvis was performed following the standard protocol during bolus administration of intravenous contrast. RADIATION DOSE REDUCTION: This exam was performed according to the departmental dose-optimization program which includes automated exposure control, adjustment of the mA and/or kV according to patient size and/or use of iterative reconstruction technique. CONTRAST:  100mL OMNIPAQUE IOHEXOL 300 MG/ML  SOLN COMPARISON:  CT chest done on 01/02/2022 FINDINGS: CT CHEST FINDINGS Cardiovascular: There is evidence of previous coronary bypass surgery. There is vascular stent in proximal course of left subclavian artery. Mediastinum/Nodes: No significant lymphadenopathy is seen. Lungs/Pleura: Small linear densities are seen in left lower lung fields suggesting scarring or subsegmental atelectasis. There is no focal pulmonary consolidation. There is no pleural effusion or pneumothorax. Musculoskeletal: No acute findings are seen. Degenerative changes are noted in lumbar spine with encroachment of neural foramina at multiple levels, more so at L5-S1 level. CT ABDOMEN PELVIS FINDINGS Hepatobiliary: No focal abnormalities are seen in  liver. There is no dilation of bile ducts. There is increased density in gallbladder lumen. There is no wall thickening in gallbladder. Pancreas: No focal abnormalities are seen. Spleen: Unremarkable. Adrenals/Urinary Tract: Adrenals are unremarkable. There is no hydronephrosis. There are patchy areas of decreased enhancement in right renal cortex. Right kidney slightly larger than left. There is mild right   perinephric stranding. There are no renal or ureteral stones. Urinary bladder is unremarkable. Stomach/Bowel: Small hiatal hernia is seen. Small bowel loops are not dilated. There is fluid in the lumen of small bowel loops and colon which may be a normal variation or suggest nonspecific enteritis. There is no significant wall thickening in the small bowel loops and colon. The appendix is not seen. There is no pericolic stranding. Vascular/Lymphatic: Unremarkable Reproductive: Unremarkable. Other: There is no ascites or pneumoperitoneum. Small umbilical hernia containing fat is seen. Musculoskeletal: Degenerative changes are noted with spinal stenosis and encroachment of neural foramina at multiple levels in lower lumbar spine. IMPRESSION: There are patchy areas of decreased cortical enhancement in right kidney suggesting acute pyelonephritis. There is no hydronephrosis. There are no renal or ureteral stones. There is no focal pulmonary consolidation. There is no pleural effusion or pneumothorax. Previous coronary bypass surgery. Vascular stent is noted in the proximal course of left subclavian artery. There is no evidence of intestinal obstruction. There is fluid in the lumen of small bowel loops and colon which may be a normal variation or suggest nonspecific enteritis. There is no bowel wall thickening or significant dilation. There is subtle increased density in the lumen of gallbladder suggesting possible gallbladder stones. There are no signs of acute cholecystitis. There is no dilation of bile ducts.  Follow-up gallbladder sonogram in nonemergent setting may be considered. Small hiatal hernia.  Lumbar spondylosis. Electronically Signed   By: Palani  Rathinasamy M.D.   On: 08/20/2022 20:42   DG Chest 2 View  Result Date: 08/20/2022 CLINICAL DATA:  Shortness of breath EXAM: CHEST - 2 VIEW COMPARISON:  07/24/2022 FINDINGS: Post sternotomy changes. Patchy atelectasis or minimal infiltrates at the bases. Normal cardiac size. No pneumothorax. Stent superior to the aortic arch. Scattered fluid levels over the upper abdomen. Air collection in the epigastric area. IMPRESSION: Patchy atelectasis or minimal infiltrates at the bases. Scattered air-fluid levels in the abdomen incompletely assessed. Focal air collection in the epigastrium probably air distended bowel, but recommend dedicated abdominal radiographs for further assessment. Electronically Signed   By: Kim  Fujinaga M.D.   On: 08/20/2022 15:34    Impression: 62-year-old gentleman with multiple comorbidities admitted to the hospital with right pyelonephritis and urosepsis.  He is on IV antibiotics.  GI consult for esophageal dysphagia. He has a known history of esophageal stenosis dilated at our institution previously.  He states dilation previously improved his dysphagia significantly until insidious recurrence recently.  Attempted EGD earlier this month was thwarted by presence of retained gastric contents.  Previously on Mounjaro but none in about a month.  He is on multiple medications including including opioids.  Given his diabetes and chronic narcotic therapy he may have chronic issues with delayed gastric emptying.  Recent diarrhea for which stool studies have been ordered.  I note he has not been on multiple agents for constipation recently.  They are being held.  Patient would benefit from upper endoscopic evaluation with esophageal dilation as feasible and appropriate while he is here.  Recommendations:  -Depending on clinical  progression, will tentatively offer him an EGD with esophageal dilation.  I have concerns about delayed gastric emptying.  Therefore, will start him on clear liquid diet on June 30. The risks, benefits, limitations, alternatives and imponderables have been reviewed with the patient. Potential for esophageal dilation, biopsy, etc. have also been reviewed.  Questions have been answered. He is agreeable.  -Will also utilize IV erythromycin as a prokinetic agent just   prior to his endoscopy.  - Follow-up on pending stool studies.  -I'll re-assess tomorrow.  -Further recommendations to follow.      Notice:  This dictation was prepared with Dragon dictation along with smaller phrase technology. Any transcriptional errors that result from this process are unintentional and may not be corrected upon review.  

## 2022-08-21 NOTE — ED Triage Notes (Signed)
Pt ambulatory to ER, was seen in ED, received call this AM that he needed to return d/t abnormal labs.

## 2022-08-21 NOTE — ED Notes (Signed)
Verified with pharmacy that 2 g Rocephin can be given now, even though 1 g Rocephin was given last night 6/28 at 2219.

## 2022-08-21 NOTE — ED Notes (Signed)
MD Zammit notified of temp 103, see new orders.

## 2022-08-21 NOTE — H&P (View-Only) (Signed)
Referring Provider: No ref. provider found Primary Care Physician:  Fleet Contras, MD Primary Gastroenterologist:  Dr. Carmine Savoy  Reason for Consultation: Dysphagia  HPI: 62 year old gentleman with multiple co-morbidities including CAD, COPD heart failure hypertension, hyperlipidemia, diabetes presented with recent upper abdominal and flank pain.  Ultimately, diagnosed with pyelonephritis and urosepsis.  Was called back to the hospital yesterday because of positive blood cultures.  Has been started on antibiotics.   GI consulted for several month history of insidiously recurrent esophageal dysphagia.  He more recently has been followed by Dr. Glean Hess with [facility].  In fact, an EGD was attempted on 08/03/2022 which revealed retained gastric contents.  The procedure was aborted.  He was to have an upper GI series on 6/27 but because of his recent acute illness this was not done.  Past medical history significant for recurrent esophageal dysphagia he has been dilated multiple times in the past  - last session here was in 2022.  Dr. Marletta Lor found distal esophageal stenosis.  He was dilated to 20 mm with TTS balloons.  No H. pylori on biopsies.  He noted significant improvement in swallowing until the last few months.  He states he has been taking omeprazole 20 mg daily with good control of reflux symptoms.  He has a history of chronic constipation has been taking  Linzess, Trulance and Movantik according to the record. He is on Plavix.  He is not anticoagulated.  He is on multiple medications including chronic opioid therapy.  He notes recent diarrhea.  A GIP and C. difficile toxin assay are pending this admission.  It is also notable he was taking Mounjaro.  However he states he has not taken that medication in a good 3 to 4 weeks.    Recent CT indicated possible Cholelithiasis and pyelonephritis. Past Medical History:  Diagnosis Date   (HFpEF) heart failure with preserved ejection fraction  (HCC)    Anemia    Anxiety    Asthma    Bipolar disorder (HCC)    Chronic back pain    [facility] in [place]   Chronic bronchitis    COPD (chronic obstructive pulmonary disease) (HCC)    Coronary artery disease 2002   CABG (LIMA to LAD)   Depression    DVT (deep venous thrombosis) (HCC) ~ 2005   LLE   Frequency of urination    GERD (gastroesophageal reflux disease)    Headache(784.0)    History of seizures    Last 2011   HNP (herniated nucleus pulposus), cervical    Hyperlipidemia    Hypertension    MI (myocardial infarction) (HCC)    Neuropathy    NSAID-induced gastric ulcer    Rheumatoid arthritis (HCC)    Tonsillitis, chronic    Dr. [name] in [place]   Type II diabetes mellitus [facility])    Wears glasses     Past Surgical History:  Procedure Laterality Date   ANTERIOR CERVICAL DECOMP/DISCECTOMY FUSION N/A 11/05/2016   Procedure: Anterior Cervical Discectomy and Fusion - Cervical three-Cervical four;  Surgeon: Julio Sicks, MD;  Location: [facility] OR;  Service: Neurosurgery;  Laterality: N/A;   BACK SURGERY     x3   BALLOON DILATION N/A 12/04/2019   Procedure: BALLOON DILATION;  Surgeon: Lanelle Bal, DO;  Location: [facility];  Service: Endoscopy;  Laterality: N/A;   BIOPSY N/A 05/30/2012   Procedure: BIOPSY;  Surgeon: West Bali, MD;  Location: [facility] ORS;  Service: Endoscopy;  Laterality: N/A;  BIOPSY  03/02/2016   Procedure: BIOPSY;  Surgeon: West Bali, MD;  Location: [facility];  Service: Endoscopy;;  gastric   BIOPSY  12/04/2019   Procedure: BIOPSY;  Surgeon: Lanelle Bal, DO;  [facility]: [facility];  Service: Endoscopy;;   CARDIAC CATHETERIZATION  "several"   CARPAL TUNNEL RELEASE Bilateral    CATARACT EXTRACTION W/PHACO Right 06/15/2016   Procedure: CATARACT EXTRACTION PHACO AND INTRAOCULAR LENS PLACEMENT (IOC);  Surgeon: Jethro Bolus, MD;  Location: [facility] OR[facility];  Service: Ophthalmology;  Laterality: Right;  CDE: 4.64   CATARACT  EXTRACTION W/PHACO Left 07/13/2016   Procedure: CATARACT EXTRACTION PHACO AND INTRAOCULAR LENS PLACEMENT (IOC);  Surgeon: Jethro Bolus, MD;  Location: [facility];  Service: Ophthalmology;  Laterality: Left;  CDE: 3.15   COLONOSCOPY  12/28/2010   SLF: (MAC)Internal hemorrhoids/four small colon polyps tubular adenomas. per SLF: colonoscopy 2022   COLONOSCOPY WITH PROPOFOL N/A 07/05/2017   Surgeon: West Bali, MD; tubular adenoma, hyperplastic polyp, submucosal nodule at hepatic flexure consistent with lipoma on biopsy, diverticulosis and rectosigmoid and sigmoid colon, rectal bleeding due to large internal hemorrhoids.  Recommended repeat in 3 years due to history of polyps and oily film on scope.   CORONARY ARTERY BYPASS GRAFT  2002   3 vessels   ESOPHAGOGASTRODUODENOSCOPY N/A 05/30/2012   SLF: UNCONTROLLED GERD DUE TO LIFESTYLE CHOICE/WEIGHT GAIN/MILD Non-erosive gastritis   ESOPHAGOGASTRODUODENOSCOPY (EGD) WITH PROPOFOL N/A 03/02/2016   Dr. [name]: Esophagus appeared normal, impaired dilation performed, patchy inflammation with edema and erythema of the entire stomach., Biopsy with H pylori, patient completed Pylera.    ESOPHAGOGASTRODUODENOSCOPY (EGD) WITH PROPOFOL N/A 12/04/2019   Surgeon: Lanelle Bal, DO; benign appearing esophageal stenosis dilated, gastritis biopsied (mild reactive gastropathy, gastritis, negative H. pylori), normal duodenum.   FRACTURE SURGERY     LEFT HEART CATHETERIZATION WITH CORONARY ANGIOGRAM N/A 08/31/2011   Procedure: LEFT HEART CATHETERIZATION WITH CORONARY ANGIOGRAM;  Surgeon: [name], MD;  Location: [facility] CATH LAB;  Service: Cardiovascular;  Laterality: N/A;   LUMBAR DISC SURGERY     "L4-5; Dr. Dutch Quint"   LUMBAR LAMINECTOMY/DECOMPRESSION MICRODISCECTOMY Right 03/06/2019   Procedure: MICRODISCECTOMY EXTRAFORAMINAL LUMBAR THREE - LUMBAR FOUR RIGHT;  Surgeon: Julio Sicks, MD;  Location: [facility] OR;  Service: Neurosurgery;  Laterality: Right;  MICRODISCECTOMY  EXTRAFORAMINAL LUMBAR THREE - LUMBAR FOUR RIGHT   MULTIPLE TOOTH EXTRACTIONS     ORIF MANDIBULAR FRACTURE N/A 12/19/2015   Procedure: OPEN REDUCTION INTERNAL FIXATION (ORIF) MANDIBULAR FRACTURE;  Surgeon: [name], MD;  Location: [facility] OR;  Service: ENT;  Laterality: N/A;   PATELLA FRACTURE SURGERY Left 1976   plate to knee cap from accident   POLYPECTOMY  07/05/2017   Procedure: POLYPECTOMY;  Surgeon: West Bali, MD;  [facility];  Service: Endoscopy;;  colon   SAVORY DILATION  12/28/2010   SLF:(MAC)J-shaped stomach/nodular mocosa in the distal esophagus/empiric dilation 16mm   SAVORY DILATION N/A 03/02/2016   Procedure: SAVORY DILATION;  Surgeon: West Bali, MD;  [facility];  Service: Endoscopy;  Laterality: N/A;    Prior to Admission medications   Medication Sig Start Date End Date Taking? Authorizing Provider  albuterol (PROVENTIL) (2.5 MG/3ML) 0.083% nebulizer solution Take 3 mLs (2.5 mg total) by nebulization every 6 (six) hours as needed for wheezing or shortness of breath. 07/08/22   Eber Hong, MD  albuterol (VENTOLIN HFA) 108 (90 Base) MCG/ACT inhaler Inhale 2 puffs into the lungs every 4 (four) hours as needed for wheezing  or shortness of breath. 07/08/22   Eber Hong, MD  ALPRAZolam Prudy Feeler) 1 MG tablet Take 1 mg by mouth 4 (four) times daily. 08/01/19   [provider]  atorvastatin (LIPITOR) 80 MG tablet Take 80 mg by mouth at bedtime. 12/05/20   [provider]  azelastine (ASTELIN) 0.1 % nasal spray Place 1 spray into both nostrils 2 (two) times daily. 12/29/21   [provider]  beclomethasone (QVAR) 80 MCG/ACT inhaler Inhale 2 puffs into the lungs 2 (two) times daily as needed (respiratory issues.).     [provider]  BELSOMRA 15 MG TABS Take 1 tablet by mouth at bedtime. 04/30/21   [provider]  brimonidine (ALPHAGAN P) 0.1 % SOLN Place 1 drop into the right eye 2 (two) times daily. 12/25/20    [provider]  cephALEXin (KEFLEX) 500 MG capsule Take 1 capsule (500 mg total) by mouth 4 (four) times daily. 08/20/22   Terrilee Files, MD  cetirizine (ZYRTEC) 10 MG tablet Take 10 mg by mouth daily as needed for allergies.  01/26/16   [provider]  clopidogrel (PLAVIX) 75 MG tablet Take 75 mg by mouth daily.    [provider]  dexlansoprazole (DEXILANT) 60 MG capsule Take 1 capsule (60 mg total) by mouth daily. 07/05/17   Fields, Darleene Cleaver, MD  esomeprazole (NEXIUM) 40 MG capsule Take 40 mg by mouth daily. 07/06/22   [provider]  Evolocumab (REPATHA SURECLICK) 140 MG/ML SOAJ Take 140 mg by mouth every 14 (fourteen) days. 03/10/22   Antoine Poche, MD  ezetimibe (ZETIA) 10 MG tablet Take 10 mg by mouth daily.    [provider]  fluticasone (FLONASE) 50 MCG/ACT nasal spray Place 2 sprays into both nostrils daily.    [provider]  furosemide (LASIX) 40 MG tablet TAKE 1 TABLET BY MOUTH EVERY DAY AND MAY TAKE ADDITIONAL TABLET AS NEEDED FOR SWELLING OR WEIGHT GAIN 08/03/22   Gaston Islam., NP  glipiZIDE (GLUCOTROL) 10 MG tablet Take 10 mg by mouth daily before breakfast.    [provider]  isosorbide mononitrate (IMDUR) 60 MG 24 hr tablet Take 60 mg by mouth daily.    [provider]  ketoconazole (NIZORAL) 2 % shampoo Apply 1 application topically daily as needed for irritation.  10/23/19   [provider]  latanoprost (XALATAN) 0.005 % ophthalmic solution Place 1 drop into both eyes at bedtime.    [provider]  LINZESS 290 MCG CAPS capsule TAKE 1 CAPSULE(290 MCG) BY MOUTH DAILY BEFORE BREAKFAST Patient taking differently: Take 290 mcg by mouth daily before breakfast. May take 1 additional capsule as needed for constipation 02/04/21   Tiffany Kocher, PA-C  lisinopril (ZESTRIL) 10 MG tablet Take 1 tablet (10 mg total) by mouth daily. 05/21/22   Johnson, Clanford L, MD   lisinopril-hydrochlorothiazide (ZESTORETIC) 10-12.5 MG tablet Take 1 tablet by mouth daily. Patient not taking: Reported on 07/24/2022    [provider]  magnesium citrate SOLN Take 1 Bottle by mouth as needed for moderate constipation.     [provider]  metFORMIN (GLUCOPHAGE-XR) 500 MG 24 hr tablet Take 500 mg by mouth daily with breakfast.     [provider]  metoprolol tartrate (LOPRESSOR) 50 MG tablet TAKE 1 TABLET(50 MG) BY MOUTH TWICE DAILY 02/04/22   Lyn Records, MD  MOUNJARO 2.5 MG/0.5ML Pen Inject 2.5 mg into the skin once a week. Patient not taking: Reported  on 07/08/2022    [provider]  MOVANTIK 25 MG TABS tablet Take 25 mg by mouth every morning. 07/09/22   [provider]  Multiple Vitamin (MULTIVITAMIN WITH MINERALS) TABS tablet Take 1 tablet by mouth daily.    [provider]  NARCAN 4 MG/0.1ML LIQD nasal spray kit Place 1 spray into the nose as needed (opioid overdose).  06/15/17   [provider]  nitroGLYCERIN (NITROSTAT) 0.4 MG SL tablet Place 1 tablet (0.4 mg total) under the tongue every 5 (five) minutes as needed for chest pain. 08/26/21   Johnson, Clanford L, MD  omeprazole (PRILOSEC) 20 MG capsule Take 20 mg by mouth 2 (two) times daily as needed. 07/21/22   [provider]  oxyCODONE-acetaminophen (PERCOCET) 10-325 MG tablet Take 1 tablet by mouth every 4 (four) hours as needed for pain.    [provider]  potassium chloride SA (KLOR-CON) 20 MEQ tablet Take 20 mEq by mouth 2 (two) times daily.    [provider]  predniSONE (DELTASONE) 20 MG tablet Take 2 tablets (40 mg total) by mouth daily. Patient not taking: Reported on 07/24/2022 07/08/22   Eber Hong, MD  sertraline (ZOLOFT) 100 MG tablet Take 100 mg by mouth daily.    [provider]  sildenafil (VIAGRA) 100 MG tablet Take by mouth as needed for erectile dysfunction. 03/11/22   [provider]  sucralfate  (CARAFATE) 1 GM/10ML suspension in the morning, at noon, in the evening, and at bedtime. 09/10/20   [provider]  tamsulosin (FLOMAX) 0.4 MG CAPS capsule Take 1 capsule (0.4 mg total) by mouth daily after breakfast. 01/07/16   Adonis Brook, NP  TRULANCE 3 MG TABS Take 1 tablet by mouth daily.    [provider]  Vitamin D, Ergocalciferol, (DRISDOL) 1.25 MG (50000 UT) CAPS capsule Take 50,000 Units by mouth every 7 (seven) days. Monday    [provider]    Current Facility-Administered Medications  Medication Dose Route Frequency Provider Last Rate Last Admin   0.9 % NaCl with KCl 20 mEq/ L  infusion   Intravenous Continuous Tat, David, MD 75 mL/hr at 08/21/22 1551 New Bag at 08/21/22 1551   acetaminophen (TYLENOL) tablet 650 mg  650 mg Oral Q6H PRN Tat, Onalee Hua, MD       Or   acetaminophen (TYLENOL) suppository 650 mg  650 mg Rectal Q6H PRN Tat, Onalee Hua, MD       albuterol (PROVENTIL) (2.5 MG/3ML) 0.083% nebulizer solution 2.5 mg  2.5 mg Nebulization Q6H PRN Tat, Onalee Hua, MD       ALPRAZolam Prudy Feeler) tablet 1 mg  1 mg Oral QID PRN Tat, Onalee Hua, MD       Melene Muller ON 08/22/2022] atorvastatin (LIPITOR) tablet 80 mg  80 mg Oral Daily Tat, Onalee Hua, MD       Melene Muller ON 08/22/2022] cefTRIAXone (ROCEPHIN) 2 g in sodium chloride 0.9 % 100 mL IVPB  2 g Intravenous Q24H Tat, David, MD       enoxaparin (LOVENOX) injection 40 mg  40 mg Subcutaneous Q24H Tat, Onalee Hua, MD       Melene Muller ON 08/22/2022] ezetimibe (ZETIA) tablet 10 mg  10 mg Oral Daily Tat, Onalee Hua, MD       Melene Muller ON 08/22/2022] fluticasone (FLONASE) 50 MCG/ACT nasal spray 2 spray  2 spray Each Nare Daily Tat, David, MD       insulin aspart (novoLOG) injection 0-9 Units  0-9 Units Subcutaneous TID WC Catarina Hartshorn, MD       [  START ON 08/22/2022] linaclotide (LINZESS) capsule 290 mcg  290 mcg Oral QAC breakfast Tat, Onalee Hua, MD       metoprolol tartrate (LOPRESSOR) tablet 25 mg  25 mg Oral BID Tat, David, MD       oxyCODONE-acetaminophen  (PERCOCET/ROXICET) 5-325 MG per tablet 1 tablet  1 tablet Oral Q4H PRN Tat, Onalee Hua, MD       And   oxyCODONE (Oxy IR/ROXICODONE) immediate release tablet 5 mg  5 mg Oral Q4H PRN Tat, Onalee Hua, MD       pantoprazole (PROTONIX) EC tablet 40 mg  40 mg Oral BID Tat, David, MD       prochlorperazine (COMPAZINE) injection 5 mg  5 mg Intravenous Q6H PRN Tat, Onalee Hua, MD       Melene Muller ON 08/22/2022] sertraline (ZOLOFT) tablet 100 mg  100 mg Oral Daily Tat, David, MD       Melene Muller ON 08/22/2022] tamsulosin [facility]) capsule 0.4 mg  0.4 mg Oral QPC breakfast Tat, David, MD        Allergies as of 08/21/2022 - Review Complete 08/21/2022  Allergen Reaction Noted   Gabapentin Hives    Ibuprofen Other (See Comments)    Zolpidem tartrate  04/13/2022   Naproxen Other (See Comments)     Family History  Problem Relation Age of Onset   Diabetes Mother    Hypertension Mother    Heart attack Mother 31   Hypertension Father    Diabetes Father    Heart attack Father 24   Heart attack Other        mother, father, brother, sister all deceased due to MI   Heart attack Sister    Heart attack Brother    Seizures Brother    Heart failure Other    Colon cancer Neg Hx    Liver disease Neg Hx    Anesthesia problems Neg Hx    Hypotension Neg Hx    Malignant hyperthermia Neg Hx    Pseudochol deficiency Neg Hx    Colon polyps Neg Hx     Social History   Socioeconomic History   Marital status: Single    Spouse name: Not on file   Number of children: 2   Years of education: Not on file   Highest education level: Not on file  Occupational History   Occupation: disabled    Employer: NOT EMPLOYED  Tobacco Use   Smoking status: Never   Smokeless tobacco: Never  Vaping Use   Vaping Use: Never used  Substance and Sexual Activity   Alcohol use: No    Alcohol/week: 0.0 standard drinks of alcohol   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)    Sexual activity: Not Currently  Other Topics Concern   Not on file  Social History Narrative   Lives w/ son-23/22   Social Determinants of Health   Financial Resource Strain: Not on file  Food Insecurity: No Food Insecurity (05/18/2022)   Hunger Vital Sign    Worried About Running Out of Food in the Last Year: Never true    Ran Out of Food in the Last Year: Never true  Transportation Needs: No Transportation Needs (05/18/2022)   PRAPARE - Administrator, Civil Service (Medical): No    Lack of Transportation (Non-Medical): No  Physical Activity: Not on file  Stress: Not on file  Social Connections: Not on file  Intimate Partner  Violence: Not At Risk (05/18/2022)   Humiliation, Afraid, Rape, and Kick questionnaire    Fear of Current or Ex-Partner: No    Emotionally Abused: No    Physically Abused: No    Sexually Abused: No    Review of Systems:  As in history of present illness  Physical Exam: Vital signs in last 24 hours: Temp:  [98.9 F (37.2 C)-103 F (39.4 C)] 99.3 F (37.4 C) (06/29 1436) Pulse Rate:  [88-99] 88 (06/29 1436) Resp:  [17-34] 17 (06/29 1436) BP: (109-137)/(54-98) 115/61 (06/29 1436) SpO2:  [95 %-100 %] 100 % (06/29 1436) Weight:  [103.9 kg] 103.9 kg (06/29 1136)   General:   Alert, very pleasant conversant individual in no acute distress. pleasant and cooperative in NAD Neck:  Supple; no masses or thyromegaly. Lungs:  Clear throughout to auscultation.   No wheezes, crackles, or rhonchi. No acute distress. Heart:  Regular rate and rhythm; no murmurs, clicks, rubs,  or gallops. Abdomen:  Soft, nontender and nondistended. No masses, hepatosplenomegaly or hernias noted. Normal bowel sounds, without guarding, and without rebound.    Intake/Output from previous day: No intake/output data recorded. Intake/Output this shift: No intake/output data recorded.  Lab Results: Recent Labs    08/20/22 1523 08/21/22 1217  WBC 10.2 8.3  HGB 12.1*  11.3*  HCT 35.2* 32.6*  PLT 213 152   BMET Recent Labs    08/20/22 1523 08/21/22 1217  NA 131* 131*  K 3.0* 3.7  CL 93* 95*  CO2 27 25  GLUCOSE 154* 109*  BUN 9 11  CREATININE 1.17 1.29*  CALCIUM 9.1 8.4*   LFT Recent Labs    08/21/22 1217  PROT 7.9  ALBUMIN 3.6  AST 33  ALT 21  ALKPHOS 77  BILITOT 1.9*   PT/INR No results for input(s): "LABPROT", "INR" in the last 72 hours. Hepatitis Panel No results for input(s): "HEPBSAG", "HCVAB", "HEPAIGM", "HEPBIGM" in the last 72 hours. C-Diff No results for input(s): "CDIFFTOX" in the last 72 hours.  Studies/Results: CT CHEST ABDOMEN PELVIS W CONTRAST  Result Date: 08/20/2022 CLINICAL DATA:  Sepsis, fever EXAM: CT CHEST, ABDOMEN, AND PELVIS WITH CONTRAST TECHNIQUE: Multidetector CT imaging of the chest, abdomen and pelvis was performed following the standard protocol during bolus administration of intravenous contrast. RADIATION DOSE REDUCTION: This exam was performed according to the departmental dose-optimization program which includes automated exposure control, adjustment of the mA and/or kV according to patient size and/or use of iterative reconstruction technique. CONTRAST:  OMNIPAQUE IOHEXOL 300 MG/ML  SOLN COMPARISON:  CT chest done on 01/02/2022 FINDINGS: CT CHEST FINDINGS Cardiovascular: There is evidence of previous coronary bypass surgery. There is vascular stent in proximal course of left subclavian artery. Mediastinum/Nodes: No significant lymphadenopathy is seen. Lungs/Pleura: Small linear densities are seen in left lower lung fields suggesting scarring or subsegmental atelectasis. There is no focal pulmonary consolidation. There is no pleural effusion or pneumothorax. Musculoskeletal: No acute findings are seen. Degenerative changes are noted in lumbar spine with encroachment of neural foramina at multiple levels, more so at L5-S1 level. CT ABDOMEN PELVIS FINDINGS Hepatobiliary: No focal abnormalities are seen in  liver. There is no dilation of bile ducts. There is increased density in gallbladder lumen. There is no wall thickening in gallbladder. Pancreas: No focal abnormalities are seen. Spleen: Unremarkable. Adrenals/Urinary Tract: Adrenals are unremarkable. There is no hydronephrosis. There are patchy areas of decreased enhancement in right renal cortex. Right kidney slightly larger than left. There is mild right  perinephric stranding. There are no renal or ureteral stones. Urinary bladder is unremarkable. Stomach/Bowel: Small hiatal hernia is seen. Small bowel loops are not dilated. There is fluid in the lumen of small bowel loops and colon which may be a normal variation or suggest nonspecific enteritis. There is no significant wall thickening in the small bowel loops and colon. The appendix is not seen. There is no pericolic stranding. Vascular/Lymphatic: Unremarkable Reproductive: Unremarkable. Other: There is no ascites or pneumoperitoneum. Small umbilical hernia containing fat is seen. Musculoskeletal: Degenerative changes are noted with spinal stenosis and encroachment of neural foramina at multiple levels in lower lumbar spine. IMPRESSION: There are patchy areas of decreased cortical enhancement in right kidney suggesting acute pyelonephritis. There is no hydronephrosis. There are no renal or ureteral stones. There is no focal pulmonary consolidation. There is no pleural effusion or pneumothorax. Previous coronary bypass surgery. Vascular stent is noted in the proximal course of left subclavian artery. There is no evidence of intestinal obstruction. There is fluid in the lumen of small bowel loops and colon which may be a normal variation or suggest nonspecific enteritis. There is no bowel wall thickening or significant dilation. There is subtle increased density in the lumen of gallbladder suggesting possible gallbladder stones. There are no signs of acute cholecystitis. There is no dilation of bile ducts.  Follow-up gallbladder sonogram in nonemergent setting may be considered. Small hiatal hernia.  Lumbar spondylosis. Electronically Signed   By: Ernie Avena M.D.   On: 08/20/2022 20:42   DG Chest 2 View  Result Date: 08/20/2022 CLINICAL DATA:  Shortness of breath EXAM: CHEST - 2 VIEW COMPARISON:  07/24/2022 FINDINGS: Post sternotomy changes. Patchy atelectasis or minimal infiltrates at the bases. Normal cardiac size. No pneumothorax. Stent superior to the aortic arch. Scattered fluid levels over the upper abdomen. Air collection in the epigastric area. IMPRESSION: Patchy atelectasis or minimal infiltrates at the bases. Scattered air-fluid levels in the abdomen incompletely assessed. Focal air collection in the epigastrium probably air distended bowel, but recommend dedicated abdominal radiographs for further assessment. Electronically Signed   By: Jasmine Pang M.D.   On: 08/20/2022 15:34    Impression: 62 year old gentleman with multiple comorbidities admitted to the hospital with right pyelonephritis and urosepsis.  He is on IV antibiotics.  GI consult for esophageal dysphagia. He has a known history of esophageal stenosis dilated at our institution previously.  He states dilation previously improved his dysphagia significantly until insidious recurrence recently.  Attempted EGD earlier this month was thwarted by presence of retained gastric contents.  Previously on Mounjaro but none in about a month.  He is on multiple medications including including opioids.  Given his diabetes and chronic narcotic therapy he may have chronic issues with delayed gastric emptying.  Recent diarrhea for which stool studies have been ordered.  I note he has not been on multiple agents for constipation recently.  They are being held.  Patient would benefit from upper endoscopic evaluation with esophageal dilation as feasible and appropriate while he is here.  Recommendations:  -Depending on clinical  progression, will tentatively offer him an EGD with esophageal dilation.  I have concerns about delayed gastric emptying.  Therefore, will start him on clear liquid diet on June 30. The risks, benefits, limitations, alternatives and imponderables have been reviewed with the patient. Potential for esophageal dilation, biopsy, etc. have also been reviewed.  Questions have been answered. He is agreeable.  -Will also utilize IV erythromycin as a prokinetic agent just  prior to his endoscopy.  - Follow-up on pending stool studies.  -I'll re-assess tomorrow.  -Further recommendations to follow.      Notice:  This dictation was prepared with Dragon dictation along with smaller phrase technology. Any transcriptional errors that result from this process are unintentional and may not be corrected upon review.

## 2022-08-21 NOTE — ED Notes (Signed)
08/21/22 @1020  Lab called and stated pt has a positive blood culture. Anaerobic culture gram negative rods. EDP aware and requested nurse to call pt to tell pt to come to ED for admission.

## 2022-08-22 DIAGNOSIS — N10 Acute tubulo-interstitial nephritis: Secondary | ICD-10-CM | POA: Diagnosis not present

## 2022-08-22 DIAGNOSIS — E669 Obesity, unspecified: Secondary | ICD-10-CM | POA: Diagnosis not present

## 2022-08-22 DIAGNOSIS — R7881 Bacteremia: Secondary | ICD-10-CM | POA: Diagnosis not present

## 2022-08-22 DIAGNOSIS — I5032 Chronic diastolic (congestive) heart failure: Secondary | ICD-10-CM | POA: Diagnosis not present

## 2022-08-22 LAB — CBC
HCT: 32.3 % — ABNORMAL LOW (ref 39.0–52.0)
Hemoglobin: 11 g/dL — ABNORMAL LOW (ref 13.0–17.0)
MCH: 29.2 pg (ref 26.0–34.0)
MCHC: 34.1 g/dL (ref 30.0–36.0)
MCV: 85.7 fL (ref 80.0–100.0)
Platelets: 156 10*3/uL (ref 150–400)
RBC: 3.77 MIL/uL — ABNORMAL LOW (ref 4.22–5.81)
RDW: 14.4 % (ref 11.5–15.5)
WBC: 5 10*3/uL (ref 4.0–10.5)
nRBC: 0 % (ref 0.0–0.2)

## 2022-08-22 LAB — BASIC METABOLIC PANEL
Anion gap: 9 (ref 5–15)
BUN: 8 mg/dL (ref 8–23)
CO2: 27 mmol/L (ref 22–32)
Calcium: 8 mg/dL — ABNORMAL LOW (ref 8.9–10.3)
Chloride: 98 mmol/L (ref 98–111)
Creatinine, Ser: 0.98 mg/dL (ref 0.61–1.24)
GFR, Estimated: >60 mL/min (ref >60)
Glucose, Bld: 107 mg/dL — ABNORMAL HIGH (ref 70–99)
Potassium: 3.1 mmol/L — ABNORMAL LOW (ref 3.5–5.1)
Sodium: 134 mmol/L — ABNORMAL LOW (ref 135–145)

## 2022-08-22 LAB — GLUCOSE, CAPILLARY
Glucose-Capillary: 91 mg/dL (ref 70–99)
Glucose-Capillary: 128 mg/dL — ABNORMAL HIGH (ref 70–99)
Glucose-Capillary: 131 mg/dL — ABNORMAL HIGH (ref 70–99)

## 2022-08-22 LAB — URINE CULTURE
Culture: >=100000 — AB
Special Requests: NORMAL

## 2022-08-22 LAB — CULTURE, BLOOD (ROUTINE X 2)

## 2022-08-22 MED ORDER — METOPROLOL TARTRATE 25 MG PO TABS
12.5000 mg | ORAL_TABLET | Freq: Two times a day (BID) | ORAL | Status: DC
  Administered 2022-08-22 – 2022-08-24 (×4): 12.5 mg via ORAL
  Filled 2022-08-22 (×3): qty 1

## 2022-08-22 MED ORDER — SODIUM CHLORIDE 0.9 % IV SOLN: INTRAVENOUS | Status: DC

## 2022-08-22 MED ORDER — POTASSIUM CHLORIDE CRYS ER 20 MEQ PO TBCR
40.0000 meq | EXTENDED_RELEASE_TABLET | Freq: Once | ORAL | Status: AC
  Administered 2022-08-22: 40 meq via ORAL
  Filled 2022-08-22: qty 2

## 2022-08-22 MED ORDER — METOCLOPRAMIDE HCL 5 MG/ML IJ SOLN
10.0000 mg | Freq: Once | INTRAMUSCULAR | Status: AC
  Administered 2022-08-23: 10 mg via INTRAVENOUS
  Filled 2022-08-22: qty 2

## 2022-08-22 NOTE — Progress Notes (Signed)
Spot checked patient's temperature due to them reporting feeling chilled with body aches.  VS taken about 30 minutes prior to check and were stable.  PRN tylenol given and Temperature/VS WNL at this time   08/21/22 1953  Assess: MEWS Score  Temp (!) 101.6 F (38.7 C)  Level of Consciousness Alert  Assess: MEWS Score  MEWS Temp 2  MEWS Systolic 0  MEWS Pulse 0  MEWS RR 0  MEWS LOC 0  MEWS Score 2  MEWS Score Color Yellow  Assess: if the MEWS score is Yellow or Red  Were vital signs taken at a resting state? Yes  Focused Assessment No change from prior assessment  Does the patient meet 2 or more of the SIRS criteria? No  MEWS guidelines implemented  Yes, yellow  Treat  MEWS Interventions Considered administering scheduled or prn medications/treatments as ordered  Take Vital Signs  Increase Vital Sign Frequency  Yellow: Q2hr x1, continue Q4hrs until patient remains green for 12hrs  Escalate  MEWS: Escalate Yellow: Discuss with charge nurse and consider notifying provider and/or RRT  Assess: SIRS CRITERIA  SIRS Temperature  1  SIRS Pulse 0  SIRS Respirations  0  SIRS WBC 0  SIRS Score Sum  1

## 2022-08-22 NOTE — Progress Notes (Signed)
PROGRESS NOTE  Dalton [name]:[id number] DOB: May 09, 1960 DOA: 08/21/2022 PCP: Fleet Contras, MD  Brief History:  62 year old male with a history of coronary artery disease status post CABG, COPD, esophagitis, HFpEF, anxiety/depression, SVT, hypertension, hyperlipidemia, and diabetes mellitus type 2 presenting with upper abdominal pain and bacteremia.  The patient presented to the ER on 08/12/2022 initially for upper abdominal pain radiating to chest of several months duration.  The patient initially eloped in triage.  He subsequently returned to the ED later in the day, and his workup revealed that the patient had pyelonephritis.  The patient was discharged home in stable condition with cephalexin. The patient was called back to the ED on 08/21/2022 because of positive blood cultures with gram-negative rods.. Notably, the patient states that he has had subjective fevers and chills since 08/16/2022.  He has had some nausea without emesis.  He complains of upper abdominal pain as well as left sided abdominal pain.  He has some dysuria.  There is no hematuria.  He denies hematochezia or melena.  He has been having some loose stools but not able to quantify how much.  He denies any chest pain, headache, neck pain, cough, hemoptysis.  Notably, the patient has been having dysphagia type symptoms and upper abdominal pain for several months.  He underwent EGD on 08/03/2022 by Dr. Kinnie Scales which showed abundant solid food in the stomach.  The procedure was subsequently aborted to avoid aspiration and requiring intubation.  The plan was to have the patient have an upper GI series done.  Apparently this was scheduled for 08/19/2022.  The patient says that he had this done, but the results are not obtainable at this time.  Nevertheless, the patient states that he continues taking PPI twice daily.  He is unsure if he takes Carafate.  In the ED, the patient was febrile up to 103.1 F.  He was  hemodynamically stable with oxygen saturation 96% on room air.  WBC 8.3, hemoglobin 11.3, platelets 152,000.  Sodium 131, potassium 3.7, bicarbonate 25, serum creatinine 1.29.  08/20/2022 CT of the abdomen and pelvis showed patchy areas of decreased cortical enhancement in the right kidney consistent with acute pyelonephritis.  There is no hydronephrosis or ureteral stones.  The patient was started ceftriaxone.   Assessment/Plan:  E. coli bacteremia -08/20/2022 blood cultures--gram-negative rods -Multiplex PCR shows E. coli without resistance -Continue ceftriaxone 2 g daily -Source is pyelonephritis -follow blood cultures   Pyelonephritis -08/20/2022 UA 21-50 WBC -Continue ceftriaxone pending culture data   Dysphagia/upper abdominal pain -This appears to be a chronic issue -He had been in the process of being worked up by GI at [facility] as discussed above -Patient is requesting GI evaluation -GI consult appreciated -Restart pantoprazole twice daily   Chest pain/coronary artery disease -longstanding history of chest pain (previously underwent CABG in 2002 but had an atretic LIMA by follow-up catheterization in 2013 and Coronary CT in 2021 showed a calcium score of 0). -04/19/2022 Myoview--low risk -Cycle troponins 6>>8 -05/19/2022 echo EF 50-55%, no WMA, grade 1 DD, normal RV function -Imdur has been discontinued March 2024 secondary to syncope   Chronic HFpEF --05/19/2022 echo EF 50-55%, no WMA, grade 1 DD, normal RV function -Clinically compensated -Holding furosemide temporarily   Mixed hyperlipidemia -Continue statin   Essential hypertension -Holding lisinopril  temporarily due to soft BPs -lower dose metoprolol   Diabetes mellitus type 2 -Holding metformin and glipizide -05/19/2022 hemoglobin A1c  5.9 -Repeat A1c -Mounjaro stopped about a month ago   Opioid dependence -PDMP reviewed -Percocet 10/325, #168, refill 08/18/22 -xanax 1 mg, #120, refill 07/29/22    Anxiety/depression -Continue Xanax and sertraline   Obesity -BMI 32.86 -lifestyle modification  Hypokalemia -replete -check mag      Family Communication:  son at bedside 6/30  Consultants:  GI  Code Status:  FULL   DVT Prophylaxis: Evart Lovenox   Procedures: As Listed in Progress Note Above  Antibiotics: Ceftriaxone 6/30>>    Subjective: Pt states abd pain is a little better.  Still has some dysphagia.  Denies f/c, cp, sob.  Had one melenotic stool  Objective: Vitals:   08/21/22 2034 08/21/22 2233 08/22/22 0242 08/22/22 0542  BP: (!) 106/59 109/71 129/76 105/72  Pulse: 87 70 78 78  Resp: 18 20 20 20   Temp: 99.3 F (37.4 C) 97.8 F (36.6 C) 99.3 F (37.4 C) 98.1 F (36.7 C)  TempSrc: Oral Oral Oral Oral  SpO2: 98% 100% 100% 99%  Weight:      Height:        Intake/Output Summary (Last 24 hours) at 08/22/2022 0920 Last data filed at 08/22/2022 0500 Gross per 24 hour  Intake 1333.75 ml  Output 800 ml  Net 533.75 ml   Weight change:  Exam:  General:  Pt is alert, follows commands appropriately, not in acute distress HEENT: No icterus, No thrush, No neck mass, [place]/AT Cardiovascular: RRR, S1/S2, no rubs, no gallops Respiratory: CTA bilaterally, no wheezing, no crackles, no rhonchi Abdomen: Soft/+BS, LLQ tender, non distended, no guarding Extremities: No edema, No lymphangitis, No petechiae, No rashes, no synovitis   Data Reviewed: I have personally reviewed following labs and imaging studies Basic Metabolic Panel: Recent Labs  Lab 08/20/22 1523 08/21/22 1217 08/22/22 0408  NA 131* 131* 134*  K 3.0* 3.7 3.1*  CL 93* 95* 98  CO2 27 25 27   GLUCOSE 154* 109* 107*  BUN 9 11 8   CREATININE 1.17 1.29* 0.98  CALCIUM 9.1 8.4* 8.0*   Liver Function Tests: Recent Labs  Lab 08/20/22 1523 08/21/22 1217  AST 31 33  ALT 23 21  ALKPHOS 78 77  BILITOT 1.6* 1.9*  PROT 7.9 7.9  ALBUMIN 3.8 3.6   No results for input(s): "LIPASE", "AMYLASE" in the  last 168 hours. No results for input(s): "AMMONIA" in the last 168 hours. Coagulation Profile: No results for input(s): "INR", "PROTIME" in the last 168 hours. CBC: Recent Labs  Lab 08/20/22 1523 08/21/22 1217 08/22/22 0408  WBC 10.2 8.3 5.0  NEUTROABS 8.4* 6.5  --   HGB 12.1* 11.3* 11.0*  HCT 35.2* 32.6* 32.3*  MCV 85.6 84.2 85.7  PLT 213 152 156   Cardiac Enzymes: No results for input(s): "CKTOTAL", "CKMB", "CKMBINDEX", "TROPONINI" in the last 168 hours. BNP: Invalid input(s): "POCBNP" CBG: Recent Labs  Lab 08/21/22 1613 08/21/22 2146 08/22/22 0847  GLUCAP 104* 112* 91   HbA1C: No results for input(s): "HGBA1C" in the last 72 hours. Urine analysis:    Component Value Date/Time   COLORURINE YELLOW 08/20/2022 2043   APPEARANCEUR CLEAR 08/20/2022 2043   LABSPEC 1.020 08/20/2022 2043   PHURINE 6.0 08/20/2022 2043   GLUCOSEU NEGATIVE 08/20/2022 2043   HGBUR SMALL (A) 08/20/2022 2043   BILIRUBINUR NEGATIVE 08/20/2022 2043   KETONESUR NEGATIVE 08/20/2022 2043   PROTEINUR NEGATIVE 08/20/2022 2043   UROBILINOGEN 1.0 09/25/2011 1513   NITRITE NEGATIVE 08/20/2022 2043   LEUKOCYTESUR MODERATE (A) 08/20/2022 2043  Sepsis Labs: @LABRCNTIP (procalcitonin:4,lacticidven:4) ) Recent Results (from the past 240 hour(s))  Blood culture (routine x 2)     Status: None (Preliminary result)   Collection Time: 08/20/22  3:23 PM   Specimen: Left Antecubital; Blood  Result Value Ref Range Status   Specimen Description   Final    LEFT ANTECUBITAL BLOOD Performed at [facility], [address], [place], [place] [address]    Special Requests   Final    BOTTLES DRAWN AEROBIC AND ANAEROBIC Blood Culture adequate volume Performed at [facility], [address] [place], [place] [address]    Culture  Setup Time   Final    GRAM NEGATIVE RODS ANAEROBIC BOTTLE ONLY Gram Stain Report Called to,Read Back By and Verified With: LONG L @ 0940 ON [id number] BY HENDERSON L  CRITICAL RESULT  CALLED TO, READ BACK BY AND VERIFIED WITH: RN JESSICA NEWMAN [id number] AT 1410 BY EC GRAM NEGATIVE RODS AEROBIC BOTTLE ONLY CRITICAL VALUE NOTED.  VALUE IS CONSISTENT WITH PREVIOUSLY REPORTED AND CALLED VALUE. Performed at [facility], [address] [place], [place] [address]    Culture GRAM NEGATIVE RODS  Final   Report Status PENDING  Incomplete  Blood culture (routine x 2)     Status: None (Preliminary result)   Collection Time: 08/20/22  3:23 PM   Specimen: Right Antecubital; Blood  Result Value Ref Range Status   Specimen Description RIGHT ANTECUBITAL  Final   Special Requests   Final    BOTTLES DRAWN AEROBIC AND ANAEROBIC Blood Culture adequate volume   Culture   Final    NO GROWTH 2 DAYS Performed at [facility], [address] [place], [place] [address]    Report Status PENDING  Incomplete  Blood Culture ID Panel (Reflexed)     Status: Abnormal   Collection Time: 08/20/22  3:23 PM  Result Value Ref Range Status   Enterococcus faecalis NOT DETECTED NOT DETECTED Final   Enterococcus Faecium NOT DETECTED NOT DETECTED Final   Listeria monocytogenes NOT DETECTED NOT DETECTED Final   Staphylococcus species NOT DETECTED NOT DETECTED Final   Staphylococcus aureus (BCID) NOT DETECTED NOT DETECTED Final   Staphylococcus epidermidis NOT DETECTED NOT DETECTED Final   Staphylococcus lugdunensis NOT DETECTED NOT DETECTED Final   Streptococcus species NOT DETECTED NOT DETECTED Final   Streptococcus agalactiae NOT DETECTED NOT DETECTED Final   Streptococcus pneumoniae NOT DETECTED NOT DETECTED Final   Streptococcus pyogenes NOT DETECTED NOT DETECTED Final   A.calcoaceticus-baumannii NOT DETECTED NOT DETECTED Final   Bacteroides fragilis NOT DETECTED NOT DETECTED Final   Enterobacterales DETECTED (A) NOT DETECTED Final    Comment: Enterobacterales represent a large order of gram negative bacteria, not a single organism. CRITICAL RESULT CALLED TO, READ BACK BY AND VERIFIED WITH: RN  JESSICA NEWMAN [id number] AT 1410 BY EC    Enterobacter cloacae complex NOT DETECTED NOT DETECTED Final   Escherichia coli DETECTED (A) NOT DETECTED Final    Comment: CRITICAL RESULT CALLED TO, READ BACK BY AND VERIFIED WITH: RN JESSICA NEWMAN [phone] AT 1410 BY EC    Klebsiella aerogenes NOT DETECTED NOT DETECTED Final   Klebsiella oxytoca NOT DETECTED NOT DETECTED Final   Klebsiella pneumoniae NOT DETECTED NOT DETECTED Final   Proteus species NOT DETECTED NOT DETECTED Final   Salmonella species NOT DETECTED NOT DETECTED Final   Serratia marcescens NOT DETECTED NOT DETECTED Final   Haemophilus influenzae NOT DETECTED NOT DETECTED Final   Neisseria meningitidis NOT DETECTED NOT DETECTED  Final   Pseudomonas aeruginosa NOT DETECTED NOT DETECTED Final   Stenotrophomonas maltophilia NOT DETECTED NOT DETECTED Final   Candida albicans NOT DETECTED NOT DETECTED Final   Candida auris NOT DETECTED NOT DETECTED Final   Candida glabrata NOT DETECTED NOT DETECTED Final   Candida krusei NOT DETECTED NOT DETECTED Final   Candida parapsilosis NOT DETECTED NOT DETECTED Final   Candida tropicalis NOT DETECTED NOT DETECTED Final   Cryptococcus neoformans/gattii NOT DETECTED NOT DETECTED Final   CTX-M ESBL NOT DETECTED NOT DETECTED Final   Carbapenem resistance IMP NOT DETECTED NOT DETECTED Final   Carbapenem resistance KPC NOT DETECTED NOT DETECTED Final   Carbapenem resistance NDM NOT DETECTED NOT DETECTED Final   Carbapenem resist OXA 48 LIKE NOT DETECTED NOT DETECTED Final   Carbapenem resistance VIM NOT DETECTED NOT DETECTED Final    Comment: Performed at [facility], [address] [place], [place] [address]  Urine Culture     Status: Abnormal (Preliminary result)   Collection Time: 08/20/22  8:43 PM   Specimen: Urine, Clean Catch  Result Value Ref Range Status   Specimen Description   Final    URINE, CLEAN CATCH Performed at [facility], [address], [place], [place] [address]     Special Requests   Final    Normal Performed at [facility], [address], [place], [place] [address]    Culture >=100,000 COLONIES/mL GRAM NEGATIVE RODS (A)  Final   Report Status PENDING  Incomplete  C Difficile Quick Screen w PCR reflex     Status: None   Collection Time: 08/21/22  2:56 PM   Specimen: STOOL  Result Value Ref Range Status   C Diff antigen NEGATIVE NEGATIVE Final   C Diff toxin NEGATIVE NEGATIVE Final   C Diff interpretation No C. difficile detected.  Final    Comment: Performed at [facility], [address], [place], [place] [address]     Scheduled Meds:  atorvastatin  80 mg Oral Daily   enoxaparin (LOVENOX) injection  40 mg Subcutaneous Q24H   ezetimibe  10 mg Oral Daily   fluticasone  2 spray Each Nare Daily   insulin aspart  0-9 Units Subcutaneous TID WC   metoprolol tartrate  25 mg Oral BID   pantoprazole  40 mg Oral BID   sertraline  100 mg Oral Daily   tamsulosin  0.4 mg Oral QPC breakfast   Continuous Infusions:  0.9 % NaCl with KCl 20 mEq / L 75 mL/hr at 08/22/22 0358   cefTRIAXone (ROCEPHIN)  IV 2 g (08/22/22 0825)    Procedures/Studies: CT CHEST ABDOMEN PELVIS W CONTRAST  Result Date: 08/20/2022 CLINICAL DATA:  Sepsis, fever EXAM: CT CHEST, ABDOMEN, AND PELVIS WITH CONTRAST TECHNIQUE: Multidetector CT imaging of the chest, abdomen and pelvis was performed following the standard protocol during bolus administration of intravenous contrast. RADIATION DOSE REDUCTION: This exam was performed according to the departmental dose-optimization program which includes automated exposure control, adjustment of the mA and/or kV according to patient size and/or use of iterative reconstruction technique. CONTRAST:  OMNIPAQUE IOHEXOL 300 MG/ML  SOLN COMPARISON:  CT chest done on 01/02/2022 FINDINGS: CT CHEST FINDINGS Cardiovascular: There is evidence of previous coronary bypass surgery. There is vascular stent in proximal course of left subclavian artery.  Mediastinum/Nodes: No significant lymphadenopathy is seen. Lungs/Pleura: Small linear densities are seen in left lower lung fields suggesting scarring or subsegmental atelectasis. There is no focal pulmonary consolidation. There is no  pleural effusion or pneumothorax. Musculoskeletal: No acute findings are seen. Degenerative changes are noted in lumbar spine with encroachment of neural foramina at multiple levels, more so at L5-S1 level. CT ABDOMEN PELVIS FINDINGS Hepatobiliary: No focal abnormalities are seen in liver. There is no dilation of bile ducts. There is increased density in gallbladder lumen. There is no wall thickening in gallbladder. Pancreas: No focal abnormalities are seen. Spleen: Unremarkable. Adrenals/Urinary Tract: Adrenals are unremarkable. There is no hydronephrosis. There are patchy areas of decreased enhancement in right renal cortex. Right kidney slightly larger than left. There is mild right perinephric stranding. There are no renal or ureteral stones. Urinary bladder is unremarkable. Stomach/Bowel: Small hiatal hernia is seen. Small bowel loops are not dilated. There is fluid in the lumen of small bowel loops and colon which may be a normal variation or suggest nonspecific enteritis. There is no significant wall thickening in the small bowel loops and colon. The appendix is not seen. There is no pericolic stranding. Vascular/Lymphatic: Unremarkable Reproductive: Unremarkable. Other: There is no ascites or pneumoperitoneum. Small umbilical hernia containing fat is seen. Musculoskeletal: Degenerative changes are noted with spinal stenosis and encroachment of neural foramina at multiple levels in lower lumbar spine. IMPRESSION: There are patchy areas of decreased cortical enhancement in right kidney suggesting acute pyelonephritis. There is no hydronephrosis. There are no renal or ureteral stones. There is no focal pulmonary consolidation. There is no pleural effusion or pneumothorax. Previous  coronary bypass surgery. Vascular stent is noted in the proximal course of left subclavian artery. There is no evidence of intestinal obstruction. There is fluid in the lumen of small bowel loops and colon which may be a normal variation or suggest nonspecific enteritis. There is no bowel wall thickening or significant dilation. There is subtle increased density in the lumen of gallbladder suggesting possible gallbladder stones. There are no signs of acute cholecystitis. There is no dilation of bile ducts. Follow-up gallbladder sonogram in nonemergent setting may be considered. Small hiatal hernia.  Lumbar spondylosis. Electronically Signed   By: Ernie Avena M.D.   On: 08/20/2022 20:42   DG Chest 2 View  Result Date: 08/20/2022 CLINICAL DATA:  Shortness of breath EXAM: CHEST - 2 VIEW COMPARISON:  07/24/2022 FINDINGS: Post sternotomy changes. Patchy atelectasis or minimal infiltrates at the bases. Normal cardiac size. No pneumothorax. Stent superior to the aortic arch. Scattered fluid levels over the upper abdomen. Air collection in the epigastric area. IMPRESSION: Patchy atelectasis or minimal infiltrates at the bases. Scattered air-fluid levels in the abdomen incompletely assessed. Focal air collection in the epigastrium probably air distended bowel, but recommend dedicated abdominal radiographs for further assessment. Electronically Signed   By: Jasmine Pang M.D.   On: 08/20/2022 15:34   DG Chest Port 1 View  Result Date: 07/24/2022 CLINICAL DATA:  Shortness of breath on exertion, chest pain and coughing. EXAM: PORTABLE CHEST 1 VIEW COMPARISON:  07/08/2022. FINDINGS: Cardiac silhouette normal in size and configuration. Normal mediastinal and hilar contours. Previous median sternotomy. Left vascular stent lies just above the aortic knob, stable from the prior radiograph. Clear lungs.  No pleural effusion or pneumothorax. Skeletal structures are grossly intact. IMPRESSION: No active disease.  Electronically Signed   By: Amie Portland M.D.   On: 07/24/2022 17:37    Catarina Hartshorn, DO  [facility]  If 7PM-7AM, please contact night-coverage [email or web address] Password [email or web address] 08/22/2022, 9:20 AM   LOS: 1 day

## 2022-08-22 NOTE — Progress Notes (Signed)
Stable overnight.  Denies abdominal pain  Vital signs in last 24 hours: Temp:  [97.8 F (36.6 C)-103 F (39.4 C)] 98.1 F (36.7 C) (06/30 0542) Pulse Rate:  [70-90] 78 (06/30 0542) Resp:  [17-20] 20 (06/30 0542) BP: (105-129)/(59-76) 105/72 (06/30 0542) SpO2:  [98 %-100 %] 99 % (06/30 0542)   General:   Alert,  pleasant and cooperative in NAD Abdomen: Nondistended.  Soft and nontender  extremities:  Without clubbing or edema.    Intake/Output from previous day: 06/29 0701 - 06/30 0700 In: 1333.8 [P.O.:240; I.V.:93.8; IV Piggyback:1000] Out: 800 [Urine:800] Intake/Output this shift: No intake/output data recorded.  Lab Results: Recent Labs    08/20/22 1523 08/21/22 1217 08/22/22 0408  WBC 10.2 8.3 5.0  HGB 12.1* 11.3* 11.0*  HCT 35.2* 32.6* 32.3*  PLT 213 152 156   BMET Recent Labs    08/20/22 1523 08/21/22 1217 08/22/22 0408  NA 131* 131* 134*  K 3.0* 3.7 3.1*  CL 93* 95* 98  CO2 27 25 27   GLUCOSE 154* 109* 107*  BUN 9 11 8   CREATININE 1.17 1.29* 0.98  CALCIUM 9.1 8.4* 8.0*   LFT Recent Labs    08/21/22 1217  PROT 7.9  ALBUMIN 3.6  AST 33  ALT 21  ALKPHOS 77  BILITOT 1.9*   PT/INR No results for input(s): "LABPROT", "INR" in the last 72 hours. Hepatitis Panel No results for input(s): "HEPBSAG", "HCVAB", "HEPAIGM", "HEPBIGM" in the last 72 hours. C-Diff Recent Labs    08/21/22 1456  CDIFFTOX NEGATIVE    Studies/Results: CT CHEST ABDOMEN PELVIS W CONTRAST  Result Date: 08/20/2022 CLINICAL DATA:  Sepsis, fever EXAM: CT CHEST, ABDOMEN, AND PELVIS WITH CONTRAST TECHNIQUE: Multidetector CT imaging of the chest, abdomen and pelvis was performed following the standard protocol during bolus administration of intravenous contrast. RADIATION DOSE REDUCTION: This exam was performed according to the departmental dose-optimization program which includes automated exposure control, adjustment of the mA and/or kV according to patient size and/or use of  iterative reconstruction technique. CONTRAST:  OMNIPAQUE IOHEXOL 300 MG/ML  SOLN COMPARISON:  CT chest done on 01/02/2022 FINDINGS: CT CHEST FINDINGS Cardiovascular: There is evidence of previous coronary bypass surgery. There is vascular stent in proximal course of left subclavian artery. Mediastinum/Nodes: No significant lymphadenopathy is seen. Lungs/Pleura: Small linear densities are seen in left lower lung fields suggesting scarring or subsegmental atelectasis. There is no focal pulmonary consolidation. There is no pleural effusion or pneumothorax. Musculoskeletal: No acute findings are seen. Degenerative changes are noted in lumbar spine with encroachment of neural foramina at multiple levels, more so at L5-S1 level. CT ABDOMEN PELVIS FINDINGS Hepatobiliary: No focal abnormalities are seen in liver. There is no dilation of bile ducts. There is increased density in gallbladder lumen. There is no wall thickening in gallbladder. Pancreas: No focal abnormalities are seen. Spleen: Unremarkable. Adrenals/Urinary Tract: Adrenals are unremarkable. There is no hydronephrosis. There are patchy areas of decreased enhancement in right renal cortex. Right kidney slightly larger than left. There is mild right perinephric stranding. There are no renal or ureteral stones. Urinary bladder is unremarkable. Stomach/Bowel: Small hiatal hernia is seen. Small bowel loops are not dilated. There is fluid in the lumen of small bowel loops and colon which may be a normal variation or suggest nonspecific enteritis. There is no significant wall thickening in the small bowel loops and colon. The appendix is not seen. There is no pericolic stranding. Vascular/Lymphatic: Unremarkable Reproductive: Unremarkable. Other: There is no ascites or  pneumoperitoneum. Small umbilical hernia containing fat is seen. Musculoskeletal: Degenerative changes are noted with spinal stenosis and encroachment of neural foramina at multiple levels in lower  lumbar spine. IMPRESSION: There are patchy areas of decreased cortical enhancement in right kidney suggesting acute pyelonephritis. There is no hydronephrosis. There are no renal or ureteral stones. There is no focal pulmonary consolidation. There is no pleural effusion or pneumothorax. Previous coronary bypass surgery. Vascular stent is noted in the proximal course of left subclavian artery. There is no evidence of intestinal obstruction. There is fluid in the lumen of small bowel loops and colon which may be a normal variation or suggest nonspecific enteritis. There is no bowel wall thickening or significant dilation. There is subtle increased density in the lumen of gallbladder suggesting possible gallbladder stones. There are no signs of acute cholecystitis. There is no dilation of bile ducts. Follow-up gallbladder sonogram in nonemergent setting may be considered. Small hiatal hernia.  Lumbar spondylosis. Electronically Signed   By: Ernie Avena M.D.   On: 08/20/2022 20:42   DG Chest 2 View  Result Date: 08/20/2022 CLINICAL DATA:  Shortness of breath EXAM: CHEST - 2 VIEW COMPARISON:  07/24/2022 FINDINGS: Post sternotomy changes. Patchy atelectasis or minimal infiltrates at the bases. Normal cardiac size. No pneumothorax. Stent superior to the aortic arch. Scattered fluid levels over the upper abdomen. Air collection in the epigastric area. IMPRESSION: Patchy atelectasis or minimal infiltrates at the bases. Scattered air-fluid levels in the abdomen incompletely assessed. Focal air collection in the epigastrium probably air distended bowel, but recommend dedicated abdominal radiographs for further assessment. Electronically Signed   By: Jasmine Pang M.D.   On: 08/20/2022 15:34     Impression: Very pleasant 62 year old gentleman with multiple comorbidities admitted to the hospital with right E. coli pyelonephritis and urosepsis.   Clinically improved on antibiotics.  Chronic esophageal  dysphagia. History of esophageal stenosis dilated successfully by Dr. Marletta Lor back in 2021.  Standing improvement in symptoms until recently. Aborted EGD for dilation at [facility] earlier this month to retained gastric contents.  Likely has delayed gastric emptying due to diabetes, chronic opioid therapy and Mounjaro (which was discontinued about a month ago)   Recommendations:  -Tentatively plan for EGD with esophageal dilation tomorrow Will be reassessed first thing tomorrow morning  -Continue to replete potassium  -Clear liquid diet today with single dose of IV Reglan tomorrow morning to maximize chances for an empty stomach  -Further recommendations to follow.

## 2022-08-22 NOTE — Plan of Care (Signed)
  Problem: Education: Goal: Knowledge of General Education information will improve Description: Including pain rating scale, medication(s)/side effects and non-pharmacologic comfort measures Outcome: Progressing   Problem: Health Behavior/Discharge Planning: Goal: Ability to manage health-related needs will improve Outcome: Progressing   Problem: Clinical Measurements: Goal: Ability to maintain clinical measurements within normal limits will improve Outcome: Progressing Goal: Diagnostic test results will improve Outcome: Progressing Goal: Respiratory complications will improve Outcome: Progressing Goal: Cardiovascular complication will be avoided Outcome: Progressing   Problem: Activity: Goal: Risk for activity intolerance will decrease Outcome: Progressing   Problem: Coping: Goal: Level of anxiety will decrease Outcome: Progressing   Problem: Elimination: Goal: Will not experience complications related to urinary retention Outcome: Progressing   Problem: Pain Managment: Goal: General experience of comfort will improve Outcome: Progressing   Problem: Safety: Goal: Ability to remain free from injury will improve Outcome: Progressing   Problem: Skin Integrity: Goal: Risk for impaired skin integrity will decrease Outcome: Progressing   Problem: Clinical Measurements: Goal: Will remain free from infection Outcome: Not Progressing   Problem: Nutrition: Goal: Adequate nutrition will be maintained Outcome: Not Progressing   Problem: Elimination: Goal: Will not experience complications related to bowel motility Outcome: Not Progressing

## 2022-08-23 ENCOUNTER — Encounter (HOSPITAL_COMMUNITY)
Admission: EM | Disposition: A | Discharge status: Home / Self Care | Payer: Self-pay | Source: Home / Self Care | Attending: Internal Medicine

## 2022-08-23 ENCOUNTER — Encounter (HOSPITAL_COMMUNITY): Payer: Self-pay | Admitting: Internal Medicine

## 2022-08-23 ENCOUNTER — Inpatient Hospital Stay (HOSPITAL_COMMUNITY): Payer: 59 | Admitting: Certified Registered Nurse Anesthetist

## 2022-08-23 DIAGNOSIS — K3189 Other diseases of stomach and duodenum: Secondary | ICD-10-CM

## 2022-08-23 DIAGNOSIS — K259 Gastric ulcer, unspecified as acute or chronic, without hemorrhage or perforation: Secondary | ICD-10-CM

## 2022-08-23 DIAGNOSIS — I2510 Atherosclerotic heart disease of native coronary artery without angina pectoris: Secondary | ICD-10-CM

## 2022-08-23 DIAGNOSIS — I10 Essential (primary) hypertension: Secondary | ICD-10-CM

## 2022-08-23 DIAGNOSIS — E119 Type 2 diabetes mellitus without complications: Secondary | ICD-10-CM

## 2022-08-23 DIAGNOSIS — R1310 Dysphagia, unspecified: Secondary | ICD-10-CM

## 2022-08-23 HISTORY — PX: ESOPHAGOGASTRODUODENOSCOPY (EGD) WITH PROPOFOL: SHX5813

## 2022-08-23 HISTORY — PX: MALONEY DILATION: SHX5535

## 2022-08-23 HISTORY — PX: BIOPSY: SHX5522

## 2022-08-23 LAB — GASTROINTESTINAL PANEL BY PCR, STOOL (REPLACES STOOL CULTURE)

## 2022-08-23 LAB — BASIC METABOLIC PANEL
Anion gap: 8 (ref 5–15)
BUN: 5 mg/dL — ABNORMAL LOW (ref 8–23)
CO2: 27 mmol/L (ref 22–32)
Calcium: 7.9 mg/dL — ABNORMAL LOW (ref 8.9–10.3)
Chloride: 101 mmol/L (ref 98–111)
Creatinine, Ser: 0.94 mg/dL (ref 0.61–1.24)
GFR, Estimated: >60 mL/min (ref >60)
Glucose, Bld: 105 mg/dL — ABNORMAL HIGH (ref 70–99)
Potassium: 3.4 mmol/L — ABNORMAL LOW (ref 3.5–5.1)
Sodium: 136 mmol/L (ref 135–145)

## 2022-08-23 LAB — CULTURE, BLOOD (ROUTINE X 2): Special Requests: ADEQUATE

## 2022-08-23 LAB — CBC
HCT: 29.2 % — ABNORMAL LOW (ref 39.0–52.0)
Hemoglobin: 9.9 g/dL — ABNORMAL LOW (ref 13.0–17.0)
MCH: 29.1 pg (ref 26.0–34.0)
MCHC: 33.9 g/dL (ref 30.0–36.0)
MCV: 85.9 fL (ref 80.0–100.0)
Platelets: 163 10*3/uL (ref 150–400)
RBC: 3.4 MIL/uL — ABNORMAL LOW (ref 4.22–5.81)
RDW: 14.4 % (ref 11.5–15.5)
WBC: 3.2 10*3/uL — ABNORMAL LOW (ref 4.0–10.5)
nRBC: 0 % (ref 0.0–0.2)

## 2022-08-23 LAB — MAGNESIUM: Magnesium: 1.9 mg/dL (ref 1.7–2.4)

## 2022-08-23 LAB — GLUCOSE, CAPILLARY
Glucose-Capillary: 114 mg/dL — ABNORMAL HIGH (ref 70–99)
Glucose-Capillary: 97 mg/dL (ref 70–99)
Glucose-Capillary: 86 mg/dL (ref 70–99)
Glucose-Capillary: 77 mg/dL (ref 70–99)
Glucose-Capillary: 97 mg/dL (ref 70–99)

## 2022-08-23 LAB — HEMOGLOBIN A1C
Hgb A1c MFr Bld: 5.5 % (ref 4.8–5.6)
Mean Plasma Glucose: 111 mg/dL

## 2022-08-23 LAB — URINE CULTURE

## 2022-08-23 SURGERY — ESOPHAGOGASTRODUODENOSCOPY (EGD) WITH PROPOFOL
Anesthesia: General

## 2022-08-23 MED ORDER — LACTATED RINGERS IV SOLN: INTRAVENOUS | Status: DC | PRN

## 2022-08-23 MED ORDER — PROPOFOL 10 MG/ML IV BOLUS
INTRAVENOUS | Status: DC | PRN
  Administered 2022-08-23: 110 mg via INTRAVENOUS
  Administered 2022-08-23: 60 mg via INTRAVENOUS

## 2022-08-23 MED ORDER — LIDOCAINE HCL (CARDIAC) PF 100 MG/5ML IV SOSY
PREFILLED_SYRINGE | INTRAVENOUS | Status: DC | PRN
  Administered 2022-08-23: 50 mg via INTRAVENOUS

## 2022-08-23 MED ORDER — POTASSIUM CHLORIDE 10 MEQ/100ML IV SOLN
10.0000 meq | Freq: Once | INTRAVENOUS | Status: AC
  Administered 2022-08-23: 10 meq via INTRAVENOUS
  Filled 2022-08-23: qty 100

## 2022-08-23 MED ORDER — POTASSIUM CHLORIDE 10 MEQ/100ML IV SOLN
10.0000 meq | INTRAVENOUS | Status: AC
  Administered 2022-08-23: 10 meq via INTRAVENOUS
  Filled 2022-08-23: qty 100

## 2022-08-23 NOTE — Progress Notes (Signed)
PROGRESS NOTE  Dalton [name] ZOX:[id number] DOB: [birth year] DOA: 08/21/2022 PCP: Fleet Contras, MD  Brief History:  62 year old male with a history of coronary artery disease status post CABG, COPD, esophagitis, HFpEF, anxiety/depression, SVT, hypertension, hyperlipidemia, and diabetes mellitus type 2 presenting with upper abdominal pain and bacteremia.  The patient presented to the ER on 08/12/2022 initially for upper abdominal pain radiating to chest of several months duration.  The patient initially eloped in triage.  He subsequently returned to the ED later in the day, and his workup revealed that the patient had pyelonephritis.  The patient was discharged home in stable condition with cephalexin. The patient was called back to the ED on 08/21/2022 because of positive blood cultures with gram-negative rods.. Notably, the patient states that he has had subjective fevers and chills since 08/16/2022.  He has had some nausea without emesis.  He complains of upper abdominal pain as well as left sided abdominal pain.  He has some dysuria.  There is no hematuria.  He denies hematochezia or melena.  He has been having some loose stools but not able to quantify how much.  He has been complaining of intermittent solid food dysphagia for the better part of a month.  He denies any chest pain, headache, neck pain, cough, hemoptysis.  Notably, the patient has been having dysphagia type symptoms and upper abdominal pain for several months.  He underwent EGD on 08/03/2022 by Dr. Kinnie Scales which showed abundant solid food in the stomach.  The procedure was subsequently aborted to avoid aspiration and requiring intubation.  The plan was to have the patient have an upper GI series done.  Apparently this was scheduled for 08/19/2022.  The patient says that he had this done, but the results are not obtainable at this time.  Nevertheless, the patient states that he continues taking PPI twice daily.  He is unsure  if he takes Carafate.  In the ED, the patient was febrile up to 103.1 F.  He was hemodynamically stable with oxygen saturation 96% on room air.  WBC 8.3, hemoglobin 11.3, platelets 152,000.  Sodium 131, potassium 3.7, bicarbonate 25, serum creatinine 1.29.  08/20/2022 CT of the abdomen and pelvis showed patchy areas of decreased cortical enhancement in the right kidney consistent with acute pyelonephritis.  There is no hydronephrosis or ureteral stones.  The patient was started ceftriaxone.  Since admission, the patient is gradually improving clinically.  Stool for C. difficile was negative.  However he did complain of having a melanotic stool on the evening of 08/21/2022.  GI was consulted and plans for endoscopy.   Assessment/Plan: E. coli bacteremia -08/20/2022 blood cultures--gram-negative rods -Multiplex PCR shows E. coli without resistance -Continue ceftriaxone 2 g daily -Source is pyelonephritis -follow blood cultures>>pansensitive -plan to de-escalate to amox 1 g tid when tolerating po   Pyelonephritis -08/20/2022 UA 21-50 WBC -Continue ceftriaxone pending culture data -plan to de-escalate to amox 1 g tid when tolerating po   Dysphagia/upper abdominal pain -This appears to be a chronic issue -He had been in the process of being worked up by GI at [facility] as discussed above -Patient is requesting GI evaluation -GI consult appreciated -Restart pantoprazole twice daily -planning for EGD 7/1   Chest pain/coronary artery disease -longstanding history of chest pain (previously underwent CABG in 2002 but had an atretic LIMA by follow-up catheterization in 2013 and Coronary CT in 2021 showed a calcium score of 0). -04/19/2022  Myoview--low risk -Cycle troponins 6>>8 -05/19/2022 echo EF 50-55%, no WMA, grade 1 DD, normal RV function -Imdur has been discontinued March 2024 secondary to syncope -at this point, suspect GI etiology   Chronic HFpEF --05/19/2022 echo EF 50-55%, no WMA, grade 1  DD, normal RV function -Clinically compensated -Holding furosemide temporarily   Mixed hyperlipidemia -Continue statin   Essential hypertension -Holding lisinopril  temporarily due to soft BPs -lower dose metoprolol   Diabetes mellitus type 2 -Holding metformin and glipizide -05/19/2022 hemoglobin A1c 5.9 -08/21/22 A1c--5.9 -Mounjaro stopped about a month ago   Opioid dependence -PDMP reviewed -Percocet 10/325, #168, refill 08/18/22 -xanax 1 mg, #120, refill 07/29/22   Anxiety/depression -Continue Xanax and sertraline   Obesity -BMI 32.86 -lifestyle modification   Hypokalemia -replete -check mag           Family Communication:  son at bedside 6/30   Consultants:  GI   Code Status:  FULL    DVT Prophylaxis: [place] Lovenox     Procedures: As Listed in Progress Note Above   Antibiotics: Ceftriaxone 6/30>>          Subjective: Patient denies fevers, chills, headache, chest pain, dyspnea, nausea, vomiting, diarrhea, abdominal pain, dysuria, hematuria, hematochezia, and melena.   Objective: Vitals:   08/22/22 2304 08/23/22 0521 08/23/22 0842 08/23/22 1420  BP: (!) 92/59 125/88 122/76 115/65  Pulse: 70 66 62 62  Resp:   18 18  Temp: 98.6 F (37 C) 98.6 F (37 C) 97.6 F (36.4 C) 98.5 F (36.9 C)  TempSrc: Oral Oral Oral Oral  SpO2: 92% 100% 99% 100%  Weight:      Height:        Intake/Output Summary (Last 24 hours) at 08/23/2022 1452 Last data filed at 08/23/2022 1420 Gross per 24 hour  Intake 1774.42 ml  Output --  Net 1774.42 ml   Weight change:  Exam:  General:  Pt is alert, follows commands appropriately, not in acute distress HEENT: No icterus, No thrush, No neck mass, [place]/AT Cardiovascular: RRR, S1/S2, no rubs, no gallops Respiratory: CTA bilaterally, no wheezing, no crackles, no rhonchi Abdomen: Soft/+BS, non tender, non distended, no guarding Extremities: No edema, No lymphangitis, No petechiae, No rashes, no synovitis   Data  Reviewed: I have personally reviewed following labs and imaging studies Basic Metabolic Panel: Recent Labs  Lab 08/20/22 1523 08/21/22 1217 08/22/22 0408 08/23/22 0420  NA 131* 131* 134* 136  K 3.0* 3.7 3.1* 3.4*  CL 93* 95* 98 101  CO2 27 25 27 27   GLUCOSE 154* 109* 107* 105*  BUN 9 11 8  5*  CREATININE 1.17 1.29* 0.98 0.94  CALCIUM 9.1 8.4* 8.0* 7.9*  MG  --   --   --  1.9   Liver Function Tests: Recent Labs  Lab 08/20/22 1523 08/21/22 1217  AST 31 33  ALT 23 21  ALKPHOS 78 77  BILITOT 1.6* 1.9*  PROT 7.9 7.9  ALBUMIN 3.8 3.6   No results for input(s): "LIPASE", "AMYLASE" in the last 168 hours. No results for input(s): "AMMONIA" in the last 168 hours. Coagulation Profile: No results for input(s): "INR", "PROTIME" in the last 168 hours. CBC: Recent Labs  Lab 08/20/22 1523 08/21/22 1217 08/22/22 0408 08/23/22 0420  WBC 10.2 8.3 5.0 3.2*  NEUTROABS 8.4* 6.5  --   --   HGB 12.1* 11.3* 11.0* 9.9*  HCT 35.2* 32.6* 32.3* 29.2*  MCV 85.6 84.2 85.7 85.9  PLT 213 152 156 163  Cardiac Enzymes: No results for input(s): "CKTOTAL", "CKMB", "CKMBINDEX", "TROPONINI" in the last 168 hours. BNP: Invalid input(s): "POCBNP" CBG: Recent Labs  Lab 08/22/22 0847 08/22/22 1132 08/22/22 1535 08/23/22 0734 08/23/22 1137  GLUCAP 91 128* 131* 114* 97   HbA1C: Recent Labs    08/21/22 1549  HGBA1C 5.5   Urine analysis:    Component Value Date/Time   COLORURINE YELLOW 08/20/2022 2043   APPEARANCEUR CLEAR 08/20/2022 2043   LABSPEC 1.020 08/20/2022 2043   PHURINE 6.0 08/20/2022 2043   GLUCOSEU NEGATIVE 08/20/2022 2043   HGBUR SMALL (A) 08/20/2022 2043   BILIRUBINUR NEGATIVE 08/20/2022 2043   KETONESUR NEGATIVE 08/20/2022 2043   PROTEINUR NEGATIVE 08/20/2022 2043   UROBILINOGEN 1.0 09/25/2011 1513   NITRITE NEGATIVE 08/20/2022 2043   LEUKOCYTESUR MODERATE (A) 08/20/2022 2043   Sepsis Labs: @LABRCNTIP (procalcitonin:4,lacticidven:4) ) Recent Results (from the  past 240 hour(s))  Blood culture (routine x 2)     Status: Abnormal   Collection Time: 08/20/22  3:23 PM   Specimen: Left Antecubital; Blood  Result Value Ref Range Status   Specimen Description   Final    LEFT ANTECUBITAL BLOOD Performed at [facility], [address], [place], [place] [address]    Special Requests   Final    BOTTLES DRAWN AEROBIC AND ANAEROBIC Blood Culture adequate volume Performed at [facility], [address], [place], [place] [address]    Culture  Setup Time   Final    GRAM NEGATIVE RODS ANAEROBIC BOTTLE ONLY Gram Stain Report Called to,Read Back By and Verified With: LONG L @ 0940 ON [id number] BY HENDERSON L  CRITICAL RESULT CALLED TO, READ BACK BY AND VERIFIED WITH: RN JESSICA NEWMAN [phone] AT 1410 BY EC GRAM NEGATIVE RODS AEROBIC BOTTLE ONLY CRITICAL VALUE NOTED.  VALUE IS CONSISTENT WITH PREVIOUSLY REPORTED AND CALLED VALUE. Performed at [facility], [address] [place], [place] [address]    Culture ESCHERICHIA COLI (A)  Final   Report Status 08/23/2022 FINAL  Final   Organism ID, Bacteria ESCHERICHIA COLI  Final      Susceptibility   Escherichia coli - MIC*    AMPICILLIN <=2 SENSITIVE Sensitive     CEFEPIME <=0.12 SENSITIVE Sensitive     CEFTAZIDIME <=1 SENSITIVE Sensitive     CEFTRIAXONE <=0.25 SENSITIVE Sensitive     CIPROFLOXACIN <=0.25 SENSITIVE Sensitive     GENTAMICIN <=1 SENSITIVE Sensitive     IMIPENEM <=0.25 SENSITIVE Sensitive     TRIMETH/SULFA <=20 SENSITIVE Sensitive     AMPICILLIN/SULBACTAM <=2 SENSITIVE Sensitive     PIP/TAZO <=4 SENSITIVE Sensitive     * ESCHERICHIA COLI  Blood culture (routine x 2)     Status: None (Preliminary result)   Collection Time: 08/20/22  3:23 PM   Specimen: Right Antecubital; Blood  Result Value Ref Range Status   Specimen Description RIGHT ANTECUBITAL  Final   Special Requests   Final    BOTTLES DRAWN AEROBIC AND ANAEROBIC Blood Culture adequate volume   Culture   Final    NO GROWTH 3  DAYS Performed at [facility], [address] [place], [place] [address]    Report Status PENDING  Incomplete  Blood Culture ID Panel (Reflexed)     Status: Abnormal   Collection Time: 08/20/22  3:23 PM  Result Value Ref Range Status   Enterococcus faecalis NOT DETECTED NOT DETECTED Final   Enterococcus Faecium NOT DETECTED NOT DETECTED Final   Listeria monocytogenes NOT DETECTED NOT DETECTED Final  Staphylococcus species NOT DETECTED NOT DETECTED Final   Staphylococcus aureus (BCID) NOT DETECTED NOT DETECTED Final   Staphylococcus epidermidis NOT DETECTED NOT DETECTED Final   Staphylococcus lugdunensis NOT DETECTED NOT DETECTED Final   Streptococcus species NOT DETECTED NOT DETECTED Final   Streptococcus agalactiae NOT DETECTED NOT DETECTED Final   Streptococcus pneumoniae NOT DETECTED NOT DETECTED Final   Streptococcus pyogenes NOT DETECTED NOT DETECTED Final   A.calcoaceticus-baumannii NOT DETECTED NOT DETECTED Final   Bacteroides fragilis NOT DETECTED NOT DETECTED Final   Enterobacterales DETECTED (A) NOT DETECTED Final    Comment: Enterobacterales represent a large order of gram negative bacteria, not a single organism. CRITICAL RESULT CALLED TO, READ BACK BY AND VERIFIED WITH: RN JESSICA NEWMAN [id number] AT 1410 BY EC    Enterobacter cloacae complex NOT DETECTED NOT DETECTED Final   Escherichia coli DETECTED (A) NOT DETECTED Final    Comment: CRITICAL RESULT CALLED TO, READ BACK BY AND VERIFIED WITH: RN JESSICA NEWMAN [phone] AT 1410 BY EC    Klebsiella aerogenes NOT DETECTED NOT DETECTED Final   Klebsiella oxytoca NOT DETECTED NOT DETECTED Final   Klebsiella pneumoniae NOT DETECTED NOT DETECTED Final   Proteus species NOT DETECTED NOT DETECTED Final   Salmonella species NOT DETECTED NOT DETECTED Final   Serratia marcescens NOT DETECTED NOT DETECTED Final   Haemophilus influenzae NOT DETECTED NOT DETECTED Final   Neisseria meningitidis NOT DETECTED NOT DETECTED Final    Pseudomonas aeruginosa NOT DETECTED NOT DETECTED Final   Stenotrophomonas maltophilia NOT DETECTED NOT DETECTED Final   Candida albicans NOT DETECTED NOT DETECTED Final   Candida auris NOT DETECTED NOT DETECTED Final   Candida glabrata NOT DETECTED NOT DETECTED Final   Candida krusei NOT DETECTED NOT DETECTED Final   Candida parapsilosis NOT DETECTED NOT DETECTED Final   Candida tropicalis NOT DETECTED NOT DETECTED Final   Cryptococcus neoformans/gattii NOT DETECTED NOT DETECTED Final   CTX-M ESBL NOT DETECTED NOT DETECTED Final   Carbapenem resistance IMP NOT DETECTED NOT DETECTED Final   Carbapenem resistance KPC NOT DETECTED NOT DETECTED Final   Carbapenem resistance NDM NOT DETECTED NOT DETECTED Final   Carbapenem resist OXA 48 LIKE NOT DETECTED NOT DETECTED Final   Carbapenem resistance VIM NOT DETECTED NOT DETECTED Final    Comment: Performed at [facility], [address] [place], [place] [address]  Urine Culture     Status: Abnormal   Collection Time: 08/20/22  8:43 PM   Specimen: Urine, Clean Catch  Result Value Ref Range Status   Specimen Description   Final    URINE, CLEAN CATCH Performed at [facility], [address] [place], [place] [address]    Special Requests   Final    Normal Performed at [facility], [address], [place], [place] [address]    Culture >=100,000 COLONIES/mL ESCHERICHIA COLI (A)  Final   Report Status 08/23/2022 FINAL  Final   Organism ID, Bacteria ESCHERICHIA COLI (A)  Final      Susceptibility   Escherichia coli - MIC*    AMPICILLIN <=2 SENSITIVE Sensitive     CEFAZOLIN <=4 SENSITIVE Sensitive     CEFEPIME <=0.12 SENSITIVE Sensitive     CEFTRIAXONE <=0.25 SENSITIVE Sensitive     CIPROFLOXACIN <=0.25 SENSITIVE Sensitive     GENTAMICIN <=1 SENSITIVE Sensitive     IMIPENEM <=0.25 SENSITIVE Sensitive     NITROFURANTOIN <=16 SENSITIVE Sensitive     TRIMETH/SULFA <=20 SENSITIVE Sensitive     AMPICILLIN/SULBACTAM <=2  SENSITIVE  Sensitive     PIP/TAZO <=4 SENSITIVE Sensitive     * >=100,000 COLONIES/mL ESCHERICHIA COLI  C Difficile Quick Screen w PCR reflex     Status: None   Collection Time: 08/21/22  2:56 PM   Specimen: STOOL  Result Value Ref Range Status   C Diff antigen NEGATIVE NEGATIVE Final   C Diff toxin NEGATIVE NEGATIVE Final   C Diff interpretation No C. difficile detected.  Final    Comment: Performed at [facility], [address], [place], [place] [address]  Gastrointestinal Panel by PCR , Stool     Status: None   Collection Time: 08/21/22  2:56 PM   Specimen: Stool  Result Value Ref Range Status   Campylobacter species NOT DETECTED NOT DETECTED Final   Plesimonas shigelloides NOT DETECTED NOT DETECTED Final   Salmonella species NOT DETECTED NOT DETECTED Final   Yersinia enterocolitica NOT DETECTED NOT DETECTED Final   Vibrio species NOT DETECTED NOT DETECTED Final   Vibrio cholerae NOT DETECTED NOT DETECTED Final   Enteroaggregative E coli (EAEC) NOT DETECTED NOT DETECTED Final   Enteropathogenic E coli (EPEC) NOT DETECTED NOT DETECTED Final   Enterotoxigenic E coli (ETEC) NOT DETECTED NOT DETECTED Final   Shiga like toxin producing E coli (STEC) NOT DETECTED NOT DETECTED Final   Shigella/Enteroinvasive E coli (EIEC) NOT DETECTED NOT DETECTED Final   Cryptosporidium NOT DETECTED NOT DETECTED Final   Cyclospora cayetanensis NOT DETECTED NOT DETECTED Final   Entamoeba histolytica NOT DETECTED NOT DETECTED Final   Giardia lamblia NOT DETECTED NOT DETECTED Final   Adenovirus F40/41 NOT DETECTED NOT DETECTED Final   Astrovirus NOT DETECTED NOT DETECTED Final   Norovirus GI/GII NOT DETECTED NOT DETECTED Final   Rotavirus A NOT DETECTED NOT DETECTED Final   Sapovirus (I, II, IV, and V) NOT DETECTED NOT DETECTED Final    Comment: Performed at [facility], [address] [place], [place] [address]     Scheduled Meds:  atorvastatin  80 mg Oral Daily   ezetimibe  10 mg Oral Daily    fluticasone  2 spray Each Nare Daily   insulin aspart  0-9 Units Subcutaneous TID WC   metoprolol tartrate  12.5 mg Oral BID   pantoprazole  40 mg Oral BID   sertraline  100 mg Oral Daily   tamsulosin  0.4 mg Oral QPC breakfast   Continuous Infusions:  sodium chloride     sodium chloride 20 mL/hr at 08/22/22 2158   cefTRIAXone (ROCEPHIN)  IV 2 g (08/23/22 0906)    Procedures/Studies: CT CHEST ABDOMEN PELVIS W CONTRAST  Result Date: 08/20/2022 CLINICAL DATA:  Sepsis, fever EXAM: CT CHEST, ABDOMEN, AND PELVIS WITH CONTRAST TECHNIQUE: Multidetector CT imaging of the chest, abdomen and pelvis was performed following the standard protocol during bolus administration of intravenous contrast. RADIATION DOSE REDUCTION: This exam was performed according to the departmental dose-optimization program which includes automated exposure control, adjustment of the mA and/or kV according to patient size and/or use of iterative reconstruction technique. CONTRAST:  OMNIPAQUE IOHEXOL 300 MG/ML  SOLN COMPARISON:  CT chest done on 01/02/2022 FINDINGS: CT CHEST FINDINGS Cardiovascular: There is evidence of previous coronary bypass surgery. There is vascular stent in proximal course of left subclavian artery. Mediastinum/Nodes: No significant lymphadenopathy is seen. Lungs/Pleura: Small linear densities are seen in left lower lung fields suggesting scarring or subsegmental atelectasis. There is no focal pulmonary consolidation. There is no pleural effusion or pneumothorax. Musculoskeletal: No acute  findings are seen. Degenerative changes are noted in lumbar spine with encroachment of neural foramina at multiple levels, more so at L5-S1 level. CT ABDOMEN PELVIS FINDINGS Hepatobiliary: No focal abnormalities are seen in liver. There is no dilation of bile ducts. There is increased density in gallbladder lumen. There is no wall thickening in gallbladder. Pancreas: No focal abnormalities are seen. Spleen: Unremarkable.  Adrenals/Urinary Tract: Adrenals are unremarkable. There is no hydronephrosis. There are patchy areas of decreased enhancement in right renal cortex. Right kidney slightly larger than left. There is mild right perinephric stranding. There are no renal or ureteral stones. Urinary bladder is unremarkable. Stomach/Bowel: Small hiatal hernia is seen. Small bowel loops are not dilated. There is fluid in the lumen of small bowel loops and colon which may be a normal variation or suggest nonspecific enteritis. There is no significant wall thickening in the small bowel loops and colon. The appendix is not seen. There is no pericolic stranding. Vascular/Lymphatic: Unremarkable Reproductive: Unremarkable. Other: There is no ascites or pneumoperitoneum. Small umbilical hernia containing fat is seen. Musculoskeletal: Degenerative changes are noted with spinal stenosis and encroachment of neural foramina at multiple levels in lower lumbar spine. IMPRESSION: There are patchy areas of decreased cortical enhancement in right kidney suggesting acute pyelonephritis. There is no hydronephrosis. There are no renal or ureteral stones. There is no focal pulmonary consolidation. There is no pleural effusion or pneumothorax. Previous coronary bypass surgery. Vascular stent is noted in the proximal course of left subclavian artery. There is no evidence of intestinal obstruction. There is fluid in the lumen of small bowel loops and colon which may be a normal variation or suggest nonspecific enteritis. There is no bowel wall thickening or significant dilation. There is subtle increased density in the lumen of gallbladder suggesting possible gallbladder stones. There are no signs of acute cholecystitis. There is no dilation of bile ducts. Follow-up gallbladder sonogram in nonemergent setting may be considered. Small hiatal hernia.  Lumbar spondylosis. Electronically Signed   By: Ernie Avena M.D.   On: 08/20/2022 20:42   DG Chest 2  View  Result Date: 08/20/2022 CLINICAL DATA:  Shortness of breath EXAM: CHEST - 2 VIEW COMPARISON:  07/24/2022 FINDINGS: Post sternotomy changes. Patchy atelectasis or minimal infiltrates at the bases. Normal cardiac size. No pneumothorax. Stent superior to the aortic arch. Scattered fluid levels over the upper abdomen. Air collection in the epigastric area. IMPRESSION: Patchy atelectasis or minimal infiltrates at the bases. Scattered air-fluid levels in the abdomen incompletely assessed. Focal air collection in the epigastrium probably air distended bowel, but recommend dedicated abdominal radiographs for further assessment. Electronically Signed   By: Jasmine Pang M.D.   On: 08/20/2022 15:34   DG Chest Port 1 View  Result Date: 07/24/2022 CLINICAL DATA:  Shortness of breath on exertion, chest pain and coughing. EXAM: PORTABLE CHEST 1 VIEW COMPARISON:  07/08/2022. FINDINGS: Cardiac silhouette normal in size and configuration. Normal mediastinal and hilar contours. Previous median sternotomy. Left vascular stent lies just above the aortic knob, stable from the prior radiograph. Clear lungs.  No pleural effusion or pneumothorax. Skeletal structures are grossly intact. IMPRESSION: No active disease. Electronically Signed   By: Amie Portland M.D.   On: 07/24/2022 17:37    Catarina Hartshorn, DO  [facility]  If 7PM-7AM, please contact night-coverage [email or web address] Password [email or web address] 08/23/2022, 2:52 PM   LOS: 2 days

## 2022-08-23 NOTE — Discharge Summary (Signed)
Physician Discharge Summary   Patient: Dalton [name] MRN: [id number] DOB: [birth year]  Admit date:     08/21/2022  Discharge date: 08/24/22  Discharge Physician: Onalee Hua Chia Rock   PCP: Fleet Contras, MD   Recommendations at discharge:   Please follow up with primary care provider within 1-2 weeks  Please repeat BMP and CBC in one week     Hospital Course: 62 year old male with a history of coronary artery disease status post CABG, COPD, esophagitis, HFpEF, anxiety/depression, SVT, hypertension, hyperlipidemia, and diabetes mellitus type 2 presenting with upper abdominal pain and bacteremia.  The patient presented to the ER on 08/12/2022 initially for upper abdominal pain radiating to chest of several months duration.  The patient initially eloped in triage.  He subsequently returned to the ED later in the day, and his workup revealed that the patient had pyelonephritis.  The patient was discharged home in stable condition with cephalexin. The patient was called back to the ED on 08/21/2022 because of positive blood cultures with gram-negative rods.. Notably, the patient states that he has had subjective fevers and chills since 08/16/2022.  He has had some nausea without emesis.  He complains of upper abdominal pain as well as left sided abdominal pain.  He has some dysuria.  There is no hematuria.  He denies hematochezia or melena.  He has been having some loose stools but not able to quantify how much.  He has been complaining of intermittent solid food dysphagia for the better part of a month.  He denies any chest pain, headache, neck pain, cough, hemoptysis.  Notably, the patient has been having dysphagia type symptoms and upper abdominal pain for several months.  He underwent EGD on 08/03/2022 by Dr. Kinnie Scales which showed abundant solid food in the stomach.  The procedure was subsequently aborted to avoid aspiration and requiring intubation.  The plan was to have the patient have an upper GI  series done.  Apparently this was scheduled for 08/19/2022.  The patient says that he had this done, but the results are not obtainable at this time.  Nevertheless, the patient states that he continues taking PPI twice daily.  He is unsure if he takes Carafate.  In the ED, the patient was febrile up to 103.1 F.  He was hemodynamically stable with oxygen saturation 96% on room air.  WBC 8.3, hemoglobin 11.3, platelets 152,000.  Sodium 131, potassium 3.7, bicarbonate 25, serum creatinine 1.29.  08/20/2022 CT of the abdomen and pelvis showed patchy areas of decreased cortical enhancement in the right kidney consistent with acute pyelonephritis.  There is no hydronephrosis or ureteral stones.  The patient was started ceftriaxone.  Since admission, the patient is gradually improving clinically.  Stool for C. difficile was negative.  However he did complain of having a melanotic stool on the evening of 08/21/2022.  GI was consulted and plans for endoscopy.  Assessment and Plan: E. coli bacteremia -08/20/2022 blood cultures--gram-negative rods -Multiplex PCR shows E. coli without resistance -Continue ceftriaxone 2 g daily -Source is pyelonephritis -follow blood cultures>>pansensitive -dc home with amoxil 1 g TID x 6 more days   Pyelonephritis -08/20/2022 UA 21-50 WBC -Continue ceftriaxone pending culture data -dc home with amoxil 1 g TID x 6 more days  Dysphagia/upper abdominal pain -This appears to be a chronic issue -He had been in the process of being worked up by GI at [facility] as discussed above -Patient is requesting GI evaluation -GI consult appreciated -Restart pantoprazole twice daily -EGD 7/1--erosive  gastritis without stigmata of bleed, normal esophagus s/p Maloney dilation and normal duodenum -diet advanced which he tolerated   Chest pain/coronary artery disease -longstanding history of chest pain (previously underwent CABG in 2002 but had an atretic LIMA by follow-up catheterization in  2013 and Coronary CT in 2021 showed a calcium score of 0). -04/19/2022 Myoview--low risk -Cycle troponins 6>>8 -05/19/2022 echo EF 50-55%, no WMA, grade 1 DD, normal RV function -Imdur has been discontinued March 2024 secondary to syncope -at this point, suspect GI etiology   Chronic HFpEF --05/19/2022 echo EF 50-55%, no WMA, grade 1 DD, normal RV function -Clinically compensated -Holding furosemide temporarily>>restart after dc   Mixed hyperlipidemia -Continue statin   Essential hypertension -Holding lisinopril  temporarily due to soft BPs -lower dose metoprolol   Diabetes mellitus type 2 -Holding metformin and glipizide -05/19/2022 hemoglobin A1c 5.9 -08/21/22 A1c--5.9 -Mounjaro stopped about a month ago   Opioid dependence -PDMP reviewed -Percocet 10/325, #168, refill 08/18/22 -xanax 1 mg, #120, refill 07/29/22   Anxiety/depression -Continue Xanax and sertraline   Obesity -BMI 32.86 -lifestyle modification   Hypokalemia -replete -check mag          {Tip this will not be part of the note when signed Body mass index is 32.83 kg/m. , ,  (Optional):26781}  {(NOTE) Pain control PDMP Statment (Optional):26782} Consultants: GI Procedures performed: EGD 7/1  Disposition: Home Diet recommendation:  Carb modified diet DISCHARGE MEDICATION: Allergies as of 08/23/2022       Reactions   Gabapentin Hives   Ibuprofen Other (See Comments)   Stomach  Upset and stomach ulcers   Zolpidem Tartrate    Other Reaction(s): Not available   Naproxen Other (See Comments)   HALLUCINATIONS     Med Rec must be completed prior to using this [facility]***       Discharge Exam: Filed Weights   08/21/22 1136 08/23/22 1500  Weight: 103.9 kg 103.8 kg   HEENT:  [place]/AT, No thrush, no icterus CV:  RRR, no rub, no S3, no S4 Lung:  CTA, no wheeze, no rhonchi Abd:  soft/+BS, NT Ext:  No edema, no lymphangitis, no synovitis, no rash   Condition at discharge: stable  The results  of significant diagnostics from this hospitalization (including imaging, microbiology, ancillary and laboratory) are listed below for reference.   Imaging Studies: CT CHEST ABDOMEN PELVIS W CONTRAST  Result Date: 08/20/2022 CLINICAL DATA:  Sepsis, fever EXAM: CT CHEST, ABDOMEN, AND PELVIS WITH CONTRAST TECHNIQUE: Multidetector CT imaging of the chest, abdomen and pelvis was performed following the standard protocol during bolus administration of intravenous contrast. RADIATION DOSE REDUCTION: This exam was performed according to the departmental dose-optimization program which includes automated exposure control, adjustment of the mA and/or kV according to patient size and/or use of iterative reconstruction technique. CONTRAST:  OMNIPAQUE IOHEXOL 300 MG/ML  SOLN COMPARISON:  CT chest done on 01/02/2022 FINDINGS: CT CHEST FINDINGS Cardiovascular: There is evidence of previous coronary bypass surgery. There is vascular stent in proximal course of left subclavian artery. Mediastinum/Nodes: No significant lymphadenopathy is seen. Lungs/Pleura: Small linear densities are seen in left lower lung fields suggesting scarring or subsegmental atelectasis. There is no focal pulmonary consolidation. There is no pleural effusion or pneumothorax. Musculoskeletal: No acute findings are seen. Degenerative changes are noted in lumbar spine with encroachment of neural foramina at multiple levels, more so at L5-S1 level. CT ABDOMEN PELVIS FINDINGS Hepatobiliary: No focal abnormalities are seen in liver. There is no dilation of bile ducts.  There is increased density in gallbladder lumen. There is no wall thickening in gallbladder. Pancreas: No focal abnormalities are seen. Spleen: Unremarkable. Adrenals/Urinary Tract: Adrenals are unremarkable. There is no hydronephrosis. There are patchy areas of decreased enhancement in right renal cortex. Right kidney slightly larger than left. There is mild right perinephric stranding.  There are no renal or ureteral stones. Urinary bladder is unremarkable. Stomach/Bowel: Small hiatal hernia is seen. Small bowel loops are not dilated. There is fluid in the lumen of small bowel loops and colon which may be a normal variation or suggest nonspecific enteritis. There is no significant wall thickening in the small bowel loops and colon. The appendix is not seen. There is no pericolic stranding. Vascular/Lymphatic: Unremarkable Reproductive: Unremarkable. Other: There is no ascites or pneumoperitoneum. Small umbilical hernia containing fat is seen. Musculoskeletal: Degenerative changes are noted with spinal stenosis and encroachment of neural foramina at multiple levels in lower lumbar spine. IMPRESSION: There are patchy areas of decreased cortical enhancement in right kidney suggesting acute pyelonephritis. There is no hydronephrosis. There are no renal or ureteral stones. There is no focal pulmonary consolidation. There is no pleural effusion or pneumothorax. Previous coronary bypass surgery. Vascular stent is noted in the proximal course of left subclavian artery. There is no evidence of intestinal obstruction. There is fluid in the lumen of small bowel loops and colon which may be a normal variation or suggest nonspecific enteritis. There is no bowel wall thickening or significant dilation. There is subtle increased density in the lumen of gallbladder suggesting possible gallbladder stones. There are no signs of acute cholecystitis. There is no dilation of bile ducts. Follow-up gallbladder sonogram in nonemergent setting may be considered. Small hiatal hernia.  Lumbar spondylosis. Electronically Signed   By: Ernie Avena M.D.   On: 08/20/2022 20:42   DG Chest 2 View  Result Date: 08/20/2022 CLINICAL DATA:  Shortness of breath EXAM: CHEST - 2 VIEW COMPARISON:  07/24/2022 FINDINGS: Post sternotomy changes. Patchy atelectasis or minimal infiltrates at the bases. Normal cardiac size. No  pneumothorax. Stent superior to the aortic arch. Scattered fluid levels over the upper abdomen. Air collection in the epigastric area. IMPRESSION: Patchy atelectasis or minimal infiltrates at the bases. Scattered air-fluid levels in the abdomen incompletely assessed. Focal air collection in the epigastrium probably air distended bowel, but recommend dedicated abdominal radiographs for further assessment. Electronically Signed   By: Jasmine Pang M.D.   On: 08/20/2022 15:34    Microbiology: Results for orders placed or performed during the hospital encounter of 08/21/22  C Difficile Quick Screen w PCR reflex     Status: None   Collection Time: 08/21/22  2:56 PM   Specimen: STOOL  Result Value Ref Range Status   C Diff antigen NEGATIVE NEGATIVE Final   C Diff toxin NEGATIVE NEGATIVE Final   C Diff interpretation No C. difficile detected.  Final    Comment: Performed at [facility], [address] [place], [place] [address]  Gastrointestinal Panel by PCR , Stool     Status: None   Collection Time: 08/21/22  2:56 PM   Specimen: Stool  Result Value Ref Range Status   Campylobacter species NOT DETECTED NOT DETECTED Final   Plesimonas shigelloides NOT DETECTED NOT DETECTED Final   Salmonella species NOT DETECTED NOT DETECTED Final   Yersinia enterocolitica NOT DETECTED NOT DETECTED Final   Vibrio species NOT DETECTED NOT DETECTED Final   Vibrio cholerae NOT DETECTED NOT DETECTED Final   Enteroaggregative E  coli (EAEC) NOT DETECTED NOT DETECTED Final   Enteropathogenic E coli (EPEC) NOT DETECTED NOT DETECTED Final   Enterotoxigenic E coli (ETEC) NOT DETECTED NOT DETECTED Final   Shiga like toxin producing E coli (STEC) NOT DETECTED NOT DETECTED Final   Shigella/Enteroinvasive E coli (EIEC) NOT DETECTED NOT DETECTED Final   Cryptosporidium NOT DETECTED NOT DETECTED Final   Cyclospora cayetanensis NOT DETECTED NOT DETECTED Final   Entamoeba histolytica NOT DETECTED NOT DETECTED Final   Giardia  lamblia NOT DETECTED NOT DETECTED Final   Adenovirus F40/41 NOT DETECTED NOT DETECTED Final   Astrovirus NOT DETECTED NOT DETECTED Final   Norovirus GI/GII NOT DETECTED NOT DETECTED Final   Rotavirus A NOT DETECTED NOT DETECTED Final   Sapovirus (I, II, IV, and V) NOT DETECTED NOT DETECTED Final    Comment: Performed at [facility], [address] [place], [place] [address]    Labs: CBC: Recent Labs  Lab 08/20/22 1523 08/21/22 1217 08/22/22 0408 08/23/22 0420  WBC 10.2 8.3 5.0 3.2*  NEUTROABS 8.4* 6.5  --   --   HGB 12.1* 11.3* 11.0* 9.9*  HCT 35.2* 32.6* 32.3* 29.2*  MCV 85.6 84.2 85.7 85.9  PLT 213 152 156 163   Basic Metabolic Panel: Recent Labs  Lab 08/20/22 1523 08/21/22 1217 08/22/22 0408 08/23/22 0420  NA 131* 131* 134* 136  K 3.0* 3.7 3.1* 3.4*  CL 93* 95* 98 101  CO2 27 25 27 27   GLUCOSE 154* 109* 107* 105*  BUN 9 11 8  5*  CREATININE 1.17 1.29* 0.98 0.94  CALCIUM 9.1 8.4* 8.0* 7.9*  MG  --   --   --  1.9   Liver Function Tests: Recent Labs  Lab 08/20/22 1523 08/21/22 1217  AST 31 33  ALT 23 21  ALKPHOS 78 77  BILITOT 1.6* 1.9*  PROT 7.9 7.9  ALBUMIN 3.8 3.6   CBG: Recent Labs  Lab 08/22/22 1535 08/23/22 0734 08/23/22 1137 08/23/22 1516 08/23/22 1626  GLUCAP 131* 114* 97 86 77    Discharge time spent: greater than 30 minutes.  Signed: Catarina Hartshorn, MD [facility] 08/23/2022

## 2022-08-23 NOTE — Interval H&P Note (Signed)
History and Physical Interval Note:  08/23/2022 3:06 PM  ARIHANT BYRDSONG  has presented today for surgery, with the diagnosis of esophageal dysphagia.  The various methods of treatment have been discussed with the patient and family. After consideration of risks, benefits and other options for treatment, the patient has consented to  Procedure(s) with comments: ESOPHAGOGASTRODUODENOSCOPY (EGD) WITH PROPOFOL (N/A) - with possible esophageal dilation as a surgical intervention.  The patient's history has been reviewed, patient examined, no change in status, stable for surgery.  I have reviewed the patient's chart and labs.  Questions were answered to the patient's satisfaction.     Eula Listen     Patient stable overnight.  He received Reglan this morning.  Plan for EGD with esophageal dilation today.  Risk benefits limitations have been reviewed potential for esophageal relation specifically discussed.    Patient's questions answered.  He is agreeable.  This to follow after upper endoscopy evaluation has been performed.

## 2022-08-23 NOTE — Anesthesia Procedure Notes (Signed)
Date/Time: 08/23/2022 3:17 PM  Performed by: Franco Nones, CRNAPre-anesthesia Checklist: Patient identified, Emergency Drugs available, Suction available, Timeout performed and Patient being monitored Patient Re-evaluated:Patient Re-evaluated prior to induction Oxygen Delivery Method: Nasal Cannula

## 2022-08-23 NOTE — Anesthesia Preprocedure Evaluation (Signed)
Anesthesia Evaluation  Patient identified by MRN, date of birth, ID band Patient awake    Reviewed: Allergy & Precautions, H&P , NPO status , Patient's Chart, lab work & pertinent test results, reviewed documented beta blocker date and time   Airway Mallampati: III  TM Distance: >3 FB Neck ROM: full    Dental no notable dental hx.    Pulmonary neg pulmonary ROS, asthma , COPD   Pulmonary exam normal breath sounds clear to auscultation       Cardiovascular Exercise Tolerance: Good hypertension, + CAD, + Past MI and + Peripheral Vascular Disease  negative cardio ROS  Rhythm:regular Rate:Normal     Neuro/Psych  Headaches PSYCHIATRIC DISORDERS Anxiety Depression Bipolar Disorder    Neuromuscular disease negative neurological ROS  negative psych ROS   GI/Hepatic negative GI ROS, Neg liver ROS, PUD,GERD  ,,  Endo/Other  negative endocrine ROSdiabetes    Renal/GU Renal diseasenegative Renal ROS  negative genitourinary   Musculoskeletal   Abdominal   Peds  Hematology negative hematology ROS (+) Blood dyscrasia, anemia   Anesthesia Other Findings   Reproductive/Obstetrics negative OB ROS                             Anesthesia Physical Anesthesia Plan  ASA: 4 and emergent  Anesthesia Plan: General   Post-op Pain Management:    Induction:   PONV Risk Score and Plan: Propofol infusion  Airway Management Planned:   Additional Equipment:   Intra-op Plan:   Post-operative Plan:   Informed Consent: I have reviewed the patients History and Physical, chart, labs and discussed the procedure including the risks, benefits and alternatives for the proposed anesthesia with the patient or authorized representative who has indicated his/her understanding and acceptance.     Dental Advisory Given  Plan Discussed with: CRNA  Anesthesia Plan Comments:        Anesthesia Quick Evaluation

## 2022-08-23 NOTE — Transfer of Care (Signed)
Immediate Anesthesia Transfer of Care Note  Patient: Dalton [name]  Procedure(s) Performed: ESOPHAGOGASTRODUODENOSCOPY (EGD) WITH PROPOFOL MALONEY DILATION BIOPSY  Patient Location: PACU  Anesthesia Type:General  Level of Consciousness: awake and patient cooperative  Airway & Oxygen Therapy: Patient Spontanous Breathing  Post-op Assessment: Report given to RN and Post -op Vital signs reviewed and stable  Post vital signs: Reviewed and stable  Last Vitals:  Vitals Value Taken Time  BP 102/71 08/23/22 1532  Temp 97.9 08/23/22     1533  Pulse 72 08/23/22    1533  Resp 22 08/23/22 1534  SpO2 96 08/23/22 1534  Vitals shown include unvalidated device data.  Last Pain:  Vitals:   08/23/22 1518  TempSrc:   PainSc: 8          Complications: No notable events documented.
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
## 2022-08-23 NOTE — Op Note (Signed)
[facility] Patient Name: Dalton [name] Procedure Date: 08/23/2022 3:09 PM MRN: [id number] Date of Birth: 12-Apr-1960 Attending MD: Gennette Pac , MD, [id number] CSN: [id number] Age: 62 Admit Type: Inpatient Procedure:                Upper GI endoscopy Indications:              Dysphagia Providers:                Gennette Pac, MD, Sheran Fava, Jannett Celestine, RN, Judeth Cornfield. Jessee Avers, Technician Referring MD:              Medicines:                Propofol per Anesthesia Complications:            No immediate complications. Estimated Blood Loss:     Estimated blood loss was minimal. Procedure:                Pre-Anesthesia Assessment:                           - Prior to the procedure, a History and Physical                            was performed, and patient medications and                            allergies were reviewed. The patient's tolerance of                            previous anesthesia was also reviewed. The risks                            and benefits of the procedure and the sedation                            options and risks were discussed with the patient.                            All questions were answered, and informed consent                            was obtained. Prior Anticoagulants: The patient has                            taken no anticoagulant or antiplatelet agents. ASA                            Grade Assessment: III - A patient with severe                            systemic disease. After reviewing the risks and  benefits, the patient was deemed in satisfactory                            condition to undergo the procedure.                           After obtaining informed consent, the endoscope was                            passed under direct vision. Throughout the                            procedure, the patient's blood pressure, pulse, and                             oxygen saturations were monitored continuously. The                            GIF-H190 ([id number]) scope was introduced through the                            mouth, and advanced to the second portion of the                            duodenum.. The upper GI endoscopy was accomplished                            without difficulty. The patient tolerated the                            procedure well. Scope In: 3:22:05 PM Scope Out: 3:28:23 PM Total Procedure Duration: 0 hours 6 minutes 18 seconds  Findings:      The examined esophagus was normal. Gastric cavity empty.      Multiple erosions with no stigmata of recent bleeding were found in the       gastric antrum. No ulcer or infiltrating process.      The duodenal bulb and second portion of the duodenum were normal. The       scope was withdrawn. Dilation was performed with a Maloney dilator with       no resistance at 56 Fr. Dilation was performed with a Maloney dilator       with no resistance at 58 Fr. The dilation site was examined following       endoscope reinsertion and showed no change. Estimated blood loss: none.       Finally, biopsies of the abnormal antrum were taken for histologic study. Impression:               - Normal esophagus. Status post Maloney dilation.                           - Erosive gastropathy with no stigmata of recent                            bleeding. Status post gastric biopsy                           -  Normal duodenal bulb and second portion of the                            duodenum. Moderate Sedation:      Moderate (conscious) sedation was personally administered by an       anesthesia professional. The following parameters were monitored: oxygen       saturation, heart rate, blood pressure, respiratory rate, EKG, adequacy       of pulmonary ventilation, and response to care. Recommendation:           - Return patient to hospital ward for observation.                           - Advance diet  as tolerated.                           - Continue present medications. Continue daily PPI.                           - Return to my office (date not yet determined). Procedure Code(s):        --- Professional ---                           43200, Esophagoscopy, flexible, transoral;                            diagnostic, including collection of specimen(s) by                            brushing or washing, when performed (separate                            procedure)                           43450, Dilation of esophagus, by unguided sound or                            bougie, single or multiple passes Diagnosis Code(s):        --- Professional ---                           K31.89, Other diseases of stomach and duodenum                           R13.10, Dysphagia, unspecified CPT copyright 2022 [employer]. All rights reserved. The codes documented in this report are preliminary and upon coder review may  be revised to meet current compliance requirements. Gerrit Friends. Mariaelena Cade, MD Gennette Pac, MD 08/23/2022 3:40:08 PM This report has been signed electronically. Number of Addenda: 0

## 2022-08-23 NOTE — Progress Notes (Signed)
Patient seen this morning prior to EGD. NPO except sips with meds. Denies N/V. IV Reglan 10 mg was given at 0845 by nursing staff. Discussed again risks and benefits of EGD with possible dilation. He stated understanding and agreeable to proceed.

## 2022-08-24 ENCOUNTER — Telehealth (HOSPITAL_BASED_OUTPATIENT_CLINIC_OR_DEPARTMENT_OTHER): Payer: Self-pay | Admitting: *Deleted

## 2022-08-24 LAB — GLUCOSE, CAPILLARY: Glucose-Capillary: 101 mg/dL — ABNORMAL HIGH (ref 70–99)

## 2022-08-24 LAB — BASIC METABOLIC PANEL
Anion gap: 7 (ref 5–15)
BUN: 5 mg/dL — ABNORMAL LOW (ref 8–23)
CO2: 27 mmol/L (ref 22–32)
Calcium: 8.2 mg/dL — ABNORMAL LOW (ref 8.9–10.3)
Chloride: 103 mmol/L (ref 98–111)
Creatinine, Ser: 0.85 mg/dL (ref 0.61–1.24)
GFR, Estimated: >60 mL/min (ref >60)
Glucose, Bld: 100 mg/dL — ABNORMAL HIGH (ref 70–99)
Potassium: 3.3 mmol/L — ABNORMAL LOW (ref 3.5–5.1)
Sodium: 137 mmol/L (ref 135–145)

## 2022-08-24 LAB — MAGNESIUM: Magnesium: 2.1 mg/dL (ref 1.7–2.4)

## 2022-08-24 MED ORDER — AMOXICILLIN 500 MG PO CAPS: 1000.0000 mg | ORAL_CAPSULE | Freq: Three times a day (TID) | ORAL | 0 refills | Status: DC

## 2022-08-24 MED ORDER — AMOXICILLIN 250 MG PO CAPS: 1000.0000 mg | ORAL_CAPSULE | Freq: Three times a day (TID) | ORAL | Status: DC

## 2022-08-24 MED ORDER — POTASSIUM CHLORIDE CRYS ER 20 MEQ PO TBCR
40.0000 meq | EXTENDED_RELEASE_TABLET | Freq: Once | ORAL | Status: AC
  Administered 2022-08-24: 40 meq via ORAL
  Filled 2022-08-24: qty 2

## 2022-08-24 NOTE — Telephone Encounter (Signed)
Post ED Visit - Positive Culture Follow-up  Culture report reviewed by antimicrobial stewardship pharmacist: [facility] []  Enzo Bi, Pharm.D. []  Celedonio Miyamoto, Pharm.D., BCPS AQ-ID []  Garvin Fila, Pharm.D., BCPS []  Georgina Pillion, Pharm.D., BCPS []  [place], [address].D., BCPS, AAHIVP []  Estella Husk, Pharm.D., BCPS, AAHIVP []  Lysle Pearl, PharmD, BCPS []  Phillips Climes, PharmD, BCPS []  Agapito Games, PharmD, BCPS []  Verlan Friends, PharmD []  Mervyn Gay, PharmD, BCPS []  Vinnie Level, PharmD  [facility] []  Len Childs, PharmD []  Greer Pickerel, PharmD []  Adalberto Cole, PharmD []  Perlie Gold, Rph []  Lonell Face) Jean Rosenthal, PharmD []  Earl Many, PharmD []  Junita Push, PharmD []  Dorna Leitz, PharmD []  Terrilee Files, PharmD []  Lynann Beaver, PharmD []  Keturah Barre, PharmD []  Loralee Pacas, PharmD []  Bernadene Person, PharmD   Positive urine culture Treated with Cephalexin, organism sensitive to the same and no further patient follow-up is required at this time per Arthor Captain PAC.  Bing Quarry 08/24/2022, 8:57 AM

## 2022-08-24 NOTE — Progress Notes (Signed)
Patient discharged home with instructions given on medications and follow up visits,patient verbalized understanding. Prescriptions sent to Pharmacy of choice documented on AVS. IV discontinued, catheter intact. Accompanied by staff to an awaiting vehicle. 

## 2022-08-24 NOTE — Telephone Encounter (Signed)
Post ED Visit - Positive Culture Follow-up  Culture report reviewed by antimicrobial stewardship pharmacist: [facility] []  Enzo Bi, Pharm.D. []  Celedonio Miyamoto, Pharm.D., BCPS AQ-ID []  Garvin Fila, Pharm.D., BCPS []  Georgina Pillion, Pharm.D., BCPS []  [place], [address].D., BCPS, AAHIVP []  Estella Husk, Pharm.D., BCPS, AAHIVP []  Lysle Pearl, PharmD, BCPS []  Phillips Climes, PharmD, BCPS []  Agapito Games, PharmD, BCPS []  Verlan Friends, PharmD []  Mervyn Gay, PharmD, BCPS []  Vinnie Level, PharmD  [facility] []  Len Childs, PharmD []  Greer Pickerel, PharmD []  Adalberto Cole, PharmD []  Perlie Gold, Rph []  Lonell Face) Jean Rosenthal, PharmD []  Earl Many, PharmD []  Junita Push, PharmD []  Dorna Leitz, PharmD []  Terrilee Files, PharmD []  Lynann Beaver, PharmD []  Keturah Barre, PharmD []  Loralee Pacas, PharmD []  Bernadene Person, PharmD   Positive blood culture with escherichia coli Treated with none, , patient currently admitted to [facility]. Nena Polio Garner Nash 08/24/2022, 9:04 AM

## 2022-08-24 NOTE — Progress Notes (Unsigned)
Office Visit    Patient Name: Dalton [name] Date of Encounter: 08/25/2022  Primary Care Provider:  Fleet Contras, MD Primary Cardiologist:  None Primary Electrophysiologist: None   Past Medical History    Past Medical History:  Diagnosis Date   (HFpEF) heart failure with preserved ejection fraction (HCC)    Anemia    Anxiety    Asthma    Bipolar disorder (HCC)    Chronic back pain    [facility] in [place]   Chronic bronchitis    COPD (chronic obstructive pulmonary disease) (HCC)    Coronary artery disease 2002   CABG (LIMA to LAD)   Depression    DVT (deep venous thrombosis) (HCC) ~ 2005   LLE   Frequency of urination    GERD (gastroesophageal reflux disease)    Headache(784.0)    History of seizures    Last 2011   HNP (herniated nucleus pulposus), cervical    Hyperlipidemia    Hypertension    MI (myocardial infarction) (HCC)    Neuropathy    NSAID-induced gastric ulcer    Rheumatoid arthritis (HCC)    Tonsillitis, chronic    Dr. [name] in [place]   Type II diabetes mellitus [facility])    Wears glasses    Past Surgical History:  Procedure Laterality Date   ANTERIOR CERVICAL DECOMP/DISCECTOMY FUSION N/A 11/05/2016   Procedure: Anterior Cervical Discectomy and Fusion - Cervical three-Cervical four;  Surgeon: Julio Sicks, MD;  Location: [facility];  Service: Neurosurgery;  Laterality: N/A;   BACK SURGERY     x3   BALLOON DILATION N/A 12/04/2019   Procedure: BALLOON DILATION;  Surgeon: Lanelle Bal, DO;  [facility];  Service: Endoscopy;  Laterality: N/A;   BIOPSY N/A 05/30/2012   Procedure: BIOPSY;  Surgeon: West Bali, MD;  Location: [facility];  Service: Endoscopy;  Laterality: N/A;   BIOPSY  03/02/2016   Procedure: BIOPSY;  Surgeon: West Bali, MD;  [facility];  Service: Endoscopy;;  gastric   BIOPSY  12/04/2019   Procedure: BIOPSY;  Surgeon: Lanelle Bal, DO;  [facility];  Service: Endoscopy;;    CARDIAC CATHETERIZATION  "several"   CARPAL TUNNEL RELEASE Bilateral    CATARACT EXTRACTION W/PHACO Right 06/15/2016   Procedure: CATARACT EXTRACTION PHACO AND INTRAOCULAR LENS PLACEMENT (IOC);  Surgeon: Jethro Bolus, MD;  Location: [facility];  Service: Ophthalmology;  Laterality: Right;  CDE: 4.64   CATARACT EXTRACTION W/PHACO Left 07/13/2016   Procedure: CATARACT EXTRACTION PHACO AND INTRAOCULAR LENS PLACEMENT (IOC);  Surgeon: Jethro Bolus, MD;  Location: [facility];  Service: Ophthalmology;  Laterality: Left;  CDE: 3.15   COLONOSCOPY  12/28/2010   SLF: (MAC)Internal hemorrhoids/four small colon polyps tubular adenomas. per SLF: colonoscopy 2022   COLONOSCOPY WITH PROPOFOL N/A 07/05/2017   Surgeon: West Bali, MD; tubular adenoma, hyperplastic polyp, submucosal nodule at hepatic flexure consistent with lipoma on biopsy, diverticulosis and rectosigmoid and sigmoid colon, rectal bleeding due to large internal hemorrhoids.  Recommended repeat in 3 years due to history of polyps and oily film on scope.   CORONARY ARTERY BYPASS GRAFT  2002   3 vessels   ESOPHAGOGASTRODUODENOSCOPY N/A 05/30/2012   SLF: UNCONTROLLED GERD DUE TO LIFESTYLE CHOICE/WEIGHT GAIN/MILD Non-erosive gastritis   ESOPHAGOGASTRODUODENOSCOPY (EGD) WITH PROPOFOL N/A 03/02/2016   Dr. [name]: Esophagus appeared normal, impaired dilation performed, patchy inflammation with edema and erythema of the entire stomach., Biopsy with H pylori, patient completed Pylera.  ESOPHAGOGASTRODUODENOSCOPY (EGD) WITH PROPOFOL N/A 12/04/2019   Surgeon: Lanelle Bal, DO; benign appearing esophageal stenosis dilated, gastritis biopsied (mild reactive gastropathy, gastritis, negative H. pylori), normal duodenum.   FRACTURE SURGERY     LEFT HEART CATHETERIZATION WITH CORONARY ANGIOGRAM N/A 08/31/2011   Procedure: LEFT HEART CATHETERIZATION WITH CORONARY ANGIOGRAM;  Surgeon: [name], MD;  Location: [facility] CATH LAB;  Service: Cardiovascular;   Laterality: N/A;   LUMBAR DISC SURGERY     "L4-5; Dr. Dutch Quint"   LUMBAR LAMINECTOMY/DECOMPRESSION MICRODISCECTOMY Right 03/06/2019   Procedure: MICRODISCECTOMY EXTRAFORAMINAL LUMBAR THREE - LUMBAR FOUR RIGHT;  Surgeon: Julio Sicks, MD;  Location: [facility] OR;  Service: Neurosurgery;  Laterality: Right;  MICRODISCECTOMY EXTRAFORAMINAL LUMBAR THREE - LUMBAR FOUR RIGHT   MULTIPLE TOOTH EXTRACTIONS     ORIF MANDIBULAR FRACTURE N/A 12/19/2015   Procedure: OPEN REDUCTION INTERNAL FIXATION (ORIF) MANDIBULAR FRACTURE;  Surgeon: [name], MD;  Location: [facility];  Service: ENT;  Laterality: N/A;   PATELLA FRACTURE SURGERY Left 1976   plate to knee cap from accident   POLYPECTOMY  07/05/2017   Procedure: POLYPECTOMY;  Surgeon: West Bali, MD;  [facility];  Service: Endoscopy;;  colon   SAVORY DILATION  12/28/2010   SLF:(MAC)J-shaped stomach/nodular mocosa in the distal esophagus/empiric dilation 16mm   SAVORY DILATION N/A 03/02/2016   Procedure: SAVORY DILATION;  Surgeon: West Bali, MD;  [facility];  Service: Endoscopy;  Laterality: N/A;    Allergies  Allergies  Allergen Reactions   Tirzepatide Shortness Of Breath    Other Reaction(s): Other  "BURNING IN THE CHEST"   Gabapentin Hives   Ibuprofen Other (See Comments)    Stomach  Upset and stomach ulcers   Zolpidem Tartrate     Other Reaction(s): Not available   Naproxen Other (See Comments)    HALLUCINATIONS     History of Present Illness    Dalton [name] is a 62 y.o. male with PMH of CAD s/p CABG x 1 in 2002 (LIMA to LAD), HFpEF, HTN, HLD, DM II, rheumatoid arthritis, DVT, bipolar disorder, peptic ulcer disease, left subclavian stent, past cocaine abuse who presents today for follow up.  Dalton [name] has a PMH of CABG x 1 in 2000 and subsequent LHC performed in 2013 that showed normal coronaries with a atretic LIMA and patent left subclavian stent. He completed a Lexiscan Myoview in 04/2021 that was  normal.  He was seen for preoperative clearance visit on 04/13/2022 and patient underwent repeat Lexiscan Myoview due to complaint of chest pain that showed no evidence of ischemia but did reveal decreased LV function.  He was sent for 2D echo that showed EF of 50-55% with no R WMA and grade 1 DD with trivial MVR.  He was seen in the ED on 05/18/2022 with complaint of palpitations and syncopal episode.  He was noted to have 2 witnessed syncopal events and EKG showed sinus tach with a rate of 105 with PACs and T WI along the inferior and lateral leads.  He was recommended to have follow-up ZIO monitor to assess SVT burden.  Imdur was discontinued at that time.  He presented to the ED 08/12/2022 with complaint of upper abdominal pain radiating to the chest.  Initial workup revealed pyelonephritis and patient was treated with cephalexin and discharged in stable condition.  He returned to the ED 08/21/2022 due to positive blood culture with negative rods and complaint of fever and chill with some nausea without emesis.  He had a fever of 103.1 but was hemodynamically stable.  He was discharged with amoxicillin 1 g 3 times daily x 6 days and continued on ceftriaxone for pyelonephritis.  He was in the process of being worked up by GI at [facility] due to dysphagia.  He was restarted on pantoprazole twice daily.  Patient's lisinopril was held due to soft BP in the setting.  Patient had EGD completed on 08/23/2022.  Dalton [name] presents today for posthospital follow-up.  Since last being seen in the office patient reports he has been feeling much better but still recovering from his recent bout with pyelonephritis.  His blood pressure today is controlled at 112/68 and heart rate is 65 bpm.  He has been tolerating his current medications without any adverse reactions.  He is still dealing with some nausea but is maintaining good fluid intake.  He does still feel fatigued and tires quickly.  He has been off of his Imdur for the last  few months and has not required any medications for chest pain.  He was also taken off of his lisinopril in the hospital and blood pressures have remained stable.  He was walking daily prior to his hospitalization and is planning to get back into this routine once he begins to feel better..  Patient denies chest pain, palpitations, dyspnea, PND, orthopnea, nausea, vomiting, dizziness, syncope, edema, weight gain, or early satiety.   Home Medications    Current Outpatient Medications  Medication Sig Dispense Refill   albuterol (PROVENTIL) (2.5 MG/3ML) 0.083% nebulizer solution Take 3 mLs (2.5 mg total) by nebulization every 6 (six) hours as needed for wheezing or shortness of breath. 75 mL 12   albuterol (VENTOLIN HFA) 108 (90 Base) MCG/ACT inhaler Inhale 2 puffs into the lungs every 4 (four) hours as needed for wheezing or shortness of breath. 1 each 3   ALPRAZolam (XANAX) 1 MG tablet Take 1 mg by mouth 4 (four) times daily.     amoxicillin (AMOXIL) 500 MG capsule Take 2 capsules (1,000 mg total) by mouth every 8 (eight) hours. 39 capsule 0   atorvastatin (LIPITOR) 80 MG tablet Take 80 mg by mouth at bedtime.     azelastine (ASTELIN) 0.1 % nasal spray Place 1 spray into both nostrils 2 (two) times daily.     beclomethasone (QVAR) 80 MCG/ACT inhaler Inhale 2 puffs into the lungs 2 (two) times daily as needed (respiratory issues.).      BELSOMRA 15 MG TABS Take 1 tablet by mouth at bedtime.     brimonidine (ALPHAGAN P) 0.1 % SOLN Place 1 drop into the right eye 2 (two) times daily.     cetirizine (ZYRTEC) 10 MG tablet Take 10 mg by mouth daily as needed for allergies.   0   clopidogrel (PLAVIX) 75 MG tablet Take 75 mg by mouth daily.     dexlansoprazole (DEXILANT) 60 MG capsule Take 1 capsule (60 mg total) by mouth daily. 30 capsule 5   esomeprazole (NEXIUM) 40 MG capsule Take 40 mg by mouth daily.     Evolocumab (REPATHA SURECLICK) 140 MG/ML SOAJ Take 140 mg by mouth every 14 (fourteen) days. 2 mL  11   ezetimibe (ZETIA) 10 MG tablet Take 10 mg by mouth daily.     fluticasone (FLONASE) 50 MCG/ACT nasal spray Place 2 sprays into both nostrils daily.     glipiZIDE (GLUCOTROL) 10 MG tablet Take 10 mg by mouth daily before breakfast.     isosorbide mononitrate (IMDUR) 60  MG 24 hr tablet Take 30-60 mg by mouth daily. Take half to a whole tablet as needed for chest pain     ketoconazole (NIZORAL) 2 % shampoo Apply 1 application topically daily as needed for irritation.      latanoprost (XALATAN) 0.005 % ophthalmic solution Place 1 drop into both eyes at bedtime.     LINZESS 290 MCG CAPS capsule TAKE 1 CAPSULE(290 MCG) BY MOUTH DAILY BEFORE BREAKFAST (Patient taking differently: Take 290 mcg by mouth daily before breakfast. May take 1 additional capsule as needed for constipation) 90 capsule 3   metFORMIN (GLUCOPHAGE-XR) 500 MG 24 hr tablet Take 500 mg by mouth daily with breakfast.      methocarbamol (ROBAXIN) 500 MG tablet Take by mouth as needed for muscle spasms.     metoprolol tartrate (LOPRESSOR) 50 MG tablet TAKE 1 TABLET(50 MG) BY MOUTH TWICE DAILY 180 tablet 3   MOVANTIK 25 MG TABS tablet Take 25 mg by mouth every morning.     Multiple Vitamin (MULTIVITAMIN WITH MINERALS) TABS tablet Take 1 tablet by mouth daily.     NARCAN 4 MG/0.1ML LIQD nasal spray kit Place 1 spray into the nose as needed (opioid overdose).   0   nitroGLYCERIN (NITROSTAT) 0.4 MG SL tablet Place 1 tablet (0.4 mg total) under the tongue every 5 (five) minutes as needed for chest pain.     omeprazole (PRILOSEC) 20 MG capsule Take 20 mg by mouth 2 (two) times daily as needed.     oxyCODONE-acetaminophen (PERCOCET) 10-325 MG tablet Take 1 tablet by mouth every 4 (four) hours as needed for pain.     potassium chloride SA (KLOR-CON) 20 MEQ tablet Take 20 mEq by mouth 2 (two) times daily.     sertraline (ZOLOFT) 100 MG tablet Take 100 mg by mouth daily.     sildenafil (VIAGRA) 100 MG tablet Take by mouth as needed for erectile  dysfunction.     tamsulosin (FLOMAX) 0.4 MG CAPS capsule Take 1 capsule (0.4 mg total) by mouth daily after breakfast. 30 capsule 0   Vitamin D, Ergocalciferol, (DRISDOL) 1.25 MG (50000 UT) CAPS capsule Take 50,000 Units by mouth every 7 (seven) days. Monday     furosemide (LASIX) 20 MG tablet Take 1 tablet (20 mg total) by mouth 2 (two) times daily. 60 tablet 6   No current facility-administered medications for this visit.     Review of Systems  Please see the history of present illness.    (+) Fatigue (+) Nausea  All other systems reviewed and are otherwise negative except as noted above.  Physical Exam    Wt Readings from Last 3 Encounters:  08/25/22 260 lb (117.9 kg)  08/23/22 228 lb 13.4 oz (103.8 kg)  08/20/22 230 lb (104.3 kg)   VS: Vitals:   08/25/22 1334  BP: 112/68  Pulse: 65  SpO2: 93%  ,Body mass index is 37.31 kg/m.  Constitutional:      Appearance: Healthy appearance. Not in distress.  Neck:     Vascular: JVD normal.  Pulmonary:     Effort: Pulmonary effort is normal.     Breath sounds: No wheezing. No rales. Diminished in the bases Cardiovascular:     Normal rate. Regular rhythm. Normal S1. Normal S2.      Murmurs: There is no murmur.  Edema:    Peripheral edema absent.  Abdominal:     Palpations: Abdomen is soft non tender. There is no hepatomegaly.  Skin:  General: Skin is warm and dry.  Neurological:     General: No focal deficit present.     Mental Status: Alert and oriented to person, place and time.     Cranial Nerves: Cranial nerves are intact.  EKG/LABS/ Recent Cardiac Studies    ECG personally reviewed by me today -none completed today  Cardiac Studies & Procedures     STRESS TESTS  MYOCARDIAL PERFUSION IMAGING 04/20/2022  Narrative   The study is normal. The study is low risk.   No ST deviation was noted.   LV perfusion is normal. There is no evidence of ischemia. There is no evidence of infarction.   Left ventricular function  is normal. Nuclear stress EF: 47 %. The left ventricular ejection fraction is mildly decreased (45-54%). End diastolic cavity size is normal. End systolic cavity size is normal.   Prior study available for comparison from 05/25/2021.   ECHOCARDIOGRAM  ECHOCARDIOGRAM COMPLETE 05/19/2022  Narrative ECHOCARDIOGRAM REPORT    Patient Name:   RAHN MILONE Date of Exam: 05/19/2022 Medical Rec #:  [id number]         Height:       70.0 in Accession #:    [id number]        Weight:       266.1 lb Date of Birth:  14-Jun-1960         BSA:          2.357 m Patient Age:    61 years          BP:           130/82 mmHg Patient Gender: M                 HR:           86 bpm. [facility]  Procedure: 2D Echo, Cardiac Doppler, Color Doppler and Intracardiac Opacification Agent  Indications:    Syncope R55  History:        Patient has prior history of Echocardiogram examinations, most recent 11/10/2021. CHF, CAD and Previous Myocardial Infarction, Signs/Symptoms:Chest Pain and Syncope; Risk Factors:Hypertension, Diabetes, Dyslipidemia and Non-Smoker.  Sonographer:    Aron Baba Referring Phys: [id number] ASIA B ZIERLE-GHOSH   Sonographer Comments: Image acquisition challenging due to patient body habitus, Image acquisition challenging due to COPD and Image acquisition challenging due to respiratory motion. IMPRESSIONS   1. Left ventricular ejection fraction, by estimation, is 50 to 55%. The left ventricle has low normal function. The left ventricle has no regional wall motion abnormalities. Left ventricular diastolic parameters are consistent with Grade I diastolic dysfunction (impaired relaxation). 2. Right ventricular systolic function is low normal. The right ventricular size is normal. There is normal pulmonary artery systolic pressure. The estimated right ventricular systolic pressure is 13.6 mmHg. 3. The mitral valve is grossly normal. Trivial mitral valve regurgitation. 4. The  aortic valve is tricuspid. Aortic valve regurgitation is not visualized. 5. The inferior vena cava is normal in size with greater than 50% respiratory variability, suggesting right atrial pressure of 3 mmHg.  Comparison(s): Prior images reviewed side by side. LVEF low normal range at 50-55%.  FINDINGS Left Ventricle: Left ventricular ejection fraction, by estimation, is 50 to 55%. The left ventricle has low normal function. The left ventricle has no regional wall motion abnormalities. Definity contrast agent was given IV to delineate the left ventricular endocardial borders. The left ventricular internal cavity size was normal in size. There is borderline left ventricular hypertrophy.  Left ventricular diastolic parameters are consistent with Grade I diastolic dysfunction (impaired relaxation).  Right Ventricle: The right ventricular size is normal. No increase in right ventricular wall thickness. Right ventricular systolic function is low normal. There is normal pulmonary artery systolic pressure. The tricuspid regurgitant velocity is 1.63 m/s, and with an assumed right atrial pressure of 3 mmHg, the estimated right ventricular systolic pressure is 13.6 mmHg.  Left Atrium: Left atrial size was normal in size.  Right Atrium: Right atrial size was normal in size.  Pericardium: There is no evidence of pericardial effusion.  Mitral Valve: The mitral valve is grossly normal. Trivial mitral valve regurgitation.  Tricuspid Valve: The tricuspid valve is grossly normal. Tricuspid valve regurgitation is trivial.  Aortic Valve: The aortic valve is tricuspid. There is mild aortic valve annular calcification. Aortic valve regurgitation is not visualized.  Pulmonic Valve: The pulmonic valve was grossly normal. Pulmonic valve regurgitation is trivial.  Aorta: The aortic root is normal in size and structure.  Venous: The inferior vena cava is normal in size with greater than 50% respiratory variability,  suggesting right atrial pressure of 3 mmHg.  IAS/Shunts: No atrial level shunt detected by color flow Doppler.   LEFT VENTRICLE PLAX 2D LVIDd:         4.50 cm   Diastology LVIDs:         3.30 cm   LV e' medial:    7.40 cm/s LV PW:         1.00 cm   LV E/e' medial:  9.2 LV IVS:        0.90 cm   LV e' lateral:   7.72 cm/s LVOT diam:     2.30 cm   LV E/e' lateral: 8.8 LV SV:         72 LV SV Index:   30 LVOT Area:     4.15 cm   RIGHT VENTRICLE RV S prime:     7.04 cm/s TAPSE (M-mode): 1.2 cm  LEFT ATRIUM             Index        RIGHT ATRIUM           Index LA diam:        4.20 cm 1.78 cm/m   RA Area:     15.60 cm LA Vol (A2C):   54.3 ml 23.04 ml/m  RA Volume:   35.30 ml  14.98 ml/m LA Vol (A4C):   52.4 ml 22.23 ml/m LA Biplane Vol: 54.2 ml 23.00 ml/m AORTIC VALVE             PULMONIC VALVE LVOT Vmax:   94.30 cm/s  PR End Diast Vel: 9.61 msec LVOT Vmean:  60.800 cm/s LVOT VTI:    0.173 m  AORTA Ao Root diam: 3.50 cm Ao Asc diam:  2.70 cm  MITRAL VALVE               TRICUSPID VALVE MV Area (PHT): 4.36 cm    TR Peak grad:   10.6 mmHg MV Decel Time: 174 msec    TR Vmax:        163.00 cm/s MV E velocity: 68.10 cm/s MV A velocity: 78.80 cm/s  SHUNTS MV E/A ratio:  0.86        Systemic VTI:  0.17 m Systemic Diam: 2.30 cm  Nona Dell MD Electronically signed by Nona Dell MD Signature Date/Time: 05/19/2022/11:06:49 AM    Final    MONITORS  LONG  TERM MONITOR (3-14 DAYS) 05/29/2021  Narrative  Normal sinus rhythm  Frequent, high burden (25%), symptomatic non-sustained SVT  Longest SVT run lasted 25 seconds at 111 bpm. Fastest SVT > 200 bpm.   Patch Wear Time:  6 days and 1 hours (2023-03-23T15:12:21-0400 to 2023-03-29T16:43:03-398)  Patient had a min HR of 53 bpm, max HR of 255 bpm, and avg HR of 81 bpm. Predominant underlying rhythm was Sinus Rhythm. Slight P wave morphology changes were noted. 1 run of Ventricular Tachycardia occurred lasting 4  beats with a max rate of 138 bpm (avg 111 bpm). 16109 Supraventricular Tachycardia runs occurred, the run with the fastest interval lasting 6 beats with a max rate of 255 bpm, the longest lasting 25.5 secs with an avg rate of 114 bpm. Supraventricular Tachycardia was detected within +/- 45 seconds of symptomatic patient event(s). Isolated SVEs were frequent (9.6%, [id number]), SVE Couplets were frequent (5.1%, 17485), and SVE Triplets were frequent (9.9%, [id number]). Isolated VEs were rare (<1.0%, 103), VE Couplets were rare (<1.0%, 40), and VE Triplets were rare (<1.0%, 3).   CT SCANS  CT CORONARY MORPH W/CTA COR W/SCORE 10/18/2019  Addendum 10/18/2019  5:24 PM ADDENDUM REPORT: 10/18/2019 17:21  CLINICAL DATA:  37M with hypertension, hyperlipidemia, diabetes, PAD, prior CABG with atretic LIMA, and prior cocaine abuse with chest pain.  EXAM: Cardiac/Coronary  CT  TECHNIQUE: The patient was scanned on a [employer].  FINDINGS: A 120 kV prospective scan was triggered in the descending thoracic aorta at 111 HU's. Axial non-contrast 3 mm slices were carried out through the heart. The data set was analyzed on a dedicated work station and scored using the Agatson method. Gantry rotation speed was 250 msecs and collimation was .6 mm. No beta blockade and 0.8 mg of sl NTG was given. The 3D data set was reconstructed in 5% intervals of the 67-82 % of the R-R cycle. Diastolic phases were analyzed on a dedicated work station using MPR, MIP and VRT modes. The patient received 80 cc of contrast.  Aorta: Normal size. Ascending aorta 2.7 cm. No calcifications. No dissection.  Aortic Valve:  Trileaflet.  No calcifications.  Coronary Arteries:  Normal coronary origin.  Right dominance.  RCA is a large dominant artery that gives rise to PDA and PLVB. There is no plaque.  Left main is a large artery that gives rise to LAD and LCX arteries.  LAD is a large vessel that has no plaque. Surgical  clips noted near the mid LAD. LIMA is atretic. There is a large D1 without plaque.  LCX is a non-dominant artery that gives rise to a small OM1, large, branching OM2, and a small OM3. There is no plaque.  Other findings:  Normal pulmonary vein drainage into the left atrium.  Normal let atrial appendage without a thrombus.  Normal size of the pulmonary artery.  IMPRESSION: 1. Coronary calcium score of 0. This was 0 percentile for age and sex matched control.  2. Normal coronary origin with right dominance.  3. No evidence of CAD.  Chilton Si, MD   Electronically Signed By: Chilton Si On: 10/18/2019 17:21  Narrative EXAM: OVER-READ INTERPRETATION  CT CHEST  The following report is an over-read performed by radiologist Dr. Trudie Reed of [facility] on 10/18/2019. This over-read does not include interpretation of cardiac or coronary anatomy or pathology. The coronary calcium score/coronary CTA interpretation by the cardiologist is attached.  COMPARISON:  Chest CT 07/26/2013.  FINDINGS: Trace left pleural  effusion lying dependently. Within the visualized portions of the thorax there are no suspicious appearing pulmonary nodules or masses, there is no acute consolidative airspace disease, no right pleural effusion, no pneumothorax and no lymphadenopathy. Visualized portions of the upper abdomen are unremarkable. There are no aggressive appearing lytic or blastic lesions noted in the visualized portions of the skeleton. Median sternotomy wires.  IMPRESSION: 1. Trace left pleural effusion lying dependently.  Electronically Signed: By: Trudie Reed M.D. On: 10/18/2019 09:10          STOP-Bang Score:  7        Lab Results  Component Value Date   WBC 3.2 (L) 08/23/2022   HGB 9.9 (L) 08/23/2022   HCT 29.2 (L) 08/23/2022   MCV 85.9 08/23/2022   PLT 163 08/23/2022   Lab Results  Component Value Date   CREATININE 0.85  08/24/2022   BUN 5 (L) 08/24/2022   NA 137 08/24/2022   K 3.3 (L) 08/24/2022   CL 103 08/24/2022   CO2 27 08/24/2022   Lab Results  Component Value Date   ALT 21 08/21/2022   AST 33 08/21/2022   ALKPHOS 77 08/21/2022   BILITOT 1.9 (H) 08/21/2022   Lab Results  Component Value Date   CHOL 70 05/20/2022   HDL 37 (L) 05/20/2022   LDLCALC 17 05/20/2022   TRIG 82 05/20/2022   CHOLHDL 1.9 05/20/2022    Lab Results  Component Value Date   HGBA1C 5.5 08/21/2022     Assessment & Plan    1.Coronary artery disease: -History of CABG x 1 in 2002 with Lexiscan Myoview completed 05/2021 with no evidence of ischemia -Patient reports today no episodes of chest pain or anginal equivalent. -Continue current GDMT with Lipitor 80 mg daily, Plavix 75 mg daily, Repatha 140 mg q. 14 days, Zetia 10 mg, Lopressor 50 mg twice daily, as needed Nitrostat.  2.  Chest pain: -Patient reports no residual chest pain and will change Imdur to 30 to 60 mg as needed  3.HFpEF: -Most recent 2D echo completed 10/2021 with EF of 60-65%, no RWMA, with trivial TVR and MVR  -Patient is euvolemic on examination today but weight has increased from hospital discharge by 25 pounds. -He was advised to continue Lasix 20 mg twice daily and potassium 20 mEq daily. -Low sodium diet, fluid restriction <2L, and daily weights encouraged. Educated to contact our office for weight gain of 2 lbs overnight or 5 lbs in one week.   4.Obstructive sleep apnea: -Patient reports compliance with his CPAP machine.  5.  Essential hypertension: -Patient's blood pressure is stable at 112/68 -He will continue to hold lisinopril at this time and continue metoprolol 50 mg twice daily  6.  History of PACs: -Patient has frequent PACs on palpitations and by auscultation. -We will have him wear 7-day ZIO monitor to quantify PAC burden's. -Continue metoprolol as noted above.  Disposition: Follow-up with None or APP in 3 months   Medication  Adjustments/Labs and Tests Ordered: Current medicines are reviewed at length with the patient today.  Concerns regarding medicines are outlined above.   Signed, Napoleon Form, Leodis Rains, NP 08/25/2022, 2:12 PM [facility]

## 2022-08-24 NOTE — Care Management Important Message (Signed)
Important Message  Patient Details  Name: Dalton [name] MRN: [id number] Date of Birth: 03-03-1960   Medicare Important Message Given:  Yes     Corey Harold 08/24/2022, 10:57 AM

## 2022-08-25 ENCOUNTER — Ambulatory Visit (INDEPENDENT_AMBULATORY_CARE_PROVIDER_SITE_OTHER): Payer: 59

## 2022-08-25 ENCOUNTER — Ambulatory Visit: Payer: 59 | Attending: Nurse Practitioner | Admitting: Nurse Practitioner

## 2022-08-25 ENCOUNTER — Encounter: Payer: Self-pay | Admitting: Nurse Practitioner

## 2022-08-25 ENCOUNTER — Other Ambulatory Visit: Payer: Self-pay | Admitting: Nurse Practitioner

## 2022-08-25 VITALS — BP 112/68 | HR 65 | Ht 70.0 in | Wt 260.0 lb

## 2022-08-25 DIAGNOSIS — R002 Palpitations: Secondary | ICD-10-CM

## 2022-08-25 DIAGNOSIS — I2510 Atherosclerotic heart disease of native coronary artery without angina pectoris: Secondary | ICD-10-CM

## 2022-08-25 DIAGNOSIS — Z7984 Long term (current) use of oral hypoglycemic drugs: Secondary | ICD-10-CM

## 2022-08-25 DIAGNOSIS — I5032 Chronic diastolic (congestive) heart failure: Secondary | ICD-10-CM

## 2022-08-25 DIAGNOSIS — E1159 Type 2 diabetes mellitus with other circulatory complications: Secondary | ICD-10-CM

## 2022-08-25 DIAGNOSIS — I491 Atrial premature depolarization: Secondary | ICD-10-CM

## 2022-08-25 DIAGNOSIS — G4733 Obstructive sleep apnea (adult) (pediatric): Secondary | ICD-10-CM

## 2022-08-25 DIAGNOSIS — I471 Supraventricular tachycardia, unspecified: Secondary | ICD-10-CM

## 2022-08-25 DIAGNOSIS — I10 Essential (primary) hypertension: Secondary | ICD-10-CM | POA: Diagnosis not present

## 2022-08-25 MED ORDER — FUROSEMIDE 20 MG PO TABS: 20.0000 mg | ORAL_TABLET | Freq: Two times a day (BID) | ORAL | 6 refills | Status: DC

## 2022-08-25 NOTE — Patient Instructions (Signed)
Medication Instructions:  CHANGE Imdur to 30-60mg  as needed for chest pain CHANGE Lasix to 20mg  Take 1 tablet twice a day  *If you need a refill on your cardiac medications before your next appointment, please call your pharmacy*   Lab Work: TODAY-BMET If you have labs (blood work) drawn today and your tests are completely normal, you will receive your results only by: MyChart Message (if you have MyChart) OR A paper copy in the mail If you have any lab test that is abnormal or we need to change your treatment, we will call you to review the results.   Testing/Procedures: Christena Deem- Long Term Monitor Instructions  Your physician has requested you wear a ZIO patch monitor for 14 days.  This is a single patch monitor. [employer] supplies one patch monitor per enrollment. Additional stickers are not available. Please do not apply patch if you will be having a Nuclear Stress Test,  Echocardiogram, Cardiac CT, MRI, or Chest Xray during the period you would be wearing the  monitor. The patch cannot be worn during these tests. You cannot remove and re-apply the  ZIO XT patch monitor.  Your ZIO patch monitor will be mailed 3 day USPS to your address on file. It may take 3-5 days  to receive your monitor after you have been enrolled.  Once you have received your monitor, please review the enclosed instructions. Your monitor  has already been registered assigning a specific monitor serial # to you.  Billing and Patient Assistance Program Information  We have supplied [employer] with any of your insurance information on file for billing purposes. [employer] offers a sliding scale Patient Assistance Program for patients that do not have  insurance, or whose insurance does not completely cover the cost of the ZIO monitor.  You must apply for the Patient Assistance Program to qualify for this discounted rate.  To apply, please call [employer] at [phone], select option 4, select option 2, ask to apply for   Patient Assistance Program. Meredeth Ide will ask your household income, and how many people  are in your household. They will quote your out-of-pocket cost based on that information.  [employer] will also be able to set up a 36-month, interest-free payment plan if needed.  Applying the monitor   Shave hair from upper left chest.  Hold abrader disc by orange tab. Rub abrader in 40 strokes over the upper left chest as  indicated in your monitor instructions.  Clean area with 4 enclosed alcohol pads. Let dry.  Apply patch as indicated in monitor instructions. Patch will be placed under collarbone on left  side of chest with arrow pointing upward.  Rub patch adhesive wings for 2 minutes. Remove white label marked "1". Remove the white  label marked "2". Rub patch adhesive wings for 2 additional minutes.  While looking in a mirror, press and release button in center of patch. A small green light will  flash 3-4 times. This will be your only indicator that the monitor has been turned on.  Do not shower for the first 24 hours. You may shower after the first 24 hours.  Press the button if you feel a symptom. You will hear a small click. Record Date, Time and  Symptom in the Patient Logbook.  When you are ready to remove the patch, follow instructions on the last 2 pages of Patient  Logbook. Stick patch monitor onto the last page of Patient Logbook.  Place Patient Logbook in the blue and white  box. Use locking tab on box and tape box closed  securely. The blue and white box has prepaid postage on it. Please place it in the mailbox as  soon as possible. Your physician should have your test results approximately 7 days after the  monitor has been mailed back to [facility].  Call [facility] Customer Care at [phone] if you have questions regarding  your ZIO XT patch monitor. Call them immediately if you see an orange light blinking on your  monitor.  If your monitor falls off in less than 4  days, contact our Monitor department at [phone].  If your monitor becomes loose or falls off after 4 days call [employer] at [phone] for  suggestions on securing your monitor   Follow-Up: At [facility], you and your health needs are our priority.  As part of our continuing mission to provide you with exceptional heart care, we have created designated Provider Care Teams.  These Care Teams include your primary Cardiologist (physician) and Advanced Practice Providers (APPs -  Physician Assistants and Nurse Practitioners) who all work together to provide you with the care you need, when you need it.  We recommend signing up for the patient portal called "MyChart".  Sign up information is provided on this After Visit Summary.  MyChart is used to connect with patients for Virtual Visits (Telemedicine).  Patients are able to view lab/test results, encounter notes, upcoming appointments, etc.  Non-urgent messages can be sent to your provider as well.   To learn more about what you can do with MyChart, go to [email or web address].    Your next appointment:   3 month(s)  Provider:   Robin Searing, NP       Other Instructions

## 2022-08-25 NOTE — Progress Notes (Unsigned)
ZIO XT serial # [id number] from office inventory applied to patient.  Dr. Mayford Knife to read.

## 2022-08-26 LAB — BASIC METABOLIC PANEL
BUN/Creatinine Ratio: 4 — ABNORMAL LOW (ref 10–24)
BUN: 3 mg/dL — ABNORMAL LOW (ref 8–27)
CO2: 23 mmol/L (ref 20–29)
Calcium: 8.7 mg/dL (ref 8.6–10.2)
Chloride: 106 mmol/L (ref 96–106)
Creatinine, Ser: 0.76 mg/dL (ref 0.76–1.27)
Glucose: 88 mg/dL (ref 70–99)
Potassium: 3.7 mmol/L (ref 3.5–5.2)
Sodium: 143 mmol/L (ref 134–144)
eGFR: 102 mL/min/{1.73_m2} (ref >59)

## 2022-08-26 LAB — CULTURE, BLOOD (ROUTINE X 2): Special Requests: ADEQUATE

## 2022-08-26 NOTE — Anesthesia Postprocedure Evaluation (Signed)
Anesthesia Post Note  Patient: TYLER BLECH  Procedure(s) Performed: ESOPHAGOGASTRODUODENOSCOPY (EGD) WITH PROPOFOL MALONEY DILATION BIOPSY  Patient location during evaluation: Phase II Anesthesia Type: General Level of consciousness: awake Pain management: pain level controlled Vital Signs Assessment: post-procedure vital signs reviewed and stable Respiratory status: spontaneous breathing and respiratory function stable Cardiovascular status: blood pressure returned to baseline and stable Postop Assessment: no headache and no apparent nausea or vomiting Anesthetic complications: no Comments: Late entry   No notable events documented.   Last Vitals:  Vitals:   08/24/22 0413 08/24/22 0844  BP: 119/72 113/75  Pulse: (!) 49 63  Resp: 20 19  Temp: (!) 36.4 C 36.4 C  SpO2: 100% 100%    Last Pain:  Vitals:   08/24/22 0844  TempSrc: Oral  PainSc: 0-No pain                 Windell Norfolk

## 2022-08-27 ENCOUNTER — Telehealth: Payer: Self-pay | Admitting: Cardiology

## 2022-08-27 ENCOUNTER — Encounter (HOSPITAL_COMMUNITY): Payer: Self-pay | Admitting: Internal Medicine

## 2022-08-27 LAB — SURGICAL PATHOLOGY

## 2022-08-27 NOTE — Telephone Encounter (Signed)
Called and spoke with patient, he states he has already spoken with CMA and discussed lab results. Patient denies any questions at this time and expressed appreciation for follow-up.

## 2022-08-27 NOTE — Telephone Encounter (Signed)
Patient returned CMA's call. 

## 2022-08-29 ENCOUNTER — Encounter: Payer: Self-pay | Admitting: Internal Medicine

## 2022-09-20 ENCOUNTER — Telehealth: Payer: Self-pay

## 2022-09-20 DIAGNOSIS — I4719 Other supraventricular tachycardia: Secondary | ICD-10-CM

## 2022-09-20 MED ORDER — METOPROLOL TARTRATE 50 MG PO TABS: ORAL_TABLET | ORAL | 3 refills | Status: DC

## 2022-09-20 NOTE — Telephone Encounter (Signed)
-----   Message from [facility] Tierica T sent at 09/20/2022 12:59 PM EDT ----- Idk if this will work but let me know ----- Message ----- From: Gaston Islam., NP Sent: 09/16/2022   8:59 AM EDT To: Alveta Heimlich, CMA  Please let patient know that his event monitor showed predominantly sinus rhythm but did indicate frequent extra beats in the top portion of the heart called atrial tachycardia.  There was no evidence of atrial fibrillation or heart block.  Plan: -Please refer patient to EP for further guidance and recommendations -Please advise patient to take additional 50 mg of metoprolol for sustained palpitations -Please let us know if you have any further questions.  Robin Searing, NP

## 2022-09-20 NOTE — Telephone Encounter (Signed)
Patient returned my call and I reviewed results with patient who verbalized understanding. Patient thanked me for the call. I will update his medication list per Robin Searing, NP  take additional 50 mg of metoprolol for sustained palpitations and sent referral over to EP.

## 2022-09-27 ENCOUNTER — Other Ambulatory Visit: Payer: Self-pay

## 2022-09-27 ENCOUNTER — Encounter (HOSPITAL_COMMUNITY): Payer: Self-pay | Admitting: *Deleted

## 2022-09-27 ENCOUNTER — Emergency Department (HOSPITAL_COMMUNITY): Payer: 59

## 2022-09-27 ENCOUNTER — Emergency Department (HOSPITAL_COMMUNITY)
Admission: EM | Admit: 2022-09-27 | Discharge: 2022-09-27 | Disposition: A | Discharge status: Home / Self Care | Payer: 59 | Attending: Emergency Medicine | Admitting: Emergency Medicine

## 2022-09-27 DIAGNOSIS — Z7902 Long term (current) use of antithrombotics/antiplatelets: Secondary | ICD-10-CM | POA: Insufficient documentation

## 2022-09-27 DIAGNOSIS — Z1152 Encounter for screening for COVID-19: Secondary | ICD-10-CM | POA: Diagnosis not present

## 2022-09-27 DIAGNOSIS — I10 Essential (primary) hypertension: Secondary | ICD-10-CM | POA: Diagnosis not present

## 2022-09-27 DIAGNOSIS — Z7984 Long term (current) use of oral hypoglycemic drugs: Secondary | ICD-10-CM | POA: Insufficient documentation

## 2022-09-27 DIAGNOSIS — Z79899 Other long term (current) drug therapy: Secondary | ICD-10-CM | POA: Diagnosis not present

## 2022-09-27 DIAGNOSIS — E119 Type 2 diabetes mellitus without complications: Secondary | ICD-10-CM | POA: Insufficient documentation

## 2022-09-27 DIAGNOSIS — J449 Chronic obstructive pulmonary disease, unspecified: Secondary | ICD-10-CM | POA: Insufficient documentation

## 2022-09-27 DIAGNOSIS — Z7951 Long term (current) use of inhaled steroids: Secondary | ICD-10-CM | POA: Insufficient documentation

## 2022-09-27 DIAGNOSIS — R059 Cough, unspecified: Secondary | ICD-10-CM | POA: Diagnosis present

## 2022-09-27 DIAGNOSIS — J069 Acute upper respiratory infection, unspecified: Secondary | ICD-10-CM | POA: Diagnosis not present

## 2022-09-27 LAB — SARS CORONAVIRUS 2 BY RT PCR: SARS Coronavirus 2 by RT PCR: NEGATIVE

## 2022-09-27 LAB — GROUP A STREP BY PCR: Group A Strep by PCR: NOT DETECTED

## 2022-09-27 MED ORDER — ACETAMINOPHEN 325 MG PO TABS
650.0000 mg | ORAL_TABLET | Freq: Once | ORAL | Status: AC
  Administered 2022-09-27: 650 mg via ORAL
  Filled 2022-09-27: qty 2

## 2022-09-27 NOTE — Discharge Instructions (Signed)
Evaluation today for your symptoms is overall reassuring.  I suspect it is a viral upper respiratory infection.  Recommend conservative treatment at home.  This includes rest, assertive hydration and you can take Tylenol and ibuprofen as needed for fever and symptomatic relief.  If you develop a worsening fever, shortness of breath, chest pain or any other concerning symptom please return emergency department for further evaluation.  Otherwise recommend follow-up with your PCP.

## 2022-09-27 NOTE — ED Triage Notes (Signed)
Pt c/o cough for the last couple of days; pt is coughing up green/white sputum

## 2022-09-27 NOTE — ED Provider Notes (Signed)
[place] EMERGENCY DEPARTMENT AT [facility] Provider Note   CSN: [id number] Arrival date & time: 09/27/22  1734     History  Chief Complaint  Patient presents with   Cough   HPI Dalton [name] is a 62 y.o. male with hypertension, COPD, diabetes presented for cough.  Started 2 days ago.  Cough is productive with green-white sputum.  Denies chest pain shortness of breath.  Denies fever.  States it started in [place] when he was visiting family.  Also endorsing a sore throat.   Cough      Home Medications Prior to Admission medications   Medication Sig Start Date End Date Taking? Authorizing Provider  albuterol (PROVENTIL) (2.5 MG/3ML) 0.083% nebulizer solution Take 3 mLs (2.5 mg total) by nebulization every 6 (six) hours as needed for wheezing or shortness of breath. 07/08/22   Eber Hong, MD  albuterol (VENTOLIN HFA) 108 (90 Base) MCG/ACT inhaler Inhale 2 puffs into the lungs every 4 (four) hours as needed for wheezing or shortness of breath. 07/08/22   Eber Hong, MD  ALPRAZolam Prudy Feeler) 1 MG tablet Take 1 mg by mouth 4 (four) times daily. 08/01/19   [provider]  amoxicillin (AMOXIL) 500 MG capsule Take 2 capsules (1,000 mg total) by mouth every 8 (eight) hours. 08/24/22   Catarina Hartshorn, MD  atorvastatin (LIPITOR) 80 MG tablet Take 80 mg by mouth at bedtime. 12/05/20   [provider]  azelastine (ASTELIN) 0.1 % nasal spray Place 1 spray into both nostrils 2 (two) times daily. 12/29/21   [provider]  beclomethasone (QVAR) 80 MCG/ACT inhaler Inhale 2 puffs into the lungs 2 (two) times daily as needed (respiratory issues.).     [provider]  BELSOMRA 15 MG TABS Take 1 tablet by mouth at bedtime. 04/30/21   [provider]  brimonidine (ALPHAGAN P) 0.1 % SOLN Place 1 drop into the right eye 2 (two) times daily. 12/25/20   [provider]  cetirizine (ZYRTEC) 10 MG tablet Take 10 mg by mouth daily as needed for  allergies.  01/26/16   [provider]  clopidogrel (PLAVIX) 75 MG tablet Take 75 mg by mouth daily.    [provider]  dexlansoprazole (DEXILANT) 60 MG capsule Take 1 capsule (60 mg total) by mouth daily. 07/05/17   Fields, Darleene Cleaver, MD  esomeprazole (NEXIUM) 40 MG capsule Take 40 mg by mouth daily. 07/06/22   [provider]  Evolocumab (REPATHA SURECLICK) 140 MG/ML SOAJ Take 140 mg by mouth every 14 (fourteen) days. 03/10/22   Antoine Poche, MD  ezetimibe (ZETIA) 10 MG tablet Take 10 mg by mouth daily.    [provider]  fluticasone (FLONASE) 50 MCG/ACT nasal spray Place 2 sprays into both nostrils daily.    [provider]  furosemide (LASIX) 20 MG tablet Take 1 tablet (20 mg total) by mouth 2 (two) times daily. 08/25/22   Gaston Islam., NP  glipiZIDE (GLUCOTROL) 10 MG tablet Take 10 mg by mouth daily before breakfast.    [provider]  isosorbide mononitrate (IMDUR) 60 MG 24 hr tablet Take 30-60 mg by mouth daily. Take half to a whole tablet as needed for chest pain    [provider]  ketoconazole (NIZORAL) 2 % shampoo Apply 1 application topically daily as needed for irritation.  10/23/19   [provider]  latanoprost (XALATAN) 0.005 % ophthalmic solution Place 1 drop into both eyes at bedtime.  [provider]  LINZESS 290 MCG CAPS capsule TAKE 1 CAPSULE(290 MCG) BY MOUTH DAILY BEFORE BREAKFAST Patient taking differently: Take 290 mcg by mouth daily before breakfast. May take 1 additional capsule as needed for constipation 02/04/21   Tiffany Kocher, PA-C  metFORMIN (GLUCOPHAGE-XR) 500 MG 24 hr tablet Take 500 mg by mouth daily with breakfast.     [provider]  methocarbamol (ROBAXIN) 500 MG tablet Take by mouth as needed for muscle spasms.    [provider]  metoprolol tartrate (LOPRESSOR) 50 MG tablet TAKE 1 TABLET(50 MG) BY MOUTH TWICE DAILY. YOU MAY TAKE A EXTRA 50 MG AS NEEDED  FOR PALPITATIONS. 09/20/22   Gaston Islam., NP  MOVANTIK 25 MG TABS tablet Take 25 mg by mouth every morning. 07/09/22   [provider]  Multiple Vitamin (MULTIVITAMIN WITH MINERALS) TABS tablet Take 1 tablet by mouth daily.    [provider]  NARCAN 4 MG/0.1ML LIQD nasal spray kit Place 1 spray into the nose as needed (opioid overdose).  06/15/17   [provider]  nitroGLYCERIN (NITROSTAT) 0.4 MG SL tablet Place 1 tablet (0.4 mg total) under the tongue every 5 (five) minutes as needed for chest pain. 08/26/21   Johnson, Clanford L, MD  omeprazole (PRILOSEC) 20 MG capsule Take 20 mg by mouth 2 (two) times daily as needed. 07/21/22   [provider]  oxyCODONE-acetaminophen (PERCOCET) 10-325 MG tablet Take 1 tablet by mouth every 4 (four) hours as needed for pain.    [provider]  potassium chloride SA (KLOR-CON) 20 MEQ tablet Take 20 mEq by mouth 2 (two) times daily.    [provider]  sertraline (ZOLOFT) 100 MG tablet Take 100 mg by mouth daily.    [provider]  sildenafil (VIAGRA) 100 MG tablet Take by mouth as needed for erectile dysfunction. 03/11/22   [provider]  tamsulosin (FLOMAX) 0.4 MG CAPS capsule Take 1 capsule (0.4 mg total) by mouth daily after breakfast. 01/07/16   Adonis Brook, NP  Vitamin D, Ergocalciferol, (DRISDOL) 1.25 MG (50000 UT) CAPS capsule Take 50,000 Units by mouth every 7 (seven) days. Monday    [provider]      Allergies    Tirzepatide, Gabapentin, Ibuprofen, Zolpidem tartrate, and Naproxen    Review of Systems   Review of Systems  Respiratory:  Positive for cough.     Physical Exam   Vitals:   09/27/22 1945 09/27/22 2243  BP: 137/83   Pulse: 63   Resp:    Temp:  98.2 F (36.8 C)  SpO2: 98%     CONSTITUTIONAL:  well-appearing, NAD NEURO:  Alert and oriented x 3, CN 3-12 grossly intact EYES:  eyes equal and reactive ENT/NECK:  Supple, no stridor  CARDIO:   Regular rate and rhythm, appears well-perfused  PULM:  No respiratory distress, CTAB GI/GU:  non-distended, soft MSK/SPINE:  No gross deformities, no edema, moves all extremities  SKIN:  no rash, atraumatic  *Additional and/or pertinent findings included in MDM below   ED Results / Procedures / Treatments   Labs (all labs ordered are listed, but only abnormal results are displayed) Labs Reviewed  SARS CORONAVIRUS 2 BY RT PCR  GROUP A STREP BY PCR    EKG None  Radiology DG Chest 2 View  Result Date: 09/27/2022 CLINICAL DATA:  Cough EXAM: CHEST - 2 VIEW COMPARISON:  Chest x-ray 08/20/2022 FINDINGS: The heart size and mediastinal contours are within  normal limits. Both lungs sternotomy wires are again seen. Are clear. The visualized skeletal structures are unremarkable. IMPRESSION: No active cardiopulmonary disease. Electronically Signed   By: Darliss Cheney M.D.   On: 09/27/2022 22:20    Procedures Procedures    Medications Ordered in ED Medications  acetaminophen (TYLENOL) tablet 650 mg (has no administration in time range)    ED Course/ Medical Decision Making/ A&P                                 Medical Decision Making Amount and/or Complexity of Data Reviewed Radiology: ordered.   62 year old well-appearing male presenting for cough and sore throat.  Exam was unremarkable.  Strep PCR and COVID test were both negative.  X-ray without acute findings.  Vital stable.  Doubt pneumonia.  Suspect viral illness.  Advised treat conservatively at home.  Discussed pertinent return precautions.  Advised to follow-up with PCP.  Discharged home in good condition.        Final Clinical Impression(s) / ED Diagnoses Final diagnoses:  Viral URI with cough    Rx / DC Orders ED Discharge Orders     None         Gareth Eagle, PA-C 09/27/22 2301    Derwood Kaplan, MD 09/28/22 1512

## 2022-11-03 NOTE — Progress Notes (Unsigned)
Electrophysiology Office Note:   Date:  11/04/2022  ID:  Dalton [name], DOB [birth year], MRN [id number]  Primary Cardiologist: None Electrophysiologist: Nobie Putnam, MD      History of Present Illness:   Dalton [name] is a 62 y.o. 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   Patient wore Zio monitor for approximately 3 days and average between 21 to 27% SVE burden.  There was no significant bradycardia. Feels some fluttering/racing in chest then "heart burn" sensation which resolves with some Nexium and drinking cold water. Sometimes occurring as often as once daily. Occasional dizziness and lightheadedness but no syncope.  Otherwise doing relatively well.  He takes all his current medications without issue.  Review of systems complete and found to be negative unless listed in HPI.   EP Information / Studies Reviewed:    EKG is not ordered today. EKG from 08/20/2022 reviewed which showed   sinus tachycardia.     Echocardiogram 05/19/2022: LVEF 50 to 55% with no regional wall motion abnormalities.   Nuclear stress test 04/19/2022: Normal study. No evidence of ischemia or infarction.  Coronary CTA 10/18/2019: Coronary calcium score of 0.  Normal coronary origins with no evidence of coronary artery disease.    Physical Exam:   VS:  BP (!) 142/72   Pulse 62   Ht 5\' 10"  (1.778 m)   Wt 259 lb 3.2 oz (117.6 kg)   SpO2 99%   BMI 37.19 kg/m    Wt Readings from Last 3 Encounters:  11/04/22 259 lb 3.2 oz (117.6 kg)  09/27/22 258 lb (117 kg)  08/25/22 260 lb (117.9 kg)     GEN: Well nourished, well developed in no acute distress NECK: No JVD; No carotid bruits CARDIAC: Regular  rate and rhythm, no murmurs, rubs, gallops RESPIRATORY:  Clear to auscultation without rales, wheezing or rhonchi  ABDOMEN: Soft, non-tender, non-distended EXTREMITIES:  No edema; No deformity   ASSESSMENT AND PLAN:   1.Frequent symptomatic PACs and short bursts of nonsustained atrial tachycardia:  -He is currently taking metoprolol tartrate 50 twice daily.  Ideally metoprolol tartrate should be q8h medication otherwise you run the risk of reflex tachycardia.  In efforts to provide him longer lasting beta-blockade and to keep his regimen similar, I suggest changing to metoprolol XL 50mg  twice daily.  We will then repeat a ZIO monitor to assess for improvement.  He only wore his last monitor for 3 days and I instructed him to wear for a longer period of time to see if he has sustained SVT. If he continues to have the symptoms we can increase first increase his morning dose of metoprolol XL to 100mg  and reassess.  Lastly, given that he does not have coronary artery disease by cardiac catheterization and coronary CTA and had a recent normal stress test without ischemia or infarct in the setting of normal LVEF on echo, flecainide or sotalol could be considered if he does not find improvement with increasing beta-blockade as stated above.  He would like to avoid procedural options if possible, but if he fails medical therapy EP study plus minus ablation would not be unreasonable.  2.  Occasional PVCs -Will try to manage with metoprolol as  stated above.  3.  Hypertension: Blood pressure slightly elevated today in clinic but reports usually normal at home.  He is taking his current medications without issue. -Continue current regimen, encourage close monitoring/recording of blood pressure and follow-up with his primary care physician.   Follow up with EP APP  8 weeks.   Signed, Nobie Putnam, MD

## 2022-11-04 ENCOUNTER — Encounter: Payer: Self-pay | Admitting: Cardiology

## 2022-11-04 ENCOUNTER — Ambulatory Visit: Payer: 59 | Attending: Cardiology

## 2022-11-04 ENCOUNTER — Ambulatory Visit: Payer: 59 | Attending: Cardiovascular Disease | Admitting: Cardiology

## 2022-11-04 VITALS — BP 142/72 | HR 62 | Ht 70.0 in | Wt 259.2 lb

## 2022-11-04 DIAGNOSIS — I493 Ventricular premature depolarization: Secondary | ICD-10-CM

## 2022-11-04 DIAGNOSIS — I4719 Other supraventricular tachycardia: Secondary | ICD-10-CM

## 2022-11-04 DIAGNOSIS — I471 Supraventricular tachycardia, unspecified: Secondary | ICD-10-CM | POA: Diagnosis not present

## 2022-11-04 DIAGNOSIS — I10 Essential (primary) hypertension: Secondary | ICD-10-CM

## 2022-11-04 MED ORDER — METOPROLOL SUCCINATE ER 50 MG PO TB24: 50.0000 mg | ORAL_TABLET | Freq: Every day | ORAL | 3 refills | Status: DC

## 2022-11-04 NOTE — Progress Notes (Unsigned)
Enrolled for [employer] to mail a ZIO XT long term holter monitor to the patients address on file.   Dr. Nobie Putnam to read

## 2022-11-04 NOTE — Addendum Note (Signed)
Addended by: Nobie Putnam D on: 11/04/2022 04:45 PM   Modules accepted: Level of Service

## 2022-11-04 NOTE — Patient Instructions (Addendum)
Medication Instructions:  Your physician has recommended you make the following change in your medication:  1) STOP taking metoprolol tartrate (Lopressor) 2) START taking metoprolol succinate (Toprol XL) 50 mg daily *If you need a refill on your cardiac medications before your next appointment, please call your pharmacy*  Testing/Procedures: Your physician has recommended that you wear an event monitor. Event monitors are medical devices that record the heart's electrical activity. Doctors most often [place] these monitors to diagnose arrhythmias. Arrhythmias are problems with the speed or rhythm of the heartbeat. The monitor is a small, portable device. You can wear one while you do your normal daily activities. This is usually used to diagnose what is causing palpitations/syncope (passing out).  Follow-Up: At [facility], you and your health needs are our priority.  As part of our continuing mission to provide you with exceptional heart care, we have created designated Provider Care Teams.  These Care Teams include your primary Cardiologist (physician) and Advanced Practice Providers (APPs -  Physician Assistants and Nurse Practitioners) who all work together to provide you with the care you need, when you need it.  Your next appointment:   2 months  Provider:   You will see one of the following Advanced Practice Providers on your designated Care Team:   Dalton [name], Dalton [name], [place] Dalton Don, NP Dalton Brim, NP  Other Instructions Dalton [name]- Long Term Monitor Instructions  Your physician has requested you wear a ZIO patch monitor for 10 - 14 days. You can apply the monitor 1 week after changing your metoprolol.  This is a single patch monitor. [employer] supplies one patch monitor per enrollment. Additional stickers are not available. Please do not apply patch if you will be having a Nuclear Stress Test,  Echocardiogram, Cardiac CT, MRI, or Chest Xray during the  period you would be wearing the  monitor. The patch cannot be worn during these tests. You cannot remove and re-apply the  ZIO XT patch monitor.  Your ZIO patch monitor will be mailed 3 day USPS to your address on file. It may take 3-5 days  to receive your monitor after you have been enrolled.  Once you have received your monitor, please review the enclosed instructions. Your monitor  has already been registered assigning a specific monitor serial # to you.  Billing and Patient Assistance Program Information  We have supplied [employer] with any of your insurance information on file for billing purposes. [employer] offers a sliding scale Patient Assistance Program for patients that do not have  insurance, or whose insurance does not completely cover the cost of the ZIO monitor.  You must apply for the Patient Assistance Program to qualify for this discounted rate.  To apply, please call [employer] at [phone], select option 4, select option 2, ask to apply for  Patient Assistance Program. Dalton [name] will ask your household income, and how many people  are in your household. They will quote your out-of-pocket cost based on that information.  [employer] will also be able to set up a 73-month, interest-free payment plan if needed.  Applying the monitor   Shave hair from upper left chest.  Hold abrader disc by orange tab. Rub abrader in 40 strokes over the upper left chest as  indicated in your monitor instructions.  Clean area with 4 enclosed alcohol pads. Let dry.  Apply patch as indicated in monitor instructions. Patch will be placed under collarbone on left  side of chest with arrow pointing  upward.  Rub patch adhesive wings for 2 minutes. Remove white label marked "1". Remove the white  label marked "2". Rub patch adhesive wings for 2 additional minutes.  While looking in a mirror, press and release button in center of patch. A small green light will  flash 3-4 times. This will be your only  indicator that the monitor has been turned on.  Do not shower for the first 24 hours. You may shower after the first 24 hours.  Press the button if you feel a symptom. You will hear a small click. Record Date, Time and  Symptom in the Patient Logbook.  When you are ready to remove the patch, follow instructions on the last 2 pages of Patient  Logbook. Stick patch monitor onto the last page of Patient Logbook.  Place Patient Logbook in the blue and white box. Use locking tab on box and tape box closed  securely. The blue and white box has prepaid postage on it. Please place it in the mailbox as  soon as possible. Your physician should have your test results approximately 7 days after the  monitor has been mailed back to [facility].  Call [facility] Customer Care at [phone] if you have questions regarding  your ZIO XT patch monitor. Call them immediately if you see an orange light blinking on your  monitor.  If your monitor falls off in less than 4 days, contact our Monitor department at [phone].  If your monitor becomes loose or falls off after 4 days call [employer] at [phone] for  suggestions on securing your monitor

## 2022-11-05 ENCOUNTER — Institutional Professional Consult (permissible substitution): Payer: 59 | Admitting: Cardiovascular Disease

## 2022-11-24 NOTE — H&P (View-Only) (Signed)
Cardiology Office Note    Patient Name: Dalton [name] Date of Encounter: 11/24/2022  Primary Care Provider:  Fleet Contras, MD Primary Cardiologist:  None Primary Electrophysiologist: Nobie Putnam, MD   Past Medical History    Past Medical History:  Diagnosis Date   (HFpEF) heart failure with preserved ejection fraction (HCC)    Anemia    Anxiety    Asthma    Bipolar disorder (HCC)    Chronic back pain    Pain Clinic in [place]   Chronic bronchitis    COPD (chronic obstructive pulmonary disease) (HCC)    Coronary artery disease 2002   CABG (LIMA to LAD)   Depression    DVT (deep venous thrombosis) (HCC) ~ 2005   LLE   Frequency of urination    GERD (gastroesophageal reflux disease)    Headache(784.0)    History of seizures    Last 2011   HNP (herniated nucleus pulposus), cervical    Hyperlipidemia    Hypertension    MI (myocardial infarction) (HCC)    Neuropathy    NSAID-induced gastric ulcer    Rheumatoid arthritis (HCC)    Tonsillitis, chronic    Dr. [name] in [place]   Type II diabetes mellitus [facility])    Wears glasses     History of Present Illness  Dalton [name] is a 62 y.o. male with PMH of CAD s/p CABG x 1 in 2002 (LIMA to LAD), HFpEF, HTN, HLD, DM II, rheumatoid arthritis, DVT, bipolar disorder, peptic ulcer disease, left subclavian stent, past cocaine abuse who presents today for atrial tachycardia.  Dalton [name] was seen last on 08/25/2022 following a hospitalization for pyelonephritis.  His blood pressures were controlled during visit and patient reported feeling tired and fatigued easily.  He was taken off of lisinopril and blood pressures remained stable.  He continued to have frequent PACs and 7-day ZIO monitor was worn that showed 27% burden of SVT with possible aberrancy and patient was referred to EP for further evaluation.  He was advised to take an additional 50 mg of metoprolol for sustained palpitations.  He was referred to EP and  seen by Dr. Jimmey Ralph in metoprolol was switched to Toprol-XL 50 mg and event monitor was repeated with instruction to wear longer for better evaluation of tachycardia.  He indicated his wish to avoid procedural options at this time and will continue to pursue medical therapy with the possibility of adding antiarrhythmic medications.   During today's visit the patient reports that he has been experiencing episodes of fatigue and chest pressure along with shortness of breath and sweating with activities.  He reports discomfort with going up hills and gets very fatigued walking 4 blocks.  He has required as needed nitroglycerin 3 times since his previous follow-up.  He is currently on 2 nitrates continues to have breakthrough discomfort.  I discussed his case with Dr. Lalla Brothers who agreed that left heart cath would be the best course of action moving forward.  He has been compliant with his medications and denies any reactions.  He is scheduled to undergo a colonoscopy and EGD and was informed that this will have to be rescheduled pending his left heart catheterization.  Patient denies palpitations, dyspnea, PND, orthopnea, nausea, vomiting, dizziness, syncope, edema, weight gain, or early satiety.  Review of Systems  Please see the history of present illness.    All other systems reviewed and are otherwise negative except as noted above.  Physical Exam  Wt Readings from Last 3 Encounters:  11/04/22 259 lb 3.2 oz (117.6 kg)  09/27/22 258 lb (117 kg)  08/25/22 260 lb (117.9 kg)   FA:OZHYQ were no vitals filed for this visit.,There is no height or weight on file to calculate BMI. GEN: Well nourished, well developed in no acute distress Neck: No JVD; No carotid bruits Pulmonary: Clear to auscultation without rales, wheezing or rhonchi  Cardiovascular: Normal rate. Regular rhythm. Normal S1. Normal S2.   Murmurs: There is no murmur.  ABDOMEN: Soft, non-tender, non-distended EXTREMITIES:  No edema; No  deformity   EKG/LABS/ Recent Cardiac Studies   ECG personally reviewed by me today -sinus rhythm with PACs and rate of 65 bpm with no acute changes consistent with previous EKG.  Risk Assessment/Calculations:      STOP-Bang Score:         Lab Results  Component Value Date   WBC 3.2 (L) 08/23/2022   HGB 9.9 (L) 08/23/2022   HCT 29.2 (L) 08/23/2022   MCV 85.9 08/23/2022   PLT 163 08/23/2022   Lab Results  Component Value Date   CREATININE 0.76 08/25/2022   BUN 3 (L) 08/25/2022   NA 143 08/25/2022   K 3.7 08/25/2022   CL 106 08/25/2022   CO2 23 08/25/2022   Lab Results  Component Value Date   CHOL 70 05/20/2022   HDL 37 (L) 05/20/2022   LDLCALC 17 05/20/2022   TRIG 82 05/20/2022   CHOLHDL 1.9 05/20/2022    Lab Results  Component Value Date   HGBA1C 5.5 08/21/2022   Assessment & Plan    1. Chest Pain/stable angina: -Patient reports ongoing chest pressure with shortness of breath and fatigue with minimal exertion -Case was discussed with DOD Dr. Lalla Brothers who pursuing left heart catheterization for further evaluation. -Continue Imdur 60 mg daily with additional 30 mg as needed -Continue Toprol-XL 80 mg Nitrostat 0.4 mg as needed -Patient advised and is aware of hypotension resulting from Viagra use nitrates -Advised to go to the ED if pain is not resolved after nitroglycerin -CBC and BMET today  2.Coronary artery disease: -History of CABG x 1 in 2002 with Lexiscan Myoview completed 05/2021 with no evidence of ischemia -Reports stable angina left heart cath for further evaluation as noted above -Continue GDMT with Lipitor 80 mg daily, Plavix 75 mg daily 140 mg metoprolol 50 mg daily Imdur 60 mg daily as needed 30 mg  3.Essential hypertension: -Patient's blood pressure is 124/86 -Continue Toprol-XL 50 mg daily  4.HFpEF: -Most recent 2D echo completed 10/2021 with EF of 60-65%, no RWMA, with trivial TVR and MVR  -Patient is euvolemic on examination today   5.  DM  type II: -Patient's last hemoglobin A1c was 5.5 -Continue current treatment plan per PCP  6.History of  Atrial Tachycardia:  -Patient recently completed Zio patch and is scheduled to follow-up with Dr. Jimmey Ralph few weeks. -Continue Toprol-XL 50 mg daily   Disposition: Follow-up with None or APP in 2 weeks Informed Consent   Shared Decision Making/Informed Consent The risks [stroke (1 in 1000), death (1 in 1000), kidney failure [usually temporary] (1 in 500), bleeding (1 in 200), allergic reaction [possibly serious] (1 in 200)], benefits (diagnostic support and management of coronary artery disease) and alternatives of a cardiac catheterization were discussed in detail with Dalton [name] and he is willing to proceed.      Signed, Napoleon Form, Leodis Rains, NP 11/24/2022, 12:04 PM [facility]

## 2022-11-24 NOTE — Progress Notes (Signed)
Cardiology Office Note    Patient Name: Dalton [name] Date of Encounter: 11/24/2022  Primary Care Provider:  Fleet Contras, MD Primary Cardiologist:  None Primary Electrophysiologist: Nobie Putnam, MD   Past Medical History    Past Medical History:  Diagnosis Date   (HFpEF) heart failure with preserved ejection fraction (HCC)    Anemia    Anxiety    Asthma    Bipolar disorder (HCC)    Chronic back pain    Pain Clinic in [place]   Chronic bronchitis    COPD (chronic obstructive pulmonary disease) (HCC)    Coronary artery disease 2002   CABG (LIMA to LAD)   Depression    DVT (deep venous thrombosis) (HCC) ~ 2005   LLE   Frequency of urination    GERD (gastroesophageal reflux disease)    Headache(784.0)    History of seizures    Last 2011   HNP (herniated nucleus pulposus), cervical    Hyperlipidemia    Hypertension    MI (myocardial infarction) (HCC)    Neuropathy    NSAID-induced gastric ulcer    Rheumatoid arthritis (HCC)    Tonsillitis, chronic    Dr. [name] in [place]   Type II diabetes mellitus [facility])    Wears glasses     History of Present Illness  Dalton [name] is a 62 y.o. male with PMH of CAD s/p CABG x 1 in 2002 (LIMA to LAD), HFpEF, HTN, HLD, DM II, rheumatoid arthritis, DVT, bipolar disorder, peptic ulcer disease, left subclavian stent, past cocaine abuse who presents today for atrial tachycardia.  Dalton [name] was seen last on 08/25/2022 following a hospitalization for pyelonephritis.  His blood pressures were controlled during visit and patient reported feeling tired and fatigued easily.  He was taken off of lisinopril and blood pressures remained stable.  He continued to have frequent PACs and 7-day ZIO monitor was worn that showed 27% burden of SVT with possible aberrancy and patient was referred to EP for further evaluation.  He was advised to take an additional 50 mg of metoprolol for sustained palpitations.  He was referred to EP and  seen by Dr. Jimmey Ralph in metoprolol was switched to Toprol-XL 50 mg and event monitor was repeated with instruction to wear longer for better evaluation of tachycardia.  He indicated his wish to avoid procedural options at this time and will continue to pursue medical therapy with the possibility of adding antiarrhythmic medications.   During today's visit the patient reports that he has been experiencing episodes of fatigue and chest pressure along with shortness of breath and sweating with activities.  He reports discomfort with going up hills and gets very fatigued walking 4 blocks.  He has required as needed nitroglycerin 3 times since his previous follow-up.  He is currently on 2 nitrates continues to have breakthrough discomfort.  I discussed his case with Dr. Lalla Brothers who agreed that left heart cath would be the best course of action moving forward.  He has been compliant with his medications and denies any reactions.  He is scheduled to undergo a colonoscopy and EGD and was informed that this will have to be rescheduled pending his left heart catheterization.  Patient denies palpitations, dyspnea, PND, orthopnea, nausea, vomiting, dizziness, syncope, edema, weight gain, or early satiety.  Review of Systems  Please see the history of present illness.    All other systems reviewed and are otherwise negative except as noted above.  Physical Exam  Wt Readings from Last 3 Encounters:  11/04/22 259 lb 3.2 oz (117.6 kg)  09/27/22 258 lb (117 kg)  08/25/22 260 lb (117.9 kg)   FA:OZHYQ were no vitals filed for this visit.,There is no height or weight on file to calculate BMI. GEN: Well nourished, well developed in no acute distress Neck: No JVD; No carotid bruits Pulmonary: Clear to auscultation without rales, wheezing or rhonchi  Cardiovascular: Normal rate. Regular rhythm. Normal S1. Normal S2.   Murmurs: There is no murmur.  ABDOMEN: Soft, non-tender, non-distended EXTREMITIES:  No edema; No  deformity   EKG/LABS/ Recent Cardiac Studies   ECG personally reviewed by me today -sinus rhythm with PACs and rate of 65 bpm with no acute changes consistent with previous EKG.  Risk Assessment/Calculations:      STOP-Bang Score:         Lab Results  Component Value Date   WBC 3.2 (L) 08/23/2022   HGB 9.9 (L) 08/23/2022   HCT 29.2 (L) 08/23/2022   MCV 85.9 08/23/2022   PLT 163 08/23/2022   Lab Results  Component Value Date   CREATININE 0.76 08/25/2022   BUN 3 (L) 08/25/2022   NA 143 08/25/2022   K 3.7 08/25/2022   CL 106 08/25/2022   CO2 23 08/25/2022   Lab Results  Component Value Date   CHOL 70 05/20/2022   HDL 37 (L) 05/20/2022   LDLCALC 17 05/20/2022   TRIG 82 05/20/2022   CHOLHDL 1.9 05/20/2022    Lab Results  Component Value Date   HGBA1C 5.5 08/21/2022   Assessment & Plan    1. Chest Pain/stable angina: -Patient reports ongoing chest pressure with shortness of breath and fatigue with minimal exertion -Case was discussed with DOD Dr. Lalla Brothers who pursuing left heart catheterization for further evaluation. -Continue Imdur 60 mg daily with additional 30 mg as needed -Continue Toprol-XL 80 mg Nitrostat 0.4 mg as needed -Patient advised and is aware of hypotension resulting from Viagra use nitrates -Advised to go to the ED if pain is not resolved after nitroglycerin -CBC and BMET today  2.Coronary artery disease: -History of CABG x 1 in 2002 with Lexiscan Myoview completed 05/2021 with no evidence of ischemia -Reports stable angina left heart cath for further evaluation as noted above -Continue GDMT with Lipitor 80 mg daily, Plavix 75 mg daily 140 mg metoprolol 50 mg daily Imdur 60 mg daily as needed 30 mg  3.Essential hypertension: -Patient's blood pressure is 124/86 -Continue Toprol-XL 50 mg daily  4.HFpEF: -Most recent 2D echo completed 10/2021 with EF of 60-65%, no RWMA, with trivial TVR and MVR  -Patient is euvolemic on examination today   5.  DM  type II: -Patient's last hemoglobin A1c was 5.5 -Continue current treatment plan per PCP  6.History of  Atrial Tachycardia:  -Patient recently completed Zio patch and is scheduled to follow-up with Dr. Jimmey Ralph few weeks. -Continue Toprol-XL 50 mg daily   Disposition: Follow-up with None or APP in 2 weeks Informed Consent   Shared Decision Making/Informed Consent The risks [stroke (1 in 1000), death (1 in 1000), kidney failure [usually temporary] (1 in 500), bleeding (1 in 200), allergic reaction [possibly serious] (1 in 200)], benefits (diagnostic support and management of coronary artery disease) and alternatives of a cardiac catheterization were discussed in detail with Mr. Janee Morn and he is willing to proceed.      Signed, Napoleon Form, Leodis Rains, NP 11/24/2022, 12:04 PM [facility]

## 2022-11-26 ENCOUNTER — Encounter: Payer: Self-pay | Admitting: Nurse Practitioner

## 2022-11-26 ENCOUNTER — Ambulatory Visit: Payer: 59 | Attending: Nurse Practitioner | Admitting: Nurse Practitioner

## 2022-11-26 VITALS — BP 124/86 | HR 65 | Ht 70.0 in | Wt 251.4 lb

## 2022-11-26 DIAGNOSIS — I4719 Other supraventricular tachycardia: Secondary | ICD-10-CM

## 2022-11-26 DIAGNOSIS — Z01818 Encounter for other preprocedural examination: Secondary | ICD-10-CM

## 2022-11-26 DIAGNOSIS — I5032 Chronic diastolic (congestive) heart failure: Secondary | ICD-10-CM

## 2022-11-26 DIAGNOSIS — I2510 Atherosclerotic heart disease of native coronary artery without angina pectoris: Secondary | ICD-10-CM

## 2022-11-26 DIAGNOSIS — R079 Chest pain, unspecified: Secondary | ICD-10-CM

## 2022-11-26 DIAGNOSIS — I10 Essential (primary) hypertension: Secondary | ICD-10-CM

## 2022-11-26 DIAGNOSIS — E1159 Type 2 diabetes mellitus with other circulatory complications: Secondary | ICD-10-CM

## 2022-11-26 MED ORDER — FUROSEMIDE 20 MG PO TABS: 20.0000 mg | ORAL_TABLET | Freq: Two times a day (BID) | ORAL | 6 refills | Status: DC

## 2022-11-26 MED ORDER — ISOSORBIDE MONONITRATE ER 60 MG PO TB24: 60.0000 mg | ORAL_TABLET | Freq: Every day | ORAL | 1 refills | Status: DC

## 2022-11-26 MED ORDER — METOPROLOL SUCCINATE ER 50 MG PO TB24: 50.0000 mg | ORAL_TABLET | Freq: Every day | ORAL | 3 refills | Status: DC

## 2022-11-26 NOTE — Patient Instructions (Addendum)
Medication Instructions:  INCREASE Imdur to 60mg  once a day and a additional 30mg  as needed for chest pain  *If you need a refill on your cardiac medications before your next appointment, please call your pharmacy*   Lab Work: TODAY-BMET & CBC If you have labs (blood work) drawn today and your tests are completely normal, you will receive your results only by: MyChart Message (if you have MyChart) OR A paper copy in the mail If you have any lab test that is abnormal or we need to change your treatment, we will call you to review the results.   Testing/Procedures: Your physician has requested that you have a cardiac catheterization. Cardiac catheterization is used to diagnose and/or treat various heart conditions. Doctors may recommend this procedure for a number of different reasons. The most common reason is to evaluate chest pain. Chest pain can be a symptom of coronary artery disease (CAD), and cardiac catheterization can show whether plaque is narrowing or blocking your heart's arteries. This procedure is also used to evaluate the valves, as well as measure the blood flow and oxygen levels in different parts of your heart. For further information please visit [email or web address] Please follow instruction sheet, as given.   Follow-Up: At [facility], you and your health needs are our priority.  As part of our continuing mission to provide you with exceptional heart care, we have created designated Provider Care Teams.  These Care Teams include your primary Cardiologist (physician) and Advanced Practice Providers (APPs -  Physician Assistants and Nurse Practitioners) who all work together to provide you with the care you need, when you need it.  We recommend signing up for the patient portal called "MyChart".  Sign up information is provided on this After Visit Summary.  MyChart is used to connect with patients for Virtual Visits (Telemedicine).  Patients are able to view lab/test  results, encounter notes, upcoming appointments, etc.  Non-urgent messages can be sent to your provider as well.   To learn more about what you can do with MyChart, go to [email or web address].    Your next appointment:   3 week(s)  Provider:   Robin Searing, NP       Other Instructions            Cardiac Catheterization   You are scheduled for a Cardiac Catheterization on Wednesday, October 9 with Dr. Peter [place].  1. Please arrive at the [facility] (Main Entrance A) at [facility]: [address] [place], [place] [address] at 11:00 AM (This time is 2 hour(s) before your procedure to ensure your preparation). Free valet parking service is available. You will check in at ADMITTING. The support person will be asked to wait in the waiting room.  It is OK to have someone drop you off and come back when you are ready to be discharged.        Special note: Every effort is made to have your procedure done on time. Please understand that emergencies sometimes delay scheduled procedures.  2. Diet: Do not eat solid foods after midnight.  You may have clear liquids until 5 AM the day of the procedure.  3. Labs: You will need to have blood drawn on Friday, October 4 at [facility] at [facility]. [address], [place]  Open: 7:30am - 5pm    Phone: [phone]. You do not need to be fasting.  4. Medication instructions in preparation for your procedure:   Contrast  Allergy: No   Stop taking, Lasix (Furosemide)  Tuesday, October 8,   Do not take Diabetes Med Glucophage (Metformin) on the day of the procedure and HOLD 48 HOURS AFTER THE PROCEDURE.   HOLD Glipizide 48 hours prior to procedure   On the morning of your procedure, take Plavix/Clopidogrel and any morning medicines NOT listed above.  You may use sips of water.  5. Plan to go home the same day, you will only stay overnight if medically necessary. 6. You MUST have a responsible adult to  drive you home. 7. An adult MUST be with you the first 24 hours after you arrive home. 8. Bring a current list of your medications, and the last time and date medication taken. 9. Bring ID and current insurance cards. 10.Please wear clothes that are easy to get on and off and wear slip-on shoes.  Thank you for allowing [place] to care for you!   -- [facility]

## 2022-11-28 ENCOUNTER — Other Ambulatory Visit: Payer: Self-pay | Admitting: Nurse Practitioner

## 2022-11-29 ENCOUNTER — Other Ambulatory Visit (HOSPITAL_COMMUNITY)
Admission: RE | Admit: 2022-11-29 | Discharge: 2022-11-29 | Disposition: A | Discharge status: Home / Self Care | Payer: 59 | Source: Ambulatory Visit | Attending: Nurse Practitioner | Admitting: Nurse Practitioner

## 2022-11-29 DIAGNOSIS — E1159 Type 2 diabetes mellitus with other circulatory complications: Secondary | ICD-10-CM | POA: Insufficient documentation

## 2022-11-29 DIAGNOSIS — I4719 Other supraventricular tachycardia: Secondary | ICD-10-CM | POA: Diagnosis present

## 2022-11-29 DIAGNOSIS — I110 Hypertensive heart disease with heart failure: Secondary | ICD-10-CM | POA: Insufficient documentation

## 2022-11-29 DIAGNOSIS — R079 Chest pain, unspecified: Secondary | ICD-10-CM | POA: Insufficient documentation

## 2022-11-29 DIAGNOSIS — I5032 Chronic diastolic (congestive) heart failure: Secondary | ICD-10-CM | POA: Insufficient documentation

## 2022-11-29 DIAGNOSIS — I10 Essential (primary) hypertension: Secondary | ICD-10-CM | POA: Diagnosis present

## 2022-11-29 DIAGNOSIS — Z01812 Encounter for preprocedural laboratory examination: Secondary | ICD-10-CM | POA: Diagnosis not present

## 2022-11-29 DIAGNOSIS — Z01818 Encounter for other preprocedural examination: Secondary | ICD-10-CM | POA: Diagnosis present

## 2022-11-29 DIAGNOSIS — I2510 Atherosclerotic heart disease of native coronary artery without angina pectoris: Secondary | ICD-10-CM | POA: Insufficient documentation

## 2022-11-29 LAB — BASIC METABOLIC PANEL
Anion gap: 10 (ref 5–15)
BUN: 6 mg/dL — ABNORMAL LOW (ref 8–23)
CO2: 25 mmol/L (ref 22–32)
Calcium: 8.8 mg/dL — ABNORMAL LOW (ref 8.9–10.3)
Chloride: 104 mmol/L (ref 98–111)
Creatinine, Ser: 0.98 mg/dL (ref 0.61–1.24)
GFR, Estimated: >60 mL/min (ref >60)
Glucose, Bld: 120 mg/dL — ABNORMAL HIGH (ref 70–99)
Potassium: 3.3 mmol/L — ABNORMAL LOW (ref 3.5–5.1)
Sodium: 139 mmol/L (ref 135–145)

## 2022-11-29 LAB — CBC
HCT: 34.5 % — ABNORMAL LOW (ref 39.0–52.0)
Hemoglobin: 11.9 g/dL — ABNORMAL LOW (ref 13.0–17.0)
MCH: 29.1 pg (ref 26.0–34.0)
MCHC: 34.5 g/dL (ref 30.0–36.0)
MCV: 84.4 fL (ref 80.0–100.0)
Platelets: 226 10*3/uL (ref 150–400)
RBC: 4.09 MIL/uL — ABNORMAL LOW (ref 4.22–5.81)
RDW: 13.3 % (ref 11.5–15.5)
WBC: 3.7 10*3/uL — ABNORMAL LOW (ref 4.0–10.5)
nRBC: 0 % (ref 0.0–0.2)

## 2022-11-30 ENCOUNTER — Telehealth: Payer: Self-pay | Admitting: *Deleted

## 2022-11-30 NOTE — Telephone Encounter (Addendum)
See 11/29/22 BMP results. Pt reports he is currently taking KCl 20 mEq two tablets daily-pt instructed to increase KCl to 60 mEq daily x 3 days starting today, then decrease dose, will plan BMP on arrival to Short Stay 12/01/22.

## 2022-11-30 NOTE — Telephone Encounter (Signed)
Cardiac Catheterization scheduled at [facility] for: Wednesday December 01, 2022 1 PM Arrival time [facility] Main Entrance A at: 10:30 AM  Nothing to eat after midnight prior to procedure, clear liquids until 5 AM day of procedure.  Medication instructions: -Hold:  Metformin-day of procedure and 48 hours post procedure  Glipizide/hydrochlorothiazide/Lasix-AM of procedure -Other usual morning medications can be taken with sips of water including aspirin 81 mg and Plavix 75 mg  Plan to go home the same day, you will only stay overnight if medically necessary.  You must have responsible adult to drive you home.  Someone must be with you the first 24 hours after you arrive home.  Reviewed procedure instructions with patient.

## 2022-12-01 ENCOUNTER — Encounter (HOSPITAL_COMMUNITY)
Admission: RE | Disposition: A | Discharge status: Home / Self Care | Payer: Self-pay | Source: Home / Self Care | Attending: Cardiology

## 2022-12-01 ENCOUNTER — Ambulatory Visit (HOSPITAL_COMMUNITY)
Admission: RE | Admit: 2022-12-01 | Discharge: 2022-12-01 | Disposition: A | Discharge status: Home / Self Care | Payer: 59 | Attending: Cardiology | Admitting: Cardiology

## 2022-12-01 ENCOUNTER — Other Ambulatory Visit: Payer: Self-pay

## 2022-12-01 DIAGNOSIS — I25118 Atherosclerotic heart disease of native coronary artery with other forms of angina pectoris: Secondary | ICD-10-CM | POA: Insufficient documentation

## 2022-12-01 DIAGNOSIS — Z951 Presence of aortocoronary bypass graft: Secondary | ICD-10-CM | POA: Diagnosis not present

## 2022-12-01 DIAGNOSIS — I110 Hypertensive heart disease with heart failure: Secondary | ICD-10-CM | POA: Diagnosis not present

## 2022-12-01 DIAGNOSIS — Z7902 Long term (current) use of antithrombotics/antiplatelets: Secondary | ICD-10-CM | POA: Insufficient documentation

## 2022-12-01 DIAGNOSIS — I2510 Atherosclerotic heart disease of native coronary artery without angina pectoris: Secondary | ICD-10-CM | POA: Insufficient documentation

## 2022-12-01 DIAGNOSIS — I4719 Other supraventricular tachycardia: Secondary | ICD-10-CM | POA: Insufficient documentation

## 2022-12-01 DIAGNOSIS — M069 Rheumatoid arthritis, unspecified: Secondary | ICD-10-CM | POA: Insufficient documentation

## 2022-12-01 DIAGNOSIS — Z86718 Personal history of other venous thrombosis and embolism: Secondary | ICD-10-CM | POA: Diagnosis not present

## 2022-12-01 DIAGNOSIS — F319 Bipolar disorder, unspecified: Secondary | ICD-10-CM | POA: Diagnosis not present

## 2022-12-01 DIAGNOSIS — F1410 Cocaine abuse, uncomplicated: Secondary | ICD-10-CM | POA: Insufficient documentation

## 2022-12-01 DIAGNOSIS — I5032 Chronic diastolic (congestive) heart failure: Secondary | ICD-10-CM | POA: Insufficient documentation

## 2022-12-01 DIAGNOSIS — Z79899 Other long term (current) drug therapy: Secondary | ICD-10-CM | POA: Diagnosis not present

## 2022-12-01 DIAGNOSIS — E785 Hyperlipidemia, unspecified: Secondary | ICD-10-CM | POA: Diagnosis not present

## 2022-12-01 DIAGNOSIS — R079 Chest pain, unspecified: Secondary | ICD-10-CM | POA: Diagnosis not present

## 2022-12-01 DIAGNOSIS — E1140 Type 2 diabetes mellitus with diabetic neuropathy, unspecified: Secondary | ICD-10-CM | POA: Insufficient documentation

## 2022-12-01 DIAGNOSIS — Z01812 Encounter for preprocedural laboratory examination: Secondary | ICD-10-CM

## 2022-12-01 HISTORY — PX: LEFT HEART CATH AND CORS/GRAFTS ANGIOGRAPHY: CATH118250

## 2022-12-01 LAB — BASIC METABOLIC PANEL
Anion gap: 8 (ref 5–15)
BUN: 7 mg/dL — ABNORMAL LOW (ref 8–23)
CO2: 27 mmol/L (ref 22–32)
Calcium: 8.8 mg/dL — ABNORMAL LOW (ref 8.9–10.3)
Chloride: 105 mmol/L (ref 98–111)
Creatinine, Ser: 1.1 mg/dL (ref 0.61–1.24)
GFR, Estimated: >60 mL/min (ref >60)
Glucose, Bld: 89 mg/dL (ref 70–99)
Potassium: 3.9 mmol/L (ref 3.5–5.1)
Sodium: 140 mmol/L (ref 135–145)

## 2022-12-01 LAB — GLUCOSE, CAPILLARY
Glucose-Capillary: 90 mg/dL (ref 70–99)
Glucose-Capillary: 69 mg/dL — ABNORMAL LOW (ref 70–99)

## 2022-12-01 SURGERY — LEFT HEART CATH AND CORS/GRAFTS ANGIOGRAPHY
Anesthesia: LOCAL

## 2022-12-01 MED ORDER — ACETAMINOPHEN 325 MG PO TABS: 650.0000 mg | ORAL_TABLET | ORAL | Status: DC | PRN

## 2022-12-01 MED ORDER — SODIUM CHLORIDE 0.9 % IV SOLN: 250.0000 mL | INTRAVENOUS | Status: DC | PRN

## 2022-12-01 MED ORDER — SODIUM CHLORIDE 0.9 % WEIGHT BASED INFUSION
3.0000 mL/kg/h | INTRAVENOUS | Status: AC
  Administered 2022-12-01: 3 mL/kg/h via INTRAVENOUS

## 2022-12-01 MED ORDER — SODIUM CHLORIDE 0.9% FLUSH: 3.0000 mL | INTRAVENOUS | Status: DC | PRN

## 2022-12-01 MED ORDER — CLOPIDOGREL BISULFATE 75 MG PO TABS
75.0000 mg | ORAL_TABLET | Freq: Once | ORAL | Status: AC
  Administered 2022-12-01: 75 mg via ORAL
  Filled 2022-12-01: qty 1

## 2022-12-01 MED ORDER — HEPARIN (PORCINE) IN NACL 1000-0.9 UT/500ML-% IV SOLN
INTRAVENOUS | Status: DC | PRN
  Administered 2022-12-01 (×2): 500 mL

## 2022-12-01 MED ORDER — MIDAZOLAM HCL 2 MG/2ML IJ SOLN
INTRAMUSCULAR | Status: AC
  Filled 2022-12-01: qty 2

## 2022-12-01 MED ORDER — LIDOCAINE HCL (PF) 1 % IJ SOLN
INTRAMUSCULAR | Status: AC
  Filled 2022-12-01: qty 30

## 2022-12-01 MED ORDER — FENTANYL CITRATE (PF) 100 MCG/2ML IJ SOLN
INTRAMUSCULAR | Status: DC | PRN
  Administered 2022-12-01: 25 ug via INTRAVENOUS

## 2022-12-01 MED ORDER — LIDOCAINE HCL (PF) 1 % IJ SOLN
INTRAMUSCULAR | Status: DC | PRN
  Administered 2022-12-01: 5 mL via INTRADERMAL

## 2022-12-01 MED ORDER — SODIUM CHLORIDE 0.9% FLUSH: 3.0000 mL | Freq: Two times a day (BID) | INTRAVENOUS | Status: DC

## 2022-12-01 MED ORDER — HYDRALAZINE HCL 20 MG/ML IJ SOLN: 10.0000 mg | INTRAMUSCULAR | Status: DC | PRN

## 2022-12-01 MED ORDER — SODIUM CHLORIDE 0.9 % WEIGHT BASED INFUSION: 1.0000 mL/kg/h | INTRAVENOUS | Status: DC

## 2022-12-01 MED ORDER — IOHEXOL 350 MG/ML SOLN
INTRAVENOUS | Status: DC | PRN
  Administered 2022-12-01: 45 mL

## 2022-12-01 MED ORDER — METFORMIN HCL ER 500 MG PO TB24: 500.0000 mg | ORAL_TABLET | Freq: Two times a day (BID) | ORAL | Status: AC

## 2022-12-01 MED ORDER — ASPIRIN 81 MG PO CHEW: 81.0000 mg | CHEWABLE_TABLET | ORAL | Status: DC

## 2022-12-01 MED ORDER — MIDAZOLAM HCL 2 MG/2ML IJ SOLN
INTRAMUSCULAR | Status: DC | PRN
  Administered 2022-12-01: 1 mg via INTRAVENOUS

## 2022-12-01 MED ORDER — FENTANYL CITRATE (PF) 100 MCG/2ML IJ SOLN
INTRAMUSCULAR | Status: AC
  Filled 2022-12-01: qty 2

## 2022-12-01 SURGICAL SUPPLY — 8 items
CATH INFINITI 5FR MULTPACK ANG (CATHETERS) IMPLANT
CLOSURE MYNX CONTROL 5F (Vascular Products) IMPLANT
KIT MICROPUNCTURE NIT STIFF (SHEATH) IMPLANT
PACK CARDIAC CATHETERIZATION (CUSTOM PROCEDURE TRAY) ×1 IMPLANT
SET ATX-X65L (MISCELLANEOUS) IMPLANT
SHEATH PINNACLE 5F 10CM (SHEATH) IMPLANT
SHEATH PROBE COVER 6X72 (BAG) IMPLANT
WIRE EMERALD 3MM-J .035X150CM (WIRE) IMPLANT

## 2022-12-01 NOTE — Discharge Instructions (Signed)
Femoral Site Care This sheet gives you information about how to care for yourself after your procedure. Your health care provider may also give you more specific instructions. If you have problems or questions, contact your health care provider. What can I expect after the procedure?  After the procedure, it is common to have: Bruising that usually fades within 1-2 weeks. Tenderness at the site. Follow these instructions at home: Wound care Follow instructions from your health care provider about how to take care of your insertion site. Make sure you: Wash your hands with soap and water before you change your bandage (dressing). If soap and water are not available, use hand sanitizer. Remove your dressing as told by your health care provider. In 24 hours Do not take baths, swim, or use a hot tub until your health care provider approves. You may shower 24-48 hours after the procedure or as told by your health care provider. Gently wash the site with plain soap and water. Pat the area dry with a clean towel. Do not rub the site. This may cause bleeding. Do not apply powder or lotion to the site. Keep the site clean and dry. Check your femoral site every day for signs of infection. Check for: Redness, swelling, or pain. Fluid or blood. Warmth. Pus or a bad smell. Activity For the first 2-3 days after your procedure, or as long as directed: Avoid climbing stairs as much as possible. Do not squat. Do not lift anything that is heavier than 10 lb (4.5 kg), or the limit that you are told, until your health care provider says that it is safe. For 5 days Rest as directed. Avoid sitting for a long time without moving. Get up to take short walks every 1-2 hours. Do not drive for 24 hours if you were given a medicine to help you relax (sedative). General instructions Take over-the-counter and prescription medicines only as told by your health care provider. Keep all follow-up visits as told by  your health care provider. This is important. Contact a health care provider if you have: A fever or chills. You have redness, swelling, or pain around your insertion site. Get help right away if: The catheter insertion area swells very fast. You pass out. You suddenly start to sweat or your skin gets clammy. The catheter insertion area is bleeding, and the bleeding does not stop when you hold steady pressure on the area. The area near or just beyond the catheter insertion site becomes pale, cool, tingly, or numb. These symptoms may represent a serious problem that is an emergency. Do not wait to see if the symptoms will go away. Get medical help right away. Call your local emergency services (911 in the [place] Do not drive yourself to the hospital. Summary After the procedure, it is common to have bruising that usually fades within 1-2 weeks. Check your femoral site every day for signs of infection. Do not lift anything that is heavier than 10 lb (4.5 kg), or the limit that you are told, until your health care provider says that it is safe. This information is not intended to replace advice given to you by your health care provider. Make sure you discuss any questions you have with your health care provider. Document Revised: 02/21/2017 Document Reviewed: 02/21/2017 Elsevier Patient Education  2020 [employer]vier Inc[employer] 

## 2022-12-01 NOTE — Interval H&P Note (Signed)
History and Physical Interval Note:  12/01/2022 1:45 PM  Dalton [name]  has presented today for surgery, with the diagnosis of stable angina and chest pain.  The various methods of treatment have been discussed with the patient and family. After consideration of risks, benefits and other options for treatment, the patient has consented to  Procedure(s): LEFT HEART CATH AND CORS/GRAFTS ANGIOGRAPHY (N/A) as a surgical intervention.  The patient's history has been reviewed, patient examined, no change in status, stable for surgery.  I have reviewed the patient's chart and labs.  Questions were answered to the patient's satisfaction.   Cath Lab Visit (complete for each Cath Lab visit)  Clinical Evaluation Leading to the Procedure:   ACS: No.  Non-ACS:    Anginal Classification: CCS III  Anti-ischemic medical therapy: Maximal Therapy (2 or more classes of medications)  Non-Invasive Test Results: Low-risk stress test findings: cardiac mortality <1%/year  Prior CABG: Previous CABG        Dalton [name] [facility] 12/01/2022 1:45 PM

## 2022-12-02 ENCOUNTER — Encounter (HOSPITAL_COMMUNITY): Payer: Self-pay | Admitting: Cardiology

## 2022-12-12 NOTE — Progress Notes (Deleted)
Cardiology Office Note    Patient Name: Dalton [name] Date of Encounter: 12/12/2022  Primary Care Provider:  Fleet Contras, MD Primary Cardiologist:  None Primary Electrophysiologist: Nobie Putnam, MD   Past Medical History    Past Medical History:  Diagnosis Date   (HFpEF) heart failure with preserved ejection fraction (HCC)    Anemia    Anxiety    Asthma    Bipolar disorder (HCC)    Chronic back pain    Pain Clinic in [place]   Chronic bronchitis    COPD (chronic obstructive pulmonary disease) (HCC)    Coronary artery disease 2002   CABG (LIMA to LAD)   Depression    DVT (deep venous thrombosis) (HCC) ~ 2005   LLE   Frequency of urination    GERD (gastroesophageal reflux disease)    Headache(784.0)    History of seizures    Last 2011   HNP (herniated nucleus pulposus), cervical    Hyperlipidemia    Hypertension    MI (myocardial infarction) (HCC)    Neuropathy    NSAID-induced gastric ulcer    Rheumatoid arthritis (HCC)    Tonsillitis, chronic    Dr. [name] in [place]   Type II diabetes mellitus [facility])    Wears glasses     History of Present Illness  Dalton [name] is a 62 y.o. male with PMH of CAD s/p CABG x 1 in 2002 (LIMA to LAD), HFpEF, HTN, HLD, DM II, rheumatoid arthritis, DVT, bipolar disorder, peptic ulcer disease, left subclavian stent, past cocaine abuse who presents today for post left heart cath follow-up.  Dalton [name] was last seen on 11/26/2022 for follow-up of atrial tachycardia and was noted to have increased chest pain and shortness of breath with exertion.  He also reported fatigue and discomfort that required 3 nitroglycerin.  I discussed his case with our DOD Dr. Lalla Brothers who agreed with repeat LHC for further evaluation.  Dalton [name] underwent his heart catheterization on 11/25/2021 which revealed normal coronaries with a atretic LIMA to LAD and left subclavian stent in place with plan for medical management.  During today's  visit the patient reports*** .  Patient denies chest pain, palpitations, dyspnea, PND, orthopnea, nausea, vomiting, dizziness, syncope, edema, weight gain, or early satiety.  ***Notes: -Last ischemic evaluation: -Last echo: -Interim ED visits: Review of Systems  Please see the history of present illness.    All other systems reviewed and are otherwise negative except as noted above.  Physical Exam    Wt Readings from Last 3 Encounters:  12/01/22 250 lb (113.4 kg)  11/26/22 251 lb 6.4 oz (114 kg)  11/04/22 259 lb 3.2 oz (117.6 kg)   ZO:XWRUE were no vitals filed for this visit.,There is no height or weight on file to calculate BMI. GEN: Well nourished, well developed in no acute distress Neck: No JVD; No carotid bruits Pulmonary: Clear to auscultation without rales, wheezing or rhonchi  Cardiovascular: Normal rate. Regular rhythm. Normal S1. Normal S2.   Murmurs: There is no murmur.  ABDOMEN: Soft, non-tender, non-distended EXTREMITIES:  No edema; No deformity   EKG/LABS/ Recent Cardiac Studies   ECG personally reviewed by me today - ***  Risk Assessment/Calculations:   {Does this patient have ATRIAL FIBRILLATION?[phone]}  STOP-Bang Score:     { Consider Dx Sleep Disordered Breathing or Sleep Apnea  ICD G47.33          :1}    Lab Results  Component Value Date  WBC 3.7 (L) 11/29/2022   HGB 11.9 (L) 11/29/2022   HCT 34.5 (L) 11/29/2022   MCV 84.4 11/29/2022   PLT 226 11/29/2022   Lab Results  Component Value Date   CREATININE 1.10 12/01/2022   BUN 7 (L) 12/01/2022   NA 140 12/01/2022   K 3.9 12/01/2022   CL 105 12/01/2022   CO2 27 12/01/2022   Lab Results  Component Value Date   CHOL 70 05/20/2022   HDL 37 (L) 05/20/2022   LDLCALC 17 05/20/2022   TRIG 82 05/20/2022   CHOLHDL 1.9 05/20/2022    Lab Results  Component Value Date   HGBA1C 5.5 08/21/2022   Assessment & Plan    1. Chest Pain/stable angina: -Patient complains of fatigue and shortness of  breath during prior visit and underwent LHC that showed no new blockages and normal coronaries. -Today patient reports*** 2.Coronary artery disease: -History of CABG x 1 in 2002 with Lexiscan Myoview completed 05/2021 with no evidence of ischemia -Today patient reports*** -Continue GDMT with Lipitor 80 mg daily, Plavix 75 mg daily 140 mg metoprolol 50 mg daily Imdur 60 mg daily as needed 30 mg   3.Essential hypertension: -Patient's blood pressure is*** -Continue Toprol-XL 50 mg daily   4.HFpEF: -Most recent 2D echo completed 10/2021 with EF of 60-65%, no RWMA, with trivial TVR and MVR  -Patient is euvolemic on examination today     5.  DM type II: -Patient's last hemoglobin A1c was 5.5 -Continue current treatment plan per PCP      Disposition: Follow-up with None or APP in *** months {Are you ordering a CV Procedure (e.g. stress test, cath, DCCV, TEE, etc)?   Press F2        :[id number]}   Signed, Napoleon Form, Leodis Rains, NP 12/12/2022, 2:52 PM [facility]

## 2022-12-13 ENCOUNTER — Ambulatory Visit: Payer: 59 | Attending: Nurse Practitioner | Admitting: Nurse Practitioner

## 2022-12-13 DIAGNOSIS — R079 Chest pain, unspecified: Secondary | ICD-10-CM

## 2022-12-13 DIAGNOSIS — I10 Essential (primary) hypertension: Secondary | ICD-10-CM

## 2022-12-13 DIAGNOSIS — E1159 Type 2 diabetes mellitus with other circulatory complications: Secondary | ICD-10-CM

## 2022-12-13 DIAGNOSIS — I4719 Other supraventricular tachycardia: Secondary | ICD-10-CM

## 2022-12-13 DIAGNOSIS — I2510 Atherosclerotic heart disease of native coronary artery without angina pectoris: Secondary | ICD-10-CM

## 2022-12-23 ENCOUNTER — Other Ambulatory Visit: Payer: Self-pay

## 2022-12-23 ENCOUNTER — Emergency Department (HOSPITAL_COMMUNITY): Payer: 59

## 2022-12-23 ENCOUNTER — Encounter (HOSPITAL_COMMUNITY): Payer: Self-pay

## 2022-12-23 ENCOUNTER — Emergency Department (HOSPITAL_COMMUNITY)
Admission: EM | Admit: 2022-12-23 | Discharge: 2022-12-23 | Disposition: A | Discharge status: Home / Self Care | Payer: 59 | Attending: Student | Admitting: Student

## 2022-12-23 DIAGNOSIS — I10 Essential (primary) hypertension: Secondary | ICD-10-CM | POA: Diagnosis not present

## 2022-12-23 DIAGNOSIS — K219 Gastro-esophageal reflux disease without esophagitis: Secondary | ICD-10-CM | POA: Diagnosis not present

## 2022-12-23 DIAGNOSIS — E119 Type 2 diabetes mellitus without complications: Secondary | ICD-10-CM | POA: Insufficient documentation

## 2022-12-23 DIAGNOSIS — Z7901 Long term (current) use of anticoagulants: Secondary | ICD-10-CM | POA: Diagnosis not present

## 2022-12-23 DIAGNOSIS — J069 Acute upper respiratory infection, unspecified: Secondary | ICD-10-CM | POA: Diagnosis not present

## 2022-12-23 DIAGNOSIS — Z1152 Encounter for screening for COVID-19: Secondary | ICD-10-CM | POA: Insufficient documentation

## 2022-12-23 DIAGNOSIS — J029 Acute pharyngitis, unspecified: Secondary | ICD-10-CM | POA: Diagnosis present

## 2022-12-23 DIAGNOSIS — Z7984 Long term (current) use of oral hypoglycemic drugs: Secondary | ICD-10-CM | POA: Diagnosis not present

## 2022-12-23 LAB — CBC WITH DIFFERENTIAL/PLATELET
Abs Immature Granulocytes: 0.02 10*3/uL (ref 0.00–0.07)
Basophils Absolute: 0 10*3/uL (ref 0.0–0.1)
Basophils Relative: 0 %
Eosinophils Absolute: 0.2 10*3/uL (ref 0.0–0.5)
Eosinophils Relative: 4 %
HCT: 37.7 % — ABNORMAL LOW (ref 39.0–52.0)
Hemoglobin: 12.9 g/dL — ABNORMAL LOW (ref 13.0–17.0)
Immature Granulocytes: 0 %
Lymphocytes Relative: 21 %
Lymphs Abs: 1.4 10*3/uL (ref 0.7–4.0)
MCH: 29.2 pg (ref 26.0–34.0)
MCHC: 34.2 g/dL (ref 30.0–36.0)
MCV: 85.3 fL (ref 80.0–100.0)
Monocytes Absolute: 0.4 10*3/uL (ref 0.1–1.0)
Monocytes Relative: 7 %
Neutro Abs: 4.3 10*3/uL (ref 1.7–7.7)
Neutrophils Relative %: 68 %
Platelets: 221 10*3/uL (ref 150–400)
RBC: 4.42 MIL/uL (ref 4.22–5.81)
RDW: 13.9 % (ref 11.5–15.5)
WBC: 6.4 10*3/uL (ref 4.0–10.5)
nRBC: 0 % (ref 0.0–0.2)

## 2022-12-23 LAB — TROPONIN I (HIGH SENSITIVITY): Troponin I (High Sensitivity): 3 ng/L (ref <18)

## 2022-12-23 LAB — BASIC METABOLIC PANEL
Anion gap: 10 (ref 5–15)
BUN: 6 mg/dL — ABNORMAL LOW (ref 8–23)
CO2: 27 mmol/L (ref 22–32)
Calcium: 9.7 mg/dL (ref 8.9–10.3)
Chloride: 99 mmol/L (ref 98–111)
Creatinine, Ser: 0.96 mg/dL (ref 0.61–1.24)
GFR, Estimated: >60 mL/min (ref >60)
Glucose, Bld: 88 mg/dL (ref 70–99)
Potassium: 3.7 mmol/L (ref 3.5–5.1)
Sodium: 136 mmol/L (ref 135–145)

## 2022-12-23 LAB — SARS CORONAVIRUS 2 BY RT PCR: SARS Coronavirus 2 by RT PCR: NEGATIVE

## 2022-12-23 MED ORDER — ALUM & MAG HYDROXIDE-SIMETH 200-200-20 MG/5ML PO SUSP
30.0000 mL | Freq: Once | ORAL | Status: AC
  Administered 2022-12-23: 30 mL via ORAL
  Filled 2022-12-23: qty 30

## 2022-12-23 MED ORDER — DILTIAZEM HCL-DEXTROSE 125-5 MG/125ML-% IV SOLN (PREMIX): 5.0000 mg/h | INTRAVENOUS | Status: DC

## 2022-12-23 NOTE — ED Triage Notes (Signed)
Pt is complaining of burning in his throat and chest, runny nose, headache, that has been going on for about 4 days. Pt states he does have a productive cough.

## 2022-12-23 NOTE — ED Provider Triage Note (Signed)
Emergency Medicine Provider Triage Evaluation Note  Dalton [name] , a 62 y.o. male  was evaluated in triage.  Pt complains of substernal chest burning and nasal congestion.  Symptoms have been present for 3 days.  He is been taken over-the-counter Alka-Seltzer with little relief.  He reports that she had a coughing but denies fever and chills.  Review of Systems  Positive:  Negative: See above   Physical Exam  BP 129/84 (BP Location: Right Arm)   Pulse 93   Temp 97.8 F (36.6 C)   Resp 20   Ht 5\' 10"  (1.778 m)   Wt 106.6 kg   SpO2 100%   BMI 33.72 kg/m  Gen:   Awake, no distress   Resp:  Normal effort  MSK:   Moves extremities without difficulty  Other:    Medical Decision Making  Medically screening exam initiated at 6:18 PM.  Appropriate orders placed.  Dalton [name] was informed that the remainder of the evaluation will be completed by another provider, this initial triage assessment does not replace that evaluation, and the importance of remaining in the ED until their evaluation is complete.     Honor Loh [place], [place] 12/23/22 [phone]

## 2022-12-23 NOTE — Discharge Instructions (Addendum)
Today you were seen for an upper respiratory infection and a reflux.  Thank you for letting us treat you today. After reviewing your labs and imaging, I feel you are safe to go home. Please follow up with your PCP in the next several days and provide them with your records from this visit. Return to the Emergency Room if pain becomes severe or symptoms worsen.

## 2022-12-23 NOTE — ED Provider Notes (Signed)
[place] EMERGENCY DEPARTMENT AT [facility] Provider Note   CSN: [id number] Arrival date & time: 12/23/22  1506     History  Chief Complaint  Patient presents with   Chest Pain    Chest burning   Nasal Congestion    Dalton [name] is a 62 y.o. male past medical history significant for hypertension, diabetes, GERD, constipation presents today for 4 days of burning in his throat and chest, runny nose, headache.  Patient endorses productive cough, chills, body aches.  Patient denies nausea, vomiting, diarrhea, urinary symptoms.  Patient states he has tried TheraFlu, cough and cold medicine without relief.  Patient denies chest pain, shortness of breath.   Chest Pain Associated symptoms: fatigue        Home Medications Prior to Admission medications   Medication Sig Start Date End Date Taking? Authorizing Provider  albuterol (PROVENTIL) (2.5 MG/3ML) 0.083% nebulizer solution Take 3 mLs (2.5 mg total) by nebulization every 6 (six) hours as needed for wheezing or shortness of breath. 07/08/22   Eber Hong, MD  albuterol (VENTOLIN HFA) 108 (90 Base) MCG/ACT inhaler Inhale 2 puffs into the lungs every 4 (four) hours as needed for wheezing or shortness of breath. 07/08/22   Eber Hong, MD  ALPRAZolam Prudy Feeler) 1 MG tablet Take 2 mg by mouth 2 (two) times daily as needed for anxiety. 08/01/19   [provider]  atorvastatin (LIPITOR) 80 MG tablet Take 80 mg by mouth at bedtime. 12/05/20   [provider]  azelastine (ASTELIN) 0.1 % nasal spray Place 1 spray into both nostrils 2 (two) times daily. 12/29/21   [provider]  beclomethasone (QVAR) 80 MCG/ACT inhaler Inhale 2 puffs into the lungs 2 (two) times daily as needed (respiratory issues.).     [provider]  BELSOMRA 15 MG TABS Take 1 tablet by mouth at bedtime. 04/30/21   [provider]  brimonidine (ALPHAGAN P) 0.1 % SOLN Place 1 drop into the right eye 2 (two) times  daily. 12/25/20   [provider]  cetirizine (ZYRTEC) 10 MG tablet Take 10 mg by mouth daily as needed for allergies.  01/26/16   [provider]  clopidogrel (PLAVIX) 75 MG tablet Take 75 mg by mouth daily.    [provider]  dexlansoprazole (DEXILANT) 60 MG capsule Take 1 capsule (60 mg total) by mouth daily. 07/05/17   Fields, Darleene Cleaver, MD  esomeprazole (NEXIUM) 40 MG capsule Take 40 mg by mouth daily. 07/06/22   [provider]  Evolocumab (REPATHA SURECLICK) 140 MG/ML SOAJ Take 140 mg by mouth every 14 (fourteen) days. 03/10/22   Antoine Poche, MD  ezetimibe (ZETIA) 10 MG tablet Take 10 mg by mouth daily.    [provider]  fluticasone (FLONASE) 50 MCG/ACT nasal spray Place 2 sprays into both nostrils daily.    [provider]  Fluticasone Furoate (ARNUITY ELLIPTA) 200 MCG/ACT AEPB Inhale 1 puff into the lungs daily.    [provider]  furosemide (LASIX) 20 MG tablet Take 1 tablet (20 mg total) by mouth 2 (two) times daily. 11/26/22   Gaston Islam., NP  glipiZIDE (GLUCOTROL) 10 MG tablet Take 10 mg by mouth daily before breakfast.    [provider]  hydrochlorothiazide (HYDRODIURIL) 25 MG tablet Take 25 mg by mouth daily.    [provider]  isosorbide mononitrate (IMDUR) 60 MG 24 hr tablet Take 1 tablet (60 mg total) by mouth daily. Can take  an additional half tab as needed for chest pain 11/26/22   Gaston Islam., NP  ketoconazole (NIZORAL) 2 % shampoo Apply 1 application topically daily as needed for irritation.  10/23/19   [provider]  latanoprost (XALATAN) 0.005 % ophthalmic solution Place 1 drop into both eyes at bedtime.    [provider]  LINZESS 290 MCG CAPS capsule TAKE 1 CAPSULE(290 MCG) BY MOUTH DAILY BEFORE BREAKFAST Patient taking differently: Take 290 mcg by mouth daily before breakfast. May take 1 additional capsule as needed for constipation 02/04/21   Tiffany Kocher, PA-C  metFORMIN (GLUCOPHAGE-XR) 500 MG 24 hr tablet Take 1 tablet (500 mg total) by mouth in the morning and at bedtime. 12/04/22   [place], Peter M, MD  methocarbamol (ROBAXIN) 500 MG tablet Take by mouth as needed for muscle spasms.    [provider]  metoprolol succinate (TOPROL-XL) 50 MG 24 hr tablet Take 1 tablet (50 mg total) by mouth daily. Take with or immediately following a meal. 11/26/22 02/24/23  Gaston Islam., NP  MOVANTIK 25 MG TABS tablet Take 25 mg by mouth every morning. 07/09/22   [provider]  Multiple Vitamin (MULTIVITAMIN WITH MINERALS) TABS tablet Take 1 tablet by mouth daily.    [provider]  NARCAN 4 MG/0.1ML LIQD nasal spray kit Place 1 spray into the nose as needed (opioid overdose).  06/15/17   [provider]  nitroGLYCERIN (NITROSTAT) 0.4 MG SL tablet Place 1 tablet (0.4 mg total) under the tongue every 5 (five) minutes as needed for chest pain. 08/26/21   Johnson, Clanford L, MD  omeprazole (PRILOSEC) 20 MG capsule Take 20 mg by mouth daily. 07/21/22   [provider]  oxyCODONE-acetaminophen (PERCOCET) 10-325 MG tablet Take 1 tablet by mouth every 4 (four) hours as needed for pain.    [provider]  potassium chloride SA (KLOR-CON) 20 MEQ tablet Take 20 mEq by mouth 2 (two) times daily.    [provider]  pregabalin (LYRICA) 100 MG capsule Take 100 mg by mouth 2 (two) times daily.    [provider]  sertraline (ZOLOFT) 100 MG tablet Take 100 mg by mouth daily.    [provider]  sildenafil (VIAGRA) 100 MG tablet Take by mouth as needed for erectile dysfunction. 03/11/22   [provider]  tamsulosin (FLOMAX) 0.4 MG CAPS capsule Take 1 capsule (0.4 mg total) by mouth daily after breakfast. 01/07/16   Adonis Brook, NP  Vitamin D, Ergocalciferol, (DRISDOL) 1.25 MG (50000 UT) CAPS capsule Take 50,000 Units by mouth every 7 (seven) days. Monday    [provider]       Allergies    Tirzepatide, Gabapentin, Ibuprofen, Zolpidem tartrate, and Naproxen    Review of Systems   Review of Systems  Constitutional:  Positive for appetite change, chills and fatigue.  HENT:  Positive for congestion, rhinorrhea and sore throat.   Musculoskeletal:  Positive for myalgias.    Physical Exam Updated Vital Signs BP 129/85 (BP Location: Right Arm)   Pulse 90   Temp 97.8 F (36.6 C) (Oral)   Resp 16   Ht 5\' 10"  (1.778 m)   Wt 106.6 kg   SpO2 96%   BMI 33.72 kg/m  Physical Exam Vitals and nursing note reviewed.  Constitutional:      General: He is not in acute distress.    Appearance: He is well-developed.  HENT:     Head: Normocephalic  and atraumatic.     Nose:     Right Turbinates: Swollen.     Left Turbinates: Swollen.     Mouth/Throat:     Mouth: Mucous membranes are moist.     Pharynx: Uvula midline.     Tonsils: No tonsillar exudate.  Eyes:     Conjunctiva/sclera: Conjunctivae normal.     Pupils: Pupils are equal, round, and reactive to light.  Cardiovascular:     Rate and Rhythm: Normal rate and regular rhythm.     Heart sounds: No murmur heard. Pulmonary:     Effort: Pulmonary effort is normal. No respiratory distress.     Breath sounds: Normal breath sounds.  Abdominal:     Palpations: Abdomen is soft.     Tenderness: There is no abdominal tenderness.  Musculoskeletal:        General: No swelling.     Cervical back: Neck supple.     Right lower leg: No edema.     Left lower leg: No edema.  Skin:    General: Skin is warm and dry.     Capillary Refill: Capillary refill takes less than 2 seconds.  Neurological:     General: No focal deficit present.     Mental Status: He is alert.  Psychiatric:        Mood and Affect: Mood normal.     ED Results / Procedures / Treatments   Labs (all labs ordered are listed, but only abnormal results are displayed) Labs Reviewed  CBC WITH DIFFERENTIAL/PLATELET - Abnormal; Notable for the  following components:      Result Value   Hemoglobin 12.9 (*)    HCT 37.7 (*)    All other components within normal limits  BASIC METABOLIC PANEL - Abnormal; Notable for the following components:   BUN 6 (*)    All other components within normal limits  SARS CORONAVIRUS 2 BY RT PCR  TROPONIN I (HIGH SENSITIVITY)  TROPONIN I (HIGH SENSITIVITY)    EKG EKG Interpretation Date/Time:  Thursday December 23 2022 15:24:20 EDT Ventricular Rate:  102 PR Interval:  158 QRS Duration:  82 QT Interval:  324 QTC Calculation: 422 R Axis:   71  Text Interpretation: Sinus tachycardia with Premature atrial complexes Otherwise normal ECG When compared with ECG of 26-Nov-2022 10:15, Vent. rate has increased BY  37 BPM Confirmed by Kommor, Madison ([id number]) on 12/23/2022 8:58:45 PM  Radiology DG Chest 2 View  Result Date: 12/23/2022 CLINICAL DATA:  Productive cough with chest and throat burning. EXAM: CHEST - 2 VIEW COMPARISON:  September 27, 2022 FINDINGS: Multiple sternal wires and vascular clips are seen. The heart size and mediastinal contours are within normal limits. Low lung volumes are noted. Both lungs are clear. The visualized skeletal structures are unremarkable. IMPRESSION: 1. Evidence of prior median sternotomy. 2. No active cardiopulmonary disease. Electronically Signed   By: Aram Candela M.D.   On: 12/23/2022 19:45    Procedures Procedures    Medications Ordered in ED Medications  alum & mag hydroxide-simeth (MAALOX/MYLANTA) 200-200-20 MG/5ML suspension 30 mL (30 mLs Oral Given 12/23/22 1926)    ED Course/ Medical Decision Making/ A&P                                 Medical Decision Making Risk OTC drugs.   This patient presents to the ED with chief complaint(s) of chest burning, nasal congestion with pertinent past  medical history of hypertension, GERD which further complicates the presenting complaint. The complaint involves an extensive differential diagnosis and also carries  with it a high risk of complications and morbidity.    The differential diagnosis includes pneumonia, COVID, URI, STEMI, GERD   ED Course and Reassessment: Patient given Maalox  Independent labs interpretation:  The following labs were independently interpreted:  CBC: Mild anemia BMP: Mildly decreased upon Troponin: 3 COVID: Negative EKG: Sinus tach with PACs  Independent visualization of imaging: - I independently visualized the following imaging with scope of interpretation limited to determining acute life threatening conditions related to emergency care: Chest x-ray, which revealed evidence of prior median sternotomy, no active cardiopulmonary disease  Consultation: - Consulted or discussed management/test interpretation w/ external professional: None  Consideration for admission or further workup: Patient has been stable throughout ER stay.  Patient's physical exam, labs, imaging reassuring.  Patient's symptoms likely due to GERD and upper respiratory infection.         Final Clinical Impression(s) / ED Diagnoses Final diagnoses:  Upper respiratory tract infection, unspecified type  Gastroesophageal reflux disease, unspecified whether esophagitis present    Rx / DC Orders ED Discharge Orders     None         Gretta Began 12/23/22 2107    Glendora Score, MD 12/24/22 [phone]

## 2023-01-03 NOTE — Progress Notes (Unsigned)
Electrophysiology Office Note:   Date:  01/04/2023  ID:  Dalton [name], DOB 04-03-1960, MRN [id number]  Primary Cardiologist: None Electrophysiologist: Nobie Putnam, MD      History of Present Illness:   Dalton [name] is a 62 y.o. male with h/o PAC's, CAD s/p CABG (LIMA to LAD), LHC in 2013 with normal native coronaries & an atretic LIMA, chronic dCHF, HTN, HLD, DM II, RA, mild OSA seen today for routine electrophysiology followup.   Last seen in EP Clinic by Dr. Jimmey Ralph 10/2022 for evaluation of PAC's. He was transitioned to Toprol 50 mg BID at that time.  He followed up with Cardiology on 11/26/22 and reported chest pain, diaphoresis. He subsequently underwent LHC on 12/01/22 which showed LVEF 55-60%, normal coronary arteries, atretic LIMA to LAD, L [place] stent in place without pressure gradient.  He was seen again in ER on 10/31 for chest discomfort for 3 days, nasal congestion, cough / URI. Work up consistent with GERD in setting of URI (neg troponin).   Since last being seen in our clinic the patient reports he has had ~30-40% improvement in palpitations.  He describes feeling poorly after his COVID shot with increased nasal drainage, facial pressure, dark secretions, increased acid reflux / burning in his chest.   He denies dyspnea, PND, orthopnea, nausea, vomiting, dizziness, syncope, edema, weight gain, or early satiety.   Review of systems complete and found to be negative unless listed in HPI.   EP Information / Studies Reviewed:    EKG is not ordered today. EKG from 12/23/22 reviewed which showed ST with PAC's      Studies:  Coronary CTA 10/18/19 > coronary calcium score 0, normal coronary origins with no evidence of CAD Nuclear Stress Test 04/19/22 > normal study, no evidence of ischemia  ECHO 05/19/22 > LVEF 50-55%, no RWMA LTM 08/2022 > multiple episodes of SVT as long as 17 seconds, NSVT, frequent PAC's, atrial couplets/triplets, rare PVC's LTM 11/26/22 > HR 57 - 190,  average 78 bpm. Predominant underlying rhythm was normal sinus. There were frequent supraventricular ectopic beats and non-sustained SVT events with the longest lasting 16.8 seconds. No atrial fibrillation detected. Occasional ventricular ectopy, 1.2%. No symptom trigger episodes. LHC 12/01/22 > LVEF 55-60%, normal LVEDP, normal coronary arteries, atretic LIMA to LAD, L [place] stent in place without pressure gradient  Arrhythmia / AAD PAC's / SVT    Physical Exam:   VS:  BP (!) 134/58   Pulse 76   Ht 5\' 10"  (1.778 m)   Wt 261 lb (118.4 kg)   SpO2 97%   BMI 37.45 kg/m    Wt Readings from Last 3 Encounters:  01/04/23 261 lb (118.4 kg)  12/23/22 235 lb (106.6 kg)  12/01/22 250 lb (113.4 kg)     GEN: Well nourished, well developed in no acute distress NECK: No JVD; No carotid bruits CARDIAC: Regular rate and rhythm, no murmurs, rubs, gallops RESPIRATORY:  Clear to auscultation without rales, wheezing or rhonchi  ABDOMEN: Soft, non-tender, non-distended EXTREMITIES:  No edema; No deformity   ASSESSMENT AND PLAN:    Symptomatic PAC's Non-Sustained Atrial Tachycardia  -continue Toprol XL BID > adjust to 100mg  AM, 50 mg PM  -Zio with reduced events post titration of beta blocker -given continued symptoms, increase am dose of Toprol to 100mg  & evening 50mg   -asked pt to record symptoms and take note of burden over next weeks -if no improvement with Beta Blocker as above, consider flecainide or sotalol  at next visit  -pt would like to avoid procedures if possible, if fails medical therapy, consider EP study +/- ablation  -recent LHC reassuring   Burning in Chest / Chest Pressure Nasal Drainage  Acid Reflux, Throat Irritation -discussed that patient needs to get plugged back in with GI > was pending EGD, needs review for possible H. Pylori as culprit for GERD  -suspect based on his description of nasal sx that he has had a recent allergic component to his symptoms +/- sinus involvement  which could be triggering his asthma and "burning in chest" symptom. Additional laryngeal pharyngeal reflux as cause of "throat burning"  Occasional PVC's  -metoprolol as above   HTN  -well controlled on current agents  Follow up with Dr. Jimmey Ralph in 4 weeks  Signed, [name], MSN, APRN, NP-C, AGACNP-BC [facility] - Electrophysiology  01/04/2023, 9:51 AM

## 2023-01-04 ENCOUNTER — Ambulatory Visit: Payer: 59 | Attending: Pulmonary Disease | Admitting: Pulmonary Disease

## 2023-01-04 ENCOUNTER — Encounter: Payer: Self-pay | Admitting: Pulmonary Disease

## 2023-01-04 VITALS — BP 134/58 | HR 76 | Ht 70.0 in | Wt 261.0 lb

## 2023-01-04 DIAGNOSIS — I493 Ventricular premature depolarization: Secondary | ICD-10-CM | POA: Diagnosis not present

## 2023-01-04 DIAGNOSIS — I10 Essential (primary) hypertension: Secondary | ICD-10-CM | POA: Diagnosis not present

## 2023-01-04 DIAGNOSIS — I471 Supraventricular tachycardia, unspecified: Secondary | ICD-10-CM | POA: Diagnosis not present

## 2023-01-04 DIAGNOSIS — I4719 Other supraventricular tachycardia: Secondary | ICD-10-CM

## 2023-01-04 MED ORDER — METOPROLOL SUCCINATE ER 50 MG PO TB24: ORAL_TABLET | ORAL | 3 refills | Status: DC

## 2023-01-04 NOTE — Patient Instructions (Addendum)
Medication Instructions:  Your physician has recommended you make the following change in your medication:  Increase metoprolol succinate to 2 tablets (100 mg) in the morning and one tablet (50 mg) at night.  Lab Work: None ordered.  If you have labs (blood work) drawn today and your tests are completely normal, you will receive your results only by: MyChart Message (if you have MyChart) OR A paper copy in the mail If you have any lab test that is abnormal or we need to change your treatment, we will call you to review the results.  Testing/Procedures: None ordered.  Follow-Up: Follow up with your G.I. provider. At [facility], you and your health needs are our priority.  As part of our continuing mission to provide you with exceptional heart care, we have created designated Provider Care Teams.  These Care Teams include your primary Cardiologist (physician) and Advanced Practice Providers (APPs -  Physician Assistants and Nurse Practitioners) who all work together to provide you with the care you need, when you need it.   Your next appointment:   1 month  The format for your next appointment:   In Person  Provider:   Dr Jimmey Ralph or Earnest Rosier, NP     Important Information About Sugar

## 2023-01-31 NOTE — Progress Notes (Unsigned)
  Electrophysiology Office Note:   Date:  01/31/2023  ID:  GARLAND LYKE, DOB [birth year], MRN [id number]  Primary Cardiologist: None Electrophysiologist: Nobie Putnam, MD  {Click to update primary MD,subspecialty MD or APP then REFRESH:1}    History of Present Illness:   Dalton [name] is a 62 y.o. male with h/o PAC's, HTN, HLD, CAD s/p CABG, HFpEF, DM II, PAD,  Bipolar Disorder seen today for routine electrophysiology followup.   Seen in ER 12/23/22 with GERD in the setting of recent viral URI. He was discharged.  Since last being seen in our clinic the patient reports doing ***.  he denies chest pain, palpitations, dyspnea, PND, orthopnea, nausea, vomiting, dizziness, syncope, edema, weight gain, or early satiety.   Review of systems complete and found to be negative unless listed in HPI.   EP Information / Studies Reviewed:    EKG is not ordered today. EKG from 12/23/22 reviewed which showed ST with PAC's, 102 bpm      Studies:  Lexiscan Myoview 03/2022 > normal study / low risk, no ST deviation ECHO 04/2022 > LVEF 50-55%, no RWMA, G1DD Cardiac Monitor  3d, 08/2022 > predominant rhythm SR, NSVT up to 4 beats at maximum HR of 200bpm, multiple episodes of SVT as long as 17.5 seconds with fastest HR of 194 bpm consistent with PAT, frequent PAC's, atrial couplets/triplets, rare PVC's Cardiac Monitor, 13d, 11/2022 > predominant rhythm SR, frequent supraventricular ectopic beats & non-sustained SVT with longest lasting 16.8 sec, no AF, occasional ventricular ectopy 1.2% LHC 11/2022 > normal coronary arteries, normal LV function 55-65%, normal LVEDP, L [place] stent in place w/o pressure gradient   Arrhythmia / AAD ***    Risk Assessment/Calculations:   {Does this patient have ATRIAL FIBRILLATION?:[id number]} No BP recorded.  {Refresh Note OR Click here to enter BP  :1}***   STOP-Bang Score:     { Consider Dx Sleep Disordered Breathing or Sleep Apnea  ICD G47.33          :1}      Physical Exam:   VS:  There were no vitals taken for this visit.   Wt Readings from Last 3 Encounters:  01/04/23 261 lb (118.4 kg)  12/23/22 235 lb (106.6 kg)  12/01/22 250 lb (113.4 kg)     GEN: Well nourished, well developed in no acute distress NECK: No JVD; No carotid bruits CARDIAC: {EPRHYTHM:28826}, no murmurs, rubs, gallops RESPIRATORY:  Clear to auscultation without rales, wheezing or rhonchi  ABDOMEN: Soft, non-tender, non-distended EXTREMITIES:  No edema; No deformity   ASSESSMENT AND PLAN:    Frequent, Symptomatic PAC's Nonsustained AT -continue Toprol 50mg  BID   -most recent monitor with supraventricular ectopy, no sustained SVT -if further sx *** increase am dose of metoprol to 100mg  and reassess  -negative coronary CTA and LHC > could consider flecainide if recurrent symptoms or sotalol  -if he fails medical therapy, would not be unreasonable to have EPS +/- ablation  Occasional PVC's -metoprolol as above   Hypertension  -well controlled on current regimen ***  Follow up with {UVOZD:66440} {EPFOLLOW HK:74259}  Signed, [name], MSN, APRN, NP-C, AGACNP-BC [facility] - Electrophysiology  01/31/2023, 10:25 AM

## 2023-02-02 ENCOUNTER — Ambulatory Visit: Payer: 59 | Attending: Pulmonary Disease | Admitting: Pulmonary Disease

## 2023-02-02 DIAGNOSIS — I10 Essential (primary) hypertension: Secondary | ICD-10-CM

## 2023-02-02 DIAGNOSIS — I493 Ventricular premature depolarization: Secondary | ICD-10-CM

## 2023-02-02 DIAGNOSIS — I491 Atrial premature depolarization: Secondary | ICD-10-CM

## 2023-02-02 DIAGNOSIS — I471 Supraventricular tachycardia, unspecified: Secondary | ICD-10-CM

## 2023-02-02 DIAGNOSIS — R002 Palpitations: Secondary | ICD-10-CM

## 2023-02-02 DIAGNOSIS — I4719 Other supraventricular tachycardia: Secondary | ICD-10-CM

## 2023-02-03 ENCOUNTER — Encounter: Payer: Self-pay | Admitting: Pulmonary Disease

## 2023-02-20 NOTE — Progress Notes (Deleted)
Cardiology Office Note    Patient Name: Dalton [name] Date of Encounter: 02/20/2023  Primary Care Provider:  Fleet Contras, MD Primary Cardiologist:  None Primary Electrophysiologist: Nobie Putnam, MD   Past Medical History    Past Medical History:  Diagnosis Date   (HFpEF) heart failure with preserved ejection fraction (HCC)    Anemia    Anxiety    Asthma    Bipolar disorder (HCC)    Chronic back pain    Pain Clinic in [place]   Chronic bronchitis    COPD (chronic obstructive pulmonary disease) (HCC)    Coronary artery disease 2002   CABG (LIMA to LAD)   Depression    DVT (deep venous thrombosis) (HCC) ~ 2005   LLE   Frequency of urination    GERD (gastroesophageal reflux disease)    Headache(784.0)    History of seizures    Last 2011   HNP (herniated nucleus pulposus), cervical    Hyperlipidemia    Hypertension    MI (myocardial infarction) (HCC)    Neuropathy    NSAID-induced gastric ulcer    Rheumatoid arthritis (HCC)    Tonsillitis, chronic    Dr. [name] in [place]   Type II diabetes mellitus [facility])    Wears glasses     History of Present Illness  Dalton [name] is a 62 y.o. male with PMH of CAD s/p CABG x 1 in 2002 (LIMA to LAD), HFpEF, HTN, HLD, DM II, rheumatoid arthritis, DVT, left subclavian stent, bipolar disorder, peptic ulcer disease, left subclavian stent, past cocaine abuse who presents today for 76-month follow-up.  Dalton [name] was last seen on 11/26/22 and reported episodes of fatigue and chest pain with shortness of breath and sweating.  He noted breakthrough discomfort with nitroglycerin therapy.  I discussed his symptoms with DOD Dr. Lalla Brothers who recommended left heart cath for further evaluation.  He underwent LHC that showed a atretic LIMA to LAD otherwise normal coronary arteries with normal LV systolic function.  He was seen in the ED on 10/31 with chest discomfort and found to have URI and started on antibiotics.  He was seen  by [name], NP on 01/04/2023 for EP follow-up.  He wore a ZIO monitor that showed reduced events Toprol was adjusted to 100 in the a.m. and 50 in p.m. with plan to discuss flecainide or sotalol at next visit if symptoms persist.  During today's visit the patient reports*** .  Patient denies chest pain, palpitations, dyspnea, PND, orthopnea, nausea, vomiting, dizziness, syncope, edema, weight gain, or early satiety.  ***Notes: -Last ischemic evaluation: -Last echo: -Interim ED visits: Review of Systems  Please see the history of present illness.    All other systems reviewed and are otherwise negative except as noted above.  Physical Exam    Wt Readings from Last 3 Encounters:  01/04/23 261 lb (118.4 kg)  12/23/22 235 lb (106.6 kg)  12/01/22 250 lb (113.4 kg)   ZO:XWRUE were no vitals filed for this visit.,There is no height or weight on file to calculate BMI. GEN: Well nourished, well developed in no acute distress Neck: No JVD; No carotid bruits Pulmonary: Clear to auscultation without rales, wheezing or rhonchi  Cardiovascular: Normal rate. Regular rhythm. Normal S1. Normal S2.   Murmurs: There is no murmur.  ABDOMEN: Soft, non-tender, non-distended EXTREMITIES:  No edema; No deformity   EKG/LABS/ Recent Cardiac Studies   ECG personally reviewed by me today - ***  Risk Assessment/Calculations:   {  Does this patient have ATRIAL FIBRILLATION?:[phone]}  STOP-Bang Score:     { Consider Dx Sleep Disordered Breathing or Sleep Apnea  ICD G47.33          :1}    Lab Results  Component Value Date   WBC 6.4 12/23/2022   HGB 12.9 (L) 12/23/2022   HCT 37.7 (L) 12/23/2022   MCV 85.3 12/23/2022   PLT 221 12/23/2022   Lab Results  Component Value Date   CREATININE 0.96 12/23/2022   BUN 6 (L) 12/23/2022   NA 136 12/23/2022   K 3.7 12/23/2022   CL 99 12/23/2022   CO2 27 12/23/2022   Lab Results  Component Value Date   CHOL 70 05/20/2022   HDL 37 (L) 05/20/2022    LDLCALC 17 05/20/2022   TRIG 82 05/20/2022   CHOLHDL 1.9 05/20/2022    Lab Results  Component Value Date   HGBA1C 5.5 08/21/2022   Assessment & Plan    1.Coronary artery disease: -History of CABG x 1 in 2002 with Lexiscan Myoview completed 05/2021 with no evidence of ischemia and LHC performed showing a atretic LIMA but otherwise normal coronaries  2.Essential hypertension: -Patient's blood pressure is   3.History of  Atrial Tachycardia:   4.DM type II: -Patient's last hemoglobin A1c was 5.5 -Continue current treatment plan per PCP      Disposition: Follow-up with None or APP in *** months {Are you ordering a CV Procedure (e.g. stress test, cath, DCCV, TEE, etc)?   Press F2        :[id number]}   Signed, Napoleon Form, Leodis Rains, NP 02/20/2023, 1:18 PM [facility]

## 2023-02-21 ENCOUNTER — Ambulatory Visit: Payer: 59 | Attending: Nurse Practitioner | Admitting: Nurse Practitioner

## 2023-02-21 DIAGNOSIS — E1159 Type 2 diabetes mellitus with other circulatory complications: Secondary | ICD-10-CM

## 2023-02-21 DIAGNOSIS — I10 Essential (primary) hypertension: Secondary | ICD-10-CM

## 2023-02-21 DIAGNOSIS — I2510 Atherosclerotic heart disease of native coronary artery without angina pectoris: Secondary | ICD-10-CM

## 2023-02-21 DIAGNOSIS — I4719 Other supraventricular tachycardia: Secondary | ICD-10-CM

## 2023-03-20 NOTE — Progress Notes (Deleted)
Cardiology Office Note    Patient Name: Dalton [name] Date of Encounter: 03/20/2023  Primary Care Provider:  Fleet Contras, MD Primary Cardiologist:  None Primary Electrophysiologist: Nobie Putnam, MD   Past Medical History    Past Medical History:  Diagnosis Date   (HFpEF) heart failure with preserved ejection fraction (HCC)    Anemia    Anxiety    Asthma    Bipolar disorder (HCC)    Chronic back pain    Pain Clinic in [place]   Chronic bronchitis    COPD (chronic obstructive pulmonary disease) (HCC)    Coronary artery disease 2002   CABG (LIMA to LAD)   Depression    DVT (deep venous thrombosis) (HCC) ~ 2005   LLE   Frequency of urination    GERD (gastroesophageal reflux disease)    Headache(784.0)    History of seizures    Last 2011   HNP (herniated nucleus pulposus), cervical    Hyperlipidemia    Hypertension    MI (myocardial infarction) (HCC)    Neuropathy    NSAID-induced gastric ulcer    Rheumatoid arthritis (HCC)    Tonsillitis, chronic    Dr. [name] in [place]   Type II diabetes mellitus [facility])    Wears glasses     History of Present Illness  Dalton [name] is a 63 y.o. male with PMH of CAD s/p CABG x 1 in 2002 (LIMA to LAD), HFpEF, HTN, HLD, DM II, rheumatoid arthritis, DVT, left subclavian stent, bipolar disorder, peptic ulcer disease, left subclavian stent, past cocaine abuse who presents today for 16-month follow-up.  Dalton [name] was last seen on 11/26/22 and reported episodes of fatigue and chest pain with shortness of breath and sweating.  He noted breakthrough discomfort with nitroglycerin therapy.  I discussed his symptoms with DOD Dr. Lalla Brothers who recommended left heart cath for further evaluation.  He underwent LHC that showed a atretic LIMA to LAD otherwise normal coronary arteries with normal LV systolic function.  He was seen in the ED on 10/31 with chest discomfort and found to have URI and started on antibiotics.  He was seen  by [name], NP on 01/04/2023 for EP follow-up.  He wore a ZIO monitor that showed reduced events Toprol was adjusted to 100 in the a.m. and 50 in p.m. with plan to discuss flecainide or sotalol at next visit if symptoms persist.  During today's visit the patient reports*** .  Patient denies chest pain, palpitations, dyspnea, PND, orthopnea, nausea, vomiting, dizziness, syncope, edema, weight gain, or early satiety.  ***Notes: -Last ischemic evaluation: -Last echo: -Interim ED visits: Review of Systems  Please see the history of present illness.    All other systems reviewed and are otherwise negative except as noted above.  Physical Exam    Wt Readings from Last 3 Encounters:  01/04/23 261 lb (118.4 kg)  12/23/22 235 lb (106.6 kg)  12/01/22 250 lb (113.4 kg)   ZO:XWRUE were no vitals filed for this visit.,There is no height or weight on file to calculate BMI. GEN: Well nourished, well developed in no acute distress Neck: No JVD; No carotid bruits Pulmonary: Clear to auscultation without rales, wheezing or rhonchi  Cardiovascular: Normal rate. Regular rhythm. Normal S1. Normal S2.   Murmurs: There is no murmur.  ABDOMEN: Soft, non-tender, non-distended EXTREMITIES:  No edema; No deformity   EKG/LABS/ Recent Cardiac Studies   ECG personally reviewed by me today - ***  Risk Assessment/Calculations:   {  Does this patient have ATRIAL FIBRILLATION?:[phone]}  STOP-Bang Score:     { Consider Dx Sleep Disordered Breathing or Sleep Apnea  ICD G47.33          :1}    Lab Results  Component Value Date   WBC 6.4 12/23/2022   HGB 12.9 (L) 12/23/2022   HCT 37.7 (L) 12/23/2022   MCV 85.3 12/23/2022   PLT 221 12/23/2022   Lab Results  Component Value Date   CREATININE 0.96 12/23/2022   BUN 6 (L) 12/23/2022   NA 136 12/23/2022   K 3.7 12/23/2022   CL 99 12/23/2022   CO2 27 12/23/2022   Lab Results  Component Value Date   CHOL 70 05/20/2022   HDL 37 (L) 05/20/2022    LDLCALC 17 05/20/2022   TRIG 82 05/20/2022   CHOLHDL 1.9 05/20/2022    Lab Results  Component Value Date   HGBA1C 5.5 08/21/2022   Assessment & Plan    1.Coronary artery disease: -History of CABG x 1 in 2002 with Lexiscan Myoview completed 05/2021 with no evidence of ischemia and LHC performed showing a atretic LIMA but otherwise normal coronaries  2.Essential hypertension: -Patient's blood pressure is   3.History of  Atrial Tachycardia:   4.DM type II: -Patient's last hemoglobin A1c was 5.5 -Continue current treatment plan per PCP      Disposition: Follow-up with None or APP in *** months {Are you ordering a CV Procedure (e.g. stress test, cath, DCCV, TEE, etc)?   Press F2        :[id number]}   Signed, Napoleon Form, Leodis Rains, NP 03/20/2023, 2:07 PM [facility]

## 2023-03-21 ENCOUNTER — Ambulatory Visit: Payer: 59 | Attending: Nurse Practitioner | Admitting: Nurse Practitioner

## 2023-03-21 DIAGNOSIS — I2510 Atherosclerotic heart disease of native coronary artery without angina pectoris: Secondary | ICD-10-CM

## 2023-03-21 DIAGNOSIS — I10 Essential (primary) hypertension: Secondary | ICD-10-CM

## 2023-03-21 DIAGNOSIS — E1159 Type 2 diabetes mellitus with other circulatory complications: Secondary | ICD-10-CM

## 2023-03-21 DIAGNOSIS — I4719 Other supraventricular tachycardia: Secondary | ICD-10-CM

## 2023-06-13 NOTE — Progress Notes (Deleted)
 Cardiology Office Note    Patient Name: Dalton [name] Date of Encounter: 06/13/2023  Primary Care Provider:  Charle Congo, MD Primary Cardiologist:  None Primary Electrophysiologist: Ardeen Kohler, MD   Past Medical History    Past Medical History:  Diagnosis Date   (HFpEF) heart failure with preserved ejection fraction (HCC)    Anemia    Anxiety    Asthma    Bipolar disorder (HCC)    Chronic back pain    Pain Clinic in [place]   Chronic bronchitis    COPD (chronic obstructive pulmonary disease) (HCC)    Coronary artery disease 2002   CABG (LIMA to LAD)   Depression    DVT (deep venous thrombosis) (HCC) ~ 2005   LLE   Frequency of urination    GERD (gastroesophageal reflux disease)    Headache(784.0)    History of seizures    Last 2011   HNP (herniated nucleus pulposus), cervical    Hyperlipidemia    Hypertension    MI (myocardial infarction) (HCC)    Neuropathy    NSAID-induced gastric ulcer    Rheumatoid arthritis (HCC)    Tonsillitis, chronic    Dr. Olin Bertin in [place]   Type II diabetes mellitus [facility])    Wears glasses     History of Present Illness  Dalton [name] is a 63 y.o. male with PMH of CAD s/p CABG x 1 in 2002 (LIMA to LAD), HFpEF, HTN, HLD, DM II, rheumatoid arthritis, DVT, left subclavian stent, bipolar disorder, peptic ulcer disease, left subclavian stent, past cocaine abuse who presents today for 14-month follow-up.   Mr. Avetisyan was last seen on 11/26/22 and reported episodes of fatigue and chest pain with shortness of breath and sweating.  He noted breakthrough discomfort with nitroglycerin  therapy.  I discussed his symptoms with DOD Dr. Marven Slimmer who recommended left heart cath for further evaluation.  He underwent LHC that showed a atretic LIMA to LAD otherwise normal coronary arteries with normal LV systolic function.  He was seen in the ED on 10/31 with chest discomfort and found to have URI and started on antibiotics.  He was seen  by Creighton Doffing, NP on 01/04/2023 for EP follow-up.  He wore a ZIO monitor that showed reduced events Toprol  was adjusted to 100 in the a.m. and 50 in p.m. with plan to discuss flecainide or sotalol at next visit if symptoms persist.   Patient denies chest pain, palpitations, dyspnea, PND, orthopnea, nausea, vomiting, dizziness, syncope, edema, weight gain, or early satiety.   Discussed the use of AI scribe software for clinical note transcription with the patient, who gave verbal consent to proceed.  History of Present Illness    ***Notes: -Last ischemic evaluation:  Review of Systems  Please see the history of present illness.    All other systems reviewed and are otherwise negative except as noted above.  Physical Exam    Wt Readings from Last 3 Encounters:  01/04/23 261 lb (118.4 kg)  12/23/22 235 lb (106.6 kg)  12/01/22 250 lb (113.4 kg)   NW:GNFAO were no vitals filed for this visit.,There is no height or weight on file to calculate BMI. GEN: Well nourished, well developed in no acute distress Neck: No JVD; No carotid bruits Pulmonary: Clear to auscultation without rales, wheezing or rhonchi  Cardiovascular: Normal rate. Regular rhythm. Normal S1. Normal S2.   Murmurs: There is no murmur.  ABDOMEN: Soft, non-tender, non-distended EXTREMITIES:  No edema; No deformity  EKG/LABS/ Recent Cardiac Studies   ECG personally reviewed by me today - ***  Risk Assessment/Calculations:   {Does this patient have ATRIAL FIBRILLATION?:[phone]}  STOP-Bang Score:     { Consider Dx Sleep Disordered Breathing or Sleep Apnea  ICD G47.33          :1}    Lab Results  Component Value Date   WBC 6.4 12/23/2022   HGB 12.9 (L) 12/23/2022   HCT 37.7 (L) 12/23/2022   MCV 85.3 12/23/2022   PLT 221 12/23/2022   Lab Results  Component Value Date   CREATININE 0.96 12/23/2022   BUN 6 (L) 12/23/2022   NA 136 12/23/2022   K 3.7 12/23/2022   CL 99 12/23/2022   CO2 27 12/23/2022    Lab Results  Component Value Date   CHOL 70 05/20/2022   HDL 37 (L) 05/20/2022   LDLCALC 17 05/20/2022   TRIG 82 05/20/2022   CHOLHDL 1.9 05/20/2022    Lab Results  Component Value Date   HGBA1C 5.5 08/21/2022   Assessment & Plan    1.Coronary artery disease: -History of CABG x 1 in 2002 with Lexiscan  Myoview  completed 05/2021 with no evidence of ischemia and LHC performed showing a atretic LIMA but otherwise normal coronaries   2.Essential hypertension: -Patient's blood pressure is    3.History of  Atrial Tachycardia:    4.DM type II: -Patient's last hemoglobin A1c was 5.5 -Continue current treatment plan per PCP      Disposition: Follow-up with None or APP in *** months {Are you ordering a CV Procedure (e.g. stress test, cath, DCCV, TEE, etc)?   Press F2        :[id number]}   Signed, Francene Ing, Retha Cast, NP 06/13/2023, 7:20 PM [facility]

## 2023-06-14 ENCOUNTER — Ambulatory Visit: Payer: 59 | Attending: Nurse Practitioner | Admitting: Nurse Practitioner

## 2023-06-14 DIAGNOSIS — E1159 Type 2 diabetes mellitus with other circulatory complications: Secondary | ICD-10-CM

## 2023-06-14 DIAGNOSIS — I10 Essential (primary) hypertension: Secondary | ICD-10-CM

## 2023-06-14 DIAGNOSIS — I2510 Atherosclerotic heart disease of native coronary artery without angina pectoris: Secondary | ICD-10-CM

## 2023-06-14 DIAGNOSIS — I4719 Other supraventricular tachycardia: Secondary | ICD-10-CM

## 2023-08-30 NOTE — Progress Notes (Unsigned)
 Cardiology Office Note    Patient Name: Dalton [name] Date of Encounter: 08/30/2023  Primary Care Provider:  Shelda Atlas, MD Primary Cardiologist:  None Primary Electrophysiologist: Fonda Kitty, MD   Past Medical History    Past Medical History:  Diagnosis Date   (HFpEF) heart failure with preserved ejection fraction (HCC)    Anemia    Anxiety    Asthma    Bipolar disorder (HCC)    Chronic back pain    Pain Clinic in [place]   Chronic bronchitis    COPD (chronic obstructive pulmonary disease) (HCC)    Coronary artery disease 2002   CABG (LIMA to LAD)   Depression    DVT (deep venous thrombosis) (HCC) ~ 2005   LLE   Frequency of urination    GERD (gastroesophageal reflux disease)    Headache(784.0)    History of seizures    Last 2011   HNP (herniated nucleus pulposus), cervical    Hyperlipidemia    Hypertension    MI (myocardial infarction) (HCC)    Neuropathy    NSAID-induced gastric ulcer    Rheumatoid arthritis (HCC)    Tonsillitis, chronic    Dr. Elspeth Endow in [place]   Type II diabetes mellitus [facility])    Wears glasses     History of Present Illness  Dalton [name] is a 63 y.o. male with PMH of CAD s/p CABG x 1 in 2002 (LIMA to LAD), HFpEF, HTN, HLD, DM II, rheumatoid arthritis, DVT, left subclavian stent, bipolar disorder, peptic ulcer disease, left subclavian stent, past cocaine abuse who presents today for 38-month follow-up   Dalton [name] was last seen on 11/26/22 and reported episodes of fatigue and chest pain with shortness of breath and sweating.  He noted breakthrough discomfort with nitroglycerin  therapy.  I discussed his symptoms with DOD Dr. Cindie who recommended left heart cath for further evaluation.  He underwent LHC that showed a atretic LIMA to LAD otherwise normal coronary arteries with normal LV systolic function.  He was seen in the ED on 10/31 with chest discomfort and found to have URI and started on antibiotics.  He was seen by  Daphne Barrack, NP on 01/04/2023 for EP follow-up.  He wore a ZIO monitor that showed reduced events Toprol  xl was adjusted to 100 in the a.m. and 50 in p.m. with plan to discuss flecainide or sotalol at next visit if symptoms persist.  Dalton [name] presents today for 73-month follow-up with his son. He experiences chest pain described as a 'burning sensation' and 'starving' feeling, similar to what he felt before previous heart surgeries. The pain often occurs after physical activities or meals and is somewhat relieved by drinking cold ice water  and resting. He has not been using nitroglycerin  recently as he has run out of it. He experiences shortness of breath, particularly after exertion. 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 He has not been able to engage in regular high-intensity exercise despite having a gym membership. He has a history of gastrointestinal issues, including abdominal pain and esophagitis, which were evaluated during a hospital stay last summer. He uses Tums and Pepto Bismol for relief but finds them only somewhat effective. He was previously on Carafate but is unsure if he is still taking it. He mentions experiencing painful knots on his head, which bleed and cause him discomfort. He is concerned about associated forgetfulness, such as misplacing items, but denies  forgetting addresses or locations. He has not seen a neurologist or dermatologist for these issues. Patient denies palpitations, dyspnea, PND, orthopnea, nausea, vomiting, dizziness, syncope, edema, weight gain, or early satiety.   Discussed the use of AI scribe software for clinical note transcription with the patient, who gave verbal consent to proceed.  History of Present Illness   Review of Systems  Please see the history of present illness.    All other systems reviewed and are otherwise negative except as noted  above.  Physical Exam    Wt Readings from Last 3 Encounters:  01/04/23 261 lb (118.4 kg)  12/23/22 235 lb (106.6 kg)  12/01/22 250 lb (113.4 kg)   CD:Uyzmz were no vitals filed for this visit.,There is no height or weight on file to calculate BMI. GEN: Well nourished, well developed in no acute distress Neck: No JVD; No carotid bruits Pulmonary: Clear to auscultation without rales, wheezing or rhonchi  Cardiovascular: Normal rate. Regular rhythm. Normal S1. Normal S2.   Murmurs: There is no murmur.  ABDOMEN: Soft, non-tender, non-distended EXTREMITIES:  No edema; No deformity   EKG/LABS/ Recent Cardiac Studies   ECG personally reviewed by me today -sinus rhythm with PACs rate of 86 bpm with no acute changes.  Risk Assessment/Calculations:      STOP-Bang Score:         Lab Results  Component Value Date   WBC 6.4 12/23/2022   HGB 12.9 (L) 12/23/2022   HCT 37.7 (L) 12/23/2022   MCV 85.3 12/23/2022   PLT 221 12/23/2022   Lab Results  Component Value Date   CREATININE 0.96 12/23/2022   BUN 6 (L) 12/23/2022   NA 136 12/23/2022   K 3.7 12/23/2022   CL 99 12/23/2022   CO2 27 12/23/2022   Lab Results  Component Value Date   CHOL 70 05/20/2022   HDL 37 (L) 05/20/2022   LDLCALC 17 05/20/2022   TRIG 82 05/20/2022   CHOLHDL 1.9 05/20/2022    Lab Results  Component Value Date   HGBA1C 5.5 08/21/2022   Assessment & Plan    Assessment & Plan  1.Coronary artery disease: -History of CABG x 1 in 2002 with Lexiscan  Myoview  completed 05/2021 with no evidence of ischemia and LHC performed showing a atretic LIMA but otherwise normal coronaries -Patient reports intermittent chest pain with exertional activity but has not used as needed nitroglycerin  due to running out of prescription - Increase isosorbide  to 90 mg daily. - Refill nitroglycerin  prescription at [facility]. - Consider amlodipine  if pain persists post-isosorbide  increase. - Continue GDMT with Lipitor  80 mg, Repatha   140 mg, Zetia  10 mg, Toprol -XL 50 mg twice daily,.  Nitrostat  0.4 mg   2.Essential hypertension: -Patient's blood pressure is controlled at 126/77 -Continue Zestoretic  10-25 mg daily, Toprol -XL 50 mg daily   3.History of  Atrial Tachycardia:  -EKG today shows atrial tachycardia but patient reports no episodes of sustained arrhythmia. -Continue Toprol -XL 25 mg   4.DM type II: -Patient's last hemoglobin A1c was 5.5 -Continue current treatment plan per PCP  5.  Hyperlipidemia: - Continue current treatment plan with Lipitor  80 mg, Repatha  140 mg, ezetimibe  10 mg  Disposition: Follow-up with Dr. Floretta or APP in 6 months    Signed, Wyn Raddle, Jackee Shove, NP 08/30/2023, 8:22 PM [facility]

## 2023-08-31 ENCOUNTER — Ambulatory Visit: Attending: Cardiovascular Disease | Admitting: Nurse Practitioner

## 2023-08-31 ENCOUNTER — Encounter: Payer: Self-pay | Admitting: Nurse Practitioner

## 2023-08-31 VITALS — BP 126/77 | HR 86 | Ht 70.0 in | Wt 252.2 lb

## 2023-08-31 DIAGNOSIS — E785 Hyperlipidemia, unspecified: Secondary | ICD-10-CM

## 2023-08-31 DIAGNOSIS — I2510 Atherosclerotic heart disease of native coronary artery without angina pectoris: Secondary | ICD-10-CM

## 2023-08-31 DIAGNOSIS — I10 Essential (primary) hypertension: Secondary | ICD-10-CM | POA: Diagnosis not present

## 2023-08-31 DIAGNOSIS — I4719 Other supraventricular tachycardia: Secondary | ICD-10-CM | POA: Diagnosis not present

## 2023-08-31 DIAGNOSIS — E1159 Type 2 diabetes mellitus with other circulatory complications: Secondary | ICD-10-CM | POA: Diagnosis not present

## 2023-08-31 MED ORDER — ISOSORBIDE MONONITRATE ER 60 MG PO TB24: 90.0000 mg | ORAL_TABLET | Freq: Every day | ORAL | 1 refills | Status: DC

## 2023-08-31 MED ORDER — CLOPIDOGREL BISULFATE 75 MG PO TABS: 75.0000 mg | ORAL_TABLET | Freq: Every day | ORAL | 1 refills | Status: DC

## 2023-08-31 MED ORDER — FUROSEMIDE 20 MG PO TABS: 20.0000 mg | ORAL_TABLET | Freq: Two times a day (BID) | ORAL | 3 refills | Status: DC

## 2023-08-31 MED ORDER — EZETIMIBE 10 MG PO TABS: 10.0000 mg | ORAL_TABLET | Freq: Every day | ORAL | 1 refills | Status: DC

## 2023-08-31 MED ORDER — LISINOPRIL-HYDROCHLOROTHIAZIDE 10-12.5 MG PO TABS: 1.0000 | ORAL_TABLET | Freq: Every day | ORAL | 1 refills | Status: DC

## 2023-08-31 NOTE — Patient Instructions (Signed)
 Medication Instructions:  INCREASE Imdur  to 90mg  Take 1.5 tablets ocnce a day TAKE nitroglycerin  for chest pain  *If you need a refill on your cardiac medications before your next appointment, please call your pharmacy*  Lab Work: None ordered If you have labs (blood work) drawn today and your tests are completely normal, you will receive your results only by: MyChart Message (if you have MyChart) OR A paper copy in the mail If you have any lab test that is abnormal or we need to change your treatment, we will call you to review the results.  Testing/Procedures: None ordered  Follow-Up: At [facility], you and your health needs are our priority.  As part of our continuing mission to provide you with exceptional heart care, our providers are all part of one team.  This team includes your primary Cardiologist (physician) and Advanced Practice Providers or APPs (Physician Assistants and Nurse Practitioners) who all work together to provide you with the care you need, when you need it.  Your next appointment:   6 month(s)  Provider:   Georganna Archer, MD    We recommend signing up for the patient portal called MyChart.  Sign up information is provided on this After Visit Summary.  MyChart is used to connect with patients for Virtual Visits (Telemedicine).  Patients are able to view lab/test results, encounter notes, upcoming appointments, etc.  Non-urgent messages can be sent to your provider as well.   To learn more about what you can do with MyChart, go to [email or web address].   Other Instructions CONTACT YOUR PRIMARY CARE PROVIDER FOR A REFERRAL FOR NEUROLOGY AND DERMATOLOGY

## 2023-10-31 ENCOUNTER — Emergency Department (HOSPITAL_COMMUNITY)

## 2023-10-31 ENCOUNTER — Encounter (HOSPITAL_COMMUNITY): Payer: Self-pay

## 2023-10-31 ENCOUNTER — Emergency Department (HOSPITAL_COMMUNITY)
Admission: EM | Admit: 2023-10-31 | Discharge: 2023-10-31 | Disposition: A | Discharge status: Home / Self Care | Attending: Emergency Medicine | Admitting: Emergency Medicine

## 2023-10-31 ENCOUNTER — Other Ambulatory Visit: Payer: Self-pay

## 2023-10-31 DIAGNOSIS — R0981 Nasal congestion: Secondary | ICD-10-CM | POA: Diagnosis not present

## 2023-10-31 DIAGNOSIS — R059 Cough, unspecified: Secondary | ICD-10-CM | POA: Insufficient documentation

## 2023-10-31 DIAGNOSIS — R112 Nausea with vomiting, unspecified: Secondary | ICD-10-CM | POA: Diagnosis not present

## 2023-10-31 DIAGNOSIS — R0602 Shortness of breath: Secondary | ICD-10-CM | POA: Diagnosis not present

## 2023-10-31 DIAGNOSIS — E876 Hypokalemia: Secondary | ICD-10-CM | POA: Insufficient documentation

## 2023-10-31 DIAGNOSIS — Z7902 Long term (current) use of antithrombotics/antiplatelets: Secondary | ICD-10-CM | POA: Insufficient documentation

## 2023-10-31 DIAGNOSIS — I2510 Atherosclerotic heart disease of native coronary artery without angina pectoris: Secondary | ICD-10-CM | POA: Insufficient documentation

## 2023-10-31 DIAGNOSIS — Z951 Presence of aortocoronary bypass graft: Secondary | ICD-10-CM | POA: Diagnosis not present

## 2023-10-31 DIAGNOSIS — R0789 Other chest pain: Secondary | ICD-10-CM | POA: Insufficient documentation

## 2023-10-31 LAB — COMPREHENSIVE METABOLIC PANEL WITH GFR
ALT: 13 U/L (ref 0–44)
AST: 17 U/L (ref 15–41)
Albumin: 4.2 g/dL (ref 3.5–5.0)
Alkaline Phosphatase: 61 U/L (ref 38–126)
Anion gap: 13 (ref 5–15)
BUN: 12 mg/dL (ref 8–23)
CO2: 28 mmol/L (ref 22–32)
Calcium: 9.9 mg/dL (ref 8.9–10.3)
Chloride: 96 mmol/L — ABNORMAL LOW (ref 98–111)
Creatinine, Ser: 1.14 mg/dL (ref 0.61–1.24)
GFR, Estimated: >60 mL/min (ref >60)
Glucose, Bld: 90 mg/dL (ref 70–99)
Potassium: 2.7 mmol/L — CL (ref 3.5–5.1)
Sodium: 137 mmol/L (ref 135–145)
Total Bilirubin: 1.5 mg/dL — ABNORMAL HIGH (ref 0.0–1.2)
Total Protein: 7.6 g/dL (ref 6.5–8.1)

## 2023-10-31 LAB — CBC WITH DIFFERENTIAL/PLATELET
Abs Immature Granulocytes: 0.01 K/uL (ref 0.00–0.07)
Basophils Absolute: 0 K/uL (ref 0.0–0.1)
Basophils Relative: 0 %
Eosinophils Absolute: 0.1 K/uL (ref 0.0–0.5)
Eosinophils Relative: 2 %
HCT: 35.1 % — ABNORMAL LOW (ref 39.0–52.0)
Hemoglobin: 12.3 g/dL — ABNORMAL LOW (ref 13.0–17.0)
Immature Granulocytes: 0 %
Lymphocytes Relative: 37 %
Lymphs Abs: 1.8 K/uL (ref 0.7–4.0)
MCH: 30.1 pg (ref 26.0–34.0)
MCHC: 35 g/dL (ref 30.0–36.0)
MCV: 85.8 fL (ref 80.0–100.0)
Monocytes Absolute: 0.3 K/uL (ref 0.1–1.0)
Monocytes Relative: 6 %
Neutro Abs: 2.6 K/uL (ref 1.7–7.7)
Neutrophils Relative %: 55 %
Platelets: 228 K/uL (ref 150–400)
RBC: 4.09 MIL/uL — ABNORMAL LOW (ref 4.22–5.81)
RDW: 13.8 % (ref 11.5–15.5)
WBC: 4.8 K/uL (ref 4.0–10.5)
nRBC: 0 % (ref 0.0–0.2)

## 2023-10-31 LAB — BRAIN NATRIURETIC PEPTIDE: B Natriuretic Peptide: 19 pg/mL (ref 0.0–100.0)

## 2023-10-31 LAB — MAGNESIUM: Magnesium: 2 mg/dL (ref 1.7–2.4)

## 2023-10-31 LAB — RESP PANEL BY RT-PCR (RSV, FLU A&B, COVID)  RVPGX2
Influenza A by PCR: NEGATIVE
Influenza B by PCR: NEGATIVE
Resp Syncytial Virus by PCR: NEGATIVE
SARS Coronavirus 2 by RT PCR: NEGATIVE

## 2023-10-31 LAB — TROPONIN I (HIGH SENSITIVITY)
Troponin I (High Sensitivity): 3 ng/L (ref <18)
Troponin I (High Sensitivity): 3 ng/L (ref <18)

## 2023-10-31 LAB — LIPASE, BLOOD: Lipase: 32 U/L (ref 11–51)

## 2023-10-31 MED ORDER — POTASSIUM CHLORIDE CRYS ER 20 MEQ PO TBCR
40.0000 meq | EXTENDED_RELEASE_TABLET | Freq: Once | ORAL | Status: AC
  Administered 2023-10-31: 40 meq via ORAL
  Filled 2023-10-31: qty 2

## 2023-10-31 MED ORDER — POTASSIUM CHLORIDE ER 10 MEQ PO TBCR: 10.0000 meq | EXTENDED_RELEASE_TABLET | Freq: Every day | ORAL | 0 refills | Status: AC

## 2023-10-31 MED ORDER — IOHEXOL 350 MG/ML SOLN
75.0000 mL | Freq: Once | INTRAVENOUS | Status: AC | PRN
  Administered 2023-10-31: 75 mL via INTRAVENOUS

## 2023-10-31 MED ORDER — POTASSIUM CHLORIDE 10 MEQ/100ML IV SOLN
10.0000 meq | INTRAVENOUS | Status: AC
  Administered 2023-10-31 (×2): 10 meq via INTRAVENOUS
  Filled 2023-10-31 (×2): qty 100

## 2023-10-31 NOTE — Discharge Instructions (Signed)
 Please follow-up closely with your cardiologist on an outpatient basis.  Please follow-up with your primary care doctor for a recheck of your potassium within the next week.  Return to emergency department immediately for any new or worsening symptoms.

## 2023-10-31 NOTE — ED Notes (Addendum)
 Patient transported to x-ray. ?

## 2023-10-31 NOTE — ED Notes (Signed)
 Patient ambulated 120 ft around nurses station with assistance of walker. Pulse 84-82 and oxygen  jumped around from 96 to 94% then up to 100% room air. Patient had not complaints while ambulating. Patient let this nurse know he did get winded upon sitting from walking. Oxygen  was reading 100% Room air and pulse of 86 at end of ambulation. Provider made aware.

## 2023-10-31 NOTE — ED Notes (Signed)
 ED Provider at bedside.

## 2023-10-31 NOTE — ED Notes (Signed)
 Patient transported to CT

## 2023-10-31 NOTE — ED Triage Notes (Signed)
 Pt arrived via POV c/o chest pain X 3 days. Pt reports SOB, lightheadedness, N/V and reports Hx of heart problems.

## 2023-10-31 NOTE — ED Provider Notes (Signed)
 [place] EMERGENCY DEPARTMENT AT [facility] Provider Note   CSN: [id number] Arrival date & time: 10/31/23  1120     Patient presents with: Chest Pain   Dalton [name] is a 63 y.o. male.   Patient is a 63 year old male with a past medical history of CAD and previous CABG who presents emergency department the chief complaint of chest pain, shortness of breath, intermittent lightheadedness, nausea, vomiting, cough and congestion which has been ongoing for approximate the past 3 weeks.  Patient notes that he has been taking his home breathing treatments with no improvement.  Patient notes that he has had no increase swelling in his lower extremities.  He denies any direct abdominal pain, diarrhea, constipation.  He has had no associated fever or chills.  He denies any associated syncopal events.  He does note that symptoms seem to be worse with exertion.   Chest Pain      Prior to Admission medications   Medication Sig Start Date End Date Taking? Authorizing Provider  albuterol  (PROVENTIL ) (2.5 MG/3ML) 0.083% nebulizer solution Take 3 mLs (2.5 mg total) by nebulization every 6 (six) hours as needed for wheezing or shortness of breath. 07/08/22   Cleotilde Rogue, MD  albuterol  (VENTOLIN  HFA) 108 [phone] Base) MCG/ACT inhaler Inhale 2 puffs into the lungs every 4 (four) hours as needed for wheezing or shortness of breath. 07/08/22   Cleotilde Rogue, MD  ALPRAZolam  (XANAX ) 1 MG tablet Take 2 mg by mouth 2 (two) times daily as needed for anxiety. 08/01/19   [provider]  atorvastatin  (LIPITOR ) 80 MG tablet Take 80 mg by mouth at bedtime. 12/05/20   [provider]  azelastine (ASTELIN) 0.1 % nasal spray Place 1 spray into both nostrils 2 (two) times daily. 12/29/21   [provider]  beclomethasone (QVAR ) 80 MCG/ACT inhaler Inhale 2 puffs into the lungs 2 (two) times daily as needed (respiratory issues.).     [provider]  BELSOMRA 15 MG TABS Take 1  tablet by mouth at bedtime. 04/30/21   [provider]  brimonidine (ALPHAGAN P) 0.1 % SOLN Place 1 drop into the right eye 2 (two) times daily. 12/25/20   [provider]  cetirizine  (ZYRTEC ) 10 MG tablet Take 10 mg by mouth daily as needed for allergies.  01/26/16   [provider]  clopidogrel  (PLAVIX ) 75 MG tablet Take 1 tablet (75 mg total) by mouth daily. 08/31/23   Wyn Jackee VEAR Mickey., NP  dexlansoprazole  (DEXILANT ) 60 MG capsule Take 1 capsule (60 mg total) by mouth daily. 07/05/17   Fields, Margo CROME, MD  esomeprazole  (NEXIUM ) 40 MG capsule Take 40 mg by mouth daily. 07/06/22   [provider]  Evolocumab  (REPATHA  SURECLICK) 140 MG/ML SOAJ Take 140 mg by mouth every 14 (fourteen) days. 03/10/22   Alvan Dorn FALCON, MD  ezetimibe  (ZETIA ) 10 MG tablet Take 1 tablet (10 mg total) by mouth daily. 08/31/23   Wyn Jackee VEAR Mickey., NP  fluticasone  (FLONASE ) 50 MCG/ACT nasal spray Place 2 sprays into both nostrils daily.    [provider]  Fluticasone  Furoate (ARNUITY ELLIPTA) 200 MCG/ACT AEPB Inhale 1 puff into the lungs daily.    [provider]  furosemide  (LASIX ) 20 MG tablet Take 1 tablet (20 mg total) by mouth 2 (two) times daily. 08/31/23   Wyn Jackee VEAR Mickey., NP  glipiZIDE  (GLUCOTROL ) 10 MG tablet Take 10 mg by mouth daily before breakfast.    [provider]  isosorbide  mononitrate (IMDUR ) 60 MG 24 hr tablet Take 1.5 tablets (90 mg total) by mouth daily. 08/31/23   Dick, Ernest H Jr., NP  ketoconazole (NIZORAL) 2 % shampoo Apply 1 application topically daily as needed for irritation.  10/23/19   [provider]  latanoprost (XALATAN) 0.005 % ophthalmic solution Place 1 drop into both eyes at bedtime.    [provider]  LINZESS  290 MCG CAPS capsule TAKE 1 CAPSULE(290 MCG) BY MOUTH DAILY BEFORE BREAKFAST 02/04/21   Ezzard Sonny RAMAN, PA-C  lisinopril -hydrochlorothiazide  (ZESTORETIC ) 10-12.5 MG tablet Take 1 tablet by mouth daily.  08/31/23   Wyn Jackee VEAR Mickey., NP  lubiprostone (AMITIZA) 24 MCG capsule Take 24 mcg by mouth 2 (two) times daily. 03/07/23   [provider]  metFORMIN  (GLUCOPHAGE -XR) 500 MG 24 hr tablet Take 1 tablet (500 mg total) by mouth in the morning and at bedtime. 12/04/22   [place], Peter M, MD  methocarbamol  (ROBAXIN ) 500 MG tablet Take by mouth as needed for muscle spasms.    [provider]  metoprolol  succinate (TOPROL -XL) 50 MG 24 hr tablet Take 2 tablets (100mg ) by mouth in the morning. Take 1 tablet (50 mg) by mouth at night. 01/04/23   Aniceto Daphne CROME, NP  MOUNJARO 2.5 MG/0.5ML Pen Inject 2.5 mg into the skin once a week. 08/12/23   [provider]  MOVANTIK  25 MG TABS tablet Take 25 mg by mouth every morning. 07/09/22   [provider]  Multiple Vitamin (MULTIVITAMIN WITH MINERALS) TABS tablet Take 1 tablet by mouth daily.    [provider]  NARCAN  4 MG/0.1ML LIQD nasal spray kit Place 1 spray into the nose as needed (opioid overdose).  06/15/17   [provider]  nitroGLYCERIN  (NITROSTAT ) 0.4 MG SL tablet Place 1 tablet (0.4 mg total) under the tongue every 5 (five) minutes as needed for chest pain. 08/26/21   Johnson, Clanford L, MD  omeprazole  (PRILOSEC) 20 MG capsule Take 20 mg by mouth daily. 07/21/22   [provider]  oxyCODONE -acetaminophen  (PERCOCET) 10-325 MG tablet Take 1 tablet by mouth every 4 (four) hours as needed for pain.    [provider]  potassium chloride  SA (KLOR-CON ) 20 MEQ tablet Take 20 mEq by mouth 2 (two) times daily.    [provider]  pregabalin  (LYRICA ) 100 MG capsule Take 100 mg by mouth 2 (two) times daily.    [provider]  sertraline  (ZOLOFT ) 100 MG tablet Take 100 mg by mouth daily.    [provider]  sildenafil (VIAGRA) 100 MG tablet Take by mouth as needed for erectile dysfunction. 03/11/22   [provider]  sucralfate (CARAFATE) 1 g tablet Take 1 g by mouth  3 (three) times daily.    [provider]  tamsulosin  (FLOMAX ) 0.4 MG CAPS capsule Take 1 capsule (0.4 mg total) by mouth daily after breakfast. 01/07/16   Secundino Cones, NP  tiZANidine (ZANAFLEX) 4 MG tablet Take 4 mg by mouth 2 (two) times daily as needed. 06/15/23   [provider]  Vitamin D , Ergocalciferol , (DRISDOL ) 1.25 MG (50000 UT) CAPS capsule Take 50,000 Units by mouth every 7 (seven) days. Monday    [provider]    Allergies: Tirzepatide, Gabapentin, Ibuprofen , Zolpidem tartrate, and Naproxen    Review of Systems  Cardiovascular:  Positive for chest pain.  All other systems reviewed and are negative.   Updated Vital Signs BP 109/77   Pulse 79   Temp 97.8 F (  36.6 C) (Oral)   Resp 17   Ht 5' 10 (1.778 m)   Wt 80.7 kg   SpO2 94%   BMI 25.54 kg/m   Physical Exam Vitals reviewed.  Constitutional:      General: He is not in acute distress.    Appearance: Normal appearance. He is well-developed. He is not ill-appearing.  HENT:     Head: Normocephalic and atraumatic.     Nose: Nose normal.     Mouth/Throat:     Mouth: Mucous membranes are moist.  Eyes:     Extraocular Movements: Extraocular movements intact.     Conjunctiva/sclera: Conjunctivae normal.     Pupils: Pupils are equal, round, and reactive to light.  Cardiovascular:     Rate and Rhythm: Normal rate and regular rhythm.     Pulses: Normal pulses.     Heart sounds: Normal heart sounds. Heart sounds not distant. No murmur heard. Pulmonary:     Effort: Pulmonary effort is normal. No tachypnea or respiratory distress.     Breath sounds: Normal breath sounds. No stridor. No decreased breath sounds, wheezing, rhonchi or rales.  Chest:     Chest wall: No tenderness.  Abdominal:     General: Abdomen is flat. Bowel sounds are normal.     Palpations: Abdomen is soft. There is no mass.     Tenderness: There is no abdominal tenderness. There is no guarding.  Musculoskeletal:         General: Normal range of motion.     Cervical back: Normal range of motion and neck supple.     Right lower leg: No edema.     Left lower leg: No edema.  Skin:    General: Skin is warm and dry.     Findings: No ecchymosis, erythema or rash.  Neurological:     General: No focal deficit present.     Mental Status: He is alert and oriented to person, place, and time. Mental status is at baseline.  Psychiatric:        Mood and Affect: Mood normal.        Behavior: Behavior normal.        Thought Content: Thought content normal.        Judgment: Judgment normal.     (all labs ordered are listed, but only abnormal results are displayed) Labs Reviewed  CBC WITH DIFFERENTIAL/PLATELET - Abnormal; Notable for the following components:      Result Value   RBC 4.09 (*)    Hemoglobin 12.3 (*)    HCT 35.1 (*)    All other components within normal limits  RESP PANEL BY RT-PCR (RSV, FLU A&B, COVID)  RVPGX2  BRAIN NATRIURETIC PEPTIDE  LIPASE, BLOOD  COMPREHENSIVE METABOLIC PANEL WITH GFR  TROPONIN I (HIGH SENSITIVITY)    EKG: None  Radiology: DG Chest 2 View Result Date: 10/31/2023 EXAM: 2 VIEW(S) XRAY OF THE CHEST 10/31/2023 12:02:00 PM COMPARISON: PA and lateral radiographs of the chest dated 12/22/2000. CLINICAL HISTORY: Chest pain. Per triage: Pt arrived via POV c/o chest pain X 3 days. Pt reports SOB, lightheadedness, N/V and reports Hx of heart problems. FINDINGS: LUNGS AND PLEURA: No focal pulmonary opacity. No pulmonary edema. No pleural effusion. No pneumothorax. HEART AND MEDIASTINUM: The heart is borderline in size. The patient is status post sternotomy. There is a left subclavian stent present. BONES AND SOFT TISSUES: No acute osseous abnormality. IMPRESSION: 1. No acute findings. 2. Borderline heart size, status post sternotomy, and left  subclavian stent. Electronically signed by: Evalene Coho MD 10/31/2023 12:33 PM EDT RP Workstation: [id number]     Procedures    Medications Ordered in the ED - No data to display                                  Medical Decision Making Amount and/or Complexity of Data Reviewed Labs: ordered. Radiology: ordered.  Risk Prescription drug management.   This patient presents to the ED for concern of chest pain differential diagnosis includes ACS, pulmonary embolus, pericarditis, myocarditis, endocarditis, aortic aneurysm or dissection, chest wall pain, pneumonia, pneumothorax, hemothorax, costochondritis, electrolyte derangement    Additional history obtained:  Additional history obtained from medical records External records from outside source obtained and reviewed including medical records   Lab Tests:  I Ordered, and personally interpreted labs.  The pertinent results include: No leukocytosis, anemia at baseline, hypokalemia noted, normal kidney function liver function, negative serial troponins, negative BNP, normal magnesium , normal lipase   Imaging Studies ordered:  I ordered imaging studies including chest x-ray, CTA chest I independently visualized and interpreted imaging which showed no pulmonary embolus, no other acute cardiopulmonary process I agree with the radiologist interpretation   Medicines ordered and prescription drug management:  I ordered medication including potassium for hypokalemia Reevaluation of the patient after these medicines showed that the patient improved I have reviewed the patients home medicines and have made adjustments as needed   Problem List / ED Course:  Patient is doing well at this time and is stable for discharge home.  He does remain pain-free at this time.  He was able to ambulate in the emergency department under his own power without difficulty with no desaturations.  CT of the chest demonstrated no indication for pulmonary embolus.  He has no indication for pneumonia, pneumothorax, hemothorax.  Symptoms are not positional in nature and do not suspect  pericarditis or myocarditis.  EKG demonstrates no acute ischemic changes and no indication for STEMI.  He has negative serial troponins.  Do not suspect ACS at this time.  There is no indication for aortic aneurysm or dissection.  Of note patient's last cardiology note approximately month and a half ago noted similar symptoms at that time.  At that point his Imdur  was increased as well as his nitroglycerin  refilled.  He was directed to continue to follow-up closely with his cardiologist on outpatient basis for continued evaluation.  Strict return precautions were discussed for any new or worsening symptoms.  Will continue potassium repletion on outpatient basis.  Patient voiced understand to the plan and had no additional questions. Patient was fully evaluated by attending physician and is in agreement to plan at this time.    Social Determinants of Health:  None        Final diagnoses:  None    ED Discharge Orders     None          Daralene Lonni JONETTA DEVONNA 10/31/23 1516    Dalton Charleston, MD 10/31/23 2245

## 2023-11-17 ENCOUNTER — Other Ambulatory Visit: Payer: Self-pay

## 2023-11-17 ENCOUNTER — Emergency Department (HOSPITAL_COMMUNITY)

## 2023-11-17 ENCOUNTER — Emergency Department (HOSPITAL_COMMUNITY)
Admission: EM | Admit: 2023-11-17 | Discharge: 2023-11-17 | Disposition: A | Discharge status: Home / Self Care | Attending: Emergency Medicine | Admitting: Emergency Medicine

## 2023-11-17 ENCOUNTER — Encounter (HOSPITAL_COMMUNITY): Payer: Self-pay

## 2023-11-17 DIAGNOSIS — S61412A Laceration without foreign body of left hand, initial encounter: Secondary | ICD-10-CM | POA: Insufficient documentation

## 2023-11-17 DIAGNOSIS — W25XXXA Contact with sharp glass, initial encounter: Secondary | ICD-10-CM | POA: Insufficient documentation

## 2023-11-17 DIAGNOSIS — Z7902 Long term (current) use of antithrombotics/antiplatelets: Secondary | ICD-10-CM | POA: Insufficient documentation

## 2023-11-17 DIAGNOSIS — S6992XA Unspecified injury of left wrist, hand and finger(s), initial encounter: Secondary | ICD-10-CM | POA: Diagnosis present

## 2023-11-17 DIAGNOSIS — Z23 Encounter for immunization: Secondary | ICD-10-CM | POA: Diagnosis not present

## 2023-11-17 MED ORDER — POVIDONE-IODINE 10 % EX OINT: TOPICAL_OINTMENT | Freq: Once | CUTANEOUS | Status: DC

## 2023-11-17 MED ORDER — CEPHALEXIN 500 MG PO CAPS
500.0000 mg | ORAL_CAPSULE | Freq: Three times a day (TID) | ORAL | 0 refills | Status: AC
Start: 2023-11-17 — End: 2023-11-24

## 2023-11-17 MED ORDER — LIDOCAINE HCL (PF) 1 % IJ SOLN
30.0000 mL | Freq: Once | INTRAMUSCULAR | Status: AC
Start: 2023-11-17 — End: 2023-11-17
  Administered 2023-11-17: 30 mL
  Filled 2023-11-17: qty 30

## 2023-11-17 MED ORDER — DOUBLE ANTIBIOTIC 500-10000 UNIT/GM EX OINT
TOPICAL_OINTMENT | Freq: Once | CUTANEOUS | Status: AC
Start: 2023-11-17 — End: 2023-11-17
  Filled 2023-11-17: qty 1

## 2023-11-17 MED ORDER — POVIDONE-IODINE 10 % EX SOLN
CUTANEOUS | Status: AC
  Administered 2023-11-17: 1
  Filled 2023-11-17: qty 14.8

## 2023-11-17 MED ORDER — TETANUS-DIPHTH-ACELL PERTUSSIS 5-2.5-18.5 LF-MCG/0.5 IM SUSY
0.5000 mL | PREFILLED_SYRINGE | Freq: Once | INTRAMUSCULAR | Status: AC
Start: 2023-11-17 — End: 2023-11-17
  Administered 2023-11-17: 0.5 mL via INTRAMUSCULAR
  Filled 2023-11-17: qty 0.5

## 2023-11-17 NOTE — Discharge Instructions (Addendum)
 Please take all antibiotics as directed.  Please continue good wound care as discussed at the bedside.  Return to emergency department in 10 days to have sutures removed.  Return to emergency department immediately for any new or worsening symptoms.

## 2023-11-17 NOTE — ED Triage Notes (Signed)
 Pt arrived via POV c/o laceration to left hand at the base of his thumb. Pt was helping throw glass at the scrap yard and lacerated his hand. Cleaning dressing placed in Triage after irrigating the wound.

## 2023-11-17 NOTE — ED Provider Notes (Signed)
 [place] EMERGENCY DEPARTMENT AT [facility] Provider Note   CSN: [id number] Arrival date & time: 11/17/23  1538     Patient presents with: Laceration   Dalton [name] is a 63 y.o. male.   Patient is a 63 year old male who presents emergency department the chief complaint of a laceration to his left hand between the 1st and 2nd digit.  Patient notes that [place] his hand.  Patient is unsure of his last tetanus shot.  He denies any difficulty with range of motion of the 1st or 2nd digits.  He denies any numbness or paresthesias distally.  He denies any foreign body sensation to the wound.  There is no other secondary sites of injury or pain.   Laceration      Prior to Admission medications   Medication Sig Start Date End Date Taking? Authorizing Provider  cephALEXin  (KEFLEX ) 500 MG capsule Take 1 capsule (500 mg total) by mouth 3 (three) times daily for 7 days. 11/17/23 11/24/23 Yes Rudell Marlowe, Lonni BIRCH, PA-C  albuterol  (PROVENTIL ) (2.5 MG/3ML) 0.083% nebulizer solution Take 3 mLs (2.5 mg total) by nebulization every 6 (six) hours as needed for wheezing or shortness of breath. 07/08/22   Cleotilde Rogue, MD  albuterol  (VENTOLIN  HFA) 108 (90 Base) MCG/ACT inhaler Inhale 2 puffs into the lungs every 4 (four) hours as needed for wheezing or shortness of breath. 07/08/22   Cleotilde Rogue, MD  ALPRAZolam  (XANAX ) 1 MG tablet Take 2 mg by mouth 2 (two) times daily as needed for anxiety. 08/01/19   [provider]  atorvastatin  (LIPITOR ) 80 MG tablet Take 80 mg by mouth at bedtime. 12/05/20   [provider]  azelastine (ASTELIN) 0.1 % nasal spray Place 1 spray into both nostrils 2 (two) times daily. 12/29/21   [provider]  beclomethasone (QVAR ) 80 MCG/ACT inhaler Inhale 2 puffs into the lungs 2 (two) times daily as needed (respiratory issues.).     [provider]  BELSOMRA 15 MG TABS Take 1 tablet by mouth  at bedtime. 04/30/21   [provider]  brimonidine (ALPHAGAN P) 0.1 % SOLN Place 1 drop into the right eye 2 (two) times daily. 12/25/20   [provider]  cetirizine  (ZYRTEC ) 10 MG tablet Take 10 mg by mouth daily as needed for allergies.  01/26/16   [provider]  clopidogrel  (PLAVIX ) 75 MG tablet Take 1 tablet (75 mg total) by mouth daily. 08/31/23   Wyn Jackee VEAR Mickey., NP  dexlansoprazole  (DEXILANT ) 60 MG capsule Take 1 capsule (60 mg total) by mouth daily. 07/05/17   Fields, Margo CROME, MD  esomeprazole  (NEXIUM ) 40 MG capsule Take 40 mg by mouth daily. 07/06/22   [provider]  Evolocumab  (REPATHA  SURECLICK) 140 MG/ML SOAJ Take 140 mg by mouth every 14 (fourteen) days. 03/10/22   Alvan Dorn FALCON, MD  ezetimibe  (ZETIA ) 10 MG tablet Take 1 tablet (10 mg total) by mouth daily. 08/31/23   Wyn Jackee VEAR Mickey., NP  fluticasone  (FLONASE ) 50 MCG/ACT nasal spray Place 2 sprays into both nostrils daily.    [provider]  Fluticasone  Furoate (ARNUITY ELLIPTA) 200 MCG/ACT AEPB Inhale 1 puff into the lungs daily.    [provider]  furosemide  (LASIX ) 20 MG tablet Take 1 tablet (20 mg total) by mouth 2 (two) times daily. 08/31/23   Wyn Jackee VEAR Mickey., NP  glipiZIDE  (GLUCOTROL ) 10 MG tablet Take 10 mg  by mouth daily before breakfast.    [provider]  isosorbide  mononitrate (IMDUR ) 60 MG 24 hr tablet Take 1.5 tablets (90 mg total) by mouth daily. 08/31/23   Dick, Ernest H Jr., NP  ketoconazole (NIZORAL) 2 % shampoo Apply 1 application topically daily as needed for irritation.  10/23/19   [provider]  latanoprost (XALATAN) 0.005 % ophthalmic solution Place 1 drop into both eyes at bedtime.    [provider]  LINZESS  290 MCG CAPS capsule TAKE 1 CAPSULE(290 MCG) BY MOUTH DAILY BEFORE BREAKFAST 02/04/21   Ezzard Sonny RAMAN, PA-C  lisinopril -hydrochlorothiazide  (ZESTORETIC ) 10-12.5 MG tablet Take 1 tablet by mouth daily. 08/31/23   Wyn Jackee VEAR Mickey., NP  lubiprostone (AMITIZA) 24 MCG capsule Take 24 mcg by mouth 2 (two) times daily. 03/07/23   [provider]  metFORMIN  (GLUCOPHAGE -XR) 500 MG 24 hr tablet Take 1 tablet (500 mg total) by mouth in the morning and at bedtime. 12/04/22   [place], Peter M, MD  methocarbamol  (ROBAXIN ) 500 MG tablet Take by mouth as needed for muscle spasms.    [provider]  metoprolol  succinate (TOPROL -XL) 50 MG 24 hr tablet Take 2 tablets (100mg ) by mouth in the morning. Take 1 tablet (50 mg) by mouth at night. 01/04/23   Aniceto Daphne CROME, NP  MOUNJARO 2.5 MG/0.5ML Pen Inject 2.5 mg into the skin once a week. 08/12/23   [provider]  MOVANTIK  25 MG TABS tablet Take 25 mg by mouth every morning. 07/09/22   [provider]  Multiple Vitamin (MULTIVITAMIN WITH MINERALS) TABS tablet Take 1 tablet by mouth daily.    [provider]  NARCAN  4 MG/0.1ML LIQD nasal spray kit Place 1 spray into the nose as needed (opioid overdose).  06/15/17   [provider]  nitroGLYCERIN  (NITROSTAT ) 0.4 MG SL tablet Place 1 tablet (0.4 mg total) under the tongue every 5 (five) minutes as needed for chest pain. 08/26/21   Johnson, Clanford L, MD  omeprazole  (PRILOSEC) 20 MG capsule Take 20 mg by mouth daily. 07/21/22   [provider]  oxyCODONE -acetaminophen  (PERCOCET) 10-325 MG tablet Take 1 tablet by mouth every 4 (four) hours as needed for pain.    [provider]  potassium chloride  (KLOR-CON ) 10 MEQ tablet Take 1 tablet (10 mEq total) by mouth daily for 7 days. 10/31/23 11/07/23  Daralene Lonni BIRCH, PA-C  potassium chloride  SA (KLOR-CON ) 20 MEQ tablet Take 20 mEq by mouth 2 (two) times daily.    [provider]  pregabalin  (LYRICA ) 100 MG capsule Take 100 mg by mouth 2 (two) times daily.    [provider]  sertraline  (ZOLOFT ) 100 MG tablet Take 100 mg by mouth daily.    [provider]  sildenafil (VIAGRA) 100 MG tablet Take by  mouth as needed for erectile dysfunction. 03/11/22   [provider]  sucralfate (CARAFATE) 1 g tablet Take 1 g by mouth 3 (three) times daily.    [provider]  tamsulosin  (FLOMAX ) 0.4 MG CAPS capsule Take 1 capsule (0.4 mg total) by mouth daily after breakfast. 01/07/16   Secundino Cones, NP  tiZANidine (ZANAFLEX) 4 MG tablet Take 4 mg by mouth 2 (two) times daily as needed. 06/15/23   [provider]  Vitamin D , Ergocalciferol , (DRISDOL ) 1.25 MG (50000 UT) CAPS capsule Take 50,000 Units by mouth every 7 (seven) days. Monday    [provider]    Allergies: Tirzepatide, Gabapentin, Ibuprofen , Zolpidem tartrate, and  Naproxen    Review of Systems  Skin:        Laceration of left hand  All other systems reviewed and are negative.   Updated Vital Signs BP 128/82 (BP Location: Right Arm)   Pulse 74   Temp 98.5 F (36.9 C) (Oral)   Resp 18   Ht 5' 10 (1.778 m)   Wt 80.7 kg   SpO2 100%   BMI 25.54 kg/m   Physical Exam Vitals and nursing note reviewed.  Constitutional:      General: He is not in acute distress.    Appearance: Normal appearance. He is not ill-appearing.  HENT:     Head: Normocephalic and atraumatic.  Eyes:     Extraocular Movements: Extraocular movements intact.     Conjunctiva/sclera: Conjunctivae normal.     Pupils: Pupils are equal, round, and reactive to light.  Cardiovascular:     Rate and Rhythm: Normal rate and regular rhythm.     Pulses: Normal pulses.  Pulmonary:     Effort: Pulmonary effort is normal. No respiratory distress.  Musculoskeletal:        General: Normal range of motion.     Comments: Full flexion and extension of left 1st and 2nd digit with each joint isolated, cap refill less than 2 seconds distally, no bony tenderness noted throughout, no obvious deformity  Skin:    General: Skin is warm and dry.     Comments: 2 cm V shaped laceration to the webspace between the 1st and 2nd digit of the left hand,  no excessive bleeding, no obvious foreign body noted within the wound and wound evaluated to depth with adequate lighting, no apparent ligament or tendon involvement  Neurological:     General: No focal deficit present.     Mental Status: He is alert and oriented to person, place, and time. Mental status is at baseline.  Psychiatric:        Mood and Affect: Mood normal.        Behavior: Behavior normal.        Thought Content: Thought content normal.        Judgment: Judgment normal.     (all labs ordered are listed, but only abnormal results are displayed) Labs Reviewed - No data to display  EKG: None  Radiology: DG Hand Complete Left Result Date: 11/17/2023 EXAM: 3 VIEW(S) XRAY OF THE LEFT HAND 11/17/2023 04:19:00 PM COMPARISON: None available. CLINICAL HISTORY: laceration. Table formatting from the original note was not included.; Images from the original note were not included.; Per triage; Pt arrived via POV c/o laceration to left hand at the base of his thumb. Pt was helping throw glass at the [place] and lacerated his hand. Cleaning dressing placed in Triage after irrigating the wound. FINDINGS: BONES AND JOINTS: No acute fracture. No focal osseous lesion. No joint dislocation. No radiopaque foreign body is seen. SOFT TISSUES: Soft tissue swelling / laceration along the fifth metacarpal. IMPRESSION: 1. Soft tissue swelling/laceration along the fifth metacarpal. 2. No radiopaque foreign body. Electronically signed by: Pinkie Pebbles MD 11/17/2023 05:21 PM EDT RP Workstation: [id number]     .Laceration Repair  Date/Time: 11/17/2023 7:17 PM  Performed by: Daralene Lonni BIRCH, PA-C Authorized by: Daralene Lonni BIRCH, PA-C   Consent:    Consent obtained:  Verbal   Consent given by:  Patient   Risks discussed:  Infection, pain, retained foreign body and need for additional repair   Alternatives discussed:  No treatment and  delayed treatment Universal protocol:     Procedure explained and questions answered to patient or proxy's satisfaction: yes     Immediately prior to procedure, a time out was called: yes     Patient identity confirmed:  Verbally with patient, arm band, provided demographic data and hospital-assigned identification number Anesthesia:    Anesthesia method:  Local infiltration   Local anesthetic:  Lidocaine  1% w/o epi Laceration details:    Location:  Hand   Hand location:  L palm   Length (cm):  2 Pre-procedure details:    Preparation:  Patient was prepped and draped in usual sterile fashion and imaging obtained to evaluate for foreign bodies Exploration:    Hemostasis achieved with:  Direct pressure   Imaging obtained: x-ray     Imaging outcome: foreign body not noted     Wound exploration: wound explored through full range of motion and entire depth of wound visualized     Wound extent: no foreign body, no signs of injury, no nerve damage, no tendon damage, no underlying fracture and no vascular damage     Contaminated: no   Treatment:    Area cleansed with:  Povidone-iodine  and saline   Amount of cleaning:  Extensive Skin repair:    Repair method:  Sutures   Suture size:  4-0   Suture material:  Prolene   Suture technique:  Simple interrupted   Number of sutures:  6 Approximation:    Approximation:  Close Repair type:    Repair type:  Simple Post-procedure details:    Dressing:  Antibiotic ointment and non-adherent dressing   Procedure completion:  Tolerated well, no immediate complications    Medications Ordered in the ED  lidocaine  (PF) (XYLOCAINE ) 1 % injection 30 mL (30 mLs Other Given 11/17/23 1830)  Tdap (BOOSTRIX ) injection 0.5 mL (0.5 mLs Intramuscular Given 11/17/23 1832)  povidone-iodine  (BETADINE ) 10 % external solution (1 Application  Given 11/17/23 1830)  polymixin-bacitracin  (POLYSPORIN ) ointment ( Topical Given 11/17/23 1901)                                    Medical Decision Making Patient is  doing well at this time and is stable for discharge home.  Laceration to the webspace between the 1st and 2nd digits was repaired at the bedside.  Wound was not deep enough to warrant deep sutures.  Patient had no indication byline ligamentous or tendon involvement and had full range of motion of the 1st and 2nd digits.  There was no foreign bodies noted within the wound and wound was evaluated to depth with adequate lighting.  Tetanus shot was updated.  He had no other secondary sites of injury or pain.  Discussed the need for good wound care on an outpatient basis.  Discussed the need to return in 10 days to have sutures removed.  Strict return precautions discussed for any new or worsening symptoms and signs of infection were discussed to look for at home.  Patient voiced understanding and had no additional questions.  Amount and/or Complexity of Data Reviewed Radiology: ordered.  Risk OTC drugs. Prescription drug management.        Final diagnoses:  Laceration of left hand without foreign body, initial encounter    ED Discharge Orders          Ordered    cephALEXin  (KEFLEX ) 500 MG capsule  3 times daily  11/17/23 1915               Daralene Lonni BIRCH, PA-C 11/17/23 1919    Dean Clarity, MD 11/17/23 2240

## 2024-01-28 ENCOUNTER — Emergency Department (HOSPITAL_COMMUNITY)
Admission: EM | Admit: 2024-01-28 | Discharge: 2024-01-28 | Disposition: A | Discharge status: Home / Self Care | Attending: Emergency Medicine | Admitting: Emergency Medicine

## 2024-01-28 ENCOUNTER — Other Ambulatory Visit: Payer: Self-pay

## 2024-01-28 ENCOUNTER — Emergency Department (HOSPITAL_COMMUNITY)

## 2024-01-28 ENCOUNTER — Encounter (HOSPITAL_COMMUNITY): Payer: Self-pay | Admitting: *Deleted

## 2024-01-28 DIAGNOSIS — E119 Type 2 diabetes mellitus without complications: Secondary | ICD-10-CM | POA: Insufficient documentation

## 2024-01-28 DIAGNOSIS — Z7902 Long term (current) use of antithrombotics/antiplatelets: Secondary | ICD-10-CM | POA: Insufficient documentation

## 2024-01-28 DIAGNOSIS — I509 Heart failure, unspecified: Secondary | ICD-10-CM | POA: Insufficient documentation

## 2024-01-28 DIAGNOSIS — Z7984 Long term (current) use of oral hypoglycemic drugs: Secondary | ICD-10-CM | POA: Insufficient documentation

## 2024-01-28 DIAGNOSIS — G8929 Other chronic pain: Secondary | ICD-10-CM | POA: Insufficient documentation

## 2024-01-28 DIAGNOSIS — M25561 Pain in right knee: Secondary | ICD-10-CM | POA: Insufficient documentation

## 2024-01-28 DIAGNOSIS — I110 Hypertensive heart disease with heart failure: Secondary | ICD-10-CM | POA: Insufficient documentation

## 2024-01-28 DIAGNOSIS — Z79899 Other long term (current) drug therapy: Secondary | ICD-10-CM | POA: Insufficient documentation

## 2024-01-28 MED ORDER — OXYCODONE-ACETAMINOPHEN 7.5-325 MG PO TABS
1.0000 | ORAL_TABLET | Freq: Once | ORAL | Status: DC
  Filled 2024-01-28: qty 1

## 2024-01-28 MED ORDER — OXYCODONE-ACETAMINOPHEN 5-325 MG PO TABS
1.0000 | ORAL_TABLET | Freq: Once | ORAL | Status: AC
  Administered 2024-01-28: 1 via ORAL
  Filled 2024-01-28: qty 1

## 2024-01-28 MED ORDER — MELOXICAM 15 MG PO TABS: 15.0000 mg | ORAL_TABLET | Freq: Every day | ORAL | 0 refills | Status: AC

## 2024-01-28 NOTE — ED Provider Notes (Signed)
 [place] EMERGENCY DEPARTMENT AT [facility] Provider Note   CSN: [id number] Arrival date & time: 01/28/24  8162     Patient presents with: Knee Pain   Dalton [name] is a 63 y.o. male.  Patient is a 63 year old male with a history of hypertension, type 2 diabetes, CHF, bipolar 1 disorder, and substance abuse who presents to the ED for increasing right knee pain for the past week.  Patient notes he has had knee pain since he was 63 years old after he had an injury where he stick went through the knee.  He states he has always dealt with pain but it has become much more severe in the past week.  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  He states he used to see pain management and was on Percocet 10 mg for chronic back pain.  He notes he was seen in the ED and even 1 week ago and given a few more Percocet but pain has continued.  He notes he has not seen orthopedics recently.  States he has had 2 falls this week where the knee is giving out but denies other specific injury recently.  Denies swelling or color changes.  No further complaints.    Knee Pain Associated symptoms: no fever        Prior to Admission medications   Medication Sig Start Date End Date Taking? Authorizing Provider  meloxicam  (MOBIC ) 15 MG tablet Take 1 tablet (15 mg total) by mouth daily. 01/28/24 02/27/24 Yes Sanna Porcaro, Thersia RAMAN, PA-C  albuterol  (PROVENTIL ) (2.5 MG/3ML) 0.083% nebulizer solution Take 3 mLs (2.5 mg total) by nebulization every 6 (six) hours as needed for wheezing or shortness of breath. 07/08/22   Cleotilde Rogue, MD  albuterol  (VENTOLIN  HFA) [phone] Base) MCG/ACT inhaler Inhale 2 puffs into the lungs every 4 (four) hours as needed for wheezing or shortness of breath. 07/08/22   Cleotilde Rogue, MD  ALPRAZolam  (XANAX ) 1 MG tablet Take 2 mg by mouth 2 (two) times daily as needed for anxiety. 08/01/19   [provider]  atorvastatin  (LIPITOR ) 80 MG tablet Take 80 mg by mouth  at bedtime. 12/05/20   [provider]  azelastine (ASTELIN) 0.1 % nasal spray Place 1 spray into both nostrils 2 (two) times daily. 12/29/21   [provider]  beclomethasone (QVAR ) 80 MCG/ACT inhaler Inhale 2 puffs into the lungs 2 (two) times daily as needed (respiratory issues.).     [provider]  BELSOMRA 15 MG TABS Take 1 tablet by mouth at bedtime. 04/30/21   [provider]  brimonidine (ALPHAGAN P) 0.1 % SOLN Place 1 drop into the right eye 2 (two) times daily. 12/25/20   [provider]  cetirizine  (ZYRTEC ) 10 MG tablet Take 10 mg by mouth daily as needed for allergies.  01/26/16   [provider]  clopidogrel  (PLAVIX ) 75 MG tablet Take 1 tablet (75 mg total) by mouth daily. 08/31/23   Wyn Jackee VEAR Mickey., NP  dexlansoprazole  (DEXILANT ) 60 MG capsule Take 1 capsule (60 mg total) by mouth daily. 07/05/17   Fields, Margo CROME, MD  esomeprazole  (NEXIUM ) 40 MG capsule Take 40 mg by mouth daily. 07/06/22   [provider]  Evolocumab  (REPATHA  SURECLICK) 140 MG/ML SOAJ Take 140 mg by mouth every 14 (fourteen) days. 03/10/22   Alvan Dorn FALCON, MD  ezetimibe  (ZETIA ) 10 MG tablet Take 1 tablet (10 mg total) by mouth daily.  08/31/23   Wyn Jackee VEAR Mickey., NP  fluticasone  (FLONASE ) 50 MCG/ACT nasal spray Place 2 sprays into both nostrils daily.    [provider]  Fluticasone  Furoate (ARNUITY ELLIPTA) 200 MCG/ACT AEPB Inhale 1 puff into the lungs daily.    [provider]  furosemide  (LASIX ) 20 MG tablet Take 1 tablet (20 mg total) by mouth 2 (two) times daily. 08/31/23   Wyn Jackee VEAR Mickey., NP  glipiZIDE  (GLUCOTROL ) 10 MG tablet Take 10 mg by mouth daily before breakfast.    [provider]  isosorbide  mononitrate (IMDUR ) 60 MG 24 hr tablet Take 1.5 tablets (90 mg total) by mouth daily. 08/31/23   Dick, Ernest H Jr., NP  ketoconazole (NIZORAL) 2 % shampoo Apply 1 application topically daily as needed for irritation.  10/23/19    [provider]  latanoprost (XALATAN) 0.005 % ophthalmic solution Place 1 drop into both eyes at bedtime.    [provider]  LINZESS  290 MCG CAPS capsule TAKE 1 CAPSULE(290 MCG) BY MOUTH DAILY BEFORE BREAKFAST 02/04/21   Ezzard Sonny RAMAN, PA-C  lisinopril -hydrochlorothiazide  (ZESTORETIC ) 10-12.5 MG tablet Take 1 tablet by mouth daily. 08/31/23   Wyn Jackee VEAR Mickey., NP  lubiprostone (AMITIZA) 24 MCG capsule Take 24 mcg by mouth 2 (two) times daily. 03/07/23   [provider]  metFORMIN  (GLUCOPHAGE -XR) 500 MG 24 hr tablet Take 1 tablet (500 mg total) by mouth in the morning and at bedtime. 12/04/22   Jordan, Peter M, MD  methocarbamol  (ROBAXIN ) 500 MG tablet Take by mouth as needed for muscle spasms.    [provider]  metoprolol  succinate (TOPROL -XL) 50 MG 24 hr tablet Take 2 tablets (100mg ) by mouth in the morning. Take 1 tablet (50 mg) by mouth at night. 01/04/23   Aniceto Daphne CROME, NP  MOUNJARO 2.5 MG/0.5ML Pen Inject 2.5 mg into the skin once a week. 08/12/23   [provider]  MOVANTIK  25 MG TABS tablet Take 25 mg by mouth every morning. 07/09/22   [provider]  Multiple Vitamin (MULTIVITAMIN WITH MINERALS) TABS tablet Take 1 tablet by mouth daily.    [provider]  NARCAN  4 MG/0.1ML LIQD nasal spray kit Place 1 spray into the nose as needed (opioid overdose).  06/15/17   [provider]  nitroGLYCERIN  (NITROSTAT ) 0.4 MG SL tablet Place 1 tablet (0.4 mg total) under the tongue every 5 (five) minutes as needed for chest pain. 08/26/21   Johnson, Clanford L, MD  omeprazole  (PRILOSEC) 20 MG capsule Take 20 mg by mouth daily. 07/21/22   [provider]  oxyCODONE -acetaminophen  (PERCOCET) 10-325 MG tablet Take 1 tablet by mouth every 4 (four) hours as needed for pain.    [provider]  potassium chloride  (KLOR-CON ) 10 MEQ tablet Take 1 tablet (10 mEq total) by mouth daily for 7 days. 10/31/23 11/07/23  Daralene Lonni BIRCH, PA-C  potassium chloride  SA (KLOR-CON ) 20 MEQ tablet Take 20 mEq by mouth 2 (two) times daily.    [provider]  pregabalin  (LYRICA ) 100 MG capsule Take 100 mg by mouth 2 (two) times daily.    [provider]  sertraline  (ZOLOFT ) 100 MG tablet Take 100 mg by mouth daily.    [provider]  sildenafil (VIAGRA) 100 MG tablet Take by mouth as needed for erectile dysfunction. 03/11/22   [provider]  sucralfate (CARAFATE) 1 g tablet Take 1 g by mouth 3 (three) times daily.    [provider]  tamsulosin  (FLOMAX ) 0.4 MG CAPS capsule Take 1 capsule (0.4 mg total) by mouth daily after breakfast. 01/07/16   Secundino Cones, NP  tiZANidine (ZANAFLEX) 4 MG tablet Take 4 mg by mouth 2 (two) times daily as needed. 06/15/23   [provider]  Vitamin D , Ergocalciferol , (DRISDOL ) 1.25 MG (50000 UT) CAPS capsule Take 50,000 Units by mouth every 7 (seven) days. Monday    [provider]    Allergies: Tirzepatide, Gabapentin, Ibuprofen , Zolpidem tartrate, and Naproxen    Review of Systems  Constitutional:  Negative for fever.  Musculoskeletal:  Positive for arthralgias.  Skin:  Negative for wound.  All other systems reviewed and are negative.   Updated Vital Signs BP (!) 140/97 (BP Location: Right Arm)   Pulse 73   Temp 97.7 F (36.5 C) (Oral)   Resp 17   Ht 5' 10 (1.778 m)   Wt 80.7 kg   SpO2 99%   BMI 25.54 kg/m   Physical Exam Constitutional:      Appearance: Normal appearance.  Cardiovascular:     Pulses: Normal pulses.     Comments: PT pulses 2+ bilaterally Musculoskeletal:     Comments: For range of motion of the right knee with pain on flexion/extension.  Circumferential pain on palpation.  No obvious deformities, erythema, edema, wounds.  Skin:    General: Skin is warm and dry.  Neurological:     Mental Status: He is alert and oriented to person, place, and time.  Psychiatric:        Mood and  Affect: Mood normal.        Behavior: Behavior normal.     (all labs ordered are listed, but only abnormal results are displayed) Labs Reviewed - No data to display  EKG: None  Radiology: DG Knee Complete 4 Views Right Result Date: 01/28/2024 EXAM: 4 OR MORE VIEW(S) XRAY OF THE RIGHT KNEE 01/28/2024 07:19:50 PM COMPARISON: None available. CLINICAL HISTORY: acute pain, no injury FINDINGS: BONES AND JOINTS: No acute fracture. No malalignment. No significant joint effusion. Joint space narrowing and spurring in the patellofemoral compartment. Area of sclerosis in the distal femur compatible with chronic bone infarct. SOFT TISSUES: The soft tissues are unremarkable. IMPRESSION: 1. No acute bony abnormality. Electronically signed by: Franky Crease MD 01/28/2024 07:22 PM EST RP Workstation: [id number]      Medications Ordered in the ED  oxyCODONE -acetaminophen  (PERCOCET/ROXICET) 5-325 MG per tablet 1 tablet (1 tablet Oral Given 01/28/24 1926)                                   Medical Decision Making Patient is a 63 year old male who presents to the ED for increasing right knee pain for the past week.  Of note patient states she has had issues with the right knee since he was 63 years old after an injury.  Please see detailed HPI above.  On exam patient is alert and neurovascular intact.  He is ambulatory.  Physical exam as noted above.  X-ray of the right knee obtained that shows joint space narrowing and some spurring of the patellofemoral compartment consistent with arthritis but otherwise no acute abnormalities.  Patient was given Percocet while in ED for pain with some improvement.  Otherwise well-appearing and ambulatory.  Stable for discharge home.  Prescribed Mobic  for pain.  Advised to follow-up with pain doctor for further narcotic pain medications as needed.  Symptomatic care discussed.  Resources for orthopedic follow-up provided due to ongoing knee pain.  Return precautions provided for  worsening symptoms.  Amount and/or Complexity of Data Reviewed Radiology: ordered.  Risk Prescription drug management.       Final diagnoses:  Chronic pain of right knee    ED Discharge Orders          Ordered    meloxicam  (MOBIC ) 15 MG tablet  Daily        01/28/24 1937               Neysa Thersia GORMAN DEVONNA 01/28/24 1940    Melvenia Motto, MD 01/29/24 [phone]

## 2024-01-28 NOTE — ED Triage Notes (Signed)
 Pt with right knee pain, hx of injury to same knee as child. C/o aching recently and with swelling.

## 2024-01-28 NOTE — Discharge Instructions (Signed)
 Begin taking meloxicam  as prescribed.  Wear over-the-counter sleeve/brace on the knee for support.  Elevate and ice the in the evenings to help with pain/swelling as well.  Please call orthopedics to schedule follow-up for further evaluation management of continued knee pain.  Return to ED if any symptoms worsen including severe uncontrollable pain, new fall/injuries, severe color changes to the leg.

## 2024-01-28 NOTE — ED Notes (Signed)
 Pt/family received d/c paperwork at this time. After going over the paperwork any questions, comments, or concerns were answered to the best of this nurse's knowledge. The pt/family verbally acknowledged the teachings/instructions.

## 2024-03-06 NOTE — Progress Notes (Signed)
 " Cardiology Office Note:  .   Date:  03/06/2024  ID:  Dalton [name], DOB Oct 08, 1960, MRN [id number] PCP: Shelda Atlas, MD  [facility] Providers Cardiologist:  None Electrophysiologist:  Fonda Kitty, MD  Sleep Medicine:  Wilbert Bihari, MD { Chief Complaint: No chief complaint on file.   History of Present Illness: Dalton [name] is a 64 y.o. male with a PMH of CAD s/p 1V CABG (LIMA-LAD, 2002), HFpEF, HTN, HLD, symptomatic PVCs, paroxysmal AT, DM2, L subclavian stenosis s/p stent, prior DVT, bipolar disorder, and prior cocaine use who presents for follow-up.           Pre-Chart Notes: - Last seen in clinic by Jackee Alberts on 08/31/2023 and his antianginals were increased due to intermittent chest pain.  LHC in 2024 showed an atretic LIMA to LAD and otherwise normal coronaries.  Discussed the use of AI scribe software for clinical note transcription with the patient, who gave verbal consent to proceed.  History of Present Illness       Studies Reviewed: SABRA    EKG: ***       Cardiac Studies & Procedures   ______________________________________________________________________________________________ CARDIAC CATHETERIZATION  CARDIAC CATHETERIZATION 12/01/2022  Conclusion   LIMA graft was visualized by angiography.   The left ventricular systolic function is normal.   LV end diastolic pressure is normal.   The left ventricular ejection fraction is 55-65% by visual estimate.  Normal coronary arteries. Atretic LIMA to LAD due to #1 Normal LV function Normal LVEDP Left subclavian stent in place without pressure gradient.  Plan: medical management. Consider other causes of chest pain  Findings Coronary Findings Diagnostic  Dominance: Right  Left Main Vessel was injected. Vessel is normal in caliber. Vessel is angiographically normal.  Left Anterior Descending Vessel was injected. Vessel is normal in caliber. Vessel is angiographically  normal.  Left Circumflex Vessel was injected. Vessel is normal in caliber. Vessel is angiographically normal.  Right Coronary Artery Vessel was injected. Vessel is normal in caliber. Vessel is angiographically normal.  LIMA LIMA Graft To Mid LAD LIMA graft was visualized by angiography.  The graft is atretic.  Intervention  No interventions have been documented.   STRESS TESTS  MYOCARDIAL PERFUSION IMAGING 04/19/2022  Interpretation Summary   The study is normal. The study is low risk.   No ST deviation was noted.   LV perfusion is normal. There is no evidence of ischemia. There is no evidence of infarction.   Left ventricular function is normal. Nuclear stress EF: 47 %. The left ventricular ejection fraction is mildly decreased (45-54%). End diastolic cavity size is normal. End systolic cavity size is normal.   Prior study available for comparison from 05/25/2021.   ECHOCARDIOGRAM  ECHOCARDIOGRAM COMPLETE 05/19/2022  Narrative ECHOCARDIOGRAM REPORT    Patient Name:   Dalton [name] Date of Exam: 05/19/2022 Medical Rec #:  [id number]         Height:       70.0 in Accession #:    [id number]        Weight:       266.1 lb Date of Birth:  14-Dec-1960         BSA:          2.357 m Patient Age:    61 years          BP:           130/82 mmHg Patient Gender: M  HR:           86 bpm. Exam Location:  [facility]  Procedure: 2D Echo, Cardiac Doppler, Color Doppler and Intracardiac Opacification Agent  Indications:    Syncope R55  History:        Patient has prior history of Echocardiogram examinations, most recent 11/10/2021. CHF, CAD and Previous Myocardial Infarction, Signs/Symptoms:Chest Pain and Syncope; Risk Factors:Hypertension, Diabetes, Dyslipidemia and Non-Smoker.  Sonographer:    Madeline Finder Referring Phys: [id number] ASIA B ZIERLE-GHOSH   Sonographer Comments: Image acquisition challenging due to patient body habitus, Image acquisition challenging due  to COPD and Image acquisition challenging due to respiratory motion. IMPRESSIONS   1. Left ventricular ejection fraction, by estimation, is 50 to 55%. The left ventricle has low normal function. The left ventricle has no regional wall motion abnormalities. Left ventricular diastolic parameters are consistent with Grade I diastolic dysfunction (impaired relaxation). 2. Right ventricular systolic function is low normal. The right ventricular size is normal. There is normal pulmonary artery systolic pressure. The estimated right ventricular systolic pressure is 13.6 mmHg. 3. The mitral valve is grossly normal. Trivial mitral valve regurgitation. 4. The aortic valve is tricuspid. Aortic valve regurgitation is not visualized. 5. The inferior vena cava is normal in size with greater than 50% respiratory variability, suggesting right atrial pressure of 3 mmHg.  Comparison(s): Prior images reviewed side by side. LVEF low normal range at 50-55%.  FINDINGS Left Ventricle: Left ventricular ejection fraction, by estimation, is 50 to 55%. The left ventricle has low normal function. The left ventricle has no regional wall motion abnormalities. Definity contrast agent was given IV to delineate the left ventricular endocardial borders. The left ventricular internal cavity size was normal in size. There is borderline left ventricular hypertrophy. Left ventricular diastolic parameters are consistent with Grade I diastolic dysfunction (impaired relaxation).  Right Ventricle: The right ventricular size is normal. No increase in right ventricular wall thickness. Right ventricular systolic function is low normal. There is normal pulmonary artery systolic pressure. The tricuspid regurgitant velocity is 1.63 m/s, and with an assumed right atrial pressure of 3 mmHg, the estimated right ventricular systolic pressure is 13.6 mmHg.  Left Atrium: Left atrial size was normal in size.  Right Atrium: Right atrial size was  normal in size.  Pericardium: There is no evidence of pericardial effusion.  Mitral Valve: The mitral valve is grossly normal. Trivial mitral valve regurgitation.  Tricuspid Valve: The tricuspid valve is grossly normal. Tricuspid valve regurgitation is trivial.  Aortic Valve: The aortic valve is tricuspid. There is mild aortic valve annular calcification. Aortic valve regurgitation is not visualized.  Pulmonic Valve: The pulmonic valve was grossly normal. Pulmonic valve regurgitation is trivial.  Aorta: The aortic root is normal in size and structure.  Venous: The inferior vena cava is normal in size with greater than 50% respiratory variability, suggesting right atrial pressure of 3 mmHg.  IAS/Shunts: No atrial level shunt detected by color flow Doppler.   LEFT VENTRICLE PLAX 2D LVIDd:         4.50 cm   Diastology LVIDs:         3.30 cm   LV e' medial:    7.40 cm/s LV PW:         1.00 cm   LV E/e' medial:  9.2 LV IVS:        0.90 cm   LV e' lateral:   7.72 cm/s LVOT diam:     2.30 cm   LV  E/e' lateral: 8.8 LV SV:         72 LV SV Index:   30 LVOT Area:     4.15 cm   RIGHT VENTRICLE RV S prime:     7.04 cm/s TAPSE (M-mode): 1.2 cm  LEFT ATRIUM             Index        RIGHT ATRIUM           Index LA diam:        4.20 cm 1.78 cm/m   RA Area:     15.60 cm LA Vol (A2C):   54.3 ml 23.04 ml/m  RA Volume:   35.30 ml  14.98 ml/m LA Vol (A4C):   52.4 ml 22.23 ml/m LA Biplane Vol: 54.2 ml 23.00 ml/m AORTIC VALVE             PULMONIC VALVE LVOT Vmax:   94.30 cm/s  PR End Diast Vel: 9.61 msec LVOT Vmean:  60.800 cm/s LVOT VTI:    0.173 m  AORTA Ao Root diam: 3.50 cm Ao Asc diam:  2.70 cm  MITRAL VALVE               TRICUSPID VALVE MV Area (PHT): 4.36 cm    TR Peak grad:   10.6 mmHg MV Decel Time: 174 msec    TR Vmax:        163.00 cm/s MV E velocity: 68.10 cm/s MV A velocity: 78.80 cm/s  SHUNTS MV E/A ratio:  0.86        Systemic VTI:  0.17 m Systemic Diam: 2.30  cm  Dalton Sierras MD Electronically signed by Dalton Sierras MD Signature Date/Time: 05/19/2022/11:06:49 AM    Final    MONITORS  LONG TERM MONITOR (3-14 DAYS) 11/26/2022  Narrative Patch Wear Time:  13 days and 18 hours (2024-09-14T20:49:45-0400 to 2024-09-28T15:22:58-0400)  HR 57 - 190, average 78 bpm. Predominant underlying rhythm was normal sinus. There were frequent supraventricular ectopic beats and non-sustained SVT events with the longest lasting 16.8 seconds. No atrial fibrillation detected. Occasional ventricular ectopy, 1.2%. No symptom trigger episodes.   Fonda Kitty Cardiac Electrophysiology   CT SCANS  CT CORONARY MORPH W/CTA COR W/SCORE 10/18/2019  Addendum 10/18/2019  5:24 PM ADDENDUM REPORT: 10/18/2019 17:21  CLINICAL DATA:  89M with hypertension, hyperlipidemia, diabetes, PAD, prior CABG with atretic LIMA, and prior cocaine abuse with chest pain.  EXAM: Cardiac/Coronary  CT  TECHNIQUE: The patient was scanned on a [employer].  FINDINGS: A 120 kV prospective scan was triggered in the descending thoracic aorta at 111 HU's. Axial non-contrast 3 mm slices were carried out through the heart. The data set was analyzed on a dedicated work station and scored using the Agatson method. Gantry rotation speed was 250 msecs and collimation was .6 mm. No beta blockade and 0.8 mg of sl NTG was given. The 3D data set was reconstructed in 5% intervals of the 67-82 % of the R-R cycle. Diastolic phases were analyzed on a dedicated work station using MPR, MIP and VRT modes. The patient received 80 cc of contrast.  Aorta: Normal size. Ascending aorta 2.7 cm. No calcifications. No dissection.  Aortic Valve:  Trileaflet.  No calcifications.  Coronary Arteries:  Normal coronary origin.  Right dominance.  RCA is a large dominant artery that gives rise to PDA and PLVB. There is no plaque.  Left main is a large artery that gives rise to LAD and  LCX arteries.  LAD is a large vessel that has no plaque. Surgical clips noted near the mid LAD. LIMA is atretic. There is a large D1 without plaque.  LCX is a non-dominant artery that gives rise to a small OM1, large, branching OM2, and a small OM3. There is no plaque.  Other findings:  Normal pulmonary vein drainage into the left atrium.  Normal let atrial appendage without a thrombus.  Normal size of the pulmonary artery.  IMPRESSION: 1. Coronary calcium  score of 0. This was 0 percentile for age and sex matched control.  2. Normal coronary origin with right dominance.  3. No evidence of CAD.  Annabella Scarce, MD   Electronically Signed By: Annabella Scarce On: 10/18/2019 17:21  Narrative EXAM: OVER-READ INTERPRETATION  CT CHEST  The following report is an over-read performed by radiologist Dr. Toribio Aye of [facility] on 10/18/2019. This over-read does not include interpretation of cardiac or coronary anatomy or pathology. The coronary calcium  score/coronary CTA interpretation by the cardiologist is attached.  COMPARISON:  Chest CT 07/26/2013.  FINDINGS: Trace left pleural effusion lying dependently. Within the visualized portions of the thorax there are no suspicious appearing pulmonary nodules or masses, there is no acute consolidative airspace disease, no right pleural effusion, no pneumothorax and no lymphadenopathy. Visualized portions of the upper abdomen are unremarkable. There are no aggressive appearing lytic or blastic lesions noted in the visualized portions of the skeleton. Median sternotomy wires.  IMPRESSION: 1. Trace left pleural effusion lying dependently.  Electronically Signed: By: Toribio Aye M.D. On: 10/18/2019 09:10     ______________________________________________________________________________________________      Results   Risk Assessment/Calculations:    {Does this patient have ATRIAL  FIBRILLATION?[phone]} No BP recorded.  {Refresh Note OR Click here to enter BP  :1}***   STOP-Bang Score:     { Consider Dx Sleep Disordered Breathing or Sleep Apnea  ICD G47.33          :1}     Physical Exam:    VS:  There were no vitals taken for this visit. ***    Wt Readings from Last 3 Encounters:  01/28/24 178 lb (80.7 kg)  11/17/23 178 lb (80.7 kg)  10/31/23 178 lb (80.7 kg)     GEN: Well nourished, well developed, in no acute distress NECK: No JVD; No carotid bruits CARDIAC: ***RRR, no murmurs, rubs, gallops RESPIRATORY:  Clear to auscultation without rales, wheezing or rhonchi  ABDOMEN: Soft, non-tender, non-distended, normal bowel sounds EXTREMITIES:  Warm and well perfused, no edema; No deformity, 2+ radial pulses PSYCH: Normal mood and affect   ASSESSMENT AND PLAN: .    Assessment and Plan  #CAD s/p CABG 1V (LIMA-LAD, 2002) Continue Plavix  monotherapy  #HFpEF   #HTN   #HLD Lipid panel  #Symptomatic PVCs #Paroxsymal AT Continue EP follow-up Assessment & Plan        {Are you ordering a CV Procedure (e.g. stress test, cath, DCCV, TEE, etc)?   Press F2        :[id number]}    This note was written with the assistance of a dictation microphone or AI dictation software. Please excuse any typos or grammatical errors.   Signed, Georganna Archer, MD  03/06/2024 12:52 PM    [facility] "

## 2024-03-07 ENCOUNTER — Other Ambulatory Visit (HOSPITAL_COMMUNITY): Payer: Self-pay

## 2024-03-07 ENCOUNTER — Encounter: Payer: Self-pay | Admitting: Student in an Organized Health Care Education/Training Program

## 2024-03-07 ENCOUNTER — Ambulatory Visit: Attending: Cardiology | Admitting: Student in an Organized Health Care Education/Training Program

## 2024-03-07 VITALS — BP 157/80 | HR 56 | Ht 70.0 in | Wt 245.0 lb

## 2024-03-07 DIAGNOSIS — R072 Precordial pain: Secondary | ICD-10-CM

## 2024-03-07 DIAGNOSIS — I2510 Atherosclerotic heart disease of native coronary artery without angina pectoris: Secondary | ICD-10-CM

## 2024-03-07 MED ORDER — REPATHA SURECLICK 140 MG/ML ~~LOC~~ SOAJ
140.0000 mg | SUBCUTANEOUS | 6 refills | Status: AC
  Filled 2024-03-07: qty 2, 28d supply, fill #0

## 2024-03-07 MED ORDER — EZETIMIBE 10 MG PO TABS
10.0000 mg | ORAL_TABLET | Freq: Every day | ORAL | 3 refills | Status: AC
  Filled 2024-03-07: qty 90, 90d supply, fill #0

## 2024-03-07 MED ORDER — CLOPIDOGREL BISULFATE 75 MG PO TABS
75.0000 mg | ORAL_TABLET | Freq: Every day | ORAL | 3 refills | Status: AC
  Filled 2024-03-07: qty 90, 90d supply, fill #0

## 2024-03-07 MED ORDER — AMLODIPINE BESYLATE 5 MG PO TABS
5.0000 mg | ORAL_TABLET | Freq: Every day | ORAL | 3 refills | Status: AC
  Filled 2024-03-07: qty 90, 90d supply, fill #0

## 2024-03-07 MED ORDER — ISOSORBIDE MONONITRATE ER 60 MG PO TB24
90.0000 mg | ORAL_TABLET | Freq: Every day | ORAL | 3 refills | Status: DC
  Filled 2024-03-07: qty 135, 90d supply, fill #0

## 2024-03-07 MED ORDER — FUROSEMIDE 20 MG PO TABS
20.0000 mg | ORAL_TABLET | Freq: Two times a day (BID) | ORAL | 3 refills | Status: AC
  Filled 2024-03-07: qty 180, 90d supply, fill #0

## 2024-03-07 MED ORDER — ATORVASTATIN CALCIUM 80 MG PO TABS
80.0000 mg | ORAL_TABLET | Freq: Every evening | ORAL | 3 refills | Status: AC
  Filled 2024-03-07: qty 90, 90d supply, fill #0

## 2024-03-07 MED ORDER — OMRON 3 SERIES BP MONITOR DEVI
0 refills | Status: AC
  Filled 2024-03-07: qty 1, 30d supply, fill #0

## 2024-03-07 MED ORDER — LISINOPRIL-HYDROCHLOROTHIAZIDE 10-12.5 MG PO TABS
1.0000 | ORAL_TABLET | Freq: Every day | ORAL | 3 refills | Status: AC
  Filled 2024-03-07: qty 90, 90d supply, fill #0

## 2024-03-07 MED ORDER — METOPROLOL SUCCINATE ER 50 MG PO TB24
ORAL_TABLET | ORAL | 3 refills | Status: AC
  Filled 2024-03-07: qty 270, 90d supply, fill #0

## 2024-03-07 NOTE — Patient Instructions (Addendum)
 Medication Instructions:  Refills sent in today STOP Omeprazole  and Nexium   STOP Imdur    START Amlodipine  5 mg daily   *If you need a refill on your cardiac medications before your next appointment, please call your pharmacy*  Lab Work: Cmp Lipid panel  If you have labs (blood work) drawn today and your tests are completely normal, you will receive your results only by: MyChart Message (if you have MyChart) OR A paper copy in the mail If you have any lab test that is abnormal or we need to change your treatment, we will call you to review the results.  Testing/Procedures: PET/CT  How to Prepare for Your Cardiac PET/CT Stress Test:  1. Please do not take these medications before your test:  ~Medications that may interfere with the cardiac pharmacological stress agent (ex. nitrates - including erectile dysfunction medications, isosorbide  mononitrate, tamulosin or beta-blockers- Metoprolol ) the day of the exam. (Erectile dysfunction medication should be held for at least 72 hrs prior to test) ~Theophylline containing medications for 12 hours. ~Dipyridamole 48 hours prior to the test. ~Your remaining medications may be taken with water .  2. Nothing to eat or drink, except water , 3 hours prior to arrival time.   ~ NO caffeine/decaffeinated products, or chocolate 12 hours prior to arrival.  3. NO perfume, cologne or lotion on chest or abdomen area.         - FEMALES - Please avoid wearing dresses to this appointment.  4. Total time is 1 to 2 hours; you may want to bring reading material for the waiting time.  Please report to Radiology at the [facility] Main Entrance 30 minutes early for your test. [address] [place], [place] [address]  Diabetic Preparation: - Hold oral medications. - You may take NPH and Lantus insulin . - Do not take Humalog or Humulin R  (Regular Insulin ) the day of your test. - Check blood sugars prior to leaving the house. - If able to eat  breakfast prior to 3 hour fasting, you may take all medications, including your insulin , - Do not worry if you miss your breakfast dose of insulin  - start at your next meal. - Patients who wear a continuous glucose monitor MUST remove the device prior to scanning.  IF YOU THINK YOU MAY BE PREGNANT, OR ARE NURSING PLEASE INFORM THE TECHNOLOGIST.  In preparation for your appointment, medication and supplies will be purchased.  Appointment availability is limited, so if you need to cancel or reschedule, please call the Radiology Department at [phone] [facility] Law) OR [phone] [facility])  24 hours in advance to avoid a cancellation fee of $100.00  What to Expect After you Arrive:  Once you arrive and check in for your appointment, you will be taken to a preparation room within the Radiology Department.  A technologist or Nurse will obtain your medical history, verify that you are correctly prepped for the exam, and explain the procedure.  Afterwards,  an IV will be started in your arm and electrodes will be placed on your skin for EKG monitoring during the stress portion of the exam. Then you will be escorted to the PET/CT scanner.  There, staff will get you positioned on the scanner and obtain a blood pressure and EKG.  During the exam, you will continue to be connected to the EKG and blood pressure machines.  A small, safe amount of a radioactive tracer will be injected in your IV to obtain a series of pictures of your heart  along with an injection of a stress agent.    After your Exam:  It is recommended that you eat a meal and drink a caffeinated beverage to counter act any effects of the stress agent.  Drink plenty of fluids for the remainder of the day and urinate frequently for the first couple of hours after the exam.  Your doctor will inform you of your test results within 7-10 business days.  For more information and frequently asked questions, please visit our website :  [email or web address]  For questions about your test or how to prepare for your test, please call: Cardiac Imaging Nurse Navigators Office: [phone]   CHECK BLOOD PRESSURE TWICE DAILY FOR 2 WEEKS AND ADVISE RESULTS WHEN COMPLETED   Blood Pressure Record Sheet To take your blood pressure, you will need a blood pressure machine. You can buy a blood pressure machine (blood pressure monitor) at your clinic, drug store, or online. When choosing one, consider: An automatic monitor that has an arm cuff. A cuff that wraps snugly around your upper arm. You should be able to fit only one finger between your arm and the cuff. A device that stores blood pressure reading results. Do not choose a monitor that measures your blood pressure from your wrist or finger. Follow your health care provider's instructions for how to take your blood pressure. To use this form: Take your blood pressure medications every day These measurements should be taken when you have been at rest for at least 10-15 min Take at least 2 readings with each blood pressure check. This makes sure the results are correct. Wait 1-2 minutes between measurements. Write down the results in the spaces on this form. Keep in mind it should always be recorded systolic over diastolic. Both numbers are important.  Repeat this every day for 2-3 weeks, or as told by your health care provider.  Make a follow-up appointment with your health care provider to discuss the results.  Blood Pressure Log Date Medications taken? (Y/N) Blood Pressure Time of Day                                                                                                         Follow-Up: At [facility], you and your health needs are our priority.  As part of our continuing mission to provide you with exceptional heart care, our providers are all part of one team.  This team includes your primary Cardiologist (physician)  and Advanced Practice Providers or APPs (Physician Assistants and Nurse Practitioners) who all work together to provide you with the care you need, when you need it.  Your next appointment:   6 month(s)  Provider:   Georganna Archer, MD

## 2024-03-07 NOTE — Addendum Note (Signed)
 Addended by: MANDA BOTTCHER B on: 03/07/2024 09:18 AM   Modules accepted: Orders

## 2024-03-08 LAB — LIPID PANEL
Chol/HDL Ratio: 2.9 ratio (ref 0.0–5.0)
Cholesterol, Total: 171 mg/dL (ref 100–199)
HDL: 59 mg/dL
LDL Chol Calc (NIH): 96 mg/dL (ref 0–99)
Triglycerides: 87 mg/dL (ref 0–149)
VLDL Cholesterol Cal: 16 mg/dL (ref 5–40)

## 2024-03-08 LAB — COMPREHENSIVE METABOLIC PANEL WITH GFR
ALT: 16 IU/L (ref 0–44)
AST: 21 IU/L (ref 0–40)
Albumin: 4.4 g/dL (ref 3.9–4.9)
Alkaline Phosphatase: 92 IU/L (ref 47–123)
BUN/Creatinine Ratio: 8 — ABNORMAL LOW (ref 10–24)
BUN: 6 mg/dL — ABNORMAL LOW (ref 8–27)
Bilirubin Total: 0.5 mg/dL (ref 0.0–1.2)
CO2: 27 mmol/L (ref 20–29)
Calcium: 9 mg/dL (ref 8.6–10.2)
Chloride: 101 mmol/L (ref 96–106)
Creatinine, Ser: 0.77 mg/dL (ref 0.76–1.27)
Globulin, Total: 2.7 g/dL (ref 1.5–4.5)
Glucose: 83 mg/dL (ref 70–99)
Potassium: 3.7 mmol/L (ref 3.5–5.2)
Sodium: 142 mmol/L (ref 134–144)
Total Protein: 7.1 g/dL (ref 6.0–8.5)
eGFR: 101 mL/min/1.73

## 2024-03-16 ENCOUNTER — Ambulatory Visit: Payer: Self-pay | Admitting: Student in an Organized Health Care Education/Training Program

## 2024-03-20 ENCOUNTER — Other Ambulatory Visit (HOSPITAL_COMMUNITY): Payer: Self-pay

## 2024-04-04 ENCOUNTER — Encounter (HOSPITAL_COMMUNITY)
